# Patient Record
Sex: Female | Born: 1950 | Race: White | Hispanic: No | Marital: Married | Smoking: Never smoker
Health system: Southern US, Community
[De-identification: ages and names within clinical notes are randomized; demographics above are authoritative.]

## PROBLEM LIST (undated history)

## (undated) DIAGNOSIS — Z860100 Personal history of colon polyps, unspecified: Secondary | ICD-10-CM

## (undated) DIAGNOSIS — Z960 Presence of urogenital implants: Secondary | ICD-10-CM

## (undated) DIAGNOSIS — E782 Mixed hyperlipidemia: Secondary | ICD-10-CM

## (undated) DIAGNOSIS — N3941 Urge incontinence: Secondary | ICD-10-CM

## (undated) DIAGNOSIS — R112 Nausea with vomiting, unspecified: Secondary | ICD-10-CM

## (undated) DIAGNOSIS — D509 Iron deficiency anemia, unspecified: Secondary | ICD-10-CM

## (undated) DIAGNOSIS — S62109A Fracture of unspecified carpal bone, unspecified wrist, initial encounter for closed fracture: Secondary | ICD-10-CM

## (undated) DIAGNOSIS — I10 Essential (primary) hypertension: Secondary | ICD-10-CM

## (undated) DIAGNOSIS — R51 Headache: Secondary | ICD-10-CM

## (undated) DIAGNOSIS — K449 Diaphragmatic hernia without obstruction or gangrene: Secondary | ICD-10-CM

## (undated) DIAGNOSIS — I639 Cerebral infarction, unspecified: Secondary | ICD-10-CM

## (undated) DIAGNOSIS — H269 Unspecified cataract: Secondary | ICD-10-CM

## (undated) DIAGNOSIS — G8929 Other chronic pain: Secondary | ICD-10-CM

## (undated) DIAGNOSIS — K5790 Diverticulosis of intestine, part unspecified, without perforation or abscess without bleeding: Secondary | ICD-10-CM

## (undated) DIAGNOSIS — M1990 Unspecified osteoarthritis, unspecified site: Secondary | ICD-10-CM

## (undated) DIAGNOSIS — N393 Stress incontinence (female) (male): Secondary | ICD-10-CM

## (undated) DIAGNOSIS — K529 Noninfective gastroenteritis and colitis, unspecified: Secondary | ICD-10-CM

## (undated) DIAGNOSIS — N814 Uterovaginal prolapse, unspecified: Secondary | ICD-10-CM

## (undated) DIAGNOSIS — M8580 Other specified disorders of bone density and structure, unspecified site: Secondary | ICD-10-CM

## (undated) DIAGNOSIS — G4730 Sleep apnea, unspecified: Secondary | ICD-10-CM

## (undated) DIAGNOSIS — J302 Other seasonal allergic rhinitis: Secondary | ICD-10-CM

## (undated) DIAGNOSIS — N812 Incomplete uterovaginal prolapse: Secondary | ICD-10-CM

## (undated) DIAGNOSIS — G709 Myoneural disorder, unspecified: Secondary | ICD-10-CM

## (undated) DIAGNOSIS — Z9889 Other specified postprocedural states: Secondary | ICD-10-CM

## (undated) DIAGNOSIS — E7800 Pure hypercholesterolemia, unspecified: Secondary | ICD-10-CM

## (undated) DIAGNOSIS — M6740 Ganglion, unspecified site: Secondary | ICD-10-CM

## (undated) DIAGNOSIS — N3281 Overactive bladder: Secondary | ICD-10-CM

## (undated) DIAGNOSIS — K219 Gastro-esophageal reflux disease without esophagitis: Secondary | ICD-10-CM

## (undated) DIAGNOSIS — M549 Dorsalgia, unspecified: Secondary | ICD-10-CM

## (undated) DIAGNOSIS — K52839 Microscopic colitis, unspecified: Secondary | ICD-10-CM

## (undated) DIAGNOSIS — Z8719 Personal history of other diseases of the digestive system: Secondary | ICD-10-CM

## (undated) DIAGNOSIS — M12819 Other specific arthropathies, not elsewhere classified, unspecified shoulder: Secondary | ICD-10-CM

## (undated) HISTORY — DX: Other specified disorders of bone density and structure, unspecified site: M85.80

## (undated) HISTORY — PX: LUMBAR SPINE SURGERY: SHX701

## (undated) HISTORY — PX: TUBAL LIGATION: SHX77

## (undated) HISTORY — DX: Personal history of colon polyps, unspecified: Z86.0100

## (undated) HISTORY — DX: Cerebral infarction, unspecified: I63.9

## (undated) HISTORY — DX: Essential (primary) hypertension: I10

## (undated) HISTORY — PX: BACK SURGERY: SHX140

## (undated) HISTORY — PX: CATARACT EXTRACTION: SUR2

## (undated) HISTORY — PX: GUM SURGERY: SHX658

## (undated) HISTORY — PX: FINGER SURGERY: SHX640

## (undated) HISTORY — DX: Diverticulosis of intestine, part unspecified, without perforation or abscess without bleeding: K57.90

## (undated) HISTORY — PX: COLONOSCOPY: SHX174

## (undated) HISTORY — DX: Microscopic colitis, unspecified: K52.839

## (undated) HISTORY — DX: Pure hypercholesterolemia, unspecified: E78.00

## (undated) HISTORY — DX: Unspecified osteoarthritis, unspecified site: M19.90

## (undated) HISTORY — PX: OTHER SURGICAL HISTORY: SHX169

## (undated) HISTORY — DX: Personal history of colonic polyps: Z86.010

## (undated) HISTORY — PX: GYNECOLOGIC CRYOSURGERY: SHX857

## (undated) HISTORY — DX: Gastro-esophageal reflux disease without esophagitis: K21.9

## (undated) HISTORY — PX: JOINT REPLACEMENT: SHX530

## (undated) HISTORY — DX: Fracture of unspecified carpal bone, unspecified wrist, initial encounter for closed fracture: S62.109A

## (undated) HISTORY — DX: Unspecified cataract: H26.9

---

## 1970-04-11 HISTORY — PX: NASAL SEPTUM SURGERY: SHX37

## 1996-04-11 DIAGNOSIS — Z8673 Personal history of transient ischemic attack (TIA), and cerebral infarction without residual deficits: Secondary | ICD-10-CM

## 1996-04-11 DIAGNOSIS — I639 Cerebral infarction, unspecified: Secondary | ICD-10-CM

## 1996-04-11 HISTORY — DX: Personal history of transient ischemic attack (TIA), and cerebral infarction without residual deficits: Z86.73

## 1996-04-11 HISTORY — DX: Cerebral infarction, unspecified: I63.9

## 1997-07-14 ENCOUNTER — Other Ambulatory Visit: Admission: RE | Admit: 1997-07-14 | Discharge: 1997-07-14 | Payer: Self-pay | Admitting: Obstetrics and Gynecology

## 1998-03-19 ENCOUNTER — Ambulatory Visit (HOSPITAL_COMMUNITY): Admission: RE | Admit: 1998-03-19 | Discharge: 1998-03-19 | Payer: Self-pay

## 1998-03-27 ENCOUNTER — Encounter: Payer: Self-pay | Admitting: Neurosurgery

## 1998-03-27 ENCOUNTER — Inpatient Hospital Stay (HOSPITAL_COMMUNITY): Admission: RE | Admit: 1998-03-27 | Discharge: 1998-03-28 | Payer: Self-pay | Admitting: Neurosurgery

## 1998-04-11 HISTORY — PX: OOPHORECTOMY: SHX86

## 1998-12-18 ENCOUNTER — Encounter (INDEPENDENT_AMBULATORY_CARE_PROVIDER_SITE_OTHER): Payer: Self-pay | Admitting: Specialist

## 1998-12-18 ENCOUNTER — Observation Stay (HOSPITAL_COMMUNITY): Admission: RE | Admit: 1998-12-18 | Discharge: 1998-12-19 | Payer: Self-pay | Admitting: Obstetrics and Gynecology

## 1999-04-12 HISTORY — PX: OOPHORECTOMY: SHX86

## 1999-04-22 ENCOUNTER — Encounter: Admission: RE | Admit: 1999-04-22 | Discharge: 1999-04-22 | Payer: Self-pay | Admitting: Neurosurgery

## 1999-04-22 ENCOUNTER — Encounter: Payer: Self-pay | Admitting: Neurosurgery

## 1999-07-31 ENCOUNTER — Ambulatory Visit (HOSPITAL_COMMUNITY): Admission: RE | Admit: 1999-07-31 | Discharge: 1999-07-31 | Payer: Self-pay | Admitting: Neurology

## 1999-07-31 ENCOUNTER — Encounter: Payer: Self-pay | Admitting: Neurology

## 2001-06-29 ENCOUNTER — Encounter: Admission: RE | Admit: 2001-06-29 | Discharge: 2001-09-27 | Payer: Self-pay | Admitting: Neurosurgery

## 2002-01-24 ENCOUNTER — Ambulatory Visit (HOSPITAL_COMMUNITY): Admission: RE | Admit: 2002-01-24 | Discharge: 2002-01-24 | Payer: Self-pay | Admitting: Pulmonary Disease

## 2002-01-24 ENCOUNTER — Encounter: Payer: Self-pay | Admitting: Pulmonary Disease

## 2003-02-11 ENCOUNTER — Ambulatory Visit (HOSPITAL_COMMUNITY): Admission: RE | Admit: 2003-02-11 | Discharge: 2003-02-11 | Payer: Self-pay | Admitting: Gastroenterology

## 2004-02-16 ENCOUNTER — Ambulatory Visit: Payer: Self-pay | Admitting: Pulmonary Disease

## 2004-02-25 ENCOUNTER — Ambulatory Visit: Payer: Self-pay | Admitting: Pulmonary Disease

## 2004-06-09 DIAGNOSIS — K219 Gastro-esophageal reflux disease without esophagitis: Secondary | ICD-10-CM

## 2004-06-09 HISTORY — DX: Gastro-esophageal reflux disease without esophagitis: K21.9

## 2004-06-17 ENCOUNTER — Ambulatory Visit: Payer: Self-pay | Admitting: Pulmonary Disease

## 2004-06-22 ENCOUNTER — Ambulatory Visit (HOSPITAL_COMMUNITY): Admission: RE | Admit: 2004-06-22 | Discharge: 2004-06-22 | Payer: Self-pay | Admitting: Pulmonary Disease

## 2004-06-24 ENCOUNTER — Ambulatory Visit: Payer: Self-pay | Admitting: Gastroenterology

## 2004-06-28 ENCOUNTER — Ambulatory Visit: Payer: Self-pay | Admitting: Gastroenterology

## 2004-08-09 ENCOUNTER — Ambulatory Visit: Payer: Self-pay | Admitting: Gastroenterology

## 2004-12-21 ENCOUNTER — Ambulatory Visit: Payer: Self-pay | Admitting: Pulmonary Disease

## 2005-06-21 ENCOUNTER — Ambulatory Visit: Payer: Self-pay | Admitting: Pulmonary Disease

## 2005-06-29 ENCOUNTER — Ambulatory Visit: Payer: Self-pay | Admitting: Pulmonary Disease

## 2005-12-05 ENCOUNTER — Ambulatory Visit: Payer: Self-pay | Admitting: Pulmonary Disease

## 2005-12-14 ENCOUNTER — Ambulatory Visit: Payer: Self-pay | Admitting: Pulmonary Disease

## 2006-02-23 ENCOUNTER — Ambulatory Visit: Payer: Self-pay | Admitting: Specialist

## 2006-04-11 HISTORY — PX: KNEE ARTHROSCOPY: SUR90

## 2006-04-27 ENCOUNTER — Ambulatory Visit: Payer: Self-pay | Admitting: Pulmonary Disease

## 2006-06-05 ENCOUNTER — Ambulatory Visit: Payer: Self-pay | Admitting: Pulmonary Disease

## 2006-07-07 ENCOUNTER — Ambulatory Visit: Payer: Self-pay | Admitting: Specialist

## 2006-07-12 ENCOUNTER — Ambulatory Visit: Payer: Self-pay | Admitting: Specialist

## 2006-07-12 ENCOUNTER — Other Ambulatory Visit: Payer: Self-pay

## 2006-07-19 ENCOUNTER — Ambulatory Visit: Payer: Self-pay | Admitting: Specialist

## 2006-12-04 ENCOUNTER — Ambulatory Visit: Payer: Self-pay | Admitting: Pulmonary Disease

## 2007-01-05 ENCOUNTER — Ambulatory Visit: Payer: Self-pay | Admitting: Pulmonary Disease

## 2007-01-05 LAB — CONVERTED CEMR LAB
AST: 48 units/L — ABNORMAL HIGH (ref 0–37)
BUN: 16 mg/dL (ref 6–23)
Bilirubin, Direct: 0.1 mg/dL (ref 0.0–0.3)
Calcium: 9.8 mg/dL (ref 8.4–10.5)
Glucose, Bld: 114 mg/dL — ABNORMAL HIGH (ref 70–99)
HDL: 30.9 mg/dL — ABNORMAL LOW (ref >39.0)
Hgb A1c MFr Bld: 6.2 % — ABNORMAL HIGH (ref 4.6–6.0)
Sodium: 143 meq/L (ref 135–145)
Total Bilirubin: 1 mg/dL (ref 0.3–1.2)
Total CHOL/HDL Ratio: 4.8
Total Protein: 6.9 g/dL (ref 6.0–8.3)

## 2007-01-08 DIAGNOSIS — I872 Venous insufficiency (chronic) (peripheral): Secondary | ICD-10-CM | POA: Insufficient documentation

## 2007-01-08 DIAGNOSIS — F411 Generalized anxiety disorder: Secondary | ICD-10-CM | POA: Insufficient documentation

## 2007-01-08 DIAGNOSIS — M722 Plantar fascial fibromatosis: Secondary | ICD-10-CM

## 2007-01-08 DIAGNOSIS — K219 Gastro-esophageal reflux disease without esophagitis: Secondary | ICD-10-CM

## 2007-01-08 DIAGNOSIS — I10 Essential (primary) hypertension: Secondary | ICD-10-CM

## 2007-01-08 DIAGNOSIS — M1990 Unspecified osteoarthritis, unspecified site: Secondary | ICD-10-CM | POA: Insufficient documentation

## 2007-02-16 ENCOUNTER — Ambulatory Visit: Payer: Self-pay | Admitting: Pulmonary Disease

## 2007-02-17 DIAGNOSIS — E785 Hyperlipidemia, unspecified: Secondary | ICD-10-CM | POA: Insufficient documentation

## 2007-02-17 DIAGNOSIS — D126 Benign neoplasm of colon, unspecified: Secondary | ICD-10-CM | POA: Insufficient documentation

## 2007-03-16 ENCOUNTER — Ambulatory Visit: Payer: Self-pay | Admitting: Pulmonary Disease

## 2007-06-04 ENCOUNTER — Ambulatory Visit: Payer: Self-pay | Admitting: Pulmonary Disease

## 2007-06-06 ENCOUNTER — Ambulatory Visit: Payer: Self-pay | Admitting: Pulmonary Disease

## 2007-06-15 ENCOUNTER — Telehealth (INDEPENDENT_AMBULATORY_CARE_PROVIDER_SITE_OTHER): Payer: Self-pay | Admitting: *Deleted

## 2007-06-20 ENCOUNTER — Ambulatory Visit: Payer: Self-pay | Admitting: Pulmonary Disease

## 2007-06-26 ENCOUNTER — Telehealth (INDEPENDENT_AMBULATORY_CARE_PROVIDER_SITE_OTHER): Payer: Self-pay | Admitting: *Deleted

## 2007-08-29 ENCOUNTER — Telehealth: Payer: Self-pay | Admitting: Gastroenterology

## 2007-09-12 ENCOUNTER — Encounter: Payer: Self-pay | Admitting: Pulmonary Disease

## 2007-09-21 ENCOUNTER — Ambulatory Visit: Payer: Self-pay | Admitting: Gastroenterology

## 2007-09-21 ENCOUNTER — Encounter: Payer: Self-pay | Admitting: Pulmonary Disease

## 2007-09-25 ENCOUNTER — Ambulatory Visit: Payer: Self-pay | Admitting: Pulmonary Disease

## 2007-09-28 ENCOUNTER — Encounter: Payer: Self-pay | Admitting: Gastroenterology

## 2007-09-28 ENCOUNTER — Ambulatory Visit: Payer: Self-pay | Admitting: Gastroenterology

## 2007-09-30 DIAGNOSIS — R930 Abnormal findings on diagnostic imaging of skull and head, not elsewhere classified: Secondary | ICD-10-CM | POA: Insufficient documentation

## 2007-10-02 ENCOUNTER — Encounter: Payer: Self-pay | Admitting: Gastroenterology

## 2008-03-21 ENCOUNTER — Telehealth (INDEPENDENT_AMBULATORY_CARE_PROVIDER_SITE_OTHER): Payer: Self-pay | Admitting: *Deleted

## 2008-03-26 ENCOUNTER — Ambulatory Visit: Payer: Self-pay | Admitting: Pulmonary Disease

## 2008-03-26 ENCOUNTER — Encounter: Payer: Self-pay | Admitting: Adult Health

## 2008-04-14 ENCOUNTER — Telehealth (INDEPENDENT_AMBULATORY_CARE_PROVIDER_SITE_OTHER): Payer: Self-pay | Admitting: *Deleted

## 2008-09-24 ENCOUNTER — Ambulatory Visit: Payer: Self-pay | Admitting: Obstetrics and Gynecology

## 2008-12-09 ENCOUNTER — Ambulatory Visit: Payer: Self-pay | Admitting: Pulmonary Disease

## 2009-03-09 ENCOUNTER — Telehealth: Payer: Self-pay | Admitting: Pulmonary Disease

## 2009-04-20 ENCOUNTER — Telehealth: Payer: Self-pay | Admitting: Pulmonary Disease

## 2009-04-20 ENCOUNTER — Ambulatory Visit: Payer: Self-pay | Admitting: Pulmonary Disease

## 2009-06-09 ENCOUNTER — Ambulatory Visit: Payer: Self-pay | Admitting: Pulmonary Disease

## 2009-06-30 ENCOUNTER — Telehealth (INDEPENDENT_AMBULATORY_CARE_PROVIDER_SITE_OTHER): Payer: Self-pay | Admitting: *Deleted

## 2009-09-25 ENCOUNTER — Ambulatory Visit: Payer: Self-pay | Admitting: Obstetrics and Gynecology

## 2009-10-13 ENCOUNTER — Ambulatory Visit: Payer: Self-pay | Admitting: Obstetrics and Gynecology

## 2009-11-11 ENCOUNTER — Ambulatory Visit: Payer: Self-pay | Admitting: Obstetrics and Gynecology

## 2009-11-16 ENCOUNTER — Ambulatory Visit: Payer: Self-pay | Admitting: Pulmonary Disease

## 2009-11-16 ENCOUNTER — Telehealth (INDEPENDENT_AMBULATORY_CARE_PROVIDER_SITE_OTHER): Payer: Self-pay | Admitting: *Deleted

## 2009-11-17 DIAGNOSIS — R51 Headache: Secondary | ICD-10-CM

## 2009-11-17 DIAGNOSIS — R519 Headache, unspecified: Secondary | ICD-10-CM | POA: Insufficient documentation

## 2009-12-07 ENCOUNTER — Ambulatory Visit: Payer: Self-pay | Admitting: Pulmonary Disease

## 2009-12-23 ENCOUNTER — Telehealth: Payer: Self-pay | Admitting: Pulmonary Disease

## 2009-12-23 DIAGNOSIS — M25519 Pain in unspecified shoulder: Secondary | ICD-10-CM

## 2010-05-11 NOTE — Assessment & Plan Note (Signed)
Summary: Acute NP office visit - recent fall   CC:  fell on Thursday at home and hit back of head and left shoulder.  having progressing dull HA and nausea.  History of Present Illness: 60  y/o WF   -06/20/07 for a routine appt, she was c/o cough, congestion, and yellow sputum... eval revealed a bronchitic infection and she was treated w/ Avelox & Medrol... Improved, but she is exposed to second hand smoke from her husb... CXR revealed a 56m RUL nodule and f/u film in 3 months was rec...   6/09--seen for follow up. CXR without change.   March 26, 2008 --returns for follow up and fasitng labs. Recent cold symptoms. started on zpack. finished yesterday. Feeling better, still has some drainage. yellow mucus.     ~  June 09, 2009:  routine 646mo/u doing well- had recent sinus infection Rx w/ Avelox, Astepro per ENT... hx sm nodule w/ no progression serially & today's CXR similar (can't see it)... BP controlled on meds;  Chol has been OK on diet + Simva20;  hasn't been effective w/ diet/ ex "I'm working on it";  mult somatic complaints improved by regular massage therapy..  November 16, 2009 --Pt presents for work in visit for headache. Pt fell on 4 days ago  home, hit back of head and left shoulder on edge of counter and then landed on flooor. Complains of  having progressing dull HA and nausea. Was standing on step stool, went to step down missed step fell over into counter then onto floor. She did not lose consciousness. No visual/speech changes, or ext weakness.  Complains of soreness along posterior scalp. Skin intact. She has gotten couple of massages and went to chiropractor today. Denies chest pain, dyspnea, orthopnea, hemoptysis, fever, n/v/d, edema, headache,recent travel.       Medications Prior to Update: 1)  Aspir-Low 81 Mg Tbec (Aspirin) ...Marland Kitchen 1 Tablet By Mouth Two Times A Day 2)  Atenolol-Chlorthalidone 50-25 Mg Tabs (Atenolol-Chlorthalidone) .... Take 1 Tab By Mouth Once  Daily...Marland KitchenMarland Kitchen)  Zestril 2.5 Mg  Tabs (Lisinopril) .... Take 1 Tab By Mouth Once Daily...Marland KitchenMarland Kitchen)  Klor-Con M20 20 Meq Tbcr (Potassium Chloride Crys Cr) ...Marland Kitchen 1 Tab By Mouth Two Times A Day 5)  Simvastatin 20 Mg  Tabs (Simvastatin) .... Take 1 Tab By Mouth At Bedtime...Marland KitchenMarland Kitchen)  Omeprazole 40 Mg Cpdr (Omeprazole) .... Take 1 Tablet By Mouth Once A Day 7)  Etodolac 400 Mg Tabs (Etodolac) ...Marland Kitchen 1 By Mouth Two Times A Day 8)  Calcium-Vitamin D 500-125 Mg-Unit Tabs (Calcium-Vitamin D) .... Take 1 Tablet By Mouth Once A Day 9)  Vitamin D 1000 Unit Tabs (Cholecalciferol) .... Take 1 Tablet By Mouth Once A Day 10)  Astepro 0.15 % Soln (Azelastine Hcl) .... As Needed  Current Medications (verified): 1)  Aspir-Low 81 Mg Tbec (Aspirin) ...Marland Kitchen 1 Tablet By Mouth Two Times A Day 2)  Atenolol-Chlorthalidone 50-25 Mg Tabs (Atenolol-Chlorthalidone) .... Take 1 Tab By Mouth Once Daily...Marland KitchenMarland Kitchen)  Zestril 2.5 Mg  Tabs (Lisinopril) .... Take 1 Tab By Mouth Once Daily...Marland KitchenMarland Kitchen)  Klor-Con M20 20 Meq Tbcr (Potassium Chloride Crys Cr) ...Marland Kitchen 1 Tab By Mouth Two Times A Day 5)  Simvastatin 20 Mg  Tabs (Simvastatin) .... Take 1 Tab By Mouth At Bedtime...Marland KitchenMarland Kitchen)  Omeprazole 40 Mg Cpdr (Omeprazole) .... Take 1 Tablet By Mouth Once A Day 7)  Etodolac 400 Mg Tabs (Etodolac) ...Marland Kitchen 1 By Mouth Two Times A Day  8)  Calcium-Vitamin D 500-125 Mg-Unit Tabs (Calcium-Vitamin D) .... Take 1 Tablet By Mouth Once A Day 9)  Vitamin D 1000 Unit Tabs (Cholecalciferol) .... Take 1 Tablet By Mouth Once A Day 10)  Astepro 0.15 % Soln (Azelastine Hcl) .... As Needed 11)  Multivitamins   Tabs (Multiple Vitamin) .... Take 1 Tablet By Mouth Once A Day 12)  Magnesium 250 Mg Tabs (Magnesium) .... Take 1 Tablet By Mouth Two Times A Day 13)  Fish Oil 1000 Mg Caps (Omega-3 Fatty Acids) .... Take 1 Capsule By Mouth Two Times A Day 14)  Osteo Bi-Flex Regular Strength 250-200 Mg Tabs (Glucosamine-Chondroitin) .... Take 1 Tablet By Mouth Once A Day 15)  Coenzyme Q10 100 Mg Caps  (Coenzyme Q10) .... Take 1 Capsule By Mouth Two Times A Day  Allergies (verified): No Known Drug Allergies  Past History:  Past Medical History: Last updated: 06/09/2009  ABNORMAL CHEST XRAY (ICD-793.1) HYPERTENSION (ICD-401.9) VENOUS INSUFFICIENCY (ICD-459.81) HYPERLIPIDEMIA (ICD-272.4) DIABETES MELLITUS, BORDERLINE (ICD-790.29) OBESITY (ICD-278.00) GASTROESOPHAGEAL REFLUX DISEASE (ICD-530.81) COLONIC POLYPS (ICD-211.3) DEGENERATIVE JOINT DISEASE (ICD-715.90) FASCIITIS, PLANTAR (ICD-728.71) ANXIETY (ICD-300.00)  Past Surgical History: Last updated: 06/09/2009 S/P oopherectomy S/P lumbar laminectomy x 2  S/P left knee arthroscopy  Family History: Last updated: 2008/12/17 Father died age 33 w/ heart dis, arrhythmia, DM, smoker. Mother died age 71 w/ pneumonia, dementia. 1 Sister in her 19's- hx unknown several aunts & cousins w/ DM  Social History: Last updated: 12-17-2008 married- husb= Allison, 29 yrs 4 children--2 are step-children never smoked drinks--1-2 a month retired from [employer]  Risk Factors: Smoking Status: never (01/08/2007)  Review of Systems      See HPI  Vital Signs:  Patient profile:   60 year old female Height:      63 inches Weight:      208 pounds BMI:     36.98 O2 Sat:      95 % on Room air Temp:     97.9 degrees F oral Pulse rate:   72 / minute BP sitting:   124 / 76  (left arm) Cuff size:   regular  Vitals Entered By: Parke Poisson CNA/MA (November 16, 2009 4:50 PM)  O2 Flow:  Room air CC: fell on Thursday at home, hit back of head and left shoulder.  having progressing dull HA and nausea Is Patient Diabetic? No Comments Medications reviewed with patient Daytime contact number verified with patient. Parke Poisson CNA/MA  November 16, 2009 4:50 PM    Physical Exam  Additional Exam:  WD, 60  y/o WF in NAD... GENERAL:  Alert & oriented; pleasant & cooperative... HEENT:  [place]/AT, EOM-wnl, PERRLA, EACs-clear, TMs-wnl, NOSE-clear,  THROAT-clear & wnl. NECK:  Supple w/ fairROM; no JVD; normal carotid impulses w/o bruits; no thyromegaly or nodules palpated; no lymphadenopathy. CHEST:   Clear without wheezing, rales, rhonchi heard... HEART:  Regular Rhythm; without murmurs/ rubs/ or gallops detected... ABDOMEN:  Obese, soft & nontender; normal bowel sounds; no organomegaly or masses detected. EXT: without deformities, mild arthritic changes; no varicose veins/ +venous insuffic & chr changes/tr  edema. NEURO:  CN's intact; no focal neuro deficits...MAEWx4, equal strength bilaterally, nml gait, alternating finger movements nml. nml grips.  DERM:  No lesions noted; no rash etc...    Impression & Recommendations:  Problem # 1:  HEADACHE (ICD-784.0)  s/p fall . There was no LOC. Her neuro exam is unremarkable.  Advised her to watch for severe headache, vomitting , neuro changes.  REC:  Warm heat to neck  Tylenol as needed pain.  If headache is not improving , call back for sooner follow up  follow up Dr. Lenna Gilford in 2 weeks as scheduled. and as needed  Please contact office for sooner follow up if symptoms do not improve or worsen  Her updated medication list for this problem includes:    Aspir-low 81 Mg Tbec (Aspirin) .Marland Kitchen... 1 tablet by mouth two times a day    Atenolol-chlorthalidone 50-25 Mg Tabs (Atenolol-chlorthalidone) .Marland Kitchen... Take 1 tab by mouth once daily...    Etodolac 400 Mg Tabs (Etodolac) .Marland Kitchen... 1 by mouth two times a day  Orders: Est. Patient Level III (25427)  Medications Added to Medication List This Visit: 1)  Multivitamins Tabs (Multiple vitamin) .... Take 1 tablet by mouth once a day 2)  Magnesium 250 Mg Tabs (Magnesium) .... Take 1 tablet by mouth two times a day 3)  Fish Oil 1000 Mg Caps (Omega-3 fatty acids) .... Take 1 capsule by mouth two times a day 4)  Osteo Bi-flex Regular Strength 250-200 Mg Tabs (Glucosamine-chondroitin) .... Take 1 tablet by mouth once a day 5)  Coenzyme Q10 100 Mg Caps  (Coenzyme q10) .... Take 1 capsule by mouth two times a day  Complete Medication List: 1)  Aspir-low 81 Mg Tbec (Aspirin) .Marland Kitchen.. 1 tablet by mouth two times a day 2)  Atenolol-chlorthalidone 50-25 Mg Tabs (Atenolol-chlorthalidone) .... Take 1 tab by mouth once daily.Marland KitchenMarland Kitchen 3)  Zestril 2.5 Mg Tabs (Lisinopril) .... Take 1 tab by mouth once daily.Marland KitchenMarland Kitchen 4)  Klor-con M20 20 Meq Tbcr (Potassium chloride crys cr) .Marland Kitchen.. 1 tab by mouth two times a day 5)  Simvastatin 20 Mg Tabs (Simvastatin) .... Take 1 tab by mouth at bedtime.Marland KitchenMarland Kitchen 6)  Omeprazole 40 Mg Cpdr (Omeprazole) .... Take 1 tablet by mouth once a day 7)  Etodolac 400 Mg Tabs (Etodolac) .Marland Kitchen.. 1 by mouth two times a day 8)  Calcium-vitamin D 500-125 Mg-unit Tabs (Calcium-vitamin d) .... Take 1 tablet by mouth once a day 9)  Vitamin D 1000 Unit Tabs (Cholecalciferol) .... Take 1 tablet by mouth once a day 10)  Astepro 0.15 % Soln (Azelastine hcl) .... As needed 11)  Multivitamins Tabs (Multiple vitamin) .... Take 1 tablet by mouth once a day 12)  Magnesium 250 Mg Tabs (Magnesium) .... Take 1 tablet by mouth two times a day 13)  Fish Oil 1000 Mg Caps (Omega-3 fatty acids) .... Take 1 capsule by mouth two times a day 14)  Osteo Bi-flex Regular Strength 250-200 Mg Tabs (Glucosamine-chondroitin) .... Take 1 tablet by mouth once a day 15)  Coenzyme Q10 100 Mg Caps (Coenzyme q10) .... Take 1 capsule by mouth two times a day  Patient Instructions: 1)  Warm heat to neck  2)  Tylenol as needed pain.  3)  If headache is not improving , call back for sooner follow up  4)  follow up Dr. Lenna Gilford in 2 weeks as scheduled. and as needed  5)  Please contact office for sooner follow up if symptoms do not improve or worsen

## 2010-05-11 NOTE — Progress Notes (Signed)
Summary: lab results  Phone Note Call from Patient Call back at [phone]   Caller: Patient Call For: nadel Summary of Call: Wants lab results. Initial call taken by: Netta Neat,  June 30, 2009 10:29 AM  Follow-up for Phone Call        forwarded to dr nadel Follow-up by: Leanna Sato. Elsworth Soho MD,  June 30, 2009 11:03 AM  Additional Follow-up for Phone Call Additional follow up Details #1::        Please advise if lab results are available? [place] Bing CMA  June 30, 2009 2:39 PM'   called and spoke with pt about her lab results per SN--cxr was clear, chol is good on simvastatin so she will cont this,  BS is normal at 91 and a1c is 5.8 on diet alone.  all labs from [facility] are within normal limits.  pt voiced her understanding. Elita Boone [facility]  June 30, 2009 4:46 PM

## 2010-05-11 NOTE — Progress Notes (Signed)
Summary: Requesting pain med and muscle relaxer  Phone Note Call from Patient Call back at [phone]   Caller: Patient Reason for Call: Talk to Nurse Summary of Call: Neck & back pain/shoulder and her chirop.  told her to call SN/  can't lay in bed at night, can't rest .  In pain constantly.  Can she get muscle relaxer or pain meds? [facility] Initial call taken by: Zigmund Gottron,  December 23, 2009 11:07 AM  Follow-up for Phone Call        Called, spoke with pt.  She states she fell a few weeks ago.  Since  Saturday having "exruicating pain" in shoulders and neck.  Pain is sharp.  Is seeing chiropracter, Dr. Estill Batten, and message therapist.  Dr. Bobby Rumpf told pt she should call SN to see if he will give her pain meds and muscle relaxers.   [facility] Dr. Lenna Gilford, pls advise.  Thanks! Follow-up by: Raymondo Band RN,  December 23, 2009 11:17 AM  Additional Follow-up for Phone Call Additional follow up Details #1::        per SN---she will need ortho eval ASAP and his order has been put in already---and we can call her in some vicodin 5/500  #50  1 by mouth three times a day as needed for  pain and robaxin 569m   take one tablet by mouth three times a day as needed for muscle spasms  with no refills to either meds.  thanks [place] December 23, 2009 11:40 AM   New Problems: SHOULDER PAIN, BILATERAL (ICD-719.41)   Additional Follow-up for Phone Call Additional follow up Details #2::    pt advised of referral and rx. rx sent. JRandolph BingCMA  December 23, 2009 11:47 AM   New Problems: SHOULDER PAIN, BILATERAL (ICD-719.41) New/Updated Medications: VICODIN 5-500 MG TABS (HYDROCODONE-ACETAMINOPHEN) take one tablet three times daily as needed for pain ROBAXIN 500 MG TABS (METHOCARBAMOL) take one tablet by mouth three times a day as needed spasms Prescriptions: ROBAXIN 500 MG TABS (METHOCARBAMOL) take one tablet by mouth three times a day as needed spasms  #50  x 0   Entered by:   JRandolph BingCMA   Authorized by:   SNoralee SpaceMD   Signed by:   JRandolph BingCMA on 12/23/2009   Method used:   Telephoned to ...       [phone]       [address]      [place]  [address]      Ph: [phone] [phone]      Fax: [phone]  RxID:   [id number] 5-500 MG TABS (HYDROCODONE-ACETAMINOPHEN) take one tablet three times daily as needed for pain  #50 x 0   Entered by:   JRandolph BingCMA   Authorized by:   SNoralee SpaceMD   Signed by:   JRandolph BingCMA on 12/23/2009   Method used:   Telephoned to ...       [facility][phone] (retail)       [address]      [place]  [address]      Ph: [phone] [phone]      Fax: [phone]  RxID:   [id number]

## 2010-05-11 NOTE — Progress Notes (Signed)
Summary: results-ATC-NA  Phone Note Call from Patient   Caller: Patient Call For: Aribella Vavra Summary of Call: pt wants results asap re: strep test. call cell [phone] Initial call taken by: Cooper Render,  April 20, 2009 12:43 PM  Follow-up for Phone Call        ATC pt at number given; unable to leave message on voicemail. try again later. Clayborne Dana CMA  April 20, 2009 12:48 PM     per SN----negative strep test---ok for pt to have mmw  #4oz   1 tsp   swish and swallow four times daily as needed .  thanks [place]  April 20, 2009 3:00 PM   Additional Follow-up for Phone Call Additional follow up Details #1::        rx sent. pt aware.[place] Bing CMA  April 20, 2009 4:54 PM     New/Updated Medications: * MAGIC MOUTHWASH 1 tsp Swish and swallow four times a day Prescriptions: MAGIC MOUTHWASH 1 tsp Swish and swallow four times a day  #4 oz x 0   Entered by:   Graham Bing CMA   Authorized by:   Noralee Space MD   Signed by:   [place] Bing CMA on 04/20/2009   Method used:   Telephoned to ...       [facility] [phone]* (retail)       [address]       [place],   [address]       Ph: [phone] or [phone]       Fax: [phone]   RxID:   [id number]

## 2010-05-11 NOTE — Progress Notes (Signed)
Summary: strep test  Phone Note Call from Patient Call back at [facility] Phone [phone] Call back at [phone] (cell)   Caller: Patient Call For: nadel Reason for Call: Talk to Nurse Summary of Call: sore throat since Wed.  Can she come by and have strep test done? Initial call taken by: Zigmund Gottron,  April 20, 2009 8:58 AM  Follow-up for Phone Call        PT had same thing going on back in November and was given an RX; however pt would like to come in today to get strep test done, please advise if this is ok to order on pt. Clayborne Dana CMA  April 20, 2009 9:01 AM     ok for pt to come in for strep test.  thanks [place]  April 20, 2009 9:12 AM   Additional Follow-up for Phone Call Additional follow up Details #1::        Orders in computer. Pt aware of orders in. Will call with results asap.Clayborne Dana CMA  April 20, 2009 9:16 AM

## 2010-05-11 NOTE — Assessment & Plan Note (Signed)
Summary: rov w/ cxr ///kp   CC:  6 month ROV & review of mult medical problems....  History of Present Illness: 60 y/o WF here for a follow up visit... she has multiple medical problems as noted below...   ~  seen 3/09 c/o cough, congestion, and yellow sputum... eval revealed a bronchitic infection and she was treated w/ Avelox & Medrol... she is a never smoker, but exposed to second hand smoke from her husb... CXR revealed a 2m RUL nodule and serial films have shown no signif change...   ~  June 09, 2009:  routine 672mo/u doing well- had recent sinus infection Rx w/ Avelox, Astepro per ENT... hx sm nodule w/ no progression serially & today's CXR similar (can't see it)... BP controlled on meds;  Chol has been OK on diet + Simva20;  hasn't been effective w/ diet/ ex "I'm working on it";  mult somatic complaints improved by regular massage therapy...    Current Problem List:  ABNORMAL CHEST XRAY (ICD-793.1) - CXR 3/09 w/ 34m20mUL nodule seen... serial films showed no growth & it was difficult to see at all on 12/09 CXR... nonsmoker, exposed to husb second hand smoke.  ~  f/u CXR 12/09 showed nodule ?obscured by ribs, NAD...  HYPERTENSION (ICD-401.9) - controlled on TENORETIC 50/25 daily, ZESTRIL 2.5mg80m K20Bid... BP's at home are all in the 120/80 range and BP= 114/70 today... she is taking meds regularly and tol well... denies HA, visual changes, CP, palipit, dizziness, syncope, dyspnea, fatigue, edema, etc...   VENOUS INSUFFICIENCY (ICD-459.81) - she tries to follow a sodium restricted diet... she has VI and chr ven insuffic changes/ chr edema... we discussed 2 gm Na+ restrictions and TED hose... her weight is up 4# from last visit...  HYPERLIPIDEMIA (ICD-272.4) - on SIMVASTATIN 20mg68m diet efforts...  ~  labs 9/08 on Simva20 showed TChol 148, TG 118, HDL 31, LDL 94  ~  [facility] Sim[facility] showed TChol 234, TG 151, HDL 34, LDL 170... rec> restart ZOCOR.  ~  [facility] TChol 157, TG 106, HDL 40, LDL 96  ~  [facility] T[facility]ol =   DIABETES MELLITUS, BORDERLINE (ICD-790.29) - +FamHx DM and borderline labs in past...  ~  labs 9/08 (wt= 206#) showed BS= 114, A1c= 6.2 on diet alone...  ~  [facility]  ~  [facility] BS=   OBESITY (ICD-278.00) - weight stable at about 200#... needs better diet and exercise program and we discussed this today...  GASTROESOPHAGEAL REFLUX DISEASE (ICD-530.81) - on ACIPHEX 20mg/2m she is symptomatic if she stops this med... last EGD was 3/06 showing HH and gastritis...   COLONIC POLYPS (ICD-211.3)  ~  colonoscopy 10/04 by DrStark showed several 2mm po22ms (bx=minute frag of colonic mucosa only)...  ~  colonoscopy 6/09 showed mult 3-4mm pol21m= hyperplastic on bx, +melanosis coli... f/u 66yrs.  D85yrERATIVE JOINT DISEASE (ICD-715.90) - takes ETODOLAC 400mg/d as26mded...  ~  she sees a chiropractor monthly, and massage therapist every 2-3 weeks (helps better than anything).  FASCIITIS, PLANTAR (ICD-728.71) - she had "sonic wave" therapy from podiatry which helped somewhat...  ANXIETY (ICD-300.00)   Allergies (verified): No Known Drug Allergies  Comments:  Nurse/Medical Assistant: The patient's medications and allergies were reviewed with the patient and were updated in the Medication and Allergy Lists.  Past History:  Past Medical History:  ABNORMAL CHEST XRAY (NLG-921.1) HYPERTENSION (ICD-401.9) VENOUS INSUFFICIENCY (ICD-459.81) HYPERLIPIDEMIA (ICD-272.4) DIABETES MELLITUS, BORDERLINE (ICD-790.29) OBESITY (ICD-278.00) GASTROESOPHAGEAL REFLUX DISEASE (ICD-530.81) COLONIC POLYPS (ICD-211.3) DEGENERATIVE JOINT DISEASE (ICD-715.90) FASCIITIS, PLANTAR (ICD-728.71) ANXIETY (ICD-300.00)  Past Surgical History: S/P oopherectomy S/P lumbar laminectomy x 2  S/P left knee arthroscopy  Family History: Reviewed history from  12/09/2008 and no changes required. Father died age 59 w/ heart dis, arrhythmia, DM, smoker. Mother died age 60 w/ pneumonia, dementia. 1 Sister in her 32's- hx unknown several aunts & cousins w/ DM  Social History: Reviewed history from 12/09/2008 and no changes required. married- husb= Orlinda, 29 yrs 4 children--2 are step-children never smoked drinks--1-2 a month retired from [employer]      See HPI  The patient denies anorexia, fever, weight loss, weight gain, vision loss, decreased hearing, hoarseness, chest pain, syncope, dyspnea on exertion, peripheral edema, prolonged cough, headaches, hemoptysis, abdominal pain, melena, hematochezia, severe indigestion/heartburn, hematuria, incontinence, muscle weakness, suspicious skin lesions, transient blindness, difficulty walking, depression, unusual weight change, abnormal bleeding, enlarged lymph nodes, and angioedema.    Vital Signs:  Patient profile:   60 year old female Height:      63 inches Weight:      204.13 pounds O2 Sat:      98 % on Room air Temp:     96.9 degrees F oral Pulse rate:   72 / minute BP sitting:   114 / 70  (right arm) Cuff size:   regular  Vitals Entered By: Elita Boone CMA (June 09, 2009 11:22 AM)  O2 Sat at Rest %:  98 O2 Flow:  Room air CC: 6 month ROV & review of mult medical problems... Is Patient Diabetic? Yes Pain Assessment Patient in pain? no      Comments meds updated today   Physical Exam  Additional Exam:  WD, Overweight, 60 y/o WF in NAD... GENERAL:  Alert & oriented; pleasant & cooperative... HEENT:  [place]/AT, EOM-wnl, PERRLA, EACs-clear, TMs-wnl, NOSE-clear, THROAT-clear & wnl. NECK:  Supple w/ fairROM; no JVD; normal carotid impulses w/o bruits; no thyromegaly or nodules palpated; no lymphadenopathy. CHEST:   Clear without wheezing, rales, rhonchi heard... HEART:  Regular Rhythm; without murmurs/ rubs/ or gallops detected... ABDOMEN:  Obese, soft & nontender; normal  bowel sounds; no organomegaly or masses detected. EXT: without deformities, mild arthritic changes; no varicose veins/ +venous insuffic & chr changes/ 1+ edema. NEURO:  CN's intact; no focal neuro deficits... DERM:  No lesions noted; no rash etc...    CXR  Procedure date:  06/09/2009  Findings:      CHEST - 2 VIEW Comparison: [place] chest x-ray 03/26/2008, 06/20/2007 and 09/25/2007.   Findings: No right upper lobe subpleural nodule is seen.  Lungs appear clear.  Heart size configuration appear normal. Mediastinum, hila, pleura and degenerative changes dorsal spine stable and otherwise unremarkable.   IMPRESSION: Stable.  No active disease.   Read By:  Sydnee Levans,  M.D.   Impression & Recommendations:  Problem # 1:  ABNORMAL CHEST XRAY (JHE-174.1) No nodule seen on plain CXR... she is asymptomatic.  Problem # 2:  HYPERTENSION (ICD-401.9) Controlled on meds... continue same. Her updated medication list for this problem includes:    Atenolol-chlorthalidone 50-25 Mg Tabs (Atenolol-chlorthalidone) .Marland Kitchen... Take 1 tab by mouth once daily...    Zestril 2.5 Mg Tabs (Lisinopril) .Marland Kitchen... Take 1 tab by mouth once daily...  Orders: T-2 View CXR ([id number])  Problem # 3:  VENOUS  INSUFFICIENCY (ICD-459.81) Reminded regarding no salt, elevate legs, support hose Prn.  Problem # 4:  HYPERLIPIDEMIA (JFT-953.4) Follow up FLP at [place] - pending. (She has [employer]). Her updated medication list for this problem includes:    Simvastatin 20 Mg Tabs (Simvastatin) .Marland Kitchen... Take 1 tab by mouth at bedtime...  Problem # 5:  DIABETES MELLITUS, BORDERLINE (ICD-790.29) Labs pending as noted-  we will review when avail & notify pt of results...  Problem # 6:  OBESITY (ICD-278.00) Diet + exercise are the keys to success...  Problem # 7:  GASTROESOPHAGEAL REFLUX DISEASE (ICD-530.81) GI is stable... Her updated medication list for this problem includes:    Omeprazole 40  Mg Cpdr (Omeprazole) .Marland Kitchen... Take 1 tablet by mouth once a day  Problem # 8:  OTHER MEDICAL PROBLEMS AS NOTED>>>  Complete Medication List: 1)  Aspir-low 81 Mg Tbec (Aspirin) .Marland Kitchen.. 1 tablet by mouth two times a day 2)  Atenolol-chlorthalidone 50-25 Mg Tabs (Atenolol-chlorthalidone) .... Take 1 tab by mouth once daily.Marland KitchenMarland Kitchen 3)  Zestril 2.5 Mg Tabs (Lisinopril) .... Take 1 tab by mouth once daily.Marland KitchenMarland Kitchen 4)  Klor-con M20 20 Meq Tbcr (Potassium chloride crys cr) .Marland Kitchen.. 1 tab by mouth two times a day 5)  Simvastatin 20 Mg Tabs (Simvastatin) .... Take 1 tab by mouth at bedtime.Marland KitchenMarland Kitchen 6)  Omeprazole 40 Mg Cpdr (Omeprazole) .... Take 1 tablet by mouth once a day 7)  Etodolac 400 Mg Tabs (Etodolac) .Marland Kitchen.. 1 by mouth two times a day 8)  Calcium-vitamin D 500-125 Mg-unit Tabs (Calcium-vitamin d) .... Take 1 tablet by mouth once a day 9)  Vitamin D 1000 Unit Tabs (Cholecalciferol) .... Take 1 tablet by mouth once a day 10)  Astepro 0.15 % Soln (Azelastine hcl) .... As needed  Patient Instructions: 1)  Today we updated your med list- see below.... 2)  Continue your current meds the same... 3)  Today we did your follow up CXR & FASTING blood work (sent to [facility](... please call the "phone tree" in a few days for your lab results.Marland KitchenMarland Kitchen 4)  Let's get on track w/ our diet + exercise program... the goal is to lose 15-20 lbs!!! 5)  Call for any questions.Marland KitchenMarland Kitchen 6)  Please schedule a follow-up appointment in 6 months.

## 2010-05-11 NOTE — Progress Notes (Signed)
Summary: recent fall  Phone Note Call from Patient Call back at [facility] Phone [phone]   Caller: Patient Call For: nadel Summary of Call: pt fell last thursday. hit her shoulder/ back/ head. has had headaches "off and on since". did not go to ed as her family had recommended. has had nausea (no vomiting). no blurred vision, however, pt says head doesn't hurt as bad as it did. concerned also about her eyes. says that monday a wk ago she saw flashes of light and had a headache as well. pt wants to know if she should see nadel. she is seeing her chiropractor today (who is across the street). call home # or cell [phone] Initial call taken by: Cooper Render, CNA,  November 16, 2009 11:11 AM  Follow-up for Phone Call        Called, spoke with pt.  Pt states 1 wk ago she had flashes of light but was seen by eye dr for this.    Pt also states she fell on Thursday, hit head and shoulder.  Still having dull headaches and nausea at times.  OV scheduled with TP for  today at 4:30-- pt aware.  Raymondo Band RN  November 16, 2009 11:27 AM

## 2010-05-11 NOTE — Assessment & Plan Note (Signed)
Summary: 35mreck/klw   CC:  6 month ROV & review of mult medical problems....  History of Present Illness: 60y/o WF here for a follow up visit... she has multiple medical problems as noted below...   ~  June 09, 2009:  routine 60mo/u doing well- had recent sinus infection Rx w/ Avelox, Astepro per ENT... hx sm nodule w/ no progression serially & today's CXR similar (can't see it)... BP controlled on meds;  Chol has been OK on diet + Simva20;  hasn't been effective w/ diet/ ex "I'm working on it";  mult somatic complaints improved by regular massage therapy...   ~  December 07, 2009:  she saw TP 3 weeks ago after fall w/ minor trauma & HA- no LOC, no neuro signs, she chose to see Chiropractor & continues w/ massage therapy & improved... she takes over a dozen vits & supplements; plus she had her own RAST tests done via the internet from "[facility] [place] (in the "AVOID" category= eggs (whites & yolks), & pineapples, but also had signif reactions to fish, garlic, soybean, rye, & corn (tests scanned into the EMR)... BP remains controlled;  Lipids looked OK;  and she is working on her weight...    Current Problem List:  ABNORMAL CHEST XRAY (ICD-793.1) - CXR 3/09 w/ 9m63mUL nodule seen... serial films showed no growth & it was difficult to see at all on 12/09 CXR... nonsmoker, exposed to husb second hand smoke.  ~  f/u CXR 12/09 showed nodule ?obscured by ribs, NAD... Marland Kitchen~  CXR 3/11 showed clear lungs, no nodule seen...  HYPERTENSION (ICD-401.9) - controlled on TENORETIC 50/25 daily, ZESTRIL 2.5mg22m K20Bid... BP's at home are all in the 120/80 range and BP= 110/70 today... she is taking meds regularly and tol well... denies HA, visual changes, CP, palipit, dizziness, syncope, dyspnea, fatigue, edema, etc...   VENOUS INSUFFICIENCY (ICD-459.81) - she tries to follow a sodium restricted diet... she has VI and chr ven insuffic changes/ chr  edema... we discussed 2 gm Na+ restrictions and TED hose... her weight is up 4# from last visit...  HYPERLIPIDEMIA (ICD-272.4) - on SIMVASTATIN 20mg50m diet efforts...  ~  labs 9/08 on Simva20 showed TChol 148, TG 118, HDL 31, LDL 94  ~  [facility] Sim[facility] showed TChol 234, TG 151, HDL 34, LDL 170... rec> restart ZOCOR.  ~  [facility] TChol 157, TG 106, HDL 40, LDL 96  ~  [facility] on Simva20 showed TChol 151, TG 116, HDL 38, LDL 90  DIABETES MELLITUS, BORDERLINE (ICD-790.29) - +FamHx DM and borderline labs in past...  ~  labs 9/08 (wt= 206#) showed BS= 114, A1c= 6.2 on diet alone...  ~  [facility]  ~  [facility] 91, A1c= 5.8  OBESITY (ICD-278.00) - weight stable at about 200#... needs better diet and exercise program and we discussed this today...  ~  weight 3/11 = 204#  ~  weight 8/11 = 202#  GASTROESOPHAGEAL REFLUX DISEASE (ICD-530.81) - on OMEPRAZOLE 40mg/78m she is symptomatic if she stops this med... last EGD was 3/06 showing HH and gastritis...   COLONIC POLYPS (ICD-211.3)  ~  colonoscopy 10/04 by DrStark showed several 2mm po29ms (bx=minute frag of colonic mucosa only)...  ~  colonoscopy 6/09 showed mult 3-4mm pol74m= hyperplastic on bx, +melanosis coli...Marland KitchenMarland Kitchen  f/u 60yr.  DEGENERATIVE JOINT DISEASE (ICD-715.90) - takes ETODOLAC 4069md as needed...  ~  she sees a chiropractor monthly, and massage therapist every 2-3 weeks (helps better than anything).  FASCIITIS, PLANTAR (ICD-728.71) - she had "sonic wave" therapy from podiatry which helped somewhat...  ANXIETY (ICD-300.00)   Preventive Screening-Counseling & Management  Alcohol-Tobacco     Smoking Status: never  Allergies (verified): No Known Drug Allergies  Comments:  Nurse/Medical Assistant: The patient's medications and allergies were reviewed with the patient and were updated in the Medication and Allergy Lists.  Past  History:  Past Medical History: ABNORMAL CHEST XRAY (ICD-793.1) HYPERTENSION (ICD-401.9) VENOUS INSUFFICIENCY (ICD-459.81) HYPERLIPIDEMIA (ICD-272.4) DIABETES MELLITUS, BORDERLINE (ICD-790.29) OBESITY (ICD-278.00) GASTROESOPHAGEAL REFLUX DISEASE (ICD-530.81) COLONIC POLYPS (ICD-211.3) DEGENERATIVE JOINT DISEASE (ICD-715.90) FASCIITIS, PLANTAR (ICD-728.71) ANXIETY (ICD-300.00)  Past Surgical History: S/P oopherectomy S/P lumbar laminectomy x 2  S/P left knee arthroscopy  Family History: Reviewed history from 12/09/2008 and no changes required. Father died age 60/ heart dis, arrhythmia, DM, smoker. Mother died age 60/ pneumonia, dementia. 1 Sister in her 6031'shx unknown several aunts & cousins w/ DM  Social History: Reviewed history from 12/09/2008 and no changes required. married- [place]= [place] yrs 4 children--2 are step-children never smoked drinks--1-2 a month retired from [employer]    See HPI       The patient complains of headaches.  The patient denies anorexia, fever, weight loss, weight gain, vision loss, decreased hearing, hoarseness, chest pain, syncope, dyspnea on exertion, peripheral edema, prolonged cough, hemoptysis, abdominal pain, melena, hematochezia, severe indigestion/heartburn, hematuria, incontinence, muscle weakness, suspicious skin lesions, transient blindness, difficulty walking, depression, unusual weight change, abnormal bleeding, enlarged lymph nodes, and angioedema.    Vital Signs:  Patient profile:   89ear old female Height:      63 inches Weight:      202.13 pounds BMI:     35.94 O2 Sat:      97 % on Room air Temp:     98.7 degrees F oral Pulse rate:   63 / minute BP sitting:   110 / 70  (right arm) Cuff size:   large  Vitals Entered By: LeElita BooneMA (December 07, 2009 11:22 AM)  O2 Sat at Rest %:  97 O2 Flow:  Room air CC: 6 month ROV & review of mult medical problems... Is Patient Diabetic? No Pain  Assessment Patient in pain? no      Comments meds updated today with pt   Physical Exam  Additional Exam:  WD, Overweight, [age]/o WF in NAD... GENERAL:  Alert & oriented; pleasant & cooperative... HEENT:  [place]/AT, EOM-wnl, PERRLA, EACs-clear, TMs-wnl, NOSE-clear, THROAT-clear & wnl. NECK:  Supple w/ fairROM; no JVD; normal carotid impulses w/o bruits; no thyromegaly or nodules palpated; no lymphadenopathy. CHEST:   Clear without wheezing, rales, rhonchi heard... HEART:  Regular Rhythm; without murmurs/ rubs/ or gallops detected... ABDOMEN:  Obese, soft & nontender; normal bowel sounds; no organomegaly or masses detected. EXT: without deformities, mild arthritic changes; no varicose veins/ +venous insuffic & chr changes/ 1+ edema. NEURO:  CN's intact; no focal neuro deficits... DERM:  No lesions noted; no rash etc...    Impression & Recommendations:  Problem # 1:  HYPERTENSION (ICD-401.9) BP controlled>  same meds. Her updated medication list for this problem includes:    Atenolol-chlorthalidone 50-25 Mg Tabs (Atenolol-chlorthalidone) ...Marland Kitchen. Take 1 tab by mouth once daily...    Zestril 2.5 Mg Tabs (  Lisinopril) .Marland Kitchen... Take 1 tab by mouth once daily...  Problem # 2:  HYPERLIPIDEMIA (GGE-366.4) Stable on these 2 meds>  continue same. Her updated medication list for this problem includes:    Simvastatin 20 Mg Tabs (Simvastatin) .Marland Kitchen... Take 1 tab by mouth at bedtime...    Niacin 500 Mg Tabs (Niacin) .Marland Kitchen... Take one tablet by mouth two times a day  Problem # 3:  DIABETES MELLITUS, BORDERLINE (ICD-790.29) Diet controlled> but she needs to lose weight...  Problem # 4:  OBESITY (ICD-278.00) We discussed diet + exercise program needed to lose weight...  Problem # 5:  COLONIC POLYPS (ICD-211.3) Stable & up to date on screening...  Problem # 6:  DEGENERATIVE JOINT DISEASE (ICD-715.90) Etodolac, massage therapy, etc> all seem to help some... Her updated medication list for this problem  includes:    Aspir-low 81 Mg Tbec (Aspirin) .Marland Kitchen... 1 tablet by mouth two times a day    Etodolac 400 Mg Tabs (Etodolac) .Marland Kitchen... 1 by mouth two times a day  Problem # 7:  OTHER MEDICAL PROBLEMS AS NOTED>>>  Complete Medication List: 1)  Aspir-low 81 Mg Tbec (Aspirin) .Marland Kitchen.. 1 tablet by mouth two times a day 2)  Atenolol-chlorthalidone 50-25 Mg Tabs (Atenolol-chlorthalidone) .... Take 1 tab by mouth once daily.Marland KitchenMarland Kitchen 3)  Zestril 2.5 Mg Tabs (Lisinopril) .... Take 1 tab by mouth once daily.Marland KitchenMarland Kitchen 4)  Klor-con M20 20 Meq Tbcr (Potassium chloride crys cr) .Marland Kitchen.. 1 tab by mouth two times a day 5)  Simvastatin 20 Mg Tabs (Simvastatin) .... Take 1 tab by mouth at bedtime.Marland KitchenMarland Kitchen 6)  Niacin 500 Mg Tabs (Niacin) .... Take one tablet by mouth two times a day 7)  Fish Oil 1000 Mg Caps (Omega-3 fatty acids) .... Take 1 capsule by mouth two times a day 8)  Coenzyme Q10 100 Mg Caps (Coenzyme q10) .... Take 1 capsule by mouth two times a day 9)  Omeprazole 40 Mg Cpdr (Omeprazole) .... Take 1 tablet by mouth once a day 10)  Etodolac 400 Mg Tabs (Etodolac) .Marland Kitchen.. 1 by mouth two times a day 11)  Osteo Bi-flex Regular Strength 250-200 Mg Tabs (Glucosamine-chondroitin) .... Take 1 tablet by mouth once a day 12)  Calcium-vitamin D 500-125 Mg-unit Tabs (Calcium-vitamin d) .... Take 1 tablet by mouth once a day 13)  Multivitamins Tabs (Multiple vitamin) .... Take 1 tablet by mouth once a day 14)  Vitamin D 1000 Unit Tabs (Cholecalciferol) .... Take 1 tablet by mouth once a day 15)  Magnesium 250 Mg Tabs (Magnesium) .... Take 1 tablet by mouth two times a day 16)  Folic Acid 294 Mcg Tabs (Folic acid) .... Take 1 tablet by mouth once a day  Patient Instructions: 1)  Today we updated your med list- see below.... 2)  Continue your current meds the same... 3)  Please be careful- no falling allowed!!! 4)  Call for any problems.Marland KitchenMarland Kitchen 5)  Please schedule a follow-up appointment in 6 months, with FASTING blood work at that  time.   Immunization History:  Influenza Immunization History:    Influenza:  historical (01/21/2009)

## 2010-06-09 ENCOUNTER — Encounter (INDEPENDENT_AMBULATORY_CARE_PROVIDER_SITE_OTHER): Payer: Self-pay | Admitting: *Deleted

## 2010-06-09 ENCOUNTER — Ambulatory Visit (INDEPENDENT_AMBULATORY_CARE_PROVIDER_SITE_OTHER)
Admission: RE | Admit: 2010-06-09 | Discharge: 2010-06-09 | Disposition: A | Discharge status: Home / Self Care | Payer: No Typology Code available for payment source | Source: Ambulatory Visit | Attending: Pulmonary Disease | Admitting: Pulmonary Disease

## 2010-06-09 ENCOUNTER — Other Ambulatory Visit: Payer: No Typology Code available for payment source

## 2010-06-09 ENCOUNTER — Encounter: Payer: Self-pay | Admitting: Pulmonary Disease

## 2010-06-09 ENCOUNTER — Ambulatory Visit (INDEPENDENT_AMBULATORY_CARE_PROVIDER_SITE_OTHER): Payer: No Typology Code available for payment source | Admitting: Pulmonary Disease

## 2010-06-09 ENCOUNTER — Other Ambulatory Visit: Payer: Self-pay | Admitting: Pulmonary Disease

## 2010-06-09 DIAGNOSIS — E785 Hyperlipidemia, unspecified: Secondary | ICD-10-CM

## 2010-06-09 DIAGNOSIS — Z23 Encounter for immunization: Secondary | ICD-10-CM

## 2010-06-09 DIAGNOSIS — R9389 Abnormal findings on diagnostic imaging of other specified body structures: Secondary | ICD-10-CM

## 2010-06-09 DIAGNOSIS — K219 Gastro-esophageal reflux disease without esophagitis: Secondary | ICD-10-CM

## 2010-06-09 DIAGNOSIS — I10 Essential (primary) hypertension: Secondary | ICD-10-CM

## 2010-06-09 DIAGNOSIS — E669 Obesity, unspecified: Secondary | ICD-10-CM

## 2010-06-09 DIAGNOSIS — R911 Solitary pulmonary nodule: Secondary | ICD-10-CM

## 2010-06-09 DIAGNOSIS — D126 Benign neoplasm of colon, unspecified: Secondary | ICD-10-CM

## 2010-06-09 DIAGNOSIS — R918 Other nonspecific abnormal finding of lung field: Secondary | ICD-10-CM

## 2010-06-09 DIAGNOSIS — R748 Abnormal levels of other serum enzymes: Secondary | ICD-10-CM

## 2010-06-09 DIAGNOSIS — D649 Anemia, unspecified: Secondary | ICD-10-CM

## 2010-06-09 DIAGNOSIS — E039 Hypothyroidism, unspecified: Secondary | ICD-10-CM

## 2010-06-09 DIAGNOSIS — D510 Vitamin B12 deficiency anemia due to intrinsic factor deficiency: Secondary | ICD-10-CM

## 2010-06-09 DIAGNOSIS — I872 Venous insufficiency (chronic) (peripheral): Secondary | ICD-10-CM

## 2010-06-09 DIAGNOSIS — R7309 Other abnormal glucose: Secondary | ICD-10-CM

## 2010-06-22 NOTE — Assessment & Plan Note (Signed)
Summary: 6 month rov/apc   CC:  6 month ROV & review of mult medical problems....  History of Present Illness: 60 y/o WF here for a follow up visit... she has multiple medical problems as noted below...   ~  June 09, 2009:  routine 52mof/u doing well- had recent sinus infection Rx w/ Avelox, Astepro per ENT... hx sm nodule w/ no progression serially & today's CXR similar (can't see it)... BP controlled on meds;  Chol has been OK on diet + Simva20;  hasn't been effective w/ diet/ ex "I'm working on it";  mult somatic complaints improved by regular massage therapy...   ~  December 07, 2009:  she saw TP 3 weeks ago after fall w/ minor trauma & HA- no LOC, no neuro signs, she chose to see Chiropractor & continues w/ massage therapy & improved... she takes over a dozen vits & supplements; plus she had her own RAST tests done via the internet [facility] [place] (in the "AVOID" category= eggs (whites & yolks), & pineapples, but also had signif reactions to fish, garlic, soybean, rye, & corn (tests scanned into the EMR)... BP remains controlled;  Lipids looked OK;  and she is working on her weight...   ~  June 09, 2010:  6 mo ROV- doing better on her elimination diet, elim the foods she was sensitive to on the RAST testing & she notes that when she eats something she isn't supposed to-then she gets aching joints in addition to "a burning oozing diarrhea"... weight is down 12# to 190# & she is encouraged to keep up the good work... BP controlled on meds;  she is stable on the Simva20 but labs have to go to [facility] & are "pending";  with her wt reduction we anticipate the BP, Chol & BS will all be better & easier to manage...    Current Problem List:  ABNORMAL CHEST XRAY (ICD-793.1) - CXR 3/09 w/ 7344mRUL nodule seen... serial films showed no growth & it was difficult to see at all on 12/09 CXR... nonsmoker, exposed to husb second hand smoke.  ~   f/u CXR 12/09 showed nodule ?obscured by ribs, NAD...Marland Kitchen ~  CXR 3/11 showed clear lungs, no nodule seen...  ~  CXR 2/12 showed WNL, NAD...  HYPERTENSION (ICD-401.9) - controlled on TENORETIC 50/25 daily, ZESTRIL 2.44m11m, K20Bid... BP's at home are all in the 120/80 range and BP= 124/84 today... she is taking meds regularly and tol well... denies HA, visual changes, CP, palipit, dizziness, syncope, dyspnea, fatigue, edema, etc...   VENOUS INSUFFICIENCY (ICD-459.81) - she tries to follow a sodium restricted diet... she has VI and chr ven insuffic changes/ chr edema... we discussed 2 gm Na+ restrictions and TED hose... her weight is up 4# from last visit...  HYPERLIPIDEMIA (ICD-272.4) - on SIMVASTATIN 31m6m+ NIACIN 500mg66m & CoQ10 +diet efforts...  ~  labs 9/08 on Simva20 showed TChol 148, TG 118, HDL 31, LDL 94  ~  [facility] showed TChol 234, TG 151, HDL 34, LDL 170... rec> restart ZOCOR.  ~  [facility] TChol 157, TG 106, HDL 40, LDL 96  ~  labs 3/11 at [facility] on Simva20 showed TChol 151, TG 116, HDL 38, LDL 90  DIABETES MELLITUS, BORDERLINE (ICD-790.29) - +FamHx DM and borderline labs in past...  ~  labs 9/08 (wt= 206#) showed BS= 114,  A1c= 6.2 on diet alone...  ~  labs 12/09 at [place] showed BS= 107  ~  labs 3/11 at [facility] showed BS= 91, A1c= 5.8  OBESITY (ICD-278.00) - weight stable at about 200#... needs better diet and exercise program and we discussed this today...  ~  weight 3/11 = 204#  ~  weight 8/11 = 202#  ~  weight 2/12 = 190#  GASTROESOPHAGEAL REFLUX DISEASE (ICD-530.81) - on OMEPRAZOLE Prn (she stopped when diet made her better)... last EGD was 3/06 showing HH and gastritis...   COLONIC POLYPS (ICD-211.3)  ~  colonoscopy 10/04 by DrStark showed several 75m polyps (bx=minute frag of colonic mucosa only)...  ~  colonoscopy 6/09 showed mult 3-454mpolyps= hyperplastic on bx, +melanosis coli... f/u 5y40yr DEGENERATIVE JOINT DISEASE  (ICD-715.90) - prev on Etodolac but stopped since it upset her stomach...  ~  she sees a chiropractor monthly, and massage therapist every 2-3 weeks (helps better than anything).  FASCIITIS, PLANTAR (ICD-728.71) - she had "sonic wave" therapy from podiatry which helped somewhat...  ANXIETY (ICD-300.00)  Health Maintenance:  ~  GI:  followed by DrStark  ~  GYN:  followed by DrGottsegen, & he follows her BMDs too; [facility]  ~  Immunizations:  she used to get the yearly Flu vaccine but she stopped when she found out she was allergic to eggs on the internet rast testing... OK TDAP today.   Preventive Screening-Counseling & Management  Alcohol-Tobacco     Smoking Status: never     Passive Smoke Exposure: yes  Comments: pt stated that her husband is a chain smoker  Allergies (verified): 1)  * Omeprazole 2)  * Etodolac  Comments:  Nurse/Medical Assistant: The patient's medications and allergies were reviewed with the patient and were updated in the Medication and Allergy Lists.  Past History:  Past Medical History: ABNORMAL CHEST XRAY (ICD-793.1) HYPERTENSION (ICD-401.9) VENOUS INSUFFICIENCY (ICD-459.81) HYPERLIPIDEMIA (ICD-272.4) DIABETES MELLITUS, BORDERLINE (ICD-790.29) OBESITY (ICD-278.00) GASTROESOPHAGEAL REFLUX DISEASE (ICD-530.81) COLONIC POLYPS (ICD-211.3) DEGENERATIVE JOINT DISEASE (ICD-715.90) FASCIITIS, PLANTAR (ICD-728.71) ANXIETY (ICD-300.00)  Past Surgical History: S/P oopherectomy S/P lumbar laminectomy x 2  S/P left knee arthroscopy  Family History: Reviewed history from 12/07/2009 and no changes required. Father died age 83 52 heart dis, arrhythmia, DM, smoker. Mother died age 60 44 pneumonia, dementia. 1 Sister in her 60'20'sx unknown several aunts & cousins w/ DM  Social History: Reviewed history from 12/07/2009 and no changes required. married- husb= BobColon9 yrs 4 children--2 are step-children never smoked drinks--1-2 a  month retired from [employer]:  yes  Review of Systems      See HPI  The patient denies anorexia, fever, weight loss, weight gain, vision loss, decreased hearing, hoarseness, chest pain, syncope, dyspnea on exertion, peripheral edema, prolonged cough, headaches, hemoptysis, abdominal pain, melena, hematochezia, severe indigestion/heartburn, hematuria, incontinence, muscle weakness, suspicious skin lesions, transient blindness, difficulty walking, depression, unusual weight change, abnormal bleeding, enlarged lymph nodes, and angioedema.    Vital Signs:  Patient profile:   [age]ar old female Height:      63 inches Weight:      189.50 pounds BMI:     33.69 O2 Sat:      97 % on Room air Temp:     97.0 degrees F oral Pulse rate:   82 / minute BP sitting:   124 / 84  (right arm) Cuff size:   regular  Vitals Entered By: LeiElita BooneA (June 09, 2010 10:15 AM)  O2 Sat at Rest %:  97 O2 Flow:  Room air CC: 6 month ROV & review of mult medical problems... Is Patient Diabetic? No Pain Assessment Patient in pain? no      Comments meds updated today with pt   Physical Exam  Additional Exam:  WD, Overweight, 60 y/o WF in NAD... GENERAL:  Alert & oriented; pleasant & cooperative... HEENT:  [place]/AT, EOM-wnl, PERRLA, EACs-clear, TMs-wnl, NOSE-clear, THROAT-clear & wnl. NECK:  Supple w/ fairROM; no JVD; normal carotid impulses w/o bruits; no thyromegaly or nodules palpated; no lymphadenopathy. CHEST:   Clear without wheezing, rales, rhonchi heard... HEART:  Regular Rhythm; without murmurs/ rubs/ or gallops detected... ABDOMEN:  Obese, soft & nontender; normal bowel sounds; no organomegaly or masses detected. EXT: without deformities, mild arthritic changes; no varicose veins/ +venous insuffic & chr changes/ 1+ edema. NEURO:  CN's intact; no focal neuro deficits... DERM:  No lesions noted; no rash etc...    Impression & Recommendations:  Problem # 1:  ABNORMAL  CHEST XRAY (ZHY-865.1) Follow up CXR is essent clear> no lesion seen... Orders: T-2 View CXR ([id number])  Problem # 2:  HYPERTENSION (ICD-401.9) BP controlled>  same meds. Her updated medication list for this problem includes:    Atenolol-chlorthalidone 50-25 Mg Tabs (Atenolol-chlorthalidone) .Marland Kitchen... Take 1 tab by mouth once daily...    Zestril 2.5 Mg Tabs (Lisinopril) .Marland Kitchen... Take 1 tab by mouth once daily... Orders: 12 Lead EKG (12 Lead EKG) T-2 View CXR ([id number])  Problem # 3:  HYPERLIPIDEMIA (ICD-272.4) Awaiting f/u labs at [facility] > continue current meds. Her updated medication list for this problem includes:    Simvastatin 20 Mg Tabs (Simvastatin) .Marland Kitchen... Take 1 tab by mouth at bedtime...    Niacin 500 Mg Tabs (Niacin) .Marland Kitchen... Take one tablet by mouth two times a day  Problem # 4:  DIABETES MELLITUS, BORDERLINE (ICD-790.29) This has been diet controlled> continue doiet efforts...  Problem # 5:  OBESITY (ICD-278.00) Great job w/ weight reduction...  Problem # 6:  GASTROESOPHAGEAL REFLUX DISEASE (ICD-530.81) GI is improved on her diet Rx... The following medications were removed from the medication list:    Omeprazole 40 Mg Cpdr (Omeprazole) .Marland Kitchen... Take 1 tablet by mouth once a day  Problem # 7:  OTHER MEDICAL PROBLEMS AS NOTED>>>  Complete Medication List: 1)  Aspir-low 81 Mg Tbec (Aspirin) .Marland Kitchen.. 1 tablet by mouth two times a day 2)  Atenolol-chlorthalidone 50-25 Mg Tabs (Atenolol-chlorthalidone) .... Take 1 tab by mouth once daily.Marland KitchenMarland Kitchen 3)  Zestril 2.5 Mg Tabs (Lisinopril) .... Take 1 tab by mouth once daily.Marland KitchenMarland Kitchen 4)  Klor-con M20 20 Meq Tbcr (Potassium chloride crys cr) .Marland Kitchen.. 1 tab by mouth two times a day 5)  Simvastatin 20 Mg Tabs (Simvastatin) .... Take 1 tab by mouth at bedtime.Marland KitchenMarland Kitchen 6)  Niacin 500 Mg Tabs (Niacin) .... Take one tablet by mouth two times a day 7)  Flax Seed Oil 1000 Mg Caps (Flaxseed (linseed)) .... Take 1 tablet by mouth once a day 8)  Coenzyme Q10 100 Mg Caps (Coenzyme  q10) .... Take 1 capsule by mouth two times a day 9)  Glucosamine-chondroitin 500-400 Mg Caps (Glucosamine-chondroitin) .... Take 1 tablet by mouth once a day 10)  Calcium-vitamin D 500-125 Mg-unit Tabs (Calcium-vitamin d) .... Take 1 tablet by mouth once a day 11)  Multivitamins Tabs (Multiple vitamin) .... Take 1 tablet by mouth once a day 12)  Vitamin D 1000 Unit Tabs (Cholecalciferol) .... Take 1 tablet by  mouth once a day 13)  Magnesium 250 Mg Tabs (Magnesium) .... Take 1 tablet by mouth two times a day 14)  Folic Acid 478 Mcg Tabs (Folic acid) .... Take 1 tablet by mouth once a day 15)  Vicodin 5-500 Mg Tabs (Hydrocodone-acetaminophen) .... Take one tablet three times daily as needed for pain 16)  Robaxin 500 Mg Tabs (Methocarbamol) .... Take one tablet by mouth three times a day as needed spasms  Other Orders: Tdap => 37yr IM ((29562 Admin 1st Vaccine ([id number]  Patient Instructions: 1)  Today we updated your med list- see below.... 2)  We refilled your meds per request... 3)  Today we did your follow up CXR, EKG, & fasting blood work... please call the "phone tree" in a few days for your lab results..Marland KitchenMarland Kitchen 4)  Keep up the good work w/ diet, exercise, & weight reduction..Marland KitchenMarland Kitchen5)  We gave you the combinatioon Tetanus vaccine called the TDAP todday (it is good for 131yr... 6)  Call for any problems...Marland KitchenMarland Kitchen)  Please schedule a follow-up appointment in 1 year, sooner as needed... Prescriptions: ROBAXIN 500 MG TABS (METHOCARBAMOL) take one tablet by mouth three times a day as needed spasms  #50 x 6   Entered and Authorized by:   ScNoralee SpaceD   Signed by:   ScNoralee SpaceD on 06/09/2010   Method used:   Print then Give to Patient   RxID:   [id number] 5-500 MG TABS (HYDROCODONE-ACETAMINOPHEN) take one tablet three times daily as needed for pain  #50 x 6   Entered and Authorized by:   ScNoralee SpaceD   Signed by:   ScNoralee SpaceD on 06/09/2010   Method used:   Print then  Give to Patient   RxID:   [id number] 20 MG  TABS (SIMVASTATIN) take 1 tab by mouth at bedtime...  #90 x 4   Entered and Authorized by:   ScNoralee SpaceD   Signed by:   ScNoralee SpaceD on 06/09/2010   Method used:   Print then Give to Patient   RxID:   [id number]-CON M20 20 MEQ TBCR (POTASSIUM CHLORIDE CRYS CR) 1 TAB by mouth two times a day  #180 x 4   Entered and Authorized by:   ScNoralee SpaceD   Signed by:   ScNoralee SpaceD on 06/09/2010   Method used:   Print then Give to Patient   RxID:   [id number] 2.5 MG  TABS (LISINOPRIL) take 1 tab by mouth once daily...  #90 x 4   Entered and Authorized by:   ScNoralee SpaceD   Signed by:   ScNoralee SpaceD on 06/09/2010   Method used:   Print then Give to Patient   RxID:   [id number]-CHLORTHALIDONE 50-25 MG TABS (ATENOLOL-CHLORTHALIDONE) take 1 tab by mouth once daily...  #90 x 4   Entered and Authorized by:   ScNoralee SpaceD   Signed by:   ScNoralee SpaceD on 06/09/2010   Method used:   Print then Give to Patient   RxID:   [id number]  Immunization History:  Influenza Immunization History:    Influenza:  pt allergic to eggs (06/09/2010)  Immunizations Administered:  Tetanus Vaccine:    Vaccine Type: Tdap    Site: left deltoid    Mfr: GlaxoSmithKline    Dose: 0.5 ml    Route: IM  Given by: Elita Boone CMA    Exp. Date: 01/29/2012    Lot #: [id number]    VIS given: 02/27/08 version given June 09, 2010.

## 2010-07-01 ENCOUNTER — Telehealth: Payer: Self-pay | Admitting: Pulmonary Disease

## 2010-07-01 NOTE — Telephone Encounter (Signed)
Pt uses outside lab to have lab orders done; I have discussed this with Leigh and she is going to research this in the morning.

## 2010-07-01 NOTE — Telephone Encounter (Signed)
Called, spoke with pt.  States she had labs drawn here on 06/09/10 at [place] but they were sent out to [place] lab.  Would like to know if Dr. Lenna Gilford ever received the results.  Pls advise.  Thanks! **Pt ok with call back tomorrow.

## 2010-07-05 NOTE — Telephone Encounter (Signed)
Called and spoke with [facility] and am waiting on them to fax over the lab results

## 2010-07-06 NOTE — Telephone Encounter (Signed)
Called and spoke with pt about her lab results from [facility]---she is aware per SN to cont the same meds ---all labs looked good on current meds.  Pt voiced her understanding of recs and results and requested that a copy be mailed to her home.  This has been done as well

## 2010-07-09 ENCOUNTER — Encounter: Payer: Self-pay | Admitting: Pulmonary Disease

## 2010-07-13 ENCOUNTER — Encounter: Payer: Self-pay | Admitting: Pulmonary Disease

## 2010-08-27 NOTE — Assessment & Plan Note (Signed)
[name]                             PULMONARY OFFICE NOTE   NAME:STEELMANSakiyah, [name]                     MRN:          [id number]  DATE:04/27/2006                            DOB:          May 03, 1950    HISTORY OF PRESENT ILLNESS:  The patient is a 60 year old white female,  patient of Dr. Lenna Gilford, who has a known history of hypertension and  hyperlipidemia, presents to the office with a 3-day history of sore  throat, fever, chills, nausea and vomiting.  Patient reports initially  she got some nasal congestion and sore throat that slowly improved,  however, this morning when she woke up, had worsened.  She had some  nausea and vomiting over the last 2 days.  That seems to be decreased,  and has been able to keep food down over the last 3 hours.  Patient  denies any purulent sputum, chest pain, difficulty swallowing, shortness  of breath, recent travel or antibiotic use.   PAST MEDICAL HISTORY:  Reviewed.   CURRENT MEDICATIONS:  Reviewed.   PHYSICAL EXAMINATION:  Patient is a pleasant female, in no acute  distress.  She is afebrile with stable vital signs.  Her 02 saturation is 97% on  room air.  HEENT:  Conjunctivae not injected.  Nasal mucosa slightly pale.  TMs are  normal.  Posterior pharynx with some mild redness.  NECK:  Supple otherwise adenopathy.  Negative nuchal rigidity.  LUNGS SOUNDS:  Clear to auscultation bilaterally.  CARDIAC:  Regular rate and rhythm.  ABDOMEN:  Soft without hepatosplenomegaly.  No guarding or rebound  noted.  Bowel sounds are positive throughout all 4 quadrants.  No masses  or bruits appreciated.  Negative CVA tenderness.  EXTREMITIES:  Warm without any calf cyanosis, clubbing or edema.  SKIN:  Warm without any rash.   DATA:  Strep test is negative.   IMPRESSION AND PLAN:  Acute pharyngitis with associated gastroenteritis.  Suspect it is all viral in nature.  The patient is to increase fluid  intake as  tolerated.  May use Phenergan 25 mg one every 4 to 6 hours as  needed.  Patient is to advance to a clear liquid diet as tolerated.  May  use Magic Mouthwash as needed for sore throat.  May use Tylenol as  needed for fever.  Patient will return here as scheduled with Dr. Lenna Gilford,  or sooner if needed.  Patient is advised if  symptoms do not improve or worsen, she is to contact us immediately or  go to the closest emergency department.      Rexene Edison, NP  Electronically Signed      Deborra Medina. Lenna Gilford, MD  Electronically Signed   TP/MedQ  DD: 04/27/2006  DT: 04/27/2006  Job #: [id number]

## 2010-09-27 ENCOUNTER — Encounter (INDEPENDENT_AMBULATORY_CARE_PROVIDER_SITE_OTHER): Payer: No Typology Code available for payment source | Admitting: Obstetrics and Gynecology

## 2010-09-27 DIAGNOSIS — Z01419 Encounter for gynecological examination (general) (routine) without abnormal findings: Secondary | ICD-10-CM

## 2010-10-04 DIAGNOSIS — Z1211 Encounter for screening for malignant neoplasm of colon: Secondary | ICD-10-CM

## 2010-11-04 ENCOUNTER — Encounter: Payer: Self-pay | Admitting: Gynecology

## 2010-11-05 ENCOUNTER — Ambulatory Visit (INDEPENDENT_AMBULATORY_CARE_PROVIDER_SITE_OTHER): Payer: No Typology Code available for payment source | Admitting: Gynecology

## 2010-11-05 ENCOUNTER — Encounter: Payer: Self-pay | Admitting: Gynecology

## 2010-11-05 DIAGNOSIS — N952 Postmenopausal atrophic vaginitis: Secondary | ICD-10-CM

## 2010-11-05 DIAGNOSIS — N950 Postmenopausal bleeding: Secondary | ICD-10-CM

## 2010-11-05 NOTE — Progress Notes (Signed)
[name] [birth year] [id number]   The patient presents complaining of postmenopausal bleeding for approximately 2 weeks. Patient notes spontaneous onset comes and goes has some lower pelvic discomfort said it feels like she was ovulating when she was having her periods. No nausea vomiting diarrhea constipation urinary symptoms such as frequency dysuria. She is status post RSO in the past for an ovarian cyst. She has not been on HRT.    Exam  Pelvic: External genitalia:  Normal with atrophic changes  BUS/Urethra/Skene's glands:  Normal with atrophic changes  Bladder:  Normal with atrophic changes  Vagina:  Normal with atrophic changes  Cervix:  Normal with a mucoid bloody discharge at cervical os no active bleeding  Uterus:  anteverted, normal in size, shape and contour.  Midline and mobile nontender   Adnexa/parametria:  Without masses or tenderness   Anus and perineum: Normal   Digital rectal exam:  Normal    Assessment/Plan :  60 year old postmenopausal female with two-week history of a relatively painless vaginal bleeding. Patient notes a little throbbing in the pelvis evanescent of when she ovulated but no other symptoms. Exam today overall is normal with atrophic genital changes she does have some bloody mucoid discharge at the cervical os. I discussed the differential to include uterine cancer or polyps atrophic endometrium and my recommendation to proceed with sonohysterogram for better definition of endometrial cavity and allow for endometrial sampling. Patient agrees with the plan and she will schedule her sonohysterogram she knows importance of followup    Anastasio Auerbach MD, 3:09 PM 11/05/2010

## 2010-11-16 ENCOUNTER — Other Ambulatory Visit: Payer: No Typology Code available for payment source

## 2010-11-16 ENCOUNTER — Encounter: Payer: Self-pay | Admitting: Gynecology

## 2010-11-16 ENCOUNTER — Ambulatory Visit (INDEPENDENT_AMBULATORY_CARE_PROVIDER_SITE_OTHER): Payer: No Typology Code available for payment source | Admitting: Gynecology

## 2010-11-16 ENCOUNTER — Other Ambulatory Visit: Payer: Self-pay

## 2010-11-16 DIAGNOSIS — N83339 Acquired atrophy of ovary and fallopian tube, unspecified side: Secondary | ICD-10-CM

## 2010-11-16 DIAGNOSIS — D251 Intramural leiomyoma of uterus: Secondary | ICD-10-CM

## 2010-11-16 DIAGNOSIS — D259 Leiomyoma of uterus, unspecified: Secondary | ICD-10-CM

## 2010-11-16 DIAGNOSIS — N950 Postmenopausal bleeding: Secondary | ICD-10-CM

## 2010-11-16 DIAGNOSIS — N840 Polyp of corpus uteri: Secondary | ICD-10-CM

## 2010-11-16 NOTE — Progress Notes (Signed)
The patient presents with sonohysterogram due to postmenopausal bleeding. 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  Sonohysterogram performed sterile technique single tooth tenaculum anterior lip stabilization with mild cervical dilatation required.. Catheter was introduced there was good distention and a clear endometrial polyp was visualized. Endometrial sample was taken patient tolerated well.  I discussed the situation with the patient and my recommendation that she proceed with hysteroscopy D&C removal of the polyp regardless of the biopsy results unless overt carcinoma then will refer to GYN oncology. I discussed what was involved with the procedure to include instrumentation use of the resectoscope D&C portion. I discussed the risks to include infection prolonged antibiotics, bleeding necessitating transfusion and the risks of transfusion, uterine perforation leading to damage to internal organs either immediately recognized or delay recognized, requiring major reparative surgeries and future reparative surgeries including bowel resection bladder repair ureteral damage repair ostomy formation was all discussed understood and accepted. The risks of distended media absorption leading to metabolic complications such as coma and seizures was also discussed with her again understood and accepted. Patient wants to go ahead and schedule the surgery and we'll move towards doing this at her convenience. I reviewed her past medical surgical history , allergies, medications I performed a complete physical today.  Exam HEENT normal Lungs clear Cardiac regular rate without rubs murmurs or gallops Abdomen soft nontender without masses guarding rebound Pelvic external BUS vagina atrophic changes, cervix normal, bimanual uterus normal size midline mobile nontender adnexa without masses or  tenderness  Patient knows that we'll be calling her with the biopsy results in several days and to call her to schedule surgery.

## 2010-11-17 ENCOUNTER — Telehealth: Payer: Self-pay

## 2010-11-17 NOTE — Telephone Encounter (Signed)
Patient called to inquire regarding when her surgery is scheduled.  Pt. adivised Aug 28 at 7:30am at [facility] and this works fine for her. She was instructed to be NPO after midnight the night before surgery and to have someone to drive her home from surgery and to be with her for 24 hours after surgery while she recovers at home.  Dr. Phineas Real had indicated that she would not need a preop consult but I did tell her to come by for labs the week before surgery and instructed her regarding days and times.  I mailed her a pamphlet which reiterates all these instructions and also I mailed her ins/financial letter.

## 2010-11-18 ENCOUNTER — Other Ambulatory Visit: Payer: Self-pay | Admitting: Gynecology

## 2010-11-19 ENCOUNTER — Other Ambulatory Visit: Payer: Self-pay | Admitting: Gynecology

## 2010-11-19 DIAGNOSIS — Z01818 Encounter for other preprocedural examination: Secondary | ICD-10-CM

## 2010-11-29 ENCOUNTER — Other Ambulatory Visit: Payer: Self-pay | Admitting: Gynecology

## 2010-11-29 ENCOUNTER — Other Ambulatory Visit: Payer: Self-pay

## 2010-12-01 ENCOUNTER — Other Ambulatory Visit (INDEPENDENT_AMBULATORY_CARE_PROVIDER_SITE_OTHER): Payer: No Typology Code available for payment source | Admitting: *Deleted

## 2010-12-01 DIAGNOSIS — Z01818 Encounter for other preprocedural examination: Secondary | ICD-10-CM

## 2010-12-01 LAB — COMPREHENSIVE METABOLIC PANEL
ALT: 34 U/L (ref 0–35)
AST: 40 U/L — ABNORMAL HIGH (ref 0–37)
Creat: 0.96 mg/dL (ref 0.50–1.10)
Total Bilirubin: 0.3 mg/dL (ref 0.3–1.2)
Total Protein: 7.4 g/dL (ref 6.0–8.3)

## 2010-12-03 ENCOUNTER — Other Ambulatory Visit: Payer: No Typology Code available for payment source

## 2010-12-05 ENCOUNTER — Other Ambulatory Visit: Payer: Self-pay | Admitting: Gynecology

## 2010-12-06 ENCOUNTER — Telehealth: Payer: Self-pay

## 2010-12-06 DIAGNOSIS — N950 Postmenopausal bleeding: Secondary | ICD-10-CM

## 2010-12-06 MED ORDER — MISOPROSTOL 200 MCG PO TABS: ORAL_TABLET | ORAL | Status: AC

## 2010-12-06 NOTE — Telephone Encounter (Signed)
I had a staff message this morning from Dr. Phineas Real asking me to inform patient regarding need for Cytotec tab vaginally tonight before surgery tomorrow.  Pt was instructed. I called [facility] to make sure that it was in stock. They had it and I gave them prescription over the phone. KA

## 2010-12-07 ENCOUNTER — Encounter: Payer: Self-pay | Admitting: Gynecology

## 2010-12-07 ENCOUNTER — Other Ambulatory Visit: Payer: Self-pay | Admitting: Gynecology

## 2010-12-07 ENCOUNTER — Ambulatory Visit (HOSPITAL_BASED_OUTPATIENT_CLINIC_OR_DEPARTMENT_OTHER)
Admission: RE | Admit: 2010-12-07 | Discharge: 2010-12-07 | Disposition: A | Discharge status: Home / Self Care | Payer: No Typology Code available for payment source | Source: Ambulatory Visit | Attending: Gynecology | Admitting: Gynecology

## 2010-12-07 DIAGNOSIS — Z01810 Encounter for preprocedural cardiovascular examination: Secondary | ICD-10-CM | POA: Insufficient documentation

## 2010-12-07 DIAGNOSIS — I10 Essential (primary) hypertension: Secondary | ICD-10-CM | POA: Insufficient documentation

## 2010-12-07 DIAGNOSIS — Z79899 Other long term (current) drug therapy: Secondary | ICD-10-CM | POA: Insufficient documentation

## 2010-12-07 DIAGNOSIS — N840 Polyp of corpus uteri: Secondary | ICD-10-CM

## 2010-12-07 DIAGNOSIS — Z9079 Acquired absence of other genital organ(s): Secondary | ICD-10-CM | POA: Insufficient documentation

## 2010-12-07 DIAGNOSIS — N950 Postmenopausal bleeding: Secondary | ICD-10-CM

## 2010-12-07 DIAGNOSIS — Z8673 Personal history of transient ischemic attack (TIA), and cerebral infarction without residual deficits: Secondary | ICD-10-CM | POA: Insufficient documentation

## 2010-12-07 DIAGNOSIS — Z7982 Long term (current) use of aspirin: Secondary | ICD-10-CM | POA: Insufficient documentation

## 2010-12-07 HISTORY — PX: HYSTEROSCOPY W/D&C: SHX1775

## 2010-12-07 HISTORY — PX: HYSTEROSCOPY WITH D & C: SHX1775

## 2010-12-13 NOTE — H&P (Signed)
  NAMEMONETTA, [name]              ACCOUNT NO.:  [id number]  MEDICAL RECORD NO.:  [id number]  LOCATION:                                 FACILITY:  PHYSICIAN:  Gaylia Kassel P. Geneive Sandstrom, M.D.DATE OF BIRTH:  January 18, 1951  DATE OF ADMISSION: DATE OF DISCHARGE:                             HISTORY & PHYSICAL   DATE OF ADMISSION:  December 07, 2010, 7:30 a.m.  CHIEF COMPLAINT:  Postmenopausal bleeding, endometrial polyp.  HISTORY OF PRESENT ILLNESS:  60 year old postmenopausal female presents with new-onset vaginal bleeding latter part of July.  The patient underwent sonohysterogram, which showed several small myomas as well as a posterior left wall endometrial defect consistent with an endometrial polyp measuring 19 x 7 x 4 mm.  An endometrial biopsy at that time was benign.  The patient is admitted for hysteroscopy, D and C, and removal of endometrial polyp.  PAST MEDICAL HISTORY:  Hypertension, arthritis, hypercholesterolemia, and osteopenia.  PAST SURGICAL HISTORY:  Includes right salpingo-oophorectomy, tubal sterilization, arthroscopic knee surgery, oral surgery, back surgery x2, deviated septum repair.  CURRENT MEDICATIONS:  Include low-dose aspirin, Tenoretic, lisinopril, simvastatin, potassium chloride, multivitamin, calcium with vitamin D.  ALLERGIES:  ETODOLAC and OMEPRAZOLE.  REVIEW OF SYSTEMS:  Noncontributory.  FAMILY HISTORY:  Noncontributory.  SOCIAL HISTORY:  Noncontributory.  ADMISSION PHYSICAL EXAM:  VITAL SIGNS:  Afebrile. Vital signs are stable. HEENT:  Normal. LUNGS:  Clear. HEART:  Regular rate.  No rubs, murmurs or gallops. ABDOMEN:  Benign. PELVIC:  External BUS, vagina with atrophic changes.  Cervix grossly normal.  Uterus normal size, midline, mobile, and nontender.  Adnexa without masses or tenderness.  ASSESSMENT AND PLAN:  60 year old female with new-onset postmenopausal bleeding.  Sonohysterogram is suggestive of endometrial polyp.   Blind endometrial biopsy was benign.  Admitted for hysteroscopy, D and C, and removal of her endometrial polyp.  I reviewed with the patient the proposed surgery, the expected intraoperative and postoperative courses, instrumentation, use of the resectoscope, and D and C portion of the procedure.  I discussed the risks to include infection, prolonged antibiotics, bleeding necessitating transfusion and the risks of transfusion, including transfusion reaction, hepatitis, HIV, mad-cow disease, and other unknown entities.  The risk of uterine perforation leading to damage to internal organs, either immediately recognized or delay recognized, requiring major reparative surgeries, and future reparative surgeries including bowel resection, bladder repair, ureteral damage repair, ostomy formation was all discussed, understood and accepted.  The risk of distention media absorption leading to metabolic complications, such as coma and  seizures was also discussed, understood and accepted.  The patient's questions were answered to her satisfaction.  She is ready to proceed with surgery.     Yitzhak Awan P. Phineas Real, M.D.     TPF/MEDQ  D:  12/05/2010  T:  12/05/2010  Job:  [id number]  Electronically Signed by Donalynn Furlong M.D. on 12/13/2010 19:62:22 AM

## 2010-12-13 NOTE — Op Note (Signed)
  NAMELAWREN, SEXSON              ACCOUNT NO.:  [id number]  MEDICAL RECORD NO.:  [id number]  LOCATION:                                 FACILITY:  PHYSICIAN:  Kenshin Splawn P. Tobie Hellen, M.D.   DATE OF BIRTH:  DATE OF PROCEDURE:  12/07/2010 DATE OF DISCHARGE:                              OPERATIVE REPORT   PREOPERATIVE DIAGNOSES:  Postmenopausal bleeding, endometrial polyp.  POSTOPERATIVE DIAGNOSES:  Postmenopausal bleeding, endometrial polyp.  PROCEDURE:  Hysteroscopy, endometrial polyp resection, and dilation and curettage.  SURGEON:  Evalise Abruzzese P. Dechelle Attaway, MD  ANESTHETIC:  General.  COMPLICATIONS:  None.  ESTIMATED BLOOD LOSS:  Minimal.  DISTENTION MEDIA DISCREPANCY:  Minimal.  SPECIMEN: 1. Endometrial polyp. 2. Endometrial curetting.  FINDINGS:  EUA:  External BUS, vagina normal with atrophic changes. Cervix normal.  Bimanual:  Uterus normal size, midline, mobile.  Adnexa without masses.  Hysteroscopic:  With endometrial polyp, long stalk, from the mid fundal region removed in its entirety.  Hysteroscopy otherwise was normal noting fundus, anterior and posterior uterine surfaces, lower uterine segment, endocervical canal, right and left tubal ostia, all visualized and normal.  PROCEDURE IN DETAIL:  The patient was taken to the operating room, underwent general anesthesia, and placed in low dorsal lithotomy position.  She received a perineal and vaginal preparation with Betadine solution.  Bladder emptied with in-and-out Foley catheterization.  EUA performed.  The patient draped in the usual fashion.  Cervix visualized with a weighted speculum, anterior lip grasped with single-tooth tenaculum and a paracervical block using 1% lidocaine was placed, a total of 10 mL.  Cervix was gently gradually dilated to admit the operative hysteroscope and hysteroscopy was performed with findings noted above.  Using the right-angle resectoscopic loop, endometrial polyp was excised at  its base at the level of the surrounding endometrium.  This was sent to pathology.  A sharp curettage was then performed and again this was sent to pathology.  Re-hysteroscopy showed an empty cavity, good distention, and no evidence of perforation.  The instruments were removed.  Hemostasis visualized at the tenaculum site and the cervical os.  The patient received intraoperative Toradol.  She was placed in the supine position, awakened without difficulty, and taken to the recovery room in good condition having tolerated the procedure well.     Tywan Siever P. Phineas Real, M.D.     TPF/MEDQ  D:  12/07/2010  T:  12/07/2010  Job:  [id number]  Electronically Signed by Donalynn Furlong M.D. on 12/13/2010 09:27:41 AM

## 2010-12-14 ENCOUNTER — Encounter: Payer: Self-pay | Admitting: Gynecology

## 2010-12-27 ENCOUNTER — Ambulatory Visit (INDEPENDENT_AMBULATORY_CARE_PROVIDER_SITE_OTHER): Payer: No Typology Code available for payment source | Admitting: Gynecology

## 2010-12-27 ENCOUNTER — Encounter: Payer: Self-pay | Admitting: Gynecology

## 2010-12-27 DIAGNOSIS — N840 Polyp of corpus uteri: Secondary | ICD-10-CM

## 2010-12-27 DIAGNOSIS — N950 Postmenopausal bleeding: Secondary | ICD-10-CM

## 2010-12-27 NOTE — Progress Notes (Signed)
Patient presents post op status post hysteroscopic resection endometrial polyp D&C. She's done well no pain no bleeding. I reviewed pathology findings with her which showed a benign endometrial polyp and atrophic endometrium.  Exam Abdomen: Soft nontender without masses guarding rebound organomegaly Pelvic: Atrophic changes external BUS vagina normal, cervix normal, uterus normal size midline mobile nontender, adnexa without masses or tenderness Assessment and plan: Postmenopausal bleeding status post hysteroscopic resection benign endometrial polyp. Patient will keep menstrual calendar as long as the bleeding will follow she is due for her annual in June 2013 she knows to follow up for this at that time. If she has any further bleeding she'll represent for further evaluation.

## 2010-12-29 ENCOUNTER — Encounter: Payer: Self-pay | Admitting: Gynecology

## 2011-05-26 ENCOUNTER — Telehealth: Payer: Self-pay | Admitting: Pulmonary Disease

## 2011-05-26 MED ORDER — HYDROCODONE-ACETAMINOPHEN 5-500 MG PO TABS: ORAL_TABLET | ORAL | Status: DC

## 2011-05-26 NOTE — Telephone Encounter (Signed)
Fax received from [facility] for 90 day supply on pt's Vicodin 5/500. Last written for #50 w/ 5 refills on 06/09/10. Med is not currently on pt's med list.  Last OV on 06/09/10. Pending OV on 06/13/11.If okay for refills, RX can be faxed to [phone]. Pls advise.

## 2011-05-26 NOTE — Telephone Encounter (Signed)
Per SN---ok to call in for the vicodin 5/500   90   1 po tid prn pain with 5 refills.  thanks

## 2011-05-26 NOTE — Telephone Encounter (Signed)
Pt's member ID #: [id number]. I have called rx into [facility] and spoke with will and gave him verbal for vicodin. Per leigh pt can not have a 3 month supply on 1 month supply at a time.

## 2011-06-13 ENCOUNTER — Encounter: Payer: Self-pay | Admitting: Pulmonary Disease

## 2011-06-13 ENCOUNTER — Telehealth: Payer: Self-pay | Admitting: Pulmonary Disease

## 2011-06-13 ENCOUNTER — Ambulatory Visit (INDEPENDENT_AMBULATORY_CARE_PROVIDER_SITE_OTHER): Payer: No Typology Code available for payment source | Admitting: Pulmonary Disease

## 2011-06-13 ENCOUNTER — Other Ambulatory Visit (INDEPENDENT_AMBULATORY_CARE_PROVIDER_SITE_OTHER): Payer: No Typology Code available for payment source

## 2011-06-13 ENCOUNTER — Ambulatory Visit (INDEPENDENT_AMBULATORY_CARE_PROVIDER_SITE_OTHER)
Admission: RE | Admit: 2011-06-13 | Discharge: 2011-06-13 | Disposition: A | Discharge status: Home / Self Care | Payer: No Typology Code available for payment source | Source: Ambulatory Visit | Attending: Pulmonary Disease | Admitting: Pulmonary Disease

## 2011-06-13 VITALS — BP 110/74 | HR 70 | Temp 97.0°F | Ht 63.0 in | Wt 193.8 lb

## 2011-06-13 DIAGNOSIS — M1990 Unspecified osteoarthritis, unspecified site: Secondary | ICD-10-CM

## 2011-06-13 DIAGNOSIS — I872 Venous insufficiency (chronic) (peripheral): Secondary | ICD-10-CM

## 2011-06-13 DIAGNOSIS — E785 Hyperlipidemia, unspecified: Secondary | ICD-10-CM

## 2011-06-13 DIAGNOSIS — D126 Benign neoplasm of colon, unspecified: Secondary | ICD-10-CM

## 2011-06-13 DIAGNOSIS — K219 Gastro-esophageal reflux disease without esophagitis: Secondary | ICD-10-CM

## 2011-06-13 DIAGNOSIS — I10 Essential (primary) hypertension: Secondary | ICD-10-CM

## 2011-06-13 DIAGNOSIS — E663 Overweight: Secondary | ICD-10-CM

## 2011-06-13 DIAGNOSIS — R7301 Impaired fasting glucose: Secondary | ICD-10-CM

## 2011-06-13 MED ORDER — POTASSIUM CHLORIDE CRYS ER 20 MEQ PO TBCR: 20.0000 meq | EXTENDED_RELEASE_TABLET | Freq: Two times a day (BID) | ORAL | Status: DC

## 2011-06-13 MED ORDER — SIMVASTATIN 20 MG PO TABS: 20.0000 mg | ORAL_TABLET | Freq: Every day | ORAL | Status: DC

## 2011-06-13 MED ORDER — LISINOPRIL 2.5 MG PO TABS: 2.5000 mg | ORAL_TABLET | Freq: Every day | ORAL | Status: DC

## 2011-06-13 MED ORDER — METHOCARBAMOL 500 MG PO TABS: 500.0000 mg | ORAL_TABLET | Freq: Three times a day (TID) | ORAL | Status: DC | PRN

## 2011-06-13 MED ORDER — HYDROCODONE-ACETAMINOPHEN 5-500 MG PO TABS: ORAL_TABLET | ORAL | Status: AC

## 2011-06-13 MED ORDER — ATENOLOL-CHLORTHALIDONE 50-25 MG PO TABS: 1.0000 | ORAL_TABLET | Freq: Every day | ORAL | Status: DC

## 2011-06-13 NOTE — Telephone Encounter (Signed)
Will send to [facility] as FYI to keep a lookout for labs as she had labs sent to [place].

## 2011-06-13 NOTE — Patient Instructions (Signed)
Today we updated your med list in our EPIC system...    Continue your current medications the same...    We refilled your meds per request...  Today we did your follow up CXR & fasting blood work that we sent to [facility]...    Please call the PHONE TREE in a few days for your results...    Dial [phone] & when prompted enter your patient number followed by the # symbol...    Your patient number is:  [id number]  Call for any problems...  Let's plan another physical in 1 years time, but call sooner if needed for problems.Marland KitchenMarland Kitchen

## 2011-06-13 NOTE — Progress Notes (Signed)
Subjective:     Patient ID: [name], female   DOB: 09-25-1950, 61 y.o.   MRN: [id number]  HPI 61 y/o WF here for a follow up visit... she has multiple medical problems as noted below...  ~  June 09, 2009:  routine 61 mof/u doing well- had recent sinus infection Rx w/ Avelox, Astepro per ENT... hx sm nodule w/ no progression serially & today's CXR similar (can't see it)... BP controlled on meds;  Chol has been OK on diet + Simva20;  hasn't been effective w/ diet/ ex "I'm working on it";  mult somatic complaints improved by regular massage therapy...  ~  December 07, 2009:  she saw TP 3 weeks ago after fall w/ minor trauma & HA- no LOC, no neuro signs, she chose to see Chiropractor & continues w/ massage therapy & improved... she takes over a dozen vits & supplements; plus she had her own RAST tests done via the internet from "[employer] [place] (in the "AVOID" category= eggs (whites & yolks), & pineapples, but also had signif reactions to fish, garlic, soybean, rye, & corn (tests scanned into the EMR)... BP remains controlled;  Lipids looked OK;  and she is working on her weight...  ~  June 09, 2010:  6 mo ROV- doing better on her elimination diet, elim the foods she was sensitive to on the RAST testing & she notes that when she eats something she isn't supposed to-then she gets aching joints in addition to "a burning oozing diarrhea"... weight is down 12# to 190# & she is encouraged to keep up the good work... BP controlled on meds;  she is stable on the Simva20 but labs have to go to [facility] & are "pending";  with her wt reduction we anticipate the BP, Chol & BS will all be better & easier to manage...  ~  June 13, 2011:  Yearly ROV & CPX> [employer] insurance [facility]io[facility]    AbnCXR> Hx sm RUL nodule, likely granuloma w/o growth serially; CXR 3/13 w/o nodule seen; she remains asymptomatic w/o cough, sputum,  hemoptysis, SOB, etc...    HBP> on ASA81, Tenoretic50/12.5, Lisin2.5, K20Bid; BP= 110/74 & similar at home; she denies CP, palpit, dizzy, SOB, edema; labs show norm electrolytes & renal> K=5.2    Ven Insuffic> on low sodium diet, elevation & support hose as needed...    Hyperlipidemia> on Simva20, Niacin500Bid, CoQ10, & diet; FLP showed TChol 146, TG 95, HDL 41, LDL 86    Hx Impaired Fasting Glucose> Hx borderline FBS in the past, diet Rx; labs 3/13 showed BS=543 and A1c= 5.9; not on meds just diet Rx & trying to lose wt.    Overweight> weight = 194# today; she has fluctuated 190-204#...    GI> GERD, Polyps> she uses OTC Prilosec as needed & her last colonoscopy 6/09 showed several hyperpl polyps; f/u planned 558yr    DJD, Hx plantar fasciitis> on Vicodin prn + calcium, MVI, VitD...    Anxiety> she does not want anxiolytic therapy... CXR 3/13 showed normal heart size, clear lungs, NAD... LABS 3/13 done at [facility]:  [place] goals;  Chems- wnl;  CBC- wnl;  A1c=5.9;  VitD=41;  UA=clear   PROBLEM LIST:    ABNORMAL CHEST XRAY (ICD-793.1) - CXR 3/09 w/ 20m32mUL nodule seen... serial films showed no growth & it was difficult to see at all on 12/09 CXR... nonsmoker, exposed to  husb second hand smoke. ~  f/u CXR 12/09 showed nodule ?obscured by ribs, NAD.Marland Kitchen. ~  CXR 3/11 showed clear lungs, no nodule seen... ~  CXR 2/12 showed WNL, NAD.Marland Kitchen. ~  CXR 3/13 showed normal heart size, clear lungs, NAD...  HYPERTENSION (ICD-401.9) - controlled on TENORETIC 50/25 daily, ZESTRIL 2.34m/d, K20Bid... BP's at home are all in the 120/80 range and BP= 110/74 today> taking meds regularly and tol well; denies HA, visual changes, CP, palipit, dizziness, syncope, dyspnea, fatigue, edema, etc...   VENOUS INSUFFICIENCY (ICD-459.81) - she tries to follow a sodium restricted diet... she has VI and chr ven insuffic changes/ chr edema... we discussed 2 gm Na+ restrictions and TED hose...  HYPERLIPIDEMIA (ICD-272.4) - on SIMVASTATIN  259md + NIACIN 50024mid & CoQ10 +diet efforts... ~  labs 9/08 on Simva20 showed TChol 148, TG 118, HDL 31, LDL 94 ~  [facility] showed TChol 234, TG 151, HDL 34, LDL 170... rec> restart ZOCOR. ~  [facility] showed TChol 157, TG 106, HDL 40, LDL 96 ~  [facility] TChol 151, TG 116, HDL 38, LDL 90 ~  Labs 3/13 at [facility] on Simva20+Niacin500Bid showed TChol 146, TG 95, HDL 41, LDL 86   Hx IMPAIRED FASTING GLUCOSE >> +FamHx DM and borderline labs in past... ~  labs 9/08 (wt= 206#) showed BS= 114, A1c= 6.2 on diet alone... ~  [facility] [place] ~  labs 3/11 at [facility] showed BS= 91, A1c= 5.8 ~  Labs 3/13 showed BS= 54, A1c= 5.9  OBESITY (ICD-278.00) - weight stable at about 200#... needs better diet and exercise program and we discussed this today... ~  weight 3/11 = 204# ~  weight 8/11 = 202# ~  weight 2/12 = 190# ~  Weight n3/13 = 194#  GASTROESOPHAGEAL REFLUX DISEASE (ICD-530.81) - on OMEPRAZOLE Prn (she stopped when diet made her better)... last EGD was 3/06 showing HH and gastritis...   COLONIC POLYPS (ICD-211.3) ~  colonoscopy 10/04 by DrStark showed several 2mm50mlyps (bx=minute frag of colonic mucosa only)... ~  colonoscopy 6/09 showed mult 3-4mm 68myps= hyperplastic on bx, +melanosis coli... f/u 32yrs.73yrGENERATIVE JOINT DISEASE (ICD-715.90) - prev on Etodolac but stopped since it upset her stomach... ~  she sees a chiropractor monthly, and massage therapist every 2-3 weeks (helps better than anything).  FASCIITIS, PLANTAR (ICD-728.71) - she had "sonic wave" therapy from podiatry which helped somewhat...  ANXIETY (ICD-300.00)  Health Maintenance: ~  GI:  followed by DrStark ~  GYN:  followed by DrGottsegen, & he follows her BMDs too; Mammograms at [facility] Immunizations:  she used to get the yearly Flu vaccine but she stopped when she found out she was allergic to eggs on the internet rast  testing... OK TDAP today.   Past Surgical History  Procedure Date  . Oophorectomy 2000    RSO   . Tubal ligation   . Knee surgery 2008    arthroscopic  . Mouth surgery   . Nose surgery     deviated septum  . Back surgery   . Gynecologic cryosurgery     CIN  . Hysteroscopy w/d&c 11/2010    endometrial polyp  . Hysteroscopy 8.28.12    Outpatient Encounter Prescriptions as of 06/13/2011  Medication Sig Dispense Refill  . aspirin 81 MG tablet Take 81 mg by mouth daily.        .Marland Kitchen  atenolol-chlorthalidone (TENORETIC) 50-25 MG per tablet Take 1 tablet by mouth daily.        . Calcium Carbonate-Vitamin D (CALCIUM + D PO) Take by mouth daily.        . cholecalciferol (VITAMIN D) 1000 UNITS tablet Take 1,000 Units by mouth daily.        Marland Kitchen HYDROcodone-acetaminophen (VICODIN) 5-500 MG per tablet Take 1 tablet 3 times a day as needed for pain  90 tablet  5  . lisinopril (PRINIVIL,ZESTRIL) 2.5 MG tablet Take 2.5 mg by mouth daily.      . Multiple Vitamin (MULTIVITAMIN) tablet Take 1 tablet by mouth daily.        Marland Kitchen POTASSIUM CHLORIDE CR PO Take by mouth daily.        Marland Kitchen SIMVASTATIN PO Take by mouth daily.        Marland Kitchen DISCONTD: LISINOPRIL PO Take by mouth daily.         Allergies  Allergen Reactions  . Etodolac     REACTION: intolerant  causes stomach cramps  . Omeprazole     REACTION: intolerant causes stomach cramps  . Other Diarrhea    eggs    Current Medications, Allergies, Past Medical History, Past Surgical History, Family History, and Social History were reviewed in [employer] record.     Review of Systems        See HPI - all other systems neg except as noted...  The patient denies anorexia, fever, weight loss, weight gain, vision loss, decreased hearing, hoarseness, chest pain, syncope, dyspnea on exertion, peripheral edema, prolonged cough, headaches, hemoptysis, abdominal pain, melena, hematochezia, severe indigestion/heartburn, hematuria, incontinence,  muscle weakness, suspicious skin lesions, transient blindness, difficulty walking, depression, unusual weight change, abnormal bleeding, enlarged lymph nodes, and angioedema.     Objective:   Physical Exam     WD, Overweight, 61 y/o WF in NAD... GENERAL:  Alert & oriented; pleasant & cooperative... HEENT:  [place]/AT, EOM-wnl, PERRLA, EACs-clear, TMs-wnl, NOSE-clear, THROAT-clear & wnl. NECK:  Supple w/ fairROM; no JVD; normal carotid impulses w/o bruits; no thyromegaly or nodules palpated; no lymphadenopathy. CHEST:   Clear without wheezing, rales, rhonchi heard... HEART:  Regular Rhythm; without murmurs/ rubs/ or gallops detected... ABDOMEN:  Obese, soft & nontender; normal bowel sounds; no organomegaly or masses detected. EXT: without deformities, mild arthritic changes; no varicose veins/ +venous insuffic & chr changes/ 1+ edema. NEURO:  CN's intact; no focal neuro deficits... DERM:  No lesions noted; no rash etc..  RADIOLOGY DATA:  Reviewed in the EPIC EMR & discussed w/ the patient...  LABORATORY DATA:  Reviewed in the EPIC EMR & discussed w/ the patient...   Assessment:     AbnCXR> Hx sm RUL nodule, likely granuloma w/o growth serially; CXR 3/13 w/o nodule seen & we will continue to follow...     HBP> on Tenoretic50/12.5, Lisin2.5, K20Bid; BP= 110/74 & similar at home; well controlled, continue the same...     Ven Insuffic> on low sodium diet, elevation & support hose as needed...     Hyperlipidemia> on Simva20, Niacin500Bid, CoQ10, & diet; FLP looks good, at the goals, continue the same...     Hx Impaired Fasting Glucose> Hx borderline FBS in the past, diet Rx; labs 3/13 look good...     Overweight> weight = 194# today; she has fluctuated 190-204#...     GI> GERD, Polyps> she uses OTC Prilosec as needed & her last colonoscopy 6/09 showed several hyperpl polyps; f/u planned 48yr.  DJD, Hx plantar fasciitis> on Vicodin prn + calcium, MVI, VitD...     Anxiety> she does not  want anxiolytic therapy...     Plan:     Patient's Medications  New Prescriptions   POTASSIUM CHLORIDE SA (K-DUR,KLOR-CON) 20 MEQ TABLET    Take 1 tablet (20 mEq total) by mouth 2 (two) times daily.   SIMVASTATIN (ZOCOR) 20 MG TABLET    Take 1 tablet (20 mg total) by mouth daily.  Previous Medications   ASPIRIN 81 MG TABLET    Take 81 mg by mouth daily.     CALCIUM CARBONATE-VITAMIN D (CALCIUM + D PO)    Take by mouth daily.     CHOLECALCIFEROL (VITAMIN D) 1000 UNITS TABLET    Take 1,000 Units by mouth daily.     MULTIPLE VITAMIN (MULTIVITAMIN) TABLET    Take 1 tablet by mouth daily.    Modified Medications   Modified Medication Previous Medication   ATENOLOL-CHLORTHALIDONE (TENORETIC) 50-25 MG PER TABLET atenolol-chlorthalidone (TENORETIC) 50-25 MG per tablet      Take 1 tablet by mouth daily.    Take 1 tablet by mouth daily.     HYDROCODONE-ACETAMINOPHEN (VICODIN) 5-500 MG PER TABLET HYDROcodone-acetaminophen (VICODIN) 5-500 MG per tablet      Take 1 tablet 3 times a day as needed for pain    Take 1 tablet 3 times a day as needed for pain   LISINOPRIL (PRINIVIL,ZESTRIL) 2.5 MG TABLET lisinopril (PRINIVIL,ZESTRIL) 2.5 MG tablet      Take 1 tablet (2.5 mg total) by mouth daily.    Take 2.5 mg by mouth daily.   METHOCARBAMOL (ROBAXIN) 500 MG TABLET methocarbamol (ROBAXIN) 500 MG tablet      Take 1 tablet (500 mg total) by mouth 3 (three) times daily as needed.    Take 1 tablet by mouth Three times daily as needed.  Discontinued Medications   LISINOPRIL PO    Take by mouth daily.    POTASSIUM CHLORIDE CR PO    Take by mouth daily.     SIMVASTATIN PO    Take by mouth daily.

## 2011-06-22 NOTE — Telephone Encounter (Signed)
Leigh, have you received the labs yet on this pt? Please advise

## 2011-06-28 NOTE — Telephone Encounter (Signed)
Labs received from [place] for Dr Jeannine Kitten review before we can mail copy to pt.

## 2011-06-28 NOTE — Telephone Encounter (Signed)
Copy of labs have been placed in the mail to the pt today

## 2011-07-04 ENCOUNTER — Telehealth: Payer: Self-pay | Admitting: Pulmonary Disease

## 2011-07-04 NOTE — Telephone Encounter (Signed)
We had to call [facility] to get her labs sent to [place].  Once they have faxed these over and SN has reviewed her labs we will call her with the results.  thanks

## 2011-07-04 NOTE — Telephone Encounter (Signed)
lmomtcb x1 

## 2011-07-04 NOTE — Telephone Encounter (Signed)
Called and spoke with pt.  Pt is requesting her lab results from 06/13/11.  Sn, please advise.  Thanks!

## 2011-07-05 NOTE — Telephone Encounter (Signed)
Called and spoke with pt and she is aware of lab results per SN.  Per SN--labs look good--cont the same. Sugars are normal--chems are ok, vit d is normal, cbcd and ua are normal.

## 2011-07-05 NOTE — Telephone Encounter (Signed)
Lab results received from [facility] and placed on SN cart for review.

## 2011-07-05 NOTE — Telephone Encounter (Signed)
Advised pt we are awaiting SN to review the labs & that we will call her once the labs have been reviewed.  Pt stated she rec'd a letter in the mail & has attempted to reach the phone tree.  Pt will await returned phone call & stated nothing further needed at this time.  [name]

## 2011-07-05 NOTE — Telephone Encounter (Signed)
lmomtcb for pt 

## 2011-07-05 NOTE — Telephone Encounter (Signed)
No lab results received as of yet per Leigh.  She advised to call [place] lab b/c she was told the results would come to them.  Call [facility] lab, spoke with Lurline Idol who stated that do not receive these results but provided the number and account number to [facility]: [phone] acct# [id number].  Called [facility], spoke with [facility] who reported that the results had been faxed to a number that was unfamiliar to me.  Gave him the triage fax number and was informed the results will be sent.  Will forward to Leigh to await results.

## 2011-07-08 ENCOUNTER — Encounter: Payer: Self-pay | Admitting: Pulmonary Disease

## 2011-09-02 ENCOUNTER — Encounter: Payer: Self-pay | Admitting: Pulmonary Disease

## 2011-09-28 ENCOUNTER — Ambulatory Visit (INDEPENDENT_AMBULATORY_CARE_PROVIDER_SITE_OTHER): Payer: No Typology Code available for payment source | Admitting: Obstetrics and Gynecology

## 2011-09-28 ENCOUNTER — Encounter: Payer: Self-pay | Admitting: Obstetrics and Gynecology

## 2011-09-28 VITALS — BP 130/90 | Ht 62.25 in | Wt 194.0 lb

## 2011-09-28 DIAGNOSIS — Z01419 Encounter for gynecological examination (general) (routine) without abnormal findings: Secondary | ICD-10-CM

## 2011-09-28 NOTE — Progress Notes (Signed)
Patient came to see me in a day for her annual GYN exam. She is due for a mammogram and will call and make an appointment. She is due for followup bone density due to osteopenia without an elevated FRAX risk and will make an appointment for that as well. She has had no fractures. She takes calcium and vitamin D. She is having no vaginal bleeding. She is having no pelvic pain. She does well without HRT. Her blood pressure was borderline elevated today but has been fine otherwise and is monitored since she is hypertensive. 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 She has had normal Pap smears since then. Her last Pap was June 20 12.  Physical examination: Earnest Bailey present. HEENT within normal limits. Neck: Thyroid not large. No masses. Supraclavicular nodes: not enlarged. Breasts: Examined in both sitting and lying  position. No skin changes and no masses. Abdomen: Soft no guarding rebound or masses or hernia. Pelvic: External: Within normal limits. BUS: Within normal limits. Vaginal:within normal limits. Good estrogen effect. No evidence of cystocele rectocele or enterocele. Cervix: clean. Uterus: Normal size and shape. Adnexa: No masses. Rectovaginal exam: Confirmatory and negative. Extremities: Within normal limits.  Assessment: #1. Osteopenia #2. CIN  Plan: Mammogram and  bone density. No Pap done today.

## 2011-11-17 ENCOUNTER — Other Ambulatory Visit: Payer: Self-pay | Admitting: Obstetrics and Gynecology

## 2011-11-17 DIAGNOSIS — Z13820 Encounter for screening for osteoporosis: Secondary | ICD-10-CM

## 2011-11-22 ENCOUNTER — Ambulatory Visit (INDEPENDENT_AMBULATORY_CARE_PROVIDER_SITE_OTHER): Payer: No Typology Code available for payment source

## 2011-11-22 ENCOUNTER — Other Ambulatory Visit: Payer: Self-pay | Admitting: Pulmonary Disease

## 2011-11-22 DIAGNOSIS — M8580 Other specified disorders of bone density and structure, unspecified site: Secondary | ICD-10-CM

## 2011-11-22 DIAGNOSIS — Z13820 Encounter for screening for osteoporosis: Secondary | ICD-10-CM

## 2011-11-22 DIAGNOSIS — M949 Disorder of cartilage, unspecified: Secondary | ICD-10-CM

## 2011-11-22 DIAGNOSIS — M899 Disorder of bone, unspecified: Secondary | ICD-10-CM

## 2011-12-21 ENCOUNTER — Ambulatory Visit: Payer: Self-pay | Admitting: Specialist

## 2012-05-28 ENCOUNTER — Other Ambulatory Visit: Payer: Self-pay | Admitting: Pulmonary Disease

## 2012-06-12 ENCOUNTER — Ambulatory Visit: Payer: No Typology Code available for payment source | Admitting: Pulmonary Disease

## 2012-06-25 ENCOUNTER — Telehealth: Payer: Self-pay | Admitting: Pulmonary Disease

## 2012-06-25 NOTE — Telephone Encounter (Signed)
Spoke with patient c/o sore throat, runny nose, watery eyes, chest congestion, prod cough with dark green mucus, and low grade fever x 1 week. Patient states she has been taking honey and Advil only.  Requesting recs from Dr. Lenna Gilford, please advise, thank you!  Last OV:06/13/11 Next OV:07/16/12  Allergies  Allergen Reactions  . Etodolac     REACTION: intolerant  causes stomach cramps  . Omeprazole     REACTION: intolerant causes stomach cramps  . Other Diarrhea    Eggs, pineapple

## 2012-06-26 ENCOUNTER — Encounter: Payer: Self-pay | Admitting: *Deleted

## 2012-06-26 ENCOUNTER — Ambulatory Visit: Payer: No Typology Code available for payment source | Admitting: Adult Health

## 2012-06-26 MED ORDER — LEVOFLOXACIN 500 MG PO TABS: 500.0000 mg | ORAL_TABLET | Freq: Every day | ORAL | Status: DC

## 2012-06-26 NOTE — Telephone Encounter (Signed)
Per SN----call in levaquin 500  #7  1 daily and mucinex otc fluids etc.  thanks

## 2012-06-26 NOTE — Telephone Encounter (Signed)
Pt aware of SN recs. I have sent RX to the pharmacy. Nothing further was needed

## 2012-07-16 ENCOUNTER — Encounter: Payer: Self-pay | Admitting: Pulmonary Disease

## 2012-07-16 ENCOUNTER — Ambulatory Visit (INDEPENDENT_AMBULATORY_CARE_PROVIDER_SITE_OTHER)
Admission: RE | Admit: 2012-07-16 | Discharge: 2012-07-16 | Disposition: A | Discharge status: Home / Self Care | Payer: No Typology Code available for payment source | Source: Ambulatory Visit | Attending: Pulmonary Disease | Admitting: Pulmonary Disease

## 2012-07-16 ENCOUNTER — Ambulatory Visit (INDEPENDENT_AMBULATORY_CARE_PROVIDER_SITE_OTHER): Payer: No Typology Code available for payment source | Admitting: Pulmonary Disease

## 2012-07-16 VITALS — BP 118/78 | HR 78 | Temp 97.9°F | Ht 63.0 in | Wt 194.0 lb

## 2012-07-16 DIAGNOSIS — E663 Overweight: Secondary | ICD-10-CM

## 2012-07-16 DIAGNOSIS — D126 Benign neoplasm of colon, unspecified: Secondary | ICD-10-CM

## 2012-07-16 DIAGNOSIS — R7301 Impaired fasting glucose: Secondary | ICD-10-CM

## 2012-07-16 DIAGNOSIS — K219 Gastro-esophageal reflux disease without esophagitis: Secondary | ICD-10-CM

## 2012-07-16 DIAGNOSIS — E785 Hyperlipidemia, unspecified: Secondary | ICD-10-CM

## 2012-07-16 DIAGNOSIS — I10 Essential (primary) hypertension: Secondary | ICD-10-CM

## 2012-07-16 DIAGNOSIS — I872 Venous insufficiency (chronic) (peripheral): Secondary | ICD-10-CM

## 2012-07-16 DIAGNOSIS — M1990 Unspecified osteoarthritis, unspecified site: Secondary | ICD-10-CM

## 2012-07-16 DIAGNOSIS — F411 Generalized anxiety disorder: Secondary | ICD-10-CM

## 2012-07-16 MED ORDER — LISINOPRIL 2.5 MG PO TABS: 2.5000 mg | ORAL_TABLET | Freq: Every day | ORAL | Status: DC

## 2012-07-16 MED ORDER — SIMVASTATIN 20 MG PO TABS: 20.0000 mg | ORAL_TABLET | Freq: Every day | ORAL | Status: DC

## 2012-07-16 MED ORDER — ATENOLOL-CHLORTHALIDONE 50-25 MG PO TABS: 1.0000 | ORAL_TABLET | Freq: Every day | ORAL | Status: DC

## 2012-07-16 MED ORDER — POTASSIUM CHLORIDE CRYS ER 20 MEQ PO TBCR: 20.0000 meq | EXTENDED_RELEASE_TABLET | Freq: Two times a day (BID) | ORAL | Status: DC

## 2012-07-16 NOTE — Progress Notes (Addendum)
Subjective:     Patient ID: [name], female   DOB: 01-22-1951, 62 y.o.   MRN: [id number]  HPI 62 y/o WF here for a follow up visit... she has multiple medical problems as noted below...  ~  June 09, 2010:  6 mo ROV- doing better on her elimination diet, elim the foods she was sensitive to on the RAST testing & she notes that when she eats something she isn't supposed to-then she gets aching joints in addition to "a burning oozing diarrhea"... weight is down 12# to 190# & she is encouraged to keep up the good work... BP controlled on meds;  she is stable on the Simva20 but labs have to go to [facility] & are "pending";  with her wt reduction we anticipate the BP, Chol & BS will all be better & easier to manage...  ~  June 13, 2011:  Yearly ROV & CPX> [employer] insurance requiores that labs go to [facility]...    AbnCXR> Hx sm RUL nodule, likely granuloma w/o growth serially; CXR 3/13 w/o nodule seen; she remains asymptomatic w/o cough, sputum, hemoptysis, SOB, etc...    HBP> on ASA81, Tenoretic50/12.5, Lisin2.5, K20Bid; BP= 110/74 & similar at home; she denies CP, palpit, dizzy, SOB, edema; labs show norm electrolytes & renal> K=5.2    Ven Insuffic> on low sodium diet, elevation & support hose as needed...    Hyperlipidemia> on Simva20, Niacin500Bid, CoQ10, & diet; FLP showed TChol 146, TG 95, HDL 41, LDL 86    Hx Impaired Fasting Glucose> Hx borderline FBS in the past, diet Rx; labs 3/13 showed BS=543 and A1c= 5.9; not on meds just diet Rx & trying to lose wt.    Overweight> weight = 194# today; she has fluctuated 190-204#...    GI> GERD, Polyps> she uses OTC Prilosec as needed & her last colonoscopy 6/09 showed several hyperpl polyps; f/u planned 51yr.    DJD, Hx plantar fasciitis> on Vicodin prn + calcium, MVI, VitD...    Anxiety> she does not want anxiolytic therapy... CXR 3/13 showed normal heart size, clear lungs, NAD... LABS 3/13 done at [facility]:  [place];  Chems- wnl;  CBC- wnl;   A1c=5.9;  VitD=41;  UA=clear   ~  July 16, 2012:  Yearly RMinneolahas had a good yr overall "Just getting older" & notes dental problems & arthritis;  She had a recent URI & we called in Levaquin- now resolved;  She hasn't really been on a diet other than to watch what she eats "due to my allergies";  She is due for a f/u colonoscopy this yr...  We reviewed the following medical problems during today's office visit >>      AbnCXR> Hx sm RUL nodule, likely granuloma w/o growth serially; CXR 4/14 w/o nodule seen; she remains asymptomatic w/o cough, sputum, hemoptysis, SOB, etc...    HBP> on ASA81, Tenoretic50/25, Lisin2.5, K20Bid; BP= 118/78 & similar at home; she denies CP, palpit, dizzy, SOB, edema...     Ven Insuffic> on low sodium diet (she has lots of room to improve), elevation & support hose as directed...    Hyperlipidemia> on Simva20, OTC Niacin500Bid, CoQ10, & diet; FLP shows TChol 160, TG 141, HDL39, LDL 93    Hx Impaired Fasting Glucose> Hx borderline FBS in the past, diet Rx; labs 4/14 showed BS=100 and A1c=not done ; we discussed diet, exercise, wt reduction...    Overweight> weight = 194# today (no change in 164yr she has  fluctuated 190-204# in recent yrs...    GI> GERD, Polyps> she uses OTC Prilosec as needed (depends on her diet) & her last colonoscopy 6/09 showed several hyperplastic polyps w/ f/u planned 71yr.    DJD, Hx plantar fasciitis> on OTC analgesics as needed; she still sees her chiro & massage therapist regularly    Osteopenia> she had BMD done 8/13 by DrGottsegen w/ results in Epic=> TScore -1.9 in right FemNeck; he rec Calcium, MVI, VitD, wt bearing exercise & repeat 244yr    Anxiety> she does not want anxiolytic therapy...  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 CXR 4/14 showed heart wnl in size, lungs clear, nodule not seen, DJD in spine, NAD... LABS 4/14:   [facility]     PROBLEM LIST:    ABNORMAL CHEST XRAY (ICD-793.1) - CXR 3/09 w/ 67m58mUL nodule seen... serial films showed no growth & it was difficult to see at all on 12/09 CXR... nonsmoker, exposed to husb second hand smoke. ~  f/u CXR 12/09 showed nodule ?obscured by ribs, NAD... Marland Kitchen  CXR 3/11 showed clear lungs, no nodule seen... ~  CXR 2/12 showed WNL, NAD... Marland Kitchen  CXR 3/13 showed normal heart size, clear lungs, NAD... Marland Kitchen  CXR 4/14 showed heart wnl in size, lungs clear, nodule not seen, DJD in spine, NAD...  HYPERTENSION (ICD-401.9) - controlled on TENORETIC 50/25 daily, ZESTRIL 2.5mg24m K20Bid...  ~  3/13:  BP's at home are all in the 120/80 range and BP= 110/74 today> taking meds regularly and tol well; denies HA, visual changes, CP, palipit, dizziness, syncope, dyspnea, fatigue, edema, etc...  ~  4/14:  on ASA81, Tenoretic50/25, Lisin2.5, K20Bid; BP= 118/78 & similar at home; she denies CP, palpit, dizzy, SOB, edema...  VENOUS INSUFFICIENCY (ICD-459.81) - she tries to follow a sodium restricted diet... she has VI and chr ven insuffic changes/ chr edema... we discussed 2 gm Na+ restrictions and TED hose...  HYPERLIPIDEMIA (ICD-272.4) - on SIMVASTATIN 20mg73m NIACIN 500mg 33m& CoQ10 +diet efforts... ~  labs 9/08 on Simva20 showed TChol 148, TG 118, HDL 31, LDL 94 ~  [facility] off Simva showed TChol 234, TG 151, HDL 34, LDL 170... rec> restart ZOCOR. ~  [facility] TCh[facility] 157, TG 106, HDL 40, LDL 96 ~  [facility] TChol 151, TG 116, HDL 38, LDL 90 ~  Labs 3/13 at [facility] on Simva20+Niacin500Bid showed TChol 146, TG 95, HDL 41, LDL 86  ~  [facility]/14 [facility] on simva20+Niacin500Bid showed TChol 160, TG 141, HDL39, LDL 93  Hx IMPAIRED FASTING GLUCOSE >> +FamHx DM and borderline labs in past... ~  labs 9/08 (wt= 206#) showed BS= 114, A1c= 6.2 on diet alone... ~  l[facility]/09 [facility] BS= 107 ~  labs 3/11 at [facility] showed  BS= 91, A1c= 5.8 ~  Labs 3/13 showed BS= 54, A1c= 5.9 ~  Labs 4/14 showed BS=100 & A1c wasn't done  OBESITY (ICD-278.00) - weight stable at about 200#... needs better diet and exercise program and we discussed this today... ~  weight 3/11 = 204# ~  weight 8/11 = 202# ~  weight 2/12 = 190# ~  Weight 3/13 = 194# ~  Weight 4/14 = 194#  GASTROESOPHAGEAL REFLUX DISEASE (ICD-530.81) - on OMEPRAZOLE Prn (she stopped when diet  made her better)... last EGD was 3/06 showing HH and gastritis...   COLONIC POLYPS (ICD-211.3) ~  colonoscopy 10/04 by DrStark showed several 63m polyps (bx=minute frag of colonic mucosa only)... ~  colonoscopy 6/09 showed mult 3-459mpolyps= hyperplastic on bx, +melanosis coli... f/u 5y27yr DEGENERATIVE JOINT DISEASE (ICD-715.90) - prev on Etodolac but stopped since it upset her stomach... ~  she sees a chiropractor monthly, and massage therapist every 2-3 weeks (helps better than anything). ~  she had BMD done 8/13 by DrGottsegen w/ results in Epic=> [facility]Score -1.9 [facility] rec Calcium, MVI, VitD, wt bearing exercise & repeat 53yr39yr  [facility] showed Vit D level = 42 & rec to continue OTC Vit D supplement...  FASCIITIS, PLANTAR (ICD-728.71) - she had "sonic wave" therapy from podiatry which helped somewhat...  ANXIETY (ICD-300.00)  Health Maintenance: ~  GI:  followed by DrStark w/ f/u colon planned 6/14... ~  GYN:  followed by DrGottsegen & seen 6/13, & he follows her BMDs too; [facility] >25 yrs ago & no prob since... ~  Immunizations:  she used to get the yearly Flu vaccine but she stopped when she found out she was allergic to eggs on the internet rast testing... TDAP given 2012.   Past Surgical History  Procedure Laterality Date  . Oophorectomy  2000    RSO   . Tubal ligation    . Knee surgery  2008    arthroscopic  . Mouth surgery    . Nose surgery      deviated septum  . Back surgery    . Gynecologic  cryosurgery      [facility]  . Hysteroscopy w/d&c  11/2010    endometrial polyp  . Hysteroscopy  8.28.12    Outpatient Encounter Prescriptions as of 07/16/2012  Medication Sig Dispense Refill  . aspirin 81 MG tablet Take 81 mg by mouth daily.        . atMarland Kitchennolol-chlorthalidone (TENORETIC) 50-25 MG per tablet Take 1 tablet by mouth daily.  90 tablet  3  . Calcium Carbonate-Vitamin D (CALCIUM + D PO) Take by mouth daily.        . cholecalciferol (VITAMIN D) 1000 UNITS tablet Take 1,000 Units by mouth daily.        . KLMarland KitchenR-CON M20 20 MEQ tablet TAKE 1 TABLET TWICE A DAY  180 tablet  0  . lisinopril (PRINIVIL,ZESTRIL) 2.5 MG tablet Take 1 tablet (2.5 mg total) by mouth daily.  90 tablet  3  . methocarbamol (ROBAXIN) 500 MG tablet Take 500 mg by mouth as needed.      . Multiple Vitamin (MULTIVITAMIN) tablet Take 1 tablet by mouth daily.        . simvastatin (ZOCOR) 20 MG tablet TAKE 1 TABLET DAILY  90 tablet  0  . [DISCONTINUED] levofloxacin (LEVAQUIN) 500 MG tablet Take 1 tablet (500 mg total) by mouth daily.  7 tablet  0   No facility-administered encounter medications on file as of 07/16/2012.    Allergies  Allergen Reactions  . Etodolac     REACTION: intolerant  causes stomach cramps  . Omeprazole     REACTION: intolerant causes stomach cramps  . Other Diarrhea    Eggs, pineapple    Current Medications, Allergies, Past Medical History, Past Surgical History, Family History, and Social History were reviewed in [employer].     Review of Systems  See HPI - all other systems neg except as noted...  The patient denies anorexia, fever, weight loss, weight gain, vision loss, decreased hearing, hoarseness, chest pain, syncope, dyspnea on exertion, peripheral edema, prolonged cough, headaches, hemoptysis, abdominal pain, melena, hematochezia, severe indigestion/heartburn, hematuria, incontinence, muscle weakness, suspicious skin lesions, transient blindness,  difficulty walking, depression, unusual weight change, abnormal bleeding, enlarged lymph nodes, and angioedema.     Objective:   Physical Exam     WD, Overweight, 62 y/o WF in NAD... GENERAL:  Alert & oriented; pleasant & cooperative... HEENT:  [place]/AT, EOM-wnl, PERRLA, EACs-clear, TMs-wnl, NOSE-clear, THROAT-clear & wnl. NECK:  Supple w/ fairROM; no JVD; normal carotid impulses w/o bruits; no thyromegaly or nodules palpated; no lymphadenopathy. CHEST:   Clear without wheezing, rales, rhonchi heard... HEART:  Regular Rhythm; without murmurs/ rubs/ or gallops detected... ABDOMEN:  Obese, soft & nontender; normal bowel sounds; no organomegaly or masses detected. EXT: without deformities, mild arthritic changes; no varicose veins/ +venous insuffic & chr changes/ 1+ edema. NEURO:  CN's intact; no focal neuro deficits... DERM:  No lesions noted; no rash etc..  RADIOLOGY DATA:  Reviewed in the EPIC EMR & discussed w/ the patient...  LABORATORY DATA:  Reviewed in the EPIC EMR & discussed w/ the patient...   Assessment:      AbnCXR> Hx sm RUL nodule, likely granuloma w/o growth serially; CXR 4/14 w/o nodule seen & we will continue to follow...     HBP> on Tenoretic50/12.5, Lisin2.5, K20Bid; BP= 118/78 & similar at home; well controlled, continue the same...     Ven Insuffic> on low sodium diet, elevation & support hose as needed...     Hyperlipidemia> on Simva20, off Niacin, CoQ10, & diet; FLP is ok- continue same...     Hx Impaired Fasting Glucose> Hx borderline FBS in the past, diet Rx; labs 4/14 showed BS=100...     Overweight> weight = 194# today; she has fluctuated 190-204#...     GI> GERD, Polyps> she uses OTC Prilosec as needed & her last colonoscopy 6/09 showed several hyperpl polyps; f/u planned 75yr.     DJD, Hx plantar fasciitis> on Vicodin prn + calcium, MVI, VitD (level =42)...     Anxiety> she does not want anxiolytic therapy...     Plan:     Patient's Medications   New Prescriptions   No medications on file  Previous Medications   ASPIRIN 81 MG TABLET    Take 81 mg by mouth daily.     CALCIUM CARBONATE-VITAMIN D (CALCIUM + D PO)    Take by mouth daily.     CHOLECALCIFEROL (VITAMIN D) 1000 UNITS TABLET    Take 1,000 Units by mouth daily.     METHOCARBAMOL (ROBAXIN) 500 MG TABLET    Take 500 mg by mouth as needed.   MULTIPLE VITAMIN (MULTIVITAMIN) TABLET    Take 1 tablet by mouth daily.    Modified Medications   Modified Medication Previous Medication   ATENOLOL-CHLORTHALIDONE (TENORETIC) 50-25 MG PER TABLET atenolol-chlorthalidone (TENORETIC) 50-25 MG per tablet      Take 1 tablet by mouth daily.    Take 1 tablet by mouth daily.   LISINOPRIL (PRINIVIL,ZESTRIL) 2.5 MG TABLET lisinopril (PRINIVIL,ZESTRIL) 2.5 MG tablet      Take 1 tablet (2.5 mg total) by mouth daily.    Take 1 tablet (2.5 mg total) by mouth daily.   POTASSIUM CHLORIDE SA (KLOR-CON M20) 20 MEQ TABLET KLOR-CON M20 20 MEQ tablet      Take 1  tablet (20 mEq total) by mouth 2 (two) times daily.    TAKE 1 TABLET TWICE A DAY   SIMVASTATIN (ZOCOR) 20 MG TABLET simvastatin (ZOCOR) 20 MG tablet      Take 1 tablet (20 mg total) by mouth at bedtime.    TAKE 1 TABLET DAILY  Discontinued Medications   LEVOFLOXACIN (LEVAQUIN) 500 MG TABLET    Take 1 tablet (500 mg total) by mouth daily.

## 2012-07-16 NOTE — Patient Instructions (Addendum)
Today we updated your med list in our EPIC system...    Continue your current medications the same...    We refilled your meds per request...  Today we did your follow up CXR... Please returnn to our lab one morning this week for your FASTING blood work...    We will contact you w/ the results when available...   Let's get on track w/ our diet & exercise program...    The goal is to lose 10-15 lbs...  Call for any questions...  Let's plan a follow up visit in 59yr sooner if needed for problems..Marland KitchenMarland Kitchen

## 2012-07-20 ENCOUNTER — Other Ambulatory Visit: Payer: No Typology Code available for payment source

## 2012-07-20 DIAGNOSIS — I10 Essential (primary) hypertension: Secondary | ICD-10-CM

## 2012-07-20 DIAGNOSIS — M1990 Unspecified osteoarthritis, unspecified site: Secondary | ICD-10-CM

## 2012-07-20 DIAGNOSIS — D126 Benign neoplasm of colon, unspecified: Secondary | ICD-10-CM

## 2012-07-20 DIAGNOSIS — E785 Hyperlipidemia, unspecified: Secondary | ICD-10-CM

## 2012-07-20 DIAGNOSIS — F411 Generalized anxiety disorder: Secondary | ICD-10-CM

## 2012-07-24 ENCOUNTER — Encounter: Payer: Self-pay | Admitting: *Deleted

## 2012-07-24 NOTE — Progress Notes (Signed)
Quick Note:  Pt notified via [place]. Called spoke with Lurline Idol in the lab - pt returned for fasting labs on 4.11.14, and requested they be sent to [facility] for resulting. ______

## 2012-08-06 ENCOUNTER — Ambulatory Visit (INDEPENDENT_AMBULATORY_CARE_PROVIDER_SITE_OTHER): Payer: No Typology Code available for payment source | Admitting: Adult Health

## 2012-08-06 ENCOUNTER — Encounter: Payer: Self-pay | Admitting: Adult Health

## 2012-08-06 ENCOUNTER — Other Ambulatory Visit (INDEPENDENT_AMBULATORY_CARE_PROVIDER_SITE_OTHER): Payer: No Typology Code available for payment source

## 2012-08-06 VITALS — BP 126/72 | HR 71 | Temp 97.1°F | Ht 63.0 in | Wt 190.2 lb

## 2012-08-06 DIAGNOSIS — Z5189 Encounter for other specified aftercare: Secondary | ICD-10-CM

## 2012-08-06 DIAGNOSIS — S80869D Insect bite (nonvenomous), unspecified lower leg, subsequent encounter: Secondary | ICD-10-CM

## 2012-08-06 DIAGNOSIS — W57XXXA Bitten or stung by nonvenomous insect and other nonvenomous arthropods, initial encounter: Secondary | ICD-10-CM | POA: Insufficient documentation

## 2012-08-06 DIAGNOSIS — L98491 Non-pressure chronic ulcer of skin of other sites limited to breakdown of skin: Secondary | ICD-10-CM

## 2012-08-06 DIAGNOSIS — L98499 Non-pressure chronic ulcer of skin of other sites with unspecified severity: Secondary | ICD-10-CM | POA: Insufficient documentation

## 2012-08-06 LAB — BASIC METABOLIC PANEL
CO2: 28 mEq/L (ref 19–32)
Chloride: 102 mEq/L (ref 96–112)
Glucose, Bld: 90 mg/dL (ref 70–99)
Potassium: 4 mEq/L (ref 3.5–5.1)
Sodium: 138 mEq/L (ref 135–145)

## 2012-08-06 LAB — CBC WITH DIFFERENTIAL/PLATELET
Eosinophils Relative: 3.4 % (ref 0.0–5.0)
MCV: 89.8 fl (ref 78.0–100.0)
Monocytes Absolute: 1.2 10*3/uL — ABNORMAL HIGH (ref 0.1–1.0)
Monocytes Relative: 12 % (ref 3.0–12.0)
Neutrophils Relative %: 47.1 % (ref 43.0–77.0)
Platelets: 219 10*3/uL (ref 150.0–400.0)
WBC: 9.7 10*3/uL (ref 4.5–10.5)

## 2012-08-06 LAB — HEPATIC FUNCTION PANEL
AST: 43 U/L — ABNORMAL HIGH (ref 0–37)
Total Bilirubin: 0.6 mg/dL (ref 0.3–1.2)

## 2012-08-06 LAB — SEDIMENTATION RATE: Sed Rate: 24 mm/hr — ABNORMAL HIGH (ref 0–22)

## 2012-08-06 MED ORDER — DOXYCYCLINE HYCLATE 100 MG PO TABS: 100.0000 mg | ORAL_TABLET | Freq: Two times a day (BID) | ORAL | Status: DC

## 2012-08-06 NOTE — Progress Notes (Signed)
Subjective:     Patient ID: [name], female   DOB: April 04, 1951, 62 y.o.   MRN: [id number]  HPI 62 y/o WF   ~  June 09, 2010:  6 mo ROV- doing better on her elimination diet, elim the foods she was sensitive to on the RAST testing & she notes that when she eats something she isn't supposed to-then she gets aching joints in addition to "a burning oozing diarrhea"... weight is down 12# to 190# & she is encouraged to keep up the good work... BP controlled on meds;  she is stable on the Simva20 but labs have to go to [facility] & are "pending";  with her wt reduction we anticipate the BP, Chol & BS will all be better & easier to manage...  ~  June 13, 2011:  Yearly ROV & CPX> [employer] insurance requiores that labs go to [facility]...    AbnCXR> Hx sm RUL nodule, likely granuloma w/o growth serially; CXR 3/13 w/o nodule seen; she remains asymptomatic w/o cough, sputum, hemoptysis, SOB, etc...    HBP> on ASA81, Tenoretic50/12.5, Lisin2.5, K20Bid; BP= 110/74 & similar at home; she denies CP, palpit, dizzy, SOB, edema; labs show norm electrolytes & renal> K=5.2    Ven Insuffic> on low sodium diet, elevation & support hose as needed...    Hyperlipidemia> on Simva20, Niacin500Bid, CoQ10, & diet; FLP showed TChol 146, TG 95, HDL 41, LDL 86    Hx Impaired Fasting Glucose> Hx borderline FBS in the past, diet Rx; labs 3/13 showed BS=543 and A1c= 5.9; not on meds just diet Rx & trying to lose wt.    Overweight> weight = 194# today; she has fluctuated 190-204#...    GI> GERD, Polyps> she uses OTC Prilosec as needed & her last colonoscopy 6/09 showed several hyperpl polyps; f/u planned 41yr.    DJD, Hx plantar fasciitis> on Vicodin prn + calcium, MVI, VitD...    Anxiety> she does not want anxiolytic therapy... CXR 3/13 showed normal heart size, clear lungs, NAD... LABS 3/13 done at [facility]:  F[place] at goals;  Chems- wnl;  CBC- wnl;  A1c=5.9;  VitD=41;  UA=clear   ~  July 16, 2012:  Yearly RLoganhas had a  good yr overall "Just getting older" & notes dental problems & arthritis;  She had a recent URI & we called in Levaquin- now resolved;  She hasn't really been on a diet other than to watch what she eats "due to my allergies";  She is due for a f/u colonoscopy this yr...  We reviewed the following medical problems during today's office visit >>      AbnCXR> Hx sm RUL nodule, likely granuloma w/o growth serially; CXR 4/14 w/o nodule seen; she remains asymptomatic w/o cough, sputum, hemoptysis, SOB, etc...    HBP> on ASA81, Tenoretic50/25, Lisin2.5, K20Bid; BP= 118/78 & similar at home; she denies CP, palpit, dizzy, SOB, edema...     Ven Insuffic> on low sodium diet (she has lots of room to improve), elevation & support hose as directed...    Hyperlipidemia> on Simva20, OTC Niacin500Bid, CoQ10, & diet; FLP shows TChol 160, TG 141, HDL39, LDL 93    Hx Impaired Fasting Glucose> Hx borderline FBS in the past, diet Rx; labs 4/14 showed BS=100 and A1c=not done ; we discussed diet, exercise, wt reduction...    Overweight> weight = 194# today (no change in 124yr she has fluctuated 190-204# in recent yrs...    GI> GERD, Polyps> she uses  OTC Prilosec as needed (depends on her diet) & her last colonoscopy 6/09 showed several hyperplastic polyps w/ f/u planned 5yr.    DJD, Hx plantar fasciitis> on OTC analgesics as needed; she still sees her chiro & massage therapist regularly    Osteopenia> she had BMD done 8/13 by DrGottsegen w/ results in Epic=> TScore -1.9 in right FemNeck; he rec Calcium, MVI, VitD, wt bearing exercise & repeat 255yr    Anxiety> she does not want anxiolytic therapy...  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 CXR 4/14 showed heart wnl in size, lungs clear, nodule not seen, DJD in spine, NAD... LABS 4/14:  [facility]   08/06/2012 Acute OV  Complains of tick bite x2 weeks,  finished doxycycline with no relief. Noticed she had a tick along her right lower buttock /upper thigh ~07/18/12 . Tick was attached. Not engorged. ?unknown time present. Walks dogs frequently outside.  Around a week later noticed she had an irritated spot along the area, took a picture of the area . Red circular area , pruritic at times. Placed neosporin and cortisporin on area.  Around 4/17 developed nausea,vomitting, body aches.chills and feverish w/  Some diarrhea. Felt bad all over. She called on call MD on 4/19, explained above symptoms. Called in Doxycycline x 7 d. Feeling some better with resolution of n/v/d and fever. Still feels some weakness/low energy.  Noticed along left inner buttock she has an irriatated place on skin. Has been using antiinfecitve cleanser -not getting better over last several days.  No rash, joint swelling, extremity weakness, or muscle twitching. No fevers or bloody stools.   No known sick contacts. No recent travel .   PROBLEM LIST:    ABNORMAL CHEST XRAY (ICD-793.1) - CXR 3/09 w/ 37m34mUL nodule seen... serial films showed no growth & it was difficult to see at all on 12/09 CXR... nonsmoker, exposed to husb second hand smoke. ~  f/u CXR 12/09 showed nodule ?obscured by ribs, NAD... Marland Kitchen  CXR 3/11 showed clear lungs, no nodule seen... ~  CXR 2/12 showed WNL, NAD... Marland Kitchen  CXR 3/13 showed normal heart size, clear lungs, NAD... Marland Kitchen  CXR 4/14 showed heart wnl in size, lungs clear, nodule not seen, DJD in spine, NAD...  HYPERTENSION (ICD-401.9) - controlled on TENORETIC 50/25 daily, ZESTRIL 2.5mg43m K20Bid...  ~  3/13:  BP's at home are all in the 120/80 range and BP= 110/74 today> taking meds regularly and tol well; denies HA, visual changes, CP, palipit, dizziness, syncope, dyspnea, fatigue, edema, etc...  ~  4/14:  on ASA81, Tenoretic50/25, Lisin2.5, K20Bid; BP= 118/78 & similar at home; she denies CP, palpit, dizzy, SOB, edema...  VENOUS INSUFFICIENCY (ICD-459.81) - she  tries to follow a sodium restricted diet... she has VI and chr ven insuffic changes/ chr edema... we discussed 62 gm Na+ restrictions and TED hose...  HYPERLIPIDEMIA (ICD-272.4) - on SIMVASTATIN 20mg65m NIACIN 500mg 30m& CoQ10 +diet efforts... ~  labs 9/08 on Simva20 showed TChol 148, TG 118, HDL 31, LDL 94 ~  labs at [facility] off Simva showed TChol 234, TG 151, HDL 34, LDL 170... rec> restart ZOCOR. ~  [facility] TChol 157, TG 106, HDL 40, LDL 96 ~  labs 3/11 at [facility] on Simva20 showed TChol 151, TG 116, HDL 38, LDL 90 ~  Labs 3/13  at [facility] on Simva20+Niacin500Bid showed TChol 146, TG 95, HDL 41, LDL 86  ~  Labs 4/14 [facility] on simva20+Niacin500Bid showed TChol 160, TG 141, HDL39, LDL 93  Hx IMPAIRED FASTING GLUCOSE >> +FamHx DM and borderline labs in past... ~  labs 9/08 (wt= 206#) showed BS= 114, A1c= 6.2 on diet alone... ~  labs 12/09 at [facility] showed BS= 107 ~  labs 3/11 at [facility] showed BS= 91, A1c= 5.8 ~  Labs 3/13 showed BS= 54, A1c= 5.9 ~  Labs 4/14 showed BS=100 & A1c wasn't done  OBESITY (ICD-278.00) - weight stable at about 200#... needs better diet and exercise program and we discussed this today... ~  weight 3/11 = 204# ~  weight 8/11 = 202# ~  weight 2/12 = 190# ~  Weight 3/13 = 194# ~  Weight 4/14 = 194#  GASTROESOPHAGEAL REFLUX DISEASE (ICD-530.81) - on OMEPRAZOLE Prn (she stopped when diet made her better)... last EGD was 3/06 showing HH and gastritis...   COLONIC POLYPS (ICD-211.3) ~  colonoscopy 10/04 by DrStark showed several 39m polyps (bx=minute frag of colonic mucosa only)... ~  colonoscopy 6/09 showed mult 3-465mpolyps= hyperplastic on bx, +melanosis coli... f/u 5y79yr DEGENERATIVE JOINT DISEASE (ICD-715.90) - prev on Etodolac but stopped since it upset her stomach... ~  she sees a chiropractor monthly, and massage therapist every 2-3 weeks (helps better than anything). ~  she had BMD done 8/13 by DrGottsegen w/ results in  Epic=> TScore -1.9 [facility] rec Calcium, MVI, VitD, wt bearing exercise & repeat 83yr20yr  [facility] showed Vit D level = 42 & rec to continue OTC Vit D supplement...  FASCIITIS, PLANTAR (ICD-728.71) - she had "sonic wave" therapy from podiatry which helped somewhat...  ANXIETY (ICD-300.00)  Health Maintenance: ~  GI:  followed by DrStark w/ f/u colon planned 6/14... ~  GYN:  followed by DrGottsegen & seen 6/13, & he follows her BMDs too; [facility] w/ cryosurg >25 yrs ago & no prob since... ~  Immunizations:  she used to get the yearly Flu vaccine but she stopped when she found out she was allergic to eggs on the internet rast testing... TDAP given 2012.   Past Surgical History  Procedure Laterality Date  . Oophorectomy  2000    RSO   . Tubal ligation    . Knee surgery  2008    arthroscopic  . Mouth surgery    . Nose surgery      deviated septum  . Back surgery    . Gynecologic cryosurgery      CIN  . Hysteroscopy w/d&c  11/2010    endometrial polyp  . Hysteroscopy  8.28.12    Outpatient Encounter Prescriptions as of 08/06/2012  Medication Sig Dispense Refill  . aspirin 81 MG tablet Take 81 mg by mouth daily.        . atMarland Kitchennolol-chlorthalidone (TENORETIC) 50-25 MG per tablet Take 1 tablet by mouth daily.  90 tablet  3  . Calcium Carbonate-Vitamin D (CALCIUM + D PO) Take by mouth daily.        . cholecalciferol (VITAMIN D) 1000 UNITS tablet Take 1,000 Units by mouth daily.        . liMarland Kitcheninopril (PRINIVIL,ZESTRIL) 2.5 MG tablet Take 1 tablet (2.5 mg total) by mouth daily.  90 tablet  3  . methocarbamol (ROBAXIN) 500 MG tablet Take 500 mg by mouth as needed.      . Multiple  Vitamin (MULTIVITAMIN) tablet Take 1 tablet by mouth daily.        . potassium chloride SA (KLOR-CON M20) 20 MEQ tablet Take 1 tablet (20 mEq total) by mouth 2 (two) times daily.  180 tablet  3  . simvastatin (ZOCOR) 20 MG tablet Take 1 tablet (20 mg total) by mouth at  bedtime.  90 tablet  3  . doxycycline (VIBRA-TABS) 100 MG tablet Take 1 tablet (100 mg total) by mouth 2 (two) times daily.  28 tablet  0   No facility-administered encounter medications on file as of 08/06/2012.    Allergies  Allergen Reactions  . Etodolac     REACTION: intolerant  causes stomach cramps  . Omeprazole     REACTION: intolerant causes stomach cramps  . Other Diarrhea    Eggs, pineapple    Current Medications, Allergies, Past Medical History, Past Surgical History, Family History, and Social History were reviewed in [employer] record.     Review of Systems Constitutional:   No  weight loss, night sweats,  + fatigue, or  lassitude.  HEENT:   No headaches,  Difficulty swallowing,  Tooth/dental problems, or  Sore throat,                No sneezing, itching, ear ache, nasal congestion, post nasal drip,   CV:  No chest pain,  Orthopnea, PND, swelling in lower extremities, anasarca, dizziness, palpitations, syncope.   GI  No heartburn, indigestion, abdominal pain, nausea, vomiting, diarrhea, change in bowel habits, loss of appetite, bloody stools.   Resp: No shortness of breath with exertion or at rest.  No excess mucus, no productive cough,  No non-productive cough,  No coughing up of blood.  No change in color of mucus.  No wheezing.  No chest wall deformity  Skin: + rash -see HPI   GU: no dysuria, change in color of urine, no urgency or frequency.  No flank pain, no hematuria   MS:  No joint pain or swelling.  No decreased range of motion.  No back pain.  Psych:  No change in mood or affect. No depression or anxiety.  No memory loss.              Objective:   Physical Exam     WD, Overweight, 62 y/o WF in NAD... GENERAL:  Alert & oriented; pleasant & cooperative... HEENT:  [place]/AT, EOM-wnl, PERRLA, EACs-clear, TMs-wnl, NOSE-clear, THROAT-clear & wnl. NECK:  Supple w/ fairROM; no JVD; normal carotid impulses w/o bruits; no thyromegaly  or nodules palpated; no lymphadenopathy. CHEST:   Clear without wheezing, rales, rhonchi heard... HEART:  Regular Rhythm; without murmurs/ rubs/ or gallops detected... ABDOMEN:  Obese, soft & nontender; normal bowel sounds; no organomegaly or masses detected., no groin adenopathy  EXT: without deformities, mild arthritic changes; no varicose veins/ +venous insuffic & chr changes/ 1+ edema. NEURO:  CN's intact; no focal neuro deficits... DERM:  Along the bottom of right buttock patch of redness with raised central papule (appears smaller than the picture previously taken by pt 1 week ago) . No central clearing   Along left inner buttock small 0.50 cm skin ulcer/excoriation  with pink base, no necrotic tissues noted.    Pictures viewed on pt IPAD showed oval red lesion surrounding site of previous tick bite ? Erythema migranis  (no central clearing)   RADIOLOGY DATA:  Reviewed in the EPIC EMR & discussed w/ the patient...  LABORATORY DATA:  Reviewed in the  EPIC EMR & discussed w/ the patient...   Assessment:

## 2012-08-06 NOTE — Assessment & Plan Note (Signed)
Due to place of this lesion will set pt up for wound care referral to see if this will aid in healing.   follow up in 2 weeks and As needed

## 2012-08-06 NOTE — Assessment & Plan Note (Signed)
Tick Bite on 07/18/12 >rash and viral symtpoms began ~4/17> ? Lymes exposure .  Will send labs including cbc, LFT , esr and lyme titer.  Will place pt on full course of antibitoic x 14 d w/ Doxy.  Have her return in 2 weeks.

## 2012-08-06 NOTE — Patient Instructions (Addendum)
Doxycycline 1100m Twice daily  For 14 days -take with food, use caution in sunlight as you are more likely to burn on this antibiotic.  Eat yogurt and take probiotic (Align ) while on antibiotic  Cool compresses to previous tick bite site.  I will call with lab results.  Call if area worsens or symtoms do not improve or worsen.  We are referring you to home health wound care referral for skin ulcer on left buttock .  Follow up 2 weeks and As needed

## 2012-08-09 LAB — B. BURGDORFI ANTIBODIES BY WB: B burgdorferi IgM Abs (IB): NEGATIVE

## 2012-08-10 NOTE — Progress Notes (Signed)
Quick Note:  Called spoke with patient, advised of lab results / recs as stated by TP. Pt verbalized her understanding and denied any questions. ______

## 2012-08-21 ENCOUNTER — Ambulatory Visit (INDEPENDENT_AMBULATORY_CARE_PROVIDER_SITE_OTHER): Payer: No Typology Code available for payment source | Admitting: Adult Health

## 2012-08-21 ENCOUNTER — Encounter: Payer: Self-pay | Admitting: Adult Health

## 2012-08-21 VITALS — BP 102/64 | HR 79 | Temp 97.6°F | Ht 63.0 in | Wt 193.2 lb

## 2012-08-21 DIAGNOSIS — L98499 Non-pressure chronic ulcer of skin of other sites with unspecified severity: Secondary | ICD-10-CM

## 2012-08-21 DIAGNOSIS — L98491 Non-pressure chronic ulcer of skin of other sites limited to breakdown of skin: Secondary | ICD-10-CM

## 2012-08-21 DIAGNOSIS — T148 Other injury of unspecified body region: Secondary | ICD-10-CM

## 2012-08-21 DIAGNOSIS — W57XXXA Bitten or stung by nonvenomous insect and other nonvenomous arthropods, initial encounter: Secondary | ICD-10-CM

## 2012-08-21 NOTE — Patient Instructions (Addendum)
Continue w/ wound dressing until completely healed.  Please contact office for sooner follow up if symptoms do not improve or worsen or seek emergency care

## 2012-08-23 NOTE — Progress Notes (Signed)
Subjective:     Patient ID: [name], female   DOB: [birth year], 62 y.o.   MRN: [id number]  HPI 62 y/o WF   ~  June 09, 2010:  6 mo ROV- doing better on her elimination diet, elim the foods she was sensitive to on the RAST testing & she notes that when she eats something she isn't supposed to-then she gets aching joints in addition to "a burning oozing diarrhea"... weight is down 12# to 190# & she is encouraged to keep up the good work... BP controlled on meds;  she is stable on the Simva20 but labs have to go to [facility] & are "pending";  with her wt reduction we anticipate the BP, Chol & BS will all be better & easier to manage...  ~  June 13, 2011:  Yearly ROV & CPX> [employer] insurance requiores that labs go to [facility]...    AbnCXR> Hx sm RUL nodule, likely granuloma w/o growth serially; CXR 3/13 w/o nodule seen; she remains asymptomatic w/o cough, sputum, hemoptysis, SOB, etc...    HBP> on ASA81, Tenoretic50/12.5, Lisin2.5, K20Bid; BP= 110/74 & similar at home; she denies CP, palpit, dizzy, SOB, edema; labs show norm electrolytes & renal> K=5.2    Ven Insuffic> on low sodium diet, elevation & support hose as needed...    Hyperlipidemia> on Simva20, Niacin500Bid, CoQ10, & diet; FLP showed TChol 146, TG 95, HDL 41, LDL 86    Hx Impaired Fasting Glucose> Hx borderline FBS in the past, diet Rx; labs 3/13 showed BS=543 and A1c= 5.9; not on meds just diet Rx & trying to lose wt.    Overweight> weight = 194# today; she has fluctuated 190-204#...    GI> GERD, Polyps> she uses OTC Prilosec as needed & her last colonoscopy 6/09 showed several hyperpl polyps; f/u planned 22yr.    DJD, Hx plantar fasciitis> on Vicodin prn + calcium, MVI, VitD...    Anxiety> she does not want anxiolytic therapy... CXR 3/13 showed normal heart size, clear lungs, NAD... LABS 3/13 done at [facility]:  FElliott at goals;  Chems- wnl;  CBC- wnl;  A1c=5.9;  VitD=41;  UA=clear   ~  July 16, 2012:  Yearly RShorterhas had a  good yr overall "Just getting older" & notes dental problems & arthritis;  She had a recent URI & we called in Levaquin- now resolved;  She hasn't really been on a diet other than to watch what she eats "due to my allergies";  She is due for a f/u colonoscopy this yr...  We reviewed the following medical problems during today's office visit >>      AbnCXR> Hx sm RUL nodule, likely granuloma w/o growth serially; CXR 4/14 w/o nodule seen; she remains asymptomatic w/o cough, sputum, hemoptysis, SOB, etc...    HBP> on ASA81, Tenoretic50/25, Lisin2.5, K20Bid; BP= 118/78 & similar at home; she denies CP, palpit, dizzy, SOB, edema...     Ven Insuffic> on low sodium diet (she has lots of room to improve), elevation & support hose as directed...    Hyperlipidemia> on Simva20, OTC Niacin500Bid, CoQ10, & diet; FLP shows TChol 160, TG 141, HDL39, LDL 93    Hx Impaired Fasting Glucose> Hx borderline FBS in the past, diet Rx; labs 4/14 showed BS=100 and A1c=not done ; we discussed diet, exercise, wt reduction...    Overweight> weight = 194# today (no change in 183yr she has fluctuated 190-204# in recent yrs...    GI> GERD, Polyps> she uses  OTC Prilosec as needed (depends on her diet) & her last colonoscopy 6/09 showed several hyperplastic polyps w/ f/u planned 69yr.    DJD, Hx plantar fasciitis> on OTC analgesics as needed; she still sees her chiro & massage therapist regularly    Osteopenia> she had BMD done 8/13 by DrGottsegen w/ results in Epic=> TScore -1.9 in right FemNeck; he rec Calcium, MVI, VitD, wt bearing exercise & repeat 238yr    Anxiety> she does not want anxiolytic therapy...  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 CXR 4/14 showed heart wnl in size, lungs clear, nodule not seen, DJD in spine, NAD... LABS 4/14:  [facility]   08/06/12  Acute OV  Complains of tick bite x2 weeks,  finished doxycycline with no relief. Noticed she had a tick along her right lower buttock /upper thigh ~07/18/12 . Tick was attached. Not engorged. ?unknown time present. Walks dogs frequently outside.  Around a week later noticed she had an irritated spot along the area, took a picture of the area . Red circular area , pruritic at times. Placed neosporin and cortisporin on area.  Around 4/17 developed nausea,vomitting, body aches.chills and feverish w/  Some diarrhea. Felt bad all over. She called on call MD on 4/19, explained above symptoms. Called in Doxycycline x 7 d. Feeling some better with resolution of n/v/d and fever. Still feels some weakness/low energy.  Noticed along left inner buttock she has an irriatated place on skin. Has been using antiinfecitve cleanser -not getting better over last several days.  No rash, joint swelling, extremity weakness, or muscle twitching. No fevers or bloody stools.  No known sick contacts. No recent travel .  >Doxycycline x 14 d  HH consult for wound   08/21/12 Follow up  Pt returns for 2 week follow up  2 week follow up - reports tick bite and ulcer are healing.  finished the doxycycline yesterday. Pt tolerated abx well.  Site of tick bite is almost completely gone. No rash , joint pain, fever .  HH came out x 2 , began applying colloidal dressing , area is almost healed.  No fever, drainage or pain.  PROBLEM LIST:    ABNORMAL CHEST XRAY (ICD-793.1) - CXR 3/09 w/ 50m5mUL nodule seen... serial films showed no growth & it was difficult to see at all on 12/09 CXR... nonsmoker, exposed to husb second hand smoke. ~  f/u CXR 12/09 showed nodule ?obscured by ribs, NAD... Marland Kitchen  CXR 3/11 showed clear lungs, no nodule seen... ~  CXR 2/12 showed WNL, NAD... Marland Kitchen  CXR 3/13 showed normal heart size, clear lungs, NAD... Marland Kitchen  CXR 4/14 showed heart wnl in size, lungs clear, nodule not seen, DJD in spine, NAD...  HYPERTENSION (ICD-401.9) - controlled on TENORETIC 50/25 daily,  ZESTRIL 2.5mg52m K20Bid...  ~  3/13:  BP's at home are all in the 120/80 range and BP= 110/74 today> taking meds regularly and tol well; denies HA, visual changes, CP, palipit, dizziness, syncope, dyspnea, fatigue, edema, etc...  ~  4/14:  on ASA81, Tenoretic50/25, Lisin2.5, K20Bid; BP= 118/78 & similar at home; she denies CP, palpit, dizzy, SOB, edema...  VENOUS INSUFFICIENCY (ICD-459.81) - she tries to follow a sodium restricted diet... she has VI and chr ven insuffic changes/ chr edema... we discussed 2 gm Na+ restrictions and TED hose...  HYPERLIPIDEMIA (ICD-272.4) - on SIMVASTATIN  50m/d + NIACIN 5024mBid & CoQ10 +diet efforts... ~  labs 9/08 on Simva20 showed TChol 148, TG 118, HDL 31, LDL 94 ~  [facility] showed TChol 234, TG 151, HDL 34, LDL 170... rec> restart ZOCOR. ~  [facility] TChol 157, TG 106, HDL 40, LDL 96 ~  [facility] TChol 151, TG 116, HDL 38, LDL 90 ~  Labs 3/13 at [facility] on Simva20+Niacin500Bid showed TChol 146, TG 95, HDL 41, LDL 86  ~  [facility] 4/14 [facility] on simva20+Niacin500Bid showed TChol 160, TG 141, HDL39, LDL 93  Hx IMPAIRED FASTING GLUCOSE >> +FamHx DM and borderline labs in past... ~  labs 9/08 (wt= 206#) showed BS= 114, A1c= 6.2 on diet alone... ~  [facility]  labs 3/11 at [facility] showed BS= 91, A1c= 5.8 ~  Labs 3/13 showed BS= 54, A1c= 5.9 ~  Labs 4/14 showed BS=100 & A1c wasn't done  OBESITY (ICD-278.00) - weight stable at about 200#... needs better diet and exercise program and we discussed this today... ~  weight 3/11 = 204# ~  weight 8/11 = 202# ~  weight 2/12 = 190# ~  Weight 3/13 = 194# ~  Weight 4/14 = 194#  GASTROESOPHAGEAL REFLUX DISEASE (ICD-530.81) - on OMEPRAZOLE Prn (she stopped when diet made her better)... last EGD was 3/06 showing HH and gastritis...   COLONIC POLYPS (ICD-211.3) ~  colonoscopy 10/04 by DrStark showed several 75m3molyps  (bx=minute frag of colonic mucosa only)... ~  colonoscopy 6/09 showed mult 3-4mm15mlyps= hyperplastic on bx, +melanosis coli... f/u 57yrs51yrEGENERATIVE JOINT DISEASE (ICD-715.90) - prev on Etodolac but stopped since it upset her stomach... ~  she sees a chiropractor monthly, and massage therapist every 2-3 weeks (helps better than anything). ~  she had BMD done 8/13 by DrGottsegen w/ results in Epic=> TScore -1.9 [facility] right [facility] Increc Calcium, MVI, VitD, wt bearing exercise & [facility] 67yrs[facility] & rec to continue OTC Vit D supplement...  FASCIITIS, PLANTAR (ICD-728.71) - she had "sonic wave" therapy from podiatry which helped somewhat...  ANXIETY (ICD-300.00)  Health Maintenance: ~  GI:  followed by DrStark w/ f/u colon planned 6/14... ~  GYN:  followed by DrGottsegen & seen 6/13, & he follows her BMDs too; [facility] & no prob since... ~  Immunizations:  she used to get the yearly Flu vaccine but she stopped when she found out she was allergic to eggs on the internet rast testing... TDAP given 2012.   Past Surgical History  Procedure Laterality Date  . Oophorectomy  2000    RSO   . Tubal ligation    . Knee surgery  2008    arthroscopic  . Mouth surgery    . Nose surgery      deviated septum  . Back surgery    . Gynecologic cryosurgery      [facility]IN  . Hysteroscopy w/d&c  11/2010    endometrial polyp  . Hysteroscopy  8.28.12    Outpatient Encounter Prescriptions as of 08/21/2012  Medication Sig Dispense Refill  . aspirin 81 MG tablet Take 81 mg by mouth daily.        . atenMarland Kitchenlol-chlorthalidone (TENORETIC) 50-25 MG per tablet Take 1 tablet by mouth daily.  90 tablet  3  . Calcium Carbonate-Vitamin D (CALCIUM + D PO) Take by mouth daily.        . cholecalciferol (VITAMIN D) 1000 UNITS tablet Take 1,000 Units by mouth daily.        Marland Kitchen lisinopril (PRINIVIL,ZESTRIL) 2.5 MG tablet Take 1 tablet (2.5 mg  total) by mouth daily.  90 tablet  3  . methocarbamol (ROBAXIN) 500 MG tablet Take 500 mg by mouth as needed.      . Multiple Vitamin (MULTIVITAMIN) tablet Take 1 tablet by mouth daily.        . ondansetron (ZOFRAN) 4 MG tablet Take 1 tablet by mouth every 4 (four) hours as needed.      . potassium chloride SA (KLOR-CON M20) 20 MEQ tablet Take 1 tablet (20 mEq total) by mouth 2 (two) times daily.  180 tablet  3  . simvastatin (ZOCOR) 20 MG tablet Take 1 tablet (20 mg total) by mouth at bedtime.  90 tablet  3  . doxycycline (VIBRA-TABS) 100 MG tablet Take 1 tablet (100 mg total) by mouth 2 (two) times daily.  28 tablet  0   No facility-administered encounter medications on file as of 08/21/2012.    Allergies  Allergen Reactions  . Etodolac     REACTION: intolerant  causes stomach cramps  . Omeprazole     REACTION: intolerant causes stomach cramps  . Other Diarrhea    Eggs, pineapple    Current Medications, Allergies, Past Medical History, Past Surgical History, Family History, and Social History were reviewed in [employer] record.     Review of Systems Constitutional:   No  weight loss, night sweats,  fatigue, or  lassitude.  HEENT:   No headaches,  Difficulty swallowing,  Tooth/dental problems, or  Sore throat,                No sneezing, itching, ear ache, nasal congestion, post nasal drip,   CV:  No chest pain,  Orthopnea, PND, swelling in lower extremities, anasarca, dizziness, palpitations, syncope.   GI  No heartburn, indigestion, abdominal pain, nausea, vomiting, diarrhea, change in bowel habits, loss of appetite, bloody stools.   Resp: No shortness of breath with exertion or at rest.  No excess mucus, no productive cough,  No non-productive cough,  No coughing up of blood.  No change in color of mucus.  No wheezing.  No chest wall deformity  Skin: no rash -see HPI   GU: no dysuria, change in color of urine, no urgency or frequency.  No flank pain,  no hematuria   MS:  No joint pain or swelling.  No decreased range of motion.  No back pain.  Psych:  No change in mood or affect. No depression or anxiety.  No memory loss.              Objective:   Physical Exam     WD, Overweight, 62 y/o WF in NAD... GENERAL:  Alert & oriented; pleasant & cooperative... HEENT:  [place]/AT, EOM-wnl, PERRLA, EACs-clear, TMs-wnl, NOSE-clear, THROAT-clear & wnl. NECK:  Supple w/ fairROM; no JVD; normal carotid impulses w/o bruits; no thyromegaly or nodules palpated; no lymphadenopathy. CHEST:   Clear without wheezing, rales, rhonchi heard... HEART:  Regular Rhythm; without murmurs/ rubs/ or gallops detected... ABDOMEN:  Obese, soft & nontender; normal bowel sounds; no organomegaly or masses detected., no groin adenopathy  EXT: without deformities, mild arthritic changes; no varicose veins/ +venous insuffic & chr changes/ 1+ edema. NEURO:  no focal neuro deficits... DERM:  Along the bottom of right buttock patch of redness with raised central papule (appears smaller than the picture previously taken by pt 1 week ago) . No central clearing   Along left inner buttock small skin ulcer/excoriation improved w/ decreased size and no break in skin noted. no necrotic tissues noted.   Previous tick bite site decreased with no surrounding redness or central clearing.   Assessment:

## 2012-08-23 NOTE — Assessment & Plan Note (Signed)
Small inner left buttock ulcer -improved colloidal dressing Appreciate HH wound care input  Area is nearly healed completely Cont w/ dressing until completely healed Advised or perineal care  follow up as planned and As needed

## 2012-08-23 NOTE — Assessment & Plan Note (Signed)
Tick bite w/ localized reaction- improving  Lyme titer return w/ neg results.  Pt completed empiric course of Doxycycline - No apparent complication evident  follow up as planned and As needed

## 2012-09-06 ENCOUNTER — Encounter: Payer: Self-pay | Admitting: Gastroenterology

## 2012-09-10 ENCOUNTER — Other Ambulatory Visit: Payer: Self-pay | Admitting: Gynecology

## 2012-09-10 ENCOUNTER — Encounter: Payer: Self-pay | Admitting: Gynecology

## 2012-09-10 DIAGNOSIS — D649 Anemia, unspecified: Secondary | ICD-10-CM

## 2012-09-18 ENCOUNTER — Telehealth: Payer: Self-pay | Admitting: Pulmonary Disease

## 2012-09-18 NOTE — Telephone Encounter (Signed)
Pt calling again in ref to previous msg.[name] ° ° °

## 2012-09-18 NOTE — Telephone Encounter (Signed)
Called and spoke with pt and she is aware that copy of her labs will be mailed to her.  Pt voiced her understanding of this and nothing further is needed.

## 2012-09-21 ENCOUNTER — Encounter: Payer: Self-pay | Admitting: Pulmonary Disease

## 2012-10-02 ENCOUNTER — Ambulatory Visit (INDEPENDENT_AMBULATORY_CARE_PROVIDER_SITE_OTHER): Payer: No Typology Code available for payment source | Admitting: Gynecology

## 2012-10-02 ENCOUNTER — Encounter: Payer: Self-pay | Admitting: Gynecology

## 2012-10-02 VITALS — BP 124/78 | Ht 62.0 in | Wt 178.0 lb

## 2012-10-02 DIAGNOSIS — N952 Postmenopausal atrophic vaginitis: Secondary | ICD-10-CM

## 2012-10-02 DIAGNOSIS — M899 Disorder of bone, unspecified: Secondary | ICD-10-CM

## 2012-10-02 DIAGNOSIS — N8111 Cystocele, midline: Secondary | ICD-10-CM

## 2012-10-02 DIAGNOSIS — M8580 Other specified disorders of bone density and structure, unspecified site: Secondary | ICD-10-CM

## 2012-10-02 DIAGNOSIS — Z01419 Encounter for gynecological examination (general) (routine) without abnormal findings: Secondary | ICD-10-CM

## 2012-10-02 NOTE — Progress Notes (Addendum)
[name] 27-Nov-1950 [id number]        62 y.o.  G2P2002 for annual exam.  Doing well without complaints.  Past medical history,surgical history, medications, allergies, family history and social history were all reviewed and documented in the EPIC chart.  ROS:  Performed and pertinent positives and negatives are included in the history, assessment and plan .  Exam: Kim assistant Filed Vitals:   10/02/12 1135  BP: 124/78  Height: 5' 2"  (1.575 m)  Weight: 178 lb (80.74 kg)   General appearance  Normal Skin grossly normal Head/Neck normal with no cervical or supraclavicular adenopathy thyroid normal Lungs  clear Cardiac RR, without RMG Abdominal  soft, nontender, without masses, organomegaly or hernia Breasts  examined lying and sitting without masses, retractions, discharge or axillary adenopathy. Pelvic  Ext/BUS/vagina  normal with atrophic changes  Cervix  normal with atrophic changes  Uterus  anteverted, normal size, shape and contour, midline and mobile nontender   Adnexa  Without masses or tenderness    Anus and perineum  normal   Rectovaginal  normal sphincter tone without palpated masses or tenderness.    Assessment/Plan:  61 y.o. [id number] female for annual exam.   1. Postmenopausal/atrophic changes. Patient asymptomatic without significant hot flashes night sweats vaginal dryness or dyspareunia. Will continue to monitor. Patient knows to report any bleeding. 2. Cystocele, first degree. Patient asymptomatic without symptoms of pressure or incontinence. Will continue to monitor. 3. Osteopenia. DEXA 11/2011. T score -1.9 FRAX 15%/1.8%. Plan repeat next year at two-year interval. Increase calcium and vitamin D reviewed. 4. Pap smear 2012. No Pap smear done today. History of cryosurgery 25 -30 years ago. Normal Pap smears since then. Plan repeat Pap smear next year at 3 year interval. 5. Mammography 08/2012. Continue with annual mammography. SBE monthly  reviewed. 6. Colonoscopy due now she knows to schedule this. 7. Health maintenance. No blood work done as it is all done through her primary physician's office. Followup in one year, sooner as needed.    Anastasio Auerbach MD, 11:59 AM 10/02/2012

## 2012-10-02 NOTE — Patient Instructions (Signed)
Followup in one year for annual exam, sooner if any issues 

## 2012-10-03 LAB — URINALYSIS W MICROSCOPIC + REFLEX CULTURE
Casts: NONE SEEN
Glucose, UA: NEGATIVE mg/dL
Leukocytes, UA: NEGATIVE
Nitrite: NEGATIVE
pH: 7 (ref 5.0–8.0)

## 2012-10-16 ENCOUNTER — Encounter: Payer: Self-pay | Admitting: Gastroenterology

## 2012-11-16 ENCOUNTER — Encounter: Payer: Self-pay | Admitting: Gastroenterology

## 2012-11-16 ENCOUNTER — Telehealth: Payer: Self-pay | Admitting: *Deleted

## 2012-11-16 ENCOUNTER — Ambulatory Visit (AMBULATORY_SURGERY_CENTER): Payer: No Typology Code available for payment source

## 2012-11-16 VITALS — Ht 63.0 in | Wt 185.6 lb

## 2012-11-16 DIAGNOSIS — Z86010 Personal history of colonic polyps: Secondary | ICD-10-CM

## 2012-11-16 MED ORDER — MOVIPREP 100 G PO SOLR: 1.0000 | Freq: Once | ORAL | Status: DC

## 2012-11-16 NOTE — Progress Notes (Signed)
Egg causes nausea, vomiting, abd cramping, diarrhea, joint pain.  Gets nausea from anesthesia.

## 2012-11-16 NOTE — Telephone Encounter (Signed)
Pt had pre-visit today. Noted egg allergy, reaction is nausea, vomiting, abd cramping, and joint pain. Pt is scheduled for colonoscopy with Dr. Fuller Plan on 11/30/12 at 10:30am. Is pt ok for propofol?-adm

## 2012-11-26 NOTE — Telephone Encounter (Signed)
Pt is aware that Osvaldo Angst, CRNA and Dr. Fuller Plan agree that pt should have Versed and Fentanyl instead of Propofol.

## 2012-11-30 ENCOUNTER — Encounter: Payer: Self-pay | Admitting: Gastroenterology

## 2012-11-30 ENCOUNTER — Ambulatory Visit (AMBULATORY_SURGERY_CENTER): Payer: No Typology Code available for payment source | Admitting: Gastroenterology

## 2012-11-30 VITALS — BP 122/75 | HR 78 | Temp 98.3°F | Resp 14 | Ht 63.0 in | Wt 185.0 lb

## 2012-11-30 DIAGNOSIS — Z86010 Personal history of colonic polyps: Secondary | ICD-10-CM

## 2012-11-30 MED ORDER — SODIUM CHLORIDE 0.9 % IV SOLN: 500.0000 mL | INTRAVENOUS | Status: DC

## 2012-11-30 NOTE — Progress Notes (Signed)
Patient did not experience any of the following events: a burn prior to discharge; a fall within the facility; wrong site/side/patient/procedure/implant event; or a hospital transfer or hospital admission upon discharge from the facility. (G8907) Patient did not have preoperative order for IV antibiotic SSI prophylaxis. (G8918)  

## 2012-11-30 NOTE — Progress Notes (Signed)
Procedure ends, to recovery, report given and VSS. 

## 2012-11-30 NOTE — Patient Instructions (Signed)
YOU HAD AN ENDOSCOPIC PROCEDURE TODAY AT THE [facility]: Refer to the procedure report that was given to you for any specific questions about what was found during the examination.  If the procedure report does not answer your questions, please call your gastroenterologist to clarify.  If you requested that your care partner not be given the details of your procedure findings, then the procedure report has been included in a sealed envelope for you to review at your convenience later.  YOU SHOULD EXPECT: Some feelings of bloating in the abdomen. Passage of more gas than usual.  Walking can help get rid of the air that was put into your GI tract during the procedure and reduce the bloating. If you had a lower endoscopy (such as a colonoscopy or flexible sigmoidoscopy) you may notice spotting of blood in your stool or on the toilet paper. If you underwent a bowel prep for your procedure, then you may not have a normal bowel movement for a few days.  DIET: Your first meal following the procedure should be a light meal and then it is ok to progress to your normal diet.  A half-sandwich or bowl of soup is an example of a good first meal.  Heavy or fried foods are harder to digest and may make you feel nauseous or bloated.  Likewise meals heavy in dairy and vegetables can cause extra gas to form and this can also increase the bloating.  Drink plenty of fluids but you should avoid alcoholic beverages for 24 hours.  ACTIVITY: Your care partner should take you home directly after the procedure.  You should plan to take it easy, moving slowly for the rest of the day.  You can resume normal activity the day after the procedure however you should NOT DRIVE or use heavy machinery for 24 hours (because of the sedation medicines used during the test).    SYMPTOMS TO REPORT IMMEDIATELY: A gastroenterologist can be reached at any hour.  During normal business hours, 8:30 AM to 5:00 PM Monday through Friday,  call [phone].  After hours and on weekends, please call the GI answering service at [phone] who will take a message and have the physician on call contact you.   Following lower endoscopy (colonoscopy or flexible sigmoidoscopy):  Excessive amounts of blood in the stool  Significant tenderness or worsening of abdominal pains  Swelling of the abdomen that is new, acute  Fever of 100F or higher    FOLLOW UP: If any biopsies were taken you will be contacted by phone or by letter within the next 1-3 weeks.  Call your gastroenterologist if you have not heard about the biopsies in 3 weeks.  Our staff will call the home number listed on your records the next business day following your procedure to check on you and address any questions or concerns that you may have at that time regarding the information given to you following your procedure. This is a courtesy call and so if there is no answer at the home number and we have not heard from you through the emergency physician on call, we will assume that you have returned to your regular daily activities without incident.  SIGNATURES/CONFIDENTIALITY: You and/or your care partner have signed paperwork which will be entered into your electronic medical record.  These signatures attest to the fact that that the information above on your After Visit Summary has been reviewed and is understood.  Full responsibility of the confidentiality   of this discharge information lies with you and/or your care-partner.     

## 2012-11-30 NOTE — Op Note (Addendum)
[place]  [address] [place], [id number]   COLONOSCOPY PROCEDURE REPORT  PATIENT: [name], [name]  MR#: [id number] BIRTHDATE: 06-22-1950 , 81  yrs. old GENDER: Female ENDOSCOPIST: Ladene Artist, MD, [facility] PROCEDURE DATE:  11/30/2012 PROCEDURE:   Colonoscopy, surveillance First Screening Colonoscopy - Avg.  risk and is 50 yrs.  old or older - No.  Prior Negative Screening - Now for repeat screening. N/A  History of Adenoma - Now for follow-up colonoscopy & has been > or = to 3 yrs.  Yes hx of adenoma.  Has been 3 or more years since last colonoscopy.  Polyps Removed Today? No.  Recommend repeat exam, <10 yrs? Yes.  High risk (family or personal hx). ASA CLASS:   Class II INDICATIONS:Patient's personal history of adenomatous colon polyps.  MEDICATIONS: Fentanyl 100 mcg IV and Versed 7 mg IV per CRNA DESCRIPTION OF PROCEDURE:   After the risks benefits and alternatives of the procedure were thoroughly explained, informed consent was obtained.  A digital rectal exam revealed no abnormalities of the rectum.   The LB [id number]  endoscope was introduced through the anus and advanced to the cecum, which was identified by both the appendix and ileocecal valve. No adverse events experienced.   The quality of the prep was excellent, using MoviPrep  The instrument was then slowly withdrawn as the colon was fully examined.  COLON FINDINGS: Mild diverticulosis was noted in the transverse colon and sigmoid colon.   The colon was otherwise normal.  There was no diverticulosis, inflammation, polyps or cancers unless previously stated.  Retroflexed views revealed no abnormalities. The time to cecum=3 minutes 38 seconds.  Withdrawal time=8 minutes 36 seconds.  The scope was withdrawn and the procedure completed.  COMPLICATIONS: There were no complications.  ENDOSCOPIC IMPRESSION: 1.   Mild diverticulosis was noted in the transverse colon and sigmoid colon 2.    The colon was otherwise normal  RECOMMENDATIONS: 1.  High fiber diet with liberal fluid intake. 2.  Repeat Colonoscopy in 5 years.  eSigned:  Ladene Artist, MD, [facility] 11/30/2012 10:27 AM Revised: 11/30/2012 10:27 AM

## 2012-12-03 ENCOUNTER — Telehealth: Payer: Self-pay

## 2012-12-03 NOTE — Telephone Encounter (Signed)
  Follow up Call-  Call back number 11/30/2012  Post procedure Call Back phone  # [phone]  Permission to leave phone message Yes     Patient questions:  Do you have a fever, pain , or abdominal swelling? no Pain Score  0 *  Have you tolerated food without any problems? yes  Have you been able to return to your normal activities? yes  Do you have any questions about your discharge instructions: Diet   no Medications  no Follow up visit  no  Do you have questions or concerns about your Care? no  Actions: * If pain score is 4 or above: No action needed, pain <4.

## 2013-02-02 ENCOUNTER — Other Ambulatory Visit: Payer: Self-pay | Admitting: Pulmonary Disease

## 2013-02-14 ENCOUNTER — Other Ambulatory Visit: Payer: Self-pay

## 2013-06-19 ENCOUNTER — Other Ambulatory Visit: Payer: Self-pay | Admitting: Pulmonary Disease

## 2013-06-19 DIAGNOSIS — E785 Hyperlipidemia, unspecified: Secondary | ICD-10-CM

## 2013-06-19 DIAGNOSIS — I10 Essential (primary) hypertension: Secondary | ICD-10-CM

## 2013-06-24 ENCOUNTER — Ambulatory Visit (INDEPENDENT_AMBULATORY_CARE_PROVIDER_SITE_OTHER): Payer: No Typology Code available for payment source | Admitting: Adult Health

## 2013-06-24 ENCOUNTER — Encounter: Payer: Self-pay | Admitting: Adult Health

## 2013-06-24 VITALS — BP 124/72 | HR 78 | Temp 98.7°F | Ht 63.0 in | Wt 188.6 lb

## 2013-06-24 DIAGNOSIS — R0789 Other chest pain: Secondary | ICD-10-CM | POA: Insufficient documentation

## 2013-06-24 DIAGNOSIS — R079 Chest pain, unspecified: Secondary | ICD-10-CM

## 2013-06-24 DIAGNOSIS — R0781 Pleurodynia: Secondary | ICD-10-CM | POA: Insufficient documentation

## 2013-06-24 MED ORDER — HYDROCODONE-ACETAMINOPHEN 5-325 MG PO TABS: 1.0000 | ORAL_TABLET | Freq: Four times a day (QID) | ORAL | Status: DC | PRN

## 2013-06-24 NOTE — Patient Instructions (Addendum)
Alternate ice and heat to ribs As needed   May continue with massage/ultrasound as needed.  Can take Naproxen As needed  Pain .  May use Vicodin 1 every 6hrs as needed for severe pain.  Please contact office for sooner follow up if symptoms do not improve or worsen or seek emergency care   Dr. Lenna Gilford is retiring and we will need to set you up with a new Primary Care provider.  Please let us know if you would like [place] to assist with this referral.

## 2013-06-24 NOTE — Assessment & Plan Note (Signed)
Rib pain worse on Left s/p fall  Suspect contusion  Exam unrevealing.   Plan  Alternate ice and heat to ribs As needed   May continue with massage/ultrasound as needed.  Can take Naproxen As needed  Pain .  May use Vicodin 1 every 6hrs as needed for severe pain.  Please contact office for sooner follow up if symptoms do not improve or worsen or seek emergency care

## 2013-06-24 NOTE — Progress Notes (Signed)
Subjective:     Patient ID: [name], female   DOB: [birth year], 63 y.o.   MRN: [id number]  HPI 63 y/o WF    06/24/2013 Acute OV Complains of rib pain worse on left. Is better but not resolved.  [place] recent fall x3 weeks ago from the back of a pick-up truck onto concrete.  Was trying to get off truck from sitting position , fell onto chest/ribs very hard.  Had minimal bruising. Minimal pain w/ inspiration. Tender to touch.  Has been going to massage therapist w/ massage and ultrasound.  No chest pian , orthopnea, edema , hemoptysis, fever, abd pain or n/v.     PROBLEM LIST:    ABNORMAL CHEST XRAY (ICD-793.1) - CXR 3/09 w/ 7m RUL nodule seen... serial films showed no growth & it was difficult to see at all on 12/09 CXR... nonsmoker, exposed to husb second hand smoke. ~  f/u CXR 12/09 showed nodule ?obscured by ribs, NAD..Marland Kitchen ~  CXR 3/11 showed clear lungs, no nodule seen... ~  CXR 2/12 showed WNL, NAD..Marland Kitchen ~  CXR 3/13 showed normal heart size, clear lungs, NAD..Marland Kitchen ~  CXR 4/14 showed heart wnl in size, lungs clear, nodule not seen, DJD in spine, NAD...  HYPERTENSION (ICD-401.9) - controlled on TENORETIC 50/25 daily, ZESTRIL 2.531md, K20Bid...  ~  3/13:  BP's at home are all in the 120/80 range and BP= 110/74 today> taking meds regularly and tol well; denies HA, visual changes, CP, palipit, dizziness, syncope, dyspnea, fatigue, edema, etc...  ~  4/14:  on ASA81, Tenoretic50/25, Lisin2.5, K20Bid; BP= 118/78 & similar at home; she denies CP, palpit, dizzy, SOB, edema...  VENOUS INSUFFICIENCY (ICD-459.81) - she tries to follow a sodium restricted diet... she has VI and chr ven insuffic changes/ chr edema... we discussed 2 gm Na+ restrictions and TED hose...  HYPERLIPIDEMIA (ICD-272.4) - on SIMVASTATIN 2053m + NIACIN 500m90md & CoQ10 +diet efforts... ~  labs 9/08 on Simva20 showed TChol 148, TG 118, HDL 31, LDL 94 ~  [facility] showed TChol 234, TG 151, HDL 34, LDL  170... rec> restart ZOCOR. ~  [facility] T[facility], TG 106, HDL 40, LDL 96 ~  [facility] T[facility], TG 116, HDL 38, LDL 90 ~  Labs 3/13 at [facility] on Simva20+Niacin500Bid showed TChol 146, TG 95, HDL 41, LDL 86  ~  [facility] simva20+Niacin500Bid showed TChol 160, TG 141, HDL39, LDL 93  Hx IMPAIRED FASTING GLUCOSE >> +FamHx DM and borderline labs in past... ~  labs 9/08 (wt= 206#) showed BS= 114, A1c= 6.2 on diet alone... ~  [facility] ~  labs 3/11 at [facility] showed BS= 91, A1c= 5.8 ~  Labs 3/13 showed BS= 54, A1c= 5.9 ~  Labs 4/14 showed BS=100 & A1c wasn't done  OBESITY (ICD-278.00) - weight stable at about 200#... needs better diet and exercise program and we discussed this today... ~  weight 3/11 = 204# ~  weight 8/11 = 202# ~  weight 2/12 = 190# ~  Weight 3/13 = 194# ~  Weight 4/14 = 194#  GASTROESOPHAGEAL REFLUX DISEASE (ICD-530.81) - on OMEPRAZOLE Prn (she stopped when diet made her better)... last EGD was 3/06 showing HH and gastritis...   COLONIC POLYPS (ICD-211.3) ~  colonoscopy 10/04 by DrStark showed several 2mm 24myps (bx=minute frag of colonic mucosa only)... ~  colonoscopy  6/09 showed mult 3-4m polyps= hyperplastic on bx, +melanosis coli... f/u 523yr  DEGENERATIVE JOINT DISEASE (ICD-715.90) - prev on Etodolac but stopped since it upset her stomach... ~  she sees a chiropractor monthly, and massage therapist every 2-3 weeks (helps better than anything). ~  she had BMD done 8/13 by DrGottsegen w/ results in Epic=> TScore -1.9 [facility] right [facility], MVI, VitD, wt bearing exercise & repeat 2y71yr~  [facility] Vit D level = 42 & rec to continue OTC Vit D supplement...  FASCIITIS, PLANTAR (ICD-728.71) - she had "sonic wave" therapy from podiatry which helped somewhat...  ANXIETY (ICD-300.00)  Health Maintenance: ~  GI:  followed by DrStark w/ f/u colon planned  6/14... ~  GYN:  followed by DrGottsegen & seen 6/13, & he follows her BMDs too; [facility] >[facility] yrs [facility] & no prob since... ~  Immunizations:  she used to get the yearly Flu vaccine but she stopped when she found out she was allergic to eggs on the internet rast testing... TDAP given 2012.   Past Surgical History  Procedure Laterality Date  . Oophorectomy  2000    RSO   . Tubal ligation    . Knee surgery  2008    arthroscopic  . Mouth surgery    . Nose surgery      deviated septum  . Back surgery    . Gynecologic cryosurgery      CIN  . Hysteroscopy w/d&c  11/2010    endometrial polyp  . Hysteroscopy  8.28.12  . Colposcopy      Outpatient Encounter Prescriptions as of 06/24/2013  Medication Sig  . aspirin 81 MG tablet Take 81 mg by mouth daily.    . aMarland Kitchenenolol-chlorthalidone (TENORETIC) 50-25 MG per tablet Take 1 tablet by mouth daily.  . Calcium Carbonate-Vitamin D (CALCIUM + D PO) Take by mouth daily.    . cholecalciferol (VITAMIN D) 1000 UNITS tablet Take 1,000 Units by mouth daily.    . lMarland Kitchensinopril (PRINIVIL,ZESTRIL) 2.5 MG tablet Take 1 tablet (2.5 mg total) by mouth daily.  . methocarbamol (ROBAXIN) 500 MG tablet Take 500 mg by mouth as needed.  . Multiple Vitamin (MULTIVITAMIN) tablet Take 1 tablet by mouth daily.    . naproxen sodium (ANAPROX) 550 MG tablet   . potassium chloride SA (KLOR-CON M20) 20 MEQ tablet Take 1 tablet (20 mEq total) by mouth 2 (two) times daily.  . simvastatin (ZOCOR) 20 MG tablet TAKE 1 TABLET DAILY  . [DISCONTINUED] KLOR-CON M20 20 MEQ tablet TAKE 1 TABLET TWICE A DAY  . [DISCONTINUED] simvastatin (ZOCOR) 20 MG tablet Take 1 tablet (20 mg total) by mouth at bedtime.    Allergies  Allergen Reactions  . Etodolac     REACTION: intolerant  causes stomach cramps  . Omeprazole     REACTION: intolerant causes stomach cramps  . Other Diarrhea and Other (See Comments)    Eggs, pineapple, fish, joint pain     Current  Medications, Allergies, Past Medical History, Past Surgical History, Family History, and Social History were reviewed in [employer].     Review of Systems Constitutional:   No  weight loss, night sweats,  fatigue, or  lassitude.  HEENT:   No headaches,  Difficulty swallowing,  Tooth/dental problems, or  Sore throat,                No sneezing, itching, ear ache,  nasal congestion, post nasal drip,   CV:  No chest pain,  Orthopnea, PND, swelling in lower extremities, anasarca, dizziness, palpitations, syncope.   GI  No heartburn, indigestion, abdominal pain, nausea, vomiting, diarrhea, change in bowel habits, loss of appetite, bloody stools.   Resp: No shortness of breath with exertion or at rest.  No excess mucus, no productive cough,  No non-productive cough,  No coughing up of blood.  No change in color of mucus.  No wheezing.  No chest wall deformity  Skin: no rash -see HPI   GU: no dysuria, change in color of urine, no urgency or frequency.  No flank pain, no hematuria   MS:  No joint pain or swelling.  No decreased range of motion.  No back pain.  Psych:  No change in mood or affect. No depression or anxiety.  No memory loss.              Objective:   Physical Exam     WD, Overweight, 63 y/o WF in NAD... GENERAL:  Alert & oriented; pleasant & cooperative... HEENT:  [place]/AT, EACs-clear, TMs-wnl, NOSE-clear, THROAT-clear & wnl. NECK:  Supple w/ fairROM; no JVD; normal carotid impulses w/o bruits; no thyromegaly or nodules palpated; no lymphadenopathy. CHEST:   Clear without wheezing, rales, rhonchi heard... Mild tenderness along left lateral ribs, no deformity noted.  HEART:  Regular Rhythm; without murmurs/ rubs/ or gallops detected... ABDOMEN:  Obese, soft & nontender; normal bowel sounds; no organomegaly or masses detected., no groin adenopathy  EXT: without deformities, mild arthritic changes; no varicose veins/ +venous insuffic & chr  changes/  tr+ edema. NEURO:  no focal neuro deficits... DERM: clear, no rash     Assessment:

## 2013-07-17 ENCOUNTER — Ambulatory Visit: Payer: No Typology Code available for payment source | Admitting: Pulmonary Disease

## 2013-07-30 ENCOUNTER — Other Ambulatory Visit: Payer: Self-pay | Admitting: Orthopedic Surgery

## 2013-08-02 ENCOUNTER — Encounter: Payer: Self-pay | Admitting: Adult Health

## 2013-08-02 ENCOUNTER — Ambulatory Visit (INDEPENDENT_AMBULATORY_CARE_PROVIDER_SITE_OTHER): Payer: No Typology Code available for payment source | Admitting: Adult Health

## 2013-08-02 VITALS — BP 100/58 | HR 76 | Temp 98.2°F | Resp 14 | Ht 63.0 in | Wt 189.0 lb

## 2013-08-02 DIAGNOSIS — K219 Gastro-esophageal reflux disease without esophagitis: Secondary | ICD-10-CM

## 2013-08-02 NOTE — Progress Notes (Signed)
Patient ID: [name], female   DOB: 03/27/1951, 63 y.o.   MRN: [id number]   Subjective:    Patient ID: [name], female    DOB: [birth year], 63 y.o.   MRN: [id number]  HPI Pt is a pleasant 63 y/o female who presents to clinic to establish care. She is up to date on her PAP and is followed by GYN. Also up to date on her Mammogram. She will need to schedule a physical exam in the near future. We will check labs at that time and discuss/review all screenings. Feeling well overall at the present time. She has some problems with GERD but reports it is 2/2 dietary indiscretions. If she watches what she eats she does not have any problems.    Past Medical History  Diagnosis Date  . Hypertension   . Arthritis   . Elevated cholesterol   . Osteopenia 11/2011    T score -1.9 FRAX 15%/1.8%  . Cervical dysplasia   . History of chicken pox   . GERD (gastroesophageal reflux disease)   . History of colon polyps   . Stroke 1998    x 2  . Urine incontinence   . History of urinary tract infection      Past Surgical History  Procedure Laterality Date  . Oophorectomy  2000    RSO   . Tubal ligation    . Knee surgery  2008    arthroscopic  . Mouth surgery    . Nose surgery      deviated septum  . Back surgery    . Gynecologic cryosurgery      CIN  . Hysteroscopy w/d&c  11/2010    endometrial polyp  . Hysteroscopy  8.28.12  . Colposcopy       Family History  Problem Relation Age of Onset  . Hypertension Mother   . Heart disease Mother   . Diabetes Father   . Hypertension Father   . Heart disease Father   . Stroke Father   . Diabetes Maternal Aunt   . Diabetes Paternal Grandmother   . Hypertension Paternal Grandfather   . Stroke Paternal Grandfather   . Colon cancer Neg Hx      History   Social History  . Marital Status: Married    Spouse Name: N/A    Number of Children: N/A  . Years of Education: 12   Occupational History  . [employer]      Disability/Retired   Social History Main Topics  . Smoking status: Never Smoker   . Smokeless tobacco: Never Used  . Alcohol Use: Yes     Comment: Rare  . Drug Use: No  . Sexual Activity: Yes    Partners: Male    Birth Control/ Protection: Surgical, Post-menopausal   Other Topics Concern  . Not on file   Social History Narrative   [name] grew up in [place], [place]. She lives with her husband [name]). She has 1 daughter [name]), 1 son [name]) and 2 stepchildren ([place]). They have 3 dogs ([name], [name], [name]) and 3 cats ([name], [name], [name]). She enjoys watching movies and outdoors activities.      Exercise - none   Caffeine - varies depending on the day. May have between 1-4 cups daily          Current Outpatient Prescriptions on File Prior to Visit  Medication Sig Dispense Refill  . aspirin 81 MG tablet Take 81 mg by  mouth daily.        Marland Kitchen atenolol-chlorthalidone (TENORETIC) 50-25 MG per tablet Take 1 tablet by mouth daily.  90 tablet  3  . Calcium Carbonate-Vitamin D (CALCIUM + D PO) Take by mouth daily.        . cholecalciferol (VITAMIN D) 1000 UNITS tablet Take 1,000 Units by mouth daily.        Marland Kitchen lisinopril (PRINIVIL,ZESTRIL) 2.5 MG tablet Take 1 tablet (2.5 mg total) by mouth daily.  90 tablet  3  . Multiple Vitamin (MULTIVITAMIN) tablet Take 1 tablet by mouth daily.        . potassium chloride SA (KLOR-CON M20) 20 MEQ tablet Take 1 tablet (20 mEq total) by mouth 2 (two) times daily.  180 tablet  3  . simvastatin (ZOCOR) 20 MG tablet TAKE 1 TABLET DAILY  90 tablet  1   No current facility-administered medications on file prior to visit.     Review of Systems  Constitutional: Negative.   HENT: Negative.   Eyes: Negative.   Respiratory: Negative.   Cardiovascular: Negative.   Gastrointestinal: Negative.   Endocrine: Negative.   Genitourinary: Negative.   Musculoskeletal: Negative.   Skin: Negative.   Allergic/Immunologic: Negative.   Neurological:  Negative.   Hematological: Negative.   Psychiatric/Behavioral: Negative.        Objective:  BP 100/58  Pulse 76  Temp(Src) 98.2 F (36.8 C) (Oral)  Resp 14  Ht 5' 3"  (1.6 m)  Wt 189 lb (85.73 kg)  BMI 33.49 kg/m2  SpO2 95%  LMP 02/09/2005   Physical Exam  Constitutional: She is oriented to person, place, and time. No distress.  HENT:  Head: Normocephalic and atraumatic.  Eyes: Conjunctivae and EOM are normal.  Neck: Normal range of motion. Neck supple.  Cardiovascular: Normal rate, regular rhythm, normal heart sounds and intact distal pulses.  Exam reveals no gallop and no friction rub.   No murmur heard. Pulmonary/Chest: Effort normal and breath sounds normal. No respiratory distress. She has no wheezes. She has no rales.  Musculoskeletal: Normal range of motion.  Neurological: She is alert and oriented to person, place, and time. She has normal reflexes. Coordination normal.  Skin: Skin is warm and dry.  Psychiatric: She has a normal mood and affect. Her behavior is normal. Judgment and thought content normal.      Assessment & Plan:   1. GERD Doing well unless dietary indiscretions. She does not currently take any medication on a regular basis for GERD. She is only taking prn.

## 2013-08-02 NOTE — Progress Notes (Signed)
Pre visit review using our clinic review tool, if applicable. No additional management support is needed unless otherwise documented below in the visit note. 

## 2013-08-02 NOTE — Patient Instructions (Signed)
   Thank you for choosing [place] [facility] for your health care needs.  Please schedule your physical exam at your earliest convenience. You will need to be fasting so that we can draw labs.  Please let your pharmacy know to send me notification of refills when you need them.  Call with any questions or concerns.

## 2013-08-09 DIAGNOSIS — M6740 Ganglion, unspecified site: Secondary | ICD-10-CM

## 2013-08-09 HISTORY — DX: Ganglion, unspecified site: M67.40

## 2013-08-10 ENCOUNTER — Other Ambulatory Visit: Payer: Self-pay | Admitting: Pulmonary Disease

## 2013-08-12 ENCOUNTER — Encounter (HOSPITAL_BASED_OUTPATIENT_CLINIC_OR_DEPARTMENT_OTHER)
Admission: RE | Admit: 2013-08-12 | Discharge: 2013-08-12 | Disposition: A | Discharge status: Home / Self Care | Payer: No Typology Code available for payment source | Source: Ambulatory Visit | Attending: Orthopedic Surgery | Admitting: Orthopedic Surgery

## 2013-08-12 ENCOUNTER — Other Ambulatory Visit: Payer: Self-pay

## 2013-08-12 ENCOUNTER — Encounter (HOSPITAL_BASED_OUTPATIENT_CLINIC_OR_DEPARTMENT_OTHER): Payer: Self-pay | Admitting: *Deleted

## 2013-08-12 LAB — BASIC METABOLIC PANEL
BUN: 14 mg/dL (ref 6–23)
CHLORIDE: 98 meq/L (ref 96–112)
CO2: 24 meq/L (ref 19–32)
Calcium: 9.4 mg/dL (ref 8.4–10.5)
Creatinine, Ser: 0.89 mg/dL (ref 0.50–1.10)
GFR calc Af Amer: 78 mL/min — ABNORMAL LOW (ref >90)
GFR calc non Af Amer: 68 mL/min — ABNORMAL LOW (ref >90)
Glucose, Bld: 107 mg/dL — ABNORMAL HIGH (ref 70–99)
POTASSIUM: 3.5 meq/L — AB (ref 3.7–5.3)
SODIUM: 139 meq/L (ref 137–147)

## 2013-08-12 NOTE — Pre-Procedure Instructions (Signed)
To come for BMET and EKG 

## 2013-08-14 ENCOUNTER — Encounter: Payer: No Typology Code available for payment source | Admitting: Adult Health

## 2013-08-14 NOTE — Progress Notes (Signed)
Dr Al Corpus aware of Potassium of 3.5. No new orders.

## 2013-08-15 ENCOUNTER — Ambulatory Visit (HOSPITAL_BASED_OUTPATIENT_CLINIC_OR_DEPARTMENT_OTHER)
Admission: RE | Admit: 2013-08-15 | Discharge: 2013-08-15 | Disposition: A | Discharge status: Home / Self Care | Payer: No Typology Code available for payment source | Source: Ambulatory Visit | Attending: Orthopedic Surgery | Admitting: Orthopedic Surgery

## 2013-08-15 ENCOUNTER — Encounter (HOSPITAL_BASED_OUTPATIENT_CLINIC_OR_DEPARTMENT_OTHER): Payer: No Typology Code available for payment source | Admitting: Anesthesiology

## 2013-08-15 ENCOUNTER — Ambulatory Visit (HOSPITAL_BASED_OUTPATIENT_CLINIC_OR_DEPARTMENT_OTHER): Payer: No Typology Code available for payment source | Admitting: Anesthesiology

## 2013-08-15 ENCOUNTER — Encounter (HOSPITAL_BASED_OUTPATIENT_CLINIC_OR_DEPARTMENT_OTHER)
Admission: RE | Disposition: A | Discharge status: Home / Self Care | Payer: Self-pay | Source: Ambulatory Visit | Attending: Orthopedic Surgery

## 2013-08-15 ENCOUNTER — Encounter (HOSPITAL_BASED_OUTPATIENT_CLINIC_OR_DEPARTMENT_OTHER): Payer: Self-pay | Admitting: Anesthesiology

## 2013-08-15 DIAGNOSIS — M6740 Ganglion, unspecified site: Secondary | ICD-10-CM | POA: Insufficient documentation

## 2013-08-15 DIAGNOSIS — I69998 Other sequelae following unspecified cerebrovascular disease: Secondary | ICD-10-CM | POA: Insufficient documentation

## 2013-08-15 DIAGNOSIS — R29898 Other symptoms and signs involving the musculoskeletal system: Secondary | ICD-10-CM | POA: Insufficient documentation

## 2013-08-15 DIAGNOSIS — M19049 Primary osteoarthritis, unspecified hand: Secondary | ICD-10-CM | POA: Insufficient documentation

## 2013-08-15 DIAGNOSIS — R32 Unspecified urinary incontinence: Secondary | ICD-10-CM | POA: Insufficient documentation

## 2013-08-15 DIAGNOSIS — I739 Peripheral vascular disease, unspecified: Secondary | ICD-10-CM | POA: Insufficient documentation

## 2013-08-15 DIAGNOSIS — I10 Essential (primary) hypertension: Secondary | ICD-10-CM | POA: Insufficient documentation

## 2013-08-15 DIAGNOSIS — Z7982 Long term (current) use of aspirin: Secondary | ICD-10-CM | POA: Insufficient documentation

## 2013-08-15 DIAGNOSIS — E7800 Pure hypercholesterolemia, unspecified: Secondary | ICD-10-CM | POA: Insufficient documentation

## 2013-08-15 DIAGNOSIS — K219 Gastro-esophageal reflux disease without esophagitis: Secondary | ICD-10-CM | POA: Insufficient documentation

## 2013-08-15 DIAGNOSIS — Z01812 Encounter for preprocedural laboratory examination: Secondary | ICD-10-CM | POA: Insufficient documentation

## 2013-08-15 DIAGNOSIS — N814 Uterovaginal prolapse, unspecified: Secondary | ICD-10-CM | POA: Insufficient documentation

## 2013-08-15 DIAGNOSIS — Z01810 Encounter for preprocedural cardiovascular examination: Secondary | ICD-10-CM | POA: Insufficient documentation

## 2013-08-15 HISTORY — DX: Other seasonal allergic rhinitis: J30.2

## 2013-08-15 HISTORY — DX: Uterovaginal prolapse, unspecified: N81.4

## 2013-08-15 HISTORY — DX: Ganglion, unspecified site: M67.40

## 2013-08-15 HISTORY — PX: MASS EXCISION: SHX2000

## 2013-08-15 HISTORY — DX: Other specified postprocedural states: Z98.890

## 2013-08-15 HISTORY — DX: Nausea with vomiting, unspecified: R11.2

## 2013-08-15 LAB — POCT HEMOGLOBIN-HEMACUE: HEMOGLOBIN: 14.7 g/dL (ref 12.0–15.0)

## 2013-08-15 SURGERY — EXCISION MASS
Anesthesia: Monitor Anesthesia Care | Site: Finger | Laterality: Right

## 2013-08-15 MED ORDER — MIDAZOLAM HCL 2 MG/ML PO SYRP: 12.0000 mg | ORAL_SOLUTION | Freq: Once | ORAL | Status: DC | PRN

## 2013-08-15 MED ORDER — DEXAMETHASONE SODIUM PHOSPHATE 10 MG/ML IJ SOLN
INTRAMUSCULAR | Status: DC | PRN
  Administered 2013-08-15: 10 mg via INTRAVENOUS

## 2013-08-15 MED ORDER — FENTANYL CITRATE 0.05 MG/ML IJ SOLN
INTRAMUSCULAR | Status: AC
  Filled 2013-08-15: qty 6

## 2013-08-15 MED ORDER — ONDANSETRON HCL 4 MG/2ML IJ SOLN
INTRAMUSCULAR | Status: DC | PRN
  Administered 2013-08-15: 4 mg via INTRAVENOUS

## 2013-08-15 MED ORDER — MIDAZOLAM HCL 5 MG/5ML IJ SOLN
INTRAMUSCULAR | Status: DC | PRN
  Administered 2013-08-15 (×2): 1 mg via INTRAVENOUS
  Administered 2013-08-15: 2 mg via INTRAVENOUS

## 2013-08-15 MED ORDER — LACTATED RINGERS IV SOLN
INTRAVENOUS | Status: DC
  Administered 2013-08-15: 08:00:00 via INTRAVENOUS

## 2013-08-15 MED ORDER — BUPIVACAINE HCL (PF) 0.25 % IJ SOLN
INTRAMUSCULAR | Status: AC
  Filled 2013-08-15: qty 30

## 2013-08-15 MED ORDER — OXYCODONE HCL 5 MG/5ML PO SOLN: 5.0000 mg | Freq: Once | ORAL | Status: DC | PRN

## 2013-08-15 MED ORDER — MIDAZOLAM HCL 2 MG/2ML IJ SOLN
INTRAMUSCULAR | Status: AC
  Filled 2013-08-15: qty 6

## 2013-08-15 MED ORDER — OXYCODONE HCL 5 MG PO TABS: 5.0000 mg | ORAL_TABLET | Freq: Once | ORAL | Status: DC | PRN

## 2013-08-15 MED ORDER — FENTANYL CITRATE 0.05 MG/ML IJ SOLN
INTRAMUSCULAR | Status: DC | PRN
  Administered 2013-08-15 (×2): 50 ug via INTRAVENOUS

## 2013-08-15 MED ORDER — OXYCODONE-ACETAMINOPHEN 5-325 MG PO TABS: ORAL_TABLET | ORAL | Status: DC

## 2013-08-15 MED ORDER — FENTANYL CITRATE 0.05 MG/ML IJ SOLN: 50.0000 ug | INTRAMUSCULAR | Status: DC | PRN

## 2013-08-15 MED ORDER — FENTANYL CITRATE 0.05 MG/ML IJ SOLN: 25.0000 ug | INTRAMUSCULAR | Status: DC | PRN

## 2013-08-15 MED ORDER — BUPIVACAINE HCL (PF) 0.25 % IJ SOLN
INTRAMUSCULAR | Status: DC | PRN
  Administered 2013-08-15: 10 mL

## 2013-08-15 MED ORDER — CEFAZOLIN SODIUM-DEXTROSE 2-3 GM-% IV SOLR
INTRAVENOUS | Status: AC
  Filled 2013-08-15: qty 50

## 2013-08-15 MED ORDER — MIDAZOLAM HCL 2 MG/2ML IJ SOLN
1.0000 mg | INTRAMUSCULAR | Status: DC | PRN
Start: 2013-08-15 — End: 2013-08-15

## 2013-08-15 MED ORDER — FENTANYL CITRATE 0.05 MG/ML IJ SOLN
INTRAMUSCULAR | Status: AC
  Filled 2013-08-15: qty 4

## 2013-08-15 MED ORDER — METOCLOPRAMIDE HCL 5 MG/ML IJ SOLN: 10.0000 mg | Freq: Once | INTRAMUSCULAR | Status: DC | PRN

## 2013-08-15 MED ORDER — CHLORHEXIDINE GLUCONATE 4 % EX LIQD: 60.0000 mL | Freq: Once | CUTANEOUS | Status: DC

## 2013-08-15 MED ORDER — CEFAZOLIN SODIUM-DEXTROSE 2-3 GM-% IV SOLR
2.0000 g | INTRAVENOUS | Status: AC
  Administered 2013-08-15: 2 g via INTRAVENOUS

## 2013-08-15 MED ORDER — MIDAZOLAM HCL 2 MG/2ML IJ SOLN
INTRAMUSCULAR | Status: AC
Start: 2013-08-15 — End: 2013-08-15
  Filled 2013-08-15: qty 2

## 2013-08-15 SURGICAL SUPPLY — 54 items
APL SKNCLS STERI-STRIP NONHPOA (GAUZE/BANDAGES/DRESSINGS)
BANDAGE COBAN STERILE 2 (GAUZE/BANDAGES/DRESSINGS) IMPLANT
BANDAGE ELASTIC 3 VELCRO ST LF (GAUZE/BANDAGES/DRESSINGS) IMPLANT
BENZOIN TINCTURE PRP APPL 2/3 (GAUZE/BANDAGES/DRESSINGS) IMPLANT
BLADE 15 SAFETY STRL DISP (BLADE) ×2 IMPLANT
BLADE MINI RND TIP GREEN BEAV (BLADE) IMPLANT
BLADE SURG 15 STRL LF DISP TIS (BLADE) IMPLANT
BLADE SURG 15 STRL SS (BLADE) ×6
BNDG CMPR 9X4 STRL LF SNTH (GAUZE/BANDAGES/DRESSINGS)
BNDG CMPR MD 5X2 ELC HKLP STRL (GAUZE/BANDAGES/DRESSINGS)
BNDG COHESIVE 1X5 TAN STRL LF (GAUZE/BANDAGES/DRESSINGS) ×2 IMPLANT
BNDG CONFORM 2 STRL LF (GAUZE/BANDAGES/DRESSINGS) IMPLANT
BNDG ELASTIC 2 VLCR STRL LF (GAUZE/BANDAGES/DRESSINGS) IMPLANT
BNDG ESMARK 4X9 LF (GAUZE/BANDAGES/DRESSINGS) IMPLANT
BNDG GAUZE 1X2.1 STRL (MISCELLANEOUS) IMPLANT
BNDG GAUZE ELAST 4 BULKY (GAUZE/BANDAGES/DRESSINGS) IMPLANT
BNDG PLASTER X FAST 3X3 WHT LF (CAST SUPPLIES) IMPLANT
BNDG PLSTR 9X3 FST ST WHT (CAST SUPPLIES)
CHLORAPREP W/TINT 26ML (MISCELLANEOUS) ×3 IMPLANT
CORDS BIPOLAR (ELECTRODE) ×3 IMPLANT
COVER MAYO STAND STRL (DRAPES) ×3 IMPLANT
COVER TABLE BACK 60X90 (DRAPES) ×3 IMPLANT
CUFF TOURNIQUET SINGLE 18IN (TOURNIQUET CUFF) ×3 IMPLANT
DRAPE EXTREMITY T 121X128X90 (DRAPE) ×3 IMPLANT
DRAPE SURG 17X23 STRL (DRAPES) ×3 IMPLANT
GAUZE SPONGE 4X4 12PLY STRL (GAUZE/BANDAGES/DRESSINGS) ×3 IMPLANT
GAUZE XEROFORM 1X8 LF (GAUZE/BANDAGES/DRESSINGS) ×3 IMPLANT
GLOVE BIO SURGEON STRL SZ7.5 (GLOVE) ×3 IMPLANT
GLOVE BIOGEL PI IND STRL 8 (GLOVE) ×1 IMPLANT
GLOVE BIOGEL PI INDICATOR 8 (GLOVE) ×2
GOWN STRL REUS W/ TWL LRG LVL3 (GOWN DISPOSABLE) ×1 IMPLANT
GOWN STRL REUS W/TWL LRG LVL3 (GOWN DISPOSABLE) ×3
GOWN STRL REUS W/TWL XL LVL3 (GOWN DISPOSABLE) ×3 IMPLANT
NDL HYPO 25X1 1.5 SAFETY (NEEDLE) ×1 IMPLANT
NEEDLE HYPO 25X1 1.5 SAFETY (NEEDLE) ×3 IMPLANT
NS IRRIG 1000ML POUR BTL (IV SOLUTION) ×3 IMPLANT
PACK BASIN DAY SURGERY FS (CUSTOM PROCEDURE TRAY) ×3 IMPLANT
PAD CAST 3X4 CTTN HI CHSV (CAST SUPPLIES) IMPLANT
PAD CAST 4YDX4 CTTN HI CHSV (CAST SUPPLIES) IMPLANT
PADDING CAST ABS 4INX4YD NS (CAST SUPPLIES)
PADDING CAST ABS COTTON 4X4 ST (CAST SUPPLIES) ×1 IMPLANT
PADDING CAST COTTON 3X4 STRL (CAST SUPPLIES)
PADDING CAST COTTON 4X4 STRL (CAST SUPPLIES)
SPLINT FINGER 5/8X3.25 (SOFTGOODS) IMPLANT
SPLINT FINGER FOAM 3 9119 05 (SOFTGOODS) ×3
STOCKINETTE 4X48 STRL (DRAPES) ×3 IMPLANT
SUT ETHILON 3 0 PS 1 (SUTURE) IMPLANT
SUT ETHILON 4 0 PS 2 18 (SUTURE) ×3 IMPLANT
SUT ETHILON 5 0 P 3 18 (SUTURE)
SUT NYLON ETHILON 5-0 P-3 1X18 (SUTURE) IMPLANT
SYR BULB 3OZ (MISCELLANEOUS) ×3 IMPLANT
SYR CONTROL 10ML LL (SYRINGE) ×3 IMPLANT
TOWEL OR 17X24 6PK STRL BLUE (TOWEL DISPOSABLE) ×6 IMPLANT
UNDERPAD 30X30 INCONTINENT (UNDERPADS AND DIAPERS) ×3 IMPLANT

## 2013-08-15 NOTE — Anesthesia Procedure Notes (Signed)
Anesthesia Regional Block:  Bier block (IV Regional)  Pre-Anesthetic Checklist: ,, timeout performed, Correct Patient, Correct Site, Correct Laterality, Correct Procedure, Correct Position, site marked, Risks and benefits discussed, Surgical consent,  Pre-op evaluation,  At surgeon's request  Laterality: Right  Prep: chloraprep       Needles:       Needle Gauge: 20    Additional Needles: Bier block (IV Regional) Narrative:  Start time: 08/15/2013 8:51 AM End time: 08/15/2013 8:52 AM Injection made incrementally with aspirations every 35 mL.  Performed by: Personally

## 2013-08-15 NOTE — Transfer of Care (Signed)
Immediate Anesthesia Transfer of Care Note  Patient: [name]  Procedure(s) Performed: Procedure(s): RIGHT INDEX EXCISION MASS  (Right)  Patient Location: PACU  Anesthesia Type:Regional  Level of Consciousness: awake, alert , oriented and patient cooperative  Airway & Oxygen Therapy: Patient Spontanous Breathing and Patient connected to face mask oxygen  Post-op Assessment: Report given to PACU RN and Post -op Vital signs reviewed and stable  Post vital signs: Reviewed and stable  Complications: No apparent anesthesia complications

## 2013-08-15 NOTE — H&P (Signed)
[name] is an 63 y.o. female.   Chief Complaint: right index finger mucoid cyst HPI: 63 yo rhd female with mass on right index finger x 1.5 years.  No specific injury noted.  It has broken open and drained in the past.  Most recently drained ~ 3 weeks ago.  No fevers, chills, night sweats.  Past Medical History  Diagnosis Date  . Elevated cholesterol   . PONV (postoperative nausea and vomiting)   . GERD (gastroesophageal reflux disease)     no current med.  . Hypertension     under control with meds., has been on med. x 10-15 yr.  . Stroke 1998    x 2 - mild left-sided weakness  . Mucoid cyst of joint 08/2013    right index finger  . Arthritis     back, fingers  . Seasonal allergies   . Uterine prolapse   . Urinary incontinence     Past Surgical History  Procedure Laterality Date  . Oophorectomy Right 2000  . Tubal ligation    . Back surgery      x 2  . Gynecologic cryosurgery    . Colposcopy    . Nasal septum surgery    . Hysteroscopy w/d&c  12/07/2010    with resection of endometrial polyp  . Knee arthroscopy Left 2008  . Gum surgery    . Colonoscopy  11/30/2012    Family History  Problem Relation Age of Onset  . Hypertension Mother   . Heart disease Mother   . Diabetes Father   . Hypertension Father   . Heart disease Father   . Stroke Father   . Diabetes Maternal Aunt   . Diabetes Paternal Grandmother   . Hypertension Paternal Grandfather   . Stroke Paternal Grandfather    Social History:  reports that she has never smoked. She has never used smokeless tobacco. She reports that she drinks alcohol. She reports that she does not use illicit drugs.  Allergies:  Allergies  Allergen Reactions  . Eggs Or Egg-Derived Products Other (See Comments)    GI UPSET, JOINT PAIN, DIFF. SWALLOWING  . Etodolac Other (See Comments)    ABD. CRAMPING  . Fish-Derived Products Other (See Comments)    GI UPSET, JOINT PAIN, DIFF. SWALLOWING   . Omeprazole Other (See  Comments)    ABD. CRAMPING  . Pineapple Other (See Comments)    GI UPSET, JOINT PAIN, DIFF. SWALLOWING    Medications Prior to Admission  Medication Sig Dispense Refill  . Ascorbic Acid (VITAMIN C) 100 MG tablet Take 100 mg by mouth daily.      Marland Kitchen aspirin 81 MG tablet Take 81 mg by mouth 2 (two) times daily.       Marland Kitchen atenolol-chlorthalidone (TENORETIC) 50-25 MG per tablet TAKE 1 TABLET DAILY  90 tablet  0  . b complex vitamins tablet Take 1 tablet by mouth daily.      . Calcium Carbonate-Vitamin D (CALCIUM + D PO) Take by mouth daily.        . cholecalciferol (VITAMIN D) 1000 UNITS tablet Take 1,000 Units by mouth daily.        . Chromium 1 MG CAPS Take by mouth.      . co-enzyme Q-10 30 MG capsule Take 30 mg by mouth 3 (three) times daily.      . Flaxseed, Linseed, (FLAXSEED OIL) 1000 MG CAPS Take by mouth daily.      . folic acid (FOLVITE) 063  MCG tablet Take 400 mcg by mouth daily.      Marland Kitchen lisinopril (PRINIVIL,ZESTRIL) 2.5 MG tablet Take 1 tablet (2.5 mg total) by mouth daily.  90 tablet  3  . Magnesium 100 MG CAPS Take by mouth daily.      Marland Kitchen MELATONIN ER PO Take by mouth.      . Misc Natural Products (OSTEO BI-FLEX JOINT SHIELD PO) Take by mouth.      . Multiple Vitamin (MULTIVITAMIN) tablet Take 1 tablet by mouth daily.        Marland Kitchen NIACIN CR PO Take by mouth.      . potassium chloride SA (KLOR-CON M20) 20 MEQ tablet Take 1 tablet (20 mEq total) by mouth 2 (two) times daily.  180 tablet  3  . pyridOXINE (VITAMIN B-6) 100 MG tablet Take 100 mg by mouth daily.      . simvastatin (ZOCOR) 20 MG tablet TAKE 1 TABLET DAILY  90 tablet  1  . Zinc Sulfate (ZINC 15 PO) Take by mouth.        No results found for this or any previous visit (from the past 48 hour(s)).  No results found.   A comprehensive review of systems was negative except for: Eyes: positive for contacts/glasses  Blood pressure 111/80, pulse 69, temperature 98.1 F (36.7 C), temperature source Oral, resp. rate 20, height 5'  3" (1.6 m), weight 83.915 kg (185 lb), last menstrual period 02/09/2005, SpO2 97.00%.  General appearance: alert, cooperative and appears stated age Head: Normocephalic, without obvious abnormality, atraumatic Neck: supple, symmetrical, trachea midline Resp: clear to auscultation bilaterally Cardio: regular rate and rhythm GI: non tender Extremities: intact sensation and capillary refill all digits.  +epl/fpl/io.  mass dorsum right index finger distal to dip joint.  no erythema or drainage. Pulses: 2+ and symmetric Skin: Skin color, texture, turgor normal. No rashes or lesions Neurologic: Grossly normal Incision/Wound: none  Assessment/Plan Right index finger mucoid cyst.  Non operative and operative treatment options were discussed with the patient and patient wishes to proceed with operative treatment. Risks, benefits, and alternatives of surgery were discussed and the patient agrees with the plan of care.   Tennis Must 08/15/2013, 8:29 AM

## 2013-08-15 NOTE — Anesthesia Preprocedure Evaluation (Signed)
Anesthesia Evaluation  Patient identified by MRN, date of birth, ID band Patient awake    Reviewed: Allergy & Precautions, H&P , NPO status , Patient's Chart, lab work & pertinent test results, reviewed documented beta blocker date and time   History of Anesthesia Complications (+) PONV and history of anesthetic complications  Airway Mallampati: II TM Distance: >3 FB Neck ROM: full    Dental   Pulmonary neg pulmonary ROS,  breath sounds clear to auscultation        Cardiovascular hypertension, On Medications and On Home Beta Blockers + Peripheral Vascular Disease Rhythm:regular     Neuro/Psych  Headaches, CVA negative psych ROS   GI/Hepatic Neg liver ROS, GERD-  Medicated and Controlled,  Endo/Other  negative endocrine ROS  Renal/GU negative Renal ROS  negative genitourinary   Musculoskeletal   Abdominal   Peds  Hematology negative hematology ROS (+)   Anesthesia Other Findings See surgeon's H&P   Reproductive/Obstetrics negative OB ROS                           Anesthesia Physical Anesthesia Plan  ASA: III  Anesthesia Plan: MAC and Bier Block   Post-op Pain Management:    Induction: Intravenous  Airway Management Planned: Simple Face Mask  Additional Equipment:   Intra-op Plan:   Post-operative Plan:   Informed Consent: I have reviewed the patients History and Physical, chart, labs and discussed the procedure including the risks, benefits and alternatives for the proposed anesthesia with the patient or authorized representative who has indicated his/her understanding and acceptance.   Dental Advisory Given  Plan Discussed with: CRNA and Surgeon  Anesthesia Plan Comments:         Anesthesia Quick Evaluation

## 2013-08-15 NOTE — Discharge Instructions (Addendum)

## 2013-08-15 NOTE — Anesthesia Postprocedure Evaluation (Signed)
Anesthesia Post Note  Patient: [name]  Procedure(s) Performed: Procedure(s) (LRB): RIGHT INDEX EXCISION MASS  (Right)  Anesthesia type: General  Patient location: PACU  Post pain: Pain level controlled  Post assessment: Patient's Cardiovascular Status Stable  Last Vitals:  Filed Vitals:   08/15/13 0945  BP: 97/68  Pulse: 64  Temp:   Resp: 16    Post vital signs: Reviewed and stable  Level of consciousness: alert  Complications: No apparent anesthesia complications

## 2013-08-15 NOTE — Brief Op Note (Signed)
08/15/2013  9:22 AM  PATIENT:  [name]  63 y.o. female  PRE-OPERATIVE DIAGNOSIS:  RIGHT INDEX MUCOID/DISTAL INTERPHANLANGEAL ARTHRITIS  POST-OPERATIVE DIAGNOSIS:  RIGHT INDEX MUCOID/DISTAL INTERPHANLANGEAL ARTHRITIS  PROCEDURE:  Procedure(s): RIGHT INDEX EXCISION MASS  (Right)  SURGEON:  Surgeon(s) and Role:    * Tennis Must, MD - Primary  PHYSICIAN ASSISTANT:   ASSISTANTS: none   ANESTHESIA:   Bier block with sedation  EBL:  Total I/O In: 400 [I.V.:400] Out: -   BLOOD ADMINISTERED:none  DRAINS: none   LOCAL MEDICATIONS USED:  MARCAINE     SPECIMEN:  Source of Specimen:  right index finger  DISPOSITION OF SPECIMEN:  PATHOLOGY  COUNTS:  YES  TOURNIQUET:   Total Tourniquet Time Documented: Forearm (Right) - 28 minutes Total: Forearm (Right) - 28 minutes   DICTATION: .Other Dictation: Dictation Number [id number]  PLAN OF CARE: Discharge to home after PACU  PATIENT DISPOSITION:  PACU - hemodynamically stable.

## 2013-08-15 NOTE — Op Note (Signed)
[id number] 

## 2013-08-16 ENCOUNTER — Encounter (HOSPITAL_BASED_OUTPATIENT_CLINIC_OR_DEPARTMENT_OTHER): Payer: Self-pay | Admitting: Orthopedic Surgery

## 2013-08-16 NOTE — Op Note (Signed)
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  PROCEDURE:  Excision of mucoid cyst of right index finger, debridement of distal interphalangeal joint.  SURGEON:  Leanora Cover, MD  ASSISTANT:  None.  ANESTHESIA:  Bier block with sedation.  IV FLUIDS:  Per Anesthesia flow sheet.  ESTIMATED BLOOD LOSS:  Minimal.  COMPLICATIONS:  None.  SPECIMENS:  Right index finger mucoid cyst to Pathology.  TOURNIQUET TIME:  28 minutes.  DISPOSITION:  Stable to PACU.  INDICATIONS:  [name] is a 63 year old right-hand dominant female who was noted to have a cyst on the left index finger for 1-1/2 years. This was broken open periodically and drained.  She wished to have it excised.  Risks, benefits, and alternatives of surgery were discussed including the risk of blood loss; infection; damage to nerves, vessels, tendons, ligaments, bone; failure of surgery; need for additional surgery; complications with wound healing; continued pain; and recurrence of the cyst.  She voiced understanding of these risks and elected to proceed.  OPERATIVE COURSE:  After being identified preoperatively by myself, the patient and I agreed upon the procedure and site of the procedure. Surgical site was marked.  The risks, benefits, and alternatives of surgery were reviewed and she wished to proceed.  Surgical consent had been signed.  She was given IV Ancef as preoperative antibiotic prophylaxis.  She was transferred to the operating room and placed  on the operating table in supine position with the right upper extremity on an arm board.  Bier block anesthesia was induced by the anesthesiologist.  Right upper extremity was prepped and draped in normal sterile orthopedic fashion.  Surgical pause was performed between surgeons, Anesthesia, and operating room staff, and all were in agreement as to the patient, procedure, and site of the procedure. Tourniquet at the proximal aspect of the forearm had been inflated for the Bier block.  The hockey stick shaped incision was made at the DIP joint of the right index finger.  This was carried into subcutaneous tissues by spreading technique.  Bipolar electrocautery was used to obtain hemostasis.  The cyst was identified and excised.  The synovectomy rongeurs were used to remove it and was sent to Pathology for examination.  The DIP joint was entered underneath the extensor tendon.  It was debrided of synovium and small osteophytes.  The wound and joint were copiously irrigated with sterile saline.  The wound was then closed with 4-0 nylon in a horizontal mattress fashion.  It was dressed with sterile Xeroform, 4x4, and wrapped with a Coban dressing lightly.  An Alumafoam splint was placed and wrapped with Coban dressing lightly.  A digital block was  performed with 10 mL of 0.25% plain Marcaine to aid in postoperative analgesia.  The tourniquet was deflated at 28 minutes.  Fingertips were pink with brisk capillary refill after deflation of tourniquet.  Operative drapes were broken down and the patient was awoken from anesthesia safely.  She was transferred back to the stretcher and taken to the PACU in stable condition.  I will see her back in the office in 1 week for postoperative followup.  I will give her Percocet 5/325, 1-2 p.o. q.6 hours p.r.n. pain, dispensed #30.     Leanora Cover, MD     KK/MEDQ  D:  08/15/2013  T:  08/16/2013  Job:  [id number]

## 2013-09-03 ENCOUNTER — Ambulatory Visit (INDEPENDENT_AMBULATORY_CARE_PROVIDER_SITE_OTHER): Payer: No Typology Code available for payment source | Admitting: Adult Health

## 2013-09-03 ENCOUNTER — Encounter: Payer: Self-pay | Admitting: Adult Health

## 2013-09-03 VITALS — BP 108/68 | HR 77 | Temp 97.5°F | Resp 16 | Ht 63.5 in | Wt 189.8 lb

## 2013-09-03 DIAGNOSIS — K9049 Malabsorption due to intolerance, not elsewhere classified: Secondary | ICD-10-CM

## 2013-09-03 DIAGNOSIS — Z0000 Encounter for general adult medical examination without abnormal findings: Secondary | ICD-10-CM

## 2013-09-03 DIAGNOSIS — K9089 Other intestinal malabsorption: Secondary | ICD-10-CM

## 2013-09-03 NOTE — Progress Notes (Signed)
Patient ID: [name], female   DOB: 10-27-1950, 64 y.o.   MRN: [id number]   Subjective:    Patient ID: [name], female    DOB: [birth year], 63 y.o.   MRN: [id number]  HPI Patient is a pleasant 63 year old female who presents to clinic for her annual physical. She will be following up with her GYN for her mammography, pelvic and Pap. Overall, patient is doing well. Discussed shingles vaccine; however, patient is not interested at this time. She reports wanting further allergy testing for possible food sensitivities. She has noticed intolerances with certain foods. She has had allergy testing in the past and would like additional workup.    Past Medical History  Diagnosis Date  . Elevated cholesterol   . PONV (postoperative nausea and vomiting)   . GERD (gastroesophageal reflux disease)     no current med.  . Hypertension     under control with meds., has been on med. x 10-15 yr.  . Stroke 1998    x 2 - mild left-sided weakness  . Mucoid cyst of joint 08/2013    right index finger  . Arthritis     back, fingers  . Seasonal allergies   . Uterine prolapse   . Urinary incontinence      Past Surgical History  Procedure Laterality Date  . Oophorectomy Right 2000  . Tubal ligation    . Back surgery      x 2  . Gynecologic cryosurgery    . Colposcopy    . Nasal septum surgery    . Hysteroscopy w/d&c  12/07/2010    with resection of endometrial polyp  . Knee arthroscopy Left 2008  . Gum surgery    . Colonoscopy  11/30/2012  . Mass excision Right 08/15/2013    Procedure: RIGHT INDEX EXCISION MASS ;  Surgeon: Tennis Must, MD;  Location: [facility];  Service: Orthopedics;  Laterality: Right;     Family History  Problem Relation Age of Onset  . Hypertension Mother   . Heart disease Mother   . Diabetes Father   . Hypertension Father   . Heart disease Father   . Stroke Father   . Diabetes Maternal Aunt   . Diabetes Paternal Grandmother   .  Hypertension Paternal Grandfather   . Stroke Paternal Grandfather      History   Social History  . Marital Status: Married    Spouse Name: N/A    Number of Children: N/A  . Years of Education: 12   Occupational History  . [employer]     Disability/Retired   Social History Main Topics  . Smoking status: Never Smoker   . Smokeless tobacco: Never Used  . Alcohol Use: Yes     Comment: rarely  . Drug Use: No  . Sexual Activity: Yes    Partners: Male    Birth Control/ Protection: Surgical, Post-menopausal   Other Topics Concern  . Not on file   Social History Narrative   Aileana grew up in [place], [place]. She lives with her husband [name]). She has 1 daughter [name]), 1 son [name]) and 2 stepchildren ([place]). They have 3 dogs ([name], [place], [name]) and 3 cats ([place], [place], [place]). She enjoys watching movies and outdoors activities.      Exercise - none   Caffeine - varies depending on the day. May have between 1-4 cups daily  Current Outpatient Prescriptions on File Prior to Visit  Medication Sig Dispense Refill  . Ascorbic Acid (VITAMIN C) 100 MG tablet Take 100 mg by mouth daily.      Marland Kitchen aspirin 81 MG tablet Take 81 mg by mouth 2 (two) times daily.       Marland Kitchen atenolol-chlorthalidone (TENORETIC) 50-25 MG per tablet TAKE 1 TABLET DAILY  90 tablet  0  . b complex vitamins tablet Take 1 tablet by mouth daily.      . Calcium Carbonate-Vitamin D (CALCIUM + D PO) Take by mouth daily.        . cholecalciferol (VITAMIN D) 1000 UNITS tablet Take 1,000 Units by mouth daily.        . Chromium 1 MG CAPS Take by mouth.      . co-enzyme Q-10 30 MG capsule Take 30 mg by mouth 3 (three) times daily.      . Flaxseed, Linseed, (FLAXSEED OIL) 1000 MG CAPS Take by mouth daily.      . folic acid (FOLVITE) 956 MCG tablet Take 400 mcg by mouth daily.      Marland Kitchen lisinopril (PRINIVIL,ZESTRIL) 2.5 MG tablet Take 1 tablet (2.5 mg total) by mouth daily.  90 tablet   3  . Magnesium 100 MG CAPS Take by mouth daily.      Marland Kitchen MELATONIN ER PO Take by mouth.      . Misc Natural Products (OSTEO BI-FLEX JOINT SHIELD PO) Take by mouth.      . Multiple Vitamin (MULTIVITAMIN) tablet Take 1 tablet by mouth daily.        Marland Kitchen NIACIN CR PO Take by mouth.      . potassium chloride SA (KLOR-CON M20) 20 MEQ tablet Take 1 tablet (20 mEq total) by mouth 2 (two) times daily.  180 tablet  3  . pyridOXINE (VITAMIN B-6) 100 MG tablet Take 100 mg by mouth daily.      . simvastatin (ZOCOR) 20 MG tablet TAKE 1 TABLET DAILY  90 tablet  1  . Zinc Sulfate (ZINC 15 PO) Take by mouth.       No current facility-administered medications on file prior to visit.     Review of Systems  Constitutional: Negative.   HENT: Negative.   Eyes: Negative.   Respiratory: Negative.   Cardiovascular: Negative.   Gastrointestinal: Negative.   Endocrine: Negative.   Genitourinary: Negative.   Musculoskeletal: Negative.   Skin: Negative.   Allergic/Immunologic: Negative.   Neurological: Negative.   Hematological: Negative.   Psychiatric/Behavioral: Negative.        Objective:  LMP 02/09/2005   Physical Exam  Constitutional: She is oriented to person, place, and time. She appears well-developed and well-nourished. No distress.  HENT:  Head: Normocephalic and atraumatic.  Right Ear: External ear normal.  Left Ear: External ear normal.  Nose: Nose normal.  Mouth/Throat: Oropharynx is clear and moist.  Eyes: Conjunctivae and EOM are normal. Pupils are equal, round, and reactive to light.  Neck: Normal range of motion. Neck supple. No tracheal deviation present. No thyromegaly present.  Cardiovascular: Normal rate, regular rhythm, normal heart sounds and intact distal pulses.  Exam reveals no gallop and no friction rub.   No murmur heard. Pulmonary/Chest: Effort normal and breath sounds normal. No respiratory distress. She has no wheezes. She has no rales.  Abdominal: Soft. Bowel sounds  are normal. She exhibits no distension and no mass. There is no tenderness. There is no rebound and no guarding.  Genitourinary:  Deferred. Pt is followed by GYN.  Musculoskeletal: Normal range of motion. She exhibits no edema and no tenderness.  Lymphadenopathy:    She has no cervical adenopathy.  Neurological: She is alert and oriented to person, place, and time. She has normal reflexes. No cranial nerve deficit. Coordination normal.  Skin: Skin is warm and dry.  Psychiatric: She has a normal mood and affect. Her behavior is normal. Judgment and thought content normal.       Assessment & Plan:   1. Routine general medical examination at a health care facility Normal physical exam. Check labs including A1c for elevated blood glucose in the past. Has also had recent elevated liver enzyme and low potassium so I will check this today as well. Screenings addressed. She is not interested in shingles vaccine at this time. - Lipid panel - Hemoglobin A1c; Future - Comp Met (CMET) - Hemoglobin A1c  2. 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 - Ambulatory referral to Allergy

## 2013-09-03 NOTE — Progress Notes (Signed)
Pre visit review using our clinic review tool, if applicable. No additional management support is needed unless otherwise documented below in the visit note. 

## 2013-09-03 NOTE — Patient Instructions (Addendum)
  You had your annual physical exam today.  Please have your labs drawn prior to leaving the office.  I will contact you with the results and any further instructions as needed.  Please feel free to call with any questions or concerns.  I am referring you to [facility] for further evaluation. If you have not heard back from our office by Monday please call to follow up on your appointment.

## 2013-09-04 ENCOUNTER — Encounter: Payer: Self-pay | Admitting: Adult Health

## 2013-09-04 LAB — COMPREHENSIVE METABOLIC PANEL
ALBUMIN: 4.1 g/dL (ref 3.5–5.2)
ALT: 21 U/L (ref 0–35)
AST: 32 U/L (ref 0–37)
Alkaline Phosphatase: 53 U/L (ref 39–117)
BUN: 14 mg/dL (ref 6–23)
CO2: 27 mEq/L (ref 19–32)
Calcium: 9.4 mg/dL (ref 8.4–10.5)
Chloride: 103 mEq/L (ref 96–112)
Creat: 0.91 mg/dL (ref 0.50–1.10)
GLUCOSE: 102 mg/dL — AB (ref 70–99)
POTASSIUM: 4.2 meq/L (ref 3.5–5.3)
Sodium: 141 mEq/L (ref 135–145)
Total Bilirubin: 0.4 mg/dL (ref 0.2–1.2)
Total Protein: 6.6 g/dL (ref 6.0–8.3)

## 2013-09-04 LAB — HEMOGLOBIN A1C
Hgb A1c MFr Bld: 5.9 % — ABNORMAL HIGH (ref <5.7)
MEAN PLASMA GLUCOSE: 123 mg/dL — AB (ref <117)

## 2013-09-04 LAB — LIPID PANEL
CHOLESTEROL: 152 mg/dL (ref 0–200)
HDL: 43 mg/dL (ref >39)
LDL Cholesterol: 91 mg/dL (ref 0–99)
Total CHOL/HDL Ratio: 3.5 Ratio
Triglycerides: 90 mg/dL (ref <150)
VLDL: 18 mg/dL (ref 0–40)

## 2013-10-01 LAB — HM MAMMOGRAPHY: HM Mammogram: NEGATIVE

## 2013-10-02 ENCOUNTER — Encounter: Payer: Self-pay | Admitting: *Deleted

## 2013-10-03 ENCOUNTER — Encounter: Payer: Self-pay | Admitting: Gynecology

## 2013-10-24 ENCOUNTER — Ambulatory Visit (HOSPITAL_COMMUNITY)
Admission: RE | Admit: 2013-10-24 | Discharge: 2013-10-24 | Disposition: A | Discharge status: Home / Self Care | Payer: 59 | Source: Ambulatory Visit | Attending: Chiropractic Medicine | Admitting: Chiropractic Medicine

## 2013-10-24 ENCOUNTER — Other Ambulatory Visit (HOSPITAL_COMMUNITY): Payer: Self-pay | Admitting: Chiropractic Medicine

## 2013-10-24 DIAGNOSIS — S4980XA Other specified injuries of shoulder and upper arm, unspecified arm, initial encounter: Secondary | ICD-10-CM | POA: Insufficient documentation

## 2013-10-24 DIAGNOSIS — X58XXXA Exposure to other specified factors, initial encounter: Secondary | ICD-10-CM | POA: Insufficient documentation

## 2013-10-24 DIAGNOSIS — M898X9 Other specified disorders of bone, unspecified site: Secondary | ICD-10-CM | POA: Insufficient documentation

## 2013-10-24 DIAGNOSIS — M25519 Pain in unspecified shoulder: Secondary | ICD-10-CM | POA: Insufficient documentation

## 2013-10-24 DIAGNOSIS — M25511 Pain in right shoulder: Secondary | ICD-10-CM

## 2013-10-24 DIAGNOSIS — S46909A Unspecified injury of unspecified muscle, fascia and tendon at shoulder and upper arm level, unspecified arm, initial encounter: Secondary | ICD-10-CM | POA: Insufficient documentation

## 2013-11-03 ENCOUNTER — Other Ambulatory Visit: Payer: Self-pay | Admitting: Pulmonary Disease

## 2013-11-06 ENCOUNTER — Encounter: Payer: No Typology Code available for payment source | Admitting: Gynecology

## 2013-11-11 ENCOUNTER — Encounter: Payer: Self-pay | Admitting: Gynecology

## 2013-11-11 ENCOUNTER — Ambulatory Visit (INDEPENDENT_AMBULATORY_CARE_PROVIDER_SITE_OTHER): Payer: 59 | Admitting: Gynecology

## 2013-11-11 ENCOUNTER — Other Ambulatory Visit (HOSPITAL_COMMUNITY)
Admission: RE | Admit: 2013-11-11 | Discharge: 2013-11-11 | Disposition: A | Discharge status: Home / Self Care | Payer: No Typology Code available for payment source | Source: Ambulatory Visit | Attending: Gynecology | Admitting: Gynecology

## 2013-11-11 ENCOUNTER — Other Ambulatory Visit (HOSPITAL_COMMUNITY)
Admission: RE | Admit: 2013-11-11 | Discharge: 2013-11-11 | Disposition: A | Discharge status: Home / Self Care | Payer: 59 | Source: Ambulatory Visit | Attending: Gynecology | Admitting: Gynecology

## 2013-11-11 VITALS — BP 120/74 | Ht 62.0 in | Wt 191.0 lb

## 2013-11-11 DIAGNOSIS — Z01419 Encounter for gynecological examination (general) (routine) without abnormal findings: Secondary | ICD-10-CM | POA: Insufficient documentation

## 2013-11-11 DIAGNOSIS — M899 Disorder of bone, unspecified: Secondary | ICD-10-CM

## 2013-11-11 DIAGNOSIS — N952 Postmenopausal atrophic vaginitis: Secondary | ICD-10-CM

## 2013-11-11 DIAGNOSIS — M8580 Other specified disorders of bone density and structure, unspecified site: Secondary | ICD-10-CM

## 2013-11-11 DIAGNOSIS — Z124 Encounter for screening for malignant neoplasm of cervix: Secondary | ICD-10-CM

## 2013-11-11 DIAGNOSIS — N3281 Overactive bladder: Secondary | ICD-10-CM

## 2013-11-11 DIAGNOSIS — Z1151 Encounter for screening for human papillomavirus (HPV): Secondary | ICD-10-CM | POA: Insufficient documentation

## 2013-11-11 DIAGNOSIS — N8111 Cystocele, midline: Secondary | ICD-10-CM

## 2013-11-11 DIAGNOSIS — N318 Other neuromuscular dysfunction of bladder: Secondary | ICD-10-CM

## 2013-11-11 DIAGNOSIS — M949 Disorder of cartilage, unspecified: Secondary | ICD-10-CM

## 2013-11-11 NOTE — Progress Notes (Signed)
[name] 20-Feb-1951 [id number]        63 y.o.  G2P2002 for annual exam.  Several issues noted below.  Past medical history,surgical history, problem list, medications, allergies, family history and social history were all reviewed and documented as reviewed in the EPIC chart.  ROS:  12 system ROS performed with pertinent positives and negatives included in the history, assessment and plan.   Additional significant findings :  None   Exam: Kim Counsellor Vitals:   11/11/13 1446  BP: 120/74  Height: 5' 2"  (1.575 m)  Weight: 191 lb (86.637 kg)   General appearance:  Normal affect, orientation and appearance. Skin: Grossly normal HEENT: Without gross lesions.  No cervical or supraclavicular adenopathy. Thyroid normal.  Lungs:  Clear without wheezing, rales or rhonchi Cardiac: RR, without RMG Abdominal:  Soft, nontender, without masses, guarding, rebound, organomegaly or hernia Breasts:  Examined lying and sitting without masses, retractions, discharge or axillary adenopathy. Pelvic:  Ext/BUS/vagina normal with generalized atrophic changes.  Cervix with atrophic changes. Pap/HPV done.  Uterus anteverted, normal size, shape and contour, midline and mobile nontender   Adnexa  Without masses or tenderness    Anus and perineum  Normal   Rectovaginal  Normal sphincter tone without palpated masses or tenderness.    Assessment/Plan:  63 y.o. G71P2002 female for annual exam.   1. Postmenopausal/atrophic genital changes. Patient without significant symptoms of hot flushes, night sweats, vaginal dryness or dyspareunia. No vaginal bleeding. Continue to monitor. Report any vaginal bleeding. 2. Urge incontinence type symptoms. Chronic in nature. Mostly when has a full bladder. Check urinalysis. Reviewed options to include behavior modification versus medication trial. Patient not interested in medication. Prefers just to monitor at present. 3. Cystocele. Patient has first-degree  cystocele, unchanged from prior exam. Patient is asymptomatic without pressure or other symptoms. Will continue to monitor. 4. Osteopenia. DEXA 11/2011 T score -1.9. FRAX 15%/1.8%. Repeat DEXA now a two-year interval. Increase calcium vitamin D reviewed. 5. Pap smear 2012. Pap/HPV today. History of cryosurgery a number of years ago. Plan repeat Pap smear at 3-5 year interval assuming this Pap smear is normal according to current screening guidelines. 6. Mammography 09/2013. Continue with annual mammography. SBE monthly reviewed. 7. Colonoscopy 2014. Repeat at their recommended interval. 8. Health maintenance. No routine blood work drawn and she has this done through her primary physician's office. Followup in one year, sooner as needed.  Note: This document was prepared with digital dictation and possible smart phrase technology. Any transcriptional errors that result from this process are unintentional.   Anastasio Auerbach MD, 4:04 PM 11/11/2013

## 2013-11-11 NOTE — Patient Instructions (Signed)
Followup for bone density as scheduled. Followup in one year for annual exam.  You may obtain a copy of any labs that were done today by logging onto MyChart as outlined in the instructions provided with your AVS (after visit summary). The office will not call with normal lab results but certainly if there are any significant abnormalities then we will contact you.   Health Maintenance, Female A healthy lifestyle and preventative care can promote health and wellness.  Maintain regular health, dental, and eye exams.  Eat a healthy diet. Foods like vegetables, fruits, whole grains, low-fat dairy products, and lean protein foods contain the nutrients you need without too many calories. Decrease your intake of foods high in solid fats, added sugars, and salt. Get information about a proper diet from your caregiver, if necessary.  Regular physical exercise is one of the most important things you can do for your health. Most adults should get at least 150 minutes of moderate-intensity exercise (any activity that increases your heart rate and causes you to sweat) each week. In addition, most adults need muscle-strengthening exercises on 2 or more days a week.   Maintain a healthy weight. The body mass index (BMI) is a screening tool to identify possible weight problems. It provides an estimate of body fat based on height and weight. Your caregiver can help determine your BMI, and can help you achieve or maintain a healthy weight. For adults 20 years and older:  A BMI below 18.5 is considered underweight.  A BMI of 18.5 to 24.9 is normal.  A BMI of 25 to 29.9 is considered overweight.  A BMI of 30 and above is considered obese.  Maintain normal blood lipids and cholesterol by exercising and minimizing your intake of saturated fat. Eat a balanced diet with plenty of fruits and vegetables. Blood tests for lipids and cholesterol should begin at age 20 and be repeated every 5 years. If your lipid or  cholesterol levels are high, you are over 50, or you are a high risk for heart disease, you may need your cholesterol levels checked more frequently.Ongoing high lipid and cholesterol levels should be treated with medicines if diet and exercise are not effective.  If you smoke, find out from your caregiver how to quit. If you do not use tobacco, do not start.  Lung cancer screening is recommended for adults aged 55 80 years who are at high risk for developing lung cancer because of a history of smoking. Yearly low-dose computed tomography (CT) is recommended for people who have at least a 30-pack-year history of smoking and are a current smoker or have quit within the past 15 years. A pack year of smoking is smoking an average of 1 pack of cigarettes a day for 1 year (for example: 1 pack a day for 30 years or 2 packs a day for 15 years). Yearly screening should continue until the smoker has stopped smoking for at least 15 years. Yearly screening should also be stopped for people who develop a health problem that would prevent them from having lung cancer treatment.  If you are pregnant, do not drink alcohol. If you are breastfeeding, be very cautious about drinking alcohol. If you are not pregnant and choose to drink alcohol, do not exceed 1 drink per day. One drink is considered to be 12 ounces (355 mL) of beer, 5 ounces (148 mL) of wine, or 1.5 ounces (44 mL) of liquor.  Avoid use of street drugs. Do not share   needles with anyone. Ask for help if you need support or instructions about stopping the use of drugs.  High blood pressure causes heart disease and increases the risk of stroke. Blood pressure should be checked at least every 1 to 2 years. Ongoing high blood pressure should be treated with medicines, if weight loss and exercise are not effective.  If you are 55 to 63 years old, ask your caregiver if you should take aspirin to prevent strokes.  Diabetes screening involves taking a blood sample  to check your fasting blood sugar level. This should be done once every 3 years, after age 45, if you are within normal weight and without risk factors for diabetes. Testing should be considered at a younger age or be carried out more frequently if you are overweight and have at least 1 risk factor for diabetes.  Breast cancer screening is essential preventative care for women. You should practice "breast self-awareness." This means understanding the normal appearance and feel of your breasts and may include breast self-examination. Any changes detected, no matter how small, should be reported to a caregiver. Women in their 20s and 30s should have a clinical breast exam (CBE) by a caregiver as part of a regular health exam every 1 to 3 years. After age 40, women should have a CBE every year. Starting at age 40, women should consider having a mammogram (breast X-ray) every year. Women who have a family history of breast cancer should talk to their caregiver about genetic screening. Women at a high risk of breast cancer should talk to their caregiver about having an MRI and a mammogram every year.  Breast cancer gene (BRCA)-related cancer risk assessment is recommended for women who have family members with BRCA-related cancers. BRCA-related cancers include breast, ovarian, tubal, and peritoneal cancers. Having family members with these cancers may be associated with an increased risk for harmful changes (mutations) in the breast cancer genes BRCA1 and BRCA2. Results of the assessment will determine the need for genetic counseling and BRCA1 and BRCA2 testing.  The Pap test is a screening test for cervical cancer. Women should have a Pap test starting at age 21. Between ages 21 and 29, Pap tests should be repeated every 2 years. Beginning at age 30, you should have a Pap test every 3 years as long as the past 3 Pap tests have been normal. If you had a hysterectomy for a problem that was not cancer or a condition  that could lead to cancer, then you no longer need Pap tests. If you are between ages 65 and 70, and you have had normal Pap tests going back 10 years, you no longer need Pap tests. If you have had past treatment for cervical cancer or a condition that could lead to cancer, you need Pap tests and screening for cancer for at least 20 years after your treatment. If Pap tests have been discontinued, risk factors (such as a new sexual partner) need to be reassessed to determine if screening should be resumed. Some women have medical problems that increase the chance of getting cervical cancer. In these cases, your caregiver may recommend more frequent screening and Pap tests.  The human papillomavirus (HPV) test is an additional test that may be used for cervical cancer screening. The HPV test looks for the virus that can cause the cell changes on the cervix. The cells collected during the Pap test can be tested for HPV. The HPV test could be used to screen women   women aged 30 years and older, and should be used in women of any age who have unclear Pap test results. After the age of 30, women should have HPV testing at the same frequency as a Pap test.  Colorectal cancer can be detected and often prevented. Most routine colorectal cancer screening begins at the age of 50 and continues through age 75. However, your caregiver may recommend screening at an earlier age if you have risk factors for colon cancer. On a yearly basis, your caregiver may provide home test kits to check for hidden blood in the stool. Use of a small camera at the end of a tube, to directly examine the colon (sigmoidoscopy or colonoscopy), can detect the earliest forms of colorectal cancer. Talk to your caregiver about this at age 50, when routine screening begins. Direct examination of the colon should be repeated every 5 to 10 years through age 75, unless early forms of pre-cancerous polyps or small growths are found.  Hepatitis C blood testing is  recommended for all people born from 1945 through 1965 and any individual with known risks for hepatitis C.  Practice safe sex. Use condoms and avoid high-risk sexual practices to reduce the spread of sexually transmitted infections (STIs). Sexually active women aged 25 and younger should be checked for Chlamydia, which is a common sexually transmitted infection. Older women with new or multiple partners should also be tested for Chlamydia. Testing for other STIs is recommended if you are sexually active and at increased risk.  Osteoporosis is a disease in which the bones lose minerals and strength with aging. This can result in serious bone fractures. The risk of osteoporosis can be identified using a bone density scan. Women ages 65 and over and women at risk for fractures or osteoporosis should discuss screening with their caregivers. Ask your caregiver whether you should be taking a calcium supplement or vitamin D to reduce the rate of osteoporosis.  Menopause can be associated with physical symptoms and risks. Hormone replacement therapy is available to decrease symptoms and risks. You should talk to your caregiver about whether hormone replacement therapy is right for you.  Use sunscreen. Apply sunscreen liberally and repeatedly throughout the day. You should seek shade when your shadow is shorter than you. Protect yourself by wearing long sleeves, pants, a wide-brimmed hat, and sunglasses year round, whenever you are outdoors.  Notify your caregiver of new moles or changes in moles, especially if there is a change in shape or color. Also notify your caregiver if a mole is larger than the size of a pencil eraser.  Stay current with your immunizations. Document Released: 10/11/2010 Document Revised: 07/23/2012 Document Reviewed: 10/11/2010 [employer] Patient Information 2014 [employer].   

## 2013-11-11 NOTE — Addendum Note (Signed)
Addended by: Nelva Nay on: 11/11/2013 04:11 PM   Modules accepted: Orders

## 2013-11-12 LAB — URINALYSIS W MICROSCOPIC + REFLEX CULTURE
BACTERIA UA: NONE SEEN
Bilirubin Urine: NEGATIVE
Casts: NONE SEEN
Crystals: NONE SEEN
Glucose, UA: NEGATIVE mg/dL
HGB URINE DIPSTICK: NEGATIVE
LEUKOCYTES UA: NEGATIVE
NITRITE: NEGATIVE
PH: 8 (ref 5.0–8.0)
Protein, ur: NEGATIVE mg/dL
SPECIFIC GRAVITY, URINE: 1.005 (ref 1.005–1.030)
UROBILINOGEN UA: 0.2 mg/dL (ref 0.0–1.0)

## 2013-11-13 LAB — CYTOLOGY - PAP

## 2013-11-20 NOTE — H&P (Signed)
Joni Fears, MD   Biagio Borg, PA-C [address] [place],   [address]                             [phone]   ORTHOPAEDIC HISTORY & PHYSICAL  PHALA SCHRAEDER MRN:  [id number] DOB/SEX:  05/25/1950/female  CHIEF COMPLAINT:  Painful right shoulder  HISTORY:Ms. Buchner is 63 years old and visits the office for evaluation of right shoulder pain.  She fell off a truck on 06/04/2013 onto a concrete floor and she has been having trouble with her shoulder ever since that time.  Pain is mostly along the lateral and subacromial region.  It is worse with overhead activity and even with sleeping.  She has tried massage therapy and chiropractic treatment.  She has tried Advil and despite all of that she is still having compromise of her activities and pain.  The pain does refer from the shoulder to the elbow and she has had a fair amount of "weakness." MRI was ordered.  The MRI scan notes that she does have a complete tear of both with retraction to the level of the glenohumeral joint.  The teres minor was intact as was the subscapularis.  She had mild atrophy of the infraspinatus muscle and no atrophy of the subscapularis, teres minor or supraspinatus.  She had mild tendinosis of the interarticular portion of the biceps tendon.  She had moderate AC joint discomfort and type II acromion.  There was no significant joint effusion of the glenohumeral joint and no dislocation.  She had some mild partial cartilage loss of the inferior humeral head.  She had some mild reactive marrow changes in the superolateral humeral head probably consistent with her cuff tear.  Scheduled for surgery  PAST MEDICAL HISTORY: Patient Active Problem List   Diagnosis Date Noted  . Rib pain 06/24/2013  . Tick bite 08/06/2012  . Skin ulcer 08/06/2012  . Impaired fasting glucose 06/13/2011  . Overweight 06/13/2011  . SHOULDER PAIN, BILATERAL 12/23/2009  . HEADACHE 11/17/2009  . Nonspecific (abnormal)  findings on radiological and other examination of body structure 09/30/2007  . ABNORMAL CHEST XRAY 09/30/2007  . COLONIC POLYPS 02/17/2007  . HYPERLIPIDEMIA 02/17/2007  . ANXIETY 01/08/2007  . HYPERTENSION 01/08/2007  . VENOUS INSUFFICIENCY 01/08/2007  . GASTROESOPHAGEAL REFLUX DISEASE 01/08/2007  . DEGENERATIVE JOINT DISEASE 01/08/2007  . [place], [place] 01/08/2007   Past Medical History  Diagnosis Date  . Elevated cholesterol   . PONV (postoperative nausea and vomiting)   . GERD (gastroesophageal reflux disease)     no current med.  . Hypertension     under control with meds., has been on med. x 10-15 yr.  . Stroke 1998    x 2 - mild left-sided weakness  . Mucoid cyst of joint 08/2013    right index finger  . Arthritis     back, fingers  . Seasonal allergies   . Urinary incontinence   . Osteopenia 11/2011    T score -1.9 FRAX 15%/1.8%   Past Surgical History  Procedure Laterality Date  . Oophorectomy Right 2000  . Tubal ligation    . Back surgery      x 2  . Gynecologic cryosurgery    . Nasal septum surgery    . Hysteroscopy w/d&c  12/07/2010    with resection of endometrial polyp  . Knee arthroscopy Left 2008  . Gum surgery    .  Colonoscopy  11/30/2012  . Mass excision Right 08/15/2013    Procedure: RIGHT INDEX EXCISION MASS ;  Surgeon: Tennis Must, MD;  Location: [facility];  Service: Orthopedics;  Laterality: Right;     MEDICATIONS:   No prescriptions prior to admission    ALLERGIES:   Allergies  Allergen Reactions  . Eggs Or Egg-Derived Products Other (See Comments)    GI UPSET, JOINT PAIN, DIFF. SWALLOWING  . Etodolac Other (See Comments)    ABD. CRAMPING  . Fish-Derived Products Other (See Comments)    GI UPSET, JOINT PAIN, DIFF. SWALLOWING   . Omeprazole Other (See Comments)    ABD. CRAMPING  . Pineapple Other (See Comments)    GI UPSET, JOINT PAIN, DIFF. SWALLOWING    REVIEW OF SYSTEMS:  A comprehensive review of systems was  negative except for: Cardiovascular: positive for hypertension Neurological: positive for stroke   FAMILY HISTORY:   Family History  Problem Relation Age of Onset  . Hypertension Mother   . Heart disease Mother   . Diabetes Father   . Hypertension Father   . Heart disease Father   . Stroke Father   . Diabetes Maternal Aunt   . Diabetes Paternal Grandmother   . Hypertension Paternal Grandfather   . Stroke Paternal Grandfather     SOCIAL HISTORY:   History  Substance Use Topics  . Smoking status: Never Smoker   . Smokeless tobacco: Never Used  . Alcohol Use: Yes     Comment: rarely      EXAMINATION: Vital signs in last 24 hours:    Head is normocephalic.   Eyes:  Pupils equal, round and reactive to light and accommodation.  Extraocular intact. ENT: Ears, nose, and throat were benign.   Neck: supple, no bruits were noted.   Chest: good expansion.   Lungs: essentially clear.   Cardiac: regular rhythm and rate, normal S1, S2.  No murmurs appreciated. Pulses :  2+ bilateral and symmetric in upper extremities. Abdomen is scaphoid, soft, nontender, no masses palpable, normal bowel sounds                  present. CNS:  He is oriented x3 and cranial nerves II-XII grossly intact. Breast, rectal, and genital exams: not performed and not indicated for an orthopedic evaluation. Musculoskeletal: She had a positive empty can test and very minimal impingement symptoms.  She was definitely weak is external rotation and not so much in internal rotation.  She does have some tenderness over the anterior aspect of her shoulder at the insertion of the infraspinatus and supraspinatus and does have some referred pain in the lateral subacromial region with abduction.     Imaging Review The MRI scan notes that she does have a complete tear of both with retraction to the level of the glenohumeral joint.  The teres minor was intact as was the subscapularis.  She had mild atrophy of the  infraspinatus muscle and no atrophy of the subscapularis, teres minor or supraspinatus.  She had mild tendinosis of the interarticular portion of the biceps tendon.  She had moderate AC joint discomfort and type II acromion.  There was no significant joint effusion of the glenohumeral joint and no dislocation.  She had some mild partial cartilage loss of the inferior humeral head.  She had some mild reactive marrow changes in the superolateral humeral head probably consistent with her cuff tear.    ASSESSMENT: right Rotator cuff tear involving both infraspinatus  and supraspinatus tendons  Past Medical History  Diagnosis Date  . Elevated cholesterol   . PONV (postoperative nausea and vomiting)   . GERD (gastroesophageal reflux disease)     no current med.  . Hypertension     under control with meds., has been on med. x 10-15 yr.  . Stroke 1998    x 2 - mild left-sided weakness  . Mucoid cyst of joint 08/2013    right index finger  . Arthritis     back, fingers  . Seasonal allergies   . Urinary incontinence   . Osteopenia 11/2011    T score -1.9 FRAX 15%/1.8%    PLAN: Plan for right shoulder is  that the best approach is an arthroscopic evaluation, perform an SAD, DCR and debride the joint of any synovitis or any loose material with a mini open rotator cuff repair and probably use a DermaSpan biological patch.   The procedure,  risks, and benefits of surgery were presented and reviewed. The risks including but not limited to infection, blood clots, vascular and nerve injury, stiffness,  among others were discussed. The patient acknowledged the explanation, agreed to proceed.   Mike Craze [place], [place] [phone]  11/20/2013 1:19 PM

## 2013-11-22 ENCOUNTER — Encounter (HOSPITAL_COMMUNITY): Payer: Self-pay | Admitting: Pharmacy Technician

## 2013-11-25 ENCOUNTER — Encounter (HOSPITAL_COMMUNITY)
Admission: RE | Admit: 2013-11-25 | Discharge: 2013-11-25 | Disposition: A | Discharge status: Home / Self Care | Payer: No Typology Code available for payment source | Source: Ambulatory Visit | Attending: Orthopaedic Surgery | Admitting: Orthopaedic Surgery

## 2013-11-25 ENCOUNTER — Encounter (HOSPITAL_COMMUNITY): Payer: Self-pay

## 2013-11-25 ENCOUNTER — Ambulatory Visit (HOSPITAL_COMMUNITY)
Admission: RE | Admit: 2013-11-25 | Discharge: 2013-11-25 | Disposition: A | Discharge status: Home / Self Care | Payer: No Typology Code available for payment source | Source: Ambulatory Visit | Attending: Anesthesiology | Admitting: Anesthesiology

## 2013-11-25 DIAGNOSIS — Z01818 Encounter for other preprocedural examination: Secondary | ICD-10-CM | POA: Insufficient documentation

## 2013-11-25 HISTORY — DX: Other chronic pain: G89.29

## 2013-11-25 HISTORY — DX: Dorsalgia, unspecified: M54.9

## 2013-11-25 HISTORY — DX: Headache: R51

## 2013-11-25 LAB — BASIC METABOLIC PANEL
ANION GAP: 15 (ref 5–15)
BUN: 16 mg/dL (ref 6–23)
CO2: 25 meq/L (ref 19–32)
CREATININE: 1.02 mg/dL (ref 0.50–1.10)
Calcium: 10 mg/dL (ref 8.4–10.5)
Chloride: 103 mEq/L (ref 96–112)
GFR calc Af Amer: 66 mL/min — ABNORMAL LOW (ref >90)
GFR calc non Af Amer: 57 mL/min — ABNORMAL LOW (ref >90)
GLUCOSE: 81 mg/dL (ref 70–99)
Potassium: 3.8 mEq/L (ref 3.7–5.3)
SODIUM: 143 meq/L (ref 137–147)

## 2013-11-25 LAB — CBC
HEMATOCRIT: 45.5 % (ref 36.0–46.0)
Hemoglobin: 15.2 g/dL — ABNORMAL HIGH (ref 12.0–15.0)
MCH: 30.2 pg (ref 26.0–34.0)
MCHC: 33.4 g/dL (ref 30.0–36.0)
MCV: 90.3 fL (ref 78.0–100.0)
PLATELETS: 227 10*3/uL (ref 150–400)
RBC: 5.04 MIL/uL (ref 3.87–5.11)
RDW: 13.5 % (ref 11.5–15.5)
WBC: 9.9 10*3/uL (ref 4.0–10.5)

## 2013-11-25 MED ORDER — SODIUM CHLORIDE 0.9 % IV SOLN: INTRAVENOUS | Status: DC

## 2013-11-25 MED ORDER — CHLORHEXIDINE GLUCONATE 4 % EX LIQD: 60.0000 mL | Freq: Once | CUTANEOUS | Status: DC

## 2013-11-25 MED ORDER — CEFAZOLIN SODIUM-DEXTROSE 2-3 GM-% IV SOLR
2.0000 g | INTRAVENOUS | Status: AC
  Administered 2013-11-26: 2 g via INTRAVENOUS
  Filled 2013-11-25: qty 50

## 2013-11-25 NOTE — Progress Notes (Addendum)
Pt doesn't have a cardiologist  Denies ever having an echo/stress test/heart cath  EKG in epic from 08-12-13  Denies CXR in past yr  [facility] in [place] NP Raquel ray

## 2013-11-25 NOTE — Pre-Procedure Instructions (Signed)
[name]  11/25/2013   Your procedure is scheduled on:  Tues, Aug 18 @ 10:15 AM  Report to [facility]  at 8:15 AM.  Call this number if you have problems the morning of surgery: [phone]   Remember:   Do not eat food or drink liquids after midnight.   Take these medicines the morning of surgery with A SIP OF WATER: Atenolol-Chlorthalidone(Tenoretic)               Stop taking your Aspirin,Fish Oil,Vitamins,any Herbals. No Goody's,BC's,Aleve,and Ibuprofen   Do not wear jewelry, make-up or nail polish.  Do not wear lotions, powders, or perfumes. You may wear deodorant.  Do not shave 48 hours prior to surgery.   Do not bring valuables to the hospital.  [facility] is not responsible                  for any belongings or valuables.               Contacts, dentures or bridgework may not be worn into surgery.  Leave suitcase in the car. After surgery it may be brought to your room.  For patients admitted to the hospital, discharge time is determined by your                treatment team.               Patients discharged the day of surgery will not be allowed to drive  home.    Special Instructions:  [facility] - Preparing for Surgery  Before surgery, you can play an important role.  Because skin is not sterile, your skin needs to be as free of germs as possible.  You can reduce the number of germs on you skin by washing with CHG (chlorahexidine gluconate) soap before surgery.  CHG is an antiseptic cleaner which kills germs and bonds with the skin to continue killing germs even after washing.  Please DO NOT use if you have an allergy to CHG or antibacterial soaps.  If your skin becomes reddened/irritated stop using the CHG and inform your nurse when you arrive at Short Stay.  Do not shave (including legs and underarms) for at least 48 hours prior to the first CHG shower.  You may shave your face.  Please follow these instructions carefully:   1.  Shower with CHG  Soap the night before surgery and the                                morning of Surgery.  2.  If you choose to wash your hair, wash your hair first as usual with your       normal shampoo.  3.  After you shampoo, rinse your hair and body thoroughly to remove the                      Shampoo.  4.  Use CHG as you would any other liquid soap.  You can apply chg directly       to the skin and wash gently with scrungie or a clean washcloth.  5.  Apply the CHG Soap to your body ONLY FROM THE NECK DOWN.        Do not use on open wounds or open sores.  Avoid contact with your eyes,       ears, mouth and genitals (  private parts).  Wash genitals (private parts)       with your normal soap.  6.  Wash thoroughly, paying special attention to the area where your surgery        will be performed.  7.  Thoroughly rinse your body with warm water from the neck down.  8.  DO NOT shower/wash with your normal soap after using and rinsing off       the CHG Soap.  9.  Pat yourself dry with a clean towel.            10.  Wear clean pajamas.            11.  Place clean sheets on your bed the night of your first shower and do not        sleep with pets.  Day of Surgery  Do not apply any lotions/deoderants the morning of surgery.  Please wear clean clothes to the hospital/surgery center.     Please read over the following fact sheets that you were given: Pain Booklet, Coughing and Deep Breathing and Surgical Site Infection Prevention

## 2013-11-26 ENCOUNTER — Encounter (HOSPITAL_COMMUNITY): Payer: No Typology Code available for payment source | Admitting: Anesthesiology

## 2013-11-26 ENCOUNTER — Encounter (HOSPITAL_COMMUNITY)
Admission: RE | Disposition: A | Discharge status: Home / Self Care | Payer: Self-pay | Source: Ambulatory Visit | Attending: Orthopaedic Surgery

## 2013-11-26 ENCOUNTER — Ambulatory Visit (HOSPITAL_COMMUNITY): Payer: No Typology Code available for payment source | Admitting: Anesthesiology

## 2013-11-26 ENCOUNTER — Ambulatory Visit (HOSPITAL_COMMUNITY)
Admission: RE | Admit: 2013-11-26 | Discharge: 2013-11-26 | Disposition: A | Discharge status: Home / Self Care | Payer: No Typology Code available for payment source | Source: Ambulatory Visit | Attending: Orthopaedic Surgery | Admitting: Orthopaedic Surgery

## 2013-11-26 ENCOUNTER — Encounter (HOSPITAL_COMMUNITY): Payer: Self-pay | Admitting: Anesthesiology

## 2013-11-26 DIAGNOSIS — I872 Venous insufficiency (chronic) (peripheral): Secondary | ICD-10-CM | POA: Insufficient documentation

## 2013-11-26 DIAGNOSIS — M66329 Spontaneous rupture of flexor tendons, unspecified upper arm: Secondary | ICD-10-CM | POA: Diagnosis not present

## 2013-11-26 DIAGNOSIS — M6580 Other synovitis and tenosynovitis, unspecified site: Secondary | ICD-10-CM | POA: Insufficient documentation

## 2013-11-26 DIAGNOSIS — Z6834 Body mass index (BMI) 34.0-34.9, adult: Secondary | ICD-10-CM | POA: Insufficient documentation

## 2013-11-26 DIAGNOSIS — Z91013 Allergy to seafood: Secondary | ICD-10-CM | POA: Insufficient documentation

## 2013-11-26 DIAGNOSIS — Z91018 Allergy to other foods: Secondary | ICD-10-CM | POA: Diagnosis not present

## 2013-11-26 DIAGNOSIS — F411 Generalized anxiety disorder: Secondary | ICD-10-CM | POA: Diagnosis not present

## 2013-11-26 DIAGNOSIS — M722 Plantar fascial fibromatosis: Secondary | ICD-10-CM | POA: Diagnosis not present

## 2013-11-26 DIAGNOSIS — I10 Essential (primary) hypertension: Secondary | ICD-10-CM | POA: Diagnosis not present

## 2013-11-26 DIAGNOSIS — M25819 Other specified joint disorders, unspecified shoulder: Secondary | ICD-10-CM | POA: Insufficient documentation

## 2013-11-26 DIAGNOSIS — Z91012 Allergy to eggs: Secondary | ICD-10-CM | POA: Diagnosis not present

## 2013-11-26 DIAGNOSIS — S43429A Sprain of unspecified rotator cuff capsule, initial encounter: Secondary | ICD-10-CM | POA: Insufficient documentation

## 2013-11-26 DIAGNOSIS — Z888 Allergy status to other drugs, medicaments and biological substances status: Secondary | ICD-10-CM | POA: Diagnosis not present

## 2013-11-26 DIAGNOSIS — M25519 Pain in unspecified shoulder: Secondary | ICD-10-CM

## 2013-11-26 DIAGNOSIS — M7580 Other shoulder lesions, unspecified shoulder: Secondary | ICD-10-CM

## 2013-11-26 DIAGNOSIS — K219 Gastro-esophageal reflux disease without esophagitis: Secondary | ICD-10-CM | POA: Insufficient documentation

## 2013-11-26 DIAGNOSIS — M19019 Primary osteoarthritis, unspecified shoulder: Secondary | ICD-10-CM | POA: Diagnosis not present

## 2013-11-26 DIAGNOSIS — E663 Overweight: Secondary | ICD-10-CM | POA: Insufficient documentation

## 2013-11-26 DIAGNOSIS — E785 Hyperlipidemia, unspecified: Secondary | ICD-10-CM | POA: Diagnosis not present

## 2013-11-26 HISTORY — PX: SHOULDER ARTHROSCOPY WITH OPEN ROTATOR CUFF REPAIR AND DISTAL CLAVICLE ACROMINECTOMY: SHX5683

## 2013-11-26 SURGERY — SHOULDER ARTHROSCOPY WITH OPEN ROTATOR CUFF REPAIR AND DISTAL CLAVICLE ACROMINECTOMY
Anesthesia: General | Site: Shoulder | Laterality: Right

## 2013-11-26 MED ORDER — LIDOCAINE HCL (CARDIAC) 20 MG/ML IV SOLN
INTRAVENOUS | Status: DC | PRN
  Administered 2013-11-26: 100 mg via INTRAVENOUS

## 2013-11-26 MED ORDER — NEOSTIGMINE METHYLSULFATE 10 MG/10ML IV SOLN
INTRAVENOUS | Status: DC | PRN
  Administered 2013-11-26: 4 mg via INTRAVENOUS

## 2013-11-26 MED ORDER — DEXAMETHASONE SODIUM PHOSPHATE 4 MG/ML IJ SOLN
INTRAMUSCULAR | Status: AC
  Filled 2013-11-26: qty 1

## 2013-11-26 MED ORDER — SODIUM CHLORIDE 0.9 % IJ SOLN
INTRAMUSCULAR | Status: AC
  Filled 2013-11-26: qty 10

## 2013-11-26 MED ORDER — OXYCODONE-ACETAMINOPHEN 5-325 MG PO TABS
1.0000 | ORAL_TABLET | ORAL | Status: DC | PRN
  Administered 2013-11-26: 2 via ORAL

## 2013-11-26 MED ORDER — LACTATED RINGERS IV SOLN
INTRAVENOUS | Status: DC | PRN
  Administered 2013-11-26 (×2): via INTRAVENOUS

## 2013-11-26 MED ORDER — METHOCARBAMOL 500 MG PO TABS: 500.0000 mg | ORAL_TABLET | Freq: Four times a day (QID) | ORAL | Status: DC | PRN

## 2013-11-26 MED ORDER — FENTANYL CITRATE 0.05 MG/ML IJ SOLN
INTRAMUSCULAR | Status: AC
  Filled 2013-11-26: qty 5

## 2013-11-26 MED ORDER — OXYCODONE-ACETAMINOPHEN 5-325 MG PO TABS: 1.0000 | ORAL_TABLET | ORAL | Status: DC | PRN

## 2013-11-26 MED ORDER — GLYCOPYRROLATE 0.2 MG/ML IJ SOLN
INTRAMUSCULAR | Status: DC | PRN
  Administered 2013-11-26: 0.6 mg via INTRAVENOUS

## 2013-11-26 MED ORDER — BUPIVACAINE-EPINEPHRINE (PF) 0.25% -1:200000 IJ SOLN
INTRAMUSCULAR | Status: AC
  Filled 2013-11-26: qty 30

## 2013-11-26 MED ORDER — FENTANYL CITRATE 0.05 MG/ML IJ SOLN
100.0000 ug | Freq: Once | INTRAMUSCULAR | Status: AC
  Administered 2013-11-26: 100 ug via INTRAVENOUS

## 2013-11-26 MED ORDER — OXYCODONE-ACETAMINOPHEN 5-325 MG PO TABS
ORAL_TABLET | ORAL | Status: AC
Start: 2013-11-26 — End: 2013-11-26
  Administered 2013-11-26: 2 via ORAL
  Filled 2013-11-26: qty 2

## 2013-11-26 MED ORDER — EPHEDRINE SULFATE 50 MG/ML IJ SOLN
INTRAMUSCULAR | Status: AC
  Filled 2013-11-26: qty 1

## 2013-11-26 MED ORDER — ONDANSETRON HCL 4 MG/2ML IJ SOLN
INTRAMUSCULAR | Status: AC
  Filled 2013-11-26: qty 2

## 2013-11-26 MED ORDER — ROCURONIUM BROMIDE 100 MG/10ML IV SOLN
INTRAVENOUS | Status: DC | PRN
  Administered 2013-11-26: 50 mg via INTRAVENOUS

## 2013-11-26 MED ORDER — ARTIFICIAL TEARS OP OINT
TOPICAL_OINTMENT | OPHTHALMIC | Status: AC
  Filled 2013-11-26: qty 3.5

## 2013-11-26 MED ORDER — FENTANYL CITRATE 0.05 MG/ML IJ SOLN
INTRAMUSCULAR | Status: DC | PRN
  Administered 2013-11-26: 100 ug via INTRAVENOUS

## 2013-11-26 MED ORDER — NEOSTIGMINE METHYLSULFATE 10 MG/10ML IV SOLN
INTRAVENOUS | Status: AC
  Filled 2013-11-26: qty 1

## 2013-11-26 MED ORDER — METHOCARBAMOL 500 MG PO TABS
500.0000 mg | ORAL_TABLET | Freq: Four times a day (QID) | ORAL | Status: DC | PRN
  Administered 2013-11-26: 500 mg via ORAL
  Filled 2013-11-26: qty 1

## 2013-11-26 MED ORDER — EPHEDRINE SULFATE 50 MG/ML IJ SOLN
INTRAMUSCULAR | Status: DC | PRN
  Administered 2013-11-26 (×4): 5 mg via INTRAVENOUS
  Administered 2013-11-26: 10 mg via INTRAVENOUS
  Administered 2013-11-26 (×4): 5 mg via INTRAVENOUS

## 2013-11-26 MED ORDER — MIDAZOLAM HCL 2 MG/2ML IJ SOLN
INTRAMUSCULAR | Status: AC
  Administered 2013-11-26: 2 mg
  Filled 2013-11-26: qty 2

## 2013-11-26 MED ORDER — HYDROMORPHONE HCL PF 1 MG/ML IJ SOLN: 0.2500 mg | INTRAMUSCULAR | Status: DC | PRN

## 2013-11-26 MED ORDER — GLYCOPYRROLATE 0.2 MG/ML IJ SOLN
INTRAMUSCULAR | Status: AC
  Filled 2013-11-26: qty 2

## 2013-11-26 MED ORDER — METHOCARBAMOL 500 MG PO TABS
ORAL_TABLET | ORAL | Status: AC
  Administered 2013-11-26: 500 mg via ORAL
  Filled 2013-11-26: qty 1

## 2013-11-26 MED ORDER — MIDAZOLAM HCL 2 MG/2ML IJ SOLN: 2.0000 mg | Freq: Once | INTRAMUSCULAR | Status: DC

## 2013-11-26 MED ORDER — ONDANSETRON HCL 4 MG/2ML IJ SOLN
INTRAMUSCULAR | Status: DC | PRN
  Administered 2013-11-26: 4 mg via INTRAVENOUS

## 2013-11-26 MED ORDER — DEXTROSE 5 % IV SOLN
500.0000 mg | Freq: Four times a day (QID) | INTRAVENOUS | Status: DC | PRN
  Filled 2013-11-26: qty 5

## 2013-11-26 MED ORDER — ONDANSETRON HCL 4 MG/2ML IJ SOLN: 4.0000 mg | Freq: Once | INTRAMUSCULAR | Status: DC | PRN

## 2013-11-26 MED ORDER — SODIUM CHLORIDE 0.9 % IR SOLN
Status: DC | PRN
  Administered 2013-11-26: 6000 mL
  Administered 2013-11-26 (×2): 3000 mL
  Administered 2013-11-26: 1000 mL

## 2013-11-26 MED ORDER — DEXAMETHASONE SODIUM PHOSPHATE 10 MG/ML IJ SOLN
INTRAMUSCULAR | Status: AC
  Filled 2013-11-26: qty 1

## 2013-11-26 MED ORDER — FENTANYL CITRATE 0.05 MG/ML IJ SOLN
INTRAMUSCULAR | Status: AC
  Administered 2013-11-26: 100 ug via INTRAVENOUS
  Filled 2013-11-26: qty 2

## 2013-11-26 MED ORDER — OXYCODONE HCL 5 MG/5ML PO SOLN: 5.0000 mg | Freq: Once | ORAL | Status: DC | PRN

## 2013-11-26 MED ORDER — DEXAMETHASONE SODIUM PHOSPHATE 10 MG/ML IJ SOLN
INTRAMUSCULAR | Status: DC | PRN
  Administered 2013-11-26: 10 mg via INTRAVENOUS

## 2013-11-26 MED ORDER — PROPOFOL 10 MG/ML IV BOLUS
INTRAVENOUS | Status: AC
  Filled 2013-11-26: qty 20

## 2013-11-26 MED ORDER — MEPERIDINE HCL 25 MG/ML IJ SOLN: 6.2500 mg | INTRAMUSCULAR | Status: DC | PRN

## 2013-11-26 MED ORDER — LACTATED RINGERS IV SOLN
INTRAVENOUS | Status: DC
  Administered 2013-11-26: 09:00:00 via INTRAVENOUS

## 2013-11-26 MED ORDER — PHENYLEPHRINE HCL 10 MG/ML IJ SOLN
INTRAMUSCULAR | Status: AC
  Filled 2013-11-26: qty 1

## 2013-11-26 MED ORDER — ROCURONIUM BROMIDE 50 MG/5ML IV SOLN
INTRAVENOUS | Status: AC
  Filled 2013-11-26: qty 1

## 2013-11-26 MED ORDER — PROPOFOL 10 MG/ML IV BOLUS
INTRAVENOUS | Status: DC | PRN
  Administered 2013-11-26: 110 mg via INTRAVENOUS

## 2013-11-26 MED ORDER — OXYCODONE HCL 5 MG PO TABS: 5.0000 mg | ORAL_TABLET | Freq: Once | ORAL | Status: DC | PRN

## 2013-11-26 SURGICAL SUPPLY — 57 items
ANCHOR PEEK ALL THREAD (Anchor) ×4 IMPLANT
APL SKNCLS STERI-STRIP NONHPOA (GAUZE/BANDAGES/DRESSINGS) ×1
BENZOIN TINCTURE PRP APPL 2/3 (GAUZE/BANDAGES/DRESSINGS) ×3 IMPLANT
BLADE GREAT WHITE 4.2 (BLADE) ×2 IMPLANT
BLADE GREAT WHITE 4.2MM (BLADE) ×1
BLADE SURG 11 STRL SS (BLADE) ×3 IMPLANT
BUR OVAL 6.0 (BURR) ×3 IMPLANT
CANNULA ACUFLEX KIT 5X76 (CANNULA) ×3 IMPLANT
CLOSURE STERI-STRIP 1/4X4 (GAUZE/BANDAGES/DRESSINGS) ×2 IMPLANT
CLOSURE WOUND 1/2 X4 (GAUZE/BANDAGES/DRESSINGS) ×1
COVER SURGICAL LIGHT HANDLE (MISCELLANEOUS) ×3 IMPLANT
DRAPE SHOULDER BEACH CHAIR (DRAPES) ×3 IMPLANT
DRSG PAD ABDOMINAL 8X10 ST (GAUZE/BANDAGES/DRESSINGS) ×3 IMPLANT
DURAPREP 26ML APPLICATOR (WOUND CARE) ×3 IMPLANT
ELECT NDL TIP 2.8 STRL (NEEDLE) IMPLANT
ELECT NEEDLE TIP 2.8 STRL (NEEDLE) ×3 IMPLANT
ELECT REM PT RETURN 9FT ADLT (ELECTROSURGICAL) ×3
ELECTRODE REM PT RTRN 9FT ADLT (ELECTROSURGICAL) ×1 IMPLANT
GAUZE SPONGE 4X4 12PLY STRL (GAUZE/BANDAGES/DRESSINGS) ×3 IMPLANT
GLOVE BIOGEL PI IND STRL 6.5 (GLOVE) IMPLANT
GLOVE BIOGEL PI IND STRL 8 (GLOVE) ×1 IMPLANT
GLOVE BIOGEL PI IND STRL 8.5 (GLOVE) IMPLANT
GLOVE BIOGEL PI INDICATOR 6.5 (GLOVE) ×2
GLOVE BIOGEL PI INDICATOR 8 (GLOVE) ×2
GLOVE BIOGEL PI INDICATOR 8.5 (GLOVE)
GLOVE ECLIPSE 8.0 STRL XLNG CF (GLOVE) ×3 IMPLANT
GLOVE ORTHOPEDIC STR SZ6.5 (GLOVE) ×2 IMPLANT
GLOVE SURG ORTHO 8.5 STRL (GLOVE) ×6 IMPLANT
GOWN STRL REUS W/ TWL LRG LVL3 (GOWN DISPOSABLE) ×2 IMPLANT
GOWN STRL REUS W/TWL 2XL LVL3 (GOWN DISPOSABLE) ×3 IMPLANT
GOWN STRL REUS W/TWL LRG LVL3 (GOWN DISPOSABLE) ×6
KIT ROOM TURNOVER OR (KITS) ×3 IMPLANT
MANIFOLD NEPTUNE II (INSTRUMENTS) ×3 IMPLANT
NDL SPNL 18GX3.5 QUINCKE PK (NEEDLE) ×1 IMPLANT
NEEDLE 22X1 1/2 (OR ONLY) (NEEDLE) IMPLANT
NEEDLE SPNL 18GX3.5 QUINCKE PK (NEEDLE) ×3 IMPLANT
NS IRRIG 1000ML POUR BTL (IV SOLUTION) ×3 IMPLANT
PACK SHOULDER (CUSTOM PROCEDURE TRAY) ×3 IMPLANT
PAD ARMBOARD 7.5X6 YLW CONV (MISCELLANEOUS) ×6 IMPLANT
SET ARTHROSCOPY TUBING (MISCELLANEOUS) ×3
SET ARTHROSCOPY TUBING LN (MISCELLANEOUS) ×1 IMPLANT
SPONGE GAUZE 4X4 12PLY STER LF (GAUZE/BANDAGES/DRESSINGS) ×2 IMPLANT
SPONGE LAP 4X18 X RAY DECT (DISPOSABLE) ×2 IMPLANT
STRIP CLOSURE SKIN 1/2X4 (GAUZE/BANDAGES/DRESSINGS) ×2 IMPLANT
SUT ETHILON 4 0 PS 2 18 (SUTURE) IMPLANT
SUT MNCRL AB 3-0 PS2 18 (SUTURE) ×3 IMPLANT
SUT VIC AB 0 CT1 27 (SUTURE) ×3
SUT VIC AB 0 CT1 27XBRD ANBCTR (SUTURE) ×1 IMPLANT
SUT VICRYL 0 UR6 27IN ABS (SUTURE) ×3 IMPLANT
SYR CONTROL 10ML LL (SYRINGE) IMPLANT
TAPE CLOTH SURG 6X10 WHT LF (GAUZE/BANDAGES/DRESSINGS) ×3 IMPLANT
TOWEL OR 17X24 6PK STRL BLUE (TOWEL DISPOSABLE) ×3 IMPLANT
TOWEL OR 17X26 10 PK STRL BLUE (TOWEL DISPOSABLE) ×3 IMPLANT
TUBE CONNECTING 12'X1/4 (SUCTIONS) ×2
TUBE CONNECTING 12X1/4 (SUCTIONS) ×4 IMPLANT
WAND HAND CNTRL MULTIVAC 90 (MISCELLANEOUS) ×3 IMPLANT
WATER STERILE IRR 1000ML POUR (IV SOLUTION) ×3 IMPLANT

## 2013-11-26 NOTE — Discharge Instructions (Signed)
Wear sling at all times.  Ice packs to shoulder daily or as needed for pain and swelling. Keep incision dry, clean and covered.  What to eat:  For your first meals, you should eat lightly; only small meals initially.  If you do not have nausea, you may eat larger meals.  Avoid spicy, greasy and heavy food.    General Anesthesia, Adult, Care After  Refer to this sheet in the next few weeks. These instructions provide you with information on caring for yourself after your procedure. Your health care provider may also give you more specific instructions. Your treatment has been planned according to current medical practices, but problems sometimes occur. Call your health care provider if you have any problems or questions after your procedure.  WHAT TO EXPECT AFTER THE PROCEDURE  After the procedure, it is typical to experience:  Sleepiness.  Nausea and vomiting. HOME CARE INSTRUCTIONS  For the first 24 hours after general anesthesia:  Have a responsible person with you.  Do not drive a car. If you are alone, do not take public transportation.  Do not drink alcohol.  Do not take medicine that has not been prescribed by your health care provider.  Do not sign important papers or make important decisions.  You may resume a normal diet and activities as directed by your health care provider.  Change bandages (dressings) as directed.  If you have questions or problems that seem related to general anesthesia, call the hospital and ask for the anesthetist or anesthesiologist on call. SEEK MEDICAL CARE IF:  You have nausea and vomiting that continue the day after anesthesia.  You develop a rash. SEEK IMMEDIATE MEDICAL CARE IF:  You have difficulty breathing.  You have chest pain.  You have any allergic problems. Document Released: 07/04/2000 Document Revised: 11/28/2012 Document Reviewed: 10/11/2012  [facility] Patient Information 2014 [place], [place].

## 2013-11-26 NOTE — Transfer of Care (Signed)
Immediate Anesthesia Transfer of Care Note  Patient: [name]  Procedure(s) Performed: Procedure(s): RIGHT SHOULDER ARTHROSCOPY WITH MINI OPEN ROTATOR CUFF REPAIR AND DISTAL CLAVICLE RESECTION, SUBACROMIAL DECOMPRESSION, POSSIBLE DERMASPAN PATCH. (Right)  Patient Location: PACU  Anesthesia Type:GA combined with regional for post-op pain  Level of Consciousness: awake, alert  and oriented  Airway & Oxygen Therapy: Patient Spontanous Breathing and Patient connected to face mask oxygen  Post-op Assessment: Report given to PACU RN  Post vital signs: Reviewed and stable  Complications: No apparent anesthesia complications

## 2013-11-26 NOTE — Anesthesia Procedure Notes (Addendum)
Anesthesia Regional Block:  Interscalene brachial plexus block  Pre-Anesthetic Checklist: ,, timeout performed, Correct Patient, Correct Site, Correct Laterality, Correct Procedure, Correct Position, site marked, Risks and benefits discussed,  Surgical consent,  Pre-op evaluation,  At surgeon's request and post-op pain management  Laterality: Right  Prep: chloraprep       Needles:  Injection technique: Single-shot  Needle Type: Echogenic Stimulator Needle     Needle Length: 9cm 9 cm Needle Gauge: 21 and 21 G    Additional Needles:  Procedures: ultrasound guided (picture in chart) and nerve stimulator Interscalene brachial plexus block  Nerve Stimulator or Paresthesia:  Response: 0.4 mA,   Additional Responses:   Narrative:  Start time: 11/26/2013 8:50 AM End time: 11/26/2013 9:00 AM Injection made incrementally with aspirations every 5 mL.  Performed by: Personally  Anesthesiologist: Lillia Abed MD  Additional Notes: Monitors applied. Patient sedated. Sterile prep and drape,hand hygiene and sterile gloves were used. Relevant anatomy identified.Needle position confirmed.Local anesthetic injected incrementally after negative aspiration. Local anesthetic spread visualized around nerve(s). Vascular puncture avoided. No complications. Image printed for medical record.The patient tolerated the procedure well.

## 2013-11-26 NOTE — Anesthesia Postprocedure Evaluation (Signed)
Anesthesia Post Note  Patient: [name]  Procedure(s) Performed: Procedure(s) (LRB): RIGHT SHOULDER ARTHROSCOPY WITH MINI OPEN ROTATOR CUFF REPAIR AND DISTAL CLAVICLE RESECTION, SUBACROMIAL DECOMPRESSION, POSSIBLE DERMASPAN PATCH. (Right)  Anesthesia type: general  Patient location: PACU  Post pain: Pain level controlled  Post assessment: Patient's Cardiovascular Status Stable  Last Vitals:  Filed Vitals:   11/26/13 1233  BP: 129/73  Pulse:   Temp: 36.4 C  Resp:     Post vital signs: Reviewed and stable  Level of consciousness: sedated  Complications: No apparent anesthesia complications

## 2013-11-26 NOTE — Anesthesia Preprocedure Evaluation (Signed)
Anesthesia Evaluation  Patient identified by MRN, date of birth, ID band Patient awake    History of Anesthesia Complications (+) PONV  Airway Mallampati: I TM Distance: >3 FB Neck ROM: Full    Dental   Pulmonary          Cardiovascular hypertension, Pt. on medications     Neuro/Psych    GI/Hepatic GERD-  Controlled,  Endo/Other    Renal/GU      Musculoskeletal   Abdominal   Peds  Hematology   Anesthesia Other Findings   Reproductive/Obstetrics                           Anesthesia Physical Anesthesia Plan  ASA: II  Anesthesia Plan: General   Post-op Pain Management:    Induction: Intravenous  Airway Management Planned: Oral ETT  Additional Equipment:   Intra-op Plan:   Post-operative Plan: Extubation in OR  Informed Consent: I have reviewed the patients History and Physical, chart, labs and discussed the procedure including the risks, benefits and alternatives for the proposed anesthesia with the patient or authorized representative who has indicated his/her understanding and acceptance.     Plan Discussed with: CRNA and Surgeon  Anesthesia Plan Comments:         Anesthesia Quick Evaluation

## 2013-11-26 NOTE — H&P (Signed)
  The recent History & Physical has been reviewed. I have personally examined the patient today. There is no interval change to the documented History & Physical. The patient would like to proceed with the procedure.  Joni Fears W 11/26/2013,  10:05 AM

## 2013-11-26 NOTE — Op Note (Signed)
PATIENT ID:      ODALIZ MCQUEARY  MRN:     [id number] DOB/AGE:    1950/07/24 / 64 y.o.       OPERATIVE REPORT    DATE OF PROCEDURE:  11/26/2013       PREOPERATIVE DIAGNOSIS:   RIGHT SHOULDER ROTATOR CUFF TEAR WITH IMPINGEMENT, DJD A-C JOINT                                                       Estimated body mass index is 34.02 kg/(m^2) as calculated from the following:   Height as of this encounter: 5' 3"  (1.6 m).   Weight as of this encounter: 87.091 kg (192 lb).     POSTOPERATIVE DIAGNOSIS:   RIGHT SHOULDER ROTATOR CUFF TEAR-SAME                                                                     Estimated body mass index is 34.02 kg/(m^2) as calculated from the following:   Height as of this encounter: 5' 3"  (1.6 m).   Weight as of this encounter: 87.091 kg (192 lb).     PROCEDURE:  Procedure(s): RIGHT SHOULDER ARTHROSCOPY WITH MINI OPEN ROTATOR CUFF REPAIR AND DISTAL CLAVICLE RESECTION, SUBACROMIAL DECOMPRESSION,     SURGEON:  Joni Fears, MD    ASSISTANT:   April, scub nurse          ANESTHESIA: regional and general     DRAINS: none :      TOURNIQUET TIME: * No tourniquets in log *    COMPLICATIONS:  None   CONDITION:  stable  PROCEDURE IN [id number]   Durward Fortes, PETER W 11/26/2013, 12:10 PM

## 2013-11-26 NOTE — Op Note (Signed)
[name], [name]              ACCOUNT NO.:  [id number]  MEDICAL RECORD NO.:  [id number]  LOCATION:  [facility]                         FACILITY:  [facility]  PHYSICIAN:  Vonna Kotyk. Seleena Reimers, M.D.DATE OF BIRTH:  1951-03-06  DATE OF PROCEDURE:  11/26/2013 DATE OF DISCHARGE:  11/26/2013                              OPERATIVE REPORT   PREOPERATIVE DIAGNOSES: 1. Massive rotator cuff tear right shoulder with impingement. 2. Degenerative joint disease, acromioclavicular joint.  POSTOPERATIVE DIAGNOSES: 1. Massive rotator cuff tear right shoulder with impingement. 2. Degenerative joint disease, acromioclavicular joint. 3. Tear of biceps tendon.  PROCEDURE: 1. Diagnostic arthroscopy, right shoulder with debridement of     glenohumeral joint including synovitis and frayed biceps tendon     stump. 2. Arthroscopic subacromial decompression. 3. Arthroscopic distal clavicle resection. 4. Mini open rotator cuff tear repair.  SURGEON:  Vonna Kotyk. Durward Fortes, M.D.  ASSISTANT:  Scrub nurse.  ANESTHESIA:  General supplemental interscalene nerve block.  COMPLICATIONS:  None.  DESCRIPTION OF PROCEDURE:  [name] was met with the family in the holding area, identified the right shoulder.  Appropriate operative site and marked it accordingly.  She did receive preoperative interscalene nerve block for anesthesia.  The patient was then transported to room #7, placed under general anesthesia without difficulty and then placed in a semi-sitting position with the shoulder frame.  Time-out was called.  Examination of right shoulder was then performed without evidence of adhesive capsulitis or instability. Right shoulder was then prepped with DuraPrep in the base of the neck circumferentially below the elbow.  Sterile draping was performed.  Marking pen was used to outline the [facility] joint, the coracoid. And the acromion.  At a point, fingerbreadth posterior medial to the posterior angle acromion, a  small stab wound was made.  Arthroscope was then easily placed into the shoulder joint.  Diagnostic arthroscopy revealed diffuse synovitis.  There was an obvious full-thickness tear of the rotator cuff, so I can easily visualize the subacromial space.  I did not visualize the biceps tendon.  The second portal was established anteriorly with the Rigg cannula.  I performed a synovectomy and debrided from frayed labrum as well as the stump of the biceps tendon.  The arthroscope was then placed in the subacromial space posteriorly. The cannula, subacromial space, anterior and third portal was established in the lateral subacromial space.  An arthroscopic subacromial decompression was performed.  There was abundant amount of inflammatory bursal tissue.  There was obvious anterior and lateral overhang of the acromion.  I performed an anterior and lateral acromioplasty with a 6 mm bur with a nice flat resection.  I could visualize the rotator cuff tear through the subacromial space.  The distal clavicle was arthritic.  There was synovial hypertrophy an inflammatory change about the joint.  I performed a distal clavicle resection with the same 6 mm bur with a nice flat resection.  There was a moderate amount of bleeding probably based on the patient taking aspirin.  Mini open rotator cuff tear was then performed.  About an inch and half incision was made along the anterior aspect of the shoulder via sharp dissection of the subcutaneous tissue.  Small bleeders  with Bovie coagulated.  Self-retaining retractor was inserted.  The raphe within the deltoid fascia was identified and incised.  The subacromial space was entered.  The retractor was placed more deeply.  The rotator cuff tear involved the dome of the supraspinatus with some fibers of the infraspinatus was identified.  I was able to see the edges.  I sharply debrided them and I was able to advanced into its anatomic position of the  humeral head.  Bleeding bone was established and I used 2 5.0, Biomet peek anchors with attached FiberWire to repair the cuff, had a nice placement of the of the cuff without retraction and there was no evidence of impingement.  The wound was then irrigated with saline solution.  The deltoid fascia closed with running 0 Vicryl, subcu with Monocryl, and skin closed with Steri-Strips over benzoin.  Sterile bulky dressing was applied followed by a sling.  PLAN:  Outpatient, the patient did well without any problems.  I will plan to see her in the office within the week.  She will have oxycodone and Robaxin for pain.     Vonna Kotyk. Durward Fortes, M.D.     PWW/MEDQ  D:  11/26/2013  T:  11/26/2013  Job:  [id number]

## 2013-11-27 ENCOUNTER — Encounter (HOSPITAL_COMMUNITY): Payer: Self-pay | Admitting: Orthopaedic Surgery

## 2013-12-27 ENCOUNTER — Other Ambulatory Visit: Payer: Self-pay | Admitting: *Deleted

## 2013-12-27 ENCOUNTER — Other Ambulatory Visit: Payer: Self-pay | Admitting: Pulmonary Disease

## 2013-12-27 MED ORDER — POTASSIUM CHLORIDE CRYS ER 20 MEQ PO TBCR: 20.0000 meq | EXTENDED_RELEASE_TABLET | Freq: Two times a day (BID) | ORAL | Status: DC

## 2013-12-27 MED ORDER — SIMVASTATIN 20 MG PO TABS: 20.0000 mg | ORAL_TABLET | Freq: Every day | ORAL | Status: DC

## 2013-12-27 MED ORDER — LISINOPRIL 2.5 MG PO TABS: 2.5000 mg | ORAL_TABLET | Freq: Every day | ORAL | Status: DC

## 2013-12-27 MED ORDER — ATENOLOL-CHLORTHALIDONE 50-25 MG PO TABS: ORAL_TABLET | ORAL | Status: DC

## 2014-01-09 DIAGNOSIS — M8580 Other specified disorders of bone density and structure, unspecified site: Secondary | ICD-10-CM

## 2014-01-09 HISTORY — DX: Other specified disorders of bone density and structure, unspecified site: M85.80

## 2014-01-28 ENCOUNTER — Encounter: Payer: Self-pay | Admitting: Gynecology

## 2014-01-28 ENCOUNTER — Ambulatory Visit (INDEPENDENT_AMBULATORY_CARE_PROVIDER_SITE_OTHER): Payer: 59

## 2014-01-28 DIAGNOSIS — M8580 Other specified disorders of bone density and structure, unspecified site: Secondary | ICD-10-CM

## 2014-02-10 ENCOUNTER — Encounter: Payer: Self-pay | Admitting: Gynecology

## 2014-06-03 ENCOUNTER — Encounter: Payer: Self-pay | Admitting: Internal Medicine

## 2014-06-03 ENCOUNTER — Ambulatory Visit (INDEPENDENT_AMBULATORY_CARE_PROVIDER_SITE_OTHER): Payer: No Typology Code available for payment source | Admitting: Internal Medicine

## 2014-06-03 VITALS — BP 126/88 | HR 81 | Temp 97.5°F | Ht 62.0 in | Wt 188.0 lb

## 2014-06-03 DIAGNOSIS — E663 Overweight: Secondary | ICD-10-CM

## 2014-06-03 DIAGNOSIS — I10 Essential (primary) hypertension: Secondary | ICD-10-CM

## 2014-06-03 DIAGNOSIS — Z0000 Encounter for general adult medical examination without abnormal findings: Secondary | ICD-10-CM

## 2014-06-03 DIAGNOSIS — R7301 Impaired fasting glucose: Secondary | ICD-10-CM

## 2014-06-03 DIAGNOSIS — E785 Hyperlipidemia, unspecified: Secondary | ICD-10-CM

## 2014-06-03 NOTE — Patient Instructions (Signed)
We are not going to check your blood work today. Your blood pressure is doing well.   Keep working on exercising about 3 times per week for 30 minutes each time to keep yourself in good health.   Think about getting the shingles shot before you turn 65 as medicare will not pay for it at all if you want to get it.   Exercise to Stay Healthy Exercise helps you become and stay healthy. EXERCISE IDEAS AND TIPS Choose exercises that:  You enjoy.  Fit into your day. You do not need to exercise really hard to be healthy. You can do exercises at a slow or medium level and stay healthy. You can:  Stretch before and after working out.  Try yoga, Pilates, or tai chi.  Lift weights.  Walk fast, swim, jog, run, climb stairs, bicycle, dance, or rollerskate.  Take aerobic classes. Exercises that burn about 150 calories:  Running 1  miles in 15 minutes.  Playing volleyball for 45 to 60 minutes.  Washing and waxing a car for 45 to 60 minutes.  Playing touch football for 45 minutes.  Walking 1  miles in 35 minutes.  Pushing a stroller 1  miles in 30 minutes.  Playing basketball for 30 minutes.  Raking leaves for 30 minutes.  Bicycling 5 miles in 30 minutes.  Walking 2 miles in 30 minutes.  Dancing for 30 minutes.  Shoveling snow for 15 minutes.  Swimming laps for 20 minutes.  Walking up stairs for 15 minutes.  Bicycling 4 miles in 15 minutes.  Gardening for 30 to 45 minutes.  Jumping rope for 15 minutes.  Washing windows or floors for 45 to 60 minutes. Document Released: 04/30/2010 Document Revised: 06/20/2011 Document Reviewed: 04/30/2010 [facility] Patient Information 2015 [place], [place]. This information is not intended to replace advice given to you by your health care provider. Make sure you discuss any questions you have with your health care provider.

## 2014-06-03 NOTE — Progress Notes (Signed)
Pre visit review using our clinic review tool, if applicable. No additional management support is needed unless otherwise documented below in the visit note. 

## 2014-06-05 ENCOUNTER — Encounter: Payer: Self-pay | Admitting: Internal Medicine

## 2014-06-05 DIAGNOSIS — Z0000 Encounter for general adult medical examination without abnormal findings: Secondary | ICD-10-CM | POA: Insufficient documentation

## 2014-06-05 NOTE — Assessment & Plan Note (Signed)
BP well controlled on amlodipine, chlorthalidone, lisinopril. Room to increase if needed in the future. Check BMP.

## 2014-06-05 NOTE — Assessment & Plan Note (Signed)
Last lipid panel at goal and continue simvastatin. No side effects.

## 2014-06-05 NOTE — Assessment & Plan Note (Signed)
Talked to her about shingles shot today and she will consider it. Declines flu shot. Other immunizations up to date. Colonoscopy and mammogram up to date.

## 2014-06-05 NOTE — Progress Notes (Signed)
   Subjective:    Patient ID: [name], female    DOB: 10/24/1950, 64 y.o.   MRN: [id number]  HPI The patient is a 64 YO female who is coming in for wellness. She has no complaints today. She is doing well with her medicines. She has been working on exercising about 3-4 times per week doing exercises at home.   PMH, [facility], social history, allergies, medications reviewed and updated.   Review of Systems  Constitutional: Negative for fever, activity change, appetite change, fatigue and unexpected weight change.  HENT: Negative.   Eyes: Negative.   Respiratory: Negative for cough, chest tightness, shortness of breath and wheezing.   Cardiovascular: Negative for chest pain, palpitations and leg swelling.  Gastrointestinal: Negative for nausea, abdominal pain, diarrhea, constipation and abdominal distention.  Musculoskeletal: Positive for arthralgias. Negative for myalgias and gait problem.  Skin: Negative.   Neurological: Negative.   Psychiatric/Behavioral: Negative.       Objective:   Physical Exam  Constitutional: She is oriented to person, place, and time. She appears well-developed and well-nourished.  HENT:  Head: Normocephalic and atraumatic.  Eyes: EOM are normal.  Neck: Normal range of motion.  Cardiovascular: Normal rate and regular rhythm.   Pulmonary/Chest: Effort normal and breath sounds normal. No respiratory distress. She has no wheezes. She has no rales.  Abdominal: Soft. Bowel sounds are normal. She exhibits no distension. There is no tenderness.  Musculoskeletal: She exhibits no edema.  Neurological: She is alert and oriented to person, place, and time. Coordination normal.  Skin: Skin is warm and dry.  Psychiatric: She has a normal mood and affect.   Filed Vitals:   06/03/14 1421  BP: 126/88  Pulse: 81  Temp: 97.5 F (36.4 C)  TempSrc: Oral  Height: 5' 2"  (1.575 m)  Weight: 188 lb (85.276 kg)  SpO2: 97%      Assessment & Plan:

## 2014-06-05 NOTE — Assessment & Plan Note (Signed)
Her diet is not great but she does try to exercise most days. Talked to her about losing a little bit of weight and she will try.

## 2014-06-05 NOTE — Assessment & Plan Note (Signed)
Check HgA1c today.

## 2014-09-10 ENCOUNTER — Encounter (HOSPITAL_COMMUNITY): Payer: Self-pay | Admitting: *Deleted

## 2014-09-10 ENCOUNTER — Inpatient Hospital Stay (HOSPITAL_COMMUNITY)
Admission: EM | Admit: 2014-09-10 | Discharge: 2014-09-13 | DRG: 392 | Disposition: A | Discharge status: Home / Self Care | Payer: No Typology Code available for payment source | Attending: Internal Medicine | Admitting: Internal Medicine

## 2014-09-10 ENCOUNTER — Other Ambulatory Visit (HOSPITAL_COMMUNITY): Payer: Self-pay

## 2014-09-10 ENCOUNTER — Telehealth: Payer: Self-pay | Admitting: Internal Medicine

## 2014-09-10 DIAGNOSIS — A09 Infectious gastroenteritis and colitis, unspecified: Secondary | ICD-10-CM | POA: Diagnosis not present

## 2014-09-10 DIAGNOSIS — Z79899 Other long term (current) drug therapy: Secondary | ICD-10-CM

## 2014-09-10 DIAGNOSIS — K921 Melena: Secondary | ICD-10-CM | POA: Diagnosis present

## 2014-09-10 DIAGNOSIS — K529 Noninfective gastroenteritis and colitis, unspecified: Secondary | ICD-10-CM | POA: Diagnosis present

## 2014-09-10 DIAGNOSIS — R935 Abnormal findings on diagnostic imaging of other abdominal regions, including retroperitoneum: Secondary | ICD-10-CM | POA: Diagnosis present

## 2014-09-10 DIAGNOSIS — G8929 Other chronic pain: Secondary | ICD-10-CM | POA: Diagnosis present

## 2014-09-10 DIAGNOSIS — Z7722 Contact with and (suspected) exposure to environmental tobacco smoke (acute) (chronic): Secondary | ICD-10-CM | POA: Diagnosis present

## 2014-09-10 DIAGNOSIS — Z86010 Personal history of colonic polyps: Secondary | ICD-10-CM

## 2014-09-10 DIAGNOSIS — M549 Dorsalgia, unspecified: Secondary | ICD-10-CM | POA: Diagnosis present

## 2014-09-10 DIAGNOSIS — Z7982 Long term (current) use of aspirin: Secondary | ICD-10-CM | POA: Diagnosis not present

## 2014-09-10 DIAGNOSIS — I10 Essential (primary) hypertension: Secondary | ICD-10-CM | POA: Diagnosis present

## 2014-09-10 DIAGNOSIS — K50111 Crohn's disease of large intestine with rectal bleeding: Secondary | ICD-10-CM | POA: Diagnosis present

## 2014-09-10 DIAGNOSIS — K5010 Crohn's disease of large intestine without complications: Secondary | ICD-10-CM | POA: Diagnosis present

## 2014-09-10 DIAGNOSIS — E876 Hypokalemia: Secondary | ICD-10-CM | POA: Diagnosis present

## 2014-09-10 DIAGNOSIS — K210 Gastro-esophageal reflux disease with esophagitis: Secondary | ICD-10-CM

## 2014-09-10 DIAGNOSIS — K922 Gastrointestinal hemorrhage, unspecified: Secondary | ICD-10-CM | POA: Diagnosis not present

## 2014-09-10 DIAGNOSIS — E785 Hyperlipidemia, unspecified: Secondary | ICD-10-CM | POA: Diagnosis present

## 2014-09-10 DIAGNOSIS — K625 Hemorrhage of anus and rectum: Secondary | ICD-10-CM

## 2014-09-10 DIAGNOSIS — Z8673 Personal history of transient ischemic attack (TIA), and cerebral infarction without residual deficits: Secondary | ICD-10-CM | POA: Diagnosis not present

## 2014-09-10 DIAGNOSIS — K219 Gastro-esophageal reflux disease without esophagitis: Secondary | ICD-10-CM | POA: Diagnosis present

## 2014-09-10 DIAGNOSIS — R197 Diarrhea, unspecified: Secondary | ICD-10-CM | POA: Diagnosis present

## 2014-09-10 DIAGNOSIS — K2961 Other gastritis with bleeding: Secondary | ICD-10-CM

## 2014-09-10 DIAGNOSIS — M1990 Unspecified osteoarthritis, unspecified site: Secondary | ICD-10-CM | POA: Diagnosis present

## 2014-09-10 DIAGNOSIS — R112 Nausea with vomiting, unspecified: Secondary | ICD-10-CM | POA: Diagnosis present

## 2014-09-10 HISTORY — DX: Urge incontinence: N39.41

## 2014-09-10 LAB — CBC
HCT: 40.8 % (ref 36.0–46.0)
Hemoglobin: 13.6 g/dL (ref 12.0–15.0)
MCH: 29.6 pg (ref 26.0–34.0)
MCHC: 33.3 g/dL (ref 30.0–36.0)
MCV: 88.9 fL (ref 78.0–100.0)
PLATELETS: 206 10*3/uL (ref 150–400)
RBC: 4.59 MIL/uL (ref 3.87–5.11)
RDW: 13.8 % (ref 11.5–15.5)
WBC: 14.2 10*3/uL — AB (ref 4.0–10.5)

## 2014-09-10 LAB — COMPREHENSIVE METABOLIC PANEL
ALK PHOS: 52 U/L (ref 38–126)
ALT: 36 U/L (ref 14–54)
ANION GAP: 12 (ref 5–15)
AST: 47 U/L — AB (ref 15–41)
Albumin: 4 g/dL (ref 3.5–5.0)
BILIRUBIN TOTAL: 0.6 mg/dL (ref 0.3–1.2)
BUN: 14 mg/dL (ref 6–20)
CALCIUM: 9.6 mg/dL (ref 8.9–10.3)
CHLORIDE: 103 mmol/L (ref 101–111)
CO2: 23 mmol/L (ref 22–32)
CREATININE: 1.11 mg/dL — AB (ref 0.44–1.00)
GFR calc non Af Amer: 51 mL/min — ABNORMAL LOW (ref >60)
GFR, EST AFRICAN AMERICAN: 59 mL/min — AB (ref >60)
GLUCOSE: 109 mg/dL — AB (ref 65–99)
POTASSIUM: 3.8 mmol/L (ref 3.5–5.1)
Sodium: 138 mmol/L (ref 135–145)
Total Protein: 7.2 g/dL (ref 6.5–8.1)

## 2014-09-10 LAB — TYPE AND SCREEN
ABO/RH(D): A NEG
ANTIBODY SCREEN: NEGATIVE

## 2014-09-10 LAB — PROTIME-INR
INR: 1.1 (ref 0.00–1.49)
Prothrombin Time: 14.4 seconds (ref 11.6–15.2)

## 2014-09-10 LAB — CBC WITH DIFFERENTIAL/PLATELET
BASOS ABS: 0 10*3/uL (ref 0.0–0.1)
BASOS PCT: 0 % (ref 0–1)
Eosinophils Absolute: 0.4 10*3/uL (ref 0.0–0.7)
Eosinophils Relative: 3 % (ref 0–5)
HCT: 44.6 % (ref 36.0–46.0)
HEMOGLOBIN: 15.1 g/dL — AB (ref 12.0–15.0)
LYMPHS PCT: 17 % (ref 12–46)
Lymphs Abs: 2.4 10*3/uL (ref 0.7–4.0)
MCH: 30.2 pg (ref 26.0–34.0)
MCHC: 33.9 g/dL (ref 30.0–36.0)
MCV: 89.2 fL (ref 78.0–100.0)
MONOS PCT: 5 % (ref 3–12)
Monocytes Absolute: 0.7 10*3/uL (ref 0.1–1.0)
Neutro Abs: 10.2 10*3/uL — ABNORMAL HIGH (ref 1.7–7.7)
Neutrophils Relative %: 75 % (ref 43–77)
PLATELETS: 229 10*3/uL (ref 150–400)
RBC: 5 MIL/uL (ref 3.87–5.11)
RDW: 13.9 % (ref 11.5–15.5)
WBC: 13.6 10*3/uL — ABNORMAL HIGH (ref 4.0–10.5)

## 2014-09-10 LAB — LIPASE, BLOOD: LIPASE: 27 U/L (ref 22–51)

## 2014-09-10 LAB — APTT: aPTT: 28 seconds (ref 24–37)

## 2014-09-10 LAB — POC OCCULT BLOOD, ED: FECAL OCCULT BLD: POSITIVE — AB

## 2014-09-10 LAB — ABO/RH: ABO/RH(D): A NEG

## 2014-09-10 MED ORDER — OXYCODONE HCL 5 MG PO TABS
5.0000 mg | ORAL_TABLET | ORAL | Status: DC | PRN
  Administered 2014-09-11 – 2014-09-13 (×3): 5 mg via ORAL
  Filled 2014-09-10 (×3): qty 1

## 2014-09-10 MED ORDER — SODIUM CHLORIDE 0.9 % IV BOLUS (SEPSIS)
1000.0000 mL | Freq: Once | INTRAVENOUS | Status: AC
  Administered 2014-09-10: 1000 mL via INTRAVENOUS

## 2014-09-10 MED ORDER — METHOCARBAMOL 500 MG PO TABS
500.0000 mg | ORAL_TABLET | Freq: Four times a day (QID) | ORAL | Status: DC | PRN
  Filled 2014-09-10: qty 1

## 2014-09-10 MED ORDER — ADULT MULTIVITAMIN W/MINERALS CH
1.0000 | ORAL_TABLET | Freq: Every day | ORAL | Status: DC
  Administered 2014-09-11 – 2014-09-13 (×3): 1 via ORAL
  Filled 2014-09-10 (×3): qty 1

## 2014-09-10 MED ORDER — ATENOLOL 50 MG PO TABS
50.0000 mg | ORAL_TABLET | Freq: Every day | ORAL | Status: DC
  Administered 2014-09-11 – 2014-09-12 (×2): 50 mg via ORAL
  Filled 2014-09-10 (×3): qty 1

## 2014-09-10 MED ORDER — MAGNESIUM OXIDE 400 (241.3 MG) MG PO TABS
400.0000 mg | ORAL_TABLET | Freq: Every day | ORAL | Status: DC
  Administered 2014-09-10: 400 mg via ORAL
  Filled 2014-09-10 (×2): qty 1

## 2014-09-10 MED ORDER — FOLIC ACID 1 MG PO TABS
1.0000 mg | ORAL_TABLET | Freq: Every day | ORAL | Status: DC
  Administered 2014-09-11 – 2014-09-13 (×3): 1 mg via ORAL
  Filled 2014-09-10 (×3): qty 1

## 2014-09-10 MED ORDER — CALCIUM CARBONATE-VITAMIN D 500-200 MG-UNIT PO TABS
1.0000 | ORAL_TABLET | Freq: Every day | ORAL | Status: DC
  Filled 2014-09-10: qty 1

## 2014-09-10 MED ORDER — SUCRALFATE 1 G PO TABS
1.0000 g | ORAL_TABLET | Freq: Three times a day (TID) | ORAL | Status: DC
  Filled 2014-09-10 (×2): qty 1

## 2014-09-10 MED ORDER — FAMOTIDINE IN NACL 20-0.9 MG/50ML-% IV SOLN
20.0000 mg | Freq: Two times a day (BID) | INTRAVENOUS | Status: DC
  Administered 2014-09-10 – 2014-09-11 (×2): 20 mg via INTRAVENOUS
  Filled 2014-09-10 (×3): qty 50

## 2014-09-10 MED ORDER — SUCRALFATE 1 G PO TABS
1.0000 g | ORAL_TABLET | Freq: Three times a day (TID) | ORAL | Status: DC
  Administered 2014-09-10 – 2014-09-11 (×2): 1 g via ORAL
  Filled 2014-09-10 (×6): qty 1

## 2014-09-10 MED ORDER — ACETAMINOPHEN 325 MG PO TABS: 650.0000 mg | ORAL_TABLET | Freq: Four times a day (QID) | ORAL | Status: DC | PRN

## 2014-09-10 MED ORDER — ACETAMINOPHEN 650 MG RE SUPP: 650.0000 mg | Freq: Four times a day (QID) | RECTAL | Status: DC | PRN

## 2014-09-10 MED ORDER — COENZYME Q10 30 MG PO CAPS: 30.0000 mg | ORAL_CAPSULE | Freq: Two times a day (BID) | ORAL | Status: DC

## 2014-09-10 MED ORDER — SALINE SPRAY 0.65 % NA SOLN: 1.0000 | NASAL | Status: DC | PRN

## 2014-09-10 MED ORDER — CHROMIUM 1 MG PO CAPS: 1.0000 mg | ORAL_CAPSULE | Freq: Every day | ORAL | Status: DC

## 2014-09-10 MED ORDER — MELATONIN 3 MG PO TABS
1.0000 | ORAL_TABLET | Freq: Every day | ORAL | Status: DC
  Administered 2014-09-10 – 2014-09-12 (×3): 3 mg via ORAL
  Filled 2014-09-10 (×5): qty 1

## 2014-09-10 MED ORDER — VITAMIN B-6 100 MG PO TABS
100.0000 mg | ORAL_TABLET | Freq: Every day | ORAL | Status: DC
  Administered 2014-09-11 – 2014-09-13 (×3): 100 mg via ORAL
  Filled 2014-09-10 (×3): qty 1

## 2014-09-10 MED ORDER — ONDANSETRON HCL 4 MG/2ML IJ SOLN: 4.0000 mg | Freq: Three times a day (TID) | INTRAMUSCULAR | Status: DC | PRN

## 2014-09-10 MED ORDER — HYDRALAZINE HCL 20 MG/ML IJ SOLN: 5.0000 mg | INTRAMUSCULAR | Status: DC | PRN

## 2014-09-10 MED ORDER — CALCIUM CARBONATE-VITAMIN D 500-200 MG-UNIT PO TABS
1.0000 | ORAL_TABLET | Freq: Every day | ORAL | Status: DC
  Administered 2014-09-11 – 2014-09-13 (×3): 1 via ORAL
  Filled 2014-09-10 (×4): qty 1

## 2014-09-10 MED ORDER — SIMVASTATIN 20 MG PO TABS
20.0000 mg | ORAL_TABLET | Freq: Every day | ORAL | Status: DC
  Administered 2014-09-11 – 2014-09-12 (×2): 20 mg via ORAL
  Filled 2014-09-10 (×3): qty 1

## 2014-09-10 MED ORDER — B COMPLEX-C PO TABS
1.0000 | ORAL_TABLET | Freq: Every day | ORAL | Status: DC
  Administered 2014-09-11 – 2014-09-13 (×3): 1 via ORAL
  Filled 2014-09-10 (×3): qty 1

## 2014-09-10 MED ORDER — SODIUM CHLORIDE 0.9 % IV SOLN
INTRAVENOUS | Status: DC
  Administered 2014-09-10 – 2014-09-13 (×3): via INTRAVENOUS

## 2014-09-10 MED ORDER — SODIUM CHLORIDE 0.9 % IJ SOLN
3.0000 mL | Freq: Two times a day (BID) | INTRAMUSCULAR | Status: DC
  Administered 2014-09-10 – 2014-09-13 (×4): 3 mL via INTRAVENOUS

## 2014-09-10 MED ORDER — VITAMIN D3 25 MCG (1000 UNIT) PO TABS
1000.0000 [IU] | ORAL_TABLET | Freq: Every day | ORAL | Status: DC
  Administered 2014-09-11 – 2014-09-13 (×3): 1000 [IU] via ORAL
  Filled 2014-09-10 (×3): qty 1

## 2014-09-10 NOTE — ED Provider Notes (Signed)
CSN: [id number]     Arrival date & time 09/10/14  1757 History   First MD Initiated Contact with Patient 09/10/14 1934     Chief Complaint  Patient presents with  . Diarrhea  . Emesis     (Consider location/radiation/quality/duration/timing/severity/associated sxs/prior Treatment) HPI Comments: The patient is a 64 year old female, she has a history of hypercholesterolemia, hypertension, stroke and a history of polyps of the colon found on colonoscopy. She takes 2 baby aspirin's every day because of her history of strokes, over the last couple of weeks she has been using lots of ibuprofen because of minor aches and pains and an upper respiratory illness, 4 days ago she developed nausea and vomiting, brown stomach contents, had very little to eat for 2 days and then today started to have multiple episodes of bloody bowel movements. The blood was dark red and some bright red in color, associated with abdominal cramping, never has had this problem before. She denies any vomiting of blood, denies any significant back pain chest pain or shortness breath. She does feel lightheaded and slightly weak.  Patient is a 64 y.o. female presenting with diarrhea and vomiting. The history is provided by the patient.  Diarrhea Associated symptoms: vomiting   Emesis Associated symptoms: diarrhea     Past Medical History  Diagnosis Date  . Elevated cholesterol     takes Niacin daily and Simvastatin  . PONV (postoperative nausea and vomiting)   . GERD (gastroesophageal reflux disease)     no current med.  . Mucoid cyst of joint 08/2013    right index finger  . Arthritis     back, fingers  . Seasonal allergies   . Urinary incontinence   . Osteopenia 01/2014    T score -1.1 FRAX 14%/0.5%. Stable from prior DEXA  . Hypertension     takes Tenoretic and Lisinopril daily  . Headache(784.0)     occasionally  . Stroke 1998    x 2 - mild left-sided weakness  . Joint pain   . Joint swelling   . Chronic back  pain     arthritis   . History of colon polyps   . Urinary urgency   . Uterine prolapse    Past Surgical History  Procedure Laterality Date  . Oophorectomy Right 2000  . Tubal ligation    . Back surgery      x 2  . Gynecologic cryosurgery    . Nasal septum surgery    . Hysteroscopy w/d&c  12/07/2010    with resection of endometrial polyp  . Knee arthroscopy Left 2008  . Gum surgery    . Colonoscopy  11/30/2012  . Mass excision Right 08/15/2013    Procedure: RIGHT INDEX EXCISION MASS ;  Surgeon: Tennis Must, MD;  Location: [facility];  Service: Orthopedics;  Laterality: Right;  . Lip biopsy      done at MD office Fri 11/22/13  . Shoulder arthroscopy with open rotator cuff repair and distal clavicle acrominectomy Right 11/26/2013    Procedure: RIGHT SHOULDER ARTHROSCOPY WITH MINI OPEN ROTATOR CUFF REPAIR AND DISTAL CLAVICLE RESECTION, SUBACROMIAL DECOMPRESSION, POSSIBLE [facility] PATCH.;  Surgeon: [name], MD;  Location: [facility];  Service: Orthopedics;  Laterality: Right;   Family History  Problem Relation Age of Onset  . Hypertension Mother   . Heart disease Mother   . Diabetes Father   . Hypertension Father   . Heart disease Father   . Stroke Father   .  Diabetes Maternal Aunt   . Diabetes Paternal Grandmother   . Hypertension Paternal Grandfather   . Stroke Paternal Grandfather    History  Substance Use Topics  . Smoking status: Passive Smoke Exposure - Never Smoker  . Smokeless tobacco: Never Used  . Alcohol Use: Yes     Comment: rarely   OB History    Gravida Para Term Preterm AB TAB SAB Ectopic Multiple Living   2 2 2       2      Review of Systems  Gastrointestinal: Positive for vomiting and diarrhea.  All other systems reviewed and are negative.     Allergies  Eggs or egg-derived products; Etodolac; Fish-derived products; Omeprazole; and Pineapple  Home Medications   Prior to Admission medications   Medication Sig Start Date End  Date Taking? Authorizing Provider  aspirin 81 MG tablet Take 81 mg by mouth 2 (two) times daily.    Yes Historical Provider, MD  atenolol-chlorthalidone (TENORETIC) 50-25 MG per tablet TAKE 1 TABLET DAILY 12/27/13  Yes Raquel Dagoberto Ligas, NP  b complex vitamins tablet Take 1 tablet by mouth daily.   Yes Historical Provider, MD  Calcium Carbonate-Vitamin D (CALCIUM + D PO) Take 1 tablet by mouth daily.    Yes Historical Provider, MD  cholecalciferol (VITAMIN D) 1000 UNITS tablet Take 1,000 Units by mouth daily.     Yes Historical Provider, MD  Chromium 1 MG CAPS Take 1 mg by mouth daily.    Yes Historical Provider, MD  co-enzyme Q-10 30 MG capsule Take 30 mg by mouth 2 (two) times daily.    Yes Historical Provider, MD  folic acid (FOLVITE) 086 MCG tablet Take 400 mcg by mouth daily.   Yes Historical Provider, MD  ibuprofen (ADVIL,MOTRIN) 200 MG tablet Take 200 mg by mouth every 6 (six) hours as needed for mild pain or moderate pain.   Yes Historical Provider, MD  lisinopril (PRINIVIL,ZESTRIL) 2.5 MG tablet Take 1 tablet (2.5 mg total) by mouth daily. Patient taking differently: Take 2.5 mg by mouth at bedtime.  12/27/13  Yes Raquel Dagoberto Ligas, NP  Magnesium 100 MG CAPS Take 100 mg by mouth at bedtime.    Yes Historical Provider, MD  MELATONIN ER PO Take 1 tablet by mouth at bedtime.    Yes Historical Provider, MD  methocarbamol (ROBAXIN) 500 MG tablet Take 1 tablet (500 mg total) by mouth every 6 (six) hours as needed for muscle spasms (spasm). 11/26/13  Yes Phillips Hay, PA-C  Misc Natural Products (OSTEO BI-FLEX JOINT SHIELD PO) Take 1 tablet by mouth daily.    Yes Historical Provider, MD  Multiple Vitamin (MULTIVITAMIN) tablet Take 1 tablet by mouth daily.     Yes Historical Provider, MD  potassium chloride SA (KLOR-CON M20) 20 MEQ tablet Take 1 tablet (20 mEq total) by mouth 2 (two) times daily. 12/27/13  Yes Raquel Dagoberto Ligas, NP  pyridOXINE (VITAMIN B-6) 100 MG tablet Take 100 mg by mouth daily.   Yes Historical  Provider, MD  simvastatin (ZOCOR) 20 MG tablet Take 1 tablet (20 mg total) by mouth daily. Patient taking differently: Take 20 mg by mouth daily at 6 PM.  12/27/13  Yes Raquel Dagoberto Ligas, NP  sodium chloride (OCEAN) 0.65 % SOLN nasal spray Place 1 spray into both nostrils as needed for congestion.   Yes Historical Provider, MD  Zinc Sulfate (ZINC 15 PO) Take 1 tablet by mouth daily.    Yes Historical Provider, MD  oxyCODONE-acetaminophen (  ROXICET) 5-325 MG per tablet Take 1-2 tablets by mouth every 4 (four) hours as needed. Patient not taking: Reported on 09/10/2014 11/26/13   Phillips Hay, PA-C   BP 117/67 mmHg  Pulse 61  Temp(Src) 98.5 F (36.9 C) (Oral)  Resp 18  Wt 195 lb (88.451 kg)  SpO2 98%  LMP 02/09/2005 Physical Exam  Constitutional: She appears well-developed and well-nourished. No distress.  HENT:  Head: Normocephalic and atraumatic.  Mouth/Throat: Oropharynx is clear and moist. No oropharyngeal exudate.  Eyes: Conjunctivae and EOM are normal. Pupils are equal, round, and reactive to light. Right eye exhibits no discharge. Left eye exhibits no discharge. No scleral icterus.  Neck: Normal range of motion. Neck supple. No JVD present. No thyromegaly present.  Cardiovascular: Normal rate, regular rhythm, normal heart sounds and intact distal pulses.  Exam reveals no gallop and no friction rub.   No murmur heard. Pulmonary/Chest: Effort normal and breath sounds normal. No respiratory distress. She has no wheezes. She has no rales.  Abdominal: Soft. Bowel sounds are normal. She exhibits no distension and no mass. There is no tenderness.  Genitourinary:  Chaperone present for exam, no fissures hemorrhoids or masses, no tenderness, dark red blood in the rectal vault.  Musculoskeletal: Normal range of motion. She exhibits no edema or tenderness.  Lymphadenopathy:    She has no cervical adenopathy.  Neurological: She is alert. Coordination normal.  Skin: Skin is warm and dry. No rash  noted. No erythema.  Psychiatric: She has a normal mood and affect. Her behavior is normal.  Nursing note and vitals reviewed.   ED Course  Procedures (including critical care time) Labs Review Labs Reviewed  CBC WITH DIFFERENTIAL/PLATELET - Abnormal; Notable for the following:    WBC 13.6 (*)    Hemoglobin 15.1 (*)    Neutro Abs 10.2 (*)    All other components within normal limits  COMPREHENSIVE METABOLIC PANEL - Abnormal; Notable for the following:    Glucose, Bld 109 (*)    Creatinine, Ser 1.11 (*)    AST 47 (*)    GFR calc non Af Amer 51 (*)    GFR calc Af Amer 59 (*)    All other components within normal limits  POC OCCULT BLOOD, ED - Abnormal; Notable for the following:    Fecal Occult Bld POSITIVE (*)    All other components within normal limits  LIPASE, BLOOD  PROTIME-INR  APTT  TYPE AND SCREEN    Imaging Review No results found.   EKG Interpretation None      MDM   Final diagnoses:  Rectal bleeding    The patient has gastrointestinal bleeding, her white blood cell count is 13.6, hemoglobin is 15, creatinine is 1.1, she will need further evaluation by admission to the hospital to evaluate for diverticular bleed, transient hemoglobin, otherwise has a stable abdomen without any peritoneal signs.  Labs reviewed, no anemia - ongoing bleeding - gross positive blood in stool,   Will admit to Dr. Blaine Hamper with hospitalist overnight.  Noemi Chapel, MD 09/10/14 2116

## 2014-09-10 NOTE — Telephone Encounter (Signed)
[facility]  Patient Name: ADAJA WANDER  DOB: 07-17-1950    Initial Comment Caller states she has been sick for a few days. Throwing up. This morning, started having stomach pains and diarrhea. It is blood and mucous.    Nurse Assessment  Nurse: Harlow Mares, RN, Suanne Marker Date/Time (Eastern Time): 09/10/2014 3:44:43 PM  Confirm and document reason for call. If symptomatic, describe symptoms. ---Caller states she has been sick for a few days. Throwing up. Sunday reports vomiting then no vomiting for a couple of days. This morning, started having stomach pains (bellow her belly button) and diarrhea. It is blood and mucous x4-5 times. Reports bright red bleeding. Reports that she had fever on Sunday, but nothing very high. Has had some chills.  Has the patient traveled out of the country within the last 30 days? ---No  Does the patient require triage? ---Yes  Related visit to physician within the last 2 weeks? ---No  Does the PT have any chronic conditions? (i.e. diabetes, asthma, etc.) ---Yes  List chronic conditions. ---hypertension; hx of strokes in 1998.     Guidelines    Guideline Title Affirmed Question Affirmed Notes  Rectal Bleeding [1] Large amount of blood AND (2) adult stable    Final Disposition User   Go to ED Now Harlow Mares, RN, Suanne Marker

## 2014-09-10 NOTE — H&P (Signed)
[facility] History and Physical  [name] WTU:[id number] DOB: [birth year] DOA: 09/10/2014  Referring physician: ED physician PCP: Olga Millers, MD  Specialists:   Chief Complaint: Nausea, vomiting, bloody stool  HPI: [name] is a 64 y.o. female with PMH of history of polyps of the colon, hypertension, hyperlipidemia, GERD, overweight, history of stroke, history of chronic back pain.  Patient reports that she started having nausea and vomiting with brown colored stomach contents 3 days ago. She denies any vomiting of blood. She had very little to eat for 2 days and then today started to have multiple episodes of bloody bowel movements. She reports the bloody stool was dark red and some bright red in color. It is associated with abdominal cramping. She has never had this problem before. Of note, she takes 2 baby aspirin's every day because of her history of strokes, over the last couple of weeks she has been using lots of ibuprofen because of knee pain and back pain. She has hills, but not fever. Feeling muffled ears, without ear pain or discharge.  Currently patient denies running nose, headaches, cough, chest pain, SOB, dysuria, urgency, frequency, hematuria, skin rashes or leg swelling. No unilateral weakness, numbness or tingling sensations. No vision change or hearing loss.  In ED, patient was found to have WBC 13.6, hemoglobin 15.1, normal temperature, no tachycardia, INR 1.1, PTT 28. Patient is admitted to inpatient for further evaluation and treatment.  Where does patient live?   At home   Can patient participate in ADLs?  Yes    Review of Systems:   General: no fevers, chills, no changes in body weight, has poor appetite, has fatigue HEENT: no blurry vision, or sore throat. Has muffled ears Pulm: no dyspnea, coughing, wheezing CV: no chest pain, palpitations Abd: has nausea, vomiting, abdominal pain, diarrhea, no constipation GU: no dysuria, burning on  urination, increased urinary frequency, hematuria  Ext: no leg edema Neuro: no unilateral weakness, numbness, or tingling, no vision change or hearing loss Skin: no rash MSK: No muscle spasm, no deformity, no limitation of range of movement in spin Heme: No easy bruising.  Travel history: No recent long distant travel.  Allergy:  Allergies  Allergen Reactions  . Eggs Or Egg-Derived Products Other (See Comments)    GI UPSET, JOINT PAIN, DIFF. SWALLOWING  . Etodolac Other (See Comments)    ABD. CRAMPING  . Fish-Derived Products Other (See Comments)    GI UPSET, JOINT PAIN, DIFF. SWALLOWING   . Omeprazole Other (See Comments)    ABD. CRAMPING  . Pineapple Other (See Comments)    GI UPSET, JOINT PAIN, DIFF. SWALLOWING    Past Medical History  Diagnosis Date  . Elevated cholesterol     takes Niacin daily and Simvastatin  . PONV (postoperative nausea and vomiting)   . GERD (gastroesophageal reflux disease)     no current med.  . Mucoid cyst of joint 08/2013    right index finger  . Arthritis     back, fingers  . Seasonal allergies   . Urinary incontinence   . Osteopenia 01/2014    T score -1.1 FRAX 14%/0.5%. Stable from prior DEXA  . Hypertension     takes Tenoretic and Lisinopril daily  . Headache(784.0)     occasionally  . Stroke 1998    x 2 - mild left-sided weakness  . Joint pain   . Joint swelling   . Chronic back pain     arthritis   .  History of colon polyps   . Urinary urgency   . Uterine prolapse     Past Surgical History  Procedure Laterality Date  . Oophorectomy Right 2000  . Tubal ligation    . Back surgery      x 2  . Gynecologic cryosurgery    . Nasal septum surgery    . Hysteroscopy w/d&c  12/07/2010    with resection of endometrial polyp  . Knee arthroscopy Left 2008  . Gum surgery    . Colonoscopy  11/30/2012  . Mass excision Right 08/15/2013    Procedure: RIGHT INDEX EXCISION MASS ;  Surgeon: Tennis Must, MD;  Location: [place];  Service: Orthopedics;  Laterality: Right;  . Lip biopsy      done at MD office Fri 11/22/13  . Shoulder arthroscopy with open rotator cuff repair and distal clavicle acrominectomy Right 11/26/2013    Procedure: RIGHT SHOULDER ARTHROSCOPY WITH MINI OPEN ROTATOR CUFF REPAIR AND DISTAL CLAVICLE RESECTION, SUBACROMIAL DECOMPRESSION, POSSIBLE [facility] PATCH.;  Surgeon: [name], MD;  Location: [place];  Service: Orthopedics;  Laterality: Right;    Social History:  reports that she has been passively smoking.  She has never used smokeless tobacco. She reports that she drinks alcohol. She reports that she does not use illicit drugs.  Family History:  Family History  Problem Relation Age of Onset  . Hypertension Mother   . Heart disease Mother   . Diabetes Father   . Hypertension Father   . Heart disease Father   . Stroke Father   . Diabetes Maternal Aunt   . Diabetes Paternal Grandmother   . Hypertension Paternal Grandfather   . Stroke Paternal Grandfather      Prior to Admission medications   Medication Sig Start Date End Date Taking? Authorizing Provider  aspirin 81 MG tablet Take 81 mg by mouth 2 (two) times daily.    Yes Historical Provider, MD  atenolol-chlorthalidone (TENORETIC) 50-25 MG per tablet TAKE 1 TABLET DAILY 12/27/13  Yes Raquel Dagoberto Ligas, NP  b complex vitamins tablet Take 1 tablet by mouth daily.   Yes Historical Provider, MD  Calcium Carbonate-Vitamin D (CALCIUM + D PO) Take 1 tablet by mouth daily.    Yes Historical Provider, MD  cholecalciferol (VITAMIN D) 1000 UNITS tablet Take 1,000 Units by mouth daily.     Yes Historical Provider, MD  Chromium 1 MG CAPS Take 1 mg by mouth daily.    Yes Historical Provider, MD  co-enzyme Q-10 30 MG capsule Take 30 mg by mouth 2 (two) times daily.    Yes Historical Provider, MD  folic acid (FOLVITE) 921 MCG tablet Take 400 mcg by mouth daily.   Yes Historical Provider, MD  ibuprofen (ADVIL,MOTRIN) 200 MG tablet Take 200 mg  by mouth every 6 (six) hours as needed for mild pain or moderate pain.   Yes Historical Provider, MD  lisinopril (PRINIVIL,ZESTRIL) 2.5 MG tablet Take 1 tablet (2.5 mg total) by mouth daily. Patient taking differently: Take 2.5 mg by mouth at bedtime.  12/27/13  Yes Raquel Dagoberto Ligas, NP  Magnesium 100 MG CAPS Take 100 mg by mouth at bedtime.    Yes Historical Provider, MD  MELATONIN ER PO Take 1 tablet by mouth at bedtime.    Yes Historical Provider, MD  methocarbamol (ROBAXIN) 500 MG tablet Take 1 tablet (500 mg total) by mouth every 6 (six) hours as needed for muscle spasms (spasm). 11/26/13  Yes Freda Munro  [place], PA-C  Misc Natural Products (OSTEO BI-FLEX JOINT SHIELD PO) Take 1 tablet by mouth daily.    Yes Historical Provider, MD  Multiple Vitamin (MULTIVITAMIN) tablet Take 1 tablet by mouth daily.     Yes Historical Provider, MD  potassium chloride SA (KLOR-CON M20) 20 MEQ tablet Take 1 tablet (20 mEq total) by mouth 2 (two) times daily. 12/27/13  Yes Raquel Dagoberto Ligas, NP  pyridOXINE (VITAMIN B-6) 100 MG tablet Take 100 mg by mouth daily.   Yes Historical Provider, MD  simvastatin (ZOCOR) 20 MG tablet Take 1 tablet (20 mg total) by mouth daily. Patient taking differently: Take 20 mg by mouth daily at 6 PM.  12/27/13  Yes Raquel Dagoberto Ligas, NP  sodium chloride (OCEAN) 0.65 % SOLN nasal spray Place 1 spray into both nostrils as needed for congestion.   Yes Historical Provider, MD  Zinc Sulfate (ZINC 15 PO) Take 1 tablet by mouth daily.    Yes Historical Provider, MD  oxyCODONE-acetaminophen (ROXICET) 5-325 MG per tablet Take 1-2 tablets by mouth every 4 (four) hours as needed. Patient not taking: Reported on 09/10/2014 11/26/13   Phillips Hay, PA-C    Physical Exam: Filed Vitals:   09/10/14 2054 09/10/14 2100 09/10/14 2130 09/10/14 2216  BP: 117/67 111/72 117/68 127/96  Pulse: 61 63 64 71  Temp:    98.9 F (37.2 C)  TempSrc:    Oral  Resp: 18  16 18   Height:    5' 3"  (1.6 m)  Weight:    84.823 kg (187 lb)   SpO2: 98% 97% 98% 99%   General: Not in acute distress HEENT:       Eyes: PERRL, EOMI, no scleral icterus.       ENT: No discharge from the ears and nose, no pharynx injection, no tonsillar enlargement. Tympanic membrane normal-looking       Neck: No JVD, no bruit, no mass felt. Heme: No neck lymph node enlargement. Cardiac: S1/S2, RRR, No murmurs, No gallops or rubs. Pulm: Good air movement bilaterally. No rales, wheezing, rhonchi or rubs. Abd: Soft, nondistended, mild tenderness diffusly, no rebound pain, no organomegaly, BS present. Ext: No pitting leg edema bilaterally. 2+DP/PT pulse bilaterally. Musculoskeletal: No joint deformities, No joint redness or warmth, no limitation of ROM in spin. Skin: No rashes.  Neuro: Alert, oriented X3, cranial nerves II-XII grossly intact, muscle strength 5/5 in all extremities, sensation to light touch intact.  Psych: Patient is not psychotic, no suicidal or hemocidal ideation.  Labs on Admission:  Basic Metabolic Panel:  Recent Labs Lab 09/10/14 1815  NA 138  K 3.8  CL 103  CO2 23  GLUCOSE 109*  BUN 14  CREATININE 1.11*  CALCIUM 9.6   Liver Function Tests:  Recent Labs Lab 09/10/14 1815  AST 47*  ALT 36  ALKPHOS 52  BILITOT 0.6  PROT 7.2  ALBUMIN 4.0    Recent Labs Lab 09/10/14 1815  LIPASE 27   No results for input(s): AMMONIA in the last 168 hours. CBC:  Recent Labs Lab 09/10/14 1815 09/10/14 2308  WBC 13.6* 14.2*  NEUTROABS 10.2*  --   HGB 15.1* 13.6  HCT 44.6 40.8  MCV 89.2 88.9  PLT 229 206   Cardiac Enzymes: No results for input(s): CKTOTAL, CKMB, CKMBINDEX, TROPONINI in the last 168 hours.  BNP (last 3 results) No results for input(s): BNP in the last 8760 hours.  ProBNP (last 3 results) No results for input(s): PROBNP in the last 8760  hours.  CBG: No results for input(s): GLUCAP in the last 168 hours.  Radiological Exams on Admission: No results found.  EKG: Not done in ED, will get one.    Assessment/Plan Principal Problem:   GIB (gastrointestinal bleeding) Active Problems:   Hyperlipidemia   Essential hypertension   GASTROESOPHAGEAL REFLUX DISEASE   Osteoarthritis   GI bleed  GIB (gastrointestinal bleeding): Patient's diarrhea seems to be due to GI bleeding. This is possibly due to recent ibuprofen use and aspirin use. Hemoglobin is stable on admission. Given her leukocytosis, need to rule out other infectious etiology, such as C. difficile colitis.  - will admit to tele bed - NPO - NS at 100 mL/hr - IV pepcide and oral sucralfate (patient is allergic to PPI).  - Hold ASA and d/c ibupfrofen - Zofran IV for nausea - Avoid NSAIDs and SQ heparin - Maintain IV access (2 large bore IVs if possible). - Monitor closely and follow q6h cbc, transfuse as necessary. - LaB: INR, PTT - may get GI consult if Hgb drops significantly -check c diff pcr  Hyperlipidemia: LDL was 91 on 09/04/14. -Continue Zocor  HTN: -Hold lisinopril and switched combined atenolol-chlorthalidone pill to atenolol 50 mg daily, since patient's creatinine is slightly up from baseline and patient needs IVF -IV hydralazine when necessary  GASTROESOPHAGEAL REFLUX DISEASE: - Pepcide and sucralfate  Back pain: -Tylenol, Robaxin and when necessary oxycodone  Hx of stroke: no new issues: -hold ASA due to GIB -bp control and lipid management as above  DVT ppx: SCD  Code Status: Full code Family Communication: Yes, patient's   daughter    at bed side Disposition Plan: Admit to inpatient   Date of Service 09/11/2014    Ivor Costa [facility] Pager [phone]  If 7PM-7AM, please contact night-coverage [email or web address] Password [email or web address] 09/11/2014, 2:04 AM

## 2014-09-10 NOTE — ED Notes (Signed)
Pt in c/o n/v that started Saturday night, today patient began to have diarrhea and noted bright and dark red blood in her stool, c/o abdominal cramping, no distress noted

## 2014-09-10 NOTE — Progress Notes (Signed)
PHARMACIST - PHYSICIAN ORDER COMMUNICATION  CONCERNING: P&T Medication Policy on Herbal Medications  DESCRIPTION:  This patient's order for:  Chromium and CoEnyzme Q-10  has been noted.  This product(s) is classified as an "herbal" or natural product. Due to a lack of definitive safety studies or FDA approval, nonstandard manufacturing practices, plus the potential risk of unknown drug-drug interactions while on inpatient medications, the Pharmacy and Therapeutics Committee does not permit the use of "herbal" or natural products of this type within [facility].   ACTION TAKEN: The pharmacy department is unable to verify this order at this time and your patient has been informed of this safety policy. Please reevaluate patient's clinical condition at discharge and address if the herbal or natural product(s) should be resumed at that time.

## 2014-09-11 ENCOUNTER — Encounter (HOSPITAL_COMMUNITY): Payer: Self-pay | Admitting: Physician Assistant

## 2014-09-11 ENCOUNTER — Inpatient Hospital Stay (HOSPITAL_COMMUNITY): Payer: No Typology Code available for payment source

## 2014-09-11 DIAGNOSIS — A09 Infectious gastroenteritis and colitis, unspecified: Secondary | ICD-10-CM

## 2014-09-11 DIAGNOSIS — R197 Diarrhea, unspecified: Secondary | ICD-10-CM | POA: Diagnosis present

## 2014-09-11 DIAGNOSIS — K219 Gastro-esophageal reflux disease without esophagitis: Secondary | ICD-10-CM

## 2014-09-11 DIAGNOSIS — K922 Gastrointestinal hemorrhage, unspecified: Secondary | ICD-10-CM

## 2014-09-11 DIAGNOSIS — I10 Essential (primary) hypertension: Secondary | ICD-10-CM

## 2014-09-11 DIAGNOSIS — R935 Abnormal findings on diagnostic imaging of other abdominal regions, including retroperitoneum: Secondary | ICD-10-CM

## 2014-09-11 LAB — CLOSTRIDIUM DIFFICILE BY PCR: CDIFFPCR: NEGATIVE

## 2014-09-11 LAB — COMPREHENSIVE METABOLIC PANEL
ALT: 28 U/L (ref 14–54)
AST: 36 U/L (ref 15–41)
Albumin: 3.2 g/dL — ABNORMAL LOW (ref 3.5–5.0)
Alkaline Phosphatase: 42 U/L (ref 38–126)
Anion gap: 10 (ref 5–15)
BILIRUBIN TOTAL: 0.7 mg/dL (ref 0.3–1.2)
BUN: 10 mg/dL (ref 6–20)
CHLORIDE: 105 mmol/L (ref 101–111)
CO2: 24 mmol/L (ref 22–32)
Calcium: 8.6 mg/dL — ABNORMAL LOW (ref 8.9–10.3)
Creatinine, Ser: 0.97 mg/dL (ref 0.44–1.00)
GFR calc non Af Amer: >60 mL/min (ref >60)
GLUCOSE: 94 mg/dL (ref 65–99)
Potassium: 3.4 mmol/L — ABNORMAL LOW (ref 3.5–5.1)
SODIUM: 139 mmol/L (ref 135–145)
Total Protein: 6.3 g/dL — ABNORMAL LOW (ref 6.5–8.1)

## 2014-09-11 LAB — CBC
HCT: 42.4 % (ref 36.0–46.0)
HEMATOCRIT: 40.7 % (ref 36.0–46.0)
HEMOGLOBIN: 14 g/dL (ref 12.0–15.0)
Hemoglobin: 13.7 g/dL (ref 12.0–15.0)
MCH: 29.9 pg (ref 26.0–34.0)
MCH: 29.6 pg (ref 26.0–34.0)
MCHC: 33.7 g/dL (ref 30.0–36.0)
MCHC: 33 g/dL (ref 30.0–36.0)
MCV: 88.9 fL (ref 78.0–100.0)
MCV: 89.6 fL (ref 78.0–100.0)
PLATELETS: 184 10*3/uL (ref 150–400)
Platelets: 185 10*3/uL (ref 150–400)
RBC: 4.58 MIL/uL (ref 3.87–5.11)
RBC: 4.73 MIL/uL (ref 3.87–5.11)
RDW: 13.9 % (ref 11.5–15.5)
RDW: 13.8 % (ref 11.5–15.5)
WBC: 10.6 10*3/uL — AB (ref 4.0–10.5)
WBC: 9.6 10*3/uL (ref 4.0–10.5)

## 2014-09-11 LAB — GLUCOSE, CAPILLARY: Glucose-Capillary: 79 mg/dL (ref 65–99)

## 2014-09-11 MED ORDER — CIPROFLOXACIN IN D5W 400 MG/200ML IV SOLN
400.0000 mg | Freq: Two times a day (BID) | INTRAVENOUS | Status: DC
  Administered 2014-09-11 – 2014-09-13 (×4): 400 mg via INTRAVENOUS
  Filled 2014-09-11 (×5): qty 200

## 2014-09-11 MED ORDER — ONDANSETRON HCL 4 MG/2ML IJ SOLN: 4.0000 mg | Freq: Three times a day (TID) | INTRAMUSCULAR | Status: DC | PRN

## 2014-09-11 MED ORDER — POTASSIUM CHLORIDE CRYS ER 20 MEQ PO TBCR
40.0000 meq | EXTENDED_RELEASE_TABLET | Freq: Once | ORAL | Status: AC
  Administered 2014-09-11: 40 meq via ORAL
  Filled 2014-09-11: qty 2

## 2014-09-11 MED ORDER — IOHEXOL 300 MG/ML  SOLN
100.0000 mL | Freq: Once | INTRAMUSCULAR | Status: AC | PRN
  Administered 2014-09-11: 100 mL via INTRAVENOUS

## 2014-09-11 MED ORDER — IOHEXOL 300 MG/ML  SOLN
25.0000 mL | INTRAMUSCULAR | Status: AC
  Administered 2014-09-11 (×2): 25 mL via ORAL

## 2014-09-11 MED ORDER — METRONIDAZOLE 250 MG PO TABS
250.0000 mg | ORAL_TABLET | Freq: Three times a day (TID) | ORAL | Status: DC
  Administered 2014-09-11 – 2014-09-13 (×5): 250 mg via ORAL
  Filled 2014-09-11 (×9): qty 1

## 2014-09-11 MED ORDER — PANTOPRAZOLE SODIUM 40 MG IV SOLR
40.0000 mg | Freq: Two times a day (BID) | INTRAVENOUS | Status: DC
  Administered 2014-09-11: 40 mg via INTRAVENOUS
  Filled 2014-09-11 (×2): qty 40

## 2014-09-11 MED ORDER — FAMOTIDINE 40 MG PO TABS
40.0000 mg | ORAL_TABLET | Freq: Every day | ORAL | Status: DC
  Administered 2014-09-11 – 2014-09-13 (×3): 40 mg via ORAL
  Filled 2014-09-11 (×3): qty 1

## 2014-09-11 NOTE — Progress Notes (Signed)
OT Cancellation Note  Patient Details Name: [name] MRN: [id number] DOB: 1950/08/03   Cancelled Treatment:    Reason Eval/Treat Not Completed: OT screened, no needs identified, will sign off  Taurus Willis OTR/L 09/11/2014, 1:55 PM

## 2014-09-11 NOTE — Care Management Note (Signed)
Case Management Note  Patient Details  Name: [name] MRN: [id number] Date of Birth: 09-21-1950  Subjective/Objective:     Admitted with GIB               Action/Plan: CM following for DCP  Expected Discharge Date:     09/13/2013             Expected Discharge Plan:   Home  Status of Service:   In progress   Sherrilyn Rist [phone] 09/11/2014, 1:48 PM

## 2014-09-11 NOTE — Telephone Encounter (Signed)
It sounds like she may have gastroenteritis, however given the bleeding that she is experiencing I would recommend she be seen at an Urgent Care or ED.

## 2014-09-11 NOTE — Progress Notes (Signed)
PT Cancellation Note  Patient Details Name: [name] MRN: [id number] DOB: [birth year]   Cancelled Treatment:    Reason Eval/Treat Not Completed: PT screened, no needs identified, will sign off (pt moving independently in room, reports no falls, needs or change in function with RN confirmed. No needs and pt denies therapy. Will sign off)   Melford Aase 09/11/2014, 11:04 AM Elwyn Reach, [place]

## 2014-09-11 NOTE — Progress Notes (Signed)
[facility] PROGRESS NOTE  [name] [id number]:[id number] DOB: March 22, 1951 DOA: 09/10/2014 PCP: Olga Millers, MD  Assessment/Plan: #1 GI bleed Questionable etiology. May be lower GI bleed in patient with a history of mild diverticulosis in the transfer of the sigmoid colon. Colonoscopy of August 2014 versus upper GI bleed as patient is on NSAIDs and aspirin versus ischemic colitis as patient with some abdominal cramping and some borderline blood pressure on admission. C. difficile PCR was negative. Hemoglobin currently at 14 from 15.1 on admission. Patient with bloody bowel movement this morning. Will place patient on clear liquids. Change Pepcid to PPI IV every 12 hours. Hold ASA. Serial CBC. ?? CT of the abdomen and pelvis to rule out ischemic colitis, will defer to GI. Consult with GI for further evaluation and management.  #2 Hyperlipidemia LDL 91. Zocor.  #3 HTN Lisinopril on hold. Will hold atenolol secondary to borderline blood pressure. Follow.  #4 gastroesophageal reflux disease Change Pepcid to PPI. Continue sucralfate.  #5 back pain Continue Tylenol, Robaxin, oxycodone as needed.  #6 history of stroke Will hold aspirin secondary to problem #1. Continue risk factor modification.  #7 leukocytosis Likely reactive leukocytosis. WBC is trending down. Follow.  #8 hypokalemia Replete.  #9 prophylaxis PPI for GI prophylaxis. SCDs for DVT prophylaxis.  Code Status: Full Family Communication: Updated patient. No family at bedside. Disposition Plan: Remain on telemetry.   Consultants:  GI: Pending  Procedures:  None  Antibiotics:  None  HPI/Subjective: Patient with bloody BM this morning per nursing. Patient denies any nausea, or emesis. Patient with some abdominal cramping prior to admission. No SOB. No CP.  Objective: Filed Vitals:   09/11/14 0632  BP: 108/59  Pulse: 70  Temp: 98.7 F (37.1 C)  Resp: 17    Intake/Output Summary (Last 24  hours) at 09/11/14 1158 Last data filed at 09/11/14 0927  Gross per 24 hour  Intake 726.67 ml  Output    825 ml  Net -98.33 ml   Filed Weights   09/10/14 2216 09/11/14 0410 09/11/14 0632  Weight: 84.823 kg (187 lb) 71.26 kg (157 lb 1.6 oz) 84.324 kg (185 lb 14.4 oz)    Exam:   General:  NAD  Cardiovascular: RRR  Respiratory: CTAB  Abdomen: Soft/NT/ND/+BS  Musculoskeletal: No c/c/e  Data Reviewed: Basic Metabolic Panel:  Recent Labs Lab 09/10/14 1815 09/11/14 0422  NA 138 139  K 3.8 3.4*  CL 103 105  CO2 23 24  GLUCOSE 109* 94  BUN 14 10  CREATININE 1.11* 0.97  CALCIUM 9.6 8.6*   Liver Function Tests:  Recent Labs Lab 09/10/14 1815 09/11/14 0422  AST 47* 36  ALT 36 28  ALKPHOS 52 42  BILITOT 0.6 0.7  PROT 7.2 6.3*  ALBUMIN 4.0 3.2*    Recent Labs Lab 09/10/14 1815  LIPASE 27   No results for input(s): AMMONIA in the last 168 hours. CBC:  Recent Labs Lab 09/10/14 1815 09/10/14 2308 09/11/14 0422 09/11/14 1014  WBC 13.6* 14.2* 10.6* 9.6  NEUTROABS 10.2*  --   --   --   HGB 15.1* 13.6 13.7 14.0  HCT 44.6 40.8 40.7 42.4  MCV 89.2 88.9 88.9 89.6  PLT 229 206 184 185   Cardiac Enzymes: No results for input(s): CKTOTAL, CKMB, CKMBINDEX, TROPONINI in the last 168 hours. BNP (last 3 results) No results for input(s): BNP in the last 8760 hours.  ProBNP (last 3 results) No results for input(s): PROBNP in the last 8760  hours.  CBG:  Recent Labs Lab 09/11/14 0631  GLUCAP 79    Recent Results (from the past 240 hour(s))  Clostridium Difficile by PCR     Status: None   Collection Time: 09/11/14  1:12 AM  Result Value Ref Range Status   C difficile by pcr NEGATIVE NEGATIVE Final     Studies: No results found.  Scheduled Meds: . atenolol  50 mg Oral Daily  . B-complex with vitamin C  1 tablet Oral Daily  . calcium-vitamin D  1 tablet Oral Q breakfast  . cholecalciferol  1,000 Units Oral Daily  . folic acid  1 mg Oral Daily  .  magnesium oxide  400 mg Oral QHS  . Melatonin  1 tablet Oral QHS  . multivitamin with minerals  1 tablet Oral Daily  . pantoprazole (PROTONIX) IV  40 mg Intravenous Q12H  . potassium chloride  40 mEq Oral Once  . pyridOXINE  100 mg Oral Daily  . simvastatin  20 mg Oral q1800  . sodium chloride  3 mL Intravenous Q12H  . sucralfate  1 g Oral TID WC & HS   Continuous Infusions: . sodium chloride 100 mL/hr at 09/10/14 2232    Principal Problem:   GIB (gastrointestinal bleeding) Active Problems:   Hyperlipidemia   Essential hypertension   GASTROESOPHAGEAL REFLUX DISEASE   Osteoarthritis   GI bleed    Time spent: [place] MD [facility] Pager [phone]. If 7PM-7AM, please contact night-coverage at [email or web address], password [facility] 09/11/2014, 11:58 AM  LOS: 1 day

## 2014-09-11 NOTE — Consult Note (Signed)
[facility] Consult: 12:33 PM 09/11/2014  LOS: 1 day    Referring Provider: Dr Grandville Silos, hospitalist  Primary Care Physician:  Olga Millers, MD Primary Gastroenterologist:  Dr. Fuller Plan    Reason for Consultation: hematochezia   HPI: [name] is a 64 y.o. female.  Hx CVA on daily 162 mg ASA. Arthritis, DJD, chronic pain but not on narcotics, spinal surgery x 2. Inactive GERD. Colon polyps.  11/2012  Colonoscopy for polyp surveillance No polyps. Mild transverse/sigmoid tics.  09/2007  Colonoscopy for polyp surveillance Melanosis coli. 4 polyps removed recto/sigmoid.  Pathology: hyperplastic 06/2004  EGD for epigastric pain and GER sxs.  Non-specific gastritis (path: mild, chronic inflammation, no H pylori), HH.  01/2003 Colonoscopy 2 cecal polyps removed.  Pathology: adenomatous  Admitted yesterday. Story started Sat 5/28 when she developed malaise, anorexia.  Persisted to 5/29 and developed non-bloody, bilious emesis that afternoon. Still felt unwell and had HA 5/30, 5/31. Appetite better 6/1. Left sided pain and diarrhea began 6/2.  After 3 to 4 episodes, began passing blood PR.  Initially this was maybe 100 to 200 cc volume but has slowed overnight with just teaspoon to tablespoon volume this AM.  No longer nauseated and is hungry.  Left abd pain better and was intermittent/crampy in nature.  Hgb 15.1 in ED, nadir 13.6 within 6 hours, 14 now.  Baseline ~ 14.5.  Normal MCV. coags normal.  WBCs max of 14.2, 9.6 currently. LFTs normal.    Since the weekend, had been taking 800 to 1200 mg Ibuprofen per day for knee pain but her malaise preceded the start of this medication.  No sick contacts.  No fever/chills/sweats.  No CP or SOB.  No new meds or dose changes.  No recent activity that could be a/w  dehydration.  + second hand smoke exposure, drinks a beer 1 to 2 x per year. Reflux sxs not active unless she eats specific foods she knows activate her sxs.  Does not require use of PPI, h2 blocker, antacids.   Past Medical History  Diagnosis Date  . Elevated cholesterol     takes Niacin daily and Simvastatin  . PONV (postoperative nausea and vomiting)   . GERD (gastroesophageal reflux disease) 06/2004    non-specific gastritis on EGD 06/2004  . Mucoid cyst of joint 08/2013    right index finger  . Arthritis     back, fingers with joint pain and swelling.  chronic back pain  . Seasonal allergies   . Urge incontinence   . Osteopenia 01/2014    T score -1.1 FRAX 14%/0.5%. Stable from prior DEXA  . Hypertension     takes Tenoretic and Lisinopril daily  . Headache(784.0)     occasionally  . Stroke 1998    x 2 - mild left-sided weakness  . Chronic back pain     arthritis   . History of colon polyps 2004, 2009    2004:adenomatous. 2009 hyperplastic.   Marland Kitchen Uterine prolapse     Past Surgical History  Procedure Laterality Date  .  Oophorectomy Right 2000  . Tubal ligation    . Back surgery      x 2  . Gynecologic cryosurgery    . Nasal septum surgery    . Hysteroscopy w/d&c  12/07/2010    with resection of endometrial polyp  . Knee arthroscopy Left 2008  . Gum surgery    . Colonoscopy  2004, 2009, 2014  . Mass excision Right 08/15/2013    Procedure: RIGHT INDEX EXCISION MASS ;  Surgeon: Tennis Must, MD;  Location: [place];  Service: Orthopedics;  Laterality: Right;  . Lip biopsy      done at MD office Fri 11/22/13  . Shoulder arthroscopy with open rotator cuff repair and distal clavicle acrominectomy Right 11/26/2013    Procedure: RIGHT SHOULDER ARTHROSCOPY WITH MINI OPEN ROTATOR CUFF REPAIR AND DISTAL CLAVICLE RESECTION, SUBACROMIAL DECOMPRESSION, POSSIBLE [facility] PATCH.;  Surgeon: [name], MD;  Location: [place];  Service: Orthopedics;  Laterality: Right;     Prior to Admission medications   Medication Sig Start Date End Date Taking? Authorizing Provider  aspirin 81 MG tablet Take 81 mg by mouth 2 (two) times daily.    Yes Historical Provider, MD  atenolol-chlorthalidone (TENORETIC) 50-25 MG per tablet TAKE 1 TABLET DAILY 12/27/13  Yes Raquel Dagoberto Ligas, NP  b complex vitamins tablet Take 1 tablet by mouth daily.   Yes Historical Provider, MD  Calcium Carbonate-Vitamin D (CALCIUM + D PO) Take 1 tablet by mouth daily.    Yes Historical Provider, MD  cholecalciferol (VITAMIN D) 1000 UNITS tablet Take 1,000 Units by mouth daily.     Yes Historical Provider, MD  Chromium 1 MG CAPS Take 1 mg by mouth daily.    Yes Historical Provider, MD  co-enzyme Q-10 30 MG capsule Take 30 mg by mouth 2 (two) times daily.    Yes Historical Provider, MD  folic acid (FOLVITE) 283 MCG tablet Take 400 mcg by mouth daily.   Yes Historical Provider, MD  lisinopril (PRINIVIL,ZESTRIL) 2.5 MG tablet Take 1 tablet (2.5 mg total) by mouth daily. Patient taking differently: Take 2.5 mg by mouth at bedtime.  12/27/13  Yes Raquel Dagoberto Ligas, NP  Magnesium 100 MG CAPS Take 100 mg by mouth at bedtime.    Yes Historical Provider, MD  MELATONIN ER PO Take 1 tablet by mouth at bedtime.    Yes Historical Provider, MD  methocarbamol (ROBAXIN) 500 MG tablet Take 1 tablet (500 mg total) by mouth every 6 (six) hours as needed for muscle spasms (spasm). 11/26/13  Yes Phillips Hay, PA-C  Misc Natural Products (OSTEO BI-FLEX JOINT SHIELD PO) Take 1 tablet by mouth daily.    Yes Historical Provider, MD  Multiple Vitamin (MULTIVITAMIN) tablet Take 1 tablet by mouth daily.     Yes Historical Provider, MD  potassium chloride SA (KLOR-CON M20) 20 MEQ tablet Take 1 tablet (20 mEq total) by mouth 2 (two) times daily. 12/27/13  Yes Raquel Dagoberto Ligas, NP  pyridOXINE (VITAMIN B-6) 100 MG tablet Take 100 mg by mouth daily.   Yes Historical Provider, MD  simvastatin (ZOCOR) 20 MG tablet Take 1 tablet (20 mg total) by  mouth daily. Patient taking differently: Take 20 mg by mouth daily at 6 PM.  12/27/13  Yes Raquel Dagoberto Ligas, NP  sodium chloride (OCEAN) 0.65 % SOLN nasal spray Place 1 spray into both nostrils as needed for congestion.   Yes Historical Provider, MD  Zinc Sulfate (ZINC 15 PO) Take 1  tablet by mouth daily.    Yes Historical Provider, MD           Scheduled Meds: . atenolol  50 mg Oral Daily  . B-complex with vitamin C  1 tablet Oral Daily  . calcium-vitamin D  1 tablet Oral Q breakfast  . cholecalciferol  1,000 Units Oral Daily  . folic acid  1 mg Oral Daily  . magnesium oxide  400 mg Oral QHS  . Melatonin  1 tablet Oral QHS  . multivitamin with minerals  1 tablet Oral Daily  . pantoprazole (PROTONIX) IV  40 mg Intravenous Q12H  . potassium chloride  40 mEq Oral Once  . pyridOXINE  100 mg Oral Daily  . simvastatin  20 mg Oral q1800  . sodium chloride  3 mL Intravenous Q12H   Infusions: . sodium chloride 100 mL/hr at 09/10/14 2232   PRN Meds: acetaminophen **OR** acetaminophen, hydrALAZINE, methocarbamol, ondansetron, oxyCODONE, sodium chloride   Allergies as of 09/10/2014 - Review Complete 09/10/2014  Allergen Reaction Noted  . Eggs or egg-derived products Other (See Comments) 08/12/2013  . Etodolac Other (See Comments)   . Fish-derived products Other (See Comments) 08/12/2013  . Omeprazole Other (See Comments)   . Pineapple Other (See Comments) 08/12/2013    Family History  Problem Relation Age of Onset  . Hypertension Mother   . Heart disease Mother   . Diabetes Father   . Hypertension Father   . Heart disease Father   . Stroke Father   . Diabetes Maternal Aunt   . Diabetes Paternal Grandmother   . Hypertension Paternal Grandfather   . Stroke Paternal Grandfather     History   Social History  . Marital Status: Married    Spouse Name: N/A  . Number of Children: N/A  . Years of Education: 12   Occupational History  . [employer]      Disability/Retired   Social History Main Topics  . Smoking status: Passive Smoke Exposure - Never Smoker  . Smokeless tobacco: Never Used  . Alcohol Use: Yes     Comment: rarely  . Drug Use: No  . Sexual Activity:    Partners: Male    Birth Control/ Protection: Surgical, Post-menopausal     Comment: BTL   Other Topics Concern  . Not on file   Social History Narrative   Rowynn grew up in [place], [place]. She lives with her husband [name]). She has 1 daughter [name]), 1 son [name]) and 2 stepchildren ([place]). They have 3 dogs ([name], [place], [name]) and 3 cats ([place], [place], [place]). She enjoys watching movies and outdoors activities.      Exercise - none   Caffeine - varies depending on the day. May have between 1-4 cups daily          REVIEW OF SYSTEMS: Constitutional:  Stable weight. Generally no problems with weakness or low energy  ENT:  No nose bleeds, or nasal congestion but her ears feel congested.  Pulm:  No cough or SOB CV:  No palpitations, no LE edema.  GU:  No hematuria, no frequency GI:  Per HPI.  No dysphagia.  No previous issues with diarrhea or BPR Heme:  No issues with excessive bleeding or bruising   Transfusions:  None ever Neuro:  No syncope.  No seizures.  If tired has some weakness in left UE, o/w no residaul CVA sxs.  Derm:  No itching, no rash or sores.  Endocrine:  No sweats or chills.  No polyuria or dysuria Immunization:  Not querried.   Travel:  None beyond local counties in last few months.    PHYSICAL EXAM: Vital signs in last 24 hours: Filed Vitals:   09/11/14 0632  BP: 108/59  Pulse: 70  Temp: 98.7 F (37.1 C)  Resp: 17   Wt Readings from Last 3 Encounters:  09/11/14 185 lb 14.4 oz (84.324 kg)  06/03/14 188 lb (85.276 kg)  11/26/13 192 lb (87.091 kg)   General: pleasant, comfortable, looks well.   Head:  No facial asymmetry or swelling. Eyes:  No icterus or pallor Ears:  Not HOH  Nose:  No congestion or discharge.    Mouth:  Clear, moist, good teeth.  Pink mm Neck:  No mass, no TMG, no JVS, no mass Lungs: CTA bil.   Heart:  RRR.  No mrg Abdomen:  Soft, NT, ND.  No mass, no HSM.  Marland Kitchen   Rectal: deferred.     Musc/Skeltl: no joint redness, swelling or deformity Extremities:  No CCE.  Feet warm.  Pedal pulses 3+  Neurologic:  Oriented x 3.  No limb weakness.  No tremor.  No gross deficits.  Skin:  No rash, sores, or telangectasia Tattoos:  none Nodes:  No cervical or inguinal adenopathy.    Psych:  Pleasant, cooperative, relaxed, affect normal  Intake/Output from previous day: 06/01 0701 - 06/02 0700 In: 726.7 [I.V.:676.7; IV Piggyback:50] Out: 825 [Urine:825] Intake/Output this shift: Total I/O In: 0  Out: 400 [Urine:400]  LAB RESULTS:  Recent Labs  09/10/14 2308 09/11/14 0422 09/11/14 1014  WBC 14.2* 10.6* 9.6  HGB 13.6 13.7 14.0  HCT 40.8 40.7 42.4  PLT 206 184 185   BMET Lab Results  Component Value Date   NA 139 09/11/2014   NA 138 09/10/2014   NA 143 11/25/2013   K 3.4* 09/11/2014   K 3.8 09/10/2014   K 3.8 11/25/2013   CL 105 09/11/2014   CL 103 09/10/2014   CL 103 11/25/2013   CO2 24 09/11/2014   CO2 23 09/10/2014   CO2 25 11/25/2013   GLUCOSE 94 09/11/2014   GLUCOSE 109* 09/10/2014   GLUCOSE 81 11/25/2013   BUN 10 09/11/2014   BUN 14 09/10/2014   BUN 16 11/25/2013   CREATININE 0.97 09/11/2014   CREATININE 1.11* 09/10/2014   CREATININE 1.02 11/25/2013   CALCIUM 8.6* 09/11/2014   CALCIUM 9.6 09/10/2014   CALCIUM 10.0 11/25/2013   LFT  Recent Labs  09/10/14 1815 09/11/14 0422  PROT 7.2 6.3*  ALBUMIN 4.0 3.2*  AST 47* 36  ALT 36 28  ALKPHOS 52 42  BILITOT 0.6 0.7   PT/INR Lab Results  Component Value Date   INR 1.10 09/10/2014   Lipase     Component Value Date/Time   LIPASE 27 09/10/2014 1815    RADIOLOGY STUDIES: No results found.  ENDOSCOPIC STUDIES: See HPI.   IMPRESSION:   *  Bloody stool, hematochezia with some intermittent left  sided abd pain.  ? Colitis (ischemic vs infectious), ? Atypical presentation of diverticulitis. Mild left sided diverticulosis per prior colonoscopy.   *  2004 hx adenomatous colon polyps, 2009 hx HP colon polyps.  No polyps 11/2012, due rescope in 11/2017  *  Mild GERD in 2006.  No regular GI meds.  Now on Protonix IV and carafate.     PLAN:     *  Cbc in AM.  Stop Carafate. Stop PPI and  replace with Famotidine while inpt. CT abdomen/pelvis ordered.   Continue clears, just started within the hour.    Azucena Freed  09/11/2014, 12:33 PM Pager: [phone]. Attending MD note:   I have taken a history, examined the patient, and reviewed the chart. I have reviewed CT scan from this afternoon which is consistent with acute  Sigmoid and descending colon colitis. Her presentation is  Suggestive of infectious colitis. Stool is watery and malodorous. Would start Cipro and Flagyl while stool cultures are pending.Agreee with enteric precautions.  [facility] Pager # [phone]

## 2014-09-11 NOTE — Telephone Encounter (Signed)
i think patient needs office visit---please advise, i will call her call back, thanks

## 2014-09-12 DIAGNOSIS — K50111 Crohn's disease of large intestine with rectal bleeding: Secondary | ICD-10-CM | POA: Diagnosis present

## 2014-09-12 DIAGNOSIS — K5010 Crohn's disease of large intestine without complications: Secondary | ICD-10-CM | POA: Diagnosis present

## 2014-09-12 LAB — BASIC METABOLIC PANEL
ANION GAP: 9 (ref 5–15)
BUN: 6 mg/dL (ref 6–20)
CO2: 24 mmol/L (ref 22–32)
Calcium: 8.9 mg/dL (ref 8.9–10.3)
Chloride: 107 mmol/L (ref 101–111)
Creatinine, Ser: 0.94 mg/dL (ref 0.44–1.00)
GFR calc Af Amer: >60 mL/min (ref >60)
GFR calc non Af Amer: >60 mL/min (ref >60)
GLUCOSE: 86 mg/dL (ref 65–99)
POTASSIUM: 3.8 mmol/L (ref 3.5–5.1)
Sodium: 140 mmol/L (ref 135–145)

## 2014-09-12 LAB — FECAL LACTOFERRIN, QUANT: Fecal Lactoferrin: POSITIVE

## 2014-09-12 LAB — CBC
HEMATOCRIT: 40.7 % (ref 36.0–46.0)
Hemoglobin: 13.5 g/dL (ref 12.0–15.0)
MCH: 29.7 pg (ref 26.0–34.0)
MCHC: 33.2 g/dL (ref 30.0–36.0)
MCV: 89.6 fL (ref 78.0–100.0)
Platelets: 176 10*3/uL (ref 150–400)
RBC: 4.54 MIL/uL (ref 3.87–5.11)
RDW: 14 % (ref 11.5–15.5)
WBC: 8.4 10*3/uL (ref 4.0–10.5)

## 2014-09-12 LAB — GLUCOSE, CAPILLARY: Glucose-Capillary: 99 mg/dL (ref 65–99)

## 2014-09-12 MED ORDER — HYDROXYZINE HCL 25 MG PO TABS
25.0000 mg | ORAL_TABLET | Freq: Four times a day (QID) | ORAL | Status: DC | PRN
  Filled 2014-09-12: qty 1

## 2014-09-12 NOTE — Telephone Encounter (Signed)
Left message advising patient of greg calone's note and to call us back is she has any questions

## 2014-09-12 NOTE — Progress Notes (Signed)
[facility] PROGRESS NOTE  KENITA BINES MAY:[id number] DOB: 1950/11/08 DOA: 09/10/2014 PCP: Olga Millers, MD  Assessment/Plan: #1 segmental colitis would rectal bleed /GI bleed Questionable etiology. May be secondary to ischemic colitis versus infectious colitis. C. difficile PCR was negative. Hemoglobin currently at 13.5 from 14 from 15.1 on admission. Patient with bloody bowel movement this morning which is improving per patient. CT abdomen and pelvis which was done shows an acute mild segmental colitis either infectious or inflammatory along the distal transverse and descending colon with mildly irregular wall thickening and associated soft tissue inflammation. No evidence of perforation or abscess formation. Continue current diet as per GI. Continue PPI. Continue to hold aspirin. GI following and appreciate input and recommendations.  #2 Hyperlipidemia LDL 91. Zocor.  #3 HTN Continue to hold antihypertensives medications secondary to borderline blood pressure. Follow.  #4 gastroesophageal reflux disease Change Pepcid to PPI. Continue sucralfate.  #5 back pain Continue Tylenol, Robaxin, oxycodone as needed.  #6 history of stroke Continue to hold aspirin secondary to problem #1. Continue risk factor modification.  #7 leukocytosis Likely reactive leukocytosis. WBC is trending down. C. difficile PCR negative. Patient with no respiratory symptoms. Continue empiric IV ciprofloxacin oral Flagyl. Follow.   #8 hypokalemia Likely secondary to GI losses. Replete.  #9 prophylaxis PPI for GI prophylaxis. SCDs for DVT prophylaxis.  Code Status: Full Family Communication: Updated patient. No family at bedside. Disposition Plan: Remain inpatient.   Consultants:  GI: Dr. Olevia Perches 09/11/2014  Procedures:  CT abdomen and pelvis 09/11/2014  Antibiotics:  IV ciprofloxacin 09/11/2014  Oral Flagyl 09/11/2014  HPI/Subjective: Patient tolerating clear liquids. Patient  states still with some bloody bowel movements but improving from admission. Patient states abdominal pain improving. Patient states whenever she eats or drinks anything she has loose stools.  Objective: Filed Vitals:   09/12/14 1541  BP: 108/73  Pulse: 73  Temp: 97.9 F (36.6 C)  Resp: 18    Intake/Output Summary (Last 24 hours) at 09/12/14 1835 Last data filed at 09/12/14 1809  Gross per 24 hour  Intake 4807.08 ml  Output   3200 ml  Net 1607.08 ml   Filed Weights   09/11/14 0410 09/11/14 0632 09/12/14 0655  Weight: 71.26 kg (157 lb 1.6 oz) 84.324 kg (185 lb 14.4 oz) 84.188 kg (185 lb 9.6 oz)    Exam:   General:  NAD  Cardiovascular: RRR  Respiratory: CTAB  Abdomen: Soft/NT/ND/+BS  Musculoskeletal: No c/c/e  Data Reviewed: Basic Metabolic Panel:  Recent Labs Lab 09/10/14 1815 09/11/14 0422 09/12/14 0540  NA 138 139 140  K 3.8 3.4* 3.8  CL 103 105 107  CO2 23 24 24   GLUCOSE 109* 94 86  BUN 14 10 6   CREATININE 1.11* 0.97 0.94  CALCIUM 9.6 8.6* 8.9   Liver Function Tests:  Recent Labs Lab 09/10/14 1815 09/11/14 0422  AST 47* 36  ALT 36 28  ALKPHOS 52 42  BILITOT 0.6 0.7  PROT 7.2 6.3*  ALBUMIN 4.0 3.2*    Recent Labs Lab 09/10/14 1815  LIPASE 27   No results for input(s): AMMONIA in the last 168 hours. CBC:  Recent Labs Lab 09/10/14 1815 09/10/14 2308 09/11/14 0422 09/11/14 1014 09/12/14 0540  WBC 13.6* 14.2* 10.6* 9.6 8.4  NEUTROABS 10.2*  --   --   --   --   HGB 15.1* 13.6 13.7 14.0 13.5  HCT 44.6 40.8 40.7 42.4 40.7  MCV 89.2 88.9 88.9 89.6 89.6  PLT 229 206  184 185 176   Cardiac Enzymes: No results for input(s): CKTOTAL, CKMB, CKMBINDEX, TROPONINI in the last 168 hours. BNP (last 3 results) No results for input(s): BNP in the last 8760 hours.  ProBNP (last 3 results) No results for input(s): PROBNP in the last 8760 hours.  CBG:  Recent Labs Lab 09/11/14 0631 09/12/14 0718  GLUCAP 79 99    Recent Results (from  the past 240 hour(s))  Clostridium Difficile by PCR     Status: None   Collection Time: 09/11/14  1:12 AM  Result Value Ref Range Status   C difficile by pcr NEGATIVE NEGATIVE Final     Studies: Ct Abdomen Pelvis W Contrast  09/11/2014   CLINICAL DATA:  Acute onset of lower abdominal pain and bloody diarrhea. Nausea and vomiting. Initial encounter.  EXAM: CT ABDOMEN AND PELVIS WITH CONTRAST  TECHNIQUE: Multidetector CT imaging of the abdomen and pelvis was performed using the standard protocol following bolus administration of intravenous contrast.  CONTRAST:  129m OMNIPAQUE IOHEXOL 300 MG/ML  SOLN  COMPARISON:  Abdominal ultrasound performed 06/23/2006  FINDINGS: Minimal right basilar scarring is noted.  The liver and spleen are unremarkable in appearance. The gallbladder is within normal limits. The pancreas and adrenal glands are unremarkable.  The kidneys are unremarkable in appearance. There is no evidence of hydronephrosis. No renal or ureteral stones are seen. No perinephric stranding is appreciated.  No free fluid is identified. The small bowel is unremarkable in appearance. The stomach is within normal limits. No acute vascular abnormalities are seen. Scattered calcification is noted along the abdominal aorta.  The appendix is normal in caliber and contains trace contrast, without evidence of appendicitis. Contrast progresses to the level of the rectum.  There is mildly irregular wall thickening noted along the distal transverse and descending colon, with minimal associated soft tissue stranding. This likely reflects mild acute segmental colitis, either infectious or inflammatory in nature. There is no evidence of ischemia. The remainder of the colon is unremarkable in appearance. There is no evidence of diverticulosis.  The bladder is mildly distended and grossly unremarkable. The uterus is within normal limits. The ovaries are relatively symmetric, though not well characterized. No suspicious  adnexal masses are seen. No inguinal lymphadenopathy is seen.  No acute osseous abnormalities are identified. Multilevel vacuum phenomenon is noted along the lower lumbar spine, with mild underlying facet disease and associated endplate sclerotic change.  IMPRESSION: 1. Mild acute segmental colitis, either infectious or inflammatory in nature, along the distal transverse and descending colon, with mildly irregular wall thickening and associated soft tissue inflammation. No evidence of perforation or abscess formation. 2. Scattered calcification along the abdominal aorta. 3. Mild diffuse degenerative change along the lower lumbar spine.   Electronically Signed   By: JGarald BaldingM.D.   On: 09/11/2014 19:21    Scheduled Meds: . atenolol  50 mg Oral Daily  . B-complex with vitamin C  1 tablet Oral Daily  . calcium-vitamin D  1 tablet Oral Q breakfast  . cholecalciferol  1,000 Units Oral Daily  . ciprofloxacin  400 mg Intravenous Q12H  . famotidine  40 mg Oral Daily  . folic acid  1 mg Oral Daily  . Melatonin  1 tablet Oral QHS  . metroNIDAZOLE  250 mg Oral TID PC  . multivitamin with minerals  1 tablet Oral Daily  . pyridOXINE  100 mg Oral Daily  . simvastatin  20 mg Oral q1800  . sodium chloride  3  mL Intravenous Q12H   Continuous Infusions: . sodium chloride 125 mL/hr at 09/12/14 0807    Principal Problem:   Segmental colitis with rectal bleeding Active Problems:   Hyperlipidemia   Essential hypertension   GASTROESOPHAGEAL REFLUX DISEASE   Osteoarthritis   GIB (gastrointestinal bleeding)   GI bleed   Bloody diarrhea   Abnormal abdominal CT scan    Time spent: Kirkwood MD [facility] Pager [phone]. If 7PM-7AM, please contact night-coverage at [email or web address], password [facility] 09/12/2014, 6:35 PM  LOS: 2 days

## 2014-09-12 NOTE — Progress Notes (Signed)
Daily Rounding Note  09/12/2014, 8:26 AM  LOS: 2 days   SUBJECTIVE:       Still some blood in the stool which continues to be loose.  Abdominal pain, mostly arouond time of BMs, better but not resolved. Tolerating clears but po intake does trigger BMs.   A couple of small spots on her right forearm have appeared as of this AM and are itching.  OBJECTIVE:         Vital signs in last 24 hours:    Temp:  [98.4 F (36.9 C)-98.6 F (37 C)] 98.6 F (37 C) (06/03 0655) Pulse Rate:  [68-77] 68 (06/03 0655) Resp:  [18] 18 (06/03 0655) BP: (89-103)/(48-67) 94/48 mmHg (06/03 0655) SpO2:  [95 %-97 %] 95 % (06/03 0655) Weight:  [185 lb 9.6 oz (84.188 kg)] 185 lb 9.6 oz (84.188 kg) (06/03 0655) Last BM Date: 09/11/14 Filed Weights   09/11/14 0410 09/11/14 6333 09/12/14 0655  Weight: 157 lb 1.6 oz (71.26 kg) 185 lb 14.4 oz (84.324 kg) 185 lb 9.6 oz (84.188 kg)   General: looks well.  comfortable   Heart: RRR Chest: clear bil.  No cough or dyspnea Abdomen: soft, minor left sided tenderness Derm:  Tiny, isolated, red, raised lesions x 2 on right forearm but does not look like drug reaction.   Extremities: no CCE Neuro/Psych:  Pleasant, relaxed.   Intake/Output from previous day: 06/02 0701 - 06/03 0700 In: 4330 [P.O.:1660; I.V.:2470; IV Piggyback:200] Out: 5456 [Urine:4550; Stool:2]  Intake/Output this shift:    Lab Results:  Recent Labs  09/11/14 0422 09/11/14 1014 09/12/14 0540  WBC 10.6* 9.6 8.4  HGB 13.7 14.0 13.5  HCT 40.7 42.4 40.7  PLT 184 185 176   BMET  Recent Labs  09/10/14 1815 09/11/14 0422 09/12/14 0540  NA 138 139 140  K 3.8 3.4* 3.8  CL 103 105 107  CO2 23 24 24   GLUCOSE 109* 94 86  BUN 14 10 6   CREATININE 1.11* 0.97 0.94  CALCIUM 9.6 8.6* 8.9   LFT  Recent Labs  09/10/14 1815 09/11/14 0422  PROT 7.2 6.3*  ALBUMIN 4.0 3.2*  AST 47* 36  ALT 36 28  ALKPHOS 52 42  BILITOT 0.6 0.7    PT/INR  Recent Labs  09/10/14 1958  LABPROT 14.4  INR 1.10   Hepatitis Panel No results for input(s): HEPBSAG, HCVAB, HEPAIGM, HEPBIGM in the last 72 hours.  Studies/Results: Ct Abdomen Pelvis W Contrast  09/11/2014   CLINICAL DATA:  Acute onset of lower abdominal pain and bloody diarrhea. Nausea and vomiting. Initial encounter.  EXAM: CT ABDOMEN AND PELVIS WITH CONTRAST  TECHNIQUE: Multidetector CT imaging of the abdomen and pelvis was performed using the standard protocol following bolus administration of intravenous contrast.  CONTRAST:  143m OMNIPAQUE IOHEXOL 300 MG/ML  SOLN  COMPARISON:  Abdominal ultrasound performed 06/23/2006  FINDINGS: Minimal right basilar scarring is noted.  The liver and spleen are unremarkable in appearance. The gallbladder is within normal limits. The pancreas and adrenal glands are unremarkable.  The kidneys are unremarkable in appearance. There is no evidence of hydronephrosis. No renal or ureteral stones are seen. No perinephric stranding is appreciated.  No free fluid is identified. The small bowel is unremarkable in appearance. The stomach is within normal limits. No acute vascular abnormalities are seen. Scattered calcification is noted along the abdominal aorta.  The appendix is normal in caliber and contains trace contrast, without  evidence of appendicitis. Contrast progresses to the level of the rectum.  There is mildly irregular wall thickening noted along the distal transverse and descending colon, with minimal associated soft tissue stranding. This likely reflects mild acute segmental colitis, either infectious or inflammatory in nature. There is no evidence of ischemia. The remainder of the colon is unremarkable in appearance. There is no evidence of diverticulosis.  The bladder is mildly distended and grossly unremarkable. The uterus is within normal limits. The ovaries are relatively symmetric, though not well characterized. No suspicious adnexal masses  are seen. No inguinal lymphadenopathy is seen.  No acute osseous abnormalities are identified. Multilevel vacuum phenomenon is noted along the lower lumbar spine, with mild underlying facet disease and associated endplate sclerotic change.  IMPRESSION: 1. Mild acute segmental colitis, either infectious or inflammatory in nature, along the distal transverse and descending colon, with mildly irregular wall thickening and associated soft tissue inflammation. No evidence of perforation or abscess formation. 2. Scattered calcification along the abdominal aorta. 3. Mild diffuse degenerative change along the lower lumbar spine.   Electronically Signed   By: [name] M.D.   On: 09/11/2014 19:21   Scheduled Meds: . atenolol  50 mg Oral Daily  . B-complex with vitamin C  1 tablet Oral Daily  . calcium-vitamin D  1 tablet Oral Q breakfast  . cholecalciferol  1,000 Units Oral Daily  . ciprofloxacin  400 mg Intravenous Q12H  . famotidine  40 mg Oral Daily  . folic acid  1 mg Oral Daily  . Melatonin  1 tablet Oral QHS  . metroNIDAZOLE  250 mg Oral TID PC  . multivitamin with minerals  1 tablet Oral Daily  . pyridOXINE  100 mg Oral Daily  . simvastatin  20 mg Oral q1800  . sodium chloride  3 mL Intravenous Q12H   Continuous Infusions: . sodium chloride 125 mL/hr at 09/12/14 0807   PRN Meds:.acetaminophen **OR** acetaminophen, hydrALAZINE, methocarbamol, ondansetron, oxyCODONE, sodium chloride   ASSESMENT:   * Bloody stool, hematochezia with some intermittent left sided abd pain. Left sided segmental colitis. Cdiff negative, fecal lactoferrin +.  ? Infectious vs ischemic etiology.  Po cipro/flagyl started 6/2.  Clinically better.   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 No polyps 11/2012, due rescope in 11/2017  * Mild GERD in 2006. No regular GI meds. Now on Protonix IV and carafate.     PLAN   *  7 days abx but watch for more widespread itching/rash and d/c abx if  drug reaction suspected.   *  Advance diet to soft.    *  ? Home later today if doing well and tolerates soft diet.       Azucena Freed  09/12/2014, 8:26 AM Pager: [phone] Attending MD note:   I have taken a history, examined the patient, and reviewed the chart. I agree with the Advanced Practitioner's impression and recommendations.  Acute colitis, she is feeling better today, diarrhea has eased oiff, tolerated soft food. Stool lactoferin positive. Continue Flagy and Cipro, awaiting stoo cultures.  [facility] Pager # [phone]

## 2014-09-13 LAB — BASIC METABOLIC PANEL
ANION GAP: 8 (ref 5–15)
BUN: 9 mg/dL (ref 6–20)
CO2: 26 mmol/L (ref 22–32)
Calcium: 8.9 mg/dL (ref 8.9–10.3)
Chloride: 108 mmol/L (ref 101–111)
Creatinine, Ser: 1.12 mg/dL — ABNORMAL HIGH (ref 0.44–1.00)
GFR calc Af Amer: 59 mL/min — ABNORMAL LOW (ref >60)
GFR, EST NON AFRICAN AMERICAN: 51 mL/min — AB (ref >60)
Glucose, Bld: 88 mg/dL (ref 65–99)
Potassium: 3.9 mmol/L (ref 3.5–5.1)
Sodium: 142 mmol/L (ref 135–145)

## 2014-09-13 LAB — CBC
HCT: 39.4 % (ref 36.0–46.0)
Hemoglobin: 13.2 g/dL (ref 12.0–15.0)
MCH: 29.9 pg (ref 26.0–34.0)
MCHC: 33.5 g/dL (ref 30.0–36.0)
MCV: 89.1 fL (ref 78.0–100.0)
Platelets: 178 10*3/uL (ref 150–400)
RBC: 4.42 MIL/uL (ref 3.87–5.11)
RDW: 13.8 % (ref 11.5–15.5)
WBC: 8.7 10*3/uL (ref 4.0–10.5)

## 2014-09-13 LAB — MAGNESIUM: Magnesium: 1.5 mg/dL — ABNORMAL LOW (ref 1.7–2.4)

## 2014-09-13 LAB — GLUCOSE, CAPILLARY: Glucose-Capillary: 90 mg/dL (ref 65–99)

## 2014-09-13 MED ORDER — MAGNESIUM SULFATE 50 % IJ SOLN
3.0000 g | Freq: Once | INTRAVENOUS | Status: AC
  Administered 2014-09-13: 3 g via INTRAVENOUS
  Filled 2014-09-13: qty 6

## 2014-09-13 MED ORDER — OXYCODONE-ACETAMINOPHEN 5-325 MG PO TABS: 1.0000 | ORAL_TABLET | ORAL | Status: DC | PRN

## 2014-09-13 MED ORDER — CIPROFLOXACIN HCL 250 MG PO TABS: 250.0000 mg | ORAL_TABLET | Freq: Two times a day (BID) | ORAL | Status: AC

## 2014-09-13 MED ORDER — ASPIRIN 81 MG PO TABS: 81.0000 mg | ORAL_TABLET | Freq: Two times a day (BID) | ORAL | Status: DC

## 2014-09-13 MED ORDER — METRONIDAZOLE 250 MG PO TABS: 250.0000 mg | ORAL_TABLET | Freq: Three times a day (TID) | ORAL | Status: AC

## 2014-09-13 MED ORDER — FAMOTIDINE 40 MG PO TABS: 40.0000 mg | ORAL_TABLET | Freq: Every day | ORAL | Status: DC

## 2014-09-13 MED ORDER — LISINOPRIL 2.5 MG PO TABS: 2.5000 mg | ORAL_TABLET | Freq: Every day | ORAL | Status: DC

## 2014-09-13 MED ORDER — CIPROFLOXACIN HCL 250 MG PO TABS
250.0000 mg | ORAL_TABLET | Freq: Two times a day (BID) | ORAL | Status: DC
  Filled 2014-09-13 (×3): qty 1

## 2014-09-13 NOTE — Discharge Summary (Signed)
Physician Discharge Summary  [name] XLK:[id number] DOB: [birth year] DOA: 09/10/2014  PCP: Olga Millers, MD  Admit date: 09/10/2014 Discharge date: 09/13/2014  Time spent: 65 minutes  Recommendations for Outpatient Follow-up:  1. Follow-up with Olga Millers, MD A1 to 2 weeks. On follow-up patient's blood pressure regimen needs to be reassessed as her diuretics have been discontinued. Patient need a CBC done to follow-up on H&H. Patient need a basic metabolic profile done to follow-up on electrolytes and renal function.  Discharge Diagnoses:  Principal Problem:   Segmental colitis with rectal bleeding Active Problems:   Hyperlipidemia   Essential hypertension   GASTROESOPHAGEAL REFLUX DISEASE   Osteoarthritis   GIB (gastrointestinal bleeding)   GI bleed   Bloody diarrhea   Abnormal abdominal CT scan   Discharge Condition: Stable and improved  Diet recommendation: Gluten-free diet  Filed Weights   09/11/14 3664 09/12/14 0655 09/13/14 0444  Weight: 84.324 kg (185 lb 14.4 oz) 84.188 kg (185 lb 9.6 oz) 85.14 kg (187 lb 11.2 oz)    History of present illness:  Per Dr Leontine Locket is a 64 y.o. female with PMH of history of polyps of the colon, hypertension, hyperlipidemia, GERD, overweight, history of stroke, history of chronic back pain.  Patient reported that she started having nausea and vomiting with brown colored stomach contents 3 days prior to admission. She denied any vomiting of blood. She had very little to eat for 2 days prior to admission, and then on day of admission she started to have multiple episodes of bloody bowel movements. She reported the bloody stool was dark red and some bright red in color. It was associated with abdominal cramping. She had never had this problem before. Of note, she takes 2 baby aspirin's every day because of her history of strokes, over the last couple of weeks she had been using lots of ibuprofen because of knee pain  and back pain. She had chills, but not fever. Feeling muffled ears, without ear pain or discharge.  On admission, patient denied running nose, headaches, cough, chest pain, SOB, dysuria, urgency, frequency, hematuria, skin rashes or leg swelling. No unilateral weakness, numbness or tingling sensations. No vision change or hearing loss.  In ED, patient was found to have WBC 13.6, hemoglobin 15.1, normal temperature, no tachycardia, INR 1.1, PTT 28. Patient was admitted to inpatient for further evaluation and treatment.  Hospital Course:  #1 segmental colitis would rectal bleed /GI bleed Patient had presented with rectal bleeding/GI bleed. Patient was initially made nothing by mouth and GI consultation obtained. CT of the abdomen and pelvis was obtained which was consistent with an acute mild segmental colitis involving the distal transverse and descending colon. There is concern for possible ischemic colitis versus infectious colitis. Patient was initially placed empirically on IV ciprofloxacin and oral Flagyl followed by GI throughout the hospitalization. C. difficile PCR was negative. Hemoglobin currently stabilized at 13.5 from 14 from 15.1 on admission. Patient's bloody bowel movements improved during the hospitalization such that by day of discharge they had resolved. Patient's antihypertensives medications were held throughout the hospitalization. CT abdomen and pelvis which was done showed an acute mild segmental colitis either infectious or inflammatory along the distal transverse and descending colon with mildly irregular wall thickening and associated soft tissue inflammation. No evidence of perforation or abscess formation. Patient's aspirin was held. Patient be discharged home in stable and improved condition on a few more days of oral antibiotics.   #2  Hyperlipidemia LDL 91. Patient was maintained on a statin.   #3 HTN On admission due to patient's borderline blood pressure and bleeding and  due to concerns for ischemic colitis patient's antihypertensives medications were held. Patient's blood pressure remained stable with hydration. Hydration was discontinued and patient blood pressure remained stable. Patient's diuretics have been discontinued from her discharge regimen and patient will resume her antihypertensives medications 2-3 days post discharge.   #4 gastroesophageal reflux disease Patient was initially placed on Pepcid and subsequently transitioned to a PPI which she tolerated. Patient was also maintained on sucralfate.   #5 back pain Patient was placed on Tylenol, Robaxin, oxycodone as needed.  #6 history of stroke Patient's aspirin was held secondary to GI bleed. Patient is to resume aspirin in 1-2 weeks. Outpatient follow-up.  #7 leukocytosis Likely reactive leukocytosis. WBC trended down during the hospitalization. C. difficile PCR negative. Patient with no respiratory symptoms. Patient had been placed empirically on IV ciprofloxacin and oral Flagyl on admission on the discharge in a few more days.   #8 hypokalemia Likely secondary to GI losses. Repleted.   Procedures:  CT abdomen and pelvis 09/11/2014  Consultations:  GI: Dr. Olevia Perches 09/11/2014  Discharge Exam: Filed Vitals:   09/13/14 0930  BP: 100/62  Pulse:   Temp:   Resp:     General: NAD Cardiovascular: RRR Respiratory: CTAB  Discharge Instructions   Discharge Instructions    Diet general    Complete by:  As directed   Glutein free diet     Discharge instructions    Complete by:  As directed   No NSAID use. Follow up with Olga Millers, MD in 1-2 weeks. May resume aspirin in 1-2 weeks.     Increase activity slowly    Complete by:  As directed           Current Discharge Medication List    START taking these medications   Details  ciprofloxacin (CIPRO) 250 MG tablet Take 1 tablet (250 mg total) by mouth 2 (two) times daily. Take for 5 days then stop. Qty: 10 tablet,  Refills: 0    metroNIDAZOLE (FLAGYL) 250 MG tablet Take 1 tablet (250 mg total) by mouth 3 (three) times daily after meals. Take for 5 days then stop. Qty: 15 tablet, Refills: 0      CONTINUE these medications which have CHANGED   Details  aspirin 81 MG tablet Take 1 tablet (81 mg total) by mouth 2 (two) times daily. Resume in 1 week.    lisinopril (PRINIVIL,ZESTRIL) 2.5 MG tablet Take 1 tablet (2.5 mg total) by mouth at bedtime. Resume in 2-3 days Qty: 90 tablet, Refills: 3    oxyCODONE-acetaminophen (ROXICET) 5-325 MG per tablet Take 1-2 tablets by mouth every 4 (four) hours as needed. Qty: 30 tablet, Refills: 0      CONTINUE these medications which have NOT CHANGED   Details  b complex vitamins tablet Take 1 tablet by mouth daily.    Calcium Carbonate-Vitamin D (CALCIUM + D PO) Take 1 tablet by mouth daily.     cholecalciferol (VITAMIN D) 1000 UNITS tablet Take 1,000 Units by mouth daily.      Chromium 1 MG CAPS Take 1 mg by mouth daily.     co-enzyme Q-10 30 MG capsule Take 30 mg by mouth 2 (two) times daily.     folic acid (FOLVITE) 841 MCG tablet Take 400 mcg by mouth daily.    Magnesium 100 MG CAPS Take 100 mg  by mouth at bedtime.     MELATONIN ER PO Take 1 tablet by mouth at bedtime.     methocarbamol (ROBAXIN) 500 MG tablet Take 1 tablet (500 mg total) by mouth every 6 (six) hours as needed for muscle spasms (spasm). Qty: 30 tablet, Refills: 0    Misc Natural Products (OSTEO BI-FLEX JOINT SHIELD PO) Take 1 tablet by mouth daily.     Multiple Vitamin (MULTIVITAMIN) tablet Take 1 tablet by mouth daily.      potassium chloride SA (KLOR-CON M20) 20 MEQ tablet Take 1 tablet (20 mEq total) by mouth 2 (two) times daily. Qty: 180 tablet, Refills: 3    pyridOXINE (VITAMIN B-6) 100 MG tablet Take 100 mg by mouth daily.    simvastatin (ZOCOR) 20 MG tablet Take 1 tablet (20 mg total) by mouth daily. Qty: 90 tablet, Refills: 3    sodium chloride (OCEAN) 0.65 % SOLN nasal  spray Place 1 spray into both nostrils as needed for congestion.    Zinc Sulfate (ZINC 15 PO) Take 1 tablet by mouth daily.       STOP taking these medications     atenolol-chlorthalidone (TENORETIC) 50-25 MG per tablet        Allergies  Allergen Reactions  . Eggs Or Egg-Derived Products Other (See Comments)    GI UPSET, JOINT PAIN, DIFF. SWALLOWING  . Etodolac Other (See Comments)    ABD. CRAMPING  . Fish-Derived Products Other (See Comments)    GI UPSET, JOINT PAIN, DIFF. SWALLOWING   . Omeprazole Other (See Comments)    ABD. CRAMPING  . Pineapple Other (See Comments)    GI UPSET, JOINT PAIN, DIFF. SWALLOWING   Follow-up Information    Follow up with Olga Millers, MD. Call in 1 week.   Specialty:  Internal Medicine   Why:  f/u in 1-2 weeks   Contact information:   [place] [address] [phone]        The results of significant diagnostics from this hospitalization (including imaging, microbiology, ancillary and laboratory) are listed below for reference.    Significant Diagnostic Studies: Ct Abdomen Pelvis W Contrast  09/11/2014   CLINICAL DATA:  Acute onset of lower abdominal pain and bloody diarrhea. Nausea and vomiting. Initial encounter.  EXAM: CT ABDOMEN AND PELVIS WITH CONTRAST  TECHNIQUE: Multidetector CT imaging of the abdomen and pelvis was performed using the standard protocol following bolus administration of intravenous contrast.  CONTRAST:  148m OMNIPAQUE IOHEXOL 300 MG/ML  SOLN  COMPARISON:  Abdominal ultrasound performed 06/23/2006  FINDINGS: Minimal right basilar scarring is noted.  The liver and spleen are unremarkable in appearance. The gallbladder is within normal limits. The pancreas and adrenal glands are unremarkable.  The kidneys are unremarkable in appearance. There is no evidence of hydronephrosis. No renal or ureteral stones are seen. No perinephric stranding is appreciated.  No free fluid is identified. The small  bowel is unremarkable in appearance. The stomach is within normal limits. No acute vascular abnormalities are seen. Scattered calcification is noted along the abdominal aorta.  The appendix is normal in caliber and contains trace contrast, without evidence of appendicitis. Contrast progresses to the level of the rectum.  There is mildly irregular wall thickening noted along the distal transverse and descending colon, with minimal associated soft tissue stranding. This likely reflects mild acute segmental colitis, either infectious or inflammatory in nature. There is no evidence of ischemia. The remainder of the colon is unremarkable in appearance. There  is no evidence of diverticulosis.  The bladder is mildly distended and grossly unremarkable. The uterus is within normal limits. The ovaries are relatively symmetric, though not well characterized. No suspicious adnexal masses are seen. No inguinal lymphadenopathy is seen.  No acute osseous abnormalities are identified. Multilevel vacuum phenomenon is noted along the lower lumbar spine, with mild underlying facet disease and associated endplate sclerotic change.  IMPRESSION: 1. Mild acute segmental colitis, either infectious or inflammatory in nature, along the distal transverse and descending colon, with mildly irregular wall thickening and associated soft tissue inflammation. No evidence of perforation or abscess formation. 2. Scattered calcification along the abdominal aorta. 3. Mild diffuse degenerative change along the lower lumbar spine.   Electronically Signed   By: [name] M.D.   On: 09/11/2014 19:21    Microbiology: Recent Results (from the past 240 hour(s))  Clostridium Difficile by PCR     Status: None   Collection Time: 09/11/14  1:12 AM  Result Value Ref Range Status   C difficile by pcr NEGATIVE NEGATIVE Final     Labs: Basic Metabolic Panel:  Recent Labs Lab 09/10/14 1815 09/11/14 0422 09/12/14 0540 09/13/14 0406  NA 138 139  140 142  K 3.8 3.4* 3.8 3.9  CL 103 105 107 108  CO2 23 24 24 26   GLUCOSE 109* 94 86 88  BUN 14 10 6 9   CREATININE 1.11* 0.97 0.94 1.12*  CALCIUM 9.6 8.6* 8.9 8.9  MG  --   --   --  1.5*   Liver Function Tests:  Recent Labs Lab 09/10/14 1815 09/11/14 0422  AST 47* 36  ALT 36 28  ALKPHOS 52 42  BILITOT 0.6 0.7  PROT 7.2 6.3*  ALBUMIN 4.0 3.2*    Recent Labs Lab 09/10/14 1815  LIPASE 27   No results for input(s): AMMONIA in the last 168 hours. CBC:  Recent Labs Lab 09/10/14 1815 09/10/14 2308 09/11/14 0422 09/11/14 1014 09/12/14 0540 09/13/14 0406  WBC 13.6* 14.2* 10.6* 9.6 8.4 8.7  NEUTROABS 10.2*  --   --   --   --   --   HGB 15.1* 13.6 13.7 14.0 13.5 13.2  HCT 44.6 40.8 40.7 42.4 40.7 39.4  MCV 89.2 88.9 88.9 89.6 89.6 89.1  PLT 229 206 184 185 176 178   Cardiac Enzymes: No results for input(s): CKTOTAL, CKMB, CKMBINDEX, TROPONINI in the last 168 hours. BNP: BNP (last 3 results) No results for input(s): BNP in the last 8760 hours.  ProBNP (last 3 results) No results for input(s): PROBNP in the last 8760 hours.  CBG:  Recent Labs Lab 09/11/14 0631 09/12/14 0718 09/13/14 0738  GLUCAP 79 99 90       Signed:  Asli Tokarski M.D. [facility] 09/13/2014, 12:23 PM

## 2014-09-13 NOTE — Progress Notes (Signed)
Daily Rounding Note  09/13/2014, 8:07 AM  LOS: 3 days   SUBJECTIVE:       Last stool was last night, still loose, no blood.  Abdominal pain resolved.  Tolerating soft diet.  pain in left leg persists  OBJECTIVE:         Vital signs in last 24 hours:    Temp:  [97 F (36.1 C)-97.9 F (36.6 C)] 97.8 F (36.6 C) (06/04 0444) Pulse Rate:  [62-73] 62 (06/04 0444) Resp:  [18] 18 (06/04 0444) BP: (103-117)/(73-76) 117/74 mmHg (06/03 2041) SpO2:  [97 %-99 %] 97 % (06/04 0444) Weight:  [187 lb 11.2 oz (85.14 kg)] 187 lb 11.2 oz (85.14 kg) (06/04 0444) Last BM Date: 09/12/14 Filed Weights   09/11/14 0881 09/12/14 0655 09/13/14 0444  Weight: 185 lb 14.4 oz (84.324 kg) 185 lb 9.6 oz (84.188 kg) 187 lb 11.2 oz (85.14 kg)   General: looks well   Heart: RRR Chest: clear bil Abdomen: soft, NT, ND.  Active BS  Extremities: no CCE Neuro/Psych:  Pleasant, anxious, talkative, no gross deficits.   Intake/Output from previous day: 06/03 0701 - 06/04 0700 In: 4710.1 [P.O.:1238; I.V.:3072.1; IV Piggyback:400] Out: 2600 [Urine:2600]  Intake/Output this shift:    Lab Results:  Recent Labs  09/11/14 1014 09/12/14 0540 09/13/14 0406  WBC 9.6 8.4 8.7  HGB 14.0 13.5 13.2  HCT 42.4 40.7 39.4  PLT 185 176 178   BMET  Recent Labs  09/11/14 0422 09/12/14 0540 09/13/14 0406  NA 139 140 142  K 3.4* 3.8 3.9  CL 105 107 108  CO2 24 24 26   GLUCOSE 94 86 88  BUN 10 6 9   CREATININE 0.97 0.94 1.12*  CALCIUM 8.6* 8.9 8.9   LFT  Recent Labs  09/10/14 1815 09/11/14 0422  PROT 7.2 6.3*  ALBUMIN 4.0 3.2*  AST 47* 36  ALT 36 28  ALKPHOS 52 42  BILITOT 0.6 0.7   PT/INR  Recent Labs  09/10/14 1958  LABPROT 14.4  INR 1.10   Hepatitis Panel No results for input(s): HEPBSAG, HCVAB, HEPAIGM, HEPBIGM in the last 72 hours.  Studies/Results: Ct Abdomen Pelvis W Contrast  09/11/2014   CLINICAL DATA:  Acute onset of lower  abdominal pain and bloody diarrhea. Nausea and vomiting. Initial encounter.  EXAM: CT ABDOMEN AND PELVIS WITH CONTRAST  TECHNIQUE: Multidetector CT imaging of the abdomen and pelvis was performed using the standard protocol following bolus administration of intravenous contrast.  CONTRAST:  192m OMNIPAQUE IOHEXOL 300 MG/ML  SOLN  COMPARISON:  Abdominal ultrasound performed 06/23/2006  FINDINGS: Minimal right basilar scarring is noted.  The liver and spleen are unremarkable in appearance. The gallbladder is within normal limits. The pancreas and adrenal glands are unremarkable.  The kidneys are unremarkable in appearance. There is no evidence of hydronephrosis. No renal or ureteral stones are seen. No perinephric stranding is appreciated.  No free fluid is identified. The small bowel is unremarkable in appearance. The stomach is within normal limits. No acute vascular abnormalities are seen. Scattered calcification is noted along the abdominal aorta.  The appendix is normal in caliber and contains trace contrast, without evidence of appendicitis. Contrast progresses to the level of the rectum.  There is mildly irregular wall thickening noted along the distal transverse and descending colon, with minimal associated soft tissue stranding. This likely reflects mild acute segmental colitis, either infectious or inflammatory in nature. There is no evidence of ischemia.  The remainder of the colon is unremarkable in appearance. There is no evidence of diverticulosis.  The bladder is mildly distended and grossly unremarkable. The uterus is within normal limits. The ovaries are relatively symmetric, though not well characterized. No suspicious adnexal masses are seen. No inguinal lymphadenopathy is seen.  No acute osseous abnormalities are identified. Multilevel vacuum phenomenon is noted along the lower lumbar spine, with mild underlying facet disease and associated endplate sclerotic change.  IMPRESSION: 1. Mild acute  segmental colitis, either infectious or inflammatory in nature, along the distal transverse and descending colon, with mildly irregular wall thickening and associated soft tissue inflammation. No evidence of perforation or abscess formation. 2. Scattered calcification along the abdominal aorta. 3. Mild diffuse degenerative change along the lower lumbar spine.   Electronically Signed   By: [name] M.D.   On: 09/11/2014 19:21    ASSESMENT:   * Bloody stool, hematochezia, intermittent left sided abd pain. Left sided segmental colitis. Cdiff negative, fecal lactoferrin +, stoool clx in process. ? Infectious vs ischemic etiology. IV cipro/PO flagyl started 6/2.Hgb stable, leukocytosis resolved.   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 No polyps 11/2012, due rescope in 11/2017  * Mild GERD in 2006. No regular GI meds. Now on Protonix IV and carafate.   *  Hypomagnesemia:  Getting IV mag.     PLAN   *  PO Cipro/Flagyl to complete total of 7 days.  *  Ok to discharge.  If Dr Olevia Perches wants GI follow up will arrange an appointment and call pt.  Dr Fuller Plan is her GI  *  Gluten free diet per pt request (not a celiac pt however)    Azucena Freed  09/13/2014, 8:07 AM Pager: [phone] Attending MD note:   I have taken a history,  and reviewed the chart. I agree with the Advanced Practitioner's impression and recommendations. Stool for Pathogens so far negative.  [facility] Pager # [phone]

## 2014-09-15 LAB — STOOL CULTURE

## 2014-09-16 ENCOUNTER — Telehealth: Payer: Self-pay | Admitting: *Deleted

## 2014-09-16 NOTE — Progress Notes (Signed)
[name] of [facility] Called requesting concurrent clinical from 09/12/2014 ASAP.       [place]!!!

## 2014-09-16 NOTE — Telephone Encounter (Signed)
[name] Patient Name:  Males Gender: Female DOB: 10/28/1944 Age: 64 Y 10 M 12 D Return Phone Number: [phone] (Primary) Address: City/State/Zip: [place] [address] Client [name] - Client Client Site [facility] - Day Physician Gwendolyn Grant Contact Type Call Call Type Triage / Clinical Relationship To Patient Self Appointment Disposition EMR Appointment Not Necessary Info pasted into Epic Yes Return Phone Number [phone] (Primary) Chief Complaint Shoulder Injury Initial Comment Caller States has pain in her right shoulder blade on the back. needs to know how to treat it. current BP 97/66 PreDisposition InappropriateToAsk Nurse Assessment Nurse: Martyn Ehrich, RN, Felicia Date/Time (Eastern Time): 09/10/2014 3:16:39 PM Confirm and document reason for call. If symptomatic, describe symptoms. ---L shoulder started hurting after she got up this am. No injury. BP is 97/66 noted just in last hour. She takes BP med and usually it is high on BP med Has the patient traveled out of the country within the last 30 days? ---No Does the patient require triage? ---Yes Related visit to physician within the last 2 weeks? ---No Does the PT have any chronic conditions? (i.e. diabetes, asthma, etc.) ---Yes List chronic conditions. ---HTN Guidelines Guideline Title Affirmed Question Affirmed Notes Nurse Date/Time Eilene Ghazi Time) Chest Pain [1] Chest pain lasts > 5 minutes AND [2] age > 64 Martyn Ehrich, RN, Felicia 12/11/1192 1:74:08 PM Disp. Time Eilene Ghazi Time) Disposition Final User 09/10/2014 3:30:56 PM 911 Outcome Documentation Gaddy, RN, Solmon Ice Reason: Pt declined to call 911. Husband is going to take her to ER 09/10/2014 3:31:35 PM Call Completed Berta Minor 04/14/4816 5:63:14 PM Call EMS 911 Now Yes Gaddy, RN, Solmon Ice PLEASE NOTE: All timestamps  contained within this report are represented as [place] Standard Time. CONFIDENTIALTY NOTICE: This fax transmission is intended only for the addressee. It contains information that is legally privileged, confidential or otherwise protected from use or disclosure. If you are not the intended recipient, you are strictly prohibited from reviewing, disclosing, copying using or disseminating any of this information or taking any action in reliance on or regarding this information. If you have received this fax in error, please notify us immediately by telephone so that we can arrange for its return to [place]. Phone: [phone], Toll-Free: [phone], Fax: [phone] Page: 2 of 2 Call Id: [id number] Caller Understands: Yes Disagree/Comply: Comply Care Advice Given Per Guideline CALL EMS 911 NOW: Immediate medical attention is needed. You need to hang up and call 911 (or an ambulance). ([facility] Discretion: I'll call you back in a few minutes to be sure you were able to reach them.) After Care Instructions Given Call Event Type User Date / Time Description Comments User: Daphene Calamity, RN Date/Time Eilene Ghazi Time): 09/10/2014 3:22:53 PM Cell phone no. [phone]

## 2014-09-25 ENCOUNTER — Encounter: Payer: Self-pay | Admitting: Internal Medicine

## 2014-09-25 ENCOUNTER — Ambulatory Visit (INDEPENDENT_AMBULATORY_CARE_PROVIDER_SITE_OTHER): Payer: No Typology Code available for payment source | Admitting: Internal Medicine

## 2014-09-25 ENCOUNTER — Other Ambulatory Visit: Payer: No Typology Code available for payment source

## 2014-09-25 VITALS — BP 116/76 | HR 72 | Temp 97.8°F | Resp 12 | Ht 62.0 in | Wt 183.1 lb

## 2014-09-25 DIAGNOSIS — A09 Infectious gastroenteritis and colitis, unspecified: Secondary | ICD-10-CM | POA: Diagnosis not present

## 2014-09-25 DIAGNOSIS — K50111 Crohn's disease of large intestine with rectal bleeding: Secondary | ICD-10-CM

## 2014-09-25 DIAGNOSIS — R197 Diarrhea, unspecified: Secondary | ICD-10-CM

## 2014-09-25 DIAGNOSIS — H6091 Unspecified otitis externa, right ear: Secondary | ICD-10-CM | POA: Diagnosis not present

## 2014-09-25 MED ORDER — NEOMYCIN-POLYMYXIN-HC 1 % OT SOLN: 3.0000 [drp] | Freq: Three times a day (TID) | OTIC | Status: DC

## 2014-09-25 NOTE — Patient Instructions (Addendum)
Try imodium for the stools to give your body time to reset. It has lost all the good bacteria with the recent antibiotics. Keep taking the probiotics. Take 1 imodium daily for the next 3 days to see if this helps. If you get constipated stop using the imodium.   We have sent in corticosporin drops for the ear. Use 3 drops 3 times a day for the next week to help with the pain.

## 2014-09-25 NOTE — Progress Notes (Signed)
Pre visit review using our clinic review tool, if applicable. No additional management support is needed unless otherwise documented below in the visit note. 

## 2014-09-26 LAB — CBC
HEMATOCRIT: 46.2 % — AB (ref 36.0–46.0)
Hemoglobin: 15.4 g/dL — ABNORMAL HIGH (ref 12.0–15.0)
MCH: 29.7 pg (ref 26.0–34.0)
MCHC: 33.3 g/dL (ref 30.0–36.0)
MCV: 89.2 fL (ref 78.0–100.0)
MPV: 10.6 fL (ref 8.6–12.4)
Platelets: 281 10*3/uL (ref 150–400)
RBC: 5.18 MIL/uL — ABNORMAL HIGH (ref 3.87–5.11)
RDW: 13.7 % (ref 11.5–15.5)
WBC: 9.1 10*3/uL (ref 4.0–10.5)

## 2014-09-26 LAB — BASIC METABOLIC PANEL
BUN: 12 mg/dL (ref 6–23)
CO2: 27 mEq/L (ref 19–32)
Calcium: 9.6 mg/dL (ref 8.4–10.5)
Chloride: 100 mEq/L (ref 96–112)
Creat: 0.94 mg/dL (ref 0.50–1.10)
GLUCOSE: 85 mg/dL (ref 70–99)
POTASSIUM: 4.2 meq/L (ref 3.5–5.3)
SODIUM: 141 meq/L (ref 135–145)

## 2014-09-27 DIAGNOSIS — H6091 Unspecified otitis externa, right ear: Secondary | ICD-10-CM | POA: Insufficient documentation

## 2014-09-27 NOTE — Assessment & Plan Note (Signed)
Corticosporin drops for her right ear.

## 2014-09-27 NOTE — Assessment & Plan Note (Signed)
Healing with antibiotics. Bleeding has stopped. Rechecking CBC and CMP. Still with some loose stools which are improving gradually. Can keep using probiotics and use imodium with caution. Stop if constipation. Talked to her about the reflex with eating that may be stronger for some months as her body readjusts.

## 2014-09-27 NOTE — Progress Notes (Signed)
   Subjective:    Patient ID: [name], female    DOB: 01-Jun-1950, 64 y.o.   MRN: [id number]  HPI The patient is a 64 YO female coming in for hospital follow up (in for blood in stools and segmental colitis, treated with antibiotics). She has finished the antibiotics and although she is not having any more blood in her stools she is still having loose stools. Overall less water than in the hospital. Still reflex to go after eating. Appetite returning. She is taking probiotics but not sure it is helping. No falls since being home and feels steady on her feet. She is also having a cough which is bothering her. Also ear pain in right ear. No discharge. No decreased hearing but moments when it feels fully clogged.   Review of Systems  HENT: Positive for congestion, ear pain and rhinorrhea. Negative for ear discharge, postnasal drip, sinus pressure, sneezing, sore throat and trouble swallowing.   Eyes: Negative.   Respiratory: Positive for cough. Negative for chest tightness, shortness of breath and wheezing.   Cardiovascular: Negative for chest pain, palpitations and leg swelling.  Gastrointestinal: Positive for diarrhea. Negative for abdominal pain, constipation, blood in stool and abdominal distention.  Musculoskeletal: Negative.   Neurological: Negative.   Psychiatric/Behavioral: Negative.       Objective:   Physical Exam  Constitutional: She is oriented to person, place, and time. She appears well-developed and well-nourished.  HENT:  Head: Normocephalic and atraumatic.  Right otitis externa, oropharynx with mild clear drainage.   Eyes: EOM are normal.  Cardiovascular: Normal rate and regular rhythm.   Pulmonary/Chest: Effort normal and breath sounds normal. No respiratory distress. She has no wheezes. She has no rales.  Abdominal: Soft. Bowel sounds are normal. She exhibits no distension. There is no tenderness. There is no rebound.  Lymphadenopathy:    She has no cervical  adenopathy.  Neurological: She is alert and oriented to person, place, and time. Coordination normal.  Skin: Skin is warm and dry.  Psychiatric: She has a normal mood and affect.   Filed Vitals:   09/25/14 1053  BP: 116/76  Pulse: 72  Temp: 97.8 F (36.6 C)  TempSrc: Oral  Resp: 12  Height: 5' 2"  (1.575 m)  Weight: 183 lb 1.9 oz (83.063 kg)  SpO2: 98%      Assessment & Plan:

## 2014-11-19 ENCOUNTER — Encounter: Payer: Self-pay | Admitting: Gynecology

## 2014-11-19 ENCOUNTER — Ambulatory Visit (INDEPENDENT_AMBULATORY_CARE_PROVIDER_SITE_OTHER): Payer: 59 | Admitting: Gynecology

## 2014-11-19 VITALS — BP 124/70 | Ht 63.0 in | Wt 177.0 lb

## 2014-11-19 DIAGNOSIS — N8111 Cystocele, midline: Secondary | ICD-10-CM | POA: Diagnosis not present

## 2014-11-19 DIAGNOSIS — N952 Postmenopausal atrophic vaginitis: Secondary | ICD-10-CM | POA: Diagnosis not present

## 2014-11-19 DIAGNOSIS — Z01419 Encounter for gynecological examination (general) (routine) without abnormal findings: Secondary | ICD-10-CM | POA: Diagnosis not present

## 2014-11-19 DIAGNOSIS — M8580 Other specified disorders of bone density and structure, unspecified site: Secondary | ICD-10-CM | POA: Diagnosis not present

## 2014-11-19 NOTE — Progress Notes (Signed)
[name] 1950-12-24 [id number]        64 y.o.  G2P2002 for annual exam.  Several issues that are below.  Past medical history,surgical history, problem list, medications, allergies, family history and social history were all reviewed and documented as reviewed in the EPIC chart.  ROS:  Performed with pertinent positives and negatives included in the history, assessment and plan.   Additional significant findings :  none   Exam: Kim Counsellor Vitals:   11/19/14 1158  BP: 124/70  Height: 5' 3"  (1.6 m)  Weight: 177 lb (80.287 kg)   General appearance:  Normal affect, orientation and appearance. Skin: Grossly normal HEENT: Without gross lesions.  No cervical or supraclavicular adenopathy. Thyroid normal.  Lungs:  Clear without wheezing, rales or rhonchi Cardiac: RR, without RMG Abdominal:  Soft, nontender, without masses, guarding, rebound, organomegaly or hernia Breasts:  Examined lying and sitting without masses, retractions, discharge or axillary adenopathy. Pelvic:  Ext/BUS/vagina with first to second degree cystocele. Mild uterine prolapse.  No significant rectocele.  Cervix with atrophic changes  Uterus anteverted, normal size, shape and contour, midline and mobile nontender   Adnexa  Without masses or tenderness    Anus and perineum  Normal   Rectovaginal  Normal sphincter tone without palpated masses or tenderness.    Assessment/Plan:  64 y.o. G27P2002 female for annual exam.   1. Postmenopausal/atrophic genital changes. Without significant hot flashes, night sweats, vaginal dryness or any bleeding. Continue to monitor report any issues or bleeding. 2. Urgency type symptoms. Has been going on for years. Is not interested in any medications or intervention. Check baseline urinalysis now. 3. Cystocele. First to second degree cystocele. Stable over serial exams. Patient without significant symptoms of pressure or discomfort. Options for management to include  pessary/surgery reviewed and patient is not interested. 4. Osteopenia.  01/2014 T score -1.1 FRAX 14%/0.5%. Stable from prior DEXA. Increased calcium vitamin D reviewed. Plan repeat DEXA in several years. 5. Pap smear/HPV negative 2015. History of cryosurgery a number of years ago with normal Pap smears since then. 6. Mammography 09/2013. Patient knows she is overdue. Plans to schedule and agrees to do so. SBE monthly reviewed. 7. Colonoscopy 2014. Repeat at their recommended interval. 8. Health maintenance. No routine blood work done as this is done at her primary physician's office. Follow up in one year, sooner as needed.   Anastasio Auerbach MD, 12:34 PM 11/19/2014

## 2014-11-19 NOTE — Patient Instructions (Addendum)
Call to Schedule your mammogram  Facilities in [place]: 1)  [facility], [address] Phone: [phone] 2)  [facility]. [address] [place] Phone: [phone] 3)  Dr. Isaiah Blakes at [facility] [address] Phone: [phone]     Mammogram A mammogram is an X-ray test to find changes in a woman's breast. You should get a mammogram if:  You are 64 years of age or older  You have risk factors.   Your doctor recommends that you have one.  BEFORE THE TEST  Do not schedule the test the week before your period, especially if your breasts are sore during this time.  On the day of your mammogram:  Wash your breasts and armpits well. After washing, do not put on any deodorant or talcum powder on until after your test.   Eat and drink as you usually do.   Take your medicines as usual.   If you are diabetic and take insulin, make sure you:   Eat before coming for your test.   Take your insulin as usual.   If you cannot keep your appointment, call before the appointment to cancel. Schedule another appointment.  TEST  You will need to undress from the waist up. You will put on a hospital gown.   Your breast will be put on the mammogram machine, and it will press firmly on your breast with a piece of plastic called a compression paddle. This will make your breast flatter so that the machine can X-ray all parts of your breast.   Both breasts will be X-rayed. Each breast will be X-rayed from above and from the side. An X-ray might need to be taken again if the picture is not good enough.   The mammogram will last about 15 to 30 minutes.  AFTER THE TEST Finding out the results of your test Ask when your test results will be ready. Make sure you get your test results.  Document Released: 06/24/2008 Document Revised: 03/17/2011 Document Reviewed: 06/24/2008 [facility] Patient  Information 2012 [place].  You may obtain a copy of any labs that were done today by logging onto MyChart as outlined in the instructions provided with your AVS (after visit summary). The office will not call with normal lab results but certainly if there are any significant abnormalities then we will contact you.   Health Maintenance, Female A healthy lifestyle and preventative care can promote health and wellness.  Maintain regular health, dental, and eye exams.  Eat a healthy diet. Foods like vegetables, fruits, whole grains, low-fat dairy products, and lean protein foods contain the nutrients you need without too many calories. Decrease your intake of foods high in solid fats, added sugars, and salt. Get information about a proper diet from your caregiver, if necessary.  Regular physical exercise is one of the most important things you can do for your health. Most adults should get at least 150 minutes of moderate-intensity exercise (any activity that increases your heart rate and causes you to sweat) each week. In addition, most adults need muscle-strengthening exercises on 2 or more days a week.   Maintain a healthy weight. The body mass index (BMI) is a screening tool to identify possible weight problems. It provides an estimate of body fat based on height and weight. Your caregiver can help determine your BMI, and can help you achieve or maintain a healthy weight. For adults  20 years and older:  A BMI below 18.5 is considered underweight.  A BMI of 18.5 to 24.9 is normal.  A BMI of 25 to 29.9 is considered overweight.  A BMI of 30 and above is considered obese.  Maintain normal blood lipids and cholesterol by exercising and minimizing your intake of saturated fat. Eat a balanced diet with plenty of fruits and vegetables. Blood tests for lipids and cholesterol should begin at age 20 and be repeated every 5 years. If your lipid or cholesterol levels are high, you are over 50, or  you are a high risk for heart disease, you may need your cholesterol levels checked more frequently.Ongoing high lipid and cholesterol levels should be treated with medicines if diet and exercise are not effective.  If you smoke, find out from your caregiver how to quit. If you do not use tobacco, do not start.  Lung cancer screening is recommended for adults aged 55 80 years who are at high risk for developing lung cancer because of a history of smoking. Yearly low-dose computed tomography (CT) is recommended for people who have at least a 30-pack-year history of smoking and are a current smoker or have quit within the past 15 years. A pack year of smoking is smoking an average of 1 pack of cigarettes a day for 1 year (for example: 1 pack a day for 30 years or 2 packs a day for 15 years). Yearly screening should continue until the smoker has stopped smoking for at least 15 years. Yearly screening should also be stopped for people who develop a health problem that would prevent them from having lung cancer treatment.  If you are pregnant, do not drink alcohol. If you are breastfeeding, be very cautious about drinking alcohol. If you are not pregnant and choose to drink alcohol, do not exceed 1 drink per day. One drink is considered to be 12 ounces (355 mL) of beer, 5 ounces (148 mL) of wine, or 1.5 ounces (44 mL) of liquor.  Avoid use of street drugs. Do not share needles with anyone. Ask for help if you need support or instructions about stopping the use of drugs.  High blood pressure causes heart disease and increases the risk of stroke. Blood pressure should be checked at least every 1 to 2 years. Ongoing high blood pressure should be treated with medicines, if weight loss and exercise are not effective.  If you are 55 to 64 years old, ask your caregiver if you should take aspirin to prevent strokes.  Diabetes screening involves taking a blood sample to check your fasting blood sugar level. This  should be done once every 3 years, after age 45, if you are within normal weight and without risk factors for diabetes. Testing should be considered at a younger age or be carried out more frequently if you are overweight and have at least 1 risk factor for diabetes.  Breast cancer screening is essential preventative care for women. You should practice "breast self-awareness." This means understanding the normal appearance and feel of your breasts and may include breast self-examination. Any changes detected, no matter how small, should be reported to a caregiver. Women in their 20s and 30s should have a clinical breast exam (CBE) by a caregiver as part of a regular health exam every 1 to 3 years. After age 40, women should have a CBE every year. Starting at age 40, women should consider having a mammogram (breast X-ray) every year. Women who have a   family history of breast cancer should talk to their caregiver about genetic screening. Women at a high risk of breast cancer should talk to their caregiver about having an MRI and a mammogram every year.  Breast cancer gene (BRCA)-related cancer risk assessment is recommended for women who have family members with BRCA-related cancers. BRCA-related cancers include breast, ovarian, tubal, and peritoneal cancers. Having family members with these cancers may be associated with an increased risk for harmful changes (mutations) in the breast cancer genes BRCA1 and BRCA2. Results of the assessment will determine the need for genetic counseling and BRCA1 and BRCA2 testing.  The Pap test is a screening test for cervical cancer. Women should have a Pap test starting at age 33. Between ages 75 and 14, Pap tests should be repeated every 2 years. Beginning at age 4, you should have a Pap test every 3 years as long as the past 3 Pap tests have been normal. If you had a hysterectomy for a problem that was not cancer or a condition that could lead to cancer, then you no longer  need Pap tests. If you are between ages 72 and 12, and you have had normal Pap tests going back 10 years, you no longer need Pap tests. If you have had past treatment for cervical cancer or a condition that could lead to cancer, you need Pap tests and screening for cancer for at least 20 years after your treatment. If Pap tests have been discontinued, risk factors (such as a new sexual partner) need to be reassessed to determine if screening should be resumed. Some women have medical problems that increase the chance of getting cervical cancer. In these cases, your caregiver may recommend more frequent screening and Pap tests.  The human papillomavirus (HPV) test is an additional test that may be used for cervical cancer screening. The HPV test looks for the virus that can cause the cell changes on the cervix. The cells collected during the Pap test can be tested for HPV. The HPV test could be used to screen women aged 84 years and older, and should be used in women of any age who have unclear Pap test results. After the age of 73, women should have HPV testing at the same frequency as a Pap test.  Colorectal cancer can be detected and often prevented. Most routine colorectal cancer screening begins at the age of 56 and continues through age 76. However, your caregiver may recommend screening at an earlier age if you have risk factors for colon cancer. On a yearly basis, your caregiver may provide home test kits to check for hidden blood in the stool. Use of a small camera at the end of a tube, to directly examine the colon (sigmoidoscopy or colonoscopy), can detect the earliest forms of colorectal cancer. Talk to your caregiver about this at age 20, when routine screening begins. Direct examination of the colon should be repeated every 5 to 10 years through age 83, unless early forms of pre-cancerous polyps or small growths are found.  Hepatitis C blood testing is recommended for all people born from [birth year]  through 1965 and any individual with known risks for hepatitis C.  Practice safe sex. Use condoms and avoid high-risk sexual practices to reduce the spread of sexually transmitted infections (STIs). Sexually active women aged 28 and younger should be checked for Chlamydia, which is a common sexually transmitted infection. Older women with new or multiple partners should also be tested for Chlamydia. Testing for  other STIs is recommended if you are sexually active and at increased risk.  Osteoporosis is a disease in which the bones lose minerals and strength with aging. This can result in serious bone fractures. The risk of osteoporosis can be identified using a bone density scan. Women ages 35 and over and women at risk for fractures or osteoporosis should discuss screening with their caregivers. Ask your caregiver whether you should be taking a calcium supplement or vitamin D to reduce the rate of osteoporosis.  Menopause can be associated with physical symptoms and risks. Hormone replacement therapy is available to decrease symptoms and risks. You should talk to your caregiver about whether hormone replacement therapy is right for you.  Use sunscreen. Apply sunscreen liberally and repeatedly throughout the day. You should seek shade when your shadow is shorter than you. Protect yourself by wearing long sleeves, pants, a wide-brimmed hat, and sunglasses year round, whenever you are outdoors.  Notify your caregiver of new moles or changes in moles, especially if there is a change in shape or color. Also notify your caregiver if a mole is larger than the size of a pencil eraser.  Stay current with your immunizations. Document Released: 10/11/2010 Document Revised: 07/23/2012 Document Reviewed: 10/11/2010 [facility] Patient Information 2014 [place].

## 2014-11-20 LAB — URINALYSIS W MICROSCOPIC + REFLEX CULTURE
Bilirubin Urine: NEGATIVE
Casts: NONE SEEN [LPF]
GLUCOSE, UA: NEGATIVE
HGB URINE DIPSTICK: NEGATIVE
LEUKOCYTES UA: NEGATIVE
NITRITE: NEGATIVE
PROTEIN: NEGATIVE
Specific Gravity, Urine: 1.022 (ref 1.001–1.035)
Yeast: NONE SEEN [HPF]
pH: 7.5 (ref 5.0–8.0)

## 2014-11-21 ENCOUNTER — Other Ambulatory Visit: Payer: Self-pay | Admitting: Gynecology

## 2014-11-21 MED ORDER — SULFAMETHOXAZOLE-TRIMETHOPRIM 800-160 MG PO TABS: 1.0000 | ORAL_TABLET | Freq: Two times a day (BID) | ORAL | Status: DC

## 2014-11-22 LAB — URINE CULTURE: Colony Count: >=100000

## 2014-12-02 ENCOUNTER — Ambulatory Visit: Payer: No Typology Code available for payment source | Admitting: Internal Medicine

## 2014-12-05 ENCOUNTER — Other Ambulatory Visit: Payer: No Typology Code available for payment source

## 2014-12-05 ENCOUNTER — Encounter: Payer: Self-pay | Admitting: Internal Medicine

## 2014-12-05 ENCOUNTER — Ambulatory Visit (INDEPENDENT_AMBULATORY_CARE_PROVIDER_SITE_OTHER): Payer: No Typology Code available for payment source | Admitting: Internal Medicine

## 2014-12-05 VITALS — BP 130/80 | HR 72 | Temp 98.0°F | Resp 14 | Ht 62.0 in | Wt 177.0 lb

## 2014-12-05 DIAGNOSIS — K529 Noninfective gastroenteritis and colitis, unspecified: Secondary | ICD-10-CM

## 2014-12-05 DIAGNOSIS — Z0000 Encounter for general adult medical examination without abnormal findings: Secondary | ICD-10-CM

## 2014-12-05 LAB — LIPID PANEL
CHOLESTEROL: 180 mg/dL (ref 125–200)
HDL: 43 mg/dL — AB (ref >=46)
LDL Cholesterol: 108 mg/dL (ref <130)
Total CHOL/HDL Ratio: 4.2 Ratio (ref <=5.0)
Triglycerides: 145 mg/dL (ref <150)
VLDL: 29 mg/dL (ref <30)

## 2014-12-05 LAB — COMPREHENSIVE METABOLIC PANEL
ALBUMIN: 4.5 g/dL (ref 3.6–5.1)
ALK PHOS: 48 U/L (ref 33–130)
ALT: 29 U/L (ref 6–29)
AST: 36 U/L — ABNORMAL HIGH (ref 10–35)
BUN: 12 mg/dL (ref 7–25)
CALCIUM: 9.7 mg/dL (ref 8.6–10.4)
CO2: 28 mmol/L (ref 20–31)
Chloride: 102 mmol/L (ref 98–110)
Creat: 0.95 mg/dL (ref 0.50–0.99)
Glucose, Bld: 81 mg/dL (ref 65–99)
Potassium: 4.6 mmol/L (ref 3.5–5.3)
Sodium: 143 mmol/L (ref 135–146)
TOTAL PROTEIN: 7.3 g/dL (ref 6.1–8.1)
Total Bilirubin: 0.7 mg/dL (ref 0.2–1.2)

## 2014-12-05 MED ORDER — DIPHENOXYLATE-ATROPINE 2.5-0.025 MG PO TABS: 1.0000 | ORAL_TABLET | Freq: Four times a day (QID) | ORAL | Status: DC | PRN

## 2014-12-05 NOTE — Progress Notes (Signed)
Pre visit review using our clinic review tool, if applicable. No additional management support is needed unless otherwise documented below in the visit note. 

## 2014-12-05 NOTE — Patient Instructions (Signed)
We have given you a prescription for lomotil which is a diarrhea medicine that you can take up to 4 times a day for diarrhea. You can take it daily and then if you are having loose stools you can take additional doses. If you are getting constipated cut back or stop this medicine.   Come back in about 6 months if you are doing well and sooner if you are still having problems.

## 2014-12-07 DIAGNOSIS — K529 Noninfective gastroenteritis and colitis, unspecified: Secondary | ICD-10-CM | POA: Insufficient documentation

## 2014-12-07 NOTE — Progress Notes (Signed)
   Subjective:    Patient ID: [name], female    DOB: 05/17/1950, 64 y.o.   MRN: [id number]  HPI The patient is a 64 YO female coming in for follow up of diarrhea. She is still having loose bowel movements after eating. Sometimes several in a row. She did try the imodium that we talked about last time and it helped some but she did not want to take something everyday. She has not taken it in about several weeks. No blood. Does not float in the toilet. Not foul smelling. Appetite is good but weight is down about 6 pounds since last visit.   Review of Systems  Constitutional: Positive for unexpected weight change. Negative for chills, activity change, appetite change and fatigue.  Respiratory: Positive for cough. Negative for chest tightness, shortness of breath and wheezing.   Cardiovascular: Negative for chest pain, palpitations and leg swelling.  Gastrointestinal: Positive for diarrhea. Negative for abdominal pain, constipation, blood in stool and abdominal distention.  Musculoskeletal: Negative.   Neurological: Negative.   Psychiatric/Behavioral: Negative.       Objective:   Physical Exam  Constitutional: She is oriented to person, place, and time. She appears well-developed and well-nourished.  HENT:  Head: Normocephalic and atraumatic.  Eyes: EOM are normal.  Cardiovascular: Normal rate and regular rhythm.   Pulmonary/Chest: Effort normal and breath sounds normal. No respiratory distress. She has no wheezes. She has no rales.  Abdominal: Soft. Bowel sounds are normal. She exhibits no distension. There is no tenderness. There is no rebound.  Lymphadenopathy:    She has no cervical adenopathy.  Neurological: She is alert and oriented to person, place, and time. Coordination normal.  Skin: Skin is warm and dry.  Psychiatric: She has a normal mood and affect.   Filed Vitals:   12/05/14 1341  BP: 130/80  Pulse: 72  Temp: 98 F (36.7 C)  TempSrc: Oral  Resp: 14  Height:  5' 2"  (1.575 m)  Weight: 177 lb (80.287 kg)  SpO2: 98%      Assessment & Plan:

## 2014-12-07 NOTE — Assessment & Plan Note (Signed)
Started after an episode of colitis. Originally was bloody but now no blood in months. Is able to eat fine, denies pain. Diarrhea after every meal. She tried imodium and not that helpful so she discontinued although unclear if she was able to increase based on results. Will try rx for lomotil. She does have appointment with GI in September which she will keep. Concerning that she is down about 6 pounds since last visit. Last colonoscopy 2014 due for repeat in 2019.

## 2014-12-10 ENCOUNTER — Encounter: Payer: Self-pay | Admitting: Gynecology

## 2014-12-17 ENCOUNTER — Ambulatory Visit (INDEPENDENT_AMBULATORY_CARE_PROVIDER_SITE_OTHER): Payer: No Typology Code available for payment source | Admitting: Gastroenterology

## 2014-12-17 ENCOUNTER — Encounter: Payer: Self-pay | Admitting: Gastroenterology

## 2014-12-17 ENCOUNTER — Other Ambulatory Visit: Payer: No Typology Code available for payment source

## 2014-12-17 VITALS — BP 108/60 | HR 76 | Ht 62.0 in | Wt 177.0 lb

## 2014-12-17 DIAGNOSIS — R197 Diarrhea, unspecified: Secondary | ICD-10-CM | POA: Diagnosis not present

## 2014-12-17 MED ORDER — HYOSCYAMINE SULFATE 0.125 MG SL SUBL: SUBLINGUAL_TABLET | SUBLINGUAL | Status: DC

## 2014-12-17 NOTE — Patient Instructions (Signed)
Your physician has requested that you go to the basement for the following lab work before leaving today: GI pathogen panel.    We have sent the following medications to your pharmacy for you to pick up at your convenience:Levsin.  Your follow up with Dr. Fuller Plan is on 02/06/15 at 9:00am.   Thank you for choosing me and [facility].  Pricilla Riffle. Dagoberto Ligas., MD., Marval Regal

## 2014-12-17 NOTE — Progress Notes (Signed)
    History of Present Illness: This is a 64 year old female referred by Dr. Vertell Novak for the evaluation of diarrhea. She previously underwent EGD in March 2006 for reflux symptoms with findings of a small hiatal hernia and mild non erosive gastritis. She underwent colonoscopy in August 2014 that showed mild sigmoid colon and transverse colon diverticulosis. She was hospitalized in June 2016 for segmental colitis presumed infectious versus ischemic. She states she was treated with a short course of antibiotics for urinary tract infection by her gynecologist and following that she developed urgent loose stools following meals for the past 6 weeks. She has had an occasional episode of mild incontinence. She states that she has multiple food intolerances and although she has not been diagnosed with celiac disease she follows a gluten free diet. She also follows a lactose-free diet. She has other intolerances as well.  Review of Systems: Pertinent positive and negative review of systems were noted in the above HPI section. All other review of systems were otherwise negative.  Current Medications, Allergies, Past Medical History, Past Surgical History, Family History and Social History were reviewed in [employer] record.  Physical Exam: General: Well developed, well nourished, no acute distress Head: Normocephalic and atraumatic Eyes:  sclerae anicteric, EOMI Ears: Normal auditory acuity Mouth: No deformity or lesions Neck: Supple, no masses or thyromegaly Lungs: Clear throughout to auscultation Heart: Regular rate and rhythm; no murmurs, rubs or bruits Abdomen: Soft, non tender and non distended. No masses, hepatosplenomegaly or hernias noted. Normal Bowel sounds Musculoskeletal: Symmetrical with no gross deformities  Skin: No lesions on visible extremities Pulses:  Normal pulses noted Extremities: No clubbing, cyanosis, edema or deformities noted Neurological: Alert  oriented x 4, grossly nonfocal Cervical Nodes:  No significant cervical adenopathy Inguinal Nodes: No significant inguinal adenopathy Psychological:  Alert and cooperative. Normal mood and affect  Assessment and Recommendations:  1. Diarrhea for 6 weeks. R/O infectious cause. Obtain a GI pathogen panel. Bland diet. Levsin 1-2 ac. Continue Lomotil twice a day as needed. REV in 6 weeks.   cc: Dr. Vertell Novak

## 2014-12-18 ENCOUNTER — Other Ambulatory Visit: Payer: No Typology Code available for payment source

## 2014-12-18 DIAGNOSIS — R197 Diarrhea, unspecified: Secondary | ICD-10-CM

## 2014-12-22 ENCOUNTER — Other Ambulatory Visit: Payer: Self-pay

## 2014-12-22 MED ORDER — LISINOPRIL 2.5 MG PO TABS: 2.5000 mg | ORAL_TABLET | Freq: Every day | ORAL | Status: DC

## 2014-12-22 MED ORDER — ATENOLOL-CHLORTHALIDONE 50-25 MG PO TABS: 1.0000 | ORAL_TABLET | Freq: Every day | ORAL | Status: DC

## 2014-12-23 ENCOUNTER — Telehealth: Payer: Self-pay | Admitting: Internal Medicine

## 2014-12-23 LAB — GASTROINTESTINAL PATHOGEN PANEL PCR
C. DIFFICILE TOX A/B, PCR: NEGATIVE
Campylobacter, PCR: NEGATIVE
Cryptosporidium, PCR: NEGATIVE
E COLI (STEC) STX1/STX2, PCR: NEGATIVE
E COLI 0157, PCR: NEGATIVE
E coli (ETEC) LT/ST PCR: NEGATIVE
Giardia lamblia, PCR: NEGATIVE
Norovirus, PCR: NEGATIVE
Rotavirus A, PCR: NEGATIVE
Salmonella, PCR: NEGATIVE
Shigella, PCR: NEGATIVE

## 2014-12-23 MED ORDER — ATENOLOL-CHLORTHALIDONE 50-25 MG PO TABS: 1.0000 | ORAL_TABLET | Freq: Every day | ORAL | Status: DC

## 2014-12-23 MED ORDER — LISINOPRIL 2.5 MG PO TABS: 2.5000 mg | ORAL_TABLET | Freq: Every day | ORAL | Status: DC

## 2014-12-23 NOTE — Telephone Encounter (Signed)
Called pt to verify which medication she is needing the Atenolol & Lisinopril sent to [facility]...[name]

## 2014-12-23 NOTE — Telephone Encounter (Signed)
Pt called in and wanted to see if the last 2 meds that were called in could be called into [facility] not [facility] .  Can these be resent?

## 2015-02-06 ENCOUNTER — Ambulatory Visit: Payer: No Typology Code available for payment source | Admitting: Gastroenterology

## 2015-02-17 ENCOUNTER — Ambulatory Visit: Payer: No Typology Code available for payment source | Admitting: Gastroenterology

## 2015-04-01 ENCOUNTER — Encounter: Payer: Self-pay | Admitting: Internal Medicine

## 2015-04-01 ENCOUNTER — Ambulatory Visit (INDEPENDENT_AMBULATORY_CARE_PROVIDER_SITE_OTHER): Payer: No Typology Code available for payment source | Admitting: Internal Medicine

## 2015-04-01 VITALS — BP 128/82 | HR 99 | Temp 98.7°F | Resp 20 | Ht 64.0 in | Wt 179.0 lb

## 2015-04-01 DIAGNOSIS — J209 Acute bronchitis, unspecified: Secondary | ICD-10-CM | POA: Diagnosis not present

## 2015-04-01 MED ORDER — HYDROCODONE-HOMATROPINE 5-1.5 MG/5ML PO SYRP: 5.0000 mL | ORAL_SOLUTION | Freq: Four times a day (QID) | ORAL | Status: DC | PRN

## 2015-04-01 MED ORDER — AMOXICILLIN 500 MG PO CAPS: 500.0000 mg | ORAL_CAPSULE | Freq: Three times a day (TID) | ORAL | Status: DC

## 2015-04-01 NOTE — Progress Notes (Signed)
Pre visit review using our clinic review tool, if applicable. No additional management support is needed unless otherwise documented below in the visit note. 

## 2015-04-01 NOTE — Patient Instructions (Signed)

## 2015-04-01 NOTE — Progress Notes (Signed)
   Subjective:    Patient ID: [name], female    DOB: [birth year], 64 y.o.   MRN: [id number]  HPI Her symptoms began 5 days ago as sore throat followed by head and chest congestion. Her grandchildren &  husband have been ill with respiratory tract infection illnesses. The cough now is productive of yellow-green sputum. She also has some yellow nasal discharge. The volume was greater from the chest than the head. She's had associated fever, chills, and sweats. The right eye was matted on one occasion. When she blows her nose she has pressure in the right ear. She's had an intermittent dull frontal headache.  She's been using a generic cough syrup and Tylenol.  Review of Systems She denies frank frontal sinus or maxillary sinus pain. She has no associated dental pain. Other than matting of eye there are no extrinsic symptoms of itchy, watery eyes or sneezing. The cough is not associated with shortness of breath or wheezing.    Objective:   Physical Exam  The right tympanic membrane is dull. The nares are dry with debris present in the right nare. An S4 is noted.  General appearance:Adequately nourished; no acute distress or increased work of breathing is present.    Lymphatic: No  lymphadenopathy about the head, neck, or axilla .  Eyes: No conjunctival inflammation or lid edema is present. There is no scleral icterus.  Ears:  External ear exam shows no significant lesions or deformities.  Otoscopic examination reveals clear canals, tympanic membranes are intact bilaterally without bulging, retraction, inflammation or discharge.  Nose:  External nasal examination shows no deformity or inflammation.  Oral exam: Dental hygiene is good; lips and gums are healthy appearing.There is no oropharyngeal erythema or exudate .  Neck:  No deformities, thyromegaly, masses, or tenderness noted.   Supple with full range of motion without pain.   Heart:  Normal rate and regular rhythm. S1 and S2  normal without gallop, murmur, click, or rub .   Lungs:Chest clear to auscultation; no wheezes, rhonchi,rales ,or rubs present.  Extremities:  No cyanosis, edema, or clubbing  noted    Skin: Warm & dry w/o tenting or jaundice. No significant lesions or rash.     Assessment & Plan:  #1 acute bronchitis w/o bronchospasm #2 URI, acute Plan: See orders and recommendations

## 2015-04-17 ENCOUNTER — Telehealth: Payer: Self-pay | Admitting: Internal Medicine

## 2015-04-17 MED ORDER — POTASSIUM CHLORIDE CRYS ER 20 MEQ PO TBCR: 20.0000 meq | EXTENDED_RELEASE_TABLET | Freq: Two times a day (BID) | ORAL | Status: DC

## 2015-04-17 NOTE — Telephone Encounter (Signed)
Pt called in and needs [place] to send in script for potassium chloride SA (KLOR-CON M20) 20 MEQ tablet [[id number]]  To [facility]

## 2015-04-17 NOTE — Telephone Encounter (Signed)
Sent in

## 2015-04-23 ENCOUNTER — Encounter: Payer: Self-pay | Admitting: Internal Medicine

## 2015-04-28 NOTE — Telephone Encounter (Signed)
Can you please call pt and talk to her about this email and her meds  Best number [phone]

## 2015-04-29 ENCOUNTER — Telehealth: Payer: Self-pay | Admitting: *Deleted

## 2015-04-29 MED ORDER — POTASSIUM CHLORIDE CRYS ER 20 MEQ PO TBCR: 20.0000 meq | EXTENDED_RELEASE_TABLET | Freq: Two times a day (BID) | ORAL | Status: DC

## 2015-04-29 MED ORDER — METHOCARBAMOL 500 MG PO TABS: 500.0000 mg | ORAL_TABLET | Freq: Four times a day (QID) | ORAL | Status: DC | PRN

## 2015-04-29 MED ORDER — ATENOLOL-CHLORTHALIDONE 50-25 MG PO TABS: 1.0000 | ORAL_TABLET | Freq: Every day | ORAL | Status: DC

## 2015-04-29 MED ORDER — SIMVASTATIN 20 MG PO TABS: 20.0000 mg | ORAL_TABLET | Freq: Every day | ORAL | Status: DC

## 2015-04-29 MED ORDER — LISINOPRIL 2.5 MG PO TABS: 2.5000 mg | ORAL_TABLET | Freq: Every day | ORAL | Status: DC

## 2015-04-29 NOTE — Telephone Encounter (Signed)
Received call pt states she sent email last week trying to get her medications straight and sent to correct pharmacy. Per chart pt sent email 1/12 attached below. Inform pt will correct and send to [facility]...[name]   Read Composed From To Subject    Y 04/23/2015 12:46 PM Lovena Neighbours, MD Visit Follow-Up Question      Encounter Messages  Expand AllCollapse All     Visit Follow-Up Question     From   Durene Fruits    To   Hoyt Koch, MD    Sent   04/23/2015 12:46 PM       All of my prescriptions are messed up. Only acute care medicine [facility] [place]   ALL everyday prescriptions should go to [facility] order.   They need new prescriptions of 90 days each  For Lisinopril,  Klor-Con M 20,  and Simvastin  Atenolol-Chlorthdalidone I think is ok but should also be on the mail order at [facility]. Double check.  I also have a muscle relaxer I use occasionally - Methocarbam 500 mg.   I am getting very frustrated with this.  Please make a note in your computer system for future prescriptions.  Thank you.  Milus Height

## 2015-04-29 NOTE — Telephone Encounter (Signed)
Pt called triage have been taking care pls see phone note from today...[name]

## 2015-06-08 ENCOUNTER — Encounter: Payer: Self-pay | Admitting: Internal Medicine

## 2015-06-08 ENCOUNTER — Ambulatory Visit (INDEPENDENT_AMBULATORY_CARE_PROVIDER_SITE_OTHER): Payer: No Typology Code available for payment source | Admitting: Internal Medicine

## 2015-06-08 VITALS — BP 130/70 | HR 73 | Temp 97.7°F | Resp 16 | Ht 62.0 in | Wt 175.0 lb

## 2015-06-08 DIAGNOSIS — K529 Noninfective gastroenteritis and colitis, unspecified: Secondary | ICD-10-CM | POA: Diagnosis not present

## 2015-06-08 DIAGNOSIS — I10 Essential (primary) hypertension: Secondary | ICD-10-CM

## 2015-06-08 MED ORDER — POTASSIUM CHLORIDE CRYS ER 20 MEQ PO TBCR: 20.0000 meq | EXTENDED_RELEASE_TABLET | Freq: Two times a day (BID) | ORAL | Status: DC

## 2015-06-08 NOTE — Patient Instructions (Signed)
We do not need any blood work today.   Keep the same medicines and come back in about 6 months for a physical.   Keep working on exercise 4-5 times per week to keep the body healthy.

## 2015-06-08 NOTE — Progress Notes (Signed)
Pre visit review using our clinic review tool, if applicable. No additional management support is needed unless otherwise documented below in the visit note. 

## 2015-06-11 NOTE — Assessment & Plan Note (Signed)
Stable on atenolol/chlorthalidone and lisinopril and recent CMP without indication for change or complications.

## 2015-06-11 NOTE — Progress Notes (Signed)
   Subjective:    Patient ID: [name], female    DOB: [birth year], 65 y.o.   MRN: [id number]  HPI The patient is a 65 YO female coming in for follow up of her blood pressure. Still taking the same medicines. Not exercising much right now. No chest pains and no headaches. Still intermittent abdominal pains (seeing GI too) which are stable. Not complicated and no side effects.   Review of Systems  Constitutional: Negative for chills, activity change, appetite change, fatigue and unexpected weight change.  Respiratory: Positive for cough. Negative for chest tightness, shortness of breath and wheezing.   Cardiovascular: Negative for chest pain, palpitations and leg swelling.  Gastrointestinal: Positive for diarrhea. Negative for abdominal pain, constipation, blood in stool and abdominal distention.  Musculoskeletal: Negative.   Neurological: Negative.   Psychiatric/Behavioral: Negative.       Objective:   Physical Exam  Constitutional: She is oriented to person, place, and time. She appears well-developed and well-nourished.  HENT:  Head: Normocephalic and atraumatic.  Eyes: EOM are normal.  Cardiovascular: Normal rate and regular rhythm.   Pulmonary/Chest: Effort normal and breath sounds normal. No respiratory distress. She has no wheezes. She has no rales.  Abdominal: Soft. Bowel sounds are normal. She exhibits no distension. There is no tenderness. There is no rebound.  Lymphadenopathy:    She has no cervical adenopathy.  Neurological: She is alert and oriented to person, place, and time. Coordination normal.  Skin: Skin is warm and dry.  Psychiatric: She has a normal mood and affect.   Filed Vitals:   06/08/15 1343  BP: 130/70  Pulse: 73  Temp: 97.7 F (36.5 C)  TempSrc: Oral  Resp: 16  Height: 5' 2"  (1.575 m)  Weight: 175 lb (79.379 kg)  SpO2: 97%      Assessment & Plan:

## 2015-06-11 NOTE — Assessment & Plan Note (Signed)
Overall stable to mildly improved. Takes levsin as needed for the cramps associated. Is able to know which foods cause some of her symptoms. Also following with GI.

## 2015-06-29 ENCOUNTER — Telehealth: Payer: Self-pay | Admitting: *Deleted

## 2015-06-29 NOTE — Telephone Encounter (Signed)
Left msg on triage stating saw md couple weeks ago, but since then have came down with head/chest cold. Been running low grade fever, coughing up greenish phlegm, having headache, chills at times. Been taking mucinex but since mucus is green wanting to see if md can rx antibiotic. Pls advise...[name]

## 2015-06-29 NOTE — Telephone Encounter (Signed)
Generally these are viral the first 7-10 days. If not improving after that we can consider but color alone of sputum does not increase chance for bacteria.

## 2015-06-30 NOTE — Telephone Encounter (Signed)
Called pt no answer LMOM w/MD response...[name]

## 2015-07-03 ENCOUNTER — Telehealth: Payer: Self-pay | Admitting: *Deleted

## 2015-07-03 MED ORDER — BENZONATATE 100 MG PO CAPS: 100.0000 mg | ORAL_CAPSULE | Freq: Two times a day (BID) | ORAL | Status: DC | PRN

## 2015-07-03 NOTE — Telephone Encounter (Signed)
Left msg on triage stating she is not ay better cough is getting worse. Having chills, coughing up dark greenish phlegm, cough so much that my chest hurts. Requesting md to rx something for sxs...[name]

## 2015-07-03 NOTE — Telephone Encounter (Signed)
Notified pt w/MD response. No appt available today, but inform pt can get her in for Sat clinic tomorrow. Will call her back after 12 once sat clinic appt is open...[name]

## 2015-07-03 NOTE — Telephone Encounter (Signed)
Called pt made appt for Sat clinic @ 9:15...[name]

## 2015-07-03 NOTE — Addendum Note (Signed)
Addended by: Earnstine Regal on: 07/03/2015 10:42 AM   Modules accepted: Orders

## 2015-07-03 NOTE — Telephone Encounter (Signed)
Recommend acute visit. She is still within time frame for virus and can have fevers and chills with virus. Sent in [place] to help with the cough.

## 2015-07-03 NOTE — Telephone Encounter (Signed)
MD miss and sent Tessalon Pearles to [facility]. Called [facility] spoke w/rep cancel rx that was sent, and resent to local [facility].../lmb

## 2015-07-04 ENCOUNTER — Encounter: Payer: Self-pay | Admitting: Internal Medicine

## 2015-07-04 ENCOUNTER — Ambulatory Visit (INDEPENDENT_AMBULATORY_CARE_PROVIDER_SITE_OTHER): Payer: No Typology Code available for payment source | Admitting: Internal Medicine

## 2015-07-04 VITALS — BP 108/78 | HR 102 | Temp 99.1°F | Resp 18 | Wt 171.0 lb

## 2015-07-04 DIAGNOSIS — R062 Wheezing: Secondary | ICD-10-CM | POA: Diagnosis not present

## 2015-07-04 DIAGNOSIS — J209 Acute bronchitis, unspecified: Secondary | ICD-10-CM | POA: Diagnosis not present

## 2015-07-04 MED ORDER — AZITHROMYCIN 250 MG PO TABS: ORAL_TABLET | ORAL | Status: DC

## 2015-07-04 MED ORDER — PREDNISONE 10 MG PO TABS: ORAL_TABLET | ORAL | Status: DC

## 2015-07-04 MED ORDER — HYDROCODONE-HOMATROPINE 5-1.5 MG/5ML PO SYRP: 5.0000 mL | ORAL_SOLUTION | Freq: Four times a day (QID) | ORAL | Status: DC | PRN

## 2015-07-04 NOTE — Patient Instructions (Signed)
Your prescription(s) have been submitted to your pharmacy or been printed and provided for you. Please take as directed and contact our office if you believe you are having problem(s) with the medication(s) or have any questions.  If your symptoms worsen or fail to improve, please contact our office for further instruction, or in case of emergency go directly to the emergency room at the closest medical facility.   General Recommendations:    Please drink plenty of fluids.  Get plenty of rest   Sleep in humidified air  Use saline nasal sprays  Netti pot  OTC Medications:  Decongestants - helps relieve congestion   Flonase (generic fluticasone) or Nasacort (generic triamcinolone) - please make sure to use the "cross-over" technique at a 45 degree angle towards the opposite eye as opposed to straight up the nasal passageway.   Sudafed (generic pseudoephedrine - Note this is the one that is available behind the pharmacy counter); Products with phenylephrine (-PE) may also be used but is often not as effective as pseudoephedrine.   If you have HIGH BLOOD PRESSURE - Coricidin HBP; AVOID any product that is -D as this contains pseudoephedrine which may increase your blood pressure.  Afrin (oxymetazoline) every 6-8 hours for up to 3 days.  Allergies - helps relieve runny nose, itchy eyes and sneezing   Claritin (generic loratidine), Allegra (fexofenidine), or Zyrtec (generic cyrterizine) for runny nose. These medications should not cause drowsiness.  Note - Benadryl (generic diphenhydramine) may be used however may cause drowsiness  Cough -   Delsym or Robitussin (generic dextromethorphan)  Expectorants - helps loosen mucus to ease removal   Mucinex (generic guaifenesin) as directed on the package.  Headaches / General Aches   Tylenol (generic acetaminophen) - DO NOT EXCEED 3 grams (3,000 mg) in a 24 hour time period  Advil/Motrin (generic  ibuprofen)  Sore Throat -   Salt water gargle   Chloraseptic (generic benzocaine) spray or lozenges / Sucrets (generic dyclonine)

## 2015-07-04 NOTE — Progress Notes (Signed)
Pre visit review using our clinic review tool, if applicable. No additional management support is needed unless otherwise documented below in the visit note. 

## 2015-07-04 NOTE — Progress Notes (Signed)
Subjective:    Patient ID: [name], female    DOB: [birth year], 65 y.o.   MRN: [id number]  HPI She is here for an acute visit for cold symptoms.   Her symptoms started two weeks ago.  Her symptoms started with cold chills, headaches and a cough.  She did better and then the cold chills started again two days ago.  She has had a productive cough, wheeze, chest pressure, dull headache, body aches, and mild nausea.    She has tried mucinex and has taken it for two weeks.  She has also taken cough syrup, tylenol, zyrtec, nasal spray.  There has been no improvement.  Medications and allergies reviewed with patient and updated if appropriate.  Patient Active Problem List   Diagnosis Date Noted  . Chronic diarrhea 12/07/2014  . Right otitis externa 09/27/2014  . Segmental colitis with rectal bleeding ([name]) 09/12/2014  . Routine general medical examination at a health care facility 06/05/2014  . Impaired fasting glucose 06/13/2011  . Overweight 06/13/2011  . ABNORMAL CHEST XRAY 09/30/2007  . Hyperlipidemia 02/17/2007  . ANXIETY 01/08/2007  . Essential hypertension 01/08/2007  . GASTROESOPHAGEAL REFLUX DISEASE 01/08/2007  . Osteoarthritis 01/08/2007    Current Outpatient Prescriptions on File Prior to Visit  Medication Sig Dispense Refill  . aspirin 81 MG tablet Take 1 tablet (81 mg total) by mouth 2 (two) times daily. Resume in 1 week.    [name] atenolol-chlorthalidone (TENORETIC) 50-25 MG tablet Take 1 tablet by mouth daily. 90 tablet 1  . b complex vitamins tablet Take 1 tablet by mouth daily.    . benzonatate (TESSALON) 100 MG capsule Take 1 capsule (100 mg total) by mouth 2 (two) times daily as needed for cough. 60 capsule 0  . Calcium Carbonate-Vitamin D (CALCIUM + D PO) Take 1 tablet by mouth daily.     . cholecalciferol (VITAMIN D) 1000 UNITS tablet Take 1,000 Units by mouth daily.      . Chromium 1 MG CAPS Take 1 mg by mouth daily.     [name] co-enzyme Q-10 30 MG capsule Take  30 mg by mouth 2 (two) times daily.     . diphenoxylate-atropine (LOMOTIL) 2.5-0.025 MG per tablet Take 1 tablet by mouth 4 (four) times daily as needed for diarrhea or loose stools. 90 tablet 0  . folic acid (FOLVITE) 010 MCG tablet Take 400 mcg by mouth daily.    . hyoscyamine (LEVSIN SL) 0.125 MG SL tablet Take one tablet by mouth before meals three times a day 90 tablet 11  . lisinopril (PRINIVIL,ZESTRIL) 2.5 MG tablet Take 1 tablet (2.5 mg total) by mouth daily. 90 tablet 1  . Magnesium 100 MG CAPS Take 100 mg by mouth at bedtime.     [name] MELATONIN ER PO Take 1 tablet by mouth at bedtime.     . methocarbamol (ROBAXIN) 500 MG tablet Take 1 tablet (500 mg total) by mouth every 6 (six) hours as needed for muscle spasms (spasm). 90 tablet 0  . Multiple Vitamin (MULTIVITAMIN) tablet Take 1 tablet by mouth daily.      . potassium chloride SA (KLOR-CON M20) 20 MEQ tablet Take 1 tablet (20 mEq total) by mouth 2 (two) times daily. 180 tablet 3  . pyridOXINE (VITAMIN B-6) 100 MG tablet Take 100 mg by mouth daily.    . simvastatin (ZOCOR) 20 MG tablet Take 1 tablet (20 mg total) by mouth daily. 90 tablet 1  . Zinc Sulfate (ZINC  15 PO) Take 1 tablet by mouth daily.      No current facility-administered medications on file prior to visit.    Past Medical History  Diagnosis Date  . Elevated cholesterol     takes Niacin daily and Simvastatin  . PONV (postoperative nausea and vomiting)   . GERD (gastroesophageal reflux disease) 06/2004    non-specific gastritis on EGD 06/2004  . Mucoid cyst of joint 08/2013    right index finger  . Arthritis     back, fingers with joint pain and swelling.  chronic back pain  . Seasonal allergies   . Urge incontinence   . Osteopenia 01/2014    T score -1.1 FRAX 14%/0.5%. Stable from prior DEXA  . Hypertension     takes Tenoretic and Lisinopril daily  . Headache(784.0)     occasionally  . Stroke ([place]) 1998    x 2 - mild left-sided weakness  . Chronic back pain       arthritis   . History of colon polyps 2004, 2009    2004:adenomatous. 2009 hyperplastic.   [name] Uterine prolapse   . Diverticulosis     Past Surgical History  Procedure Laterality Date  . Oophorectomy Right 2000  . Tubal ligation    . Back surgery      x 2  . Gynecologic cryosurgery    . Nasal septum surgery    . Hysteroscopy w/d&c  12/07/2010    with resection of endometrial polyp  . Knee arthroscopy Left 2008  . Gum surgery    . Colonoscopy  2004, 2009, 2014  . Mass excision Right 08/15/2013    Procedure: RIGHT INDEX EXCISION MASS ;  Surgeon: Tennis Must, MD;  Location: [place] [facility];  Service: Orthopedics;  Laterality: Right;  . Lip biopsy      done at MD office Fri 11/22/13  . Shoulder arthroscopy with open rotator cuff repair and distal clavicle acrominectomy Right 11/26/2013    Procedure: RIGHT SHOULDER ARTHROSCOPY WITH MINI OPEN ROTATOR CUFF REPAIR AND DISTAL CLAVICLE RESECTION, SUBACROMIAL DECOMPRESSION, POSSIBLE [facility] PATCH.;  Surgeon: [name], MD;  Location: [facility];  Service: Orthopedics;  Laterality: Right;  . Finger surgery      Social History   Social History  . Marital Status: Married    Spouse Name: N/A  . Number of Children: 2  . Years of Education: 12   Occupational History  . [employer]     Disability/Retired   Social History Main Topics  . Smoking status: Passive Smoke Exposure - Never Smoker  . Smokeless tobacco: Never Used  . Alcohol Use: 0.0 oz/week    0 Standard drinks or equivalent per week     Comment: rarely  . Drug Use: No  . Sexual Activity:    Partners: Male    Birth Control/ Protection: Surgical, Post-menopausal     Comment: BTL-1st intercourse 64 yo-More than 5 partners   Other Topics Concern  . Not on file   Social History Narrative   [name] grew up in [place], [place]. She lives with her husband [name]). She has 1 daughter [name]), 1 son [name]) and 2 stepchildren ([name]).  They have 3 dogs ([name], [name], [name]) and 3 cats ([name], [name], [name]). She enjoys watching movies and outdoors activities.      Exercise - none   Caffeine - varies depending on the day. May have between 1-4 cups daily  Family History  Problem Relation Age of Onset  . Hypertension Mother   . Heart disease Mother   . Diabetes Father   . Hypertension Father   . Heart disease Father   . Stroke Father   . Diabetes Maternal Aunt   . Diabetes Paternal Grandmother   . Hypertension Paternal Grandfather   . Stroke Paternal Grandfather     Review of Systems  Constitutional: Positive for fever (subjective), chills and appetite change.  HENT: Negative for congestion, ear pain, sinus pressure and sore throat.   Respiratory: Positive for cough, shortness of breath and wheezing.   Gastrointestinal: Positive for nausea. Negative for abdominal pain and diarrhea.  Musculoskeletal: Positive for myalgias.  Neurological: Positive for headaches. Negative for dizziness and light-headedness.       Objective:   Filed Vitals:   07/04/15 0920  BP: 108/78  Pulse: 102  Temp: 99.1 F (37.3 C)  Resp: 18   Filed Weights   07/04/15 0920  Weight: 171 lb (77.565 kg)   Body mass index is 31.27 kg/(m^2).   Physical Exam GENERAL APPEARANCE: Appears stated age, well appearing, NAD EYES: conjunctiva clear, no icterus HEENT: bilateral tympanic membranes and ear canals normal, oropharynx with no erythema, no thyromegaly, trachea midline, no cervical or supraclavicular lymphadenopathy LUNGS: unlabored breathing, good air entry bilaterally, no crackles, diffuse moderate expiratory wheeze HEART: Normal S1,S2 without murmurs EXTREMITIES: trace edema      Assessment & Plan:   Acute bronchitis with moderate expiratory wheeze Zpak Prednisone taper ( discussed possible side effects), advised to take with food Hydromet cough syrup Over the counter cold medications Rest, fluids Call if no  improvement

## 2015-10-11 ENCOUNTER — Other Ambulatory Visit: Payer: Self-pay | Admitting: Internal Medicine

## 2015-11-17 ENCOUNTER — Other Ambulatory Visit: Payer: Self-pay | Admitting: Internal Medicine

## 2015-11-20 ENCOUNTER — Ambulatory Visit (INDEPENDENT_AMBULATORY_CARE_PROVIDER_SITE_OTHER): Payer: 59 | Admitting: Gynecology

## 2015-11-20 ENCOUNTER — Encounter: Payer: Self-pay | Admitting: Gynecology

## 2015-11-20 VITALS — BP 120/70 | Ht 63.0 in | Wt 173.0 lb

## 2015-11-20 DIAGNOSIS — Z01419 Encounter for gynecological examination (general) (routine) without abnormal findings: Secondary | ICD-10-CM

## 2015-11-20 DIAGNOSIS — M8580 Other specified disorders of bone density and structure, unspecified site: Secondary | ICD-10-CM | POA: Diagnosis not present

## 2015-11-20 DIAGNOSIS — N8111 Cystocele, midline: Secondary | ICD-10-CM | POA: Diagnosis not present

## 2015-11-20 DIAGNOSIS — N952 Postmenopausal atrophic vaginitis: Secondary | ICD-10-CM

## 2015-11-20 NOTE — Progress Notes (Signed)
    [name] [birth year] [id number]        65 y.o.  G2P2002  for annual exam.  Doing well.  Past medical history,surgical history, problem list, medications, allergies, family history and social history were all reviewed and documented as reviewed in the EPIC chart.  ROS:  Performed with pertinent positives and negatives included in the history, assessment and plan.   Additional significant findings :  None   Exam: Caryn Bee assistant Vitals:   11/20/15 1407  BP: 120/70  Weight: 173 lb (78.5 kg)  Height: 5' 3"  (1.6 m)   Body mass index is 30.65 kg/m.  General appearance:  Normal affect, orientation and appearance. Skin: Grossly normal HEENT: Without gross lesions.  No cervical or supraclavicular adenopathy. Thyroid normal.  Lungs:  Clear without wheezing, rales or rhonchi Cardiac: RR, without RMG Abdominal:  Soft, nontender, without masses, guarding, rebound, organomegaly or hernia Breasts:  Examined lying and sitting without masses, retractions, discharge or axillary adenopathy. Pelvic:  Ext/BUS/Vagina with atrophic changes.  First to second-degree cystocele noted. Mild uterine prolapse. No significant rectocele  Cervix with atrophic changes  Uterus anteverted, normal size, shape and contour, midline and mobile nontender   Adnexa without masses or tenderness    Anus and perineum normal   Rectovaginal normal sphincter tone without palpated masses or tenderness.    Assessment/Plan:  65 y.o. [id number] female for annual exam .   1. Postmenopausal/atrophic genital changes. No significant hot flushes, night sweats, vaginal dryness or any vaginal bleeding. Continue to monitor and report any issues or bleeding. 2. Cystocele. First to second-degree cystocele. Asymptomatic to the patient. Continue to monitor report any issues. 3. Osteopenia. DEXA 2015 T score -1.1 FRAX 14%/0.5%. Stable from prior DEXA. We'll plan repeat DEXA and another year or 2. 4. Pap smear/HPV 2015  negative. No Pap smear done today. History of cryosurgery a number of years ago was normal Paps since then. 5. Colonoscopy 2014. Repeat at their recommended interval. 6. Mammography scheduled in 2 weeks. SBE monthly reviewed. 7. Health maintenance. No routine lab work done as patient reports this done elsewhere. Follow up 1 year, sooner as needed.   Anastasio Auerbach MD, 2:31 PM 11/20/2015

## 2015-11-20 NOTE — Patient Instructions (Signed)

## 2015-11-21 LAB — URINALYSIS W MICROSCOPIC + REFLEX CULTURE
BACTERIA UA: NONE SEEN [HPF]
BILIRUBIN URINE: NEGATIVE
CRYSTALS: NONE SEEN [HPF]
Casts: NONE SEEN [LPF]
Glucose, UA: NEGATIVE
HGB URINE DIPSTICK: NEGATIVE
KETONES UR: NEGATIVE
LEUKOCYTES UA: NEGATIVE
NITRITE: NEGATIVE
PROTEIN: NEGATIVE
RBC / HPF: NONE SEEN RBC/HPF (ref <=2)
Specific Gravity, Urine: 1.02 (ref 1.001–1.035)
Squamous Epithelial / LPF: NONE SEEN [HPF] (ref <=5)
WBC, UA: NONE SEEN WBC/HPF (ref <=5)
Yeast: NONE SEEN [HPF]
pH: 7.5 (ref 5.0–8.0)

## 2015-12-08 ENCOUNTER — Encounter: Payer: Self-pay | Admitting: Internal Medicine

## 2015-12-08 ENCOUNTER — Ambulatory Visit (INDEPENDENT_AMBULATORY_CARE_PROVIDER_SITE_OTHER): Payer: No Typology Code available for payment source | Admitting: Internal Medicine

## 2015-12-08 ENCOUNTER — Other Ambulatory Visit: Payer: No Typology Code available for payment source

## 2015-12-08 VITALS — BP 118/82 | HR 69 | Temp 97.6°F | Resp 14 | Ht 63.0 in | Wt 172.1 lb

## 2015-12-08 DIAGNOSIS — Z0000 Encounter for general adult medical examination without abnormal findings: Secondary | ICD-10-CM

## 2015-12-08 DIAGNOSIS — E785 Hyperlipidemia, unspecified: Secondary | ICD-10-CM | POA: Diagnosis not present

## 2015-12-08 DIAGNOSIS — Z23 Encounter for immunization: Secondary | ICD-10-CM

## 2015-12-08 DIAGNOSIS — I10 Essential (primary) hypertension: Secondary | ICD-10-CM

## 2015-12-08 LAB — LIPID PANEL
CHOL/HDL RATIO: 4.2 ratio (ref <=5.0)
CHOLESTEROL: 192 mg/dL (ref 125–200)
HDL: 46 mg/dL (ref >=46)
LDL CALC: 128 mg/dL (ref <130)
TRIGLYCERIDES: 91 mg/dL (ref <150)
VLDL: 18 mg/dL (ref <30)

## 2015-12-08 LAB — COMPREHENSIVE METABOLIC PANEL
ALT: 29 U/L (ref 6–29)
AST: 32 U/L (ref 10–35)
Albumin: 4.1 g/dL (ref 3.6–5.1)
Alkaline Phosphatase: 57 U/L (ref 33–130)
BUN: 20 mg/dL (ref 7–25)
CALCIUM: 9.5 mg/dL (ref 8.6–10.4)
CO2: 30 mmol/L (ref 20–31)
Chloride: 103 mmol/L (ref 98–110)
Creat: 0.93 mg/dL (ref 0.50–0.99)
GLUCOSE: 97 mg/dL (ref 65–99)
POTASSIUM: 4 mmol/L (ref 3.5–5.3)
Sodium: 140 mmol/L (ref 135–146)
Total Bilirubin: 0.4 mg/dL (ref 0.2–1.2)
Total Protein: 7 g/dL (ref 6.1–8.1)

## 2015-12-08 NOTE — Progress Notes (Signed)
Pre visit review using our clinic review tool, if applicable. No additional management support is needed unless otherwise documented below in the visit note. 

## 2015-12-08 NOTE — Assessment & Plan Note (Signed)
Given prevnar 13 at visit, declines flu shot and shingles. Checking labs, counseled on the dangers of distracted driving. Counseled on sun safety and mole surveillance. Colonoscopy up to date. Mammogram this week. Given screening recommendations.

## 2015-12-08 NOTE — Assessment & Plan Note (Signed)
BP at goal with atenolol, chlorthalidone and lisinopril. Checking CMP and adjust as needed.

## 2015-12-08 NOTE — Progress Notes (Signed)
   Subjective:    Patient ID: [name], female    DOB: 11-29-1950, 65 y.o.   MRN: [id number]  HPI The patient is a 65 YO female coming in for wellness. No new concerns. [facility] labs  PMH, [facility], social history reviewed and updated.   Review of Systems  Constitutional: Negative for activity change, appetite change, chills, fatigue and unexpected weight change.  HENT: Negative.   Eyes: Negative.   Respiratory: Negative for cough, chest tightness, shortness of breath and wheezing.   Cardiovascular: Negative for chest pain, palpitations and leg swelling.  Gastrointestinal: Positive for diarrhea. Negative for abdominal distention, abdominal pain, blood in stool and constipation.  Musculoskeletal: Positive for arthralgias. Negative for back pain, gait problem and myalgias.  Skin: Negative.   Neurological: Negative.   Psychiatric/Behavioral: Negative.       Objective:   Physical Exam  Constitutional: She is oriented to person, place, and time. She appears well-developed and well-nourished.  HENT:  Head: Normocephalic and atraumatic.  Eyes: EOM are normal.  Cardiovascular: Normal rate and regular rhythm.   Pulmonary/Chest: Effort normal and breath sounds normal. No respiratory distress. She has no wheezes. She has no rales.  Abdominal: Soft. Bowel sounds are normal. She exhibits no distension. There is no tenderness. There is no rebound.  Lymphadenopathy:    She has no cervical adenopathy.  Neurological: She is alert and oriented to person, place, and time. Coordination normal.  Skin: Skin is warm and dry.  Psychiatric: She has a normal mood and affect.   Vitals:   12/08/15 1044  BP: 118/82  Pulse: 69  Resp: 14  Temp: 97.6 F (36.4 C)  TempSrc: Oral  SpO2: 98%  Weight: 172 lb 1.9 oz (78.1 kg)  Height: 5' 3"  (1.6 m)      Assessment & Plan:  Prevnar 13 given at visit

## 2015-12-08 NOTE — Patient Instructions (Signed)
We are checking the labs and will call you back.   We have given you the pneumonia shot today and you will get one in about 1 year then you will be done.  Health Maintenance, Female Adopting a healthy lifestyle and getting preventive care can go a long way to promote health and wellness. Talk with your health care provider about what schedule of regular examinations is right for you. This is a good chance for you to check in with your provider about disease prevention and staying healthy. In between checkups, there are plenty of things you can do on your own. Experts have done a lot of research about which lifestyle changes and preventive measures are most likely to keep you healthy. Ask your health care provider for more information. WEIGHT AND DIET  Eat a healthy diet  Be sure to include plenty of vegetables, fruits, low-fat dairy products, and lean protein.  Do not eat a lot of foods high in solid fats, added sugars, or salt.  Get regular exercise. This is one of the most important things you can do for your health.  Most adults should exercise for at least 150 minutes each week. The exercise should increase your heart rate and make you sweat (moderate-intensity exercise).  Most adults should also do strengthening exercises at least twice a week. This is in addition to the moderate-intensity exercise.  Maintain a healthy weight  Body mass index (BMI) is a measurement that can be used to identify possible weight problems. It estimates body fat based on height and weight. Your health care provider can help determine your BMI and help you achieve or maintain a healthy weight.  For females 65 years of age and older:   A BMI below 18.5 is considered underweight.  A BMI of 18.5 to 24.9 is normal.  A BMI of 25 to 29.9 is considered overweight.  A BMI of 30 and above is considered obese.  Watch levels of cholesterol and blood lipids  You should start having your blood tested for  lipids and cholesterol at 65 years of age, then have this test every 5 years.  You may need to have your cholesterol levels checked more often if:  Your lipid or cholesterol levels are high.  You are older than 65 years of age.  You are at high risk for heart disease.  CANCER SCREENING   Lung Cancer  Lung cancer screening is recommended for adults 65-5 years old who are at high risk for lung cancer because of a history of smoking.  A yearly low-dose CT scan of the lungs is recommended for people who:  Currently smoke.  Have quit within the past 15 years.  Have at least a 30-pack-year history of smoking. A pack year is smoking an average of one pack of cigarettes a day for 1 year.  Yearly screening should continue until it has been 15 years since you quit.  Yearly screening should stop if you develop a health problem that would prevent you from having lung cancer treatment.  Breast Cancer  Practice breast self-awareness. This means understanding how your breasts normally appear and feel.  It also means doing regular breast self-exams. Let your health care provider know about any changes, no matter how small.  If you are in your 20s or 30s, you should have a clinical breast exam (CBE) by a health care provider every 1-3 years as part of a regular health exam.  If you are 40 or older, have  a CBE every year. Also consider having a breast X-ray (mammogram) every year.  If you have a family history of breast cancer, talk to your health care provider about genetic screening.  If you are at high risk for breast cancer, talk to your health care provider about having an MRI and a mammogram every year.  Breast cancer gene (BRCA) assessment is recommended for women who have family members with BRCA-related cancers. BRCA-related cancers include:  Breast.  Ovarian.  Tubal.  Peritoneal cancers.  Results of the assessment will determine the need for genetic counseling and BRCA1  and BRCA2 testing. Cervical Cancer Your health care provider may recommend that you be screened regularly for cancer of the pelvic organs (ovaries, uterus, and vagina). This screening involves a pelvic examination, including checking for microscopic changes to the surface of your cervix (Pap test). You may be encouraged to have this screening done every 3 years, beginning at age 13.  For women ages 66-65, health care providers may recommend pelvic exams and Pap testing every 3 years, or they may recommend the Pap and pelvic exam, combined with testing for human papilloma virus (HPV), every 5 years. Some types of HPV increase your risk of cervical cancer. Testing for HPV may also be done on women of any age with unclear Pap test results.  Other health care providers may not recommend any screening for nonpregnant women who are considered low risk for pelvic cancer and who do not have symptoms. Ask your health care provider if a screening pelvic exam is right for you.  If you have had past treatment for cervical cancer or a condition that could lead to cancer, you need Pap tests and screening for cancer for at least 20 years after your treatment. If Pap tests have been discontinued, your risk factors (such as having a new sexual partner) need to be reassessed to determine if screening should resume. Some women have medical problems that increase the chance of getting cervical cancer. In these cases, your health care provider may recommend more frequent screening and Pap tests. Colorectal Cancer  This type of cancer can be detected and often prevented.  Routine colorectal cancer screening usually begins at 65 years of age and continues through 65 years of age.  Your health care provider may recommend screening at an earlier age if you have risk factors for colon cancer.  Your health care provider may also recommend using home test kits to check for hidden blood in the stool.  A small camera at the  end of a tube can be used to examine your colon directly (sigmoidoscopy or colonoscopy). This is done to check for the earliest forms of colorectal cancer.  Routine screening usually begins at age 48.  Direct examination of the colon should be repeated every 5-10 years through 65 years of age. However, you may need to be screened more often if early forms of precancerous polyps or small growths are found. Skin Cancer  Check your skin from head to toe regularly.  Tell your health care provider about any new moles or changes in moles, especially if there is a change in a mole's shape or color.  Also tell your health care provider if you have a mole that is larger than the size of a pencil eraser.  Always use sunscreen. Apply sunscreen liberally and repeatedly throughout the day.  Protect yourself by wearing long sleeves, pants, a wide-brimmed hat, and sunglasses whenever you are outside. HEART DISEASE, DIABETES, AND HIGH  BLOOD PRESSURE   High blood pressure causes heart disease and increases the risk of stroke. High blood pressure is more likely to develop in:  People who have blood pressure in the high end of the normal range (130-139/85-89 mm Hg).  People who are overweight or obese.  People who are African American.  If you are 18-39 years of age, have your blood pressure checked every 3-5 years. If you are 40 years of age or older, have your blood pressure checked every year. You should have your blood pressure measured twice--once when you are at a hospital or clinic, and once when you are not at a hospital or clinic. Record the average of the two measurements. To check your blood pressure when you are not at a hospital or clinic, you can use:  An automated blood pressure machine at a pharmacy.  A home blood pressure monitor.  If you are between 55 years and 79 years old, ask your health care provider if you should take aspirin to prevent strokes.  Have regular diabetes  screenings. This involves taking a blood sample to check your fasting blood sugar level.  If you are at a normal weight and have a low risk for diabetes, have this test once every three years after 65 years of age.  If you are overweight and have a high risk for diabetes, consider being tested at a younger age or more often. PREVENTING INFECTION  Hepatitis B  If you have a higher risk for hepatitis B, you should be screened for this virus. You are considered at high risk for hepatitis B if:  You were born in a country where hepatitis B is common. Ask your health care provider which countries are considered high risk.  Your parents were born in a high-risk country, and you have not been immunized against hepatitis B (hepatitis B vaccine).  You have HIV or AIDS.  You use needles to inject street drugs.  You live with someone who has hepatitis B.  You have had sex with someone who has hepatitis B.  You get hemodialysis treatment.  You take certain medicines for conditions, including cancer, organ transplantation, and autoimmune conditions. Hepatitis C  Blood testing is recommended for:  Everyone born from 1945 through 1965.  Anyone with known risk factors for hepatitis C. Sexually transmitted infections (STIs)  You should be screened for sexually transmitted infections (STIs) including gonorrhea and chlamydia if:  You are sexually active and are younger than 65 years of age.  You are older than 65 years of age and your health care provider tells you that you are at risk for this type of infection.  Your sexual activity has changed since you were last screened and you are at an increased risk for chlamydia or gonorrhea. Ask your health care provider if you are at risk.  If you do not have HIV, but are at risk, it may be recommended that you take a prescription medicine daily to prevent HIV infection. This is called pre-exposure prophylaxis (PrEP). You are considered at risk  if:  You are sexually active and do not regularly use condoms or know the HIV status of your partner(s).  You take drugs by injection.  You are sexually active with a partner who has HIV. Talk with your health care provider about whether you are at high risk of being infected with HIV. If you choose to begin PrEP, you should first be tested for HIV. You should then be tested every   3 months for as long as you are taking PrEP.  PREGNANCY   If you are premenopausal and you may become pregnant, ask your health care provider about preconception counseling.  If you may become pregnant, take 400 to 800 micrograms (mcg) of folic acid every day.  If you want to prevent pregnancy, talk to your health care provider about birth control (contraception). OSTEOPOROSIS AND MENOPAUSE   Osteoporosis is a disease in which the bones lose minerals and strength with aging. This can result in serious bone fractures. Your risk for osteoporosis can be identified using a bone density scan.  If you are 55 years of age or older, or if you are at risk for osteoporosis and fractures, ask your health care provider if you should be screened.  Ask your health care provider whether you should take a calcium or vitamin D supplement to lower your risk for osteoporosis.  Menopause may have certain physical symptoms and risks.  Hormone replacement therapy may reduce some of these symptoms and risks. Talk to your health care provider about whether hormone replacement therapy is right for you.  HOME CARE INSTRUCTIONS   Schedule regular health, dental, and eye exams.  Stay current with your immunizations.   Do not use any tobacco products including cigarettes, chewing tobacco, or electronic cigarettes.  If you are pregnant, do not drink alcohol.  If you are breastfeeding, limit how much and how often you drink alcohol.  Limit alcohol intake to no more than 1 drink per day for nonpregnant women. One drink equals 12  ounces of beer, 5 ounces of wine, or 1 ounces of hard liquor.  Do not use street drugs.  Do not share needles.  Ask your health care provider for help if you need support or information about quitting drugs.  Tell your health care provider if you often feel depressed.  Tell your health care provider if you have ever been abused or do not feel safe at home.   This information is not intended to replace advice given to you by your health care provider. Make sure you discuss any questions you have with your health care provider.   Document Released: 10/11/2010 Document Revised: 04/18/2014 Document Reviewed: 02/27/2013 Elsevier Interactive Patient Education [employer].

## 2015-12-08 NOTE — Assessment & Plan Note (Signed)
Checking lipid panel and adjust as needed.

## 2015-12-28 ENCOUNTER — Telehealth: Payer: Self-pay | Admitting: *Deleted

## 2015-12-28 ENCOUNTER — Telehealth: Payer: Self-pay | Admitting: Gastroenterology

## 2015-12-28 NOTE — Telephone Encounter (Signed)
Notified pt w/MD response.../lmb 

## 2015-12-28 NOTE — Telephone Encounter (Signed)
Called pt w/MD response pt states she been taking the Lomotil, but med is not working for her...[name]

## 2015-12-28 NOTE — Telephone Encounter (Signed)
Would recommend visit and she can try adding imodium but there are not other diarrhea medicines that are prescription. She can increase the amount of lomotil and use imodium 2 pills every 4 hours as needed.

## 2015-12-28 NOTE — Telephone Encounter (Signed)
Left msg on triage stating she has had diarrhea x's 5 days. The otc meds is not working requesting MD to call in something for sxs...[name]

## 2015-12-28 NOTE — Telephone Encounter (Signed)
Patient states her Lomotil is not working for her diarrhea that she has had for 5 days and wants to know if there is something else we can send in it's place. Informed patient that we have not seen her in a year and she would need to be reevaluated in the office by Dr. Fuller Plan before we send in another medications for diarrhea. Informed patient the best thing to do is call her PCP since she was just seen in the office to see if they can send in another medication. Told patient I can schedule her a follow up with Dr. Fuller Plan or an APP if she likes. Patient states she will call her PCP and try to get them to send in something else since they orginially prescribed Lomotil. Patient states she will call back and schedule a follow up with Dr. Fuller Plan if she needs to.

## 2015-12-28 NOTE — Telephone Encounter (Signed)
She was given rx for lomotil several weeks ago, this is the prescription medication for diarrhea. She can use that.

## 2015-12-29 ENCOUNTER — Other Ambulatory Visit: Payer: Self-pay | Admitting: Internal Medicine

## 2015-12-29 NOTE — Telephone Encounter (Signed)
Faxed to pharmacy

## 2016-02-12 ENCOUNTER — Other Ambulatory Visit: Payer: Self-pay | Admitting: Internal Medicine

## 2016-04-13 ENCOUNTER — Other Ambulatory Visit: Payer: Self-pay | Admitting: Internal Medicine

## 2016-04-20 ENCOUNTER — Other Ambulatory Visit: Payer: Self-pay | Admitting: Internal Medicine

## 2016-05-15 ENCOUNTER — Other Ambulatory Visit: Payer: Self-pay | Admitting: Internal Medicine

## 2016-05-16 ENCOUNTER — Other Ambulatory Visit: Payer: Self-pay | Admitting: Internal Medicine

## 2016-06-21 ENCOUNTER — Ambulatory Visit (INDEPENDENT_AMBULATORY_CARE_PROVIDER_SITE_OTHER): Payer: Self-pay

## 2016-06-21 ENCOUNTER — Encounter (INDEPENDENT_AMBULATORY_CARE_PROVIDER_SITE_OTHER): Payer: Self-pay | Admitting: Orthopaedic Surgery

## 2016-06-21 ENCOUNTER — Ambulatory Visit (INDEPENDENT_AMBULATORY_CARE_PROVIDER_SITE_OTHER): Payer: 59 | Admitting: Orthopaedic Surgery

## 2016-06-21 VITALS — BP 99/71 | HR 77 | Ht 64.0 in | Wt 165.0 lb

## 2016-06-21 DIAGNOSIS — G8929 Other chronic pain: Secondary | ICD-10-CM | POA: Diagnosis not present

## 2016-06-21 DIAGNOSIS — M25562 Pain in left knee: Secondary | ICD-10-CM | POA: Diagnosis not present

## 2016-06-21 DIAGNOSIS — M1711 Unilateral primary osteoarthritis, right knee: Secondary | ICD-10-CM

## 2016-06-21 DIAGNOSIS — M1712 Unilateral primary osteoarthritis, left knee: Secondary | ICD-10-CM

## 2016-06-21 DIAGNOSIS — M25561 Pain in right knee: Secondary | ICD-10-CM | POA: Diagnosis not present

## 2016-06-21 MED ORDER — METHYLPREDNISOLONE ACETATE 40 MG/ML IJ SUSP
80.0000 mg | INTRAMUSCULAR | Status: AC | PRN
  Administered 2016-06-21: 80 mg

## 2016-06-21 MED ORDER — LIDOCAINE HCL 1 % IJ SOLN
5.0000 mL | INTRAMUSCULAR | Status: AC | PRN
  Administered 2016-06-21: 5 mL

## 2016-06-21 MED ORDER — BUPIVACAINE HCL 0.5 % IJ SOLN
3.0000 mL | INTRAMUSCULAR | Status: AC | PRN
  Administered 2016-06-21: 3 mL via INTRA_ARTICULAR

## 2016-06-21 NOTE — Progress Notes (Signed)
Office Visit Note   Patient: [name]           Date of Birth: September 28, 1950           MRN: [id number] Visit Date: 06/21/2016              Requested by: Stacy Koch, MD [place], [place] [id number] PCP: Stacy Koch, MD   Assessment & Plan: Visit Diagnoses:  1. Unilateral primary osteoarthritis, right knee   2. Chronic pain of left knee   3. Chronic pain of right knee   4. Unilateral primary osteoarthritis, left knee     Plan:  #1: Injection of the right knee with corticosteroid accomplished atraumatically. #2: Precertification for Euflexxa for the right knee #3: Once precertified should and she returns we can also inject the left knee with corticosteroid.   Follow-Up Instructions: Return if symptoms worsen or fail to improve, for once approved for visco.   Orders:  Orders Placed This Encounter  Procedures  . XR Knee Complete 4 Views Left  . XR Knee Complete 4 Views Right   No orders of the defined types were placed in this encounter.     Procedures: Large Joint Inj Date/Time: 06/21/2016 2:45 PM Performed by: Biagio Borg D Authorized by: Biagio Borg D   Consent Given by:  Patient Timeout: prior to procedure the correct patient, procedure, and site was verified   Indications:  Pain and joint swelling Location:  Knee Site:  R knee Prep: patient was prepped and draped in usual sterile fashion   Needle Size:  25 G Needle Length:  1.5 inches Approach:  Anteromedial Ultrasound Guidance: No   Fluoroscopic Guidance: No   Arthrogram: No   Medications:  5 mL lidocaine 1 %; 80 mg methylPREDNISolone acetate 40 MG/ML; 3 mL bupivacaine 0.5 % Aspiration Attempted: No   Patient tolerance:  Patient tolerated the procedure well with no immediate complications     Clinical Data: No additional findings.   Subjective: Chief Complaint  Patient presents with  . Right Knee - Pain    [name] presents today with chronic  right knee pain. The pain is very bad and relates it hurts to walk up and down stairs, getting up from sitting position and walking irritates the right knee. Ibuprofen and tylenol for pain. Pt has tolerated cortisone injections in the past very well with good results.    Review of Systems  Constitutional: Negative.   HENT: Negative.   Respiratory: Negative.   Cardiovascular: Negative.   Gastrointestinal: Negative.   Genitourinary: Negative.   Skin: Negative.   Neurological: Negative.   Hematological: Negative.   Psychiatric/Behavioral: Negative.      Objective: Vital Signs: BP 99/71   Pulse 77   Ht 5' 4"  (1.626 m)   Wt 165 lb (74.8 kg)   LMP 02/09/2005   BMI 28.32 kg/m   Physical Exam  Constitutional: She is oriented to person, place, and time. She appears well-developed and well-nourished.  HENT:  Head: Normocephalic and atraumatic.  Eyes: EOM are normal. Pupils are equal, round, and reactive to light.  Pulmonary/Chest: Effort normal.  Neurological: She is alert and oriented to person, place, and time.  Skin: Skin is warm and dry.  Psychiatric: She has a normal mood and affect. Her behavior is normal. Judgment and thought content normal.    Right Knee Exam   Range of Motion  Right knee extension: 3.  Right knee flexion: 95.  Other  Erythema: absent Scars: absent Sensation: normal Pulse: present Swelling: mild  Comments:  Crepitus patellofemoral area with range of motion. Does have severe laxity with valgus stressing opening medially with a good endpoint.   Left Knee Exam   Range of Motion  Left knee extension: 0.  Left knee flexion: 103.   Other  Erythema: absent Scars: absent Sensation: normal Pulse: present Swelling: mild  Comments:  Crepitus patellofemoral area with range of motion. Does have severe laxity with valgus stressing opening medially with a good endpoint.      Specialty Comments:  No specialty comments  available.  Imaging: Xr Knee Complete 4 Views Left  Result Date: 06/21/2016 4 view x-ray of the left knee reveals on bone medial compartment OA with. He is spurring both the medial tibial plateau and femoral condyle. Sclerosing of the medial femoral condyle And Medial Tibial Plateau Is Noted. Also Has Patellofemoral Lateral Joint Space Narrowing and Degenerative Changes Noted.  Xr Knee Complete 4 Views Right  Result Date: 06/21/2016 Ray of the right knee reveals essentially bone-on-bone medial compartment the OA. There is periarticular spurring noted more on the tibial plateau medially than the the condyle. She also has significant patellofemoral OA noted the joint space narrowing bilaterally. Little bit of calcification at the insertion of the quadriceps tendon at the superior pole patella.    PMFS History: Patient Active Problem List   Diagnosis Date Noted  . Chronic diarrhea 12/07/2014  . Segmental colitis with rectal bleeding ([facility]) 09/12/2014  . Routine general medical examination at a health care facility 06/05/2014  . Overweight 06/13/2011  . ABNORMAL CHEST XRAY 09/30/2007  . Hyperlipidemia 02/17/2007  . Essential hypertension 01/08/2007  . Osteoarthritis 01/08/2007   Past Medical History:  Diagnosis Date  . Arthritis    back, fingers with joint pain and swelling.  chronic back pain  . Chronic back pain    arthritis   . Diverticulosis   . Elevated cholesterol    takes Niacin daily and Simvastatin  . GERD (gastroesophageal reflux disease) 06/2004   non-specific gastritis on EGD 06/2004  . Headache(784.0)    occasionally  . History of colon polyps 2004, 2009   2004:adenomatous. 2009 hyperplastic.   Marland Kitchen Hypertension    takes Tenoretic and Lisinopril daily  . Mucoid cyst of joint 08/2013   right index finger  . Osteopenia 01/2014   T score -1.1 FRAX 14%/0.5%. Stable from prior DEXA  . PONV (postoperative nausea and vomiting)   . Seasonal allergies   . Stroke ([facility]) 1998    x 2 - mild left-sided weakness  . Urge incontinence   . Uterine prolapse     Family History  Problem Relation Age of Onset  . Hypertension Mother   . Heart disease Mother   . Diabetes Father   . Hypertension Father   . Heart disease Father   . Stroke Father   . Diabetes Maternal Aunt   . Diabetes Paternal Grandmother   . Hypertension Paternal Grandfather   . Stroke Paternal Grandfather     Past Surgical History:  Procedure Laterality Date  . BACK SURGERY     x 2  . COLONOSCOPY  2004, 2009, 2014  . FINGER SURGERY    . GUM SURGERY    . GYNECOLOGIC CRYOSURGERY    . HYSTEROSCOPY W/D&C  12/07/2010   with resection of endometrial polyp  . KNEE ARTHROSCOPY Left 2008  . lip biopsy     done at MD office Fri 11/22/13  .  MASS EXCISION Right 08/15/2013   Procedure: RIGHT INDEX EXCISION MASS ;  Surgeon: Tennis Must, MD;  Location: [facility];  Service: Orthopedics;  Laterality: Right;  . NASAL SEPTUM SURGERY    . OOPHORECTOMY Right 2000  . SHOULDER ARTHROSCOPY WITH OPEN ROTATOR CUFF REPAIR AND DISTAL CLAVICLE ACROMINECTOMY Right 11/26/2013   Procedure: RIGHT SHOULDER ARTHROSCOPY WITH MINI OPEN ROTATOR CUFF REPAIR AND DISTAL CLAVICLE RESECTION, SUBACROMIAL DECOMPRESSION, POSSIBLE [facility] PATCH.;  Surgeon: [name], MD;  Location: [facility];  Service: Orthopedics;  Laterality: Right;  . TUBAL LIGATION     Social History   Occupational History  . [employer]     Disability/Retired   Social History Main Topics  . Smoking status: Passive Smoke Exposure - Never Smoker  . Smokeless tobacco: Never Used  . Alcohol use 0.0 oz/week     Comment: rarely  . Drug use: No  . Sexual activity: Yes    Partners: Male    Birth control/ protection: Surgical, Post-menopausal     Comment: BTL-1st intercourse 66 yo-More than 5 partners

## 2016-06-22 ENCOUNTER — Telehealth (INDEPENDENT_AMBULATORY_CARE_PROVIDER_SITE_OTHER): Payer: Self-pay | Admitting: Orthopaedic Surgery

## 2016-06-22 NOTE — Telephone Encounter (Signed)
Patient left a message inquiring about the gel that was used during her last visit.  She stated it worked perfectly and was wanting to know what the name of the gel is, will her insurance cover the gel, and if Medicare part D will cover it as well.  She does not have that yet, but will in July.  Cb#[id number].  Thank you.

## 2016-07-11 ENCOUNTER — Other Ambulatory Visit: Payer: Self-pay | Admitting: *Deleted

## 2016-07-11 MED ORDER — POTASSIUM CHLORIDE CRYS ER 20 MEQ PO TBCR: 20.0000 meq | EXTENDED_RELEASE_TABLET | Freq: Two times a day (BID) | ORAL | 1 refills | Status: DC

## 2016-07-11 MED ORDER — ATENOLOL-CHLORTHALIDONE 50-25 MG PO TABS: 1.0000 | ORAL_TABLET | Freq: Every day | ORAL | 1 refills | Status: DC

## 2016-07-11 NOTE — Telephone Encounter (Signed)
Pt left msg on triage requesting refill on her Atenolol & Klor con sent to [facility]. Verified chart pt is due for yearly appt in August sent rx to mail service...[name]

## 2016-07-19 ENCOUNTER — Other Ambulatory Visit (INDEPENDENT_AMBULATORY_CARE_PROVIDER_SITE_OTHER): Payer: Self-pay

## 2016-07-21 ENCOUNTER — Telehealth (INDEPENDENT_AMBULATORY_CARE_PROVIDER_SITE_OTHER): Payer: Self-pay | Admitting: Orthopaedic Surgery

## 2016-07-21 NOTE — Telephone Encounter (Signed)
Patient would like to know if her injections have been approved? Patient states she was told they were approved but she has an egg sensitivity so the pharmacy told her she should get another kind of injection. So she is wanting to know if the "other kind" has been approved. She did not know the name of any of the injections.

## 2016-07-26 ENCOUNTER — Telehealth: Payer: Self-pay | Admitting: *Deleted

## 2016-07-26 NOTE — Telephone Encounter (Signed)
Pt left msg on triage requesting refill on her Lom[facility]il to be [facility]...[name]

## 2016-07-27 ENCOUNTER — Telehealth: Payer: Self-pay | Admitting: Orthopaedic Surgery

## 2016-07-27 ENCOUNTER — Other Ambulatory Visit: Payer: Self-pay | Admitting: Internal Medicine

## 2016-07-27 MED ORDER — DIPHENOXYLATE-ATROPINE 2.5-0.025 MG PO TABS: ORAL_TABLET | ORAL | 0 refills | Status: DC

## 2016-07-27 NOTE — Telephone Encounter (Signed)
Patient called stating that the prescription that was called in (Gel 1) for the patient was not recommend by her pharmacist because of her egg sensitivity.  She wants to know if there is anything else that can be called in that does not have egg sensitivity.  CB#[phone].  Thank you.

## 2016-07-27 NOTE — Telephone Encounter (Signed)
MD printed and signed script will fax once phone system comes back up...[name]

## 2016-07-27 NOTE — Telephone Encounter (Signed)
Printed and signed.  

## 2016-07-27 NOTE — Telephone Encounter (Signed)
See message.

## 2016-07-28 ENCOUNTER — Telehealth (INDEPENDENT_AMBULATORY_CARE_PROVIDER_SITE_OTHER): Payer: Self-pay

## 2016-07-28 NOTE — Telephone Encounter (Signed)
Spoke with [name] regarding her Gel-One visco supplementation. She states she spoke with a pharmacsit and she was told not to use as she has an egg sensitivity.  Dr. Koleen Nimrod is researching the side effects and I will keep [name] informed.

## 2016-07-28 NOTE — Telephone Encounter (Signed)
Phone are still not working, called refill in on my cellphone spoke w/pharmacist gave md approval for lomotil. Called pt inform [facility].../lmb

## 2016-08-10 ENCOUNTER — Telehealth: Payer: Self-pay | Admitting: Pharmacist

## 2016-08-10 NOTE — Telephone Encounter (Signed)
I was asked to review visco options for this patient who has an egg allergy.  I reviewed options and discussed with Dr. Durward Fortes.  He would prefer to use Euflexxa if possible.  Per review of Euflexxa package insert there is no mention of egg/avian/chicken.  I also submitted inquiry to the drug company and received confirmation from [facility] that [place] does not contain egg (Reference # [id number]).    Elisabeth Most, Pharm.D., BCPS, CPP Clinical Pharmacist Pager: [phone] Phone: [phone] 08/10/2016 3:41 PM

## 2016-08-12 NOTE — Telephone Encounter (Signed)
Please apply for Euflexxa

## 2016-08-17 NOTE — Telephone Encounter (Signed)
In folder, patient is allergic to eggs. Patient can only use Euflexxa, needs appeal.

## 2016-08-18 NOTE — Telephone Encounter (Signed)
IC [facility] and they needed a new Rx Euflexxa, the previous one was cancelled.  I s/w [name] and gave a new Rx Euflexxa, he will get get this entered and processed.  I asked him to fax communication to me at [phone].  Patient is allergic to egg, we Chelise Hanger still need to do another PA if needed.  Will followup.

## 2016-08-22 NOTE — Telephone Encounter (Signed)
PA required, will call to initiate PA.

## 2016-08-24 ENCOUNTER — Encounter (INDEPENDENT_AMBULATORY_CARE_PROVIDER_SITE_OTHER): Payer: Self-pay | Admitting: Radiology

## 2016-08-24 NOTE — Telephone Encounter (Signed)
Euflexxa denied, I have submitted for appeal.   Gelsyn 3 would be ok, per notes, no issue with egg allergy, however Dr Durward Fortes wants to try for Euflexxa first.  Will follow-up.

## 2016-08-26 NOTE — Telephone Encounter (Signed)
I have submitted for auth of Euflexxa, and it has been denied.  Insurance says she has to try and fail Gel-One, Lorenzo, [place], and Visco-3 prior to approval of Euflexxa.  Pt has an egg allergy and can only use Gelsyn-3 out of all these drugs.  Ok to proceed with Gelsyn-3?  I think that is our only option per her insurance.

## 2016-08-26 NOTE — Telephone Encounter (Signed)
Ok-do we have that product?

## 2016-08-29 NOTE — Telephone Encounter (Signed)
thanks

## 2016-08-29 NOTE — Telephone Encounter (Signed)
No we do not, we would have to send through the specialty pharmacy.  I will call insurance and check benefits, and then send through specialty pharmacy. Thank you.

## 2016-08-29 NOTE — Telephone Encounter (Signed)
See notes, we have to use Gelsyn-3.

## 2016-09-01 ENCOUNTER — Other Ambulatory Visit (INDEPENDENT_AMBULATORY_CARE_PROVIDER_SITE_OTHER): Payer: 59

## 2016-09-01 ENCOUNTER — Ambulatory Visit (INDEPENDENT_AMBULATORY_CARE_PROVIDER_SITE_OTHER): Payer: 59 | Admitting: Internal Medicine

## 2016-09-01 ENCOUNTER — Encounter: Payer: Self-pay | Admitting: Internal Medicine

## 2016-09-01 VITALS — BP 122/76 | HR 90 | Temp 97.6°F | Resp 12 | Ht 64.0 in | Wt 167.0 lb

## 2016-09-01 DIAGNOSIS — R110 Nausea: Secondary | ICD-10-CM

## 2016-09-01 LAB — TSH: TSH: 2.33 u[IU]/mL (ref 0.35–4.50)

## 2016-09-01 LAB — LIPASE: Lipase: 40 U/L (ref 11.0–59.0)

## 2016-09-01 LAB — CBC
HCT: 44.3 % (ref 36.0–46.0)
HEMOGLOBIN: 14.9 g/dL (ref 12.0–15.0)
MCHC: 33.7 g/dL (ref 30.0–36.0)
MCV: 87.6 fl (ref 78.0–100.0)
PLATELETS: 155 10*3/uL (ref 150.0–400.0)
RBC: 5.05 Mil/uL (ref 3.87–5.11)
RDW: 13 % (ref 11.5–15.5)
WBC: 8.5 10*3/uL (ref 4.0–10.5)

## 2016-09-01 LAB — COMPREHENSIVE METABOLIC PANEL
ALT: 44 U/L — ABNORMAL HIGH (ref 0–35)
AST: 58 U/L — AB (ref 0–37)
Albumin: 3.9 g/dL (ref 3.5–5.2)
Alkaline Phosphatase: 58 U/L (ref 39–117)
BUN: 10 mg/dL (ref 6–23)
CALCIUM: 9.6 mg/dL (ref 8.4–10.5)
CHLORIDE: 103 meq/L (ref 96–112)
CO2: 29 meq/L (ref 19–32)
CREATININE: 1 mg/dL (ref 0.40–1.20)
GFR: 58.93 mL/min — ABNORMAL LOW (ref >60.00)
Glucose, Bld: 102 mg/dL — ABNORMAL HIGH (ref 70–99)
Potassium: 3.7 mEq/L (ref 3.5–5.1)
Sodium: 138 mEq/L (ref 135–145)
Total Bilirubin: 0.7 mg/dL (ref 0.2–1.2)
Total Protein: 7.3 g/dL (ref 6.0–8.3)

## 2016-09-01 LAB — HEMOGLOBIN A1C: Hgb A1c MFr Bld: 6 % (ref 4.6–6.5)

## 2016-09-01 LAB — VITAMIN D 25 HYDROXY (VIT D DEFICIENCY, FRACTURES): VITD: 43.77 ng/mL (ref 30.00–100.00)

## 2016-09-01 LAB — VITAMIN B12: Vitamin B-12: 874 pg/mL (ref 211–911)

## 2016-09-01 MED ORDER — ONDANSETRON HCL 4 MG PO TABS: 4.0000 mg | ORAL_TABLET | Freq: Three times a day (TID) | ORAL | 0 refills | Status: DC | PRN

## 2016-09-01 NOTE — Progress Notes (Signed)
   Subjective:    Patient ID: [name], female    DOB: May 04, 1950, 66 y.o.   MRN: [id number]  HPI The patient is a 66 YO female coming in for not feeling well. She is not sure exactly what is going on. She is having some more diarrhea in the last several weeks. She does have colitis and flares of diarrhea from time to time. More stress in her life over that time and poor diet choices. She is having some nausea without vomiting. Not eating much in the last 1-2 days but drinking fluids. She is not having diarrhea the last day. She denies any blood in the diarrhea.   Review of Systems  Constitutional: Positive for activity change, appetite change and fatigue. Negative for chills, fever and unexpected weight change.  HENT: Negative.   Eyes: Negative.   Respiratory: Negative.   Cardiovascular: Negative for chest pain, palpitations and leg swelling.  Gastrointestinal: Positive for diarrhea and nausea. Negative for abdominal distention, abdominal pain, anal bleeding, blood in stool, constipation, rectal pain and vomiting.  Musculoskeletal: Negative.   Neurological: Positive for headaches.      Objective:   Physical Exam  Constitutional: She is oriented to person, place, and time. She appears well-developed and well-nourished.  HENT:  Head: Normocephalic and atraumatic.  Eyes: EOM are normal.  Neck: Normal range of motion.  Cardiovascular: Normal rate and regular rhythm.   Pulmonary/Chest: Effort normal. No respiratory distress. She has no wheezes. She has no rales. She exhibits no tenderness.  Abdominal: Soft. Bowel sounds are normal. She exhibits no distension. There is no tenderness. There is no rebound and no guarding.  Neurological: She is alert and oriented to person, place, and time.  Skin: Skin is warm and dry.   Vitals:   09/01/16 0843  BP: 122/76  Pulse: 90  Resp: 12  Temp: 97.6 F (36.4 C)  TempSrc: Oral  SpO2: 99%  Weight: 167 lb (75.8 kg)  Height: 5' 4"  (1.626 m)        Assessment & Plan:

## 2016-09-01 NOTE — Patient Instructions (Signed)
We will check the labs today and call you back with the results.   We have sent in a medicine for nausea to see if this can help break the cycle.

## 2016-09-01 NOTE — Assessment & Plan Note (Signed)
Checking labs today for alternate cause of malaise and nausea including CMP, lipase, CBC, thyroid, B12, vitamin D levels. Rx for zofran and encouraged to get back on track with diet as well as gradually increase back her calorie intake to normal to help with energy levels.

## 2016-09-07 ENCOUNTER — Telehealth (INDEPENDENT_AMBULATORY_CARE_PROVIDER_SITE_OTHER): Payer: Self-pay | Admitting: Orthopaedic Surgery

## 2016-09-07 ENCOUNTER — Other Ambulatory Visit (INDEPENDENT_AMBULATORY_CARE_PROVIDER_SITE_OTHER): Payer: Self-pay | Admitting: Radiology

## 2016-09-07 MED ORDER — SODIUM HYALURONATE (VISCOSUP) 16.8 MG/2ML IX SOSY: 1.0000 | PREFILLED_SYRINGE | INTRA_ARTICULAR | 0 refills | Status: DC

## 2016-09-07 MED ORDER — SODIUM HYALURONATE (VISCOSUP) 16.8 MG/2ML IX SOSY: 1.0000 | PREFILLED_SYRINGE | INTRA_ARTICULAR | 0 refills | Status: AC

## 2016-09-07 NOTE — Telephone Encounter (Signed)
[facility]  Thank you.

## 2016-09-07 NOTE — Telephone Encounter (Signed)
Rx sent to [facility].  Will followup

## 2016-09-13 NOTE — Telephone Encounter (Signed)
Can you please call patient and sched Euflexxa bilateral knees, x 5, buy and bill?  I will send all paperwork in for scanning, thanks. (Euflexxa is the injection Dr Durward Fortes wanted to use, but insurance originally denied.  Euflexxa is safe for her to use, given her egg allergy.)  Thanks.  We have now received an approval for Euflexxa from [facility].  Auth # [id number], dated 08/30/2016.

## 2016-09-28 NOTE — Telephone Encounter (Signed)
Patient needs to pay her deductible before the medication will be delivered to our office.  She stated that she will try and take care of this in the next few days or so.

## 2016-10-02 ENCOUNTER — Other Ambulatory Visit: Payer: Self-pay | Admitting: Internal Medicine

## 2016-10-17 ENCOUNTER — Other Ambulatory Visit (INDEPENDENT_AMBULATORY_CARE_PROVIDER_SITE_OTHER): Payer: Medicare Other

## 2016-10-17 ENCOUNTER — Ambulatory Visit (INDEPENDENT_AMBULATORY_CARE_PROVIDER_SITE_OTHER): Payer: Medicare Other | Admitting: Gastroenterology

## 2016-10-17 ENCOUNTER — Encounter: Payer: Self-pay | Admitting: Gastroenterology

## 2016-10-17 VITALS — BP 118/60 | HR 72 | Ht 62.0 in | Wt 167.4 lb

## 2016-10-17 DIAGNOSIS — R159 Full incontinence of feces: Secondary | ICD-10-CM

## 2016-10-17 DIAGNOSIS — R197 Diarrhea, unspecified: Secondary | ICD-10-CM

## 2016-10-17 LAB — IGA: IGA: 353 mg/dL (ref 68–378)

## 2016-10-17 NOTE — Patient Instructions (Signed)
Your physician has requested that you go to the basement for lab work before leaving today.  Stop taking your magnesium to see if this helps improve your symptoms. If you see no improvement in your symptoms then start the fod-map diet given at your visit.  Thank you for choosing me and [facility].  Pricilla Riffle. Dagoberto Ligas., MD., Marval Regal

## 2016-10-17 NOTE — Progress Notes (Signed)
    History of Present Illness: This is a 66 year old female with chronic diarrhea and incontinence. Diarrhea evaluation in 2016: GI pathogen panel was negative. CMP in 08/2016 showed AST=58, ALT=44 gluc=104, CBC normal, lipase normal, TSH normal. She relates problems with intermittent constipation but more frequent problems with diarrhea and incontinence. She notes multiple foods that cause a worsening of her diarrhea but even avoiding these foods she has persistent symptoms. She states that gluten, lactose, eggs and pineapple exacerbates diarrhea. Denies weight loss, abdominal pain, change in stool caliber, melena, hematochezia, nausea, vomiting, dysphagia, reflux symptoms, chest pain.  Current Medications, Allergies, Past Medical History, Past Surgical History, Family History and Social History were reviewed in [employer] record.  Physical Exam: General: Well developed, well nourished, no acute distress Head: Normocephalic and atraumatic Eyes:  sclerae anicteric, EOMI Ears: Normal auditory acuity Mouth: No deformity or lesions Lungs: Clear throughout to auscultation Heart: Regular rate and rhythm; no murmurs, rubs or bruits Abdomen: Soft, non tender and non distended. No masses, hepatosplenomegaly or hernias noted. Normal Bowel sounds Rectal: not done Musculoskeletal: Symmetrical with no gross deformities  Pulses:  Normal pulses noted Extremities: No clubbing, cyanosis, edema or deformities noted Neurological: Alert oriented x 4, grossly nonfocal Psychological:  Alert and cooperative. Normal mood and affect  Assessment and Recommendations:  1. Diarrhea, frequent fecal incontinence, occasional constipation. Hold Mg and if diarrhea persist begin a  low FODMAP diet. Send stool for GI pathogen panel and fecal elastase. Send tTG and IgA. If above unremarkable in symptoms persist will treat for possible SIBO and proceed with colonoscopy for further evaluation. Continue  hyoscyamine 0.125 1-2 every 4 hours when necessary. Continue Lomotil twice a day when necessary. REV in 6 weeks.

## 2016-10-18 ENCOUNTER — Other Ambulatory Visit: Payer: Medicare Other

## 2016-10-18 DIAGNOSIS — R197 Diarrhea, unspecified: Secondary | ICD-10-CM

## 2016-10-18 LAB — TISSUE TRANSGLUTAMINASE, IGA: TISSUE TRANSGLUTAMINASE AB, IGA: 1 U/mL (ref <4)

## 2016-10-19 ENCOUNTER — Ambulatory Visit (INDEPENDENT_AMBULATORY_CARE_PROVIDER_SITE_OTHER): Payer: Medicare Other | Admitting: Orthopedic Surgery

## 2016-10-19 ENCOUNTER — Encounter (INDEPENDENT_AMBULATORY_CARE_PROVIDER_SITE_OTHER): Payer: Self-pay | Admitting: Orthopedic Surgery

## 2016-10-19 VITALS — BP 99/71 | HR 78 | Ht 62.0 in | Wt 167.0 lb

## 2016-10-19 DIAGNOSIS — M1712 Unilateral primary osteoarthritis, left knee: Secondary | ICD-10-CM

## 2016-10-19 DIAGNOSIS — M1711 Unilateral primary osteoarthritis, right knee: Secondary | ICD-10-CM

## 2016-10-19 MED ORDER — LIDOCAINE HCL 2 % IJ SOLN
1.5000 mL | INTRAMUSCULAR | Status: AC | PRN
  Administered 2016-10-19: 1.5 mL

## 2016-10-19 MED ORDER — METHYLPREDNISOLONE ACETATE 40 MG/ML IJ SUSP
80.0000 mg | INTRAMUSCULAR | Status: AC | PRN
  Administered 2016-10-19: 80 mg

## 2016-10-19 MED ORDER — LIDOCAINE HCL 2 % IJ SOLN
2.0000 mL | INTRAMUSCULAR | Status: AC | PRN
  Administered 2016-10-19: 2 mL

## 2016-10-19 MED ORDER — BUPIVACAINE HCL 0.5 % IJ SOLN
3.0000 mL | INTRAMUSCULAR | Status: AC | PRN
  Administered 2016-10-19: 3 mL via INTRA_ARTICULAR

## 2016-10-19 MED ORDER — SODIUM HYALURONATE (VISCOSUP) 20 MG/2ML IX SOSY
20.0000 mg | PREFILLED_SYRINGE | INTRA_ARTICULAR | Status: AC | PRN
  Administered 2016-10-19: 20 mg via INTRA_ARTICULAR

## 2016-10-19 NOTE — Progress Notes (Signed)
Office Visit Note   Patient: [name]           Date of Birth: 03-Apr-1951           MRN: [id number] Visit Date: 10/19/2016              Requested by: Hoyt Koch, MD [place], [place] [id number] PCP: Hoyt Koch, MD   Assessment & Plan: Visit Diagnoses:  1. Unilateral primary osteoarthritis, right knee   2. Unilateral primary osteoarthritis, left knee     Plan:  #1: Euflexa injection to the right knee was performed without difficulty #2: Corticosteroid injection to the left knee was performed without difficulty #3: Return in 1 week for her second Euflexa injection to the right knee and her first Euflex injection to the left knee.  Follow-Up Instructions: No Follow-up on file.   Orders:  Orders Placed This Encounter  Procedures  . Large Joint Injection/Arthrocentesis  . Large Joint Injection/Arthrocentesis   No orders of the defined types were placed in this encounter.     Procedures: Large Joint Inj Date/Time: 10/19/2016 11:27 AM Performed by: Biagio Borg D Authorized by: Biagio Borg D   Consent Given by:  Patient Timeout: prior to procedure the correct patient, procedure, and site was verified   Indications:  Pain and joint swelling Location:  Knee Site:  R knee Prep: patient was prepped and draped in usual sterile fashion   Needle Size:  25 G Needle Length:  1.5 inches Approach:  Anteromedial Ultrasound Guidance: No   Fluoroscopic Guidance: No   Arthrogram: No   Medications:  20 mg Sodium Hyaluronate 20 MG/2ML; 1.5 mL lidocaine 2 % Aspiration Attempted: No   Patient tolerance:  Patient tolerated the procedure well with no immediate complications  Pharmacist was checked with the company and is acceptable to use Euflexa in someone that has reaction to egg.  Large Joint Inj Date/Time: 10/19/2016 11:30 AM Performed by: Biagio Borg D Authorized by: Biagio Borg D   Consent Given by:  Patient Timeout:  prior to procedure the correct patient, procedure, and site was verified   Indications:  Pain and joint swelling Location:  Knee Site:  L knee Prep: patient was prepped and draped in usual sterile fashion   Needle Size:  25 G Needle Length:  1.5 inches Approach:  Anteromedial Ultrasound Guidance: No   Fluoroscopic Guidance: No   Arthrogram: No   Medications:  80 mg methylPREDNISolone acetate 40 MG/ML; 3 mL bupivacaine 0.5 %; 2 mL lidocaine 2 % Aspiration Attempted: No   Patient tolerance:  Patient tolerated the procedure well with no immediate complications     Clinical Data: No additional findings.   Subjective: Chief Complaint  Patient presents with  . Injections    RIGHT KNEE INJ    [name] is a very pleasant 66 year old white female who is seen today for bilateral knee pain. She was last seen on 06/21/2016 at that time a corticosteroid injection to the right knee. The injection did have at least 1-2 weeks of benefit. She was then set up for Euflex injections. She does have some type of allergy to eggs and therefore revision of the pharmacist had called and Euflexa was an appropriate material to use that would not cause any reactivity.  Because she was having pain also in the left knee she has asked whether it will be possible to do the Euflex injections in both knees.  The pain is very bad at  times and relates it hurts to walk up and down stairs as well as getting up from sitting position and walking irritates the right knee. Ibuprofen and tylenol for pain. Pt has tolerated cortisone injections in the past very well with good results.    Review of Systems  Constitutional: Negative.   HENT: Negative.   Respiratory: Negative.   Cardiovascular: Negative.   Gastrointestinal: Negative.   Genitourinary: Negative.   Skin: Negative.   Neurological: Negative.   Hematological: Negative.   Psychiatric/Behavioral: Negative.      Objective: Vital Signs: BP 99/71   Pulse 78    Ht 5' 2"  (1.575 m)   Wt 167 lb (75.8 kg)   LMP 02/09/2005   BMI 30.54 kg/m   Physical Exam  Constitutional: She is oriented to person, place, and time. She appears well-developed and well-nourished.  HENT:  Head: Normocephalic and atraumatic.  Eyes: EOM are normal. Pupils are equal, round, and reactive to light.  Pulmonary/Chest: Effort normal.  Neurological: She is alert and oriented to person, place, and time.  Skin: Skin is warm and dry.  Psychiatric: She has a normal mood and affect. Her behavior is normal. Judgment and thought content normal.    Ortho Exam  Right Knee Exam   Range of Motion  Right knee extension: 3.  Right knee flexion: 95.   Other  Erythema: absent Scars: absent Sensation: normal Pulse: present Swelling: mild  Comments:  Crepitus patellofemoral area with range of motion. Does have severe laxity with valgus stressing opening medially with a good endpoint.   Left Knee Exam   Range of Motion  Left knee extension: 0.  Left knee flexion: 103.   Other  Erythema: absent Scars: absent Sensation: normal Pulse: present Swelling: mild  Comments:  Crepitus patellofemoral area with range of motion. Does have severe laxity with valgus stressing opening medially with a good endpoint.     Specialty Comments:  No specialty comments available.  Imaging: No results found.   PMFS History: Patient Active Problem List   Diagnosis Date Noted  . Nausea without vomiting 09/01/2016  . Chronic diarrhea 12/07/2014  . Segmental colitis with rectal bleeding ([place]) 09/12/2014  . Routine general medical examination at a health care facility 06/05/2014  . Overweight 06/13/2011  . ABNORMAL CHEST XRAY 09/30/2007  . Hyperlipidemia 02/17/2007  . Essential hypertension 01/08/2007  . Osteoarthritis 01/08/2007   Past Medical History:  Diagnosis Date  . Arthritis    back, fingers with joint pain and swelling.  chronic back pain  . Chronic back  pain    arthritis   . Diverticulosis   . Elevated cholesterol    takes Niacin daily and Simvastatin  . GERD (gastroesophageal reflux disease) 06/2004   non-specific gastritis on EGD 06/2004  . Headache(784.0)    occasionally  . History of colon polyps 2004, 2009   2004:adenomatous. 2009 hyperplastic.   Marland Kitchen Hypertension    takes Tenoretic and Lisinopril daily  . Mucoid cyst of joint 08/2013   right index finger  . Osteopenia 01/2014   T score -1.1 FRAX 14%/0.5%. Stable from prior DEXA  . PONV (postoperative nausea and vomiting)   . Seasonal allergies   . Stroke ([place]) 1998   x 2 - mild left-sided weakness  . Urge incontinence   . Uterine prolapse     Family History  Problem Relation Age of Onset  . Hypertension Mother   . Heart disease Mother   . Diabetes Father   . Hypertension Father   .  Heart disease Father   . Stroke Father   . Diabetes Maternal Aunt   . Diabetes Paternal Grandmother   . Hypertension Paternal Grandfather   . Stroke Paternal Grandfather     Past Surgical History:  Procedure Laterality Date  . BACK SURGERY     x 2  . COLONOSCOPY  2004, 2009, 2014  . FINGER SURGERY    . GUM SURGERY    . GYNECOLOGIC CRYOSURGERY    . HYSTEROSCOPY W/D&C  12/07/2010   with resection of endometrial polyp  . KNEE ARTHROSCOPY Left 2008  . lip biopsy     done at MD office Fri 11/22/13  . MASS EXCISION Right 08/15/2013   Procedure: RIGHT INDEX EXCISION MASS ;  Surgeon: Tennis Must, MD;  Location: [facility];  Service: Orthopedics;  Laterality: Right;  . NASAL SEPTUM SURGERY    . OOPHORECTOMY Right 2000  . SHOULDER ARTHROSCOPY WITH OPEN ROTATOR CUFF REPAIR AND DISTAL CLAVICLE ACROMINECTOMY Right 11/26/2013   Procedure: RIGHT SHOULDER ARTHROSCOPY WITH MINI OPEN ROTATOR CUFF REPAIR AND DISTAL CLAVICLE RESECTION, SUBACROMIAL DECOMPRESSION, POSSIBLE [facility] PATCH.;  Surgeon: [name], MD;  Location: [facility];  Service: Orthopedics;  Laterality: Right;  . TUBAL  LIGATION     Social History   Occupational History  . [employer]     Disability/Retired   Social History Main Topics  . Smoking status: Passive Smoke Exposure - Never Smoker  . Smokeless tobacco: Never Used  . Alcohol use 0.0 oz/week     Comment: rarely  . Drug use: No  . Sexual activity: Yes    Partners: Male    Birth control/ protection: Surgical, Post-menopausal     Comment: BTL-1st intercourse 66 yo-More than 5 partners

## 2016-10-20 LAB — GASTROINTESTINAL PATHOGEN PANEL PCR
C. difficile Tox A/B, PCR: NOT DETECTED
CAMPYLOBACTER, PCR: NOT DETECTED
CRYPTOSPORIDIUM, PCR: NOT DETECTED
E COLI (ETEC) LT/ST, PCR: NOT DETECTED
E coli (STEC) stx1/stx2, PCR: NOT DETECTED
E coli 0157, PCR: NOT DETECTED
GIARDIA LAMBLIA, PCR: NOT DETECTED
NOROVIRUS, PCR: NOT DETECTED
ROTAVIRUS, PCR: NOT DETECTED
SHIGELLA, PCR: NOT DETECTED
Salmonella, PCR: NOT DETECTED

## 2016-10-25 LAB — PANCREATIC ELASTASE, FECAL: Pancreatic Elastase-1, Stool: >500 mcg/g

## 2016-10-26 ENCOUNTER — Ambulatory Visit (INDEPENDENT_AMBULATORY_CARE_PROVIDER_SITE_OTHER): Payer: Medicare Other | Admitting: Orthopedic Surgery

## 2016-10-26 ENCOUNTER — Encounter (INDEPENDENT_AMBULATORY_CARE_PROVIDER_SITE_OTHER): Payer: Self-pay | Admitting: Orthopedic Surgery

## 2016-10-26 DIAGNOSIS — M1712 Unilateral primary osteoarthritis, left knee: Secondary | ICD-10-CM

## 2016-10-26 DIAGNOSIS — M1711 Unilateral primary osteoarthritis, right knee: Secondary | ICD-10-CM | POA: Diagnosis not present

## 2016-10-26 MED ORDER — SODIUM HYALURONATE (VISCOSUP) 20 MG/2ML IX SOSY
20.0000 mg | PREFILLED_SYRINGE | INTRA_ARTICULAR | Status: AC | PRN
  Administered 2016-10-26: 20 mg via INTRA_ARTICULAR

## 2016-10-26 MED ORDER — LIDOCAINE HCL 2 % IJ SOLN
2.0000 mL | INTRAMUSCULAR | Status: AC | PRN
  Administered 2016-10-26: 2 mL

## 2016-10-26 NOTE — Progress Notes (Signed)
Office Visit Note   Patient: [name]           Date of Birth: [birth year]           MRN: [id number] Visit Date: 10/26/2016              Requested by: Hoyt Koch, MD [place], [place] [id number] PCP: Hoyt Koch, MD   Assessment & Plan: Visit Diagnoses:  1. Unilateral primary osteoarthritis, left knee   2. Unilateral primary osteoarthritis, right knee     Plan:  #1: Euflexa injection #2 to the right knee was given without difficulty #2: Euflexa injection #1 to the left knee was given without difficulty  Follow-Up Instructions: Return in about 1 week (around 11/02/2016).   Orders:  No orders of the defined types were placed in this encounter.  No orders of the defined types were placed in this encounter.     Procedures: Large Joint Inj Date/Time: 10/26/2016 12:27 PM Performed by: Biagio Borg D Authorized by: Biagio Borg D   Consent Given by:  Patient Timeout: prior to procedure the correct patient, procedure, and site was verified   Indications:  Pain and joint swelling Location:  Knee Site:  L knee Prep: patient was prepped and draped in usual sterile fashion   Needle Size:  25 G Needle Length:  1.5 inches Approach:  Anteromedial Ultrasound Guidance: No   Fluoroscopic Guidance: No   Arthrogram: No   Medications:  20 mg Sodium Hyaluronate 20 MG/2ML; 2 mL lidocaine 2 % Aspiration Attempted: No   Patient tolerance:  Patient tolerated the procedure well with no immediate complications Large Joint Inj Date/Time: 10/26/2016 12:28 PM Performed by: Biagio Borg D Authorized by: Biagio Borg D   Consent Given by:  Patient Timeout: prior to procedure the correct patient, procedure, and site was verified   Indications:  Pain and joint swelling Location:  Knee Site:  R knee Prep: patient was prepped and draped in usual sterile fashion   Needle Size:  25 G Needle Length:  1.5 inches Approach:   Anteromedial Ultrasound Guidance: No   Fluoroscopic Guidance: No   Arthrogram: No   Medications:  20 mg Sodium Hyaluronate 20 MG/2ML; 2 mL lidocaine 2 % Aspiration Attempted: No   Patient tolerance:  Patient tolerated the procedure well with no immediate complications     Clinical Data: No additional findings.   Subjective: No chief complaint on file.   [name] is a very pleasant 66 year old white female who was seen on 10/19/2016 for bilateral knee pain. She she at that time had a corticosteroid injection to the left knee and a Euflexa injection #1 to the right knee. She was then set up for Euflex injections. She does have some type of allergy to eggs and therefore  the pharmacist had called and Euflexa was an appropriate material to use that would not cause any reactivity.  Because she was having pain also in the left knee she has asked whether it will be possible to do the Euflex injections in both knees at that time.  The pain is very bad at times and relates it hurts to walk up and down stairs as well as getting up from sitting position and walking irritates the right knee. Ibuprofen and tylenol for pain. Pt has tolerated cortisone injections in the past very well with good results.     Review of Systems  Constitutional: Negative.   HENT: Negative.   Respiratory: Negative.  Cardiovascular: Negative.   Gastrointestinal: Negative.   Genitourinary: Negative.   Skin: Negative.   Neurological: Negative.   Hematological: Negative.   Psychiatric/Behavioral: Negative.      Objective: Vital Signs: LMP 02/09/2005   Physical Exam  Constitutional: She is oriented to person, place, and time. She appears well-developed and well-nourished.  HENT:  Head: Normocephalic and atraumatic.  Eyes: Pupils are equal, round, and reactive to light. EOM are normal.  Pulmonary/Chest: Effort normal.  Neurological: She is alert and oriented to person, place, and time.  Skin: Skin is warm  and dry.  Psychiatric: She has a normal mood and affect. Her behavior is normal. Judgment and thought content normal.    Ortho Exam  Right Knee Exam   Range of Motion  Right knee extension: 3.  Right knee flexion: 95.   Other  Erythema: absent Scars: absent Sensation: normal Pulse: present Swelling: mild  Comments:  Crepitus patellofemoral area with range of motion. Does have severe laxity with valgus stressing opening medially with a good endpoint.   Left Knee Exam   Range of Motion  Left knee extension: 0.  Left knee flexion: 103.   Other  Erythema: absent Scars: absent Sensation: normal Pulse: present Swelling: mild  Comments:  Crepitus patellofemoral area with range of motion. Does have severe laxity with valgus stressing opening medially with a good endpoint.   Specialty Comments:  No specialty comments available.  Imaging: No results found.   PMFS History: Current Outpatient Prescriptions  Medication Sig Dispense Refill  . aspirin 81 MG tablet Take 1 tablet (81 mg total) by mouth 2 (two) times daily. Resume in 1 week.    Marland Kitchen atenolol-chlorthalidone (TENORETIC) 50-25 MG tablet Take 1 tablet by mouth daily. 90 tablet 1  . b complex vitamins tablet Take 1 tablet by mouth daily.    . Calcium Carbonate-Vitamin D (CALCIUM + D PO) Take 1 tablet by mouth daily.     . cholecalciferol (VITAMIN D) 1000 UNITS tablet Take 1,000 Units by mouth daily.      . Chromium 1 MG CAPS Take 1 mg by mouth daily.     Marland Kitchen co-enzyme Q-10 30 MG capsule Take 30 mg by mouth 2 (two) times daily.     . diphenoxylate-atropine (LOMOTIL) 2.5-0.025 MG tablet TAKE ONE TABLET BY MOUTH 3 TIMES DAILY AS NEEDED FOR  DIARRHEA  OR  LOOSE  STOOLS 90 tablet 0  . folic acid (FOLVITE) 073 MCG tablet Take 400 mcg by mouth daily.    . hyoscyamine (LEVSIN SL) 0.125 MG SL tablet Take one tablet by mouth before meals three times a day 90 tablet 11  . lisinopril (PRINIVIL,ZESTRIL) 2.5 MG tablet TAKE 1  TABLET DAILY 90 tablet 1  . Magnesium 100 MG CAPS Take 100 mg by mouth at bedtime.     Marland Kitchen MELATONIN ER PO Take 1 tablet by mouth at bedtime.     . methocarbamol (ROBAXIN) 500 MG tablet TAKE 1 TABLET EVERY 6 HOURSAS NEEDED FOR MUSCLE SPASMS 90 tablet 0  . Multiple Vitamin (MULTIVITAMIN) tablet Take 1 tablet by mouth daily.      . ondansetron (ZOFRAN) 4 MG tablet Take 1 tablet (4 mg total) by mouth every 8 (eight) hours as needed for nausea or vomiting. 20 tablet 0  . potassium chloride SA (KLOR-CON M20) 20 MEQ tablet Take 1 tablet (20 mEq total) by mouth 2 (two) times daily. 180 tablet 1  . pyridOXINE (VITAMIN B-6) 100 MG tablet Take 100 mg by  mouth daily.    . simvastatin (ZOCOR) 20 MG tablet Take 1 tablet (20 mg total) by mouth daily. 90 tablet 1  . simvastatin (ZOCOR) 20 MG tablet TAKE 1 TABLET DAILY 90 tablet 1  . Zinc Sulfate (ZINC 15 PO) Take 1 tablet by mouth daily.      No current facility-administered medications for this visit.      Patient Active Problem List   Diagnosis Date Noted  . Nausea without vomiting 09/01/2016  . Chronic diarrhea 12/07/2014  . Segmental colitis with rectal bleeding ([place]) 09/12/2014  . Routine general medical examination at a health care facility 06/05/2014  . Overweight 06/13/2011  . ABNORMAL CHEST XRAY 09/30/2007  . Hyperlipidemia 02/17/2007  . Essential hypertension 01/08/2007  . Osteoarthritis 01/08/2007   Past Medical History:  Diagnosis Date  . Arthritis    back, fingers with joint pain and swelling.  chronic back pain  . Chronic back pain    arthritis   . Diverticulosis   . Elevated cholesterol    takes Niacin daily and Simvastatin  . GERD (gastroesophageal reflux disease) 06/2004   non-specific gastritis on EGD 06/2004  . Headache(784.0)    occasionally  . History of colon polyps 2004, 2009   2004:adenomatous. 2009 hyperplastic.   Marland Kitchen Hypertension    takes Tenoretic and Lisinopril daily  . Mucoid cyst of joint 08/2013   right index  finger  . Osteopenia 01/2014   T score -1.1 FRAX 14%/0.5%. Stable from prior DEXA  . PONV (postoperative nausea and vomiting)   . Seasonal allergies   . Stroke ([place]) 1998   x 2 - mild left-sided weakness  . Urge incontinence   . Uterine prolapse     Family History  Problem Relation Age of Onset  . Hypertension Mother   . Heart disease Mother   . Diabetes Father   . Hypertension Father   . Heart disease Father   . Stroke Father   . Diabetes Maternal Aunt   . Diabetes Paternal Grandmother   . Hypertension Paternal Grandfather   . Stroke Paternal Grandfather     Past Surgical History:  Procedure Laterality Date  . BACK SURGERY     x 2  . COLONOSCOPY  2004, 2009, 2014  . FINGER SURGERY    . GUM SURGERY    . GYNECOLOGIC CRYOSURGERY    . HYSTEROSCOPY W/D&C  12/07/2010   with resection of endometrial polyp  . KNEE ARTHROSCOPY Left 2008  . lip biopsy     done at MD office Fri 11/22/13  . MASS EXCISION Right 08/15/2013   Procedure: RIGHT INDEX EXCISION MASS ;  Surgeon: Tennis Must, MD;  Location: [facility];  Service: Orthopedics;  Laterality: Right;  . NASAL SEPTUM SURGERY    . OOPHORECTOMY Right 2000  . SHOULDER ARTHROSCOPY WITH OPEN ROTATOR CUFF REPAIR AND DISTAL CLAVICLE ACROMINECTOMY Right 11/26/2013   Procedure: RIGHT SHOULDER ARTHROSCOPY WITH MINI OPEN ROTATOR CUFF REPAIR AND DISTAL CLAVICLE RESECTION, SUBACROMIAL DECOMPRESSION, POSSIBLE [facility] PATCH.;  Surgeon: [name], MD;  Location: [facility];  Service: Orthopedics;  Laterality: Right;  . TUBAL LIGATION     Social History   Occupational History  . [employer]     Disability/Retired   Social History Main Topics  . Smoking status: Passive Smoke Exposure - Never Smoker  . Smokeless tobacco: Never Used  . Alcohol use 0.0 oz/week     Comment: rarely  . Drug use: No  .  Sexual activity: Yes    Partners: Male    Birth control/ protection: Surgical, Post-menopausal     Comment:  BTL-1st intercourse 66 yo-More than 5 partners

## 2016-11-02 ENCOUNTER — Ambulatory Visit (INDEPENDENT_AMBULATORY_CARE_PROVIDER_SITE_OTHER): Payer: 59 | Admitting: Orthopedic Surgery

## 2016-11-02 ENCOUNTER — Encounter (INDEPENDENT_AMBULATORY_CARE_PROVIDER_SITE_OTHER): Payer: Self-pay | Admitting: Orthopedic Surgery

## 2016-11-02 ENCOUNTER — Ambulatory Visit (INDEPENDENT_AMBULATORY_CARE_PROVIDER_SITE_OTHER): Payer: Medicare Other | Admitting: Orthopedic Surgery

## 2016-11-02 DIAGNOSIS — M1711 Unilateral primary osteoarthritis, right knee: Secondary | ICD-10-CM | POA: Insufficient documentation

## 2016-11-02 DIAGNOSIS — M1712 Unilateral primary osteoarthritis, left knee: Secondary | ICD-10-CM | POA: Diagnosis not present

## 2016-11-02 MED ORDER — SODIUM HYALURONATE (VISCOSUP) 20 MG/2ML IX SOSY
20.0000 mg | PREFILLED_SYRINGE | INTRA_ARTICULAR | Status: AC | PRN
Start: 2016-11-02 — End: 2016-11-02
  Administered 2016-11-02: 20 mg via INTRA_ARTICULAR

## 2016-11-02 MED ORDER — LIDOCAINE HCL 2 % IJ SOLN
2.0000 mL | INTRAMUSCULAR | Status: AC | PRN
  Administered 2016-11-02: 2 mL

## 2016-11-02 MED ORDER — SODIUM HYALURONATE (VISCOSUP) 20 MG/2ML IX SOSY
20.0000 mg | PREFILLED_SYRINGE | INTRA_ARTICULAR | Status: AC | PRN
  Administered 2016-11-02: 20 mg via INTRA_ARTICULAR

## 2016-11-02 NOTE — Progress Notes (Signed)
Office Visit Note   Patient: [name]           Date of Birth: 1950/06/17           MRN: [id number] Visit Date: 11/02/2016              Requested by: Hoyt Koch, MD [place], [place] [id number] PCP: Hoyt Koch, MD   Assessment & Plan: Visit Diagnoses:  1. Unilateral primary osteoarthritis, left knee   2. Unilateral primary osteoarthritis, right knee     Plan:  #1: Euflex injection #3 to the right knee was given. #2: Euflex injection #2 to the left knee was given #3: When she returns next week I would like to get an x-ray of her right shoulder and possible subacromial injection.  Follow-Up Instructions: Return in about 1 week (around 11/09/2016).   Orders:  No orders of the defined types were placed in this encounter.  No orders of the defined types were placed in this encounter.     Procedures: Large Joint Inj Date/Time: 11/02/2016 4:51 PM Performed by: Biagio Borg D Authorized by: Biagio Borg D   Consent Given by:  Patient Timeout: prior to procedure the correct patient, procedure, and site was verified   Indications:  Pain and joint swelling Location:  Knee Site:  R knee Prep: patient was prepped and draped in usual sterile fashion   Needle Size:  25 G Needle Length:  1.5 inches Approach:  Anteromedial Ultrasound Guidance: No   Fluoroscopic Guidance: No   Arthrogram: No   Medications:  20 mg Sodium Hyaluronate 20 MG/2ML; 2 mL lidocaine 2 % Aspiration Attempted: No   Patient tolerance:  Patient tolerated the procedure well with no immediate complications Large Joint Inj Date/Time: 11/02/2016 4:52 PM Performed by: Biagio Borg D Authorized by: Biagio Borg D   Consent Given by:  Patient Timeout: prior to procedure the correct patient, procedure, and site was verified   Indications:  Pain and joint swelling Location:  Knee Site:  L knee Prep: patient was prepped and draped in usual sterile fashion     Needle Size:  25 G Needle Length:  1.5 inches Approach:  Anteromedial Ultrasound Guidance: No   Fluoroscopic Guidance: No   Arthrogram: No   Medications:  20 mg Sodium Hyaluronate 20 MG/2ML; 2 mL lidocaine 2 % Aspiration Attempted: No   Patient tolerance:  Patient tolerated the procedure well with no immediate complications     Clinical Data: No additional findings.   Subjective: Chief Complaint  Patient presents with  . Injections    BILATERAL KNEE EUFLEXXA INJ. #3 - RIGHT  #2 - LEFT    HPI  [name] is seen today for bilateral Euflex injections. Still having some problems with the knees but there is slight improvement. Denies any reactivity.  She is also questioned some problem with her right shoulder which she had surgery previously. Apparently she's having difficulty with abductor in the arm to a certain point and then has to lift it and then continue up from there.  Review of Systems  All other systems reviewed and are negative.    Objective: Vital Signs: LMP 02/09/2005   Physical Exam  Ortho Exam  Exam today of both knees do not reveal any reactivity. Trace effusions at best. No warmth or erythema.  Right shoulder today reveals passively 170 of abduction and forward flexion. With the arm at 90 of abduction and get around 80 internal rotation to about  45. She still has some weakness in abduction With the arm at 90 of abduction.  Specialty Comments:  No specialty comments available.  Imaging: No results found.   PMFS History: Patient Active Problem List   Diagnosis Date Noted  . Unilateral primary osteoarthritis, left knee 11/02/2016  . Unilateral primary osteoarthritis, right knee 11/02/2016  . Nausea without vomiting 09/01/2016  . Chronic diarrhea 12/07/2014  . Segmental colitis with rectal bleeding ([place]) 09/12/2014  . Routine general medical examination at a health care facility 06/05/2014  . Overweight 06/13/2011  . ABNORMAL CHEST XRAY  09/30/2007  . Hyperlipidemia 02/17/2007  . Essential hypertension 01/08/2007  . Osteoarthritis 01/08/2007   Past Medical History:  Diagnosis Date  . Arthritis    back, fingers with joint pain and swelling.  chronic back pain  . Chronic back pain    arthritis   . Diverticulosis   . Elevated cholesterol    takes Niacin daily and Simvastatin  . GERD (gastroesophageal reflux disease) 06/2004   non-specific gastritis on EGD 06/2004  . Headache(784.0)    occasionally  . History of colon polyps 2004, 2009   2004:adenomatous. 2009 hyperplastic.   Marland Kitchen Hypertension    takes Tenoretic and Lisinopril daily  . Mucoid cyst of joint 08/2013   right index finger  . Osteopenia 01/2014   T score -1.1 FRAX 14%/0.5%. Stable from prior DEXA  . PONV (postoperative nausea and vomiting)   . Seasonal allergies   . Stroke ([place]) 1998   x 2 - mild left-sided weakness  . Urge incontinence   . Uterine prolapse     Family History  Problem Relation Age of Onset  . Hypertension Mother   . Heart disease Mother   . Diabetes Father   . Hypertension Father   . Heart disease Father   . Stroke Father   . Diabetes Maternal Aunt   . Diabetes Paternal Grandmother   . Hypertension Paternal Grandfather   . Stroke Paternal Grandfather     Past Surgical History:  Procedure Laterality Date  . BACK SURGERY     x 2  . COLONOSCOPY  2004, 2009, 2014  . FINGER SURGERY    . GUM SURGERY    . GYNECOLOGIC CRYOSURGERY    . HYSTEROSCOPY W/D&C  12/07/2010   with resection of endometrial polyp  . KNEE ARTHROSCOPY Left 2008  . lip biopsy     done at MD office Fri 11/22/13  . MASS EXCISION Right 08/15/2013   Procedure: RIGHT INDEX EXCISION MASS ;  Surgeon: Tennis Must, MD;  Location: [facility];  Service: Orthopedics;  Laterality: Right;  . NASAL SEPTUM SURGERY    . OOPHORECTOMY Right 2000  . SHOULDER ARTHROSCOPY WITH OPEN ROTATOR CUFF REPAIR AND DISTAL CLAVICLE ACROMINECTOMY Right 11/26/2013   Procedure:  RIGHT SHOULDER ARTHROSCOPY WITH MINI OPEN ROTATOR CUFF REPAIR AND DISTAL CLAVICLE RESECTION, SUBACROMIAL DECOMPRESSION, POSSIBLE [facility] PATCH.;  Surgeon: [name], MD;  Location: [facility] [place];  Service: Orthopedics;  Laterality: Right;  . TUBAL LIGATION     Social History   Occupational History  . [employer]     Disability/Retired   Social History Main Topics  . Smoking status: Passive Smoke Exposure - Never Smoker  . Smokeless tobacco: Never Used  . Alcohol use 0.0 oz/week     Comment: rarely  . Drug use: No  . Sexual activity: Yes    Partners: Male    Birth control/ protection: Surgical, Post-menopausal  Comment: BTL-1st intercourse 66 yo-More than 5 partners

## 2016-11-04 ENCOUNTER — Other Ambulatory Visit: Payer: Self-pay | Admitting: Internal Medicine

## 2016-11-09 ENCOUNTER — Encounter (INDEPENDENT_AMBULATORY_CARE_PROVIDER_SITE_OTHER): Payer: Self-pay | Admitting: Orthopedic Surgery

## 2016-11-09 ENCOUNTER — Ambulatory Visit (INDEPENDENT_AMBULATORY_CARE_PROVIDER_SITE_OTHER): Payer: Medicare Other

## 2016-11-09 ENCOUNTER — Ambulatory Visit (INDEPENDENT_AMBULATORY_CARE_PROVIDER_SITE_OTHER): Payer: Self-pay

## 2016-11-09 ENCOUNTER — Ambulatory Visit (INDEPENDENT_AMBULATORY_CARE_PROVIDER_SITE_OTHER): Payer: 59 | Admitting: Orthopedic Surgery

## 2016-11-09 ENCOUNTER — Ambulatory Visit (INDEPENDENT_AMBULATORY_CARE_PROVIDER_SITE_OTHER): Payer: Medicare Other | Admitting: Orthopedic Surgery

## 2016-11-09 DIAGNOSIS — M25511 Pain in right shoulder: Secondary | ICD-10-CM | POA: Diagnosis not present

## 2016-11-09 DIAGNOSIS — R918 Other nonspecific abnormal finding of lung field: Secondary | ICD-10-CM | POA: Diagnosis not present

## 2016-11-09 DIAGNOSIS — G8929 Other chronic pain: Secondary | ICD-10-CM

## 2016-11-09 DIAGNOSIS — M1712 Unilateral primary osteoarthritis, left knee: Secondary | ICD-10-CM

## 2016-11-09 MED ORDER — SODIUM HYALURONATE (VISCOSUP) 20 MG/2ML IX SOSY
20.0000 mg | PREFILLED_SYRINGE | INTRA_ARTICULAR | Status: AC | PRN
  Administered 2016-11-09: 20 mg via INTRA_ARTICULAR

## 2016-11-09 MED ORDER — LIDOCAINE HCL 1 % IJ SOLN
3.0000 mL | INTRAMUSCULAR | Status: AC | PRN
  Administered 2016-11-09: 3 mL

## 2016-11-09 NOTE — Progress Notes (Signed)
Office Visit Note   Patient: [name]           Date of Birth: 08-30-1950           MRN: [id number] Visit Date: 11/09/2016              Requested by: Hoyt Koch, MD [place], [place] [id number] PCP: Hoyt Koch, MD   Assessment & Plan: Visit Diagnoses:  1. Chronic right shoulder pain   2. Abnormality of lung on chest x-ray   3. Unilateral primary osteoarthritis, left knee     Plan: #1: MRI of right shoulder evaluation of rotator cuff #2: Chest x-ray for evaluation of possible lesion fifth rib on right    Follow-Up Instructions: Return after MRI is complete   Orders:  Orders Placed This Encounter  Procedures  . Large Joint Injection/Arthrocentesis  . XR Shoulder Right  . MR SHOULDER RIGHT WO CONTRAST  . DG Chest 2 View   No orders of the defined types were placed in this encounter.     Procedures: Large Joint Inj Date/Time: 11/09/2016 12:23 PM Performed by: Biagio Borg D Authorized by: Biagio Borg D   Consent Given by:  Patient Timeout: prior to procedure the correct patient, procedure, and site was verified   Indications:  Pain and joint swelling Location:  Knee Site:  L knee Prep: patient was prepped and draped in usual sterile fashion   Needle Size:  25 G Needle Length:  1.5 inches Approach:  Anteromedial Ultrasound Guidance: No   Fluoroscopic Guidance: No   Arthrogram: No   Medications:  20 mg Sodium Hyaluronate 20 MG/2ML; 3 mL lidocaine 1 % Aspiration Attempted: No   Patient tolerance:  Patient tolerated the procedure well with no immediate complications      Clinical Data: No additional findings.   Subjective: Chief Complaint  Patient presents with  . Right Shoulder - Pain  . Left Knee - Pain    HPI  [name] is seen today for evaluation of her right shoulder. She came in today for her third Euflexa injection of the left knee. She states that actually is doing very well. She is happy with  the results so far.  However at her last visit she brought up about having problems with the right shoulder. She did have a repair of a massive rotator cuff tear on the right shoulder. Apparently according to the operative note it was brought back to its anatomic position. She also had a biceps tendon tear proximally. Nothing was performed for that. Did also have a subacromial decompression with distal clavicle excision at the time of repair. She is seen today for evaluation.  Review of Systems  All other systems reviewed and are negative.    Objective: Vital Signs: LMP 02/09/2005   Physical Exam  Constitutional: She is oriented to person, place, and time. She appears well-developed and well-nourished.  HENT:  Head: Normocephalic and atraumatic.  Eyes: Pupils are equal, round, and reactive to light. EOM are normal.  Pulmonary/Chest: Effort normal.  Neurological: She is alert and oriented to person, place, and time.  Skin: Skin is warm and dry.  Psychiatric: She has a normal mood and affect. Her behavior is normal. Judgment and thought content normal.    Ortho Exam  Exam today reveals passive range of motion 250 of abduction and has 70 of forward flexion. External rotation with the arm at 90 of abduction is 90. Rotation about 45. Does have  some pain with empty can testing. She is weak in abduction as well as resistance against external rotation. Very strong with internal rotation.  Left knee reveals no signs of inflammation. No warmth or erythema.  Specialty Comments:  No specialty comments available.  Imaging: Xr Shoulder Right  Result Date: 11/09/2016 4 view x-ray of the right shoulder reveals elevation of the humeral head. With marked joint space narrowing in the superior glenohumeral joint. She does have some paracervical cystic Changes in the humeral head and neck. Type I acromion. Good distal clavicle excision noted radiographically. On the right side at fifth rib appears  that she may have had a previous fracture from her fall with callus formation but I am concerned that this may be a possibility of other pathology. We'll send for chest x-ray at [facility] imaging to confirm    PMFS History: Patient Active Problem List   Diagnosis Date Noted  . Unilateral primary osteoarthritis, left knee 11/02/2016  . Unilateral primary osteoarthritis, right knee 11/02/2016  . Nausea without vomiting 09/01/2016  . Chronic diarrhea 12/07/2014  . Segmental colitis with rectal bleeding ([place]) 09/12/2014  . Routine general medical examination at a health care facility 06/05/2014  . Overweight 06/13/2011  . ABNORMAL CHEST XRAY 09/30/2007  . Hyperlipidemia 02/17/2007  . Essential hypertension 01/08/2007  . Osteoarthritis 01/08/2007   Past Medical History:  Diagnosis Date  . Arthritis    back, fingers with joint pain and swelling.  chronic back pain  . Chronic back pain    arthritis   . Diverticulosis   . Elevated cholesterol    takes Niacin daily and Simvastatin  . GERD (gastroesophageal reflux disease) 06/2004   non-specific gastritis on EGD 06/2004  . Headache(784.0)    occasionally  . History of colon polyps 2004, 2009   2004:adenomatous. 2009 hyperplastic.   Marland Kitchen Hypertension    takes Tenoretic and Lisinopril daily  . Mucoid cyst of joint 08/2013   right index finger  . Osteopenia 01/2014   T score -1.1 FRAX 14%/0.5%. Stable from prior DEXA  . PONV (postoperative nausea and vomiting)   . Seasonal allergies   . Stroke ([place]) 1998   x 2 - mild left-sided weakness  . Urge incontinence   . Uterine prolapse     Family History  Problem Relation Age of Onset  . Hypertension Mother   . Heart disease Mother   . Diabetes Father   . Hypertension Father   . Heart disease Father   . Stroke Father   . Diabetes Maternal Aunt   . Diabetes Paternal Grandmother   . Hypertension Paternal Grandfather   . Stroke Paternal Grandfather     Past Surgical History:    Procedure Laterality Date  . BACK SURGERY     x 2  . COLONOSCOPY  2004, 2009, 2014  . FINGER SURGERY    . GUM SURGERY    . GYNECOLOGIC CRYOSURGERY    . HYSTEROSCOPY W/D&C  12/07/2010   with resection of endometrial polyp  . KNEE ARTHROSCOPY Left 2008  . lip biopsy     done at MD office Fri 11/22/13  . MASS EXCISION Right 08/15/2013   Procedure: RIGHT INDEX EXCISION MASS ;  Surgeon: Tennis Must, MD;  Location: [facility];  Service: Orthopedics;  Laterality: Right;  . NASAL SEPTUM SURGERY    . OOPHORECTOMY Right 2000  . SHOULDER ARTHROSCOPY WITH OPEN ROTATOR CUFF REPAIR AND DISTAL CLAVICLE ACROMINECTOMY Right 11/26/2013   Procedure: RIGHT SHOULDER ARTHROSCOPY  WITH MINI OPEN ROTATOR CUFF REPAIR AND DISTAL CLAVICLE RESECTION, SUBACROMIAL DECOMPRESSION, POSSIBLE [facility] PATCH.;  Surgeon: [name], MD;  Location: [place];  Service: Orthopedics;  Laterality: Right;  . TUBAL LIGATION     Social History   Occupational History  . [employer]     Disability/Retired   Social History Main Topics  . Smoking status: Passive Smoke Exposure - Never Smoker  . Smokeless tobacco: Never Used  . Alcohol use 0.0 oz/week     Comment: rarely  . Drug use: No  . Sexual activity: Yes    Partners: Male    Birth control/ protection: Surgical, Post-menopausal     Comment: BTL-1st intercourse 66 yo-More than 5 partners

## 2016-11-16 ENCOUNTER — Ambulatory Visit (HOSPITAL_COMMUNITY)
Admission: RE | Admit: 2016-11-16 | Discharge: 2016-11-16 | Disposition: A | Discharge status: Home / Self Care | Payer: Medicare Other | Source: Ambulatory Visit | Attending: Orthopedic Surgery | Admitting: Orthopedic Surgery

## 2016-11-16 DIAGNOSIS — R918 Other nonspecific abnormal finding of lung field: Secondary | ICD-10-CM | POA: Diagnosis not present

## 2016-11-16 DIAGNOSIS — I700 Atherosclerosis of aorta: Secondary | ICD-10-CM | POA: Insufficient documentation

## 2016-11-22 ENCOUNTER — Encounter: Payer: Self-pay | Admitting: Gynecology

## 2016-11-22 ENCOUNTER — Encounter: Payer: 59 | Admitting: Gynecology

## 2016-11-22 ENCOUNTER — Ambulatory Visit (INDEPENDENT_AMBULATORY_CARE_PROVIDER_SITE_OTHER): Payer: Medicare Other | Admitting: Gynecology

## 2016-11-22 VITALS — BP 116/74 | Ht 62.0 in | Wt 171.0 lb

## 2016-11-22 DIAGNOSIS — N952 Postmenopausal atrophic vaginitis: Secondary | ICD-10-CM | POA: Diagnosis not present

## 2016-11-22 DIAGNOSIS — Z9189 Other specified personal risk factors, not elsewhere classified: Secondary | ICD-10-CM | POA: Diagnosis not present

## 2016-11-22 DIAGNOSIS — Z01411 Encounter for gynecological examination (general) (routine) with abnormal findings: Secondary | ICD-10-CM

## 2016-11-22 DIAGNOSIS — N8111 Cystocele, midline: Secondary | ICD-10-CM

## 2016-11-22 DIAGNOSIS — M8580 Other specified disorders of bone density and structure, unspecified site: Secondary | ICD-10-CM | POA: Diagnosis not present

## 2016-11-22 DIAGNOSIS — N3281 Overactive bladder: Secondary | ICD-10-CM | POA: Diagnosis not present

## 2016-11-22 NOTE — Patient Instructions (Signed)
Call to Schedule your mammogram  Facilities in [place]: 1)  [facility]. [facility], [address] Phone: [phone] 2)  Dr. Bertrand at [facility]  [address] Phone: [phone]     Mammogram A mammogram is an X-ray test to find changes in a woman's breast. You should get a mammogram if:  You are 66 years of age or older  You have risk factors.   Your doctor recommends that you have one.  BEFORE THE TEST  Do not schedule the test the week before your period, especially if your breasts are sore during this time.  On the day of your mammogram:  Wash your breasts and armpits well. After washing, do not put on any deodorant or talcum powder on until after your test.   Eat and drink as you usually do.   Take your medicines as usual.   If you are diabetic and take insulin, make sure you:   Eat before coming for your test.   Take your insulin as usual.   If you cannot keep your appointment, call before the appointment to cancel. Schedule another appointment.  TEST  You will need to undress from the waist up. You will put on a hospital gown.   Your breast will be put on the mammogram machine, and it will press firmly on your breast with a piece of plastic called a compression paddle. This will make your breast flatter so that the machine can X-ray all parts of your breast.   Both breasts will be X-rayed. Each breast will be X-rayed from above and from the side. An X-ray might need to be taken again if the picture is not good enough.   The mammogram will last about 15 to 30 minutes.  AFTER THE TEST Finding out the results of your test Ask when your test results will be ready. Make sure you get your test results.  Document Released: 06/24/2008 Document Revised: 03/17/2011 Document Reviewed: 06/24/2008 ExitCare Patient Information 2012 [employer].   

## 2016-11-22 NOTE — Progress Notes (Signed)
    [name] March 05, 1951 [id number]        66 y.o.  [id number] for breast and pelvic exam. Patient does note having issues with urinary urgency. Knows that whenever she feels like she has to go to the bathroom she is unable to make it sometimes and has leakage issues. Is not having issues as far as frequency, dysuria, low back pain, fever or chills. No stress urinary incontinence symptoms such as loss of urine with coughing, sneezing, laughing or exercising. Has been going on for some time. Does have a history of cystocele.  Past medical history,surgical history, problem list, medications, allergies, family history and social history were all reviewed and documented as reviewed in the EPIC chart.  ROS:  Performed with pertinent positives and negatives included in the history, assessment and plan.   Additional significant findings :  None   Exam: Caryn Bee assistant Vitals:   11/22/16 1457  BP: 116/74  Weight: 171 lb (77.6 kg)  Height: 5' 2"  (1.575 m)   Body mass index is 31.28 kg/m.  General appearance:  Normal affect, orientation and appearance. Skin: Grossly normal HEENT: Without gross lesions.  No cervical or supraclavicular adenopathy. Thyroid normal.  Lungs:  Clear without wheezing, rales or rhonchi Cardiac: RR, without RMG Abdominal:  Soft, nontender, without masses, guarding, rebound, organomegaly or hernia Breasts:  Examined lying and sitting without masses, retractions, discharge or axillary adenopathy. Pelvic:  Ext, BUS, Vagina: With atrophic changes. First to second-degree cystocele. Mild uterine prolapse. No significant rectocele  Cervix: With atrophic changes  Uterus: Anteverted, normal size, shape and contour, midline and mobile nontender   Adnexa: Without masses or tenderness    Anus and perineum: Normal   Rectovaginal: Normal sphincter tone without palpated masses or tenderness.    Assessment/Plan:  67 y.o. [id number] female for breast and pelvic  exam.  1. Urinary urgency/cystocele.  Having classic overactive bladder symptoms without stress component. Present for long time. Does not seem to be getting significantly worse. Also with first to second degree cystocele which appears stable on serial exams. Options for OAB management to include behavior modifications both from a diet and bladder training standpoint as well as medications. I reviewed medications with her to include anticholinergic type such as Vesicare/Enablex as well as Myrbitriq.  Side effects and risks of the medications were reviewed. At this point patient is not interested in starting medication. She will try some of the behavior modification techniques and will follow up if she decides to pursue trial of medication. Check urinalysis today. 2. Pap smear/HPV 2015. No Pap smear done today. History of cryosurgery a number of years ago with normal Pap smears since. Plan repeat Pap smear at 5 year interval per current screening guidelines. 3. Colonoscopy 2014 with planned repeat next year and patients going to make arrangements for this. 4. Mammography 2016. Patient knows she is overdue and agrees to call and schedule. Breast exam is normal today. 5. Osteopenia. DEXA 2015 T score -1.1 FRAX 14%/0.5%. Stable from her prior DEXA. Options to repeat now versus waiting another year or so discussed and patient prefers to wait. 6. Health maintenance. No routine lab work done as patient does this elsewhere. Follow up in one year, sooner as needed.  Additional time in excess of her breast and pelvic exam was spent in direct face to face counseling and coordination of care in regards to her overactive bladder management.    Anastasio Auerbach MD, 3:21 PM 11/22/2016   \

## 2016-11-23 ENCOUNTER — Ambulatory Visit
Admission: RE | Admit: 2016-11-23 | Discharge: 2016-11-23 | Disposition: A | Discharge status: Home / Self Care | Payer: Medicare Other | Source: Ambulatory Visit | Attending: Orthopedic Surgery | Admitting: Orthopedic Surgery

## 2016-11-23 DIAGNOSIS — M19011 Primary osteoarthritis, right shoulder: Secondary | ICD-10-CM | POA: Diagnosis not present

## 2016-11-23 LAB — URINALYSIS W MICROSCOPIC + REFLEX CULTURE
Bilirubin Urine: NEGATIVE
Casts: NONE SEEN [LPF]
Glucose, UA: NEGATIVE
HGB URINE DIPSTICK: NEGATIVE
Ketones, ur: NEGATIVE
LEUKOCYTES UA: NEGATIVE
Nitrite: NEGATIVE
PH: 6.5 (ref 5.0–8.0)
Protein, ur: NEGATIVE
RBC / HPF: NONE SEEN RBC/HPF (ref <=2)
Specific Gravity, Urine: 1.018 (ref 1.001–1.035)
WBC, UA: NONE SEEN WBC/HPF (ref <=5)
Yeast: NONE SEEN [HPF]

## 2016-11-24 ENCOUNTER — Ambulatory Visit (INDEPENDENT_AMBULATORY_CARE_PROVIDER_SITE_OTHER): Payer: Medicare Other | Admitting: Orthopaedic Surgery

## 2016-11-24 DIAGNOSIS — M25511 Pain in right shoulder: Secondary | ICD-10-CM

## 2016-11-24 DIAGNOSIS — G8929 Other chronic pain: Secondary | ICD-10-CM | POA: Diagnosis not present

## 2016-11-24 LAB — URINE CULTURE

## 2016-11-24 NOTE — Progress Notes (Signed)
Office Visit Note   Patient: [name]           Date of Birth: 03-27-1951           MRN: [id number] Visit Date: 11/24/2016              Requested by: Hoyt Koch, MD [place],  [id number] PCP: Hoyt Koch, MD   Assessment & Plan: Visit Diagnoses:  1. Chronic right shoulder pain   MRI scan was performed of the right shoulder demonstrating complete tears of the infra and supraspinatus tendons with retraction. There is significant atrophy. There is also advanced degenerative change of the glenohumeral joint with a high riding humeral head abutting the acromion. This is all consistent with rotator cuff arthropathy Plan: long discussion regarding diagnosis and findings of the MRI scan. We discussed exercises, NSAIDs, Tylenol, ice or heat, cortisone injections. Definitive treatment would be a reverse shoulder arthroplasty. For the moment she'll try the exercises and over-the-counter medicines plan to see her back in apparent basis. She has a good understanding of the pathology and what she can expect over time.  Follow-Up Instructions: Return if symptoms worsen or fail to improve.   Orders:  No orders of the defined types were placed in this encounter.  No orders of the defined types were placed in this encounter.     Procedures: No procedures performed   Clinical Data: No additional findings.   Subjective: No chief complaint on file. [name] is 3 years status post rotator cuff tear repair of her right shoulder. She's gradually experience increasing pain to the point of compromise. She had recent x-rays that demonstrated significant superior migration of the humeral head consistent with chronic rotator cuff tear. I'll obtain an MRI scan which HCC here for the results. She is able to raise her arm overhead with some help. She does have some pain that seems to be alleviated with over-the-counter medicines. She denies shortness of  breath chest pain numbness or tingling in the left right upper extremity. She is not experiencing any abdominal problems.   HPI  Review of Systems   Objective: Vital Signs: LMP 02/09/2005   Physical Exam  Ortho Exam able to place her right arm over her head passively and once it's over her head she can maintain apposition. No grinding or grating. Negative impingement. Significant weakness with external rotation not so much with internal rotation. Skin intact no swelling about the right shoulder. No grinding or grating or crepitation. She is able to abduct about 80 but does have weakness. Pain along the acromioclavicular joint or the anterior aspect of the shoulder. Awake alert and oriented 3   Specialty Comments:  No specialty comments available.  Imaging: No results found.   PMFS History: Patient Active Problem List   Diagnosis Date Noted  . Unilateral primary osteoarthritis, left knee 11/02/2016  . Unilateral primary osteoarthritis, right knee 11/02/2016  . Nausea without vomiting 09/01/2016  . Chronic diarrhea 12/07/2014  . Segmental colitis with rectal bleeding ([place]) 09/12/2014  . Routine general medical examination at a health care facility 06/05/2014  . Overweight 06/13/2011  . ABNORMAL CHEST XRAY 09/30/2007  . Hyperlipidemia 02/17/2007  . Essential hypertension 01/08/2007  . Osteoarthritis 01/08/2007   Past Medical History:  Diagnosis Date  . Arthritis    back, fingers with joint pain and swelling.  chronic back pain  . Chronic back pain    arthritis   . Diverticulosis   .  Elevated cholesterol    takes Niacin daily and Simvastatin  . GERD (gastroesophageal reflux disease) 06/2004   non-specific gastritis on EGD 06/2004  . Headache(784.0)    occasionally  . History of colon polyps 2004, 2009   2004:adenomatous. 2009 hyperplastic.   Marland Kitchen Hypertension    takes Tenoretic and Lisinopril daily  . Mucoid cyst of joint 08/2013   right index finger  . Osteopenia  01/2014   T score -1.1 FRAX 14%/0.5%. Stable from prior DEXA  . PONV (postoperative nausea and vomiting)   . Seasonal allergies   . Stroke ([place]) 1998   x 2 - mild left-sided weakness  . Urge incontinence   . Uterine prolapse     Family History  Problem Relation Age of Onset  . Hypertension Mother   . Heart disease Mother   . Diabetes Father   . Hypertension Father   . Heart disease Father   . Stroke Father   . Diabetes Maternal Aunt   . Diabetes Paternal Grandmother   . Hypertension Paternal Grandfather   . Stroke Paternal Grandfather     Past Surgical History:  Procedure Laterality Date  . BACK SURGERY     x 2  . COLONOSCOPY  2004, 2009, 2014  . FINGER SURGERY    . GUM SURGERY    . GYNECOLOGIC CRYOSURGERY    . HYSTEROSCOPY W/D&C  12/07/2010   with resection of endometrial polyp  . KNEE ARTHROSCOPY Left 2008  . lip biopsy     done at MD office Fri 11/22/13  . MASS EXCISION Right 08/15/2013   Procedure: RIGHT INDEX EXCISION MASS ;  Surgeon: Tennis Must, MD;  Location: [facility];  Service: Orthopedics;  Laterality: Right;  . NASAL SEPTUM SURGERY    . OOPHORECTOMY Right 2000  . SHOULDER ARTHROSCOPY WITH OPEN ROTATOR CUFF REPAIR AND DISTAL CLAVICLE ACROMINECTOMY Right 11/26/2013   Procedure: RIGHT SHOULDER ARTHROSCOPY WITH MINI OPEN ROTATOR CUFF REPAIR AND DISTAL CLAVICLE RESECTION, SUBACROMIAL DECOMPRESSION, POSSIBLE [facility] PATCH.;  Surgeon: [name], MD;  Location: [facility];  Service: Orthopedics;  Laterality: Right;  . TUBAL LIGATION     Social History   Occupational History  . [employer]     Disability/Retired   Social History Main Topics  . Smoking status: Passive Smoke Exposure - Never Smoker  . Smokeless tobacco: Never Used  . Alcohol use 0.0 oz/week     Comment: rarely  . Drug use: No  . Sexual activity: Yes    Partners: Male    Birth control/ protection: Surgical, Post-menopausal     Comment: BTL-1st intercourse  66 yo-More than 5 partners     [name], MD   Note - This record has been created using [employer].  Chart creation errors have been sought, but may not always  have been located. Such creation errors do not reflect on  the standard of medical care.

## 2016-11-28 ENCOUNTER — Ambulatory Visit (INDEPENDENT_AMBULATORY_CARE_PROVIDER_SITE_OTHER): Payer: Medicare Other | Admitting: Gastroenterology

## 2016-11-28 ENCOUNTER — Encounter: Payer: Self-pay | Admitting: Gastroenterology

## 2016-11-28 VITALS — BP 100/64 | HR 72 | Ht 62.0 in | Wt 174.2 lb

## 2016-11-28 DIAGNOSIS — R197 Diarrhea, unspecified: Secondary | ICD-10-CM

## 2016-11-28 DIAGNOSIS — K219 Gastro-esophageal reflux disease without esophagitis: Secondary | ICD-10-CM | POA: Diagnosis not present

## 2016-11-28 NOTE — Progress Notes (Signed)
    History of Present Illness: This is a 66 year old female returning for follow up of chronic diarrhea. GI pathogen panel, fecal elastase, tTG all negative in July. Low FODMAP diet has eliminated her diarrhea. She notes mild constipation and occasional flares of reflux mainly at night. She does not want to resume a PPI if possible..   Current Medications, Allergies, Past Medical History, Past Surgical History, Family History and Social History were reviewed in [employer] record.  Physical Exam: General: Well developed, well nourished, no acute distress Head: Normocephalic and atraumatic Eyes:  sclerae anicteric, EOMI Ears: Normal auditory acuity Mouth: No deformity or lesions Lungs: Clear throughout to auscultation Heart: Regular rate and rhythm; no murmurs, rubs or bruits Abdomen: Soft, non tender and non distended. No masses, hepatosplenomegaly or hernias noted. Normal Bowel sounds Musculoskeletal: Symmetrical with no gross deformities  Pulses:  Normal pulses noted Extremities: No clubbing, cyanosis, edema or deformities noted Neurological: Alert oriented x 4, grossly nonfocal Psychological:  Alert and cooperative. Normal mood and affect  Assessment and Recommendations:   1. Diarrhea, resolved on low FODMAP diet. Reintroduce one food at a time from [place]. Change hyoscyamine and use when necessary for abdominal pain and cramping. MiraLAX once or twice daily as needed.  2. GERD with intermittent breakthrough symptoms. She does not want to resume a PPI. Begin ranitidine 150 mg twice daily as needed. She is to contact her office if symptoms are not adequately controlled.  3. Mildly elevated transaminases. Repeat LFTs in 2 months.

## 2016-11-28 NOTE — Patient Instructions (Signed)
Discontinue Levsin and use as needed for abdominal pain.   Take over the counter Pepcid AC or Zantac 150 mg twice daily for reflux symptoms.   Call our office back if you see no improvement in your symptoms.   Normal BMI (Body Mass Index- based on height and weight) is between 23 and 30. Your BMI today is Body mass index is 31.87 kg/m. Marland Kitchen Please consider follow up  regarding your BMI with your Primary Care Provider.   Thank you for choosing me and [facility].  Pricilla Riffle. Dagoberto Ligas., MD., Marval Regal

## 2016-12-09 ENCOUNTER — Encounter: Payer: No Typology Code available for payment source | Admitting: Internal Medicine

## 2016-12-19 DIAGNOSIS — Z1231 Encounter for screening mammogram for malignant neoplasm of breast: Secondary | ICD-10-CM | POA: Diagnosis not present

## 2016-12-19 LAB — HM MAMMOGRAPHY

## 2016-12-21 ENCOUNTER — Encounter: Payer: Self-pay | Admitting: Internal Medicine

## 2016-12-21 NOTE — Progress Notes (Signed)
Abstracted and sent to scan  

## 2017-01-02 ENCOUNTER — Encounter: Payer: Self-pay | Admitting: Internal Medicine

## 2017-01-02 ENCOUNTER — Other Ambulatory Visit (INDEPENDENT_AMBULATORY_CARE_PROVIDER_SITE_OTHER): Payer: Medicare Other

## 2017-01-02 ENCOUNTER — Ambulatory Visit (INDEPENDENT_AMBULATORY_CARE_PROVIDER_SITE_OTHER): Payer: Medicare Other | Admitting: Internal Medicine

## 2017-01-02 VITALS — BP 110/70 | HR 66 | Temp 97.5°F | Ht 62.0 in | Wt 174.0 lb

## 2017-01-02 DIAGNOSIS — Z23 Encounter for immunization: Secondary | ICD-10-CM | POA: Diagnosis not present

## 2017-01-02 DIAGNOSIS — R7301 Impaired fasting glucose: Secondary | ICD-10-CM | POA: Diagnosis not present

## 2017-01-02 DIAGNOSIS — K529 Noninfective gastroenteritis and colitis, unspecified: Secondary | ICD-10-CM | POA: Diagnosis not present

## 2017-01-02 DIAGNOSIS — E785 Hyperlipidemia, unspecified: Secondary | ICD-10-CM | POA: Diagnosis not present

## 2017-01-02 DIAGNOSIS — Z0000 Encounter for general adult medical examination without abnormal findings: Secondary | ICD-10-CM | POA: Diagnosis not present

## 2017-01-02 DIAGNOSIS — I10 Essential (primary) hypertension: Secondary | ICD-10-CM | POA: Diagnosis not present

## 2017-01-02 LAB — COMPREHENSIVE METABOLIC PANEL
ALBUMIN: 4.2 g/dL (ref 3.5–5.2)
ALT: 24 U/L (ref 0–35)
AST: 32 U/L (ref 0–37)
Alkaline Phosphatase: 46 U/L (ref 39–117)
BILIRUBIN TOTAL: 0.8 mg/dL (ref 0.2–1.2)
BUN: 14 mg/dL (ref 6–23)
CALCIUM: 9.8 mg/dL (ref 8.4–10.5)
CHLORIDE: 105 meq/L (ref 96–112)
CO2: 31 meq/L (ref 19–32)
Creatinine, Ser: 0.92 mg/dL (ref 0.40–1.20)
GFR: 64.81 mL/min (ref >60.00)
Glucose, Bld: 95 mg/dL (ref 70–99)
Potassium: 4.1 mEq/L (ref 3.5–5.1)
Sodium: 144 mEq/L (ref 135–145)
Total Protein: 7.1 g/dL (ref 6.0–8.3)

## 2017-01-02 LAB — CBC
HCT: 43.4 % (ref 36.0–46.0)
HEMOGLOBIN: 14.5 g/dL (ref 12.0–15.0)
MCHC: 33.3 g/dL (ref 30.0–36.0)
MCV: 91.4 fl (ref 78.0–100.0)
PLATELETS: 211 10*3/uL (ref 150.0–400.0)
RBC: 4.75 Mil/uL (ref 3.87–5.11)
RDW: 13.8 % (ref 11.5–15.5)
WBC: 8.1 10*3/uL (ref 4.0–10.5)

## 2017-01-02 LAB — LIPID PANEL
CHOL/HDL RATIO: 4
CHOLESTEROL: 165 mg/dL (ref 0–200)
HDL: 41.1 mg/dL (ref >39.00)
LDL CALC: 95 mg/dL (ref 0–99)
NonHDL: 123.69
TRIGLYCERIDES: 141 mg/dL (ref 0.0–149.0)
VLDL: 28.2 mg/dL (ref 0.0–40.0)

## 2017-01-02 LAB — HEMOGLOBIN A1C: Hgb A1c MFr Bld: 5.7 % (ref 4.6–6.5)

## 2017-01-02 NOTE — Progress Notes (Signed)
   Subjective:    Patient ID: [name], female    DOB: April 01, 1951, 66 y.o.   MRN: [id number]  HPI Here for medicare wellness, no new complaints. Please see A/P for status and treatment of chronic medical problems.   HPI #2: Here for follow up of her hypertension (taking atenolol, chlorthalidone and lisinopril, BP at goal, denies headaches or chest pains or SOB), and her cholesterol (taking simvastatin, no muscle aches, no chest pains), and her chronic diarrhea (uses lomotil sometimes and hyoscamine, denies side effects with them, taking them as needed, GI colonoscopy up to date and no cause was found for this).   Diet: heart healthy Physical activity: sedentary Depression/mood screen: negative Hearing: intact to whispered voice Visual acuity: grossly normal, performs annual eye exam  ADLs: capable Fall risk: none Home safety: good Cognitive evaluation: intact to orientation, naming, recall and repetition EOL planning: adv directives discussed  I have personally reviewed and have noted 1. The patient's medical and social history - reviewed today no changes 2. Their use of alcohol, tobacco or illicit drugs 3. Their current medications and supplements 4. The patient's functional ability including ADL's, fall risks, home safety risks and hearing or visual impairment. 5. Diet and physical activities 6. Evidence for depression or mood disorders 7. Care team reviewed and updated (available in snapshot)  Review of Systems  Constitutional: Negative.   HENT: Negative.   Eyes: Negative.   Respiratory: Negative for cough, chest tightness and shortness of breath.   Cardiovascular: Negative for chest pain, palpitations and leg swelling.  Gastrointestinal: Positive for diarrhea. Negative for abdominal distention, abdominal pain, constipation, nausea and vomiting.  Musculoskeletal: Negative.   Skin: Negative.   Neurological: Negative.   Psychiatric/Behavioral: Negative.         Objective:   Physical Exam  Constitutional: She is oriented to person, place, and time. She appears well-developed and well-nourished.  HENT:  Head: Normocephalic and atraumatic.  Eyes: EOM are normal.  Neck: Normal range of motion.  Cardiovascular: Normal rate and regular rhythm.   Pulmonary/Chest: Effort normal and breath sounds normal. No respiratory distress. She has no wheezes. She has no rales.  Abdominal: Soft. Bowel sounds are normal. She exhibits no distension. There is no tenderness. There is no rebound.  Musculoskeletal: She exhibits no edema.  Neurological: She is alert and oriented to person, place, and time. Coordination normal.  Skin: Skin is warm and dry.  Psychiatric: She has a normal mood and affect.   Vitals:   01/02/17 1302  BP: 110/70  Pulse: 66  Temp: (!) 97.5 F (36.4 C)  TempSrc: Oral  SpO2: 98%  Weight: 174 lb (78.9 kg)  Height: 5' 2"  (1.575 m)      Assessment & Plan:  Pneumonia 23 given at visit.

## 2017-01-02 NOTE — Patient Instructions (Addendum)
We will check the labs today and call you back with the results.   We have given you the pneumonia shot today which is the last one.   Health Maintenance, Female Adopting a healthy lifestyle and getting preventive care can go a long way to promote health and wellness. Talk with your health care provider about what schedule of regular examinations is right for you. This is a good chance for you to check in with your provider about disease prevention and staying healthy. In between checkups, there are plenty of things you can do on your own. Experts have done a lot of research about which lifestyle changes and preventive measures are most likely to keep you healthy. Ask your health care provider for more information. Weight and diet Eat a healthy diet  Be sure to include plenty of vegetables, fruits, low-fat dairy products, and lean protein.  Do not eat a lot of foods high in solid fats, added sugars, or salt.  Get regular exercise. This is one of the most important things you can do for your health. ? Most adults should exercise for at least 150 minutes each week. The exercise should increase your heart rate and make you sweat (moderate-intensity exercise). ? Most adults should also do strengthening exercises at least twice a week. This is in addition to the moderate-intensity exercise.  Maintain a healthy weight  Body mass index (BMI) is a measurement that can be used to identify possible weight problems. It estimates body fat based on height and weight. Your health care provider can help determine your BMI and help you achieve or maintain a healthy weight.  For females 26 years of age and older: ? A BMI below 18.5 is considered underweight. ? A BMI of 18.5 to 24.9 is normal. ? A BMI of 25 to 29.9 is considered overweight. ? A BMI of 30 and above is considered obese.  Watch levels of cholesterol and blood lipids  You should start having your blood tested for lipids and cholesterol at 66  years of age, then have this test every 5 years.  You may need to have your cholesterol levels checked more often if: ? Your lipid or cholesterol levels are high. ? You are older than 66 years of age. ? You are at high risk for heart disease.  Cancer screening Lung Cancer  Lung cancer screening is recommended for adults 66-66 years old who are at high risk for lung cancer because of a history of smoking.  A yearly low-dose CT scan of the lungs is recommended for people who: ? Currently smoke. ? Have quit within the past 15 years. ? Have at least a 30-pack-year history of smoking. A pack year is smoking an average of one pack of cigarettes a day for 1 year.  Yearly screening should continue until it has been 15 years since you quit.  Yearly screening should stop if you develop a health problem that would prevent you from having lung cancer treatment.  Breast Cancer  Practice breast self-awareness. This means understanding how your breasts normally appear and feel.  It also means doing regular breast self-exams. Let your health care provider know about any changes, no matter how small.  If you are in your 20s or 30s, you should have a clinical breast exam (CBE) by a health care provider every 1-3 years as part of a regular health exam.  If you are 24 or older, have a CBE every year. Also consider having a breast  X-ray (mammogram) every year.  If you have a family history of breast cancer, talk to your health care provider about genetic screening.  If you are at high risk for breast cancer, talk to your health care provider about having an MRI and a mammogram every year.  Breast cancer gene (BRCA) assessment is recommended for women who have family members with BRCA-related cancers. BRCA-related cancers include: ? Breast. ? Ovarian. ? Tubal. ? Peritoneal cancers.  Results of the assessment will determine the need for genetic counseling and BRCA1 and BRCA2 testing.  Cervical  Cancer Your health care provider may recommend that you be screened regularly for cancer of the pelvic organs (ovaries, uterus, and vagina). This screening involves a pelvic examination, including checking for microscopic changes to the surface of your cervix (Pap test). You may be encouraged to have this screening done every 3 years, beginning at age 66.  For women ages 66-66, health care providers may recommend pelvic exams and Pap testing every 3 years, or they may recommend the Pap and pelvic exam, combined with testing for human papilloma virus (HPV), every 5 years. Some types of HPV increase your risk of cervical cancer. Testing for HPV may also be done on women of any age with unclear Pap test results.  Other health care providers may not recommend any screening for nonpregnant women who are considered low risk for pelvic cancer and who do not have symptoms. Ask your health care provider if a screening pelvic exam is right for you.  If you have had past treatment for cervical cancer or a condition that could lead to cancer, you need Pap tests and screening for cancer for at least 20 years after your treatment. If Pap tests have been discontinued, your risk factors (such as having a new sexual partner) need to be reassessed to determine if screening should resume. Some women have medical problems that increase the chance of getting cervical cancer. In these cases, your health care provider may recommend more frequent screening and Pap tests.  Colorectal Cancer  This type of cancer can be detected and often prevented.  Routine colorectal cancer screening usually begins at 66 years of age and continues through 66 years of age.  Your health care provider may recommend screening at an earlier age if you have risk factors for colon cancer.  Your health care provider may also recommend using home test kits to check for hidden blood in the stool.  A small camera at the end of a tube can be used to  examine your colon directly (sigmoidoscopy or colonoscopy). This is done to check for the earliest forms of colorectal cancer.  Routine screening usually begins at age 66.  Direct examination of the colon should be repeated every 5-10 years through 66 years of age. However, you may need to be screened more often if early forms of precancerous polyps or small growths are found.  Skin Cancer  Check your skin from head to toe regularly.  Tell your health care provider about any new moles or changes in moles, especially if there is a change in a mole's shape or color.  Also tell your health care provider if you have a mole that is larger than the size of a pencil eraser.  Always use sunscreen. Apply sunscreen liberally and repeatedly throughout the day.  Protect yourself by wearing long sleeves, pants, a wide-brimmed hat, and sunglasses whenever you are outside.  Heart disease, diabetes, and high blood pressure  High blood  pressure causes heart disease and increases the risk of stroke. High blood pressure is more likely to develop in: ? People who have blood pressure in the high end of the normal range (130-139/85-89 mm Hg). ? People who are overweight or obese. ? People who are African American.  If you are 73-13 years of age, have your blood pressure checked every 3-5 years. If you are 25 years of age or older, have your blood pressure checked every year. You should have your blood pressure measured twice-once when you are at a hospital or clinic, and once when you are not at a hospital or clinic. Record the average of the two measurements. To check your blood pressure when you are not at a hospital or clinic, you can use: ? An automated blood pressure machine at a pharmacy. ? A home blood pressure monitor.  If you are between 32 years and 64 years old, ask your health care provider if you should take aspirin to prevent strokes.  Have regular diabetes screenings. This involves taking a  blood sample to check your fasting blood sugar level. ? If you are at a normal weight and have a low risk for diabetes, have this test once every three years after 66 years of age. ? If you are overweight and have a high risk for diabetes, consider being tested at a younger age or more often. Preventing infection Hepatitis B  If you have a higher risk for hepatitis B, you should be screened for this virus. You are considered at high risk for hepatitis B if: ? You were born in a country where hepatitis B is common. Ask your health care provider which countries are considered high risk. ? Your parents were born in a high-risk country, and you have not been immunized against hepatitis B (hepatitis B vaccine). ? You have HIV or AIDS. ? You use needles to inject street drugs. ? You live with someone who has hepatitis B. ? You have had sex with someone who has hepatitis B. ? You get hemodialysis treatment. ? You take certain medicines for conditions, including cancer, organ transplantation, and autoimmune conditions.  Hepatitis C  Blood testing is recommended for: ? Everyone born from [birth year] through 1965. ? Anyone with known risk factors for hepatitis C.  Sexually transmitted infections (STIs)  You should be screened for sexually transmitted infections (STIs) including gonorrhea and chlamydia if: ? You are sexually active and are younger than 66 years of age. ? You are older than 66 years of age and your health care provider tells you that you are at risk for this type of infection. ? Your sexual activity has changed since you were last screened and you are at an increased risk for chlamydia or gonorrhea. Ask your health care provider if you are at risk.  If you do not have HIV, but are at risk, it may be recommended that you take a prescription medicine daily to prevent HIV infection. This is called pre-exposure prophylaxis (PrEP). You are considered at risk if: ? You are sexually active and  do not regularly use condoms or know the HIV status of your partner(s). ? You take drugs by injection. ? You are sexually active with a partner who has HIV.  Talk with your health care provider about whether you are at high risk of being infected with HIV. If you choose to begin PrEP, you should first be tested for HIV. You should then be tested every 3 months for as  long as you are taking PrEP. Pregnancy  If you are premenopausal and you may become pregnant, ask your health care provider about preconception counseling.  If you may become pregnant, take 400 to 800 micrograms (mcg) of folic acid every day.  If you want to prevent pregnancy, talk to your health care provider about birth control (contraception). Osteoporosis and menopause  Osteoporosis is a disease in which the bones lose minerals and strength with aging. This can result in serious bone fractures. Your risk for osteoporosis can be identified using a bone density scan.  If you are 59 years of age or older, or if you are at risk for osteoporosis and fractures, ask your health care provider if you should be screened.  Ask your health care provider whether you should take a calcium or vitamin D supplement to lower your risk for osteoporosis.  Menopause may have certain physical symptoms and risks.  Hormone replacement therapy may reduce some of these symptoms and risks. Talk to your health care provider about whether hormone replacement therapy is right for you. Follow these instructions at home:  Schedule regular health, dental, and eye exams.  Stay current with your immunizations.  Do not use any tobacco products including cigarettes, chewing tobacco, or electronic cigarettes.  If you are pregnant, do not drink alcohol.  If you are breastfeeding, limit how much and how often you drink alcohol.  Limit alcohol intake to no more than 1 drink per day for nonpregnant women. One drink equals 12 ounces of beer, 5 ounces of  wine, or 1 ounces of hard liquor.  Do not use street drugs.  Do not share needles.  Ask your health care provider for help if you need support or information about quitting drugs.  Tell your health care provider if you often feel depressed.  Tell your health care provider if you have ever been abused or do not feel safe at home. This information is not intended to replace advice given to you by your health care provider. Make sure you discuss any questions you have with your health care provider. Document Released: 10/11/2010 Document Revised: 09/03/2015 Document Reviewed: 12/30/2014 Elsevier Interactive Patient Education  [employer].

## 2017-01-04 ENCOUNTER — Other Ambulatory Visit: Payer: Self-pay | Admitting: Internal Medicine

## 2017-01-04 NOTE — Assessment & Plan Note (Signed)
Taking atenolol, chlorthalidone, and lisinopril. Checking CMP and adjust as needed. BP at goal.

## 2017-01-04 NOTE — Assessment & Plan Note (Signed)
Given pneumonia 23 today to complete pneumonia series. Declines flu shot. Mammogram up to date. Colonoscopy due in 2019. Tetanus up to date. Counseled about shingrix. Counseled about sun safety and mole surveillance. Given 10 year screening recommendations.

## 2017-01-04 NOTE — Assessment & Plan Note (Signed)
Using lomotil and hyoscamine as needed. She is working with fodmap diet which is helping some as well.

## 2017-01-04 NOTE — Assessment & Plan Note (Signed)
Checking lipid panel and adjust simvastatin as needed for goal LDL <130.

## 2017-01-30 ENCOUNTER — Telehealth: Payer: Self-pay | Admitting: Family

## 2017-01-30 MED ORDER — LISINOPRIL 2.5 MG PO TABS: 2.5000 mg | ORAL_TABLET | Freq: Every day | ORAL | 1 refills | Status: DC

## 2017-01-30 NOTE — Telephone Encounter (Signed)
Reviewed chart pt is up-to-date sent refills to mail service...[name]

## 2017-01-30 NOTE — Telephone Encounter (Signed)
Please advise can [facility] prescribe?

## 2017-01-30 NOTE — Addendum Note (Signed)
Addended by: Earnstine Regal on: 01/30/2017 03:41 PM   Modules accepted: Orders

## 2017-02-07 ENCOUNTER — Ambulatory Visit (INDEPENDENT_AMBULATORY_CARE_PROVIDER_SITE_OTHER): Payer: Medicare Other | Admitting: Orthopaedic Surgery

## 2017-02-07 ENCOUNTER — Encounter (INDEPENDENT_AMBULATORY_CARE_PROVIDER_SITE_OTHER): Payer: Self-pay | Admitting: Orthopaedic Surgery

## 2017-02-07 VITALS — BP 120/60 | HR 72 | Resp 12 | Ht 62.0 in | Wt 165.0 lb

## 2017-02-07 DIAGNOSIS — G8929 Other chronic pain: Secondary | ICD-10-CM | POA: Diagnosis not present

## 2017-02-07 DIAGNOSIS — M25561 Pain in right knee: Secondary | ICD-10-CM | POA: Diagnosis not present

## 2017-02-07 NOTE — Progress Notes (Signed)
Office Visit Note   Patient: [name]           Date of Birth: [birth year]           MRN: [id number] Visit Date: 02/07/2017              Requested by: Hoyt Koch, MD [place], [place] [id number] PCP: Hoyt Koch, MD   Assessment & Plan: Visit Diagnoses:  1. Chronic pain of right knee     Plan: End-stage osteoarthritis right knee. [name] would like to proceed with a knee replacement. She's failed conservative treatment including cortisone, Visco supplementation, medicines time. She really feels compromised in her ability to ambulate and perform activities of daily living because of her right knee pain. We'll give her a clearance form for Dr. Sharlet Salina and set up surgery sometime in January  Follow-Up Instructions: Return will schedule right TKR.   Orders:  No orders of the defined types were placed in this encounter.  No orders of the defined types were placed in this encounter.     Procedures: No procedures performed   Clinical Data: No additional findings.   Subjective: Chief Complaint  Patient presents with  . Right Knee - Pain, Edema    [name] is a 66 y o that is here to discuss RIght knee surgery. Hx of L arthroscopy.  Long history of osteoarthritis of both knees. Presently having more trouble on the right. Prior films in March demonstrated bone-on-bone in the medial compartment consistent with end-stage osteoarthritis. To date she's tried over-the-counter medicines, exercises, cortisone and Visco supplementation.  HPI  Review of Systems  Constitutional: Negative for chills, fatigue and fever.  Eyes: Negative for itching.  Respiratory: Negative for chest tightness and shortness of breath.   Cardiovascular: Negative for chest pain, palpitations and leg swelling.  Gastrointestinal: Positive for diarrhea. Negative for blood in stool and constipation.  Endocrine: Negative for polyuria.  Genitourinary: Negative  for dysuria.  Musculoskeletal: Positive for back pain and joint swelling. Negative for neck stiffness.  Allergic/Immunologic: Negative for immunocompromised state.  Neurological: Negative for dizziness and numbness.  Hematological: Does not bruise/bleed easily.  Psychiatric/Behavioral: The patient is not nervous/anxious.      Objective: Vital Signs: BP 120/60   Pulse 72   Resp 12   Ht 5' 2"  (1.575 m)   Wt 165 lb (74.8 kg)   LMP 02/09/2005   BMI 30.18 kg/m   Physical Exam  Ortho Exam awake alert and oriented 3. Comfortable sitting. Does have somewhat of a limp more on the right than the left. No knee effusion. Full extension both knees about 98 of flexion. No instability. No popliteal pain or fullness. No calf pain. Neurovascular exam intact. Pain and positive patellar crepitation rate straight leg raise negative. Painless range of motion both hips  Specialty Comments:  No specialty comments available.  Imaging: No results found.   PMFS History: Patient Active Problem List   Diagnosis Date Noted  . Nausea without vomiting 09/01/2016  . Chronic diarrhea 12/07/2014  . Segmental colitis with rectal bleeding ([place]) 09/12/2014  . Routine general medical examination at a health care facility 06/05/2014  . Overweight 06/13/2011  . ABNORMAL CHEST XRAY 09/30/2007  . Hyperlipidemia 02/17/2007  . Essential hypertension 01/08/2007  . Osteoarthritis 01/08/2007   Past Medical History:  Diagnosis Date  . Arthritis    back, fingers with joint pain and swelling.  chronic back pain  . Chronic back pain  arthritis   . Diverticulosis   . Elevated cholesterol    takes Niacin daily and Simvastatin  . GERD (gastroesophageal reflux disease) 06/2004   non-specific gastritis on EGD 06/2004  . Headache(784.0)    occasionally  . History of colon polyps 2004, 2009   2004:adenomatous. 2009 hyperplastic.   Marland Kitchen Hypertension    takes Tenoretic and Lisinopril daily  . Mucoid cyst of joint  08/2013   right index finger  . Osteopenia 01/2014   T score -1.1 FRAX 14%/0.5%. Stable from prior DEXA  . PONV (postoperative nausea and vomiting)   . Seasonal allergies   . Stroke ([place]) 1998   x 2 - mild left-sided weakness  . Urge incontinence   . Uterine prolapse     Family History  Problem Relation Age of Onset  . Hypertension Mother   . Heart disease Mother   . Diabetes Father   . Hypertension Father   . Heart disease Father   . Stroke Father   . Diabetes Maternal Aunt   . Diabetes Paternal Grandmother   . Hypertension Paternal Grandfather   . Stroke Paternal Grandfather     Past Surgical History:  Procedure Laterality Date  . BACK SURGERY     x 2  . COLONOSCOPY  2004, 2009, 2014  . FINGER SURGERY    . GUM SURGERY    . GYNECOLOGIC CRYOSURGERY    . HYSTEROSCOPY W/D&C  12/07/2010   with resection of endometrial polyp  . KNEE ARTHROSCOPY Left 2008  . lip biopsy     done at MD office Fri 11/22/13  . MASS EXCISION Right 08/15/2013   Procedure: RIGHT INDEX EXCISION MASS ;  Surgeon: Tennis Must, MD;  Location: [facility];  Service: Orthopedics;  Laterality: Right;  . NASAL SEPTUM SURGERY    . OOPHORECTOMY Right 2000  . SHOULDER ARTHROSCOPY WITH OPEN ROTATOR CUFF REPAIR AND DISTAL CLAVICLE ACROMINECTOMY Right 11/26/2013   Procedure: RIGHT SHOULDER ARTHROSCOPY WITH MINI OPEN ROTATOR CUFF REPAIR AND DISTAL CLAVICLE RESECTION, SUBACROMIAL DECOMPRESSION, POSSIBLE [facility] PATCH.;  Surgeon: [name], MD;  Location: [facility];  Service: Orthopedics;  Laterality: Right;  . TUBAL LIGATION     Social History   Occupational History  . [employer]     Disability/Retired   Social History Main Topics  . Smoking status: Passive Smoke Exposure - Never Smoker  . Smokeless tobacco: Never Used  . Alcohol use 0.0 oz/week     Comment: rarely  . Drug use: No  . Sexual activity: Yes    Partners: Male    Birth control/ protection: Surgical,  Post-menopausal     Comment: BTL-1st intercourse 66 yo-More than 5 partners

## 2017-03-07 NOTE — H&P (Addendum)
Joni Fears, MD   Biagio Borg, PA-C [address], [place], [place]  [address]                             [phone]   ORTHOPAEDIC HISTORY & PHYSICAL  [name] MRN:  [id number] DOB/SEX:  28-May-1950/female  CHIEF COMPLAINT:  Painful right Knee  HISTORY: Patient is a 66 y.o. female presented with a history of pain in the right knee for 10 years. Onset of symptoms was gradual starting 4 years ago with gradually worsening course since that time. Prior procedures on the knee are none. Patient has been treated conservatively with over-the-counter NSAIDs, injections, and activity modification. Patient currently rates pain in the knee at 7 out of 10 with activity. There is pain at night. present.  They have been previously treated with: NSAIDS: NSAID with mild improvement  Knee injection with corticosteroid  was performed Knee injection with visco supplementation was performed Medications: NSAID with mild improvement  PAST MEDICAL HISTORY: Patient Active Problem List   Diagnosis Date Noted  . Nausea without vomiting 09/01/2016  . Chronic diarrhea 12/07/2014  . Segmental colitis with rectal bleeding ([place]) 09/12/2014  . Routine general medical examination at a health care facility 06/05/2014  . Overweight 06/13/2011  . ABNORMAL CHEST XRAY 09/30/2007  . Hyperlipidemia 02/17/2007  . Essential hypertension 01/08/2007  . Osteoarthritis 01/08/2007   Past Medical History:  Diagnosis Date  . Arthritis    back, fingers with joint pain and swelling.  chronic back pain  . Chronic back pain    arthritis   . Diverticulosis   . Elevated cholesterol    takes Niacin daily and Simvastatin  . GERD (gastroesophageal reflux disease) 06/2004   non-specific gastritis on EGD 06/2004  . Headache(784.0)    occasionally  . History of colon polyps 2004, 2009   2004:adenomatous. 2009 hyperplastic.   Marland Kitchen Hypertension    takes Tenoretic and Lisinopril daily  . Mucoid cyst of joint  08/2013   right index finger  . Osteopenia 01/2014   T score -1.1 FRAX 14%/0.5%. Stable from prior DEXA  . PONV (postoperative nausea and vomiting)   . Seasonal allergies   . Stroke ([place]) 1998   x 2 - mild left-sided weakness  . Urge incontinence   . Uterine prolapse    Past Surgical History:  Procedure Laterality Date  . BACK SURGERY     x 2  . COLONOSCOPY  2004, 2009, 2014  . FINGER SURGERY    . GUM SURGERY    . GYNECOLOGIC CRYOSURGERY    . HYSTEROSCOPY W/D&C  12/07/2010   with resection of endometrial polyp  . KNEE ARTHROSCOPY Left 2008  . lip biopsy     done at MD office Fri 11/22/13  . MASS EXCISION Right 08/15/2013   Procedure: RIGHT INDEX EXCISION MASS ;  Surgeon: Tennis Must, MD;  Location: [facility];  Service: Orthopedics;  Laterality: Right;  . NASAL SEPTUM SURGERY    . OOPHORECTOMY Right 2000  . SHOULDER ARTHROSCOPY WITH OPEN ROTATOR CUFF REPAIR AND DISTAL CLAVICLE ACROMINECTOMY Right 11/26/2013   Procedure: RIGHT SHOULDER ARTHROSCOPY WITH MINI OPEN ROTATOR CUFF REPAIR AND DISTAL CLAVICLE RESECTION, SUBACROMIAL DECOMPRESSION, POSSIBLE [facility] PATCH.;  Surgeon: [name], MD;  Location: [facility];  Service: Orthopedics;  Laterality: Right;  . TUBAL LIGATION       MEDICATIONS PRIOR TO ADMISSION: No current facility-administered medications for this  encounter.   Current Outpatient Medications:  .  acetaminophen (TYLENOL) 500 MG tablet, Take 1,000 mg by mouth every 8 (eight) hours as needed for mild pain., Disp: , Rfl:  .  Ascorbic Acid (VITAMIN C) 1000 MG tablet, Take 1,000 mg by mouth daily., Disp: , Rfl:  .  aspirin 81 MG tablet, Take 1 tablet (81 mg total) by mouth 2 (two) times daily. Resume in 1 week., Disp: , Rfl:  .  atenolol-chlorthalidone (TENORETIC) 50-25 MG tablet, Take 1 tablet by mouth daily., Disp: 90 tablet, Rfl: 1 .  b complex vitamins tablet, Take 1 tablet by mouth daily., Disp: , Rfl:  .  Calcium Carbonate-Vitamin D (CALCIUM + D  PO), Take 1 tablet by mouth daily. , Disp: , Rfl:  .  Cholecalciferol (D 5000) 5000 units capsule, Take 5,000 Units by mouth daily., Disp: , Rfl:  .  Chromium 1 MG CAPS, Take 1 mg by mouth daily. , Disp: , Rfl:  .  Coenzyme Q10 (COQ10) 100 MG CAPS, Take 100 mg by mouth every evening., Disp: , Rfl:  .  diphenoxylate-atropine (LOMOTIL) 2.5-0.025 MG tablet, TAKE ONE TABLET BY MOUTH 3 TIMES DAILY AS NEEDED FOR  DIARRHEA  OR  LOOSE  STOOLS (Patient taking differently: Take 1 tablet by mouth 3 (three) times daily as needed for diarrhea or loose stools. ), Disp: 90 tablet, Rfl: 0 .  folic acid (FOLVITE) 569 MCG tablet, Take 800 mcg by mouth every evening., Disp: , Rfl:  .  ketotifen (ZADITOR) 0.025 % ophthalmic solution, Place 2 drops into both eyes as needed (allergies)., Disp: , Rfl:  .  KLOR-CON M20 20 MEQ tablet, TAKE 1 TABLET TWICE A DAY, Disp: 180 tablet, Rfl: 1 .  lisinopril (PRINIVIL,ZESTRIL) 2.5 MG tablet, Take 1 tablet (2.5 mg total) by mouth daily., Disp: 90 tablet, Rfl: 1 .  Magnesium 250 MG TABS, Take 250 mg by mouth 2 (two) times daily., Disp: , Rfl:  .  Melatonin 3 MG TABS, Take 3 mg by mouth at bedtime., Disp: , Rfl:  .  methocarbamol (ROBAXIN) 500 MG tablet, TAKE 1 TABLET EVERY 6 HOURSAS NEEDED FOR MUSCLE SPASMS, Disp: 90 tablet, Rfl: 0 .  Multiple Vitamin (MULTIVITAMIN) tablet, Take 1 tablet by mouth daily.  , Disp: , Rfl:  .  pyridOXINE (VITAMIN B-6) 100 MG tablet, Take 100 mg by mouth every evening. , Disp: , Rfl:  .  simvastatin (ZOCOR) 20 MG tablet, Take 1 tablet (20 mg total) by mouth daily. (Patient taking differently: Take 20 mg by mouth daily at 6 PM. ), Disp: 90 tablet, Rfl: 1 .  vitamin E 400 UNIT capsule, Take 400 Units by mouth daily., Disp: , Rfl:  .  hyoscyamine (LEVSIN SL) 0.125 MG SL tablet, Take one tablet by mouth before meals three times a day (Patient not taking: Reported on 03/07/2017), Disp: 90 tablet, Rfl: 11 .  Zinc Sulfate (ZINC 15 PO), Take 1 tablet by mouth  daily. Liquid Blend 10 drops equal 15 mg, Disp: , Rfl:    ALLERGIES:   Allergies  Allergen Reactions  . Eggs Or Egg-Derived Products Other (See Comments)    GI UPSET, JOINT PAIN, DIFF. SWALLOWING  . Etodolac Other (See Comments)    ABD. CRAMPING  . Fish-Derived Products Other (See Comments)    GI UPSET, JOINT PAIN, DIFF. SWALLOWING   . Omeprazole Other (See Comments)    ABD. CRAMPING  . Pineapple Other (See Comments)    GI UPSET, JOINT PAIN, DIFF. SWALLOWING  REVIEW OF SYSTEMS:  .Review of Systems  Constitutional: Positive for activity change.  HENT: Negative for hearing loss.   Eyes: Negative for pain.  Respiratory: Negative for shortness of breath.   Cardiovascular: Negative for leg swelling.  Gastrointestinal: Positive for constipation and diarrhea.  Endocrine: Negative for cold intolerance.  Genitourinary: Negative for difficulty urinating.  Musculoskeletal: Positive for back pain, joint swelling, neck pain and neck stiffness.  Skin: Negative for rash.  Allergic/Immunologic: Positive for food allergies.  Neurological: Positive for numbness.  Hematological: Does not bruise/bleed easily.  Psychiatric/Behavioral: Positive for sleep disturbance.   FAMILY HISTORY:   Family History  Problem Relation Age of Onset  . Hypertension Mother   . Heart disease Mother   . Diabetes Father   . Hypertension Father   . Heart disease Father   . Stroke Father   . Diabetes Maternal Aunt   . Diabetes Paternal Grandmother   . Hypertension Paternal Grandfather   . Stroke Paternal Grandfather     SOCIAL HISTORY:   Social History   Occupational History  . Occupation: Animal nutritionist    Comment: Disability/Retired  Tobacco Use  . Smoking status: Passive Smoke Exposure - Never Smoker  . Smokeless tobacco: Never Used  Substance and Sexual Activity  . Alcohol use: Yes    Alcohol/week: 0.0 oz    Comment: rarely  . Drug use: No  . Sexual activity: Yes    Partners:  Male    Birth control/protection: Surgical, Post-menopausal    Comment: BTL-1st intercourse 66 yo-More than 5 partners     EXAMINATION:  Vital signs in last 24 hours: LMP 02/09/2005   Physical Exam  Constitutional: She is oriented to person, place, and time. She appears well-developed and well-nourished.  HENT:  Head: Normocephalic and atraumatic.  Eyes: Conjunctivae and EOM are normal. Pupils are equal, round, and reactive to light.  Neck: Neck supple.  Cardiovascular: Normal rate, regular rhythm, normal heart sounds and intact distal pulses.  Pulmonary/Chest: Effort normal and breath sounds normal. She has no wheezes.  Abdominal: Bowel sounds are normal. There is no tenderness.  Neurological: She is alert and oriented to person, place, and time.  Skin: Skin is warm and dry.  Psychiatric: She has a normal mood and affect. Her behavior is normal. Judgment and thought content normal.   Ortho Exam  Range of motion today reveals near full extension to about 95. Trace effusion. Tenderness along the medial joint line with what appears to be some spurring noted. Does have crepitus with range of motion. Calf is supple and nontender.   Imaging Review Plain radiographs demonstrate moderate degenerative joint disease of the right knee. The overall alignment is significant varus. The bone quality appears to be good for age and reported activity level. Bone-on-bone medial compartment with periarticular spurring and sclerosing of both the distal femoral condyle and proximal medial tibial plateau.  ASSESSMENT: End stage arthritis, right knee  Past Medical History:  Diagnosis Date  . Arthritis    back, fingers with joint pain and swelling.  chronic back pain  . Chronic back pain    arthritis   . Diverticulosis   . Elevated cholesterol    takes Niacin daily and Simvastatin  . GERD (gastroesophageal reflux disease) 06/2004   non-specific gastritis on EGD 06/2004  . Headache(784.0)     occasionally  . History of colon polyps 2004, 2009   2004:adenomatous. 2009 hyperplastic.   Marland Kitchen Hypertension    takes Tenoretic and Lisinopril  daily  . Mucoid cyst of joint 08/2013   right index finger  . Osteopenia 01/2014   T score -1.1 FRAX 14%/0.5%. Stable from prior DEXA  . PONV (postoperative nausea and vomiting)   . Seasonal allergies   . Stroke ([place]) 1998   x 2 - mild left-sided weakness  . Urge incontinence   . Uterine prolapse     PLAN: Plan for right total knee replacement.  The patient history, physical examination and imaging studies are consistent with moderate degenerative joint disease of the right knee. The patient has failed conservative treatment.  The clearance notes were reviewed.  After discussion with the patient it was felt that Total Knee Replacement was indicated. The procedure,  risks, and benefits of total knee arthroplasty were presented and reviewed. The risks including but not limited to aseptic loosening, infection, blood clots, vascular and nerve injury, stiffness, patella tracking problems and fracture complications among others were discussed. The patient acknowledged the explanation, agreed to proceed with total knee replacement.  Mike Craze [place], [place] [phone]  03/07/2017 4:20 PM

## 2017-03-08 ENCOUNTER — Encounter (INDEPENDENT_AMBULATORY_CARE_PROVIDER_SITE_OTHER): Payer: Self-pay | Admitting: Orthopedic Surgery

## 2017-03-08 ENCOUNTER — Ambulatory Visit (INDEPENDENT_AMBULATORY_CARE_PROVIDER_SITE_OTHER): Payer: Medicare Other | Admitting: Orthopedic Surgery

## 2017-03-08 VITALS — BP 111/71 | HR 65 | Temp 97.9°F | Resp 16 | Ht 60.3 in | Wt 177.0 lb

## 2017-03-08 DIAGNOSIS — M1711 Unilateral primary osteoarthritis, right knee: Secondary | ICD-10-CM | POA: Diagnosis not present

## 2017-03-08 NOTE — Progress Notes (Signed)
CHIEF COMPLAINT:  Painful right Knee  HISTORY: Patient is a 66 y.o. female presented with a history of pain in the right knee for 10 years. Onset of symptoms was gradual starting 4 years ago with gradually worsening course since that time. Prior procedures on the knee are none. Patient has been treated conservatively with over-the-counter NSAIDs, injections, and activity modification. Patient currently rates pain in the knee at 7 out of 10 with activity. There is pain at night. present.  They have been previously treated with: NSAIDS: NSAID with mild improvement  Knee injection with corticosteroid  was performed Knee injection with visco supplementation was performed Medications: NSAID with mild improvement  PAST MEDICAL HISTORY: Patient Active Problem List   Diagnosis Date Noted  . Nausea without vomiting 09/01/2016  . Chronic diarrhea 12/07/2014  . Segmental colitis with rectal bleeding ([place]) 09/12/2014  . Routine general medical examination at a health care facility 06/05/2014  . Overweight 06/13/2011  . ABNORMAL CHEST XRAY 09/30/2007  . Hyperlipidemia 02/17/2007  . Essential hypertension 01/08/2007  . Osteoarthritis 01/08/2007   Past Medical History:  Diagnosis Date  . Arthritis    back, fingers with joint pain and swelling.  chronic back pain  . Chronic back pain    arthritis   . Diverticulosis   . Elevated cholesterol    takes Niacin daily and Simvastatin  . GERD (gastroesophageal reflux disease) 06/2004   non-specific gastritis on EGD 06/2004  . Headache(784.0)    occasionally  . History of colon polyps 2004, 2009   2004:adenomatous. 2009 hyperplastic.   Marland Kitchen Hypertension    takes Tenoretic and Lisinopril daily  . Mucoid cyst of joint 08/2013   right index finger  . Osteopenia 01/2014   T score -1.1 FRAX 14%/0.5%. Stable from prior DEXA  . PONV (postoperative nausea and vomiting)   . Seasonal allergies   . Stroke ([place]) 1998   x 2 - mild left-sided weakness  . Urge  incontinence   . Uterine prolapse    Past Surgical History:  Procedure Laterality Date  . BACK SURGERY     x 2  . COLONOSCOPY  2004, 2009, 2014  . FINGER SURGERY    . GUM SURGERY    . GYNECOLOGIC CRYOSURGERY    . HYSTEROSCOPY W/D&C  12/07/2010   with resection of endometrial polyp  . KNEE ARTHROSCOPY Left 2008  . lip biopsy     done at MD office Fri 11/22/13  . MASS EXCISION Right 08/15/2013   Procedure: RIGHT INDEX EXCISION MASS ;  Surgeon: Tennis Must, MD;  Location: [facility];  Service: Orthopedics;  Laterality: Right;  . NASAL SEPTUM SURGERY    . OOPHORECTOMY Right 2000  . SHOULDER ARTHROSCOPY WITH OPEN ROTATOR CUFF REPAIR AND DISTAL CLAVICLE ACROMINECTOMY Right 11/26/2013   Procedure: RIGHT SHOULDER ARTHROSCOPY WITH MINI OPEN ROTATOR CUFF REPAIR AND DISTAL CLAVICLE RESECTION, SUBACROMIAL DECOMPRESSION, POSSIBLE [facility] PATCH.;  Surgeon: [name], MD;  Location: [facility];  Service: Orthopedics;  Laterality: Right;  . TUBAL LIGATION       MEDICATIONS PRIOR TO ADMISSION: No current facility-administered medications for this encounter.   Current Outpatient Medications:  .  acetaminophen (TYLENOL) 500 MG tablet, Take 1,000 mg by mouth every 8 (eight) hours as needed for mild pain., Disp: , Rfl:  .  Ascorbic Acid (VITAMIN C) 1000 MG tablet, Take 1,000 mg by mouth daily., Disp: , Rfl:  .  aspirin 81 MG tablet, Take 1 tablet (81 mg total) by  mouth 2 (two) times daily. Resume in 1 week., Disp: , Rfl:  .  atenolol-chlorthalidone (TENORETIC) 50-25 MG tablet, Take 1 tablet by mouth daily., Disp: 90 tablet, Rfl: 1 .  b complex vitamins tablet, Take 1 tablet by mouth daily., Disp: , Rfl:  .  Calcium Carbonate-Vitamin D (CALCIUM + D PO), Take 1 tablet by mouth daily. , Disp: , Rfl:  .  Cholecalciferol (D 5000) 5000 units capsule, Take 5,000 Units by mouth daily., Disp: , Rfl:  .  Chromium 1 MG CAPS, Take 1 mg by mouth daily. , Disp: , Rfl:  .  Coenzyme Q10 (COQ10) 100  MG CAPS, Take 100 mg by mouth every evening., Disp: , Rfl:  .  diphenoxylate-atropine (LOMOTIL) 2.5-0.025 MG tablet, TAKE ONE TABLET BY MOUTH 3 TIMES DAILY AS NEEDED FOR  DIARRHEA  OR  LOOSE  STOOLS (Patient taking differently: Take 1 tablet by mouth 3 (three) times daily as needed for diarrhea or loose stools. ), Disp: 90 tablet, Rfl: 0 .  folic acid (FOLVITE) 387 MCG tablet, Take 800 mcg by mouth every evening., Disp: , Rfl:  .  ketotifen (ZADITOR) 0.025 % ophthalmic solution, Place 2 drops into both eyes as needed (allergies)., Disp: , Rfl:  .  KLOR-CON M20 20 MEQ tablet, TAKE 1 TABLET TWICE A DAY, Disp: 180 tablet, Rfl: 1 .  lisinopril (PRINIVIL,ZESTRIL) 2.5 MG tablet, Take 1 tablet (2.5 mg total) by mouth daily., Disp: 90 tablet, Rfl: 1 .  Magnesium 250 MG TABS, Take 250 mg by mouth 2 (two) times daily., Disp: , Rfl:  .  Melatonin 3 MG TABS, Take 3 mg by mouth at bedtime., Disp: , Rfl:  .  methocarbamol (ROBAXIN) 500 MG tablet, TAKE 1 TABLET EVERY 6 HOURSAS NEEDED FOR MUSCLE SPASMS, Disp: 90 tablet, Rfl: 0 .  Multiple Vitamin (MULTIVITAMIN) tablet, Take 1 tablet by mouth daily.  , Disp: , Rfl:  .  pyridOXINE (VITAMIN B-6) 100 MG tablet, Take 100 mg by mouth every evening. , Disp: , Rfl:  .  simvastatin (ZOCOR) 20 MG tablet, Take 1 tablet (20 mg total) by mouth daily. (Patient taking differently: Take 20 mg by mouth daily at 6 PM. ), Disp: 90 tablet, Rfl: 1 .  vitamin E 400 UNIT capsule, Take 400 Units by mouth daily., Disp: , Rfl:  .  hyoscyamine (LEVSIN SL) 0.125 MG SL tablet, Take one tablet by mouth before meals three times a day (Patient not taking: Reported on 03/07/2017), Disp: 90 tablet, Rfl: 11 .  Zinc Sulfate (ZINC 15 PO), Take 1 tablet by mouth daily. Liquid Blend 10 drops equal 15 mg, Disp: , Rfl:    ALLERGIES:   Allergies  Allergen Reactions  . Eggs Or Egg-Derived Products Other (See Comments)    GI UPSET, JOINT PAIN, DIFF. SWALLOWING  . Etodolac Other (See Comments)    ABD.  CRAMPING  . Fish-Derived Products Other (See Comments)    GI UPSET, JOINT PAIN, DIFF. SWALLOWING   . Omeprazole Other (See Comments)    ABD. CRAMPING  . Pineapple Other (See Comments)    GI UPSET, JOINT PAIN, DIFF. SWALLOWING    REVIEW OF SYSTEMS:  .Review of Systems  Constitutional: Positive for activity change.  HENT: Negative for hearing loss.   Eyes: Negative for pain.  Respiratory: Negative for shortness of breath.   Cardiovascular: Negative for leg swelling.  Gastrointestinal: Positive for constipation and diarrhea.  Endocrine: Negative for cold intolerance.  Genitourinary: Negative for difficulty urinating.  Musculoskeletal: Positive  for back pain, joint swelling, neck pain and neck stiffness.  Skin: Negative for rash.  Allergic/Immunologic: Positive for food allergies.  Neurological: Positive for numbness.  Hematological: Does not bruise/bleed easily.  Psychiatric/Behavioral: Positive for sleep disturbance.   FAMILY HISTORY:   Family History  Problem Relation Age of Onset  . Hypertension Mother   . Heart disease Mother   . Diabetes Father   . Hypertension Father   . Heart disease Father   . Stroke Father   . Diabetes Maternal Aunt   . Diabetes Paternal Grandmother   . Hypertension Paternal Grandfather   . Stroke Paternal Grandfather     SOCIAL HISTORY:   Social History   Occupational History  . Occupation: Animal nutritionist    Comment: Disability/Retired  Tobacco Use  . Smoking status: Passive Smoke Exposure - Never Smoker  . Smokeless tobacco: Never Used  Substance and Sexual Activity  . Alcohol use: Yes    Alcohol/week: 0.0 oz    Comment: rarely  . Drug use: No  . Sexual activity: Yes    Partners: Male    Birth control/protection: Surgical, Post-menopausal    Comment: BTL-1st intercourse 66 yo-More than 5 partners     EXAMINATION:  Vital signs in last 24 hours: LMP 02/09/2005   Physical Exam  Constitutional: She is oriented to  person, place, and time. She appears well-developed and well-nourished.  HENT:  Head: Normocephalic and atraumatic.  Eyes: Conjunctivae and EOM are normal. Pupils are equal, round, and reactive to light.  Neck: Neck supple.  Cardiovascular: Normal rate, regular rhythm, normal heart sounds and intact distal pulses.  Pulmonary/Chest: Effort normal and breath sounds normal. She has no wheezes.  Abdominal: Bowel sounds are normal. There is no tenderness.  Neurological: She is alert and oriented to person, place, and time.  Skin: Skin is warm and dry.  Psychiatric: She has a normal mood and affect. Her behavior is normal. Judgment and thought content normal.   Ortho Exam  Range of motion today reveals near full extension to about 95. Trace effusion. Tenderness along the medial joint line with what appears to be some spurring noted. Does have crepitus with range of motion. Calf is supple and nontender.   Imaging Review Plain radiographs demonstrate moderate degenerative joint disease of the right knee. The overall alignment is significant varus. The bone quality appears to be good for age and reported activity level. Bone-on-bone medial compartment with periarticular spurring and sclerosing of both the distal femoral condyle and proximal medial tibial plateau.  ASSESSMENT: End stage arthritis, right knee  Past Medical History:  Diagnosis Date  . Arthritis    back, fingers with joint pain and swelling.  chronic back pain  . Chronic back pain    arthritis   . Diverticulosis   . Elevated cholesterol    takes Niacin daily and Simvastatin  . GERD (gastroesophageal reflux disease) 06/2004   non-specific gastritis on EGD 06/2004  . Headache(784.0)    occasionally  . History of colon polyps 2004, 2009   2004:adenomatous. 2009 hyperplastic.   Marland Kitchen Hypertension    takes Tenoretic and Lisinopril daily  . Mucoid cyst of joint 08/2013   right index finger  . Osteopenia 01/2014   T score -1.1 FRAX  14%/0.5%. Stable from prior DEXA  . PONV (postoperative nausea and vomiting)   . Seasonal allergies   . Stroke ([place]) 1998   x 2 - mild left-sided weakness  . Urge incontinence   .  Uterine prolapse     PLAN: Plan for right total knee replacement.  The patient history, physical examination and imaging studies are consistent with moderate degenerative joint disease of the right knee. The patient has failed conservative treatment.  The clearance notes were reviewed.  After discussion with the patient it was felt that Total Knee Replacement was indicated. The procedure,  risks, and benefits of total knee arthroplasty were presented and reviewed. The risks including but not limited to aseptic loosening, infection, blood clots, vascular and nerve injury, stiffness, patella tracking problems and fracture complications among others were discussed. The patient acknowledged the explanation, agreed to proceed with total knee replacement.  Face-to-face time spent with patient was greater than 40 minutes.  Greater than 50% of the time was spent in counseling and coordination of care.  Mike Craze [name], [place] [phone]  03/07/2017 4:20 PM

## 2017-03-08 NOTE — Progress Notes (Deleted)
Office Visit Note   Patient: [name]           Date of Birth: 11-Aug-1950           MRN: [id number] Visit Date: 03/08/2017              Requested by: Hoyt Koch, MD [place], [place] [id number] PCP: Hoyt Koch, MD   Assessment & Plan: Visit Diagnoses: No diagnosis found.  Plan: ***  Follow-Up Instructions: No Follow-up on file.   Orders:  No orders of the defined types were placed in this encounter.  No orders of the defined types were placed in this encounter.     Procedures: No procedures performed   Clinical Data: No additional findings.   Subjective: Chief Complaint  Patient presents with  . Right Knee - Pre-op Exam  . Knee Pain    Right total knee 03/21/17    HPI  Review of Systems  Constitutional: Positive for activity change.  HENT: Negative for hearing loss.   Eyes: Negative for pain.  Respiratory: Negative for shortness of breath.   Cardiovascular: Negative for leg swelling.  Gastrointestinal: Positive for constipation and diarrhea.  Endocrine: Negative for cold intolerance.  Genitourinary: Negative for difficulty urinating.  Musculoskeletal: Positive for back pain, joint swelling, neck pain and neck stiffness.  Skin: Negative for rash.  Allergic/Immunologic: Positive for food allergies.  Neurological: Positive for numbness.  Hematological: Does not bruise/bleed easily.  Psychiatric/Behavioral: Positive for sleep disturbance.     Objective: Vital Signs: BP 111/71 (BP Location: Right Arm, Patient Position: Sitting, Cuff Size: Normal)   Pulse 65   Temp 97.9 F (36.6 C)   Resp 16   Ht 5' 0.3" (1.532 m)   Wt 177 lb (80.3 kg)   LMP 02/09/2005   BMI 34.22 kg/m   Physical Exam  Ortho Exam  Specialty Comments:  No specialty comments available.  Imaging: No results found.   PMFS History: Patient Active Problem List   Diagnosis Date Noted  . Nausea without vomiting 09/01/2016  .  Chronic diarrhea 12/07/2014  . Segmental colitis with rectal bleeding ([place]) 09/12/2014  . Routine general medical examination at a health care facility 06/05/2014  . Overweight 06/13/2011  . ABNORMAL CHEST XRAY 09/30/2007  . Hyperlipidemia 02/17/2007  . Essential hypertension 01/08/2007  . Osteoarthritis 01/08/2007   Past Medical History:  Diagnosis Date  . Arthritis    back, fingers with joint pain and swelling.  chronic back pain  . Chronic back pain    arthritis   . Diverticulosis   . Elevated cholesterol    takes Niacin daily and Simvastatin  . GERD (gastroesophageal reflux disease) 06/2004   non-specific gastritis on EGD 06/2004  . Headache(784.0)    occasionally  . History of colon polyps 2004, 2009   2004:adenomatous. 2009 hyperplastic.   Marland Kitchen Hypertension    takes Tenoretic and Lisinopril daily  . Mucoid cyst of joint 08/2013   right index finger  . Osteopenia 01/2014   T score -1.1 FRAX 14%/0.5%. Stable from prior DEXA  . PONV (postoperative nausea and vomiting)   . Seasonal allergies   . Stroke ([place]) 1998   x 2 - mild left-sided weakness  . Urge incontinence   . Uterine prolapse     Family History  Problem Relation Age of Onset  . Hypertension Mother   . Heart disease Mother   . Diabetes Father   . Hypertension Father   . Heart  disease Father   . Stroke Father   . Diabetes Maternal Aunt   . Diabetes Paternal Grandmother   . Hypertension Paternal Grandfather   . Stroke Paternal Grandfather     Past Surgical History:  Procedure Laterality Date  . BACK SURGERY     x 2  . COLONOSCOPY  2004, 2009, 2014  . FINGER SURGERY    . GUM SURGERY    . GYNECOLOGIC CRYOSURGERY    . HYSTEROSCOPY W/D&C  12/07/2010   with resection of endometrial polyp  . KNEE ARTHROSCOPY Left 2008  . lip biopsy     done at MD office Fri 11/22/13  . MASS EXCISION Right 08/15/2013   Procedure: RIGHT INDEX EXCISION MASS ;  Surgeon: Tennis Must, MD;  Location: [place];   Service: Orthopedics;  Laterality: Right;  . NASAL SEPTUM SURGERY    . OOPHORECTOMY Right 2000  . SHOULDER ARTHROSCOPY WITH OPEN ROTATOR CUFF REPAIR AND DISTAL CLAVICLE ACROMINECTOMY Right 11/26/2013   Procedure: RIGHT SHOULDER ARTHROSCOPY WITH MINI OPEN ROTATOR CUFF REPAIR AND DISTAL CLAVICLE RESECTION, SUBACROMIAL DECOMPRESSION, POSSIBLE [facility] PATCH.;  Surgeon: [name], MD;  Location: [place];  Service: Orthopedics;  Laterality: Right;  . TUBAL LIGATION     Social History   Occupational History  . Occupation: Animal nutritionist    Comment: Disability/Retired  Tobacco Use  . Smoking status: Passive Smoke Exposure - Never Smoker  . Smokeless tobacco: Never Used  Substance and Sexual Activity  . Alcohol use: No    Alcohol/week: 0.0 oz    Frequency: Never  . Drug use: No  . Sexual activity: Yes    Partners: Male    Birth control/protection: Surgical, Post-menopausal    Comment: BTL-1st intercourse 66 yo-More than 5 partners

## 2017-03-09 ENCOUNTER — Encounter (INDEPENDENT_AMBULATORY_CARE_PROVIDER_SITE_OTHER): Payer: Self-pay | Admitting: Orthopedic Surgery

## 2017-03-13 NOTE — Pre-Procedure Instructions (Signed)
ANALEYA LUALLEN  03/13/2017      [id number]: [phone]: [phone]   Your procedure is scheduled on Tuesday 03/21/2017.  [facility]  Call this number if you have problems the morning of surgery:  [phone]   Remember:  Do not eat food or drink liquids after midnight.   Continue all medications as directed by your physician except follow these medication instructions before surgery below   Take these medicines the morning of surgery with A SIP OF WATER: Acetaminophen (Tylenol) - if needed Ketotifen (Zaditor) eye drops - if needed Methocarbamol (Robaxin) - if needed  7 days prior to surgery STOP taking any Aspirin(unless otherwise instructed by your surgeon), Aleve, Naproxen, Ibuprofen, Motrin, Advil, Goody's, BC's, all herbal medications, fish oil, and all vitamins - Do NOT stop your potassium supplement.    Do not wear jewelry, make-up or nail polish.  Do not wear lotions, powders, or perfumes, or deodorant.  Do not shave 48 hours prior to surgery.    Do not bring valuables to the hospital.  [facility] belongings or [facility]ables.  Contacts, eyeglasses, dentures or bridgework may not be worn into surgery.  Leave your suitcase in the car.  After surgery it may be brought to your room.  For patients admitted to the hospital, discharge time will be determined by your treatment team.  Patients discharged the day of surgery will not be allowed to drive home.   Name and phone number of your driver:   Special instructions:   [place] [place] Preparing For [facility]  Before surgery, you can play an important role. Because skin is not sterile, your skin needs to be as free of germs as possible. You can reduce the number of germs on your skin by washing with CHG (chlorahexidine gluconate)  Soap before surgery.  CHG is an antiseptic cleaner which kills germs and bonds with the skin to continue killing germs even after washing.  Please do not use if you have an allergy to CHG or antibacterial soaps. If your skin becomes reddened/irritated stop using the CHG.  Do not shave (including legs and underarms) for at least 48 hours prior to first CHG shower. It is OK to shave your face.  Please follow these instructions carefully.   1. Shower the NIGHT BEFORE SURGERY and the MORNING OF SURGERY with CHG.   2. If you chose to wash your hair, wash your hair first as usual with your normal shampoo.  3. After you shampoo, rinse your hair and body thoroughly to remove the shampoo.  4. Use CHG as you would any other liquid soap. You can apply CHG directly to the skin and wash gently with a scrungie or a clean washcloth.   5. Apply the CHG Soap to your body ONLY FROM THE NECK DOWN.  Do not use on open wounds or open sores. Avoid contact with your eyes, ears, mouth and genitals (private parts). Wash Face and genitals (private parts)  with your normal soap.  6. Wash thoroughly, paying special attention to the area where your surgery will be performed.  7. Thoroughly rinse your body with warm water from the neck down.  8. DO NOT shower/wash with your normal soap after using and rinsing off the CHG Soap.  9. Pat yourself dry with a  CLEAN TOWEL.  10. Wear CLEAN PAJAMAS to bed the night before surgery, wear comfortable clothes the morning of surgery  11. Place CLEAN SHEETS on your bed the night of your first shower and DO NOT SLEEP WITH PETS.    Day of Surgery: Shower as stated above. Do not apply any deodorants/lotions.  Please wear clean clothes to the hospital/surgery center.      Please read over the following fact sheets that you were given. Pain Booklet, Coughing and Deep Breathing, Total Joint Packet and Surgical Site Infection Prevention

## 2017-03-14 ENCOUNTER — Encounter (HOSPITAL_COMMUNITY)
Admission: RE | Admit: 2017-03-14 | Discharge: 2017-03-14 | Disposition: A | Discharge status: Home / Self Care | Payer: Medicare Other | Source: Ambulatory Visit | Attending: Orthopaedic Surgery | Admitting: Orthopaedic Surgery

## 2017-03-14 ENCOUNTER — Other Ambulatory Visit: Payer: Self-pay

## 2017-03-14 ENCOUNTER — Encounter (HOSPITAL_COMMUNITY)
Admission: RE | Admit: 2017-03-14 | Discharge: 2017-03-14 | Disposition: A | Discharge status: Home / Self Care | Payer: Medicare Other | Source: Ambulatory Visit | Attending: Orthopedic Surgery | Admitting: Orthopedic Surgery

## 2017-03-14 ENCOUNTER — Encounter (HOSPITAL_COMMUNITY): Payer: Self-pay

## 2017-03-14 DIAGNOSIS — I700 Atherosclerosis of aorta: Secondary | ICD-10-CM | POA: Diagnosis not present

## 2017-03-14 DIAGNOSIS — Z01818 Encounter for other preprocedural examination: Secondary | ICD-10-CM | POA: Diagnosis not present

## 2017-03-14 HISTORY — DX: Personal history of other diseases of the digestive system: Z87.19

## 2017-03-14 LAB — CBC WITH DIFFERENTIAL/PLATELET
BASOS PCT: 1 %
Basophils Absolute: 0 10*3/uL (ref 0.0–0.1)
EOS PCT: 3 %
Eosinophils Absolute: 0.2 10*3/uL (ref 0.0–0.7)
HEMATOCRIT: 44 % (ref 36.0–46.0)
Hemoglobin: 14.7 g/dL (ref 12.0–15.0)
LYMPHS PCT: 36 %
Lymphs Abs: 2.9 10*3/uL (ref 0.7–4.0)
MCH: 30.5 pg (ref 26.0–34.0)
MCHC: 33.4 g/dL (ref 30.0–36.0)
MCV: 91.3 fL (ref 78.0–100.0)
MONO ABS: 0.7 10*3/uL (ref 0.1–1.0)
MONOS PCT: 8 %
NEUTROS ABS: 4.3 10*3/uL (ref 1.7–7.7)
Neutrophils Relative %: 52 %
PLATELETS: 213 10*3/uL (ref 150–400)
RBC: 4.82 MIL/uL (ref 3.87–5.11)
RDW: 12.7 % (ref 11.5–15.5)
WBC: 8.2 10*3/uL (ref 4.0–10.5)

## 2017-03-14 LAB — PROTIME-INR
INR: 1.08
PROTHROMBIN TIME: 13.9 s (ref 11.4–15.2)

## 2017-03-14 LAB — COMPREHENSIVE METABOLIC PANEL
ALT: 25 U/L (ref 14–54)
ANION GAP: 10 (ref 5–15)
AST: 40 U/L (ref 15–41)
Albumin: 3.9 g/dL (ref 3.5–5.0)
Alkaline Phosphatase: 50 U/L (ref 38–126)
BUN: 11 mg/dL (ref 6–20)
CHLORIDE: 105 mmol/L (ref 101–111)
CO2: 24 mmol/L (ref 22–32)
CREATININE: 0.89 mg/dL (ref 0.44–1.00)
Calcium: 9.7 mg/dL (ref 8.9–10.3)
Glucose, Bld: 94 mg/dL (ref 65–99)
POTASSIUM: 4.1 mmol/L (ref 3.5–5.1)
SODIUM: 139 mmol/L (ref 135–145)
Total Bilirubin: 0.7 mg/dL (ref 0.3–1.2)
Total Protein: 7.4 g/dL (ref 6.5–8.1)

## 2017-03-14 LAB — TYPE AND SCREEN
ABO/RH(D): A NEG
Antibody Screen: NEGATIVE

## 2017-03-14 LAB — URINALYSIS, ROUTINE W REFLEX MICROSCOPIC
Bilirubin Urine: NEGATIVE
GLUCOSE, UA: NEGATIVE mg/dL
HGB URINE DIPSTICK: NEGATIVE
KETONES UR: NEGATIVE mg/dL
LEUKOCYTES UA: NEGATIVE
Nitrite: NEGATIVE
PH: 8 (ref 5.0–8.0)
Protein, ur: NEGATIVE mg/dL
Specific Gravity, Urine: 1.013 (ref 1.005–1.030)

## 2017-03-14 LAB — SURGICAL PCR SCREEN
MRSA, PCR: NEGATIVE
Staphylococcus aureus: NEGATIVE

## 2017-03-14 LAB — APTT: aPTT: 30 seconds (ref 24–36)

## 2017-03-14 NOTE — Progress Notes (Signed)
PCP - Dr. Sharlet Salina Cardiologist - patient denies  Chest x-ray - 03/14/2017 EKG - 03/14/2017 Stress Test - patient denies ECHO - patient denies Cardiac Cath - patient denies  Sleep Study - patient denies  Aspirin Instructions: Last dose 03/12/2017  Anesthesia review: n/a  Patient denies shortness of breath, fever, cough and chest pain at PAT appointment   Patient verbalized understanding of instructions that were given to them at the PAT appointment. Patient was also instructed that they will need to review over the PAT instructions again at home before surgery.

## 2017-03-15 LAB — URINE CULTURE

## 2017-03-20 ENCOUNTER — Encounter (HOSPITAL_COMMUNITY): Payer: Self-pay | Admitting: Anesthesiology

## 2017-03-20 MED ORDER — TRANEXAMIC ACID 1000 MG/10ML IV SOLN
2000.0000 mg | INTRAVENOUS | Status: AC
  Administered 2017-03-21: 2000 mg via TOPICAL
  Filled 2017-03-20: qty 20

## 2017-03-20 MED ORDER — CEFAZOLIN SODIUM-DEXTROSE 2-4 GM/100ML-% IV SOLN
2.0000 g | INTRAVENOUS | Status: AC
  Administered 2017-03-21: 2 g via INTRAVENOUS
  Filled 2017-03-20: qty 100

## 2017-03-20 MED ORDER — SODIUM CHLORIDE 0.9 % IV SOLN: INTRAVENOUS | Status: DC

## 2017-03-20 MED ORDER — ACETAMINOPHEN 10 MG/ML IV SOLN
1000.0000 mg | INTRAVENOUS | Status: AC
  Administered 2017-03-21: 1000 mg via INTRAVENOUS
  Filled 2017-03-20: qty 100

## 2017-03-20 NOTE — Anesthesia Preprocedure Evaluation (Addendum)
Anesthesia Evaluation  Patient identified by MRN, date of birth, ID band Patient awake    Reviewed: Allergy & Precautions, NPO status , Patient's Chart, lab work & pertinent test results  History of Anesthesia Complications (+) PONV and history of anesthetic complications  Airway Mallampati: I       Dental no notable dental hx. (+) Teeth Intact   Pulmonary    Pulmonary exam normal breath sounds clear to auscultation       Cardiovascular hypertension, Pt. on home beta blockers and Pt. on medications Normal cardiovascular exam Rhythm:Regular Rate:Normal     Neuro/Psych negative psych ROS   GI/Hepatic   Endo/Other    Renal/GU   negative genitourinary   Musculoskeletal   Abdominal (+) + obese,   Peds  Hematology negative hematology ROS (+)   Anesthesia Other Findings   Reproductive/Obstetrics                            Anesthesia Physical Anesthesia Plan  ASA: II  Anesthesia Plan: Spinal   Post-op Pain Management:  Regional for Post-op pain   Induction:   PONV Risk Score and Plan: Scopolamine patch - Pre-op, Ondansetron, Dexamethasone and Midazolam  Airway Management Planned: Natural Airway, Nasal Cannula and Simple Face Mask  Additional Equipment:   Intra-op Plan:   Post-operative Plan:   Informed Consent: I have reviewed the patients History and Physical, chart, labs and discussed the procedure including the risks, benefits and alternatives for the proposed anesthesia with the patient or authorized representative who has indicated his/her understanding and acceptance.     Plan Discussed with: CRNA and Surgeon  Anesthesia Plan Comments:        Anesthesia Quick Evaluation

## 2017-03-21 ENCOUNTER — Inpatient Hospital Stay (HOSPITAL_COMMUNITY)
Admission: RE | Admit: 2017-03-21 | Discharge: 2017-03-23 | DRG: 470 | Disposition: A | Discharge status: Home Health | Payer: Medicare Other | Source: Ambulatory Visit | Attending: Orthopaedic Surgery | Admitting: Orthopaedic Surgery

## 2017-03-21 ENCOUNTER — Encounter (HOSPITAL_COMMUNITY)
Admission: RE | Disposition: A | Discharge status: Home Health | Payer: Self-pay | Source: Ambulatory Visit | Attending: Orthopaedic Surgery

## 2017-03-21 ENCOUNTER — Inpatient Hospital Stay (HOSPITAL_COMMUNITY): Payer: Medicare Other | Admitting: Anesthesiology

## 2017-03-21 ENCOUNTER — Other Ambulatory Visit: Payer: Self-pay

## 2017-03-21 ENCOUNTER — Encounter (HOSPITAL_COMMUNITY): Payer: Self-pay | Admitting: *Deleted

## 2017-03-21 DIAGNOSIS — Z888 Allergy status to other drugs, medicaments and biological substances status: Secondary | ICD-10-CM | POA: Diagnosis not present

## 2017-03-21 DIAGNOSIS — M1711 Unilateral primary osteoarthritis, right knee: Secondary | ICD-10-CM | POA: Diagnosis not present

## 2017-03-21 DIAGNOSIS — Z9851 Tubal ligation status: Secondary | ICD-10-CM

## 2017-03-21 DIAGNOSIS — M25561 Pain in right knee: Secondary | ICD-10-CM | POA: Diagnosis not present

## 2017-03-21 DIAGNOSIS — E785 Hyperlipidemia, unspecified: Secondary | ICD-10-CM | POA: Diagnosis present

## 2017-03-21 DIAGNOSIS — M4690 Unspecified inflammatory spondylopathy, site unspecified: Secondary | ICD-10-CM | POA: Diagnosis present

## 2017-03-21 DIAGNOSIS — Z90721 Acquired absence of ovaries, unilateral: Secondary | ICD-10-CM | POA: Diagnosis not present

## 2017-03-21 DIAGNOSIS — M549 Dorsalgia, unspecified: Secondary | ICD-10-CM | POA: Diagnosis present

## 2017-03-21 DIAGNOSIS — Z6832 Body mass index (BMI) 32.0-32.9, adult: Secondary | ICD-10-CM

## 2017-03-21 DIAGNOSIS — K5900 Constipation, unspecified: Secondary | ICD-10-CM | POA: Diagnosis present

## 2017-03-21 DIAGNOSIS — Z79899 Other long term (current) drug therapy: Secondary | ICD-10-CM

## 2017-03-21 DIAGNOSIS — Z86010 Personal history of colonic polyps: Secondary | ICD-10-CM

## 2017-03-21 DIAGNOSIS — Z7722 Contact with and (suspected) exposure to environmental tobacco smoke (acute) (chronic): Secondary | ICD-10-CM | POA: Diagnosis present

## 2017-03-21 DIAGNOSIS — Z91018 Allergy to other foods: Secondary | ICD-10-CM

## 2017-03-21 DIAGNOSIS — Z91012 Allergy to eggs: Secondary | ICD-10-CM | POA: Diagnosis not present

## 2017-03-21 DIAGNOSIS — G8918 Other acute postprocedural pain: Secondary | ICD-10-CM | POA: Diagnosis not present

## 2017-03-21 DIAGNOSIS — E669 Obesity, unspecified: Secondary | ICD-10-CM | POA: Diagnosis present

## 2017-03-21 DIAGNOSIS — Z823 Family history of stroke: Secondary | ICD-10-CM

## 2017-03-21 DIAGNOSIS — Z7982 Long term (current) use of aspirin: Secondary | ICD-10-CM | POA: Diagnosis not present

## 2017-03-21 DIAGNOSIS — M7121 Synovial cyst of popliteal space [Baker], right knee: Secondary | ICD-10-CM | POA: Diagnosis present

## 2017-03-21 DIAGNOSIS — M8580 Other specified disorders of bone density and structure, unspecified site: Secondary | ICD-10-CM | POA: Diagnosis present

## 2017-03-21 DIAGNOSIS — I69354 Hemiplegia and hemiparesis following cerebral infarction affecting left non-dominant side: Secondary | ICD-10-CM | POA: Diagnosis not present

## 2017-03-21 DIAGNOSIS — G8929 Other chronic pain: Secondary | ICD-10-CM | POA: Diagnosis present

## 2017-03-21 DIAGNOSIS — K219 Gastro-esophageal reflux disease without esophagitis: Secondary | ICD-10-CM | POA: Diagnosis present

## 2017-03-21 DIAGNOSIS — K529 Noninfective gastroenteritis and colitis, unspecified: Secondary | ICD-10-CM | POA: Diagnosis present

## 2017-03-21 DIAGNOSIS — Z96651 Presence of right artificial knee joint: Secondary | ICD-10-CM

## 2017-03-21 DIAGNOSIS — I10 Essential (primary) hypertension: Secondary | ICD-10-CM | POA: Diagnosis present

## 2017-03-21 DIAGNOSIS — M25761 Osteophyte, right knee: Secondary | ICD-10-CM | POA: Diagnosis present

## 2017-03-21 HISTORY — PX: TOTAL KNEE ARTHROPLASTY: SHX125

## 2017-03-21 SURGERY — ARTHROPLASTY, KNEE, TOTAL
Anesthesia: Spinal | Laterality: Right

## 2017-03-21 MED ORDER — DIPHENHYDRAMINE HCL 50 MG/ML IJ SOLN
INTRAMUSCULAR | Status: DC | PRN
  Administered 2017-03-21: 12.5 mg via INTRAVENOUS

## 2017-03-21 MED ORDER — CHLORHEXIDINE GLUCONATE 4 % EX LIQD: 60.0000 mL | Freq: Once | CUTANEOUS | Status: DC

## 2017-03-21 MED ORDER — B COMPLEX-C PO TABS
1.0000 | ORAL_TABLET | Freq: Every day | ORAL | Status: DC
  Administered 2017-03-21 – 2017-03-23 (×3): 1 via ORAL
  Filled 2017-03-21 (×3): qty 1

## 2017-03-21 MED ORDER — ONDANSETRON HCL 4 MG/2ML IJ SOLN: 4.0000 mg | Freq: Four times a day (QID) | INTRAMUSCULAR | Status: DC | PRN

## 2017-03-21 MED ORDER — PROPOFOL 500 MG/50ML IV EMUL
INTRAVENOUS | Status: DC | PRN
  Administered 2017-03-21: 50 ug/kg/min via INTRAVENOUS

## 2017-03-21 MED ORDER — METOCLOPRAMIDE HCL 5 MG PO TABS: 5.0000 mg | ORAL_TABLET | Freq: Three times a day (TID) | ORAL | Status: DC | PRN

## 2017-03-21 MED ORDER — BUPIVACAINE-EPINEPHRINE (PF) 0.5% -1:200000 IJ SOLN
INTRAMUSCULAR | Status: DC | PRN
  Administered 2017-03-21 (×6): 5 mL via PERINEURAL

## 2017-03-21 MED ORDER — MAGNESIUM 250 MG PO TABS: 250.0000 mg | ORAL_TABLET | Freq: Two times a day (BID) | ORAL | Status: DC

## 2017-03-21 MED ORDER — BUPIVACAINE IN DEXTROSE 0.75-8.25 % IT SOLN
INTRATHECAL | Status: DC | PRN
  Administered 2017-03-21: 1.8 mL via INTRATHECAL

## 2017-03-21 MED ORDER — METHOCARBAMOL 500 MG PO TABS
500.0000 mg | ORAL_TABLET | Freq: Four times a day (QID) | ORAL | Status: DC | PRN
  Administered 2017-03-21 – 2017-03-23 (×5): 500 mg via ORAL
  Filled 2017-03-21 (×5): qty 1

## 2017-03-21 MED ORDER — METOCLOPRAMIDE HCL 5 MG/ML IJ SOLN: 5.0000 mg | Freq: Three times a day (TID) | INTRAMUSCULAR | Status: DC | PRN

## 2017-03-21 MED ORDER — ONDANSETRON HCL 4 MG PO TABS: 4.0000 mg | ORAL_TABLET | Freq: Four times a day (QID) | ORAL | Status: DC | PRN

## 2017-03-21 MED ORDER — ONE-DAILY MULTI VITAMINS PO TABS: 1.0000 | ORAL_TABLET | Freq: Every day | ORAL | Status: DC

## 2017-03-21 MED ORDER — FOLIC ACID 1 MG PO TABS
1.0000 mg | ORAL_TABLET | Freq: Every day | ORAL | Status: DC
  Administered 2017-03-21 – 2017-03-23 (×3): 1 mg via ORAL
  Filled 2017-03-21 (×3): qty 1

## 2017-03-21 MED ORDER — LIDOCAINE HCL (CARDIAC) 20 MG/ML IV SOLN
INTRAVENOUS | Status: DC | PRN
  Administered 2017-03-21: 30 mg via INTRATRACHEAL

## 2017-03-21 MED ORDER — OXYCODONE HCL 5 MG PO TABS: 5.0000 mg | ORAL_TABLET | ORAL | Status: DC | PRN

## 2017-03-21 MED ORDER — HYDROMORPHONE HCL 1 MG/ML IJ SOLN: 0.2500 mg | INTRAMUSCULAR | Status: DC | PRN

## 2017-03-21 MED ORDER — OXYCODONE HCL 5 MG PO TABS
10.0000 mg | ORAL_TABLET | ORAL | Status: DC | PRN
  Administered 2017-03-21 – 2017-03-23 (×8): 10 mg via ORAL
  Filled 2017-03-21 (×8): qty 2

## 2017-03-21 MED ORDER — MIDAZOLAM HCL 5 MG/5ML IJ SOLN
INTRAMUSCULAR | Status: DC | PRN
  Administered 2017-03-21: 2 mg via INTRAVENOUS

## 2017-03-21 MED ORDER — ADULT MULTIVITAMIN W/MINERALS CH
1.0000 | ORAL_TABLET | Freq: Every day | ORAL | Status: DC
  Administered 2017-03-21 – 2017-03-23 (×3): 1 via ORAL
  Filled 2017-03-21 (×3): qty 1

## 2017-03-21 MED ORDER — METHOCARBAMOL 1000 MG/10ML IJ SOLN: 500.0000 mg | Freq: Four times a day (QID) | INTRAVENOUS | Status: DC | PRN

## 2017-03-21 MED ORDER — BUPIVACAINE-EPINEPHRINE (PF) 0.25% -1:200000 IJ SOLN
INTRAMUSCULAR | Status: AC
  Filled 2017-03-21: qty 30

## 2017-03-21 MED ORDER — CALCIUM CITRATE-VITAMIN D 315-200 MG-UNIT PO TABS: 1.0000 | ORAL_TABLET | Freq: Every day | ORAL | Status: DC

## 2017-03-21 MED ORDER — ALUM & MAG HYDROXIDE-SIMETH 200-200-20 MG/5ML PO SUSP: 30.0000 mL | ORAL | Status: DC | PRN

## 2017-03-21 MED ORDER — FENTANYL CITRATE (PF) 250 MCG/5ML IJ SOLN
INTRAMUSCULAR | Status: AC
  Filled 2017-03-21: qty 5

## 2017-03-21 MED ORDER — B COMPLEX PO TABS: 1.0000 | ORAL_TABLET | Freq: Every day | ORAL | Status: DC

## 2017-03-21 MED ORDER — PHENYLEPHRINE HCL 10 MG/ML IJ SOLN
INTRAMUSCULAR | Status: DC | PRN
  Administered 2017-03-21: 80 ug via INTRAVENOUS
  Administered 2017-03-21: 40 ug via INTRAVENOUS

## 2017-03-21 MED ORDER — CHLORTHALIDONE 25 MG PO TABS
25.0000 mg | ORAL_TABLET | Freq: Every day | ORAL | Status: DC
  Administered 2017-03-21 – 2017-03-23 (×3): 25 mg via ORAL
  Filled 2017-03-21 (×5): qty 1

## 2017-03-21 MED ORDER — PHENYLEPHRINE HCL 10 MG/ML IJ SOLN
INTRAVENOUS | Status: DC | PRN
  Administered 2017-03-21: 25 ug/min via INTRAVENOUS

## 2017-03-21 MED ORDER — CHOLECALCIFEROL 125 MCG (5000 UT) PO CAPS: 5000.0000 [IU] | ORAL_CAPSULE | Freq: Every day | ORAL | Status: DC

## 2017-03-21 MED ORDER — POLYETHYLENE GLYCOL 3350 17 G PO PACK: 17.0000 g | PACK | Freq: Every day | ORAL | Status: DC | PRN

## 2017-03-21 MED ORDER — 0.9 % SODIUM CHLORIDE (POUR BTL) OPTIME
TOPICAL | Status: DC | PRN
  Administered 2017-03-21: 1000 mL

## 2017-03-21 MED ORDER — RIVAROXABAN 10 MG PO TABS
10.0000 mg | ORAL_TABLET | Freq: Every day | ORAL | Status: DC
  Administered 2017-03-22 – 2017-03-23 (×2): 10 mg via ORAL
  Filled 2017-03-21 (×2): qty 1

## 2017-03-21 MED ORDER — MAGNESIUM CITRATE PO SOLN: 1.0000 | Freq: Once | ORAL | Status: DC | PRN

## 2017-03-21 MED ORDER — SODIUM CHLORIDE 0.9 % IR SOLN
Status: DC | PRN
  Administered 2017-03-21: 3000 mL

## 2017-03-21 MED ORDER — KETOTIFEN FUMARATE 0.025 % OP SOLN: 2.0000 [drp] | OPHTHALMIC | Status: DC | PRN

## 2017-03-21 MED ORDER — SIMVASTATIN 20 MG PO TABS
20.0000 mg | ORAL_TABLET | Freq: Every day | ORAL | Status: DC
  Administered 2017-03-21 – 2017-03-22 (×2): 20 mg via ORAL
  Filled 2017-03-21 (×2): qty 1

## 2017-03-21 MED ORDER — PROMETHAZINE HCL 25 MG/ML IJ SOLN: 6.2500 mg | INTRAMUSCULAR | Status: DC | PRN

## 2017-03-21 MED ORDER — LISINOPRIL 2.5 MG PO TABS
2.5000 mg | ORAL_TABLET | Freq: Every day | ORAL | Status: DC
  Administered 2017-03-21 – 2017-03-23 (×2): 2.5 mg via ORAL
  Filled 2017-03-21 (×3): qty 1

## 2017-03-21 MED ORDER — VITAMIN B-6 100 MG PO TABS
100.0000 mg | ORAL_TABLET | Freq: Every evening | ORAL | Status: DC
  Administered 2017-03-21 – 2017-03-22 (×2): 100 mg via ORAL
  Filled 2017-03-21 (×3): qty 1

## 2017-03-21 MED ORDER — POTASSIUM CHLORIDE CRYS ER 20 MEQ PO TBCR
20.0000 meq | EXTENDED_RELEASE_TABLET | Freq: Two times a day (BID) | ORAL | Status: DC
  Administered 2017-03-21 – 2017-03-23 (×5): 20 meq via ORAL
  Filled 2017-03-21 (×5): qty 1

## 2017-03-21 MED ORDER — CEFAZOLIN SODIUM-DEXTROSE 2-4 GM/100ML-% IV SOLN
2.0000 g | Freq: Four times a day (QID) | INTRAVENOUS | Status: AC
  Administered 2017-03-21 (×2): 2 g via INTRAVENOUS
  Filled 2017-03-21 (×3): qty 100

## 2017-03-21 MED ORDER — SODIUM CHLORIDE 0.9 % IV SOLN
INTRAVENOUS | Status: DC
  Administered 2017-03-21: 13:00:00 via INTRAVENOUS

## 2017-03-21 MED ORDER — LACTATED RINGERS IV SOLN
INTRAVENOUS | Status: DC | PRN
  Administered 2017-03-21 (×2): via INTRAVENOUS

## 2017-03-21 MED ORDER — EPHEDRINE SULFATE 50 MG/ML IJ SOLN
INTRAMUSCULAR | Status: DC | PRN
  Administered 2017-03-21: 5 mg via INTRAVENOUS

## 2017-03-21 MED ORDER — DEXAMETHASONE SODIUM PHOSPHATE 10 MG/ML IJ SOLN
INTRAMUSCULAR | Status: DC | PRN
  Administered 2017-03-21: 5 mg via INTRAVENOUS

## 2017-03-21 MED ORDER — CALCIUM CARBONATE-VITAMIN D 500-200 MG-UNIT PO TABS
1.0000 | ORAL_TABLET | Freq: Every day | ORAL | Status: DC
  Administered 2017-03-22 – 2017-03-23 (×2): 1 via ORAL
  Filled 2017-03-21 (×2): qty 1

## 2017-03-21 MED ORDER — MENTHOL 3 MG MT LOZG: 1.0000 | LOZENGE | OROMUCOSAL | Status: DC | PRN

## 2017-03-21 MED ORDER — VITAMIN C 500 MG PO TABS
1000.0000 mg | ORAL_TABLET | Freq: Every day | ORAL | Status: DC
  Administered 2017-03-21 – 2017-03-23 (×3): 1000 mg via ORAL
  Filled 2017-03-21 (×5): qty 2

## 2017-03-21 MED ORDER — ACETAMINOPHEN 10 MG/ML IV SOLN
1000.0000 mg | Freq: Four times a day (QID) | INTRAVENOUS | Status: AC
  Administered 2017-03-21 – 2017-03-22 (×4): 1000 mg via INTRAVENOUS
  Filled 2017-03-21 (×4): qty 100

## 2017-03-21 MED ORDER — DOCUSATE SODIUM 100 MG PO CAPS
100.0000 mg | ORAL_CAPSULE | Freq: Two times a day (BID) | ORAL | Status: DC
  Administered 2017-03-21 – 2017-03-23 (×4): 100 mg via ORAL
  Filled 2017-03-21 (×5): qty 1

## 2017-03-21 MED ORDER — BISACODYL 10 MG RE SUPP: 10.0000 mg | Freq: Every day | RECTAL | Status: DC | PRN

## 2017-03-21 MED ORDER — PHENOL 1.4 % MT LIQD: 1.0000 | OROMUCOSAL | Status: DC | PRN

## 2017-03-21 MED ORDER — MIDAZOLAM HCL 2 MG/2ML IJ SOLN
INTRAMUSCULAR | Status: AC
  Filled 2017-03-21: qty 2

## 2017-03-21 MED ORDER — PROPOFOL 10 MG/ML IV BOLUS
INTRAVENOUS | Status: AC
  Filled 2017-03-21: qty 20

## 2017-03-21 MED ORDER — BUPIVACAINE-EPINEPHRINE 0.25% -1:200000 IJ SOLN
INTRAMUSCULAR | Status: DC | PRN
  Administered 2017-03-21: 30 mL

## 2017-03-21 MED ORDER — HYDROMORPHONE HCL 1 MG/ML IJ SOLN
0.5000 mg | INTRAMUSCULAR | Status: DC | PRN
  Administered 2017-03-21 – 2017-03-23 (×5): 0.5 mg via INTRAVENOUS
  Filled 2017-03-21 (×5): qty 1

## 2017-03-21 MED ORDER — MAGNESIUM GLUCONATE 500 MG PO TABS
250.0000 mg | ORAL_TABLET | Freq: Two times a day (BID) | ORAL | Status: DC
  Administered 2017-03-21 – 2017-03-23 (×5): 250 mg via ORAL
  Filled 2017-03-21 (×5): qty 1

## 2017-03-21 MED ORDER — DIPHENOXYLATE-ATROPINE 2.5-0.025 MG PO TABS: 1.0000 | ORAL_TABLET | Freq: Three times a day (TID) | ORAL | Status: DC | PRN

## 2017-03-21 MED ORDER — DIPHENHYDRAMINE HCL 12.5 MG/5ML PO ELIX: 12.5000 mg | ORAL_SOLUTION | ORAL | Status: DC | PRN

## 2017-03-21 MED ORDER — MEPERIDINE HCL 25 MG/ML IJ SOLN: 6.2500 mg | INTRAMUSCULAR | Status: DC | PRN

## 2017-03-21 MED ORDER — ONDANSETRON HCL 4 MG/2ML IJ SOLN
INTRAMUSCULAR | Status: DC | PRN
  Administered 2017-03-21: 4 mg via INTRAVENOUS

## 2017-03-21 MED ORDER — FENTANYL CITRATE (PF) 100 MCG/2ML IJ SOLN
INTRAMUSCULAR | Status: DC | PRN
  Administered 2017-03-21: 75 ug via INTRAVENOUS

## 2017-03-21 MED ORDER — VITAMIN D 1000 UNITS PO TABS
5000.0000 [IU] | ORAL_TABLET | Freq: Every day | ORAL | Status: DC
  Administered 2017-03-21 – 2017-03-23 (×3): 5000 [IU] via ORAL
  Filled 2017-03-21 (×4): qty 5

## 2017-03-21 MED ORDER — FOLIC ACID 800 MCG PO TABS: 800.0000 ug | ORAL_TABLET | Freq: Every evening | ORAL | Status: DC

## 2017-03-21 MED ORDER — ATENOLOL-CHLORTHALIDONE 50-25 MG PO TABS: 1.0000 | ORAL_TABLET | Freq: Every day | ORAL | Status: DC

## 2017-03-21 MED ORDER — MELATONIN 3 MG PO TABS
3.0000 mg | ORAL_TABLET | Freq: Every day | ORAL | Status: DC
  Administered 2017-03-21 – 2017-03-22 (×2): 3 mg via ORAL
  Filled 2017-03-21 (×2): qty 1

## 2017-03-21 MED ORDER — ATENOLOL 50 MG PO TABS
50.0000 mg | ORAL_TABLET | Freq: Every day | ORAL | Status: DC
  Administered 2017-03-21 – 2017-03-23 (×3): 50 mg via ORAL
  Filled 2017-03-21 (×3): qty 1

## 2017-03-21 SURGICAL SUPPLY — 60 items
BAG DECANTER FOR FLEXI CONT (MISCELLANEOUS) ×3 IMPLANT
BANDAGE ESMARK 6X9 LF (GAUZE/BANDAGES/DRESSINGS) ×1 IMPLANT
BLADE SAGITTAL 25.0X1.19X90 (BLADE) ×2 IMPLANT
BLADE SAGITTAL 25.0X1.19X90MM (BLADE) ×1
BNDG CMPR 9X6 STRL LF SNTH (GAUZE/BANDAGES/DRESSINGS) ×1
BNDG ESMARK 6X9 LF (GAUZE/BANDAGES/DRESSINGS) ×3
BOWL SMART MIX CTS (DISPOSABLE) ×3 IMPLANT
CAP KNEE TOTAL 3 SIGMA ×3 IMPLANT
CEMENT HV SMART SET (Cement) ×6 IMPLANT
COVER SURGICAL LIGHT HANDLE (MISCELLANEOUS) ×3 IMPLANT
CUFF TOURNIQUET SINGLE 34IN LL (TOURNIQUET CUFF) ×3 IMPLANT
CUFF TOURNIQUET SINGLE 44IN (TOURNIQUET CUFF) IMPLANT
DECANTER SPIKE VIAL GLASS SM (MISCELLANEOUS) ×3 IMPLANT
DRAPE EXTREMITY T 121X128X90 (DRAPE) ×3 IMPLANT
DRAPE HALF SHEET 40X57 (DRAPES) ×6 IMPLANT
DRSG ADAPTIC 3X8 NADH LF (GAUZE/BANDAGES/DRESSINGS) ×3 IMPLANT
DRSG PAD ABDOMINAL 8X10 ST (GAUZE/BANDAGES/DRESSINGS) ×4 IMPLANT
DURAPREP 26ML APPLICATOR (WOUND CARE) ×6 IMPLANT
ELECT CAUTERY BLADE 6.4 (BLADE) ×3 IMPLANT
ELECT REM PT RETURN 9FT ADLT (ELECTROSURGICAL) ×3
ELECTRODE REM PT RTRN 9FT ADLT (ELECTROSURGICAL) ×1 IMPLANT
EVACUATOR 1/8 PVC DRAIN (DRAIN) IMPLANT
FACESHIELD WRAPAROUND (MASK) ×6 IMPLANT
FACESHIELD WRAPAROUND OR TEAM (MASK) ×2 IMPLANT
GAUZE SPONGE 4X4 12PLY STRL (GAUZE/BANDAGES/DRESSINGS) ×3 IMPLANT
GLOVE BIOGEL PI IND STRL 8 (GLOVE) ×1 IMPLANT
GLOVE BIOGEL PI IND STRL 8.5 (GLOVE) ×1 IMPLANT
GLOVE BIOGEL PI INDICATOR 8 (GLOVE) ×2
GLOVE BIOGEL PI INDICATOR 8.5 (GLOVE) ×2
GLOVE ECLIPSE 8.0 STRL XLNG CF (GLOVE) ×6 IMPLANT
GLOVE ECLIPSE 8.5 STRL (GLOVE) ×6 IMPLANT
GOWN STRL REUS W/ TWL LRG LVL3 (GOWN DISPOSABLE) ×2 IMPLANT
GOWN STRL REUS W/TWL 2XL LVL3 (GOWN DISPOSABLE) ×3 IMPLANT
GOWN STRL REUS W/TWL LRG LVL3 (GOWN DISPOSABLE) ×6
HANDPIECE INTERPULSE COAX TIP (DISPOSABLE) ×3
KIT BASIN OR (CUSTOM PROCEDURE TRAY) ×3 IMPLANT
KIT ROOM TURNOVER OR (KITS) ×3 IMPLANT
MANIFOLD NEPTUNE II (INSTRUMENTS) ×3 IMPLANT
NEEDLE 22X1 1/2 (OR ONLY) (NEEDLE) ×3 IMPLANT
NS IRRIG 1000ML POUR BTL (IV SOLUTION) ×3 IMPLANT
PACK TOTAL JOINT (CUSTOM PROCEDURE TRAY) ×3 IMPLANT
PAD ARMBOARD 7.5X6 YLW CONV (MISCELLANEOUS) ×6 IMPLANT
PAD CAST 4YDX4 CTTN HI CHSV (CAST SUPPLIES) ×1 IMPLANT
PADDING CAST COTTON 4X4 STRL (CAST SUPPLIES) ×3
PADDING CAST COTTON 6X4 STRL (CAST SUPPLIES) ×3 IMPLANT
SET HNDPC FAN SPRY TIP SCT (DISPOSABLE) ×1 IMPLANT
STAPLER VISISTAT 35W (STAPLE) ×3 IMPLANT
SUCTION FRAZIER HANDLE 10FR (MISCELLANEOUS) ×2
SUCTION TUBE FRAZIER 10FR DISP (MISCELLANEOUS) ×1 IMPLANT
SURGIFLO W/THROMBIN 8M KIT (HEMOSTASIS) IMPLANT
SUT BONE WAX W31G (SUTURE) ×3 IMPLANT
SUT ETHIBOND NAB CT1 #1 30IN (SUTURE) ×6 IMPLANT
SUT MNCRL AB 3-0 PS2 18 (SUTURE) ×3 IMPLANT
SUT VIC AB 0 CT1 27 (SUTURE) ×3
SUT VIC AB 0 CT1 27XBRD ANBCTR (SUTURE) ×1 IMPLANT
SYR CONTROL 10ML LL (SYRINGE) ×2 IMPLANT
TOWEL OR 17X24 6PK STRL BLUE (TOWEL DISPOSABLE) ×3 IMPLANT
TOWEL OR 17X26 10 PK STRL BLUE (TOWEL DISPOSABLE) ×3 IMPLANT
TRAY FOLEY CATH SILVER 16FR (SET/KITS/TRAYS/PACK) ×2 IMPLANT
WRAP KNEE MAXI GEL POST OP (GAUZE/BANDAGES/DRESSINGS) ×3 IMPLANT

## 2017-03-21 NOTE — Evaluation (Signed)
Physical Therapy Evaluation Patient Details Name: [name] MRN: [id number] DOB: 23-Jan-1951 Today's Date: 03/21/2017   History of Present Illness  Pt is a 66 y/o female s/p elective R TKA. PMH includes HTN, CVA, and osteopenia.   Clinical Impression  Pt is s/p surgery above with deficits below. PTA, pt was independent with functional mobility. Upon eval, pt presenting with post op pain and weakness,  decreased sensation in RLE, decreased balance, and R knee buckling. Pt requiring min to mod A for gait and distance limited to chair secondary to decreased balance. Pt with R knee buckling which caused instability. Required cues for safe use of RW as well. Anticipate pt will progress well once sensation is better in RLE. Reports husband will be able to assist as needed upon d/c and will need DME below. Follow up recommendations per MD arrangements. Will continue to follow acutely to maximize functional mobility independence and safety.     Follow Up Recommendations DC plan and follow up therapy as arranged by surgeon;Supervision for mobility/OOB    Equipment Recommendations  Rolling walker with 5" wheels;3in1 (PT)(youth RW )    Recommendations for Other Services       Precautions / Restrictions Precautions Precautions: Knee Precaution Booklet Issued: Yes (comment) Precaution Comments: Reviewed supine ther ex with pt  Restrictions Weight Bearing Restrictions: Yes RLE Weight Bearing: Partial weight bearing RLE Partial Weight Bearing Percentage or Pounds: 50      Mobility  Bed Mobility Overal bed mobility: Needs Assistance Bed Mobility: Supine to Sit     Supine to sit: Supervision     General bed mobility comments: Supervision for safety.   Transfers Overall transfer level: Needs assistance Equipment used: Rolling walker (2 wheeled) Transfers: Sit to/from Stand Sit to Stand: Mod assist         General transfer comment: Mod A for lift assist and steadying. Some R  knee buckling that required mod A for steadying. Verbal cues for upright posture upon standing.   Ambulation/Gait Ambulation/Gait assistance: Mod assist;Min assist Ambulation Distance (Feet): 3 Feet Assistive device: Rolling walker (2 wheeled) Gait Pattern/deviations: Step-to pattern;Decreased step length - right;Decreased step length - left;Decreased weight shift to right;Antalgic Gait velocity: Decreased Gait velocity interpretation: Below normal speed for age/gender General Gait Details: Slow, antalgic gait. Very unsteady as pt having R knee buckling, so distance limited to chair. Min to mod A for steadying. Pt with anterior trunk lean and required cues for upright posture throughout. Verbal cues for sequencing using RW and to press through UEs to maintain 50% WB on RLE.   Stairs            Wheelchair Mobility    Modified Rankin (Stroke Patients Only)       Balance Overall balance assessment: Needs assistance Sitting-balance support: No upper extremity supported;Feet supported Sitting balance-[name] Scale: Good     Standing balance support: Bilateral upper extremity supported;During functional activity Standing balance-[name] Scale: Poor Standing balance comment: Reliant on UE support                              Pertinent Vitals/Pain Pain Assessment: 0-10 Pain Score: 7  Pain Location: R knee  Pain Descriptors / Indicators: Aching;Operative site guarding Pain Intervention(s): Limited activity within patient's tolerance;Monitored during session;Repositioned    Home Living Family/patient expects to be discharged to:: Private residence Living Arrangements: Spouse/significant other Available Help at Discharge: Family;Available 24 hours/day Type of Home: [place]  Access: Stairs to enter Entrance Stairs-Rails: None Entrance Stairs-Number of Steps: 3 Home Layout: One level Home Equipment: None      Prior Function Level of Independence: Independent                Hand Dominance   Dominant Hand: Right    Extremity/Trunk Assessment   Upper Extremity Assessment Upper Extremity Assessment: Defer to OT evaluation    Lower Extremity Assessment Lower Extremity Assessment: RLE deficits/detail RLE Deficits / Details: Decreased sensation from the knee to feet. Deficits consistent with post op pain and weakness. Able to perform ther ex below.     Cervical / Trunk Assessment Cervical / Trunk Assessment: Normal  Communication   Communication: No difficulties  Cognition Arousal/Alertness: Awake/alert Behavior During Therapy: WFL for tasks assessed/performed Overall Cognitive Status: Within Functional Limits for tasks assessed                                        General Comments      Exercises Total Joint Exercises Ankle Circles/Pumps: AROM;Both;20 reps Quad Sets: AROM;Right;10 reps Towel Squeeze: AROM;Both;10 reps Short Arc Quad: AROM;Right;10 reps Heel Slides: AROM;Right;10 reps Hip ABduction/ADduction: AROM;Right;10 reps   Assessment/Plan    PT Assessment Patient needs continued PT services  PT Problem List Decreased strength;Decreased range of motion;Decreased balance;Decreased activity tolerance;Decreased mobility;Decreased coordination;Decreased knowledge of use of DME;Decreased knowledge of precautions;Pain;Impaired sensation       PT Treatment Interventions DME instruction;Gait training;Stair training;Functional mobility training;Therapeutic activities;Therapeutic exercise;Balance training;Neuromuscular re-education;Patient/family education    PT Goals (Current goals can be found in the Care Plan section)  Acute Rehab PT Goals Patient Stated Goal: to get better  PT Goal Formulation: With patient Time For Goal Achievement: 03/28/17 Potential to Achieve Goals: Good    Frequency 7X/week   Barriers to discharge        Co-evaluation               AM-PAC PT "6 Clicks" Daily Activity   Outcome Measure Difficulty turning over in bed (including adjusting bedclothes, sheets and blankets)?: A Little Difficulty moving from lying on back to sitting on the side of the bed? : Unable Difficulty sitting down on and standing up from a chair with arms (e.g., wheelchair, bedside commode, etc,.)?: Unable Help needed moving to and from a bed to chair (including a wheelchair)?: A Lot Help needed walking in hospital room?: A Lot Help needed climbing 3-5 steps with a railing? : A Lot 6 Click Score: 11    End of Session Equipment Utilized During Treatment: Gait belt Activity Tolerance: Patient tolerated treatment well Patient left: in chair;with call bell/phone within reach Nurse Communication: Mobility status PT Visit Diagnosis: Unsteadiness on feet (R26.81);Other abnormalities of gait and mobility (R26.89);Pain Pain - Right/Left: Right Pain - part of body: Knee    Time: 1359-1426 PT Time Calculation (min) (ACUTE ONLY): 27 min   Charges:   PT Evaluation $PT Eval Low Complexity: 1 Low PT Treatments $Therapeutic Exercise: 8-22 mins   PT G Codes:        Leighton Ruff, PT, DPT  Acute Rehabilitation Services  Pager: [phone]   Rudean Hitt 03/21/2017, 2:32 PM

## 2017-03-21 NOTE — Progress Notes (Signed)
Orthopedic Tech Progress Note Patient Details:  [name] 07-19-1950 [id number]  Ortho Devices Ortho Device/Splint Interventions: Application   Post Interventions Patient Tolerated: Well   [place] Pink 03/21/2017, 10:14 AM

## 2017-03-21 NOTE — H&P (Signed)
The recent History & Physical has been reviewed. I have personally examined the patient today. There is no interval change to the documented History & Physical. The patient would like to proceed with the procedure.  [name] 03/21/2017,  7:05 AM

## 2017-03-21 NOTE — Discharge Instructions (Signed)
Information on my medicine - XARELTO (Rivaroxaban)  This medication education was reviewed with me or my healthcare representative as part of my discharge preparation.  Why was Xarelto prescribed for you? Xarelto was prescribed for you to reduce the risk of blood clots forming after orthopedic surgery. The medical term for these abnormal blood clots is venous thromboembolism (VTE).  What do you need to know about xarelto ? Take your Xarelto ONCE DAILY at the same time every day. You may take it either with or without food.  If you have difficulty swallowing the tablet whole, you may crush it and mix in applesauce just prior to taking your dose.  Take Xarelto exactly as prescribed by your doctor and DO NOT stop taking Xarelto without talking to the doctor who prescribed the medication.  Stopping without other VTE prevention medication to take the place of Xarelto may increase your risk of developing a clot.  After discharge, you should have regular check-up appointments with your healthcare provider that is prescribing your Xarelto.    What do you do if you miss a dose? If you miss a dose, take it as soon as you remember on the same day then continue your regularly scheduled once daily regimen the next day. Do not take two doses of Xarelto on the same day.   Important Safety Information A possible side effect of Xarelto is bleeding. You should call your healthcare provider right away if you experience any of the following: ? Bleeding from an injury or your nose that does not stop. ? Unusual colored urine (red or dark brown) or unusual colored stools (red or black). ? Unusual bruising for unknown reasons. ? A serious fall or if you hit your head (even if there is no bleeding).  Some medicines may interact with Xarelto and might increase your risk of bleeding while on Xarelto. To help avoid this, consult your healthcare provider or pharmacist prior to using any new prescription  or non-prescription medications, including herbals, vitamins, non-steroidal anti-inflammatory drugs (NSAIDs) and supplements.  This website has more information on Xarelto: [email or web address]-benson.[email or web address]

## 2017-03-21 NOTE — Anesthesia Procedure Notes (Addendum)
Spinal  Start time: 03/21/2017 7:21 AM End time: 03/21/2017 7:24 AM Staffing Anesthesiologist: Lyn Hollingshead, MD Performed: anesthesiologist  Spinal Block Patient position: sitting Prep: site prepped and draped and DuraPrep Patient monitoring: continuous pulse ox and blood pressure Approach: midline Location: L3-4 Injection technique: single-shot Needle Needle type: Pencan  Needle gauge: 24 G Needle length: 10 cm Needle insertion depth: 5 cm Assessment Sensory level: T8

## 2017-03-21 NOTE — Anesthesia Postprocedure Evaluation (Signed)
Anesthesia Post Note  Patient: [name]  Procedure(s) Performed: RIGHT TOTAL KNEE ARTHROPLASTY (Right )     Patient location during evaluation: PACU Anesthesia Type: Spinal Level of consciousness: awake Pain management: pain level controlled Vital Signs Assessment: post-procedure vital signs reviewed and stable Respiratory status: spontaneous breathing Cardiovascular status: stable Postop Assessment: spinal receding, no headache, no backache, no apparent nausea or vomiting and patient able to bend at knees Anesthetic complications: no    Last Vitals:  Vitals:   03/21/17 1006 03/21/17 1021  BP: 101/66 105/68  Pulse: (!) 50 (!) 48  Resp: 18 11  Temp:    SpO2: 100% 100%    Last Pain: There were no vitals filed for this visit. Pain Goal: Patients Stated Pain Goal: 4 (03/21/17 0559)               [place]

## 2017-03-21 NOTE — Transfer of Care (Signed)
Immediate Anesthesia Transfer of Care Note  Patient: [name]  Procedure(s) Performed: RIGHT TOTAL KNEE ARTHROPLASTY (Right )  Patient Location: PACU  Anesthesia Type:Regional and Spinal  Level of Consciousness: awake, alert  and oriented  Airway & Oxygen Therapy: Patient Spontanous Breathing and Patient connected to nasal cannula oxygen  Post-op Assessment: Report given to RN and Post -op Vital signs reviewed and stable  Post vital signs: Reviewed and stable  Last Vitals:  Vitals:   03/21/17 0547 03/21/17 0934  BP: 128/65   Pulse: 66   Resp: 18   Temp: 36.7 C (!) (P) 36.3 C  SpO2: 95%     Last Pain: There were no vitals filed for this visit.    Patients Stated Pain Goal: 4 (37/09/64 3838)  Complications: No apparent anesthesia complications

## 2017-03-21 NOTE — Anesthesia Procedure Notes (Addendum)
Anesthesia Regional Block: Adductor canal block   Pre-Anesthetic Checklist: ,, timeout performed, Correct Patient, Correct Site, Correct Laterality, Correct Procedure, Correct Position, site marked, Risks and benefits discussed,  Surgical consent,  Pre-op evaluation,  At surgeon's request and post-op pain management  Laterality: Right and Lower  Prep: chloraprep       Needles:  Injection technique: Single-shot  Needle Type: Echogenic Stimulator Needle     Needle Length: 9cm  Needle Gauge: 21   Needle insertion depth: 4 cm   Additional Needles:   Procedures:,,,, ultrasound used (permanent image in chart),,,,  Narrative:  Start time: 03/21/2017 7:00 AM End time: 03/21/2017 7:08 AM Injection made incrementally with aspirations every 5 mL.  Performed by: Personally  Anesthesiologist: Lyn Hollingshead, MD

## 2017-03-21 NOTE — Op Note (Signed)
PATIENT ID:      [name]  MRN:     [id number] DOB/AGE:    66/28/1952 / 66 y.o.       OPERATIVE REPORT    DATE OF PROCEDURE:  03/21/2017       PREOPERATIVE DIAGNOSIS: End Stage  Osteoarthritis Right Knee                                                       Estimated body mass index is 32.01 kg/m as calculated from the following:   Height as of 03/14/17: 5' 2"  (1.575 m).   Weight as of this encounter: 175 lb (79.4 kg).     POSTOPERATIVE DIAGNOSIS:   Osteoarthritis Right Knee                                                                     Estimated body mass index is 32.01 kg/m as calculated from the following:   Height as of 03/14/17: 5' 2"  (1.575 m).   Weight as of this encounter: 175 lb (79.4 kg).     PROCEDURE:  Procedure(s): RIGHT TOTAL KNEE ARTHROPLASTY     SURGEON:  Joni Fears, MD    ASSISTANT:   Biagio Borg, PA-C   (Present and scrubbed throughout the case, critical for assistance with exposure, retraction, instrumentation, and closure.)          ANESTHESIA: regional, spinal and IV sedation     DRAINS: none :      TOURNIQUET TIME:  Total Tourniquet Time Documented: Thigh (Right) - 72 minutes Total: Thigh (Right) - 72 minutes     COMPLICATIONS:  None   CONDITION:  stable  PROCEDURE IN DETAIL:      [name] 03/21/2017, 9:10 AM

## 2017-03-22 ENCOUNTER — Encounter (HOSPITAL_COMMUNITY): Payer: Self-pay | Admitting: Orthopaedic Surgery

## 2017-03-22 LAB — BASIC METABOLIC PANEL
Anion gap: 9 (ref 5–15)
BUN: 14 mg/dL (ref 6–20)
CHLORIDE: 105 mmol/L (ref 101–111)
CO2: 25 mmol/L (ref 22–32)
CREATININE: 0.94 mg/dL (ref 0.44–1.00)
Calcium: 9.2 mg/dL (ref 8.9–10.3)
GFR calc non Af Amer: >60 mL/min (ref >60)
Glucose, Bld: 146 mg/dL — ABNORMAL HIGH (ref 65–99)
POTASSIUM: 4 mmol/L (ref 3.5–5.1)
SODIUM: 139 mmol/L (ref 135–145)

## 2017-03-22 LAB — CBC
HCT: 38.1 % (ref 36.0–46.0)
HEMOGLOBIN: 12.3 g/dL (ref 12.0–15.0)
MCH: 29.7 pg (ref 26.0–34.0)
MCHC: 32.3 g/dL (ref 30.0–36.0)
MCV: 92 fL (ref 78.0–100.0)
Platelets: 178 10*3/uL (ref 150–400)
RBC: 4.14 MIL/uL (ref 3.87–5.11)
RDW: 12.9 % (ref 11.5–15.5)
WBC: 15.6 10*3/uL — AB (ref 4.0–10.5)

## 2017-03-22 NOTE — Evaluation (Signed)
Occupational Therapy Evaluation Patient Details Name: [name] MRN: [id number] DOB: [birth year] Today's Date: 03/22/2017    History of Present Illness Pt is a 66 y/o female s/p elective R TKA. PMH includes HTN, CVA, and osteopenia.    Clinical Impression   This 66 y/o F presents with the above. At baseline Pt is independent with ADLs and functional mobility. Pt completed functional mobility, toileting, simulated shower transfer at overall MinGuard assist with Min verbal cues for maintaining PWB in RLE; currently requires MinA for LB ADLs secondary to RLE functional limitations. Pt will return home with 24hr available assist from spouse/family for ADLs PRN. Education provided and questions answered throughout. Feel Pt will safely progress home with family assist. No further acute OT needs identified at this time. Will sign off.     Follow Up Recommendations  DC plan and follow up therapy as arranged by surgeon;Supervision/Assistance - 24 hour    Equipment Recommendations  3 in 1 bedside commode           Precautions / Restrictions Precautions Precautions: Knee Precaution Comments: Reviewed positioning with pt  Restrictions Weight Bearing Restrictions: Yes RLE Weight Bearing: Partial weight bearing RLE Partial Weight Bearing Percentage or Pounds: 50      Mobility Bed Mobility Overal bed mobility: Needs Assistance Bed Mobility: Supine to Sit;Sit to Supine     Supine to sit: Supervision Sit to supine: Supervision   General bed mobility comments: Supervision for safety.   Transfers Overall transfer level: Needs assistance Equipment used: Rolling walker (2 wheeled) Transfers: Sit to/from Stand Sit to Stand: Min guard         General transfer comment: cues on technique and to maintain 50% PWB     Balance Overall balance assessment: Needs assistance Sitting-balance support: Bilateral upper extremity supported;Feet unsupported Sitting balance-[name] Scale: Good     Standing balance support: Bilateral upper extremity supported;During functional activity Standing balance-[name] Scale: Fair Standing balance comment: dependent on UEs for balance and to maintain PWB status; able to briefly maintain static standing without UE support                             ADL either performed or assessed with clinical judgement   ADL Overall ADL's : Needs assistance/impaired Eating/Feeding: Set up;Sitting   Grooming: Wash/dry hands;Min guard;Standing   Upper Body Bathing: Min guard;Sitting   Lower Body Bathing: Minimal assistance;Sit to/from stand   Upper Body Dressing : Sitting;Set up   Lower Body Dressing: Minimal assistance;Sit to/from stand Lower Body Dressing Details (indicate cue type and reason): educated on compensatory techniques/safety during task completion  Toilet Transfer: Min guard;Ambulation;Comfort height toilet;Grab bars;RW   Toileting- Water quality scientist and Hygiene: Min guard;Sit to/from stand   Tub/ Shower Transfer: Walk-in shower;Min guard;Ambulation;Rolling walker;3 in 1;Cueing for sequencing Tub/Shower Transfer Details (indicate cue type and reason): Pt demonstrates good carryover of transfer technique with min verbal cues for sequencing; able to maintain PWB during transfer with min verbal cues  Functional mobility during ADLs: Min guard;Rolling walker General ADL Comments: Pt completed room level functional mobility, toileting, standing grooming ADLs and simulated shower transfer with overall MinGuard-MinA, min verbal cues for maintaining PWB with Pt demonstrating good carryover/understanding                          Pertinent Vitals/Pain Pain Assessment: Faces Pain Score: 4  Faces Pain Scale: Hurts little more Pain Location: R  knee  Pain Descriptors / Indicators: Aching;Operative site guarding Pain Intervention(s): Limited activity within patient's tolerance;Monitored during session;Ice applied     Hand  Dominance Right   Extremity/Trunk Assessment Upper Extremity Assessment Upper Extremity Assessment: RUE deficits/detail RUE Deficits / Details: Pt with baseline deficits due to previous shoulder surgery/frozen shoulder, does not appear to impact ADL task completion or functional mobility  RUE: Unable to fully assess due to pain   Lower Extremity Assessment Lower Extremity Assessment: Defer to PT evaluation   Cervical / Trunk Assessment Cervical / Trunk Assessment: Normal   Communication Communication Communication: No difficulties   Cognition Arousal/Alertness: Awake/alert Behavior During Therapy: WFL for tasks assessed/performed Overall Cognitive Status: Within Functional Limits for tasks assessed                                                     Home Living Family/patient expects to be discharged to:: Private residence Living Arrangements: Spouse/significant other Available Help at Discharge: Family;Available 24 hours/day Type of Home: House Home Access: Stairs to enter [employer] of Steps: 3 Entrance Stairs-Rails: None Home Layout: One level     Bathroom Shower/Tub: Occupational psychologist: Handicapped height     Home Equipment: None          Prior Functioning/Environment Level of Independence: Independent                 OT Problem List: Decreased strength;Decreased range of motion;Decreased activity tolerance;Decreased knowledge of use of DME or AE;Decreased knowledge of precautions            OT Goals(Current goals can be found in the care plan section) Acute Rehab OT Goals Patient Stated Goal: to get better  OT Goal Formulation: All assessment and education complete, DC therapy                                 AM-PAC PT "6 Clicks" Daily Activity     Outcome Measure Help from another person eating meals?: None Help from another person taking care of personal grooming?: A Little Help from  another person toileting, which includes using toliet, bedpan, or urinal?: A Little Help from another person bathing (including washing, rinsing, drying)?: A Little Help from another person to put on and taking off regular upper body clothing?: None Help from another person to put on and taking off regular lower body clothing?: A Little 6 Click Score: 20   End of Session Equipment Utilized During Treatment: Gait belt;Rolling walker CPM Right Knee CPM Right Knee: On Right Knee Flexion (Degrees): 90 Nurse Communication: Mobility status  Activity Tolerance: Patient tolerated treatment well Patient left: in bed;with call bell/phone within reach;in CPM;with SCD's reapplied  OT Visit Diagnosis: Other abnormalities of gait and mobility (R26.89);Pain Pain - Right/Left: Right Pain - part of body: Knee                Time: 3220-2542 OT Time Calculation (min): 33 min Charges:  OT General Charges $OT Visit: 1 Visit OT Evaluation $OT Eval Low Complexity: 1 Low OT Treatments $Self Care/Home Management : 8-22 mins G-Codes:     Lou Cal, OT Pager [phone] 03/22/2017   Raymondo Band 03/22/2017, 4:36 PM

## 2017-03-22 NOTE — Progress Notes (Signed)
PATIENT ID: [name]        MRN:  [id number]          DOB/AGE: July 20, 1950 / 66 y.o.    Stacy Fears, MD   Stacy Borg, PA-C [address] [place], [place]  [address]                             [phone]   PROGRESS NOTE  Subjective:  negative for Chest Pain  negative for Shortness of Breath  negative for Nausea/Vomiting   negative for Calf Pain    Tolerating Diet: yes         Patient reports pain as moderate.     Resting comfortably  Objective: Vital signs in last 24 hours:    Patient Vitals for the past 24 hrs:  BP Temp Temp src Pulse Resp SpO2  03/22/17 0546 (!) 91/53 97.6 F (36.4 C) Oral 63 18 94 %  03/21/17 2027 (!) 109/57 (!) 97.5 F (36.4 C) Oral 65 16 93 %  03/21/17 1145 111/66 97.7 F (36.5 C) Oral 62 18 99 %  03/21/17 1115 - (!) 97 F (36.1 C) - (!) 53 19 99 %  03/21/17 1106 105/67 - - (!) 53 15 99 %  03/21/17 1100 - - - (!) 55 12 98 %  03/21/17 1051 110/66 - - (!) 51 12 99 %  03/21/17 1036 105/65 - - (!) 50 12 100 %  03/21/17 1030 - - - (!) 58 14 100 %  03/21/17 1021 105/68 - - (!) 48 11 100 %  03/21/17 1006 101/66 - - (!) 50 18 100 %  03/21/17 1000 - - - (!) 56 13 100 %  03/21/17 0951 104/64 - - 72 17 100 %  03/21/17 0936 103/67 - - - - -  03/21/17 0934 - (!) 97.4 F (36.3 C) - 72 10 100 %      Intake/Output from previous day:   12/11 0701 - 12/12 0700 In: 3691.3 [P.O.:600; I.V.:2691.3] Out: 4700 [Urine:4400]   Intake/Output this shift:   No intake/output data recorded.   Intake/Output      12/11 0701 - 12/12 0700 12/12 0701 - 12/13 0700   P.O. 600    I.V. (mL/kg) 2691.3 (33.9)    IV Piggyback 400    Total Intake(mL/kg) 3691.3 (46.5)    Urine (mL/kg/hr) 4400 (2.3)    Blood 300    Total Output 4700    Net -1008.8            LABORATORY DATA: Recent Labs    03/22/17 0531  WBC 15.6*  HGB 12.3  HCT 38.1  PLT 178   Recent Labs    03/22/17 0531  NA 139  K 4.0  CL 105  CO2 25  BUN 14  CREATININE 0.94  GLUCOSE  146*  CALCIUM 9.2   Lab Results  Component Value Date   INR 1.08 03/14/2017   INR 1.10 09/10/2014    Recent Radiographic Studies :  Dg Chest 2 View  Result Date: 03/14/2017 CLINICAL DATA:  Preoperative examination. Patient for knee replacement. EXAM: CHEST  2 VIEW COMPARISON:  PA and lateral chest 11/16/2016 and 11/25/2013. FINDINGS: The lungs are clear. Heart size is normal. No pneumothorax or pleural effusion. Aortic atherosclerosis noted. No acute bony abnormality. IMPRESSION: No acute disease. Electronically Signed   By: Inge Rise M.D.   On: 03/14/2017 15:37     Examination:  General appearance: alert, cooperative and no distress  Wound Exam: clean, dry, intact   Drainage:  None: wound tissue dry  Motor Exam: EHL, FHL, Anterior Tibial and Posterior Tibial Intact  Sensory Exam: Superficial Peroneal, Deep Peroneal and Tibial normal  Vascular Exam: Normal  Assessment:    1 Day Post-Op  Procedure(s) (LRB): RIGHT TOTAL KNEE ARTHROPLASTY (Right)  ADDITIONAL DIAGNOSIS:  Active Problems:   Primary osteoarthritis of right knee   S/P TKR (total knee replacement) using cement, right    Plan: Physical Therapy as ordered Partial Weight Bearing @ 50% (PWB)  DVT Prophylaxis:  Xarelto, Foot Pumps and TED hose  DISCHARGE PLAN: Home  DISCHARGE NEEDS: HHPT, CPM, Walker and 3-in-1 comode seat D/c foley, OOB with PT, saline lock IV  -doing well POD #1    [name]  03/22/2017 7:48 AM  Patient ID: [name], female   DOB: [birth year], 66 y.o.   MRN: [id number]

## 2017-03-22 NOTE — Progress Notes (Signed)
Physical Therapy Treatment Patient Details Name: [name] MRN: [id number] DOB: 10-Jan-1951 Today's Date: 03/22/2017    History of Present Illness Pt is a 66 y/o female s/p elective R TKA. PMH includes HTN, CVA, and osteopenia.     PT Comments    Patient received in bed, pleasant and willing to work with skilled PT services; able to complete bed mobility with supervision but does require min guard during transfers and ambulation for safety, also due to occasional mild posterior LOB with walker. Able to perform bathroom activities with S from PT. AROM R knee 3 to 61 degrees.  Spent extensive time educating on/discussing stair navigation with walker and PWB status, also practiced stair technique this morning with strong emphasis on good guarding/stabilization technique of walker to prevent falls/slips when navigating stairs- will plan to follow up with this during afternoon session as well.  Patient left up in chair with all questions/concerns addressed, all needs met this morning.    Follow Up Recommendations  DC plan and follow up therapy as arranged by surgeon;Supervision for mobility/OOB     Equipment Recommendations  Rolling walker with 5" wheels;3in1 (PT)    Recommendations for Other Services       Precautions / Restrictions Precautions Precautions: Knee Precaution Booklet Issued: Yes (comment) Restrictions Weight Bearing Restrictions: Yes RLE Weight Bearing: Partial weight bearing RLE Partial Weight Bearing Percentage or Pounds: 50    Mobility  Bed Mobility Overal bed mobility: Needs Assistance Bed Mobility: Supine to Sit     Supine to sit: Supervision        Transfers Overall transfer level: Needs assistance Equipment used: Rolling walker (2 wheeled) Transfers: Sit to/from Stand Sit to Stand: Min guard         General transfer comment: Min guard due to unsteadiness, occasional posterior LOB (mild)  Ambulation/Gait Ambulation/Gait assistance: Min  guard Ambulation Distance (Feet): 30 Feet(in room ) Assistive device: Rolling walker (2 wheeled) Gait Pattern/deviations: Step-to pattern;Decreased step length - right;Decreased step length - left;Decreased weight shift to right;Antalgic     General Gait Details: gait pattern improved, mod cues to maintain PWB R LE   Stairs            Wheelchair Mobility    Modified Rankin (Stroke Patients Only)       Balance Overall balance assessment: Needs assistance Sitting-balance support: No upper extremity supported;Feet supported Sitting balance-[name] Scale: Good     Standing balance support: Bilateral upper extremity supported;During functional activity Standing balance-[name] Scale: Fair Standing balance comment: occasional posterior LOB (mild)                            Cognition Arousal/Alertness: Awake/alert Behavior During Therapy: WFL for tasks assessed/performed Overall Cognitive Status: Within Functional Limits for tasks assessed                                        Exercises Total Joint Exercises Goniometric ROM: R knee AROM 3 degrees extension to 61 degrees flexion     General Comments        Pertinent Vitals/Pain Pain Assessment: 0-10 Pain Score: 2  Pain Location: R knee  Pain Descriptors / Indicators: Aching;Operative site guarding Pain Intervention(s): Limited activity within patient's tolerance;Monitored during session    Home Living  Prior Function            PT Goals (current goals can now be found in the care plan section) Acute Rehab PT Goals Patient Stated Goal: to get better  PT Goal Formulation: With patient Time For Goal Achievement: 03/28/17 Potential to Achieve Goals: Good Progress towards PT goals: Progressing toward goals    Frequency    7X/week      PT Plan Current plan remains appropriate    Co-evaluation              AM-PAC PT "6 Clicks" Daily Activity   Outcome Measure  Difficulty turning over in bed (including adjusting bedclothes, sheets and blankets)?: Unable Difficulty moving from lying on back to sitting on the side of the bed? : Unable Difficulty sitting down on and standing up from a chair with arms (e.g., wheelchair, bedside commode, etc,.)?: Unable Help needed moving to and from a bed to chair (including a wheelchair)?: A Little Help needed walking in hospital room?: A Little Help needed climbing 3-5 steps with a railing? : A Lot 6 Click Score: 11    End of Session   Activity Tolerance: Patient tolerated treatment well Patient left: in chair;with call bell/phone within reach   PT Visit Diagnosis: Unsteadiness on feet (R26.81);Other abnormalities of gait and mobility (R26.89);Pain Pain - Right/Left: Right Pain - part of body: Knee     Time: 8768-1157 PT Time Calculation (min) (ACUTE ONLY): 33 min  Charges:  $Gait Training: 8-22 mins $Self Care/Home Management: 8-22                    G Codes:       Deniece Ree PT, DPT, CBIS  Supplemental Physical Therapist [place]

## 2017-03-22 NOTE — Op Note (Signed)
NAME:  [name], [name]                   ACCOUNT NO.:  MEDICAL RECORD NO.:  [id number]  LOCATION:                                 FACILITY:  PHYSICIAN:  Vonna Kotyk. Durward Fortes, M.D.    DATE OF BIRTH:  DATE OF PROCEDURE:  03/21/2017 DATE OF DISCHARGE:                              OPERATIVE REPORT   PREOPERATIVE DIAGNOSIS:  End-stage osteoarthritis, right knee.  POSTOPERATIVE DIAGNOSIS:  End-stage osteoarthritis, right knee.  PROCEDURE:  Right total knee replacement.  SURGEON:  Vonna Kotyk. Durward Fortes, M.D.  ASSISTANT:  Aaron Edelman D. Petrarca, P.A.-C.  ANESTHESIA:  Spinal with IV sedation and adductor canal block.  COMPLICATIONS:  None.  COMPONENTS:  DePuy LCS standard femoral component, a #2.5 rotating keeled tibial tray with a 10-mm polyethylene bridging bearing, metal back 3-peg rotating patella.  Components were secured with polymethyl methacrylate.  DESCRIPTION OF PROCEDURE:  Ms. Kirker was met in the holding area, identified the right knee as the appropriate operative site and marked it accordingly.  Anesthesia performed an adductor canal block.  The patient was then transported to room #7.  Spinal anesthesia was performed by Anesthesia.  The patient was then placed comfortably on the operating table in supine position.  Nursing staff inserted a Foley catheter.  Urine was clear.  Right lower extremity was then placed in a thigh tourniquet.  The right lower extremity was then prepped with chlorhexidine scrub and DuraPrep x2 from the tourniquet to the tips of the toes.  Sterile draping was performed.  Time-out was called.  The extremity was then elevated and Esmarch exsanguinated with a proximal tourniquet at 350 mmHg.  A midline longitudinal incision was made centered about the patella extending from the superior pouch to the tibial tubercle via sharp dissection, incision carried down to subcutaneous tissue.  First layer of capsule was incised in the midline.  A medial  parapatellar incision was made with the Bovie.  Joint was entered.  There was minimal clear yellow joint effusion.  Patella was everted 180 degrees laterally, the knee flexed to 90 degrees.  There were moderate-sized osteophytes along the medial and lateral femoral condyle and smaller ones above the medial tibial plateau.  These were excised.  I measured a standard femoral component.  First, bony cut was then made transversely in the proximal tibia with a 7-degree angle of declination.  After each bony cut on the tibia and the femur, used the external tibial guide to be sure we had appropriate alignment.  Subsequent cuts were then made on the femur using the external jig standard in size.  Flexion and extension gaps were perfectly symmetrical at 10 mm.  Lamina spreaders were placed along the medial and lateral compartment.  I excised medial and lateral menisci as well as ACL and PCL.  There were no loose bodies.  There was a Horticulturist, commercial cyst noted posteromedially, this was debrided. Osteophytes in the posterior femoral condyle were removed with 3/4-inch curved osteotome.  Final cuts were then made on the femur using a 4-degree distal femoral valgus cut.  Retractors were then placed around the tibia, was advanced anteriorly. I measured a #2.5 tibial tray.  This was  pinned in place.  A 10-mm polyethylene bridging bearing was then applied, followed by the trial standard femoral component.  This was reduced and through a full range of motion remained perfectly stable without any instability with varus and valgus stress.  Patella was prepared by removing approximately 9 mm of bone leaving 12 mm of patellar thickness.  Patella jig was applied, 3 holes made, trial patella inserted and reduced, and through a full range of motion, made perfectly stable.  Trial components were then removed.  The joint was copiously irrigated with saline solution.  The final components were then  impacted with polymethyl methacrylate.  I did use a trial polyethylene component and after maturation of the methacrylate, removed that and removed any extraneous methacrylate from the posterior aspect of the knee.  The final 10 mm bridging bearing was inserted and reduced and again through a full range of motion remained stable.  Tourniquet was deflated at 72 minutes.  We did inject the deep capsule with 0.25% Marcaine with epinephrine.  We also applied topical tranexamic acid for about 5 minutes under pressure.  Hemovac was not necessary.  The deep capsule was then closed with a running 0 Ethibond.  Deep capsule was closed with a 0 and 2-0 Vicryl and 3-0 Monocryl.  Skin closed with skin clips.  Sterile bulky dressing was applied, followed by the patient's support stocking.  The patient returned to the postanesthesia recovery room in satisfactory condition.     Vonna Kotyk. Durward Fortes, M.D.     PWW/MEDQ  D:  03/21/2017  T:  03/21/2017  Job:  [id number]

## 2017-03-22 NOTE — Progress Notes (Signed)
Physical Therapy Treatment Patient Details Name: [name] MRN: [id number] DOB: [birth year] Today's Date: 03/22/2017    History of Present Illness Pt is a 66 y/o female s/p elective R TKA. PMH includes HTN, CVA, and osteopenia.     PT Comments    Patient received upright in chair, pleasant and willing to work with skilled PT services this afternoon. Ambulated to bathroom and able to complete toileting/self-care with S from PT. Gait trained approximately 61f in hallway this afternoon with cues for upright posture, 50% PWB status, and heel-toe pattern; gait mechanics and consistency are improving overall, patient limited by fatigue today. Continued working on total knee exercises with assist from PT as appropriate. Patient reports she is feeling well and has no significant complaints regarding return home, gait, or stair navigation that PT can address today. Patient left in bed with all questions/concerns addressed, all needs met this afternoon.    Follow Up Recommendations  DC plan and follow up therapy as arranged by surgeon;Supervision for mobility/OOB     Equipment Recommendations  Rolling walker with 5" wheels;3in1 (PT)    Recommendations for Other Services       Precautions / Restrictions Precautions Precautions: Knee Restrictions Weight Bearing Restrictions: Yes RLE Weight Bearing: Partial weight bearing RLE Partial Weight Bearing Percentage or Pounds: 50    Mobility  Bed Mobility Overal bed mobility: Needs Assistance Bed Mobility: Sit to Supine       Sit to supine: Min guard      Transfers Overall transfer level: Needs assistance Equipment used: Rolling walker (2 wheeled) Transfers: Sit to/from Stand Sit to Stand: Min guard         General transfer comment: cues on technique and to maintain 50% PWB   Ambulation/Gait Ambulation/Gait assistance: Supervision Ambulation Distance (Feet): 90 Feet Assistive device: Rolling walker (2 wheeled) Gait  Pattern/deviations: Step-to pattern;Decreased step length - right;Decreased step length - left;Decreased weight shift to right;Antalgic;Trunk flexed     General Gait Details: cues for upright posture, 50% PWB   Stairs            Wheelchair Mobility    Modified Rankin (Stroke Patients Only)       Balance Overall balance assessment: Needs assistance Sitting-balance support: Bilateral upper extremity supported;Feet unsupported Sitting balance-[name] Scale: Good     Standing balance support: Bilateral upper extremity supported;During functional activity Standing balance-[name] Scale: Good Standing balance comment: dependent on UEs for balance and to maintain PWB status                             Cognition Arousal/Alertness: Awake/alert Behavior During Therapy: WFL for tasks assessed/performed Overall Cognitive Status: Within Functional Limits for tasks assessed                                        Exercises Total Joint Exercises Quad Sets: Right;10 reps;Supine Short Arc Quad: Right;10 reps;Supine Heel Slides: Right;10 reps;Supine;AAROM Hip ABduction/ADduction: Right;10 reps;Supine Straight Leg Raises: Right;10 reps;Supine Long Arc Quad: Right;10 reps;Seated Knee Flexion: Right;10 reps;Seated    General Comments        Pertinent Vitals/Pain Pain Assessment: 0-10 Pain Score: 4  Pain Location: R knee  Pain Descriptors / Indicators: Aching;Operative site guarding Pain Intervention(s): Limited activity within patient's tolerance;Monitored during session    Home Living  Prior Function            PT Goals (current goals can now be found in the care plan section) Acute Rehab PT Goals Patient Stated Goal: to get better  PT Goal Formulation: With patient Time For Goal Achievement: 03/28/17 Potential to Achieve Goals: Good Progress towards PT goals: Progressing toward goals    Frequency     7X/week      PT Plan Current plan remains appropriate    Co-evaluation              AM-PAC PT "6 Clicks" Daily Activity  Outcome Measure  Difficulty turning over in bed (including adjusting bedclothes, sheets and blankets)?: Unable Difficulty moving from lying on back to sitting on the side of the bed? : Unable Difficulty sitting down on and standing up from a chair with arms (e.g., wheelchair, bedside commode, etc,.)?: Unable Help needed moving to and from a bed to chair (including a wheelchair)?: A Little Help needed walking in hospital room?: A Little Help needed climbing 3-5 steps with a railing? : A Lot 6 Click Score: 11    End of Session   Activity Tolerance: Patient tolerated treatment well Patient left: in bed;with SCD's reapplied;with call bell/phone within reach   PT Visit Diagnosis: Unsteadiness on feet (R26.81);Other abnormalities of gait and mobility (R26.89);Pain Pain - Right/Left: Right Pain - part of body: Knee     Time: 4431-5400 PT Time Calculation (min) (ACUTE ONLY): 32 min  Charges:  $Gait Training: 8-22 mins $Therapeutic Exercise: 8-22 mins                    G Codes:       Deniece Ree PT, DPT, CBIS  Supplemental Physical Therapist [place]

## 2017-03-23 LAB — BASIC METABOLIC PANEL
ANION GAP: 8 (ref 5–15)
BUN: 15 mg/dL (ref 6–20)
CHLORIDE: 101 mmol/L (ref 101–111)
CO2: 29 mmol/L (ref 22–32)
Calcium: 9 mg/dL (ref 8.9–10.3)
Creatinine, Ser: 0.98 mg/dL (ref 0.44–1.00)
GFR calc Af Amer: >60 mL/min (ref >60)
GFR, EST NON AFRICAN AMERICAN: 59 mL/min — AB (ref >60)
Glucose, Bld: 89 mg/dL (ref 65–99)
POTASSIUM: 3.9 mmol/L (ref 3.5–5.1)
Sodium: 138 mmol/L (ref 135–145)

## 2017-03-23 LAB — CBC
HEMATOCRIT: 36.8 % (ref 36.0–46.0)
HEMOGLOBIN: 11.9 g/dL — AB (ref 12.0–15.0)
MCH: 29.9 pg (ref 26.0–34.0)
MCHC: 32.3 g/dL (ref 30.0–36.0)
MCV: 92.5 fL (ref 78.0–100.0)
PLATELETS: 168 10*3/uL (ref 150–400)
RBC: 3.98 MIL/uL (ref 3.87–5.11)
RDW: 12.8 % (ref 11.5–15.5)
WBC: 15.8 10*3/uL — AB (ref 4.0–10.5)

## 2017-03-23 MED ORDER — OXYCODONE HCL 5 MG PO TABS: 5.0000 mg | ORAL_TABLET | ORAL | 0 refills | Status: DC | PRN

## 2017-03-23 MED ORDER — RIVAROXABAN 10 MG PO TABS: 10.0000 mg | ORAL_TABLET | Freq: Every day | ORAL | 0 refills | Status: DC

## 2017-03-23 NOTE — Progress Notes (Signed)
Pt given discharge instructions, gone over with her, answered any questions she had to satisfaction. Pt in no distress at time of discharge. Walker and Bedside commode sent with husband to car. Has all belongings with her.

## 2017-03-23 NOTE — Plan of Care (Signed)
  Activity: Risk for activity intolerance will decrease 03/23/2017 1146 - Adequate for Discharge by Governor Rooks, RN   Pain Managment: General experience of comfort will improve 03/23/2017 1146 - Adequate for Discharge by Governor Rooks, RN   Safety: Ability to remain free from injury will improve 03/23/2017 1146 - Adequate for Discharge by Governor Rooks, RN   Activity: Ability to avoid complications of mobility impairment will improve 03/23/2017 1146 - Adequate for Discharge by Governor Rooks, RN   Activity: Range of joint motion will improve 03/23/2017 1146 - Adequate for Discharge by Governor Rooks, RN   Pain Management: Pain level will decrease with appropriate interventions 03/23/2017 1146 - Adequate for Discharge by Governor Rooks, RN

## 2017-03-23 NOTE — Discharge Summary (Signed)
Stacy Fears, MD   Biagio Borg, PA-C [address], [place], [place]  [address]                             [phone]  PATIENT ID: [name]        MRN:  [id number]          DOB/AGE: 66/18/1952 / 66 y.o.    DISCHARGE SUMMARY  ADMISSION DATE:    03/21/2017 DISCHARGE DATE:   03/23/2017   ADMISSION DIAGNOSIS: Osteoarthritis Right Knee    DISCHARGE DIAGNOSIS:  Osteoarthritis Right Knee    ADDITIONAL DIAGNOSIS: Active Problems:   Primary osteoarthritis of right knee   S/P TKR (total knee replacement) using cement, right  Past Medical History:  Diagnosis Date  . Arthritis    back, fingers with joint pain and swelling.  chronic back pain  . Chronic back pain    arthritis   . Diverticulosis   . Elevated cholesterol    takes Niacin daily and Simvastatin  . GERD (gastroesophageal reflux disease) 06/2004   non-specific gastritis on EGD 06/2004  . Headache(784.0)    occasionally  . History of colon polyps 2004, 2009   2004:adenomatous. 2009 hyperplastic.   Marland Kitchen History of hiatal hernia   . Hypertension    takes Tenoretic and Lisinopril daily  . Mucoid cyst of joint 08/2013   right index finger  . Osteopenia 01/2014   T score -1.1 FRAX 14%/0.5%. Stable from prior DEXA  . PONV (postoperative nausea and vomiting)   . Seasonal allergies   . Stroke ([place]) 1998   x 2 - mild left-sided weakness  . Urge incontinence   . Uterine prolapse     PROCEDURE: Procedure(s): RIGHT TOTAL KNEE ARTHROPLASTY  on 03/21/2017  CONSULTS: none    HISTORY:Patient is a 66 y.o. female presented with a history of pain in the right knee for 10 years. Onset of symptoms was gradual starting 4 years ago with gradually worsening course since that time. Prior procedures on the knee are none. Patient has been treated conservatively with over-the-counter NSAIDs, injections, and activity modification. Patient currently rates pain in the knee at 7 out of 10 with activity. There is pain at night.  present.  They have been previously treated with: NSAIDS: NSAID with mild improvement  Knee injection with corticosteroid  was performed Knee injection with visco supplementation was performed Medications: NSAID with mild improvement    HOSPITAL COURSE:  [name] is a 66 y.o. admitted on 03/21/2017 and found to have a diagnosis of Osteoarthritis Right Knee.  After appropriate laboratory studies were obtained  they were taken to the operating room on 03/21/2017 and underwent  Procedure(s): RIGHT TOTAL KNEE ARTHROPLASTY  .   They were given perioperative antibiotics:  Anti-infectives (From admission, onward)   Start     Dose/Rate Route Frequency Ordered Stop   03/21/17 1330  ceFAZolin (ANCEF) IVPB 2g/100 mL premix     2 g 200 mL/hr over 30 Minutes Intravenous Every 6 hours 03/21/17 1124 03/22/17 0212   03/21/17 0700  ceFAZolin (ANCEF) IVPB 2g/100 mL premix     2 g 200 mL/hr over 30 Minutes Intravenous To ShortStay Surgical 03/20/17 1252 03/21/17 0723    .  Tolerated the procedure well.  Placed with a foley intraoperatively.   Toradol was given post op.  POD #1, allowed out of bed to a chair.  PT for ambulation and exercise program.  Foley D/C'd in morning.  IV saline locked.  O2 discontionued.  POD #2, continued PT and ambulation.   .  The remainder of the hospital course was dedicated to ambulation and strengthening.   The patient was discharged on 2 Days Post-Op in  Stable condition.  Blood products given:none  DIAGNOSTIC STUDIES: Recent vital signs:  Patient Vitals for the past 24 hrs:  BP Temp Temp src Pulse Resp SpO2  03/23/17 0433 128/78 (!) 97.5 F (36.4 C) Oral 71 17 100 %  03/22/17 2109 121/72 (!) 97.5 F (36.4 C) Oral 61 18 98 %  03/22/17 1300 98/62 (!) 97.5 F (36.4 C) Oral 65 18 97 %       Recent laboratory studies: Recent Labs    03/22/17 0531 03/23/17 0536  WBC 15.6* 15.8*  HGB 12.3 11.9*  HCT 38.1 36.8  PLT 178 168   Recent Labs     03/22/17 0531 03/23/17 0536  NA 139 138  K 4.0 3.9  CL 105 101  CO2 25 29  BUN 14 15  CREATININE 0.94 0.98  GLUCOSE 146* 89  CALCIUM 9.2 9.0   Lab Results  Component Value Date   INR 1.08 03/14/2017   INR 1.10 09/10/2014     Recent Radiographic Studies :  Dg Chest 2 View  Result Date: 03/14/2017 CLINICAL DATA:  Preoperative examination. Patient for knee replacement. EXAM: CHEST  2 VIEW COMPARISON:  PA and lateral chest 11/16/2016 and 11/25/2013. FINDINGS: The lungs are clear. Heart size is normal. No pneumothorax or pleural effusion. Aortic atherosclerosis noted. No acute bony abnormality. IMPRESSION: No acute disease. Electronically Signed   By: Inge Rise M.D.   On: 03/14/2017 15:37    DISCHARGE INSTRUCTIONS: Discharge Instructions    CPM   Complete by:  As directed    Continuous passive motion machine (CPM):      Use the CPM from 0 to 60 for 6-8 hours per day.      You may increase by 5-10 degrees per day.  You may break it up into 2 or 3 sessions per day.      Use CPM for 3-4  weeks or until you are told to stop.   CPM   Complete by:  As directed    Continuous passive motion machine (CPM):      Use the CPM from 0 to 60 for 6-8 hours per day.      You may increase by 5-10 degrees per day.  You may break it up into 2 or 3 sessions per day.      Use CPM for 3-4  weeks or until you are told to stop.   Call MD / Call 911   Complete by:  As directed    If you experience chest pain or shortness of breath, CALL 911 and be transported to the hospital emergency room.  If you develope a fever above 101 F, pus (white drainage) or increased drainage or redness at the wound, or calf pain, call your surgeon's office.   Call MD / Call 911   Complete by:  As directed    If you experience chest pain or shortness of breath, CALL 911 and be transported to the hospital emergency room.  If you develope a fever above 101 F, pus (white drainage) or increased drainage or redness at the  wound, or calf pain, call your surgeon's office.   Change dressing   Complete by:  As directed    DO NOT  CHANGE YOUR DRESSING   Change dressing   Complete by:  As directed    DO NOT CHANGE YOUR DRESSING   Constipation Prevention   Complete by:  As directed    Drink plenty of fluids.  Prune juice may be helpful.  You may use a stool softener, such as Colace (over the counter) 100 mg twice a day.  Use MiraLax (over the counter) for constipation as needed.   Constipation Prevention   Complete by:  As directed    Drink plenty of fluids.  Prune juice may be helpful.  You may use a stool softener, such as Colace (over the counter) 100 mg twice a day.  Use MiraLax (over the counter) for constipation as needed.   Diet general   Complete by:  As directed    Diet general   Complete by:  As directed    Discharge instructions   Complete by:  As directed    INSTRUCTIONS AFTER JOINT REPLACEMENT   Remove items at home which could result in a fall. This includes throw rugs or furniture in walking pathways ICE to the affected joint every three hours while awake for 30 minutes at a time, for at least the first 3-5 days, and then as needed for pain and swelling.  Continue to use ice for pain and swelling. You may notice swelling that will progress down to the foot and ankle.  This is normal after surgery.  Elevate your leg when you are not up walking on it.   Continue to use the breathing machine you got in the hospital (incentive spirometer) which will help keep your temperature down.  It is common for your temperature to cycle up and down following surgery, especially at night when you are not up moving around and exerting yourself.  The breathing machine keeps your lungs expanded and your temperature down.   DIET:  As you were doing prior to hospitalization, we recommend a well-balanced diet.  DRESSING / WOUND CARE / SHOWERING  Keep the surgical dressing until follow up.  The dressing is water proof,  so you can shower without any extra covering.  IF THE DRESSING FALLS OFF or the wound gets wet inside, change the dressing with sterile gauze.  Please use good hand washing techniques before changing the dressing.  Do not use any lotions or creams on the incision until instructed by your surgeon.    ACTIVITY  Increase activity slowly as tolerated, but follow the weight bearing instructions below.   No driving for 6 weeks or until further direction given by your physician.  You cannot drive while taking narcotics.  No lifting or carrying greater than 10 lbs. until further directed by your surgeon. Avoid periods of inactivity such as sitting longer than an hour when not asleep. This helps prevent blood clots.  You may return to work once you are authorized by your doctor.     WEIGHT BEARING   Partial weight bearing with assist device as directed.  50%   EXERCISES  Results after joint replacement surgery are often greatly improved when you follow the exercise, range of motion and muscle strengthening exercises prescribed by your doctor. Safety measures are also important to protect the joint from further injury. Any time any of these exercises cause you to have increased pain or swelling, decrease what you are doing until you are comfortable again and then slowly increase them. If you have problems or questions, call your caregiver or physical therapist for advice.  Rehabilitation is important following a joint replacement. After just a few days of immobilization, the muscles of the leg can become weakened and shrink (atrophy).  These exercises are designed to build up the tone and strength of the thigh and leg muscles and to improve motion. Often times heat used for twenty to thirty minutes before working out will loosen up your tissues and help with improving the range of motion but do not use heat for the first two weeks following surgery (sometimes heat can increase post-operative swelling).     These exercises can be done on a training (exercise) mat, on the floor, on a table or on a bed. Use whatever works the best and is most comfortable for you.    Use music or television while you are exercising so that the exercises are a pleasant break in your day. This will make your life better with the exercises acting as a break in your routine that you can look forward to.   Perform all exercises about fifteen times, three times per day or as directed.  You should exercise both the operative leg and the other leg as well.   Exercises include:  Quad Sets - Tighten up the muscle on the front of the thigh (Quad) and hold for 5-10 seconds.   Straight Leg Raises - With your knee straight (if you were given a brace, keep it on), lift the leg to 60 degrees, hold for 3 seconds, and slowly lower the leg.  Perform this exercise against resistance later as your leg gets stronger.  Leg Slides: Lying on your back, slowly slide your foot toward your buttocks, bending your knee up off the floor (only go as far as is comfortable). Then slowly slide your foot back down until your leg is flat on the floor again.  Angel Wings: Lying on your back spread your legs to the side as far apart as you can without causing discomfort.  Hamstring Strength:  Lying on your back, push your heel against the floor with your leg straight by tightening up the muscles of your buttocks.  Repeat, but this time bend your knee to a comfortable angle, and push your heel against the floor.  You may put a pillow under the heel to make it more comfortable if necessary.   A rehabilitation program following joint replacement surgery can speed recovery and prevent re-injury in the future due to weakened muscles. Contact your doctor or a physical therapist for more information on knee rehabilitation.    CONSTIPATION  Constipation is defined medically as fewer than three stools per week and severe constipation as less than one stool per week.   Even if you have a regular bowel pattern at home, your normal regimen is likely to be disrupted due to multiple reasons following surgery.  Combination of anesthesia, postoperative narcotics, change in appetite and fluid intake all can affect your bowels.   YOU MUST use at least one of the following options; they are listed in order of increasing strength to get the job done.  They are all available over the counter, and you may need to use some, POSSIBLY even all of these options:    Drink plenty of fluids (prune juice may be helpful) and high fiber foods Colace 100 mg by mouth twice a day  Senokot for constipation as directed and as needed Dulcolax (bisacodyl), take with full glass of water  Miralax (polyethylene glycol) once or twice a day as needed.  If you  have tried all these things and are unable to have a bowel movement in the first 3-4 days after surgery call either your surgeon or your primary doctor.    If you experience loose stools or diarrhea, hold the medications until you stool forms back up.  If your symptoms do not get better within 1 week or if they get worse, check with your doctor.  If you experience "the worst abdominal pain ever" or develop nausea or vomiting, please contact the office immediately for further recommendations for treatment.   ITCHING:  If you experience itching with your medications, try taking only a single pain pill, or even half a pain pill at a time.  You can also use Benadryl over the counter for itching or also to help with sleep.   TED HOSE STOCKINGS:  Use stockings on both legs until for at least 2 weeks or as directed by physician office. They may be removed at night for sleeping.  MEDICATIONS:  See your medication summary on the "After Visit Summary" that nursing will review with you.  You may have some home medications which will be placed on hold until you complete the course of blood thinner medication.  It is important for you to complete the  blood thinner medication as prescribed.  PRECAUTIONS:  If you experience chest pain or shortness of breath - call 911 immediately for transfer to the hospital emergency department.   If you develop a fever greater that 101 F, purulent drainage from wound, increased redness or drainage from wound, foul odor from the wound/dressing, or calf pain - CONTACT YOUR SURGEON.                                                   FOLLOW-UP APPOINTMENTS:  If you do not already have a post-op appointment, please call the office for an appointment to be seen by your surgeon.  Guidelines for how soon to be seen are listed in your "After Visit Summary", but are typically between 1-4 weeks after surgery.  OTHER INSTRUCTIONS:   Knee Replacement:  Do not place pillow under knee, focus on keeping the knee straight while resting. CPM instructions: 0-90 degrees, 2 hours in the morning, 2 hours in the afternoon, and 2 hours in the evening. Place foam block, curve side up under heel at all times except when in CPM or when walking.  DO NOT modify, tear, cut, or change the foam block in any way.  MAKE SURE YOU:  Understand these instructions.  Get help right away if you are not doing well or get worse.    Thank you for letting us be a part of your medical care team.  It is a privilege we respect greatly.  We hope these instructions will help you stay on track for a fast and full recovery!   Discharge instructions   Complete by:  As directed    INSTRUCTIONS AFTER JOINT REPLACEMENT   Remove items at home which could result in a fall. This includes throw rugs or furniture in walking pathways ICE to the affected joint every three hours while awake for 30 minutes at a time, for at least the first 3-5 days, and then as needed for pain and swelling.  Continue to use ice for pain and swelling. You may notice swelling that will progress down to  the foot and ankle.  This is normal after surgery.  Elevate your leg when you are not  up walking on it.   Continue to use the breathing machine you got in the hospital (incentive spirometer) which will help keep your temperature down.  It is common for your temperature to cycle up and down following surgery, especially at night when you are not up moving around and exerting yourself.  The breathing machine keeps your lungs expanded and your temperature down.   DIET:  As you were doing prior to hospitalization, we recommend a well-balanced diet.  DRESSING / WOUND CARE / SHOWERING  Keep the surgical dressing until follow up.  The dressing is water proof, so you can shower without any extra covering.  IF THE DRESSING FALLS OFF or the wound gets wet inside, change the dressing with sterile gauze.  Please use good hand washing techniques before changing the dressing.  Do not use any lotions or creams on the incision until instructed by your surgeon.    ACTIVITY  Increase activity slowly as tolerated, but follow the weight bearing instructions below.   No driving for 6 weeks or until further direction given by your physician.  You cannot drive while taking narcotics.  No lifting or carrying greater than 10 lbs. until further directed by your surgeon. Avoid periods of inactivity such as sitting longer than an hour when not asleep. This helps prevent blood clots.  You may return to work once you are authorized by your doctor.     WEIGHT BEARING   Partial weight bearing with assist device as directed.  50%   EXERCISES  Results after joint replacement surgery are often greatly improved when you follow the exercise, range of motion and muscle strengthening exercises prescribed by your doctor. Safety measures are also important to protect the joint from further injury. Any time any of these exercises cause you to have increased pain or swelling, decrease what you are doing until you are comfortable again and then slowly increase them. If you have problems or questions, call your  caregiver or physical therapist for advice.   Rehabilitation is important following a joint replacement. After just a few days of immobilization, the muscles of the leg can become weakened and shrink (atrophy).  These exercises are designed to build up the tone and strength of the thigh and leg muscles and to improve motion. Often times heat used for twenty to thirty minutes before working out will loosen up your tissues and help with improving the range of motion but do not use heat for the first two weeks following surgery (sometimes heat can increase post-operative swelling).   These exercises can be done on a training (exercise) mat, on the floor, on a table or on a bed. Use whatever works the best and is most comfortable for you.    Use music or television while you are exercising so that the exercises are a pleasant break in your day. This will make your life better with the exercises acting as a break in your routine that you can look forward to.   Perform all exercises about fifteen times, three times per day or as directed.  You should exercise both the operative leg and the other leg as well.   Exercises include:  Quad Sets - Tighten up the muscle on the front of the thigh (Quad) and hold for 5-10 seconds.   Straight Leg Raises - With your knee straight (if you were given a  brace, keep it on), lift the leg to 60 degrees, hold for 3 seconds, and slowly lower the leg.  Perform this exercise against resistance later as your leg gets stronger.  Leg Slides: Lying on your back, slowly slide your foot toward your buttocks, bending your knee up off the floor (only go as far as is comfortable). Then slowly slide your foot back down until your leg is flat on the floor again.  Angel Wings: Lying on your back spread your legs to the side as far apart as you can without causing discomfort.  Hamstring Strength:  Lying on your back, push your heel against the floor with your leg straight by tightening up the  muscles of your buttocks.  Repeat, but this time bend your knee to a comfortable angle, and push your heel against the floor.  You may put a pillow under the heel to make it more comfortable if necessary.   A rehabilitation program following joint replacement surgery can speed recovery and prevent re-injury in the future due to weakened muscles. Contact your doctor or a physical therapist for more information on knee rehabilitation.    CONSTIPATION  Constipation is defined medically as fewer than three stools per week and severe constipation as less than one stool per week.  Even if you have a regular bowel pattern at home, your normal regimen is likely to be disrupted due to multiple reasons following surgery.  Combination of anesthesia, postoperative narcotics, change in appetite and fluid intake all can affect your bowels.   YOU MUST use at least one of the following options; they are listed in order of increasing strength to get the job done.  They are all available over the counter, and you may need to use some, POSSIBLY even all of these options:    Drink plenty of fluids (prune juice may be helpful) and high fiber foods Colace 100 mg by mouth twice a day  Senokot for constipation as directed and as needed Dulcolax (bisacodyl), take with full glass of water  Miralax (polyethylene glycol) once or twice a day as needed.  If you have tried all these things and are unable to have a bowel movement in the first 3-4 days after surgery call either your surgeon or your primary doctor.    If you experience loose stools or diarrhea, hold the medications until you stool forms back up.  If your symptoms do not get better within 1 week or if they get worse, check with your doctor.  If you experience "the worst abdominal pain ever" or develop nausea or vomiting, please contact the office immediately for further recommendations for treatment.   ITCHING:  If you experience itching with your medications,  try taking only a single pain pill, or even half a pain pill at a time.  You can also use Benadryl over the counter for itching or also to help with sleep.   TED HOSE STOCKINGS:  Use stockings on both legs until for at least 2 weeks or as directed by physician office. They may be removed at night for sleeping.  MEDICATIONS:  See your medication summary on the "After Visit Summary" that nursing will review with you.  You may have some home medications which will be placed on hold until you complete the course of blood thinner medication.  It is important for you to complete the blood thinner medication as prescribed.  PRECAUTIONS:  If you experience chest pain or shortness of breath - call 911 immediately  for transfer to the hospital emergency department.   If you develop a fever greater that 101 F, purulent drainage from wound, increased redness or drainage from wound, foul odor from the wound/dressing, or calf pain - CONTACT YOUR SURGEON.                                                   FOLLOW-UP APPOINTMENTS:  If you do not already have a post-op appointment, please call the office for an appointment to be seen by your surgeon.  Guidelines for how soon to be seen are listed in your "After Visit Summary", but are typically between 1-4 weeks after surgery.  OTHER INSTRUCTIONS:   Knee Replacement:  Do not place pillow under knee, focus on keeping the knee straight while resting. CPM instructions: 0-90 degrees, 2 hours in the morning, 2 hours in the afternoon, and 2 hours in the evening. Place foam block, curve side up under heel at all times except when in CPM or when walking.  DO NOT modify, tear, cut, or change the foam block in any way.  MAKE SURE YOU:  Understand these instructions.  Get help right away if you are not doing well or get worse.    Thank you for letting us be a part of your medical care team.  It is a privilege we respect greatly.  We hope these instructions will help you stay  on track for a fast and full recovery!   Do not put a pillow under the knee. Place it under the heel.   Complete by:  As directed    Do not put a pillow under the knee. Place it under the heel.   Complete by:  As directed    Driving restrictions   Complete by:  As directed    No driving for 6 weeks   Driving restrictions   Complete by:  As directed    No driving for 6 weeks   Increase activity slowly as tolerated   Complete by:  As directed    Increase activity slowly as tolerated   Complete by:  As directed    Lifting restrictions   Complete by:  As directed    No lifting for 6 weeks   Lifting restrictions   Complete by:  As directed    No lifting for 6 weeks   Partial weight bearing   Complete by:  As directed    % Body Weight:  50%   Laterality:  right   Extremity:  Lower   Partial weight bearing   Complete by:  As directed    % Body Weight:  50%   Laterality:  right   Extremity:  Lower   Patient may shower   Complete by:  As directed    You may shower over the brown dressing   Patient may shower   Complete by:  As directed    You may shower over the brown dressing   TED hose   Complete by:  As directed    Use stockings (TED hose) for 2-3 weeks on right leg.  You may remove them at night for sleeping.   TED hose   Complete by:  As directed    Use stockings (TED hose) for 2-3 weeks on right leg.  You may remove them at night for sleeping.  DISCHARGE MEDICATIONS:   Allergies as of 03/23/2017      Reactions   Eggs Or Egg-derived Products Other (See Comments)   GI UPSET, JOINT PAIN, DIFF. SWALLOWING   Etodolac Other (See Comments)   ABD. CRAMPING   Fish-derived Products Other (See Comments)   GI UPSET, JOINT PAIN, DIFF. SWALLOWING    Omeprazole Other (See Comments)   ABD. CRAMPING   Pineapple Other (See Comments)   GI UPSET, JOINT PAIN, DIFF. SWALLOWING   Gluten Meal Diarrhea   GI upset      Medication List    STOP taking these medications     aspirin 81 MG tablet     TAKE these medications   acetaminophen 500 MG tablet Commonly known as:  TYLENOL Take 1,000 mg by mouth every 8 (eight) hours as needed for mild pain.   atenolol-chlorthalidone 50-25 MG tablet Commonly known as:  TENORETIC Take 1 tablet by mouth daily.   b complex vitamins tablet Take 1 tablet by mouth daily.   CALCIUM + D PO Take 1 tablet by mouth daily.   Chromium 1 MG Caps Take 1 mg by mouth daily.   CoQ10 100 MG Caps Take 100 mg by mouth every evening.   D 5000 5000 units capsule Generic drug:  Cholecalciferol Take 5,000 Units by mouth daily.   diphenoxylate-atropine 2.5-0.025 MG tablet Commonly known as:  LOMOTIL TAKE ONE TABLET BY MOUTH 3 TIMES DAILY AS NEEDED FOR  DIARRHEA  OR  LOOSE  STOOLS What changed:    how much to take  how to take this  when to take this  reasons to take this  additional instructions   folic acid 654 MCG tablet Commonly known as:  FOLVITE Take 800 mcg by mouth every evening.   hyoscyamine 0.125 MG SL tablet Commonly known as:  LEVSIN SL Take one tablet by mouth before meals three times a day   ketotifen 0.025 % ophthalmic solution Commonly known as:  ZADITOR Place 2 drops into both eyes as needed (allergies).   KLOR-CON M20 20 MEQ tablet Generic drug:  potassium chloride SA TAKE 1 TABLET TWICE A DAY   lisinopril 2.5 MG tablet Commonly known as:  PRINIVIL,ZESTRIL Take 1 tablet (2.5 mg total) by mouth daily.   Magnesium 250 MG Tabs Take 250 mg by mouth 2 (two) times daily.   Melatonin 3 MG Tabs Take 3 mg by mouth at bedtime.   methocarbamol 500 MG tablet Commonly known as:  ROBAXIN TAKE 1 TABLET EVERY 6 HOURSAS NEEDED FOR MUSCLE SPASMS   multivitamin tablet Take 1 tablet by mouth daily.   oxyCODONE 5 MG immediate release tablet Commonly known as:  Oxy IR/ROXICODONE Take 1-2 tablets (5-10 mg total) by mouth every 4 (four) hours as needed for moderate pain ((score 4 to 6)).    pyridOXINE 100 MG tablet Commonly known as:  VITAMIN B-6 Take 100 mg by mouth every evening.   rivaroxaban 10 MG Tabs tablet Commonly known as:  XARELTO Take 1 tablet (10 mg total) by mouth daily with breakfast.   simvastatin 20 MG tablet Commonly known as:  ZOCOR Take 1 tablet (20 mg total) by mouth daily. What changed:  when to take this   vitamin C 1000 MG tablet Take 1,000 mg by mouth daily.   vitamin E 400 UNIT capsule Take 400 Units by mouth daily.   ZINC 15 PO Take 1 tablet by mouth daily. Liquid Blend 10 drops equal 15 mg  Durable Medical Equipment  (From admission, onward)        Start     Ordered   03/21/17 1124  DME Walker rolling  Once    Question:  Patient needs a walker to treat with the following condition  Answer:  S/P total knee replacement using cement, left   03/21/17 1124   03/21/17 1124  DME 3 n 1  Once     03/21/17 1124   03/21/17 1124  DME Bedside commode  Once    Question:  Patient needs a bedside commode to treat with the following condition  Answer:  Total knee replacement status, right   03/21/17 1124       Discharge Care Instructions  (From admission, onward)        Start     Ordered   03/23/17 0000  Partial weight bearing    Question Answer Comment  % Body Weight 50%   Laterality right   Extremity Lower      03/23/17 0744   03/23/17 0000  Change dressing    Comments:  DO NOT CHANGE YOUR DRESSING   03/23/17 0744   03/23/17 0000  Partial weight bearing    Question Answer Comment  % Body Weight 50%   Laterality right   Extremity Lower      03/23/17 0810   03/23/17 0000  Change dressing    Comments:  DO NOT CHANGE YOUR DRESSING   03/23/17 0810      FOLLOW UP VISIT:   Follow-up Information    [name], MD Follow up on 04/07/2017.   Specialty:  Orthopedic Surgery Contact information: [address] [place] [address] [phone]           DISPOSITION:   Home  CONDITION:   Stable   Mike Craze. [place], [place] [phone]  03/23/2017 8:11 AM

## 2017-03-23 NOTE — Progress Notes (Signed)
Physical Therapy Treatment Patient Details Name: [name] MRN: [id number] DOB: 1950/04/15 Today's Date: 03/23/2017    History of Present Illness Pt is a 66 y/o female s/p elective R TKA. PMH includes HTN, CVA, and osteopenia.     PT Comments    Patient is making good progress with PT.  From a mobility standpoint anticipate patient will be ready for DC home when medically ready.    Follow Up Recommendations  DC plan and follow up therapy as arranged by surgeon;Supervision for mobility/OOB     Equipment Recommendations  3in1 (PT);Other (comment)(youth sized RW)    Recommendations for Other Services       Precautions / Restrictions Precautions Precautions: Knee Precaution Booklet Issued: Yes (comment) Precaution Comments: Reviewed positioning with pt  Restrictions Weight Bearing Restrictions: Yes RLE Weight Bearing: Partial weight bearing RLE Partial Weight Bearing Percentage or Pounds: 50    Mobility  Bed Mobility Overal bed mobility: Modified Independent Bed Mobility: Supine to Sit           General bed mobility comments: increased time and effort  Transfers Overall transfer level: Needs assistance Equipment used: Rolling walker (2 wheeled) Transfers: Sit to/from Stand Sit to Stand: Min guard         General transfer comment: cues for safe hand placement  Ambulation/Gait Ambulation/Gait assistance: Supervision Ambulation Distance (Feet): 150 Feet Assistive device: Rolling walker (2 wheeled) Gait Pattern/deviations: Decreased step length - right;Decreased step length - left;Decreased weight shift to right;Antalgic;Trunk flexed;Step-through pattern Gait velocity: Decreased   General Gait Details: cues for upright posture, 50% PWB, and R heel strike   Stairs     Stair Management: No rails;With walker;Step to pattern;Backwards(backward ascent, forward descent ) Number of Stairs: 2 General stair comments: cues for sequencing and technique;  husband assisted with stabilizing RW  Wheelchair Mobility    Modified Rankin (Stroke Patients Only)       Balance Overall balance assessment: Needs assistance Sitting-balance support: Bilateral upper extremity supported;Feet unsupported Sitting balance-[name] Scale: Good     Standing balance support: Bilateral upper extremity supported;During functional activity Standing balance-[name] Scale: Fair                              Cognition Arousal/Alertness: Awake/alert Behavior During Therapy: WFL for tasks assessed/performed Overall Cognitive Status: Within Functional Limits for tasks assessed                                        Exercises      General Comments        Pertinent Vitals/Pain Pain Assessment: Faces Faces Pain Scale: Hurts whole lot Pain Location: R knee and thigh Pain Descriptors / Indicators: Grimacing;Guarding;Sore Pain Intervention(s): Limited activity within patient's tolerance;Monitored during session;Premedicated before session;Repositioned;Ice applied    Home Living                      Prior Function            PT Goals (current goals can now be found in the care plan section) Acute Rehab PT Goals Patient Stated Goal: to get better  PT Goal Formulation: With patient Time For Goal Achievement: 03/28/17 Potential to Achieve Goals: Good Progress towards PT goals: Progressing toward goals    Frequency    7X/week      PT Plan Current  plan remains appropriate    Co-evaluation              AM-PAC PT "6 Clicks" Daily Activity  Outcome Measure  Difficulty turning over in bed (including adjusting bedclothes, sheets and blankets)?: None Difficulty moving from lying on back to sitting on the side of the bed? : A Little Difficulty sitting down on and standing up from a chair with arms (e.g., wheelchair, bedside commode, etc,.)?: Unable Help needed moving to and from a bed to chair (including a  wheelchair)?: A Little Help needed walking in hospital room?: A Little Help needed climbing 3-5 steps with a railing? : A Little 6 Click Score: 17    End of Session Equipment Utilized During Treatment: Gait belt Activity Tolerance: Patient tolerated treatment well Patient left: with call bell/phone within reach;in chair;with family/visitor present Nurse Communication: Mobility status PT Visit Diagnosis: Unsteadiness on feet (R26.81);Other abnormalities of gait and mobility (R26.89);Pain Pain - Right/Left: Right Pain - part of body: Knee     Time: 6754-4920 PT Time Calculation (min) (ACUTE ONLY): 39 min  Charges:  $Gait Training: 23-37 mins $Therapeutic Activity: 8-22 mins                    G Codes:       Earney Navy, PTA Pager: [phone]     Darliss Cheney 03/23/2017, 1:15 PM

## 2017-03-24 ENCOUNTER — Telehealth: Payer: Self-pay | Admitting: *Deleted

## 2017-03-24 NOTE — Telephone Encounter (Signed)
Pt was on TCM list admitted 03/19/17 for Osteoarthritis Right Knee . Pt had TKR (total knee replacement) using cement, on 03/21/17. Pt d/c 03/23/17, and will f/u w specialist Dr. Durward Fortes on 04/07/17...[name]

## 2017-03-25 DIAGNOSIS — Z471 Aftercare following joint replacement surgery: Secondary | ICD-10-CM | POA: Diagnosis not present

## 2017-03-25 DIAGNOSIS — M8580 Other specified disorders of bone density and structure, unspecified site: Secondary | ICD-10-CM | POA: Diagnosis not present

## 2017-03-25 DIAGNOSIS — M47819 Spondylosis without myelopathy or radiculopathy, site unspecified: Secondary | ICD-10-CM | POA: Diagnosis not present

## 2017-03-25 DIAGNOSIS — I10 Essential (primary) hypertension: Secondary | ICD-10-CM | POA: Diagnosis not present

## 2017-03-25 DIAGNOSIS — Z96651 Presence of right artificial knee joint: Secondary | ICD-10-CM | POA: Diagnosis not present

## 2017-03-25 DIAGNOSIS — G8929 Other chronic pain: Secondary | ICD-10-CM | POA: Diagnosis not present

## 2017-03-27 ENCOUNTER — Telehealth (INDEPENDENT_AMBULATORY_CARE_PROVIDER_SITE_OTHER): Payer: Self-pay | Admitting: Orthopaedic Surgery

## 2017-03-27 DIAGNOSIS — Z471 Aftercare following joint replacement surgery: Secondary | ICD-10-CM | POA: Diagnosis not present

## 2017-03-27 DIAGNOSIS — G8929 Other chronic pain: Secondary | ICD-10-CM | POA: Diagnosis not present

## 2017-03-27 DIAGNOSIS — M47819 Spondylosis without myelopathy or radiculopathy, site unspecified: Secondary | ICD-10-CM | POA: Diagnosis not present

## 2017-03-27 DIAGNOSIS — Z96651 Presence of right artificial knee joint: Secondary | ICD-10-CM | POA: Diagnosis not present

## 2017-03-27 DIAGNOSIS — M8580 Other specified disorders of bone density and structure, unspecified site: Secondary | ICD-10-CM | POA: Diagnosis not present

## 2017-03-27 DIAGNOSIS — I10 Essential (primary) hypertension: Secondary | ICD-10-CM | POA: Diagnosis not present

## 2017-03-27 NOTE — Telephone Encounter (Signed)
Calling for a verbal 1wk/1, 2wk/2 for PT. Please call with verbal order.

## 2017-03-27 NOTE — Telephone Encounter (Signed)
called

## 2017-03-30 DIAGNOSIS — M8580 Other specified disorders of bone density and structure, unspecified site: Secondary | ICD-10-CM | POA: Diagnosis not present

## 2017-03-30 DIAGNOSIS — G8929 Other chronic pain: Secondary | ICD-10-CM | POA: Diagnosis not present

## 2017-03-30 DIAGNOSIS — Z96651 Presence of right artificial knee joint: Secondary | ICD-10-CM | POA: Diagnosis not present

## 2017-03-30 DIAGNOSIS — M47819 Spondylosis without myelopathy or radiculopathy, site unspecified: Secondary | ICD-10-CM | POA: Diagnosis not present

## 2017-03-30 DIAGNOSIS — I10 Essential (primary) hypertension: Secondary | ICD-10-CM | POA: Diagnosis not present

## 2017-03-30 DIAGNOSIS — Z471 Aftercare following joint replacement surgery: Secondary | ICD-10-CM | POA: Diagnosis not present

## 2017-03-31 ENCOUNTER — Other Ambulatory Visit: Payer: Self-pay

## 2017-03-31 MED ORDER — SIMVASTATIN 20 MG PO TABS: 20.0000 mg | ORAL_TABLET | Freq: Every day | ORAL | 1 refills | Status: DC

## 2017-04-05 DIAGNOSIS — M47819 Spondylosis without myelopathy or radiculopathy, site unspecified: Secondary | ICD-10-CM | POA: Diagnosis not present

## 2017-04-05 DIAGNOSIS — Z96651 Presence of right artificial knee joint: Secondary | ICD-10-CM | POA: Diagnosis not present

## 2017-04-05 DIAGNOSIS — M8580 Other specified disorders of bone density and structure, unspecified site: Secondary | ICD-10-CM | POA: Diagnosis not present

## 2017-04-05 DIAGNOSIS — I10 Essential (primary) hypertension: Secondary | ICD-10-CM | POA: Diagnosis not present

## 2017-04-05 DIAGNOSIS — G8929 Other chronic pain: Secondary | ICD-10-CM | POA: Diagnosis not present

## 2017-04-05 DIAGNOSIS — Z471 Aftercare following joint replacement surgery: Secondary | ICD-10-CM | POA: Diagnosis not present

## 2017-04-07 ENCOUNTER — Ambulatory Visit (INDEPENDENT_AMBULATORY_CARE_PROVIDER_SITE_OTHER): Payer: Medicare Other

## 2017-04-07 ENCOUNTER — Ambulatory Visit (INDEPENDENT_AMBULATORY_CARE_PROVIDER_SITE_OTHER): Payer: Medicare Other | Admitting: Orthopaedic Surgery

## 2017-04-07 ENCOUNTER — Encounter (INDEPENDENT_AMBULATORY_CARE_PROVIDER_SITE_OTHER): Payer: Self-pay | Admitting: Orthopaedic Surgery

## 2017-04-07 VITALS — BP 110/74 | HR 95 | Resp 16 | Ht 63.0 in | Wt 175.0 lb

## 2017-04-07 DIAGNOSIS — M8580 Other specified disorders of bone density and structure, unspecified site: Secondary | ICD-10-CM | POA: Diagnosis not present

## 2017-04-07 DIAGNOSIS — Z96651 Presence of right artificial knee joint: Secondary | ICD-10-CM

## 2017-04-07 DIAGNOSIS — G8929 Other chronic pain: Secondary | ICD-10-CM | POA: Diagnosis not present

## 2017-04-07 DIAGNOSIS — M47819 Spondylosis without myelopathy or radiculopathy, site unspecified: Secondary | ICD-10-CM | POA: Diagnosis not present

## 2017-04-07 DIAGNOSIS — I10 Essential (primary) hypertension: Secondary | ICD-10-CM | POA: Diagnosis not present

## 2017-04-07 DIAGNOSIS — Z471 Aftercare following joint replacement surgery: Secondary | ICD-10-CM | POA: Diagnosis not present

## 2017-04-07 MED ORDER — METHOCARBAMOL 500 MG PO TABS: ORAL_TABLET | ORAL | 0 refills | Status: DC

## 2017-04-07 MED ORDER — OXYCODONE HCL 5 MG PO TABS: 5.0000 mg | ORAL_TABLET | Freq: Three times a day (TID) | ORAL | 0 refills | Status: DC | PRN

## 2017-04-07 NOTE — Progress Notes (Signed)
Office Visit Note   Patient: [name]           Date of Birth: [birth year]           MRN: [id number] Visit Date: 04/07/2017              Requested by: Hoyt Koch, MD [place], [place] [id number] PCP: Hoyt Koch, MD   Assessment & Plan: Visit Diagnoses:  1. History of total right knee replacement     Plan: Office 2 weeks..Stop xarelto. Work on Metall[employer]. Outpatient physical therapy in [place]. Weightbearing as tolerated with walker or cane  Follow-Up Instructions: Return in about 2 weeks (around 04/21/2017).   Orders:  Orders Placed This Encounter  Procedures  . XR KNEE 3 VIEW RIGHT   No orders of the defined types were placed in this encounter.     Procedures: No procedures performed   Clinical Data: No additional findings.   Subjective: Chief Complaint  Patient presents with  . Right Knee - Routine Post Op    [name] is a 66 y o S/P 2 1/2 weeks R TKA. She relates she has soreness in the right knee and still uses the CPM machine. Ambulates with a walker    HPI  Review of Systems  Constitutional: Positive for fatigue. Negative for chills and fever.  Eyes: Negative for itching.  Respiratory: Negative for chest tightness and shortness of breath.   Cardiovascular: Negative for chest pain, palpitations and leg swelling.  Gastrointestinal: Negative for blood in stool, constipation and diarrhea.  Endocrine: Negative for polyuria.  Genitourinary: Negative for dysuria.  Musculoskeletal: Negative for back pain, joint swelling, neck pain and neck stiffness.  Allergic/Immunologic: Negative for immunocompromised state.  Neurological: Positive for weakness and numbness. Negative for dizziness.  Hematological: Does not bruise/bleed easily.  Psychiatric/Behavioral: The patient is not nervous/anxious.      Objective: Vital Signs: BP 110/74   Pulse 95   Resp 16   Ht 5' 3"  (1.6 m)   Wt 175 lb (79.4 kg)    LMP 02/09/2005   BMI 31.00 kg/m   Physical Exam  Ortho Exam right knee incision clean and dry. Staples removed and Steri-Strips applied. No calf pain. No distal edema. Neurovascular exam intact. Full extension and about 90 flexion. No instability  Specialty Comments:  No specialty comments available.  Imaging: No results found.   PMFS History: Patient Active Problem List   Diagnosis Date Noted  . S/P TKR (total knee replacement) using cement, right 03/21/2017  . Primary osteoarthritis of right knee 11/02/2016  . Nausea without vomiting 09/01/2016  . Chronic diarrhea 12/07/2014  . Segmental colitis with rectal bleeding ([place]) 09/12/2014  . Routine general medical examination at a health care facility 06/05/2014  . Overweight 06/13/2011  . ABNORMAL CHEST XRAY 09/30/2007  . Hyperlipidemia 02/17/2007  . Essential hypertension 01/08/2007  . Osteoarthritis 01/08/2007   Past Medical History:  Diagnosis Date  . Arthritis    back, fingers with joint pain and swelling.  chronic back pain  . Chronic back pain    arthritis   . Diverticulosis   . Elevated cholesterol    takes Niacin daily and Simvastatin  . GERD (gastroesophageal reflux disease) 06/2004   non-specific gastritis on EGD 06/2004  . Headache(784.0)    occasionally  . History of colon polyps 2004, 2009   2004:adenomatous. 2009 hyperplastic.   Marland Kitchen History of hiatal hernia   . Hypertension    takes  Tenoretic and Lisinopril daily  . Mucoid cyst of joint 08/2013   right index finger  . Osteopenia 01/2014   T score -1.1 FRAX 14%/0.5%. Stable from prior DEXA  . PONV (postoperative nausea and vomiting)   . Seasonal allergies   . Stroke ([facility] [place]) 1998   x 2 - mild left-sided weakness  . Urge incontinence   . Uterine prolapse     Family History  Problem Relation Age of Onset  . Hypertension Mother   . Heart disease Mother   . Diabetes Father   . Hypertension Father   . Heart disease Father   . Stroke Father   .  Diabetes Maternal Aunt   . Diabetes Paternal Grandmother   . Hypertension Paternal Grandfather   . Stroke Paternal Grandfather     Past Surgical History:  Procedure Laterality Date  . BACK SURGERY     x 2  . COLONOSCOPY  2004, 2009, 2014  . FINGER SURGERY    . GUM SURGERY    . GYNECOLOGIC CRYOSURGERY    . HYSTEROSCOPY W/D&C  12/07/2010   with resection of endometrial polyp  . KNEE ARTHROSCOPY Left 2008  . lip biopsy     done at MD office Fri 11/22/13  . MASS EXCISION Right 08/15/2013   Procedure: RIGHT INDEX EXCISION MASS ;  Surgeon: Tennis Must, MD;  Location: [facility];  Service: Orthopedics;  Laterality: Right;  . NASAL SEPTUM SURGERY    . OOPHORECTOMY Right 2000  . SHOULDER ARTHROSCOPY WITH OPEN ROTATOR CUFF REPAIR AND DISTAL CLAVICLE ACROMINECTOMY Right 11/26/2013   Procedure: RIGHT SHOULDER ARTHROSCOPY WITH MINI OPEN ROTATOR CUFF REPAIR AND DISTAL CLAVICLE RESECTION, SUBACROMIAL DECOMPRESSION, POSSIBLE [facility] PATCH.;  Surgeon: [name], MD;  Location: [facility];  Service: Orthopedics;  Laterality: Right;  . TOTAL KNEE ARTHROPLASTY Right 03/21/2017  . TOTAL KNEE ARTHROPLASTY Right 03/21/2017   Procedure: RIGHT TOTAL KNEE ARTHROPLASTY;  Surgeon: [name], MD;  Location: [facility];  Service: Orthopedics;  Laterality: Right;  . TUBAL LIGATION     Social History   Occupational History  . Occupation: Animal nutritionist    Comment: Disability/Retired  Tobacco Use  . Smoking status: Passive Smoke Exposure - Never Smoker  . Smokeless tobacco: Never Used  Substance and Sexual Activity  . Alcohol use: No    Alcohol/week: 0.0 oz    Frequency: Never  . Drug use: No  . Sexual activity: Yes    Partners: Male    Birth control/protection: Surgical, Post-menopausal    Comment: BTL-1st intercourse 66 yo-More than 5 partners

## 2017-04-12 ENCOUNTER — Ambulatory Visit: Payer: Medicare Other | Attending: Orthopaedic Surgery

## 2017-04-12 DIAGNOSIS — M25561 Pain in right knee: Secondary | ICD-10-CM | POA: Insufficient documentation

## 2017-04-12 DIAGNOSIS — M25661 Stiffness of right knee, not elsewhere classified: Secondary | ICD-10-CM | POA: Insufficient documentation

## 2017-04-12 DIAGNOSIS — M6281 Muscle weakness (generalized): Secondary | ICD-10-CM | POA: Insufficient documentation

## 2017-04-12 DIAGNOSIS — G8929 Other chronic pain: Secondary | ICD-10-CM | POA: Insufficient documentation

## 2017-04-12 DIAGNOSIS — R262 Difficulty in walking, not elsewhere classified: Secondary | ICD-10-CM | POA: Insufficient documentation

## 2017-04-12 NOTE — Patient Instructions (Addendum)
  Seated knee flexion/extension AAROM  Sitting on a chair or on your bed   Gently roll the ball towards you with your foot to feel a comfortable stretch bending at your knee. Hold for 5 seconds.    Gently roll the ball out to feel an extension stretch at your knee. Hold for 5 seconds.    Keep your thigh from wobbling.     Repeat 10 times.   Perform 3 sets daily .    Standing knee extension  Standing and holding onto your walker for support   Tighten the muscles in front of your thigh to straighten your knee.    Hold for 5 seconds.    Repeat 10 times.    Perform 3 sets daily.

## 2017-04-12 NOTE — Therapy (Signed)
[place] [facility] [address], [place], [address] Phone: [phone]   Fax:  [phone]  Physical Therapy Evaluation  Patient Details  Name: [name] MRN: [id number] Date of Birth: 1951/03/24 Referring Provider: Joni Fears, MD   Encounter Date: 04/12/2017  PT End of Session - 04/12/17 0853    Visit Number  1    Number of Visits  13    Date for PT Re-Evaluation  05/25/17    PT Start Time  0902    PT Stop Time  1004    PT Time Calculation (min)  62 min    Equipment Utilized During Treatment  -- pt rw    Activity Tolerance  Patient tolerated treatment well    Behavior During Therapy  [facility] for tasks assessed/performed       Past Medical History:  Diagnosis Date  . Arthritis    back, fingers with joint pain and swelling.  chronic back pain  . Chronic back pain    arthritis   . Diverticulosis   . Elevated cholesterol    takes Niacin daily and Simvastatin  . GERD (gastroesophageal reflux disease) 06/2004   non-specific gastritis on EGD 06/2004  . Headache(784.0)    occasionally  . History of colon polyps 2004, 2009   2004:adenomatous. 2009 hyperplastic.   Marland Kitchen History of hiatal hernia   . Hypertension    takes Tenoretic and Lisinopril daily  . Mucoid cyst of joint 08/2013   right index finger  . Osteopenia 01/2014   T score -1.1 FRAX 14%/0.5%. Stable from prior DEXA  . PONV (postoperative nausea and vomiting)   . Seasonal allergies   . Stroke ([place]) 1998   x 2 - mild left-sided weakness  . Urge incontinence   . Uterine prolapse     Past Surgical History:  Procedure Laterality Date  . BACK SURGERY     x 2  . COLONOSCOPY  2004, 2009, 2014  . FINGER SURGERY    . GUM SURGERY    . GYNECOLOGIC CRYOSURGERY    . HYSTEROSCOPY W/D&C  12/07/2010   with resection of endometrial polyp  . KNEE ARTHROSCOPY Left 2008  . lip biopsy     done at MD office Fri 11/22/13  . MASS EXCISION Right 08/15/2013   Procedure: RIGHT INDEX EXCISION MASS ;  Surgeon: Tennis Must, MD;  Location: [place];  Service: Orthopedics;  Laterality: Right;  . NASAL SEPTUM SURGERY    . OOPHORECTOMY Right 2000  . SHOULDER ARTHROSCOPY WITH OPEN ROTATOR CUFF REPAIR AND DISTAL CLAVICLE ACROMINECTOMY Right 11/26/2013   Procedure: RIGHT SHOULDER ARTHROSCOPY WITH MINI OPEN ROTATOR CUFF REPAIR AND DISTAL CLAVICLE RESECTION, SUBACROMIAL DECOMPRESSION, POSSIBLE [facility] PATCH.;  Surgeon: [name], MD;  Location: [place];  Service: Orthopedics;  Laterality: Right;  . TOTAL KNEE ARTHROPLASTY Right 03/21/2017  . TOTAL KNEE ARTHROPLASTY Right 03/21/2017   Procedure: RIGHT TOTAL KNEE ARTHROPLASTY;  Surgeon: [name], MD;  Location: [place];  Service: Orthopedics;  Laterality: Right;  . TUBAL LIGATION      There were no vitals filed for this visit.   Subjective Assessment - 04/12/17 0907    Subjective  R knee pain: 5/10 R knee pain currently, 9/10 R knee pain at worst for the past 7 days.     Pertinent History  S/P R TKA on 03/21/2017. Pt had osteoarthritis and it was "bone on bone." Had 5 home health PT visits, and  a little bit of PT at the hospital. R knee hurts currently and feels tight.  PLOF: pt was using a SPC at times when her knee was acting up real bad but did not have to use her SPC all the time.      Patient Stated Goals  I just want to be able to get back to normal. (Has 5 dogs she has to take care of, do chores around the house, be active).     Currently in Pain?  Yes    Pain Score  5     Pain Location  Knee    Pain Orientation  Right    Pain Descriptors / Indicators  Tightness;Aching;Sharp    Pain Type  Surgical pain;Chronic pain    Pain Frequency  Constant    Aggravating Factors   Bending, walking, begining to move    Pain Relieving Factors  more movement         [facility] PT Assessment - 04/12/17 0843      Assessment   Medical Diagnosis  R total knee replacement     Referring  Provider  Joni Fears, MD    Onset Date/Surgical Date  03/21/17    Prior Therapy  Pt participated in acute care PT      Precautions   Precaution Comments  knee      Restrictions   Other Position/Activity Restrictions  WBAT per MD note      Balance Screen   Has the patient fallen in the past 6 months  Yes    How many times?  -- no number provided in medical screening    Has the patient had a decrease in activity level because of a fear of falling?   No pt states fear of falling    Is the patient reluctant to leave their home because of a fear of falling?   No pt states fear of falling      Home Environment   Additional Comments  Pt lives in a 1 story home with her husband. Has a basement, 20 steps with R rail. 3 steps to enter home, no rails.       Prior Function   Vocation  On disability 1998 stroke    Vocation Requirements  PLOF: independent ambulation with occasional use of SPC when her knee bothers her a lot      Observation/Other Assessments   Observations  Surgical incision healing satisfactorily. Steri strips present.     Lower Extremity Functional Scale   15/80      Posture/Postural Control   Posture Comments  bilaterally protracted shoulders and neck, decreased lumbar lordosis, slight L weight shift       AROM   Right Knee Extension  -25 -10 degrees in supine quad set position    Right Knee Flexion  77 80 AAROM    Left Knee Extension  -- WFL    Left Knee Flexion  -- [facility]      Strength   Overall Strength Comments  R knee strength not yet tested to allow for more healing    Left Knee Flexion  4+/5    Left Knee Extension  5/5      Palpation   Palpation comment  swelling R knee, soft tissue stiffness around scar tissue/surgical incision area. TTP medial R knee. Slight decreased sensation to light touch R lateral knee area.       Transfers   Comments  Mod I with sit <> stand but with difficulty  due to pain and decreased R knee flexion ROM. Mod A with supine <> sit  secondary to R knee pain.       Ambulation/Gait   Gait Comments  Antalgic, decreased stance R LE, decreased R LE swing phase, decreased bilateral hip extension, forward flexed. Uses rw.              Objective measurements completed on examination: See above findings.   Surgical incision healing satisfactorily. Steri strips present.  Manual therapy  Seated STM peri scar tissue/incision area to decrease stiffness  Seated R knee flexion AROM improved to 80 degrees  Supine STM to hamstrings, R leg propped into extension with pillow.   Ther-ex seated R knee flexion/extension AAROM with ball 10x2 with 5 seconds each  supine quad set 10x2 with 5 second holds  -17 degrees seated R knee extension AROM afterwards.    Standing TKE with bilateral UE assist on rw 10x2 with 5 second holds   Reviewed HEP. Pt demonstrated and verbalized understanding. Handout provided.     Improved exercise technique, movement at target joints, use of target muscles after min to mod verbal, visual, tactile cues.    Patient is a 67 year old female who came to physical therapy S/P R TKA on 03/21/2017. She also presents with R knee pain, altered gait pattern and posture, decreased soft tissue mobility, swelling, weakness, TTP, and difficulty performing functional tasks such as walking and performing chores. Pt will benefit from skilled PT services to address the aforementioned deficits.    PT Education - 04/12/17 1049    Education provided  Yes    Education Details  ther-ex, HEP, plan of care    Person(s) Educated  Patient    Methods  Explanation;Demonstration;Tactile cues;Verbal cues;Handout    Comprehension  Returned demonstration;Verbalized understanding          PT Long Term Goals - 04/12/17 1017      PT LONG TERM GOAL #1   Title  Patient will be able to ambulate at least 500 ft without use of AD and no LOB to promote mobility.     Baseline  Pt currently ambulates with rw (04/12/2017)    Time   6    Period  Weeks    Status  New    Target Date  05/25/17      PT LONG TERM GOAL #2   Title  Pt will improve seated R knee extension AROM to -8 degrees or less to promote ability to ambulate, perform standing tasks.     Baseline  seated R knee extension AROM -25 degrees (04/12/2017)    Time  6    Period  Weeks    Status  New    Target Date  05/25/17      PT LONG TERM GOAL #3   Title  Pt will improve seated R knee flexion AROM to at least 115 degrees to promote ability to ambulate, negiotiate steps, perform transfers with less difficulty.     Baseline  seated R knee flexion AROM 77 degrees (04/12/2017)    Time  6    Period  Weeks    Status  New    Target Date  05/25/17      PT LONG TERM GOAL #4   Title  Pt will improve R knee flexion strength to at least 4+/5 and R knee extension strength to at least 5/5 to promote ability to ambulate, perform chores.     Baseline  MMT not yet  performed to allow for more healing. (04/12/2017)    Time  6    Period  Weeks    Status  New    Target Date  05/25/17      PT LONG TERM GOAL #5   Title  Patient will improve her LEFS score by at least 9 points as a demonstration of improved function.    Baseline  15/80 (04/12/2017)    Time  6    Period  Weeks    Status  New    Target Date  05/25/17             Plan - 04/12/17 1011    Clinical Impression Statement  Patient is a 67 year old female who came to physical therapy S/P R TKA on 03/21/2017. She also presents with R knee pain, altered gait pattern and posture, decreased soft tissue mobility; swelling, weakness, TTP, and difficulty performing functional tasks such as walking and performing chores. Pt will benefit from skilled PT services to address the aforementioned deficits.    History and Personal Factors relevant to plan of care:  S/P R TKA on 03/21/2017, pain, swelling, difficulty walking, performing transfers    Clinical Presentation  Stable    Clinical Presentation due to:  symtpoms are  better per medical intake form    Clinical Decision Making  Low    Rehab Potential  Fair    Clinical Impairments Affecting Rehab Potential  (-) healing time, pain; (+) husband support    PT Frequency  2x / week    PT Duration  6 weeks    PT Treatment/Interventions  Neuromuscular re-education;Therapeutic exercise;Therapeutic activities;Manual techniques;Aquatic Therapy;Electrical Stimulation;Iontophoresis 89m/ml Dexamethasone;Gait training;Stair training;Balance training;Functional mobility training;Patient/family education;Scar mobilization;Passive range of motion;Dry needling    PT Next Visit Plan  ROM, gait, function, manual techniques, modalities PRN    Consulted and Agree with Plan of Care  Patient       Patient will benefit from skilled therapeutic intervention in order to improve the following deficits and impairments:  Pain, Difficulty walking, Decreased strength, Decreased scar mobility, Decreased range of motion  Visit Diagnosis: Chronic pain of right knee - Plan: PT plan of care cert/re-cert  Muscle weakness (generalized) - Plan: PT plan of care cert/re-cert  Difficulty in walking, not elsewhere classified - Plan: PT plan of care cert/re-cert     Problem List Patient Active Problem List   Diagnosis Date Noted  . S/P TKR (total knee replacement) using cement, right 03/21/2017  . Primary osteoarthritis of right knee 11/02/2016  . Nausea without vomiting 09/01/2016  . Chronic diarrhea 12/07/2014  . [place] 09/12/[place]  . Routine general medical examination at a health care facility 06/05/2014  . Overweight 06/13/2011  . ABNORMAL CHEST XRAY 09/30/2007  . Hyperlipidemia 02/17/2007  . Essential hypertension 01/08/2007  . Osteoarthritis 01/08/2007    MJoneen BoersPT, DPT   04/12/2017, 10:59 AM  [facility] [address] [place] [address]Phone: [phone]   Fax:  [phone] Name: NJULIEANNE HADSALLMRN: [id number] of Birth: 3Jul 17, 1952

## 2017-04-17 ENCOUNTER — Encounter: Payer: Self-pay | Admitting: Physical Therapy

## 2017-04-17 ENCOUNTER — Ambulatory Visit: Payer: Medicare Other | Admitting: Physical Therapy

## 2017-04-17 DIAGNOSIS — G8929 Other chronic pain: Secondary | ICD-10-CM | POA: Diagnosis not present

## 2017-04-17 DIAGNOSIS — M6281 Muscle weakness (generalized): Secondary | ICD-10-CM

## 2017-04-17 DIAGNOSIS — M25661 Stiffness of right knee, not elsewhere classified: Secondary | ICD-10-CM | POA: Diagnosis not present

## 2017-04-17 DIAGNOSIS — M25561 Pain in right knee: Principal | ICD-10-CM

## 2017-04-17 DIAGNOSIS — R262 Difficulty in walking, not elsewhere classified: Secondary | ICD-10-CM | POA: Diagnosis not present

## 2017-04-17 NOTE — Therapy (Signed)
[place] [facility] [address], [place], [address] Phone: [phone]   Fax:  [phone]  Physical Therapy Treatment  Patient Details  Name: [name] MRN: [id number] Date of Birth: 09/07/1950 Referring Provider: Joni Fears, MD   Encounter Date: 04/17/2017  PT End of Session - 04/17/17 1315    Visit Number  2    Number of Visits  13    Date for PT Re-Evaluation  05/25/17    PT Start Time  1307    PT Stop Time  1351    PT Time Calculation (min)  44 min    Equipment Utilized During Treatment  -- pt rw    Activity Tolerance  Patient tolerated treatment well    Behavior During Therapy  [facility] for tasks assessed/performed       Past Medical History:  Diagnosis Date  . Arthritis    back, fingers with joint pain and swelling.  chronic back pain  . Chronic back pain    arthritis   . Diverticulosis   . Elevated cholesterol    takes Niacin daily and Simvastatin  . GERD (gastroesophageal reflux disease) 06/2004   non-specific gastritis on EGD 06/2004  . Headache(784.0)    occasionally  . History of colon polyps 2004, 2009   2004:adenomatous. 2009 hyperplastic.   Marland Kitchen History of hiatal hernia   . Hypertension    takes Tenoretic and Lisinopril daily  . Mucoid cyst of joint 08/2013   right index finger  . Osteopenia 01/2014   T score -1.1 FRAX 14%/0.5%. Stable from prior DEXA  . PONV (postoperative nausea and vomiting)   . Seasonal allergies   . Stroke ([place]) 1998   x 2 - mild left-sided weakness  . Urge incontinence   . Uterine prolapse     Past Surgical History:  Procedure Laterality Date  . BACK SURGERY     x 2  . COLONOSCOPY  2004, 2009, 2014  . FINGER SURGERY    . GUM SURGERY    . GYNECOLOGIC CRYOSURGERY    . HYSTEROSCOPY W/D&C  12/07/2010   with resection of endometrial polyp  . KNEE ARTHROSCOPY Left 2008  . lip biopsy     done at MD office Fri 11/22/13  . MASS EXCISION Right 08/15/2013   Procedure: RIGHT INDEX EXCISION MASS ;  Surgeon: Tennis Must, MD;  Location: [place];  Service: Orthopedics;  Laterality: Right;  . NASAL SEPTUM SURGERY    . OOPHORECTOMY Right 2000  . SHOULDER ARTHROSCOPY WITH OPEN ROTATOR CUFF REPAIR AND DISTAL CLAVICLE ACROMINECTOMY Right 11/26/2013   Procedure: RIGHT SHOULDER ARTHROSCOPY WITH MINI OPEN ROTATOR CUFF REPAIR AND DISTAL CLAVICLE RESECTION, SUBACROMIAL DECOMPRESSION, POSSIBLE [facility] PATCH.;  Surgeon: [name], MD;  Location: [place];  Service: Orthopedics;  Laterality: Right;  . TOTAL KNEE ARTHROPLASTY Right 03/21/2017  . TOTAL KNEE ARTHROPLASTY Right 03/21/2017   Procedure: RIGHT TOTAL KNEE ARTHROPLASTY;  Surgeon: [name], MD;  Location: [place];  Service: Orthopedics;  Laterality: Right;  . TUBAL LIGATION      There were no vitals filed for this visit.  Subjective Assessment - 04/17/17 1305    Subjective  Patient reports stiffness right knee and feels improvement with exercises. She reports feeling like a tight band is around her knee.     Pertinent History  S/P R TKA on 03/21/2017. Pt had osteoarthritis and it was "bone on bone." Had 5 home health PT visits,  and a little bit of PT at the hospital. R knee hurts currently and feels tight.  PLOF: pt was using a SPC at times when her knee was acting up real bad but did not have to use her SPC all the time.      Patient Stated Goals  I just want to be able to get back to normal. (Has 5 dogs she has to take care of, do chores around the house, be active).     Currently in Pain?  Yes    Pain Score  5     Pain Location  Knee    Pain Orientation  Right    Pain Descriptors / Indicators  Aching;Tightness    Pain Type  Surgical pain;Acute pain 03/21/2017    Pain Onset  1 to 4 weeks ago    Pain Frequency  Constant       Objective: Ambulating into clinic using rolling walker and carrying SPC in hand; forward leaning trunk, short step length bilateral AAROM  right knee flexion 10-95 degrees seated at edge of treatment table Palpation: increased warmth with tenderness along medial aspect right knee  Treatment:  Manual therapy: 13 min.: goal improve soft tissue elasticity, improve ROM right knee; improve gait pattern STM performed to right LE quadriceps, hamstrings with superficial techniques, scar mobilization right knee, patella mobilization glides all directions  Therapeutic exercise: patient performed with demonstration, instruction, verbal and tactile cues of therapist:  Seated at edge of treatment table:  MAI for hamstrings 30/60/90 degrees flexion x 3 sets SAQ with patient seated and support of therapist x 10 reps Standing hip abduction and extension 3 reps each x 3 sets alternating each LE with standing close supervision and using UE for support Standing sway forward and back and side to side x 2 min. While on balance pad with UE support for safety Diagonal wedding march x 3 sets; 3-4 steps with VC and UE support for balance/safety  Ice pack applied to right knee with LE supported on pillow x 8 min. At end of session: no adverse reactions noted  patient response to treatment:  Improved ROM to 100 degrees folloiwng STM with improved soft tissue elasticity; pain on medial aspect right knee, decreased warmth following ice pack and improved hip/knee flexion with swing phase at end of session/ improved quad control with SAQ and improved motor control with all exercise with repetition and VC    PT Education - 04/17/17 1318    Education provided  Yes    Education Details  exercise insruction for correct technique    Person(s) Educated  Patient    Methods  Explanation;Demonstration;Verbal cues;Handout    Comprehension  Verbalized understanding;Returned demonstration;Verbal cues required          PT Long Term Goals - 04/12/17 1017      PT LONG TERM GOAL #1   Title  Patient will be able to ambulate at least 500 ft without use of AD and no  LOB to promote mobility.     Baseline  Pt currently ambulates with rw (04/12/2017)    Time  6    Period  Weeks    Status  New    Target Date  05/25/17      PT LONG TERM GOAL #2   Title  Pt will improve seated R knee extension AROM to -8 degrees or less to promote ability to ambulate, perform standing tasks.     Baseline  seated R knee extension AROM -25 degrees (  04/12/2017)    Time  6    Period  Weeks    Status  New    Target Date  05/25/17      PT LONG TERM GOAL #3   Title  Pt will improve seated R knee flexion AROM to at least 115 degrees to promote ability to ambulate, negiotiate steps, perform transfers with less difficulty.     Baseline  seated R knee flexion AROM 77 degrees (04/12/2017)    Time  6    Period  Weeks    Status  New    Target Date  05/25/17      PT LONG TERM GOAL #4   Title  Pt will improve R knee flexion strength to at least 4+/5 and R knee extension strength to at least 5/5 to promote ability to ambulate, perform chores.     Baseline  MMT not yet performed to allow for more healing. (04/12/2017)    Time  6    Period  Weeks    Status  New    Target Date  05/25/17      PT LONG TERM GOAL #5   Title  Patient will improve her LEFS score by at least 9 points as a demonstration of improved function.    Baseline  15/80 (04/12/2017)    Time  6    Period  Weeks    Status  New    Target Date  05/25/17            Plan - 04/17/17 1434    Clinical Impression Statement  Patient demonstrates good progress towards goals with Improved ROM to 100 degrees knee flexion following STM. Improved gait pattern with  improved hip/knee flexion with swing phase at end of session. She should continue to progress with ROM/strength with additional physical therapy intervention.    Rehab Potential  Fair    Clinical Impairments Affecting Rehab Potential  (-) healing time, pain; (+) husband support    PT Frequency  2x / week    PT Duration  6 weeks    PT Treatment/Interventions   Neuromuscular re-education;Therapeutic exercise;Therapeutic activities;Manual techniques;Aquatic Therapy;Electrical Stimulation;Iontophoresis 27m/ml Dexamethasone;Gait training;Stair training;Balance training;Functional mobility training;Patient/family education;Scar mobilization;Passive range of motion;Dry needling    PT Next Visit Plan  ROM, gait, function, manual techniques, modalities PRN    PT Home Exercise Plan  ROM, standing hip abduction/extension; quad sets seated, supine; elevation/ use of ice for swelling/pain    Consulted and Agree with Plan of Care  --       Patient will benefit from skilled therapeutic intervention in order to improve the following deficits and impairments:  Pain, Difficulty walking, Decreased strength, Decreased scar mobility, Decreased range of motion  Visit Diagnosis: Chronic pain of right knee  Muscle weakness (generalized)  Difficulty in walking, not elsewhere classified  Stiffness of right knee, not elsewhere classified     Problem List Patient Active Problem List   Diagnosis Date Noted  . S/P TKR (total knee replacement) using cement, right 03/21/2017  . Primary osteoarthritis of right knee 11/02/2016  . Nausea without vomiting 09/01/2016  . Chronic diarrhea 12/07/2014  . [place] 09/12/2014  . Routine general medical examination at a health care facility 06/05/2014  . Overweight 06/13/2011  . ABNORMAL CHEST XRAY 09/30/2007  . Hyperlipidemia 02/17/2007  . Essential hypertension 01/08/2007  . Osteoarthritis 01/08/2007    BJomarie LongsPT 04/18/2017, 2:34 PM  [facility] [address] [place], [address] Phone: [phone]   Fax:  [phone]  Name: [name] MRN: [id number] Date of Birth: 02-15-1951

## 2017-04-19 ENCOUNTER — Encounter: Payer: Self-pay | Admitting: Physical Therapy

## 2017-04-19 ENCOUNTER — Ambulatory Visit: Payer: Medicare Other | Admitting: Physical Therapy

## 2017-04-19 DIAGNOSIS — M25661 Stiffness of right knee, not elsewhere classified: Secondary | ICD-10-CM | POA: Diagnosis not present

## 2017-04-19 DIAGNOSIS — M6281 Muscle weakness (generalized): Secondary | ICD-10-CM

## 2017-04-19 DIAGNOSIS — G8929 Other chronic pain: Secondary | ICD-10-CM

## 2017-04-19 DIAGNOSIS — M25561 Pain in right knee: Principal | ICD-10-CM

## 2017-04-19 DIAGNOSIS — R262 Difficulty in walking, not elsewhere classified: Secondary | ICD-10-CM

## 2017-04-19 NOTE — Therapy (Signed)
[facility] [address], [place], [address] Phone: [phone]   Fax:  [phone]  Physical Therapy Treatment  Patient Details  Name: [name] MRN: [id number] Date of Birth: [birth year] Referring Provider: Joni Fears, MD   Encounter Date: 04/19/2017  PT End of Session - 04/19/17 1113    Visit Number  3    Number of Visits  13    Date for PT Re-Evaluation  05/25/17    PT Start Time  1107    PT Stop Time  1210    PT Time Calculation (min)  63 min    Equipment Utilized During Treatment  -- pt rw    Activity Tolerance  Patient tolerated treatment well    Behavior During Therapy  [facility] for tasks assessed/performed       Past Medical History:  Diagnosis Date  . Arthritis    back, fingers with joint pain and swelling.  chronic back pain  . Chronic back pain    arthritis   . Diverticulosis   . Elevated cholesterol    takes Niacin daily and Simvastatin  . GERD (gastroesophageal reflux disease) 06/2004   non-specific gastritis on EGD 06/2004  . Headache(784.0)    occasionally  . History of colon polyps 2004, 2009   2004:adenomatous. 2009 hyperplastic.   Marland Kitchen History of hiatal hernia   . Hypertension    takes Tenoretic and Lisinopril daily  . Mucoid cyst of joint 08/2013   right index finger  . Osteopenia 01/2014   T score -1.1 FRAX 14%/0.5%. Stable from prior DEXA  . PONV (postoperative nausea and vomiting)   . Seasonal allergies   . Stroke ([place]) 1998   x 2 - mild left-sided weakness  . Urge incontinence   . Uterine prolapse     Past Surgical History:  Procedure Laterality Date  . BACK SURGERY     x 2  . COLONOSCOPY  2004, 2009, 2014  . FINGER SURGERY    . GUM SURGERY    . GYNECOLOGIC CRYOSURGERY    . HYSTEROSCOPY W/D&C  12/07/2010   with resection of endometrial polyp  . KNEE ARTHROSCOPY Left 2008  . lip biopsy     done at MD office Fri 11/22/13  . MASS EXCISION Right 08/15/2013   Procedure: RIGHT INDEX EXCISION MASS ;  Surgeon: Tennis Must, MD;  Location: [facility];  Service: Orthopedics;  Laterality: Right;  . NASAL SEPTUM SURGERY    . OOPHORECTOMY Right 2000  . SHOULDER ARTHROSCOPY WITH OPEN ROTATOR CUFF REPAIR AND DISTAL CLAVICLE ACROMINECTOMY Right 11/26/2013   Procedure: RIGHT SHOULDER ARTHROSCOPY WITH MINI OPEN ROTATOR CUFF REPAIR AND DISTAL CLAVICLE RESECTION, SUBACROMIAL DECOMPRESSION, POSSIBLE [facility] PATCH.;  Surgeon: [name], MD;  Location: [facility];  Service: Orthopedics;  Laterality: Right;  . TOTAL KNEE ARTHROPLASTY Right 03/21/2017  . TOTAL KNEE ARTHROPLASTY Right 03/21/2017   Procedure: RIGHT TOTAL KNEE ARTHROPLASTY;  Surgeon: [name], MD;  Location: [facility];  Service: Orthopedics;  Laterality: Right;  . TUBAL LIGATION      There were no vitals filed for this visit.  Subjective Assessment - 04/19/17 1109    Subjective  Patient reports she is walking at home with cane and feels the tight band around her knee at end of the day.  She continues with pain medial aspect of her knee that limits ability to walk.    Pertinent History  S/P R TKA on 03/21/2017.  Pt had osteoarthritis and it was "bone on bone." Had 5 home health PT visits, and a little bit of PT at the hospital. R knee hurts currently and feels tight.  PLOF: pt was using a SPC at times when her knee was acting up real bad but did not have to use her SPC all the time.      Limitations  Walking;Standing;House hold activities;Sitting    Patient Stated Goals  I just want to be able to get back to normal. (Has 5 dogs she has to take care of, do chores around the house, be active).     Currently in Pain?  Yes    Pain Score  7     Pain Orientation  Right    Pain Descriptors / Indicators  Aching;Tightness    Pain Type  Surgical pain;Acute pain 03/21/2017    Pain Onset  1 to 4 weeks ago    Pain Frequency  Constant       Objective: Ambulating into clinic using rolling  walker:;  forward leaning trunk, short step length bilateral AAROM right knee flexion 0 - 90 degrees and up to 95  degrees at end of session seated at edge of treatment table Palpation: increased warmth with tenderness along medial aspect right knee  Treatment:  Manual therapy: 10 min.: goal improve soft tissue elasticity, improve ROM right knee; improve gait pattern STM performed to right LE quadriceps, hamstrings with superficial techniques, scar mobilization right knee, patella mobilization glides all directions  Therapeutic exercise: patient performed with demonstration, instruction, verbal and tactile cues of therapist:  Seated at edge of treatment table:  MAI for hamstrings 30/60/90 degrees flexion x 3 sets SAQ with estim. With 2# weight on ankle 2-3 reps each cycle Diagonal wedding march x 2 sets; 3-4 steps with VC and UE support for balance/safety Walk forward and backwards x 3 sets along counter for safety/support  Modalities: Electrical stimulation: 20 min. Russian stim. 10/10 cycle applied (2) electrodes to quadriceps/VMO LE with patient reclined with LE supported on pillow; pt. Performing quad sets/knee extension with each cycle; goal muscle re education High volt estim.clincial program for muscle spasms  (2) electrodes applied to medial and lateral aspect of right knee, intensity to tolerance with patient in reclined position with LE supported on pillow goal: pain, spasms Ice pack applied to right knee with LE supported on pillow during estim.: no adverse reactions noted  Taping performed to right knee medial aspect for pain/swelling; performed following modalities with patient supine with knee in flexion; instructed patient to monitor pain/swelling and take tape off following 2-3 days of wear   Patient response to treatment:  Improved pain level following estim/ice to 5/10 and continued with pain on weight bearing at end of session. ROM improved with repetition and exercise and  STM to right knee. Improved posture and cadence with walking with rolling walker with good heel/toe pattern, decreased hip extension right LE. Required instruction and demonstration with close supervision to perform walking exercises.       PT Education - 04/19/17 1112    Education provided  Yes    Education Details  exercise instruction for technique    Person(s) Educated  Patient    Methods  Demonstration;Verbal cues;Explanation    Comprehension  Verbalized understanding;Returned demonstration;Verbal cues required          PT Long Term Goals - 04/12/17 1017      PT LONG TERM GOAL #1   Title  Patient will be  able to ambulate at least 500 ft without use of AD and no LOB to promote mobility.     Baseline  Pt currently ambulates with rw (04/12/2017)    Time  6    Period  Weeks    Status  New    Target Date  05/25/17      PT LONG TERM GOAL #2   Title  Pt will improve seated R knee extension AROM to -8 degrees or less to promote ability to ambulate, perform standing tasks.     Baseline  seated R knee extension AROM -25 degrees (04/12/2017)    Time  6    Period  Weeks    Status  New    Target Date  05/25/17      PT LONG TERM GOAL #3   Title  Pt will improve seated R knee flexion AROM to at least 115 degrees to promote ability to ambulate, negiotiate steps, perform transfers with less difficulty.     Baseline  seated R knee flexion AROM 77 degrees (04/12/2017)    Time  6    Period  Weeks    Status  New    Target Date  05/25/17      PT LONG TERM GOAL #4   Title  Pt will improve R knee flexion strength to at least 4+/5 and R knee extension strength to at least 5/5 to promote ability to ambulate, perform chores.     Baseline  MMT not yet performed to allow for more healing. (04/12/2017)    Time  6    Period  Weeks    Status  New    Target Date  05/25/17      PT LONG TERM GOAL #5   Title  Patient will improve her LEFS score by at least 9 points as a demonstration of improved  function.    Baseline  15/80 (04/12/2017)    Time  6    Period  Weeks    Status  New    Target Date  05/25/17            Plan - 04/19/17 1114    Clinical Impression Statement  Patient demonstrates steady progress towards goals with improving ROM and decreasing warmth in right knee s/p 4 weeks TKA. She continues with pain and weakness as limiting factors to maximal function and will benefit from continued physical therapy intervention to achieve goals.    Rehab Potential  Fair    Clinical Impairments Affecting Rehab Potential  (-) healing time, pain; (+) husband support    PT Frequency  2x / week    PT Duration  6 weeks    PT Treatment/Interventions  Neuromuscular re-education;Therapeutic exercise;Therapeutic activities;Manual techniques;Aquatic Therapy;Electrical Stimulation;Iontophoresis 16m/ml Dexamethasone;Gait training;Stair training;Balance training;Functional mobility training;Patient/family education;Scar mobilization;Passive range of motion;Dry needling    PT Next Visit Plan  ROM, gait, function, manual techniques, modalities PRN    PT Home Exercise Plan  ROM, standing hip abduction/extension; quad sets seated, supine; elevation/ use of ice for swelling/pain       Patient will benefit from skilled therapeutic intervention in order to improve the following deficits and impairments:  Pain, Difficulty walking, Decreased strength, Decreased scar mobility, Decreased range of motion  Visit Diagnosis: Chronic pain of right knee  Muscle weakness (generalized)  Difficulty in walking, not elsewhere classified  Stiffness of right knee, not elsewhere classified     Problem List Patient Active Problem List   Diagnosis Date Noted  . 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 Primary osteoarthritis of right knee 11/02/2016  . Nausea without vomiting 09/01/2016  . Chronic diarrhea 12/07/2014  . Segmental colitis with rectal bleeding ([place]) 09/12/2014  .  Routine general medical examination at a health care facility 06/05/2014  . Overweight 06/13/2011  . ABNORMAL CHEST XRAY 09/30/2007  . Hyperlipidemia 02/17/2007  . Essential hypertension 01/08/2007  . Osteoarthritis 01/08/2007    Jomarie Longs PT 04/19/2017, 1:05 PM  [facility] [address], [place], [address] Phone: [phone]   Fax:  [phone]  Name: [name] MRN: [id number] Date of Birth: 1950/07/17

## 2017-04-21 ENCOUNTER — Ambulatory Visit (INDEPENDENT_AMBULATORY_CARE_PROVIDER_SITE_OTHER): Payer: Medicare Other | Admitting: Orthopaedic Surgery

## 2017-04-21 ENCOUNTER — Encounter (INDEPENDENT_AMBULATORY_CARE_PROVIDER_SITE_OTHER): Payer: Self-pay | Admitting: Orthopaedic Surgery

## 2017-04-21 ENCOUNTER — Other Ambulatory Visit: Payer: Self-pay

## 2017-04-21 VITALS — BP 113/84 | HR 89 | Ht 62.0 in

## 2017-04-21 DIAGNOSIS — Z96651 Presence of right artificial knee joint: Secondary | ICD-10-CM

## 2017-04-21 MED ORDER — DICLOFENAC SODIUM 1 % TD GEL: 4.0000 g | TRANSDERMAL | 3 refills | Status: DC | PRN

## 2017-04-21 NOTE — Progress Notes (Signed)
Office Visit Note   Patient: [name]           Date of Birth: 29-May-1950           MRN: [id number] Visit Date: 04/21/2017              Requested by: Hoyt Koch, MD [place], [place] [id number] PCP: Hoyt Koch, MD   Assessment & Plan: Visit Diagnoses:  1. History of total right knee replacement     Plan: Uses a rolling walker. Minimal discomfort no fever or chills. Does have some limited motion with full extension and about 94 of flexion. Needs to work with physical therapy and home exercises to increase motion. We'll see back in a month Biggest issue is going to be muscle weakness and strengthening over time Follow-Up Instructions: Return in about 1 month (around 05/22/2017).   Orders:  No orders of the defined types were placed in this encounter.  No orders of the defined types were placed in this encounter.     Procedures: No procedures performed   Clinical Data: No additional findings.   Subjective: Chief Complaint  Patient presents with  . Follow-up    R KNEE SURGERY FU STILL SORE BUT DOING THEARPY  No related fever or chills. He does go to physical therapy for range of motion exercises.  HPI  Review of Systems  Constitutional: Negative.   HENT: Negative.   Eyes: Negative.   Respiratory: Negative.   Cardiovascular: Negative.   Gastrointestinal: Positive for diarrhea.  Genitourinary: Negative.   Musculoskeletal: Positive for back pain.  Skin: Negative.   Neurological: Negative.   Psychiatric/Behavioral: Positive for sleep disturbance.     Objective: Vital Signs: BP 113/84 (BP Location: Left Arm, Patient Position: Sitting, Cuff Size: Normal)   Pulse 89   Ht 5' 2"  (1.575 m)   LMP 02/09/2005   BMI 32.01 kg/m   Physical Exam  Ortho Exam awake alert and oriented 3. Comfortable sitting. Full extension right knee. Flexed about 94 5. No instability. Incision healing without problem. No calf pain. Is  obviously weak with an extensor lag. Neurovascularly intact distally.  Specialty Comments:  No specialty comments available.  Imaging: No results found.   PMFS History: Patient Active Problem List   Diagnosis Date Noted  . S/P TKR (total knee replacement) using cement, right 03/21/2017  . Primary osteoarthritis of right knee 11/02/2016  . Nausea without vomiting 09/01/2016  . Chronic diarrhea 12/07/2014  . Segmental colitis with rectal bleeding ([place]) 09/12/2014  . Routine general medical examination at a health care facility 06/05/2014  . Overweight 06/13/2011  . ABNORMAL CHEST XRAY 09/30/2007  . Hyperlipidemia 02/17/2007  . Essential hypertension 01/08/2007  . Osteoarthritis 01/08/2007   Past Medical History:  Diagnosis Date  . Arthritis    back, fingers with joint pain and swelling.  chronic back pain  . Chronic back pain    arthritis   . Diverticulosis   . Elevated cholesterol    takes Niacin daily and Simvastatin  . GERD (gastroesophageal reflux disease) 06/2004   non-specific gastritis on EGD 06/2004  . Headache(784.0)    occasionally  . History of colon polyps 2004, 2009   2004:adenomatous. 2009 hyperplastic.   Marland Kitchen History of hiatal hernia   . Hypertension    takes Tenoretic and Lisinopril daily  . Mucoid cyst of joint 08/2013   right index finger  . Osteopenia 01/2014   T score -1.1 FRAX 14%/0.5%. Stable from  prior DEXA  . PONV (postoperative nausea and vomiting)   . Seasonal allergies   . Stroke ([place]) 1998   x 2 - mild left-sided weakness  . Urge incontinence   . Uterine prolapse     Family History  Problem Relation Age of Onset  . Hypertension Mother   . Heart disease Mother   . Diabetes Father   . Hypertension Father   . Heart disease Father   . Stroke Father   . Diabetes Maternal Aunt   . Diabetes Paternal Grandmother   . Hypertension Paternal Grandfather   . Stroke Paternal Grandfather     Past Surgical History:  Procedure Laterality Date  .  BACK SURGERY     x 2  . COLONOSCOPY  2004, 2009, 2014  . FINGER SURGERY    . GUM SURGERY    . GYNECOLOGIC CRYOSURGERY    . HYSTEROSCOPY W/D&C  12/07/2010   with resection of endometrial polyp  . KNEE ARTHROSCOPY Left 2008  . lip biopsy     done at MD office Fri 11/22/13  . MASS EXCISION Right 08/15/2013   Procedure: RIGHT INDEX EXCISION MASS ;  Surgeon: Tennis Must, MD;  Location: [facility];  Service: Orthopedics;  Laterality: Right;  . NASAL SEPTUM SURGERY    . OOPHORECTOMY Right 2000  . SHOULDER ARTHROSCOPY WITH OPEN ROTATOR CUFF REPAIR AND DISTAL CLAVICLE ACROMINECTOMY Right 11/26/2013   Procedure: RIGHT SHOULDER ARTHROSCOPY WITH MINI OPEN ROTATOR CUFF REPAIR AND DISTAL CLAVICLE RESECTION, SUBACROMIAL DECOMPRESSION, POSSIBLE [facility] PATCH.;  Surgeon: [name], MD;  Location: [facility];  Service: Orthopedics;  Laterality: Right;  . TOTAL KNEE ARTHROPLASTY Right 03/21/2017  . TOTAL KNEE ARTHROPLASTY Right 03/21/2017   Procedure: RIGHT TOTAL KNEE ARTHROPLASTY;  Surgeon: [name], MD;  Location: [facility];  Service: Orthopedics;  Laterality: Right;  . TUBAL LIGATION     Social History   Occupational History  . Occupation: Animal nutritionist    Comment: Disability/Retired  Tobacco Use  . Smoking status: Passive Smoke Exposure - Never Smoker  . Smokeless tobacco: Never Used  Substance and Sexual Activity  . Alcohol use: No    Alcohol/week: 0.0 oz    Frequency: Never  . Drug use: No  . Sexual activity: Yes    Partners: Male    Birth control/protection: Surgical, Post-menopausal    Comment: BTL-1st intercourse 67 yo-More than 5 partners

## 2017-04-24 ENCOUNTER — Ambulatory Visit: Payer: Medicare Other | Admitting: Physical Therapy

## 2017-04-24 ENCOUNTER — Encounter: Payer: Self-pay | Admitting: Physical Therapy

## 2017-04-24 DIAGNOSIS — M25561 Pain in right knee: Secondary | ICD-10-CM | POA: Diagnosis not present

## 2017-04-24 DIAGNOSIS — M6281 Muscle weakness (generalized): Secondary | ICD-10-CM

## 2017-04-24 DIAGNOSIS — R262 Difficulty in walking, not elsewhere classified: Secondary | ICD-10-CM | POA: Diagnosis not present

## 2017-04-24 DIAGNOSIS — G8929 Other chronic pain: Secondary | ICD-10-CM | POA: Diagnosis not present

## 2017-04-24 DIAGNOSIS — M25661 Stiffness of right knee, not elsewhere classified: Secondary | ICD-10-CM

## 2017-04-24 NOTE — Therapy (Signed)
[facility] [address] [place], [address] Phone: [phone]   Fax:  [phone]  Physical Therapy Treatment  Patient Details  Name: [name] MRN: [id number] Date of Birth: [birth year] Referring Provider: Joni Fears, MD   Encounter Date: 04/24/2017  PT End of Session - 04/24/17 1304    Visit Number  4    Number of Visits  13    Date for PT Re-Evaluation  05/25/17    PT Start Time  7654    PT Stop Time  1348    PT Time Calculation (min)  50 min    Equipment Utilized During Treatment  -- pt rw    Activity Tolerance  Patient tolerated treatment well    Behavior During Therapy  [facility] for tasks assessed/performed       Past Medical History:  Diagnosis Date  . Arthritis    back, fingers with joint pain and swelling.  chronic back pain  . Chronic back pain    arthritis   . Diverticulosis   . Elevated cholesterol    takes Niacin daily and Simvastatin  . GERD (gastroesophageal reflux disease) 06/2004   non-specific gastritis on EGD 06/2004  . Headache(784.0)    occasionally  . History of colon polyps 2004, 2009   2004:adenomatous. 2009 hyperplastic.   Marland Kitchen History of hiatal hernia   . Hypertension    takes Tenoretic and Lisinopril daily  . Mucoid cyst of joint 08/2013   right index finger  . Osteopenia 01/2014   T score -1.1 FRAX 14%/0.5%. Stable from prior DEXA  . PONV (postoperative nausea and vomiting)   . Seasonal allergies   . Stroke ([place]) 1998   x 2 - mild left-sided weakness  . Urge incontinence   . Uterine prolapse     Past Surgical History:  Procedure Laterality Date  . BACK SURGERY     x 2  . COLONOSCOPY  2004, 2009, 2014  . FINGER SURGERY    . GUM SURGERY    . GYNECOLOGIC CRYOSURGERY    . HYSTEROSCOPY W/D&C  12/07/2010   with resection of endometrial polyp  . KNEE ARTHROSCOPY Left 2008  . lip biopsy     done at MD office Fri 11/22/13  . MASS EXCISION Right 08/15/2013    Procedure: RIGHT INDEX EXCISION MASS ;  Surgeon: Tennis Must, MD;  Location: [place];  Service: Orthopedics;  Laterality: Right;  . NASAL SEPTUM SURGERY    . OOPHORECTOMY Right 2000  . SHOULDER ARTHROSCOPY WITH OPEN ROTATOR CUFF REPAIR AND DISTAL CLAVICLE ACROMINECTOMY Right 11/26/2013   Procedure: RIGHT SHOULDER ARTHROSCOPY WITH MINI OPEN ROTATOR CUFF REPAIR AND DISTAL CLAVICLE RESECTION, SUBACROMIAL DECOMPRESSION, POSSIBLE [facility] PATCH.;  Surgeon: [name], MD;  Location: [place];  Service: Orthopedics;  Laterality: Right;  . TOTAL KNEE ARTHROPLASTY Right 03/21/2017  . TOTAL KNEE ARTHROPLASTY Right 03/21/2017   Procedure: RIGHT TOTAL KNEE ARTHROPLASTY;  Surgeon: [name], MD;  Location: [place];  Service: Orthopedics;  Laterality: Right;  . TUBAL LIGATION      There were no vitals filed for this visit.  Subjective Assessment - 04/24/17 1302    Subjective  Patient reports she is feeling tighter today right knee and is feeling discouraged with progress. She is eager to walk without AD. She was seen by MD and was told she is doing well.  She reports that the taping performed on knee seems to have helped  with pain   Pertinent History  S/P R TKA on 03/21/2017. Pt had osteoarthritis and it was "bone on bone." Had 5 home health PT visits, and a little bit of PT at the hospital. R knee hurts currently and feels tight.  PLOF: pt was using a SPC at times when her knee was acting up real bad but did not have to use her SPC all the time.      Limitations  Walking;Standing;House hold activities;Sitting    Patient Stated Goals  I just want to be able to get back to normal. (Has 5 dogs she has to take care of, do chores around the house, be active).     Currently in Pain?  Yes    Pain Score  7     Pain Location  Knee    Pain Orientation  Right    Pain Descriptors / Indicators  Aching;Tightness    Pain Type  Surgical pain;Acute pain 03/21/2018    Pain Onset  More than a  month ago    Pain Frequency  Constant        Objective: Ambulating into clinic using rolling walker:;  forward leaning trunk, short step length bilateral AAROM right knee flexion 0 - 90 degrees and up to 95  degrees at end of session seated at edge of treatment table Palpation: increased warmth with tenderness along medial aspect right knee with swelling noted around knee and decreased soft tissue elasticity along lateral quadriceps, superior aspect of patella and distal scar mobility  Treatment:  Manual therapy: 15 min.: goal improve soft tissue elasticity, improve ROM right knee; improve gait pattern STM performed to right LE quadriceps, hamstrings with superficial techniques, scar mobilization right knee, patella mobilization glides all directions with patient supine and sitting  Therapeutic exercise: patient performed with demonstration, instruction, verbal and tactile cues of therapist:  Seated at edge of treatment table:  MAI for hamstrings 30/60/90 degrees flexion x 3 sets AAROM with mild resistance given for knee flexion/extension 2 x 10 reps Quad setting in supine with tactilel, VC  Modalities: Electrical stimulation: 20 min. Russian stim. 10/10 cycle applied (2) electrodes to quadriceps/VMO LE with patient reclined with LE supported on pillow; pt. Performing quad sets/knee extension with each cycle; goal muscle re education High volt estim.clincial program for muscle spasms  (2) electrodes applied to medial and lateral aspect of right knee, intensity to tolerance with patient in reclined position with LE supported on pillow goal: pain, spasms Ice pack applied to right knee with LE supported on wedge for elevation during estim.: no adverse reactions noted  Taping performed to right knee medial aspect for pain/swelling; performed following modalities with patient supine with knee in flexion; instructed patient to monitor pain/swelling and take tape off following 2-3 days of  wear   Patient response to treatment:Improved pain level to 4-5/10 following modalities. Improved AROM right knee flexion with at least 50% decreased pain, discomfort following STM. She verbalized understanding of healing and control of pain and swelling following extensive discussion.     PT Education - 04/24/17 1303    Education provided  Yes    Education Details  discussed progress and exercise technique    Person(s) Educated  Patient    Methods  Explanation;Demonstration;Verbal cues    Comprehension  Verbalized understanding;Returned demonstration;Verbal cues required          PT Long Term Goals - 04/12/17 1017      PT LONG TERM GOAL #1   Title  Patient will be able to ambulate at least 500 ft without use of AD and no LOB to promote mobility.     Baseline  Pt currently ambulates with rw (04/12/2017)    Time  6    Period  Weeks    Status  New    Target Date  05/25/17      PT LONG TERM GOAL #2   Title  Pt will improve seated R knee extension AROM to -8 degrees or less to promote ability to ambulate, perform standing tasks.     Baseline  seated R knee extension AROM -25 degrees (04/12/2017)    Time  6    Period  Weeks    Status  New    Target Date  05/25/17      PT LONG TERM GOAL #3   Title  Pt will improve seated R knee flexion AROM to at least 115 degrees to promote ability to ambulate, negiotiate steps, perform transfers with less difficulty.     Baseline  seated R knee flexion AROM 77 degrees (04/12/2017)    Time  6    Period  Weeks    Status  New    Target Date  05/25/17      PT LONG TERM GOAL #4   Title  Pt will improve R knee flexion strength to at least 4+/5 and R knee extension strength to at least 5/5 to promote ability to ambulate, perform chores.     Baseline  MMT not yet performed to allow for more healing. (04/12/2017)    Time  6    Period  Weeks    Status  New    Target Date  05/25/17      PT LONG TERM GOAL #5   Title  Patient will improve her LEFS  score by at least 9 points as a demonstration of improved function.    Baseline  15/80 (04/12/2017)    Time  6    Period  Weeks    Status  New    Target Date  05/25/17            Plan - 04/24/17 1305    Clinical Impression Statement  Patient demonstrates steady progress towards goals with improving ROM and strength with less pain medial aspect of right knee. She does continue with primary limiting factor of pain medial aspect of right knee with bending and decreased quadriceps and hamstring strength. she is responding well to current treatment and will require continued physical therapy intervention to achieve goals.      Rehab Potential  Fair    Clinical Impairments Affecting Rehab Potential  (-) healing time, pain; (+) husband support    PT Frequency  2x / week    PT Duration  6 weeks    PT Treatment/Interventions  Neuromuscular re-education;Therapeutic exercise;Therapeutic activities;Manual techniques;Aquatic Therapy;Electrical Stimulation;Iontophoresis 4m/ml Dexamethasone;Gait training;Stair training;Balance training;Functional mobility training;Patient/family education;Scar mobilization;Passive range of motion;Dry needling    PT Next Visit Plan  ROM, gait, function, manual techniques, modalities PRN    PT Home Exercise Plan  ROM, standing hip abduction/extension; quad sets seated, supine; elevation/ use of ice for swelling/pain       Patient will benefit from skilled therapeutic intervention in order to improve the following deficits and impairments:  Pain, Difficulty walking, Decreased strength, Decreased scar mobility, Decreased range of motion  Visit Diagnosis: Chronic pain of right knee  Muscle weakness (generalized)  Difficulty in walking, not elsewhere classified  Stiffness of right knee, not  elsewhere classified     Problem List Patient Active Problem List   Diagnosis Date Noted  . S/P TKR (total knee replacement) using cement, right 03/21/2017  . Primary  osteoarthritis of right knee 11/02/2016  . Nausea without vomiting 09/01/2016  . Chronic diarrhea 12/07/2014  . Segmental colitis with rectal bleeding ([place]) 09/12/2014  . Routine general medical examination at a health care facility 06/05/2014  . Overweight 06/13/2011  . ABNORMAL CHEST XRAY 09/30/2007  . Hyperlipidemia 02/17/2007  . Essential hypertension 01/08/2007  . Osteoarthritis 01/08/2007    Jomarie Longs PT 04/24/2017, 3:28 PM  [place] [facility] [address] [place], [address] Phone: [phone]   Fax:  [phone]  Name: TRESEA HEINE MRN: [id number] Date of Birth: [birth year]

## 2017-04-26 ENCOUNTER — Ambulatory Visit: Payer: Medicare Other | Admitting: Physical Therapy

## 2017-04-26 ENCOUNTER — Other Ambulatory Visit: Payer: Self-pay | Admitting: Internal Medicine

## 2017-04-26 ENCOUNTER — Encounter: Payer: Self-pay | Admitting: Physical Therapy

## 2017-04-26 DIAGNOSIS — M6281 Muscle weakness (generalized): Secondary | ICD-10-CM

## 2017-04-26 DIAGNOSIS — M25661 Stiffness of right knee, not elsewhere classified: Secondary | ICD-10-CM

## 2017-04-26 DIAGNOSIS — M25561 Pain in right knee: Principal | ICD-10-CM

## 2017-04-26 DIAGNOSIS — R262 Difficulty in walking, not elsewhere classified: Secondary | ICD-10-CM | POA: Diagnosis not present

## 2017-04-26 DIAGNOSIS — G8929 Other chronic pain: Secondary | ICD-10-CM | POA: Diagnosis not present

## 2017-04-26 NOTE — Therapy (Signed)
[place] [facility] [address] [place], [address] Phone: [phone]   Fax:  [phone]  Physical Therapy Treatment  Patient Details  Name: [name] MRN: [id number] Date of Birth: 08-15-1950 Referring Provider: Joni Fears, MD   Encounter Date: 04/26/2017  PT End of Session - 04/26/17 1307    Visit Number  5    Number of Visits  13    Date for PT Re-Evaluation  05/25/17    PT Start Time  1300    PT Stop Time  1350    PT Time Calculation (min)  50 min    Equipment Utilized During Treatment  -- pt rw    Activity Tolerance  Patient tolerated treatment well    Behavior During Therapy  [facility] for tasks assessed/performed       Past Medical History:  Diagnosis Date  . Arthritis    back, fingers with joint pain and swelling.  chronic back pain  . Chronic back pain    arthritis   . Diverticulosis   . Elevated cholesterol    takes Niacin daily and Simvastatin  . GERD (gastroesophageal reflux disease) 06/2004   non-specific gastritis on EGD 06/2004  . Headache(784.0)    occasionally  . History of colon polyps 2004, 2009   2004:adenomatous. 2009 hyperplastic.   Marland Kitchen History of hiatal hernia   . Hypertension    takes Tenoretic and Lisinopril daily  . Mucoid cyst of joint 08/2013   right index finger  . Osteopenia 01/2014   T score -1.1 FRAX 14%/0.5%. Stable from prior DEXA  . PONV (postoperative nausea and vomiting)   . Seasonal allergies   . Stroke ([place]) 1998   x 2 - mild left-sided weakness  . Urge incontinence   . Uterine prolapse     Past Surgical History:  Procedure Laterality Date  . BACK SURGERY     x 2  . COLONOSCOPY  2004, 2009, 2014  . FINGER SURGERY    . GUM SURGERY    . GYNECOLOGIC CRYOSURGERY    . HYSTEROSCOPY W/D&C  12/07/2010   with resection of endometrial polyp  . KNEE ARTHROSCOPY Left 2008  . lip biopsy     done at MD office Fri 11/22/13  . MASS EXCISION Right 08/15/2013    Procedure: RIGHT INDEX EXCISION MASS ;  Surgeon: Tennis Must, MD;  Location: [place];  Service: Orthopedics;  Laterality: Right;  . NASAL SEPTUM SURGERY    . OOPHORECTOMY Right 2000  . SHOULDER ARTHROSCOPY WITH OPEN ROTATOR CUFF REPAIR AND DISTAL CLAVICLE ACROMINECTOMY Right 11/26/2013   Procedure: RIGHT SHOULDER ARTHROSCOPY WITH MINI OPEN ROTATOR CUFF REPAIR AND DISTAL CLAVICLE RESECTION, SUBACROMIAL DECOMPRESSION, POSSIBLE [facility] PATCH.;  Surgeon: [name], MD;  Location: [place];  Service: Orthopedics;  Laterality: Right;  . TOTAL KNEE ARTHROPLASTY Right 03/21/2017  . TOTAL KNEE ARTHROPLASTY Right 03/21/2017   Procedure: RIGHT TOTAL KNEE ARTHROPLASTY;  Surgeon: [name], MD;  Location: [place];  Service: Orthopedics;  Laterality: Right;  . TUBAL LIGATION      There were no vitals filed for this visit.  Subjective Assessment - 04/26/17 1305    Subjective  Patient reports she is still feeling tighter today as well.    Pertinent History  S/P R TKA on 03/21/2017. Pt had osteoarthritis and it was "bone on bone." Had 5 home health PT visits, and a little bit of PT at the hospital. R knee  hurts currently and feels tight.  PLOF: pt was using a SPC at times when her knee was acting up real bad but did not have to use her SPC all the time.      Limitations  Walking;Standing;House hold activities;Sitting    Patient Stated Goals  I just want to be able to get back to normal. (Has 5 dogs she has to take care of, do chores around the house, be active).     Currently in Pain?  Yes    Pain Score  8     Pain Location  Knee    Pain Orientation  Right    Pain Descriptors / Indicators  Aching;Tightness    Pain Type  Acute pain;Surgical pain 03/21/2017    Pain Onset  More than a month ago    Pain Frequency  Constant          Objective: AAROM right knee flexion0 - 85 degrees and up to 95degrees at end of sessionseated at edge of treatment table Palpation:  increased warmth with tenderness along medial aspect right knee with swelling noted around knee and decreased soft tissue elasticity along lateral quadriceps, superior aspect of patella and distal scar mobility  Treatment:  Manual therapy: 74mn.: goal improve soft tissue elasticity, improve ROM right knee; improve gait pattern STM performed to right LE quadriceps, hamstrings with superficial techniques, scar mobilization right knee, patella mobilization glides all directions with patient supine and sitting  Therapeutic exercise: patient performed with demonstration, instruction, verbal and tactile cues of therapist:  Supine: AAROM for right knee and hip flexion/extension 3 sets 10 reps each with HS sets at end range x 5 reps, 3 second holds Quad setting with estim. X 20 min.  Modalities: Electrical stimulation:20 min.Russian stim. 10/10 cycle applied (2) electrodes to quadriceps/VMO LE with patient reclined with LE supported on pillow; pt. Performing quad sets/knee extension with each cycle; goal muscle re education High volt estim.clincial program for muscle spasms (2) electrodes applied to medial and lateral aspect of right knee,intensity to tolerance with patient in reclined position with LE supported on pillow goal: pain, spasms Ice pack applied to right knee with LE supported on pillowduring estim.: no adverse reactions noted  Patient response to treatment:Patient able to ambulate with rolling walker with less knee pain 25% at end of session. She demonstrated improved flexibility in right knee by 5 degrees with less pain following STM, exercise and decreased pain following estim./ice.      PT Education - 04/26/17 1314    Education provided  Yes    Education Details  exercise instruction /HEP rolling ball under LE in supine   Person(s) Educated  Patient    Methods  Explanation;Demonstration;Verbal cues    Comprehension  Verbalized understanding;Returned  demonstration;Verbal cues required          PT Long Term Goals - 04/12/17 1017      PT LONG TERM GOAL #1   Title  Patient will be able to ambulate at least 500 ft without use of AD and no LOB to promote mobility.     Baseline  Pt currently ambulates with rw (04/12/2017)    Time  6    Period  Weeks    Status  New    Target Date  05/25/17      PT LONG TERM GOAL #2   Title  Pt will improve seated R knee extension AROM to -8 degrees or less to promote ability to ambulate, perform standing tasks.  Baseline  seated R knee extension AROM -25 degrees (04/12/2017)    Time  6    Period  Weeks    Status  New    Target Date  05/25/17      PT LONG TERM GOAL #3   Title  Pt will improve seated R knee flexion AROM to at least 115 degrees to promote ability to ambulate, negiotiate steps, perform transfers with less difficulty.     Baseline  seated R knee flexion AROM 77 degrees (04/12/2017)    Time  6    Period  Weeks    Status  New    Target Date  05/25/17      PT LONG TERM GOAL #4   Title  Pt will improve R knee flexion strength to at least 4+/5 and R knee extension strength to at least 5/5 to promote ability to ambulate, perform chores.     Baseline  MMT not yet performed to allow for more healing. (04/12/2017)    Time  6    Period  Weeks    Status  New    Target Date  05/25/17      PT LONG TERM GOAL #5   Title  Patient will improve her LEFS score by at least 9 points as a demonstration of improved function.    Baseline  15/80 (04/12/2017)    Time  6    Period  Weeks    Status  New    Target Date  05/25/17            Plan - 04/26/17 1336    Clinical Impression Statement  Patient demonstrates improvement with soft tissue elasticity which allowed patient improvement with knee flexion. She continues with pain, stiffness and weakness limiting function and will benefit from continued physical therapy intervention.     Rehab Potential  Fair    Clinical Impairments Affecting Rehab  Potential  (-) healing time, pain; (+) husband support    PT Frequency  2x / week    PT Duration  6 weeks    PT Treatment/Interventions  Neuromuscular re-education;Therapeutic exercise;Therapeutic activities;Manual techniques;Aquatic Therapy;Electrical Stimulation;Iontophoresis 67m/ml Dexamethasone;Gait training;Stair training;Balance training;Functional mobility training;Patient/family education;Scar mobilization;Passive range of motion;Dry needling    PT Next Visit Plan  ROM, gait, function, manual techniques, modalities PRN    PT Home Exercise Plan  ROM, standing hip abduction/extension; quad sets seated, supine; elevation/ use of ice for swelling/pain       Patient will benefit from skilled therapeutic intervention in order to improve the following deficits and impairments:  Pain, Difficulty walking, Decreased strength, Decreased scar mobility, Decreased range of motion  Visit Diagnosis: Chronic pain of right knee  Muscle weakness (generalized)  Difficulty in walking, not elsewhere classified  Stiffness of right knee, not elsewhere classified     Problem List Patient Active Problem List   Diagnosis Date Noted  . S/P TKR (total knee replacement) using cement, right 03/21/2017  . Primary osteoarthritis of right knee 11/02/2016  . Nausea without vomiting 09/01/2016  . Chronic diarrhea 12/07/2014  . [place] 09/12/2014  . Routine general medical examination at a health care facility 06/05/2014  . Overweight 06/13/2011  . ABNORMAL CHEST XRAY 09/30/2007  . Hyperlipidemia 02/17/2007  . Essential hypertension 01/08/2007  . Osteoarthritis 01/08/2007    BJomarie LongsPT 04/27/2017, 7:07 PM  [facility] [address] [place] [address]Phone: [phone]  Fax:  [phone]  Name: [name] MRN: [id number] Date of Birth: 1950/12/05

## 2017-05-01 ENCOUNTER — Ambulatory Visit: Payer: Medicare Other | Admitting: Physical Therapy

## 2017-05-01 ENCOUNTER — Encounter: Payer: Self-pay | Admitting: Physical Therapy

## 2017-05-01 DIAGNOSIS — M25561 Pain in right knee: Principal | ICD-10-CM

## 2017-05-01 DIAGNOSIS — M6281 Muscle weakness (generalized): Secondary | ICD-10-CM

## 2017-05-01 DIAGNOSIS — M25661 Stiffness of right knee, not elsewhere classified: Secondary | ICD-10-CM

## 2017-05-01 DIAGNOSIS — R262 Difficulty in walking, not elsewhere classified: Secondary | ICD-10-CM

## 2017-05-01 DIAGNOSIS — G8929 Other chronic pain: Secondary | ICD-10-CM | POA: Diagnosis not present

## 2017-05-01 NOTE — Therapy (Signed)
[facility] [address], [place], [address] Phone: [phone]   Fax:  [phone]  Physical Therapy Treatment  Patient Details  Name: [name] MRN: [id number] Date of Birth: July 24, 1950 Referring Provider: Joni Fears, MD   Encounter Date: 05/01/2017  PT End of Session - 05/01/17 1307    Visit Number  6    Number of Visits  13    Date for PT Re-Evaluation  05/25/17    PT Start Time  1300    PT Stop Time  8527    PT Time Calculation (min)  52 min    Equipment Utilized During Treatment  -- pt rw    Activity Tolerance  Patient tolerated treatment well    Behavior During Therapy  [facility] for tasks assessed/performed       Past Medical History:  Diagnosis Date  . Arthritis    back, fingers with joint pain and swelling.  chronic back pain  . Chronic back pain    arthritis   . Diverticulosis   . Elevated cholesterol    takes Niacin daily and Simvastatin  . GERD (gastroesophageal reflux disease) 06/2004   non-specific gastritis on EGD 06/2004  . Headache(784.0)    occasionally  . History of colon polyps 2004, 2009   2004:adenomatous. 2009 hyperplastic.   Marland Kitchen History of hiatal hernia   . Hypertension    takes Tenoretic and Lisinopril daily  . Mucoid cyst of joint 08/2013   right index finger  . Osteopenia 01/2014   T score -1.1 FRAX 14%/0.5%. Stable from prior DEXA  . PONV (postoperative nausea and vomiting)   . Seasonal allergies   . Stroke ([place]) 1998   x 2 - mild left-sided weakness  . Urge incontinence   . Uterine prolapse     Past Surgical History:  Procedure Laterality Date  . BACK SURGERY     x 2  . COLONOSCOPY  2004, 2009, 2014  . FINGER SURGERY    . GUM SURGERY    . GYNECOLOGIC CRYOSURGERY    . HYSTEROSCOPY W/D&C  12/07/2010   with resection of endometrial polyp  . KNEE ARTHROSCOPY Left 2008  . lip biopsy     done at MD office Fri 11/22/13  . MASS EXCISION Right 08/15/2013    Procedure: RIGHT INDEX EXCISION MASS ;  Surgeon: Tennis Must, MD;  Location: [place];  Service: Orthopedics;  Laterality: Right;  . NASAL SEPTUM SURGERY    . OOPHORECTOMY Right 2000  . SHOULDER ARTHROSCOPY WITH OPEN ROTATOR CUFF REPAIR AND DISTAL CLAVICLE ACROMINECTOMY Right 11/26/2013   Procedure: RIGHT SHOULDER ARTHROSCOPY WITH MINI OPEN ROTATOR CUFF REPAIR AND DISTAL CLAVICLE RESECTION, SUBACROMIAL DECOMPRESSION, POSSIBLE [facility] PATCH.;  Surgeon: [name], MD;  Location: [place];  Service: Orthopedics;  Laterality: Right;  . TOTAL KNEE ARTHROPLASTY Right 03/21/2017  . TOTAL KNEE ARTHROPLASTY Right 03/21/2017   Procedure: RIGHT TOTAL KNEE ARTHROPLASTY;  Surgeon: [name], MD;  Location: [place];  Service: Orthopedics;  Laterality: Right;  . TUBAL LIGATION      There were no vitals filed for this visit.  Subjective Assessment - 05/01/17 1303    Subjective  Patient reports she is still having pain in medial aspect of right knee and the soreness is "80%" improved overall. Nighttime is better.     Pertinent History  S/P R TKA on 03/21/2017. Pt had osteoarthritis and it was "bone on bone." Had 5 home  health PT visits, and a little bit of PT at the hospital. R knee hurts currently and feels tight.  PLOF: pt was using a SPC at times when her knee was acting up real bad but did not have to use her SPC all the time.      Limitations  Walking;Standing;House hold activities;Sitting    Patient Stated Goals  I just want to be able to get back to normal. (Has 5 dogs she has to take care of, do chores around the house, be active).     Currently in Pain?  Yes    Pain Score  5     Pain Location  Knee    Pain Orientation  Right    Pain Descriptors / Indicators  Aching;Tightness    Pain Onset  More than a month ago    Pain Frequency  Constant       Objective: AAROM right knee flexion0 - 92 degrees and up to 98degrees at end of sessionseated at edge of treatment  table Palpation: mild warmth noted right knee medial/lateral and inferior aspect; moderate spasms along central and lateral quadriceps, decreased patellar mobility medial/lateral and inferior glides  Treatment:  Manual therapy: 103mn.: goal improve soft tissue elasticity, improve ROM right knee; improve gait pattern STM performed to right LE quadriceps, hamstrings with superficial techniques, scar mobilization right knee, patella mobilization glides all directionswith patient supine with pillow supporting LE  Therapeutic exercise: patient performed with demonstration, instruction, verbal and tactile cues of therapist:   Supine: AAROM for right knee and hip flexion/extension 3 sets 10 reps each with HS sets at end range x 5 reps, 3 second holds Hip abduction with isometric hold end range x 10 reps 3-5 second holds with mild graded manual resistance given by therapist Hook lying ER/abduction (clam) with isometric hold x 10 reps with moderate manual resistance given by therapist Quad setting with estim. X 20 min.  Modalities: Electrical stimulation:15 min.Russian stim. 10/10 cycle applied (2) electrodes to quadriceps/VMO LE with patient reclined with LE supported on pillow; pt. Performing quad sets/knee extension with each cycle; goal muscle re education Pre modulating estim clincial program for acute pain  (2) electrodes applied to medial and lateral aspect of right knee,intensity to tolerance with patient in reclined position with LE supported on pillow goal: pain Ice pack applied to right knee with LE supported onpillowduring estim.: no adverse reactions noted  Patient response to treatment:Patient demonstrated improved soft tissue elasticity and decreased spasms by up to 50% following STM and improved motor control and strength noted with isometric HS and hip exercises with repetition and minimal cuing. She had increased pain with all flexion exercises and required cuing to keep  pain level below 5/10. Improved quadriceps motor control with repetition and estim. Patient able to walk with improved weight bearing at end of session.      PT Education - 05/01/17 1307    Education provided  Yes    Education Details  exercise instruction for technique    Person(s) Educated  Patient    Methods  Explanation;Demonstration;Verbal cues    Comprehension  Verbalized understanding;Returned demonstration;Verbal cues required          PT Long Term Goals - 04/12/17 1017      PT LONG TERM GOAL #1   Title  Patient will be able to ambulate at least 500 ft without use of AD and no LOB to promote mobility.     Baseline  Pt currently ambulates with rw (  04/12/2017)    Time  6    Period  Weeks    Status  New    Target Date  05/25/17      PT LONG TERM GOAL #2   Title  Pt will improve seated R knee extension AROM to -8 degrees or less to promote ability to ambulate, perform standing tasks.     Baseline  seated R knee extension AROM -25 degrees (04/12/2017)    Time  6    Period  Weeks    Status  New    Target Date  05/25/17      PT LONG TERM GOAL #3   Title  Pt will improve seated R knee flexion AROM to at least 115 degrees to promote ability to ambulate, negiotiate steps, perform transfers with less difficulty.     Baseline  seated R knee flexion AROM 77 degrees (04/12/2017)    Time  6    Period  Weeks    Status  New    Target Date  05/25/17      PT LONG TERM GOAL #4   Title  Pt will improve R knee flexion strength to at least 4+/5 and R knee extension strength to at least 5/5 to promote ability to ambulate, perform chores.     Baseline  MMT not yet performed to allow for more healing. (04/12/2017)    Time  6    Period  Weeks    Status  New    Target Date  05/25/17      PT LONG TERM GOAL #5   Title  Patient will improve her LEFS score by at least 9 points as a demonstration of improved function.    Baseline  15/80 (04/12/2017)    Time  6    Period  Weeks    Status  New     Target Date  05/25/17            Plan - 05/01/17 1308    Clinical Impression Statement Patient is progressing steadily towards goals and is doing better since walking with walker and decreasing weight bearing through right LE. She improved ability to weight bear through right LE at end of session with less difficulty and pain. Improved ROM to 98 degrees flexion from previous session.    Rehab Potential  Fair    Clinical Impairments Affecting Rehab Potential  (-) healing time, pain; (+) husband support    PT Frequency  2x / week    PT Duration  6 weeks    PT Treatment/Interventions  Neuromuscular re-education;Therapeutic exercise;Therapeutic activities;Manual techniques;Aquatic Therapy;Electrical Stimulation;Iontophoresis 15m/ml Dexamethasone;Gait training;Stair training;Balance training;Functional mobility training;Patient/family education;Scar mobilization;Passive range of motion;Dry needling    PT Next Visit Plan  ROM, gait, function, manual techniques, modalities PRN    PT Home Exercise Plan  ROM, standing hip abduction/extension; quad sets seated, supine; elevation/ use of ice for swelling/pain       Patient will benefit from skilled therapeutic intervention in order to improve the following deficits and impairments:  Pain, Difficulty walking, Decreased strength, Decreased scar mobility, Decreased range of motion  Visit Diagnosis: Chronic pain of right knee  Muscle weakness (generalized)  Stiffness of right knee, not elsewhere classified  Difficulty in walking, not elsewhere classified     Problem List Patient Active Problem List   Diagnosis Date Noted  . S/P TKR (total knee replacement) using cement, right 03/21/2017  . Primary osteoarthritis of right knee 11/02/2016  . Nausea without vomiting 09/01/2016  .  Chronic diarrhea 12/07/2014  . Segmental colitis with rectal bleeding ([place]) 09/12/2014  . Routine general medical examination at a health care facility 06/05/2014   . Overweight 06/13/2011  . ABNORMAL CHEST XRAY 09/30/2007  . Hyperlipidemia 02/17/2007  . Essential hypertension 01/08/2007  . Osteoarthritis 01/08/2007    Jomarie Longs PT 05/01/2017, 1:59 PM  [place] [facility] [address] [place], [address] Phone: [phone]   Fax:  [phone]  Name: [name] MRN: [id number] Date of Birth: 1951-01-05

## 2017-05-03 ENCOUNTER — Ambulatory Visit: Payer: Medicare Other | Admitting: Physical Therapy

## 2017-05-03 ENCOUNTER — Encounter: Payer: Self-pay | Admitting: Physical Therapy

## 2017-05-03 DIAGNOSIS — M25561 Pain in right knee: Principal | ICD-10-CM

## 2017-05-03 DIAGNOSIS — R262 Difficulty in walking, not elsewhere classified: Secondary | ICD-10-CM

## 2017-05-03 DIAGNOSIS — G8929 Other chronic pain: Secondary | ICD-10-CM | POA: Diagnosis not present

## 2017-05-03 DIAGNOSIS — M25661 Stiffness of right knee, not elsewhere classified: Secondary | ICD-10-CM

## 2017-05-03 DIAGNOSIS — M6281 Muscle weakness (generalized): Secondary | ICD-10-CM

## 2017-05-03 NOTE — Therapy (Signed)
[place] [facility] [address] [place], [address] Phone: [phone]   Fax:  [phone]  Physical Therapy Treatment  Patient Details  Name: [name] MRN: [id number] Date of Birth: Jan 22, 1951 Referring Provider: Joni Fears, MD   Encounter Date: 05/03/2017  PT End of Session - 05/03/17 1127    Visit Number  5    Number of Visits  13    Date for PT Re-Evaluation  05/25/17    PT Start Time  1122    PT Stop Time  1212    PT Time Calculation (min)  50 min    Equipment Utilized During Treatment  -- pt rw    Activity Tolerance  Patient tolerated treatment well;Patient limited by pain    Behavior During Therapy  [facility] for tasks assessed/performed       Past Medical History:  Diagnosis Date  . Arthritis    back, fingers with joint pain and swelling.  chronic back pain  . Chronic back pain    arthritis   . Diverticulosis   . Elevated cholesterol    takes Niacin daily and Simvastatin  . GERD (gastroesophageal reflux disease) 06/2004   non-specific gastritis on EGD 06/2004  . Headache(784.0)    occasionally  . History of colon polyps 2004, 2009   2004:adenomatous. 2009 hyperplastic.   Marland Kitchen History of hiatal hernia   . Hypertension    takes Tenoretic and Lisinopril daily  . Mucoid cyst of joint 08/2013   right index finger  . Osteopenia 01/2014   T score -1.1 FRAX 14%/0.5%. Stable from prior DEXA  . PONV (postoperative nausea and vomiting)   . Seasonal allergies   . Stroke ([place]) 1998   x 2 - mild left-sided weakness  . Urge incontinence   . Uterine prolapse     Past Surgical History:  Procedure Laterality Date  . BACK SURGERY     x 2  . COLONOSCOPY  2004, 2009, 2014  . FINGER SURGERY    . GUM SURGERY    . GYNECOLOGIC CRYOSURGERY    . HYSTEROSCOPY W/D&C  12/07/2010   with resection of endometrial polyp  . KNEE ARTHROSCOPY Left 2008  . lip biopsy     done at MD office Fri 11/22/13  . MASS  EXCISION Right 08/15/2013   Procedure: RIGHT INDEX EXCISION MASS ;  Surgeon: Tennis Must, MD;  Location: [place];  Service: Orthopedics;  Laterality: Right;  . NASAL SEPTUM SURGERY    . OOPHORECTOMY Right 2000  . SHOULDER ARTHROSCOPY WITH OPEN ROTATOR CUFF REPAIR AND DISTAL CLAVICLE ACROMINECTOMY Right 11/26/2013   Procedure: RIGHT SHOULDER ARTHROSCOPY WITH MINI OPEN ROTATOR CUFF REPAIR AND DISTAL CLAVICLE RESECTION, SUBACROMIAL DECOMPRESSION, POSSIBLE [facility] PATCH.;  Surgeon: [name], MD;  Location: [place];  Service: Orthopedics;  Laterality: Right;  . TOTAL KNEE ARTHROPLASTY Right 03/21/2017  . TOTAL KNEE ARTHROPLASTY Right 03/21/2017   Procedure: RIGHT TOTAL KNEE ARTHROPLASTY;  Surgeon: [name], MD;  Location: [place];  Service: Orthopedics;  Laterality: Right;  . TUBAL LIGATION      There were no vitals filed for this visit.  Subjective Assessment - 05/03/17 1126    Subjective  Patient report still having pain and swelling medial aspect right knee.     Pertinent History  S/P R TKA on 03/21/2017. Pt had osteoarthritis and it was "bone on bone." Had 5 home health PT visits, and a little bit of PT  at the hospital. R knee hurts currently and feels tight.  PLOF: pt was using a SPC at times when her knee was acting up real bad but did not have to use her SPC all the time.      Limitations  Walking;Standing;House hold activities;Sitting    Patient Stated Goals  I just want to be able to get back to normal. (Has 5 dogs she has to take care of, do chores around the house, be active).     Currently in Pain?  Yes    Pain Score  4     Pain Location  Knee    Pain Orientation  Right    Pain Descriptors / Indicators  Aching;Tightness    Pain Onset  More than a month ago    Pain Frequency  Constant         Objective: AAROM right knee flexion0 -102degrees seated at edge of treatment table Palpation: mild warmth noted right knee medial/lateral and inferior  aspect; moderate spasms along central and lateral quadriceps, decreased patellar mobility medial/lateral and inferior glides  Treatment:  Manual therapy: 20mn.: goal improve soft tissue elasticity, improve ROM right knee; improve gait pattern STM performed to right LE quadriceps, hamstrings with superficial techniques, scar mobilization right knee, patella mobilization glides all directionswith patient supine with pillow supporting LE  Therapeutic exercise: patient performed with demonstration, instruction, verbal and tactile cues of therapist:  Supine: AAROM for right knee and hip flexion/extension 3 sets 10 reps each with HS sets at end range x 5 reps, 3 second holds Quad setting with estim. X 15 min.  Modalities: Electrical stimulation:15 min.Russian stim. 10/10 cycle applied (2) electrodes to quadriceps/VMO LE with patient reclined with LE supported on pillow; pt. Performing quad sets/knee extension with each cycle; goal muscle re education Pre modulating estim clincial program for acute pain  (2) electrodes applied to medial and lateral aspect of right knee,intensity to tolerance with patient in reclined position with LE supported on pillow goal: pain Ice pack applied to right knee with LE supported onpillowduring estim.: no adverse reactions noted  Patient response to treatment:Patient demonstrated improved flexibility in soft tissue, decreased pain with knee flexion exorcises today with only mild pain/discomfort. Improved soft tissue elasticity by up to 50% following STM.      PT Education - 05/02/17 1206    Education provided  Yes    Education Details  exercise instruction     Person(s) Educated  Patient    Methods  Explanation;Verbal cues    Comprehension  Verbalized understanding;Verbal cues required          PT Long Term Goals - 04/12/17 1017      PT LONG TERM GOAL #1   Title  Patient will be able to ambulate at least 500 ft without use of AD and no  LOB to promote mobility.     Baseline  Pt currently ambulates with rw (04/12/2017)    Time  6    Period  Weeks    Status  New    Target Date  05/25/17      PT LONG TERM GOAL #2   Title  Pt will improve seated R knee extension AROM to -8 degrees or less to promote ability to ambulate, perform standing tasks.     Baseline  seated R knee extension AROM -25 degrees (04/12/2017)    Time  6    Period  Weeks    Status  New    Target Date  05/25/17      PT LONG TERM GOAL #3   Title  Pt will improve seated R knee flexion AROM to at least 115 degrees to promote ability to ambulate, negiotiate steps, perform transfers with less difficulty.     Baseline  seated R knee flexion AROM 77 degrees (04/12/2017)    Time  6    Period  Weeks    Status  New    Target Date  05/25/17      PT LONG TERM GOAL #4   Title  Pt will improve R knee flexion strength to at least 4+/5 and R knee extension strength to at least 5/5 to promote ability to ambulate, perform chores.     Baseline  MMT not yet performed to allow for more healing. (04/12/2017)    Time  6    Period  Weeks    Status  New    Target Date  05/25/17      PT LONG TERM GOAL #5   Title  Patient will improve her LEFS score by at least 9 points as a demonstration of improved function.    Baseline  15/80 (04/12/2017)    Time  6    Period  Weeks    Status  New    Target Date  05/25/17            Plan - 05/03/17 1128    Clinical Impression Statement  Progressing steadily towards goals, continues with tightness, swelling, pain medial aspect of knee. She is still using walker at home with good results. She should continue to progress ROM, strength for improved functional use right LE with additional physical therapy intervention.    Rehab Potential  Fair    Clinical Impairments Affecting Rehab Potential  (-) healing time, pain; (+) husband support    PT Frequency  2x / week    PT Duration  6 weeks    PT Treatment/Interventions  Neuromuscular  re-education;Therapeutic exercise;Therapeutic activities;Manual techniques;Aquatic Therapy;Electrical Stimulation;Iontophoresis 46m/ml Dexamethasone;Gait training;Stair training;Balance training;Functional mobility training;Patient/family education;Scar mobilization;Passive range of motion;Dry needling    PT Next Visit Plan  ROM, gait, function, manual techniques, modalities PRN    PT Home Exercise Plan  ROM, standing hip abduction/extension; quad sets seated, supine; elevation/ use of ice for swelling/pain       Patient will benefit from skilled therapeutic intervention in order to improve the following deficits and impairments:  Pain, Difficulty walking, Decreased strength, Decreased scar mobility, Decreased range of motion  Visit Diagnosis: Chronic pain of right knee  Muscle weakness (generalized)  Stiffness of right knee, not elsewhere classified  Difficulty in walking, not elsewhere classified     Problem List Patient Active Problem List   Diagnosis Date Noted  . S/P TKR (total knee replacement) using cement, right 03/21/2017  . Primary osteoarthritis of right knee 11/02/2016  . Nausea without vomiting 09/01/2016  . Chronic diarrhea 12/07/2014  . Seg[place] 09/12/2014  . Routine general medical examination at a health care facility 06/05/2014  . Overweight 06/13/2011  . ABNORMAL CHEST XRAY 09/30/2007  . Hyperlipidemia 02/17/2007  . Essential hypertension 01/08/2007  . Osteoarthritis 01/08/2007    BJomarie LongsPT 05/03/2017, 12:28 PM  [facility] [address] [place] [address]Phone: [phone]  Fax:  [phone] Name: Stacy PEREYRAMRN: [id number] of Birth: 325-Jun-1952

## 2017-05-08 ENCOUNTER — Ambulatory Visit: Payer: Medicare Other | Admitting: Physical Therapy

## 2017-05-08 ENCOUNTER — Encounter: Payer: Self-pay | Admitting: Physical Therapy

## 2017-05-08 DIAGNOSIS — R262 Difficulty in walking, not elsewhere classified: Secondary | ICD-10-CM

## 2017-05-08 DIAGNOSIS — G8929 Other chronic pain: Secondary | ICD-10-CM

## 2017-05-08 DIAGNOSIS — M6281 Muscle weakness (generalized): Secondary | ICD-10-CM

## 2017-05-08 DIAGNOSIS — M25561 Pain in right knee: Secondary | ICD-10-CM | POA: Diagnosis not present

## 2017-05-08 DIAGNOSIS — M25661 Stiffness of right knee, not elsewhere classified: Secondary | ICD-10-CM | POA: Diagnosis not present

## 2017-05-08 NOTE — Therapy (Signed)
[facility] [address] [place], [address] Phone: [phone]   Fax:  [phone]  Physical Therapy Treatment  Patient Details  Name: [name] MRN: [id number] Date of Birth: Aug 12, 1950 Referring Provider: Joni Fears, MD   Encounter Date: 05/08/2017  PT End of Session - 05/08/17 1124    Visit Number  8    Number of Visits  13    Date for PT Re-Evaluation  05/25/17    PT Start Time  1119    PT Stop Time  1205    PT Time Calculation (min)  46 min    Equipment Utilized During Treatment  -- pt rw    Activity Tolerance  Patient tolerated treatment well;Patient limited by pain    Behavior During Therapy  [facility] for tasks assessed/performed       Past Medical History:  Diagnosis Date  . Arthritis    back, fingers with joint pain and swelling.  chronic back pain  . Chronic back pain    arthritis   . Diverticulosis   . Elevated cholesterol    takes Niacin daily and Simvastatin  . GERD (gastroesophageal reflux disease) 06/2004   non-specific gastritis on EGD 06/2004  . Headache(784.0)    occasionally  . History of colon polyps 2004, 2009   2004:adenomatous. 2009 hyperplastic.   Marland Kitchen History of hiatal hernia   . Hypertension    takes Tenoretic and Lisinopril daily  . Mucoid cyst of joint 08/2013   right index finger  . Osteopenia 01/2014   T score -1.1 FRAX 14%/0.5%. Stable from prior DEXA  . PONV (postoperative nausea and vomiting)   . Seasonal allergies   . Stroke ([place]) 1998   x 2 - mild left-sided weakness  . Urge incontinence   . Uterine prolapse     Past Surgical History:  Procedure Laterality Date  . BACK SURGERY     x 2  . COLONOSCOPY  2004, 2009, 2014  . FINGER SURGERY    . GUM SURGERY    . GYNECOLOGIC CRYOSURGERY    . HYSTEROSCOPY W/D&C  12/07/2010   with resection of endometrial polyp  . KNEE ARTHROSCOPY Left 2008  . lip biopsy     done at MD office Fri 11/22/13  . MASS  EXCISION Right 08/15/2013   Procedure: RIGHT INDEX EXCISION MASS ;  Surgeon: Tennis Must, MD;  Location: [place];  Service: Orthopedics;  Laterality: Right;  . NASAL SEPTUM SURGERY    . OOPHORECTOMY Right 2000  . SHOULDER ARTHROSCOPY WITH OPEN ROTATOR CUFF REPAIR AND DISTAL CLAVICLE ACROMINECTOMY Right 11/26/2013   Procedure: RIGHT SHOULDER ARTHROSCOPY WITH MINI OPEN ROTATOR CUFF REPAIR AND DISTAL CLAVICLE RESECTION, SUBACROMIAL DECOMPRESSION, POSSIBLE [facility] PATCH.;  Surgeon: [name], MD;  Location: [place];  Service: Orthopedics;  Laterality: Right;  . TOTAL KNEE ARTHROPLASTY Right 03/21/2017  . TOTAL KNEE ARTHROPLASTY Right 03/21/2017   Procedure: RIGHT TOTAL KNEE ARTHROPLASTY;  Surgeon: [name], MD;  Location: [place];  Service: Orthopedics;  Laterality: Right;  . TUBAL LIGATION      There were no vitals filed for this visit.  Subjective Assessment - 05/08/17 1122    Subjective  Patient reports being up on feet Saturday and noticing increased pain that evening and feels stiff today.     Pertinent History  S/P R TKA on 03/21/2017. Pt had osteoarthritis and it was "bone on bone." Had 5 home health PT visits,  and a little bit of PT at the hospital. R knee hurts currently and feels tight.  PLOF: pt was using a SPC at times when her knee was acting up real bad but did not have to use her SPC all the time.      Limitations  Walking;Standing;House hold activities;Sitting    Patient Stated Goals  I just want to be able to get back to normal. (Has 5 dogs she has to take care of, do chores around the house, be active).     Currently in Pain?  Yes    Pain Score  3     Pain Location  Knee    Pain Orientation  Right    Pain Descriptors / Indicators  Tightness    Pain Type  Surgical pain;Acute pain 03/21/17    Pain Onset  More than a month ago    Pain Frequency  Constant       Objective: AAROM right knee flexion0 -90degrees seated at edge of treatment  table Palpation:mild warmth noted right knee medial/lateral and inferior aspect; moderate spasms along central and lateral quadriceps, decreased patellar mobility medial/lateral and inferior glides  Treatment:  Manual therapy: 78mn.: goal improve soft tissue elasticity, improve ROM right knee; improve gait pattern STM performed to right LE quadriceps, hamstrings with superficial techniques, scar mobilization right knee, with patient seated at edge of treatment table with LE's supported and knee flexed   Therapeutic exercise: patient performed with demonstration, instruction, verbal and tactile cues of therapist:  Supine: AAROM for right knee and hip flexion/extension 3 sets 5 reps each with HS sets at end range x 5 reps, 3 second holds Hip extension with knee extension x 10 reps with manual resistance of therapist 5 second holds  NuStep: 5 min. Level #3 intensity for weight shifting /ROM with guidance for correct placement/ alignment of LE's  Modalities: Electrical stimulation:170m.Russian stim. 10/10 cycle applied (2) electrodes to quadriceps/VMO LE with patient reclined with LE supported on pillow; pt. Performing quad sets/knee extension with each cycle; goal muscle re education Pre modulating estimclincial program foracute pain(2) electrodes applied to medial and lateral aspect of right knee,intensity to tolerance with patient in reclined position with LE supported on pillow goal: pain Ice pack applied to right knee with LE supported onpillowduring estim.: no adverse reactions noted  Patient response to treatment:Patient with improved soft tissue elasticity by 30% following STM, decreased hyper sensitivity along medial aspect of knee, no change in ROM. Patient required moderate cuing and assistance to perform exercises with good technique and alignment, improved with repetition. She continued with reported stiffness at end of session.      PT Education - 05/08/17  1200    Education provided  Yes    Education Details  exercise instruction for technique    Person(s) Educated  Patient    Methods  Explanation;Demonstration;Verbal cues    Comprehension  Verbalized understanding;Returned demonstration;Verbal cues required          PT Long Term Goals - 04/12/17 1017      PT LONG TERM GOAL #1   Title  Patient will be able to ambulate at least 500 ft without use of AD and no LOB to promote mobility.     Baseline  Pt currently ambulates with rw (04/12/2017)    Time  6    Period  Weeks    Status  New    Target Date  05/25/17      PT LONG TERM GOAL #2  Title  Pt will improve seated R knee extension AROM to -8 degrees or less to promote ability to ambulate, perform standing tasks.     Baseline  seated R knee extension AROM -25 degrees (04/12/2017)    Time  6    Period  Weeks    Status  New    Target Date  05/25/17      PT LONG TERM GOAL #3   Title  Pt will improve seated R knee flexion AROM to at least 115 degrees to promote ability to ambulate, negiotiate steps, perform transfers with less difficulty.     Baseline  seated R knee flexion AROM 77 degrees (04/12/2017)    Time  6    Period  Weeks    Status  New    Target Date  05/25/17      PT LONG TERM GOAL #4   Title  Pt will improve R knee flexion strength to at least 4+/5 and R knee extension strength to at least 5/5 to promote ability to ambulate, perform chores.     Baseline  MMT not yet performed to allow for more healing. (04/12/2017)    Time  6    Period  Weeks    Status  New    Target Date  05/25/17      PT LONG TERM GOAL #5   Title  Patient will improve her LEFS score by at least 9 points as a demonstration of improved function.    Baseline  15/80 (04/12/2017)    Time  6    Period  Weeks    Status  New    Target Date  05/25/17            Plan - 05/08/17 1205    Clinical Impression Statement  Patient is progressing steadily towards goals and continues with swelling, pain medial  aspect right knee with increased activity. She should continue to improve ROM, strength with additional physical therapy intervention     Rehab Potential  Fair    Clinical Impairments Affecting Rehab Potential  (-) healing time, pain; (+) husband support    PT Frequency  2x / week    PT Duration  6 weeks    PT Treatment/Interventions  Neuromuscular re-education;Therapeutic exercise;Therapeutic activities;Manual techniques;Aquatic Therapy;Electrical Stimulation;Iontophoresis 33m/ml Dexamethasone;Gait training;Stair training;Balance training;Functional mobility training;Patient/family education;Scar mobilization;Passive range of motion;Dry needling    PT Next Visit Plan  ROM, gait, function, manual techniques, modalities PRN    PT Home Exercise Plan  ROM, standing hip abduction/extension; quad sets seated, supine; elevation/ use of ice for swelling/pain       Patient will benefit from skilled therapeutic intervention in order to improve the following deficits and impairments:  Pain, Difficulty walking, Decreased strength, Decreased scar mobility, Decreased range of motion  Visit Diagnosis: Chronic pain of right knee  Muscle weakness (generalized)  Stiffness of right knee, not elsewhere classified  Difficulty in walking, not elsewhere classified     Problem List Patient Active Problem List   Diagnosis Date Noted  . S/P TKR (total knee replacement) using cement, right 03/21/2017  . Primary osteoarthritis of right knee 11/02/2016  . Nausea without vomiting 09/01/2016  . Chronic diarrhea 12/07/2014  . Segmental [place] 09/12/2014  . Routine general medical examination at a health care facility 06/05/2014  . Overweight 06/13/2011  . ABNORMAL CHEST XRAY 09/30/2007  . Hyperlipidemia 02/17/2007  . Essential hypertension 01/08/2007  . Osteoarthritis 01/08/2007    BJomarie LongsPT 05/08/2017,  5:20 PM  [place] [facility] [address] [place], [address] Phone: [phone]   Fax:  [phone]  Name: ANESIA BLACKWELL MRN: [id number] Date of Birth: 1950/05/02

## 2017-05-10 ENCOUNTER — Ambulatory Visit: Payer: Medicare Other | Admitting: Physical Therapy

## 2017-05-10 ENCOUNTER — Encounter: Payer: Self-pay | Admitting: Physical Therapy

## 2017-05-10 DIAGNOSIS — M25661 Stiffness of right knee, not elsewhere classified: Secondary | ICD-10-CM | POA: Diagnosis not present

## 2017-05-10 DIAGNOSIS — M25561 Pain in right knee: Principal | ICD-10-CM

## 2017-05-10 DIAGNOSIS — R262 Difficulty in walking, not elsewhere classified: Secondary | ICD-10-CM | POA: Diagnosis not present

## 2017-05-10 DIAGNOSIS — M6281 Muscle weakness (generalized): Secondary | ICD-10-CM | POA: Diagnosis not present

## 2017-05-10 DIAGNOSIS — G8929 Other chronic pain: Secondary | ICD-10-CM | POA: Diagnosis not present

## 2017-05-10 NOTE — Therapy (Signed)
[place] [facility] [address], [place], [address] Phone: [phone]   Fax:  [phone]  Physical Therapy Treatment  Patient Details  Name: [name] MRN: [id number] Date of Birth: [birth year] Referring Provider: Joni Fears, MD   Encounter Date: 05/10/2017  PT End of Session - 05/10/17 1211    Visit Number  9    Number of Visits  13    Date for PT Re-Evaluation  05/25/17    PT Start Time  1122    PT Stop Time  1203    PT Time Calculation (min)  41 min    Equipment Utilized During Treatment  -- pt rw    Activity Tolerance  Patient tolerated treatment well;Patient limited by pain    Behavior During Therapy  [facility] for tasks assessed/performed       Past Medical History:  Diagnosis Date  . Arthritis    back, fingers with joint pain and swelling.  chronic back pain  . Chronic back pain    arthritis   . Diverticulosis   . Elevated cholesterol    takes Niacin daily and Simvastatin  . GERD (gastroesophageal reflux disease) 06/2004   non-specific gastritis on EGD 06/2004  . Headache(784.0)    occasionally  . History of colon polyps 2004, 2009   2004:adenomatous. 2009 hyperplastic.   Marland Kitchen History of hiatal hernia   . Hypertension    takes Tenoretic and Lisinopril daily  . Mucoid cyst of joint 08/2013   right index finger  . Osteopenia 01/2014   T score -1.1 FRAX 14%/0.5%. Stable from prior DEXA  . PONV (postoperative nausea and vomiting)   . Seasonal allergies   . Stroke ([place]) 1998   x 2 - mild left-sided weakness  . Urge incontinence   . Uterine prolapse     Past Surgical History:  Procedure Laterality Date  . BACK SURGERY     x 2  . COLONOSCOPY  2004, 2009, 2014  . FINGER SURGERY    . GUM SURGERY    . GYNECOLOGIC CRYOSURGERY    . HYSTEROSCOPY W/D&C  12/07/2010   with resection of endometrial polyp  . KNEE ARTHROSCOPY Left 2008  . lip biopsy     done at MD office Fri 11/22/13  . MASS  EXCISION Right 08/15/2013   Procedure: RIGHT INDEX EXCISION MASS ;  Surgeon: Tennis Must, MD;  Location: [place];  Service: Orthopedics;  Laterality: Right;  . NASAL SEPTUM SURGERY    . OOPHORECTOMY Right 2000  . SHOULDER ARTHROSCOPY WITH OPEN ROTATOR CUFF REPAIR AND DISTAL CLAVICLE ACROMINECTOMY Right 11/26/2013   Procedure: RIGHT SHOULDER ARTHROSCOPY WITH MINI OPEN ROTATOR CUFF REPAIR AND DISTAL CLAVICLE RESECTION, SUBACROMIAL DECOMPRESSION, POSSIBLE [facility] PATCH.;  Surgeon: [name], MD;  Location: [place];  Service: Orthopedics;  Laterality: Right;  . TOTAL KNEE ARTHROPLASTY Right 03/21/2017  . TOTAL KNEE ARTHROPLASTY Right 03/21/2017   Procedure: RIGHT TOTAL KNEE ARTHROPLASTY;  Surgeon: [name], MD;  Location: [place];  Service: Orthopedics;  Laterality: Right;  . TUBAL LIGATION      There were no vitals filed for this visit.  Subjective Assessment - 05/10/17 1125    Subjective  Patient reports she rode in car yesterday for 3-4 hours to [facility] and back and has increased soreness and increased warmth in her knee today and feels more stiffness with bending    Pertinent History  S/P R TKA on 03/21/2017. Pt had  osteoarthritis and it was "bone on bone." Had 5 home health PT visits, and a little bit of PT at the hospital. R knee hurts currently and feels tight.  PLOF: pt was using a SPC at times when her knee was acting up real bad but did not have to use her SPC all the time.      Limitations  Walking;Standing;House hold activities;Sitting    Patient Stated Goals  I just want to be able to get back to normal. (Has 5 dogs she has to take care of, do chores around the house, be active).     Currently in Pain?  Yes    Pain Score  3     Pain Location  Knee    Pain Orientation  Right    Pain Descriptors / Indicators  Tightness;Aching    Pain Type  Surgical pain 03/21/17    Pain Onset  More than a month ago    Pain Frequency  Constant         Objective: AAROM  right knee flexion0 - 85 degrees pre treatment and up to 95 following treatment  seated at edge of treatment table Palpation:mild increased warmth and swelling noted right knee medial aspect with swelling noted increased from previous session.   Treatment:  Manual therapy: 5mn.: goal improve soft tissue elasticity, improve ROM right knee; improve gait pattern STM performed to right LE quadriceps, hamstrings with superficial techniques, scar mobilization right knee, with patient seated at edge of treatment table with LE's supported and knee flexed   Therapeutic exercise: patient performed with demonstration, instruction, verbal and tactile cues of therapist:  Supine: AAROM for right knee and hip flexion/extension 3 sets 5 reps each with HS sets at end range x 5 reps, 3 second holds Hip extension with knee extension x 10 reps with manual resistance of therapist 5 second holds  Modalities: Electrical stimulation:127m.Russian stim. 10/10 cycle applied (2) electrodes to quadriceps/VMO LE with patient reclined with LE supported on pillow; pt. Performing quad sets/knee extension with each cycle; goal muscle re education Pre modulating estimclincial program foracute pain(2) electrodes applied to medial and lateral aspect of right knee,intensity to tolerance with patient in reclined position with LE supported on pillow goal: pain Ice pack applied to right knee with LE supported onpillowduring estim.: no adverse reactions noted  Patient response to treatment: Improved soft tissue elasticity and ROM following STM and exercise and electrical stimulation. She demonstrated improved weight shift/bearing onto right LE at end of session, continued with pain on weight bearing, Pain level remained about the same.        PT Education - 05/10/17 1211    Education provided  Yes    Education Details  exercise instruction, use of ice and elevation to control swelling, pain    Person(s)  Educated  Patient    Methods  Explanation;Verbal cues;Demonstration    Comprehension  Verbalized understanding;Returned demonstration;Verbal cues required          PT Long Term Goals - 04/12/17 1017      PT LONG TERM GOAL #1   Title  Patient will be able to ambulate at least 500 ft without use of AD and no LOB to promote mobility.     Baseline  Pt currently ambulates with rw (04/12/2017)    Time  6    Period  Weeks    Status  New    Target Date  05/25/17      PT LONG TERM GOAL #2  Title  Pt will improve seated R knee extension AROM to -8 degrees or less to promote ability to ambulate, perform standing tasks.     Baseline  seated R knee extension AROM -25 degrees (04/12/2017)    Time  6    Period  Weeks    Status  New    Target Date  05/25/17      PT LONG TERM GOAL #3   Title  Pt will improve seated R knee flexion AROM to at least 115 degrees to promote ability to ambulate, negiotiate steps, perform transfers with less difficulty.     Baseline  seated R knee flexion AROM 77 degrees (04/12/2017)    Time  6    Period  Weeks    Status  New    Target Date  05/25/17      PT LONG TERM GOAL #4   Title  Pt will improve R knee flexion strength to at least 4+/5 and R knee extension strength to at least 5/5 to promote ability to ambulate, perform chores.     Baseline  MMT not yet performed to allow for more healing. (04/12/2017)    Time  6    Period  Weeks    Status  New    Target Date  05/25/17      PT LONG TERM GOAL #5   Title  Patient will improve her LEFS score by at least 9 points as a demonstration of improved function.    Baseline  15/80 (04/12/2017)    Time  6    Period  Weeks    Status  New    Target Date  05/25/17            Plan - 05/10/17 1212    Clinical Impression Statement  Patient continues with limitations of swelling and pain right knee and is using walker for good gait pattern. She continues with pain on weight bearing right LE. She will require continued  physical therapy intervention to address pain and decreased strength in order to achieve maximal function and be able to ambulate without AD.    Rehab Potential  Fair    Clinical Impairments Affecting Rehab Potential  (-) healing time, pain; (+) husband support    PT Frequency  2x / week    PT Duration  6 weeks    PT Treatment/Interventions  Neuromuscular re-education;Therapeutic exercise;Therapeutic activities;Manual techniques;Aquatic Therapy;Electrical Stimulation;Iontophoresis 42m/ml Dexamethasone;Gait training;Stair training;Balance training;Functional mobility training;Patient/family education;Scar mobilization;Passive range of motion;Dry needling    PT Next Visit Plan  ROM, gait, function, manual techniques, modalities PRN    PT Home Exercise Plan  ROM, standing hip abduction/extension; quad sets seated, supine; elevation/ use of ice for swelling/pain       Patient will benefit from skilled therapeutic intervention in order to improve the following deficits and impairments:  Pain, Difficulty walking, Decreased strength, Decreased scar mobility, Decreased range of motion  Visit Diagnosis: Chronic pain of right knee  Muscle weakness (generalized)  Stiffness of right knee, not elsewhere classified  Difficulty in walking, not elsewhere classified     Problem List Patient Active Problem List   Diagnosis Date Noted  . S/P TKR (total knee replacement) using cement, right 03/21/2017  . Primary osteoarthritis of right knee 11/02/2016  . Nausea without vomiting 09/01/2016  . Chronic diarrhea 12/07/2014  . Seg[place] with [place] 09/12/[place]  . Routine general medical examination at a health care facility 06/05/2014  . Overweight 06/13/2011  . ABNORMAL CHEST  XRAY 09/30/2007  . Hyperlipidemia 02/17/2007  . Essential hypertension 01/08/2007  . Osteoarthritis 01/08/2007    Jomarie Longs PT 05/11/2017, 7:24 AM  [place]  [facility] [address] [place], [address] Phone: [phone]   Fax:  [phone]  Name: [name] MRN: [id number] Date of Birth: 1951-01-24

## 2017-05-15 ENCOUNTER — Encounter: Payer: Self-pay | Admitting: Physical Therapy

## 2017-05-15 ENCOUNTER — Ambulatory Visit: Payer: Medicare Other | Attending: Orthopaedic Surgery | Admitting: Physical Therapy

## 2017-05-15 DIAGNOSIS — R262 Difficulty in walking, not elsewhere classified: Secondary | ICD-10-CM | POA: Insufficient documentation

## 2017-05-15 DIAGNOSIS — M25561 Pain in right knee: Secondary | ICD-10-CM | POA: Diagnosis not present

## 2017-05-15 DIAGNOSIS — M25661 Stiffness of right knee, not elsewhere classified: Secondary | ICD-10-CM | POA: Diagnosis not present

## 2017-05-15 DIAGNOSIS — M6281 Muscle weakness (generalized): Secondary | ICD-10-CM | POA: Diagnosis not present

## 2017-05-15 NOTE — Therapy (Signed)
[facility] [address], [place], [address] Phone: [phone]   Fax:  [phone]  Physical Therapy Treatment  Patient Details  Name: [name] MRN: [id number] Date of Birth: Dec 28, 1950 Referring Provider: Joni Fears, MD   Encounter Date: 05/15/2017  PT End of Session - 05/15/17 0901    Visit Number  10    Number of Visits  13    Date for PT Re-Evaluation  05/25/17    PT Start Time  0858    PT Stop Time  0945    PT Time Calculation (min)  47 min    Equipment Utilized During Treatment  -- pt rw    Activity Tolerance  Patient tolerated treatment well;Patient limited by pain    Behavior During Therapy  [facility] for tasks assessed/performed       Past Medical History:  Diagnosis Date  . Arthritis    back, fingers with joint pain and swelling.  chronic back pain  . Chronic back pain    arthritis   . Diverticulosis   . Elevated cholesterol    takes Niacin daily and Simvastatin  . GERD (gastroesophageal reflux disease) 06/2004   non-specific gastritis on EGD 06/2004  . Headache(784.0)    occasionally  . History of colon polyps 2004, 2009   2004:adenomatous. 2009 hyperplastic.   Marland Kitchen History of hiatal hernia   . Hypertension    takes Tenoretic and Lisinopril daily  . Mucoid cyst of joint 08/2013   right index finger  . Osteopenia 01/2014   T score -1.1 FRAX 14%/0.5%. Stable from prior DEXA  . PONV (postoperative nausea and vomiting)   . Seasonal allergies   . Stroke ([place]) 1998   x 2 - mild left-sided weakness  . Urge incontinence   . Uterine prolapse     Past Surgical History:  Procedure Laterality Date  . BACK SURGERY     x 2  . COLONOSCOPY  2004, 2009, 2014  . FINGER SURGERY    . GUM SURGERY    . GYNECOLOGIC CRYOSURGERY    . HYSTEROSCOPY W/D&C  12/07/2010   with resection of endometrial polyp  . KNEE ARTHROSCOPY Left 2008  . lip biopsy     done at MD office Fri 11/22/13  . MASS  EXCISION Right 08/15/2013   Procedure: RIGHT INDEX EXCISION MASS ;  Surgeon: Tennis Must, MD;  Location: [place];  Service: Orthopedics;  Laterality: Right;  . NASAL SEPTUM SURGERY    . OOPHORECTOMY Right 2000  . SHOULDER ARTHROSCOPY WITH OPEN ROTATOR CUFF REPAIR AND DISTAL CLAVICLE ACROMINECTOMY Right 11/26/2013   Procedure: RIGHT SHOULDER ARTHROSCOPY WITH MINI OPEN ROTATOR CUFF REPAIR AND DISTAL CLAVICLE RESECTION, SUBACROMIAL DECOMPRESSION, POSSIBLE [facility] PATCH.;  Surgeon: [name], MD;  Location: [place];  Service: Orthopedics;  Laterality: Right;  . TOTAL KNEE ARTHROPLASTY Right 03/21/2017  . TOTAL KNEE ARTHROPLASTY Right 03/21/2017   Procedure: RIGHT TOTAL KNEE ARTHROPLASTY;  Surgeon: [name], MD;  Location: [place];  Service: Orthopedics;  Laterality: Right;  . TUBAL LIGATION      There were no vitals filed for this visit.  Subjective Assessment - 05/15/17 0859    Subjective  Patient reports she is feeling more stffness and some sharp stabbing pains in her knee today. Overall less sharp pains medial aspect of knee.     Pertinent History  S/P R TKA on 03/21/2017. Pt had osteoarthritis and it was "bone on  bone." Had 5 home health PT visits, and a little bit of PT at the hospital. R knee hurts currently and feels tight.  PLOF: pt was using a SPC at times when her knee was acting up real bad but did not have to use her SPC all the time.      Limitations  Walking;Standing;House hold activities;Sitting    Patient Stated Goals  I just want to be able to get back to normal. (Has 5 dogs she has to take care of, do chores around the house, be active).     Currently in Pain?  Yes    Pain Score  2     Pain Location  Knee    Pain Orientation  Right    Pain Descriptors / Indicators  Tightness    Pain Type  Surgical pain 03/21/17    Pain Onset  More than a month ago    Pain Frequency  Constant       Objective: AAROM right knee flexion0 -  95 degrees pre  treatment and up to 105 degrees post treatment  seated at edge of treatment table Palpation:mild increased warmth and swelling noted right knee medial aspect with swelling; decreased from previous session.   Treatment:  Manual therapy: 65mn.: goal improve soft tissue elasticity, improve ROM right knee; improve gait pattern STM performed to right LE quadriceps, hamstrings with superficial techniques, scar mobilization right knee,with patient seated at edge of treatment table with LE's supported and knee flexed  Therapeutic exercise: patient performed with demonstration, instruction, verbal and tactile cues of therapist: goal: independent with home program, improve LEFS, ambulation without AD Sitting at edge of table:  Ball under foot, 4# weight on ankle roll ball back and forth x 25 reps Red resistive band knee flexion x 15 reps through full ROM; instructed for home use End range isometric flexion hold 5 second x 5 reps   Modalities: Electrical stimulation:137m.Russian stim. 10/10 cycle applied (2) electrodes to quadriceps/VMO LE with patient reclined with LE supported on pillow; pt. Performing quad sets/knee extension with each cycle; goal muscle re education High volt estim (2) electrodes applied to medial and lateral aspect of right knee,intensity to tolerance with patient in reclined position with LE supported on pillow goal: pain Ice pack applied to right knee with LE supported onpillowduring estim.: no adverse reactions noted  Patient response to treatment: improved soft tissue elasticity and scar tissue elasticity 50% following STM. Improved ROM to 105 degrees following strengthening with resistive band/isometric end range flexion exercises.        PT Education - 05/15/17 0930    Education provided  Yes    Education Details  re assessed home program for swelling, pain and exercises    Person(s) Educated  Patient    Methods  Explanation;Demonstration;Verbal cues     Comprehension  Verbalized understanding;Returned demonstration;Verbal cues required          PT Long Term Goals - 04/12/17 1017      PT LONG TERM GOAL #1   Title  Patient will be able to ambulate at least 500 ft without use of AD and no LOB to promote mobility.     Baseline  Pt currently ambulates with rw (04/12/2017)    Time  6    Period  Weeks    Status  New    Target Date  05/25/17      PT LONG TERM GOAL #2   Title  Pt will improve seated R knee extension  AROM to -8 degrees or less to promote ability to ambulate, perform standing tasks.     Baseline  seated R knee extension AROM -25 degrees (04/12/2017)    Time  6    Period  Weeks    Status  New    Target Date  05/25/17      PT LONG TERM GOAL #3   Title  Pt will improve seated R knee flexion AROM to at least 115 degrees to promote ability to ambulate, negiotiate steps, perform transfers with less difficulty.     Baseline  seated R knee flexion AROM 77 degrees (04/12/2017)    Time  6    Period  Weeks    Status  New    Target Date  05/25/17      PT LONG TERM GOAL #4   Title  Pt will improve R knee flexion strength to at least 4+/5 and R knee extension strength to at least 5/5 to promote ability to ambulate, perform chores.     Baseline  MMT not yet performed to allow for more healing. (04/12/2017)    Time  6    Period  Weeks    Status  New    Target Date  05/25/17      PT LONG TERM GOAL #5   Title  Patient will improve her LEFS score by at least 9 points as a demonstration of improved function.    Baseline  15/80 (04/12/2017)    Time  6    Period  Weeks    Status  New    Target Date  05/25/17            Plan - 05/15/17 0901    Clinical Impression Statement  Patient continues with pain and swelling that limits progression with ambulaiton without AD.  She demonstrated improved ROM and soft tissue elasticity with treatment and should continue to improve with additional physical therapy intervention.    Rehab  Potential  Fair    Clinical Impairments Affecting Rehab Potential  (-) healing time, pain; (+) husband support    PT Frequency  2x / week    PT Duration  6 weeks    PT Treatment/Interventions  Neuromuscular re-education;Therapeutic exercise;Therapeutic activities;Manual techniques;Aquatic Therapy;Electrical Stimulation;Iontophoresis 25m/ml Dexamethasone;Gait training;Stair training;Balance training;Functional mobility training;Patient/family education;Scar mobilization;Passive range of motion;Dry needling    PT Next Visit Plan  ROM, gait, function, manual techniques, modalities PRN    PT Home Exercise Plan  ROM, standing hip abduction/extension; quad sets seated, supine; elevation/ use of ice for swelling/pain       Patient will benefit from skilled therapeutic intervention in order to improve the following deficits and impairments:  Pain, Difficulty walking, Decreased strength, Decreased scar mobility, Decreased range of motion  Visit Diagnosis: Muscle weakness (generalized)  Acute pain of right knee  Stiffness of right knee, not elsewhere classified  Difficulty in walking, not elsewhere classified     Problem List Patient Active Problem List   Diagnosis Date Noted  . S/P TKR (total knee replacement) using cement, right 03/21/2017  . Primary osteoarthritis of right knee 11/02/2016  . Nausea without vomiting 09/01/2016  . Chronic diarrhea 12/07/2014  . Segmental colitis with rectal bleeding ([place] 09/12/2014  . Routine general medical examination at a health care facility 06/05/2014  . Overweight 06/13/2011  . ABNORMAL CHEST XRAY 09/30/2007  . Hyperlipidemia 02/17/2007  . Essential hypertension 01/08/2007  . Osteoarthritis 01/08/2007    BJomarie LongsPT 05/15/2017, 9:27 PM  [facility]  [place] [facility] [address], [place], [address] Phone: [phone]   Fax:  [phone]  Name: [name] MRN:  [id number] Date of Birth: 30-Jan-1951

## 2017-05-17 ENCOUNTER — Encounter: Payer: Self-pay | Admitting: Physical Therapy

## 2017-05-17 ENCOUNTER — Ambulatory Visit: Payer: Medicare Other | Admitting: Physical Therapy

## 2017-05-17 DIAGNOSIS — M25561 Pain in right knee: Secondary | ICD-10-CM | POA: Diagnosis not present

## 2017-05-17 DIAGNOSIS — R262 Difficulty in walking, not elsewhere classified: Secondary | ICD-10-CM | POA: Diagnosis not present

## 2017-05-17 DIAGNOSIS — M6281 Muscle weakness (generalized): Secondary | ICD-10-CM | POA: Diagnosis not present

## 2017-05-17 DIAGNOSIS — M25661 Stiffness of right knee, not elsewhere classified: Secondary | ICD-10-CM

## 2017-05-17 NOTE — Therapy (Signed)
[place] [facility] [address] [place], [address] Phone: [phone]   Fax:  [phone]  Physical Therapy Treatment  Patient Details  Name: [name] MRN: [id number] Date of Birth: [birth year] Referring Provider: Joni Fears, MD   Encounter Date: 05/17/2017  PT End of Session - 05/17/17 1244    Visit Number  11    Number of Visits  13    Date for PT Re-Evaluation  05/25/17    PT Start Time  1150    PT Stop Time  1240    PT Time Calculation (min)  50 min    Equipment Utilized During Treatment  -- pt rw    Activity Tolerance  Patient tolerated treatment well;Patient limited by pain    Behavior During Therapy  [facility] for tasks assessed/performed       Past Medical History:  Diagnosis Date  . Arthritis    back, fingers with joint pain and swelling.  chronic back pain  . Chronic back pain    arthritis   . Diverticulosis   . Elevated cholesterol    takes Niacin daily and Simvastatin  . GERD (gastroesophageal reflux disease) 06/2004   non-specific gastritis on EGD 06/2004  . Headache(784.0)    occasionally  . History of colon polyps 2004, 2009   2004:adenomatous. 2009 hyperplastic.   Marland Kitchen History of hiatal hernia   . Hypertension    takes Tenoretic and Lisinopril daily  . Mucoid cyst of joint 08/2013   right index finger  . Osteopenia 01/2014   T score -1.1 FRAX 14%/0.5%. Stable from prior DEXA  . PONV (postoperative nausea and vomiting)   . Seasonal allergies   . Stroke ([place]) 1998   x 2 - mild left-sided weakness  . Urge incontinence   . Uterine prolapse     Past Surgical History:  Procedure Laterality Date  . BACK SURGERY     x 2  . COLONOSCOPY  2004, 2009, 2014  . FINGER SURGERY    . GUM SURGERY    . GYNECOLOGIC CRYOSURGERY    . HYSTEROSCOPY W/D&C  12/07/2010   with resection of endometrial polyp  . KNEE ARTHROSCOPY Left 2008  . lip biopsy     done at MD office Fri 11/22/13  . MASS  EXCISION Right 08/15/2013   Procedure: RIGHT INDEX EXCISION MASS ;  Surgeon: Tennis Must, MD;  Location: [place];  Service: Orthopedics;  Laterality: Right;  . NASAL SEPTUM SURGERY    . OOPHORECTOMY Right 2000  . SHOULDER ARTHROSCOPY WITH OPEN ROTATOR CUFF REPAIR AND DISTAL CLAVICLE ACROMINECTOMY Right 11/26/2013   Procedure: RIGHT SHOULDER ARTHROSCOPY WITH MINI OPEN ROTATOR CUFF REPAIR AND DISTAL CLAVICLE RESECTION, SUBACROMIAL DECOMPRESSION, POSSIBLE [facility] PATCH.;  Surgeon: [name], MD;  Location: [place];  Service: Orthopedics;  Laterality: Right;  . TOTAL KNEE ARTHROPLASTY Right 03/21/2017  . TOTAL KNEE ARTHROPLASTY Right 03/21/2017   Procedure: RIGHT TOTAL KNEE ARTHROPLASTY;  Surgeon: [name], MD;  Location: [place];  Service: Orthopedics;  Laterality: Right;  . TUBAL LIGATION      There were no vitals filed for this visit.  Subjective Assessment - 05/17/17 1155    Subjective  Patient reports she is bending knee with less difficulty with walking and still has increased pain with walking, putting weight on right LE   Pertinent History  S/P R TKA on 03/21/2017. Pt had osteoarthritis and it was "bone on bone." Had 5  home health PT visits, and a little bit of PT at the hospital. R knee hurts currently and feels tight.  PLOF: pt was using a SPC at times when her knee was acting up real bad but did not have to use her SPC all the time.      Limitations  Walking;Standing;House hold activities;Sitting    Patient Stated Goals  I just want to be able to get back to normal. (Has 5 dogs she has to take care of, do chores around the house, be active).     Currently in Pain?  Yes    Pain Score  2     Pain Location  Knee    Pain Orientation  Right    Pain Descriptors / Indicators  Aching;Tightness;Sore    Pain Type  Surgical pain 03/21/2017    Pain Onset  More than a month ago    Pain Frequency  Constant       Objective: AAROM right knee flexion0-100  degrees pre treatment and up to 105 degrees post treatment seated at edge of treatment table Palpation:mild increased warmthand swellingnoted right knee medial aspect with swelling; decreased from previous session.   Treatment:  Manual therapy: 45mn.: goal improve soft tissue elasticity, improve ROM right knee; improve gait pattern STM performed to right LE quadriceps, hamstrings with superficial techniques, scar mobilization right knee,with patient seated at edge of treatment table with LE's supported and knee flexed  Therapeutic exercise: patient performed with demonstration, instruction, verbal and tactile cues of therapist: goal: independent with home program, improve LEFS, ambulation without AD balance system exercises: for weight shifting forward and back x 2 min. And side to side x 2 min.: improved technique with visual feed back  limits if stability 2 skill levels 59% /95% improved low and 85%/63% shift to top is primary difficulty  side stepping along airex balance beam x 2 min.     Modalities: Electrical stimulation:131m.Russian stim. 10/10 cycle applied (2) electrodes to quadriceps/VMO LE with patient reclined with LE supported on pillow; pt. Performing quad sets/knee extension with each cycle; goal muscle re education High volt estim (2) electrodes applied to medial and lateral aspect of right knee,intensity to tolerance with patient in reclined position with LE supported on pillow goal: pain Ice pack applied to right knee with LE supported onpillowduring estim.: no adverse reactions noted  Patient response to treatment: improved technique with balance exercise on balance system with visual cues and following demonstration. Improved soft tissue elasticity with decreased swelling medial aspect of right knee following STM. Improved knee flexion 5 degrees following session. Continued with increased pain on weight bearing at end of session.        PT  Education - 05/17/17 1157    Education provided  Yes    Education Details  balance system exercise instruction; instructed to walk at home with cane 10 steps 3x/day or more if able, ice, elevation and exercise to continue as instructed    Person(s) Educated  Patient    Methods  Explanation;Demonstration;Verbal cues    Comprehension  Verbalized understanding;Returned demonstration;Verbal cues required          PT Long Term Goals - 04/12/17 1017      PT LONG TERM GOAL #1   Title  Patient will be able to ambulate at least 500 ft without use of AD and no LOB to promote mobility.     Baseline  Pt currently ambulates with rw (04/12/2017)    Time  6  Period  Weeks    Status  New    Target Date  05/25/17      PT LONG TERM GOAL #2   Title  Pt will improve seated R knee extension AROM to -8 degrees or less to promote ability to ambulate, perform standing tasks.     Baseline  seated R knee extension AROM -25 degrees (04/12/2017)    Time  6    Period  Weeks    Status  New    Target Date  05/25/17      PT LONG TERM GOAL #3   Title  Pt will improve seated R knee flexion AROM to at least 115 degrees to promote ability to ambulate, negiotiate steps, perform transfers with less difficulty.     Baseline  seated R knee flexion AROM 77 degrees (04/12/2017)    Time  6    Period  Weeks    Status  New    Target Date  05/25/17      PT LONG TERM GOAL #4   Title  Pt will improve R knee flexion strength to at least 4+/5 and R knee extension strength to at least 5/5 to promote ability to ambulate, perform chores.     Baseline  MMT not yet performed to allow for more healing. (04/12/2017)    Time  6    Period  Weeks    Status  New    Target Date  05/25/17      PT LONG TERM GOAL #5   Title  Patient will improve her LEFS score by at least 9 points as a demonstration of improved function.    Baseline  15/80 (04/12/2017)    Time  6    Period  Weeks    Status  New    Target Date  05/25/17             Plan - 05/17/17 1619    Clinical Impression Statement  Patient demonstrates steady improvement with ROM, strength right LE s/p TKA. She continues with primary limiting factor of pain on weight bearing along medial aspect of right knee. She will benefit from continued physical therapy intervention to further improve weight shift, strength and ROM in order to achieve maximal function.     Rehab Potential  Fair    Clinical Impairments Affecting Rehab Potential  (-) healing time, pain; (+) husband support    PT Frequency  2x / week    PT Duration  6 weeks    PT Treatment/Interventions  Neuromuscular re-education;Therapeutic exercise;Therapeutic activities;Manual techniques;Aquatic Therapy;Electrical Stimulation;Iontophoresis 29m/ml Dexamethasone;Gait training;Stair training;Balance training;Functional mobility training;Patient/family education;Scar mobilization;Passive range of motion;Dry needling    PT Next Visit Plan  ROM, gait, function, manual techniques, modalities PRN    PT Home Exercise Plan  ROM, standing hip abduction/extension; quad sets seated, supine; elevation/ use of ice for swelling/pain       Patient will benefit from skilled therapeutic intervention in order to improve the following deficits and impairments:  Pain, Difficulty walking, Decreased strength, Decreased scar mobility, Decreased range of motion  Visit Diagnosis: Acute pain of right knee  Muscle weakness (generalized)  Stiffness of right knee, not elsewhere classified  Difficulty in walking, not elsewhere classified     Problem List Patient Active Problem List   Diagnosis Date Noted  . S/P TKR (total knee replacement) using cement, right 03/21/2017  . Primary osteoarthritis of right knee 11/02/2016  . Nausea without vomiting 09/01/2016  . Chronic diarrhea 12/07/2014  . Segmental  colitis with rectal bleeding ([place]) 09/12/2014  . Routine general medical examination at a health care facility  06/05/2014  . Overweight 06/13/2011  . ABNORMAL CHEST XRAY 09/30/2007  . Hyperlipidemia 02/17/2007  . Essential hypertension 01/08/2007  . Osteoarthritis 01/08/2007    Jomarie Longs PT 05/17/2017, 4:21 PM  [place] [facility] [address] [place], [address] Phone: [phone]   Fax:  [phone]  Name: BIDDIE SEBEK MRN: [id number] Date of Birth: [birth year]

## 2017-05-22 ENCOUNTER — Encounter: Payer: Self-pay | Admitting: Physical Therapy

## 2017-05-22 ENCOUNTER — Ambulatory Visit: Payer: Medicare Other | Admitting: Physical Therapy

## 2017-05-22 ENCOUNTER — Ambulatory Visit (INDEPENDENT_AMBULATORY_CARE_PROVIDER_SITE_OTHER): Payer: Medicare Other | Admitting: Orthopaedic Surgery

## 2017-05-22 ENCOUNTER — Encounter (INDEPENDENT_AMBULATORY_CARE_PROVIDER_SITE_OTHER): Payer: Self-pay | Admitting: Orthopaedic Surgery

## 2017-05-22 VITALS — BP 105/73 | HR 84 | Resp 14 | Ht 67.0 in | Wt 165.0 lb

## 2017-05-22 DIAGNOSIS — M6281 Muscle weakness (generalized): Secondary | ICD-10-CM

## 2017-05-22 DIAGNOSIS — M25561 Pain in right knee: Secondary | ICD-10-CM

## 2017-05-22 DIAGNOSIS — R262 Difficulty in walking, not elsewhere classified: Secondary | ICD-10-CM

## 2017-05-22 DIAGNOSIS — M25661 Stiffness of right knee, not elsewhere classified: Secondary | ICD-10-CM | POA: Diagnosis not present

## 2017-05-22 DIAGNOSIS — Z96651 Presence of right artificial knee joint: Secondary | ICD-10-CM

## 2017-05-22 NOTE — Progress Notes (Signed)
Office Visit Note   Patient: [name]           Date of Birth: June 09, 1950           MRN: [id number] Visit Date: 05/22/2017              Requested by: Stacy Koch, MD [place], [place] [id number] PCP: Stacy Koch, MD   Assessment & Plan: Visit Diagnoses:  1. History of total right knee replacement     Plan: 2 months status post primary right total knee replacement and progressing nicely with therapy. Now using a cane. No fever or chills shortness of breath or chest pain. Has achieved just over 100 of flexion per the therapist. We'll urge her to continue with the exercises and return to the office in 2 months. We'll need antibiotics for any invasive procedures. Still has considerable quad weakness which limits her function at this point  Follow-Up Instructions: Return in about 2 months (around 07/20/2017).   Orders:  No orders of the defined types were placed in this encounter.  No orders of the defined types were placed in this encounter.     Procedures: No procedures performed   Clinical Data: No additional findings.   Subjective: Chief Complaint  Patient presents with  . Right Knee - Routine Post Op    [name] is a 67 y o S/P 8 weeks Right total knee replacement. She alternates between a cane and walker. She is continuing PT but wants an extention.  Presently using a single-point cane. Definitely can tell a difference frena preoperative pain in her present discomfort.  HPI  Review of Systems  Constitutional: Negative for chills, fatigue and fever.  HENT: Negative for hearing loss and tinnitus.   Eyes: Negative for itching.  Respiratory: Negative for chest tightness and shortness of breath.   Cardiovascular: Negative for chest pain, palpitations and leg swelling.  Gastrointestinal: Negative for blood in stool, constipation and diarrhea.  Endocrine: Negative for polyuria.  Genitourinary: Negative for dysuria.    Musculoskeletal: Negative for back pain, joint swelling, neck pain and neck stiffness.  Allergic/Immunologic: Negative for immunocompromised state.  Neurological: Positive for headaches. Negative for dizziness and numbness.  Hematological: Does not bruise/bleed easily.  Psychiatric/Behavioral: Positive for sleep disturbance. The patient is not nervous/anxious.      Objective: Vital Signs: BP 105/73   Pulse 84   Resp 14   Ht 5' 7"  (1.702 m)   Wt 165 lb (74.8 kg)   LMP 02/09/2005   BMI 25.84 kg/m   Physical Exam  Ortho Exam awake alert and oriented 3. Comfortable sitting. Full extension right knee. Incision is healed nicely. Flexed about 97 or 8 by my exam today. No instability. No calf pain. Neurovascular exam intact no obvious effusion right knee .no localized areas of tenderness  Specialty Comments:  No specialty comments available.  Imaging: No results found.   PMFS History: Patient Active Problem List   Diagnosis Date Noted  . S/P TKR (total knee replacement) using cement, right 03/21/2017  . Primary osteoarthritis of right knee 11/02/2016  . Nausea without vomiting 09/01/2016  . Chronic diarrhea 12/07/2014  . Segmental colitis with rectal bleeding ([place]) 09/12/2014  . Routine general medical examination at a health care facility 06/05/2014  . Overweight 06/13/2011  . ABNORMAL CHEST XRAY 09/30/2007  . Hyperlipidemia 02/17/2007  . Essential hypertension 01/08/2007  . Osteoarthritis 01/08/2007   Past Medical History:  Diagnosis Date  . Arthritis  back, fingers with joint pain and swelling.  chronic back pain  . Chronic back pain    arthritis   . Diverticulosis   . Elevated cholesterol    takes Niacin daily and Simvastatin  . GERD (gastroesophageal reflux disease) 06/2004   non-specific gastritis on EGD 06/2004  . Headache(784.0)    occasionally  . History of colon polyps 2004, 2009   2004:adenomatous. 2009 hyperplastic.   Marland Kitchen History of hiatal hernia   .  Hypertension    takes Tenoretic and Lisinopril daily  . Mucoid cyst of joint 08/2013   right index finger  . Osteopenia 01/2014   T score -1.1 FRAX 14%/0.5%. Stable from prior DEXA  . PONV (postoperative nausea and vomiting)   . Seasonal allergies   . Stroke ([place]) 1998   x 2 - mild left-sided weakness  . Urge incontinence   . Uterine prolapse     Family History  Problem Relation Age of Onset  . Hypertension Mother   . Heart disease Mother   . Diabetes Father   . Hypertension Father   . Heart disease Father   . Stroke Father   . Diabetes Maternal Aunt   . Diabetes Paternal Grandmother   . Hypertension Paternal Grandfather   . Stroke Paternal Grandfather     Past Surgical History:  Procedure Laterality Date  . BACK SURGERY     x 2  . COLONOSCOPY  2004, 2009, 2014  . FINGER SURGERY    . GUM SURGERY    . GYNECOLOGIC CRYOSURGERY    . HYSTEROSCOPY W/D&C  12/07/2010   with resection of endometrial polyp  . KNEE ARTHROSCOPY Left 2008  . lip biopsy     done at MD office Fri 11/22/13  . MASS EXCISION Right 08/15/2013   Procedure: RIGHT INDEX EXCISION MASS ;  Surgeon: Tennis Must, MD;  Location: [place];  Service: Orthopedics;  Laterality: Right;  . NASAL SEPTUM SURGERY    . OOPHORECTOMY Right 2000  . SHOULDER ARTHROSCOPY WITH OPEN ROTATOR CUFF REPAIR AND DISTAL CLAVICLE ACROMINECTOMY Right 11/26/2013   Procedure: RIGHT SHOULDER ARTHROSCOPY WITH MINI OPEN ROTATOR CUFF REPAIR AND DISTAL CLAVICLE RESECTION, SUBACROMIAL DECOMPRESSION, POSSIBLE [facility] PATCH.;  Surgeon: [name], MD;  Location: [place];  Service: Orthopedics;  Laterality: Right;  . TOTAL KNEE ARTHROPLASTY Right 03/21/2017  . TOTAL KNEE ARTHROPLASTY Right 03/21/2017   Procedure: RIGHT TOTAL KNEE ARTHROPLASTY;  Surgeon: [name], MD;  Location: [place];  Service: Orthopedics;  Laterality: Right;  . TUBAL LIGATION     Social History   Occupational History  . Occupation: Mining engineer    Comment: Disability/Retired  Tobacco Use  . Smoking status: Passive Smoke Exposure - Never Smoker  . Smokeless tobacco: Never Used  Substance and Sexual Activity  . Alcohol use: No    Alcohol/week: 0.0 oz    Frequency: Never  . Drug use: No  . Sexual activity: Yes    Partners: Male    Birth control/protection: Surgical, Post-menopausal    Comment: BTL-1st intercourse 67 yo-More than 5 partners

## 2017-05-22 NOTE — Therapy (Signed)
[facility] [address] [place], [address] Phone: [phone]   Fax:  [phone]  Physical Therapy Treatment  Patient Details  Name: [name] MRN: [id number] Date of Birth: 1950/12/13 Referring Provider: Joni Fears, MD   Encounter Date: 05/22/2017  PT End of Session - 05/22/17 1304    Visit Number  12    Number of Visits  13    Date for PT Re-Evaluation  05/25/17    PT Start Time  1152    PT Stop Time  1242    PT Time Calculation (min)  50 min    Equipment Utilized During Treatment  -- pt single point cane   Activity Tolerance  Patient tolerated treatment well;Patient limited by pain    Behavior During Therapy  [facility] for tasks assessed/performed       Past Medical History:  Diagnosis Date  . Arthritis    back, fingers with joint pain and swelling.  chronic back pain  . Chronic back pain    arthritis   . Diverticulosis   . Elevated cholesterol    takes Niacin daily and Simvastatin  . GERD (gastroesophageal reflux disease) 06/2004   non-specific gastritis on EGD 06/2004  . Headache(784.0)    occasionally  . History of colon polyps 2004, 2009   2004:adenomatous. 2009 hyperplastic.   Marland Kitchen History of hiatal hernia   . Hypertension    takes Tenoretic and Lisinopril daily  . Mucoid cyst of joint 08/2013   right index finger  . Osteopenia 01/2014   T score -1.1 FRAX 14%/0.5%. Stable from prior DEXA  . PONV (postoperative nausea and vomiting)   . Seasonal allergies   . Stroke ([place]) 1998   x 2 - mild left-sided weakness  . Urge incontinence   . Uterine prolapse     Past Surgical History:  Procedure Laterality Date  . BACK SURGERY     x 2  . COLONOSCOPY  2004, 2009, 2014  . FINGER SURGERY    . GUM SURGERY    . GYNECOLOGIC CRYOSURGERY    . HYSTEROSCOPY W/D&C  12/07/2010   with resection of endometrial polyp  . KNEE ARTHROSCOPY Left 2008  . lip biopsy     done at MD office Fri  11/22/13  . MASS EXCISION Right 08/15/2013   Procedure: RIGHT INDEX EXCISION MASS ;  Surgeon: Tennis Must, MD;  Location: [place];  Service: Orthopedics;  Laterality: Right;  . NASAL SEPTUM SURGERY    . OOPHORECTOMY Right 2000  . SHOULDER ARTHROSCOPY WITH OPEN ROTATOR CUFF REPAIR AND DISTAL CLAVICLE ACROMINECTOMY Right 11/26/2013   Procedure: RIGHT SHOULDER ARTHROSCOPY WITH MINI OPEN ROTATOR CUFF REPAIR AND DISTAL CLAVICLE RESECTION, SUBACROMIAL DECOMPRESSION, POSSIBLE [facility] PATCH.;  Surgeon: [name], MD;  Location: [place];  Service: Orthopedics;  Laterality: Right;  . TOTAL KNEE ARTHROPLASTY Right 03/21/2017  . TOTAL KNEE ARTHROPLASTY Right 03/21/2017   Procedure: RIGHT TOTAL KNEE ARTHROPLASTY;  Surgeon: [name], MD;  Location: [place];  Service: Orthopedics;  Laterality: Right;  . TUBAL LIGATION      There were no vitals filed for this visit.  Subjective Assessment - 05/22/17 1153    Subjective  Patient reports she is using cane walker as needed with walking. she is trying to wean off of walker completely. She is using ice and elevation to control pain.     Pertinent History  S/P R TKA on 03/21/2017. Pt had  osteoarthritis and it was "bone on bone." Had 5 home health PT visits, and a little bit of PT at the hospital. R knee hurts currently and feels tight.  PLOF: pt was using a SPC at times when her knee was acting up real bad but did not have to use her SPC all the time.      Limitations  Walking;Standing;House hold activities;Sitting    Patient Stated Goals  I just want to be able to get back to normal. (Has 5 dogs she has to take care of, do chores around the house, be active).     Currently in Pain?  Yes    Pain Score  2     Pain Location  Knee    Pain Orientation  Right    Pain Descriptors / Indicators  Aching;Throbbing    Pain Type  Surgical pain 03/21/2017    Pain Onset  More than a month ago    Pain Frequency  Constant          Objective: Gait: antalgic; instructed to shorten step length with patient reporting decreased right knee pain AAROM right knee flexion0-100degrees pre treatment andup to 105 degrees posttreatment seated at edge of treatment table Palpation:mild warmthand swellingnoted right knee medial aspect with mild to no swelling; decreasedfrom previous session.   Treatment:  Manual therapy: 64mn.: goal improve soft tissue elasticity, improve ROM right knee; improve gait pattern STM performed to right LE quadriceps, hamstrings with superficial techniques, scar mobilization right knee,with patient seated at edge of treatment table with LE's supported and knee flexed  Therapeutic exercise: patient performed with demonstration, instruction, verbal and tactile cues of therapist:goal: independent with home program, improve LEFS, ambulation without AD balance system exercises: for weight shifting forward and back x 2 min. And side to side x 2 min.: improved technique with visual feed back  limits if stability 2 skill levels 66% /86% improved low and 85%/87% with platform unlocked today at #10  Stair climbing instruction and demonstration followed by patient returning demonstration: 2 step up/down using SPC and contact assist of therapist only on descending    Modalities: Electrical stimulation:117m.Russian stim. 10/10 cycle applied (2) electrodes to quadriceps/VMO LE with patient reclined with LE supported on pillow; pt. Performing quad sets/knee extension with each cycle; goal muscle re education High volt estim(2) electrodes applied to medial and lateral aspect of right knee,intensity to tolerance with patient in reclined position with LE supported on pillow goal: pain Ice pack applied to right knee with LE supported onpillowduring estim.: no adverse reactions noted  Patient response to treatment: Improved balance/control with weight shifting exercise on balance system with  repetition and visual feedback. Improved stair climbing with cane with repetition and VC. Knee flexion improved to 105 degrees active motion at end of session.            PT Education - 05/22/17 1158    Education provided  Yes    Education Details  balance system reviewed. stair climbing using cane instruction; adjusted cane to correct height with education to patient    Person(s) Educated  Patient    Methods  Explanation;Demonstration;Verbal cues    Comprehension  Verbalized understanding;Returned demonstration;Verbal cues required          PT Long Term Goals - 04/12/17 1017      PT LONG TERM GOAL #1   Title  Patient will be able to ambulate at least 500 ft without use of AD and no LOB to promote mobility.  Baseline  Pt currently ambulates with rw (04/12/2017)    Time  6    Period  Weeks    Status  New    Target Date  05/25/17      PT LONG TERM GOAL #2   Title  Pt will improve seated R knee extension AROM to -8 degrees or less to promote ability to ambulate, perform standing tasks.     Baseline  seated R knee extension AROM -25 degrees (04/12/2017)    Time  6    Period  Weeks    Status  New    Target Date  05/25/17      PT LONG TERM GOAL #3   Title  Pt will improve seated R knee flexion AROM to at least 115 degrees to promote ability to ambulate, negiotiate steps, perform transfers with less difficulty.     Baseline  seated R knee flexion AROM 77 degrees (04/12/2017)    Time  6    Period  Weeks    Status  New    Target Date  05/25/17      PT LONG TERM GOAL #4   Title  Pt will improve R knee flexion strength to at least 4+/5 and R knee extension strength to at least 5/5 to promote ability to ambulate, perform chores.     Baseline  MMT not yet performed to allow for more healing. (04/12/2017)    Time  6    Period  Weeks    Status  New    Target Date  05/25/17      PT LONG TERM GOAL #5   Title  Patient will improve her LEFS score by at least 9 points as a  demonstration of improved function.    Baseline  15/80 (04/12/2017)    Time  6    Period  Weeks    Status  New    Target Date  05/25/17            Plan - 05/22/17 1453    Clinical Impression Statement  Pateint demonstrates steady progress with ROM, strength right LE s/p TKA x ~ 8 weeks. She continues with pain and stiffness as chief concerns and pain with weight bearing, although this seems to be improving. She will benefit from continued physical therapy intervention to further improve weight shift, strength and ROM in order to achieve maximal function.     Rehab Potential  Fair    Clinical Impairments Affecting Rehab Potential  (-) healing time, pain; (+) husband support    PT Frequency  2x / week    PT Duration  6 weeks    PT Treatment/Interventions  Neuromuscular re-education;Therapeutic exercise;Therapeutic activities;Manual techniques;Aquatic Therapy;Electrical Stimulation;Iontophoresis 1m/ml Dexamethasone;Gait training;Stair training;Balance training;Functional mobility training;Patient/family education;Scar mobilization;Passive range of motion;Dry needling    PT Next Visit Plan  ROM, gait, function, manual techniques, modalities PRN    PT Home Exercise Plan  ROM, standing hip abduction/extension; quad sets seated, supine; elevation/ use of ice for swelling/pain       Patient will benefit from skilled therapeutic intervention in order to improve the following deficits and impairments:  Pain, Difficulty walking, Decreased strength, Decreased scar mobility, Decreased range of motion  Visit Diagnosis: Acute pain of right knee  Muscle weakness (generalized)  Stiffness of right knee, not elsewhere classified  Difficulty in walking, not elsewhere classified     Problem List Patient Active Problem List   Diagnosis Date Noted  . S/P TKR (total knee replacement) using cement,  right 03/21/2017  . Primary osteoarthritis of right knee 11/02/2016  . Nausea without vomiting  09/01/2016  . Chronic diarrhea 12/07/2014  . Segmental colitis with rectal bleeding ([place]) 09/12/2014  . Routine general medical examination at a health care facility 06/05/2014  . Overweight 06/13/2011  . ABNORMAL CHEST XRAY 09/30/2007  . Hyperlipidemia 02/17/2007  . Essential hypertension 01/08/2007  . Osteoarthritis 01/08/2007    Jomarie Longs PT 05/22/2017, 2:55 PM  [facility] [address], [place], [address] Phone: [phone]   Fax:  [phone]  Name: [name] MRN: [id number] Date of Birth: [birth year]

## 2017-05-24 ENCOUNTER — Encounter: Payer: Self-pay | Admitting: Physical Therapy

## 2017-05-24 ENCOUNTER — Ambulatory Visit: Payer: Medicare Other | Admitting: Physical Therapy

## 2017-05-24 DIAGNOSIS — M6281 Muscle weakness (generalized): Secondary | ICD-10-CM

## 2017-05-24 DIAGNOSIS — M25561 Pain in right knee: Secondary | ICD-10-CM

## 2017-05-24 DIAGNOSIS — R262 Difficulty in walking, not elsewhere classified: Secondary | ICD-10-CM | POA: Diagnosis not present

## 2017-05-24 DIAGNOSIS — M25661 Stiffness of right knee, not elsewhere classified: Secondary | ICD-10-CM | POA: Diagnosis not present

## 2017-05-24 NOTE — Therapy (Signed)
[facility] [address] [place], [address] Phone: [phone]   Fax:  [phone]  Physical Therapy Treatment  Patient Details  Name: [name] MRN: [id number] Date of Birth: Sep 23, 1950 Referring Provider: Joni Fears, MD   Encounter Date: 05/24/2017  PT End of Session - 05/24/17 1154    Visit Number  13    Number of Visits  13    Date for PT Re-Evaluation  05/25/17    PT Start Time  1146    PT Stop Time  1245    PT Time Calculation (min)  59 min    Equipment Utilized During Treatment  -- pt SPC   Activity Tolerance  Patient tolerated treatment well;Patient limited by pain    Behavior During Therapy  [facility] for tasks assessed/performed       Past Medical History:  Diagnosis Date  . Arthritis    back, fingers with joint pain and swelling.  chronic back pain  . Chronic back pain    arthritis   . Diverticulosis   . Elevated cholesterol    takes Niacin daily and Simvastatin  . GERD (gastroesophageal reflux disease) 06/2004   non-specific gastritis on EGD 06/2004  . Headache(784.0)    occasionally  . History of colon polyps 2004, 2009   2004:adenomatous. 2009 hyperplastic.   Marland Kitchen History of hiatal hernia   . Hypertension    takes Tenoretic and Lisinopril daily  . Mucoid cyst of joint 08/2013   right index finger  . Osteopenia 01/2014   T score -1.1 FRAX 14%/0.5%. Stable from prior DEXA  . PONV (postoperative nausea and vomiting)   . Seasonal allergies   . Stroke ([place]) 1998   x 2 - mild left-sided weakness  . Urge incontinence   . Uterine prolapse     Past Surgical History:  Procedure Laterality Date  . BACK SURGERY     x 2  . COLONOSCOPY  2004, 2009, 2014  . FINGER SURGERY    . GUM SURGERY    . GYNECOLOGIC CRYOSURGERY    . HYSTEROSCOPY W/D&C  12/07/2010   with resection of endometrial polyp  . KNEE ARTHROSCOPY Left 2008  . lip biopsy     done at MD office Fri 11/22/13  . MASS  EXCISION Right 08/15/2013   Procedure: RIGHT INDEX EXCISION MASS ;  Surgeon: Tennis Must, MD;  Location: [place];  Service: Orthopedics;  Laterality: Right;  . NASAL SEPTUM SURGERY    . OOPHORECTOMY Right 2000  . SHOULDER ARTHROSCOPY WITH OPEN ROTATOR CUFF REPAIR AND DISTAL CLAVICLE ACROMINECTOMY Right 11/26/2013   Procedure: RIGHT SHOULDER ARTHROSCOPY WITH MINI OPEN ROTATOR CUFF REPAIR AND DISTAL CLAVICLE RESECTION, SUBACROMIAL DECOMPRESSION, POSSIBLE [facility] PATCH.;  Surgeon: [name], MD;  Location: [place];  Service: Orthopedics;  Laterality: Right;  . TOTAL KNEE ARTHROPLASTY Right 03/21/2017  . TOTAL KNEE ARTHROPLASTY Right 03/21/2017   Procedure: RIGHT TOTAL KNEE ARTHROPLASTY;  Surgeon: [name], MD;  Location: [place];  Service: Orthopedics;  Laterality: Right;  . TUBAL LIGATION      There were no vitals filed for this visit.  Subjective Assessment - 05/24/17 1149    Subjective  Patient reports she drove for the first time to therapy today. She continues to wean off using walker and continues with stiffness right knee.  She is also having pain in left knee that limits ability to walk without diffficulty   Pertinent History  S/P R TKA on 03/21/2017. Pt had osteoarthritis and it was "bone on bone." Had 5 home health PT visits, and a little bit of PT at the hospital. R knee hurts currently and feels tight.  PLOF: pt was using a SPC at times when her knee was acting up real bad but did not have to use her SPC all the time.      Limitations  Walking;Standing;House hold activities;Sitting    Patient Stated Goals  I just want to be able to get back to normal. (Has 5 dogs she has to take care of, do chores around the house, be active).     Currently in Pain?  Yes    Pain Score  1     Pain Location  Knee    Pain Orientation  Right    Pain Descriptors / Indicators  Tightness    Pain Type  Acute pain;Surgical pain 03/28/17    Pain Onset  More than a month ago     Pain Frequency  Constant          Objective: Gait: antalgic AAROM right knee flexion0- 95degrees pre treatment andup to 100 degrees posttreatment seated at edge of treatment table Palpation:mild warmthand swellingnoted right knee medial aspect with mild to no swelling; increasedfrom previous session.   Treatment:  Manual therapy:31mn.: goal improve soft tissue elasticity, improve ROM right knee; improve gait pattern STM performed to right LE quadriceps, hamstrings with superficial techniques, scar mobilization right knee,with patient seated at edge of treatment table with LE's supported and knee flexed  Therapeutic exercise: patient performed with demonstration, instruction, verbal and tactile cues of therapist:goal: independent with home program, improve LEFS, ambulation without AD  Sitting: Knee flexion isometric  knee flexion/extension active and active assisted with therapist to facilitate knee flexion with less difficulty  NuStep x 5 min with assistance to get on with good hip alignment and position; level #3   Modalities: Electrical stimulation:118m.Russian stim. 10/10 cycle applied (2) electrodes to quadriceps/VMO LE with patient reclined with LE supported on pillow; pt. Performing quad sets/knee extension with each cycle; goal muscle re education High volt estim(2) electrodes applied to medial and lateral aspect of right knee,intensity to tolerance with patient in reclined position with LE supported on pillow goal: pain Ice pack applied to right knee with LE supported onpillowduring estim.: no adverse reactions noted moist heat applied to left knee (in conjunction with estim to right knee): goal: pain; no adverse reaction noted  Patient response to treatment:Exercise limited by pain today. Improved ROM 5 degrees following STM, treatment. Continued with left knee pain and right knee stiffness at end of session.      PT Education - 05/24/17  1152    Education provided  Yes    Education Details  exercise instruction for balance, strength for technique    Person(s) Educated  Patient    Methods  Explanation;Demonstration;Verbal cues    Comprehension  Verbalized understanding;Returned demonstration;Verbal cues required          PT Long Term Goals - 04/12/17 1017      PT LONG TERM GOAL #1   Title  Patient will be able to ambulate at least 500 ft without use of AD and no LOB to promote mobility.     Baseline  Pt currently ambulates with rw (04/12/2017)    Time  6    Period  Weeks    Status  New    Target Date  05/25/17  PT LONG TERM GOAL #2   Title  Pt will improve seated R knee extension AROM to -8 degrees or less to promote ability to ambulate, perform standing tasks.     Baseline  seated R knee extension AROM -25 degrees (04/12/2017)    Time  6    Period  Weeks    Status  New    Target Date  05/25/17      PT LONG TERM GOAL #3   Title  Pt will improve seated R knee flexion AROM to at least 115 degrees to promote ability to ambulate, negiotiate steps, perform transfers with less difficulty.     Baseline  seated R knee flexion AROM 77 degrees (04/12/2017)    Time  6    Period  Weeks    Status  New    Target Date  05/25/17      PT LONG TERM GOAL #4   Title  Pt will improve R knee flexion strength to at least 4+/5 and R knee extension strength to at least 5/5 to promote ability to ambulate, perform chores.     Baseline  MMT not yet performed to allow for more healing. (04/12/2017)    Time  6    Period  Weeks    Status  New    Target Date  05/25/17      PT LONG TERM GOAL #5   Title  Patient will improve her LEFS score by at least 9 points as a demonstration of improved function.    Baseline  15/80 (04/12/2017)    Time  6    Period  Weeks    Status  New    Target Date  05/25/17            Plan - 05/24/17 1247    Clinical Impression Statement  Patient is progressing steadily with goals and is improving  function with daily activities, driving. She continues with stiffness, limited ROM and strength right LE and pain in left knee and will benefit from additional physical therapy intervention to achieve goals.     Rehab Potential  Fair    Clinical Impairments Affecting Rehab Potential  (-) healing time, pain; (+) husband support    PT Frequency  2x / week    PT Duration  6 weeks    PT Treatment/Interventions  Neuromuscular re-education;Therapeutic exercise;Therapeutic activities;Manual techniques;Aquatic Therapy;Electrical Stimulation;Iontophoresis 40m/ml Dexamethasone;Gait training;Stair training;Balance training;Functional mobility training;Patient/family education;Scar mobilization;Passive range of motion;Dry needling    PT Next Visit Plan  ROM, gait, function, manual techniques, modalities PRN    PT Home Exercise Plan  ROM, standing hip abduction/extension; quad sets seated, supine; elevation/ use of ice for swelling/pain       Patient will benefit from skilled therapeutic intervention in order to improve the following deficits and impairments:  Pain, Difficulty walking, Decreased strength, Decreased scar mobility, Decreased range of motion  Visit Diagnosis: Acute pain of right knee  Muscle weakness (generalized)  Stiffness of right knee, not elsewhere classified  Difficulty in walking, not elsewhere classified     Problem List Patient Active Problem List   Diagnosis Date Noted  . S/P TKR (total knee replacement) using cement, right 03/21/2017  . Primary osteoarthritis of right knee 11/02/2016  . Nausea without vomiting 09/01/2016  . Chronic diarrhea 12/07/2014  . Segmental colitis with rectal bleeding ([place] 09/12/2014  . Routine general medical examination at a health care facility 06/05/2014  . Overweight 06/13/2011  . ABNORMAL CHEST XRAY 09/30/2007  . Hyperlipidemia  02/17/2007  . Essential hypertension 01/08/2007  . Osteoarthritis 01/08/2007    Jomarie Longs  PT 05/25/2017, 12:51 PM  [facility] [address] [place], [address] Phone: [phone]   Fax:  [phone]  Name: [name] MRN: [id number] Date of Birth: [birth year]

## 2017-05-29 ENCOUNTER — Ambulatory Visit: Payer: Medicare Other | Admitting: Physical Therapy

## 2017-05-29 ENCOUNTER — Encounter: Payer: Self-pay | Admitting: Physical Therapy

## 2017-05-29 DIAGNOSIS — M25561 Pain in right knee: Secondary | ICD-10-CM

## 2017-05-29 DIAGNOSIS — M6281 Muscle weakness (generalized): Secondary | ICD-10-CM | POA: Diagnosis not present

## 2017-05-29 DIAGNOSIS — R262 Difficulty in walking, not elsewhere classified: Secondary | ICD-10-CM

## 2017-05-29 DIAGNOSIS — M25661 Stiffness of right knee, not elsewhere classified: Secondary | ICD-10-CM | POA: Diagnosis not present

## 2017-05-29 NOTE — Therapy (Signed)
[facility] [address], [place], [address] Phone: [phone]   Fax:  [phone]  Physical Therapy Treatment  Patient Details  Name: [name] MRN: [id number] Date of Birth: [birth year] Referring Provider: Joni Fears, MD   Encounter Date: 05/29/2017  PT End of Session - 05/29/17 1154    Visit Number  14    Number of Visits  24    Date for PT Re-Evaluation  07/10/17    PT Start Time  1148    PT Stop Time  1234    PT Time Calculation (min)  46 min    Equipment Utilized During Treatment  -- pt SPC    Activity Tolerance  Patient tolerated treatment well;Patient limited by pain    Behavior During Therapy  [facility] for tasks assessed/performed       Past Medical History:  Diagnosis Date  . Arthritis    back, fingers with joint pain and swelling.  chronic back pain  . Chronic back pain    arthritis   . Diverticulosis   . Elevated cholesterol    takes Niacin daily and Simvastatin  . GERD (gastroesophageal reflux disease) 06/2004   non-specific gastritis on EGD 06/2004  . Headache(784.0)    occasionally  . History of colon polyps 2004, 2009   2004:adenomatous. 2009 hyperplastic.   Marland Kitchen History of hiatal hernia   . Hypertension    takes Tenoretic and Lisinopril daily  . Mucoid cyst of joint 08/2013   right index finger  . Osteopenia 01/2014   T score -1.1 FRAX 14%/0.5%. Stable from prior DEXA  . PONV (postoperative nausea and vomiting)   . Seasonal allergies   . Stroke ([place]) 1998   x 2 - mild left-sided weakness  . Urge incontinence   . Uterine prolapse     Past Surgical History:  Procedure Laterality Date  . BACK SURGERY     x 2  . COLONOSCOPY  2004, 2009, 2014  . FINGER SURGERY    . GUM SURGERY    . GYNECOLOGIC CRYOSURGERY    . HYSTEROSCOPY W/D&C  12/07/2010   with resection of endometrial polyp  . KNEE ARTHROSCOPY Left 2008  . lip biopsy     done at MD office Fri 11/22/13  . MASS  EXCISION Right 08/15/2013   Procedure: RIGHT INDEX EXCISION MASS ;  Surgeon: Tennis Must, MD;  Location: [place];  Service: Orthopedics;  Laterality: Right;  . NASAL SEPTUM SURGERY    . OOPHORECTOMY Right 2000  . SHOULDER ARTHROSCOPY WITH OPEN ROTATOR CUFF REPAIR AND DISTAL CLAVICLE ACROMINECTOMY Right 11/26/2013   Procedure: RIGHT SHOULDER ARTHROSCOPY WITH MINI OPEN ROTATOR CUFF REPAIR AND DISTAL CLAVICLE RESECTION, SUBACROMIAL DECOMPRESSION, POSSIBLE [facility] PATCH.;  Surgeon: [name], MD;  Location: [place];  Service: Orthopedics;  Laterality: Right;  . TOTAL KNEE ARTHROPLASTY Right 03/21/2017  . TOTAL KNEE ARTHROPLASTY Right 03/21/2017   Procedure: RIGHT TOTAL KNEE ARTHROPLASTY;  Surgeon: [name], MD;  Location: [place];  Service: Orthopedics;  Laterality: Right;  . TUBAL LIGATION      There were no vitals filed for this visit.  Subjective Assessment - 05/29/17 1152    Subjective  Patient reports she is feeling better and feels sharp stabbing pain inferior to knee.  She is still transitioning off of rolling walker and has to use walker at home if up quite a bit   Pertinent History  S/P R TKA on  03/21/2017. Pt had osteoarthritis and it was "bone on bone." Had 5 home health PT visits, and a little bit of PT at the hospital. R knee hurts currently and feels tight.  PLOF: pt was using a SPC at times when her knee was acting up real bad but did not have to use her SPC all the time.      Limitations  Walking;Standing;House hold activities;Sitting    Patient Stated Goals  I just want to be able to get back to normal. (Has 5 dogs she has to take care of, do chores around the house, be active).     Currently in Pain?  Yes    Pain Score  2     Pain Location  Knee    Pain Orientation  Right    Pain Descriptors / Indicators  Tightness;Aching;Sharp    Pain Type  Surgical pain;Acute pain 03/28/2017    Pain Onset  More than a month ago    Pain Frequency  Intermittent        Objective: Gait: antalgic AAROM right knee flexion0- 92degrees pre treatment andup to 100 degrees posttreatment seated at edge of treatment table Palpation:mild warmthand swellingnoted right knee medial aspect withmild to noswelling; increasedfrom previous session.   Treatment:  Manual therapy:46mn.: goal improve soft tissue elasticity, improve ROM right knee; improve gait pattern STM performed to right LE quadriceps, hamstrings with superficial techniques, scar mobilization right knee,with patient seated at edge of treatment table with LE's supported and knee flexed  Therapeutic exercise: patient performed with demonstration, instruction, verbal and tactile cues of therapist:goal: independent with home program, improve LEFS, ambulation without AD  Sitting: Knee flexion isometric end range x 10 reps  Knee flexion with green resistive band x 15 reps with assistance of therapist and VC  knee flexion/extension active and active assisted with therapist to facilitate knee flexion with less difficulty Balance system for weight shifting 1-2 min side to side and forward and back Limits of stability low medium and high skill levels with platform unlocked #9: 94%, 88%, 90% Balance beam side stepping x 2 min. Leg press 25# x 15 bilateral LE's, 35# x 10 reps with assit of therapist; 25# x 25reps  NuStep x 8 min with assistance to get on with good hip alignment and position; level #3; pain monitored throughout, no worse    Modalities: Ice massage performed to right knee pre exercise x 1-2 min. With instruction given for home to perform 1-2x/day written instructions given with precaution for frost bite  Patient response to treatment:patient with improved knee flexion with less pain following STM, ice massage today. Patient with improved weight shifting as demonstrated with increased accuracy on balance system. Patient required moderate cuing and guidance to perform  exercises with good alignment and proper intensity.      PT Education - 05/29/17 1153    Education provided  Yes    Education Details  gait pattern, re assessed home exercises    Person(s) Educated  Patient    Methods  Explanation;Demonstration;Verbal cues    Comprehension  Verbal cues required;Returned demonstration;Verbalized understanding          PT Long Term Goals - 05/29/17 1648      PT LONG TERM GOAL #1   Title  Patient will be able to ambulate at least 500 ft without use of AD and no LOB to promote mobility.     Baseline  Pt currently ambulates with rw (04/12/2017); patient ambulating with SPC with pain right  and left LE knees and is using rolling walker as needed (05/29/17)    Status  Revised    Target Date  06/26/17      PT LONG TERM GOAL #2   Title  Pt will improve seated R knee extension AROM to -8 degrees or less to promote ability to ambulate, perform standing tasks.     Baseline  seated R knee extension AROM -25 degrees (04/12/2017)    Time  --    Period  --    Status  Achieved      PT LONG TERM GOAL #3   Title  Pt will improve seated R knee flexion AROM to at least 115 degrees to promote ability to ambulate, negiotiate steps, perform transfers with less difficulty.     Baseline  seated R knee flexion AROM 77 degrees (04/12/2017); AAROM right knee flexion 0-  up to 100 degrees with pain limiting further motion (05/29/17)    Time  --    Period  --    Status  Revised    Target Date  07/10/17      PT LONG TERM GOAL #4   Title  Pt will improve R knee flexion strength to at least 4+/5 and R knee extension strength to at least 5/5 to promote ability to ambulate, perform chores.     Baseline  right knee flexion 4-/5, knee extension improving 4-/5: (05/29/17)    Time  --    Period  --    Status  Revised    Target Date  07/10/17      PT LONG TERM GOAL #5   Title  Patient will improve her LEFS score to 50 or more points as a demonstration of improved function with daily  tasks.    Baseline  15/80 (04/12/2017); LEFS 05/29/17 30/80    Time  --    Period  --    Status  Revised    Target Date  07/10/17            Plan - 05/29/17 1226    Clinical Impression Statement  Pateint demonstrates steady progress with ROM right knee up to 100 degrees flexion and improving  strength right LE s/p TKA. She continues with pain and stiffness as chief concerns and pain with weight bearing. She is currently using a SPC and transitionin off of rolling walker as able. Patient demonstrated improvement with ROM by 5 degrees following ice massage and exercise, improved weight shifting with increased accuracy >85% on balance system with repetition and visual cues. She will benefit from continued physical therapy intervention to further improve weight shift, strength and ROM in order to achieve maximal function    Rehab Potential  Fair    Clinical Impairments Affecting Rehab Potential  (-) healing time, pain; (+) husband support, acute condition, motivated    PT Frequency  2x / week    PT Duration  6 weeks    PT Treatment/Interventions  Neuromuscular re-education;Therapeutic exercise;Therapeutic activities;Manual techniques;Aquatic Therapy;Electrical Stimulation;Iontophoresis 71m/ml Dexamethasone;Gait training;Stair training;Balance training;Functional mobility training;Patient/family education;Scar mobilization;Passive range of motion;Dry needling    PT Next Visit Plan  ROM, gait, function, manual techniques, modalities PRN    PT Home Exercise Plan  ROM, standing hip abduction/extension; quad sets seated, knee flexion with resistive band; elevation/ use of ice for swelling/pain    Consulted and Agree with Plan of Care  Patient       Patient will benefit from skilled therapeutic intervention in order to improve the following deficits  and impairments:  Pain, Difficulty walking, Decreased strength, Decreased scar mobility, Decreased range of motion  Visit Diagnosis: Acute pain of right  knee - Plan: PT plan of care cert/re-cert  Muscle weakness (generalized) - Plan: PT plan of care cert/re-cert  Stiffness of right knee, not elsewhere classified - Plan: PT plan of care cert/re-cert  Difficulty in walking, not elsewhere classified - Plan: PT plan of care cert/re-cert     Problem List Patient Active Problem List   Diagnosis Date Noted  . S/P TKR (total knee replacement) using cement, right 03/21/2017  . Primary osteoarthritis of right knee 11/02/2016  . Nausea without vomiting 09/01/2016  . Chronic diarrhea 12/07/2014  . Segmental colitis with rectal bleeding ([place]) 09/12/2014  . Routine general medical examination at a health care facility 06/05/2014  . Overweight 06/13/2011  . ABNORMAL CHEST XRAY 09/30/2007  . Hyperlipidemia 02/17/2007  . Essential hypertension 01/08/2007  . Osteoarthritis 01/08/2007    Jomarie Longs PT 05/29/2017, 5:27 PM  [facility] [address], [place], [address] Phone: [phone]   Fax:  [phone]  Name: [name] MRN: [id number] Date of Birth: Jan 22, 1951

## 2017-05-31 ENCOUNTER — Ambulatory Visit: Payer: Medicare Other | Admitting: Physical Therapy

## 2017-05-31 ENCOUNTER — Encounter: Payer: Self-pay | Admitting: Physical Therapy

## 2017-05-31 DIAGNOSIS — M25661 Stiffness of right knee, not elsewhere classified: Secondary | ICD-10-CM | POA: Diagnosis not present

## 2017-05-31 DIAGNOSIS — R262 Difficulty in walking, not elsewhere classified: Secondary | ICD-10-CM | POA: Diagnosis not present

## 2017-05-31 DIAGNOSIS — M6281 Muscle weakness (generalized): Secondary | ICD-10-CM | POA: Diagnosis not present

## 2017-05-31 DIAGNOSIS — M25561 Pain in right knee: Secondary | ICD-10-CM | POA: Diagnosis not present

## 2017-05-31 NOTE — Therapy (Signed)
[place] [facility] [address] [place], [address] Phone: [phone]   Fax:  [phone]  Physical Therapy Treatment  Patient Details  Name: [name] MRN: [id number] Date of Birth: March 15, 1951 Referring Provider: Joni Fears, MD   Encounter Date: 05/31/2017  PT End of Session - 05/31/17 1149    Visit Number  15    Number of Visits  24    Date for PT Re-Evaluation  07/10/17    PT Start Time  0174    PT Stop Time  1236    PT Time Calculation (min)  51 min    Equipment Utilized During Treatment  -- pt SPC    Activity Tolerance  Patient tolerated treatment well;Patient limited by pain    Behavior During Therapy  [facility] for tasks assessed/performed       Past Medical History:  Diagnosis Date  . Arthritis    back, fingers with joint pain and swelling.  chronic back pain  . Chronic back pain    arthritis   . Diverticulosis   . Elevated cholesterol    takes Niacin daily and Simvastatin  . GERD (gastroesophageal reflux disease) 06/2004   non-specific gastritis on EGD 06/2004  . Headache(784.0)    occasionally  . History of colon polyps 2004, 2009   2004:adenomatous. 2009 hyperplastic.   Marland Kitchen History of hiatal hernia   . Hypertension    takes Tenoretic and Lisinopril daily  . Mucoid cyst of joint 08/2013   right index finger  . Osteopenia 01/2014   T score -1.1 FRAX 14%/0.5%. Stable from prior DEXA  . PONV (postoperative nausea and vomiting)   . Seasonal allergies   . Stroke ([place]) 1998   x 2 - mild left-sided weakness  . Urge incontinence   . Uterine prolapse     Past Surgical History:  Procedure Laterality Date  . BACK SURGERY     x 2  . COLONOSCOPY  2004, 2009, 2014  . FINGER SURGERY    . GUM SURGERY    . GYNECOLOGIC CRYOSURGERY    . HYSTEROSCOPY W/D&C  12/07/2010   with resection of endometrial polyp  . KNEE ARTHROSCOPY Left 2008  . lip biopsy     done at MD office Fri 11/22/13  . MASS  EXCISION Right 08/15/2013   Procedure: RIGHT INDEX EXCISION MASS ;  Surgeon: Tennis Must, MD;  Location: [place];  Service: Orthopedics;  Laterality: Right;  . NASAL SEPTUM SURGERY    . OOPHORECTOMY Right 2000  . SHOULDER ARTHROSCOPY WITH OPEN ROTATOR CUFF REPAIR AND DISTAL CLAVICLE ACROMINECTOMY Right 11/26/2013   Procedure: RIGHT SHOULDER ARTHROSCOPY WITH MINI OPEN ROTATOR CUFF REPAIR AND DISTAL CLAVICLE RESECTION, SUBACROMIAL DECOMPRESSION, POSSIBLE [facility] PATCH.;  Surgeon: [name], MD;  Location: [place];  Service: Orthopedics;  Laterality: Right;  . TOTAL KNEE ARTHROPLASTY Right 03/21/2017  . TOTAL KNEE ARTHROPLASTY Right 03/21/2017   Procedure: RIGHT TOTAL KNEE ARTHROPLASTY;  Surgeon: [name], MD;  Location: [place];  Service: Orthopedics;  Laterality: Right;  . TUBAL LIGATION      There were no vitals filed for this visit.  Subjective Assessment - 05/31/17 1144    Subjective  Patient reports she is still having primarily tightness in right knee.     Pertinent History  S/P R TKA on 03/21/2017. Pt had osteoarthritis and it was "bone on bone." Had 5 home health PT visits, and a little bit of PT  at the hospital. R knee hurts currently and feels tight.  PLOF: pt was using a SPC at times when her knee was acting up real bad but did not have to use her SPC all the time.      Limitations  Walking;Standing;House hold activities;Sitting    Patient Stated Goals  I just want to be able to get back to normal. (Has 5 dogs she has to take care of, do chores around the house, be active).     Currently in Pain?  Other (Comment) tightness is primary problem ("10")         Objective: Gait: antalgic AAROM right knee flexion0-100degrees pre treatment andup to 102degrees posttreatment seated at edge of treatment table Palpation:mild warmthand swellingnoted right knee medial aspect withmild to noswelling;decresasedfrom previous session.   Treatment:   Manual therapy:63mn.: goal improve soft tissue elasticity, improve ROM right knee; improve gait pattern STM performed to right knee scar mobilization and patella mobilizationwith patient seated at edge of treatment table with LE's supported and knee flexed  Therapeutic exercise: patient performed with demonstration, instruction, verbal and tactile cues of therapist:goal: independent with home program, improve LEFS, ambulation without AD  Sitting: Knee flexion isometric end range x 10 reps Seated hip adduction with ball and glute sets x 10 reps 3 second holds Hip abduction (clam) green resistive band x 15 reps  Knee flexion with green resistive band x 15 reps with assistance of therapist and VC Ankle DF bilateral with green resistive band x 15, single right x 10  Balance system for weight shifting 2 min side to side and forward and back Limits of stability low medium and high skill levels with platform unlocked #8: 80% up to 94% with repetition with all skill levels Leg press 25# x 15 bilateral LE's, 35# x 10 reps with assit of therapist; 20# x 25reps  NuStep x 8 min with assistance to get on with good hip alignment and position; level #4; pain level monitored throughout ( < 3/10)   Modalities: Ice massage performed to right knee pre exercise x 1-2 min. No adverse reactions noted  Patient response to treatment: Patient required guidance, minimal VC and assistance for exercises for correct technique and alignment. Improved knee flexion with less pain in knee following ice massage. Improved accuracy on balance system as compared to previous session.     PT Education - 05/31/17 1149    Education provided  Yes    Education Details  exercise technique    Person(s) Educated  Patient    Methods  Explanation;Demonstration;Verbal cues    Comprehension  Verbalized understanding;Returned demonstration;Verbal cues required          PT Long Term Goals - 05/29/17 1648      PT  LONG TERM GOAL #1   Title  Patient will be able to ambulate at least 500 ft without use of AD and no LOB to promote mobility.     Baseline  Pt currently ambulates with rw (04/12/2017); patient ambulating with SPC with pain right and left LE knees and is using rolling walker as needed (05/29/17)    Status  Revised    Target Date  06/26/17      PT LONG TERM GOAL #2   Title  Pt will improve seated R knee extension AROM to -8 degrees or less to promote ability to ambulate, perform standing tasks.     Baseline  seated R knee extension AROM -25 degrees (04/12/2017)    Time  --  Period  --    Status  Achieved      PT LONG TERM GOAL #3   Title  Pt will improve seated R knee flexion AROM to at least 115 degrees to promote ability to ambulate, negiotiate steps, perform transfers with less difficulty.     Baseline  seated R knee flexion AROM 77 degrees (04/12/2017); AAROM right knee flexion 0-  up to 100 degrees with pain limiting further motion (05/29/17)    Time  --    Period  --    Status  Revised    Target Date  07/10/17      PT LONG TERM GOAL #4   Title  Pt will improve R knee flexion strength to at least 4+/5 and R knee extension strength to at least 5/5 to promote ability to ambulate, perform chores.     Baseline  right knee flexion 4-/5, knee extension improving 4-/5: (05/29/17)    Time  --    Period  --    Status  Revised    Target Date  07/10/17      PT LONG TERM GOAL #5   Title  Patient will improve her LEFS score to 50 or more points as a demonstration of improved function with daily tasks.    Baseline  15/80 (04/12/2017); LEFS 05/29/17 30/80    Time  --    Period  --    Status  Revised    Target Date  07/10/17            Plan - 05/31/17 1319    Clinical Impression Statement  Patient continues to progress with goals for ROM and strength for improving function with daily tasks. She has primary concern of tightness in right knee that is limiting her with function. She will benefit  from continued physical therapy intervention to address limitations and achieve goals.     Rehab Potential  Fair    Clinical Impairments Affecting Rehab Potential  (-) healing time, pain; (+) husband support, acute condition, motivated    PT Frequency  2x / week    PT Duration  6 weeks    PT Treatment/Interventions  Neuromuscular re-education;Therapeutic exercise;Therapeutic activities;Manual techniques;Aquatic Therapy;Electrical Stimulation;Iontophoresis 50m/ml Dexamethasone;Gait training;Stair training;Balance training;Functional mobility training;Patient/family education;Scar mobilization;Passive range of motion;Dry needling    PT Next Visit Plan  ROM, gait, function, manual techniques, modalities PRN    PT Home Exercise Plan  ROM, standing hip abduction/extension; quad sets seated, knee flexion with resistive band; elevation/ use of ice for swelling/pain       Patient will benefit from skilled therapeutic intervention in order to improve the following deficits and impairments:  Pain, Difficulty walking, Decreased strength, Decreased scar mobility, Decreased range of motion  Visit Diagnosis: Acute pain of right knee  Muscle weakness (generalized)  Stiffness of right knee, not elsewhere classified  Difficulty in walking, not elsewhere classified     Problem List Patient Active Problem List   Diagnosis Date Noted  . S/P TKR (total knee replacement) using cement, right 03/21/2017  . Primary osteoarthritis of right knee 11/02/2016  . Nausea without vomiting 09/01/2016  . Chronic diarrhea 12/07/2014  . Segmental colitis with rectal bleeding ([place] 09/12/2014  . Routine general medical examination at a health care facility 06/05/2014  . Overweight 06/13/2011  . ABNORMAL CHEST XRAY 09/30/2007  . Hyperlipidemia 02/17/2007  . Essential hypertension 01/08/2007  . Osteoarthritis 01/08/2007    BJomarie LongsPT 06/01/2017, 1:22 PM  [facility] [address] [place], [address] Phone: [phone]   Fax:  [phone]  Name: [name] MRN: [id number] Date of Birth: 11-25-1950

## 2017-06-05 ENCOUNTER — Ambulatory Visit: Payer: Medicare Other | Admitting: Physical Therapy

## 2017-06-05 ENCOUNTER — Encounter: Payer: Self-pay | Admitting: Physical Therapy

## 2017-06-05 DIAGNOSIS — M25661 Stiffness of right knee, not elsewhere classified: Secondary | ICD-10-CM | POA: Diagnosis not present

## 2017-06-05 DIAGNOSIS — M6281 Muscle weakness (generalized): Secondary | ICD-10-CM | POA: Diagnosis not present

## 2017-06-05 DIAGNOSIS — R262 Difficulty in walking, not elsewhere classified: Secondary | ICD-10-CM

## 2017-06-05 DIAGNOSIS — M25561 Pain in right knee: Secondary | ICD-10-CM

## 2017-06-05 NOTE — Therapy (Signed)
[place] [facility] [address] [place], [address] Phone: [phone]   Fax:  [phone]  Physical Therapy Treatment  Patient Details  Name: [name] MRN: [id number] Date of Birth: 1951-01-27 Referring Provider: Joni Fears, MD   Encounter Date: 06/05/2017  PT End of Session - 06/05/17 1156    Visit Number  16    Number of Visits  24    Date for PT Re-Evaluation  07/10/17    PT Start Time  1148    PT Stop Time  1237    PT Time Calculation (min)  49 min    Equipment Utilized During Treatment  -- pt SPC    Activity Tolerance  Patient tolerated treatment well;Patient limited by pain    Behavior During Therapy  [facility] for tasks assessed/performed       Past Medical History:  Diagnosis Date  . Arthritis    back, fingers with joint pain and swelling.  chronic back pain  . Chronic back pain    arthritis   . Diverticulosis   . Elevated cholesterol    takes Niacin daily and Simvastatin  . GERD (gastroesophageal reflux disease) 06/2004   non-specific gastritis on EGD 06/2004  . Headache(784.0)    occasionally  . History of colon polyps 2004, 2009   2004:adenomatous. 2009 hyperplastic.   Marland Kitchen History of hiatal hernia   . Hypertension    takes Tenoretic and Lisinopril daily  . Mucoid cyst of joint 08/2013   right index finger  . Osteopenia 01/2014   T score -1.1 FRAX 14%/0.5%. Stable from prior DEXA  . PONV (postoperative nausea and vomiting)   . Seasonal allergies   . Stroke ([place]) 1998   x 2 - mild left-sided weakness  . Urge incontinence   . Uterine prolapse     Past Surgical History:  Procedure Laterality Date  . BACK SURGERY     x 2  . COLONOSCOPY  2004, 2009, 2014  . FINGER SURGERY    . GUM SURGERY    . GYNECOLOGIC CRYOSURGERY    . HYSTEROSCOPY W/D&C  12/07/2010   with resection of endometrial polyp  . KNEE ARTHROSCOPY Left 2008  . lip biopsy     done at MD office Fri 11/22/13  . MASS  EXCISION Right 08/15/2013   Procedure: RIGHT INDEX EXCISION MASS ;  Surgeon: Tennis Must, MD;  Location: [place];  Service: Orthopedics;  Laterality: Right;  . NASAL SEPTUM SURGERY    . OOPHORECTOMY Right 2000  . SHOULDER ARTHROSCOPY WITH OPEN ROTATOR CUFF REPAIR AND DISTAL CLAVICLE ACROMINECTOMY Right 11/26/2013   Procedure: RIGHT SHOULDER ARTHROSCOPY WITH MINI OPEN ROTATOR CUFF REPAIR AND DISTAL CLAVICLE RESECTION, SUBACROMIAL DECOMPRESSION, POSSIBLE [facility] PATCH.;  Surgeon: [name], MD;  Location: [place];  Service: Orthopedics;  Laterality: Right;  . TOTAL KNEE ARTHROPLASTY Right 03/21/2017  . TOTAL KNEE ARTHROPLASTY Right 03/21/2017   Procedure: RIGHT TOTAL KNEE ARTHROPLASTY;  Surgeon: [name], MD;  Location: [place];  Service: Orthopedics;  Laterality: Right;  . TUBAL LIGATION      There were no vitals filed for this visit.  Subjective Assessment - 06/05/17 1153    Subjective  Patient reports she is having some better days than others. She had a stumble on Friday and caught herself with her right leg, did not fall. She put ice on it and rested.     Pertinent History  S/P R TKA on  03/21/2017. Pt had osteoarthritis and it was "bone on bone." Had 5 home health PT visits, and a little bit of PT at the hospital. R knee hurts currently and feels tight.  PLOF: pt was using a SPC at times when her knee was acting up real bad but did not have to use her SPC all the time.      Limitations  Walking;Standing;House hold activities;Sitting    Patient Stated Goals  I just want to be able to get back to normal. (Has 5 dogs she has to take care of, do chores around the house, be active).     Currently in Pain?  Other (Comment)         Objective: Gait: antalgic AAROM right knee flexion0-95degrees pre treatment andup to 100degrees posttreatment seated at edge of treatment table Palpation:moderate swellingnoted right knee medial aspect   Treatment:   Manual therapy:33mn.: goal improve soft tissue elasticity, improve ROM right knee; improve gait pattern STM performed to right knee scar mobilization and patella mobilizationwith patient seated at edge of treatment table with LE's supported and knee flexed  Therapeutic exercise: patient performed with demonstration, instruction, verbal and tactile cues of therapist:goal: independent with home program, improve LEFS, ambulation without AD  Sitting: Knee flexion isometricend range x 10 reps  Knee flexion with green resistive band x 15 reps with assistance of therapist and VC Leg press 25# x 15 bilateral LE's, 35# x 15 reps with assit of therapist; 20# x 25reps  NuStep x142m with assistance to get on with good hip alignment and position; level #3; pain level monitored throughout ( 1/10)   Patient response to treatment: Patient required guidance, minimal cuing and assistance for exercises. Limited exercises due to increased swelling and patient with pain in both knees with weight bearing activities. Improved ability to walk following treatment.       PT Education - 06/05/17 1155    Education provided  Yes    Education Details  exercise technique; HEP: move right knee flexion/ extension and ankle pumps every hour. Elevate right LE when sitting; up and moving 1 hour at a time, band for knee flexion x 15 reps 3x/day   Person(s) Educated  Patient    Methods  Explanation;Demonstration;Verbal cues    Comprehension  Verbalized understanding;Verbal cues required;Returned demonstration          PT Long Term Goals - 05/29/17 1648      PT LONG TERM GOAL #1   Title  Patient will be able to ambulate at least 500 ft without use of AD and no LOB to promote mobility.     Baseline  Pt currently ambulates with rw (04/12/2017); patient ambulating with SPC with pain right and left LE knees and is using rolling walker as needed (05/29/17)    Status  Revised    Target Date  06/26/17       PT LONG TERM GOAL #2   Title  Pt will improve seated R knee extension AROM to -8 degrees or less to promote ability to ambulate, perform standing tasks.     Baseline  seated R knee extension AROM -25 degrees (04/12/2017)    Time  --    Period  --    Status  Achieved      PT LONG TERM GOAL #3   Title  Pt will improve seated R knee flexion AROM to at least 115 degrees to promote ability to ambulate, negiotiate steps, perform transfers with less difficulty.  Baseline  seated R knee flexion AROM 77 degrees (04/12/2017); AAROM right knee flexion 0-  up to 100 degrees with pain limiting further motion (05/29/17)    Time  --    Period  --    Status  Revised    Target Date  07/10/17      PT LONG TERM GOAL #4   Title  Pt will improve R knee flexion strength to at least 4+/5 and R knee extension strength to at least 5/5 to promote ability to ambulate, perform chores.     Baseline  right knee flexion 4-/5, knee extension improving 4-/5: (05/29/17)    Time  --    Period  --    Status  Revised    Target Date  07/10/17      PT LONG TERM GOAL #5   Title  Patient will improve her LEFS score to 50 or more points as a demonstration of improved function with daily tasks.    Baseline  15/80 (04/12/2017); LEFS 05/29/17 30/80    Time  --    Period  --    Status  Revised    Target Date  07/10/17            Plan - 06/05/17 1219    Clinical Impression Statement  Patient continues with swelling and pain as primary limiting factors to full function. she is going to use Hurrycane and SPC as needed to assist with improving gait pattern and pain control. She is slowly progressing with goals.    Rehab Potential  Fair    Clinical Impairments Affecting Rehab Potential  (-) healing time, pain; (+) husband support, acute condition, motivated    PT Frequency  2x / week    PT Duration  6 weeks    PT Treatment/Interventions  Neuromuscular re-education;Therapeutic exercise;Therapeutic activities;Manual  techniques;Aquatic Therapy;Electrical Stimulation;Iontophoresis 35m/ml Dexamethasone;Gait training;Stair training;Balance training;Functional mobility training;Patient/family education;Scar mobilization;Passive range of motion;Dry needling    PT Next Visit Plan  ROM, gait, function, manual techniques, modalities PRN    PT Home Exercise Plan  ROM, standing hip abduction/extension; quad sets seated, knee flexion with resistive band; elevation/ use of ice for swelling/pain       Patient will benefit from skilled therapeutic intervention in order to improve the following deficits and impairments:  Pain, Difficulty walking, Decreased strength, Decreased scar mobility, Decreased range of motion  Visit Diagnosis: Acute pain of right knee  Muscle weakness (generalized)  Stiffness of right knee, not elsewhere classified  Difficulty in walking, not elsewhere classified     Problem List Patient Active Problem List   Diagnosis Date Noted  . S/P TKR (total knee replacement) using cement, right 03/21/2017  . Primary osteoarthritis of right knee 11/02/2016  . Nausea without vomiting 09/01/2016  . Chronic diarrhea 12/07/2014  . Segmental colitis with rectal bleeding ([place] 09/12/2014  . Routine general medical examination at a health care facility 06/05/2014  . Overweight 06/13/2011  . ABNORMAL CHEST XRAY 09/30/2007  . Hyperlipidemia 02/17/2007  . Essential hypertension 01/08/2007  . Osteoarthritis 01/08/2007    BJomarie LongsPT 06/05/2017, 12:56 PM  [facility] [address] [place] [address]Phone: [phone]  Fax:  [phone] Name: Stacy CORTIMRN: [id number] of Birth: 3Mar 10, 1952

## 2017-06-06 ENCOUNTER — Telehealth (INDEPENDENT_AMBULATORY_CARE_PROVIDER_SITE_OTHER): Payer: Self-pay | Admitting: Orthopaedic Surgery

## 2017-06-06 NOTE — Telephone Encounter (Signed)
Please advise 

## 2017-06-06 NOTE — Telephone Encounter (Signed)
Patient calling to let you know she tripped last Friday, putting foot down, and leg took a hard jolt. Leg has been, and still is swollen. Physical Therapy recommended she inform you. Please call patient to advise.

## 2017-06-06 NOTE — Telephone Encounter (Signed)
Need to see if a concern-difficult to diagnose over the phone

## 2017-06-07 ENCOUNTER — Ambulatory Visit: Payer: Medicare Other | Admitting: Physical Therapy

## 2017-06-07 ENCOUNTER — Encounter: Payer: Self-pay | Admitting: Physical Therapy

## 2017-06-07 DIAGNOSIS — M25661 Stiffness of right knee, not elsewhere classified: Secondary | ICD-10-CM | POA: Diagnosis not present

## 2017-06-07 DIAGNOSIS — R262 Difficulty in walking, not elsewhere classified: Secondary | ICD-10-CM

## 2017-06-07 DIAGNOSIS — M25561 Pain in right knee: Secondary | ICD-10-CM | POA: Diagnosis not present

## 2017-06-07 DIAGNOSIS — M6281 Muscle weakness (generalized): Secondary | ICD-10-CM

## 2017-06-07 NOTE — Therapy (Signed)
[facility] [address], [place], [address] Phone: [phone]   Fax:  [phone]  Physical Therapy Treatment  Patient Details  Name: [name] MRN: [id number] Date of Birth: 1950/12/04 Referring Provider: Joni Fears, MD   Encounter Date: 06/07/2017  PT End of Session - 06/07/17 1216    Visit Number  17    Number of Visits  24    Date for PT Re-Evaluation  07/10/17    PT Start Time  6834    PT Stop Time  1232    PT Time Calculation (min)  48 min    Equipment Utilized During Treatment  -- pt hurrycane    Activity Tolerance  Patient tolerated treatment well;Patient limited by pain    Behavior During Therapy  [facility] for tasks assessed/performed       Past Medical History:  Diagnosis Date  . Arthritis    back, fingers with joint pain and swelling.  chronic back pain  . Chronic back pain    arthritis   . Diverticulosis   . Elevated cholesterol    takes Niacin daily and Simvastatin  . GERD (gastroesophageal reflux disease) 06/2004   non-specific gastritis on EGD 06/2004  . Headache(784.0)    occasionally  . History of colon polyps 2004, 2009   2004:adenomatous. 2009 hyperplastic.   Marland Kitchen History of hiatal hernia   . Hypertension    takes Tenoretic and Lisinopril daily  . Mucoid cyst of joint 08/2013   right index finger  . Osteopenia 01/2014   T score -1.1 FRAX 14%/0.5%. Stable from prior DEXA  . PONV (postoperative nausea and vomiting)   . Seasonal allergies   . Stroke ([place]) 1998   x 2 - mild left-sided weakness  . Urge incontinence   . Uterine prolapse     Past Surgical History:  Procedure Laterality Date  . BACK SURGERY     x 2  . COLONOSCOPY  2004, 2009, 2014  . FINGER SURGERY    . GUM SURGERY    . GYNECOLOGIC CRYOSURGERY    . HYSTEROSCOPY W/D&C  12/07/2010   with resection of endometrial polyp  . KNEE ARTHROSCOPY Left 2008  . lip biopsy     done at MD office Fri 11/22/13  .  MASS EXCISION Right 08/15/2013   Procedure: RIGHT INDEX EXCISION MASS ;  Surgeon: Tennis Must, MD;  Location: [place];  Service: Orthopedics;  Laterality: Right;  . NASAL SEPTUM SURGERY    . OOPHORECTOMY Right 2000  . SHOULDER ARTHROSCOPY WITH OPEN ROTATOR CUFF REPAIR AND DISTAL CLAVICLE ACROMINECTOMY Right 11/26/2013   Procedure: RIGHT SHOULDER ARTHROSCOPY WITH MINI OPEN ROTATOR CUFF REPAIR AND DISTAL CLAVICLE RESECTION, SUBACROMIAL DECOMPRESSION, POSSIBLE [facility] PATCH.;  Surgeon: [name], MD;  Location: [place];  Service: Orthopedics;  Laterality: Right;  . TOTAL KNEE ARTHROPLASTY Right 03/21/2017  . TOTAL KNEE ARTHROPLASTY Right 03/21/2017   Procedure: RIGHT TOTAL KNEE ARTHROPLASTY;  Surgeon: [name], MD;  Location: [place];  Service: Orthopedics;  Laterality: Right;  . TUBAL LIGATION      There were no vitals filed for this visit.  Subjective Assessment - 06/07/17 1157    Subjective  Patient reports sore and stiff in right knee     Pertinent History  S/P R TKA on 03/21/2017. Pt had osteoarthritis and it was "bone on bone." Had 5 home health PT visits, and a little bit of PT at the hospital.  R knee hurts currently and feels tight.  PLOF: pt was using a SPC at times when her knee was acting up real bad but did not have to use her SPC all the time.      Limitations  Walking;Standing;House hold activities;Sitting    Patient Stated Goals  I just want to be able to get back to normal. (Has 5 dogs she has to take care of, do chores around the house, be active).     Currently in Pain?  Yes    Pain Score  2     Pain Location  Knee    Pain Orientation  Right    Pain Descriptors / Indicators  Tightness;Aching    Pain Type  Surgical pain;Acute pain 03/21/17    Pain Onset  More than a month ago    Pain Frequency  Intermittent       Objective: Gait: antalgic Palpation:moderate swellingnoted right knee medial aspect; decreased soft tissue elasticity medial  quadriceps AAROM: right knee flexion pre treatment 0-95; post treatment up to 105 degrees  Treatment:  Manual therapy:66mn.: goal improve soft tissue elasticity, improve ROM right knee; improve gait pattern STM performed to rightkneescar mobilization and quadriceps with patient seated at edge of treatment table with LE's supported and knee flexed  Therapeutic exercise: patient performed with demonstration, instruction, verbal and tactile cues of therapist:goal: independent with home program, improve LEFS, ambulation without AD  Sitting: Knee flexion isometricend range x 10 reps Leg press 25# x 25 bilateral LE's, 35# x 15 reps with assist of therapist  NuStep x188m with assistance to get on with good hip alignment and position; level #4  Modalities: Electrical stimulation: 15 min: Russian stim. 10/10 cycle applied (2) electrodes to quadriceps/VMO LE with patient reclined with LE supported on pillow; pt. Performing quad sets/knee extension (SAQ) with each cycle; goal muscle re education  Patient response to treatment:improved soft tissue elasticity and able to perform exercise with less difficulty with minimal cuing and assistance of therapist. improved quadriceps control with ambulation and SAQ following estim.    PT Education - 06/07/17 1215    Education provided  Yes    Education Details  exercise instruction for leg press, SAQ with estim.    Person(s) Educated  Patient    Methods  Explanation;Demonstration;Verbal cues    Comprehension  Verbalized understanding;Returned demonstration;Verbal cues required          PT Long Term Goals - 05/29/17 1648      PT LONG TERM GOAL #1   Title  Patient will be able to ambulate at least 500 ft without use of AD and no LOB to promote mobility.     Baseline  Pt currently ambulates with rw (04/12/2017); patient ambulating with SPC with pain right and left LE knees and is using rolling walker as needed (05/29/17)    Status   Revised    Target Date  06/26/17      PT LONG TERM GOAL #2   Title  Pt will improve seated R knee extension AROM to -8 degrees or less to promote ability to ambulate, perform standing tasks.     Baseline  seated R knee extension AROM -25 degrees (04/12/2017)    Time  --    Period  --    Status  Achieved      PT LONG TERM GOAL #3   Title  Pt will improve seated R knee flexion AROM to at least 115 degrees to promote ability to ambulate, negiotiate  steps, perform transfers with less difficulty.     Baseline  seated R knee flexion AROM 77 degrees (04/12/2017); AAROM right knee flexion 0-  up to 100 degrees with pain limiting further motion (05/29/17)    Time  --    Period  --    Status  Revised    Target Date  07/10/17      PT LONG TERM GOAL #4   Title  Pt will improve R knee flexion strength to at least 4+/5 and R knee extension strength to at least 5/5 to promote ability to ambulate, perform chores.     Baseline  right knee flexion 4-/5, knee extension improving 4-/5: (05/29/17)    Time  --    Period  --    Status  Revised    Target Date  07/10/17      PT LONG TERM GOAL #5   Title  Patient will improve her LEFS score to 50 or more points as a demonstration of improved function with daily tasks.    Baseline  15/80 (04/12/2017); LEFS 05/29/17 30/80    Time  --    Period  --    Status  Revised    Target Date  07/10/17            Plan - 06/07/17 1216    Clinical Impression Statement  Patient demonstrates slow, steady progress with goals with improvement noted in ROM up to 105 degrees flexion with less difficulty and pain. She will benefit from conitnued physical therapy intervention to address limitations and achieve goals.     Rehab Potential  Fair    Clinical Impairments Affecting Rehab Potential  (-) healing time, pain; (+) husband support, acute condition, motivated    PT Frequency  2x / week    PT Duration  6 weeks    PT Treatment/Interventions  Neuromuscular  re-education;Therapeutic exercise;Therapeutic activities;Manual techniques;Aquatic Therapy;Electrical Stimulation;Iontophoresis 51m/ml Dexamethasone;Gait training;Stair training;Balance training;Functional mobility training;Patient/family education;Scar mobilization;Passive range of motion;Dry needling    PT Next Visit Plan  ROM, gait, function, manual techniques, modalities PRN    PT Home Exercise Plan  ROM, standing hip abduction/extension; quad sets seated, knee flexion with resistive band; elevation/ use of ice for swelling/pain       Patient will benefit from skilled therapeutic intervention in order to improve the following deficits and impairments:  Pain, Difficulty walking, Decreased strength, Decreased scar mobility, Decreased range of motion  Visit Diagnosis: Acute pain of right knee  Muscle weakness (generalized)  Stiffness of right knee, not elsewhere classified  Difficulty in walking, not elsewhere classified     Problem List Patient Active Problem List   Diagnosis Date Noted  . S/P TKR (total knee replacement) using cement, right 03/21/2017  . Primary osteoarthritis of right knee 11/02/2016  . Nausea without vomiting 09/01/2016  . Chronic diarrhea 12/07/2014  . Segmental colitis with rectal bleeding ([place] 09/12/2014  . Routine general medical examination at a health care facility 06/05/2014  . Overweight 06/13/2011  . ABNORMAL CHEST XRAY 09/30/2007  . Hyperlipidemia 02/17/2007  . Essential hypertension 01/08/2007  . Osteoarthritis 01/08/2007    BJomarie LongsPT 06/07/2017, 12:34 PM  [facility] [address] [place] [address]Phone: [phone]  Fax:  [phone] Name: Stacy KOSSMANMRN: [id number] of Birth: [birth year]

## 2017-06-07 NOTE — Telephone Encounter (Signed)
Called pt and she said she thought everything felt fine. She did not fall to ground. She relates she is discouraged about her recovery time.

## 2017-06-12 ENCOUNTER — Encounter: Payer: Self-pay | Admitting: Physical Therapy

## 2017-06-12 ENCOUNTER — Ambulatory Visit: Payer: Medicare Other | Attending: Orthopaedic Surgery | Admitting: Physical Therapy

## 2017-06-12 DIAGNOSIS — M25661 Stiffness of right knee, not elsewhere classified: Secondary | ICD-10-CM | POA: Diagnosis not present

## 2017-06-12 DIAGNOSIS — M6281 Muscle weakness (generalized): Secondary | ICD-10-CM | POA: Diagnosis not present

## 2017-06-12 DIAGNOSIS — R262 Difficulty in walking, not elsewhere classified: Secondary | ICD-10-CM | POA: Insufficient documentation

## 2017-06-12 DIAGNOSIS — M25561 Pain in right knee: Secondary | ICD-10-CM | POA: Insufficient documentation

## 2017-06-12 NOTE — Therapy (Signed)
[facility] [address] [place], [address] Phone: [phone]   Fax:  [phone]  Physical Therapy Treatment  Patient Details  Name: [name] MRN: [id number] Date of Birth: 1950/11/15 Referring Provider: Joni Fears, MD   Encounter Date: 06/12/2017  PT End of Session - 06/12/17 1144    Visit Number  18    Number of Visits  24    Date for PT Re-Evaluation  07/10/17    PT Start Time  1142    PT Stop Time  1230    PT Time Calculation (min)  48 min    Equipment Utilized During Treatment  -- pt hurrycane   Activity Tolerance  Patient tolerated treatment well;Patient limited by pain    Behavior During Therapy  [facility] for tasks assessed/performed       Past Medical History:  Diagnosis Date  . Arthritis    back, fingers with joint pain and swelling.  chronic back pain  . Chronic back pain    arthritis   . Diverticulosis   . Elevated cholesterol    takes Niacin daily and Simvastatin  . GERD (gastroesophageal reflux disease) 06/2004   non-specific gastritis on EGD 06/2004  . Headache(784.0)    occasionally  . History of colon polyps 2004, 2009   2004:adenomatous. 2009 hyperplastic.   Marland Kitchen History of hiatal hernia   . Hypertension    takes Tenoretic and Lisinopril daily  . Mucoid cyst of joint 08/2013   right index finger  . Osteopenia 01/2014   T score -1.1 FRAX 14%/0.5%. Stable from prior DEXA  . PONV (postoperative nausea and vomiting)   . Seasonal allergies   . Stroke ([place]) 1998   x 2 - mild left-sided weakness  . Urge incontinence   . Uterine prolapse     Past Surgical History:  Procedure Laterality Date  . BACK SURGERY     x 2  . COLONOSCOPY  2004, 2009, 2014  . FINGER SURGERY    . GUM SURGERY    . GYNECOLOGIC CRYOSURGERY    . HYSTEROSCOPY W/D&C  12/07/2010   with resection of endometrial polyp  . KNEE ARTHROSCOPY Left 2008  . lip biopsy     done at MD office Fri 11/22/13  .  MASS EXCISION Right 08/15/2013   Procedure: RIGHT INDEX EXCISION MASS ;  Surgeon: Tennis Must, MD;  Location: [employer];  Service: Orthopedics;  Laterality: Right;  . NASAL SEPTUM SURGERY    . OOPHORECTOMY Right 2000  . SHOULDER ARTHROSCOPY WITH OPEN ROTATOR CUFF REPAIR AND DISTAL CLAVICLE ACROMINECTOMY Right 11/26/2013   Procedure: RIGHT SHOULDER ARTHROSCOPY WITH MINI OPEN ROTATOR CUFF REPAIR AND DISTAL CLAVICLE RESECTION, SUBACROMIAL DECOMPRESSION, POSSIBLE [facility] PATCH.;  Surgeon: [name], MD;  Location: [place];  Service: Orthopedics;  Laterality: Right;  . TOTAL KNEE ARTHROPLASTY Right 03/21/2017  . TOTAL KNEE ARTHROPLASTY Right 03/21/2017   Procedure: RIGHT TOTAL KNEE ARTHROPLASTY;  Surgeon: [name], MD;  Location: [place];  Service: Orthopedics;  Laterality: Right;  . TUBAL LIGATION      There were no vitals filed for this visit.  Subjective Assessment - 06/12/17 1143    Subjective  Patient reports she is running late due to not realizing time of appointment. She continues with pain and stiffness as primary concerns right knee    Pertinent History  S/P R TKA on 03/21/2017. Pt had osteoarthritis and it was "bone on bone." Had  5 home health PT visits, and a little bit of PT at the hospital. R knee hurts currently and feels tight.  PLOF: pt was using a SPC at times when her knee was acting up real bad but did not have to use her SPC all the time.      Limitations  Walking;Standing;House hold activities;Sitting    Patient Stated Goals  I just want to be able to get back to normal. (Has 5 dogs she has to take care of, do chores around the house, be active).     Currently in Pain?  Yes    Pain Score  3     Pain Location  Knee    Pain Orientation  Right    Pain Descriptors / Indicators  Aching;Tightness    Pain Type  Surgical pain;Acute pain 03/21/17    Pain Onset  More than a month ago    Pain Frequency  Intermittent         Objective: Gait: slow  cadence, independent with hurrycane Palpation:moderateswellingnoted right knee medial aspect; decreased soft tissue elasticity quadriceps just proximal to incision and patella AAROM: right knee flexion pre treatment 0-95; post treatment up to 100 degrees  Treatment:  Manual therapy:35mn.: goal improve soft tissue elasticity, improve ROM right knee; improve gait pattern STM performed to rightkneescar mobilization and quadriceps with patient seated at edge of treatment table with LE's supported and knee flexed  Therapeutic exercise: patient performed with demonstration, instruction, verbal and tactile cues of therapist:goal: independent with home program, improve LEFS, ambulation without AD  Sitting: Knee flexion isometricend range x 10 reps; instructed in using green resistive band for home exercise  Leg press 25# x 25 bilateral LE's, 35# x 15reps with assist of therapist  NuStep x145m with assistance to get on with good hip alignment and position; level #4  Modalities: Electrical stimulation: 15 min: Russian stim. 10/10 cycle applied (2) electrodes to quadriceps/VMO LE with patient reclined with LE supported on pillow; pt. Performing quad sets/knee extension (SAQ) with each cycle; goal muscle re education  Patient response to treatment:improved soft tissue elasticity with improved knee flexion to 100 degrees following STM and patella mobilization. Improved quadriceps control following estim.     PT Education - 06/12/17 1200    Education provided  Yes    Education Details  exercise instruction for technique and added leg press with green resistive band for home   Person(s) Educated  Patient    Methods  Explanation;Verbal cues    Comprehension  Verbalized understanding;Verbal cues required          PT Long Term Goals - 05/29/17 1648      PT LONG TERM GOAL #1   Title  Patient will be able to ambulate at least 500 ft without use of AD and no LOB to promote  mobility.     Baseline  Pt currently ambulates with rw (04/12/2017); patient ambulating with SPC with pain right and left LE knees and is using rolling walker as needed (05/29/17)    Status  Revised    Target Date  06/26/17      PT LONG TERM GOAL #2   Title  Pt will improve seated R knee extension AROM to -8 degrees or less to promote ability to ambulate, perform standing tasks.     Baseline  seated R knee extension AROM -25 degrees (04/12/2017)    Time  --    Period  --    Status  Achieved  PT LONG TERM GOAL #3   Title  Pt will improve seated R knee flexion AROM to at least 115 degrees to promote ability to ambulate, negiotiate steps, perform transfers with less difficulty.     Baseline  seated R knee flexion AROM 77 degrees (04/12/2017); AAROM right knee flexion 0-  up to 100 degrees with pain limiting further motion (05/29/17)    Time  --    Period  --    Status  Revised    Target Date  07/10/17      PT LONG TERM GOAL #4   Title  Pt will improve R knee flexion strength to at least 4+/5 and R knee extension strength to at least 5/5 to promote ability to ambulate, perform chores.     Baseline  right knee flexion 4-/5, knee extension improving 4-/5: (05/29/17)    Time  --    Period  --    Status  Revised    Target Date  07/10/17      PT LONG TERM GOAL #5   Title  Patient will improve her LEFS score to 50 or more points as a demonstration of improved function with daily tasks.    Baseline  15/80 (04/12/2017); LEFS 05/29/17 30/80    Time  --    Period  --    Status  Revised    Target Date  07/10/17            Plan - 06/12/17 1945    Clinical Impression Statement  Patient continues to progress slowly towards goals due to pain and stiffness in right knee s/p TKA. She has 100 degrees flexion today with pain and swelling limiting further motion. she will benefit from further physical therapy intervention to achieve goals.     Rehab Potential  Fair    Clinical Impairments Affecting  Rehab Potential  (-) healing time, pain; (+) husband support, acute condition, motivated    PT Frequency  2x / week    PT Duration  6 weeks    PT Treatment/Interventions  Neuromuscular re-education;Therapeutic exercise;Therapeutic activities;Manual techniques;Aquatic Therapy;Electrical Stimulation;Iontophoresis 71m/ml Dexamethasone;Gait training;Stair training;Balance training;Functional mobility training;Patient/family education;Scar mobilization;Passive range of motion;Dry needling    PT Next Visit Plan  ROM, gait, function, manual techniques, modalities PRN    PT Home Exercise Plan  ROM, standing hip abduction/extension; quad sets seated, knee flexion with resistive band; elevation/ use of ice for swelling/pain       Patient will benefit from skilled therapeutic intervention in order to improve the following deficits and impairments:  Pain, Difficulty walking, Decreased strength, Decreased scar mobility, Decreased range of motion  Visit Diagnosis: Acute pain of right knee  Muscle weakness (generalized)  Stiffness of right knee, not elsewhere classified  Difficulty in walking, not elsewhere classified     Problem List Patient Active Problem List   Diagnosis Date Noted  . S/P TKR (total knee replacement) using cement, right 03/21/2017  . Primary osteoarthritis of right knee 11/02/2016  . Nausea without vomiting 09/01/2016  . Chronic diarrhea 12/07/2014  . Segmental colitis with rectal bleeding ([place] 09/12/2014  . Routine general medical examination at a health care facility 06/05/2014  . Overweight 06/13/2011  . ABNORMAL CHEST XRAY 09/30/2007  . Hyperlipidemia 02/17/2007  . Essential hypertension 01/08/2007  . Osteoarthritis 01/08/2007    BJomarie LongsPT 06/12/2017, 7:51 PM  [facility] [address] [place] [address]Phone: [phone]  Fax:  [phone]  Name: [name] MRN:  [id number] Date of Birth: 22-Sep-1950

## 2017-06-14 ENCOUNTER — Encounter: Payer: Self-pay | Admitting: Physical Therapy

## 2017-06-14 ENCOUNTER — Ambulatory Visit: Payer: Medicare Other | Admitting: Physical Therapy

## 2017-06-14 DIAGNOSIS — M6281 Muscle weakness (generalized): Secondary | ICD-10-CM | POA: Diagnosis not present

## 2017-06-14 DIAGNOSIS — M25661 Stiffness of right knee, not elsewhere classified: Secondary | ICD-10-CM | POA: Diagnosis not present

## 2017-06-14 DIAGNOSIS — M25561 Pain in right knee: Secondary | ICD-10-CM

## 2017-06-14 DIAGNOSIS — R262 Difficulty in walking, not elsewhere classified: Secondary | ICD-10-CM

## 2017-06-14 NOTE — Therapy (Signed)
[facility] [address] [place], [address] Phone: [phone]   Fax:  [phone]  Physical Therapy Treatment  Patient Details  Name: [name] MRN: [id number] Date of Birth: 09-27-1950 Referring Provider: Joni Fears, MD   Encounter Date: 06/14/2017  PT End of Session - 06/14/17 1110    Visit Number  19    Number of Visits  24    Date for PT Re-Evaluation  07/10/17    PT Start Time  1105    PT Stop Time  1150    PT Time Calculation (min)  45 min    Equipment Utilized During Treatment  Other (comment) pt hurry cane    Activity Tolerance  Patient tolerated treatment well    Behavior During Therapy  [facility] for tasks assessed/performed       Past Medical History:  Diagnosis Date  . Arthritis    back, fingers with joint pain and swelling.  chronic back pain  . Chronic back pain    arthritis   . Diverticulosis   . Elevated cholesterol    takes Niacin daily and Simvastatin  . GERD (gastroesophageal reflux disease) 06/2004   non-specific gastritis on EGD 06/2004  . Headache(784.0)    occasionally  . History of colon polyps 2004, 2009   2004:adenomatous. 2009 hyperplastic.   Marland Kitchen History of hiatal hernia   . Hypertension    takes Tenoretic and Lisinopril daily  . Mucoid cyst of joint 08/2013   right index finger  . Osteopenia 01/2014   T score -1.1 FRAX 14%/0.5%. Stable from prior DEXA  . PONV (postoperative nausea and vomiting)   . Seasonal allergies   . Stroke ([place]) 1998   x 2 - mild left-sided weakness  . Urge incontinence   . Uterine prolapse     Past Surgical History:  Procedure Laterality Date  . BACK SURGERY     x 2  . COLONOSCOPY  2004, 2009, 2014  . FINGER SURGERY    . GUM SURGERY    . GYNECOLOGIC CRYOSURGERY    . HYSTEROSCOPY W/D&C  12/07/2010   with resection of endometrial polyp  . KNEE ARTHROSCOPY Left 2008  . lip biopsy     done at MD office Fri 11/22/13  . MASS  EXCISION Right 08/15/2013   Procedure: RIGHT INDEX EXCISION MASS ;  Surgeon: Tennis Must, MD;  Location: [place];  Service: Orthopedics;  Laterality: Right;  . NASAL SEPTUM SURGERY    . OOPHORECTOMY Right 2000  . SHOULDER ARTHROSCOPY WITH OPEN ROTATOR CUFF REPAIR AND DISTAL CLAVICLE ACROMINECTOMY Right 11/26/2013   Procedure: RIGHT SHOULDER ARTHROSCOPY WITH MINI OPEN ROTATOR CUFF REPAIR AND DISTAL CLAVICLE RESECTION, SUBACROMIAL DECOMPRESSION, POSSIBLE [facility] PATCH.;  Surgeon: [name], MD;  Location: [facility];  Service: Orthopedics;  Laterality: Right;  . TOTAL KNEE ARTHROPLASTY Right 03/21/2017  . TOTAL KNEE ARTHROPLASTY Right 03/21/2017   Procedure: RIGHT TOTAL KNEE ARTHROPLASTY;  Surgeon: [name], MD;  Location: [place];  Service: Orthopedics;  Laterality: Right;  . TUBAL LIGATION      There were no vitals filed for this visit.  Subjective Assessment - 06/14/17 1109    Subjective  Patient reports she was hurting more yesterday and is better today. c/c is stiffness    Pertinent History  S/P R TKA on 03/21/2017. Pt had osteoarthritis and it was "bone on bone." Had 5 home health PT visits, and a little bit of  PT at the hospital. R knee hurts currently and feels tight.  PLOF: pt was using a SPC at times when her knee was acting up real bad but did not have to use her SPC all the time.      Limitations  Walking;Standing;House hold activities;Sitting    Patient Stated Goals  I just want to be able to get back to normal. (Has 5 dogs she has to take care of, do chores around the house, be active).     Currently in Pain?  Yes    Pain Score  3     Pain Location  Knee    Pain Orientation  Right    Pain Descriptors / Indicators  Aching;Tightness    Pain Type  Surgical pain;Acute pain 03/21/17    Pain Onset  More than a month ago    Pain Frequency  Intermittent       Objective: AAROM right knee flexion pre treatment 0-95 degrees Palpation: mild warmth and  tenderness anterior aspect right knee  Treatment:  Manual therapy:63mn.: goal improve soft tissue elasticity, improve ROM right knee; improve gait pattern STM performed to rightkneescar mobilization andquadricepswith patient seated at edge of treatment table with LE's supported and knee flexed  Therapeutic exercise: patient performed with demonstration, instruction, verbal and tactile cues of therapist:goal: independent with home program, improve LEFS, ambulation without AD  Sitting: Knee flexion isometricend range x 10 reps; instructed in using green resistive band for home exercise  Leg press 25# x15bilateral LE's, 35# x 15reps. 20# x 25 reps Standing side stepping on balance beam x 2 min. With controlled movement and short steps Step ups onto balance pad x 10 reps each LE Sway side to side and forward and back on balance pad x 2 minutes each Hip  Rotary machine 40# hip extension each LE x 15 reps with demonstration and moderate cuing for correct technique  NuStep x186m with assistance to get on with good hip alignment and position; level #4  Patient response to treatment:improved ROM and ability to perform exercises with moderate cuing and repeated demonstration and verbal cues of therapist. AAROM right knee improved 5 degrees and pain increased slightly with exercises and returned to 3/10 at end of session.     PT Long Term Goals - 05/29/17 1648      PT LONG TERM GOAL #1   Title  Patient will be able to ambulate at least 500 ft without use of AD and no LOB to promote mobility.     Baseline  Pt currently ambulates with rw (04/12/2017); patient ambulating with SPC with pain right and left LE knees and is using rolling walker as needed (05/29/17)    Status  Revised    Target Date  06/26/17      PT LONG TERM GOAL #2   Title  Pt will improve seated R knee extension AROM to -8 degrees or less to promote ability to ambulate, perform standing tasks.     Baseline  seated  R knee extension AROM -25 degrees (04/12/2017)    Time  --    Period  --    Status  Achieved      PT LONG TERM GOAL #3   Title  Pt will improve seated R knee flexion AROM to at least 115 degrees to promote ability to ambulate, negiotiate steps, perform transfers with less difficulty.     Baseline  seated R knee flexion AROM 77 degrees (04/12/2017); AAROM right knee flexion 0-  up to 100 degrees with pain limiting further motion (05/29/17)    Time  --    Period  --    Status  Revised    Target Date  07/10/17      PT LONG TERM GOAL #4   Title  Pt will improve R knee flexion strength to at least 4+/5 and R knee extension strength to at least 5/5 to promote ability to ambulate, perform chores.     Baseline  right knee flexion 4-/5, knee extension improving 4-/5: (05/29/17)    Time  --    Period  --    Status  Revised    Target Date  07/10/17      PT LONG TERM GOAL #5   Title  Patient will improve her LEFS score to 50 or more points as a demonstration of improved function with daily tasks.    Baseline  15/80 (04/12/2017); LEFS 05/29/17 30/80    Time  --    Period  --    Status  Revised    Target Date  07/10/17            Plan - 06/14/17 1141    Clinical Impression Statement  Patient continues to progress towards goals with improvement in ROM and strength right LE s/p TKA. She will benefit from continued physical therapy intervention to achieve goals.     Rehab Potential  Fair    Clinical Impairments Affecting Rehab Potential  (-) healing time, pain; (+) husband support, acute condition, motivated    PT Frequency  2x / week    PT Duration  6 weeks    PT Treatment/Interventions  Neuromuscular re-education;Therapeutic exercise;Therapeutic activities;Manual techniques;Aquatic Therapy;Electrical Stimulation;Iontophoresis 2m/ml Dexamethasone;Gait training;Stair training;Balance training;Functional mobility training;Patient/family education;Scar mobilization;Passive range of motion;Dry needling     PT Next Visit Plan  ROM, gait, function, manual techniques, modalities PRN    PT Home Exercise Plan  ROM, standing hip abduction/extension; quad sets seated, knee flexion with resistive band; elevation/ use of ice for swelling/pain       Patient will benefit from skilled therapeutic intervention in order to improve the following deficits and impairments:  Pain, Difficulty walking, Decreased strength, Decreased scar mobility, Decreased range of motion  Visit Diagnosis: Acute pain of right knee  Muscle weakness (generalized)  Stiffness of right knee, not elsewhere classified  Difficulty in walking, not elsewhere classified     Problem List Patient Active Problem List   Diagnosis Date Noted  . S/P TKR (total knee replacement) using cement, right 03/21/2017  . Primary osteoarthritis of right knee 11/02/2016  . Nausea without vomiting 09/01/2016  . Chronic diarrhea 12/07/2014  . Segmental [place] with [place] 09/12/2014  . Routine general medical examination at a health care facility 06/05/2014  . Overweight 06/13/2011  . ABNORMAL CHEST XRAY 09/30/2007  . Hyperlipidemia 02/17/2007  . Essential hypertension 01/08/2007  . Osteoarthritis 01/08/2007    BJomarie LongsPT 06/14/2017, 2:18 PM  [facility] [address] [place] [address]Phone: [phone]  Fax:  [phone] Name: Stacy PHOMRN: [id number] of Birth: 310-15-1952

## 2017-06-19 ENCOUNTER — Encounter: Payer: Self-pay | Admitting: Physical Therapy

## 2017-06-19 ENCOUNTER — Ambulatory Visit: Payer: Medicare Other | Admitting: Physical Therapy

## 2017-06-19 DIAGNOSIS — M6281 Muscle weakness (generalized): Secondary | ICD-10-CM | POA: Diagnosis not present

## 2017-06-19 DIAGNOSIS — R262 Difficulty in walking, not elsewhere classified: Secondary | ICD-10-CM | POA: Diagnosis not present

## 2017-06-19 DIAGNOSIS — M25561 Pain in right knee: Secondary | ICD-10-CM

## 2017-06-19 DIAGNOSIS — M25661 Stiffness of right knee, not elsewhere classified: Secondary | ICD-10-CM | POA: Diagnosis not present

## 2017-06-19 NOTE — Therapy (Signed)
[facility] [address], [place], [address] Phone: [phone]   Fax:  [phone]  Physical Therapy Treatment  Patient Details  Name: ROYAL BEIRNE MRN: [id number] Date of Birth: 08/17/1950 Referring Provider: Joni Fears, MD   Encounter Date: 06/19/2017  PT End of Session - 06/19/17 1151    Visit Number  20    Number of Visits  24    Date for PT Re-Evaluation  07/10/17    PT Start Time  1101    PT Stop Time  1147    PT Time Calculation (min)  46 min    Equipment Utilized During Treatment  Other (comment) pt hurry cane    Activity Tolerance  Patient tolerated treatment well    Behavior During Therapy  [facility] for tasks assessed/performed       Past Medical History:  Diagnosis Date  . Arthritis    back, fingers with joint pain and swelling.  chronic back pain  . Chronic back pain    arthritis   . Diverticulosis   . Elevated cholesterol    takes Niacin daily and Simvastatin  . GERD (gastroesophageal reflux disease) 06/2004   non-specific gastritis on EGD 06/2004  . Headache(784.0)    occasionally  . History of colon polyps 2004, 2009   2004:adenomatous. 2009 hyperplastic.   Marland Kitchen History of hiatal hernia   . Hypertension    takes Tenoretic and Lisinopril daily  . Mucoid cyst of joint 08/2013   right index finger  . Osteopenia 01/2014   T score -1.1 FRAX 14%/0.5%. Stable from prior DEXA  . PONV (postoperative nausea and vomiting)   . Seasonal allergies   . Stroke ([place]) 1998   x 2 - mild left-sided weakness  . Urge incontinence   . Uterine prolapse     Past Surgical History:  Procedure Laterality Date  . BACK SURGERY     x 2  . COLONOSCOPY  2004, 2009, 2014  . FINGER SURGERY    . GUM SURGERY    . GYNECOLOGIC CRYOSURGERY    . HYSTEROSCOPY W/D&C  12/07/2010   with resection of endometrial polyp  . KNEE ARTHROSCOPY Left 2008  . lip biopsy     done at MD office Fri 11/22/13  . MASS  EXCISION Right 08/15/2013   Procedure: RIGHT INDEX EXCISION MASS ;  Surgeon: Tennis Must, MD;  Location: [place];  Service: Orthopedics;  Laterality: Right;  . NASAL SEPTUM SURGERY    . OOPHORECTOMY Right 2000  . SHOULDER ARTHROSCOPY WITH OPEN ROTATOR CUFF REPAIR AND DISTAL CLAVICLE ACROMINECTOMY Right 11/26/2013   Procedure: RIGHT SHOULDER ARTHROSCOPY WITH MINI OPEN ROTATOR CUFF REPAIR AND DISTAL CLAVICLE RESECTION, SUBACROMIAL DECOMPRESSION, POSSIBLE [facility] PATCH.;  Surgeon: [name], MD;  Location: [place];  Service: Orthopedics;  Laterality: Right;  . TOTAL KNEE ARTHROPLASTY Right 03/21/2017  . TOTAL KNEE ARTHROPLASTY Right 03/21/2017   Procedure: RIGHT TOTAL KNEE ARTHROPLASTY;  Surgeon: [name], MD;  Location: [place];  Service: Orthopedics;  Laterality: Right;  . TUBAL LIGATION      There were no vitals filed for this visit.  Subjective Assessment - 06/19/17 1142    Subjective  Patient reports she was up and moving quite a bit over the weekend with a wedding. today she reports primary concern is tightness 5/10    Pertinent History  S/P R TKA on 03/21/2017. Pt had osteoarthritis and it was "bone on bone." Had  5 home health PT visits, and a little bit of PT at the hospital. R knee hurts currently and feels tight.  PLOF: pt was using a SPC at times when her knee was acting up real bad but did not have to use her SPC all the time.      Limitations  Walking;Standing;House hold activities;Sitting    Patient Stated Goals  I just want to be able to get back to normal. (Has 5 dogs she has to take care of, do chores around the house, be active).     Currently in Pain?  Yes    Pain Score  5     Pain Location  Knee    Pain Descriptors / Indicators  Tightness    Pain Type  Surgical pain;Acute pain 03/21/17    Pain Onset  More than a month ago    Pain Frequency  Intermittent       Objective: AAROM right knee flexion pre treatment 0-100 degrees Palpation:  mild warmth and tenderness anterior aspect right knee, decreased from previous session Strength: decreased right knee flexion end range 3+/5  Treatment:  Manual therapy:63mn.: goal improve soft tissue elasticity, improve ROM right knee; improve gait pattern STM performed to rightkneescar mobilization andquadricepswith patient seated at edge of treatment table with LE's supported and knee flexed  Therapeutic exercise: patient performed with demonstration, instruction, verbal and tactile cues of therapist:goal: independent with home program, improve LEFS, ambulation without AD  Sitting: Knee flexion isometricend range x 10 reps Leg press 35# x25bilateral LE's, 45# x 15reps.  Step ups onto balance pad x 10 reps each LE Step up and over on balance pad x 10 reps Instructed in use of Can Do balance stones for balance exercises, walking, step ups  NuStep x135m warm up at beginning of session with assistance to get on with good hip alignment and position; level #4  Modalities: Electrical stimulation: Russian stim. 10/10 cycle applied (2) electrodes to quadriceps/VMO right LE with patient reclined with right LE supported on pillow; pt. Performing quad sets/knee extension with 3# on ankle with each cycle; goal muscle re education  Patient response to treatment:improved soft tissue elasticity and patella mobility following manual therapy techniques. Improved AAROM up to 110 degrees with pain end range.       PT Education - 06/19/17 1333    Education provided  Yes    Education Details  exercise instruction to improve right knee strength into flexion    Person(s) Educated  Patient    Methods  Explanation;Demonstration;Verbal cues    Comprehension  Verbal cues required;Returned demonstration;Verbalized understanding          PT Long Term Goals - 05/29/17 1648      PT LONG TERM GOAL #1   Title  Patient will be able to ambulate at least 500 ft without use of AD and no  LOB to promote mobility.     Baseline  Pt currently ambulates with rw (04/12/2017); patient ambulating with SPC with pain right and left LE knees and is using rolling walker as needed (05/29/17)    Status  Revised    Target Date  06/26/17      PT LONG TERM GOAL #2   Title  Pt will improve seated R knee extension AROM to -8 degrees or less to promote ability to ambulate, perform standing tasks.     Baseline  seated R knee extension AROM -25 degrees (04/12/2017)    Time  --    Period  --  Status  Achieved      PT LONG TERM GOAL #3   Title  Pt will improve seated R knee flexion AROM to at least 115 degrees to promote ability to ambulate, negiotiate steps, perform transfers with less difficulty.     Baseline  seated R knee flexion AROM 77 degrees (04/12/2017); AAROM right knee flexion 0-  up to 100 degrees with pain limiting further motion (05/29/17)    Time  --    Period  --    Status  Revised    Target Date  07/10/17      PT LONG TERM GOAL #4   Title  Pt will improve R knee flexion strength to at least 4+/5 and R knee extension strength to at least 5/5 to promote ability to ambulate, perform chores.     Baseline  right knee flexion 4-/5, knee extension improving 4-/5: (05/29/17)    Time  --    Period  --    Status  Revised    Target Date  07/10/17      PT LONG TERM GOAL #5   Title  Patient will improve her LEFS score to 50 or more points as a demonstration of improved function with daily tasks.    Baseline  15/80 (04/12/2017); LEFS 05/29/17 30/80    Time  --    Period  --    Status  Revised    Target Date  07/10/17            Plan - 06/19/17 1333    Clinical Impression Statement  Patient demonstrates improvement with right knee AAROM to 105/110 degrees and continues with decreased strength in right LE that limits function with dailyt tasks and ambulation.     Rehab Potential  Fair    Clinical Impairments Affecting Rehab Potential  (-) healing time, pain; (+) husband support, acute  condition, motivated    PT Frequency  2x / week    PT Duration  6 weeks    PT Treatment/Interventions  Neuromuscular re-education;Therapeutic exercise;Therapeutic activities;Manual techniques;Aquatic Therapy;Electrical Stimulation;Iontophoresis 61m/ml Dexamethasone;Gait training;Stair training;Balance training;Functional mobility training;Patient/family education;Scar mobilization;Passive range of motion;Dry needling    PT Next Visit Plan  ROM, gait, function, manual techniques, modalities PRN    PT Home Exercise Plan  ROM, standing hip abduction/extension; quad sets seated, knee flexion with resistive band; elevation/ use of ice for swelling/pain       Patient will benefit from skilled therapeutic intervention in order to improve the following deficits and impairments:  Pain, Difficulty walking, Decreased strength, Decreased scar mobility, Decreased range of motion  Visit Diagnosis: Acute pain of right knee  Muscle weakness (generalized)  Stiffness of right knee, not elsewhere classified  Difficulty in walking, not elsewhere classified     Problem List Patient Active Problem List   Diagnosis Date Noted  . S/P TKR (total knee replacement) using cement, right 03/21/2017  . Primary osteoarthritis of right knee 11/02/2016  . Nausea without vomiting 09/01/2016  . Chronic diarrhea 12/07/2014  . Segmental colitis with rectal bleeding ([place] 09/12/2014  . Routine general medical examination at a health care facility 06/05/2014  . Overweight 06/13/2011  . ABNORMAL CHEST XRAY 09/30/2007  . Hyperlipidemia 02/17/2007  . Essential hypertension 01/08/2007  . Osteoarthritis 01/08/2007    BJomarie LongsPT 06/19/2017, 1:35 PM  [facility] [address] [place] [address]Phone: [phone]  Fax:  [phone] Name: NTIANE SZYDLOWSKIMRN: [id number]  of Birth: 06-27-1950

## 2017-06-22 ENCOUNTER — Ambulatory Visit: Payer: Medicare Other | Admitting: Physical Therapy

## 2017-06-22 DIAGNOSIS — M25661 Stiffness of right knee, not elsewhere classified: Secondary | ICD-10-CM

## 2017-06-22 DIAGNOSIS — R262 Difficulty in walking, not elsewhere classified: Secondary | ICD-10-CM | POA: Diagnosis not present

## 2017-06-22 DIAGNOSIS — M6281 Muscle weakness (generalized): Secondary | ICD-10-CM | POA: Diagnosis not present

## 2017-06-22 DIAGNOSIS — M25561 Pain in right knee: Secondary | ICD-10-CM

## 2017-06-22 NOTE — Therapy (Signed)
[place] [facility] [address] [place], [address] Phone: [phone]   Fax:  [phone]  Physical Therapy Treatment  Patient Details  Name: [name] MRN: [id number] Date of Birth: Feb 25, 1951 Referring Provider: Joni Fears, MD   Encounter Date: 06/22/2017  PT End of Session - 06/22/17 1127    Visit Number  21    Number of Visits  24    Date for PT Re-Evaluation  07/10/17    PT Start Time  1121    PT Stop Time  1215    PT Time Calculation (min)  54 min    Equipment Utilized During Treatment  Other (comment) pt hurry cane    Activity Tolerance  Patient tolerated treatment well    Behavior During Therapy  [facility] for tasks assessed/performed       Past Medical History:  Diagnosis Date  . Arthritis    back, fingers with joint pain and swelling.  chronic back pain  . Chronic back pain    arthritis   . Diverticulosis   . Elevated cholesterol    takes Niacin daily and Simvastatin  . GERD (gastroesophageal reflux disease) 06/2004   non-specific gastritis on EGD 06/2004  . Headache(784.0)    occasionally  . History of colon polyps 2004, 2009   2004:adenomatous. 2009 hyperplastic.   Marland Kitchen History of hiatal hernia   . Hypertension    takes Tenoretic and Lisinopril daily  . Mucoid cyst of joint 08/2013   right index finger  . Osteopenia 01/2014   T score -1.1 FRAX 14%/0.5%. Stable from prior DEXA  . PONV (postoperative nausea and vomiting)   . Seasonal allergies   . Stroke ([place]) 1998   x 2 - mild left-sided weakness  . Urge incontinence   . Uterine prolapse     Past Surgical History:  Procedure Laterality Date  . BACK SURGERY     x 2  . COLONOSCOPY  2004, 2009, 2014  . FINGER SURGERY    . GUM SURGERY    . GYNECOLOGIC CRYOSURGERY    . HYSTEROSCOPY W/D&C  12/07/2010   with resection of endometrial polyp  . KNEE ARTHROSCOPY Left 2008  . lip biopsy     done at MD office Fri 11/22/13  . MASS  EXCISION Right 08/15/2013   Procedure: RIGHT INDEX EXCISION MASS ;  Surgeon: Tennis Must, MD;  Location: [place];  Service: Orthopedics;  Laterality: Right;  . NASAL SEPTUM SURGERY    . OOPHORECTOMY Right 2000  . SHOULDER ARTHROSCOPY WITH OPEN ROTATOR CUFF REPAIR AND DISTAL CLAVICLE ACROMINECTOMY Right 11/26/2013   Procedure: RIGHT SHOULDER ARTHROSCOPY WITH MINI OPEN ROTATOR CUFF REPAIR AND DISTAL CLAVICLE RESECTION, SUBACROMIAL DECOMPRESSION, POSSIBLE [facility] PATCH.;  Surgeon: [name], MD;  Location: [place];  Service: Orthopedics;  Laterality: Right;  . TOTAL KNEE ARTHROPLASTY Right 03/21/2017  . TOTAL KNEE ARTHROPLASTY Right 03/21/2017   Procedure: RIGHT TOTAL KNEE ARTHROPLASTY;  Surgeon: [name], MD;  Location: [place];  Service: Orthopedics;  Laterality: Right;  . TUBAL LIGATION      There were no vitals filed for this visit.    Objective: AAROM right knee flexion 0-102/105 degrees Palpation: mild warmth and tenderness anterior aspect right knee, decreased from previous session  Treatment:  Manual therapy:34mn.: goal improve soft tissue elasticity, improve ROM right knee; improve gait pattern STM performed to rightkneescar mobilization andquadricepswith patient seated at edge of treatment table with LE's supported  and knee flexed  Therapeutic exercise: patient performed with demonstration, instruction, verbal and tactile cues of therapist:goal: independent with home program, improve LEFS, ambulation without AD  Supine:  Hip abduction isometric end range x 10  With manual resistance 3-5 seconds SLR right x 10 with assist  Side lying: Clamshells with mild resistance 2 x 10 Hip abduction with assistance x 10 with assistance for correct alignment   Sitting: Knee flexion isometricend range x 10 reps  Standing:  Step ups onto balance beam x 15 reps each LE Lateral Step up balance beam x 15 reps Running man each LE x 10  reps  Modalities: Electrical stimulation: 13 min Russian stim. 10/10 cycle applied (2) electrodes to quadriceps/VMO right LE with patient reclined with right LE supported on pillow; goal muscle re education Ice pack applied to right knee in conjunction with estim.: goal; pain; no adverse reactions noted  Patient response to treatment:improved soft tissue elasticity quadriceps up to 50%. Required moderate assistance to perform hip abduction and SLR for control and alignment.     PT Education - 06/22/17 1200    Education provided  Yes    Education Details  exercise instruction for technique, alignment    Person(s) Educated  Patient    Methods  Explanation;Verbal cues;Tactile cues    Comprehension  Verbalized understanding;Verbal cues required          PT Long Term Goals - 05/29/17 1648      PT LONG TERM GOAL #1   Title  Patient will be able to ambulate at least 500 ft without use of AD and no LOB to promote mobility.     Baseline  Pt currently ambulates with rw (04/12/2017); patient ambulating with SPC with pain right and left LE knees and is using rolling walker as needed (05/29/17)    Status  Revised    Target Date  06/26/17      PT LONG TERM GOAL #2   Title  Pt will improve seated R knee extension AROM to -8 degrees or less to promote ability to ambulate, perform standing tasks.     Baseline  seated R knee extension AROM -25 degrees (04/12/2017)    Time  --    Period  --    Status  Achieved      PT LONG TERM GOAL #3   Title  Pt will improve seated R knee flexion AROM to at least 115 degrees to promote ability to ambulate, negiotiate steps, perform transfers with less difficulty.     Baseline  seated R knee flexion AROM 77 degrees (04/12/2017); AAROM right knee flexion 0-  up to 100 degrees with pain limiting further motion (05/29/17)    Time  --    Period  --    Status  Revised    Target Date  07/10/17      PT LONG TERM GOAL #4   Title  Pt will improve R knee flexion  strength to at least 4+/5 and R knee extension strength to at least 5/5 to promote ability to ambulate, perform chores.     Baseline  right knee flexion 4-/5, knee extension improving 4-/5: (05/29/17)    Time  --    Period  --    Status  Revised    Target Date  07/10/17      PT LONG TERM GOAL #5   Title  Patient will improve her LEFS score to 50 or more points as a demonstration of improved function with daily  tasks.    Baseline  15/80 (04/12/2017); LEFS 05/29/17 30/80    Time  --    Period  --    Status  Revised    Target Date  07/10/17            Plan - 06/22/17 1225    Clinical Impression Statement  Patient is progressing well towards goals with improvement with ROM and improving pain and soft tissue elasticity. She continues with pain, limited ROM and strength and will benefit from continued physical therapy intervention to achieve goals.    Rehab Potential  Fair    Clinical Impairments Affecting Rehab Potential  (-) healing time, pain; (+) husband support, acute condition, motivated    PT Frequency  2x / week    PT Duration  6 weeks    PT Treatment/Interventions  Neuromuscular re-education;Therapeutic exercise;Therapeutic activities;Manual techniques;Aquatic Therapy;Electrical Stimulation;Iontophoresis 92m/ml Dexamethasone;Gait training;Stair training;Balance training;Functional mobility training;Patient/family education;Scar mobilization;Passive range of motion;Dry needling    PT Next Visit Plan  ROM, gait, function, manual techniques, modalities PRN    PT Home Exercise Plan  ROM, standing hip abduction/extension; quad sets seated, knee flexion with resistive band; elevation/ use of ice for swelling/pain       Patient will benefit from skilled therapeutic intervention in order to improve the following deficits and impairments:  Pain, Difficulty walking, Decreased strength, Decreased scar mobility, Decreased range of motion  Visit Diagnosis: Acute pain of right knee  Muscle  weakness (generalized)  Stiffness of right knee, not elsewhere classified  Difficulty in walking, not elsewhere classified     Problem List Patient Active Problem List   Diagnosis Date Noted  . S/P TKR (total knee replacement) using cement, right 03/21/2017  . Primary osteoarthritis of right knee 11/02/2016  . Nausea without vomiting 09/01/2016  . Chronic diarrhea 12/07/2014  . Segmental colitis with rectal bleeding ([place] 09/12/2014  . Routine general medical examination at a health care facility 06/05/2014  . Overweight 06/13/2011  . ABNORMAL CHEST XRAY 09/30/2007  . Hyperlipidemia 02/17/2007  . Essential hypertension 01/08/2007  . Osteoarthritis 01/08/2007    BJomarie LongsPT 06/22/2017, 12:51 PM  [facility] [address] [place] [address]Phone: [phone]  Fax:  [phone] Name: Stacy CRIBBMRN: [id number] of Birth: 311/07/1950

## 2017-06-26 ENCOUNTER — Ambulatory Visit: Payer: Medicare Other | Admitting: Physical Therapy

## 2017-06-26 ENCOUNTER — Encounter: Payer: Self-pay | Admitting: Physical Therapy

## 2017-06-26 DIAGNOSIS — M25561 Pain in right knee: Secondary | ICD-10-CM

## 2017-06-26 DIAGNOSIS — R262 Difficulty in walking, not elsewhere classified: Secondary | ICD-10-CM

## 2017-06-26 DIAGNOSIS — M25661 Stiffness of right knee, not elsewhere classified: Secondary | ICD-10-CM | POA: Diagnosis not present

## 2017-06-26 DIAGNOSIS — M6281 Muscle weakness (generalized): Secondary | ICD-10-CM | POA: Diagnosis not present

## 2017-06-26 NOTE — Therapy (Signed)
[place] [facility] [address] [place], [address] Phone: [phone]   Fax:  [phone]  Physical Therapy Treatment  Patient Details  Name: [name] MRN: [id number] Date of Birth: 03/28/1951 Referring Provider: Joni Fears, MD   Encounter Date: 06/26/2017  PT End of Session - 06/26/17 1024    Visit Number  22    Number of Visits  24    Date for PT Re-Evaluation  07/10/17    PT Start Time  0933    PT Stop Time  1027    PT Time Calculation (min)  54 min    Equipment Utilized During Treatment  Other (comment) pt hurry cane    Activity Tolerance  Patient tolerated treatment well    Behavior During Therapy  [facility] for tasks assessed/performed       Past Medical History:  Diagnosis Date  . Arthritis    back, fingers with joint pain and swelling.  chronic back pain  . Chronic back pain    arthritis   . Diverticulosis   . Elevated cholesterol    takes Niacin daily and Simvastatin  . GERD (gastroesophageal reflux disease) 06/2004   non-specific gastritis on EGD 06/2004  . Headache(784.0)    occasionally  . History of colon polyps 2004, 2009   2004:adenomatous. 2009 hyperplastic.   Marland Kitchen History of hiatal hernia   . Hypertension    takes Tenoretic and Lisinopril daily  . Mucoid cyst of joint 08/2013   right index finger  . Osteopenia 01/2014   T score -1.1 FRAX 14%/0.5%. Stable from prior DEXA  . PONV (postoperative nausea and vomiting)   . Seasonal allergies   . Stroke ([place]) 1998   x 2 - mild left-sided weakness  . Urge incontinence   . Uterine prolapse     Past Surgical History:  Procedure Laterality Date  . BACK SURGERY     x 2  . COLONOSCOPY  2004, 2009, 2014  . FINGER SURGERY    . GUM SURGERY    . GYNECOLOGIC CRYOSURGERY    . HYSTEROSCOPY W/D&C  12/07/2010   with resection of endometrial polyp  . KNEE ARTHROSCOPY Left 2008  . lip biopsy     done at MD office Fri 11/22/13  . MASS  EXCISION Right 08/15/2013   Procedure: RIGHT INDEX EXCISION MASS ;  Surgeon: Tennis Must, MD;  Location: [place];  Service: Orthopedics;  Laterality: Right;  . NASAL SEPTUM SURGERY    . OOPHORECTOMY Right 2000  . SHOULDER ARTHROSCOPY WITH OPEN ROTATOR CUFF REPAIR AND DISTAL CLAVICLE ACROMINECTOMY Right 11/26/2013   Procedure: RIGHT SHOULDER ARTHROSCOPY WITH MINI OPEN ROTATOR CUFF REPAIR AND DISTAL CLAVICLE RESECTION, SUBACROMIAL DECOMPRESSION, POSSIBLE [facility] PATCH.;  Surgeon: [name], MD;  Location: [place];  Service: Orthopedics;  Laterality: Right;  . TOTAL KNEE ARTHROPLASTY Right 03/21/2017  . TOTAL KNEE ARTHROPLASTY Right 03/21/2017   Procedure: RIGHT TOTAL KNEE ARTHROPLASTY;  Surgeon: [name], MD;  Location: [place];  Service: Orthopedics;  Laterality: Right;  . TUBAL LIGATION      There were no vitals filed for this visit.  Subjective Assessment - 06/26/17 1011    Subjective  Sore from exercising a lot at home    Pertinent History  S/P R TKA on 03/21/2017. Pt had osteoarthritis and it was "bone on bone." Had 5 home health PT visits, and a little bit of PT at the hospital. R knee hurts  currently and feels tight.  PLOF: pt was using a SPC at times when her knee was acting up real bad but did not have to use her SPC all the time.      Limitations  Walking;Standing;House hold activities;Sitting    Patient Stated Goals  I just want to be able to get back to normal. (Has 5 dogs she has to take care of, do chores around the house, be active).     Currently in Pain?  Yes    Pain Score  3     Pain Location  Knee    Pain Orientation  Right    Pain Descriptors / Indicators  Tightness;Aching    Pain Type  Surgical pain 03/21/2017    Pain Onset  More than a month ago    Pain Frequency  Intermittent         Objective: AAROM right knee flexion 0-107 degrees Palpation: mild warmth and tenderness anterior aspect right knee, decreased from previous session    Treatment:  Manual therapy: 8 min.: goal improve soft tissue elasticity, improve ROM right knee; improve gait pattern patella mobilization with patella mobilizer 3 sets focus on distraction and inferior glides   Therapeutic exercise: patient performed with demonstration, instruction, verbal and tactile cues of therapist: goal: independent with home program, improve LEFS, ambulation without AD    Sitting: Knee flexion isometric end range x 10 reps   Standing:  Step ups onto balance stones x 10 reps each LE Lateral Step up balance stones x 15 reps step up and over (2) balance stones x 10 reps  leg press: bilateral 35# 2 x 15-25 reps blateral 45# x 15 reps  NuStep level #4 indpendent at end of session x 10 min.( keep level #4 due to increased pain medial aspect right knee at level #5)   Modalities: Electrical stimulation: 15 min Russian stim. 10/10 cycle applied (2) electrodes to quadriceps/VMO right LE, intensity to conraction/tolerance(1m) and (2) electrodes applied to medial/lateral aspect right knee high volt estim, intensity to tolerance (230 volts) with patient reclined with right LE supported on pillow; goal muscle re education/pain/spasms Ice pack applied to right knee in conjunction with estim.: goal; pain; no adverse reactions noted   Patient response to treatment: improved patella mobility, improved flexibility in right knee up to 110 degrees flexion with assistance.       PT Education - 06/26/17 1011    Education provided  Yes    Education Details  exercise instruction for technique    Person(s) Educated  Patient    Methods  Explanation;Verbal cues;Demonstration    Comprehension  Verbalized understanding;Returned demonstration;Verbal cues required          PT Long Term Goals - 05/29/17 1648      PT LONG TERM GOAL #1   Title  Patient will be able to ambulate at least 500 ft without use of AD and no LOB to promote mobility.     Baseline  Pt currently ambulates  with rw (04/12/2017); patient ambulating with SPC with pain right and left LE knees and is using rolling walker as needed (05/29/17)    Status  Revised    Target Date  06/26/17      PT LONG TERM GOAL #2   Title  Pt will improve seated R knee extension AROM to -8 degrees or less to promote ability to ambulate, perform standing tasks.     Baseline  seated R knee extension AROM -25 degrees (04/12/2017)  Time  --    Period  --    Status  Achieved      PT LONG TERM GOAL #3   Title  Pt will improve seated R knee flexion AROM to at least 115 degrees to promote ability to ambulate, negiotiate steps, perform transfers with less difficulty.     Baseline  seated R knee flexion AROM 77 degrees (04/12/2017); AAROM right knee flexion 0-  up to 100 degrees with pain limiting further motion (05/29/17)    Time  --    Period  --    Status  Revised    Target Date  07/10/17      PT LONG TERM GOAL #4   Title  Pt will improve R knee flexion strength to at least 4+/5 and R knee extension strength to at least 5/5 to promote ability to ambulate, perform chores.     Baseline  right knee flexion 4-/5, knee extension improving 4-/5: (05/29/17)    Time  --    Period  --    Status  Revised    Target Date  07/10/17      PT LONG TERM GOAL #5   Title  Patient will improve her LEFS score to 50 or more points as a demonstration of improved function with daily tasks.    Baseline  15/80 (04/12/2017); LEFS 05/29/17 30/80    Time  --    Period  --    Status  Revised    Target Date  07/10/17            Plan - 06/26/17 1200    Clinical Impression Statement  Patient continues progressing steadily towards goals with improving ROM and strength. She continues with difficulty with daily tasks and ambulation and will beneifit from additional physical therapy interveniton to achieve goals.    Rehab Potential  Fair    Clinical Impairments Affecting Rehab Potential  (-) healing time, pain; (+) husband support, acute condition,  motivated    PT Frequency  2x / week    PT Duration  6 weeks    PT Treatment/Interventions  Neuromuscular re-education;Therapeutic exercise;Therapeutic activities;Manual techniques;Aquatic Therapy;Electrical Stimulation;Iontophoresis 27m/ml Dexamethasone;Gait training;Stair training;Balance training;Functional mobility training;Patient/family education;Scar mobilization;Passive range of motion;Dry needling    PT Next Visit Plan  ROM, gait, function, manual techniques, modalities PRN    PT Home Exercise Plan  ROM, standing hip abduction/extension; quad sets seated, knee flexion with resistive band; elevation/ use of ice for swelling/pain       Patient will benefit from skilled therapeutic intervention in order to improve the following deficits and impairments:  Pain, Difficulty walking, Decreased strength, Decreased scar mobility, Decreased range of motion  Visit Diagnosis: Acute pain of right knee  Muscle weakness (generalized)  Stiffness of right knee, not elsewhere classified  Difficulty in walking, not elsewhere classified     Problem List Patient Active Problem List   Diagnosis Date Noted  . S/P TKR (total knee replacement) using cement, right 03/21/2017  . Primary osteoarthritis of right knee 11/02/2016  . Nausea without vomiting 09/01/2016  . Chronic diarrhea 12/07/2014  . Segmental colitis with rectal bleeding [place] 09/12/2014  . Routine general medical examination at a health care facility 06/05/2014  . Overweight 06/13/2011  . ABNORMAL CHEST XRAY 09/30/2007  . Hyperlipidemia 02/17/2007  . Essential hypertension 01/08/2007  . Osteoarthritis 01/08/2007    BJomarie LongsPT 06/26/2017, 11:16 PM  [facility] [address] [place]  [place] Phone: [phone]   Fax:  [phone]  Name: [name] MRN: [id number] Date of Birth: [birth year]

## 2017-06-28 ENCOUNTER — Encounter: Payer: Self-pay | Admitting: Physical Therapy

## 2017-06-28 ENCOUNTER — Ambulatory Visit: Payer: Medicare Other | Admitting: Physical Therapy

## 2017-06-28 DIAGNOSIS — M25661 Stiffness of right knee, not elsewhere classified: Secondary | ICD-10-CM

## 2017-06-28 DIAGNOSIS — M6281 Muscle weakness (generalized): Secondary | ICD-10-CM

## 2017-06-28 DIAGNOSIS — R262 Difficulty in walking, not elsewhere classified: Secondary | ICD-10-CM | POA: Diagnosis not present

## 2017-06-28 DIAGNOSIS — M25561 Pain in right knee: Secondary | ICD-10-CM | POA: Diagnosis not present

## 2017-06-28 NOTE — Therapy (Signed)
[facility] [address], [place], [address] Phone: [phone]   Fax:  [phone]  Physical Therapy Treatment  Patient Details  Name: [name] MRN: [id number] Date of Birth: 01/23/1951 Referring Provider: Joni Fears, MD   Encounter Date: 06/28/2017  PT End of Session - 06/28/17 1112    Visit Number  23    Number of Visits  24    Date for PT Re-Evaluation  07/10/17    PT Start Time  1107    PT Stop Time  1146    PT Time Calculation (min)  39 min    Equipment Utilized During Treatment  Other (comment) pt hurry cane    Activity Tolerance  Patient tolerated treatment well    Behavior During Therapy  [facility] for tasks assessed/performed       Past Medical History:  Diagnosis Date  . Arthritis    back, fingers with joint pain and swelling.  chronic back pain  . Chronic back pain    arthritis   . Diverticulosis   . Elevated cholesterol    takes Niacin daily and Simvastatin  . GERD (gastroesophageal reflux disease) 06/2004   non-specific gastritis on EGD 06/2004  . Headache(784.0)    occasionally  . History of colon polyps 2004, 2009   2004:adenomatous. 2009 hyperplastic.   Marland Kitchen History of hiatal hernia   . Hypertension    takes Tenoretic and Lisinopril daily  . Mucoid cyst of joint 08/2013   right index finger  . Osteopenia 01/2014   T score -1.1 FRAX 14%/0.5%. Stable from prior DEXA  . PONV (postoperative nausea and vomiting)   . Seasonal allergies   . Stroke ([place]) 1998   x 2 - mild left-sided weakness  . Urge incontinence   . Uterine prolapse     Past Surgical History:  Procedure Laterality Date  . BACK SURGERY     x 2  . COLONOSCOPY  2004, 2009, 2014  . FINGER SURGERY    . GUM SURGERY    . GYNECOLOGIC CRYOSURGERY    . HYSTEROSCOPY W/D&C  12/07/2010   with resection of endometrial polyp  . KNEE ARTHROSCOPY Left 2008  . lip biopsy     done at MD office Fri 11/22/13  . MASS  EXCISION Right 08/15/2013   Procedure: RIGHT INDEX EXCISION MASS ;  Surgeon: Tennis Must, MD;  Location: [facility];  Service: Orthopedics;  Laterality: Right;  . NASAL SEPTUM SURGERY    . OOPHORECTOMY Right 2000  . SHOULDER ARTHROSCOPY WITH OPEN ROTATOR CUFF REPAIR AND DISTAL CLAVICLE ACROMINECTOMY Right 11/26/2013   Procedure: RIGHT SHOULDER ARTHROSCOPY WITH MINI OPEN ROTATOR CUFF REPAIR AND DISTAL CLAVICLE RESECTION, SUBACROMIAL DECOMPRESSION, POSSIBLE [facility] PATCH.;  Surgeon: [name], MD;  Location: [facility];  Service: Orthopedics;  Laterality: Right;  . TOTAL KNEE ARTHROPLASTY Right 03/21/2017  . TOTAL KNEE ARTHROPLASTY Right 03/21/2017   Procedure: RIGHT TOTAL KNEE ARTHROPLASTY;  Surgeon: [name], MD;  Location: [facility];  Service: Orthopedics;  Laterality: Right;  . TUBAL LIGATION      There were no vitals filed for this visit.  Subjective Assessment - 06/28/17 1109    Subjective  Patient reports she is still having pain and stiffness intermittently, less intense than before.  Pain is with movement.     Pertinent History  S/P R TKA on 03/21/2017. Pt had osteoarthritis and it was "bone on bone." Had 5 home health PT  visits, and a little bit of PT at the hospital. R knee hurts currently and feels tight.  PLOF: pt was using a SPC at times when her knee was acting up real bad but did not have to use her SPC all the time.      Limitations  Walking;Standing;House hold activities;Sitting    Patient Stated Goals  I just want to be able to get back to normal. (Has 5 dogs she has to take care of, do chores around the house, be active).     Currently in Pain?  Yes    Pain Score  2     Pain Location  Knee    Pain Orientation  Right    Pain Descriptors / Indicators  Tightness;Aching    Pain Type  Surgical pain 03/21/2017    Pain Onset  More than a month ago    Pain Frequency  Intermittent          Objective: AAROM right knee flexion 0-107 degrees Palpation:  mild warmth and tenderness anterior aspect right knee, decreased from previous session   Treatment:  Manual therapy: 15 min.: goal improve soft tissue elasticity, improve ROM right knee; improve gait pattern patella mobilization with patella mobilizer 3 sets focus on distraction and inferior glides STM quadriceps right LE just proximal to incision; lumbar spine right > left side for QL and paraspinals with patient prone lying   Therapeutic exercise: patient performed with demonstration, instruction, verbal and tactile cues of therapist: goal: independent with home program, improve LEFS, ambulation without AD    Sitting: Knee flexion isometric end range x 5 reps  prone:  hip extension 2 x 3 reps knee flexion x 3 reps   Standing:  side stepping along balance beam x 1 min. standing with one foot on BOSU ball 10 tosses   leg press: bilateral 35# 1 x 15-25 reps blateral 45# x 15 reps   NuStep level #4 indpendent at end of session     Patient response to treatment: required minimal cuing for correct technique with exercise, improved knee flexion to 110 degrees following STM and exercises.      PT Education - 06/28/17 1112    Education provided  Yes    Education Details  exercise instruction    Person(s) Educated  Patient    Methods  Explanation;Demonstration;Verbal cues    Comprehension  Verbalized understanding;Returned demonstration;Verbal cues required          PT Long Term Goals - 05/29/17 1648      PT LONG TERM GOAL #1   Title  Patient will be able to ambulate at least 500 ft without use of AD and no LOB to promote mobility.     Baseline  Pt currently ambulates with rw (04/12/2017); patient ambulating with SPC with pain right and left LE knees and is using rolling walker as needed (05/29/17)    Status  Revised    Target Date  06/26/17      PT LONG TERM GOAL #2   Title  Pt will improve seated R knee extension AROM to -8 degrees or less to promote ability to ambulate,  perform standing tasks.     Baseline  seated R knee extension AROM -25 degrees (04/12/2017)    Time  --    Period  --    Status  Achieved      PT LONG TERM GOAL #3   Title  Pt will improve seated R knee flexion AROM to at least  115 degrees to promote ability to ambulate, negiotiate steps, perform transfers with less difficulty.     Baseline  seated R knee flexion AROM 77 degrees (04/12/2017); AAROM right knee flexion 0-  up to 100 degrees with pain limiting further motion (05/29/17)    Time  --    Period  --    Status  Revised    Target Date  07/10/17      PT LONG TERM GOAL #4   Title  Pt will improve R knee flexion strength to at least 4+/5 and R knee extension strength to at least 5/5 to promote ability to ambulate, perform chores.     Baseline  right knee flexion 4-/5, knee extension improving 4-/5: (05/29/17)    Time  --    Period  --    Status  Revised    Target Date  07/10/17      PT LONG TERM GOAL #5   Title  Patient will improve her LEFS score to 50 or more points as a demonstration of improved function with daily tasks.    Baseline  15/80 (04/12/2017); LEFS 05/29/17 30/80    Time  --    Period  --    Status  Revised    Target Date  07/10/17            Plan - 06/28/17 1200    Clinical Impression Statement  patient continues steady progress towards goals. She continues with decreased ROM and strength and will benefit from continued physical therapy intervention to achieve goals.    Rehab Potential  Fair    Clinical Impairments Affecting Rehab Potential  (-) healing time, pain; (+) husband support, acute condition, motivated    PT Frequency  2x / week    PT Duration  6 weeks    PT Treatment/Interventions  Neuromuscular re-education;Therapeutic exercise;Therapeutic activities;Manual techniques;Aquatic Therapy;Electrical Stimulation;Iontophoresis 34m/ml Dexamethasone;Gait training;Stair training;Balance training;Functional mobility training;Patient/family education;Scar  mobilization;Passive range of motion;Dry needling    PT Next Visit Plan  ROM, gait, function, manual techniques, modalities PRN    PT Home Exercise Plan  ROM, standing hip abduction/extension; quad sets seated, knee flexion with resistive band; elevation/ use of ice for swelling/pain       Patient will benefit from skilled therapeutic intervention in order to improve the following deficits and impairments:  Pain, Difficulty walking, Decreased strength, Decreased scar mobility, Decreased range of motion  Visit Diagnosis: Acute pain of right knee  Muscle weakness (generalized)  Stiffness of right knee, not elsewhere classified  Difficulty in walking, not elsewhere classified     Problem List Patient Active Problem List   Diagnosis Date Noted  . S/P TKR (total knee replacement) using cement, right 03/21/2017  . Primary osteoarthritis of right knee 11/02/2016  . Nausea without vomiting 09/01/2016  . Chronic diarrhea 12/07/2014  . Segmental colitis with rectal bleeding ([place] 09/12/2014  . Routine general medical examination at a health care facility 06/05/2014  . Overweight 06/13/2011  . ABNORMAL CHEST XRAY 09/30/2007  . Hyperlipidemia 02/17/2007  . Essential hypertension 01/08/2007  . Osteoarthritis 01/08/2007    BJomarie LongsPT 06/29/2017, 6:02 PM  [facility] [address] [place] [address]Phone: [phone]  Fax:  [phone] Name: Stacy KAUTZMANMRN: [id number] of Birth: 3Aug 10, 1952

## 2017-07-03 ENCOUNTER — Encounter: Payer: Self-pay | Admitting: Physical Therapy

## 2017-07-03 ENCOUNTER — Other Ambulatory Visit: Payer: Self-pay | Admitting: Internal Medicine

## 2017-07-03 ENCOUNTER — Ambulatory Visit: Payer: Medicare Other | Admitting: Physical Therapy

## 2017-07-03 DIAGNOSIS — R262 Difficulty in walking, not elsewhere classified: Secondary | ICD-10-CM

## 2017-07-03 DIAGNOSIS — H524 Presbyopia: Secondary | ICD-10-CM | POA: Diagnosis not present

## 2017-07-03 DIAGNOSIS — M25661 Stiffness of right knee, not elsewhere classified: Secondary | ICD-10-CM | POA: Diagnosis not present

## 2017-07-03 DIAGNOSIS — M6281 Muscle weakness (generalized): Secondary | ICD-10-CM | POA: Diagnosis not present

## 2017-07-03 DIAGNOSIS — H25812 Combined forms of age-related cataract, left eye: Secondary | ICD-10-CM | POA: Diagnosis not present

## 2017-07-03 DIAGNOSIS — H25811 Combined forms of age-related cataract, right eye: Secondary | ICD-10-CM | POA: Diagnosis not present

## 2017-07-03 DIAGNOSIS — M25561 Pain in right knee: Secondary | ICD-10-CM | POA: Diagnosis not present

## 2017-07-03 DIAGNOSIS — H43813 Vitreous degeneration, bilateral: Secondary | ICD-10-CM | POA: Diagnosis not present

## 2017-07-03 NOTE — Therapy (Signed)
[facility] [address], [place], [address] Phone: [phone]   Fax:  [phone]  Physical Therapy Treatment  Patient Details  Name: [name] MRN: [id number] Date of Birth: [birth year] Referring Provider: Joni Fears, MD   Encounter Date: 07/03/2017  PT End of Session - 07/03/17 1202    Visit Number  24    Number of Visits  24    Date for PT Re-Evaluation  07/10/17    PT Start Time  1102    PT Stop Time  1145    PT Time Calculation (min)  43 min    Equipment Utilized During Treatment  Other (comment) pt hurry cane    Activity Tolerance  Patient tolerated treatment well    Behavior During Therapy  [facility] for tasks assessed/performed       Past Medical History:  Diagnosis Date  . Arthritis    back, fingers with joint pain and swelling.  chronic back pain  . Chronic back pain    arthritis   . Diverticulosis   . Elevated cholesterol    takes Niacin daily and Simvastatin  . GERD (gastroesophageal reflux disease) 06/2004   non-specific gastritis on EGD 06/2004  . Headache(784.0)    occasionally  . History of colon polyps 2004, 2009   2004:adenomatous. 2009 hyperplastic.   Marland Kitchen History of hiatal hernia   . Hypertension    takes Tenoretic and Lisinopril daily  . Mucoid cyst of joint 08/2013   right index finger  . Osteopenia 01/2014   T score -1.1 FRAX 14%/0.5%. Stable from prior DEXA  . PONV (postoperative nausea and vomiting)   . Seasonal allergies   . Stroke ([place]) 1998   x 2 - mild left-sided weakness  . Urge incontinence   . Uterine prolapse     Past Surgical History:  Procedure Laterality Date  . BACK SURGERY     x 2  . COLONOSCOPY  2004, 2009, 2014  . FINGER SURGERY    . GUM SURGERY    . GYNECOLOGIC CRYOSURGERY    . HYSTEROSCOPY W/D&C  12/07/2010   with resection of endometrial polyp  . KNEE ARTHROSCOPY Left 2008  . lip biopsy     done at MD office Fri 11/22/13  . MASS  EXCISION Right 08/15/2013   Procedure: RIGHT INDEX EXCISION MASS ;  Surgeon: Tennis Must, MD;  Location: [facility];  Service: Orthopedics;  Laterality: Right;  . NASAL SEPTUM SURGERY    . OOPHORECTOMY Right 2000  . SHOULDER ARTHROSCOPY WITH OPEN ROTATOR CUFF REPAIR AND DISTAL CLAVICLE ACROMINECTOMY Right 11/26/2013   Procedure: RIGHT SHOULDER ARTHROSCOPY WITH MINI OPEN ROTATOR CUFF REPAIR AND DISTAL CLAVICLE RESECTION, SUBACROMIAL DECOMPRESSION, POSSIBLE [facility] PATCH.;  Surgeon: [name], MD;  Location: [place] [facility];  Service: Orthopedics;  Laterality: Right;  . TOTAL KNEE ARTHROPLASTY Right 03/21/2017  . TOTAL KNEE ARTHROPLASTY Right 03/21/2017   Procedure: RIGHT TOTAL KNEE ARTHROPLASTY;  Surgeon: [name], MD;  Location: [facility];  Service: Orthopedics;  Laterality: Right;  . TUBAL LIGATION      There were no vitals filed for this visit.  Subjective Assessment - 07/03/17 1105    Subjective  Patient reports she is still frustrated with healing process and gets stiff in right knee with movement and increased activity    Pertinent History  S/P R TKA on 03/21/2017. Pt had osteoarthritis and it was "bone on bone." Had 5 home health PT  visits, and a little bit of PT at the hospital. R knee hurts currently and feels tight.  PLOF: pt was using a SPC at times when her knee was acting up real bad but did not have to use her SPC all the time.      Limitations  Walking;Standing;House hold activities;Sitting    Patient Stated Goals  I just want to be able to get back to normal. (Has 5 dogs she has to take care of, do chores around the house, be active).     Currently in Pain?  Yes    Pain Score  2     Pain Location  Knee    Pain Orientation  Right    Pain Descriptors / Indicators  Aching;Tightness    Pain Type  -- 03/21/2017    Pain Onset  More than a month ago    Pain Frequency  Intermittent        Objective: AAROM right knee flexion 0-100 degrees pre treatment with  stiffness/pain limiting further motion Palpation: mild warmth and tenderness anterior aspect right knee, decreased from previous session   Treatment:  Manual therapy: 15 min.: goal improve soft tissue elasticity, improve ROM right knee; improve gait pattern patella mobilization with patella mobilizer 3 sets focus on distraction and inferior glides STM quadriceps right LE just proximal to incision with patient supine lying with right LE supported   Therapeutic exercise: patient performed with demonstration, instruction, verbal and tactile cues of therapist: goal: independent with home program, improve LEFS, ambulation without AD    Supine: hip and knee flexion/extension 3 x 10 reps Knee flexion isometric end range x 5 reps 3 sets and hip extension x 5 reps 3 sets   standing hip rotary machine: hip extension  x 15 reps 70# with right LE and 55# left LE hip abduction x 10 40#    leg press: bilateral 45# 1 x 15-25 reps Single leg 25# x 15 -20 reps   NuStep level #4 indpendent x 5 min.   Patient response to treatment: required minimal cuing for correct technique and alignment with exercises. improved knee flexibility to 102/105 degrees following STM and exercises.     PT Education - 07/03/17 1226    Education provided  Yes    Education Details  exercise instruction    Person(s) Educated  Patient    Methods  Explanation;Demonstration;Verbal cues    Comprehension  Verbalized understanding;Verbal cues required;Returned demonstration          PT Long Term Goals - 05/29/17 1648      PT LONG TERM GOAL #1   Title  Patient will be able to ambulate at least 500 ft without use of AD and no LOB to promote mobility.     Baseline  Pt currently ambulates with rw (04/12/2017); patient ambulating with SPC with pain right and left LE knees and is using rolling walker as needed (05/29/17)    Status  Revised    Target Date  06/26/17      PT LONG TERM GOAL #2   Title  Pt will improve seated R  knee extension AROM to -8 degrees or less to promote ability to ambulate, perform standing tasks.     Baseline  seated R knee extension AROM -25 degrees (04/12/2017)    Time  --    Period  --    Status  Achieved      PT LONG TERM GOAL #3   Title  Pt will improve seated  R knee flexion AROM to at least 115 degrees to promote ability to ambulate, negiotiate steps, perform transfers with less difficulty.     Baseline  seated R knee flexion AROM 77 degrees (04/12/2017); AAROM right knee flexion 0-  up to 100 degrees with pain limiting further motion (05/29/17)    Time  --    Period  --    Status  Revised    Target Date  07/10/17      PT LONG TERM GOAL #4   Title  Pt will improve R knee flexion strength to at least 4+/5 and R knee extension strength to at least 5/5 to promote ability to ambulate, perform chores.     Baseline  right knee flexion 4-/5, knee extension improving 4-/5: (05/29/17)    Time  --    Period  --    Status  Revised    Target Date  07/10/17      PT LONG TERM GOAL #5   Title  Patient will improve her LEFS score to 50 or more points as a demonstration of improved function with daily tasks.    Baseline  15/80 (04/12/2017); LEFS 05/29/17 30/80    Time  --    Period  --    Status  Revised    Target Date  07/10/17            Plan - 07/03/17 1227    Clinical Impression Statement  Patient continues with pain and swelling as primary limitations to progression with ROM and function with daily tasks. She continues to progress steadily with goals and will benefit from additional physical therapy intervention to achieve goals.     Rehab Potential  Fair    Clinical Impairments Affecting Rehab Potential  (-) healing time, pain; (+) husband support, acute condition, motivated    PT Frequency  2x / week    PT Duration  6 weeks    PT Treatment/Interventions  Neuromuscular re-education;Therapeutic exercise;Therapeutic activities;Manual techniques;Aquatic Therapy;Electrical  Stimulation;Iontophoresis 46m/ml Dexamethasone;Gait training;Stair training;Balance training;Functional mobility training;Patient/family education;Scar mobilization;Passive range of motion;Dry needling    PT Next Visit Plan  ROM, gait, function, manual techniques, modalities PRN    PT Home Exercise Plan  ROM, standing hip abduction/extension; quad sets seated, knee flexion with resistive band; elevation/ use of ice for swelling/pain       Patient will benefit from skilled therapeutic intervention in order to improve the following deficits and impairments:  Pain, Difficulty walking, Decreased strength, Decreased scar mobility, Decreased range of motion  Visit Diagnosis: Acute pain of right knee  Muscle weakness (generalized)  Stiffness of right knee, not elsewhere classified  Difficulty in walking, not elsewhere classified     Problem List Patient Active Problem List   Diagnosis Date Noted  . S/P TKR (total knee replacement) using cement, right 03/21/2017  . Primary osteoarthritis of right knee 11/02/2016  . Nausea without vomiting 09/01/2016  . Chronic diarrhea 12/07/2014  . Segmental colitis with rectal bleeding ([place] 09/12/2014  . Routine general medical examination at a health care facility 06/05/2014  . Overweight 06/13/2011  . ABNORMAL CHEST XRAY 09/30/2007  . Hyperlipidemia 02/17/2007  . Essential hypertension 01/08/2007  . Osteoarthritis 01/08/2007    BJomarie LongsPT 07/04/2017, 12:32 PM  [facility] [address] [place] [address]Phone: [phone]  Fax:  [phone] Name: Stacy SILBERMANMRN: [id number] of Birth: 3February 06, 1952

## 2017-07-05 ENCOUNTER — Ambulatory Visit: Payer: Medicare Other | Admitting: Physical Therapy

## 2017-07-05 ENCOUNTER — Encounter: Payer: Self-pay | Admitting: Physical Therapy

## 2017-07-05 DIAGNOSIS — R262 Difficulty in walking, not elsewhere classified: Secondary | ICD-10-CM | POA: Diagnosis not present

## 2017-07-05 DIAGNOSIS — M6281 Muscle weakness (generalized): Secondary | ICD-10-CM | POA: Diagnosis not present

## 2017-07-05 DIAGNOSIS — M25661 Stiffness of right knee, not elsewhere classified: Secondary | ICD-10-CM

## 2017-07-05 DIAGNOSIS — M25561 Pain in right knee: Secondary | ICD-10-CM | POA: Diagnosis not present

## 2017-07-05 NOTE — Therapy (Signed)
[facility] [address] [place], [address] Phone: [phone]   Fax:  [phone]  Physical Therapy Treatment  Patient Details  Name: [name] MRN: [id number] Date of Birth: 11/28/1950 Referring Provider: Joni Fears, MD   Encounter Date: 07/05/2017  PT End of Session - 07/05/17 1126    Visit Number  25    Number of Visits  36    Date for PT Re-Evaluation  08/16/17    PT Start Time  1100    PT Stop Time  1150    PT Time Calculation (min)  50 min    Equipment Utilized During Treatment  Other (comment) pt hurry cane    Activity Tolerance  Patient tolerated treatment well    Behavior During Therapy  [facility] for tasks assessed/performed       Past Medical History:  Diagnosis Date  . Arthritis    back, fingers with joint pain and swelling.  chronic back pain  . Chronic back pain    arthritis   . Diverticulosis   . Elevated cholesterol    takes Niacin daily and Simvastatin  . GERD (gastroesophageal reflux disease) 06/2004   non-specific gastritis on EGD 06/2004  . Headache(784.0)    occasionally  . History of colon polyps 2004, 2009   2004:adenomatous. 2009 hyperplastic.   Marland Kitchen History of hiatal hernia   . Hypertension    takes Tenoretic and Lisinopril daily  . Mucoid cyst of joint 08/2013   right index finger  . Osteopenia 01/2014   T score -1.1 FRAX 14%/0.5%. Stable from prior DEXA  . PONV (postoperative nausea and vomiting)   . Seasonal allergies   . Stroke ([place]) 1998   x 2 - mild left-sided weakness  . Urge incontinence   . Uterine prolapse     Past Surgical History:  Procedure Laterality Date  . BACK SURGERY     x 2  . COLONOSCOPY  2004, 2009, 2014  . FINGER SURGERY    . GUM SURGERY    . GYNECOLOGIC CRYOSURGERY    . HYSTEROSCOPY W/D&C  12/07/2010   with resection of endometrial polyp  . KNEE ARTHROSCOPY Left 2008  . lip biopsy     done at MD office Fri 11/22/13  . MASS  EXCISION Right 08/15/2013   Procedure: RIGHT INDEX EXCISION MASS ;  Surgeon: Tennis Must, MD;  Location: [place];  Service: Orthopedics;  Laterality: Right;  . NASAL SEPTUM SURGERY    . OOPHORECTOMY Right 2000  . SHOULDER ARTHROSCOPY WITH OPEN ROTATOR CUFF REPAIR AND DISTAL CLAVICLE ACROMINECTOMY Right 11/26/2013   Procedure: RIGHT SHOULDER ARTHROSCOPY WITH MINI OPEN ROTATOR CUFF REPAIR AND DISTAL CLAVICLE RESECTION, SUBACROMIAL DECOMPRESSION, POSSIBLE [facility] PATCH.;  Surgeon: [name], MD;  Location: [place];  Service: Orthopedics;  Laterality: Right;  . TOTAL KNEE ARTHROPLASTY Right 03/21/2017  . TOTAL KNEE ARTHROPLASTY Right 03/21/2017   Procedure: RIGHT TOTAL KNEE ARTHROPLASTY;  Surgeon: [name], MD;  Location: [place];  Service: Orthopedics;  Laterality: Right;  . TUBAL LIGATION      There were no vitals filed for this visit.  Subjective Assessment - 07/05/17 1135    Subjective  Patient reports she continues with stiffness right knee and is increased with being up on her feet during the day.     Pertinent History  S/P R TKA on 03/21/2017. Pt had osteoarthritis and it was "bone on bone." Had 5 home health  PT visits, and a little bit of PT at the hospital. R knee hurts currently and feels tight.  PLOF: pt was using a SPC at times when her knee was acting up real bad but did not have to use her SPC all the time.      Limitations  Walking;Standing;House hold activities;Sitting    Patient Stated Goals  I just want to be able to get back to normal. (Has 5 dogs she has to take care of, do chores around the house, be active).     Currently in Pain?  Yes    Pain Score  3     Pain Location  Knee    Pain Orientation  Right    Pain Descriptors / Indicators  Aching;Tightness    Pain Type  Surgical pain;Acute pain 03/21/2017    Pain Onset  More than a month ago    Pain Frequency  Intermittent        Objective: AAROM right knee flexion 0-100 degrees pre  treatment with stiffness/pain limiting further motion; up to 108 degrees flexion post treatment  Palpation: mild warmth and tenderness anterior aspect right knee, decreased from previous session   Treatment:  Manual therapy: 15 min.: goal improve soft tissue elasticity, improve ROM right knee; improve gait pattern patella mobilization with patella mobilizer 3 sets focus on distraction and inferior glides STM quadriceps right LE just proximal to incision with patient supine lying with right LE supported   Therapeutic exercise: patient performed with demonstration, instruction, verbal and tactile cues of therapist: goal: independent with home program, improve LEFS, ambulation without AD    Supine: hip and knee flexion/extension 3 x 10 reps with isometric hold knee flexion x 5 reps each set SAQ 4# x 15, 2# x 15 (pain 4/10 with both weights)   standing hip rotary machine: hip extension 2  x 15 reps 85# with right LE and 1 x15 @ 85# and 70# left LE hip abduction x 10 40#   leg press: bilateral 35# 1 x 20 reps (increased pain at 45# so decreased to 35# 2/10) Single leg 25# x 20 reps   Patient response to treatment: Pateint requried minimal cuing for exercise technique. improved knee flexibility up to 108 degrees  Flexion following STM and exercises.    [facility] PT Assessment - 07/06/17 0001      Assessment   Medical Diagnosis  R total knee replacement     Onset Date/Surgical Date  03/21/17        PT Education - 07/05/17 1120    Education provided  Yes    Education Details  exercise instruction    Person(s) Educated  Patient    Methods  Explanation;Demonstration;Verbal cues    Comprehension  Verbalized understanding;Returned demonstration;Verbal cues required          PT Long Term Goals - 07/05/17 1200      PT LONG TERM GOAL #1   Title  Patient will be able to ambulate at least 500 ft without use of AD and no LOB to promote mobility.     Baseline  Pt currently ambulates with rw  (04/12/2017); patient ambulating with SPC with pain right and left LE knees and is using rolling walker as needed (05/29/17); 07/05/17 using cane for support    Status  Revised    Target Date  07/26/17      PT LONG TERM GOAL #2   Title  Pt will improve seated R knee extension AROM to -8 degrees  or less to promote ability to ambulate, perform standing tasks.     Baseline  seated R knee extension AROM -25 degrees (04/12/2017)    Status  Achieved      PT LONG TERM GOAL #3   Title  Pt will improve seated R knee flexion AROM to at least 115 degrees to promote ability to ambulate, negiotiate steps, perform transfers with less difficulty.     Baseline  seated R knee flexion AROM 77 degrees (04/12/2017); AAROM right knee flexion 0-  up to 100 degrees with pain limiting further motion (05/29/17)    Status  Revised    Target Date  08/16/17      PT LONG TERM GOAL #4   Title  Pt will improve R knee flexion strength to at least 4+/5 and R knee extension strength to at least 5/5 to promote ability to ambulate, perform chores.     Baseline  right knee flexion 4-/5, knee extension improving 4-/5: (05/29/17): knee extension 4/5 with pain, knee flexion 4/5 with pain    Status  Revised    Target Date  08/16/17      PT LONG TERM GOAL #5   Title  Patient will improve her LEFS score to 50 or more points as a demonstration of improved function with daily tasks.    Baseline  15/80 (04/12/2017); LEFS 05/29/17 30/80; LEFS 07/05/17 27/80    Status  Revised    Target Date  08/16/17            Plan - 07/05/17 1200    Clinical Impression Statement  Patient is progressing steadily, slowly with goals primarily due to swelling and pain in right knee. Her LEFS score has remained about the same at 27/80 indicating moderate to severe self perceived impairment with daily function. She is progressing with ROM and strength and should continue to improve with additional physical therapy intervention.     Rehab Potential  Fair     Clinical Impairments Affecting Rehab Potential  (-) healing time, pain; (+) husband support, acute condition, motivated    PT Frequency  2x / week    PT Duration  6 weeks    PT Treatment/Interventions  Neuromuscular re-education;Therapeutic exercise;Therapeutic activities;Manual techniques;Aquatic Therapy;Electrical Stimulation;Iontophoresis 31m/ml Dexamethasone;Gait training;Stair training;Balance training;Functional mobility training;Patient/family education;Scar mobilization;Passive range of motion;Dry needling    PT Next Visit Plan  ROM, gait, function, manual techniques, modalities PRN    PT Home Exercise Plan  ROM, standing hip abduction/extension; quad sets seated, knee flexion with resistive band; elevation/ use of ice for swelling/pain    Consulted and Agree with Plan of Care  Patient       Patient will benefit from skilled therapeutic intervention in order to improve the following deficits and impairments:  Pain, Difficulty walking, Decreased strength, Decreased scar mobility, Decreased range of motion  Visit Diagnosis: Acute pain of right knee - Plan: PT plan of care cert/re-cert  Muscle weakness (generalized) - Plan: PT plan of care cert/re-cert  Stiffness of right knee, not elsewhere classified - Plan: PT plan of care cert/re-cert  Difficulty in walking, not elsewhere classified - Plan: PT plan of care cert/re-cert     Problem List Patient Active Problem List   Diagnosis Date Noted  . S/P TKR (total knee replacement) using cement, right 03/21/2017  . Primary osteoarthritis of right knee 11/02/2016  . Nausea without vomiting 09/01/2016  . Chronic diarrhea 12/07/2014  . Segmental colitis with rectal bleeding ([place] 09/12/2014  . Routine general medical examination  at a health care facility 06/05/2014  . Overweight 06/13/2011  . ABNORMAL CHEST XRAY 09/30/2007  . Hyperlipidemia 02/17/2007  . Essential hypertension 01/08/2007  . Osteoarthritis 01/08/2007    Jomarie Longs PT 07/06/2017, 11:07 AM  [facility] [address], [place], [address] Phone: [phone]   Fax:  [phone]  Name: MIDGE MOMON MRN: [id number] Date of Birth: [birth year]

## 2017-07-10 ENCOUNTER — Ambulatory Visit: Payer: Medicare Other | Attending: Orthopaedic Surgery | Admitting: Physical Therapy

## 2017-07-10 ENCOUNTER — Encounter: Payer: Self-pay | Admitting: Physical Therapy

## 2017-07-10 DIAGNOSIS — M25661 Stiffness of right knee, not elsewhere classified: Secondary | ICD-10-CM | POA: Insufficient documentation

## 2017-07-10 DIAGNOSIS — M25561 Pain in right knee: Secondary | ICD-10-CM

## 2017-07-10 DIAGNOSIS — R262 Difficulty in walking, not elsewhere classified: Secondary | ICD-10-CM | POA: Diagnosis not present

## 2017-07-10 DIAGNOSIS — M6281 Muscle weakness (generalized): Secondary | ICD-10-CM | POA: Diagnosis not present

## 2017-07-10 NOTE — Therapy (Signed)
[facility] [address] [place], [address] Phone: [phone]   Fax:  [phone]  Physical Therapy Treatment  Patient Details  Name: [name] MRN: [id number] Date of Birth: Feb 21, 1951 Referring Provider: Joni Fears, MD   Encounter Date: 07/10/2017  PT End of Session - 07/10/17 1102    Visit Number  26    Number of Visits  36    Date for PT Re-Evaluation  08/16/17    PT Start Time  1100    PT Stop Time  1153    PT Time Calculation (min)  53 min    Equipment Utilized During Treatment  Other (comment) pt hurry cane    Activity Tolerance  Patient tolerated treatment well    Behavior During Therapy  [facility] for tasks assessed/performed       Past Medical History:  Diagnosis Date  . Arthritis    back, fingers with joint pain and swelling.  chronic back pain  . Chronic back pain    arthritis   . Diverticulosis   . Elevated cholesterol    takes Niacin daily and Simvastatin  . GERD (gastroesophageal reflux disease) 06/2004   non-specific gastritis on EGD 06/2004  . Headache(784.0)    occasionally  . History of colon polyps 2004, 2009   2004:adenomatous. 2009 hyperplastic.   Marland Kitchen History of hiatal hernia   . Hypertension    takes Tenoretic and Lisinopril daily  . Mucoid cyst of joint 08/2013   right index finger  . Osteopenia 01/2014   T score -1.1 FRAX 14%/0.5%. Stable from prior DEXA  . PONV (postoperative nausea and vomiting)   . Seasonal allergies   . Stroke ([place]) 1998   x 2 - mild left-sided weakness  . Urge incontinence   . Uterine prolapse     Past Surgical History:  Procedure Laterality Date  . BACK SURGERY     x 2  . COLONOSCOPY  2004, 2009, 2014  . FINGER SURGERY    . GUM SURGERY    . GYNECOLOGIC CRYOSURGERY    . HYSTEROSCOPY W/D&C  12/07/2010   with resection of endometrial polyp  . KNEE ARTHROSCOPY Left 2008  . lip biopsy     done at MD office Fri 11/22/13  . MASS  EXCISION Right 08/15/2013   Procedure: RIGHT INDEX EXCISION MASS ;  Surgeon: Tennis Must, MD;  Location: [place];  Service: Orthopedics;  Laterality: Right;  . NASAL SEPTUM SURGERY    . OOPHORECTOMY Right 2000  . SHOULDER ARTHROSCOPY WITH OPEN ROTATOR CUFF REPAIR AND DISTAL CLAVICLE ACROMINECTOMY Right 11/26/2013   Procedure: RIGHT SHOULDER ARTHROSCOPY WITH MINI OPEN ROTATOR CUFF REPAIR AND DISTAL CLAVICLE RESECTION, SUBACROMIAL DECOMPRESSION, POSSIBLE [facility] PATCH.;  Surgeon: [name], MD;  Location: [place];  Service: Orthopedics;  Laterality: Right;  . TOTAL KNEE ARTHROPLASTY Right 03/21/2017  . TOTAL KNEE ARTHROPLASTY Right 03/21/2017   Procedure: RIGHT TOTAL KNEE ARTHROPLASTY;  Surgeon: [name], MD;  Location: [place];  Service: Orthopedics;  Laterality: Right;  . TUBAL LIGATION      There were no vitals filed for this visit.  Subjective Assessment - 07/10/17 1101    Subjective  Patient reports she feels looser and then more stiff at other times    Pertinent History  S/P R TKA on 03/21/2017. Pt had osteoarthritis and it was "bone on bone." Had 5 home health PT visits, and a little bit of PT at  the hospital. R knee hurts currently and feels tight.  PLOF: pt was using a SPC at times when her knee was acting up real bad but did not have to use her SPC all the time.      Limitations  Walking;Standing;House hold activities;Sitting    Patient Stated Goals  I just want to be able to get back to normal. (Has 5 dogs she has to take care of, do chores around the house, be active).     Currently in Pain?  Yes    Pain Score  2     Pain Location  Knee    Pain Orientation  Right    Pain Descriptors / Indicators  Tightness    Pain Type  Surgical pain;Acute pain 03/21/2017    Pain Onset  More than a month ago    Pain Frequency  Intermittent         Objective: AAROM right knee flexion 0-100 degrees pre treatment with stiffness/pain limiting further motion; up  to 107 degrees flexion post treatment  Palpation: tenderness medial aspect right knee, spasms right quadriceps central aspect just proximal to incision scar   Treatment:  Manual therapy: 15 min.: goal improve soft tissue elasticity, improve ROM right knee; improve gait pattern patella mobilization with patella mobilizer 3 sets focus on distraction and inferior glides STM quadriceps right LE just proximal to incision with patient supine lying with right LE supported   Therapeutic exercise: patient performed with demonstration, instruction, verbal and tactile cues of therapist: goal: independent with home program, improve LEFS, ambulation without AD    Supine: hip and knee flexion/extension 3 x 10 reps with isometric hold knee flexion x 5 reps each set SAQ with manual resistance 90 - 40 degrees x 10 knee flexion with green resistive band 2 x 15 reps right LE    standing hip rotary machine: hip extension  x 15 reps 85# with right/left LE and x 15 @ 70# right and left  hip abduction x 10 40#   leg press: bilateral 45# 1 x 20 reps  Single leg 15#  2 x 15 reps each LE   NuStep x 10 min at end of sesion independent level #4   Patient response to treatment: patient with improved ROM up to 107 degrees flexion following STM/exercise supine.      PT Education - 07/10/17 1246    Education provided  Yes    Education Details  exercise instruction for technique    Person(s) Educated  Patient    Methods  Explanation;Demonstration;Verbal cues    Comprehension  Verbalized understanding;Returned demonstration;Verbal cues required          PT Long Term Goals - 07/05/17 1200      PT LONG TERM GOAL #1   Title  Patient will be able to ambulate at least 500 ft without use of AD and no LOB to promote mobility.     Baseline  Pt currently ambulates with rw (04/12/2017); patient ambulating with SPC with pain right and left LE knees and is using rolling walker as needed (05/29/17); 07/05/17 using cane  for support    Status  Revised    Target Date  07/26/17      PT LONG TERM GOAL #2   Title  Pt will improve seated R knee extension AROM to -8 degrees or less to promote ability to ambulate, perform standing tasks.     Baseline  seated R knee extension AROM -25 degrees (04/12/2017)  Status  Achieved      PT LONG TERM GOAL #3   Title  Pt will improve seated R knee flexion AROM to at least 115 degrees to promote ability to ambulate, negiotiate steps, perform transfers with less difficulty.     Baseline  seated R knee flexion AROM 77 degrees (04/12/2017); AAROM right knee flexion 0-  up to 100 degrees with pain limiting further motion (05/29/17)    Status  Revised    Target Date  08/16/17      PT LONG TERM GOAL #4   Title  Pt will improve R knee flexion strength to at least 4+/5 and R knee extension strength to at least 5/5 to promote ability to ambulate, perform chores.     Baseline  right knee flexion 4-/5, knee extension improving 4-/5: (05/29/17): knee extension 4/5 with pain, knee flexion 4/5 with pain    Status  Revised    Target Date  08/16/17      PT LONG TERM GOAL #5   Title  Patient will improve her LEFS score to 50 or more points as a demonstration of improved function with daily tasks.    Baseline  15/80 (04/12/2017); LEFS 05/29/17 30/80; LEFS 07/05/17 27/80    Status  Revised    Target Date  08/16/17            Plan - 07/10/17 1243    Clinical Impression Statement  Patient continues to progress steadily with goals. She requires moderat cuing and repetition to perform exercises with good technqiue. She will require continued physical therapy intervention to achieve full pain free motion for improved function with daily tasks.     Rehab Potential  Fair    Clinical Impairments Affecting Rehab Potential  (-) healing time, pain; (+) husband support, acute condition, motivated    PT Frequency  2x / week    PT Duration  6 weeks    PT Treatment/Interventions  Neuromuscular  re-education;Therapeutic exercise;Therapeutic activities;Manual techniques;Aquatic Therapy;Electrical Stimulation;Iontophoresis 43m/ml Dexamethasone;Gait training;Stair training;Balance training;Functional mobility training;Patient/family education;Scar mobilization;Passive range of motion;Dry needling    PT Next Visit Plan  ROM, gait, function, manual techniques, modalities PRN    PT Home Exercise Plan  ROM, standing hip abduction/extension; quad sets seated, knee flexion with resistive band; elevation/ use of ice for swelling/pain       Patient will benefit from skilled therapeutic intervention in order to improve the following deficits and impairments:  Pain, Difficulty walking, Decreased strength, Decreased scar mobility, Decreased range of motion  Visit Diagnosis: Acute pain of right knee  Muscle weakness (generalized)  Stiffness of right knee, not elsewhere classified  Difficulty in walking, not elsewhere classified     Problem List Patient Active Problem List   Diagnosis Date Noted  . S/P TKR (total knee replacement) using cement, right 03/21/2017  . Primary osteoarthritis of right knee 11/02/2016  . Nausea without vomiting 09/01/2016  . Chronic diarrhea 12/07/2014  . Segmental colitis with rectal bleeding ([place] 09/12/2014  . Routine general medical examination at a health care facility 06/05/2014  . Overweight 06/13/2011  . ABNORMAL CHEST XRAY 09/30/2007  . Hyperlipidemia 02/17/2007  . Essential hypertension 01/08/2007  . Osteoarthritis 01/08/2007    BJomarie LongsPT 07/10/2017, 12:46 PM  [facility] [address] [place] [address]Phone: [phone]  Fax:  [phone] Name: Stacy DEBLOISMRN: [id number] of Birth: [birth year]

## 2017-07-12 ENCOUNTER — Encounter: Payer: Self-pay | Admitting: Physical Therapy

## 2017-07-12 ENCOUNTER — Ambulatory Visit: Payer: Medicare Other | Admitting: Physical Therapy

## 2017-07-12 DIAGNOSIS — M6281 Muscle weakness (generalized): Secondary | ICD-10-CM

## 2017-07-12 DIAGNOSIS — M25661 Stiffness of right knee, not elsewhere classified: Secondary | ICD-10-CM | POA: Diagnosis not present

## 2017-07-12 DIAGNOSIS — R262 Difficulty in walking, not elsewhere classified: Secondary | ICD-10-CM

## 2017-07-12 DIAGNOSIS — M25561 Pain in right knee: Secondary | ICD-10-CM

## 2017-07-12 NOTE — Therapy (Signed)
[facility] [address], [place], [address] Phone: [phone]   Fax:  [phone]  Physical Therapy Treatment  Patient Details  Name: BENICIA BERGEVIN MRN: [id number] Date of Birth: Apr 13, 1950 Referring Provider: Joni Fears, MD   Encounter Date: 07/12/2017  PT End of Session - 07/12/17 1440    Visit Number  27    Number of Visits  36    Date for PT Re-Evaluation  08/16/17    PT Start Time  1601    PT Stop Time  1523    PT Time Calculation (min)  48 min    Equipment Utilized During Treatment  Other (comment) pt hurry cane    Activity Tolerance  Patient tolerated treatment well    Behavior During Therapy  [facility] for tasks assessed/performed       Past Medical History:  Diagnosis Date  . Arthritis    back, fingers with joint pain and swelling.  chronic back pain  . Chronic back pain    arthritis   . Diverticulosis   . Elevated cholesterol    takes Niacin daily and Simvastatin  . GERD (gastroesophageal reflux disease) 06/2004   non-specific gastritis on EGD 06/2004  . Headache(784.0)    occasionally  . History of colon polyps 2004, 2009   2004:adenomatous. 2009 hyperplastic.   Marland Kitchen History of hiatal hernia   . Hypertension    takes Tenoretic and Lisinopril daily  . Mucoid cyst of joint 08/2013   right index finger  . Osteopenia 01/2014   T score -1.1 FRAX 14%/0.5%. Stable from prior DEXA  . PONV (postoperative nausea and vomiting)   . Seasonal allergies   . Stroke ([place]) 1998   x 2 - mild left-sided weakness  . Urge incontinence   . Uterine prolapse     Past Surgical History:  Procedure Laterality Date  . BACK SURGERY     x 2  . COLONOSCOPY  2004, 2009, 2014  . FINGER SURGERY    . GUM SURGERY    . GYNECOLOGIC CRYOSURGERY    . HYSTEROSCOPY W/D&C  12/07/2010   with resection of endometrial polyp  . KNEE ARTHROSCOPY Left 2008  . lip biopsy     done at MD office Fri 11/22/13  . MASS  EXCISION Right 08/15/2013   Procedure: RIGHT INDEX EXCISION MASS ;  Surgeon: Tennis Must, MD;  Location: [place];  Service: Orthopedics;  Laterality: Right;  . NASAL SEPTUM SURGERY    . OOPHORECTOMY Right 2000  . SHOULDER ARTHROSCOPY WITH OPEN ROTATOR CUFF REPAIR AND DISTAL CLAVICLE ACROMINECTOMY Right 11/26/2013   Procedure: RIGHT SHOULDER ARTHROSCOPY WITH MINI OPEN ROTATOR CUFF REPAIR AND DISTAL CLAVICLE RESECTION, SUBACROMIAL DECOMPRESSION, POSSIBLE [employer] PATCH.;  Surgeon: [name], MD;  Location: [place];  Service: Orthopedics;  Laterality: Right;  . TOTAL KNEE ARTHROPLASTY Right 03/21/2017  . TOTAL KNEE ARTHROPLASTY Right 03/21/2017   Procedure: RIGHT TOTAL KNEE ARTHROPLASTY;  Surgeon: [name], MD;  Location: [place];  Service: Orthopedics;  Laterality: Right;  . TUBAL LIGATION      There were no vitals filed for this visit.  Subjective Assessment - 07/12/17 1439    Subjective  Patient reports she feels about the same and has intermittent stiffness.     Pertinent History  S/P R TKA on 03/21/2017. Pt had osteoarthritis and it was "bone on bone." Had 5 home health PT visits, and a little bit of PT at  the hospital. R knee hurts currently and feels tight.  PLOF: pt was using a SPC at times when her knee was acting up real bad but did not have to use her SPC all the time.      Limitations  Walking;Standing;House hold activities;Sitting    Patient Stated Goals  I just want to be able to get back to normal. (Has 5 dogs she has to take care of, do chores around the house, be active).     Currently in Pain?  Yes    Pain Score  4     Pain Location  Knee    Pain Orientation  Right    Pain Descriptors / Indicators  Tightness    Pain Onset  More than a month ago    Pain Frequency  Intermittent         Objective: AAROM right knee flexion 0-105 degrees pre treatment with stiffness/pain limiting further motion Palpation: tenderness medial aspect right knee,  spasms right quadriceps central aspect just proximal to incision scar. improved from previous session   Treatment:  Manual therapy: 15 min.: goal improve soft tissue elasticity, improve ROM right knee; improve gait pattern patella mobilization with patella mobilizer 3 sets focus on distraction and inferior glides with improved mobility STM quadriceps right LE just proximal to incision with patient supine lying with right LE supported   Therapeutic exercise: patient performed with demonstration, instruction, verbal and tactile cues of therapist: goal: independent with home program, improve LEFS, ambulation without AD    Supine: hip and knee flexion/extension 3 x 5 reps with isometric hold knee flexion x 5 reps each set   standing hip rotary machine: hip extension  x 15 reps 70# with right/left LE  hip abduction x 10 40#   leg press: bilateral 45# 1 x 20 reps  Single leg 15#  x 20 reps each LE    NuStep x 10 min at end of sesion independent level #4   Patient response to treatment: patient with improved ROM up to 110 degrees flexion following STM/exercise supine. Patient required moderate assistance and cuing to perform exercises with appropriate intensity and with good technique     PT Education - 07/12/17 1535    Education provided  Yes    Education Details  exercise instruction for technique    Person(s) Educated  Patient    Methods  Explanation;Demonstration;Verbal cues    Comprehension  Verbalized understanding;Returned demonstration;Verbal cues required          PT Long Term Goals - 07/05/17 1200      PT LONG TERM GOAL #1   Title  Patient will be able to ambulate at least 500 ft without use of AD and no LOB to promote mobility.     Baseline  Pt currently ambulates with rw (04/12/2017); patient ambulating with SPC with pain right and left LE knees and is using rolling walker as needed (05/29/17); 07/05/17 using cane for support    Status  Revised    Target Date  07/26/17       PT LONG TERM GOAL #2   Title  Pt will improve seated R knee extension AROM to -8 degrees or less to promote ability to ambulate, perform standing tasks.     Baseline  seated R knee extension AROM -25 degrees (04/12/2017)    Status  Achieved      PT LONG TERM GOAL #3   Title  Pt will improve seated R knee flexion AROM to at  least 115 degrees to promote ability to ambulate, negiotiate steps, perform transfers with less difficulty.     Baseline  seated R knee flexion AROM 77 degrees (04/12/2017); AAROM right knee flexion 0-  up to 100 degrees with pain limiting further motion (05/29/17)    Status  Revised    Target Date  08/16/17      PT LONG TERM GOAL #4   Title  Pt will improve R knee flexion strength to at least 4+/5 and R knee extension strength to at least 5/5 to promote ability to ambulate, perform chores.     Baseline  right knee flexion 4-/5, knee extension improving 4-/5: (05/29/17): knee extension 4/5 with pain, knee flexion 4/5 with pain    Status  Revised    Target Date  08/16/17      PT LONG TERM GOAL #5   Title  Patient will improve her LEFS score to 50 or more points as a demonstration of improved function with daily tasks.    Baseline  15/80 (04/12/2017); LEFS 05/29/17 30/80; LEFS 07/05/17 27/80    Status  Revised    Target Date  08/16/17            Plan - 07/12/17 1531    Clinical Impression Statement  Patient continues to progress with goals with improving ROM slowly due to pain.     Rehab Potential  Fair    Clinical Impairments Affecting Rehab Potential  (-) healing time, pain; (+) husband support, acute condition, motivated    PT Frequency  2x / week    PT Duration  6 weeks    PT Treatment/Interventions  Neuromuscular re-education;Therapeutic exercise;Therapeutic activities;Manual techniques;Aquatic Therapy;Electrical Stimulation;Iontophoresis 45m/ml Dexamethasone;Gait training;Stair training;Balance training;Functional mobility training;Patient/family education;Scar  mobilization;Passive range of motion;Dry needling    PT Next Visit Plan  ROM, gait, function, manual techniques, modalities PRN    PT Home Exercise Plan  ROM, standing hip abduction/extension; quad sets seated, knee flexion with resistive band; elevation/ use of ice for swelling/pain       Patient will benefit from skilled therapeutic intervention in order to improve the following deficits and impairments:  Pain, Difficulty walking, Decreased strength, Decreased scar mobility, Decreased range of motion  Visit Diagnosis: Acute pain of right knee  Muscle weakness (generalized)  Stiffness of right knee, not elsewhere classified  Difficulty in walking, not elsewhere classified     Problem List Patient Active Problem List   Diagnosis Date Noted  . S/P TKR (total knee replacement) using cement, right 03/21/2017  . Primary osteoarthritis of right knee 11/02/2016  . Nausea without vomiting 09/01/2016  . Chronic diarrhea 12/07/2014  . Segmental colitis with rectal bleeding ([place] 09/12/2014  . Routine general medical examination at a health care facility 06/05/2014  . Overweight 06/13/2011  . ABNORMAL CHEST XRAY 09/30/2007  . Hyperlipidemia 02/17/2007  . Essential hypertension 01/08/2007  . Osteoarthritis 01/08/2007    BJomarie LongsPT 07/12/2017, 3:46 PM  [facility] [address] [place] [address]Phone: [phone]  Fax:  [phone] Name: NBERTHA LOKKENMRN: [id number] of Birth: [birth year]

## 2017-07-17 ENCOUNTER — Encounter: Payer: Self-pay | Admitting: Physical Therapy

## 2017-07-17 ENCOUNTER — Ambulatory Visit: Payer: Medicare Other | Admitting: Physical Therapy

## 2017-07-17 DIAGNOSIS — M6281 Muscle weakness (generalized): Secondary | ICD-10-CM

## 2017-07-17 DIAGNOSIS — R262 Difficulty in walking, not elsewhere classified: Secondary | ICD-10-CM

## 2017-07-17 DIAGNOSIS — M25661 Stiffness of right knee, not elsewhere classified: Secondary | ICD-10-CM

## 2017-07-17 DIAGNOSIS — M25561 Pain in right knee: Secondary | ICD-10-CM | POA: Diagnosis not present

## 2017-07-17 NOTE — Therapy (Signed)
[facility] [address] [place], [address] Phone: [phone]   Fax:  [phone]  Physical Therapy Treatment  Patient Details  Name: [name] MRN: [id number] Date of Birth: 10/01/1950 Referring Provider: Joni Fears, MD   Encounter Date: 07/17/2017  PT End of Session - 07/17/17 1444    Visit Number  28    Number of Visits  36    Date for PT Re-Evaluation  08/16/17    PT Start Time  1021    PT Stop Time  1530    PT Time Calculation (min)  51 min    Equipment Utilized During Treatment  Other (comment) pt hurry cane    Activity Tolerance  Patient tolerated treatment well    Behavior During Therapy  [facility] for tasks assessed/performed       Past Medical History:  Diagnosis Date  . Arthritis    back, fingers with joint pain and swelling.  chronic back pain  . Chronic back pain    arthritis   . Diverticulosis   . Elevated cholesterol    takes Niacin daily and Simvastatin  . GERD (gastroesophageal reflux disease) 06/2004   non-specific gastritis on EGD 06/2004  . Headache(784.0)    occasionally  . History of colon polyps 2004, 2009   2004:adenomatous. 2009 hyperplastic.   Marland Kitchen History of hiatal hernia   . Hypertension    takes Tenoretic and Lisinopril daily  . Mucoid cyst of joint 08/2013   right index finger  . Osteopenia 01/2014   T score -1.1 FRAX 14%/0.5%. Stable from prior DEXA  . PONV (postoperative nausea and vomiting)   . Seasonal allergies   . Stroke ([place]) 1998   x 2 - mild left-sided weakness  . Urge incontinence   . Uterine prolapse     Past Surgical History:  Procedure Laterality Date  . BACK SURGERY     x 2  . COLONOSCOPY  2004, 2009, 2014  . FINGER SURGERY    . GUM SURGERY    . GYNECOLOGIC CRYOSURGERY    . HYSTEROSCOPY W/D&C  12/07/2010   with resection of endometrial polyp  . KNEE ARTHROSCOPY Left 2008  . lip biopsy     done at MD office Fri 11/22/13  . MASS  EXCISION Right 08/15/2013   Procedure: RIGHT INDEX EXCISION MASS ;  Surgeon: Tennis Must, MD;  Location: [place];  Service: Orthopedics;  Laterality: Right;  . NASAL SEPTUM SURGERY    . OOPHORECTOMY Right 2000  . SHOULDER ARTHROSCOPY WITH OPEN ROTATOR CUFF REPAIR AND DISTAL CLAVICLE ACROMINECTOMY Right 11/26/2013   Procedure: RIGHT SHOULDER ARTHROSCOPY WITH MINI OPEN ROTATOR CUFF REPAIR AND DISTAL CLAVICLE RESECTION, SUBACROMIAL DECOMPRESSION, POSSIBLE [facility] PATCH.;  Surgeon: [name], MD;  Location: [place];  Service: Orthopedics;  Laterality: Right;  . TOTAL KNEE ARTHROPLASTY Right 03/21/2017  . TOTAL KNEE ARTHROPLASTY Right 03/21/2017   Procedure: RIGHT TOTAL KNEE ARTHROPLASTY;  Surgeon: [name], MD;  Location: [place];  Service: Orthopedics;  Laterality: Right;  . TUBAL LIGATION      There were no vitals filed for this visit.  Subjective Assessment - 07/17/17 1441    Subjective  Patient reports increased pain medial and lateral aspect of right knee today.     Pertinent History  S/P R TKA on 03/21/2017. Pt had osteoarthritis and it was "bone on bone." Had 5 home health PT visits, and a little bit of PT  at the hospital. R knee hurts currently and feels tight.  PLOF: pt was using a SPC at times when her knee was acting up real bad but did not have to use her SPC all the time.      Limitations  Walking;Standing;House hold activities;Sitting    Patient Stated Goals  I just want to be able to get back to normal. (Has 5 dogs she has to take care of, do chores around the house, be active).     Currently in Pain?  Yes    Pain Score  4     Pain Location  Knee    Pain Orientation  Right;Medial;Lateral    Pain Descriptors / Indicators  Aching    Pain Type  Surgical pain 03/21/2017    Pain Onset  More than a month ago    Pain Frequency  Intermittent            Objective: AAROM right knee flexion 0-105 degrees pre treatment with stiffness/pain limiting  further motion Palpation: tenderness medial aspect right knee, spasms right quadriceps central aspect just proximal to incision scar. improved from previous session   Treatment:  Manual therapy: 15 min.: goal improve soft tissue elasticity, improve ROM right knee; improve gait pattern patella mobilization with patella mobilizer 3 sets focus on distraction and inferior glides with improved mobility STM quadriceps right LE just proximal to incision with patient supine lying with right LE supported   Therapeutic exercise: patient performed with demonstration, instruction, verbal and tactile cues of therapist: goal: independent with home program, improve LEFS, ambulation without AD    Supine: hip and knee flexion/extension 3 x 5 reps with isometric hold knee flexion x 5 reps each set hook lying marching with band around thighs 3 x 5 reps each LE hip abduction band around thighs 3 x 5 reps  side lying:  clam with band x 10, without band x 10 running man with band around thighs x 10, without band x 10 hip abduction with band aound thighs x 5, without band x 5 reps  sitting: knee flexion with green resistive band x 15 reps   Ice pack to right knee at end of session: pain control; no adverse reaction noted    Patient response to treatment: patient with improved ROM up to 110 degrees flexion following STM/exercise supine. Patient required moderate assistance and cuing to perform exercises with appropriate intensity and with good technique      PT Education - 07/17/17 1520    Education provided  Yes    Education Details  exercise insruction    Person(s) Educated  Patient    Methods  Explanation;Demonstration;Verbal cues; handout   Comprehension  Verbal cues required;Returned demonstration;Verbalized understanding          PT Long Term Goals - 07/05/17 1200      PT LONG TERM GOAL #1   Title  Patient will be able to ambulate at least 500 ft without use of AD and no LOB to promote  mobility.     Baseline  Pt currently ambulates with rw (04/12/2017); patient ambulating with SPC with pain right and left LE knees and is using rolling walker as needed (05/29/17); 07/05/17 using cane for support    Status  Revised    Target Date  07/26/17      PT LONG TERM GOAL #2   Title  Pt will improve seated R knee extension AROM to -8 degrees or less to promote ability to ambulate, perform standing  tasks.     Baseline  seated R knee extension AROM -25 degrees (04/12/2017)    Status  Achieved      PT LONG TERM GOAL #3   Title  Pt will improve seated R knee flexion AROM to at least 115 degrees to promote ability to ambulate, negiotiate steps, perform transfers with less difficulty.     Baseline  seated R knee flexion AROM 77 degrees (04/12/2017); AAROM right knee flexion 0-  up to 100 degrees with pain limiting further motion (05/29/17)    Status  Revised    Target Date  08/16/17      PT LONG TERM GOAL #4   Title  Pt will improve R knee flexion strength to at least 4+/5 and R knee extension strength to at least 5/5 to promote ability to ambulate, perform chores.     Baseline  right knee flexion 4-/5, knee extension improving 4-/5: (05/29/17): knee extension 4/5 with pain, knee flexion 4/5 with pain    Status  Revised    Target Date  08/16/17      PT LONG TERM GOAL #5   Title  Patient will improve her LEFS score to 50 or more points as a demonstration of improved function with daily tasks.    Baseline  15/80 (04/12/2017); LEFS 05/29/17 30/80; LEFS 07/05/17 27/80    Status  Revised    Target Date  08/16/17            Plan - 07/17/17 1536    Clinical Impression Statement  Patient is progressing steadily towards goals. She continues with decresaed strength, right knee pain and stiffness and will benefit from continued physical therapy intervention.     Rehab Potential  Fair    Clinical Impairments Affecting Rehab Potential  (-) healing time, pain; (+) husband support, acute condition,  motivated    PT Frequency  2x / week    PT Duration  6 weeks    PT Treatment/Interventions  Neuromuscular re-education;Therapeutic exercise;Therapeutic activities;Manual techniques;Aquatic Therapy;Electrical Stimulation;Iontophoresis 70m/ml Dexamethasone;Gait training;Stair training;Balance training;Functional mobility training;Patient/family education;Scar mobilization;Passive range of motion;Dry needling    PT Next Visit Plan  ROM, gait, function, manual techniques, modalities PRN    PT Home Exercise Plan  ROM, standing hip abduction/extension; quad sets seated, knee flexion with resistive band; elevation/ use of ice for swelling/pain       Patient will benefit from skilled therapeutic intervention in order to improve the following deficits and impairments:  Pain, Difficulty walking, Decreased strength, Decreased scar mobility, Decreased range of motion  Visit Diagnosis: Acute pain of right knee  Muscle weakness (generalized)  Stiffness of right knee, not elsewhere classified  Difficulty in walking, not elsewhere classified     Problem List Patient Active Problem List   Diagnosis Date Noted  . S/P TKR (total knee replacement) using cement, right 03/21/2017  . Primary osteoarthritis of right knee 11/02/2016  . Nausea without vomiting 09/01/2016  . Chronic diarrhea 12/07/2014  . Segmental colitis with rectal bleeding ([place] 09/12/2014  . Routine general medical examination at a health care facility 06/05/2014  . Overweight 06/13/2011  . ABNORMAL CHEST XRAY 09/30/2007  . Hyperlipidemia 02/17/2007  . Essential hypertension 01/08/2007  . Osteoarthritis 01/08/2007    BJomarie LongsPT 07/18/2017, 9:37 PM  [facility] [address] [place] [address]Phone: [phone]  Fax:  [phone] Name: NCATHELEEN LANGHORNEMRN: [id number] of Birth: 3May 20, 1952

## 2017-07-19 ENCOUNTER — Ambulatory Visit: Payer: Medicare Other | Admitting: Physical Therapy

## 2017-07-19 ENCOUNTER — Encounter: Payer: Self-pay | Admitting: Physical Therapy

## 2017-07-19 DIAGNOSIS — M25561 Pain in right knee: Secondary | ICD-10-CM

## 2017-07-19 DIAGNOSIS — M6281 Muscle weakness (generalized): Secondary | ICD-10-CM

## 2017-07-19 DIAGNOSIS — R262 Difficulty in walking, not elsewhere classified: Secondary | ICD-10-CM | POA: Diagnosis not present

## 2017-07-19 DIAGNOSIS — M25661 Stiffness of right knee, not elsewhere classified: Secondary | ICD-10-CM

## 2017-07-19 NOTE — Therapy (Signed)
[facility] [address] [place], [address] Phone: [phone]   Fax:  [phone]  Physical Therapy Treatment  Patient Details  Name: [name] MRN: [id number] Date of Birth: 1950/12/18 Referring Provider: Joni Fears, MD   Encounter Date: 07/19/2017  PT End of Session - 07/19/17 1553    Visit Number  29    Number of Visits  36    Date for PT Re-Evaluation  08/16/17    PT Start Time  1057    PT Stop Time  1143    PT Time Calculation (min)  46 min    Equipment Utilized During Treatment  Other (comment) pt hurry cane    Activity Tolerance  Patient tolerated treatment well    Behavior During Therapy  [facility] for tasks assessed/performed       Past Medical History:  Diagnosis Date  . Arthritis    back, fingers with joint pain and swelling.  chronic back pain  . Chronic back pain    arthritis   . Diverticulosis   . Elevated cholesterol    takes Niacin daily and Simvastatin  . GERD (gastroesophageal reflux disease) 06/2004   non-specific gastritis on EGD 06/2004  . Headache(784.0)    occasionally  . History of colon polyps 2004, 2009   2004:adenomatous. 2009 hyperplastic.   Marland Kitchen History of hiatal hernia   . Hypertension    takes Tenoretic and Lisinopril daily  . Mucoid cyst of joint 08/2013   right index finger  . Osteopenia 01/2014   T score -1.1 FRAX 14%/0.5%. Stable from prior DEXA  . PONV (postoperative nausea and vomiting)   . Seasonal allergies   . Stroke ([place]) 1998   x 2 - mild left-sided weakness  . Urge incontinence   . Uterine prolapse     Past Surgical History:  Procedure Laterality Date  . BACK SURGERY     x 2  . COLONOSCOPY  2004, 2009, 2014  . FINGER SURGERY    . GUM SURGERY    . GYNECOLOGIC CRYOSURGERY    . HYSTEROSCOPY W/D&C  12/07/2010   with resection of endometrial polyp  . KNEE ARTHROSCOPY Left 2008  . lip biopsy     done at MD office Fri 11/22/13  . MASS  EXCISION Right 08/15/2013   Procedure: RIGHT INDEX EXCISION MASS ;  Surgeon: Tennis Must, MD;  Location: [place];  Service: Orthopedics;  Laterality: Right;  . NASAL SEPTUM SURGERY    . OOPHORECTOMY Right 2000  . SHOULDER ARTHROSCOPY WITH OPEN ROTATOR CUFF REPAIR AND DISTAL CLAVICLE ACROMINECTOMY Right 11/26/2013   Procedure: RIGHT SHOULDER ARTHROSCOPY WITH MINI OPEN ROTATOR CUFF REPAIR AND DISTAL CLAVICLE RESECTION, SUBACROMIAL DECOMPRESSION, POSSIBLE [facility] PATCH.;  Surgeon: [name], MD;  Location: [place];  Service: Orthopedics;  Laterality: Right;  . TOTAL KNEE ARTHROPLASTY Right 03/21/2017  . TOTAL KNEE ARTHROPLASTY Right 03/21/2017   Procedure: RIGHT TOTAL KNEE ARTHROPLASTY;  Surgeon: [name], MD;  Location: [place];  Service: Orthopedics;  Laterality: Right;  . TUBAL LIGATION      There were no vitals filed for this visit.  Subjective Assessment - 07/19/17 1102    Subjective  Patient reports less pain in her knee with her not being up so much and exercising less intensily and doing the modified exercises lying down.     Pertinent History  S/P R TKA on 03/21/2017. Pt had osteoarthritis and it was "bone on  bone." Had 5 home health PT visits, and a little bit of PT at the hospital. R knee hurts currently and feels tight.  PLOF: pt was using a SPC at times when her knee was acting up real bad but did not have to use her SPC all the time.      Limitations  Walking;Standing;House hold activities;Sitting    Patient Stated Goals  I just want to be able to get back to normal. (Has 5 dogs she has to take care of, do chores around the house, be active).     Currently in Pain?  Yes    Pain Score  2     Pain Location  Knee    Pain Orientation  Right;Medial    Pain Descriptors / Indicators  Aching    Pain Type  Surgical pain 03/21/2017    Pain Onset  More than a month ago    Pain Frequency  Intermittent           Objective: AAROM right knee flexion  0-105 and up to 110 degrees  post treatment with stiffness/pain limiting further motion Palpation: tenderness medial aspect right knee, spasms right quadriceps central aspect just proximal to incision scar. improved from previous session   Treatment:  Manual therapy: 10 min.: goal improve soft tissue elasticity, improve ROM right knee; improve gait pattern patella mobilization with patella mobilizer 3 sets focus on distraction and inferior glides with improved mobility STM quadriceps right LE just proximal to incision with patient sitting with right LE supported   Therapeutic exercise: patient performed with demonstration, instruction, verbal and tactile cues of therapist: goal: independent with home program, improve LEFS, ambulation without AD    Supine: hook lying marching with band around thighs 2 x 10 reps each LE hip abduction band around thighs 3 x 5 reps each LE   side lying:  clam 2 x 10 with manual resistance given running man 3 x 5 reps with assistance for un weighting LE  hip abduction with assistance of therapist for controlled motion, good alignment of hip and knee 3 x 5 reps   sitting: knee flexion with green resistive band proximal calf x 5 reps with decreased strain on knee reported Isometric knee flexion with manual resistance end range to facilitate increased knee flexion to 110 degrees   Patient response to treatment: improved technique and able to perform exercises with assistance and minimal cuing. pain in right knee decreased with repetition with side lying exercises     PT Education - 07/19/17 1200    Education provided  Yes    Education Details  exercise instruction    Person(s) Educated  Patient    Methods  Explanation;Verbal cues    Comprehension  Verbalized understanding;Verbal cues required          PT Long Term Goals - 07/05/17 1200      PT LONG TERM GOAL #1   Title  Patient will be able to ambulate at least 500 ft without use of AD and no LOB  to promote mobility.     Baseline  Pt currently ambulates with rw (04/12/2017); patient ambulating with SPC with pain right and left LE knees and is using rolling walker as needed (05/29/17); 07/05/17 using cane for support    Status  Revised    Target Date  07/26/17      PT LONG TERM GOAL #2   Title  Pt will improve seated R knee extension AROM to -8 degrees or less  to promote ability to ambulate, perform standing tasks.     Baseline  seated R knee extension AROM -25 degrees (04/12/2017)    Status  Achieved      PT LONG TERM GOAL #3   Title  Pt will improve seated R knee flexion AROM to at least 115 degrees to promote ability to ambulate, negiotiate steps, perform transfers with less difficulty.     Baseline  seated R knee flexion AROM 77 degrees (04/12/2017); AAROM right knee flexion 0-  up to 100 degrees with pain limiting further motion (05/29/17)    Status  Revised    Target Date  08/16/17      PT LONG TERM GOAL #4   Title  Pt will improve R knee flexion strength to at least 4+/5 and R knee extension strength to at least 5/5 to promote ability to ambulate, perform chores.     Baseline  right knee flexion 4-/5, knee extension improving 4-/5: (05/29/17): knee extension 4/5 with pain, knee flexion 4/5 with pain    Status  Revised    Target Date  08/16/17      PT LONG TERM GOAL #5   Title  Patient will improve her LEFS score to 50 or more points as a demonstration of improved function with daily tasks.    Baseline  15/80 (04/12/2017); LEFS 05/29/17 30/80; LEFS 07/05/17 27/80    Status  Revised    Target Date  08/16/17            Plan - 07/19/17 1555    Clinical Impression Statement  Patient demonstrates good progress towards goals with improving ROM and strength right LE. She is responding well to modified exercises and should contiinue to improve with additional physical therapy intervention.     Rehab Potential  Fair    Clinical Impairments Affecting Rehab Potential  (-) healing time,  pain; (+) husband support, acute condition, motivated    PT Frequency  2x / week    PT Duration  6 weeks    PT Treatment/Interventions  Neuromuscular re-education;Therapeutic exercise;Therapeutic activities;Manual techniques;Aquatic Therapy;Electrical Stimulation;Iontophoresis 86m/ml Dexamethasone;Gait training;Stair training;Balance training;Functional mobility training;Patient/family education;Scar mobilization;Passive range of motion;Dry needling    PT Next Visit Plan  ROM, gait, function, manual techniques, modalities PRN    PT Home Exercise Plan  ROM, standing hip abduction/extension; quad sets seated, knee flexion with resistive band; elevation/ use of ice for swelling/pain       Patient will benefit from skilled therapeutic intervention in order to improve the following deficits and impairments:  Pain, Difficulty walking, Decreased strength, Decreased scar mobility, Decreased range of motion  Visit Diagnosis: Acute pain of right knee  Muscle weakness (generalized)  Stiffness of right knee, not elsewhere classified  Difficulty in walking, not elsewhere classified     Problem List Patient Active Problem List   Diagnosis Date Noted  . S/P TKR (total knee replacement) using cement, right 03/21/2017  . Primary osteoarthritis of right knee 11/02/2016  . Nausea without vomiting 09/01/2016  . Chronic diarrhea 12/07/2014  . Segmental colitis with rectal bleeding ([place] 09/12/2014  . Routine general medical examination at a health care facility 06/05/2014  . Overweight 06/13/2011  . ABNORMAL CHEST XRAY 09/30/2007  . Hyperlipidemia 02/17/2007  . Essential hypertension 01/08/2007  . Osteoarthritis 01/08/2007    BJomarie LongsPT 07/19/2017, 3:57 PM  [facility] [address] [place] [address]Phone: [phone]  Fax:  [phone] Name:  [name] MRN: [id number] Date of Birth: 1951-04-05

## 2017-07-21 ENCOUNTER — Encounter (INDEPENDENT_AMBULATORY_CARE_PROVIDER_SITE_OTHER): Payer: Self-pay | Admitting: Orthopaedic Surgery

## 2017-07-21 ENCOUNTER — Ambulatory Visit (INDEPENDENT_AMBULATORY_CARE_PROVIDER_SITE_OTHER): Payer: Medicare Other | Admitting: Orthopaedic Surgery

## 2017-07-21 VITALS — BP 118/87 | HR 99 | Resp 16 | Ht 62.0 in | Wt 180.0 lb

## 2017-07-21 DIAGNOSIS — Z96651 Presence of right artificial knee joint: Secondary | ICD-10-CM | POA: Diagnosis not present

## 2017-07-21 MED ORDER — CEPHALEXIN 500 MG PO CAPS: ORAL_CAPSULE | ORAL | 0 refills | Status: DC

## 2017-07-21 NOTE — Progress Notes (Signed)
Office Visit Note   Patient: [name]           Date of Birth: [birth year]           MRN: [id number] Visit Date: 07/21/2017              Requested by: Hoyt Koch, MD [place], [place] [address] PCP: Hoyt Koch, MD   Assessment & Plan: Visit Diagnoses:  1. History of total right knee replacement     Plan: 4 months status post primary right total knee replacement and definitely making progress.  Finishing a course of physical therapy.  Will work out at [employer] or gym.  Uses a cane.  Feeling much stronger.  Will this in 3 months.  Continuing strengthening exercises left total knee replacement later in the year.  Needs greater strength to both lower extremities Follow-Up Instructions: Return in about 3 months (around 10/20/2017).   Orders:  No orders of the defined types were placed in this encounter.  Meds ordered this encounter  Medications  . cephALEXin (KEFLEX) 500 MG capsule    Sig: TAKE 4 CAPS 1 HOUR PRIOR TO PROCEDURE    Dispense:  4 capsule    Refill:  0      Procedures: No procedures performed   Clinical Data: No additional findings.   Subjective: Chief Complaint  Patient presents with  . Right Knee - Routine Post Op  . Post-op Follow-up    03/21/18 R KNEE SURGERY F/U DOING PT, HAVING TROUBLE BENDING, STIFFNESS WITH SOME SWELLING  Has been receiving physical therapy with improvement in the strength and use of her right lower extremity.  Still uses a cane.  Not having much pain.  No numbness or tingling.  Definitely stronger getting up and down from a sitting position  HPI  Review of Systems  Constitutional: Negative for fatigue.  HENT: Negative for ear pain.   Eyes: Negative for pain.  Respiratory: Negative for cough and choking.   Cardiovascular: Negative for leg swelling.  Gastrointestinal: Negative for constipation and diarrhea.  Genitourinary: Negative for difficulty urinating.  Musculoskeletal: Negative for  back pain and neck pain.  Skin: Negative for rash.  Allergic/Immunologic: Positive for food allergies.  Neurological: Negative for weakness and numbness.  Hematological: Bruises/bleeds easily.  Psychiatric/Behavioral: Negative for sleep disturbance.     Objective: Vital Signs: BP 118/87 (BP Location: Left Arm, Patient Position: Sitting, Cuff Size: Normal)   Pulse 99   Resp 16   Ht 5' 2"  (1.575 m)   Wt 180 lb (81.6 kg)   LMP 02/09/2005   BMI 32.92 kg/m   Physical Exam  Ortho Exam awake alert and oriented x3.  Comfortable sitting.  Full quick extension of her right knee.  No instability.  No effusion right knee.  Definitely has better quad strength and tone.  No calf pain.  Neurovascular exam intact.  Therapist notes that she has had 110 degrees of flexion.  Straight leg raise negative.  No pain with range of motion of right  Specialty Comments:  No specialty comments available.  Imaging: No results found.   PMFS History: Patient Active Problem List   Diagnosis Date Noted  . S/P TKR (total knee replacement) using cement, right 03/21/2017  . Primary osteoarthritis of right knee 11/02/2016  . Nausea without vomiting 09/01/2016  . Chronic diarrhea 12/07/2014  . Segmental colitis with rectal bleeding ([place]) 09/12/2014  . Routine general medical examination at a health care facility  06/05/2014  . Overweight 06/13/2011  . ABNORMAL CHEST XRAY 09/30/2007  . Hyperlipidemia 02/17/2007  . Essential hypertension 01/08/2007  . Osteoarthritis 01/08/2007   Past Medical History:  Diagnosis Date  . Arthritis    back, fingers with joint pain and swelling.  chronic back pain  . Chronic back pain    arthritis   . Diverticulosis   . Elevated cholesterol    takes Niacin daily and Simvastatin  . GERD (gastroesophageal reflux disease) 06/2004   non-specific gastritis on EGD 06/2004  . Headache(784.0)    occasionally  . History of colon polyps 2004, 2009   2004:adenomatous. 2009  hyperplastic.   Marland Kitchen History of hiatal hernia   . Hypertension    takes Tenoretic and Lisinopril daily  . Mucoid cyst of joint 08/2013   right index finger  . Osteopenia 01/2014   T score -1.1 FRAX 14%/0.5%. Stable from prior DEXA  . PONV (postoperative nausea and vomiting)   . Seasonal allergies   . Stroke ([place]) 1998   x 2 - mild left-sided weakness  . Urge incontinence   . Uterine prolapse     Family History  Problem Relation Age of Onset  . Hypertension Mother   . Heart disease Mother   . Diabetes Father   . Hypertension Father   . Heart disease Father   . Stroke Father   . Diabetes Maternal Aunt   . Diabetes Paternal Grandmother   . Hypertension Paternal Grandfather   . Stroke Paternal Grandfather     Past Surgical History:  Procedure Laterality Date  . BACK SURGERY     x 2  . COLONOSCOPY  2004, 2009, 2014  . FINGER SURGERY    . GUM SURGERY    . GYNECOLOGIC CRYOSURGERY    . HYSTEROSCOPY W/D&C  12/07/2010   with resection of endometrial polyp  . KNEE ARTHROSCOPY Left 2008  . lip biopsy     done at MD office Fri 11/22/13  . MASS EXCISION Right 08/15/2013   Procedure: RIGHT INDEX EXCISION MASS ;  Surgeon: Tennis Must, MD;  Location: [place];  Service: Orthopedics;  Laterality: Right;  . NASAL SEPTUM SURGERY    . OOPHORECTOMY Right 2000  . SHOULDER ARTHROSCOPY WITH OPEN ROTATOR CUFF REPAIR AND DISTAL CLAVICLE ACROMINECTOMY Right 11/26/2013   Procedure: RIGHT SHOULDER ARTHROSCOPY WITH MINI OPEN ROTATOR CUFF REPAIR AND DISTAL CLAVICLE RESECTION, SUBACROMIAL DECOMPRESSION, POSSIBLE [facility] PATCH.;  Surgeon: [name], MD;  Location: [place];  Service: Orthopedics;  Laterality: Right;  . TOTAL KNEE ARTHROPLASTY Right 03/21/2017  . TOTAL KNEE ARTHROPLASTY Right 03/21/2017   Procedure: RIGHT TOTAL KNEE ARTHROPLASTY;  Surgeon: [name], MD;  Location: [place];  Service: Orthopedics;  Laterality: Right;  . TUBAL LIGATION     Social History    Occupational History  . Occupation: Animal nutritionist    Comment: Disability/Retired  Tobacco Use  . Smoking status: Passive Smoke Exposure - Never Smoker  . Smokeless tobacco: Never Used  Substance and Sexual Activity  . Alcohol use: No    Alcohol/week: 0.0 oz    Frequency: Never  . Drug use: No  . Sexual activity: Yes    Partners: Male    Birth control/protection: Surgical, Post-menopausal    Comment: BTL-1st intercourse 67 yo-More than 5 partners

## 2017-07-24 ENCOUNTER — Ambulatory Visit: Payer: Medicare Other | Admitting: Physical Therapy

## 2017-07-24 ENCOUNTER — Encounter: Payer: Self-pay | Admitting: Physical Therapy

## 2017-07-24 DIAGNOSIS — R262 Difficulty in walking, not elsewhere classified: Secondary | ICD-10-CM

## 2017-07-24 DIAGNOSIS — M25561 Pain in right knee: Secondary | ICD-10-CM | POA: Diagnosis not present

## 2017-07-24 DIAGNOSIS — M6281 Muscle weakness (generalized): Secondary | ICD-10-CM | POA: Diagnosis not present

## 2017-07-24 DIAGNOSIS — M25661 Stiffness of right knee, not elsewhere classified: Secondary | ICD-10-CM | POA: Diagnosis not present

## 2017-07-25 NOTE — Therapy (Signed)
[facility] [address] [place], [address] Phone: [phone]   Fax:  [phone]  Physical Therapy Treatment/ Progress Note  Patient Details  Name: [name] MRN: [id number] Date of Birth: Apr 16, 1950 Referring Provider: Joni Fears, MD   Encounter Date: 07/24/2017   Dates of reporting period 06/22/2017   to   07/24/2017   PT End of Session - 07/24/17 1424    Visit Number  30    Number of Visits  36    Date for PT Re-Evaluation  08/16/17    PT Start Time  1416    PT Stop Time  1506    PT Time Calculation (min)  50 min    Equipment Utilized During Treatment  Other (comment) pt hurry cane    Activity Tolerance  Patient tolerated treatment well    Behavior During Therapy  [facility] for tasks assessed/performed       Past Medical History:  Diagnosis Date  . Arthritis    back, fingers with joint pain and swelling.  chronic back pain  . Chronic back pain    arthritis   . Diverticulosis   . Elevated cholesterol    takes Niacin daily and Simvastatin  . GERD (gastroesophageal reflux disease) 06/2004   non-specific gastritis on EGD 06/2004  . Headache(784.0)    occasionally  . History of colon polyps 2004, 2009   2004:adenomatous. 2009 hyperplastic.   Marland Kitchen History of hiatal hernia   . Hypertension    takes Tenoretic and Lisinopril daily  . Mucoid cyst of joint 08/2013   right index finger  . Osteopenia 01/2014   T score -1.1 FRAX 14%/0.5%. Stable from prior DEXA  . PONV (postoperative nausea and vomiting)   . Seasonal allergies   . Stroke ([place]) 1998   x 2 - mild left-sided weakness  . Urge incontinence   . Uterine prolapse     Past Surgical History:  Procedure Laterality Date  . BACK SURGERY     x 2  . COLONOSCOPY  2004, 2009, 2014  . FINGER SURGERY    . GUM SURGERY    . GYNECOLOGIC CRYOSURGERY    . HYSTEROSCOPY W/D&C  12/07/2010   with resection of endometrial polyp  . KNEE ARTHROSCOPY  Left 2008  . lip biopsy     done at MD office Fri 11/22/13  . MASS EXCISION Right 08/15/2013   Procedure: RIGHT INDEX EXCISION MASS ;  Surgeon: Tennis Must, MD;  Location: [facility];  Service: Orthopedics;  Laterality: Right;  . NASAL SEPTUM SURGERY    . OOPHORECTOMY Right 2000  . SHOULDER ARTHROSCOPY WITH OPEN ROTATOR CUFF REPAIR AND DISTAL CLAVICLE ACROMINECTOMY Right 11/26/2013   Procedure: RIGHT SHOULDER ARTHROSCOPY WITH MINI OPEN ROTATOR CUFF REPAIR AND DISTAL CLAVICLE RESECTION, SUBACROMIAL DECOMPRESSION, POSSIBLE [facility] PATCH.;  Surgeon: [name], MD;  Location: [facility];  Service: Orthopedics;  Laterality: Right;  . TOTAL KNEE ARTHROPLASTY Right 03/21/2017  . TOTAL KNEE ARTHROPLASTY Right 03/21/2017   Procedure: RIGHT TOTAL KNEE ARTHROPLASTY;  Surgeon: [name], MD;  Location: [facility];  Service: Orthopedics;  Laterality: Right;  . TUBAL LIGATION      There were no vitals filed for this visit.  Subjective Assessment - 07/24/17 1422    Subjective  Patient was seen by MD and got a good report and is to continue with exercises and physical therapy     Pertinent History  S/P R TKA  on 03/21/2017. Pt had osteoarthritis and it was "bone on bone." Had 5 home health PT visits, and a little bit of PT at the hospital. R knee hurts currently and feels tight.  PLOF: pt was using a SPC at times when her knee was acting up real bad but did not have to use her SPC all the time.      Limitations  Walking;Standing;House hold activities;Sitting    Patient Stated Goals  I just want to be able to get back to normal. (Has 5 dogs she has to take care of, do chores around the house, be active).     Currently in Pain?  Yes    Pain Score  2     Pain Location  Knee    Pain Orientation  Right;Medial    Pain Descriptors / Indicators  Aching    Pain Type  Surgical pain 03/21/2017    Pain Onset  More than a month ago    Pain Frequency  Intermittent         Objective: AAROM  right knee flexion 0-105 and up to 110 degrees  post treatment Palpation: tenderness medial aspect right knee, mild spasms right quadriceps central aspect, increased thickness in soft tissue just inferior to patella LEFS 36/80  Gait: ambulating independently with cane for support due to left knee pain and decreased strength right LE Strength: right LE hip abduction 4-/5, hip extension 4-/5, knee extension with pain 4/5, knee flexion 4/5   Treatment:  Manual therapy: 10 min.: goal improve soft tissue elasticity, improve ROM right knee patella mobilization with patella mobilizer with patient supine x 3 sets distraction and inferior glides; improved mobility inferiorly following mobilization STM quadriceps right LE just proximal to incision and inferior to patella with patient sitting with right LE supported; improved knee AAROM to 108 degrees flexion   Therapeutic exercise: patient performed with demonstration, instruction, verbal and tactile cues of therapist: goal: independent with home program, improve LEFS, ambulation without AD    Supine: hook lying marching with band around thighs 2 x 10 reps each LE hip abduction band around thighs 3 x 5 reps each LE Isometric hip abduction end range with manual resistance given x 10 reps SAQ short arc 5# ankle weight 2 x 15 reps   Standing: Step up and over on balance stones x 10 reps   sitting: knee flexion with green resistive band proximal calf x 5 reps with decreased strain on knee reported Isometric knee flexion with manual resistance end range to facilitate knee flexion; improved up to 110 degrees   Patient response to treatment: improved soft tissue elasticity with STM and improved ROM up to 110 degrees flexion following treatment. patient required repeated demonstration and minimal cuing for improved technique with all exercises.      PT Education - 07/24/17 1423    Education provided  Yes    Education Details  exercise instruction; re  assessment, LEFS reviewed    Person(s) Educated  Patient    Methods  Explanation;Verbal cues    Comprehension  Verbalized understanding;Verbal cues required          PT Long Term Goals - 07/24/17 1525      PT LONG TERM GOAL #1   Title  Patient will be able to ambulate at least 500 ft without use of AD and no LOB to promote mobility.     Baseline  Pt currently ambulates with rw (04/12/2017); patient ambulating with SPC with pain right and left  LE knees and is using rolling walker as needed (05/29/17); 07/05/17 using c;ane for support; 07/24/2017 continues with cane for safety due to left knee pain    Status  Partially Met      PT LONG TERM GOAL #2   Title  Pt will improve seated R knee extension AROM to -8 degrees or less to promote ability to ambulate, perform standing tasks.     Baseline  seated R knee extension AROM -25 degrees (04/12/2017)    Status  Achieved      PT LONG TERM GOAL #3   Title  Pt will improve seated R knee flexion AROM to at least 115 degrees to promote ability to ambulate, negiotiate steps, perform transfers with less difficulty.     Baseline  seated R knee flexion AROM 77 degrees (04/12/2017); AAROM right knee flexion 0-  up to 100 degrees with pain limiting further motion (05/29/17); AROM up to 105 degrees/ AAROM 110     Status  On-going    Target Date  08/16/17      PT LONG TERM GOAL #4   Title  Pt will improve R knee flexion strength to at least 4+/5 and R knee extension strength to at least 5/5 to promote ability to ambulate, perform chores.     Baseline  right knee flexion 4-/5, knee extension improving 4-/5: (05/29/17): knee extension 4/5 with pain, knee flexion 4/5 with pain    Status  On-going    Target Date  08/16/17      PT LONG TERM GOAL #5   Title  Patient will improve her LEFS score to 50 or more points as a demonstration of improved function with daily tasks.    Baseline  15/80 (04/12/2017); LEFS 05/29/17 30/80; LEFS 07/05/17 27/80; 07/24/17 36/80    Status   On-going    Target Date  08/16/17            Plan - 07/24/17 1502    Clinical Impression Statement  Patient demonstrates good progress towards goals with improving ROM and strength right LE. Her LEFS score of  36/80 indicated moderate self perceived disability, she continues with pain, decreased ROM and strength right LE that limit full function without difficulty and she will benefit from continued physical therapy intervention to achieve goals.    Rehab Potential  Fair    Clinical Impairments Affecting Rehab Potential  (-) healing time, pain; (+) husband support, acute condition, motivated    PT Frequency  2x / week    PT Duration  6 weeks    PT Treatment/Interventions  Neuromuscular re-education;Therapeutic exercise;Therapeutic activities;Manual techniques;Aquatic Therapy;Electrical Stimulation;Iontophoresis 51m/ml Dexamethasone;Gait training;Stair training;Balance training;Functional mobility training;Patient/family education;Scar mobilization;Passive range of motion;Dry needling    PT Next Visit Plan  ROM, gait, function, manual techniques, modalities PRN    PT Home Exercise Plan  ROM, standing hip abduction/extension; quad sets seated, knee flexion with resistive band; elevation/ use of ice for swelling/pain       Patient will benefit from skilled therapeutic intervention in order to improve the following deficits and impairments:  Pain, Difficulty walking, Decreased strength, Decreased scar mobility, Decreased range of motion  Visit Diagnosis: Acute pain of right knee  Muscle weakness (generalized)  Stiffness of right knee, not elsewhere classified  Difficulty in walking, not elsewhere classified     Problem List Patient Active Problem List   Diagnosis Date Noted  . S/P TKR (total knee replacement) using cement, right 03/21/2017  . Primary osteoarthritis of right knee 11/02/2016  .  Nausea without vomiting 09/01/2016  . Chronic diarrhea 12/07/2014  . Segmental colitis  with rectal bleeding ([place]) 09/12/2014  . Routine general medical examination at a health care facility 06/05/2014  . Overweight 06/13/2011  . ABNORMAL CHEST XRAY 09/30/2007  . Hyperlipidemia 02/17/2007  . Essential hypertension 01/08/2007  . Osteoarthritis 01/08/2007    Jomarie Longs PT 07/25/2017, 7:28 AM  [facility] [address], [place], [address] Phone: [phone]   Fax:  [phone]  Name: [name] MRN: [id number] Date of Birth: 08/13/1950

## 2017-07-27 ENCOUNTER — Ambulatory Visit: Payer: Medicare Other | Admitting: Physical Therapy

## 2017-07-27 ENCOUNTER — Encounter: Payer: Self-pay | Admitting: Physical Therapy

## 2017-07-27 DIAGNOSIS — R262 Difficulty in walking, not elsewhere classified: Secondary | ICD-10-CM | POA: Diagnosis not present

## 2017-07-27 DIAGNOSIS — M25561 Pain in right knee: Secondary | ICD-10-CM

## 2017-07-27 DIAGNOSIS — M6281 Muscle weakness (generalized): Secondary | ICD-10-CM | POA: Diagnosis not present

## 2017-07-27 DIAGNOSIS — M25661 Stiffness of right knee, not elsewhere classified: Secondary | ICD-10-CM | POA: Diagnosis not present

## 2017-07-27 NOTE — Therapy (Signed)
[facility] [address] [place], [address] Phone: [phone]   Fax:  [phone]  Physical Therapy Treatment  Patient Details  Name: [name] MRN: [id number] Date of Birth: 1950-06-10 Referring Provider: Joni Fears, MD   Encounter Date: 07/27/2017  PT End of Session - 07/27/17 1434    Visit Number  31    Number of Visits  36    Date for PT Re-Evaluation  08/16/17    PT Start Time  1430    PT Stop Time  1512    PT Time Calculation (min)  42 min    Equipment Utilized During Treatment  Other (comment) pt hurry cane    Activity Tolerance  Patient tolerated treatment well    Behavior During Therapy  [facility] for tasks assessed/performed       Past Medical History:  Diagnosis Date  . Arthritis    back, fingers with joint pain and swelling.  chronic back pain  . Chronic back pain    arthritis   . Diverticulosis   . Elevated cholesterol    takes Niacin daily and Simvastatin  . GERD (gastroesophageal reflux disease) 06/2004   non-specific gastritis on EGD 06/2004  . Headache(784.0)    occasionally  . History of colon polyps 2004, 2009   2004:adenomatous. 2009 hyperplastic.   Marland Kitchen History of hiatal hernia   . Hypertension    takes Tenoretic and Lisinopril daily  . Mucoid cyst of joint 08/2013   right index finger  . Osteopenia 01/2014   T score -1.1 FRAX 14%/0.5%. Stable from prior DEXA  . PONV (postoperative nausea and vomiting)   . Seasonal allergies   . Stroke ([place]) 1998   x 2 - mild left-sided weakness  . Urge incontinence   . Uterine prolapse     Past Surgical History:  Procedure Laterality Date  . BACK SURGERY     x 2  . COLONOSCOPY  2004, 2009, 2014  . FINGER SURGERY    . GUM SURGERY    . GYNECOLOGIC CRYOSURGERY    . HYSTEROSCOPY W/D&C  12/07/2010   with resection of endometrial polyp  . KNEE ARTHROSCOPY Left 2008  . lip biopsy     done at MD office Fri 11/22/13  . MASS  EXCISION Right 08/15/2013   Procedure: RIGHT INDEX EXCISION MASS ;  Surgeon: Tennis Must, MD;  Location: [facility];  Service: Orthopedics;  Laterality: Right;  . NASAL SEPTUM SURGERY    . OOPHORECTOMY Right 2000  . SHOULDER ARTHROSCOPY WITH OPEN ROTATOR CUFF REPAIR AND DISTAL CLAVICLE ACROMINECTOMY Right 11/26/2013   Procedure: RIGHT SHOULDER ARTHROSCOPY WITH MINI OPEN ROTATOR CUFF REPAIR AND DISTAL CLAVICLE RESECTION, SUBACROMIAL DECOMPRESSION, POSSIBLE [facility] PATCH.;  Surgeon: [name], MD;  Location: [facility];  Service: Orthopedics;  Laterality: Right;  . TOTAL KNEE ARTHROPLASTY Right 03/21/2017  . TOTAL KNEE ARTHROPLASTY Right 03/21/2017   Procedure: RIGHT TOTAL KNEE ARTHROPLASTY;  Surgeon: [name], MD;  Location: [facility];  Service: Orthopedics;  Laterality: Right;  . TUBAL LIGATION      There were no vitals filed for this visit.  Subjective Assessment - 07/27/17 1436    Subjective  Patient reports her knee doesnt't feel as tight but it hurts a little more than usual.     Pertinent History  S/P R TKA on 03/21/2017. Pt had osteoarthritis and it was "bone on bone." Had 5 home health PT visits, and a  little bit of PT at the hospital. R knee hurts currently and feels tight.  PLOF: pt was using a SPC at times when her knee was acting up real bad but did not have to use her SPC all the time.      Limitations  Walking;Standing;House hold activities;Sitting    Patient Stated Goals  I just want to be able to get back to normal. (Has 5 dogs she has to take care of, do chores around the house, be active).     Currently in Pain?  Yes    Pain Score  2     Pain Location  Knee    Pain Orientation  Right;Medial    Pain Type  Surgical pain 03/21/2017    Pain Onset  More than a month ago    Pain Frequency  Intermittent       Objective: AAROM right knee flexion 0-105 and up to 110 degrees  post treatment  Palpation: tenderness medial aspect right knee, mild spasms  right quadriceps central aspect, increased thickness in soft tissue just inferior to patella   Treatment:  Manual therapy: 15 min.: goal improve soft tissue elasticity, improve ROM right knee patella mobilization with patella mobilizer with patient supine x 5 sets distraction and inferior glides; improved mobility inferiorly following mobilization STM quadriceps right LE just proximal to incision and inferior to patella with patient sitting with right LE supported   Therapeutic exercise: patient performed with demonstration, instruction, verbal and tactile cues of therapist: goal: independent with home program, improve LEFS, ambulation without AD    Supine: hook lying marching with band around thighs 2 x 15 reps each LE Isometric hip abduction end range with manual resistance given x 10 reps Hook lying clam with resistive band around thighs x 15 reps SAQ short arc 5# ankle weight 2 x 10reps    sitting: AAROM knee flexion right knee Isometric right knee flexion with manual resistance end range to facilitate knee flexion; improved up to 110 degrees   Patient response to treatment: improved AAROM to 110 degreees right knee following STM and resistive end range flexion of knee. improved technique with exercisses with minimal cuing.       PT Education - 07/27/17 1500    Education provided  Yes    Education Details  exercise instruction    Person(s) Educated  Patient    Methods  Explanation;Verbal cues;Demonstration    Comprehension  Verbalized understanding;Returned demonstration;Verbal cues required          PT Long Term Goals - 07/24/17 1525      PT LONG TERM GOAL #1   Title  Patient will be able to ambulate at least 500 ft without use of AD and no LOB to promote mobility.     Baseline  Pt currently ambulates with rw (04/12/2017); patient ambulating with SPC with pain right and left LE knees and is using rolling walker as needed (05/29/17); 07/05/17 using c;ane for support;  07/24/2017 continues with cane for safety due to left knee pain    Status  Partially Met      PT LONG TERM GOAL #2   Title  Pt will improve seated R knee extension AROM to -8 degrees or less to promote ability to ambulate, perform standing tasks.     Baseline  seated R knee extension AROM -25 degrees (04/12/2017)    Status  Achieved      PT LONG TERM GOAL #3   Title  Pt will improve  seated R knee flexion AROM to at least 115 degrees to promote ability to ambulate, negiotiate steps, perform transfers with less difficulty.     Baseline  seated R knee flexion AROM 77 degrees (04/12/2017); AAROM right knee flexion 0-  up to 100 degrees with pain limiting further motion (05/29/17); AROM up to 105 degrees/ AAROM 110     Status  On-going    Target Date  08/16/17      PT LONG TERM GOAL #4   Title  Pt will improve R knee flexion strength to at least 4+/5 and R knee extension strength to at least 5/5 to promote ability to ambulate, perform chores.     Baseline  right knee flexion 4-/5, knee extension improving 4-/5: (05/29/17): knee extension 4/5 with pain, knee flexion 4/5 with pain    Status  On-going    Target Date  08/16/17      PT LONG TERM GOAL #5   Title  Patient will improve her LEFS score to 50 or more points as a demonstration of improved function with daily tasks.    Baseline  15/80 (04/12/2017); LEFS 05/29/17 30/80; LEFS 07/05/17 27/80; 07/24/17 36/80    Status  On-going    Target Date  08/16/17            Plan - 07/27/17 1434    Clinical Impression Statement  Patient is progressing well with goals with improvement in ROM and strength. She continues with limitations with walking due to left knee pain, right knee pain and stiffness and decreased strength bilateral LE's. she should continue to improve with additional physical therapy intervention.     Rehab Potential  Fair    Clinical Impairments Affecting Rehab Potential  (-) healing time, pain; (+) husband support, acute condition,  motivated    PT Frequency  2x / week    PT Duration  6 weeks    PT Treatment/Interventions  Neuromuscular re-education;Therapeutic exercise;Therapeutic activities;Manual techniques;Aquatic Therapy;Electrical Stimulation;Iontophoresis 20m/ml Dexamethasone;Gait training;Stair training;Balance training;Functional mobility training;Patient/family education;Scar mobilization;Passive range of motion;Dry needling    PT Next Visit Plan  ROM, gait, function, manual techniques, modalities PRN    PT Home Exercise Plan  ROM, standing hip abduction/extension; quad sets seated, knee flexion with resistive band; elevation/ use of ice for swelling/pain       Patient will benefit from skilled therapeutic intervention in order to improve the following deficits and impairments:  Pain, Difficulty walking, Decreased strength, Decreased scar mobility, Decreased range of motion  Visit Diagnosis: Acute pain of right knee  Muscle weakness (generalized)  Stiffness of right knee, not elsewhere classified  Difficulty in walking, not elsewhere classified     Problem List Patient Active Problem List   Diagnosis Date Noted  . S/P TKR (total knee replacement) using cement, right 03/21/2017  . Primary osteoarthritis of right knee 11/02/2016  . Nausea without vomiting 09/01/2016  . Chronic diarrhea 12/07/2014  . [place] 09/12/2014  . Routine general medical examination at a health care facility 06/05/2014  . Overweight 06/13/2011  . ABNORMAL CHEST XRAY 09/30/2007  . Hyperlipidemia 02/17/2007  . Essential hypertension 01/08/2007  . Osteoarthritis 01/08/2007    BJomarie LongsPT 07/28/2017, 1:20 PM  [facility] [address] [place] [address]Phone: [phone]  Fax:  [phone] Name: Stacy COCKEMRN: [id number] of Birth: 330-Jun-1952

## 2017-07-29 ENCOUNTER — Other Ambulatory Visit: Payer: Self-pay | Admitting: Internal Medicine

## 2017-07-31 ENCOUNTER — Ambulatory Visit: Payer: Medicare Other | Admitting: Physical Therapy

## 2017-07-31 ENCOUNTER — Encounter: Payer: Self-pay | Admitting: Physical Therapy

## 2017-07-31 DIAGNOSIS — M25561 Pain in right knee: Secondary | ICD-10-CM

## 2017-07-31 DIAGNOSIS — M25661 Stiffness of right knee, not elsewhere classified: Secondary | ICD-10-CM

## 2017-07-31 DIAGNOSIS — R262 Difficulty in walking, not elsewhere classified: Secondary | ICD-10-CM | POA: Diagnosis not present

## 2017-07-31 DIAGNOSIS — M6281 Muscle weakness (generalized): Secondary | ICD-10-CM

## 2017-07-31 NOTE — Therapy (Signed)
[facility] [address], [place], [address] Phone: [phone]   Fax:  [phone]  Physical Therapy Treatment  Patient Details  Name: [name] MRN: [id number] Date of Birth: Apr 09, 1951 Referring Provider: Joni Fears, MD   Encounter Date: 07/31/2017  PT End of Session - 07/31/17 1436    Visit Number  32    Number of Visits  36    Date for PT Re-Evaluation  08/16/17    PT Start Time  3220    PT Stop Time  1513    PT Time Calculation (min)  41 min    Equipment Utilized During Treatment  Other (comment) pt hurry cane    Activity Tolerance  Patient tolerated treatment well    Behavior During Therapy  [facility] for tasks assessed/performed       Past Medical History:  Diagnosis Date  . Arthritis    back, fingers with joint pain and swelling.  chronic back pain  . Chronic back pain    arthritis   . Diverticulosis   . Elevated cholesterol    takes Niacin daily and Simvastatin  . GERD (gastroesophageal reflux disease) 06/2004   non-specific gastritis on EGD 06/2004  . Headache(784.0)    occasionally  . History of colon polyps 2004, 2009   2004:adenomatous. 2009 hyperplastic.   Marland Kitchen History of hiatal hernia   . Hypertension    takes Tenoretic and Lisinopril daily  . Mucoid cyst of joint 08/2013   right index finger  . Osteopenia 01/2014   T score -1.1 FRAX 14%/0.5%. Stable from prior DEXA  . PONV (postoperative nausea and vomiting)   . Seasonal allergies   . Stroke ([place]) 1998   x 2 - mild left-sided weakness  . Urge incontinence   . Uterine prolapse     Past Surgical History:  Procedure Laterality Date  . BACK SURGERY     x 2  . COLONOSCOPY  2004, 2009, 2014  . FINGER SURGERY    . GUM SURGERY    . GYNECOLOGIC CRYOSURGERY    . HYSTEROSCOPY W/D&C  12/07/2010   with resection of endometrial polyp  . KNEE ARTHROSCOPY Left 2008  . lip biopsy     done at MD office Fri 11/22/13  . MASS  EXCISION Right 08/15/2013   Procedure: RIGHT INDEX EXCISION MASS ;  Surgeon: Tennis Must, MD;  Location: [place];  Service: Orthopedics;  Laterality: Right;  . NASAL SEPTUM SURGERY    . OOPHORECTOMY Right 2000  . SHOULDER ARTHROSCOPY WITH OPEN ROTATOR CUFF REPAIR AND DISTAL CLAVICLE ACROMINECTOMY Right 11/26/2013   Procedure: RIGHT SHOULDER ARTHROSCOPY WITH MINI OPEN ROTATOR CUFF REPAIR AND DISTAL CLAVICLE RESECTION, SUBACROMIAL DECOMPRESSION, POSSIBLE [facility] PATCH.;  Surgeon: [name], MD;  Location: [place];  Service: Orthopedics;  Laterality: Right;  . TOTAL KNEE ARTHROPLASTY Right 03/21/2017  . TOTAL KNEE ARTHROPLASTY Right 03/21/2017   Procedure: RIGHT TOTAL KNEE ARTHROPLASTY;  Surgeon: [name], MD;  Location: [place];  Service: Orthopedics;  Laterality: Right;  . TUBAL LIGATION      There were no vitals filed for this visit.  Subjective Assessment - 07/31/17 1434    Subjective  Patient reports she is about the same with right knee with stiffness and difficulty walking. Her left knee is painful as well and limits her daily function.    Pertinent History  S/P R TKA on 03/21/2017. Pt had osteoarthritis and it was "bone  on bone." Had 5 home health PT visits, and a little bit of PT at the hospital. R knee hurts currently and feels tight.  PLOF: pt was using a SPC at times when her knee was acting up real bad but did not have to use her SPC all the time.      Limitations  Walking;Standing;House hold activities;Sitting    Patient Stated Goals  I just want to be able to get back to normal. (Has 5 dogs she has to take care of, do chores around the house, be active).     Currently in Pain?  Yes    Pain Score  4     Pain Location  Knee    Pain Orientation  Right;Medial    Pain Type  Surgical pain 03/21/2017    Pain Onset  More than a month ago    Pain Frequency  Intermittent         Objective: AAROM right knee flexion 0-105 and up to 110 degrees  post  treatment  Palpation: tenderness medial aspect right knee, mild spasms right quadriceps central aspect, increased thickness in soft tissue just inferior to patella   Treatment:  Manual therapy: 15 min.: goal improve soft tissue elasticity, improve ROM right knee patella mobilization with patella mobilizer with patient supine x 5 sets distraction and inferior glides STM quadriceps right LE just proximal to incision and inferior to patella with patient sitting with right LE supported with improved mobility 50% and improved knee flexion to 110   Therapeutic exercise: patient performed with demonstration, instruction, verbal and tactile cues of therapist: goal: independent with home program, improve LEFS, ambulation without AD    Supine: Isometric hip abduction end range with manual resistance given 2 x 15 reps Hook lying clam with manual resistance given 2 x 15 reps hook lying hip adduction with ball with glute sets, pelvic tilts x 10  Side lying left: right hip abduction with assistance of therapist 2 x 10 running man right LE with assist of therapist 2 x 10   Sitting: AAROM knee flexion right knee Isometric right knee flexion with manual resistance end range to facilitate knee flexion; improved up to 110 degrees  Standing: hip abduction and extension with green band around thighs with tapping balance stone x 10 each LE, each direction   Patient response to treatment: improved AAROM right knee flexion to 110 with minimal tightness/pulling anterior aspect of right knee following STM/exercises in sitting. required minimal cuing for correct technique with exercise.      PT Education - 07/31/17 1436    Education provided  Yes    Education Details  exercise instruction for technique    Person(s) Educated  Patient    Methods  Explanation;Demonstration;Verbal cues    Comprehension  Verbalized understanding;Returned demonstration;Verbal cues required          PT Long Term Goals -  07/24/17 1525      PT LONG TERM GOAL #1   Title  Patient will be able to ambulate at least 500 ft without use of AD and no LOB to promote mobility.     Baseline  Pt currently ambulates with rw (04/12/2017); patient ambulating with SPC with pain right and left LE knees and is using rolling walker as needed (05/29/17); 07/05/17 using c;ane for support; 07/24/2017 continues with cane for safety due to left knee pain    Status  Partially Met      PT LONG TERM GOAL #2   Title  Pt will improve seated R knee extension AROM to -8 degrees or less to promote ability to ambulate, perform standing tasks.     Baseline  seated R knee extension AROM -25 degrees (04/12/2017)    Status  Achieved      PT LONG TERM GOAL #3   Title  Pt will improve seated R knee flexion AROM to at least 115 degrees to promote ability to ambulate, negiotiate steps, perform transfers with less difficulty.     Baseline  seated R knee flexion AROM 77 degrees (04/12/2017); AAROM right knee flexion 0-  up to 100 degrees with pain limiting further motion (05/29/17); AROM up to 105 degrees/ AAROM 110     Status  On-going    Target Date  08/16/17      PT LONG TERM GOAL #4   Title  Pt will improve R knee flexion strength to at least 4+/5 and R knee extension strength to at least 5/5 to promote ability to ambulate, perform chores.     Baseline  right knee flexion 4-/5, knee extension improving 4-/5: (05/29/17): knee extension 4/5 with pain, knee flexion 4/5 with pain    Status  On-going    Target Date  08/16/17      PT LONG TERM GOAL #5   Title  Patient will improve her LEFS score to 50 or more points as a demonstration of improved function with daily tasks.    Baseline  15/80 (04/12/2017); LEFS 05/29/17 30/80; LEFS 07/05/17 27/80; 07/24/17 36/80    Status  On-going    Target Date  08/16/17            Plan - 07/31/17 1543    Clinical Impression Statement  Patient is progressing towards goals and improving strength and ROM right knee s/p TKA.  She continues with limting factors of pain and swelling right knee and pain in left knee that are limiting full pain free function with daily tasks. she should continue to improve with additional physical therapy intervention.     Rehab Potential  Fair    Clinical Impairments Affecting Rehab Potential  (-) healing time, pain; (+) husband support, acute condition, motivated    PT Frequency  2x / week    PT Duration  6 weeks    PT Treatment/Interventions  Neuromuscular re-education;Therapeutic exercise;Therapeutic activities;Manual techniques;Aquatic Therapy;Electrical Stimulation;Iontophoresis 15m/ml Dexamethasone;Gait training;Stair training;Balance training;Functional mobility training;Patient/family education;Scar mobilization;Passive range of motion;Dry needling    PT Next Visit Plan  ROM, gait, function, manual techniques, modalities PRN    PT Home Exercise Plan  ROM, standing hip abduction/extension; quad sets seated, knee flexion with resistive band; elevation/ use of ice for swelling/pain       Patient will benefit from skilled therapeutic intervention in order to improve the following deficits and impairments:  Pain, Difficulty walking, Decreased strength, Decreased scar mobility, Decreased range of motion  Visit Diagnosis: Acute pain of right knee  Muscle weakness (generalized)  Stiffness of right knee, not elsewhere classified  Difficulty in walking, not elsewhere classified     Problem List Patient Active Problem List   Diagnosis Date Noted  . S/P TKR (total knee replacement) using cement, right 03/21/2017  . Primary osteoarthritis of right knee 11/02/2016  . Nausea without vomiting 09/01/2016  . Chronic diarrhea 12/07/2014  . Segmental colitis with rectal bleeding ([place] 09/12/2014  . Routine general medical examination at a health care facility 06/05/2014  . Overweight 06/13/2011  . ABNORMAL CHEST XRAY 09/30/2007  . Hyperlipidemia 02/17/2007  .  Essential hypertension  01/08/2007  . Osteoarthritis 01/08/2007    Jomarie Longs PT 07/31/2017, 3:45 PM  [place] [address] [place], [address] Phone: [phone]   Fax:  [phone]  Name: [name] MRN: [id number] Date of Birth: [birth year]

## 2017-08-02 ENCOUNTER — Encounter: Payer: Self-pay | Admitting: Physical Therapy

## 2017-08-02 ENCOUNTER — Ambulatory Visit: Payer: Medicare Other | Admitting: Physical Therapy

## 2017-08-02 DIAGNOSIS — R262 Difficulty in walking, not elsewhere classified: Secondary | ICD-10-CM

## 2017-08-02 DIAGNOSIS — M25561 Pain in right knee: Secondary | ICD-10-CM

## 2017-08-02 DIAGNOSIS — M25661 Stiffness of right knee, not elsewhere classified: Secondary | ICD-10-CM | POA: Diagnosis not present

## 2017-08-02 DIAGNOSIS — M6281 Muscle weakness (generalized): Secondary | ICD-10-CM

## 2017-08-02 NOTE — Therapy (Signed)
[facility] [facility] [address] [place], [address] Phone: [phone]   Fax:  [phone]  Physical Therapy Treatment  Patient Details  Name: [name] MRN: [id number] Date of Birth: 13-Jun-1950 Referring Provider: Joni Fears, MD   Encounter Date: 08/02/2017  PT End of Session - 08/02/17 1439    Visit Number  33    Number of Visits  36    Date for PT Re-Evaluation  08/16/17    PT Start Time  3559    PT Stop Time  1513    PT Time Calculation (min)  38 min    Equipment Utilized During Treatment  Other (comment) pt hurry cane    Activity Tolerance  Patient tolerated treatment well    Behavior During Therapy  [facility] for tasks assessed/performed       Past Medical History:  Diagnosis Date  . Arthritis    back, fingers with joint pain and swelling.  chronic back pain  . Chronic back pain    arthritis   . Diverticulosis   . Elevated cholesterol    takes Niacin daily and Simvastatin  . GERD (gastroesophageal reflux disease) 06/2004   non-specific gastritis on EGD 06/2004  . Headache(784.0)    occasionally  . History of colon polyps 2004, 2009   2004:adenomatous. 2009 hyperplastic.   Marland Kitchen History of hiatal hernia   . Hypertension    takes Tenoretic and Lisinopril daily  . Mucoid cyst of joint 08/2013   right index finger  . Osteopenia 01/2014   T score -1.1 FRAX 14%/0.5%. Stable from prior DEXA  . PONV (postoperative nausea and vomiting)   . Seasonal allergies   . Stroke ([place]) 1998   x 2 - mild left-sided weakness  . Urge incontinence   . Uterine prolapse     Past Surgical History:  Procedure Laterality Date  . BACK SURGERY     x 2  . COLONOSCOPY  2004, 2009, 2014  . FINGER SURGERY    . GUM SURGERY    . GYNECOLOGIC CRYOSURGERY    . HYSTEROSCOPY W/D&C  12/07/2010   with resection of endometrial polyp  . KNEE ARTHROSCOPY Left 2008  . lip biopsy     done at MD office Fri 11/22/13  . MASS  EXCISION Right 08/15/2013   Procedure: RIGHT INDEX EXCISION MASS ;  Surgeon: Tennis Must, MD;  Location: [place];  Service: Orthopedics;  Laterality: Right;  . NASAL SEPTUM SURGERY    . OOPHORECTOMY Right 2000  . SHOULDER ARTHROSCOPY WITH OPEN ROTATOR CUFF REPAIR AND DISTAL CLAVICLE ACROMINECTOMY Right 11/26/2013   Procedure: RIGHT SHOULDER ARTHROSCOPY WITH MINI OPEN ROTATOR CUFF REPAIR AND DISTAL CLAVICLE RESECTION, SUBACROMIAL DECOMPRESSION, POSSIBLE [facility] PATCH.;  Surgeon: [name], MD;  Location: [place];  Service: Orthopedics;  Laterality: Right;  . TOTAL KNEE ARTHROPLASTY Right 03/21/2017  . TOTAL KNEE ARTHROPLASTY Right 03/21/2017   Procedure: RIGHT TOTAL KNEE ARTHROPLASTY;  Surgeon: [name], MD;  Location: [place];  Service: Orthopedics;  Laterality: Right;  . TUBAL LIGATION      There were no vitals filed for this visit.  Subjective Assessment - 08/02/17 1437    Subjective  Patient reports she feels she is doing better with less stiffness    Pertinent History  S/P R TKA on 03/21/2017. Pt had osteoarthritis and it was "bone on bone." Had 5 home health PT visits, and a little bit of PT at the  hospital. R knee hurts currently and feels tight.  PLOF: pt was using a SPC at times when her knee was acting up real bad but did not have to use her SPC all the time.      Limitations  Walking;Standing;House hold activities;Sitting    Patient Stated Goals  I just want to be able to get back to normal. (Has 5 dogs she has to take care of, do chores around the house, be active).     Currently in Pain?  Yes    Pain Score  3     Pain Location  Knee    Pain Orientation  Right;Medial    Pain Descriptors / Indicators  Aching;Tightness    Pain Type  -- 03/21/2017    Pain Onset  More than a month ago    Pain Frequency  Intermittent       Objective: AAROM right knee flexion 0-105 and up to 110 degrees  post treatment  Palpation: tenderness medial aspect right  knee, mild spasms right quadriceps central aspect, increased thickness in soft tissue just inferior to patella   Treatment:  Manual therapy: 13 min.: goal improve soft tissue elasticity, improve ROM right knee patella mobilization with patella mobilizer with patient supine x 5 sets distraction and inferior glides STM quadriceps right LE just proximal to incision and inferior to patella with patient sitting with right LE supported with improved mobility 50% and improved knee flexion to 110   Therapeutic exercise: patient performed with demonstration, instruction, verbal and tactile cues of therapist: goal: independent with home program, improve LEFS, ambulation without AD    Supine: Isometric hip abduction end range with manual resistance given 2 x 15 reps Hook lying clam with manual resistance given to right LE 2 x 15 reps   Sitting: AAROM knee flexion right knee Isometric right knee flexion with manual resistance end range to facilitate knee flexion; improved up to 110 degrees following multiple reps, sets   Standing: hip abduction and extension with green band around thighs with tapping balance stone x 10 each LE, each direction   Patient response to treatment: patient demonstrated improved technique with exercises with demonstration and minimal VC for correct alignment.  Patient with decreased spasms by 50% following STM. Improved motor control with repetition and cuing     PT Education - 08/02/17 1500    Education provided  Yes    Education Details  exercise instruction    Person(s) Educated  Patient    Methods  Explanation;Demonstration;Verbal cues    Comprehension  Verbalized understanding;Returned demonstration;Verbal cues required          PT Long Term Goals - 07/24/17 1525      PT LONG TERM GOAL #1   Title  Patient will be able to ambulate at least 500 ft without use of AD and no LOB to promote mobility.     Baseline  Pt currently ambulates with rw (04/12/2017); patient  ambulating with SPC with pain right and left LE knees and is using rolling walker as needed (05/29/17); 07/05/17 using c;ane for support; 07/24/2017 continues with cane for safety due to left knee pain    Status  Partially Met      PT LONG TERM GOAL #2   Title  Pt will improve seated R knee extension AROM to -8 degrees or less to promote ability to ambulate, perform standing tasks.     Baseline  seated R knee extension AROM -25 degrees (04/12/2017)    Status  Achieved      PT LONG TERM GOAL #3   Title  Pt will improve seated R knee flexion AROM to at least 115 degrees to promote ability to ambulate, negiotiate steps, perform transfers with less difficulty.     Baseline  seated R knee flexion AROM 77 degrees (04/12/2017); AAROM right knee flexion 0-  up to 100 degrees with pain limiting further motion (05/29/17); AROM up to 105 degrees/ AAROM 110     Status  On-going    Target Date  08/16/17      PT LONG TERM GOAL #4   Title  Pt will improve R knee flexion strength to at least 4+/5 and R knee extension strength to at least 5/5 to promote ability to ambulate, perform chores.     Baseline  right knee flexion 4-/5, knee extension improving 4-/5: (05/29/17): knee extension 4/5 with pain, knee flexion 4/5 with pain    Status  On-going    Target Date  08/16/17      PT LONG TERM GOAL #5   Title  Patient will improve her LEFS score to 50 or more points as a demonstration of improved function with daily tasks.    Baseline  15/80 (04/12/2017); LEFS 05/29/17 30/80; LEFS 07/05/17 27/80; 07/24/17 36/80    Status  On-going    Target Date  08/16/17            Plan - 08/02/17 1536    Clinical Impression Statement  Patient demonstrates steady progress with goals with improvement in ROM and strenght right LE s/p TKA. She continues with limiting factors of pain and decreased strrength and will benefit from additional physical therapy intervention to achieve goals.     Rehab Potential  Fair    Clinical Impairments  Affecting Rehab Potential  (-) healing time, pain; (+) husband support, acute condition, motivated    PT Frequency  2x / week    PT Duration  6 weeks    PT Treatment/Interventions  Neuromuscular re-education;Therapeutic exercise;Therapeutic activities;Manual techniques;Aquatic Therapy;Electrical Stimulation;Iontophoresis 67m/ml Dexamethasone;Gait training;Stair training;Balance training;Functional mobility training;Patient/family education;Scar mobilization;Passive range of motion;Dry needling    PT Next Visit Plan  ROM, gait, function, manual techniques, modalities PRN    PT Home Exercise Plan  ROM, standing hip abduction/extension; quad sets seated, knee flexion with resistive band; elevation/ use of ice for swelling/pain       Patient will benefit from skilled therapeutic intervention in order to improve the following deficits and impairments:  Pain, Difficulty walking, Decreased strength, Decreased scar mobility, Decreased range of motion  Visit Diagnosis: Acute pain of right knee  Muscle weakness (generalized)  Stiffness of right knee, not elsewhere classified  Difficulty in walking, not elsewhere classified     Problem List Patient Active Problem List   Diagnosis Date Noted  . S/P TKR (total knee replacement) using cement, right 03/21/2017  . Primary osteoarthritis of right knee 11/02/2016  . Nausea without vomiting 09/01/2016  . Chronic diarrhea 12/07/2014  . Segmental colitis with rectal bleeding ([place] 09/12/2014  . Routine general medical examination at a health care facility 06/05/2014  . Overweight 06/13/2011  . ABNORMAL CHEST XRAY 09/30/2007  . Hyperlipidemia 02/17/2007  . Essential hypertension 01/08/2007  . Osteoarthritis 01/08/2007    BJomarie LongsPT 08/03/2017, 9:22 PM  [facility] [address] [place] [address]Phone: [phone]  Fax:  [phone] Name: Stacy BRIONESMRN:  [id number] of Birth: 301-29-1952

## 2017-08-07 ENCOUNTER — Encounter: Payer: Self-pay | Admitting: Physical Therapy

## 2017-08-07 ENCOUNTER — Ambulatory Visit: Payer: Medicare Other | Admitting: Physical Therapy

## 2017-08-07 DIAGNOSIS — M25661 Stiffness of right knee, not elsewhere classified: Secondary | ICD-10-CM

## 2017-08-07 DIAGNOSIS — R262 Difficulty in walking, not elsewhere classified: Secondary | ICD-10-CM | POA: Diagnosis not present

## 2017-08-07 DIAGNOSIS — M6281 Muscle weakness (generalized): Secondary | ICD-10-CM | POA: Diagnosis not present

## 2017-08-07 DIAGNOSIS — M25561 Pain in right knee: Secondary | ICD-10-CM | POA: Diagnosis not present

## 2017-08-07 NOTE — Therapy (Signed)
[facility] [address], [place], [address] Phone: [phone]   Fax:  [phone]  Physical Therapy Treatment  Patient Details  Name: [name] MRN: [id number] Date of Birth: 01-17-1951 Referring Provider: Joni Fears, MD   Encounter Date: 08/07/2017  PT End of Session - 08/07/17 1437    Visit Number  34    Number of Visits  36    Date for PT Re-Evaluation  08/16/17    PT Start Time  1430    PT Stop Time  9675    PT Time Calculation (min)  44 min    Equipment Utilized During Treatment  Other (comment) pt hurry cane    Activity Tolerance  Patient tolerated treatment well    Behavior During Therapy  [facility] for tasks assessed/performed       Past Medical History:  Diagnosis Date  . Arthritis    back, fingers with joint pain and swelling.  chronic back pain  . Chronic back pain    arthritis   . Diverticulosis   . Elevated cholesterol    takes Niacin daily and Simvastatin  . GERD (gastroesophageal reflux disease) 06/2004   non-specific gastritis on EGD 06/2004  . Headache(784.0)    occasionally  . History of colon polyps 2004, 2009   2004:adenomatous. 2009 hyperplastic.   Marland Kitchen History of hiatal hernia   . Hypertension    takes Tenoretic and Lisinopril daily  . Mucoid cyst of joint 08/2013   right index finger  . Osteopenia 01/2014   T score -1.1 FRAX 14%/0.5%. Stable from prior DEXA  . PONV (postoperative nausea and vomiting)   . Seasonal allergies   . Stroke ([place]) 1998   x 2 - mild left-sided weakness  . Urge incontinence   . Uterine prolapse     Past Surgical History:  Procedure Laterality Date  . BACK SURGERY     x 2  . COLONOSCOPY  2004, 2009, 2014  . FINGER SURGERY    . GUM SURGERY    . GYNECOLOGIC CRYOSURGERY    . HYSTEROSCOPY W/D&C  12/07/2010   with resection of endometrial polyp  . KNEE ARTHROSCOPY Left 2008  . lip biopsy     done at MD office Fri 11/22/13  . MASS  EXCISION Right 08/15/2013   Procedure: RIGHT INDEX EXCISION MASS ;  Surgeon: Tennis Must, MD;  Location: [place];  Service: Orthopedics;  Laterality: Right;  . NASAL SEPTUM SURGERY    . OOPHORECTOMY Right 2000  . SHOULDER ARTHROSCOPY WITH OPEN ROTATOR CUFF REPAIR AND DISTAL CLAVICLE ACROMINECTOMY Right 11/26/2013   Procedure: RIGHT SHOULDER ARTHROSCOPY WITH MINI OPEN ROTATOR CUFF REPAIR AND DISTAL CLAVICLE RESECTION, SUBACROMIAL DECOMPRESSION, POSSIBLE [facility] PATCH.;  Surgeon: [name], MD;  Location: [place];  Service: Orthopedics;  Laterality: Right;  . TOTAL KNEE ARTHROPLASTY Right 03/21/2017  . TOTAL KNEE ARTHROPLASTY Right 03/21/2017   Procedure: RIGHT TOTAL KNEE ARTHROPLASTY;  Surgeon: [name], MD;  Location: [facility];  Service: Orthopedics;  Laterality: Right;  . TUBAL LIGATION      There were no vitals filed for this visit.  Subjective Assessment - 08/07/17 1433    Subjective  Patient reports she is progressing slowly.     Pertinent History  S/P R TKA on 03/21/2017. Pt had osteoarthritis and it was "bone on bone." Had 5 home health PT visits, and a little bit of PT at the hospital. R knee hurts  currently and feels tight.  PLOF: pt was using a SPC at times when her knee was acting up real bad but did not have to use her SPC all the time.      Limitations  Walking;Standing;House hold activities;Sitting    Patient Stated Goals  I just want to be able to get back to normal. (Has 5 dogs she has to take care of, do chores around the house, be active).     Currently in Pain?  Yes    Pain Score  3     Pain Location  Knee    Pain Orientation  Right;Medial    Pain Descriptors / Indicators  Aching;Tightness    Pain Type  Surgical pain 03/21/2017    Pain Onset  More than a month ago    Pain Frequency  Intermittent       Objective: AAROM right knee flexion 0-105 and up to 110 degrees  post treatment  Palpation: decreased soft tissue elasticity along  incision, decreased patella mobility inferiorly Strength: right hip abduction 3/5, knee flexion 4/5    Treatment:  Manual therapy: 18 min.: goal improve soft tissue elasticity, improve ROM right knee patella mobilization with patella mobilizer with patient supine x 5 sets distraction and inferior glides STM quadriceps right LE along incision and inferior to patella and gentle stretch x 3 reps with MFR techniques to quadriceps with patient sitting with right LE supported with improved soft tissue mobility > 50% and improved knee flexion to 110   Therapeutic exercise: patient performed with demonstration, instruction, verbal and tactile cues of therapist: goal: independent with home program, improve LEFS, ambulation without AD     Sitting: AAROM knee flexion right knee Isometric right knee flexion with manual resistance end range to facilitate knee flexion; improved up to 110 degrees following multiple reps, sets  side lying left:  right hip abduction with assist of therapist for correct alignment 3 x 5 reps  clamshells 3 x 5 reps with isometric hold end range x 10 seconds with moderat manual resistance    Standing: hip abduction and extension with green band around thighs with tapping balance stone 3 x 5 reps each LE, each direction step up and over (2) balance stones x 10 reps with UE support on counter; green resistive band around thighs  therapist demonstrated standing knee flexion with green resistive band around ankles and side lying hip abduction with one LE off edge of table to position hip and knee in correct alignment   Patient response to treatment: patient demonstrated improved technqiue and motor control with exercises with minimal VC and repeated demonstration and with repetition. improved soft tissue elatsticity and ROM following STM/patella mobilization     PT Education - 08/07/17 1437    Education provided  Yes    Education Details  exercise instruction    Methods   Explanation;Demonstration;Verbal cues    Comprehension  Verbalized understanding;Returned demonstration;Verbal cues required          PT Long Term Goals - 07/24/17 1525      PT LONG TERM GOAL #1   Title  Patient will be able to ambulate at least 500 ft without use of AD and no LOB to promote mobility.     Baseline  Pt currently ambulates with rw (04/12/2017); patient ambulating with SPC with pain right and left LE knees and is using rolling walker as needed (05/29/17); 07/05/17 using c;ane for support; 07/24/2017 continues with cane for safety due to left knee  pain    Status  Partially Met      PT LONG TERM GOAL #2   Title  Pt will improve seated R knee extension AROM to -8 degrees or less to promote ability to ambulate, perform standing tasks.     Baseline  seated R knee extension AROM -25 degrees (04/12/2017)    Status  Achieved      PT LONG TERM GOAL #3   Title  Pt will improve seated R knee flexion AROM to at least 115 degrees to promote ability to ambulate, negiotiate steps, perform transfers with less difficulty.     Baseline  seated R knee flexion AROM 77 degrees (04/12/2017); AAROM right knee flexion 0-  up to 100 degrees with pain limiting further motion (05/29/17); AROM up to 105 degrees/ AAROM 110     Status  On-going    Target Date  08/16/17      PT LONG TERM GOAL #4   Title  Pt will improve R knee flexion strength to at least 4+/5 and R knee extension strength to at least 5/5 to promote ability to ambulate, perform chores.     Baseline  right knee flexion 4-/5, knee extension improving 4-/5: (05/29/17): knee extension 4/5 with pain, knee flexion 4/5 with pain    Status  On-going    Target Date  08/16/17      PT LONG TERM GOAL #5   Title  Patient will improve her LEFS score to 50 or more points as a demonstration of improved function with daily tasks.    Baseline  15/80 (04/12/2017); LEFS 05/29/17 30/80; LEFS 07/05/17 27/80; 07/24/17 36/80    Status  On-going    Target Date  08/16/17             Plan - 08/07/17 1549    Clinical Impression Statement  Patient demonstrates steady progress with goals with improvement noted in ROM and strength right LE. She continues with decreased strength right hip abduction and extension and will benefti from additional physical therapy intervention to achieve goals.     Rehab Potential  Fair    Clinical Impairments Affecting Rehab Potential  (-) healing time, pain; (+) husband support, acute condition, motivated    PT Frequency  2x / week    PT Duration  6 weeks    PT Treatment/Interventions  Neuromuscular re-education;Therapeutic exercise;Therapeutic activities;Manual techniques;Aquatic Therapy;Electrical Stimulation;Iontophoresis 30m/ml Dexamethasone;Gait training;Stair training;Balance training;Functional mobility training;Patient/family education;Scar mobilization;Passive range of motion;Dry needling    PT Next Visit Plan  ROM, gait, function, manual techniques, modalities PRN    PT Home Exercise Plan  ROM, standing hip abduction/extension; quad sets seated, knee flexion with resistive band; elevation/ use of ice for swelling/pain       Patient will benefit from skilled therapeutic intervention in order to improve the following deficits and impairments:  Pain, Difficulty walking, Decreased strength, Decreased scar mobility, Decreased range of motion  Visit Diagnosis: Acute pain of right knee  Muscle weakness (generalized)  Stiffness of right knee, not elsewhere classified  Difficulty in walking, not elsewhere classified     Problem List Patient Active Problem List   Diagnosis Date Noted  . S/P TKR (total knee replacement) using cement, right 03/21/2017  . Primary osteoarthritis of right knee 11/02/2016  . Nausea without vomiting 09/01/2016  . Chronic diarrhea 12/07/2014  . Segmental [place] 09/12/2014  . Routine general medical examination at a health care facility 06/05/2014  . Overweight  06/13/2011  . ABNORMAL  CHEST XRAY 09/30/2007  . Hyperlipidemia 02/17/2007  . Essential hypertension 01/08/2007  . Osteoarthritis 01/08/2007    Jomarie Longs PT 08/07/2017, 3:51 PM  [facility] [address], [place], [address] Phone: [phone]   Fax:  [phone]  Name: EMRI SAMPLE MRN: [id number] Date of Birth: 24-Feb-1951

## 2017-08-09 ENCOUNTER — Encounter: Payer: Self-pay | Admitting: Physical Therapy

## 2017-08-09 ENCOUNTER — Ambulatory Visit: Payer: Medicare Other | Attending: Orthopaedic Surgery | Admitting: Physical Therapy

## 2017-08-09 DIAGNOSIS — M25561 Pain in right knee: Secondary | ICD-10-CM | POA: Insufficient documentation

## 2017-08-09 DIAGNOSIS — R262 Difficulty in walking, not elsewhere classified: Secondary | ICD-10-CM | POA: Diagnosis not present

## 2017-08-09 DIAGNOSIS — M6281 Muscle weakness (generalized): Secondary | ICD-10-CM | POA: Diagnosis not present

## 2017-08-09 DIAGNOSIS — M25661 Stiffness of right knee, not elsewhere classified: Secondary | ICD-10-CM | POA: Diagnosis not present

## 2017-08-09 NOTE — Therapy (Signed)
[facility] [address] [place], [address] Phone: [phone]   Fax:  [phone]  Physical Therapy Treatment  Patient Details  Name: [name] MRN: [id number] Date of Birth: [birth year] Referring Provider: Joni Fears, MD   Encounter Date: 08/09/2017  PT End of Session - 08/09/17 1228    Visit Number  35    Number of Visits  36    Date for PT Re-Evaluation  08/16/17    PT Start Time  7564    PT Stop Time  1225    PT Time Calculation (min)  43 min    Equipment Utilized During Treatment  Other (comment) pt hurry cane    Activity Tolerance  Patient tolerated treatment well    Behavior During Therapy  [facility] for tasks assessed/performed       Past Medical History:  Diagnosis Date  . Arthritis    back, fingers with joint pain and swelling.  chronic back pain  . Chronic back pain    arthritis   . Diverticulosis   . Elevated cholesterol    takes Niacin daily and Simvastatin  . GERD (gastroesophageal reflux disease) 06/2004   non-specific gastritis on EGD 06/2004  . Headache(784.0)    occasionally  . History of colon polyps 2004, 2009   2004:adenomatous. 2009 hyperplastic.   Marland Kitchen History of hiatal hernia   . Hypertension    takes Tenoretic and Lisinopril daily  . Mucoid cyst of joint 08/2013   right index finger  . Osteopenia 01/2014   T score -1.1 FRAX 14%/0.5%. Stable from prior DEXA  . PONV (postoperative nausea and vomiting)   . Seasonal allergies   . Stroke ([place]) 1998   x 2 - mild left-sided weakness  . Urge incontinence   . Uterine prolapse     Past Surgical History:  Procedure Laterality Date  . BACK SURGERY     x 2  . COLONOSCOPY  2004, 2009, 2014  . FINGER SURGERY    . GUM SURGERY    . GYNECOLOGIC CRYOSURGERY    . HYSTEROSCOPY W/D&C  12/07/2010   with resection of endometrial polyp  . KNEE ARTHROSCOPY Left 2008  . lip biopsy     done at MD office Fri 11/22/13  . MASS  EXCISION Right 08/15/2013   Procedure: RIGHT INDEX EXCISION MASS ;  Surgeon: Tennis Must, MD;  Location: [facility];  Service: Orthopedics;  Laterality: Right;  . NASAL SEPTUM SURGERY    . OOPHORECTOMY Right 2000  . SHOULDER ARTHROSCOPY WITH OPEN ROTATOR CUFF REPAIR AND DISTAL CLAVICLE ACROMINECTOMY Right 11/26/2013   Procedure: RIGHT SHOULDER ARTHROSCOPY WITH MINI OPEN ROTATOR CUFF REPAIR AND DISTAL CLAVICLE RESECTION, SUBACROMIAL DECOMPRESSION, POSSIBLE [facility] PATCH.;  Surgeon: [name], MD;  Location: [facility];  Service: Orthopedics;  Laterality: Right;  . TOTAL KNEE ARTHROPLASTY Right 03/21/2017  . TOTAL KNEE ARTHROPLASTY Right 03/21/2017   Procedure: RIGHT TOTAL KNEE ARTHROPLASTY;  Surgeon: [name], MD;  Location: [place];  Service: Orthopedics;  Laterality: Right;  . TUBAL LIGATION      There were no vitals filed for this visit.  Subjective Assessment - 08/09/17 1143    Subjective  Patient reports slow progress.     Pertinent History  S/P R TKA on 03/21/2017. Pt had osteoarthritis and it was "bone on bone." Had 5 home health PT visits, and a little bit of PT at the hospital. R knee hurts currently and  feels tight.  PLOF: pt was using a SPC at times when her knee was acting up real bad but did not have to use her SPC all the time.      Limitations  Walking;Standing;House hold activities;Sitting    Patient Stated Goals  I just want to be able to get back to normal. (Has 5 dogs she has to take care of, do chores around the house, be active).     Currently in Pain?  Yes    Pain Score  3     Pain Location  Knee    Pain Orientation  Right;Medial    Pain Descriptors / Indicators  Tightness    Pain Type  -- 03/21/2017    Pain Onset  More than a month ago    Pain Frequency  Intermittent       Objective:  AAROM right knee flexion 0-105 and up to 110 degrees post treatment  Palpation: decreased soft tissue elasticity along incision, decreased patella  mobility inferiorly   Treatment:  Manual therapy: 23 min.: goal improve soft tissue elasticity, improve ROM right knee  patella mobilization with patella mobilizer with patient supine x 3 sets distraction and inferior glides  STM quadriceps right LE along incision and inferior to patella and gentle stretch x 3 reps with MFR techniques to quadriceps with patient sitting with right LE supported with improved soft tissue mobility > 50% and improved knee flexion to 110   Therapeutic exercise: patient performed with demonstration, instruction, verbal and tactile cues of therapist: goal: independent with home program, improve LEFS, ambulation without AD   Sitting:  AAROM knee flexion right knee  Isometric right knee flexion with manual resistance end range to facilitate knee flexion Right knee resistive flexion with green resistive band 2 x 15 reps  side lying left:  right hip abduction with assist of therapist for correct alignment 2 x 10 reps with LE over edge of treatment table clamshells 2 x 15 reps with isometric hold end range x 10 seconds with moderat manual resistance   Standing:  hip abduction and extension with green band around thighs with tapping balance stone 3 x 5 reps each LE, each direction  step up and over (2) balance stones x 10 reps with UE support on counter; green resistive band around thighs   Patient response to treatment: Patient with improved soft tissue elasticity and improved ROM up to 112 degrees flexion following STM and isometric knee flexion. Patient demonstrated improved motor control with exercises with minimal assistance and VC.      PT Education - 08/09/17 1220    Education provided  Yes    Education Details  exercise instruction    Person(s) Educated  Patient    Methods  Explanation;Demonstration;Verbal cues    Comprehension  Verbalized understanding;Returned demonstration;Verbal cues required          PT Long Term Goals - 07/24/17 1525      PT  LONG TERM GOAL #1   Title  Patient will be able to ambulate at least 500 ft without use of AD and no LOB to promote mobility.     Baseline  Pt currently ambulates with rw (04/12/2017); patient ambulating with SPC with pain right and left LE knees and is using rolling walker as needed (05/29/17); 07/05/17 using c;ane for support; 07/24/2017 continues with cane for safety due to left knee pain    Status  Partially Met      PT LONG TERM GOAL #2  Title  Pt will improve seated R knee extension AROM to -8 degrees or less to promote ability to ambulate, perform standing tasks.     Baseline  seated R knee extension AROM -25 degrees (04/12/2017)    Status  Achieved      PT LONG TERM GOAL #3   Title  Pt will improve seated R knee flexion AROM to at least 115 degrees to promote ability to ambulate, negiotiate steps, perform transfers with less difficulty.     Baseline  seated R knee flexion AROM 77 degrees (04/12/2017); AAROM right knee flexion 0-  up to 100 degrees with pain limiting further motion (05/29/17); AROM up to 105 degrees/ AAROM 110     Status  On-going    Target Date  08/16/17      PT LONG TERM GOAL #4   Title  Pt will improve R knee flexion strength to at least 4+/5 and R knee extension strength to at least 5/5 to promote ability to ambulate, perform chores.     Baseline  right knee flexion 4-/5, knee extension improving 4-/5: (05/29/17): knee extension 4/5 with pain, knee flexion 4/5 with pain    Status  On-going    Target Date  08/16/17      PT LONG TERM GOAL #5   Title  Patient will improve her LEFS score to 50 or more points as a demonstration of improved function with daily tasks.    Baseline  15/80 (04/12/2017); LEFS 05/29/17 30/80; LEFS 07/05/17 27/80; 07/24/17 36/80    Status  On-going    Target Date  08/16/17            Plan - 08/09/17 1231    Clinical Impression Statement  Patient is progressing steadily with ROM and strength right LE hip and knee. She continues with stiffness and  limited function with daily tasks and will require continued physical therapy intervention to achieve goals.     Rehab Potential  Fair    Clinical Impairments Affecting Rehab Potential  (-) healing time, pain; (+) husband support, acute condition, motivated    PT Frequency  2x / week    PT Duration  6 weeks    PT Treatment/Interventions  Neuromuscular re-education;Therapeutic exercise;Therapeutic activities;Manual techniques;Aquatic Therapy;Electrical Stimulation;Iontophoresis 56m/ml Dexamethasone;Gait training;Stair training;Balance training;Functional mobility training;Patient/family education;Scar mobilization;Passive range of motion;Dry needling    PT Next Visit Plan  ROM, gait, function, manual techniques, modalities PRN    PT Home Exercise Plan  ROM, standing hip abduction/extension; quad sets seated, knee flexion with resistive band; elevation/ use of ice for swelling/pain       Patient will benefit from skilled therapeutic intervention in order to improve the following deficits and impairments:  Pain, Difficulty walking, Decreased strength, Decreased scar mobility, Decreased range of motion  Visit Diagnosis: Acute pain of right knee  Muscle weakness (generalized)  Stiffness of right knee, not elsewhere classified  Difficulty in walking, not elsewhere classified     Problem List Patient Active Problem List   Diagnosis Date Noted  . S/P TKR (total knee replacement) using cement, right 03/21/2017  . Primary osteoarthritis of right knee 11/02/2016  . Nausea without vomiting 09/01/2016  . Chronic diarrhea 12/07/2014  . Seg[place] 09/12/2014  . Routine general medical examination at a health care facility 06/05/2014  . Overweight 06/13/2011  . ABNORMAL CHEST XRAY 09/30/2007  . Hyperlipidemia 02/17/2007  . Essential hypertension 01/08/2007  . Osteoarthritis 01/08/2007    BJomarie LongsPT 08/10/2017, 3:59  PM  [facility] [address], [place], [address] Phone: [phone]   Fax:  [phone]  Name: [name] MRN: [id number] Date of Birth: [birth year]

## 2017-08-10 ENCOUNTER — Encounter: Payer: Self-pay | Admitting: Internal Medicine

## 2017-08-10 DIAGNOSIS — B079 Viral wart, unspecified: Secondary | ICD-10-CM | POA: Diagnosis not present

## 2017-08-10 DIAGNOSIS — L820 Inflamed seborrheic keratosis: Secondary | ICD-10-CM | POA: Diagnosis not present

## 2017-08-22 ENCOUNTER — Ambulatory Visit: Payer: Medicare Other | Admitting: Physical Therapy

## 2017-08-22 ENCOUNTER — Encounter: Payer: Self-pay | Admitting: Physical Therapy

## 2017-08-22 DIAGNOSIS — M25661 Stiffness of right knee, not elsewhere classified: Secondary | ICD-10-CM

## 2017-08-22 DIAGNOSIS — R262 Difficulty in walking, not elsewhere classified: Secondary | ICD-10-CM

## 2017-08-22 DIAGNOSIS — M25561 Pain in right knee: Secondary | ICD-10-CM

## 2017-08-22 DIAGNOSIS — M6281 Muscle weakness (generalized): Secondary | ICD-10-CM

## 2017-08-22 NOTE — Therapy (Signed)
[facility] [address], [place], [address] Phone: [phone]   Fax:  [phone]  Physical Therapy Treatment  Patient Details  Name: [name] MRN: [id number] Date of Birth: [birth year] Referring Provider: Joni Fears, MD   Encounter Date: 08/22/2017  PT End of Session - 08/22/17 0906    Visit Number  36    Number of Visits  48    Date for PT Re-Evaluation  10/03/17    PT Start Time  0901    PT Stop Time  0947    PT Time Calculation (min)  46 min    Equipment Utilized During Treatment  Other (comment) pt hurry cane    Activity Tolerance  Patient tolerated treatment well    Behavior During Therapy  [facility] for tasks assessed/performed       Past Medical History:  Diagnosis Date  . Arthritis    back, fingers with joint pain and swelling.  chronic back pain  . Chronic back pain    arthritis   . Diverticulosis   . Elevated cholesterol    takes Niacin daily and Simvastatin  . GERD (gastroesophageal reflux disease) 06/2004   non-specific gastritis on EGD 06/2004  . Headache(784.0)    occasionally  . History of colon polyps 2004, 2009   2004:adenomatous. 2009 hyperplastic.   Marland Kitchen History of hiatal hernia   . Hypertension    takes Tenoretic and Lisinopril daily  . Mucoid cyst of joint 08/2013   right index finger  . Osteopenia 01/2014   T score -1.1 FRAX 14%/0.5%. Stable from prior DEXA  . PONV (postoperative nausea and vomiting)   . Seasonal allergies   . Stroke ([place]) 1998   x 2 - mild left-sided weakness  . Urge incontinence   . Uterine prolapse     Past Surgical History:  Procedure Laterality Date  . BACK SURGERY     x 2  . COLONOSCOPY  2004, 2009, 2014  . FINGER SURGERY    . GUM SURGERY    . GYNECOLOGIC CRYOSURGERY    . HYSTEROSCOPY W/D&C  12/07/2010   with resection of endometrial polyp  . KNEE ARTHROSCOPY Left 2008  . lip biopsy     done at MD office Fri 11/22/13  . MASS  EXCISION Right 08/15/2013   Procedure: RIGHT INDEX EXCISION MASS ;  Surgeon: Tennis Must, MD;  Location: [place];  Service: Orthopedics;  Laterality: Right;  . NASAL SEPTUM SURGERY    . OOPHORECTOMY Right 2000  . SHOULDER ARTHROSCOPY WITH OPEN ROTATOR CUFF REPAIR AND DISTAL CLAVICLE ACROMINECTOMY Right 11/26/2013   Procedure: RIGHT SHOULDER ARTHROSCOPY WITH MINI OPEN ROTATOR CUFF REPAIR AND DISTAL CLAVICLE RESECTION, SUBACROMIAL DECOMPRESSION, POSSIBLE [facility] PATCH.;  Surgeon: [name], MD;  Location: [place];  Service: Orthopedics;  Laterality: Right;  . TOTAL KNEE ARTHROPLASTY Right 03/21/2017  . TOTAL KNEE ARTHROPLASTY Right 03/21/2017   Procedure: RIGHT TOTAL KNEE ARTHROPLASTY;  Surgeon: [name], MD;  Location: [place];  Service: Orthopedics;  Laterality: Right;  . TUBAL LIGATION      There were no vitals filed for this visit.  Subjective Assessment - 08/22/17 0903    Subjective  Patient reports she is still having stiffness in left knee and right LE is hurting worse than left LE at this time with rising from sitting.     Pertinent History  S/P R TKA on 03/21/2017. Pt had osteoarthritis and it was "bone  on bone." Had 5 home health PT visits, and a little bit of PT at the hospital. R knee hurts currently and feels tight.  PLOF: pt was using a SPC at times when her knee was acting up real bad but did not have to use her SPC all the time.      Limitations  Walking;Standing;House hold activities;Sitting    Patient Stated Goals  I just want to be able to get back to normal. (Has 5 dogs she has to take care of, do chores around the house, be active).     Currently in Pain?  Yes    Pain Score  3     Pain Location  Knee    Pain Orientation  Right    Pain Descriptors / Indicators  Tightness    Pain Type  Surgical pain 03/21/2017    Pain Onset  More than a month ago    Pain Frequency  Intermittent       Objective:  AAROM right knee flexion 0-110 and up to  112 degrees post treatment  Palpation: decreased soft tissue elasticity along incision, decreased patella mobility inferiorly  Strength: right knee extension 4+/5; knee flexion 4-/5: left LE flexion knee 4-/5; knee extension 4-/5 Gait: decreased trunk rotation with increased lateral trunk flexion bilaterally LEFS: 36/80 10MW 11.5 seconds (WNL for community ambulation)   Treatment:  Manual therapy:  23 min.: goal improve soft tissue elasticity, improve ROM right knee  patella mobilization with patella mobilizer with patient supine x 3 sets distraction and inferior glides  STM quadriceps right LE along incision and inferior to patella and gentle stretch x 3 reps with MFR techniques to quadriceps with patient sitting with right LE supported with improved soft tissue mobility > 50% and improved knee flexion to 112    Therapeutic exercise: patient performed with demonstration, instruction, verbal and tactile cues of therapist: goal: independent with home program, improve LEFS, ambulation without AD    Sitting:  AAROM knee flexion right knee  Isometric right knee flexion with manual resistance end range to facilitate knee flexion Right and left knee resistive flexion with green resistive band 2 x 15 reps   side lying left:  right hip abduction with assist of therapist for correct alignment 2 x 10 reps with LE over edge of treatment table; green resistive band around thighs clamshells 2 x 15 reps with isometric hold end range with green resistive band around thighs   Patient response to treatment: Patient with improved soft tissue elasticity and improved ROM up to 112 degrees flexion following STM and isometric knee flexion. Patient demonstrated improved motor control with exercises with minimal assistance and VC.   [facility] PT Assessment - 08/22/17 2249      Assessment   Medical Diagnosis  R total knee replacement     Onset Date/Surgical Date  03/21/17      Observation/Other Assessments   Lower  Extremity Functional Scale   36/80      AROM   Right Knee Extension  0    Right Knee Flexion  105          PT Education - 08/22/17 0905    Education provided  Yes    Education Details  exercise instruction     Person(s) Educated  Patient    Methods  Explanation;Demonstration;Verbal cues    Comprehension  Returned demonstration;Verbalized understanding;Verbal cues required          PT Long Term Goals - 08/22/17 1000  PT LONG TERM GOAL #1   Title  Patient will be able to ambulate at least 500 ft without use of AD and no LOB to promote mobility.     Baseline  Pt currently ambulates with rw (04/12/2017); patient ambulating with SPC with pain right and left LE knees and is using rolling walker as needed (05/29/17); 07/05/17 using c;ane for support; 07/24/2017 continues with cane for safety due to left knee pain    Status  Partially Met      PT LONG TERM GOAL #2   Title  Pt will improve seated R knee extension AROM to -8 degrees or less to promote ability to ambulate, perform standing tasks.     Baseline  seated R knee extension AROM -25 degrees (04/12/2017)    Status  Achieved      PT LONG TERM GOAL #3   Title  Pt will improve seated R knee flexion AROM to at least 115 degrees to promote ability to ambulate, negiotiate steps, perform transfers with less difficulty.     Baseline  seated R knee flexion AROM 77 degrees (04/12/2017); AAROM right knee flexion 0-  up to 100 degrees with pain limiting further motion (05/29/17); AROM up to 105 degrees/ AAROM 110     Status  On-going    Target Date  09/19/17      PT LONG TERM GOAL #4   Title  Pt will improve R knee flexion strength to at least 4+/5 and R knee extension strength to at least 5/5 to promote ability to ambulate, perform chores.     Baseline  right knee flexion 4-/5, knee extension improving 4-/5: (05/29/17): knee extension 4/5 with pain, knee flexion 4/5 with pain: 08/22/17: right knee flexion 4/5, extension 4/5     Status   On-going    Target Date  10/03/17      PT LONG TERM GOAL #5   Title  Patient will improve her LEFS score to 50 or more points as a demonstration of improved function with daily tasks.    Baseline  15/80 (04/12/2017); LEFS 05/29/17 30/80; LEFS 07/05/17 27/80; 07/24/17 36/80; 08/22/17 36/80    Status  On-going    Target Date  10/03/17            Plan - 08/22/17 0944    Clinical Impression Statement  Patient continues steady progress with ROM and strength s/p right TKA. Her AAROM right knee flexion is 110 and her strength is improving right knee extension, flexion and hip strength. Her LEFS score of 36/80 indicates moderate self perceived disability and she will beneift from continued pysical therapy intervention to address limitations in order to achieve goals.     Rehab Potential  Fair    Clinical Impairments Affecting Rehab Potential  (-) healing time, pain; (+) husband support, acute condition, motivated    PT Frequency  2x / week    PT Duration  6 weeks    PT Treatment/Interventions  Neuromuscular re-education;Therapeutic exercise;Therapeutic activities;Manual techniques;Aquatic Therapy;Electrical Stimulation;Iontophoresis 38m/ml Dexamethasone;Gait training;Stair training;Balance training;Functional mobility training;Patient/family education;Scar mobilization;Passive range of motion;Dry needling    PT Next Visit Plan  ROM, gait, function, manual techniques, modalities PRN    PT Home Exercise Plan  ROM, standing hip abduction/extension; quad sets seated, knee flexion with resistive band; elevation/ use of ice for swelling/pain    Consulted and Agree with Plan of Care  Patient       Patient will benefit from skilled therapeutic intervention in order to improve the  following deficits and impairments:  Pain, Difficulty walking, Decreased strength, Decreased scar mobility, Decreased range of motion  Visit Diagnosis: Acute pain of right knee - Plan: PT plan of care cert/re-cert  Muscle  weakness (generalized) - Plan: PT plan of care cert/re-cert  Stiffness of right knee, not elsewhere classified - Plan: PT plan of care cert/re-cert  Difficulty in walking, not elsewhere classified - Plan: PT plan of care cert/re-cert     Problem List Patient Active Problem List   Diagnosis Date Noted  . S/P TKR (total knee replacement) using cement, right 03/21/2017  . Primary osteoarthritis of right knee 11/02/2016  . Nausea without vomiting 09/01/2016  . Chronic diarrhea 12/07/2014  . Segmental colitis with rectal bleeding ([place]) 09/12/2014  . Routine general medical examination at a health care facility 06/05/2014  . Overweight 06/13/2011  . ABNORMAL CHEST XRAY 09/30/2007  . Hyperlipidemia 02/17/2007  . Essential hypertension 01/08/2007  . Osteoarthritis 01/08/2007    Jomarie Longs PT 08/23/2017, 8:56 AM  [place] [facility] [address], [place], [address] Phone: [phone]   Fax:  [phone]  Name: [name] MRN: [id number] Date of Birth: 11-26-1950

## 2017-08-23 ENCOUNTER — Encounter: Payer: Medicare Other | Admitting: Physical Therapy

## 2017-08-24 ENCOUNTER — Encounter: Payer: Self-pay | Admitting: Physical Therapy

## 2017-08-24 ENCOUNTER — Ambulatory Visit: Payer: Medicare Other | Admitting: Physical Therapy

## 2017-08-24 DIAGNOSIS — M25661 Stiffness of right knee, not elsewhere classified: Secondary | ICD-10-CM | POA: Diagnosis not present

## 2017-08-24 DIAGNOSIS — M6281 Muscle weakness (generalized): Secondary | ICD-10-CM

## 2017-08-24 DIAGNOSIS — M25561 Pain in right knee: Secondary | ICD-10-CM | POA: Diagnosis not present

## 2017-08-24 DIAGNOSIS — R262 Difficulty in walking, not elsewhere classified: Secondary | ICD-10-CM

## 2017-08-24 NOTE — Therapy (Signed)
[facility] [address], [place], [address] Phone: [phone]   Fax:  [phone]  Physical Therapy Treatment  Patient Details  Name: [name] MRN: [id number] Date of Birth: 1950-07-06 Referring Provider: Joni Fears, MD   Encounter Date: 08/24/2017  PT End of Session - 08/24/17 1854    Visit Number  37    Number of Visits  48    Date for PT Re-Evaluation  10/03/17    PT Start Time  1518    PT Stop Time  1603    PT Time Calculation (min)  45 min    Equipment Utilized During Treatment  Other (comment) pt hurry cane    Activity Tolerance  Patient tolerated treatment well    Behavior During Therapy  [facility] for tasks assessed/performed       Past Medical History:  Diagnosis Date  . Arthritis    back, fingers with joint pain and swelling.  chronic back pain  . Chronic back pain    arthritis   . Diverticulosis   . Elevated cholesterol    takes Niacin daily and Simvastatin  . GERD (gastroesophageal reflux disease) 06/2004   non-specific gastritis on EGD 06/2004  . Headache(784.0)    occasionally  . History of colon polyps 2004, 2009   2004:adenomatous. 2009 hyperplastic.   Marland Kitchen History of hiatal hernia   . Hypertension    takes Tenoretic and Lisinopril daily  . Mucoid cyst of joint 08/2013   right index finger  . Osteopenia 01/2014   T score -1.1 FRAX 14%/0.5%. Stable from prior DEXA  . PONV (postoperative nausea and vomiting)   . Seasonal allergies   . Stroke ([place]) 1998   x 2 - mild left-sided weakness  . Urge incontinence   . Uterine prolapse     Past Surgical History:  Procedure Laterality Date  . BACK SURGERY     x 2  . COLONOSCOPY  2004, 2009, 2014  . FINGER SURGERY    . GUM SURGERY    . GYNECOLOGIC CRYOSURGERY    . HYSTEROSCOPY W/D&C  12/07/2010   with resection of endometrial polyp  . KNEE ARTHROSCOPY Left 2008  . lip biopsy     done at MD office Fri 11/22/13  . MASS  EXCISION Right 08/15/2013   Procedure: RIGHT INDEX EXCISION MASS ;  Surgeon: Tennis Must, MD;  Location: [place];  Service: Orthopedics;  Laterality: Right;  . NASAL SEPTUM SURGERY    . OOPHORECTOMY Right 2000  . SHOULDER ARTHROSCOPY WITH OPEN ROTATOR CUFF REPAIR AND DISTAL CLAVICLE ACROMINECTOMY Right 11/26/2013   Procedure: RIGHT SHOULDER ARTHROSCOPY WITH MINI OPEN ROTATOR CUFF REPAIR AND DISTAL CLAVICLE RESECTION, SUBACROMIAL DECOMPRESSION, POSSIBLE [facility] PATCH.;  Surgeon: [name], MD;  Location: [place];  Service: Orthopedics;  Laterality: Right;  . TOTAL KNEE ARTHROPLASTY Right 03/21/2017  . TOTAL KNEE ARTHROPLASTY Right 03/21/2017   Procedure: RIGHT TOTAL KNEE ARTHROPLASTY;  Surgeon: [name], MD;  Location: [place];  Service: Orthopedics;  Laterality: Right;  . TUBAL LIGATION      There were no vitals filed for this visit.  Subjective Assessment - 08/24/17 1523    Subjective  Patient reports she is still having increased stiffness in right knee and left knee is worse that right LE with activity. She had a massage the other day and felt sore in right and left LE    Pertinent History  S/P R TKA  on 03/21/2017. Pt had osteoarthritis and it was "bone on bone." Had 5 home health PT visits, and a little bit of PT at the hospital. R knee hurts currently and feels tight.  PLOF: pt was using a SPC at times when her knee was acting up real bad but did not have to use her SPC all the time.      Limitations  Walking;Standing;House hold activities;Sitting    Patient Stated Goals  I just want to be able to get back to normal. (Has 5 dogs she has to take care of, do chores around the house, be active).     Currently in Pain?  Yes    Pain Score  3     Pain Location  Knee    Pain Orientation  Right    Pain Descriptors / Indicators  Tightness;Sore    Pain Type  Surgical pain 03/21/2017    Pain Onset  More than a month ago    Pain Frequency  Intermittent          Objective:  AAROM right knee flexion 0-105 and up to 110 degrees post treatment  Palpation: right LE: decreased soft tissue elasticity along incision, lateral quadriceps, decreased patella mobility inferiorly  Observation: mild swelling right knee as compared to left   Treatment:  Manual therapy:  20 min.: goal improve soft tissue elasticity, improve ROM right knee  patella mobilization with patella mobilizer with patient supine x 3 sets distraction and inferior glides  STM quadriceps right LE along incision and inferior to patella and gentle stretch x 3 reps with MFR techniques to quadriceps with patient sitting with right LE supported with improved soft tissue mobility > 50% and improved knee flexion to 110    Therapeutic exercise: patient performed with demonstration, instruction, verbal and tactile cues of therapist: goal: independent with home program, improve LEFS, ambulation without AD    Sitting:  AAROM knee flexion right knee  Isometric right knee flexion with manual resistance end range to facilitate knee flexion Right and left knee resistive flexion with green resistive band 2 x 15 reps: patient reported increased cramping in hamstring muscles bilaterally   side lying left: performed with right LE right hip abduction with assist of therapist for correct alignment  x 10 reps with LE over edge of treatment table; green resistive band around thighs clamshells  x 15 reps  with green resistive band around thighs  Modalities: moist heat: no charge: applied to bilateral hamstring muscles with patient prone lying x 15 min.: patient reported no feeling of cramping following treatment    Patient response to treatment: Patient not able to perform exercises: limited exercise due to cramping in right LE hamstring muscle with all exercises. Patient reported no cramping at end of session following moist heat treatment .        PT Education - 08/24/17 1530    Education provided  Yes     Education Details  exercise instruction; home insruction to rest, elevate LE     Person(s) Educated  Patient    Methods  Explanation    Comprehension  Verbalized understanding          PT Long Term Goals - 08/22/17 1000      PT LONG TERM GOAL #1   Title  Patient will be able to ambulate at least 500 ft without use of AD and no LOB to promote mobility.     Baseline  Pt currently ambulates with rw (04/12/2017); patient ambulating  with SPC with pain right and left LE knees and is using rolling walker as needed (05/29/17); 07/05/17 using c;ane for support; 07/24/2017 continues with cane for safety due to left knee pain    Status  Partially Met      PT LONG TERM GOAL #2   Title  Pt will improve seated R knee extension AROM to -8 degrees or less to promote ability to ambulate, perform standing tasks.     Baseline  seated R knee extension AROM -25 degrees (04/12/2017)    Status  Achieved      PT LONG TERM GOAL #3   Title  Pt will improve seated R knee flexion AROM to at least 115 degrees to promote ability to ambulate, negiotiate steps, perform transfers with less difficulty.     Baseline  seated R knee flexion AROM 77 degrees (04/12/2017); AAROM right knee flexion 0-  up to 100 degrees with pain limiting further motion (05/29/17); AROM up to 105 degrees/ AAROM 110     Status  On-going    Target Date  09/19/17      PT LONG TERM GOAL #4   Title  Pt will improve R knee flexion strength to at least 4+/5 and R knee extension strength to at least 5/5 to promote ability to ambulate, perform chores.     Baseline  right knee flexion 4-/5, knee extension improving 4-/5: (05/29/17): knee extension 4/5 with pain, knee flexion 4/5 with pain: 08/22/17: right knee flexion 4/5, extension 4/5     Status  On-going    Target Date  10/03/17      PT LONG TERM GOAL #5   Title  Patient will improve her LEFS score to 50 or more points as a demonstration of improved function with daily tasks.    Baseline  15/80 (04/12/2017);  LEFS 05/29/17 30/80; LEFS 07/05/17 27/80; 07/24/17 36/80; 08/22/17 36/80    Status  On-going    Target Date  10/03/17            Plan - 08/24/17 1600    Clinical Impression Statement  Patient was limited in ability to exercise today due to repeated cramping in hamstring muscles bilaterally. she improved pain/spasms with moist heat with no spasms at end of session. She will require additional physical therapy intervention to address strength, endurance and ROM in order to improve functional use right LE.   Rehab Potential  Fair    Clinical Impairments Affecting Rehab Potential  (-) healing time, pain; (+) husband support, acute condition, motivated    PT Frequency  2x / week    PT Duration  6 weeks    PT Treatment/Interventions  Neuromuscular re-education;Therapeutic exercise;Therapeutic activities;Manual techniques;Aquatic Therapy;Electrical Stimulation;Iontophoresis 87m/ml Dexamethasone;Gait training;Stair training;Balance training;Functional mobility training;Patient/family education;Scar mobilization;Passive range of motion;Dry needling    PT Next Visit Plan  ROM, gait, function, manual techniques, modalities PRN    PT Home Exercise Plan  ROM, standing hip abduction/extension; quad sets seated, knee flexion with resistive band; elevation/ use of ice for swelling/pain       Patient will benefit from skilled therapeutic intervention in order to improve the following deficits and impairments:  Pain, Difficulty walking, Decreased strength, Decreased scar mobility, Decreased range of motion  Visit Diagnosis: Acute pain of right knee  Muscle weakness (generalized)  Stiffness of right knee, not elsewhere classified  Difficulty in walking, not elsewhere classified     Problem List Patient Active Problem List   Diagnosis Date Noted  . S/P TKR (total  knee replacement) using cement, right 03/21/2017  . Primary osteoarthritis of right knee 11/02/2016  . Nausea without vomiting 09/01/2016   . Chronic diarrhea 12/07/2014  . Segmental colitis with rectal bleeding ([place]) 09/12/2014  . Routine general medical examination at a health care facility 06/05/2014  . Overweight 06/13/2011  . ABNORMAL CHEST XRAY 09/30/2007  . Hyperlipidemia 02/17/2007  . Essential hypertension 01/08/2007  . Osteoarthritis 01/08/2007    Jomarie Longs PT 08/25/2017, 7:14 PM  [facility] [address] [place], [address] Phone: [phone]   Fax:  [phone]  Name: LAKAYA TOLEN MRN: [id number] Date of Birth: [birth year]

## 2017-08-28 ENCOUNTER — Encounter: Payer: Self-pay | Admitting: Physical Therapy

## 2017-08-28 ENCOUNTER — Ambulatory Visit: Payer: Medicare Other | Admitting: Physical Therapy

## 2017-08-28 DIAGNOSIS — M25561 Pain in right knee: Secondary | ICD-10-CM | POA: Diagnosis not present

## 2017-08-28 DIAGNOSIS — M25661 Stiffness of right knee, not elsewhere classified: Secondary | ICD-10-CM

## 2017-08-28 DIAGNOSIS — R262 Difficulty in walking, not elsewhere classified: Secondary | ICD-10-CM | POA: Diagnosis not present

## 2017-08-28 DIAGNOSIS — M6281 Muscle weakness (generalized): Secondary | ICD-10-CM

## 2017-08-28 NOTE — Therapy (Signed)
[facility] [address], [place], [address] Phone: [phone]   Fax:  [phone]  Physical Therapy Treatment  Patient Details  Name: [name] MRN: [id number] Date of Birth: [birth year] Referring Provider: Joni Fears, MD   Encounter Date: 08/28/2017  PT End of Session - 08/28/17 1300    Visit Number  38    Number of Visits  48    Date for PT Re-Evaluation  10/03/17    PT Start Time  1147    PT Stop Time  1230    PT Time Calculation (min)  43 min    Equipment Utilized During Treatment  Other (comment) pt hurry cane    Activity Tolerance  Patient tolerated treatment well    Behavior During Therapy  [facility] for tasks assessed/performed       Past Medical History:  Diagnosis Date  . Arthritis    back, fingers with joint pain and swelling.  chronic back pain  . Chronic back pain    arthritis   . Diverticulosis   . Elevated cholesterol    takes Niacin daily and Simvastatin  . GERD (gastroesophageal reflux disease) 06/2004   non-specific gastritis on EGD 06/2004  . Headache(784.0)    occasionally  . History of colon polyps 2004, 2009   2004:adenomatous. 2009 hyperplastic.   Marland Kitchen History of hiatal hernia   . Hypertension    takes Tenoretic and Lisinopril daily  . Mucoid cyst of joint 08/2013   right index finger  . Osteopenia 01/2014   T score -1.1 FRAX 14%/0.5%. Stable from prior DEXA  . PONV (postoperative nausea and vomiting)   . Seasonal allergies   . Stroke ([place]) 1998   x 2 - mild left-sided weakness  . Urge incontinence   . Uterine prolapse     Past Surgical History:  Procedure Laterality Date  . BACK SURGERY     x 2  . COLONOSCOPY  2004, 2009, 2014  . FINGER SURGERY    . GUM SURGERY    . GYNECOLOGIC CRYOSURGERY    . HYSTEROSCOPY W/D&C  12/07/2010   with resection of endometrial polyp  . KNEE ARTHROSCOPY Left 2008  . lip biopsy     done at MD office Fri 11/22/13  . MASS  EXCISION Right 08/15/2013   Procedure: RIGHT INDEX EXCISION MASS ;  Surgeon: Tennis Must, MD;  Location: [place];  Service: Orthopedics;  Laterality: Right;  . NASAL SEPTUM SURGERY    . OOPHORECTOMY Right 2000  . SHOULDER ARTHROSCOPY WITH OPEN ROTATOR CUFF REPAIR AND DISTAL CLAVICLE ACROMINECTOMY Right 11/26/2013   Procedure: RIGHT SHOULDER ARTHROSCOPY WITH MINI OPEN ROTATOR CUFF REPAIR AND DISTAL CLAVICLE RESECTION, SUBACROMIAL DECOMPRESSION, POSSIBLE [facility] PATCH.;  Surgeon: [name], MD;  Location: [place];  Service: Orthopedics;  Laterality: Right;  . TOTAL KNEE ARTHROPLASTY Right 03/21/2017  . TOTAL KNEE ARTHROPLASTY Right 03/21/2017   Procedure: RIGHT TOTAL KNEE ARTHROPLASTY;  Surgeon: [name], MD;  Location: [place];  Service: Orthopedics;  Laterality: Right;  . TUBAL LIGATION      There were no vitals filed for this visit.  Subjective Assessment - 08/28/17 1257    Subjective  Patient reports she is resting more and this seems to be helping with pain in both knees.     Pertinent History  S/P R TKA on 03/21/2017. Pt had osteoarthritis and it was "bone on bone." Had 5 home health PT visits, and  a little bit of PT at the hospital. R knee hurts currently and feels tight.  PLOF: pt was using a SPC at times when her knee was acting up real bad but did not have to use her SPC all the time.      Limitations  Walking;Standing;House hold activities;Sitting    Patient Stated Goals  I just want to be able to get back to normal. (Has 5 dogs she has to take care of, do chores around the house, be active).     Currently in Pain?  Yes    Pain Score  3     Pain Location  Knee    Pain Orientation  Right    Pain Descriptors / Indicators  Tightness;Sore    Pain Type  Surgical pain 03/21/2017    Pain Onset  More than a month ago    Pain Frequency  Intermittent       Objective:  AAROM right knee flexion 0-108 and up to 110 degrees post treatment  Palpation: right  LE: decreased soft tissue elasticity along incision, lateral quadriceps, decreased patella mobility inferiorly  Gait: improved pattern with improved    Treatment:  Manual therapy:  23 min.: goal improve soft tissue elasticity, improve ROM right knee  patella mobilization with patella mobilizer with patient supine x 3 sets distraction and inferior glides  STM quadriceps right LE and along incision and inferior to patella and gentle stretch x 3 reps with MFR techniques to quadriceps with patient sitting with right LE supported    Therapeutic exercise: patient performed with demonstration, instruction, verbal and tactile cues of therapist: goal: independent with home program, improve LEFS, ambulation without AD    Sitting:  AAROM knee flexion right knee right knee extension with manual resistance x 10 reps   Isometric right knee flexion with manual resistance end range to facilitate knee flexion, 10 reps, 5 second holds   side lying left: performed with right LE right hip abduction with assist of therapist for correct alignment  2 x 10 reps green resistive band around thighs clamshells  2 x 15 reps with green resistive band around thighs  supine hook lying:  green band around thighs: hip flexion x 10 reps each LE: patient reported cramping in both hamstring muscles hamstring stretch performed to both LE's 3 reps with 30 second to one min. holds with assist of therapist; educated patient in performing at home in seated position  sit to stand off high treatment table with ball between knees x 5 reps (had to raise table to ~25" to allow no knee pain) instructed patient to perform with weights at home for muscle strengthening without knee pain   Patient response to treatment: Patient demonstrated improved technique with exercises with minimal cuing, assistance for correct alignment of hip/knee. Improved soft tissue elasticity right quadriceps, incision by 50% following STM.     PT Education -  08/28/17 1300    Education provided  Yes    Education Details  exercise instruction for sit to stand off high table and using weights for strengthening    Person(s) Educated  Patient    Methods  Explanation;Demonstration;Verbal cues    Comprehension  Verbalized understanding;Returned demonstration;Verbal cues required          PT Long Term Goals - 08/22/17 1000      PT LONG TERM GOAL #1   Title  Patient will be able to ambulate at least 500 ft without use of AD and no LOB to  promote mobility.     Baseline  Pt currently ambulates with rw (04/12/2017); patient ambulating with SPC with pain right and left LE knees and is using rolling walker as needed (05/29/17); 07/05/17 using c;ane for support; 07/24/2017 continues with cane for safety due to left knee pain    Status  Partially Met      PT LONG TERM GOAL #2   Title  Pt will improve seated R knee extension AROM to -8 degrees or less to promote ability to ambulate, perform standing tasks.     Baseline  seated R knee extension AROM -25 degrees (04/12/2017)    Status  Achieved      PT LONG TERM GOAL #3   Title  Pt will improve seated R knee flexion AROM to at least 115 degrees to promote ability to ambulate, negiotiate steps, perform transfers with less difficulty.     Baseline  seated R knee flexion AROM 77 degrees (04/12/2017); AAROM right knee flexion 0-  up to 100 degrees with pain limiting further motion (05/29/17); AROM up to 105 degrees/ AAROM 110     Status  On-going    Target Date  09/19/17      PT LONG TERM GOAL #4   Title  Pt will improve R knee flexion strength to at least 4+/5 and R knee extension strength to at least 5/5 to promote ability to ambulate, perform chores.     Baseline  right knee flexion 4-/5, knee extension improving 4-/5: (05/29/17): knee extension 4/5 with pain, knee flexion 4/5 with pain: 08/22/17: right knee flexion 4/5, extension 4/5     Status  On-going    Target Date  10/03/17      PT LONG TERM GOAL #5   Title   Patient will improve her LEFS score to 50 or more points as a demonstration of improved function with daily tasks.    Baseline  15/80 (04/12/2017); LEFS 05/29/17 30/80; LEFS 07/05/17 27/80; 07/24/17 36/80; 08/22/17 36/80    Status  On-going    Target Date  10/03/17            Plan - 08/28/17 1220    Clinical Impression Statement  Patient is progressing steadily with goals. She conitnues with limited ROM and decreased strength which contributes to decresaed functional use right LE and she will require additional physical therapy intervention to achieve goals.     Rehab Potential  Fair    Clinical Impairments Affecting Rehab Potential  (-) healing time, pain; (+) husband support, acute condition, motivated    PT Frequency  2x / week    PT Duration  6 weeks    PT Treatment/Interventions  Neuromuscular re-education;Therapeutic exercise;Therapeutic activities;Manual techniques;Aquatic Therapy;Electrical Stimulation;Iontophoresis 78m/ml Dexamethasone;Gait training;Stair training;Balance training;Functional mobility training;Patient/family education;Scar mobilization;Passive range of motion;Dry needling    PT Next Visit Plan  ROM, gait, function, manual techniques, modalities PRN    PT Home Exercise Plan  ROM, standing hip abduction/extension; quad sets seated, knee flexion with resistive band; elevation/ use of ice for swelling/pain       Patient will benefit from skilled therapeutic intervention in order to improve the following deficits and impairments:  Pain, Difficulty walking, Decreased strength, Decreased scar mobility, Decreased range of motion  Visit Diagnosis: Acute pain of right knee  Muscle weakness (generalized)  Stiffness of right knee, not elsewhere classified  Difficulty in walking, not elsewhere classified     Problem List Patient Active Problem List   Diagnosis Date Noted  . 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 Primary osteoarthritis of  right knee 11/02/2016  . Nausea without vomiting 09/01/2016  . Chronic diarrhea 12/07/2014  . Segmental colitis with rectal bleeding ([place]) 09/12/2014  . Routine general medical examination at a health care facility 06/05/2014  . Overweight 06/13/2011  . ABNORMAL CHEST XRAY 09/30/2007  . Hyperlipidemia 02/17/2007  . Essential hypertension 01/08/2007  . Osteoarthritis 01/08/2007    Jomarie Longs PT 08/28/2017, 11:26 PM  [facility] [address], [place], [address] Phone: [phone]   Fax:  [phone]  Name: [name] MRN: [id number] Date of Birth: [birth year]

## 2017-08-30 ENCOUNTER — Ambulatory Visit: Payer: Medicare Other | Admitting: Physical Therapy

## 2017-08-30 DIAGNOSIS — R262 Difficulty in walking, not elsewhere classified: Secondary | ICD-10-CM

## 2017-08-30 DIAGNOSIS — M25561 Pain in right knee: Secondary | ICD-10-CM | POA: Diagnosis not present

## 2017-08-30 DIAGNOSIS — M25661 Stiffness of right knee, not elsewhere classified: Secondary | ICD-10-CM

## 2017-08-30 DIAGNOSIS — M6281 Muscle weakness (generalized): Secondary | ICD-10-CM | POA: Diagnosis not present

## 2017-08-30 NOTE — Therapy (Signed)
[facility] [address] [place], [address] Phone: [phone]   Fax:  [phone]  Physical Therapy Treatment  Patient Details  Name: [name] MRN: [id number] Date of Birth: 15-Jan-1951 Referring Provider: Joni Fears, MD   Encounter Date: 08/30/2017  PT End of Session - 08/30/17 1233    Visit Number  39    Number of Visits  48    Date for PT Re-Evaluation  10/03/17    Authorization Type  9/10 progress note    PT Start Time  1150    PT Stop Time  1230    PT Time Calculation (min)  40 min    Equipment Utilized During Treatment  Other (comment) pt hurry cane    Activity Tolerance  Patient tolerated treatment well    Behavior During Therapy  [facility] for tasks assessed/performed       Past Medical History:  Diagnosis Date  . Arthritis    back, fingers with joint pain and swelling.  chronic back pain  . Chronic back pain    arthritis   . Diverticulosis   . Elevated cholesterol    takes Niacin daily and Simvastatin  . GERD (gastroesophageal reflux disease) 06/2004   non-specific gastritis on EGD 06/2004  . Headache(784.0)    occasionally  . History of colon polyps 2004, 2009   2004:adenomatous. 2009 hyperplastic.   Marland Kitchen History of hiatal hernia   . Hypertension    takes Tenoretic and Lisinopril daily  . Mucoid cyst of joint 08/2013   right index finger  . Osteopenia 01/2014   T score -1.1 FRAX 14%/0.5%. Stable from prior DEXA  . PONV (postoperative nausea and vomiting)   . Seasonal allergies   . Stroke ([place]) 1998   x 2 - mild left-sided weakness  . Urge incontinence   . Uterine prolapse     Past Surgical History:  Procedure Laterality Date  . BACK SURGERY     x 2  . COLONOSCOPY  2004, 2009, 2014  . FINGER SURGERY    . GUM SURGERY    . GYNECOLOGIC CRYOSURGERY    . HYSTEROSCOPY W/D&C  12/07/2010   with resection of endometrial polyp  . KNEE ARTHROSCOPY Left 2008  . lip biopsy      done at MD office Fri 11/22/13  . MASS EXCISION Right 08/15/2013   Procedure: RIGHT INDEX EXCISION MASS ;  Surgeon: Tennis Must, MD;  Location: [place];  Service: Orthopedics;  Laterality: Right;  . NASAL SEPTUM SURGERY    . OOPHORECTOMY Right 2000  . SHOULDER ARTHROSCOPY WITH OPEN ROTATOR CUFF REPAIR AND DISTAL CLAVICLE ACROMINECTOMY Right 11/26/2013   Procedure: RIGHT SHOULDER ARTHROSCOPY WITH MINI OPEN ROTATOR CUFF REPAIR AND DISTAL CLAVICLE RESECTION, SUBACROMIAL DECOMPRESSION, POSSIBLE [facility] PATCH.;  Surgeon: [name], MD;  Location: [place];  Service: Orthopedics;  Laterality: Right;  . TOTAL KNEE ARTHROPLASTY Right 03/21/2017  . TOTAL KNEE ARTHROPLASTY Right 03/21/2017   Procedure: RIGHT TOTAL KNEE ARTHROPLASTY;  Surgeon: [name], MD;  Location: [place];  Service: Orthopedics;  Laterality: Right;  . TUBAL LIGATION      There were no vitals filed for this visit.  Subjective Assessment - 08/30/17 1154    Subjective  Patient reoprts she is still progressing slowly with stiffness in right knee and swelling if up on it too much or with incresaed activity. Overall she is seeing imrpovement and feels therapy is helping.  Pertinent History  S/P R TKA on 03/21/2017. Pt had osteoarthritis and it was "bone on bone." Had 5 home health PT visits, and a little bit of PT at the hospital. R knee hurts currently and feels tight.  PLOF: pt was using a SPC at times when her knee was acting up real bad but did not have to use her SPC all the time.      Limitations  Walking;Standing;House hold activities;Sitting    Patient Stated Goals  I just want to be able to get back to normal. (Has 5 dogs she has to take care of, do chores around the house, be active).     Currently in Pain?  Yes    Pain Score  3     Pain Location  Knee    Pain Orientation  Right    Pain Descriptors / Indicators  Tightness;Sore    Pain Type  Surgical pain 03/21/2017    Pain Onset  More than a  month ago    Pain Frequency  Intermittent         Objective:  AAROM right knee flexion 0-108and up to 112degrees post treatment  Palpation:right LO:[id number] soft tissue elasticity along incision,lateral quadriceps,decreased patella mobility inferiorly Gait: improved hip/knee flexion, weight shift to right with stance   Treatment:  Manual therapy:62mn.: goal improve soft tissue elasticity, improve ROM right knee  patella mobilization with patella mobilizer with patient supine x3sets distraction and inferior glides  STM quadriceps right LE and along incision and inferior to patella and medial, superior aspect of patella and gentle stretch x 3 reps with MFR techniques to quadriceps with patient sitting with right LE supported   Therapeutic exercise: patient performed with demonstration, instruction, verbal and tactile cues of therapist: goal: independent with home program, improve LEFS, ambulation without AD   Sitting:  AAROM knee flexion right knee right knee extension with manual resistance x 10 reps   Isometric right knee flexion with manual resistance end range to facilitate knee flexion, 10 reps, 5 second holds  side lying left:performed with right LE right hip abduction with assist of therapist for correct alignment2 x 10reps green resistive band around thighs clamshells2 x 15 repswith green resistive band around thighs  Patient response to treatment:improved soft tissue elasticity 50% and patient able to perform knee flexion with less discomfort and pain in knee. No reported hamstring cramping today with exercises.     PT Education - 08/30/17 1226    Education provided  Yes    Education Details  re assessed home program for exercise, self management     Person(s) Educated  Patient    Methods  Explanation;Verbal cues    Comprehension  Verbalized understanding;Verbal cues required          PT Long Term Goals - 08/22/17 1000      PT LONG TERM  GOAL #1   Title  Patient will be able to ambulate at least 500 ft without use of AD and no LOB to promote mobility.     Baseline  Pt currently ambulates with rw (04/12/2017); patient ambulating with SPC with pain right and left LE knees and is using rolling walker as needed (05/29/17); 07/05/17 using c;ane for support; 07/24/2017 continues with cane for safety due to left knee pain    Status  Partially Met      PT LONG TERM GOAL #2   Title  Pt will improve seated R knee extension AROM to -8 degrees or less to promote  ability to ambulate, perform standing tasks.     Baseline  seated R knee extension AROM -25 degrees (04/12/2017)    Status  Achieved      PT LONG TERM GOAL #3   Title  Pt will improve seated R knee flexion AROM to at least 115 degrees to promote ability to ambulate, negiotiate steps, perform transfers with less difficulty.     Baseline  seated R knee flexion AROM 77 degrees (04/12/2017); AAROM right knee flexion 0-  up to 100 degrees with pain limiting further motion (05/29/17); AROM up to 105 degrees/ AAROM 110     Status  On-going    Target Date  09/19/17      PT LONG TERM GOAL #4   Title  Pt will improve R knee flexion strength to at least 4+/5 and R knee extension strength to at least 5/5 to promote ability to ambulate, perform chores.     Baseline  right knee flexion 4-/5, knee extension improving 4-/5: (05/29/17): knee extension 4/5 with pain, knee flexion 4/5 with pain: 08/22/17: right knee flexion 4/5, extension 4/5     Status  On-going    Target Date  10/03/17      PT LONG TERM GOAL #5   Title  Patient will improve her LEFS score to 50 or more points as a demonstration of improved function with daily tasks.    Baseline  15/80 (04/12/2017); LEFS 05/29/17 30/80; LEFS 07/05/17 27/80; 07/24/17 36/80; 08/22/17 36/80    Status  On-going    Target Date  10/03/17            Plan - 08/30/17 1235    Clinical Impression Statement  Patient is progressing steadily towards goals with  improvement in ROM and strength right LE with current physical therapy interventions. She should continue to improve ROM and function with additional treatment.     Rehab Potential  Fair    Clinical Impairments Affecting Rehab Potential  (-) healing time, pain; (+) husband support, acute condition, motivated    PT Frequency  2x / week    PT Duration  6 weeks    PT Treatment/Interventions  Neuromuscular re-education;Therapeutic exercise;Therapeutic activities;Manual techniques;Aquatic Therapy;Electrical Stimulation;Iontophoresis 56m/ml Dexamethasone;Gait training;Stair training;Balance training;Functional mobility training;Patient/family education;Scar mobilization;Passive range of motion;Dry needling    PT Next Visit Plan  ROM, gait, function, manual techniques, modalities PRN    PT Home Exercise Plan  ROM, standing hip abduction/extension; quad sets seated, knee flexion with resistive band; elevation/ use of ice for swelling/pain       Patient will benefit from skilled therapeutic intervention in order to improve the following deficits and impairments:  Pain, Difficulty walking, Decreased strength, Decreased scar mobility, Decreased range of motion  Visit Diagnosis: Acute pain of right knee  Muscle weakness (generalized)  Stiffness of right knee, not elsewhere classified  Difficulty in walking, not elsewhere classified     Problem List Patient Active Problem List   Diagnosis Date Noted  . S/P TKR (total knee replacement) using cement, right 03/21/2017  . Primary osteoarthritis of right knee 11/02/2016  . Nausea without vomiting 09/01/2016  . Chronic diarrhea 12/07/2014  . Segmental colitis with rectal bleeding ([place] 09/12/2014  . Routine general medical examination at a health care facility 06/05/2014  . Overweight 06/13/2011  . ABNORMAL CHEST XRAY 09/30/2007  . Hyperlipidemia 02/17/2007  . Essential hypertension 01/08/2007  . Osteoarthritis 01/08/2007    BJomarie Longs PT 08/31/2017, 11:46 AM  [facility] [address] [place], [address] Phone: [phone]   Fax:  [phone]  Name: [name] MRN: [id number] Date of Birth: 05-16-1950

## 2017-09-06 ENCOUNTER — Encounter: Payer: Self-pay | Admitting: Physical Therapy

## 2017-09-06 ENCOUNTER — Ambulatory Visit: Payer: Medicare Other | Admitting: Physical Therapy

## 2017-09-06 DIAGNOSIS — R262 Difficulty in walking, not elsewhere classified: Secondary | ICD-10-CM | POA: Diagnosis not present

## 2017-09-06 DIAGNOSIS — M25661 Stiffness of right knee, not elsewhere classified: Secondary | ICD-10-CM | POA: Diagnosis not present

## 2017-09-06 DIAGNOSIS — M25561 Pain in right knee: Secondary | ICD-10-CM | POA: Diagnosis not present

## 2017-09-06 DIAGNOSIS — M6281 Muscle weakness (generalized): Secondary | ICD-10-CM

## 2017-09-06 NOTE — Therapy (Signed)
[facility] [address], [place], [address] Phone: [phone]   Fax:  [phone]  Physical Therapy Treatment Physical Therapy Progress Note   Dates of reporting period 07/27/2017 to 09/06/2017   Patient Details  Name: [name] MRN: [id number] Date of Birth: [birth year] Referring Provider: Joni Fears, MD   Encounter Date: 09/06/2017  PT End of Session - 09/06/17 1155    Visit Number  40    Number of Visits  48    Date for PT Re-Evaluation  10/03/17    Authorization Type  10/10 progress note    PT Start Time  1153    PT Stop Time  1232    PT Time Calculation (min)  39 min    Equipment Utilized During Treatment  Other (comment) pt hurry cane    Activity Tolerance  Patient tolerated treatment well    Behavior During Therapy  [facility] for tasks assessed/performed       Past Medical History:  Diagnosis Date  . Arthritis    back, fingers with joint pain and swelling.  chronic back pain  . Chronic back pain    arthritis   . Diverticulosis   . Elevated cholesterol    takes Niacin daily and Simvastatin  . GERD (gastroesophageal reflux disease) 06/2004   non-specific gastritis on EGD 06/2004  . Headache(784.0)    occasionally  . History of colon polyps 2004, 2009   2004:adenomatous. 2009 hyperplastic.   Marland Kitchen History of hiatal hernia   . Hypertension    takes Tenoretic and Lisinopril daily  . Mucoid cyst of joint 08/2013   right index finger  . Osteopenia 01/2014   T score -1.1 FRAX 14%/0.5%. Stable from prior DEXA  . PONV (postoperative nausea and vomiting)   . Seasonal allergies   . Stroke ([place]) 1998   x 2 - mild left-sided weakness  . Urge incontinence   . Uterine prolapse     Past Surgical History:  Procedure Laterality Date  . BACK SURGERY     x 2  . COLONOSCOPY  2004, 2009, 2014  . FINGER SURGERY    . GUM SURGERY    . GYNECOLOGIC CRYOSURGERY    . HYSTEROSCOPY W/D&C  12/07/2010   with resection of endometrial polyp  . KNEE ARTHROSCOPY Left 2008  . lip biopsy     done at MD office Fri 11/22/13  . MASS EXCISION Right 08/15/2013   Procedure: RIGHT INDEX EXCISION MASS ;  Surgeon: Tennis Must, MD;  Location: [place];  Service: Orthopedics;  Laterality: Right;  . NASAL SEPTUM SURGERY    . OOPHORECTOMY Right 2000  . SHOULDER ARTHROSCOPY WITH OPEN ROTATOR CUFF REPAIR AND DISTAL CLAVICLE ACROMINECTOMY Right 11/26/2013   Procedure: RIGHT SHOULDER ARTHROSCOPY WITH MINI OPEN ROTATOR CUFF REPAIR AND DISTAL CLAVICLE RESECTION, SUBACROMIAL DECOMPRESSION, POSSIBLE [facility] PATCH.;  Surgeon: [name], MD;  Location: [place];  Service: Orthopedics;  Laterality: Right;  . TOTAL KNEE ARTHROPLASTY Right 03/21/2017  . TOTAL KNEE ARTHROPLASTY Right 03/21/2017   Procedure: RIGHT TOTAL KNEE ARTHROPLASTY;  Surgeon: [name], MD;  Location: [name];  Service: Orthopedics;  Laterality: Right;  . TUBAL LIGATION      There were no vitals filed for this visit.  Subjective Assessment - 09/06/17 1200    Subjective  Patient reports she has stiffness in her right knee and is having pain in left knee that is limiting function with walking and  daily tasks.     Pertinent History  S/P R TKA on 03/21/2017. Pt had osteoarthritis and it was "bone on bone." Had 5 home health PT visits, and a little bit of PT at the hospital. R knee hurts currently and feels tight.  PLOF: pt was using a SPC at times when her knee was acting up real bad but did not have to use her SPC all the time.      Limitations  Walking;Standing;House hold activities;Sitting    Patient Stated Goals  I just want to be able to get back to normal. (Has 5 dogs she has to take care of, do chores around the house, be active).     Currently in Pain?  Yes    Pain Score  4     Pain Location  Knee    Pain Orientation  Right    Pain Descriptors / Indicators  Tightness;Sore    Pain Type  Surgical pain 03/21/2017     Pain Onset  More than a month ago    Pain Frequency  Intermittent        Objective:  AAROM right knee flexion 0-108 and up to 112 degrees post treatment  Palpation: right LE: decreased soft tissue elasticity along incision, lateral quadriceps, decreased patella mobility inferiorly  Gait: improved hip/knee flexion, weight shift to right with stance, antalgic pattern Strength: right hip abduction 3+/5, ER 4-/5 Outcome measure: LEFS (08/22/17) 36/80    Treatment:  Manual therapy:  23 min.: goal improve soft tissue elasticity, improve ROM right knee  patella mobilization with patella mobilizer with patient supine x 3 sets distraction and inferior glides  STM quadriceps right LE and along incision and inferior to patella and medial, superior aspect of patella and gentle stretch x 3 reps with MFR techniques to quadriceps with patient sitting with right LE supported    Therapeutic exercise: patient performed with demonstration, instruction, verbal and tactile cues of therapist: goal: independent with home program, improve LEFS, ambulation without AD    Sitting:  AAROM knee flexion right knee with stretch end range right knee extension with manual resistance x 10 reps   Isometric right knee flexion with manual resistance end range to facilitate knee flexion, 10 reps, 5 second holds   side lying left: performed with right LE right hip abduction with assist of therapist for correct alignment  2 x 10 reps green resistive band around thighs Therapist assisted hip flexor stretch and abductor stretch x 3 reps each   Instructed in knee flexion stretch in standing/on chair or stool   Patient response to treatment: improved soft tissue elasticity 50% and patient able to perform knee flexion with less discomfort and pain in knee.         PT Education - 09/06/17 1211    Education provided  Yes    Education Details  exercise instruction for knee flexor stretch, progress with therapy   Person(s)  Educated  Patient    Methods  Explanation;Verbal cues;Demonstration    Comprehension  Verbalized understanding;Verbal cues required          PT Long Term Goals - 08/22/17 1000      PT LONG TERM GOAL #1   Title  Patient will be able to ambulate at least 500 ft without use of AD and no LOB to promote mobility.     Baseline  Pt currently ambulates with rw (04/12/2017); patient ambulating with SPC with pain right and left LE knees and is using rolling  walker as needed (05/29/17); 07/05/17 using c;ane for support; 07/24/2017 continues with cane for safety due to left knee pain    Status  Partially Met      PT LONG TERM GOAL #2   Title  Pt will improve seated R knee extension AROM to -8 degrees or less to promote ability to ambulate, perform standing tasks.     Baseline  seated R knee extension AROM -25 degrees (04/12/2017)    Status  Achieved      PT LONG TERM GOAL #3   Title  Pt will improve seated R knee flexion AROM to at least 115 degrees to promote ability to ambulate, negiotiate steps, perform transfers with less difficulty.     Baseline  seated R knee flexion AROM 77 degrees (04/12/2017); AAROM right knee flexion 0-  up to 100 degrees with pain limiting further motion (05/29/17); AROM up to 105 degrees/ AAROM 110     Status  On-going    Target Date  09/19/17      PT LONG TERM GOAL #4   Title  Pt will improve R knee flexion strength to at least 4+/5 and R knee extension strength to at least 5/5 to promote ability to ambulate, perform chores.     Baseline  right knee flexion 4-/5, knee extension improving 4-/5: (05/29/17): knee extension 4/5 with pain, knee flexion 4/5 with pain: 08/22/17: right knee flexion 4/5, extension 4/5     Status  On-going    Target Date  10/03/17      PT LONG TERM GOAL #5   Title  Patient will improve her LEFS score to 50 or more points as a demonstration of improved function with daily tasks.    Baseline  15/80 (04/12/2017); LEFS 05/29/17 30/80; LEFS 07/05/17 27/80;  07/24/17 36/80; 08/22/17 36/80    Status  On-going    Target Date  10/03/17            Plan - 09/06/17 1238    Clinical Impression Statement  Patient continues to demonstrate improvement with ROM and function right knee following TKA. She conitnues with decreased AROM and right hip strength and will require addiitonal physical therapy intervention to achieve maximal functional return.     Rehab Potential  Fair    Clinical Impairments Affecting Rehab Potential  (-) healing time, pain; (+) husband support, acute condition, motivated    PT Frequency  2x / week    PT Duration  6 weeks    PT Treatment/Interventions  Neuromuscular re-education;Therapeutic exercise;Therapeutic activities;Manual techniques;Aquatic Therapy;Electrical Stimulation;Iontophoresis 16m/ml Dexamethasone;Gait training;Stair training;Balance training;Functional mobility training;Patient/family education;Scar mobilization;Passive range of motion;Dry needling    PT Next Visit Plan  ROM, gait, function, manual techniques, modalities PRN    PT Home Exercise Plan  ROM, standing hip abduction/extension; quad sets seated, knee flexion with resistive band; elevation/ use of ice for swelling/pain       Patient will benefit from skilled therapeutic intervention in order to improve the following deficits and impairments:  Pain, Difficulty walking, Decreased strength, Decreased scar mobility, Decreased range of motion  Visit Diagnosis: Acute pain of right knee  Muscle weakness (generalized)  Stiffness of right knee, not elsewhere classified  Difficulty in walking, not elsewhere classified     Problem List Patient Active Problem List   Diagnosis Date Noted  . S/P TKR (total knee replacement) using cement, right 03/21/2017  . Primary osteoarthritis of right knee 11/02/2016  . Nausea without vomiting 09/01/2016  . Chronic diarrhea 12/07/2014  .  Segmental colitis with rectal bleeding ([place]) 09/12/2014  . Routine general  medical examination at a health care facility 06/05/2014  . Overweight 06/13/2011  . ABNORMAL CHEST XRAY 09/30/2007  . Hyperlipidemia 02/17/2007  . Essential hypertension 01/08/2007  . Osteoarthritis 01/08/2007    Jomarie Longs PT 09/06/2017, 10:34 PM  [facility] [address], [place], [address] Phone: [phone]   Fax:  [phone]  Name: [name] MRN: [id number] Date of Birth: 1950-09-27

## 2017-09-11 ENCOUNTER — Encounter: Payer: Self-pay | Admitting: Physical Therapy

## 2017-09-11 ENCOUNTER — Ambulatory Visit: Payer: Medicare Other | Attending: Orthopaedic Surgery | Admitting: Physical Therapy

## 2017-09-11 DIAGNOSIS — R262 Difficulty in walking, not elsewhere classified: Secondary | ICD-10-CM | POA: Diagnosis not present

## 2017-09-11 DIAGNOSIS — M6281 Muscle weakness (generalized): Secondary | ICD-10-CM | POA: Insufficient documentation

## 2017-09-11 DIAGNOSIS — M25561 Pain in right knee: Secondary | ICD-10-CM | POA: Diagnosis not present

## 2017-09-11 DIAGNOSIS — M25661 Stiffness of right knee, not elsewhere classified: Secondary | ICD-10-CM | POA: Insufficient documentation

## 2017-09-11 NOTE — Therapy (Signed)
[facility] [address], [place], [address] Phone: [phone]   Fax:  [phone]  Physical Therapy Treatment  Patient Details  Name: [name] MRN: [id number] Date of Birth: 06-Feb-1951 Referring Provider: Joni Fears, MD   Encounter Date: 09/11/2017  PT End of Session - 09/11/17 1541    Visit Number  41    Number of Visits  48    Date for PT Re-Evaluation  10/03/17    Authorization Type  1/10 progress note    PT Start Time  1538    PT Stop Time  1616    PT Time Calculation (min)  38 min    Equipment Utilized During Treatment  Other (comment) pt hurry cane    Activity Tolerance  Patient tolerated treatment well    Behavior During Therapy  [facility] for tasks assessed/performed       Past Medical History:  Diagnosis Date  . Arthritis    back, fingers with joint pain and swelling.  chronic back pain  . Chronic back pain    arthritis   . Diverticulosis   . Elevated cholesterol    takes Niacin daily and Simvastatin  . GERD (gastroesophageal reflux disease) 06/2004   non-specific gastritis on EGD 06/2004  . Headache(784.0)    occasionally  . History of colon polyps 2004, 2009   2004:adenomatous. 2009 hyperplastic.   Marland Kitchen History of hiatal hernia   . Hypertension    takes Tenoretic and Lisinopril daily  . Mucoid cyst of joint 08/2013   right index finger  . Osteopenia 01/2014   T score -1.1 FRAX 14%/0.5%. Stable from prior DEXA  . PONV (postoperative nausea and vomiting)   . Seasonal allergies   . Stroke ([place]) 1998   x 2 - mild left-sided weakness  . Urge incontinence   . Uterine prolapse     Past Surgical History:  Procedure Laterality Date  . BACK SURGERY     x 2  . COLONOSCOPY  2004, 2009, 2014  . FINGER SURGERY    . GUM SURGERY    . GYNECOLOGIC CRYOSURGERY    . HYSTEROSCOPY W/D&C  12/07/2010   with resection of endometrial polyp  . KNEE ARTHROSCOPY Left 2008  . lip biopsy     done at MD office Fri 11/22/13  . MASS EXCISION Right 08/15/2013   Procedure: RIGHT INDEX EXCISION MASS ;  Surgeon: Tennis Must, MD;  Location: [place];  Service: Orthopedics;  Laterality: Right;  . NASAL SEPTUM SURGERY    . OOPHORECTOMY Right 2000  . SHOULDER ARTHROSCOPY WITH OPEN ROTATOR CUFF REPAIR AND DISTAL CLAVICLE ACROMINECTOMY Right 11/26/2013   Procedure: RIGHT SHOULDER ARTHROSCOPY WITH MINI OPEN ROTATOR CUFF REPAIR AND DISTAL CLAVICLE RESECTION, SUBACROMIAL DECOMPRESSION, POSSIBLE [facility] PATCH.;  Surgeon: [name], MD;  Location: [place];  Service: Orthopedics;  Laterality: Right;  . TOTAL KNEE ARTHROPLASTY Right 03/21/2017  . TOTAL KNEE ARTHROPLASTY Right 03/21/2017   Procedure: RIGHT TOTAL KNEE ARTHROPLASTY;  Surgeon: [name], MD;  Location: [place];  Service: Orthopedics;  Laterality: Right;  . TUBAL LIGATION      There were no vitals filed for this visit.  Subjective Assessment - 09/11/17 1540    Subjective  Patient reports she continues with pain in left knee and siffness in right knee.     Pertinent History  S/P R TKA on 03/21/2017. Pt had osteoarthritis and it was "bone on bone." Had 5  home health PT visits, and a little bit of PT at the hospital. R knee hurts currently and feels tight.  PLOF: pt was using a SPC at times when her knee was acting up real bad but did not have to use her SPC all the time.      Limitations  Walking;Standing;House hold activities;Sitting    Patient Stated Goals  I just want to be able to get back to normal. (Has 5 dogs she has to take care of, do chores around the house, be active).     Currently in Pain?  Yes    Pain Score  4     Pain Location  Knee    Pain Orientation  Right    Pain Descriptors / Indicators  Aching;Tightness    Pain Type  Surgical pain 03/21/2017    Pain Onset  More than a month ago    Pain Frequency  Intermittent           Objective:  AAROM right knee flexion 0-108 and up to 112  degrees post treatment  Palpation: right LE: decreased soft tissue elasticity along incision, lateral quadriceps, decreased patella mobility inferiorly    Treatment:  Manual therapy:  23 min.: goal improve soft tissue elasticity, improve ROM right knee  patella mobilization with patella mobilizer with patient supine x 3 sets distraction and inferior glides  STM quadriceps right LE and along incision and inferior to patella and medial, superior aspect of patella and gentle stretch x 3 reps with MFR techniques to quadriceps with patient sitting with right LE supported    Therapeutic exercise: patient performed with demonstration, instruction, verbal and tactile cues of therapist: goal: independent with home program, improve LEFS, ambulation without AD    Sitting:  AAROM knee flexion right knee with stretch end range right knee extension with manual resistance x 10 reps   Isometric right knee flexion with manual resistance end range to facilitate knee flexion, 10 reps, 5 second holds   side lying left: performed with right LE Hip clamshells with graded manual resistance 2 x 15 reps right hip abduction with assist of therapist for correct alignment  2 x 10 reps  Therapist assisted hip flexor stretch and abductor stretch x 3 reps each    Patient response to treatment: improved soft tissue elasticity 50% following STM and improved flexibility into knee flexion with sell difficulty. Patient required assistance for correct alignment of hip/knee with side lying exercises      PT Education - 09/11/17 1541    Education provided  Yes    Education Details  exercise instruction    Person(s) Educated  Patient    Methods  Explanation;Demonstration;Verbal cues    Comprehension  Verbalized understanding;Returned demonstration;Verbal cues required          PT Long Term Goals - 08/22/17 1000      PT LONG TERM GOAL #1   Title  Patient will be able to ambulate at least 500 ft without use of AD and  no LOB to promote mobility.     Baseline  Pt currently ambulates with rw (04/12/2017); patient ambulating with SPC with pain right and left LE knees and is using rolling walker as needed (05/29/17); 07/05/17 using c;ane for support; 07/24/2017 continues with cane for safety due to left knee pain    Status  Partially Met      PT LONG TERM GOAL #2   Title  Pt will improve seated R knee extension AROM to -8  degrees or less to promote ability to ambulate, perform standing tasks.     Baseline  seated R knee extension AROM -25 degrees (04/12/2017)    Status  Achieved      PT LONG TERM GOAL #3   Title  Pt will improve seated R knee flexion AROM to at least 115 degrees to promote ability to ambulate, negiotiate steps, perform transfers with less difficulty.     Baseline  seated R knee flexion AROM 77 degrees (04/12/2017); AAROM right knee flexion 0-  up to 100 degrees with pain limiting further motion (05/29/17); AROM up to 105 degrees/ AAROM 110     Status  On-going    Target Date  09/19/17      PT LONG TERM GOAL #4   Title  Pt will improve R knee flexion strength to at least 4+/5 and R knee extension strength to at least 5/5 to promote ability to ambulate, perform chores.     Baseline  right knee flexion 4-/5, knee extension improving 4-/5: (05/29/17): knee extension 4/5 with pain, knee flexion 4/5 with pain: 08/22/17: right knee flexion 4/5, extension 4/5     Status  On-going    Target Date  10/03/17      PT LONG TERM GOAL #5   Title  Patient will improve her LEFS score to 50 or more points as a demonstration of improved function with daily tasks.    Baseline  15/80 (04/12/2017); LEFS 05/29/17 30/80; LEFS 07/05/17 27/80; 07/24/17 36/80; 08/22/17 36/80    Status  On-going    Target Date  10/03/17            Plan - 09/11/17 1623    Clinical Impression Statement  Patient is improving ROM and strength right LE s/p TKA. She continues with limited function due to decreased ROM and strengt hright LE an should  continue to progress with additional physical therapy intervention.     Rehab Potential  Fair    Clinical Impairments Affecting Rehab Potential  (-) healing time, pain; (+) husband support, acute condition, motivated    PT Frequency  2x / week    PT Duration  6 weeks    PT Treatment/Interventions  Neuromuscular re-education;Therapeutic exercise;Therapeutic activities;Manual techniques;Aquatic Therapy;Electrical Stimulation;Iontophoresis 52m/ml Dexamethasone;Gait training;Stair training;Balance training;Functional mobility training;Patient/family education;Scar mobilization;Passive range of motion;Dry needling    PT Next Visit Plan  ROM, gait, function, manual techniques, modalities PRN    PT Home Exercise Plan  ROM, standing hip abduction/extension; quad sets seated, knee flexion with resistive band; elevation/ use of ice for swelling/pain       Patient will benefit from skilled therapeutic intervention in order to improve the following deficits and impairments:  Pain, Difficulty walking, Decreased strength, Decreased scar mobility, Decreased range of motion  Visit Diagnosis: Acute pain of right knee  Muscle weakness (generalized)  Stiffness of right knee, not elsewhere classified  Difficulty in walking, not elsewhere classified     Problem List Patient Active Problem List   Diagnosis Date Noted  . S/P TKR (total knee replacement) using cement, right 03/21/2017  . Primary osteoarthritis of right knee 11/02/2016  . Nausea without vomiting 09/01/2016  . Chronic diarrhea 12/07/2014  . Segmental colitis with rectal bleeding ([place] 09/12/2014  . Routine general medical examination at a health care facility 06/05/2014  . Overweight 06/13/2011  . ABNORMAL CHEST XRAY 09/30/2007  . Hyperlipidemia 02/17/2007  . Essential hypertension 01/08/2007  . Osteoarthritis 01/08/2007    BJomarie LongsPT 09/11/2017, 10:35 PM  [facility] [address] [place], [address] Phone: [phone]   Fax:  [phone]  Name: [name] MRN: [id number] Date of Birth: May 24, 1950

## 2017-09-12 DIAGNOSIS — H2511 Age-related nuclear cataract, right eye: Secondary | ICD-10-CM | POA: Diagnosis not present

## 2017-09-12 DIAGNOSIS — H2512 Age-related nuclear cataract, left eye: Secondary | ICD-10-CM | POA: Diagnosis not present

## 2017-09-12 DIAGNOSIS — H02831 Dermatochalasis of right upper eyelid: Secondary | ICD-10-CM | POA: Diagnosis not present

## 2017-09-12 DIAGNOSIS — Z01818 Encounter for other preprocedural examination: Secondary | ICD-10-CM | POA: Diagnosis not present

## 2017-09-13 ENCOUNTER — Ambulatory Visit: Payer: Medicare Other | Admitting: Physical Therapy

## 2017-09-13 ENCOUNTER — Encounter: Payer: Self-pay | Admitting: Physical Therapy

## 2017-09-13 DIAGNOSIS — M25561 Pain in right knee: Secondary | ICD-10-CM | POA: Diagnosis not present

## 2017-09-13 DIAGNOSIS — M6281 Muscle weakness (generalized): Secondary | ICD-10-CM | POA: Diagnosis not present

## 2017-09-13 DIAGNOSIS — M25661 Stiffness of right knee, not elsewhere classified: Secondary | ICD-10-CM | POA: Diagnosis not present

## 2017-09-13 DIAGNOSIS — R262 Difficulty in walking, not elsewhere classified: Secondary | ICD-10-CM

## 2017-09-13 NOTE — Therapy (Signed)
[facility] [address] [place], [address] Phone: [phone]   Fax:  [phone]  Physical Therapy Treatment  Patient Details  Name: [name] MRN: [id number] Date of Birth: 1950-09-12 Referring Provider: Joni Fears, MD   Encounter Date: 09/13/2017  PT End of Session - 09/13/17 1443    Visit Number  42    Number of Visits  48    Date for PT Re-Evaluation  10/03/17    Authorization Type  2/10 progress note    PT Start Time  1350    PT Stop Time  1430    PT Time Calculation (min)  40 min    Equipment Utilized During Treatment  Other (comment) pt hurry cane    Activity Tolerance  Patient tolerated treatment well    Behavior During Therapy  [facility] for tasks assessed/performed       Past Medical History:  Diagnosis Date  . Arthritis    back, fingers with joint pain and swelling.  chronic back pain  . Chronic back pain    arthritis   . Diverticulosis   . Elevated cholesterol    takes Niacin daily and Simvastatin  . GERD (gastroesophageal reflux disease) 06/2004   non-specific gastritis on EGD 06/2004  . Headache(784.0)    occasionally  . History of colon polyps 2004, 2009   2004:adenomatous. 2009 hyperplastic.   Marland Kitchen History of hiatal hernia   . Hypertension    takes Tenoretic and Lisinopril daily  . Mucoid cyst of joint 08/2013   right index finger  . Osteopenia 01/2014   T score -1.1 FRAX 14%/0.5%. Stable from prior DEXA  . PONV (postoperative nausea and vomiting)   . Seasonal allergies   . Stroke ([place]) 1998   x 2 - mild left-sided weakness  . Urge incontinence   . Uterine prolapse     Past Surgical History:  Procedure Laterality Date  . BACK SURGERY     x 2  . COLONOSCOPY  2004, 2009, 2014  . FINGER SURGERY    . GUM SURGERY    . GYNECOLOGIC CRYOSURGERY    . HYSTEROSCOPY W/D&C  12/07/2010   with resection of endometrial polyp  . KNEE ARTHROSCOPY Left 2008  . lip biopsy     done at MD office Fri 11/22/13  . MASS EXCISION Right 08/15/2013   Procedure: RIGHT INDEX EXCISION MASS ;  Surgeon: Tennis Must, MD;  Location: [place];  Service: Orthopedics;  Laterality: Right;  . NASAL SEPTUM SURGERY    . OOPHORECTOMY Right 2000  . SHOULDER ARTHROSCOPY WITH OPEN ROTATOR CUFF REPAIR AND DISTAL CLAVICLE ACROMINECTOMY Right 11/26/2013   Procedure: RIGHT SHOULDER ARTHROSCOPY WITH MINI OPEN ROTATOR CUFF REPAIR AND DISTAL CLAVICLE RESECTION, SUBACROMIAL DECOMPRESSION, POSSIBLE [facility] PATCH.;  Surgeon: [name], MD;  Location: [place];  Service: Orthopedics;  Laterality: Right;  . TOTAL KNEE ARTHROPLASTY Right 03/21/2017  . TOTAL KNEE ARTHROPLASTY Right 03/21/2017   Procedure: RIGHT TOTAL KNEE ARTHROPLASTY;  Surgeon: [name], MD;  Location: [place];  Service: Orthopedics;  Laterality: Right;  . TUBAL LIGATION      There were no vitals filed for this visit.  Subjective Assessment - 09/13/17 1352    Subjective  Patient reports she is working on soft tissue at home and feels she is progressing slowly.     Pertinent History  S/P R TKA on 03/21/2017. Pt had osteoarthritis and it was "bone on bone."  Had 5 home health PT visits, and a little bit of PT at the hospital. R knee hurts currently and feels tight.  PLOF: pt was using a SPC at times when her knee was acting up real bad but did not have to use her SPC all the time.      Limitations  Walking;Standing;House hold activities;Sitting    Patient Stated Goals  I just want to be able to get back to normal. (Has 5 dogs she has to take care of, do chores around the house, be active).     Currently in Pain?  Yes    Pain Score  4     Pain Location  Knee    Pain Orientation  Right    Pain Descriptors / Indicators  Aching;Tightness    Pain Type  Surgical pain 03/21/2017    Pain Onset  More than a month ago    Pain Frequency  Intermittent        Objective:  AAROM right knee flexion 0-108 and up to 115  with stretch end range post treatment  Palpation: right LE: decreased soft tissue elasticity along incision, lateral quadriceps, decreased patella mobility inferiorly    Treatment:  Manual therapy:  26 min.: goal improve soft tissue elasticity, improve ROM right knee  patella mobilization with patella mobilizer with patient supine x 3 sets distraction and inferior glides  STM quadriceps right LE and along incision and inferior to patella and medial, superior aspect of patella and gentle stretch x 3 reps with MFR techniques to quadriceps with patient sitting with right LE supported    Therapeutic exercise: patient performed with demonstration, instruction, verbal and tactile cues of therapist: goal: independent with home program, improve LEFS, ambulation without AD    Sitting:  AAROM knee flexion right knee with stretch end range Isometric right knee flexion with manual resistance end range to facilitate knee flexion, 10 reps, 5 second holds   side lying left: performed with right LE Hip clamshells with graded manual resistance 2 x 15 reps right hip abduction with assist of therapist for correct alignment  2 x 10 reps  Therapist assisted hip flexor stretch and abductor stretch x 3 reps each    Patient response to treatment: Patient demonstrated improved soft tissue elasticity and ROM by 5 degrees following STM, exercise. Patient required assistance and moderate tactile and VC to perform exercises with good alignment/technique         PT Education - 09/13/17 1420    Education provided  Yes    Education Details  HEP re assessed for self STM, exercises for ROM, strength    Person(s) Educated  Patient    Methods  Explanation;Demonstration;Verbal cues    Comprehension  Verbalized understanding;Returned demonstration;Verbal cues required          PT Long Term Goals - 08/22/17 1000      PT LONG TERM GOAL #1   Title  Patient will be able to ambulate at least 500 ft without use of AD  and no LOB to promote mobility.     Baseline  Pt currently ambulates with rw (04/12/2017); patient ambulating with SPC with pain right and left LE knees and is using rolling walker as needed (05/29/17); 07/05/17 using c;ane for support; 07/24/2017 continues with cane for safety due to left knee pain    Status  Partially Met      PT LONG TERM GOAL #2   Title  Pt will improve seated R knee extension AROM  to -8 degrees or less to promote ability to ambulate, perform standing tasks.     Baseline  seated R knee extension AROM -25 degrees (04/12/2017)    Status  Achieved      PT LONG TERM GOAL #3   Title  Pt will improve seated R knee flexion AROM to at least 115 degrees to promote ability to ambulate, negiotiate steps, perform transfers with less difficulty.     Baseline  seated R knee flexion AROM 77 degrees (04/12/2017); AAROM right knee flexion 0-  up to 100 degrees with pain limiting further motion (05/29/17); AROM up to 105 degrees/ AAROM 110     Status  On-going    Target Date  09/19/17      PT LONG TERM GOAL #4   Title  Pt will improve R knee flexion strength to at least 4+/5 and R knee extension strength to at least 5/5 to promote ability to ambulate, perform chores.     Baseline  right knee flexion 4-/5, knee extension improving 4-/5: (05/29/17): knee extension 4/5 with pain, knee flexion 4/5 with pain: 08/22/17: right knee flexion 4/5, extension 4/5     Status  On-going    Target Date  10/03/17      PT LONG TERM GOAL #5   Title  Patient will improve her LEFS score to 50 or more points as a demonstration of improved function with daily tasks.    Baseline  15/80 (04/12/2017); LEFS 05/29/17 30/80; LEFS 07/05/17 27/80; 07/24/17 36/80; 08/22/17 36/80    Status  On-going    Target Date  10/03/17            Plan - 09/13/17 1438    Clinical Impression Statement  Patient continues to demonstrate improvement with ROM and strength right LE with current treatment. She continues with pain in left LE and  stiffness right knee that limit full function with ambulation outside the home and she will benefit from continued physical therapy intervention to further strengthen right LE and improve ROM righ knee to maximize function.     Rehab Potential  Fair    Clinical Impairments Affecting Rehab Potential  (-) healing time, pain; (+) husband support, acute condition, motivated    PT Frequency  2x / week    PT Duration  6 weeks    PT Treatment/Interventions  Neuromuscular re-education;Therapeutic exercise;Therapeutic activities;Manual techniques;Aquatic Therapy;Electrical Stimulation;Iontophoresis 22m/ml Dexamethasone;Gait training;Stair training;Balance training;Functional mobility training;Patient/family education;Scar mobilization;Passive range of motion;Dry needling    PT Next Visit Plan  ROM, gait, function, manual techniques, modalities PRN; re assess goals   PT Home Exercise Plan  ROM, standing hip abduction/extension; quad sets seated, knee flexion with resistive band; elevation/ use of ice for swelling/pain; supine and side lying exercises for hip strengthening       Patient will benefit from skilled therapeutic intervention in order to improve the following deficits and impairments:  Pain, Difficulty walking, Decreased strength, Decreased scar mobility, Decreased range of motion  Visit Diagnosis: Acute pain of right knee  Muscle weakness (generalized)  Stiffness of right knee, not elsewhere classified  Difficulty in walking, not elsewhere classified     Problem List Patient Active Problem List   Diagnosis Date Noted  . S/P TKR (total knee replacement) using cement, right 03/21/2017  . Primary osteoarthritis of right knee 11/02/2016  . Nausea without vomiting 09/01/2016  . Chronic diarrhea 12/07/2014  . Segmental [place] with [place] 09/12/2014  . Routine general medical examination at a health care  facility 06/05/2014  . Overweight 06/13/2011  . ABNORMAL CHEST XRAY  09/30/2007  . Hyperlipidemia 02/17/2007  . Essential hypertension 01/08/2007  . Osteoarthritis 01/08/2007    Jomarie Longs PT 09/14/2017, 10:31 AM  [facility] [address] [place], [address] Phone: [phone]   Fax:  [phone]  Name: [name] MRN: [id number] Date of Birth: Sep 09, 1950

## 2017-09-18 ENCOUNTER — Encounter: Payer: Self-pay | Admitting: Physical Therapy

## 2017-09-18 ENCOUNTER — Ambulatory Visit: Payer: Medicare Other | Admitting: Physical Therapy

## 2017-09-18 DIAGNOSIS — M25561 Pain in right knee: Secondary | ICD-10-CM

## 2017-09-18 DIAGNOSIS — M25661 Stiffness of right knee, not elsewhere classified: Secondary | ICD-10-CM | POA: Diagnosis not present

## 2017-09-18 DIAGNOSIS — R262 Difficulty in walking, not elsewhere classified: Secondary | ICD-10-CM | POA: Diagnosis not present

## 2017-09-18 DIAGNOSIS — M6281 Muscle weakness (generalized): Secondary | ICD-10-CM | POA: Diagnosis not present

## 2017-09-18 NOTE — Therapy (Signed)
[facility] [address] [place], [address] Phone: [phone]   Fax:  [phone]  Physical Therapy Treatment  Patient Details  Name: [name] MRN: [id number] Date of Birth: [birth year] Referring Provider: Joni Fears, MD   Encounter Date: 09/18/2017  PT End of Session - 09/18/17 1640    Visit Number  43    Number of Visits  48    Date for PT Re-Evaluation  10/03/17    Authorization Type  3/10 progress note    PT Start Time  1550    PT Stop Time  1630    PT Time Calculation (min)  40 min    Equipment Utilized During Treatment  Other (comment) pt hurry cane    Activity Tolerance  Patient tolerated treatment well    Behavior During Therapy  [facility] for tasks assessed/performed       Past Medical History:  Diagnosis Date  . Arthritis    back, fingers with joint pain and swelling.  chronic back pain  . Chronic back pain    arthritis   . Diverticulosis   . Elevated cholesterol    takes Niacin daily and Simvastatin  . GERD (gastroesophageal reflux disease) 06/2004   non-specific gastritis on EGD 06/2004  . Headache(784.0)    occasionally  . History of colon polyps 2004, 2009   2004:adenomatous. 2009 hyperplastic.   Marland Kitchen History of hiatal hernia   . Hypertension    takes Tenoretic and Lisinopril daily  . Mucoid cyst of joint 08/2013   right index finger  . Osteopenia 01/2014   T score -1.1 FRAX 14%/0.5%. Stable from prior DEXA  . PONV (postoperative nausea and vomiting)   . Seasonal allergies   . Stroke ([place]) 1998   x 2 - mild left-sided weakness  . Urge incontinence   . Uterine prolapse     Past Surgical History:  Procedure Laterality Date  . BACK SURGERY     x 2  . COLONOSCOPY  2004, 2009, 2014  . FINGER SURGERY    . GUM SURGERY    . GYNECOLOGIC CRYOSURGERY    . HYSTEROSCOPY W/D&C  12/07/2010   with resection of endometrial polyp  . KNEE ARTHROSCOPY Left 2008  . lip biopsy      done at MD office Fri 11/22/13  . MASS EXCISION Right 08/15/2013   Procedure: RIGHT INDEX EXCISION MASS ;  Surgeon: Tennis Must, MD;  Location: [place];  Service: Orthopedics;  Laterality: Right;  . NASAL SEPTUM SURGERY    . OOPHORECTOMY Right 2000  . SHOULDER ARTHROSCOPY WITH OPEN ROTATOR CUFF REPAIR AND DISTAL CLAVICLE ACROMINECTOMY Right 11/26/2013   Procedure: RIGHT SHOULDER ARTHROSCOPY WITH MINI OPEN ROTATOR CUFF REPAIR AND DISTAL CLAVICLE RESECTION, SUBACROMIAL DECOMPRESSION, POSSIBLE [facility] PATCH.;  Surgeon: [name], MD;  Location: [place];  Service: Orthopedics;  Laterality: Right;  . TOTAL KNEE ARTHROPLASTY Right 03/21/2017  . TOTAL KNEE ARTHROPLASTY Right 03/21/2017   Procedure: RIGHT TOTAL KNEE ARTHROPLASTY;  Surgeon: [name], MD;  Location: [place];  Service: Orthopedics;  Laterality: Right;  . TUBAL LIGATION      There were no vitals filed for this visit.  Subjective Assessment - 09/18/17 1639    Subjective  Patient reports continued chief concern of stiffness right knee. Overall she reports she is seeing slow, steady improvement with strength and ROM     Pertinent History  S/P R TKA on 03/21/2017. Pt had  osteoarthritis and it was "bone on bone." Had 5 home health PT visits, and a little bit of PT at the hospital. R knee hurts currently and feels tight.  PLOF: pt was using a SPC at times when her knee was acting up real bad but did not have to use her SPC all the time.      Limitations  Walking;Standing;House hold activities;Sitting    Patient Stated Goals  I just want to be able to get back to normal. (Has 5 dogs she has to take care of, do chores around the house, be active).     Currently in Pain?  Yes    Pain Score  3     Pain Location  Knee    Pain Orientation  Right    Pain Descriptors / Indicators  Tightness    Pain Type  Surgical pain 03/21/2017    Pain Onset  More than a month ago    Pain Frequency  Intermittent           Objective:  AAROM right knee flexion 0-108 and up to 115 with stretch end range post treatment  Palpation: right LE: decreased soft tissue elasticity along incision, lateral quadriceps, inferior to knee   Treatment:  Manual therapy:  25  min.: goal improve soft tissue elasticity, improve ROM right knee  patella mobilization with patella mobilizer with patient supine x 3 sets distraction and inferior glides  STM quadriceps right LE and along incision and inferior to patella and medial, superior aspect of patella and gentle stretch x 3 reps with MFR techniques to quadriceps with patient sitting with right LE supported    Therapeutic exercise: patient performed with demonstration, instruction, verbal and tactile cues of therapist: goal: independent with home program, improve LEFS, ambulation without AD    Sitting:  AAROM knee flexion right knee with stretch end range Isometric right knee flexion with manual resistance end range to facilitate knee flexion, 10 reps, 5 second holds   side lying left: performed with right LE Hip clamshells with graded manual resistance 2 x 15 reps; 1 x 25 without resistance with mild fatigue reported right hip abduction with assist of therapist for correct alignment  2 x 10 reps  Therapist assisted hip flexor stretch and abductor stretch x 3 reps each    Patient response to treatment: improved soft tissue elasticity, ROM right knee following STM and exercises. Patient required assistance and moderate tactile and VC to perform exercises with good alignment/technique       PT Education - 09/18/17 1700    Education provided  Yes    Education Details  re assessed HEP    Person(s) Educated  Patient    Methods  Explanation    Comprehension  Verbalized understanding          PT Long Term Goals - 08/22/17 1000      PT LONG TERM GOAL #1   Title  Patient will be able to ambulate at least 500 ft without use of AD and no LOB to promote mobility.     Baseline   Pt currently ambulates with rw (04/12/2017); patient ambulating with SPC with pain right and left LE knees and is using rolling walker as needed (05/29/17); 07/05/17 using c;ane for support; 07/24/2017 continues with cane for safety due to left knee pain    Status  Partially Met      PT LONG TERM GOAL #2   Title  Pt will improve seated R knee extension  AROM to -8 degrees or less to promote ability to ambulate, perform standing tasks.     Baseline  seated R knee extension AROM -25 degrees (04/12/2017)    Status  Achieved      PT LONG TERM GOAL #3   Title  Pt will improve seated R knee flexion AROM to at least 115 degrees to promote ability to ambulate, negiotiate steps, perform transfers with less difficulty.     Baseline  seated R knee flexion AROM 77 degrees (04/12/2017); AAROM right knee flexion 0-  up to 100 degrees with pain limiting further motion (05/29/17); AROM up to 105 degrees/ AAROM 110     Status  On-going    Target Date  09/19/17      PT LONG TERM GOAL #4   Title  Pt will improve R knee flexion strength to at least 4+/5 and R knee extension strength to at least 5/5 to promote ability to ambulate, perform chores.     Baseline  right knee flexion 4-/5, knee extension improving 4-/5: (05/29/17): knee extension 4/5 with pain, knee flexion 4/5 with pain: 08/22/17: right knee flexion 4/5, extension 4/5     Status  On-going    Target Date  10/03/17      PT LONG TERM GOAL #5   Title  Patient will improve her LEFS score to 50 or more points as a demonstration of improved function with daily tasks.    Baseline  15/80 (04/12/2017); LEFS 05/29/17 30/80; LEFS 07/05/17 27/80; 07/24/17 36/80; 08/22/17 36/80    Status  On-going    Target Date  10/03/17            Plan - 09/18/17 1700    Clinical Impression Statement  Patient continues to progress steadily with ROM and strength right LE s/p TKA. She continues with primary limitaiton of decreased ROM, strength and should continue to progress with  additional physical therapy intervention.     Rehab Potential  Fair    Clinical Impairments Affecting Rehab Potential  (-) healing time, pain; (+) husband support, acute condition, motivated    PT Frequency  2x / week    PT Duration  6 weeks    PT Treatment/Interventions  Neuromuscular re-education;Therapeutic exercise;Therapeutic activities;Manual techniques;Aquatic Therapy;Electrical Stimulation;Iontophoresis 68m/ml Dexamethasone;Gait training;Stair training;Balance training;Functional mobility training;Patient/family education;Scar mobilization;Passive range of motion;Dry needling    PT Next Visit Plan  ROM, gait, function, manual techniques, modalities PRN    PT Home Exercise Plan  ROM, standing hip abduction/extension; quad sets seated, knee flexion with resistive band; elevation/ use of ice for swelling/pain       Patient will benefit from skilled therapeutic intervention in order to improve the following deficits and impairments:  Pain, Difficulty walking, Decreased strength, Decreased scar mobility, Decreased range of motion  Visit Diagnosis: Acute pain of right knee  Muscle weakness (generalized)  Stiffness of right knee, not elsewhere classified  Difficulty in walking, not elsewhere classified     Problem List Patient Active Problem List   Diagnosis Date Noted  . S/P TKR (total knee replacement) using cement, right 03/21/2017  . Primary osteoarthritis of right knee 11/02/2016  . Nausea without vomiting 09/01/2016  . Chronic diarrhea 12/07/2014  . Segmental colitis with rectal bleeding ([place] 09/12/2014  . Routine general medical examination at a health care facility 06/05/2014  . Overweight 06/13/2011  . ABNORMAL CHEST XRAY 09/30/2007  . Hyperlipidemia 02/17/2007  . Essential hypertension 01/08/2007  . Osteoarthritis 01/08/2007    BJomarie LongsPT 09/18/2017, 10:23  PM  [facility] [address], [place], [address] Phone: [phone]   Fax:  [phone]  Name: FLARA STORTI MRN: [id number] Date of Birth: [birth year]

## 2017-09-20 ENCOUNTER — Encounter: Payer: Self-pay | Admitting: Physical Therapy

## 2017-09-20 ENCOUNTER — Ambulatory Visit: Payer: Medicare Other | Admitting: Physical Therapy

## 2017-09-20 DIAGNOSIS — R262 Difficulty in walking, not elsewhere classified: Secondary | ICD-10-CM

## 2017-09-20 DIAGNOSIS — M25661 Stiffness of right knee, not elsewhere classified: Secondary | ICD-10-CM | POA: Diagnosis not present

## 2017-09-20 DIAGNOSIS — M6281 Muscle weakness (generalized): Secondary | ICD-10-CM

## 2017-09-20 DIAGNOSIS — M25561 Pain in right knee: Secondary | ICD-10-CM | POA: Diagnosis not present

## 2017-09-20 NOTE — Therapy (Signed)
[place] [facility] [address], [place], [address] Phone: [phone]   Fax:  [phone]  Physical Therapy Treatment  Patient Details  Name: [name] MRN: [id number] Date of Birth: Feb 23, 1951 Referring Provider: Joni Fears, MD   Encounter Date: 09/20/2017  PT End of Session - 09/20/17 1345    Visit Number  44    Number of Visits  48    Date for PT Re-Evaluation  10/03/17    Authorization Type  3/10 progress note    PT Start Time  1342    PT Stop Time  1425    PT Time Calculation (min)  43 min    Equipment Utilized During Treatment  Other (comment) pt hurry cane    Activity Tolerance  Patient tolerated treatment well    Behavior During Therapy  [facility] for tasks assessed/performed       Past Medical History:  Diagnosis Date  . Arthritis    back, fingers with joint pain and swelling.  chronic back pain  . Chronic back pain    arthritis   . Diverticulosis   . Elevated cholesterol    takes Niacin daily and Simvastatin  . GERD (gastroesophageal reflux disease) 06/2004   non-specific gastritis on EGD 06/2004  . Headache(784.0)    occasionally  . History of colon polyps 2004, 2009   2004:adenomatous. 2009 hyperplastic.   Marland Kitchen History of hiatal hernia   . Hypertension    takes Tenoretic and Lisinopril daily  . Mucoid cyst of joint 08/2013   right index finger  . Osteopenia 01/2014   T score -1.1 FRAX 14%/0.5%. Stable from prior DEXA  . PONV (postoperative nausea and vomiting)   . Seasonal allergies   . Stroke ([place]) 1998   x 2 - mild left-sided weakness  . Urge incontinence   . Uterine prolapse     Past Surgical History:  Procedure Laterality Date  . BACK SURGERY     x 2  . COLONOSCOPY  2004, 2009, 2014  . FINGER SURGERY    . GUM SURGERY    . GYNECOLOGIC CRYOSURGERY    . HYSTEROSCOPY W/D&C  12/07/2010   with resection of endometrial polyp  . KNEE ARTHROSCOPY Left 2008  . lip biopsy      done at MD office Fri 11/22/13  . MASS EXCISION Right 08/15/2013   Procedure: RIGHT INDEX EXCISION MASS ;  Surgeon: Tennis Must, MD;  Location: [place];  Service: Orthopedics;  Laterality: Right;  . NASAL SEPTUM SURGERY    . OOPHORECTOMY Right 2000  . SHOULDER ARTHROSCOPY WITH OPEN ROTATOR CUFF REPAIR AND DISTAL CLAVICLE ACROMINECTOMY Right 11/26/2013   Procedure: RIGHT SHOULDER ARTHROSCOPY WITH MINI OPEN ROTATOR CUFF REPAIR AND DISTAL CLAVICLE RESECTION, SUBACROMIAL DECOMPRESSION, POSSIBLE [facility] PATCH.;  Surgeon: [name], MD;  Location: [place];  Service: Orthopedics;  Laterality: Right;  . TOTAL KNEE ARTHROPLASTY Right 03/21/2017  . TOTAL KNEE ARTHROPLASTY Right 03/21/2017   Procedure: RIGHT TOTAL KNEE ARTHROPLASTY;  Surgeon: [name], MD;  Location: [place];  Service: Orthopedics;  Laterality: Right;  . TUBAL LIGATION      There were no vitals filed for this visit.  Subjective Assessment - 09/20/17 1343    Subjective  Patient reports her knee is improving slowly, still has stiffness as primary concern    Pertinent History  S/P R TKA on 03/21/2017. Pt had osteoarthritis and it was "bone on bone." Had 5 home  health PT visits, and a little bit of PT at the hospital. R knee hurts currently and feels tight.  PLOF: pt was using a SPC at times when her knee was acting up real bad but did not have to use her SPC all the time.      Limitations  Walking;Standing;House hold activities;Sitting    Patient Stated Goals  I just want to be able to get back to normal. (Has 5 dogs she has to take care of, do chores around the house, be active).     Currently in Pain?  Yes    Pain Score  3     Pain Location  Knee    Pain Orientation  Right    Pain Descriptors / Indicators  Tightness    Pain Type  Surgical pain 03/21/2017    Pain Onset  More than a month ago    Pain Frequency  Intermittent       Objective:  AAROM right knee flexion 0-108 and up to 115 with stretch  end range post treatment  Palpation: right LE: decreased soft tissue elasticity along incision, lateral quadriceps, inferior aspect of knee   Treatment:  Manual therapy:  28  min.: goal improve soft tissue elasticity, improve ROM right knee  patella mobilization with patella mobilizer with patient supine x 5 reps for distraction and inferior glides  STM quadriceps right LE and along incision and inferior to patella and medial, superior aspect of patella and gentle stretch x 3 reps with MFR techniques to quadriceps with patient sitting with right LE supported    Therapeutic exercise: patient performed with demonstration, instruction, verbal and tactile cues of therapist: goal: independent with home program, improve LEFS, ambulation without AD    Sitting:  AAROM knee flexion right knee with stretch end range Isometric right knee flexion with manual resistance end range to facilitate knee flexion, 10 reps, 5 second holds   side lying left: performed with right LE Hip clamshells with graded manual resistance 2 x 15 reps right hip abduction with assist of therapist for correct alignment  2 x 10 reps  Therapist assisted hip flexor stretch and abductor stretch x 3 reps each    Patient response to treatment: improved soft tissue elasticity with improved ROM by 5 degrees following treatment. improved strength noted with resistance for ER and knee flexion       PT Education - 09/20/17 1345    Education provided  Yes    Education Details  re assessed HEP    Person(s) Educated  Patient    Methods  Explanation    Comprehension  Verbalized understanding          PT Long Term Goals - 08/22/17 1000      PT LONG TERM GOAL #1   Title  Patient will be able to ambulate at least 500 ft without use of AD and no LOB to promote mobility.     Baseline  Pt currently ambulates with rw (04/12/2017); patient ambulating with SPC with pain right and left LE knees and is using rolling walker as needed  (05/29/17); 07/05/17 using c;ane for support; 07/24/2017 continues with cane for safety due to left knee pain    Status  Partially Met      PT LONG TERM GOAL #2   Title  Pt will improve seated R knee extension AROM to -8 degrees or less to promote ability to ambulate, perform standing tasks.     Baseline  seated R knee  extension AROM -25 degrees (04/12/2017)    Status  Achieved      PT LONG TERM GOAL #3   Title  Pt will improve seated R knee flexion AROM to at least 115 degrees to promote ability to ambulate, negiotiate steps, perform transfers with less difficulty.     Baseline  seated R knee flexion AROM 77 degrees (04/12/2017); AAROM right knee flexion 0-  up to 100 degrees with pain limiting further motion (05/29/17); AROM up to 105 degrees/ AAROM 110     Status  On-going    Target Date  09/19/17      PT LONG TERM GOAL #4   Title  Pt will improve R knee flexion strength to at least 4+/5 and R knee extension strength to at least 5/5 to promote ability to ambulate, perform chores.     Baseline  right knee flexion 4-/5, knee extension improving 4-/5: (05/29/17): knee extension 4/5 with pain, knee flexion 4/5 with pain: 08/22/17: right knee flexion 4/5, extension 4/5     Status  On-going    Target Date  10/03/17      PT LONG TERM GOAL #5   Title  Patient will improve her LEFS score to 50 or more points as a demonstration of improved function with daily tasks.    Baseline  15/80 (04/12/2017); LEFS 05/29/17 30/80; LEFS 07/05/17 27/80; 07/24/17 36/80; 08/22/17 36/80    Status  On-going    Target Date  10/03/17            Plan - 09/20/17 1508    Clinical Impression Statement  Patient continues steady progress towards goals with ROM and strength in right LE following TKA. She continues to benefit from soft tissue mobilization and exercises to improve function with daily activities.     Rehab Potential  Fair    Clinical Impairments Affecting Rehab Potential  (-) healing time, pain; (+) husband support,  acute condition, motivated    PT Frequency  2x / week    PT Duration  6 weeks    PT Treatment/Interventions  Neuromuscular re-education;Therapeutic exercise;Therapeutic activities;Manual techniques;Aquatic Therapy;Electrical Stimulation;Iontophoresis 73m/ml Dexamethasone;Gait training;Stair training;Balance training;Functional mobility training;Patient/family education;Scar mobilization;Passive range of motion;Dry needling    PT Next Visit Plan  ROM, gait, function, manual techniques, modalities PRN    PT Home Exercise Plan  ROM, standing hip abduction/extension; quad sets seated, knee flexion with resistive band; elevation/ use of ice for swelling/pain       Patient will benefit from skilled therapeutic intervention in order to improve the following deficits and impairments:  Pain, Difficulty walking, Decreased strength, Decreased scar mobility, Decreased range of motion  Visit Diagnosis: Acute pain of right knee  Muscle weakness (generalized)  Stiffness of right knee, not elsewhere classified  Difficulty in walking, not elsewhere classified     Problem List Patient Active Problem List   Diagnosis Date Noted  . S/P TKR (total knee replacement) using cement, right 03/21/2017  . Primary osteoarthritis of right knee 11/02/2016  . Nausea without vomiting 09/01/2016  . Chronic diarrhea 12/07/2014  . Segmental colitis with rectal bleeding ([place] 09/12/2014  . Routine general medical examination at a health care facility 06/05/2014  . Overweight 06/13/2011  . ABNORMAL CHEST XRAY 09/30/2007  . Hyperlipidemia 02/17/2007  . Essential hypertension 01/08/2007  . Osteoarthritis 01/08/2007    BJomarie LongsPT 09/20/2017, 3:10 PM  [facility] [address] [place] [address]Phone: [phone]  Fax:  [phone]  Name: [name] MRN: [id number] Date of Birth: October 24, 1950

## 2017-09-21 DIAGNOSIS — H2511 Age-related nuclear cataract, right eye: Secondary | ICD-10-CM | POA: Diagnosis not present

## 2017-09-21 DIAGNOSIS — H25811 Combined forms of age-related cataract, right eye: Secondary | ICD-10-CM | POA: Diagnosis not present

## 2017-09-25 ENCOUNTER — Encounter: Payer: Self-pay | Admitting: Physical Therapy

## 2017-09-25 ENCOUNTER — Ambulatory Visit: Payer: Medicare Other | Admitting: Physical Therapy

## 2017-09-25 DIAGNOSIS — M6281 Muscle weakness (generalized): Secondary | ICD-10-CM | POA: Diagnosis not present

## 2017-09-25 DIAGNOSIS — M25661 Stiffness of right knee, not elsewhere classified: Secondary | ICD-10-CM

## 2017-09-25 DIAGNOSIS — R262 Difficulty in walking, not elsewhere classified: Secondary | ICD-10-CM

## 2017-09-25 DIAGNOSIS — M25561 Pain in right knee: Secondary | ICD-10-CM | POA: Diagnosis not present

## 2017-09-25 NOTE — Therapy (Signed)
[place] [address] [place], [address] Phone: [phone]   Fax:  [phone]  Physical Therapy Treatment  Patient Details  Name: [name] MRN: [id number] Date of Birth: 10-13-1950 Referring Provider: Joni Fears, MD   Encounter Date: 09/25/2017  PT End of Session - 09/25/17 1336    Visit Number  45    Number of Visits  48    Date for PT Re-Evaluation  10/03/17    Authorization Type  4/10 progress note    PT Start Time  1332    PT Stop Time  1414    PT Time Calculation (min)  42 min    Equipment Utilized During Treatment  Other (comment) pt hurry cane    Activity Tolerance  Patient tolerated treatment well    Behavior During Therapy  [facility] for tasks assessed/performed       Past Medical History:  Diagnosis Date  . Arthritis    back, fingers with joint pain and swelling.  chronic back pain  . Chronic back pain    arthritis   . Diverticulosis   . Elevated cholesterol    takes Niacin daily and Simvastatin  . GERD (gastroesophageal reflux disease) 06/2004   non-specific gastritis on EGD 06/2004  . Headache(784.0)    occasionally  . History of colon polyps 2004, 2009   2004:adenomatous. 2009 hyperplastic.   Marland Kitchen History of hiatal hernia   . Hypertension    takes Tenoretic and Lisinopril daily  . Mucoid cyst of joint 08/2013   right index finger  . Osteopenia 01/2014   T score -1.1 FRAX 14%/0.5%. Stable from prior DEXA  . PONV (postoperative nausea and vomiting)   . Seasonal allergies   . Stroke ([place]) 1998   x 2 - mild left-sided weakness  . Urge incontinence   . Uterine prolapse     Past Surgical History:  Procedure Laterality Date  . BACK SURGERY     x 2  . COLONOSCOPY  2004, 2009, 2014  . FINGER SURGERY    . GUM SURGERY    . GYNECOLOGIC CRYOSURGERY    . HYSTEROSCOPY W/D&C  12/07/2010   with resection of endometrial polyp  . KNEE ARTHROSCOPY Left 2008  . lip biopsy      done at MD office Fri 11/22/13  . MASS EXCISION Right 08/15/2013   Procedure: RIGHT INDEX EXCISION MASS ;  Surgeon: Tennis Must, MD;  Location: [place];  Service: Orthopedics;  Laterality: Right;  . NASAL SEPTUM SURGERY    . OOPHORECTOMY Right 2000  . SHOULDER ARTHROSCOPY WITH OPEN ROTATOR CUFF REPAIR AND DISTAL CLAVICLE ACROMINECTOMY Right 11/26/2013   Procedure: RIGHT SHOULDER ARTHROSCOPY WITH MINI OPEN ROTATOR CUFF REPAIR AND DISTAL CLAVICLE RESECTION, SUBACROMIAL DECOMPRESSION, POSSIBLE [facility] PATCH.;  Surgeon: [name], MD;  Location: [place];  Service: Orthopedics;  Laterality: Right;  . TOTAL KNEE ARTHROPLASTY Right 03/21/2017  . TOTAL KNEE ARTHROPLASTY Right 03/21/2017   Procedure: RIGHT TOTAL KNEE ARTHROPLASTY;  Surgeon: [name], MD;  Location: [place];  Service: Orthopedics;  Laterality: Right;  . TUBAL LIGATION      There were no vitals filed for this visit.  Subjective Assessment - 09/25/17 1333    Subjective  Patient reports she is doing about the same with stiffness; left knee is still bothering her.     Pertinent History  S/P R TKA on 03/21/2017. Pt had osteoarthritis and it was "bone on  bone." Had 5 home health PT visits, and a little bit of PT at the hospital. R knee hurts currently and feels tight.  PLOF: pt was using a SPC at times when her knee was acting up real bad but did not have to use her SPC all the time.      Limitations  Walking;Standing;House hold activities;Sitting    Patient Stated Goals  I just want to be able to get back to normal. (Has 5 dogs she has to take care of, do chores around the house, be active).     Currently in Pain?  Yes    Pain Score  3     Pain Orientation  Right    Pain Descriptors / Indicators  Tightness    Pain Type  Surgical pain 03/21/2017    Pain Onset  More than a month ago    Pain Frequency  Intermittent         Objective:  AAROM right knee flexion 0-108 and up to 115 with stretch end range  post treatment  Palpation: right LE: decreased soft tissue elasticity along incision, lateral quadriceps, inferior aspect of knee   Treatment:  Manual therapy:  28  min.: goal improve soft tissue elasticity, improve ROM right knee  patella mobilization with patella mobilizer with patient supine x 10 reps for distraction and inferior glides  STM quadriceps right LE and along incision and inferior to patella and medial, superior aspect of patella and gentle stretch x 5 reps with MFR techniques to quadriceps with patient sitting and supine with right LE supported    Therapeutic exercise: patient performed with demonstration, instruction, verbal and tactile cues of therapist: goal: independent with home program, improve LEFS, ambulation without AD    Sitting:  AAROM knee flexion right knee with stretch end range Isometric right knee flexion with manual resistance end range to facilitate knee flexion, 10 reps, 5 second holds   supine lying: performed with right LE Hip clamshells with graded manual resistance  x 25 reps right hip abduction with assist of therapist for correct alignment  2 x 10 reps  hip/knee flexion 2 sets with hamstring sets end range knee flexion 2 x 5 reps   Patient response to treatment: improved soft tissue mobility with improved ROM right knee flexion up to 115 with assistance and pain and stiffness limiting further motion. improved right hip motor control and strength with repetition.              PT Education - 09/25/17 1335    Education provided  Yes    Education Details  exercise instruction, reviewed HEP     Person(s) Educated  Patient    Methods  Explanation;Demonstration;Verbal cues    Comprehension  Verbalized understanding;Returned demonstration;Verbal cues required          PT Long Term Goals - 08/22/17 1000      PT LONG TERM GOAL #1   Title  Patient will be able to ambulate at least 500 ft without use of AD and no LOB to promote mobility.      Baseline  Pt currently ambulates with rw (04/12/2017); patient ambulating with SPC with pain right and left LE knees and is using rolling walker as needed (05/29/17); 07/05/17 using c;ane for support; 07/24/2017 continues with cane for safety due to left knee pain    Status  Partially Met      PT LONG TERM GOAL #2   Title  Pt will improve seated R knee  extension AROM to -8 degrees or less to promote ability to ambulate, perform standing tasks.     Baseline  seated R knee extension AROM -25 degrees (04/12/2017)    Status  Achieved      PT LONG TERM GOAL #3   Title  Pt will improve seated R knee flexion AROM to at least 115 degrees to promote ability to ambulate, negiotiate steps, perform transfers with less difficulty.     Baseline  seated R knee flexion AROM 77 degrees (04/12/2017); AAROM right knee flexion 0-  up to 100 degrees with pain limiting further motion (05/29/17); AROM up to 105 degrees/ AAROM 110     Status  On-going    Target Date  09/19/17      PT LONG TERM GOAL #4   Title  Pt will improve R knee flexion strength to at least 4+/5 and R knee extension strength to at least 5/5 to promote ability to ambulate, perform chores.     Baseline  right knee flexion 4-/5, knee extension improving 4-/5: (05/29/17): knee extension 4/5 with pain, knee flexion 4/5 with pain: 08/22/17: right knee flexion 4/5, extension 4/5     Status  On-going    Target Date  10/03/17      PT LONG TERM GOAL #5   Title  Patient will improve her LEFS score to 50 or more points as a demonstration of improved function with daily tasks.    Baseline  15/80 (04/12/2017); LEFS 05/29/17 30/80; LEFS 07/05/17 27/80; 07/24/17 36/80; 08/22/17 36/80    Status  On-going    Target Date  10/03/17            Plan - 09/25/17 1336    Clinical Impression Statement  Patient continues with steady progress with limitations of stiffness and strength right LE. She is responding well to current treatment with emphasis on manual techniques to  improve flexibility of right knee with decreased soreness/tightness. Left knee pain also contributes to difficulty with walking and performing daily activities as she is waiting for TKA for this knee. .    Rehab Potential  Fair    Clinical Impairments Affecting Rehab Potential  (-) healing time, pain; (+) husband support, acute condition, motivated    PT Frequency  2x / week    PT Duration  6 weeks    PT Treatment/Interventions  Neuromuscular re-education;Therapeutic exercise;Therapeutic activities;Manual techniques;Aquatic Therapy;Electrical Stimulation;Iontophoresis 70m/ml Dexamethasone;Gait training;Stair training;Balance training;Functional mobility training;Patient/family education;Scar mobilization;Passive range of motion;Dry needling    PT Next Visit Plan  ROM, gait, function, manual techniques, modalities PRN    PT Home Exercise Plan  ROM, standing hip abduction/extension; quad sets seated, knee flexion with resistive band; elevation/ use of ice for swelling/pain       Patient will benefit from skilled therapeutic intervention in order to improve the following deficits and impairments:  Pain, Difficulty walking, Decreased strength, Decreased scar mobility, Decreased range of motion  Visit Diagnosis: Muscle weakness (generalized)  Stiffness of right knee, not elsewhere classified  Difficulty in walking, not elsewhere classified     Problem List Patient Active Problem List   Diagnosis Date Noted  . S/P TKR (total knee replacement) using cement, right 03/21/2017  . Primary osteoarthritis of right knee 11/02/2016  . Nausea without vomiting 09/01/2016  . Chronic diarrhea 12/07/2014  . Segmental colitis with rectal bleeding ([place] 09/12/2014  . Routine general medical examination at a health care facility 06/05/2014  . Overweight 06/13/2011  . ABNORMAL CHEST XRAY 09/30/2007  .  Hyperlipidemia 02/17/2007  . Essential hypertension 01/08/2007  . Osteoarthritis 01/08/2007     Jomarie Longs PT 09/25/2017, 2:23 PM  [facility] [address], [place], [address] Phone: [phone]   Fax:  [phone]  Name: [name] MRN: [id number] Date of Birth: 1950/05/17

## 2017-09-27 ENCOUNTER — Encounter: Payer: Self-pay | Admitting: Physical Therapy

## 2017-09-27 ENCOUNTER — Ambulatory Visit: Payer: Medicare Other | Admitting: Physical Therapy

## 2017-09-27 DIAGNOSIS — R262 Difficulty in walking, not elsewhere classified: Secondary | ICD-10-CM | POA: Diagnosis not present

## 2017-09-27 DIAGNOSIS — M6281 Muscle weakness (generalized): Secondary | ICD-10-CM

## 2017-09-27 DIAGNOSIS — M25661 Stiffness of right knee, not elsewhere classified: Secondary | ICD-10-CM | POA: Diagnosis not present

## 2017-09-27 DIAGNOSIS — M25561 Pain in right knee: Secondary | ICD-10-CM | POA: Diagnosis not present

## 2017-09-27 NOTE — Therapy (Signed)
[facility] [address], [place], [address] Phone: [phone]   Fax:  [phone]  Physical Therapy Treatment  Patient Details  Name: [name] MRN: [id number] Date of Birth: 1950-10-25 Referring Provider: Joni Fears, MD   Encounter Date: 09/27/2017  PT End of Session - 09/27/17 1425    Visit Number  46    Number of Visits  48    Date for PT Re-Evaluation  10/03/17    Authorization Type  5/10 progress note    PT Start Time  1342    PT Stop Time  1425    PT Time Calculation (min)  43 min    Activity Tolerance  Patient tolerated treatment well    Behavior During Therapy  [facility] for tasks assessed/performed       Past Medical History:  Diagnosis Date  . Arthritis    back, fingers with joint pain and swelling.  chronic back pain  . Chronic back pain    arthritis   . Diverticulosis   . Elevated cholesterol    takes Niacin daily and Simvastatin  . GERD (gastroesophageal reflux disease) 06/2004   non-specific gastritis on EGD 06/2004  . Headache(784.0)    occasionally  . History of colon polyps 2004, 2009   2004:adenomatous. 2009 hyperplastic.   Marland Kitchen History of hiatal hernia   . Hypertension    takes Tenoretic and Lisinopril daily  . Mucoid cyst of joint 08/2013   right index finger  . Osteopenia 01/2014   T score -1.1 FRAX 14%/0.5%. Stable from prior DEXA  . PONV (postoperative nausea and vomiting)   . Seasonal allergies   . Stroke ([place]) 1998   x 2 - mild left-sided weakness  . Urge incontinence   . Uterine prolapse     Past Surgical History:  Procedure Laterality Date  . BACK SURGERY     x 2  . COLONOSCOPY  2004, 2009, 2014  . FINGER SURGERY    . GUM SURGERY    . GYNECOLOGIC CRYOSURGERY    . HYSTEROSCOPY W/D&C  12/07/2010   with resection of endometrial polyp  . KNEE ARTHROSCOPY Left 2008  . lip biopsy     done at MD office Fri 11/22/13  . MASS EXCISION Right 08/15/2013    Procedure: RIGHT INDEX EXCISION MASS ;  Surgeon: Tennis Must, MD;  Location: [place];  Service: Orthopedics;  Laterality: Right;  . NASAL SEPTUM SURGERY    . OOPHORECTOMY Right 2000  . SHOULDER ARTHROSCOPY WITH OPEN ROTATOR CUFF REPAIR AND DISTAL CLAVICLE ACROMINECTOMY Right 11/26/2013   Procedure: RIGHT SHOULDER ARTHROSCOPY WITH MINI OPEN ROTATOR CUFF REPAIR AND DISTAL CLAVICLE RESECTION, SUBACROMIAL DECOMPRESSION, POSSIBLE [facility] PATCH.;  Surgeon: [name], MD;  Location: [place];  Service: Orthopedics;  Laterality: Right;  . TOTAL KNEE ARTHROPLASTY Right 03/21/2017  . TOTAL KNEE ARTHROPLASTY Right 03/21/2017   Procedure: RIGHT TOTAL KNEE ARTHROPLASTY;  Surgeon: [name], MD;  Location: [facility];  Service: Orthopedics;  Laterality: Right;  . TUBAL LIGATION      There were no vitals filed for this visit.  Subjective Assessment - 09/27/17 1351    Subjective  Patient reports she is feeling improvement in her knee with using a new cream that may be helping.     Pertinent History  S/P R TKA on 03/21/2017. Pt had osteoarthritis and it was "bone on bone." Had 5 home health PT visits, and a little bit  of PT at the hospital. R knee hurts currently and feels tight.  PLOF: pt was using a SPC at times when her knee was acting up real bad but did not have to use her SPC all the time.      Limitations  Walking;Standing;House hold activities;Sitting    Patient Stated Goals  I just want to be able to get back to normal. (Has 5 dogs she has to take care of, do chores around the house, be active).     Currently in Pain?  Yes    Pain Score  3     Pain Location  Knee    Pain Orientation  Right    Pain Descriptors / Indicators  Tightness    Pain Type  Surgical pain 03/21/2017    Pain Onset  More than a month ago    Pain Frequency  Intermittent           Objective:  AAROM right knee flexion 0-108 and up to 115 with stretch end range post treatment  Palpation: right LE:  decreased soft tissue elasticity along incision, lateral quadriceps, medial aspect of knee and lateral hamstring    Treatment:  Manual therapy:  28  min.: goal improve soft tissue elasticity, improve ROM right knee  patella mobilization with patella mobilizer with patient supine x 10 reps for distraction and inferior glides  STM lateral and central quadriceps just superior to patella right LE and along incision and inferior to patella and medial, and gentle stretch x 5 reps with MFR techniques to quadriceps with patient sitting and supine with right LE supported    Therapeutic exercise: patient performed with demonstration, instruction, verbal and tactile cues of therapist: goal: independent with home program, improve LEFS, ambulation without AD    Sitting:  AAROM knee flexion right knee with stretch end range Isometric right knee flexion with manual resistance end range to facilitate knee flexion, 10 reps, 5 second holds   standing hamstring curls each LE x 5 reps   Patient response to treatment: improved flexibiltiy in right knee flexion following STM. Patient improved technqiue with exercises with minimal cuing and demonstration.        PT Education - 09/27/17 1426    Education provided  Yes    Education Details  home program for self STM lateral quadriceps and hamstring muscles    Person(s) Educated  Patient    Methods  Explanation;Demonstration;Verbal cues    Comprehension  Verbalized understanding;Returned demonstration;Verbal cues required          PT Long Term Goals - 08/22/17 1000      PT LONG TERM GOAL #1   Title  Patient will be able to ambulate at least 500 ft without use of AD and no LOB to promote mobility.     Baseline  Pt currently ambulates with rw (04/12/2017); patient ambulating with SPC with pain right and left LE knees and is using rolling walker as needed (05/29/17); 07/05/17 using c;ane for support; 07/24/2017 continues with cane for safety due to left knee  pain    Status  Partially Met      PT LONG TERM GOAL #2   Title  Pt will improve seated R knee extension AROM to -8 degrees or less to promote ability to ambulate, perform standing tasks.     Baseline  seated R knee extension AROM -25 degrees (04/12/2017)    Status  Achieved      PT LONG TERM GOAL #3   Title  Pt will improve seated R knee flexion AROM to at least 115 degrees to promote ability to ambulate, negiotiate steps, perform transfers with less difficulty.     Baseline  seated R knee flexion AROM 77 degrees (04/12/2017); AAROM right knee flexion 0-  up to 100 degrees with pain limiting further motion (05/29/17); AROM up to 105 degrees/ AAROM 110     Status  On-going    Target Date  09/19/17      PT LONG TERM GOAL #4   Title  Pt will improve R knee flexion strength to at least 4+/5 and R knee extension strength to at least 5/5 to promote ability to ambulate, perform chores.     Baseline  right knee flexion 4-/5, knee extension improving 4-/5: (05/29/17): knee extension 4/5 with pain, knee flexion 4/5 with pain: 08/22/17: right knee flexion 4/5, extension 4/5     Status  On-going    Target Date  10/03/17      PT LONG TERM GOAL #5   Title  Patient will improve her LEFS score to 50 or more points as a demonstration of improved function with daily tasks.    Baseline  15/80 (04/12/2017); LEFS 05/29/17 30/80; LEFS 07/05/17 27/80; 07/24/17 36/80; 08/22/17 36/80    Status  On-going    Target Date  10/03/17            Plan - 09/27/17 1433    Clinical Impression Statement  Patient demonstrated improved ROM with less stiffness and pain following treatment with STM. She continues with limitaiotns of ROM, soft tissue mobility and strength and is benefitting from physical therapy intervention.     Rehab Potential  Fair    Clinical Impairments Affecting Rehab Potential  (-) healing time, pain; (+) husband support, acute condition, motivated    PT Frequency  2x / week    PT Duration  6 weeks    PT  Treatment/Interventions  Neuromuscular re-education;Therapeutic exercise;Therapeutic activities;Manual techniques;Aquatic Therapy;Electrical Stimulation;Iontophoresis 21m/ml Dexamethasone;Gait training;Stair training;Balance training;Functional mobility training;Patient/family education;Scar mobilization;Passive range of motion;Dry needling    PT Next Visit Plan  ROM, gait, function, manual techniques, modalities PRN    PT Home Exercise Plan  ROM, standing hip abduction/extension; quad sets seated, knee flexion with resistive band; elevation/ use of ice for swelling/pain       Patient will benefit from skilled therapeutic intervention in order to improve the following deficits and impairments:  Pain, Difficulty walking, Decreased strength, Decreased scar mobility, Decreased range of motion  Visit Diagnosis: Stiffness of right knee, not elsewhere classified  Muscle weakness (generalized)  Difficulty in walking, not elsewhere classified     Problem List Patient Active Problem List   Diagnosis Date Noted  . S/P TKR (total knee replacement) using cement, right 03/21/2017  . Primary osteoarthritis of right knee 11/02/2016  . Nausea without vomiting 09/01/2016  . Chronic diarrhea 12/07/2014  . Segmental colitis with [place] bleeding [place] 09/12/2014  . Routine general medical examination at a health care facility 06/05/2014  . Overweight 06/13/2011  . ABNORMAL CHEST XRAY 09/30/2007  . Hyperlipidemia 02/17/2007  . Essential hypertension 01/08/2007  . Osteoarthritis 01/08/2007    BJomarie LongsPT 09/27/2017, 10:38 PM  [facility] [address] [place] [address]Phone: [phone]  Fax:  [phone] Name: NLYNNELL FIUMARAMRN: [id number] of Birth: 304/21/1952

## 2017-10-02 ENCOUNTER — Encounter: Payer: Self-pay | Admitting: Gastroenterology

## 2017-10-03 ENCOUNTER — Ambulatory Visit: Payer: Medicare Other | Admitting: Physical Therapy

## 2017-10-03 ENCOUNTER — Encounter: Payer: Self-pay | Admitting: Physical Therapy

## 2017-10-03 DIAGNOSIS — M25661 Stiffness of right knee, not elsewhere classified: Secondary | ICD-10-CM

## 2017-10-03 DIAGNOSIS — M25561 Pain in right knee: Secondary | ICD-10-CM | POA: Diagnosis not present

## 2017-10-03 DIAGNOSIS — M6281 Muscle weakness (generalized): Secondary | ICD-10-CM

## 2017-10-03 DIAGNOSIS — R262 Difficulty in walking, not elsewhere classified: Secondary | ICD-10-CM | POA: Diagnosis not present

## 2017-10-03 NOTE — Therapy (Signed)
[facility] [address], [place], [address] Phone: [phone]   Fax:  [phone]  Physical Therapy Treatment Discharge Summary  Patient Details  Name: [name] MRN: [id number] Date of Birth: December 10, 1950 Referring Provider: Joni Fears, MD   Encounter Date: 10/03/2017   Patient began physical therapy on 04/11/2017 and has attended 47 sessions through 10/03/2017. She has achieved/partially met all goals and is independent in home program for continued self management of pain/symptoms and exercises as instructed. Plan discharge from physical therapy at this time.    PT End of Session - 10/03/17 1309    Visit Number  47    Number of Visits  48    Date for PT Re-Evaluation  10/03/17    Authorization Type  6/10 progress note    PT Start Time  1302    PT Stop Time  1350    PT Time Calculation (min)  48 min    Activity Tolerance  Patient tolerated treatment well    Behavior During Therapy  WFL for tasks assessed/performed       Past Medical History:  Diagnosis Date  . Arthritis    back, fingers with joint pain and swelling.  chronic back pain  . Chronic back pain    arthritis   . Diverticulosis   . Elevated cholesterol    takes Niacin daily and Simvastatin  . GERD (gastroesophageal reflux disease) 06/2004   non-specific gastritis on EGD 06/2004  . Headache(784.0)    occasionally  . History of colon polyps 2004, 2009   2004:adenomatous. 2009 hyperplastic.   Marland Kitchen History of hiatal hernia   . Hypertension    takes Tenoretic and Lisinopril daily  . Mucoid cyst of joint 08/2013   right index finger  . Osteopenia 01/2014   T score -1.1 FRAX 14%/0.5%. Stable from prior DEXA  . PONV (postoperative nausea and vomiting)   . Seasonal allergies   . Stroke ([place]) 1998   x 2 - mild left-sided weakness  . Urge incontinence   . Uterine prolapse     Past Surgical History:  Procedure Laterality Date  .  BACK SURGERY     x 2  . COLONOSCOPY  2004, 2009, 2014  . FINGER SURGERY    . GUM SURGERY    . GYNECOLOGIC CRYOSURGERY    . HYSTEROSCOPY W/D&C  12/07/2010   with resection of endometrial polyp  . KNEE ARTHROSCOPY Left 2008  . lip biopsy     done at MD office Fri 11/22/13  . MASS EXCISION Right 08/15/2013   Procedure: RIGHT INDEX EXCISION MASS ;  Surgeon: Tennis Must, MD;  Location: [place];  Service: Orthopedics;  Laterality: Right;  . NASAL SEPTUM SURGERY    . OOPHORECTOMY Right 2000  . SHOULDER ARTHROSCOPY WITH OPEN ROTATOR CUFF REPAIR AND DISTAL CLAVICLE ACROMINECTOMY Right 11/26/2013   Procedure: RIGHT SHOULDER ARTHROSCOPY WITH MINI OPEN ROTATOR CUFF REPAIR AND DISTAL CLAVICLE RESECTION, SUBACROMIAL DECOMPRESSION, POSSIBLE [facility] PATCH.;  Surgeon: [name], MD;  Location: [place];  Service: Orthopedics;  Laterality: Right;  . TOTAL KNEE ARTHROPLASTY Right 03/21/2017  . TOTAL KNEE ARTHROPLASTY Right 03/21/2017   Procedure: RIGHT TOTAL KNEE ARTHROPLASTY;  Surgeon: [name], MD;  Location: [place];  Service: Orthopedics;  Laterality: Right;  . TUBAL LIGATION      There were no vitals filed for this visit.  Subjective Assessment - 10/03/17 1304    Subjective  Patient reports she is noticing improvement slowly in right knee and is exercising and working with it at home. She reports the creams she is using seem to be helping with pain and improving flexibility.     Pertinent History  S/P R TKA on 03/21/2017. Pt had osteoarthritis and it was "bone on bone." Had 5 home health PT visits, and a little bit of PT at the hospital. R knee hurts currently and feels tight.  PLOF: pt was using a SPC at times when her knee was acting up real bad but did not have to use her SPC all the time.      Limitations  Walking;Standing;House hold activities;Sitting    Patient Stated Goals  I just want to be able to get back to normal. (Has 5 dogs she has to take care of, do chores  around the house, be active).     Currently in Pain?  Yes    Pain Score  3     Pain Location  Knee    Pain Orientation  Right    Pain Descriptors / Indicators  Tightness    Pain Type  Surgical pain 03/21/2017    Pain Onset  More than a month ago    Pain Frequency  Intermittent       Objective:  AAROM right knee flexion 0-110and up to 115 with stretch end range post treatment  Palpation:right HY:[id number] soft tissue mobility inferior aspect right knee LEFS: 50/80 Strength: right knee extension 5/5, flexion 5/5 within available ROM; hip extension 3+/5, abduction 3+/5, ER 4+5  Treatment:  Manual therapy:28mn.: goal improve soft tissue elasticity, improve ROM right knee  patella mobilization with patella mobilizer with patient supine x10 reps for distraction and inferior glides  STM lateral and central quadriceps just superior to patella right LEandalong incision and inferior to patellaand medial, and gentle stretch x 5 reps with MFR techniques to quadriceps with patient sitting and supine with right LE supported   Therapeutic exercise: patient performed with demonstration, instruction, verbal and tactile cues of therapist: goal: independent with home program, improve LEFS, ambulation without AD   Sitting:  AAROM knee flexion right kneewith stretch end range Isometric right knee flexion with manual resistance end range to facilitate knee flexion, 10 reps, 5 second holds  Prone: Hip extension x 5 Knee flexion right LE x 5  Side lying left: performed with right LE: Clamshells with manual resistance x 15 reps  Patient response to treatment: improved flexibiltiy by 5 degrees right knee flexion following STM. Patient demonstrated improved technqiue with exercises with minimal cuing and demonstration.     PT Education - 10/03/17 1307    Education provided  Yes    Education Details  home program re assessed    Person(s) Educated  Patient    Methods   Explanation;Demonstration;Verbal cues    Comprehension  Verbalized understanding;Returned demonstration;Verbal cues required          PT Long Term Goals - 10/03/17 1407      PT LONG TERM GOAL #1   Title  Patient will be able to ambulate at least 500 ft without use of AD and no LOB to promote mobility.     Baseline  Pt currently ambulates with rw (04/12/2017); patient ambulating with SPC with pain right and left LE knees and is using rolling walker as needed (05/29/17); 07/05/17 using c;ane for support; 07/24/2017 continues with cane for safety due to left knee pain    Status  Partially Met  PT LONG TERM GOAL #2   Title  Pt will improve seated R knee extension AROM to -8 degrees or less to promote ability to ambulate, perform standing tasks.     Baseline  seated R knee extension AROM -25 degrees (04/12/2017)    Status  Achieved      PT LONG TERM GOAL #3   Title  Pt will improve seated R knee flexion AROM to at least 115 degrees to promote ability to ambulate, negiotiate steps, perform transfers with less difficulty.     Baseline  seated R knee flexion AROM 77 degrees (04/12/2017); right knee flexion 0-110/115 with AAROM    Status  Achieved      PT LONG TERM GOAL #4   Title  Pt will improve R knee flexion strength to at least 4+/5 and R knee extension strength to at least 5/5 to promote ability to ambulate, perform chores.     Baseline  right knee flexion 4-/5, knee extension improving 4-/5: (05/29/17): knee extension 4/5 with pain, knee flexion 4/5 with pain: 08/22/17: right knee flexion 4/5, extension 4/5     Status  Achieved      PT LONG TERM GOAL #5   Title  Patient will improve her LEFS score to 50 or more points as a demonstration of improved function with daily tasks.    Baseline  15/80 (04/12/2017); LEFS 05/29/17 30/80; LEFS 07/05/17 27/80; 07/24/17 36/80; 08/22/17 36/80; 10/03/2017 50/80    Status  Achieved            Plan - 10/03/17 1406    Clinical Impression Statement  Patient  has improved with ROM, strength and functional use right LE s/p TKA and is independent with home exercise program and ready for discharge to self management.     Rehab Potential  Fair    Clinical Impairments Affecting Rehab Potential  (-) healing time, pain; (+) husband support, acute condition, motivated    PT Frequency  2x / week    PT Duration  6 weeks    PT Treatment/Interventions  Neuromuscular re-education;Therapeutic exercise;Therapeutic activities;Manual techniques;Aquatic Therapy;Electrical Stimulation;Iontophoresis 32m/ml Dexamethasone;Gait training;Stair training;Balance training;Functional mobility training;Patient/family education;Scar mobilization;Passive range of motion;Dry needling    PT Next Visit Plan  discharge from physical therapy    PT Home Exercise Plan  ROM, standing hip abduction/extension; quad sets seated, knee flexion with resistive band; elevation/ use of ice for swelling/pain       Patient will benefit from skilled therapeutic intervention in order to improve the following deficits and impairments:  Pain, Difficulty walking, Decreased strength, Decreased scar mobility, Decreased range of motion  Visit Diagnosis: Stiffness of right knee, not elsewhere classified  Muscle weakness (generalized)     Problem List Patient Active Problem List   Diagnosis Date Noted  . S/P TKR (total knee replacement) using cement, right 03/21/2017  . Primary osteoarthritis of right knee 11/02/2016  . Nausea without vomiting 09/01/2016  . Chronic diarrhea 12/07/2014  . Segmental colitis with rectal bleeding ([place] 09/12/2014  . Routine general medical examination at a health care facility 06/05/2014  . Overweight 06/13/2011  . ABNORMAL CHEST XRAY 09/30/2007  . Hyperlipidemia 02/17/2007  . Essential hypertension 01/08/2007  . Osteoarthritis 01/08/2007    BJomarie LongsPT 10/03/2017, 2:10 PM  [facility] [address] [place] [address]Phone: [phone]  Fax:  [phone] Name: Stacy STAIGERMRN: [id number] of Birth: [birth year]

## 2017-10-09 HISTORY — PX: CATARACT EXTRACTION: SUR2

## 2017-10-09 HISTORY — PX: CATARACT EXTRACTION W/ INTRAOCULAR LENS IMPLANT: SHX1309

## 2017-10-19 DIAGNOSIS — H2512 Age-related nuclear cataract, left eye: Secondary | ICD-10-CM | POA: Diagnosis not present

## 2017-10-19 DIAGNOSIS — H25812 Combined forms of age-related cataract, left eye: Secondary | ICD-10-CM | POA: Diagnosis not present

## 2017-10-20 ENCOUNTER — Ambulatory Visit (INDEPENDENT_AMBULATORY_CARE_PROVIDER_SITE_OTHER): Payer: Medicare Other | Admitting: Orthopaedic Surgery

## 2017-10-23 ENCOUNTER — Encounter (INDEPENDENT_AMBULATORY_CARE_PROVIDER_SITE_OTHER): Payer: Self-pay | Admitting: Orthopaedic Surgery

## 2017-10-23 ENCOUNTER — Ambulatory Visit (INDEPENDENT_AMBULATORY_CARE_PROVIDER_SITE_OTHER): Payer: Medicare Other | Admitting: Orthopaedic Surgery

## 2017-10-23 VITALS — BP 117/84 | HR 109 | Ht 62.0 in | Wt 185.0 lb

## 2017-10-23 DIAGNOSIS — Z96651 Presence of right artificial knee joint: Secondary | ICD-10-CM

## 2017-10-23 DIAGNOSIS — G8929 Other chronic pain: Secondary | ICD-10-CM

## 2017-10-23 DIAGNOSIS — M25562 Pain in left knee: Secondary | ICD-10-CM | POA: Diagnosis not present

## 2017-10-23 MED ORDER — BUPIVACAINE HCL 0.5 % IJ SOLN
2.0000 mL | INTRAMUSCULAR | Status: AC | PRN
  Administered 2017-10-23: 2 mL via INTRA_ARTICULAR

## 2017-10-23 MED ORDER — METHYLPREDNISOLONE ACETATE 40 MG/ML IJ SUSP
80.0000 mg | INTRAMUSCULAR | Status: AC | PRN
  Administered 2017-10-23: 80 mg

## 2017-10-23 MED ORDER — LIDOCAINE HCL 1 % IJ SOLN
2.0000 mL | INTRAMUSCULAR | Status: AC | PRN
  Administered 2017-10-23: 2 mL

## 2017-10-23 NOTE — Progress Notes (Signed)
Office Visit Note   Patient: [name]           Date of Birth: July 21, 1950           MRN: [id number] Visit Date: 10/23/2017              Requested by: Hoyt Koch, MD [place], [place] [id number] PCP: Hoyt Koch, MD   Assessment & Plan: Visit Diagnoses:  1. History of total right knee replacement   2. Chronic pain of left knee     Plan: Right total knee replacement in December 2018 and doing relatively well.  Continues to work on strengthening exercises.  Has osteoarthritis left knee with some pain.  Will inject that with cortisone.  Office 3 months  Follow-Up Instructions: Return in about 3 months (around 01/23/2018).   Orders:  Orders Placed This Encounter  Procedures  . Large Joint Inj: L knee   No orders of the defined types were placed in this encounter.     Procedures: Large Joint Inj: L knee on 10/23/2017 1:26 PM Indications: pain and diagnostic evaluation Details: 25 G 1.5 in needle, anteromedial approach  Arthrogram: No  Medications: 2 mL lidocaine 1 %; 2 mL bupivacaine 0.5 %; 80 mg methylPREDNISolone acetate 40 MG/ML Procedure, treatment alternatives, risks and benefits explained, specific risks discussed. Consent was given by the patient. Patient was prepped and draped in the usual sterile fashion.       Clinical Data: No additional findings.   Subjective: Chief Complaint  Patient presents with  . Follow-up    R KNEE PAIN F/U SURGERY 03/21/17 STILL HAS STIFFNESS  7 months status post primary right total knee replacement.  Doing well without complications.  Still some weakness and occasional discomfort.  Having more pain in the left knee with prior diagnosis of osteoarthritis.  Will inject that knee today.  Uses a cane to help with ambulation outside the house.  Recently developed some symptoms of plantar fasciitis right foot.  HPI  Review of Systems  Constitutional: Negative for fatigue and fever.  HENT:  Negative for ear pain.   Eyes: Negative for pain.  Respiratory: Negative for cough and shortness of breath.   Cardiovascular: Positive for leg swelling.  Gastrointestinal: Positive for diarrhea. Negative for constipation.  Genitourinary: Negative for difficulty urinating.  Musculoskeletal: Positive for back pain. Negative for neck pain.  Skin: Negative for rash.  Allergic/Immunologic: Positive for food allergies.  Neurological: Positive for weakness. Negative for numbness.  Hematological: Does not bruise/bleed easily.  Psychiatric/Behavioral: Negative for sleep disturbance.     Objective: Vital Signs: BP 117/84 (BP Location: Left Arm, Patient Position: Sitting, Cuff Size: Normal)   Pulse (!) 109   Ht 5' 2"  (1.575 m)   Wt 185 lb (83.9 kg)   LMP 02/09/2005   BMI 33.84 kg/m   Physical Exam  Constitutional: She is oriented to person, place, and time. She appears well-developed and well-nourished.  HENT:  Mouth/Throat: Oropharynx is clear and moist.  Eyes: Pupils are equal, round, and reactive to light. EOM are normal.  Pulmonary/Chest: Effort normal.  Neurological: She is alert and oriented to person, place, and time.  Skin: Skin is warm and dry.  Psychiatric: She has a normal mood and affect. Her behavior is normal.    Ortho Exam awake alert and oriented x3.  Comfortable sitting.  Right knee with full extension and nearly 105 degrees of flexion.  Incision healed nicely.  Minimal hypertrophic changes.  No effusion.  No instability.  No calf pain or distal edema.  Neurovascular exam intact.  Left knee with some pain more lateral than medial possibly small effusion.  Does have large knees.  No instability.  No distal edema. Her films demonstrate bone-on-bone particularly in the medial compartment Specialty Comments:  No specialty comments available.  Imaging: No results found.   PMFS History: Patient Active Problem List   Diagnosis Date Noted  . S/P TKR (total knee  replacement) using cement, right 03/21/2017  . Primary osteoarthritis of right knee 11/02/2016  . Nausea without vomiting 09/01/2016  . Chronic diarrhea 12/07/2014  . Segmental colitis with rectal bleeding ([place]) 09/12/2014  . Routine general medical examination at a health care facility 06/05/2014  . Overweight 06/13/2011  . ABNORMAL CHEST XRAY 09/30/2007  . Hyperlipidemia 02/17/2007  . Essential hypertension 01/08/2007  . Osteoarthritis 01/08/2007   Past Medical History:  Diagnosis Date  . Arthritis    back, fingers with joint pain and swelling.  chronic back pain  . Chronic back pain    arthritis   . Diverticulosis   . Elevated cholesterol    takes Niacin daily and Simvastatin  . GERD (gastroesophageal reflux disease) 06/2004   non-specific gastritis on EGD 06/2004  . Headache(784.0)    occasionally  . History of colon polyps 2004, 2009   2004:adenomatous. 2009 hyperplastic.   Marland Kitchen History of hiatal hernia   . Hypertension    takes Tenoretic and Lisinopril daily  . Mucoid cyst of joint 08/2013   right index finger  . Osteopenia 01/2014   T score -1.1 FRAX 14%/0.5%. Stable from prior DEXA  . PONV (postoperative nausea and vomiting)   . Seasonal allergies   . Stroke ([place]) 1998   x 2 - mild left-sided weakness  . Urge incontinence   . Uterine prolapse     Family History  Problem Relation Age of Onset  . Hypertension Mother   . Heart disease Mother   . Diabetes Father   . Hypertension Father   . Heart disease Father   . Stroke Father   . Diabetes Maternal Aunt   . Diabetes Paternal Grandmother   . Hypertension Paternal Grandfather   . Stroke Paternal Grandfather     Past Surgical History:  Procedure Laterality Date  . BACK SURGERY     x 2  . CATARACT EXTRACTION    . CATARACT EXTRACTION Bilateral 10/2017  . COLONOSCOPY  2004, 2009, 2014  . FINGER SURGERY    . GUM SURGERY    . GYNECOLOGIC CRYOSURGERY    . HYSTEROSCOPY W/D&C  12/07/2010   with resection of  endometrial polyp  . KNEE ARTHROSCOPY Left 2008  . lip biopsy     done at MD office Fri 11/22/13  . MASS EXCISION Right 08/15/2013   Procedure: RIGHT INDEX EXCISION MASS ;  Surgeon: Tennis Must, MD;  Location: [facility];  Service: Orthopedics;  Laterality: Right;  . NASAL SEPTUM SURGERY    . OOPHORECTOMY Right 2000  . SHOULDER ARTHROSCOPY WITH OPEN ROTATOR CUFF REPAIR AND DISTAL CLAVICLE ACROMINECTOMY Right 11/26/2013   Procedure: RIGHT SHOULDER ARTHROSCOPY WITH MINI OPEN ROTATOR CUFF REPAIR AND DISTAL CLAVICLE RESECTION, SUBACROMIAL DECOMPRESSION, POSSIBLE [facility] PATCH.;  Surgeon: [name], MD;  Location: [facility];  Service: Orthopedics;  Laterality: Right;  . TOTAL KNEE ARTHROPLASTY Right 03/21/2017  . TOTAL KNEE ARTHROPLASTY Right 03/21/2017   Procedure: RIGHT TOTAL KNEE ARTHROPLASTY;  Surgeon: [name], MD;  Location:  [facility];  Service: Orthopedics;  Laterality: Right;  . TUBAL LIGATION     Social History   Occupational History  . Occupation: Animal nutritionist    Comment: Disability/Retired  Tobacco Use  . Smoking status: Passive Smoke Exposure - Never Smoker  . Smokeless tobacco: Never Used  Substance and Sexual Activity  . Alcohol use: No    Alcohol/week: 0.0 oz    Frequency: Never  . Drug use: No  . Sexual activity: Yes    Partners: Male    Birth control/protection: Surgical, Post-menopausal    Comment: BTL-1st intercourse 67 yo-More than 5 partners

## 2017-11-20 DIAGNOSIS — M722 Plantar fascial fibromatosis: Secondary | ICD-10-CM | POA: Diagnosis not present

## 2017-11-20 DIAGNOSIS — M21612 Bunion of left foot: Secondary | ICD-10-CM | POA: Diagnosis not present

## 2017-11-20 DIAGNOSIS — M71572 Other bursitis, not elsewhere classified, left ankle and foot: Secondary | ICD-10-CM | POA: Diagnosis not present

## 2017-11-20 DIAGNOSIS — M79672 Pain in left foot: Secondary | ICD-10-CM | POA: Diagnosis not present

## 2017-11-28 DIAGNOSIS — H02413 Mechanical ptosis of bilateral eyelids: Secondary | ICD-10-CM | POA: Diagnosis not present

## 2017-12-04 DIAGNOSIS — M71572 Other bursitis, not elsewhere classified, left ankle and foot: Secondary | ICD-10-CM | POA: Diagnosis not present

## 2017-12-04 DIAGNOSIS — M722 Plantar fascial fibromatosis: Secondary | ICD-10-CM | POA: Diagnosis not present

## 2017-12-14 HISTORY — PX: BELPHAROPTOSIS REPAIR: SHX369

## 2017-12-18 DIAGNOSIS — M722 Plantar fascial fibromatosis: Secondary | ICD-10-CM | POA: Diagnosis not present

## 2017-12-18 DIAGNOSIS — M71572 Other bursitis, not elsewhere classified, left ankle and foot: Secondary | ICD-10-CM | POA: Diagnosis not present

## 2017-12-20 ENCOUNTER — Other Ambulatory Visit: Payer: Self-pay

## 2017-12-20 MED ORDER — POTASSIUM CHLORIDE CRYS ER 20 MEQ PO TBCR: 20.0000 meq | EXTENDED_RELEASE_TABLET | Freq: Two times a day (BID) | ORAL | 1 refills | Status: DC

## 2017-12-21 ENCOUNTER — Telehealth: Payer: Self-pay | Admitting: Internal Medicine

## 2017-12-21 NOTE — Telephone Encounter (Signed)
Copied from [place] [phone]. Topic: Quick Communication - See Telephone Encounter >> Dec 21, 2017 10:01 AM Ahmed Prima L wrote: CRM for notification. See Telephone encounter for: 12/21/17.  Cheri Guppy, PA with [facility] called and has misplaced the document that stated she could hold her aspirin for her surgery on 9 /16. Can that be re faxed [phone], attention Cheri Guppy. She needs that by noon today. Thanks

## 2017-12-21 NOTE — Telephone Encounter (Signed)
Re-faxed for again

## 2017-12-21 NOTE — Telephone Encounter (Signed)
Re-faxed form

## 2017-12-21 NOTE — Telephone Encounter (Signed)
[name] with [facility] called and states the fax did not come through. She states the [phone] is the correct fax number and she is unsure what happened but requesting if this can be re-faxed. CB#: [phone]

## 2017-12-25 DIAGNOSIS — H02403 Unspecified ptosis of bilateral eyelids: Secondary | ICD-10-CM | POA: Diagnosis not present

## 2017-12-25 DIAGNOSIS — H02413 Mechanical ptosis of bilateral eyelids: Secondary | ICD-10-CM | POA: Diagnosis not present

## 2017-12-25 DIAGNOSIS — H02411 Mechanical ptosis of right eyelid: Secondary | ICD-10-CM | POA: Diagnosis not present

## 2017-12-25 DIAGNOSIS — H02412 Mechanical ptosis of left eyelid: Secondary | ICD-10-CM | POA: Diagnosis not present

## 2018-01-03 ENCOUNTER — Ambulatory Visit: Payer: Medicare Other | Admitting: Internal Medicine

## 2018-01-08 DIAGNOSIS — M71572 Other bursitis, not elsewhere classified, left ankle and foot: Secondary | ICD-10-CM | POA: Diagnosis not present

## 2018-01-08 DIAGNOSIS — M722 Plantar fascial fibromatosis: Secondary | ICD-10-CM | POA: Diagnosis not present

## 2018-01-09 ENCOUNTER — Encounter: Payer: Self-pay | Admitting: Internal Medicine

## 2018-01-09 ENCOUNTER — Other Ambulatory Visit (INDEPENDENT_AMBULATORY_CARE_PROVIDER_SITE_OTHER): Payer: Medicare Other

## 2018-01-09 ENCOUNTER — Ambulatory Visit (INDEPENDENT_AMBULATORY_CARE_PROVIDER_SITE_OTHER): Payer: Medicare Other | Admitting: Internal Medicine

## 2018-01-09 VITALS — BP 130/82 | HR 84 | Temp 98.2°F | Ht 62.0 in | Wt 190.0 lb

## 2018-01-09 DIAGNOSIS — K529 Noninfective gastroenteritis and colitis, unspecified: Secondary | ICD-10-CM

## 2018-01-09 DIAGNOSIS — Z0000 Encounter for general adult medical examination without abnormal findings: Secondary | ICD-10-CM | POA: Diagnosis not present

## 2018-01-09 DIAGNOSIS — E785 Hyperlipidemia, unspecified: Secondary | ICD-10-CM | POA: Diagnosis not present

## 2018-01-09 DIAGNOSIS — I10 Essential (primary) hypertension: Secondary | ICD-10-CM | POA: Diagnosis not present

## 2018-01-09 DIAGNOSIS — K50111 Crohn's disease of large intestine with rectal bleeding: Secondary | ICD-10-CM | POA: Diagnosis not present

## 2018-01-09 DIAGNOSIS — R7301 Impaired fasting glucose: Secondary | ICD-10-CM | POA: Diagnosis not present

## 2018-01-09 LAB — CBC
HCT: 41.7 % (ref 36.0–46.0)
HEMOGLOBIN: 14.2 g/dL (ref 12.0–15.0)
MCHC: 34.1 g/dL (ref 30.0–36.0)
MCV: 87.9 fl (ref 78.0–100.0)
Platelets: 223 10*3/uL (ref 150.0–400.0)
RBC: 4.74 Mil/uL (ref 3.87–5.11)
RDW: 13.8 % (ref 11.5–15.5)
WBC: 11.5 10*3/uL — ABNORMAL HIGH (ref 4.0–10.5)

## 2018-01-09 LAB — COMPREHENSIVE METABOLIC PANEL
ALK PHOS: 59 U/L (ref 39–117)
ALT: 19 U/L (ref 0–35)
AST: 24 U/L (ref 0–37)
Albumin: 4.1 g/dL (ref 3.5–5.2)
BUN: 17 mg/dL (ref 6–23)
CO2: 28 mEq/L (ref 19–32)
Calcium: 9.9 mg/dL (ref 8.4–10.5)
Chloride: 106 mEq/L (ref 96–112)
Creatinine, Ser: 0.99 mg/dL (ref 0.40–1.20)
GFR: 59.37 mL/min — ABNORMAL LOW (ref >60.00)
GLUCOSE: 100 mg/dL — AB (ref 70–99)
Potassium: 4 mEq/L (ref 3.5–5.1)
Sodium: 142 mEq/L (ref 135–145)
TOTAL PROTEIN: 7.3 g/dL (ref 6.0–8.3)
Total Bilirubin: 0.4 mg/dL (ref 0.2–1.2)

## 2018-01-09 LAB — LIPID PANEL
Cholesterol: 179 mg/dL (ref 0–200)
HDL: 40.4 mg/dL (ref >39.00)
LDL Cholesterol: 117 mg/dL — ABNORMAL HIGH (ref 0–99)
NONHDL: 138.7
Total CHOL/HDL Ratio: 4
Triglycerides: 107 mg/dL (ref 0.0–149.0)
VLDL: 21.4 mg/dL (ref 0.0–40.0)

## 2018-01-09 LAB — HEMOGLOBIN A1C: Hgb A1c MFr Bld: 5.7 % (ref 4.6–6.5)

## 2018-01-09 MED ORDER — LISINOPRIL 2.5 MG PO TABS: 2.5000 mg | ORAL_TABLET | Freq: Every day | ORAL | 3 refills | Status: DC

## 2018-01-09 MED ORDER — METHOCARBAMOL 500 MG PO TABS: ORAL_TABLET | ORAL | 2 refills | Status: DC

## 2018-01-09 MED ORDER — SIMVASTATIN 20 MG PO TABS: 20.0000 mg | ORAL_TABLET | Freq: Every day | ORAL | 3 refills | Status: DC

## 2018-01-09 MED ORDER — POTASSIUM CHLORIDE CRYS ER 20 MEQ PO TBCR: 20.0000 meq | EXTENDED_RELEASE_TABLET | Freq: Two times a day (BID) | ORAL | 3 refills | Status: DC

## 2018-01-09 MED ORDER — ATENOLOL-CHLORTHALIDONE 50-25 MG PO TABS: 1.0000 | ORAL_TABLET | Freq: Every day | ORAL | 3 refills | Status: DC

## 2018-01-09 NOTE — Patient Instructions (Signed)

## 2018-01-09 NOTE — Progress Notes (Signed)
   Subjective:    Patient ID: [name], female    DOB: 08/31/1950, 67 y.o.   MRN: [id number]  HPI Here for medicare wellness, no new complaints. Please see A/P for status and treatment of chronic medical problems.   HPI #2: here for follow up hypertension (BP at goal on lisinopril and atenolol/hclorthalidone, denies headaches or chest pains), diarrhea (sometimes uses lomotil and hyoscyamine when needed, has been stable since last time, denies blood in diarrhea) and hyperlipidemia (taking simvastatin, denies side effects, needs follow up cholesterol check, denies diet or exercise changes).   Diet: heart healthy Physical activity: sedentary Depression/mood screen: negative Hearing: intact to whispered voice Visual acuity: grossly normal, performs annual eye exam  ADLs: capable Fall risk: none Home safety: good Cognitive evaluation: intact to orientation, naming, recall and repetition EOL planning: adv directives discussed  I have personally reviewed and have noted 1. The patient's medical and social history - reviewed today no changes 2. Their use of alcohol, tobacco or illicit drugs 3. Their current medications and supplements 4. The patient's functional ability including ADL's, fall risks, home safety risks and hearing or visual impairment. 5. Diet and physical activities 6. Evidence for depression or mood disorders 7. Care team reviewed and updated (available in snapshot)  Review of Systems  Constitutional: Negative.   HENT: Negative.   Eyes: Negative.   Respiratory: Negative for cough, chest tightness and shortness of breath.   Cardiovascular: Negative for chest pain, palpitations and leg swelling.  Gastrointestinal: Negative for abdominal distention, abdominal pain, constipation, diarrhea, nausea and vomiting.  Musculoskeletal: Negative.   Skin: Negative.   Neurological: Negative.   Psychiatric/Behavioral: Negative.       Objective:   Physical Exam    Constitutional: She is oriented to person, place, and time. She appears well-developed and well-nourished.  HENT:  Head: Normocephalic and atraumatic.  Eyes: EOM are normal.  Neck: Normal range of motion.  Cardiovascular: Normal rate and regular rhythm.  Pulmonary/Chest: Effort normal and breath sounds normal. No respiratory distress. She has no wheezes. She has no rales.  Abdominal: Soft. Bowel sounds are normal. She exhibits no distension. There is no tenderness. There is no rebound.  Musculoskeletal: She exhibits no edema.  Neurological: She is alert and oriented to person, place, and time. Coordination normal.  Skin: Skin is warm and dry.  Psychiatric: She has a normal mood and affect.   Vitals:   01/09/18 1044  BP: 130/82  Pulse: 84  Temp: 98.2 F (36.8 C)  TempSrc: Oral  SpO2: 96%  Weight: 190 lb (86.2 kg)  Height: 5' 2"  (1.575 m)      Assessment & Plan:

## 2018-01-12 NOTE — Assessment & Plan Note (Signed)
Using lomotil or hyoscyamine when needed. Symptoms ebb and flow with her bowels and she is working on dietary changes to see if this helps as well. Sees GI.

## 2018-01-12 NOTE — Assessment & Plan Note (Signed)
Usee lomotil rarely and hyoscyamine rarely.

## 2018-01-12 NOTE — Assessment & Plan Note (Signed)
Flu shot declines. Pneumonia completed. Shingrix declines. Tetanus up to date. Colonoscopy needed. Mammogram up to date, pap smear aged out and dexa up to date. Counseled about sun safety and mole surveillance. Counseled about the dangers of distracted driving. Given 10 year screening recommendations.

## 2018-01-12 NOTE — Assessment & Plan Note (Signed)
Checking lipid panel and adjust simvastatin as needed.

## 2018-01-12 NOTE — Assessment & Plan Note (Signed)
BP at goal on atenolol/chlorthalidone and lisinopril. Denies side effects. Checking CMP and adjust if needed.

## 2018-01-22 ENCOUNTER — Encounter (INDEPENDENT_AMBULATORY_CARE_PROVIDER_SITE_OTHER): Payer: Self-pay | Admitting: Orthopaedic Surgery

## 2018-01-22 ENCOUNTER — Ambulatory Visit (INDEPENDENT_AMBULATORY_CARE_PROVIDER_SITE_OTHER): Payer: Medicare Other | Admitting: Orthopaedic Surgery

## 2018-01-22 VITALS — BP 129/74 | HR 118 | Resp 16 | Ht 62.0 in | Wt 190.0 lb

## 2018-01-22 DIAGNOSIS — Z96651 Presence of right artificial knee joint: Secondary | ICD-10-CM | POA: Diagnosis not present

## 2018-01-22 NOTE — Progress Notes (Signed)
Office Visit Note   Patient: [name]           Date of Birth: 18-Jun-1950           MRN: [id number] Visit Date: 01/22/2018              Requested by: Hoyt Koch, MD [place], [place] [id number] PCP: Hoyt Koch, MD   Assessment & Plan: Visit Diagnoses:  1. History of total right knee replacement     Plan: Proximately 10 months status post primary right total knee replacement and improving.  Still has some areas of decreased sensibility on the lateral aspect of her knee and a feeling of a "band" at the knee joint but otherwise improving.  She feels she that she is better than she was before surgery.  Will urged her to continue with her exercises and return to see me as needed.  Left knee feeling better after cortisone injection 3 months ago  Follow-Up Instructions: Return if symptoms worsen or fail to improve.   Orders:  No orders of the defined types were placed in this encounter.  No orders of the defined types were placed in this encounter.     Procedures: No procedures performed   Clinical Data: No additional findings.   Subjective: Chief Complaint  Patient presents with  . Right Knee - Follow-up  . Knee Pain    Right knee stiffness, numbness  10 months status post primary right total knee replacement and no related complications other than some decreased sensibility along the lateral aspect of her knee and a feeling of a "band" along the joint line.  This is no change from her last visit.  Has been working on the bike strengthening.  No feeling of instability  HPI  Review of Systems  Constitutional: Positive for fatigue.  HENT: Negative for trouble swallowing.   Eyes: Negative for pain.  Respiratory: Negative for shortness of breath.   Cardiovascular: Positive for leg swelling.  Gastrointestinal: Negative for constipation.  Endocrine: Negative for cold intolerance.  Genitourinary: Negative for difficulty urinating.    Musculoskeletal: Positive for joint swelling.  Skin: Negative for rash.  Allergic/Immunologic: Positive for food allergies.  Neurological: Positive for numbness.  Hematological: Does not bruise/bleed easily.  Psychiatric/Behavioral: Negative for sleep disturbance.     Objective: Vital Signs: BP 129/74 (BP Location: Right Arm, Patient Position: Sitting, Cuff Size: Normal)   Pulse (!) 118   Resp 16   Ht 5' 2"  (1.575 m)   Wt 190 lb (86.2 kg)   LMP 02/09/2005   BMI 34.75 kg/m   Physical Exam  Constitutional: She is oriented to person, place, and time. She appears well-developed and well-nourished.  HENT:  Mouth/Throat: Oropharynx is clear and moist.  Eyes: Pupils are equal, round, and reactive to light. EOM are normal.  Pulmonary/Chest: Effort normal.  Neurological: She is alert and oriented to person, place, and time.  Skin: Skin is warm and dry.  Psychiatric: She has a normal mood and affect. Her behavior is normal.    Ortho Exam awake alert and oriented x3.  Comfortable sitting.  Full quick extension of left knee and about 95 degrees of flexion.  No instability.  No calf pain.  Skin intact.  No effusion.  No significant hypertrophy about the joint.  Incision is healed very nicely.  No localized areas of tenderness.  Some decreased sensibility along the lateral aspect of her knee extending into the very proximal lateral  tibia.  Neurovascular exam intact distally.  Definitely better quad strength from her last evaluation  Specialty Comments:  No specialty comments available.  Imaging: No results found.   PMFS History: Patient Active Problem List   Diagnosis Date Noted  . History of total right knee replacement 03/21/2017  . Chronic diarrhea 12/07/2014  . Segmental colitis with rectal bleeding ([place]) 09/12/2014  . Routine general medical examination at a health care facility 06/05/2014  . Overweight 06/13/2011  . ABNORMAL CHEST XRAY 09/30/2007  . Hyperlipidemia 02/17/2007   . Essential hypertension 01/08/2007  . Osteoarthritis 01/08/2007   Past Medical History:  Diagnosis Date  . Arthritis    back, fingers with joint pain and swelling.  chronic back pain  . Chronic back pain    arthritis   . Diverticulosis   . Elevated cholesterol    takes Niacin daily and Simvastatin  . GERD (gastroesophageal reflux disease) 06/2004   non-specific gastritis on EGD 06/2004  . Headache(784.0)    occasionally  . History of colon polyps 2004, 2009   2004:adenomatous. 2009 hyperplastic.   Marland Kitchen History of hiatal hernia   . Hypertension    takes Tenoretic and Lisinopril daily  . Mucoid cyst of joint 08/2013   right index finger  . Osteopenia 01/2014   T score -1.1 FRAX 14%/0.5%. Stable from prior DEXA  . PONV (postoperative nausea and vomiting)   . Seasonal allergies   . Stroke ([place]) 1998   x 2 - mild left-sided weakness  . Urge incontinence   . Uterine prolapse     Family History  Problem Relation Age of Onset  . Hypertension Mother   . Heart disease Mother   . Diabetes Father   . Hypertension Father   . Heart disease Father   . Stroke Father   . Diabetes Maternal Aunt   . Diabetes Paternal Grandmother   . Hypertension Paternal Grandfather   . Stroke Paternal Grandfather     Past Surgical History:  Procedure Laterality Date  . BACK SURGERY     x 2  . BELPHAROPTOSIS REPAIR Bilateral 12/14/2017  . CATARACT EXTRACTION    . CATARACT EXTRACTION Bilateral 10/2017  . COLONOSCOPY  2004, 2009, 2014  . FINGER SURGERY    . GUM SURGERY    . GYNECOLOGIC CRYOSURGERY    . HYSTEROSCOPY W/D&C  12/07/2010   with resection of endometrial polyp  . KNEE ARTHROSCOPY Left 2008  . lip biopsy     done at MD office Fri 11/22/13  . MASS EXCISION Right 08/15/2013   Procedure: RIGHT INDEX EXCISION MASS ;  Surgeon: Tennis Must, MD;  Location: [facility];  Service: Orthopedics;  Laterality: Right;  . NASAL SEPTUM SURGERY    . OOPHORECTOMY Right 2000  . SHOULDER  ARTHROSCOPY WITH OPEN ROTATOR CUFF REPAIR AND DISTAL CLAVICLE ACROMINECTOMY Right 11/26/2013   Procedure: RIGHT SHOULDER ARTHROSCOPY WITH MINI OPEN ROTATOR CUFF REPAIR AND DISTAL CLAVICLE RESECTION, SUBACROMIAL DECOMPRESSION, POSSIBLE [facility] PATCH.;  Surgeon: [name], MD;  Location: [place];  Service: Orthopedics;  Laterality: Right;  . TOTAL KNEE ARTHROPLASTY Right 03/21/2017  . TOTAL KNEE ARTHROPLASTY Right 03/21/2017   Procedure: RIGHT TOTAL KNEE ARTHROPLASTY;  Surgeon: [name], MD;  Location: [facility];  Service: Orthopedics;  Laterality: Right;  . TUBAL LIGATION     Social History   Occupational History  . Occupation: Animal nutritionist    Comment: Disability/Retired  Tobacco Use  . Smoking status: Passive Smoke  Exposure - Never Smoker  . Smokeless tobacco: Never Used  Substance and Sexual Activity  . Alcohol use: No    Alcohol/week: 0.0 standard drinks    Frequency: Never  . Drug use: No  . Sexual activity: Yes    Partners: Male    Birth control/protection: Surgical, Post-menopausal    Comment: BTL-1st intercourse 67 yo-More than 5 partners

## 2018-01-29 ENCOUNTER — Other Ambulatory Visit: Payer: Self-pay | Admitting: Internal Medicine

## 2018-01-29 ENCOUNTER — Ambulatory Visit (INDEPENDENT_AMBULATORY_CARE_PROVIDER_SITE_OTHER): Payer: Medicare Other | Admitting: Orthopaedic Surgery

## 2018-01-29 NOTE — Telephone Encounter (Signed)
Last filled 07/27/16. Please advise.

## 2018-01-30 NOTE — Telephone Encounter (Signed)
MD approved and sent electronically to [email or web address]  

## 2018-02-05 DIAGNOSIS — M722 Plantar fascial fibromatosis: Secondary | ICD-10-CM | POA: Diagnosis not present

## 2018-02-15 ENCOUNTER — Ambulatory Visit: Payer: Medicare Other | Attending: Podiatry | Admitting: Physical Therapy

## 2018-02-15 ENCOUNTER — Encounter: Payer: Self-pay | Admitting: Physical Therapy

## 2018-02-15 DIAGNOSIS — M25661 Stiffness of right knee, not elsewhere classified: Secondary | ICD-10-CM | POA: Insufficient documentation

## 2018-02-15 DIAGNOSIS — M6281 Muscle weakness (generalized): Secondary | ICD-10-CM | POA: Insufficient documentation

## 2018-02-15 DIAGNOSIS — M25572 Pain in left ankle and joints of left foot: Secondary | ICD-10-CM | POA: Diagnosis not present

## 2018-02-15 DIAGNOSIS — R262 Difficulty in walking, not elsewhere classified: Secondary | ICD-10-CM | POA: Diagnosis not present

## 2018-02-15 NOTE — Therapy (Signed)
[facility] [address], [place], [address] Phone: [phone]   Fax:  [phone]  Physical Therapy Evaluation  Patient Details  Name: [name] MRN: [id number] Date of Birth: [birth year] Referring Provider (PT): Lenon Oms MD   Encounter Date: 02/15/2018  PT End of Session - 02/15/18 1646    Visit Number  1    Number of Visits  17    Date for PT Re-Evaluation  04/12/18    PT Start Time  0315    PT Stop Time  0415    PT Time Calculation (min)  60 min    Activity Tolerance  Patient tolerated treatment well    Behavior During Therapy  [facility] for tasks assessed/performed       Past Medical History:  Diagnosis Date  . Arthritis    back, fingers with joint pain and swelling.  chronic back pain  . Chronic back pain    arthritis   . Diverticulosis   . Elevated cholesterol    takes Niacin daily and Simvastatin  . GERD (gastroesophageal reflux disease) 06/2004   non-specific gastritis on EGD 06/2004  . Headache(784.0)    occasionally  . History of colon polyps 2004, 2009   2004:adenomatous. 2009 hyperplastic.   Marland Kitchen History of hiatal hernia   . Hypertension    takes Tenoretic and Lisinopril daily  . Mucoid cyst of joint 08/2013   right index finger  . Osteopenia 01/2014   T score -1.1 FRAX 14%/0.5%. Stable from prior DEXA  . PONV (postoperative nausea and vomiting)   . Seasonal allergies   . Stroke ([place]) 1998   x 2 - mild left-sided weakness  . Urge incontinence   . Uterine prolapse     Past Surgical History:  Procedure Laterality Date  . BACK SURGERY     x 2  . BELPHAROPTOSIS REPAIR Bilateral 12/14/2017  . CATARACT EXTRACTION    . CATARACT EXTRACTION Bilateral 10/2017  . COLONOSCOPY  2004, 2009, 2014  . FINGER SURGERY    . GUM SURGERY    . GYNECOLOGIC CRYOSURGERY    . HYSTEROSCOPY W/D&C  12/07/2010   with resection of endometrial polyp  . KNEE ARTHROSCOPY Left 2008  . lip biopsy      done at MD office Fri 11/22/13  . MASS EXCISION Right 08/15/2013   Procedure: RIGHT INDEX EXCISION MASS ;  Surgeon: Tennis Must, MD;  Location: [place];  Service: Orthopedics;  Laterality: Right;  . NASAL SEPTUM SURGERY    . OOPHORECTOMY Right 2000  . SHOULDER ARTHROSCOPY WITH OPEN ROTATOR CUFF REPAIR AND DISTAL CLAVICLE ACROMINECTOMY Right 11/26/2013   Procedure: RIGHT SHOULDER ARTHROSCOPY WITH MINI OPEN ROTATOR CUFF REPAIR AND DISTAL CLAVICLE RESECTION, SUBACROMIAL DECOMPRESSION, POSSIBLE [facility] PATCH.;  Surgeon: [name], MD;  Location: [place];  Service: Orthopedics;  Laterality: Right;  . TOTAL KNEE ARTHROPLASTY Right 03/21/2017  . TOTAL KNEE ARTHROPLASTY Right 03/21/2017   Procedure: RIGHT TOTAL KNEE ARTHROPLASTY;  Surgeon: [name], MD;  Location: [place];  Service: Orthopedics;  Laterality: Right;  . TUBAL LIGATION      There were no vitals filed for this visit.   Subjective Assessment - 02/15/18 1523    Subjective  L foot pain    Pertinent History  Patient is a 67 year old female presenting today with L dorsum foot pain. Patient reports this pain began 3 months ago. Patient with PMH of R TKA, where  she was seen here for PT, and reports she was "favoring the L side" and thinks this is where her pain began from. Patient reports over the past 2 months she has had 4 injections and is using costum orthotics, which she reports has helped with her pain 65%. Patient reports with prolonged walking she has a "little pain" if she is wearing her orthotics, and increased pain when she is not wearing her orthotics (which do not go in all of her shoes). Patient is retired, but reports she is unable to complete hobbies of hunting, kayaking, biking, and playing with dogs. Patient reports pain "any time she is on her feet". Patient reports worst pain in the past week : 4/10 ; best : 1/10. Pt denies N/V, B&B changes, unexplained weight fluctuation, saddle paresthesia, fever,  night sweats, or unrelenting night pain at this time.    Limitations  Standing;Walking;House hold activities    How long can you sit comfortably?  unlimited    How long can you stand comfortably?  w/0 orthotic 46mn    How long can you walk comfortably?  w/0 orthotic 176m    Diagnostic tests  None yet    Patient Stated Goals  Wants to go to hunting and kayaking, and walk her 5 dogs.     Currently in Pain?  Yes    Pain Score  2     Pain Location  Foot    Pain Orientation  Left;Other (Comment)   dorsum of foot between arch and heel   Pain Descriptors / Indicators  Sharp    Pain Type  Chronic pain    Pain Radiating Towards  None    Pain Onset  More than a month ago    Pain Frequency  Intermittent    Aggravating Factors   Standing, walking    Pain Relieving Factors  Orthotic, injections    Effect of Pain on Daily Activities  Unable to play with dogs, kayak or hunt         OBJECTIVE  MUSCULOSKELETAL: Tremor: Absent Bulk: Normal Tone: Normal, no clonus No trophic changes noted to foot/ankle. No ecchymosis, erythema, or edema noted. No gross ankle/foot deformity noted  Lumbar/Hip/Knee Screen AROM: WFL and painless with overpressure in all planes; EXCEPT R knee flexion with hard end feel  Posture B/l pronation with calcaneal valgus- slightly improved with glute activation  Gait Decreased push off bilat with eversion compensation. B/L [place]. Decreased hip ext throughout gait  Stairs: patient with step to pattern leading with LLE with ascent and RLE with descent  Palpation No pain to palpation along medial and lateral malleoli. Achilles tendon intact and painless to palpation. Pain to palpation at dorsum of calcaneous and along plantar fascia  Strength R/L 5/5 Hip flexion 5/5 Hip external rotation 5/5 Hip internal rotation 4-/4- Hip extension  4/4 Hip abduction 4+/5 Knee extension 5/5 Knee flexion 5/5 Ankle Plantarflexion 5/5 Ankle Dorsiflexion 4/5 Ankle  Inversion 5/5 Ankle Eversion  AROM R/L 96d/ 115d Knee Flexion -3d/0d Knee Ext 60d/60d Ankle Plantarflexion 12d/10d Ankle Dorsiflexion 40d/35d Ankle Inversion 28/25 Ankle Eversion *Indicates Pain  PROM R/L 65d/63d Ankle Plantarflexion 13d/11d Ankle Dorsiflexion 41d/37d Ankle Inversion 36/35 Ankle Eversion *Indicates Pain  Passive Accessory Motion Superior Tibiofibular Joint: WNL Inferior Tibiofibular Joint: WNL Talocrural Joint Distraction: WNL Talocrural Joint AP: soft end feel Talocrural Joint PA: WNL  Sensation Grossly intact to light touch bilateral LEs as determined by testing dermatomes L2-S2 Proprioception and hot/cold testing deferred on this date  SPECIAL TESTS  Achilles Integrity (-) Thompson Test  Fracture Screening (-) Metatarsal Axial Loading: (-) Tap/Percussion Test:    Pronation/Supination (+ )Navicular Drop on R (-) L  Other (+) Windlass Mechanism Test on L (-) on R  FUNCTIONAL TESTS 5x Sit to stand: 18sec Squat: patient able to only complete mini squat d/t R knee pain; inc foot pronation with slight knee valgus b/l with squat Lateral Step Down Patient unable to complete more than 1in lowering before hi pdrop 10MWT 0.60ms  Ther-Ex - Great toe stretch x 30sec hold - DF stretch seated and in standing 30sec hold - Supine bridge attempted with hamstring discomfort- attempted post x10 glute set, able to complete 5 with proper form. (HEP)  Objective measurements completed on examination: See above findings.                    PT Education - 02/15/18 1534    Education provided  Yes    Education Details  Patient was educated on diagnosis, anatomy and pathology involved, prognosis, role of PT, and was given an HEP, demonstrating exercise with proper form following verbal and tactile cues, and was given a paper hand out to continue exercise at home. Pt was educated on and agreed to plan of care.    Person(s) Educated  Patient     Methods  Explanation;Handout;Verbal cues;Tactile cues;Demonstration    Comprehension  Verbalized understanding;Returned demonstration;Verbal cues required;Tactile cues required       PT Short Term Goals - 02/15/18 1648      PT SHORT TERM GOAL #1   Title  Pt will be independent with HEP in order to improve strength and balance in order to decrease fall risk and improve function at home and work    Time  4    Period  Weeks    Status  New        PT Long Term Goals - 02/15/18 1648      PT LONG TERM GOAL #1   Title  Pt will increase 10MWT by at least 0.13 m/s in order to demonstrate clinically significant improvement in community ambulation    Baseline  02/15/18: 0.831m    Time  8    Period  Weeks    Status  New      PT LONG TERM GOAL #2   Title  Pt will decrease 5TSTS by at least 3 seconds in order to demonstrate clinically significant improvement in LE strength    Baseline  02/15/18: 8sec    Time  8    Period  Weeks    Status  New      PT LONG TERM GOAL #3   Title  Pt will decrease worst pain as reported on NPRS by at least 3 points in order to demonstrate clinically significant reduction in pain.    Baseline  02/15/18 4/10    Time  8    Period  Weeks    Status  New      PT LONG TERM GOAL #4   Title  Patient will increase FOTO score to 56 to demonstrate predicted increase in functional mobility to complete ADLs    Baseline  02/15/18 45    Time  8    Period  Weeks    Status  New             Plan - 02/16/18 0931    Clinical Impression Statement  Patient is a [age]ear old female presenting with foot  pain signs and symptoms consistent with plantar fasciitis. Patient with impairments in L foot pain, soft tissue restriction, postural deficits, that are limiting WB activities (standing/walking). D/t limitations patient is unable to fully participate in ADLs (standing to cook a meal, run errands) and in her outdoor hobbies of hunting/dog walking/kayaking. Would benefit from  skilled PT to address above deficits and promote optimal return to PLOF    Rehab Potential  Fair    Clinical Impairments Affecting Rehab Potential  (-) healing time, pain; (+) husband support, acute condition, motivated    PT Frequency  2x / week    PT Duration  6 weeks    PT Treatment/Interventions  Neuromuscular re-education;Therapeutic exercise;Therapeutic activities;Manual techniques;Aquatic Therapy;Electrical Stimulation;Iontophoresis 68m/ml Dexamethasone;Gait training;Stair training;Balance training;Functional mobility training;Patient/family education;Scar mobilization;Passive range of motion;Dry needling    PT Next Visit Plan  discharge from physical therapy    PT Home Exercise Plan  ROM, standing hip abduction/extension; quad sets seated, knee flexion with resistive band; elevation/ use of ice for swelling/pain    Consulted and Agree with Plan of Care  Patient       Patient will benefit from skilled therapeutic intervention in order to improve the following deficits and impairments:  Pain, Difficulty walking, Decreased strength, Decreased scar mobility, Decreased range of motion  Visit Diagnosis: Stiffness of right knee, not elsewhere classified     Problem List Patient Active Problem List   Diagnosis Date Noted  . History of total right knee replacement 03/21/2017  . Chronic diarrhea 12/07/2014  . Segmental colitis with rectal bleeding ([place] 09/12/2014  . Routine general medical examination at a health care facility 06/05/2014  . Overweight 06/13/2011  . ABNORMAL CHEST XRAY 09/30/2007  . Hyperlipidemia 02/17/2007  . Essential hypertension 01/08/2007  . Osteoarthritis 01/08/2007   CShelton SilvasPT, DPT CShelton Silvas11/11/2017, 9:45 AM  [facility] [address] [place] [address]Phone: [phone]  Fax:  [phone] Name: Stacy COBERNMRN: [id number] of Birth: 323-Nov-1952

## 2018-02-19 ENCOUNTER — Ambulatory Visit: Payer: Medicare Other | Admitting: Physical Therapy

## 2018-02-19 ENCOUNTER — Encounter: Payer: Self-pay | Admitting: Physical Therapy

## 2018-02-19 DIAGNOSIS — M25661 Stiffness of right knee, not elsewhere classified: Secondary | ICD-10-CM

## 2018-02-19 DIAGNOSIS — M6281 Muscle weakness (generalized): Secondary | ICD-10-CM | POA: Diagnosis not present

## 2018-02-19 DIAGNOSIS — R262 Difficulty in walking, not elsewhere classified: Secondary | ICD-10-CM | POA: Diagnosis not present

## 2018-02-19 DIAGNOSIS — M25572 Pain in left ankle and joints of left foot: Secondary | ICD-10-CM | POA: Diagnosis not present

## 2018-02-19 NOTE — Therapy (Signed)
[facility] [address] [place], [address] Phone: [phone]   Fax:  [phone]  Physical Therapy Treatment  Patient Details  Name: [name] MRN: [id number] Date of Birth: 07/09/1950 Referring Provider (PT): Lenon Oms MD   Encounter Date: 02/19/2018  PT End of Session - 02/19/18 1420    Visit Number  2    Number of Visits  17    Date for PT Re-Evaluation  04/12/18    PT Start Time  0145    PT Stop Time  0230    PT Time Calculation (min)  45 min    Activity Tolerance  Patient tolerated treatment well    Behavior During Therapy  [facility] for tasks assessed/performed       Past Medical History:  Diagnosis Date  . Arthritis    back, fingers with joint pain and swelling.  chronic back pain  . Chronic back pain    arthritis   . Diverticulosis   . Elevated cholesterol    takes Niacin daily and Simvastatin  . GERD (gastroesophageal reflux disease) 06/2004   non-specific gastritis on EGD 06/2004  . Headache(784.0)    occasionally  . History of colon polyps 2004, 2009   2004:adenomatous. 2009 hyperplastic.   Marland Kitchen History of hiatal hernia   . Hypertension    takes Tenoretic and Lisinopril daily  . Mucoid cyst of joint 08/2013   right index finger  . Osteopenia 01/2014   T score -1.1 FRAX 14%/0.5%. Stable from prior DEXA  . PONV (postoperative nausea and vomiting)   . Seasonal allergies   . Stroke ([place] 1998   x 2 - mild left-sided weakness  . Urge incontinence   . Uterine prolapse     Past Surgical History:  Procedure Laterality Date  . BACK SURGERY     x 2  . BELPHAROPTOSIS REPAIR Bilateral 12/14/2017  . CATARACT EXTRACTION    . CATARACT EXTRACTION Bilateral 10/2017  . COLONOSCOPY  2004, 2009, 2014  . FINGER SURGERY    . GUM SURGERY    . GYNECOLOGIC CRYOSURGERY    . HYSTEROSCOPY W/D&C  12/07/2010   with resection of endometrial polyp  . KNEE ARTHROSCOPY Left 2008  . lip biopsy      done at MD office Fri 11/22/13  . MASS EXCISION Right 08/15/2013   Procedure: RIGHT INDEX EXCISION MASS ;  Surgeon: Tennis Must, MD;  Location: [place];  Service: Orthopedics;  Laterality: Right;  . NASAL SEPTUM SURGERY    . OOPHORECTOMY Right 2000  . SHOULDER ARTHROSCOPY WITH OPEN ROTATOR CUFF REPAIR AND DISTAL CLAVICLE ACROMINECTOMY Right 11/26/2013   Procedure: RIGHT SHOULDER ARTHROSCOPY WITH MINI OPEN ROTATOR CUFF REPAIR AND DISTAL CLAVICLE RESECTION, SUBACROMIAL DECOMPRESSION, POSSIBLE [facility] PATCH.;  Surgeon: [name], MD;  Location: [place];  Service: Orthopedics;  Laterality: Right;  . TOTAL KNEE ARTHROPLASTY Right 03/21/2017  . TOTAL KNEE ARTHROPLASTY Right 03/21/2017   Procedure: RIGHT TOTAL KNEE ARTHROPLASTY;  Surgeon: [name], MD;  Location: [place];  Service: Orthopedics;  Laterality: Right;  . TUBAL LIGATION      There were no vitals filed for this visit.  Subjective Assessment - 02/19/18 1353    Subjective  Patient reports she had some increased pain over the weekend as she was out hunting and up on her feet with her grandchildern a lot. Patient reports 3/10 pain in the L foot today. Patient reports some compliance with  her HEP.     Pertinent History  Patient is a 67 year old female presenting today with L dorsum foot pain. Patient reports this pain began 3 months ago. Patient with PMH of R TKA, where she was seen here for PT, and reports she was "favoring the L side" and thinks this is where her pain began from. Patient reports over the past 2 months she has had 4 injections and is using costum orthotics, which she reports has helped with her pain 65%. Patient reports with prolonged walking she has a "little pain" if she is wearing her orthotics, and increased pain when she is not wearing her orthotics (which do not go in all of her shoes). Patient is retired, but reports she is unable to complete hobbies of hunting, kayaking, biking, and playing  with dogs. Patient reports pain "any time she is on her feet". Patient reports worst pain in the past week : 4/10 ; best : 1/10. Pt denies N/V, B&B changes, unexplained weight fluctuation, saddle paresthesia, fever, night sweats, or unrelenting night pain at this time.    Limitations  Standing;Walking;House hold activities    How long can you sit comfortably?  unlimited    How long can you stand comfortably?  w/0 orthotic 40mn    How long can you walk comfortably?  w/0 orthotic 120m    Diagnostic tests  None yet    Patient Stated Goals  Wants to go to hunting and kayaking, and walk her 5 dogs.     Pain Onset  More than a month ago           Manual - STM with trigger point release to L gastroc; utilizing manual and rolling pin  - STM with trigger point release to dorsum of foot - Manual DF stretch x2 30sec hold - Manual great toe stretch x2 30sec hold - AP mob with DF grade III-IV 30sec bouts; 8 bouts for inc DF rom   Ther-Ex - Bridge exercise 3x 8 during first set patient reports LLE hamstring "cramp", following manual hamstring stretch and glute set patient is able to finish set - Prone SL lifts 2x 10 b/l with TC initially to prevent hip rotation compensation with good carry over following.  - RTB resisted supination 3x 10 - GT stretch 30sec hold (HEP review, good carry over) - DF stretch 30sec hold (HEP review, good carry over)                 PT Education - 02/19/18 1414    Education provided  Yes    Education Details  DN education as possible intervention for soft tissue tension, exercise form    Person(s) Educated  Patient    Methods  Explanation;Tactile cues;Demonstration;Verbal cues    Comprehension  Verbalized understanding;Returned demonstration;Verbal cues required;Tactile cues required       PT Short Term Goals - 02/15/18 1648      PT SHORT TERM GOAL #1   Title  Pt will be independent with HEP in order to improve strength and balance in order to  decrease fall risk and improve function at home and work    Time  4    Period  Weeks    Status  New        PT Long Term Goals - 02/15/18 1648      PT LONG TERM GOAL #1   Title  Pt will increase 10MWT by at least 0.13 m/s in order to demonstrate clinically significant improvement  in community ambulation    Baseline  02/15/18: 0.27ms    Time  8    Period  Weeks    Status  New      PT LONG TERM GOAL #2   Title  Pt will decrease 5TSTS by at least 3 seconds in order to demonstrate clinically significant improvement in LE strength    Baseline  02/15/18: 8sec    Time  8    Period  Weeks    Status  New      PT LONG TERM GOAL #3   Title  Pt will decrease worst pain as reported on NPRS by at least 3 points in order to demonstrate clinically significant reduction in pain.    Baseline  02/15/18 4/10    Time  8    Period  Weeks    Status  New      PT LONG TERM GOAL #4   Title  Patient will increase FOTO score to 56 to demonstrate predicted increase in functional mobility to complete ADLs    Baseline  02/15/18 45    Time  8    Period  Weeks    Status  New            Plan - 02/19/18 1429    Clinical Impression Statement  Patient reports decreased pain following manual techniques with increased DF as well. Patient is able to complete therex with PT cuing for proper form with no inc pain, only muscle fatigue. PT reviewed and encouraged compliance of HEP, which patient verbalized understanding of.     Rehab Potential  Fair    Clinical Impairments Affecting Rehab Potential  (-) healing time, pain; (+) husband support, acute condition, motivated    PT Frequency  2x / week    PT Duration  6 weeks    PT Treatment/Interventions  Neuromuscular re-education;Therapeutic exercise;Therapeutic activities;Manual techniques;Aquatic Therapy;Electrical Stimulation;Iontophoresis 463mml Dexamethasone;Gait training;Stair training;Balance training;Functional mobility training;Patient/family education;Scar  mobilization;Passive range of motion;Dry needling    PT Next Visit Plan  Manual techniques for pain relief, strengthening for postural carry over    PT Home Exercise Plan  great toe stretch, DF stretc, bridge    Consulted and Agree with Plan of Care  Patient       Patient will benefit from skilled therapeutic intervention in order to improve the following deficits and impairments:  Pain, Difficulty walking, Decreased strength, Decreased scar mobility, Decreased range of motion  Visit Diagnosis: Stiffness of right knee, not elsewhere classified     Problem List Patient Active Problem List   Diagnosis Date Noted  . History of total right knee replacement 03/21/2017  . Chronic diarrhea 12/07/2014  . Segmental colitis with rectal bleeding ([place]/06/2014  . Routine general medical examination at a health care facility 06/05/2014  . Overweight 06/13/2011  . ABNORMAL CHEST XRAY 09/30/2007  . Hyperlipidemia 02/17/2007  . Essential hypertension 01/08/2007  . Osteoarthritis 01/08/2007   ChShelton SilvasT, DPT ChShelton Silvas1/02/2018, 2:34 PM  [facility] [address]hone: [phone] Fax:  [phone]: Stacy BRODBECKRN: [id number] of Birth: 3/01-07-1950

## 2018-02-21 ENCOUNTER — Ambulatory Visit: Payer: Medicare Other | Admitting: Physical Therapy

## 2018-02-21 ENCOUNTER — Encounter: Payer: Self-pay | Admitting: Physical Therapy

## 2018-02-21 DIAGNOSIS — M25661 Stiffness of right knee, not elsewhere classified: Secondary | ICD-10-CM | POA: Diagnosis not present

## 2018-02-21 DIAGNOSIS — M25572 Pain in left ankle and joints of left foot: Secondary | ICD-10-CM | POA: Diagnosis not present

## 2018-02-21 DIAGNOSIS — R262 Difficulty in walking, not elsewhere classified: Secondary | ICD-10-CM

## 2018-02-21 DIAGNOSIS — M6281 Muscle weakness (generalized): Secondary | ICD-10-CM | POA: Diagnosis not present

## 2018-02-21 NOTE — Patient Instructions (Signed)
Access Code: [email or web address]  URL: [email or web address]  Date: 02/21/2018  Prepared by: Myles Gip   Exercises  Towel Scrunches - 3 sets - 60 hold - 1x daily - 7x weekly  Ankle Inversion Eversion Towel Slide - 3 sets - 60 hold - 1x daily - 7x weekly  Seated Plantar Fascia Mobilization with Small Ball - 3 sets - 60 hold - 1x daily - 7x weekly  Seated Plantar Fascia Stretch - 3 sets - 20 hold - 1x daily - 7x weekly

## 2018-02-21 NOTE — Therapy (Addendum)
[facility] [address] [place], [address] Phone: [phone]   Fax:  [phone]  Physical Therapy Treatment  Patient Details  Name: [name] MRN: [id number] Date of Birth: June 26, 1950 Referring Provider (PT): Lenon Oms MD   Encounter Date: 02/21/2018  PT End of Session - 02/21/18 1258    Visit Number  3    Number of Visits  17    Date for PT Re-Evaluation  04/12/18    PT Start Time  1300    PT Stop Time  1345    PT Time Calculation (min)  45 min    Activity Tolerance  Patient tolerated treatment well    Behavior During Therapy  [facility] for tasks assessed/performed       Past Medical History:  Diagnosis Date  . Arthritis    back, fingers with joint pain and swelling.  chronic back pain  . Chronic back pain    arthritis   . Diverticulosis   . Elevated cholesterol    takes Niacin daily and Simvastatin  . GERD (gastroesophageal reflux disease) 06/2004   non-specific gastritis on EGD 06/2004  . Headache(784.0)    occasionally  . History of colon polyps 2004, 2009   2004:adenomatous. 2009 hyperplastic.   Marland Kitchen History of hiatal hernia   . Hypertension    takes Tenoretic and Lisinopril daily  . Mucoid cyst of joint 08/2013   right index finger  . Osteopenia 01/2014   T score -1.1 FRAX 14%/0.5%. Stable from prior DEXA  . PONV (postoperative nausea and vomiting)   . Seasonal allergies   . Stroke ([place]) 1998   x 2 - mild left-sided weakness  . Urge incontinence   . Uterine prolapse     Past Surgical History:  Procedure Laterality Date  . BACK SURGERY     x 2  . BELPHAROPTOSIS REPAIR Bilateral 12/14/2017  . CATARACT EXTRACTION    . CATARACT EXTRACTION Bilateral 10/2017  . COLONOSCOPY  2004, 2009, 2014  . FINGER SURGERY    . GUM SURGERY    . GYNECOLOGIC CRYOSURGERY    . HYSTEROSCOPY W/D&C  12/07/2010   with resection of endometrial polyp  . KNEE ARTHROSCOPY Left 2008  . lip biopsy     done at MD office Fri 11/22/13  . MASS EXCISION Right 08/15/2013   Procedure: RIGHT INDEX EXCISION MASS ;  Surgeon: Tennis Must, MD;  Location: [place];  Service: Orthopedics;  Laterality: Right;  . NASAL SEPTUM SURGERY    . OOPHORECTOMY Right 2000  . SHOULDER ARTHROSCOPY WITH OPEN ROTATOR CUFF REPAIR AND DISTAL CLAVICLE ACROMINECTOMY Right 11/26/2013   Procedure: RIGHT SHOULDER ARTHROSCOPY WITH MINI OPEN ROTATOR CUFF REPAIR AND DISTAL CLAVICLE RESECTION, SUBACROMIAL DECOMPRESSION, POSSIBLE [facility] PATCH.;  Surgeon: [name], MD;  Location: [place];  Service: Orthopedics;  Laterality: Right;  . TOTAL KNEE ARTHROPLASTY Right 03/21/2017  . TOTAL KNEE ARTHROPLASTY Right 03/21/2017   Procedure: RIGHT TOTAL KNEE ARTHROPLASTY;  Surgeon: [name], MD;  Location: [place];  Service: Orthopedics;  Laterality: Right;  . TUBAL LIGATION      There were no vitals filed for this visit.  Subjective Assessment - 02/21/18 1302    Subjective  Patient states that yesterday her L foot and R knee were bothering her more yesterday and gets no relief but does rest more. Otherwise she has no significant changes or concerns at this time. Patient does note some increased muscle  tightness in the L calf complex and is amenable to TDN.    Pertinent History  Patient is a 67 year old female presenting today with L dorsum foot pain. Patient reports this pain began 3 months ago. Patient with PMH of R TKA, where she was seen here for PT, and reports she was "favoring the L side" and thinks this is where her pain began from. Patient reports over the past 2 months she has had 4 injections and is using costum orthotics, which she reports has helped with her pain 65%. Patient reports with prolonged walking she has a "little pain" if she is wearing her orthotics, and increased pain when she is not wearing her orthotics (which do not go in all of her shoes). Patient is retired, but reports she is unable to  complete hobbies of hunting, kayaking, biking, and playing with dogs. Patient reports pain "any time she is on her feet". Patient reports worst pain in the past week : 4/10 ; best : 1/10. Pt denies N/V, B&B changes, unexplained weight fluctuation, saddle paresthesia, fever, night sweats, or unrelenting night pain at this time.    Limitations  Standing;Walking;House hold activities    How long can you sit comfortably?  unlimited    How long can you stand comfortably?  w/0 orthotic 9mn    How long can you walk comfortably?  w/0 orthotic 136m    Diagnostic tests  None yet    Patient Stated Goals  Wants to go to hunting and kayaking, and walk her 5 dogs.     Currently in Pain?  Yes    Pain Score  3     Pain Location  Foot    Pain Orientation  Left    Pain Descriptors / Indicators  Sore    Pain Type  Chronic pain    Pain Onset  More than a month ago       TREATMENT  Manual Therapy: - STM with trigger point release to L gastroc; utilizing manual and following: TDN to lateral gastroc (4) 0.60 needles placed with local twitch response noted. Utilized to decrease increased muscular spasms and trigger points with the patient positioned in prone. Patient was educated on risks and benefits of therapy and verbally consents to PT. Performed by ChShelton SilvasPT, DPT - STM with trigger point release to medial and transverse arch - Manual DF stretch x2 30sec hold - Manual great toe stretch x2 30sec hold - AP mob with DF grade III-IV 30sec bouts; 8 bouts for inc DF rom - Proximal and distal tibfib mobilizations AP, grade III-IV; 30 sec bouts, 4 bouts for inc DF rom  Therapeutic Exercise: Bridges 3x10 VCs for 3 sec glute squeeze at top Seated Towel Scrunches 3 x 1 min Seated Towel inversion/eversion 3 x 17m84mSeated plantar fascia mobilization with small ball 3 x 1 min Seated plantar fascia stretch 3 x 20 seconds   Patient educated on activity pacing and modification to modulate pain and reduce  muscle cramping, as well as proper mechanics during exercise. Patient provided with additional HEP program handout and demonstration and was able to articulate understanding and return demonstration. When asked how confident she was in her ability to perform the new exercises 1x/daily on a 0-10 scale, she rated her confidence as a 10/10.   New HEP: Access Code: [email or web address]: [email or web address]  Date: 02/21/2018  Prepared by: KatMyles GipExercises  Towel Scrunches - 3 sets - 60 hold - 1x  daily - 7x weekly  Ankle Inversion Eversion Towel Slide - 3 sets - 60 hold - 1x daily - 7x weekly  Seated Plantar Fascia Mobilization with Small Ball - 3 sets - 60 hold - 1x daily - 7x weekly  Seated Plantar Fascia Stretch - 3 sets - 20 hold - 1x daily - 7x weekly       PT Short Term Goals - 02/15/18 1648      PT SHORT TERM GOAL #1   Title  Pt will be independent with HEP in order to improve strength and balance in order to decrease fall risk and improve function at home and work    Time  4    Period  Weeks    Status  New        PT Long Term Goals - 02/15/18 1648      PT LONG TERM GOAL #1   Title  Pt will increase 10MWT by at least 0.13 m/s in order to demonstrate clinically significant improvement in community ambulation    Baseline  02/15/18: 0.1ms    Time  8    Period  Weeks    Status  New      PT LONG TERM GOAL #2   Title  Pt will decrease 5TSTS by at least 3 seconds in order to demonstrate clinically significant improvement in LE strength    Baseline  02/15/18: 8sec    Time  8    Period  Weeks    Status  New      PT LONG TERM GOAL #3   Title  Pt will decrease worst pain as reported on NPRS by at least 3 points in order to demonstrate clinically significant reduction in pain.    Baseline  02/15/18 4/10    Time  8    Period  Weeks    Status  New      PT LONG TERM GOAL #4   Title  Patient will increase FOTO score to 56 to demonstrate predicted increase in  functional mobility to complete ADLs    Baseline  02/15/18 45    Time  8    Period  Weeks    Status  New            Plan - 02/21/18 1410    Clinical Impression Statement  Patient presents to clinic with mild pain (3/10) in the L plantar fascia and L calf and is amenable to therapy. Patient demonstrates deficits in mobility, strength, and ROM as evidenced by decreased tolerance to weight-bearing activities. Patient will benefit from continued skilled therapeutic intervention to address deficits in pain, strength, mobility, and ROM in order to return to PLOF and improve overall QOL.    Rehab Potential  Fair    Clinical Impairments Affecting Rehab Potential  (-) healing time, pain; (+) husband support, acute condition, motivated    PT Frequency  2x / week    PT Duration  6 weeks    PT Treatment/Interventions  Neuromuscular re-education;Therapeutic exercise;Therapeutic activities;Manual techniques;Aquatic Therapy;Electrical Stimulation;Iontophoresis 473mml Dexamethasone;Gait training;Stair training;Balance training;Functional mobility training;Patient/family education;Scar mobilization;Passive range of motion;Dry needling    PT Next Visit Plan  Manual techniques for pain relief, strengthening for postural carry over    PT Home Exercise Plan  great toe stretch, DF stretc, bridge    Consulted and Agree with Plan of Care  Patient       Patient will benefit from skilled therapeutic intervention in order to improve the following deficits and impairments:  Pain, Difficulty walking, Decreased strength, Decreased scar mobility, Decreased range of motion  Visit Diagnosis: Pain in left ankle and joints of left foot  Muscle weakness (generalized)  Difficulty in walking, not elsewhere classified     Problem List Patient Active Problem List   Diagnosis Date Noted  . History of total right knee replacement 03/21/2017  . Chronic diarrhea 12/07/2014  . Segmental colitis with rectal bleeding  ([place]) 09/12/2014  . Routine general medical examination at a health care facility 06/05/2014  . Overweight 06/13/2011  . ABNORMAL CHEST XRAY 09/30/2007  . Hyperlipidemia 02/17/2007  . Essential hypertension 01/08/2007  . Osteoarthritis 01/08/2007   Myles Gip PT, DPT [phone] 02/21/2018, 2:11 PM  [facility] [address], [place], [address] Phone: [phone]   Fax:  [phone]  Name: PALMA BUSTER MRN: [id number] Date of Birth: November 10, 1950

## 2018-03-01 ENCOUNTER — Ambulatory Visit: Payer: Medicare Other | Admitting: Physical Therapy

## 2018-03-05 DIAGNOSIS — M722 Plantar fascial fibromatosis: Secondary | ICD-10-CM | POA: Diagnosis not present

## 2018-03-05 DIAGNOSIS — G5752 Tarsal tunnel syndrome, left lower limb: Secondary | ICD-10-CM | POA: Diagnosis not present

## 2018-03-07 ENCOUNTER — Ambulatory Visit: Payer: Medicare Other | Admitting: Physical Therapy

## 2018-03-07 ENCOUNTER — Encounter: Payer: Self-pay | Admitting: Physical Therapy

## 2018-03-07 DIAGNOSIS — M25661 Stiffness of right knee, not elsewhere classified: Secondary | ICD-10-CM | POA: Diagnosis not present

## 2018-03-07 DIAGNOSIS — M6281 Muscle weakness (generalized): Secondary | ICD-10-CM | POA: Diagnosis not present

## 2018-03-07 DIAGNOSIS — R262 Difficulty in walking, not elsewhere classified: Secondary | ICD-10-CM | POA: Diagnosis not present

## 2018-03-07 DIAGNOSIS — M25572 Pain in left ankle and joints of left foot: Secondary | ICD-10-CM | POA: Diagnosis not present

## 2018-03-07 NOTE — Therapy (Signed)
[facility] [address] [place], [address] Phone: [phone]   Fax:  [phone]  Physical Therapy Treatment  Patient Details  Name: [name] MRN: [id number] Date of Birth: [birth year] Referring Provider (PT): Lenon Oms MD   Encounter Date: 03/07/2018  PT End of Session - 03/07/18 0954    Visit Number  4    Number of Visits  17    Date for PT Re-Evaluation  04/12/18    PT Start Time  0945    PT Stop Time  1030    PT Time Calculation (min)  45 min    Activity Tolerance  Patient tolerated treatment well    Behavior During Therapy  [facility] for tasks assessed/performed       Past Medical History:  Diagnosis Date  . Arthritis    back, fingers with joint pain and swelling.  chronic back pain  . Chronic back pain    arthritis   . Diverticulosis   . Elevated cholesterol    takes Niacin daily and Simvastatin  . GERD (gastroesophageal reflux disease) 06/2004   non-specific gastritis on EGD 06/2004  . Headache(784.0)    occasionally  . History of colon polyps 2004, 2009   2004:adenomatous. 2009 hyperplastic.   Marland Kitchen History of hiatal hernia   . Hypertension    takes Tenoretic and Lisinopril daily  . Mucoid cyst of joint 08/2013   right index finger  . Osteopenia 01/2014   T score -1.1 FRAX 14%/0.5%. Stable from prior DEXA  . PONV (postoperative nausea and vomiting)   . Seasonal allergies   . Stroke ([place]) 1998   x 2 - mild left-sided weakness  . Urge incontinence   . Uterine prolapse     Past Surgical History:  Procedure Laterality Date  . BACK SURGERY     x 2  . BELPHAROPTOSIS REPAIR Bilateral 12/14/2017  . CATARACT EXTRACTION    . CATARACT EXTRACTION Bilateral 10/2017  . COLONOSCOPY  2004, 2009, 2014  . FINGER SURGERY    . GUM SURGERY    . GYNECOLOGIC CRYOSURGERY    . HYSTEROSCOPY W/D&C  12/07/2010   with resection of endometrial polyp  . KNEE ARTHROSCOPY Left 2008  . lip biopsy     done at MD office Fri 11/22/13  . MASS EXCISION Right 08/15/2013   Procedure: RIGHT INDEX EXCISION MASS ;  Surgeon: Tennis Must, MD;  Location: [place];  Service: Orthopedics;  Laterality: Right;  . NASAL SEPTUM SURGERY    . OOPHORECTOMY Right 2000  . SHOULDER ARTHROSCOPY WITH OPEN ROTATOR CUFF REPAIR AND DISTAL CLAVICLE ACROMINECTOMY Right 11/26/2013   Procedure: RIGHT SHOULDER ARTHROSCOPY WITH MINI OPEN ROTATOR CUFF REPAIR AND DISTAL CLAVICLE RESECTION, SUBACROMIAL DECOMPRESSION, POSSIBLE [facility] PATCH.;  Surgeon: [name], MD;  Location: [place];  Service: Orthopedics;  Laterality: Right;  . TOTAL KNEE ARTHROPLASTY Right 03/21/2017  . TOTAL KNEE ARTHROPLASTY Right 03/21/2017   Procedure: RIGHT TOTAL KNEE ARTHROPLASTY;  Surgeon: [name], MD;  Location: [place];  Service: Orthopedics;  Laterality: Right;  . TUBAL LIGATION      There were no vitals filed for this visit.  Subjective Assessment - 03/07/18 0950    Subjective  Patient reports good pain relief following last session. Patient reports she saw her PCP, and had anonther injection in her foot Monday, which she also feels like is helping.     Pertinent History  Patient is a 67  year old female presenting today with L dorsum foot pain. Patient reports this pain began 3 months ago. Patient with PMH of R TKA, where she was seen here for PT, and reports she was "favoring the L side" and thinks this is where her pain began from. Patient reports over the past 2 months she has had 4 injections and is using costum orthotics, which she reports has helped with her pain 65%. Patient reports with prolonged walking she has a "little pain" if she is wearing her orthotics, and increased pain when she is not wearing her orthotics (which do not go in all of her shoes). Patient is retired, but reports she is unable to complete hobbies of hunting, kayaking, biking, and playing with dogs. Patient reports pain "any time she is on  her feet". Patient reports worst pain in the past week : 4/10 ; best : 1/10. Pt denies N/V, B&B changes, unexplained weight fluctuation, saddle paresthesia, fever, night sweats, or unrelenting night pain at this time.    Limitations  Standing;Walking;House hold activities    How long can you sit comfortably?  unlimited    How long can you stand comfortably?  w/0 orthotic 8mn    How long can you walk comfortably?  w/0 orthotic 151m    Diagnostic tests  None yet    Patient Stated Goals  Wants to go to hunting and kayaking, and walk her 5 dogs.          Therapeutic Exercise: - Nustep L1 45m50m- Bridges x10; bridge with red tband 2x 10 with glute set prior and no report of "hamstring cramping" - SLS trials on ground x2 each LE; on airex pad x2 each LE (best LLE time 17sec; best RLE: 20sec - Attempted lateral step down from 4in step, unable to complete d/t knee pain -Squat with RLE on balance stone for inc LLE wt bearing 3x 8 - Side stepping with RTB2x 2x [id number] cuing for foot clearance and eccentric control - Standing heel raises 2x 10 with min cuing for eccentric control - Soleus and gastroc stretch on step x2 each 30sec hold with demo prior                      PT Education - 03/07/18 0953    Education provided  Yes    Education Details  Exercise form    Person(s) Educated  Patient    Methods  Explanation;Demonstration;Verbal cues    Comprehension  Verbalized understanding;Returned demonstration;Verbal cues required       PT Short Term Goals - 02/15/18 1648      PT SHORT TERM GOAL #1   Title  Pt will be independent with HEP in order to improve strength and balance in order to decrease fall risk and improve function at home and work    Time  4    Period  Weeks    Status  New        PT Long Term Goals - 02/15/18 1648      PT LONG TERM GOAL #1   Title  Pt will increase 10MWT by at least 0.13 m/s in order to demonstrate clinically significant  improvement in community ambulation    Baseline  02/15/18: 0.34m/36m Time  8    Period  Weeks    Status  New      PT LONG TERM GOAL #2   Title  Pt will decrease 5TSTS by at least 3 seconds  in order to demonstrate clinically significant improvement in LE strength    Baseline  02/15/18: 8sec    Time  8    Period  Weeks    Status  New      PT LONG TERM GOAL #3   Title  Pt will decrease worst pain as reported on NPRS by at least 3 points in order to demonstrate clinically significant reduction in pain.    Baseline  02/15/18 4/10    Time  8    Period  Weeks    Status  New      PT LONG TERM GOAL #4   Title  Patient will increase FOTO score to 56 to demonstrate predicted increase in functional mobility to complete ADLs    Baseline  02/15/18 45    Time  8    Period  Weeks    Status  New            Plan - 03/07/18 1030    Clinical Impression Statement  D/t decreased pain noted from patient today, PT continued therex progression. Patient is able to complete all therex with min cuing for proper form. PT will continue therex progression as able.     Rehab Potential  Fair    PT Frequency  2x / week    PT Duration  6 weeks    PT Treatment/Interventions  Neuromuscular re-education;Therapeutic exercise;Therapeutic activities;Manual techniques;Aquatic Therapy;Electrical Stimulation;Iontophoresis 34m/ml Dexamethasone;Gait training;Stair training;Balance training;Functional mobility training;Patient/family education;Scar mobilization;Passive range of motion;Dry needling    PT Next Visit Plan  Manual techniques for pain relief, strengthening for postural carry over    PT Home Exercise Plan  great toe stretch, DF stretc, bridge    Consulted and Agree with Plan of Care  Patient       Patient will benefit from skilled therapeutic intervention in order to improve the following deficits and impairments:  Pain, Difficulty walking, Decreased strength, Decreased scar mobility, Decreased range of  motion  Visit Diagnosis: Muscle weakness (generalized)     Problem List Patient Active Problem List   Diagnosis Date Noted  . History of total right knee replacement 03/21/2017  . Chronic diarrhea 12/07/2014  . Segmental colitis with rectal bleeding [place] 09/12/2014  . Routine general medical examination at a health care facility 06/05/2014  . Overweight 06/13/2011  . ABNORMAL CHEST XRAY 09/30/2007  . Hyperlipidemia 02/17/2007  . Essential hypertension 01/08/2007  . Osteoarthritis 01/08/2007   CShelton SilvasPT, DPT CShelton Silvas11/27/2019, 11:04 AM  [facility] [address] [place] [address]Phone: [phone]  Fax:  [phone] Name: NGRACELAND WACHTERMRN: [id number] of Birth: 3Oct 21, 1952

## 2018-03-13 ENCOUNTER — Encounter: Payer: Self-pay | Admitting: Physical Therapy

## 2018-03-13 ENCOUNTER — Ambulatory Visit: Payer: Medicare Other | Attending: Podiatry | Admitting: Physical Therapy

## 2018-03-13 DIAGNOSIS — M25572 Pain in left ankle and joints of left foot: Secondary | ICD-10-CM | POA: Diagnosis not present

## 2018-03-13 DIAGNOSIS — R262 Difficulty in walking, not elsewhere classified: Secondary | ICD-10-CM | POA: Insufficient documentation

## 2018-03-13 DIAGNOSIS — K130 Diseases of lips: Secondary | ICD-10-CM | POA: Diagnosis not present

## 2018-03-13 DIAGNOSIS — M6281 Muscle weakness (generalized): Secondary | ICD-10-CM | POA: Diagnosis not present

## 2018-03-13 NOTE — Therapy (Signed)
[facility] [address] [place], [address] Phone: [phone]   Fax:  [phone]  Physical Therapy Treatment  Patient Details  Name: [name] MRN: [id number] Date of Birth: Jan 13, 1951 Referring Provider (PT): Lenon Oms MD   Encounter Date: 03/13/2018  PT End of Session - 03/13/18 1321    Visit Number  5    Number of Visits  17    Date for PT Re-Evaluation  04/12/18    PT Start Time  0100    PT Stop Time  0145    PT Time Calculation (min)  45 min    Activity Tolerance  Patient tolerated treatment well;Patient limited by pain    Behavior During Therapy  [facility] for tasks assessed/performed       Past Medical History:  Diagnosis Date  . Arthritis    back, fingers with joint pain and swelling.  chronic back pain  . Chronic back pain    arthritis   . Diverticulosis   . Elevated cholesterol    takes Niacin daily and Simvastatin  . GERD (gastroesophageal reflux disease) 06/2004   non-specific gastritis on EGD 06/2004  . Headache(784.0)    occasionally  . History of colon polyps 2004, 2009   2004:adenomatous. 2009 hyperplastic.   Marland Kitchen History of hiatal hernia   . Hypertension    takes Tenoretic and Lisinopril daily  . Mucoid cyst of joint 08/2013   right index finger  . Osteopenia 01/2014   T score -1.1 FRAX 14%/0.5%. Stable from prior DEXA  . PONV (postoperative nausea and vomiting)   . Seasonal allergies   . Stroke ([place]) 1998   x 2 - mild left-sided weakness  . Urge incontinence   . Uterine prolapse     Past Surgical History:  Procedure Laterality Date  . BACK SURGERY     x 2  . BELPHAROPTOSIS REPAIR Bilateral 12/14/2017  . CATARACT EXTRACTION    . CATARACT EXTRACTION Bilateral 10/2017  . COLONOSCOPY  2004, 2009, 2014  . FINGER SURGERY    . GUM SURGERY    . GYNECOLOGIC CRYOSURGERY    . HYSTEROSCOPY W/D&C  12/07/2010   with resection of endometrial polyp  . KNEE ARTHROSCOPY Left 2008   . lip biopsy     done at MD office Fri 11/22/13  . MASS EXCISION Right 08/15/2013   Procedure: RIGHT INDEX EXCISION MASS ;  Surgeon: Tennis Must, MD;  Location: [place];  Service: Orthopedics;  Laterality: Right;  . NASAL SEPTUM SURGERY    . OOPHORECTOMY Right 2000  . SHOULDER ARTHROSCOPY WITH OPEN ROTATOR CUFF REPAIR AND DISTAL CLAVICLE ACROMINECTOMY Right 11/26/2013   Procedure: RIGHT SHOULDER ARTHROSCOPY WITH MINI OPEN ROTATOR CUFF REPAIR AND DISTAL CLAVICLE RESECTION, SUBACROMIAL DECOMPRESSION, POSSIBLE [facility] PATCH.;  Surgeon: [name], MD;  Location: [place];  Service: Orthopedics;  Laterality: Right;  . TOTAL KNEE ARTHROPLASTY Right 03/21/2017  . TOTAL KNEE ARTHROPLASTY Right 03/21/2017   Procedure: RIGHT TOTAL KNEE ARTHROPLASTY;  Surgeon: [name], MD;  Location: [place];  Service: Orthopedics;  Laterality: Right;  . TUBAL LIGATION      There were no vitals filed for this visit.  Subjective Assessment - 03/13/18 1309    Subjective  Patient reports she had some "on and off pain" over the weekend. Patient reports most of her pain came on after she "was up for a while cooking". Patient reports her arch feels "tight" today and  reports 1-2/10 pain here today.     Pertinent History  Patient is a 67 year old female presenting today with L dorsum foot pain. Patient reports this pain began 3 months ago. Patient with PMH of R TKA, where she was seen here for PT, and reports she was "favoring the L side" and thinks this is where her pain began from. Patient reports over the past 2 months she has had 4 injections and is using costum orthotics, which she reports has helped with her pain 65%. Patient reports with prolonged walking she has a "little pain" if she is wearing her orthotics, and increased pain when she is not wearing her orthotics (which do not go in all of her shoes). Patient is retired, but reports she is unable to complete hobbies of hunting, kayaking,  biking, and playing with dogs. Patient reports pain "any time she is on her feet". Patient reports worst pain in the past week : 4/10 ; best : 1/10. Pt denies N/V, B&B changes, unexplained weight fluctuation, saddle paresthesia, fever, night sweats, or unrelenting night pain at this time.    Limitations  Standing;Walking;House hold activities    How long can you sit comfortably?  unlimited    How long can you stand comfortably?  w/0 orthotic 63mn    How long can you walk comfortably?  w/0 orthotic 127m    Diagnostic tests  None yet    Patient Stated Goals  Wants to go to hunting and kayaking, and walk her 5 dogs.     Pain Onset  More than a month ago       Therapeutic Exercise: - Attempted bridge with bosu multiple times with glute set prior and bilat hip ER to prevent "hamstring cramp", without success; discontinued - MATRIX hip ext 3x 10 bilat 40# with min cuing for proper form/posture  - Peroneal self nerve glide in supine (added to HEP) with patient able to demonstrate accuracy   Manual - STM withtrigger point releaseto L gastroc; utilizing manual and following: TDN to lateral gastroc (3) 0.60 needles placed with local twitch response noted. Utilized to decrease increased muscular spasms and trigger points with the patient positioned in prone. Patient was educated on risks and benefits of therapy and verbally consents to PT.  - STM withtrigger point releaseto medial and transverse arch - Manual DF stretch x4 30sec hold (2 prior and 2 post bridge attempt) - Manual great toe stretch x2 30sec hold - AP mob with DF grade III-IV 30sec bouts; 8 bouts for inc DF rom - Following bridge attempt: bilat STM with trigger point release to bilat hamstrings and L gastroc, attempted hamstring stretch with patient having full ROM without issue- discontinued and replaced with manual peroneal glide and educated patient on purpose and anatomy                        PT Education -  03/13/18 1400    Education provided  Yes    Education Details  nerve glides; exercise form    Person(s) Educated  Patient    Methods  Explanation;Demonstration;Verbal cues    Comprehension  Verbalized understanding;Returned demonstration;Verbal cues required       PT Short Term Goals - 02/15/18 1648      PT SHORT TERM GOAL #1   Title  Pt will be independent with HEP in order to improve strength and balance in order to decrease fall risk and improve function at home and work  Time  4    Period  Weeks    Status  New        PT Long Term Goals - 02/15/18 1648      PT LONG TERM GOAL #1   Title  Pt will increase 10MWT by at least 0.13 m/s in order to demonstrate clinically significant improvement in community ambulation    Baseline  02/15/18: 0.100ms    Time  8    Period  Weeks    Status  New      PT LONG TERM GOAL #2   Title  Pt will decrease 5TSTS by at least 3 seconds in order to demonstrate clinically significant improvement in LE strength    Baseline  02/15/18: 8sec    Time  8    Period  Weeks    Status  New      PT LONG TERM GOAL #3   Title  Pt will decrease worst pain as reported on NPRS by at least 3 points in order to demonstrate clinically significant reduction in pain.    Baseline  02/15/18 4/10    Time  8    Period  Weeks    Status  New      PT LONG TERM GOAL #4   Title  Patient will increase FOTO score to 56 to demonstrate predicted increase in functional mobility to complete ADLs    Baseline  02/15/18 45    Time  8    Period  Weeks    Status  New            Plan - 03/13/18 1400    Clinical Impression Statement  Patient limited in LE strengthening porgression d/t increased muscle spasm/tension throughout session. PT educated patient on possible neural tension (patient reports some "electrical sensastion, and occasional LBP as well), and relief for this with self nerve glides > stretching. Patient verbalized understanding of all provided education.  Patient will continue to benefit from skilled PT to continue to attempt LE progression and for pain management.     Rehab Potential  Fair    Clinical Impairments Affecting Rehab Potential  (-) healing time, pain; (+) husband support, acute condition, motivated    PT Frequency  2x / week    PT Duration  6 weeks    PT Treatment/Interventions  Neuromuscular re-education;Therapeutic exercise;Therapeutic activities;Manual techniques;Aquatic Therapy;Electrical Stimulation;Iontophoresis 459mml Dexamethasone;Gait training;Stair training;Balance training;Functional mobility training;Patient/family education;Scar mobilization;Passive range of motion;Dry needling    PT Next Visit Plan  Manual techniques for pain relief, strengthening for postural carry over    PT Home Exercise Plan  great toe stretch, DF stretc, bridge    Consulted and Agree with Plan of Care  Patient       Patient will benefit from skilled therapeutic intervention in order to improve the following deficits and impairments:  Pain, Difficulty walking, Decreased strength, Decreased scar mobility, Decreased range of motion  Visit Diagnosis: Muscle weakness (generalized)     Problem List Patient Active Problem List   Diagnosis Date Noted  . History of total right knee replacement 03/21/2017  . Chronic diarrhea 12/07/2014  . Segmental colitis with rectal bleeding ([facility]06/06/2014  . Routine general medical examination at a health care facility 06/05/2014  . Overweight 06/13/2011  . ABNORMAL CHEST XRAY 09/30/2007  . Hyperlipidemia 02/17/2007  . Essential hypertension 01/08/2007  . Osteoarthritis 01/08/2007   ChShelton SilvasT, DPT ChShelton Silvas2/06/2017, 2:03 PM  [facility]  [address] [place], [address] Phone: [phone]   Fax:  [phone]  Name: [name] MRN: [id number] Date of Birth: 1951/01/07

## 2018-03-15 ENCOUNTER — Encounter: Payer: Self-pay | Admitting: Physical Therapy

## 2018-03-15 ENCOUNTER — Ambulatory Visit: Payer: Medicare Other | Admitting: Physical Therapy

## 2018-03-15 DIAGNOSIS — M6281 Muscle weakness (generalized): Secondary | ICD-10-CM | POA: Diagnosis not present

## 2018-03-15 DIAGNOSIS — R262 Difficulty in walking, not elsewhere classified: Secondary | ICD-10-CM | POA: Diagnosis not present

## 2018-03-15 DIAGNOSIS — M25572 Pain in left ankle and joints of left foot: Secondary | ICD-10-CM | POA: Diagnosis not present

## 2018-03-15 NOTE — Therapy (Signed)
[facility] [address], [place], [address] Phone: [phone]   Fax:  [phone]  Physical Therapy Treatment  Patient Details  Name: [name] MRN: [id number] Date of Birth: Jun 20, 1950 Referring Provider (PT): Lenon Oms MD   Encounter Date: 03/15/2018  PT End of Session - 03/15/18 1441    Visit Number  6    Number of Visits  17    Date for PT Re-Evaluation  04/12/18    PT Start Time  0145    PT Stop Time  0230    PT Time Calculation (min)  45 min    Activity Tolerance  Patient tolerated treatment well;Treatment limited secondary to medical complications (Comment)    Behavior During Therapy  [facility] for tasks assessed/performed       Past Medical History:  Diagnosis Date  . Arthritis    back, fingers with joint pain and swelling.  chronic back pain  . Chronic back pain    arthritis   . Diverticulosis   . Elevated cholesterol    takes Niacin daily and Simvastatin  . GERD (gastroesophageal reflux disease) 06/2004   non-specific gastritis on EGD 06/2004  . Headache(784.0)    occasionally  . History of colon polyps 2004, 2009   2004:adenomatous. 2009 hyperplastic.   Marland Kitchen History of hiatal hernia   . Hypertension    takes Tenoretic and Lisinopril daily  . Mucoid cyst of joint 08/2013   right index finger  . Osteopenia 01/2014   T score -1.1 FRAX 14%/0.5%. Stable from prior DEXA  . PONV (postoperative nausea and vomiting)   . Seasonal allergies   . Stroke ([place]) 1998   x 2 - mild left-sided weakness  . Urge incontinence   . Uterine prolapse     Past Surgical History:  Procedure Laterality Date  . BACK SURGERY     x 2  . BELPHAROPTOSIS REPAIR Bilateral 12/14/2017  . CATARACT EXTRACTION    . CATARACT EXTRACTION Bilateral 10/2017  . COLONOSCOPY  2004, 2009, 2014  . FINGER SURGERY    . GUM SURGERY    . GYNECOLOGIC CRYOSURGERY    . HYSTEROSCOPY W/D&C  12/07/2010   with resection of  endometrial polyp  . KNEE ARTHROSCOPY Left 2008  . lip biopsy     done at MD office Fri 11/22/13  . MASS EXCISION Right 08/15/2013   Procedure: RIGHT INDEX EXCISION MASS ;  Surgeon: Tennis Must, MD;  Location: [place];  Service: Orthopedics;  Laterality: Right;  . NASAL SEPTUM SURGERY    . OOPHORECTOMY Right 2000  . SHOULDER ARTHROSCOPY WITH OPEN ROTATOR CUFF REPAIR AND DISTAL CLAVICLE ACROMINECTOMY Right 11/26/2013   Procedure: RIGHT SHOULDER ARTHROSCOPY WITH MINI OPEN ROTATOR CUFF REPAIR AND DISTAL CLAVICLE RESECTION, SUBACROMIAL DECOMPRESSION, POSSIBLE [facility] PATCH.;  Surgeon: [name], MD;  Location: [place];  Service: Orthopedics;  Laterality: Right;  . TOTAL KNEE ARTHROPLASTY Right 03/21/2017  . TOTAL KNEE ARTHROPLASTY Right 03/21/2017   Procedure: RIGHT TOTAL KNEE ARTHROPLASTY;  Surgeon: [name], MD;  Location: [place];  Service: Orthopedics;  Laterality: Right;  . TUBAL LIGATION      There were no vitals filed for this visit.  Subjective Assessment - 03/15/18 1307    Subjective  Patient reports she had increased pain Wednesday after not wearing her "DF stretcher" Tuesday night. Patient reports she is having pain in her "arch" today. Patient reports she has been having 2/10 pain  today.     Pertinent History  Patient is a 67 year old female presenting today with L dorsum foot pain. Patient reports this pain began 3 months ago. Patient with PMH of R TKA, where she was seen here for PT, and reports she was "favoring the L side" and thinks this is where her pain began from. Patient reports over the past 2 months she has had 4 injections and is using costum orthotics, which she reports has helped with her pain 65%. Patient reports with prolonged walking she has a "little pain" if she is wearing her orthotics, and increased pain when she is not wearing her orthotics (which do not go in all of her shoes). Patient is retired, but reports she is unable to complete  hobbies of hunting, kayaking, biking, and playing with dogs. Patient reports pain "any time she is on her feet". Patient reports worst pain in the past week : 4/10 ; best : 1/10. Pt denies N/V, B&B changes, unexplained weight fluctuation, saddle paresthesia, fever, night sweats, or unrelenting night pain at this time.    Limitations  Standing;Walking;House hold activities    How long can you sit comfortably?  unlimited    How long can you stand comfortably?  w/0 orthotic 8100mn    How long can you walk comfortably?  w/0 orthotic 146m    Diagnostic tests  None yet    Patient Stated Goals  Wants to go to hunting and kayaking, and walk her 5 dogs.     Pain Onset  More than a month ago       Therapeutic Exercise: - Lateral step down from 100m73m2x 8/6 with patient unable to progress d/t L knee pain, good form with glute activation following demo and TC at L gSharon Hillr activation - Pron  hip ext with glute contraction prior 3x 10 each LE (alt) - Following, attempted to walk with patient into gym for other exercise and patient reports lightheadedness when sitting up. PT took patient's BP: 140/82 and gave patient water. After 4 mins patient reports she is no longer lightheaded and is able to stand up and walk without symptoms. Patient reports she has not taken her BP medication yet, and that she feels fine now to go home. PT offered medical treatment to patient which she denied needing, and advised patient to seek medical attention should symptoms continue; pt verbalized understanding   Manual - STM withtrigger point releaseto L gastroc; utilizing manual and following: TDN to lateral gastroc (2) 0.60 needles placed with local twitch response noted. Utilizedto decrease increased muscular spasms and trigger points with the patient positioned in prone. Patient was educated on risks and benefits of therapy and verbally consents to PT. - STM withtrigger point releasetomedial and transverse arch with  "ease up" putty - Manual DF stretch x2 30sec hold  - Manual great toe stretch x2 30sec hold - AP mob with DF grade III-IV 30sec bouts; 8 bouts for inc DF rom                         PT Education - 03/15/18 1337    Education provided  Yes    Education Details  Exercise form    Person(s) Educated  Patient    Methods  Explanation;Tactile cues;Verbal cues;Demonstration    Comprehension  Verbalized understanding;Tactile cues required;Verbal cues required;Returned demonstration       PT Short Term Goals - 02/15/18 1648  PT SHORT TERM GOAL #1   Title  Pt will be independent with HEP in order to improve strength and balance in order to decrease fall risk and improve function at home and work    Time  4    Period  Weeks    Status  New        PT Long Term Goals - 02/15/18 1648      PT LONG TERM GOAL #1   Title  Pt will increase 10MWT by at least 0.13 m/s in order to demonstrate clinically significant improvement in community ambulation    Baseline  02/15/18: 0.40ms    Time  8    Period  Weeks    Status  New      PT LONG TERM GOAL #2   Title  Pt will decrease 5TSTS by at least 3 seconds in order to demonstrate clinically significant improvement in LE strength    Baseline  02/15/18: 8sec    Time  8    Period  Weeks    Status  New      PT LONG TERM GOAL #3   Title  Pt will decrease worst pain as reported on NPRS by at least 3 points in order to demonstrate clinically significant reduction in pain.    Baseline  02/15/18 4/10    Time  8    Period  Weeks    Status  New      PT LONG TERM GOAL #4   Title  Patient will increase FOTO score to 56 to demonstrate predicted increase in functional mobility to complete ADLs    Baseline  02/15/18 45    Time  8    Period  Weeks    Status  New            Plan - 03/15/18 1444    Clinical Impression Statement  Patient continues to report good pain relief from manual techniques. PT progressed therex as able,  limited to secondary medical condition. Patient's sytolic BP was elevated with lightheadness that subsided after a few minutes. PT advised patient to seek medical attention should sx continue at home.     Rehab Potential  Fair    Clinical Impairments Affecting Rehab Potential  (-) healing time, pain; (+) husband support, acute condition, motivated    PT Frequency  2x / week    PT Duration  6 weeks    PT Treatment/Interventions  Neuromuscular re-education;Therapeutic exercise;Therapeutic activities;Manual techniques;Aquatic Therapy;Electrical Stimulation;Iontophoresis 465mml Dexamethasone;Gait training;Stair training;Balance training;Functional mobility training;Patient/family education;Scar mobilization;Passive range of motion;Dry needling    PT Next Visit Plan  Manual techniques for pain relief, strengthening for postural carry over    PT Home Exercise Plan  great toe stretch, DF stretc, bridge    Consulted and Agree with Plan of Care  Patient       Patient will benefit from skilled therapeutic intervention in order to improve the following deficits and impairments:  Pain, Difficulty walking, Decreased strength, Decreased scar mobility, Decreased range of motion  Visit Diagnosis: Muscle weakness (generalized)     Problem List Patient Active Problem List   Diagnosis Date Noted  . History of total right knee replacement 03/21/2017  . Chronic diarrhea 12/07/2014  . Segmental colitis with rectal bleeding ([facility]/06/2014  . Routine general medical examination at a health care facility 06/05/2014  . Overweight 06/13/2011  . ABNORMAL CHEST XRAY 09/30/2007  . Hyperlipidemia 02/17/2007  . Essential hypertension 01/08/2007  . Osteoarthritis 01/08/2007   Chelsea  [name] Heck PT, DPT Shelton Silvas 03/15/2018, 4:08 PM  [facility] [address] [place], [address] Phone: [phone]   Fax:  [phone]  Name: [name] MRN: [id number] Date of Birth: 1951/03/21

## 2018-03-19 ENCOUNTER — Ambulatory Visit: Payer: Medicare Other | Admitting: Physical Therapy

## 2018-03-19 ENCOUNTER — Encounter: Payer: Self-pay | Admitting: Physical Therapy

## 2018-03-19 DIAGNOSIS — M25572 Pain in left ankle and joints of left foot: Secondary | ICD-10-CM | POA: Diagnosis not present

## 2018-03-19 DIAGNOSIS — M6281 Muscle weakness (generalized): Secondary | ICD-10-CM

## 2018-03-19 DIAGNOSIS — R262 Difficulty in walking, not elsewhere classified: Secondary | ICD-10-CM | POA: Diagnosis not present

## 2018-03-19 NOTE — Therapy (Signed)
[facility] [address], [place], [address] Phone: [phone]   Fax:  [phone]  Physical Therapy Treatment  Patient Details  Name: [name] MRN: [id number] Date of Birth: 1951/03/04 Referring Provider (PT): Lenon Oms MD   Encounter Date: 03/19/2018  PT End of Session - 03/19/18 1058    Visit Number  7    Number of Visits  17    Date for PT Re-Evaluation  04/12/18    PT Start Time  1030    PT Stop Time  1115    PT Time Calculation (min)  45 min    Activity Tolerance  Patient tolerated treatment well;Treatment limited secondary to medical complications (Comment)    Behavior During Therapy  [facility] for tasks assessed/performed       Past Medical History:  Diagnosis Date  . Arthritis    back, fingers with joint pain and swelling.  chronic back pain  . Chronic back pain    arthritis   . Diverticulosis   . Elevated cholesterol    takes Niacin daily and Simvastatin  . GERD (gastroesophageal reflux disease) 06/2004   non-specific gastritis on EGD 06/2004  . Headache(784.0)    occasionally  . History of colon polyps 2004, 2009   2004:adenomatous. 2009 hyperplastic.   Marland Kitchen History of hiatal hernia   . Hypertension    takes Tenoretic and Lisinopril daily  . Mucoid cyst of joint 08/2013   right index finger  . Osteopenia 01/2014   T score -1.1 FRAX 14%/0.5%. Stable from prior DEXA  . PONV (postoperative nausea and vomiting)   . Seasonal allergies   . Stroke ([place]) 1998   x 2 - mild left-sided weakness  . Urge incontinence   . Uterine prolapse     Past Surgical History:  Procedure Laterality Date  . BACK SURGERY     x 2  . BELPHAROPTOSIS REPAIR Bilateral 12/14/2017  . CATARACT EXTRACTION    . CATARACT EXTRACTION Bilateral 10/2017  . COLONOSCOPY  2004, 2009, 2014  . FINGER SURGERY    . GUM SURGERY    . GYNECOLOGIC CRYOSURGERY    . HYSTEROSCOPY W/D&C  12/07/2010   with resection of  endometrial polyp  . KNEE ARTHROSCOPY Left 2008  . lip biopsy     done at MD office Fri 11/22/13  . MASS EXCISION Right 08/15/2013   Procedure: RIGHT INDEX EXCISION MASS ;  Surgeon: Tennis Must, MD;  Location: [place];  Service: Orthopedics;  Laterality: Right;  . NASAL SEPTUM SURGERY    . OOPHORECTOMY Right 2000  . SHOULDER ARTHROSCOPY WITH OPEN ROTATOR CUFF REPAIR AND DISTAL CLAVICLE ACROMINECTOMY Right 11/26/2013   Procedure: RIGHT SHOULDER ARTHROSCOPY WITH MINI OPEN ROTATOR CUFF REPAIR AND DISTAL CLAVICLE RESECTION, SUBACROMIAL DECOMPRESSION, POSSIBLE [facility] PATCH.;  Surgeon: [name], MD;  Location: [place];  Service: Orthopedics;  Laterality: Right;  . TOTAL KNEE ARTHROPLASTY Right 03/21/2017  . TOTAL KNEE ARTHROPLASTY Right 03/21/2017   Procedure: RIGHT TOTAL KNEE ARTHROPLASTY;  Surgeon: [name], MD;  Location: [place];  Service: Orthopedics;  Laterality: Right;  . TUBAL LIGATION      There were no vitals filed for this visit.  Subjective Assessment - 03/19/18 1036    Subjective  Patient reports she kept her grandchildren this weekend and her foot "did well" with "all the walking". Patient reports 1/10 pain this morning in her arch. Patient reports compliance with her HEP  Pertinent History  Patient is a 67 year old female presenting today with L dorsum foot pain. Patient reports this pain began 3 months ago. Patient with PMH of R TKA, where she was seen here for PT, and reports she was "favoring the L side" and thinks this is where her pain began from. Patient reports over the past 2 months she has had 4 injections and is using costum orthotics, which she reports has helped with her pain 65%. Patient reports with prolonged walking she has a "little pain" if she is wearing her orthotics, and increased pain when she is not wearing her orthotics (which do not go in all of her shoes). Patient is retired, but reports she is unable to complete hobbies of  hunting, kayaking, biking, and playing with dogs. Patient reports pain "any time she is on her feet". Patient reports worst pain in the past week : 4/10 ; best : 1/10. Pt denies N/V, B&B changes, unexplained weight fluctuation, saddle paresthesia, fever, night sweats, or unrelenting night pain at this time.    Limitations  Standing;Walking;House hold activities    How long can you sit comfortably?  unlimited    How long can you stand comfortably?  w/0 orthotic 67 mn    How long can you walk comfortably?  w/0 orthotic 140m    Diagnostic tests  None yet    Patient Stated Goals  Wants to go to hunting and kayaking, and walk her 5 dogs.     Pain Onset  More than a month ago         Therapeutic Exercise: -Educated patient on over peroneal activation/tension d/t toe out with gait.  - Standing heel raises 3x 10 with elevated mat support with cuing for eccentric control -Prone  hip ext with glute contraction prior 3x 10 each LE (alt) with TC to prevent hip ext and LB "arch" - STS from elevated mat table with demo and TC initially for proper form with good carry over following 2x 8 - Bridge 3x 8 at the end of therex with good noted glute contraction without "hamstring cramp"  Manual - STM withtrigger point releaseto L lateral gastroc/peroneals - STM withtrigger point releasetomedial and transverse arch with "ease up" putty - Manual DF stretch x2 30sec hold                         PT Education - 03/19/18 1057    Education provided  Yes    Education Details  Exercise form/exercise    Person(s) Educated  Patient    Methods  Explanation;Demonstration;Tactile cues;Verbal cues    Comprehension  Verbalized understanding;Returned demonstration;Tactile cues required;Verbal cues required       PT Short Term Goals - 02/15/18 1648      PT SHORT TERM GOAL #1   Title  Pt will be independent with HEP in order to improve strength and balance in order to decrease fall risk  and improve function at home and work    Time  4    Period  Weeks    Status  New        PT Long Term Goals - 02/15/18 1648      PT LONG TERM GOAL #1   Title  Pt will increase 10MWT by at least 0.13 m/s in order to demonstrate clinically significant improvement in community ambulation    Baseline  02/15/18: 0.8313m   Time  8    Period  Weeks  Status  New      PT LONG TERM GOAL #2   Title  Pt will decrease 5TSTS by at least 3 seconds in order to demonstrate clinically significant improvement in LE strength    Baseline  02/15/18: 8sec    Time  8    Period  Weeks    Status  New      PT LONG TERM GOAL #3   Title  Pt will decrease worst pain as reported on NPRS by at least 3 points in order to demonstrate clinically significant reduction in pain.    Baseline  02/15/18 4/10    Time  8    Period  Weeks    Status  New      PT LONG TERM GOAL #4   Title  Patient will increase FOTO score to 56 to demonstrate predicted increase in functional mobility to complete ADLs    Baseline  02/15/18 45    Time  8    Period  Weeks    Status  New            Plan - 03/19/18 1110    Clinical Impression Statement  Patient is continuing to report decreased pain between sessions. Patient with some noted trigger points/tension through manual techniques, that resolves 75%. D/t decreased pain and proper muscle activation, PT is able to progress therex to increase to functional movements. Patient continue to require some cuing throuh therex for proper form/muscle activation with good carry over following. Patient will continue to benefit from skilled PT to address strength deficits, pain, and gait mechanics.     Rehab Potential  Fair    Clinical Impairments Affecting Rehab Potential  (-) healing time, pain; (+) husband support, acute condition, motivated    PT Frequency  2x / week    PT Duration  6 weeks    PT Treatment/Interventions  Neuromuscular re-education;Therapeutic exercise;Therapeutic  activities;Manual techniques;Aquatic Therapy;Electrical Stimulation;Iontophoresis 33m/ml Dexamethasone;Gait training;Stair training;Balance training;Functional mobility training;Patient/family education;Scar mobilization;Passive range of motion;Dry needling    PT Next Visit Plan  Manual techniques for pain relief, strengthening for postural carry over    PT Home Exercise Plan  great toe stretch, DF stretc, bridge    Consulted and Agree with Plan of Care  Patient       Patient will benefit from skilled therapeutic intervention in order to improve the following deficits and impairments:  Pain, Difficulty walking, Decreased strength, Decreased scar mobility, Decreased range of motion  Visit Diagnosis: Muscle weakness (generalized)     Problem List Patient Active Problem List   Diagnosis Date Noted  . History of total right knee replacement 03/21/2017  . Chronic diarrhea 12/07/2014  . Segmental colitis with rectal bleeding [place] 09/12/2014  . Routine general medical examination at a health care facility 06/05/2014  . Overweight 06/13/2011  . ABNORMAL CHEST XRAY 09/30/2007  . Hyperlipidemia 02/17/2007  . Essential hypertension 01/08/2007  . Osteoarthritis 01/08/2007   CShelton SilvasPT, DPT CShelton Silvas12/12/2017, 11:16 AM  [facility] [address] [place] [address]Phone: [phone]  Fax:  [phone] Name: NDASJA BRASEMRN: [id number] of Birth: [birth year]

## 2018-03-20 ENCOUNTER — Ambulatory Visit: Payer: Medicare Other | Admitting: Physical Therapy

## 2018-03-21 ENCOUNTER — Encounter: Payer: Self-pay | Admitting: Physical Therapy

## 2018-03-21 ENCOUNTER — Ambulatory Visit: Payer: Medicare Other | Admitting: Physical Therapy

## 2018-03-21 DIAGNOSIS — M25572 Pain in left ankle and joints of left foot: Secondary | ICD-10-CM | POA: Diagnosis not present

## 2018-03-21 DIAGNOSIS — M6281 Muscle weakness (generalized): Secondary | ICD-10-CM | POA: Diagnosis not present

## 2018-03-21 DIAGNOSIS — R262 Difficulty in walking, not elsewhere classified: Secondary | ICD-10-CM | POA: Diagnosis not present

## 2018-03-21 NOTE — Therapy (Signed)
[facility] [address] [place], [address] Phone: [phone]   Fax:  [phone]  Physical Therapy Treatment  Patient Details  Name: [name] MRN: [id number] Date of Birth: [birth year] Referring Provider (PT): Lenon Oms MD   Encounter Date: 03/21/2018  PT End of Session - 03/21/18 1059    Visit Number  8    Number of Visits  17    Date for PT Re-Evaluation  04/12/18    PT Start Time  1030    PT Stop Time  1115    PT Time Calculation (min)  45 min    Activity Tolerance  Patient tolerated treatment well    Behavior During Therapy  [facility] for tasks assessed/performed       Past Medical History:  Diagnosis Date  . Arthritis    back, fingers with joint pain and swelling.  chronic back pain  . Chronic back pain    arthritis   . Diverticulosis   . Elevated cholesterol    takes Niacin daily and Simvastatin  . GERD (gastroesophageal reflux disease) 06/2004   non-specific gastritis on EGD 06/2004  . Headache(784.0)    occasionally  . History of colon polyps 2004, 2009   2004:adenomatous. 2009 hyperplastic.   Marland Kitchen History of hiatal hernia   . Hypertension    takes Tenoretic and Lisinopril daily  . Mucoid cyst of joint 08/2013   right index finger  . Osteopenia 01/2014   T score -1.1 FRAX 14%/0.5%. Stable from prior DEXA  . PONV (postoperative nausea and vomiting)   . Seasonal allergies   . Stroke ([place]) 1998   x 2 - mild left-sided weakness  . Urge incontinence   . Uterine prolapse     Past Surgical History:  Procedure Laterality Date  . BACK SURGERY     x 2  . BELPHAROPTOSIS REPAIR Bilateral 12/14/2017  . CATARACT EXTRACTION    . CATARACT EXTRACTION Bilateral 10/2017  . COLONOSCOPY  2004, 2009, 2014  . FINGER SURGERY    . GUM SURGERY    . GYNECOLOGIC CRYOSURGERY    . HYSTEROSCOPY W/D&C  12/07/2010   with resection of endometrial polyp  . KNEE ARTHROSCOPY Left 2008  . lip biopsy      done at MD office Fri 11/22/13  . MASS EXCISION Right 08/15/2013   Procedure: RIGHT INDEX EXCISION MASS ;  Surgeon: Tennis Must, MD;  Location: [place];  Service: Orthopedics;  Laterality: Right;  . NASAL SEPTUM SURGERY    . OOPHORECTOMY Right 2000  . SHOULDER ARTHROSCOPY WITH OPEN ROTATOR CUFF REPAIR AND DISTAL CLAVICLE ACROMINECTOMY Right 11/26/2013   Procedure: RIGHT SHOULDER ARTHROSCOPY WITH MINI OPEN ROTATOR CUFF REPAIR AND DISTAL CLAVICLE RESECTION, SUBACROMIAL DECOMPRESSION, POSSIBLE [facility] PATCH.;  Surgeon: [name], MD;  Location: [place];  Service: Orthopedics;  Laterality: Right;  . TOTAL KNEE ARTHROPLASTY Right 03/21/2017  . TOTAL KNEE ARTHROPLASTY Right 03/21/2017   Procedure: RIGHT TOTAL KNEE ARTHROPLASTY;  Surgeon: [name], MD;  Location: [place];  Service: Orthopedics;  Laterality: Right;  . TUBAL LIGATION      There were no vitals filed for this visit.  Subjective Assessment - 03/21/18 1037    Subjective  Patient reports she thinks she is having pain less often, which she is happy with. Patient reports 1/10 pain tihs morning. Patient reports compliance with HEP with no questions or concerns.     Pertinent History  Patient  is a 67 year old female presenting today with L dorsum foot pain. Patient reports this pain began 3 months ago. Patient with PMH of R TKA, where she was seen here for PT, and reports she was "favoring the L side" and thinks this is where her pain began from. Patient reports over the past 2 months she has had 4 injections and is using costum orthotics, which she reports has helped with her pain 65%. Patient reports with prolonged walking she has a "little pain" if she is wearing her orthotics, and increased pain when she is not wearing her orthotics (which do not go in all of her shoes). Patient is retired, but reports she is unable to complete hobbies of hunting, kayaking, biking, and playing with dogs. Patient reports pain "any  time she is on her feet". Patient reports worst pain in the past week : 4/10 ; best : 1/10. Pt denies N/V, B&B changes, unexplained weight fluctuation, saddle paresthesia, fever, night sweats, or unrelenting night pain at this time.    Limitations  Standing;Walking;House hold activities    How long can you sit comfortably?  unlimited    How long can you stand comfortably?  w/0 orthotic 17mn    How long can you walk comfortably?  w/0 orthotic 183m    Pain Onset  More than a month ago           Therapeutic Exercise: -Runners stretch with RLE march to replicate stretch with walking 3x 30sec  - SLS LLE w/ rebounder ball toss 3x 10 with noted ankle and hip strategy with occasional foot down to maintain balance -MATRIX hip abd 25# 3x 8 wit cuing for posture and eccentric control  -MATRIX hip abd 25# 3x 8 wit cuing for posture and eccentric control  - Attempted mini lunge with LLE on large dina disc with knee pain inhibiting exercise; discontinued  - Dina disc weight shifting laterally x12; ant/post x12  Manual - STM withtrigger point releaseto L lateral gastroc/peroneals - STM withtrigger point releasetomedial and transverse archwith "ease up" putty - Manual DF stretch x230sec hold                      PT Education - 03/21/18 1050    Education provided  Yes    Education Details  Exercise form/technique    Person(s) Educated  Patient    Methods  Explanation;Verbal cues;Demonstration    Comprehension  Verbalized understanding;Verbal cues required;Returned demonstration       PT Short Term Goals - 02/15/18 1648      PT SHORT TERM GOAL #1   Title  Pt will be independent with HEP in order to improve strength and balance in order to decrease fall risk and improve function at home and work    Time  4    Period  Weeks    Status  New        PT Long Term Goals - 02/15/18 1648      PT LONG TERM GOAL #1   Title  Pt will increase 10MWT by at least 0.13 m/s  in order to demonstrate clinically significant improvement in community ambulation    Baseline  02/15/18: 0.8354m   Time  8    Period  Weeks    Status  New      PT LONG TERM GOAL #2   Title  Pt will decrease 5TSTS by at least 3 seconds in order to demonstrate clinically significant improvement in  LE strength    Baseline  02/15/18: 8sec    Time  8    Period  Weeks    Status  New      PT LONG TERM GOAL #3   Title  Pt will decrease worst pain as reported on NPRS by at least 3 points in order to demonstrate clinically significant reduction in pain.    Baseline  02/15/18 4/10    Time  8    Period  Weeks    Status  New      PT LONG TERM GOAL #4   Title  Patient will increase FOTO score to 56 to demonstrate predicted increase in functional mobility to complete ADLs    Baseline  02/15/18 45    Time  8    Period  Weeks    Status  New            Plan - 03/21/18 1131    Clinical Impression Statement  Patient is continuing to have some trigger points that have improved slightly following massage therapy appointment. PT progressed therex to increase wt bearing demand. Patient is able to complete all therex with accuracy, following PT cuing, with no increased pain only muscle fatigue. PT spent time educating patient on inc wt bearing demand to strengthen/stretch in the position that she uses it in (for gait/standing/bending/stooping)    Rehab Potential  Fair    Clinical Impairments Affecting Rehab Potential  (-) healing time, pain; (+) husband support, acute condition, motivated    PT Frequency  2x / week    PT Duration  6 weeks    PT Treatment/Interventions  Neuromuscular re-education;Therapeutic exercise;Therapeutic activities;Manual techniques;Aquatic Therapy;Electrical Stimulation;Iontophoresis 69m/ml Dexamethasone;Gait training;Stair training;Balance training;Functional mobility training;Patient/family education;Scar mobilization;Passive range of motion;Dry needling    PT Next Visit Plan   Manual techniques for pain relief, strengthening for postural carry over    PT Home Exercise Plan  great toe stretch, DF stretc, bridge    Consulted and Agree with Plan of Care  Patient       Patient will benefit from skilled therapeutic intervention in order to improve the following deficits and impairments:  Pain, Difficulty walking, Decreased strength, Decreased scar mobility, Decreased range of motion  Visit Diagnosis: Muscle weakness (generalized)     Problem List Patient Active Problem List   Diagnosis Date Noted  . History of total right knee replacement 03/21/2017  . Chronic diarrhea 12/07/2014  . Segmental colitis with rectal bleeding ([place] 09/12/2014  . Routine general medical examination at a health care facility 06/05/2014  . Overweight 06/13/2011  . ABNORMAL CHEST XRAY 09/30/2007  . Hyperlipidemia 02/17/2007  . Essential hypertension 01/08/2007  . Osteoarthritis 01/08/2007   CShelton SilvasPT, DPT CShelton Silvas12/02/2018, 11:51 AM  [facility] [address] [place] [address]Phone: [phone]  Fax:  [phone] Name: Stacy SESTAKMRN: [id number] of Birth: [birth year]

## 2018-03-22 ENCOUNTER — Encounter: Payer: Medicare Other | Admitting: Physical Therapy

## 2018-03-27 ENCOUNTER — Encounter: Payer: Self-pay | Admitting: Physical Therapy

## 2018-03-27 ENCOUNTER — Ambulatory Visit: Payer: Medicare Other | Admitting: Physical Therapy

## 2018-03-27 DIAGNOSIS — M25572 Pain in left ankle and joints of left foot: Secondary | ICD-10-CM

## 2018-03-27 DIAGNOSIS — R262 Difficulty in walking, not elsewhere classified: Secondary | ICD-10-CM

## 2018-03-27 DIAGNOSIS — M6281 Muscle weakness (generalized): Secondary | ICD-10-CM

## 2018-03-27 NOTE — Therapy (Signed)
[place] [facility] [address] [place], [address] Phone: [phone]   Fax:  [phone]  Physical Therapy Treatment  Patient Details  Name: [name] MRN: [id number] Date of Birth: 12/07/1950 Referring Provider (PT): Lenon Oms MD   Encounter Date: 03/27/2018  PT End of Session - 03/27/18 1357    Visit Number  9    Number of Visits  17    Date for PT Re-Evaluation  04/12/18    PT Start Time  0130    PT Stop Time  0215    PT Time Calculation (min)  45 min    Activity Tolerance  Patient tolerated treatment well    Behavior During Therapy  [facility] for tasks assessed/performed       Past Medical History:  Diagnosis Date  . Arthritis    back, fingers with joint pain and swelling.  chronic back pain  . Chronic back pain    arthritis   . Diverticulosis   . Elevated cholesterol    takes Niacin daily and Simvastatin  . GERD (gastroesophageal reflux disease) 06/2004   non-specific gastritis on EGD 06/2004  . Headache(784.0)    occasionally  . History of colon polyps 2004, 2009   2004:adenomatous. 2009 hyperplastic.   Marland Kitchen History of hiatal hernia   . Hypertension    takes Tenoretic and Lisinopril daily  . Mucoid cyst of joint 08/2013   right index finger  . Osteopenia 01/2014   T score -1.1 FRAX 14%/0.5%. Stable from prior DEXA  . PONV (postoperative nausea and vomiting)   . Seasonal allergies   . Stroke ([place]) 1998   x 2 - mild left-sided weakness  . Urge incontinence   . Uterine prolapse     Past Surgical History:  Procedure Laterality Date  . BACK SURGERY     x 2  . BELPHAROPTOSIS REPAIR Bilateral 12/14/2017  . CATARACT EXTRACTION    . CATARACT EXTRACTION Bilateral 10/2017  . COLONOSCOPY  2004, 2009, 2014  . FINGER SURGERY    . GUM SURGERY    . GYNECOLOGIC CRYOSURGERY    . HYSTEROSCOPY W/D&C  12/07/2010   with resection of endometrial polyp  . KNEE ARTHROSCOPY Left 2008  . lip biopsy      done at MD office Fri 11/22/13  . MASS EXCISION Right 08/15/2013   Procedure: RIGHT INDEX EXCISION MASS ;  Surgeon: Tennis Must, MD;  Location: [place];  Service: Orthopedics;  Laterality: Right;  . NASAL SEPTUM SURGERY    . OOPHORECTOMY Right 2000  . SHOULDER ARTHROSCOPY WITH OPEN ROTATOR CUFF REPAIR AND DISTAL CLAVICLE ACROMINECTOMY Right 11/26/2013   Procedure: RIGHT SHOULDER ARTHROSCOPY WITH MINI OPEN ROTATOR CUFF REPAIR AND DISTAL CLAVICLE RESECTION, SUBACROMIAL DECOMPRESSION, POSSIBLE [facility] PATCH.;  Surgeon: [name], MD;  Location: [place];  Service: Orthopedics;  Laterality: Right;  . TOTAL KNEE ARTHROPLASTY Right 03/21/2017  . TOTAL KNEE ARTHROPLASTY Right 03/21/2017   Procedure: RIGHT TOTAL KNEE ARTHROPLASTY;  Surgeon: [name], MD;  Location: [place];  Service: Orthopedics;  Laterality: Right;  . TUBAL LIGATION      There were no vitals filed for this visit.  Subjective Assessment - 03/27/18 1335    Subjective  Patient reports she having minimal pain today 0.5/10, but reports some tension at the bottom of the foot and the calf, but reports she is doing better overall. Compliance with HEP with no questions or concerns.  Pertinent History  Patient is a 67 year old female presenting today with L dorsum foot pain. Patient reports this pain began 3 months ago. Patient with PMH of R TKA, where she was seen here for PT, and reports she was "favoring the L side" and thinks this is where her pain began from. Patient reports over the past 2 months she has had 4 injections and is using costum orthotics, which she reports has helped with her pain 65%. Patient reports with prolonged walking she has a "little pain" if she is wearing her orthotics, and increased pain when she is not wearing her orthotics (which do not go in all of her shoes). Patient is retired, but reports she is unable to complete hobbies of hunting, kayaking, biking, and playing with dogs.  Patient reports pain "any time she is on her feet". Patient reports worst pain in the past week : 4/10 ; best : 1/10. Pt denies N/V, B&B changes, unexplained weight fluctuation, saddle paresthesia, fever, night sweats, or unrelenting night pain at this time.    Limitations  Standing;Walking;House hold activities    How long can you sit comfortably?  unlimited    How long can you stand comfortably?  w/0 orthotic 69mn    How long can you walk comfortably?  w/0 orthotic 164m    Diagnostic tests  None yet    Patient Stated Goals  Wants to go to hunting and kayaking, and walk her 5 dogs.     Pain Onset  More than a month ago       Therapeutic Exercise: -Step up 4in step RLE leading and no push off with LLE (2 finger support at treadmill bar) - LLE on airex with RLE toe down (unable to complete SLS) w/ rebounder ball toss 3x 10 with noted ankle and hip strategy with occasional foot down to maintain balance - Biodex limits of postural stability for inc wt shifting as a precursor to SLS (no UE support) middle level: 25% 66m58m 42% 48sec; 42% 42sec - Sidestepping on airex pad with red tband down and back x2 for 3 reps with cuing for eccentric control with good carry over following   Manual - STM withtrigger point releaseto Llateralgastroc/peroneals - STM withtrigger point releasetomedial and transverse archwith "ease up" putty - Manual DF stretch x230sec hold                        PT Education - 03/27/18 1356    Education provided  Yes    Education Details  Exercise form    Person(s) Educated  Patient    Methods  Explanation;Demonstration;Verbal cues    Comprehension  Verbalized understanding;Returned demonstration;Verbal cues required       PT Short Term Goals - 02/15/18 1648      PT SHORT TERM GOAL #1   Title  Pt will be independent with HEP in order to improve strength and balance in order to decrease fall risk and improve function at home and work     Time  4    Period  Weeks    Status  New        PT Long Term Goals - 02/15/18 1648      PT LONG TERM GOAL #1   Title  Pt will increase 10MWT by at least 0.13 m/s in order to demonstrate clinically significant improvement in community ambulation    Baseline  02/15/18: 0.66m17m  Time  8  Period  Weeks    Status  New      PT LONG TERM GOAL #2   Title  Pt will decrease 5TSTS by at least 3 seconds in order to demonstrate clinically significant improvement in LE strength    Baseline  02/15/18: 8sec    Time  8    Period  Weeks    Status  New      PT LONG TERM GOAL #3   Title  Pt will decrease worst pain as reported on NPRS by at least 3 points in order to demonstrate clinically significant reduction in pain.    Baseline  02/15/18 4/10    Time  8    Period  Weeks    Status  New      PT LONG TERM GOAL #4   Title  Patient will increase FOTO score to 56 to demonstrate predicted increase in functional mobility to complete ADLs    Baseline  02/15/18 45    Time  8    Period  Weeks    Status  New            Plan - 03/27/18 1408    Clinical Impression Statement  PT continued therex progression for hip and ankle stability, which patient is able to tolerate well with good form following demo/cuing. Patient reported pain relief with manual techniques. Will continue follow up as scheduled.     Rehab Potential  Fair    Clinical Impairments Affecting Rehab Potential  (-) healing time, pain; (+) husband support, acute condition, motivated    PT Frequency  2x / week    PT Duration  6 weeks    PT Treatment/Interventions  Neuromuscular re-education;Therapeutic exercise;Therapeutic activities;Manual techniques;Aquatic Therapy;Electrical Stimulation;Iontophoresis 5m/ml Dexamethasone;Gait training;Stair training;Balance training;Functional mobility training;Patient/family education;Scar mobilization;Passive range of motion;Dry needling    PT Next Visit Plan  Manual techniques for pain relief,  strengthening for postural carry over    PT Home Exercise Plan  great toe stretch, DF stretc, bridge    Consulted and Agree with Plan of Care  Patient       Patient will benefit from skilled therapeutic intervention in order to improve the following deficits and impairments:  Pain, Difficulty walking, Decreased strength, Decreased scar mobility, Decreased range of motion  Visit Diagnosis: Muscle weakness (generalized)  Difficulty in walking, not elsewhere classified  Pain in left ankle and joints of left foot     Problem List Patient Active Problem List   Diagnosis Date Noted  . History of total right knee replacement 03/21/2017  . Chronic diarrhea 12/07/2014  . Segmental [place] 09/12/2014  . Routine general medical examination at a health care facility 06/05/2014  . Overweight 06/13/2011  . ABNORMAL CHEST XRAY 09/30/2007  . Hyperlipidemia 02/17/2007  . Essential hypertension 01/08/2007  . Osteoarthritis 01/08/2007   CShelton SilvasPT, DPT CShelton Silvas12/17/2019, 2:17 PM  [facility] [address] [place] [address]Phone: [phone]  Fax:  [phone] Name: NMARIBELLA KUNAMRN: [id number] of Birth: 312/08/1950

## 2018-03-28 DIAGNOSIS — M722 Plantar fascial fibromatosis: Secondary | ICD-10-CM | POA: Diagnosis not present

## 2018-03-29 ENCOUNTER — Encounter: Payer: Self-pay | Admitting: Physical Therapy

## 2018-03-29 ENCOUNTER — Ambulatory Visit: Payer: Medicare Other | Admitting: Physical Therapy

## 2018-03-29 DIAGNOSIS — R262 Difficulty in walking, not elsewhere classified: Secondary | ICD-10-CM | POA: Diagnosis not present

## 2018-03-29 DIAGNOSIS — M6281 Muscle weakness (generalized): Secondary | ICD-10-CM

## 2018-03-29 DIAGNOSIS — M25572 Pain in left ankle and joints of left foot: Secondary | ICD-10-CM

## 2018-03-29 NOTE — Therapy (Signed)
[place] [facility] [address], [place], [address] Phone: [phone]   Fax:  [phone]  Physical Therapy Treatment  Patient Details  Name: [name] MRN: [id number] Date of Birth: April 13, 1950 Referring Provider (PT): Lenon Oms MD   Encounter Date: 03/29/2018  PT End of Session - 03/29/18 1316    Visit Number  10    Number of Visits  17    Date for PT Re-Evaluation  04/12/18    PT Start Time  0100    PT Stop Time  0140    PT Time Calculation (min)  40 min    Activity Tolerance  Patient tolerated treatment well    Behavior During Therapy  [facility] for tasks assessed/performed       Past Medical History:  Diagnosis Date  . Arthritis    back, fingers with joint pain and swelling.  chronic back pain  . Chronic back pain    arthritis   . Diverticulosis   . Elevated cholesterol    takes Niacin daily and Simvastatin  . GERD (gastroesophageal reflux disease) 06/2004   non-specific gastritis on EGD 06/2004  . Headache(784.0)    occasionally  . History of colon polyps 2004, 2009   2004:adenomatous. 2009 hyperplastic.   Marland Kitchen History of hiatal hernia   . Hypertension    takes Tenoretic and Lisinopril daily  . Mucoid cyst of joint 08/2013   right index finger  . Osteopenia 01/2014   T score -1.1 FRAX 14%/0.5%. Stable from prior DEXA  . PONV (postoperative nausea and vomiting)   . Seasonal allergies   . Stroke ([place]) 1998   x 2 - mild left-sided weakness  . Urge incontinence   . Uterine prolapse     Past Surgical History:  Procedure Laterality Date  . BACK SURGERY     x 2  . BELPHAROPTOSIS REPAIR Bilateral 12/14/2017  . CATARACT EXTRACTION    . CATARACT EXTRACTION Bilateral 10/2017  . COLONOSCOPY  2004, 2009, 2014  . FINGER SURGERY    . GUM SURGERY    . GYNECOLOGIC CRYOSURGERY    . HYSTEROSCOPY W/D&C  12/07/2010   with resection of endometrial polyp  . KNEE ARTHROSCOPY Left 2008  . lip biopsy      done at MD office Fri 11/22/13  . MASS EXCISION Right 08/15/2013   Procedure: RIGHT INDEX EXCISION MASS ;  Surgeon: Tennis Must, MD;  Location: [place];  Service: Orthopedics;  Laterality: Right;  . NASAL SEPTUM SURGERY    . OOPHORECTOMY Right 2000  . SHOULDER ARTHROSCOPY WITH OPEN ROTATOR CUFF REPAIR AND DISTAL CLAVICLE ACROMINECTOMY Right 11/26/2013   Procedure: RIGHT SHOULDER ARTHROSCOPY WITH MINI OPEN ROTATOR CUFF REPAIR AND DISTAL CLAVICLE RESECTION, SUBACROMIAL DECOMPRESSION, POSSIBLE [facility] PATCH.;  Surgeon: [name], MD;  Location: [place];  Service: Orthopedics;  Laterality: Right;  . TOTAL KNEE ARTHROPLASTY Right 03/21/2017  . TOTAL KNEE ARTHROPLASTY Right 03/21/2017   Procedure: RIGHT TOTAL KNEE ARTHROPLASTY;  Surgeon: [name], MD;  Location: [place];  Service: Orthopedics;  Laterality: Right;  . TUBAL LIGATION      There were no vitals filed for this visit.  Subjective Assessment - 03/29/18 1304    Subjective  Patient reports she saw her PCP yesterday and he said to continue with PT, and to return back in 3-4 months if pain persists and then she would be a surgical candidate for tendon release. Patient reports 1/10 pain  today, and reports pain has been up to 2/10 over this week, after prolonged walking    Pertinent History  Patient is a 67 year old female presenting today with L dorsum foot pain. Patient reports this pain began 3 months ago. Patient with PMH of R TKA, where she was seen here for PT, and reports she was "favoring the L side" and thinks this is where her pain began from. Patient reports over the past 2 months she has had 4 injections and is using costum orthotics, which she reports has helped with her pain 65%. Patient reports with prolonged walking she has a "little pain" if she is wearing her orthotics, and increased pain when she is not wearing her orthotics (which do not go in all of her shoes). Patient is retired, but reports she is  unable to complete hobbies of hunting, kayaking, biking, and playing with dogs. Patient reports pain "any time she is on her feet". Patient reports worst pain in the past week : 4/10 ; best : 1/10. Pt denies N/V, B&B changes, unexplained weight fluctuation, saddle paresthesia, fever, night sweats, or unrelenting night pain at this time.    Limitations  Standing;Walking;House hold activities    How long can you sit comfortably?  unlimited    How long can you stand comfortably?  w/0 orthotic 61mn    How long can you walk comfortably?  w/0 orthotic 176m    Diagnostic tests  None yet    Patient Stated Goals  Wants to go to hunting and kayaking, and walk her 5 dogs.     Pain Onset  More than a month ago         Therapeutic Exercise: -SLS ball toss with therapist throwing ball outside pt BOS to promote balance with reaching 3x 10  - Biodex limits of postural stability for inc wt shifting as a precursor to SLS (no UE support) middle level: 80% 33sec; hard level: 79% 39sec, 87% in 32sec - Mini squat x10 with cuing for proper form with good carry over following; on airex pad 2x 10 for increased ankle stability demand - Attempted front lunge onto bosu ball, knee pain after 6 so discontinued  - Bosu ball wt shifting R and L 3x 10 and forward/back 3x 10 with demo and cuing for controlled wt shifting with good carry over following   Manual - STM withtrigger point releaseto Llateralgastroc and medial gastroc with increased time spent on medial  - STM withtrigger point releasetomedial and transverse archwith "ease up" putty - Manual DF stretch x230sec hold                       PT Education - 03/29/18 1342    Education provided  Yes    Education Details  Exercise form    Person(s) Educated  Patient    Methods  Explanation;Demonstration;Verbal cues    Comprehension  Verbalized understanding;Returned demonstration;Verbal cues required       PT Short Term Goals -  02/15/18 1648      PT SHORT TERM GOAL #1   Title  Pt will be independent with HEP in order to improve strength and balance in order to decrease fall risk and improve function at home and work    Time  4    Period  Weeks    Status  New        PT Long Term Goals - 02/15/18 1648      PT LONG TERM  GOAL #1   Title  Pt will increase 10MWT by at least 0.13 m/s in order to demonstrate clinically significant improvement in community ambulation    Baseline  02/15/18: 0.66ms    Time  8    Period  Weeks    Status  New      PT LONG TERM GOAL #2   Title  Pt will decrease 5TSTS by at least 3 seconds in order to demonstrate clinically significant improvement in LE strength    Baseline  02/15/18: 8sec    Time  8    Period  Weeks    Status  New      PT LONG TERM GOAL #3   Title  Pt will decrease worst pain as reported on NPRS by at least 3 points in order to demonstrate clinically significant reduction in pain.    Baseline  02/15/18 4/10    Time  8    Period  Weeks    Status  New      PT LONG TERM GOAL #4   Title  Patient will increase FOTO score to 56 to demonstrate predicted increase in functional mobility to complete ADLs    Baseline  02/15/18 45    Time  8    Period  Weeks    Status  New            Plan - 03/29/18 1336    Clinical Impression Statement  Patient with increased tension at medial gastroc this session, requiring increased manual techniques to subside. PT progressed therex for ankle stability, which patient is able to tolerate well with no increased pain, only noted muscle fatigue. Patient requires demo and cuing from PT for proper form with therex, which patient is able to complete accurately following. PT will continue progression as able.     Rehab Potential  Fair    Clinical Impairments Affecting Rehab Potential  (-) healing time, pain; (+) husband support, acute condition, motivated    PT Frequency  2x / week    PT Duration  6 weeks    PT Treatment/Interventions   Neuromuscular re-education;Therapeutic exercise;Therapeutic activities;Manual techniques;Aquatic Therapy;Electrical Stimulation;Iontophoresis 414mml Dexamethasone;Gait training;Stair training;Balance training;Functional mobility training;Patient/family education;Scar mobilization;Passive range of motion;Dry needling    PT Next Visit Plan  Manual techniques for pain relief, strengthening for postural carry over    PT Home Exercise Plan  great toe stretch, DF stretc, bridge    Consulted and Agree with Plan of Care  Patient       Patient will benefit from skilled therapeutic intervention in order to improve the following deficits and impairments:  Pain, Difficulty walking, Decreased strength, Decreased scar mobility, Decreased range of motion  Visit Diagnosis: Muscle weakness (generalized)  Difficulty in walking, not elsewhere classified  Pain in left ankle and joints of left foot     Problem List Patient Active Problem List   Diagnosis Date Noted  . History of total right knee replacement 03/21/2017  . Chronic diarrhea 12/07/2014  . Segmental colitis with rectal bleeding ([place]/06[place]2014  . Routine general medical examination at a health care facility 06/05/2014  . Overweight 06/13/2011  . ABNORMAL CHEST XRAY 09/30/2007  . Hyperlipidemia 02/17/2007  . Essential hypertension 01/08/2007  . Osteoarthritis 01/08/2007   ChShelton SilvasT, DPT ChShelton Silvas2/19/2019, 2:06 PM  [facility] [address]hone: [phone] Fax:  [phone]: Stacy DEJONGERN: [id number]  of Birth: [birth year]

## 2018-04-02 ENCOUNTER — Encounter: Payer: Self-pay | Admitting: Physical Therapy

## 2018-04-02 ENCOUNTER — Ambulatory Visit: Payer: Medicare Other | Admitting: Physical Therapy

## 2018-04-02 DIAGNOSIS — R262 Difficulty in walking, not elsewhere classified: Secondary | ICD-10-CM

## 2018-04-02 DIAGNOSIS — M25572 Pain in left ankle and joints of left foot: Secondary | ICD-10-CM | POA: Diagnosis not present

## 2018-04-02 DIAGNOSIS — M6281 Muscle weakness (generalized): Secondary | ICD-10-CM

## 2018-04-02 NOTE — Therapy (Addendum)
[facility] [address] [place], [address] Phone: [phone]   Fax:  [phone]  Physical Therapy Treatment  Patient Details  Name: [name] MRN: [id number] Date of Birth: Mar 30, 1951 Referring Provider (PT): Lenon Oms MD   Encounter Date: 04/02/2018  PT End of Session - 04/02/18 1358    Visit Number  11   Number of Visits  17    Date for PT Re-Evaluation  04/12/18    PT Start Time  0150    PT Stop Time  0230    PT Time Calculation (min)  40 min    Activity Tolerance  Patient tolerated treatment well    Behavior During Therapy  [facility] for tasks assessed/performed       Past Medical History:  Diagnosis Date  . Arthritis    back, fingers with joint pain and swelling.  chronic back pain  . Chronic back pain    arthritis   . Diverticulosis   . Elevated cholesterol    takes Niacin daily and Simvastatin  . GERD (gastroesophageal reflux disease) 06/2004   non-specific gastritis on EGD 06/2004  . Headache(784.0)    occasionally  . History of colon polyps 2004, 2009   2004:adenomatous. 2009 hyperplastic.   Marland Kitchen History of hiatal hernia   . Hypertension    takes Tenoretic and Lisinopril daily  . Mucoid cyst of joint 08/2013   right index finger  . Osteopenia 01/2014   T score -1.1 FRAX 14%/0.5%. Stable from prior DEXA  . PONV (postoperative nausea and vomiting)   . Seasonal allergies   . Stroke ([place]) 1998   x 2 - mild left-sided weakness  . Urge incontinence   . Uterine prolapse     Past Surgical History:  Procedure Laterality Date  . BACK SURGERY     x 2  . BELPHAROPTOSIS REPAIR Bilateral 12/14/2017  . CATARACT EXTRACTION    . CATARACT EXTRACTION Bilateral 10/2017  . COLONOSCOPY  2004, 2009, 2014  . FINGER SURGERY    . GUM SURGERY    . GYNECOLOGIC CRYOSURGERY    . HYSTEROSCOPY W/D&C  12/07/2010   with resection of endometrial polyp  . KNEE ARTHROSCOPY Left 2008  . lip biopsy      done at MD office Fri 11/22/13  . MASS EXCISION Right 08/15/2013   Procedure: RIGHT INDEX EXCISION MASS ;  Surgeon: Tennis Must, MD;  Location: [facility];  Service: Orthopedics;  Laterality: Right;  . NASAL SEPTUM SURGERY    . OOPHORECTOMY Right 2000  . SHOULDER ARTHROSCOPY WITH OPEN ROTATOR CUFF REPAIR AND DISTAL CLAVICLE ACROMINECTOMY Right 11/26/2013   Procedure: RIGHT SHOULDER ARTHROSCOPY WITH MINI OPEN ROTATOR CUFF REPAIR AND DISTAL CLAVICLE RESECTION, SUBACROMIAL DECOMPRESSION, POSSIBLE [facility] PATCH.;  Surgeon: [name], MD;  Location: [facility];  Service: Orthopedics;  Laterality: Right;  . TOTAL KNEE ARTHROPLASTY Right 03/21/2017  . TOTAL KNEE ARTHROPLASTY Right 03/21/2017   Procedure: RIGHT TOTAL KNEE ARTHROPLASTY;  Surgeon: [name], MD;  Location: [facility];  Service: Orthopedics;  Laterality: Right;  . TUBAL LIGATION      There were no vitals filed for this visit.  Subjective Assessment - 04/02/18 1352    Subjective  Patient reports her pain is 65% better overall. patient reports she is having pain less often, which she is happy about. Patient reports 1/10 pain when she does have pain and minimal pain this am.     Pertinent  History  Patient is a 67 year old female presenting today with L dorsum foot pain. Patient reports this pain began 3 months ago. Patient with PMH of R TKA, where she was seen here for PT, and reports she was "favoring the L side" and thinks this is where her pain began from. Patient reports over the past 2 months she has had 4 injections and is using costum orthotics, which she reports has helped with her pain 65%. Patient reports with prolonged walking she has a "little pain" if she is wearing her orthotics, and increased pain when she is not wearing her orthotics (which do not go in all of her shoes). Patient is retired, but reports she is unable to complete hobbies of hunting, kayaking, biking, and playing with dogs. Patient reports pain  "any time she is on her feet". Patient reports worst pain in the past week : 4/10 ; best : 1/10. Pt denies N/V, B&B changes, unexplained weight fluctuation, saddle paresthesia, fever, night sweats, or unrelenting night pain at this time.    Limitations  Standing;Walking;House hold activities    How long can you sit comfortably?  unlimited    How long can you stand comfortably?  w/0 orthotic 51mn    How long can you walk comfortably?  w/0 orthotic 151m    Diagnostic tests  None yet    Patient Stated Goals  Wants to go to hunting and kayaking, and walk her 5 dogs.     Pain Onset  More than a month ago              Therapeutic Exercise: -SLS ball toss on airex pad with therapist throwing ball outside pt BOS to promote balance with reaching 3x 10  - Biodex limits of postural stability for inc wt shifting as a precursor to SLS (no UE support) hard level: 82% 30sec, 69% in 32sec; 81% 29sec - Mini squat on bosu ball (hard side) 3x 10 with min cuing for full hip ext supervision - Slider semi circle standing on LLE x10 ; adding airex pad 2x 10 with CGA  - Standing on airex pad, drawingalphabet in "big letters" with UE supervision  Manual - STM withtrigger point releaseto Llateralgastroc and medial gastroc  - STM withtrigger point releasetomedial and transverse archwith "ease up" putty              PT Education - 04/02/18 1359    Education provided  Yes    Education Details  exercise form    Person(s) Educated  Patient    Methods  Explanation;Verbal cues    Comprehension  Verbalized understanding;Verbal cues required       PT Short Term Goals - 02/15/18 1648      PT SHORT TERM GOAL #1   Title  Pt will be independent with HEP in order to improve strength and balance in order to decrease fall risk and improve function at home and work    Time  4    Period  Weeks    Status  New        PT Long Term Goals - 02/15/18 1648      PT LONG TERM GOAL #1   Title   Pt will increase 10MWT by at least 0.13 m/s in order to demonstrate clinically significant improvement in community ambulation    Baseline  02/15/18: 0.8364m   Time  8    Period  Weeks    Status  New  PT LONG TERM GOAL #2   Title  Pt will decrease 5TSTS by at least 3 seconds in order to demonstrate clinically significant improvement in LE strength    Baseline  02/15/18: 8sec    Time  8    Period  Weeks    Status  New      PT LONG TERM GOAL #3   Title  Pt will decrease worst pain as reported on NPRS by at least 3 points in order to demonstrate clinically significant reduction in pain.    Baseline  02/15/18 4/10    Time  8    Period  Weeks    Status  New      PT LONG TERM GOAL #4   Title  Patient will increase FOTO score to 56 to demonstrate predicted increase in functional mobility to complete ADLs    Baseline  02/15/18 45    Time  8    Period  Weeks    Status  New            Plan - 04/02/18 1429    Clinical Impression Statement  PT continued manual techniques with decreased soft tissue tension following. PT continued to led patient through therex progression for balance and ankle stability, which patient is able to complete with accuracy following some cuing from PT with supervision to CGA for safety     Rehab Potential  Fair    Clinical Impairments Affecting Rehab Potential  (-) healing time, pain; (+) husband support, acute condition, motivated    PT Frequency  2x / week    PT Duration  6 weeks    PT Treatment/Interventions  Neuromuscular re-education;Therapeutic exercise;Therapeutic activities;Manual techniques;Aquatic Therapy;Electrical Stimulation;Iontophoresis 26m/ml Dexamethasone;Gait training;Stair training;Balance training;Functional mobility training;Patient/family education;Scar mobilization;Passive range of motion;Dry needling    PT Next Visit Plan  Manual techniques for pain relief, strengthening for postural carry over    PT Home Exercise Plan  great toe  stretch, DF stretc, bridge    Consulted and Agree with Plan of Care  Patient       Patient will benefit from skilled therapeutic intervention in order to improve the following deficits and impairments:  Pain, Difficulty walking, Decreased strength, Decreased scar mobility, Decreased range of motion  Visit Diagnosis: Muscle weakness (generalized)  Difficulty in walking, not elsewhere classified     Problem List Patient Active Problem List   Diagnosis Date Noted  . History of total right knee replacement 03/21/2017  . Chronic diarrhea 12/07/2014  . Segmental [place] 09/12/2014  . Routine general medical examination at a health care facility 06/05/2014  . Overweight 06/13/2011  . ABNORMAL CHEST XRAY 09/30/2007  . Hyperlipidemia 02/17/2007  . Essential hypertension 01/08/2007  . Osteoarthritis 01/08/2007   CShelton SilvasPT, DPT CShelton Silvas12/23/2019, 2:45 PM  [facility] [address] [place] [address]Phone: [phone]  Fax:  [phone] Name: Stacy MANTERNACHMRN: [id number] of Birth: 312-Mar-1952

## 2018-04-05 ENCOUNTER — Ambulatory Visit: Payer: Medicare Other | Admitting: Physical Therapy

## 2018-04-05 ENCOUNTER — Encounter: Payer: Self-pay | Admitting: Physical Therapy

## 2018-04-05 DIAGNOSIS — R262 Difficulty in walking, not elsewhere classified: Secondary | ICD-10-CM | POA: Diagnosis not present

## 2018-04-05 DIAGNOSIS — M25572 Pain in left ankle and joints of left foot: Secondary | ICD-10-CM | POA: Diagnosis not present

## 2018-04-05 DIAGNOSIS — M6281 Muscle weakness (generalized): Secondary | ICD-10-CM | POA: Diagnosis not present

## 2018-04-05 NOTE — Therapy (Addendum)
[facility] [address] [place], [address] Phone: [phone]   Fax:  [phone]  Physical Therapy Treatment  Patient Details  Name: [name] MRN: [id number] Date of Birth: [birth year] Referring Provider (PT): Lenon Oms MD   Encounter Date: 04/05/2018  PT End of Session - 04/05/18 1309    Visit Number  12   Number of Visits  17    Date for PT Re-Evaluation  04/12/18    PT Start Time  0100    PT Stop Time  0145    PT Time Calculation (min)  45 min    Activity Tolerance  Patient tolerated treatment well    Behavior During Therapy  [facility] for tasks assessed/performed       Past Medical History:  Diagnosis Date  . Arthritis    back, fingers with joint pain and swelling.  chronic back pain  . Chronic back pain    arthritis   . Diverticulosis   . Elevated cholesterol    takes Niacin daily and Simvastatin  . GERD (gastroesophageal reflux disease) 06/2004   non-specific gastritis on EGD 06/2004  . Headache(784.0)    occasionally  . History of colon polyps 2004, 2009   2004:adenomatous. 2009 hyperplastic.   Marland Kitchen History of hiatal hernia   . Hypertension    takes Tenoretic and Lisinopril daily  . Mucoid cyst of joint 08/2013   right index finger  . Osteopenia 01/2014   T score -1.1 FRAX 14%/0.5%. Stable from prior DEXA  . PONV (postoperative nausea and vomiting)   . Seasonal allergies   . Stroke ([place]) 1998   x 2 - mild left-sided weakness  . Urge incontinence   . Uterine prolapse     Past Surgical History:  Procedure Laterality Date  . BACK SURGERY     x 2  . BELPHAROPTOSIS REPAIR Bilateral 12/14/2017  . CATARACT EXTRACTION    . CATARACT EXTRACTION Bilateral 10/2017  . COLONOSCOPY  2004, 2009, 2014  . FINGER SURGERY    . GUM SURGERY    . GYNECOLOGIC CRYOSURGERY    . HYSTEROSCOPY W/D&C  12/07/2010   with resection of endometrial polyp  . KNEE ARTHROSCOPY Left 2008  . lip biopsy      done at MD office Fri 11/22/13  . MASS EXCISION Right 08/15/2013   Procedure: RIGHT INDEX EXCISION MASS ;  Surgeon: Tennis Must, MD;  Location: [facility];  Service: Orthopedics;  Laterality: Right;  . NASAL SEPTUM SURGERY    . OOPHORECTOMY Right 2000  . SHOULDER ARTHROSCOPY WITH OPEN ROTATOR CUFF REPAIR AND DISTAL CLAVICLE ACROMINECTOMY Right 11/26/2013   Procedure: RIGHT SHOULDER ARTHROSCOPY WITH MINI OPEN ROTATOR CUFF REPAIR AND DISTAL CLAVICLE RESECTION, SUBACROMIAL DECOMPRESSION, POSSIBLE [facility] PATCH.;  Surgeon: [name], MD;  Location: [facility];  Service: Orthopedics;  Laterality: Right;  . TOTAL KNEE ARTHROPLASTY Right 03/21/2017  . TOTAL KNEE ARTHROPLASTY Right 03/21/2017   Procedure: RIGHT TOTAL KNEE ARTHROPLASTY;  Surgeon: [name], MD;  Location: [facility];  Service: Orthopedics;  Laterality: Right;  . TUBAL LIGATION      There were no vitals filed for this visit.  Subjective Assessment - 04/05/18 1303    Subjective  Patient reports she was up cooking all day yesterday and had increased knee pain but reports no foot pain. Patient reports very minimal discomfort in the foot, reports this is not pain, only discomfort. Patient is happy with progress.  Pertinent History  Patient is a 67 year old female presenting today with L dorsum foot pain. Patient reports this pain began 3 months ago. Patient with PMH of R TKA, where she was seen here for PT, and reports she was "favoring the L side" and thinks this is where her pain began from. Patient reports over the past 2 months she has had 4 injections and is using costum orthotics, which she reports has helped with her pain 65%. Patient reports with prolonged walking she has a "little pain" if she is wearing her orthotics, and increased pain when she is not wearing her orthotics (which do not go in all of her shoes). Patient is retired, but reports she is unable to complete hobbies of hunting, kayaking, biking, and  playing with dogs. Patient reports pain "any time she is on her feet". Patient reports worst pain in the past week : 4/10 ; best : 1/10. Pt denies N/V, B&B changes, unexplained weight fluctuation, saddle paresthesia, fever, night sweats, or unrelenting night pain at this time.    Limitations  Standing;Walking;House hold activities    How long can you sit comfortably?  unlimited    How long can you stand comfortably?  w/0 orthotic 50mn    How long can you walk comfortably?  w/0 orthotic 166m    Diagnostic tests  None yet    Patient Stated Goals  Wants to go to hunting and kayaking, and walk her 5 dogs.     Pain Onset  More than a month ago       Therapeutic Exercise: -Sidestepping on [id number] pad with 6in hurdle in middle of walkway for increased ankle stability demand in SLS 3x 2/3/4 rounds (down and back =1); supervision  - Tandem walking [id number] trials with cuing for hip ext  - Tandem walking on airex pad forward and backward x3  - SLS ball toss on airex pad with therapist throwing ball outside pt BOS to promote balance with reaching 3x 12 with inc foot down to prevent LOB - SLS on airex pad with colored balance stones in semi circle and PT calling out colors for patient to tap opposite foot on at random 3x 6 taps each LE   - Biodex limits of postural stability for inc wt shifting as a precursor to SLS (no UE support)hard level: 82% 30sec, 69% in 32sec; 81% 29sec -Mini squat on bosu ball (hard side) 3x 8 with min cuing for full hip ext supervision   Manual - STM withtrigger point releaseto Llateralgastrocand medial gastroc  - STM withtrigger point releasetomedial and transverse archwith "ease up" putty                        PT Education - 04/05/18 1307    Education provided  Yes    Education Details  Exercise form, POC update    Person(s) Educated  Patient    Methods  Explanation;Demonstration;Tactile cues    Comprehension  Verbalized  understanding;Returned demonstration;Verbal cues required       PT Short Term Goals - 02/15/18 1648      PT SHORT TERM GOAL #1   Title  Pt will be independent with HEP in order to improve strength and balance in order to decrease fall risk and improve function at home and work    Time  4    Period  Weeks    Status  New        PT Long  Term Goals - 02/15/18 1648      PT LONG TERM GOAL #1   Title  Pt will increase 10MWT by at least 0.13 m/s in order to demonstrate clinically significant improvement in community ambulation    Baseline  02/15/18: 0.68ms    Time  8    Period  Weeks    Status  New      PT LONG TERM GOAL #2   Title  Pt will decrease 5TSTS by at least 3 seconds in order to demonstrate clinically significant improvement in LE strength    Baseline  02/15/18: 8sec    Time  8    Period  Weeks    Status  New      PT LONG TERM GOAL #3   Title  Pt will decrease worst pain as reported on NPRS by at least 3 points in order to demonstrate clinically significant reduction in pain.    Baseline  02/15/18 4/10    Time  8    Period  Weeks    Status  New      PT LONG TERM GOAL #4   Title  Patient will increase FOTO score to 56 to demonstrate predicted increase in functional mobility to complete ADLs    Baseline  02/15/18 45    Time  8    Period  Weeks    Status  New            Plan - 04/05/18 1319    Clinical Impression Statement  PT utilized less manual time d/t less tension noted today. PT continued balance progression for abnkle stability which patient is able to complete with no inc pain, only noted muscle fatigue. PT and patient discussed possible DC next visit after reviewing to ensure goals are met, should progress continue; pt verbalized understanding.     Clinical Presentation  Evolving    Clinical Decision Making  Moderate    Rehab Potential  Fair    Clinical Impairments Affecting Rehab Potential  (-) healing time, pain; (+) husband support, acute condition,  motivated    PT Frequency  2x / week    PT Treatment/Interventions  Neuromuscular re-education;Therapeutic exercise;Therapeutic activities;Manual techniques;Aquatic Therapy;Electrical Stimulation;Iontophoresis 445mml Dexamethasone;Gait training;Stair training;Balance training;Functional mobility training;Patient/family education;Scar mobilization;Passive range of motion;Dry needling    PT Next Visit Plan  Manual techniques for pain relief, strengthening for postural carry over    PT Home Exercise Plan  great toe stretch, DF stretc, bridge    Consulted and Agree with Plan of Care  Patient       Patient will benefit from skilled therapeutic intervention in order to improve the following deficits and impairments:  Pain, Difficulty walking, Decreased strength, Decreased scar mobility, Decreased range of motion  Visit Diagnosis: Muscle weakness (generalized)     Problem List Patient Active Problem List   Diagnosis Date Noted  . History of total right knee replacement 03/21/2017  . Chronic diarrhea 12/07/2014  . Seg[place]  . Routine general medical examination at a health care facility 06/05/2014  . Overweight 06/13/2011  . ABNORMAL CHEST XRAY 09/30/2007  . Hyperlipidemia 02/17/2007  . Essential hypertension 01/08/2007  . Osteoarthritis 01/08/2007   ChShelton SilvasT, DPT ChShelton Silvas2/26/2019, 2:25 PM  [place] [facility] [address]hone: [phone] Fax:  [phone]: Stacy NUSSBAUMERRN: [id number] of Birth: 06/1950-05-13

## 2018-04-10 ENCOUNTER — Encounter: Payer: Self-pay | Admitting: Physical Therapy

## 2018-04-10 ENCOUNTER — Ambulatory Visit: Payer: Medicare Other | Admitting: Physical Therapy

## 2018-04-10 DIAGNOSIS — M6281 Muscle weakness (generalized): Secondary | ICD-10-CM | POA: Diagnosis not present

## 2018-04-10 DIAGNOSIS — M25572 Pain in left ankle and joints of left foot: Secondary | ICD-10-CM | POA: Diagnosis not present

## 2018-04-10 DIAGNOSIS — R262 Difficulty in walking, not elsewhere classified: Secondary | ICD-10-CM | POA: Diagnosis not present

## 2018-04-10 NOTE — Therapy (Addendum)
[facility] [address] [place], [address] Phone: [phone]   Fax:  [phone]  Physical Therapy Treatment/DC Summary Dates of reporting period 02/15/2018 to 04/10/2018  Patient Details  Name: [name] MRN: [id number] Date of Birth: 06-Aug-1950 Referring Provider (PT): Lenon Oms MD   Encounter Date: 04/10/2018  PT End of Session - 04/10/18 1332    Visit Number  13   Number of Visits  17    Date for PT Re-Evaluation  04/12/18    PT Start Time  0100    PT Stop Time  0140    PT Time Calculation (min)  40 min    Activity Tolerance  Patient tolerated treatment well    Behavior During Therapy  [facility] for tasks assessed/performed       Past Medical History:  Diagnosis Date  . Arthritis    back, fingers with joint pain and swelling.  chronic back pain  . Chronic back pain    arthritis   . Diverticulosis   . Elevated cholesterol    takes Niacin daily and Simvastatin  . GERD (gastroesophageal reflux disease) 06/2004   non-specific gastritis on EGD 06/2004  . Headache(784.0)    occasionally  . History of colon polyps 2004, 2009   2004:adenomatous. 2009 hyperplastic.   Marland Kitchen History of hiatal hernia   . Hypertension    takes Tenoretic and Lisinopril daily  . Mucoid cyst of joint 08/2013   right index finger  . Osteopenia 01/2014   T score -1.1 FRAX 14%/0.5%. Stable from prior DEXA  . PONV (postoperative nausea and vomiting)   . Seasonal allergies   . Stroke ([place]) 1998   x 2 - mild left-sided weakness  . Urge incontinence   . Uterine prolapse     Past Surgical History:  Procedure Laterality Date  . BACK SURGERY     x 2  . BELPHAROPTOSIS REPAIR Bilateral 12/14/2017  . CATARACT EXTRACTION    . CATARACT EXTRACTION Bilateral 10/2017  . COLONOSCOPY  2004, 2009, 2014  . FINGER SURGERY    . GUM SURGERY    . GYNECOLOGIC CRYOSURGERY    . HYSTEROSCOPY W/D&C  12/07/2010   with resection of  endometrial polyp  . KNEE ARTHROSCOPY Left 2008  . lip biopsy     done at MD office Fri 11/22/13  . MASS EXCISION Right 08/15/2013   Procedure: RIGHT INDEX EXCISION MASS ;  Surgeon: Tennis Must, MD;  Location: [place];  Service: Orthopedics;  Laterality: Right;  . NASAL SEPTUM SURGERY    . OOPHORECTOMY Right 2000  . SHOULDER ARTHROSCOPY WITH OPEN ROTATOR CUFF REPAIR AND DISTAL CLAVICLE ACROMINECTOMY Right 11/26/2013   Procedure: RIGHT SHOULDER ARTHROSCOPY WITH MINI OPEN ROTATOR CUFF REPAIR AND DISTAL CLAVICLE RESECTION, SUBACROMIAL DECOMPRESSION, POSSIBLE [facility] PATCH.;  Surgeon: [name], MD;  Location: [place];  Service: Orthopedics;  Laterality: Right;  . TOTAL KNEE ARTHROPLASTY Right 03/21/2017  . TOTAL KNEE ARTHROPLASTY Right 03/21/2017   Procedure: RIGHT TOTAL KNEE ARTHROPLASTY;  Surgeon: [name], MD;  Location: [place];  Service: Orthopedics;  Laterality: Right;  . TUBAL LIGATION      There were no vitals filed for this visit.  Subjective Assessment - 04/10/18 1305    Subjective  Patient reports minimal pain in the arch of her foot, reports occasional, manageable pain. patient reports she feels like she is ready to DC to massage therapy 1x/month, but would like to  go over HEP.     Pertinent History  Patient is a 67 year old female presenting today with L dorsum foot pain. Patient reports this pain began 3 months ago. Patient with PMH of R TKA, where she was seen here for PT, and reports she was "favoring the L side" and thinks this is where her pain began from. Patient reports over the past 2 months she has had 4 injections and is using costum orthotics, which she reports has helped with her pain 65%. Patient reports with prolonged walking she has a "little pain" if she is wearing her orthotics, and increased pain when she is not wearing her orthotics (which do not go in all of her shoes). Patient is retired, but reports she is unable to complete hobbies  of hunting, kayaking, biking, and playing with dogs. Patient reports pain "any time she is on her feet". Patient reports worst pain in the past week : 4/10 ; best : 1/10. Pt denies N/V, B&B changes, unexplained weight fluctuation, saddle paresthesia, fever, night sweats, or unrelenting night pain at this time.    Limitations  Standing;Walking;House hold activities    How long can you sit comfortably?  unlimited    How long can you stand comfortably?  w/0 orthotic 42mn    How long can you walk comfortably?  w/0 orthotic 138m    Diagnostic tests  None yet    Patient Stated Goals  Wants to go to hunting and kayaking, and walk her 5 dogs.     Pain Onset  More than a month ago      Manual - STM withtrigger point releaseto Llateralgastrocand medial gastroc  - STM withtrigger point releasetomedial and transverse archwith "ease up" putty  Ther-Ex Review of the following wit 2 sets of all therex and only min cuing for correction. Education on stretching vs. Strengthening parameters with info on ACSM guidelines for each. Patient verbalized understanding  Exercises  Gastroc Stretch on Wall - 10 reps - 3 sets - 1x daily - 7x weekly  Seated Self Great Toe Stretch - 10 reps - 3 sets - 1x daily - 7x weekly  Single Leg Stance on Pillow - 10 reps - 3 sets - 1x daily - 7x weekly  Supine Bridge - 10 reps - 3 sets - 1x daily - 3x weekly  Mini Squat with Counter Support - 10 reps - 3 sets - 1x daily - 3x weekly  Side Stepping with Resistance at Feet - 10 reps - 3 sets - 1x daily - 3x weekly  Standing Hip Extension with Resistance at Ankles and Counter Support - 10 reps - 3 sets - 1x daily - 3x weekly  Step Up - 10 reps - 3 sets - 1x daily - 3x weekly  Attempted lateral step downs, unable to complete correctly without knee pain, discontinued.                          PT Education - 04/10/18 1331    Education provided  Yes    Education Details  Exericse form; HEP and d/c  recommendations    Person(s) Educated  Patient    Methods  Explanation;Demonstration;Verbal cues;Handout    Comprehension  Verbalized understanding;Returned demonstration;Verbal cues required       PT Short Term Goals - 04/10/18 1315      PT SHORT TERM GOAL #1   Title  Pt will be independent with HEP in order to improve  strength and balance in order to decrease fall risk and improve function at home and work    Time  4    Period  Weeks    Status  Achieved        PT Long Term Goals - 04/10/18 1308      PT LONG TERM GOAL #1   Title  Pt will increase 10MWT by at least 0.13 m/s in order to demonstrate clinically significant improvement in community ambulation    Baseline  04/10/18 1.8ms     Time  8    Period  Weeks    Status  Achieved      PT LONG TERM GOAL #2   Title  Pt will decrease 5TSTS by at least 3 seconds in order to demonstrate clinically significant improvement in LE strength    Baseline  04/10/18: 13.5sec 02/15/18: 18sec    Time  8    Period  Weeks    Status  Achieved      PT LONG TERM GOAL #3   Title  Pt will decrease worst pain as reported on NPRS by at least 3 points in order to demonstrate clinically significant reduction in pain.    Baseline  04/10/18 1/10    Time  8    Period  Weeks    Status  Achieved      PT LONG TERM GOAL #4   Title  Patient will increase FOTO score to 56 to demonstrate predicted increase in functional mobility to complete ADLs    Baseline  04/10/18 68    Time  8    Period  Weeks    Status  Achieved            Plan - 04/10/18 1359    Clinical Impression Statement  Patient has met all goals at this time to safely DC PT to robust HEP for maintainence. Patient is able to complete all HEP therex with proper form following min cuing from PT. Pt verbalizes understanding of all DC recommendations (see note). Pt given clinic contact info should any further questions/concerns arise.     Rehab Potential  Fair    Clinical Impairments  Affecting Rehab Potential  (-) healing time, pain; (+) husband support, acute condition, motivated    PT Frequency  2x / week    PT Duration  6 weeks    PT Treatment/Interventions  Neuromuscular re-education;Therapeutic exercise;Therapeutic activities;Manual techniques;Aquatic Therapy;Electrical Stimulation;Iontophoresis 449mml Dexamethasone;Gait training;Stair training;Balance training;Functional mobility training;Patient/family education;Scar mobilization;Passive range of motion;Dry needling    PT Next Visit Plan  Manual techniques for pain relief, strengthening for postural carry over    PT Home Exercise Plan  great toe stretch, DF stretc, bridge    Consulted and Agree with Plan of Care  Patient       Patient will benefit from skilled therapeutic intervention in order to improve the following deficits and impairments:  Pain, Difficulty walking, Decreased strength, Decreased scar mobility, Decreased range of motion  Visit Diagnosis: Muscle weakness (generalized)     Problem List Patient Active Problem List   Diagnosis Date Noted  . History of total right knee replacement 03/21/2017  . Chronic diarrhea 12/07/2014  . Segmental colitis with rectal bleeding ([place]/06/2014  . Routine general medical examination at a health care facility 06/05/2014  . Overweight 06/13/2011  . ABNORMAL CHEST XRAY 09/30/2007  . Hyperlipidemia 02/17/2007  . Essential hypertension 01/08/2007  . Osteoarthritis 01/08/2007   ChShelton SilvasT, DPT ChShelton Silvas2/31/2019, 2:15 PM  [facility] [address] [place], [address] Phone: [phone]   Fax:  [phone]  Name: [name] MRN: [id number] Date of Birth: 1951/03/12

## 2018-04-12 ENCOUNTER — Ambulatory Visit: Payer: Medicare Other | Admitting: Physical Therapy

## 2018-04-17 ENCOUNTER — Ambulatory Visit: Payer: Medicare Other | Admitting: Physical Therapy

## 2018-04-19 ENCOUNTER — Encounter: Payer: Medicare Other | Admitting: Physical Therapy

## 2018-04-23 ENCOUNTER — Other Ambulatory Visit: Payer: Self-pay | Admitting: Internal Medicine

## 2018-06-07 DIAGNOSIS — M722 Plantar fascial fibromatosis: Secondary | ICD-10-CM | POA: Diagnosis not present

## 2018-06-07 DIAGNOSIS — M71572 Other bursitis, not elsewhere classified, left ankle and foot: Secondary | ICD-10-CM | POA: Diagnosis not present

## 2018-06-20 DIAGNOSIS — M71572 Other bursitis, not elsewhere classified, left ankle and foot: Secondary | ICD-10-CM | POA: Diagnosis not present

## 2018-06-20 DIAGNOSIS — M722 Plantar fascial fibromatosis: Secondary | ICD-10-CM | POA: Diagnosis not present

## 2018-08-06 ENCOUNTER — Encounter: Payer: Self-pay | Admitting: Internal Medicine

## 2018-08-06 ENCOUNTER — Ambulatory Visit (INDEPENDENT_AMBULATORY_CARE_PROVIDER_SITE_OTHER): Payer: Medicare Other | Admitting: Internal Medicine

## 2018-08-06 DIAGNOSIS — R300 Dysuria: Secondary | ICD-10-CM

## 2018-08-06 MED ORDER — SULFAMETHOXAZOLE-TRIMETHOPRIM 800-160 MG PO TABS: 1.0000 | ORAL_TABLET | Freq: Two times a day (BID) | ORAL | 0 refills | Status: DC

## 2018-08-06 NOTE — Assessment & Plan Note (Signed)
Sounds to be acute cystitis and will treat with bactrim. If no improvement needs U/A and culture.

## 2018-08-06 NOTE — Progress Notes (Signed)
Virtual Visit via Video Note  I connected with [name] on 08/06/18 at  3:00 PM EDT by a video enabled telemedicine application and verified that I am speaking with the correct person using two identifiers.   I discussed the limitations of evaluation and management by telemedicine and the availability of in person appointments. The patient expressed understanding and agreed to proceed.  History of Present Illness: The patient is a 68 y.o. female with visit for potential UTI. Started 4-5 days ago. She has been using more water and cranberry juice. This helped minimally. Denies much improvement. Has chronic incontinence and this is stable. Pain with urination which is burning. Denies fevers or chills. Denies SOB or cough. Denies new back pain although has chronic back pain. Has mild nausea without vomiting. Denies change in medication recently or blood in urine. Overall it is stable since onset. Has tried cranberry juice which did not help.  Observations/Objective: Appearance: normal, breathing appears normal, casual grooming, abdomen does not appear distended, throat normal, mental status is A and O times 3  Assessment and Plan: See problem oriented charting  Follow Up Instructions: rx bactrim 5 day course  I discussed the assessment and treatment plan with the patient. The patient was provided an opportunity to ask questions and all were answered. The patient agreed with the plan and demonstrated an understanding of the instructions.   The patient was advised to call back or seek an in-person evaluation if the symptoms worsen or if the condition fails to improve as anticipated.  Hoyt Koch, MD

## 2018-08-23 ENCOUNTER — Encounter: Payer: Self-pay | Admitting: Orthopaedic Surgery

## 2018-08-23 ENCOUNTER — Ambulatory Visit (INDEPENDENT_AMBULATORY_CARE_PROVIDER_SITE_OTHER): Payer: Medicare Other

## 2018-08-23 ENCOUNTER — Ambulatory Visit (INDEPENDENT_AMBULATORY_CARE_PROVIDER_SITE_OTHER): Payer: Medicare Other | Admitting: Orthopaedic Surgery

## 2018-08-23 ENCOUNTER — Other Ambulatory Visit: Payer: Self-pay

## 2018-08-23 VITALS — BP 146/82 | HR 81 | Ht 62.0 in | Wt 195.0 lb

## 2018-08-23 DIAGNOSIS — G8929 Other chronic pain: Secondary | ICD-10-CM | POA: Insufficient documentation

## 2018-08-23 DIAGNOSIS — M25512 Pain in left shoulder: Secondary | ICD-10-CM

## 2018-08-23 MED ORDER — METHYLPREDNISOLONE ACETATE 40 MG/ML IJ SUSP
80.0000 mg | INTRAMUSCULAR | Status: AC | PRN
  Administered 2018-08-23: 80 mg via INTRA_ARTICULAR

## 2018-08-23 MED ORDER — LIDOCAINE HCL 2 % IJ SOLN
2.0000 mL | INTRAMUSCULAR | Status: AC | PRN
  Administered 2018-08-23: 2 mL

## 2018-08-23 MED ORDER — BUPIVACAINE HCL 0.5 % IJ SOLN
2.0000 mL | INTRAMUSCULAR | Status: AC | PRN
  Administered 2018-08-23: 15:00:00 2 mL via INTRA_ARTICULAR

## 2018-08-23 NOTE — Progress Notes (Signed)
Office Visit Note   Patient: [name]           Date of Birth: [birth year]           MRN: [id number] Visit Date: 08/23/2018              Requested by: Hoyt Koch, MD [place], [place] [id number] PCP: Hoyt Koch, MD   Assessment & Plan: Visit Diagnoses:  1. Chronic left shoulder pain     Plan: Chronic left shoulder pain without injury or trauma over 5 to 6 months.  X-rays demonstrate superior migration of the humeral head with changes consistent with rotator cuff arthropathy.  Will order MRI scan to confirm anterior detail any degenerative changes.  We will also inject the subacromial space with cortisone for relief of her pain.  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   Orders:  Orders Placed This Encounter  Procedures  . Large Joint Inj: L subacromial bursa  . XR Shoulder Left  . MR Shoulder Left w/o contrast   No orders of the defined types were placed in this encounter.     Procedures: Large Joint Inj: L subacromial bursa on 08/23/2018 3:04 PM Indications: pain and diagnostic evaluation Details: 25 G 1.5 in needle, anterolateral approach  Arthrogram: No  Medications: 2 mL lidocaine 2 %; 2 mL bupivacaine 0.5 %; 80 mg methylPREDNISolone acetate 40 MG/ML Consent was given by the patient. Immediately prior to procedure a time out was called to verify the correct patient, procedure, equipment, support staff and site/side marked as required. Patient was prepped and draped in the usual sterile fashion.       Clinical Data: No additional findings.   Subjective: Chief Complaint  Patient presents with  . Left Shoulder - Pain  Patient presents today with left shoulder pain. She said that it started about one year ago, but has worsened over time. She said that her pain is located on the anterior side and she cannot lift anything with that arm  due to weakness. She takes tylenol if needed.   HPI  Review of Systems  Constitutional: Negative for fatigue.  HENT: Negative for ear pain.   Eyes: Negative for pain.  Respiratory: Negative for shortness of breath.   Cardiovascular: Negative for leg swelling.  Gastrointestinal: Negative for constipation and diarrhea.  Endocrine: Negative for cold intolerance and heat intolerance.  Genitourinary: Negative for difficulty urinating.  Musculoskeletal: Negative for joint swelling.  Skin: Negative for rash.  Allergic/Immunologic: Positive for food allergies.  Neurological: Positive for weakness.  Hematological: Does not bruise/bleed easily.  Psychiatric/Behavioral: Positive for sleep disturbance.     Objective: Vital Signs: BP (!) 146/82   Pulse 81   Ht 5' 2"  (1.575 m)   Wt 195 lb (88.5 kg)   LMP 02/09/2005   BMI 35.67 kg/m   Physical Exam Constitutional:      Appearance: She is well-developed.  Eyes:     Pupils: Pupils are equal, round, and reactive to light.  Pulmonary:     Effort: Pulmonary effort is normal.  Skin:    General: Skin is warm and dry.  Neurological:     Mental Status: She is alert and oriented to person, place, and time.  Psychiatric:        Behavior: Behavior normal.     Ortho Exam awake alert and oriented x3.  Comfortable sitting.  Significant  weakness trying to raise her left arm over her head.  I could place her arm overhead she can maintain that position "barely".  She could not raise it overhead actively.  Positive impingement and empty can testing.  Considerable weakness with external rotation  Specialty Comments:  No specialty comments available.  Imaging: Xr Shoulder Left  Result Date: 08/23/2018 Films of the left shoulder were obtained in several projections.  The humeral head is high riding with minimal space between the humeral head and the acromion.  There is some lateral spurring of the acromion.  Moderate degenerative changes at the  chromic clavicular joint.  No evidence of a fracture or ectopic calcification.    PMFS History: Patient Active Problem List   Diagnosis Date Noted  . Chronic left shoulder pain 08/23/2018  . Dysuria 08/06/2018  . History of total right knee replacement 03/21/2017  . Chronic diarrhea 12/07/2014  . Segmental colitis with rectal bleeding ([place]) 09/12/2014  . Routine general medical examination at a health care facility 06/05/2014  . Overweight 06/13/2011  . ABNORMAL CHEST XRAY 09/30/2007  . Hyperlipidemia 02/17/2007  . Essential hypertension 01/08/2007  . Osteoarthritis 01/08/2007   Past Medical History:  Diagnosis Date  . Arthritis    back, fingers with joint pain and swelling.  chronic back pain  . Chronic back pain    arthritis   . Diverticulosis   . Elevated cholesterol    takes Niacin daily and Simvastatin  . GERD (gastroesophageal reflux disease) 06/2004   non-specific gastritis on EGD 06/2004  . Headache(784.0)    occasionally  . History of colon polyps 2004, 2009   2004:adenomatous. 2009 hyperplastic.   Marland Kitchen History of hiatal hernia   . Hypertension    takes Tenoretic and Lisinopril daily  . Mucoid cyst of joint 08/2013   right index finger  . Osteopenia 01/2014   T score -1.1 FRAX 14%/0.5%. Stable from prior DEXA  . PONV (postoperative nausea and vomiting)   . Seasonal allergies   . Stroke ([place]) 1998   x 2 - mild left-sided weakness  . Urge incontinence   . Uterine prolapse     Family History  Problem Relation Age of Onset  . Hypertension Mother   . Heart disease Mother   . Diabetes Father   . Hypertension Father   . Heart disease Father   . Stroke Father   . Diabetes Maternal Aunt   . Diabetes Paternal Grandmother   . Hypertension Paternal Grandfather   . Stroke Paternal Grandfather     Past Surgical History:  Procedure Laterality Date  . BACK SURGERY     x 2  . BELPHAROPTOSIS REPAIR Bilateral 12/14/2017  . CATARACT EXTRACTION    . CATARACT  EXTRACTION Bilateral 10/2017  . COLONOSCOPY  2004, 2009, 2014  . FINGER SURGERY    . GUM SURGERY    . GYNECOLOGIC CRYOSURGERY    . HYSTEROSCOPY W/D&C  12/07/2010   with resection of endometrial polyp  . KNEE ARTHROSCOPY Left 2008  . lip biopsy     done at MD office Fri 11/22/13  . MASS EXCISION Right 08/15/2013   Procedure: RIGHT INDEX EXCISION MASS ;  Surgeon: Tennis Must, MD;  Location: [place];  Service: Orthopedics;  Laterality: Right;  . NASAL SEPTUM SURGERY    . OOPHORECTOMY Right 2000  . SHOULDER ARTHROSCOPY WITH OPEN ROTATOR CUFF REPAIR AND DISTAL CLAVICLE ACROMINECTOMY Right 11/26/2013   Procedure: RIGHT SHOULDER ARTHROSCOPY WITH MINI OPEN ROTATOR CUFF  REPAIR AND DISTAL CLAVICLE RESECTION, SUBACROMIAL DECOMPRESSION, POSSIBLE [facility] PATCH.;  Surgeon: [name], MD;  Location: [facility];  Service: Orthopedics;  Laterality: Right;  . TOTAL KNEE ARTHROPLASTY Right 03/21/2017  . TOTAL KNEE ARTHROPLASTY Right 03/21/2017   Procedure: RIGHT TOTAL KNEE ARTHROPLASTY;  Surgeon: [name], MD;  Location: [place];  Service: Orthopedics;  Laterality: Right;  . TUBAL LIGATION     Social History   Occupational History  . Occupation: Animal nutritionist    Comment: Disability/Retired  Tobacco Use  . Smoking status: Passive Smoke Exposure - Never Smoker  . Smokeless tobacco: Never Used  Substance and Sexual Activity  . Alcohol use: No    Alcohol/week: 0.0 standard drinks    Frequency: Never  . Drug use: No  . Sexual activity: Yes    Partners: Male    Birth control/protection: Surgical, Post-menopausal    Comment: BTL-1st intercourse 68 yo-More than 5 partners

## 2018-08-28 ENCOUNTER — Telehealth: Payer: Self-pay | Admitting: Orthopaedic Surgery

## 2018-08-28 NOTE — Telephone Encounter (Signed)
Patient called checking on the status of her MRI.  Patient requested a return call.

## 2018-09-06 ENCOUNTER — Other Ambulatory Visit: Payer: Self-pay

## 2018-09-06 ENCOUNTER — Ambulatory Visit: Payer: Medicare Other | Admitting: Orthopaedic Surgery

## 2018-09-06 ENCOUNTER — Ambulatory Visit
Admission: RE | Admit: 2018-09-06 | Discharge: 2018-09-06 | Disposition: A | Discharge status: Home / Self Care | Payer: Medicare Other | Source: Ambulatory Visit | Attending: Orthopaedic Surgery | Admitting: Orthopaedic Surgery

## 2018-09-06 DIAGNOSIS — S46012A Strain of muscle(s) and tendon(s) of the rotator cuff of left shoulder, initial encounter: Secondary | ICD-10-CM | POA: Diagnosis not present

## 2018-09-06 DIAGNOSIS — S46112A Strain of muscle, fascia and tendon of long head of biceps, left arm, initial encounter: Secondary | ICD-10-CM | POA: Diagnosis not present

## 2018-09-06 DIAGNOSIS — M7552 Bursitis of left shoulder: Secondary | ICD-10-CM | POA: Diagnosis not present

## 2018-09-06 DIAGNOSIS — G8929 Other chronic pain: Secondary | ICD-10-CM

## 2018-09-06 DIAGNOSIS — M25512 Pain in left shoulder: Secondary | ICD-10-CM

## 2018-09-10 ENCOUNTER — Ambulatory Visit (INDEPENDENT_AMBULATORY_CARE_PROVIDER_SITE_OTHER): Payer: Medicare Other | Admitting: Orthopaedic Surgery

## 2018-09-10 ENCOUNTER — Other Ambulatory Visit: Payer: Self-pay

## 2018-09-10 ENCOUNTER — Encounter: Payer: Self-pay | Admitting: Orthopaedic Surgery

## 2018-09-10 VITALS — BP 117/77 | HR 76 | Ht 62.0 in | Wt 195.0 lb

## 2018-09-10 DIAGNOSIS — G8929 Other chronic pain: Secondary | ICD-10-CM

## 2018-09-10 DIAGNOSIS — M25512 Pain in left shoulder: Secondary | ICD-10-CM | POA: Diagnosis not present

## 2018-09-10 NOTE — Progress Notes (Signed)
Office Visit Note   Patient: [name]           Date of Birth: 01/11/1951           MRN: [id number] Visit Date: 09/10/2018              Requested by: Hoyt Koch, MD [place], [place] [id number] PCP: Hoyt Koch, MD   Assessment & Plan: Visit Diagnoses:  1. Chronic left shoulder pain     Plan: MRI scan reveals completely torn infra and supraspinatus tendons with retraction to the top of the humeral head i.e. approximately 4 to 5 cm. there is severe appearing subscapularis tendinopathy. no full-thickness tear or tendon retraction of the subscapularis.  Cartilage is thin throughout the glenohumeral joint. type II acromion.  A bulky degenerative change at the acromioclavicular joint is demonstrated. Long discussion with Mrs. Sloan regarding her shoulder and different treatment options.  I think based on the fact that she has had trouble for a long time it difficult even with the right shoulder that she be a good candidate for a reverse shoulder arthroplasty.  I would like Dr. Marlou Sa to evaluate.  Follow-Up Instructions: No follow-ups on file.   Orders:  No orders of the defined types were placed in this encounter.  No orders of the defined types were placed in this encounter.     Procedures: No procedures performed   Clinical Data: No additional findings.   Subjective: Chief Complaint  Patient presents with  . Left Shoulder - Pain  Patient presents today for a follow up on her left shoulder. She had an MRI on 09/06/18 and is here today for those results. She had a cortisone injection on 08/23/2018.  Patient states that her shoulder has improved some with the injection, rest, and tylenol.  HPI  Review of Systems   Objective: Vital Signs: BP 117/77   Pulse 76   Ht 5' 2"  (1.575 m)   Wt 195 lb (88.5 kg)   LMP 02/09/2005   BMI 35.67 kg/m   Physical Exam Constitutional:      Appearance: She is well-developed.  Eyes:   Pupils: Pupils are equal, round, and reactive to light.  Pulmonary:     Effort: Pulmonary effort is normal.  Skin:    General: Skin is warm and dry.  Neurological:     Mental Status: She is alert and oriented to person, place, and time.  Psychiatric:        Behavior: Behavior normal.     Ortho Exam x3.  Comfortable sitting.  Unable to lift her left arm actively above about 50 degrees.  He has a very circuitous arc of motion passively but painful enough that I did not go beyond 90 degrees.  I suspect I could take it all the way to 180 degrees.  Considerable weakness with external rotation.  Large arms but appears the biceps is in a lower position.  Good grip and release.  Neurologically intact  Specialty Comments:  No specialty comments available.  Imaging: No results found.   PMFS History: Patient Active Problem List   Diagnosis Date Noted  . Chronic left shoulder pain 08/23/2018  . Dysuria 08/06/2018  . History of total right knee replacement 03/21/2017  . Chronic diarrhea 12/07/2014  . Segmental colitis with rectal bleeding ([place]) 09/12/2014  . Routine general medical examination at a health care facility 06/05/2014  . Overweight 06/13/2011  . ABNORMAL CHEST XRAY 09/30/2007  . Hyperlipidemia  02/17/2007  . Essential hypertension 01/08/2007  . Osteoarthritis 01/08/2007   Past Medical History:  Diagnosis Date  . Arthritis    back, fingers with joint pain and swelling.  chronic back pain  . Chronic back pain    arthritis   . Diverticulosis   . Elevated cholesterol    takes Niacin daily and Simvastatin  . GERD (gastroesophageal reflux disease) 06/2004   non-specific gastritis on EGD 06/2004  . Headache(784.0)    occasionally  . History of colon polyps 2004, 2009   2004:adenomatous. 2009 hyperplastic.   Marland Kitchen History of hiatal hernia   . Hypertension    takes Tenoretic and Lisinopril daily  . Mucoid cyst of joint 08/2013   right index finger  . Osteopenia 01/2014   T  score -1.1 FRAX 14%/0.5%. Stable from prior DEXA  . PONV (postoperative nausea and vomiting)   . Seasonal allergies   . Stroke ([place]) 1998   x 2 - mild left-sided weakness  . Urge incontinence   . Uterine prolapse     Family History  Problem Relation Age of Onset  . Hypertension Mother   . Heart disease Mother   . Diabetes Father   . Hypertension Father   . Heart disease Father   . Stroke Father   . Diabetes Maternal Aunt   . Diabetes Paternal Grandmother   . Hypertension Paternal Grandfather   . Stroke Paternal Grandfather     Past Surgical History:  Procedure Laterality Date  . BACK SURGERY     x 2  . BELPHAROPTOSIS REPAIR Bilateral 12/14/2017  . CATARACT EXTRACTION    . CATARACT EXTRACTION Bilateral 10/2017  . COLONOSCOPY  2004, 2009, 2014  . FINGER SURGERY    . GUM SURGERY    . GYNECOLOGIC CRYOSURGERY    . HYSTEROSCOPY W/D&C  12/07/2010   with resection of endometrial polyp  . KNEE ARTHROSCOPY Left 2008  . lip biopsy     done at MD office Fri 11/22/13  . MASS EXCISION Right 08/15/2013   Procedure: RIGHT INDEX EXCISION MASS ;  Surgeon: Tennis Must, MD;  Location: [facility];  Service: Orthopedics;  Laterality: Right;  . NASAL SEPTUM SURGERY    . OOPHORECTOMY Right 2000  . SHOULDER ARTHROSCOPY WITH OPEN ROTATOR CUFF REPAIR AND DISTAL CLAVICLE ACROMINECTOMY Right 11/26/2013   Procedure: RIGHT SHOULDER ARTHROSCOPY WITH MINI OPEN ROTATOR CUFF REPAIR AND DISTAL CLAVICLE RESECTION, SUBACROMIAL DECOMPRESSION, POSSIBLE [facility] PATCH.;  Surgeon: [name], MD;  Location: [facility];  Service: Orthopedics;  Laterality: Right;  . TOTAL KNEE ARTHROPLASTY Right 03/21/2017  . TOTAL KNEE ARTHROPLASTY Right 03/21/2017   Procedure: RIGHT TOTAL KNEE ARTHROPLASTY;  Surgeon: [name], MD;  Location: [facility];  Service: Orthopedics;  Laterality: Right;  . TUBAL LIGATION     Social History   Occupational History  . Occupation: Animal nutritionist     Comment: Disability/Retired  Tobacco Use  . Smoking status: Passive Smoke Exposure - Never Smoker  . Smokeless tobacco: Never Used  Substance and Sexual Activity  . Alcohol use: No    Alcohol/week: 0.0 standard drinks    Frequency: Never  . Drug use: No  . Sexual activity: Yes    Partners: Male    Birth control/protection: Surgical, Post-menopausal    Comment: BTL-1st intercourse 68 yo-More than 5 partners

## 2018-09-11 ENCOUNTER — Ambulatory Visit: Payer: Medicare Other | Admitting: Orthopaedic Surgery

## 2018-09-13 ENCOUNTER — Ambulatory Visit: Payer: Medicare Other | Admitting: Orthopaedic Surgery

## 2018-09-14 ENCOUNTER — Other Ambulatory Visit: Payer: Self-pay

## 2018-09-14 ENCOUNTER — Encounter: Payer: Self-pay | Admitting: Orthopedic Surgery

## 2018-09-14 ENCOUNTER — Ambulatory Visit (INDEPENDENT_AMBULATORY_CARE_PROVIDER_SITE_OTHER): Payer: Medicare Other | Admitting: Orthopedic Surgery

## 2018-09-14 DIAGNOSIS — G8929 Other chronic pain: Secondary | ICD-10-CM

## 2018-09-14 DIAGNOSIS — M12812 Other specific arthropathies, not elsewhere classified, left shoulder: Secondary | ICD-10-CM

## 2018-09-14 DIAGNOSIS — M25512 Pain in left shoulder: Secondary | ICD-10-CM | POA: Diagnosis not present

## 2018-09-18 ENCOUNTER — Encounter: Payer: Self-pay | Admitting: Orthopedic Surgery

## 2018-09-18 NOTE — Progress Notes (Signed)
Office Visit Note   Patient: [name]           Date of Birth: November 01, 1950           MRN: [id number] Visit Date: 09/14/2018 Requested by: Hoyt Koch, MD [place], [place] [id number] PCP: Hoyt Koch, MD  Subjective: Chief Complaint  Patient presents with  . Left Shoulder - Pain    HPI: [name] is a patient with bilateral shoulder pain left worse than right.  Pain is been going on for years.  She reports decreased range of motion and decreased functional abilities with that left arm.  She had rotator cuff repair on the right-hand side in 2015 and she is done well with that.  She also did well with right total knee replacement 2 years ago.  She has had weakness in that left shoulder for a year worse over the last 6 weeks.  She did have an injection 3 weeks ago.  This was then to her subacromial space.              ROS: All systems reviewed are negative as they relate to the chief complaint within the history of present illness.  Patient denies  fevers or chills.   Assessment & Plan: Visit Diagnoses:  1. Chronic left shoulder pain   2. Rotator cuff arthropathy of left shoulder     Plan: Impression is left shoulder rotator cuff arthropathy with functional limitation of no forward flexion abduction close to 90 degrees.  Plan is thin cut CT with reverse shoulder replacement likely to follow.  We need to get her at least a month out from her injection before considering operative intervention.  Risk and benefits are discussed.  Include but not limited to infection nerve vessel damage incomplete pain relief and incomplete restoration of function.  Patient understands and wishes to proceed.  Plan thing get CT for preop planning and will see her back after that study.  Follow-Up Instructions: No follow-ups on file.   Orders:  Orders Placed This Encounter  Procedures  . CT SHOULDER LEFT WO CONTRAST   No orders of the defined types were placed in this  encounter.     Procedures: No procedures performed   Clinical Data: No additional findings.  Objective: Vital Signs: LMP 02/09/2005   Physical Exam:   Constitutional: Patient appears well-developed HEENT:  Head: Normocephalic Eyes:EOM are normal Neck: Normal range of motion Cardiovascular: Normal rate Pulmonary/chest: Effort normal Neurologic: Patient is alert Skin: Skin is warm Psychiatric: Patient has normal mood and affect   Ortho Exam: Ortho exam demonstrates forward flexion abduction both to about 45 to 50 degrees.  Rotator cuff weakness is present on that left-hand side to infraspinatus and supraspinatus testing.  Subscap strength feels fairly reasonable.  Passively I can easily get her above 90 degrees of forward flexion abduction.  Deltoid is functional and fires.  No other masses lymphadenopathy or skin changes noted in that shoulder girdle region  Specialty Comments:  No specialty comments available.  Imaging: No results found.   PMFS History: Patient Active Problem List   Diagnosis Date Noted  . Chronic left shoulder pain 08/23/2018  . Dysuria 08/06/2018  . History of total right knee replacement 03/21/2017  . Chronic diarrhea 12/07/2014  . Segmental colitis with rectal bleeding ([place]) 09/12/2014  . Routine general medical examination at a health care facility 06/05/2014  . Overweight 06/13/2011  . ABNORMAL CHEST XRAY 09/30/2007  . Hyperlipidemia 02/17/2007  .  Essential hypertension 01/08/2007  . Osteoarthritis 01/08/2007   Past Medical History:  Diagnosis Date  . Arthritis    back, fingers with joint pain and swelling.  chronic back pain  . Chronic back pain    arthritis   . Diverticulosis   . Elevated cholesterol    takes Niacin daily and Simvastatin  . GERD (gastroesophageal reflux disease) 06/2004   non-specific gastritis on EGD 06/2004  . Headache(784.0)    occasionally  . History of colon polyps 2004, 2009   2004:adenomatous. 2009  hyperplastic.   Marland Kitchen History of hiatal hernia   . Hypertension    takes Tenoretic and Lisinopril daily  . Mucoid cyst of joint 08/2013   right index finger  . Osteopenia 01/2014   T score -1.1 FRAX 14%/0.5%. Stable from prior DEXA  . PONV (postoperative nausea and vomiting)   . Seasonal allergies   . Stroke ([place]) 1998   x 2 - mild left-sided weakness  . Urge incontinence   . Uterine prolapse     Family History  Problem Relation Age of Onset  . Hypertension Mother   . Heart disease Mother   . Diabetes Father   . Hypertension Father   . Heart disease Father   . Stroke Father   . Diabetes Maternal Aunt   . Diabetes Paternal Grandmother   . Hypertension Paternal Grandfather   . Stroke Paternal Grandfather     Past Surgical History:  Procedure Laterality Date  . BACK SURGERY     x 2  . BELPHAROPTOSIS REPAIR Bilateral 12/14/2017  . CATARACT EXTRACTION    . CATARACT EXTRACTION Bilateral 10/2017  . COLONOSCOPY  2004, 2009, 2014  . FINGER SURGERY    . GUM SURGERY    . GYNECOLOGIC CRYOSURGERY    . HYSTEROSCOPY W/D&C  12/07/2010   with resection of endometrial polyp  . KNEE ARTHROSCOPY Left 2008  . lip biopsy     done at MD office Fri 11/22/13  . MASS EXCISION Right 08/15/2013   Procedure: RIGHT INDEX EXCISION MASS ;  Surgeon: Tennis Must, MD;  Location: [facility];  Service: Orthopedics;  Laterality: Right;  . NASAL SEPTUM SURGERY    . OOPHORECTOMY Right 2000  . SHOULDER ARTHROSCOPY WITH OPEN ROTATOR CUFF REPAIR AND DISTAL CLAVICLE ACROMINECTOMY Right 11/26/2013   Procedure: RIGHT SHOULDER ARTHROSCOPY WITH MINI OPEN ROTATOR CUFF REPAIR AND DISTAL CLAVICLE RESECTION, SUBACROMIAL DECOMPRESSION, POSSIBLE [facility] PATCH.;  Surgeon: [name], MD;  Location: [facility];  Service: Orthopedics;  Laterality: Right;  . TOTAL KNEE ARTHROPLASTY Right 03/21/2017  . TOTAL KNEE ARTHROPLASTY Right 03/21/2017   Procedure: RIGHT TOTAL KNEE ARTHROPLASTY;  Surgeon: [name], MD;  Location: [facility];  Service: Orthopedics;  Laterality: Right;  . TUBAL LIGATION     Social History   Occupational History  . Occupation: Animal nutritionist    Comment: Disability/Retired  Tobacco Use  . Smoking status: Passive Smoke Exposure - Never Smoker  . Smokeless tobacco: Never Used  Substance and Sexual Activity  . Alcohol use: No    Alcohol/week: 0.0 standard drinks    Frequency: Never  . Drug use: No  . Sexual activity: Yes    Partners: Male    Birth control/protection: Surgical, Post-menopausal    Comment: BTL-1st intercourse 68 yo-More than 5 partners

## 2018-09-24 ENCOUNTER — Other Ambulatory Visit: Payer: Self-pay

## 2018-09-24 ENCOUNTER — Ambulatory Visit
Admission: RE | Admit: 2018-09-24 | Discharge: 2018-09-24 | Disposition: A | Discharge status: Home / Self Care | Payer: Medicare Other | Source: Ambulatory Visit | Attending: Orthopedic Surgery | Admitting: Orthopedic Surgery

## 2018-09-24 ENCOUNTER — Telehealth: Payer: Self-pay | Admitting: *Deleted

## 2018-09-24 DIAGNOSIS — M25412 Effusion, left shoulder: Secondary | ICD-10-CM | POA: Diagnosis not present

## 2018-09-24 DIAGNOSIS — M19012 Primary osteoarthritis, left shoulder: Secondary | ICD-10-CM | POA: Diagnosis not present

## 2018-09-24 DIAGNOSIS — M12812 Other specific arthropathies, not elsewhere classified, left shoulder: Secondary | ICD-10-CM

## 2018-09-24 NOTE — Telephone Encounter (Signed)
Received call from [facility] Radiology stating the following: Indeterminate small left upper lobe pulmonary nodule with mean diameter 6 mm. If the patient is at high risk for bronchogenic carcinoma, follow-up chest CT at 6-12 months is recommended. If the patient is at low risk for bronchogenic carcinoma, follow-up chest CT at 12 months is recommended.

## 2018-09-24 NOTE — Telephone Encounter (Signed)
pls advise. Thanks.

## 2018-09-25 NOTE — Telephone Encounter (Signed)
See note from patients PCP

## 2018-09-25 NOTE — Telephone Encounter (Signed)
Advise patient that she needs CT chest 12 months. We can discuss further in the office or virtually if she desires.

## 2018-09-25 NOTE — Telephone Encounter (Signed)
Please forward to patients primary care doc also and can you send this back to me so we can discuss with her 6/17 thx

## 2018-09-25 NOTE — Telephone Encounter (Signed)
Hey Dr Sharlet Salina,  Dr Marlou Sa wanted me to make sure that you were aware of the results that were found on patients most recent scan of her shoulder that was ordered in preparation for patient to have surgery.  He will see patient in the office tomorrow to review shoulder scan.

## 2018-09-26 ENCOUNTER — Ambulatory Visit (INDEPENDENT_AMBULATORY_CARE_PROVIDER_SITE_OTHER): Payer: Medicare Other | Admitting: Orthopedic Surgery

## 2018-09-26 ENCOUNTER — Other Ambulatory Visit: Payer: Self-pay

## 2018-09-26 ENCOUNTER — Encounter: Payer: Self-pay | Admitting: Orthopedic Surgery

## 2018-09-26 DIAGNOSIS — R918 Other nonspecific abnormal finding of lung field: Secondary | ICD-10-CM

## 2018-09-26 DIAGNOSIS — M12812 Other specific arthropathies, not elsewhere classified, left shoulder: Secondary | ICD-10-CM

## 2018-09-26 DIAGNOSIS — R911 Solitary pulmonary nodule: Secondary | ICD-10-CM | POA: Diagnosis not present

## 2018-09-26 NOTE — Progress Notes (Signed)
Office Visit Note   Patient: [name]           Date of Birth: Jun 04, 1950           MRN: [id number] Visit Date: 09/26/2018 Requested by: Hoyt Koch, MD [place],  [place] [id number] PCP: Hoyt Koch, MD  Subjective: Chief Complaint  Patient presents with  . Follow-up    HPI: [name] is a 68 year old female with left shoulder pain.  She has some right shoulder issues also with the left shoulder is bothering her more.  Since have seen her she is had a CT scan as part of preop planning for reverse shoulder replacement.  She has irreparable supraspinatus and infraspinatus rotator cuff tears.  She describes continued pain and loss of function in that left arm which is putting an unoriented amount of work on the right arm which is now starting to give her trouble.              ROS: All systems reviewed are negative as they relate to the chief complaint within the history of present illness.  Patient denies  fevers or chills.   Assessment & Plan: Visit Diagnoses:  1. Lung nodule   2. Abnormal findings on diagnostic imaging of lung   3. Rotator cuff arthropathy of left shoulder     Plan: Impression is left shoulder rotator cuff arthropathy plan left reverse shoulder replacement.  Risk benefits are discussed with the patient including but limited to infection nerve vessel damage dislocation implant longevity as well as incomplete restoration of painless full function.  Patient understands risk benefits and wishes to proceed.  All questions answered.  Follow-Up Instructions: No follow-ups on file.   Orders:  Orders Placed This Encounter  Procedures  . CT CHEST NODULE FOLLOW UP LOW DOSE W/O   No orders of the defined types were placed in this encounter.     Procedures: No procedures performed   Clinical Data: No additional findings.  Objective: Vital Signs: LMP 02/09/2005   Physical Exam:   Constitutional: Patient appears  well-developed HEENT:  Head: Normocephalic Eyes:EOM are normal Neck: Normal range of motion Cardiovascular: Normal rate Pulmonary/chest: Effort normal Neurologic: Patient is alert Skin: Skin is warm Psychiatric: Patient has normal mood and affect    Ortho Exam: Ortho exam demonstrates full active and passive range of motion of the cervical spine.  Left shoulder has forward flexion and abduction both below 90 degrees.  Patient does have weakness to infraspinatus and supraspinatus testing but the subscap is intact.  Patient has palpable radial pulse and functional deltoid.  There is coarse grinding and crepitus with active and passive range of motion of that left shoulder.  Specialty Comments:  No specialty comments available.  Imaging: No results found.   PMFS History: Patient Active Problem List   Diagnosis Date Noted  . Chronic left shoulder pain 08/23/2018  . Dysuria 08/06/2018  . History of total right knee replacement 03/21/2017  . Chronic diarrhea 12/07/2014  . Segmental colitis with rectal bleeding ([place]) 09/12/2014  . Routine general medical examination at a health care facility 06/05/2014  . Overweight 06/13/2011  . ABNORMAL CHEST XRAY 09/30/2007  . Hyperlipidemia 02/17/2007  . Essential hypertension 01/08/2007  . Osteoarthritis 01/08/2007   Past Medical History:  Diagnosis Date  . Arthritis    back, fingers with joint pain and swelling.  chronic back pain  . Chronic back pain    arthritis   . Diverticulosis   .  Elevated cholesterol    takes Niacin daily and Simvastatin  . GERD (gastroesophageal reflux disease) 06/2004   non-specific gastritis on EGD 06/2004  . Headache(784.0)    occasionally  . History of colon polyps 2004, 2009   2004:adenomatous. 2009 hyperplastic.   Marland Kitchen History of hiatal hernia   . Hypertension    takes Tenoretic and Lisinopril daily  . Mucoid cyst of joint 08/2013   right index finger  . Osteopenia 01/2014   T score -1.1 FRAX 14%/0.5%.  Stable from prior DEXA  . PONV (postoperative nausea and vomiting)   . Seasonal allergies   . Stroke ([place]) 1998   x 2 - mild left-sided weakness  . Urge incontinence   . Uterine prolapse     Family History  Problem Relation Age of Onset  . Hypertension Mother   . Heart disease Mother   . Diabetes Father   . Hypertension Father   . Heart disease Father   . Stroke Father   . Diabetes Maternal Aunt   . Diabetes Paternal Grandmother   . Hypertension Paternal Grandfather   . Stroke Paternal Grandfather     Past Surgical History:  Procedure Laterality Date  . BACK SURGERY     x 2  . BELPHAROPTOSIS REPAIR Bilateral 12/14/2017  . CATARACT EXTRACTION    . CATARACT EXTRACTION Bilateral 10/2017  . COLONOSCOPY  2004, 2009, 2014  . FINGER SURGERY    . GUM SURGERY    . GYNECOLOGIC CRYOSURGERY    . HYSTEROSCOPY W/D&C  12/07/2010   with resection of endometrial polyp  . KNEE ARTHROSCOPY Left 2008  . lip biopsy     done at MD office Fri 11/22/13  . MASS EXCISION Right 08/15/2013   Procedure: RIGHT INDEX EXCISION MASS ;  Surgeon: Tennis Must, MD;  Location: [facility];  Service: Orthopedics;  Laterality: Right;  . NASAL SEPTUM SURGERY    . OOPHORECTOMY Right 2000  . SHOULDER ARTHROSCOPY WITH OPEN ROTATOR CUFF REPAIR AND DISTAL CLAVICLE ACROMINECTOMY Right 11/26/2013   Procedure: RIGHT SHOULDER ARTHROSCOPY WITH MINI OPEN ROTATOR CUFF REPAIR AND DISTAL CLAVICLE RESECTION, SUBACROMIAL DECOMPRESSION, POSSIBLE [facility] PATCH.;  Surgeon: [name], MD;  Location: [facility];  Service: Orthopedics;  Laterality: Right;  . TOTAL KNEE ARTHROPLASTY Right 03/21/2017  . TOTAL KNEE ARTHROPLASTY Right 03/21/2017   Procedure: RIGHT TOTAL KNEE ARTHROPLASTY;  Surgeon: [name], MD;  Location: [facility];  Service: Orthopedics;  Laterality: Right;  . TUBAL LIGATION     Social History   Occupational History  . Occupation: Animal nutritionist    Comment: Disability/Retired   Tobacco Use  . Smoking status: Passive Smoke Exposure - Never Smoker  . Smokeless tobacco: Never Used  Substance and Sexual Activity  . Alcohol use: No    Alcohol/week: 0.0 standard drinks    Frequency: Never  . Drug use: No  . Sexual activity: Yes    Partners: Male    Birth control/protection: Surgical, Post-menopausal    Comment: BTL-1st intercourse 68 yo-More than 5 partners

## 2018-10-10 ENCOUNTER — Other Ambulatory Visit: Payer: Self-pay | Admitting: Internal Medicine

## 2018-10-16 ENCOUNTER — Ambulatory Visit: Payer: Medicare Other

## 2018-11-28 DIAGNOSIS — H43813 Vitreous degeneration, bilateral: Secondary | ICD-10-CM | POA: Diagnosis not present

## 2018-11-28 DIAGNOSIS — Z961 Presence of intraocular lens: Secondary | ICD-10-CM | POA: Diagnosis not present

## 2018-11-28 DIAGNOSIS — H524 Presbyopia: Secondary | ICD-10-CM | POA: Diagnosis not present

## 2018-12-06 NOTE — Progress Notes (Signed)
[id number] [place]Phone: [phone]: [phone] [facility] [place] [address]Phone: [phone]: [place], [place] [address] [place] [address]Phone: [phone]: [phone]   Your procedure is scheduled on Tuesday, September 1st.  [facility].M., and check in at the Admitting office.  Call this number if you have problems the morning of surgery:  [phone] Call [phone] you have any questions prior to your surgery date Monday-Friday 8am-4pm   Remember:  Do not eat after midnight the night before your surgery  You may drink clear liquids until 4:30 the morning of your surgery.   Clear liquids allowed are: Water, Non-Citrus Juices (without pulp), Carbonated Beverages, Clear Tea, Black Coffee Only, and Gatorade   Please complete your PRE-SURGERY ENSURE that was provided to you by 4:30 AM  the morning of surgery.  Please, if able, drink it in one setting. DO NOT SIP.   Take these medicines the morning of surgery with A SIP OF WATER  simvastatin (ZOCOR)  If needed - acetaminophen (TYLENOL), hyoscyamine (LEVSIN SL), ketotifen (ZADITOR) /eye drops    7 days prior to surgery STOP taking any Aspirin (unless otherwise instructed by your surgeon), Aleve, Naproxen, Ibuprofen, Motrin, Advil, Goody's, BC's, all herbal medications, fish oil, and all vitamins.   The Morning of Surgery  Do not wear jewelry, make-up or nail polish.  Do not wear lotions, powders, or perfumes/colognes, or deodorant  Do not shave 48 hours prior to surgery.  Do not bring valuables to the hospital.  [facility]  [facility] or [facility]ables.  If you are a smoker, DO NOT Smoke 24 hours prior to surgery IF you wear a CPAP at night please bring your mask, tubing, and machine the morning of surgery   Remember that you must have someone to transport you home after your surgery, and remain with you for 24 hours if you are discharged the same day.  Contacts, glasses, hearing aids, dentures or bridgework may not be worn into surgery.   Leave your suitcase in the car.  After surgery it may be brought to your room.  For patients admitted to the hospital, discharge time will be determined by your treatment team.  Patients discharged the day of surgery will not be allowed to drive home.   Special instructions:   [facility]- Preparing For Surgery  Before surgery, you can play an important role. Because skin is not sterile, your skin needs to be as free of germs as possible. You can reduce the number of germs on your skin by washing with CHG (chlorahexidine gluconate) Soap before surgery.  CHG is an antiseptic cleaner which kills germs and bonds with the skin to continue killing germs even after washing.    Oral Hygiene is also important to reduce your risk of infection.  Remember - BRUSH YOUR TEETH THE MORNING OF SURGERY WITH YOUR REGULAR TOOTHPASTE  Please do not use if you have an allergy to CHG or antibacterial soaps. If your skin becomes reddened/irritated stop using the CHG.  Do not shave (including legs and underarms) for at least 48 hours prior to first  CHG shower. It is OK to shave your face.  Please follow these instructions carefully.   1. Shower the NIGHT BEFORE SURGERY and the MORNING OF SURGERY with CHG Soap.   2. If you chose to wash your hair, wash your hair first as usual with your normal shampoo.  3. After you shampoo, rinse your hair and body thoroughly to remove the shampoo.  4. Use CHG as you would any other liquid soap. You can apply CHG directly to the skin and wash gently with a  scrungie or a clean washcloth.   5. Apply the CHG Soap to your body ONLY FROM THE NECK DOWN.  Do not use on open wounds or open sores. Avoid contact with your eyes, ears, mouth and genitals (private parts). Wash Face and genitals (private parts)  with your normal soap.   6. Wash thoroughly, paying special attention to the area where your surgery will be performed.  7. Thoroughly rinse your body with warm water from the neck down.  8. DO NOT shower/wash with your normal soap after using and rinsing off the CHG Soap.  9. Pat yourself dry with a CLEAN TOWEL.  10. Wear CLEAN PAJAMAS to bed the night before surgery, wear comfortable clothes the morning of surgery  11. Place CLEAN SHEETS on your bed the night of your first shower and DO NOT SLEEP WITH PETS.  Day of Surgery: Do not apply any deodorants/lotions. Please shower the morning of surgery with the CHG soap  Please wear clean clothes to the hospital/surgery center.   Remember to brush your teeth WITH YOUR REGULAR TOOTHPASTE.  Please read over the following fact sheets that you were given.

## 2018-12-07 ENCOUNTER — Other Ambulatory Visit: Payer: Self-pay

## 2018-12-07 ENCOUNTER — Other Ambulatory Visit (HOSPITAL_COMMUNITY)
Admission: RE | Admit: 2018-12-07 | Discharge: 2018-12-07 | Disposition: A | Discharge status: Home / Self Care | Payer: Medicare Other | Source: Ambulatory Visit | Attending: Orthopedic Surgery | Admitting: Orthopedic Surgery

## 2018-12-07 ENCOUNTER — Encounter (HOSPITAL_COMMUNITY)
Admission: RE | Admit: 2018-12-07 | Discharge: 2018-12-07 | Disposition: A | Discharge status: Home / Self Care | Payer: Medicare Other | Source: Ambulatory Visit | Attending: Orthopedic Surgery | Admitting: Orthopedic Surgery

## 2018-12-07 ENCOUNTER — Encounter (HOSPITAL_COMMUNITY): Payer: Self-pay

## 2018-12-07 DIAGNOSIS — Z20828 Contact with and (suspected) exposure to other viral communicable diseases: Secondary | ICD-10-CM | POA: Insufficient documentation

## 2018-12-07 DIAGNOSIS — Z01818 Encounter for other preprocedural examination: Secondary | ICD-10-CM | POA: Insufficient documentation

## 2018-12-07 DIAGNOSIS — I10 Essential (primary) hypertension: Secondary | ICD-10-CM | POA: Insufficient documentation

## 2018-12-07 LAB — CBC
HCT: 43 % (ref 36.0–46.0)
Hemoglobin: 13.9 g/dL (ref 12.0–15.0)
MCH: 28.5 pg (ref 26.0–34.0)
MCHC: 32.3 g/dL (ref 30.0–36.0)
MCV: 88.3 fL (ref 80.0–100.0)
Platelets: 257 10*3/uL (ref 150–400)
RBC: 4.87 MIL/uL (ref 3.87–5.11)
RDW: 13.2 % (ref 11.5–15.5)
WBC: 9.6 10*3/uL (ref 4.0–10.5)
nRBC: 0 % (ref 0.0–0.2)

## 2018-12-07 LAB — BASIC METABOLIC PANEL
Anion gap: 10 (ref 5–15)
BUN: 14 mg/dL (ref 8–23)
CO2: 26 mmol/L (ref 22–32)
Calcium: 9.3 mg/dL (ref 8.9–10.3)
Chloride: 105 mmol/L (ref 98–111)
Creatinine, Ser: 1.05 mg/dL — ABNORMAL HIGH (ref 0.44–1.00)
GFR calc Af Amer: >60 mL/min (ref >60)
GFR calc non Af Amer: 55 mL/min — ABNORMAL LOW (ref >60)
Glucose, Bld: 109 mg/dL — ABNORMAL HIGH (ref 70–99)
Potassium: 3.8 mmol/L (ref 3.5–5.1)
Sodium: 141 mmol/L (ref 135–145)

## 2018-12-07 LAB — URINALYSIS, ROUTINE W REFLEX MICROSCOPIC
Bacteria, UA: NONE SEEN
Bilirubin Urine: NEGATIVE
Glucose, UA: NEGATIVE mg/dL
Hgb urine dipstick: NEGATIVE
Ketones, ur: NEGATIVE mg/dL
Nitrite: NEGATIVE
Protein, ur: NEGATIVE mg/dL
Specific Gravity, Urine: 1.02 (ref 1.005–1.030)
pH: 7 (ref 5.0–8.0)

## 2018-12-07 LAB — SURGICAL PCR SCREEN
MRSA, PCR: NEGATIVE
Staphylococcus aureus: NEGATIVE

## 2018-12-07 LAB — SARS CORONAVIRUS 2 (TAT 6-24 HRS): SARS Coronavirus 2: NEGATIVE

## 2018-12-07 NOTE — Progress Notes (Signed)
PCP - Dr. Pricilla Holm Cardiologist - denies  Chest x-ray - denies EKG - 12/07/2018 Stress Test - denies ECHO - denies Cardiac Cath - denies  Sleep Study - denies CPAP - N/A  Blood Thinner Instructions: N/A Aspirin Instructions: Follow your surgeon's instructions on when to stop Aspirin.  If no instructions were given by your surgeon then you will need to call the office to get those instructions.    Anesthesia review: No  Coronavirus Screening  Have you experienced the following symptoms:  Cough yes/no: No Fever (>100.46F)  yes/no: No Runny nose yes/no: No Sore throat yes/no: No Difficulty breathing/shortness of breath  yes/no: No  Have you or a family member traveled in the last 14 days and where? yes/no: No  If the patient indicates "YES" to the above questions, their PAT will be rescheduled to limit the exposure to others and, the surgeon will be notified. THE PATIENT WILL NEED TO BE ASYMPTOMATIC FOR 14 DAYS.   If the patient is not experiencing any of these symptoms, the PAT nurse will instruct them to NOT bring anyone with them to their appointment since they may have these symptoms or traveled as well.   Please remind your patients and families that hospital visitation restrictions are in effect and the importance of the restrictions.   Patient denies shortness of breath, fever, cough and chest pain at PAT appointment  Patient verbalized understanding of instructions that were given to them at the PAT appointment. Patient was also instructed that they will need to review over the PAT instructions again at home before surgery.

## 2018-12-08 LAB — URINE CULTURE

## 2018-12-11 ENCOUNTER — Inpatient Hospital Stay (HOSPITAL_COMMUNITY): Payer: Medicare Other | Admitting: Certified Registered Nurse Anesthetist

## 2018-12-11 ENCOUNTER — Other Ambulatory Visit: Payer: Self-pay

## 2018-12-11 ENCOUNTER — Encounter (HOSPITAL_COMMUNITY)
Admission: RE | Disposition: A | Discharge status: Home / Self Care | Payer: Self-pay | Source: Home / Self Care | Attending: Orthopedic Surgery

## 2018-12-11 ENCOUNTER — Inpatient Hospital Stay (HOSPITAL_COMMUNITY)
Admission: RE | Admit: 2018-12-11 | Discharge: 2018-12-12 | DRG: 483 | Disposition: A | Discharge status: Home / Self Care | Payer: Medicare Other | Attending: Orthopedic Surgery | Admitting: Orthopedic Surgery

## 2018-12-11 ENCOUNTER — Encounter (HOSPITAL_COMMUNITY): Payer: Self-pay

## 2018-12-11 ENCOUNTER — Inpatient Hospital Stay (HOSPITAL_COMMUNITY): Payer: Medicare Other | Admitting: Vascular Surgery

## 2018-12-11 ENCOUNTER — Inpatient Hospital Stay (HOSPITAL_COMMUNITY): Payer: Medicare Other

## 2018-12-11 DIAGNOSIS — Z888 Allergy status to other drugs, medicaments and biological substances status: Secondary | ICD-10-CM | POA: Diagnosis not present

## 2018-12-11 DIAGNOSIS — K219 Gastro-esophageal reflux disease without esophagitis: Secondary | ICD-10-CM | POA: Diagnosis present

## 2018-12-11 DIAGNOSIS — I10 Essential (primary) hypertension: Secondary | ICD-10-CM | POA: Diagnosis not present

## 2018-12-11 DIAGNOSIS — M12812 Other specific arthropathies, not elsewhere classified, left shoulder: Secondary | ICD-10-CM | POA: Diagnosis not present

## 2018-12-11 DIAGNOSIS — M19019 Primary osteoarthritis, unspecified shoulder: Secondary | ICD-10-CM | POA: Diagnosis present

## 2018-12-11 DIAGNOSIS — Z7982 Long term (current) use of aspirin: Secondary | ICD-10-CM

## 2018-12-11 DIAGNOSIS — Z91018 Allergy to other foods: Secondary | ICD-10-CM | POA: Diagnosis not present

## 2018-12-11 DIAGNOSIS — Z823 Family history of stroke: Secondary | ICD-10-CM | POA: Diagnosis not present

## 2018-12-11 DIAGNOSIS — K449 Diaphragmatic hernia without obstruction or gangrene: Secondary | ICD-10-CM | POA: Diagnosis present

## 2018-12-11 DIAGNOSIS — Z8249 Family history of ischemic heart disease and other diseases of the circulatory system: Secondary | ICD-10-CM

## 2018-12-11 DIAGNOSIS — M8580 Other specified disorders of bone density and structure, unspecified site: Secondary | ICD-10-CM | POA: Diagnosis present

## 2018-12-11 DIAGNOSIS — Z91012 Allergy to eggs: Secondary | ICD-10-CM | POA: Diagnosis not present

## 2018-12-11 DIAGNOSIS — I69354 Hemiplegia and hemiparesis following cerebral infarction affecting left non-dominant side: Secondary | ICD-10-CM | POA: Diagnosis not present

## 2018-12-11 DIAGNOSIS — E7800 Pure hypercholesterolemia, unspecified: Secondary | ICD-10-CM | POA: Diagnosis present

## 2018-12-11 DIAGNOSIS — M19012 Primary osteoarthritis, left shoulder: Secondary | ICD-10-CM | POA: Diagnosis present

## 2018-12-11 DIAGNOSIS — Z9889 Other specified postprocedural states: Secondary | ICD-10-CM

## 2018-12-11 DIAGNOSIS — Z833 Family history of diabetes mellitus: Secondary | ICD-10-CM

## 2018-12-11 DIAGNOSIS — Z91013 Allergy to seafood: Secondary | ICD-10-CM | POA: Diagnosis not present

## 2018-12-11 DIAGNOSIS — E785 Hyperlipidemia, unspecified: Secondary | ICD-10-CM | POA: Diagnosis not present

## 2018-12-11 DIAGNOSIS — G8918 Other acute postprocedural pain: Secondary | ICD-10-CM | POA: Diagnosis not present

## 2018-12-11 DIAGNOSIS — Z96651 Presence of right artificial knee joint: Secondary | ICD-10-CM | POA: Diagnosis not present

## 2018-12-11 DIAGNOSIS — K5790 Diverticulosis of intestine, part unspecified, without perforation or abscess without bleeding: Secondary | ICD-10-CM | POA: Diagnosis present

## 2018-12-11 HISTORY — PX: REVERSE SHOULDER ARTHROPLASTY: SHX5054

## 2018-12-11 HISTORY — DX: Other specific arthropathies, not elsewhere classified, unspecified shoulder: M12.819

## 2018-12-11 SURGERY — ARTHROPLASTY, SHOULDER, TOTAL, REVERSE
Anesthesia: General | Laterality: Left

## 2018-12-11 MED ORDER — ASPIRIN 81 MG PO CHEW
81.0000 mg | CHEWABLE_TABLET | Freq: Two times a day (BID) | ORAL | Status: DC
  Administered 2018-12-11 – 2018-12-12 (×2): 81 mg via ORAL
  Filled 2018-12-11 (×2): qty 1

## 2018-12-11 MED ORDER — LACTATED RINGERS IV SOLN: INTRAVENOUS | Status: DC

## 2018-12-11 MED ORDER — EPHEDRINE SULFATE 50 MG/ML IJ SOLN
INTRAMUSCULAR | Status: DC | PRN
  Administered 2018-12-11: 5 mg via INTRAVENOUS

## 2018-12-11 MED ORDER — DIPHENHYDRAMINE HCL 50 MG/ML IJ SOLN
INTRAMUSCULAR | Status: DC | PRN
  Administered 2018-12-11: 12.5 mg via INTRAVENOUS

## 2018-12-11 MED ORDER — VANCOMYCIN HCL 1000 MG IV SOLR
INTRAVENOUS | Status: AC
  Filled 2018-12-11: qty 1000

## 2018-12-11 MED ORDER — MENTHOL 3 MG MT LOZG: 1.0000 | LOZENGE | OROMUCOSAL | Status: DC | PRN

## 2018-12-11 MED ORDER — METHOCARBAMOL 500 MG PO TABS: 500.0000 mg | ORAL_TABLET | Freq: Four times a day (QID) | ORAL | Status: DC | PRN

## 2018-12-11 MED ORDER — CHLORHEXIDINE GLUCONATE 4 % EX LIQD: 60.0000 mL | Freq: Once | CUTANEOUS | Status: DC

## 2018-12-11 MED ORDER — ONDANSETRON HCL 4 MG/2ML IJ SOLN
INTRAMUSCULAR | Status: AC
  Filled 2018-12-11: qty 2

## 2018-12-11 MED ORDER — PHENYLEPHRINE 40 MCG/ML (10ML) SYRINGE FOR IV PUSH (FOR BLOOD PRESSURE SUPPORT)
PREFILLED_SYRINGE | INTRAVENOUS | Status: AC
  Filled 2018-12-11: qty 10

## 2018-12-11 MED ORDER — EPHEDRINE 5 MG/ML INJ
INTRAVENOUS | Status: AC
  Filled 2018-12-11: qty 10

## 2018-12-11 MED ORDER — NON FORMULARY
Status: DC | PRN
  Administered 2018-12-11: 09:00:00 450 mL via TOPICAL

## 2018-12-11 MED ORDER — 0.9 % SODIUM CHLORIDE (POUR BTL) OPTIME
TOPICAL | Status: DC | PRN
  Administered 2018-12-11 (×6): 1000 mL

## 2018-12-11 MED ORDER — ACETAMINOPHEN 160 MG/5ML PO SOLN: 325.0000 mg | Freq: Once | ORAL | Status: DC | PRN

## 2018-12-11 MED ORDER — PHENYLEPHRINE HCL (PRESSORS) 10 MG/ML IV SOLN
INTRAVENOUS | Status: DC | PRN
  Administered 2018-12-11 (×2): 80 ug via INTRAVENOUS
  Administered 2018-12-11 (×2): 120 ug via INTRAVENOUS

## 2018-12-11 MED ORDER — METHOCARBAMOL 1000 MG/10ML IJ SOLN
500.0000 mg | Freq: Four times a day (QID) | INTRAVENOUS | Status: DC | PRN
  Filled 2018-12-11: qty 5

## 2018-12-11 MED ORDER — FENTANYL CITRATE (PF) 250 MCG/5ML IJ SOLN
INTRAMUSCULAR | Status: DC | PRN
  Administered 2018-12-11: 25 ug via INTRAVENOUS
  Administered 2018-12-11: 75 ug via INTRAVENOUS

## 2018-12-11 MED ORDER — VANCOMYCIN HCL 1000 MG IV SOLR
INTRAVENOUS | Status: DC | PRN
  Administered 2018-12-11: 1000 mg via TOPICAL

## 2018-12-11 MED ORDER — CEFAZOLIN SODIUM-DEXTROSE 2-4 GM/100ML-% IV SOLN
2.0000 g | INTRAVENOUS | Status: AC
  Administered 2018-12-11: 08:00:00 2 g via INTRAVENOUS

## 2018-12-11 MED ORDER — CEFAZOLIN SODIUM-DEXTROSE 2-4 GM/100ML-% IV SOLN
2.0000 g | Freq: Four times a day (QID) | INTRAVENOUS | Status: AC
  Administered 2018-12-11 – 2018-12-12 (×3): 2 g via INTRAVENOUS
  Filled 2018-12-11 (×3): qty 100

## 2018-12-11 MED ORDER — ACETAMINOPHEN 10 MG/ML IV SOLN
INTRAVENOUS | Status: AC
  Filled 2018-12-11: qty 100

## 2018-12-11 MED ORDER — PROPOFOL 10 MG/ML IV BOLUS
INTRAVENOUS | Status: AC
  Filled 2018-12-11: qty 20

## 2018-12-11 MED ORDER — MEPERIDINE HCL 25 MG/ML IJ SOLN: 6.2500 mg | INTRAMUSCULAR | Status: DC | PRN

## 2018-12-11 MED ORDER — ROCURONIUM BROMIDE 10 MG/ML (PF) SYRINGE
PREFILLED_SYRINGE | INTRAVENOUS | Status: AC
  Filled 2018-12-11: qty 10

## 2018-12-11 MED ORDER — MIDAZOLAM HCL 2 MG/2ML IJ SOLN
INTRAMUSCULAR | Status: AC
  Filled 2018-12-11: qty 2

## 2018-12-11 MED ORDER — ONDANSETRON HCL 4 MG/2ML IJ SOLN: 4.0000 mg | Freq: Four times a day (QID) | INTRAMUSCULAR | Status: DC | PRN

## 2018-12-11 MED ORDER — ACETAMINOPHEN 10 MG/ML IV SOLN
1000.0000 mg | Freq: Once | INTRAVENOUS | Status: DC | PRN
  Administered 2018-12-11: 1000 mg via INTRAVENOUS

## 2018-12-11 MED ORDER — STERILE WATER FOR IRRIGATION IR SOLN
Status: DC | PRN
  Administered 2018-12-11: 1000 mL

## 2018-12-11 MED ORDER — HYDROMORPHONE HCL 1 MG/ML IJ SOLN: 0.5000 mg | INTRAMUSCULAR | Status: DC | PRN

## 2018-12-11 MED ORDER — ONDANSETRON HCL 4 MG PO TABS: 4.0000 mg | ORAL_TABLET | Freq: Four times a day (QID) | ORAL | Status: DC | PRN

## 2018-12-11 MED ORDER — ACETAMINOPHEN 325 MG PO TABS: 325.0000 mg | ORAL_TABLET | Freq: Once | ORAL | Status: DC | PRN

## 2018-12-11 MED ORDER — FENTANYL CITRATE (PF) 250 MCG/5ML IJ SOLN
INTRAMUSCULAR | Status: AC
  Filled 2018-12-11: qty 5

## 2018-12-11 MED ORDER — LIDOCAINE 2% (20 MG/ML) 5 ML SYRINGE
INTRAMUSCULAR | Status: AC
  Filled 2018-12-11: qty 5

## 2018-12-11 MED ORDER — PROPOFOL 10 MG/ML IV BOLUS
INTRAVENOUS | Status: DC | PRN
  Administered 2018-12-11: 120 mg via INTRAVENOUS

## 2018-12-11 MED ORDER — ROCURONIUM BROMIDE 50 MG/5ML IV SOSY
PREFILLED_SYRINGE | INTRAVENOUS | Status: DC | PRN
  Administered 2018-12-11: 40 mg via INTRAVENOUS

## 2018-12-11 MED ORDER — HYDROMORPHONE HCL 1 MG/ML IJ SOLN: 0.2500 mg | INTRAMUSCULAR | Status: DC | PRN

## 2018-12-11 MED ORDER — METOCLOPRAMIDE HCL 5 MG/ML IJ SOLN: 5.0000 mg | Freq: Three times a day (TID) | INTRAMUSCULAR | Status: DC | PRN

## 2018-12-11 MED ORDER — BUPIVACAINE HCL (PF) 0.5 % IJ SOLN
INTRAMUSCULAR | Status: DC | PRN
  Administered 2018-12-11: 15 mL

## 2018-12-11 MED ORDER — SODIUM CHLORIDE 0.9 % IV SOLN
INTRAVENOUS | Status: DC | PRN
  Administered 2018-12-11: 08:00:00 25 ug/min via INTRAVENOUS

## 2018-12-11 MED ORDER — TRANEXAMIC ACID-NACL 1000-0.7 MG/100ML-% IV SOLN
1000.0000 mg | INTRAVENOUS | Status: AC
  Administered 2018-12-11: 1000 mg via INTRAVENOUS

## 2018-12-11 MED ORDER — OXYCODONE HCL 5 MG PO TABS
5.0000 mg | ORAL_TABLET | ORAL | Status: DC | PRN
  Administered 2018-12-11 – 2018-12-12 (×3): 5 mg via ORAL
  Filled 2018-12-11 (×3): qty 1

## 2018-12-11 MED ORDER — LACTATED RINGERS IV SOLN
INTRAVENOUS | Status: DC | PRN
  Administered 2018-12-11 (×3): via INTRAVENOUS

## 2018-12-11 MED ORDER — PHENOL 1.4 % MT LIQD: 1.0000 | OROMUCOSAL | Status: DC | PRN

## 2018-12-11 MED ORDER — SUGAMMADEX SODIUM 200 MG/2ML IV SOLN
INTRAVENOUS | Status: DC | PRN
  Administered 2018-12-11: 171.2 mg via INTRAVENOUS

## 2018-12-11 MED ORDER — LACTATED RINGERS IV SOLN
INTRAVENOUS | Status: AC
  Administered 2018-12-11: 14:00:00 via INTRAVENOUS

## 2018-12-11 MED ORDER — VANCOMYCIN HCL IN DEXTROSE 1-5 GM/200ML-% IV SOLN
INTRAVENOUS | Status: AC
  Filled 2018-12-11: qty 200

## 2018-12-11 MED ORDER — DEXAMETHASONE SODIUM PHOSPHATE 10 MG/ML IJ SOLN
INTRAMUSCULAR | Status: DC | PRN
  Administered 2018-12-11: 10 mg via INTRAVENOUS

## 2018-12-11 MED ORDER — DIPHENHYDRAMINE HCL 50 MG/ML IJ SOLN
INTRAMUSCULAR | Status: AC
  Filled 2018-12-11: qty 1

## 2018-12-11 MED ORDER — DEXAMETHASONE SODIUM PHOSPHATE 10 MG/ML IJ SOLN
INTRAMUSCULAR | Status: AC
  Filled 2018-12-11: qty 1

## 2018-12-11 MED ORDER — LIDOCAINE 2% (20 MG/ML) 5 ML SYRINGE
INTRAMUSCULAR | Status: DC | PRN
  Administered 2018-12-11: 40 mg via INTRAVENOUS

## 2018-12-11 MED ORDER — PROMETHAZINE HCL 25 MG/ML IJ SOLN
INTRAMUSCULAR | Status: AC
  Administered 2018-12-11: 6.25 mg via INTRAVENOUS
  Filled 2018-12-11: qty 1

## 2018-12-11 MED ORDER — TRANEXAMIC ACID-NACL 1000-0.7 MG/100ML-% IV SOLN
INTRAVENOUS | Status: AC
  Filled 2018-12-11: qty 100

## 2018-12-11 MED ORDER — ACETAMINOPHEN 325 MG PO TABS
325.0000 mg | ORAL_TABLET | Freq: Four times a day (QID) | ORAL | Status: AC
  Administered 2018-12-11 – 2018-12-12 (×3): 325 mg via ORAL
  Filled 2018-12-11 (×3): qty 1

## 2018-12-11 MED ORDER — BUPIVACAINE LIPOSOME 1.3 % IJ SUSP
INTRAMUSCULAR | Status: DC | PRN
  Administered 2018-12-11: 10 mL

## 2018-12-11 MED ORDER — DOCUSATE SODIUM 100 MG PO CAPS
100.0000 mg | ORAL_CAPSULE | Freq: Two times a day (BID) | ORAL | Status: DC
  Filled 2018-12-11 (×2): qty 1

## 2018-12-11 MED ORDER — CEFAZOLIN SODIUM-DEXTROSE 2-4 GM/100ML-% IV SOLN
INTRAVENOUS | Status: AC
  Filled 2018-12-11: qty 100

## 2018-12-11 MED ORDER — ONDANSETRON HCL 4 MG/2ML IJ SOLN
INTRAMUSCULAR | Status: DC | PRN
  Administered 2018-12-11: 4 mg via INTRAVENOUS

## 2018-12-11 MED ORDER — METOCLOPRAMIDE HCL 5 MG PO TABS: 5.0000 mg | ORAL_TABLET | Freq: Three times a day (TID) | ORAL | Status: DC | PRN

## 2018-12-11 MED ORDER — MIDAZOLAM HCL 2 MG/2ML IJ SOLN
INTRAMUSCULAR | Status: DC | PRN
  Administered 2018-12-11: 2 mg via INTRAVENOUS

## 2018-12-11 MED ORDER — PROMETHAZINE HCL 25 MG/ML IJ SOLN
6.2500 mg | INTRAMUSCULAR | Status: AC | PRN
  Administered 2018-12-11 (×2): 6.25 mg via INTRAVENOUS

## 2018-12-11 SURGICAL SUPPLY — 83 items
ALCOHOL 70% 16 OZ (MISCELLANEOUS) ×3 IMPLANT
APL PRP STRL LF DISP 70% ISPRP (MISCELLANEOUS) ×1
AUG COMP REV MI TAPER ADAPTER (Joint) ×3 IMPLANT
AUGMENT COMP REV MI TAPR ADPTR (Joint) ×1 IMPLANT
BEARING HUMERAL SHLDER 36M STD (Shoulder) IMPLANT
BIT DRILL 2.7 W/STOP DISP (BIT) ×1 IMPLANT
BIT DRILL 2.7MM W/STOP DISP (BIT) ×1
BIT DRILL TWIST 2.7 (BIT) ×1 IMPLANT
BIT DRILL TWIST 2.7MM (BIT) ×1
BLADE SAW SGTL 13X75X1.27 (BLADE) ×3 IMPLANT
BRNG HUM STD 36 RVRS SHLDR (Shoulder) ×1 IMPLANT
BSPLAT GLND SM AUG TPR ADPR (Joint) ×1 IMPLANT
CHLORAPREP W/TINT 26 (MISCELLANEOUS) ×3 IMPLANT
CLOSURE WOUND 1/2 X4 (GAUZE/BANDAGES/DRESSINGS) ×2
COVER SURGICAL LIGHT HANDLE (MISCELLANEOUS) ×3 IMPLANT
COVER WAND RF STERILE (DRAPES) ×1 IMPLANT
DRAPE INCISE IOBAN 66X45 STRL (DRAPES) ×3 IMPLANT
DRAPE U-SHAPE 47X51 STRL (DRAPES) ×6 IMPLANT
DRSG AQUACEL AG ADV 3.5X10 (GAUZE/BANDAGES/DRESSINGS) ×3 IMPLANT
ELECT BLADE 4.0 EZ CLEAN MEGAD (MISCELLANEOUS) ×3
ELECT REM PT RETURN 9FT ADLT (ELECTROSURGICAL) ×3
ELECTRODE BLDE 4.0 EZ CLN MEGD (MISCELLANEOUS) ×1 IMPLANT
ELECTRODE REM PT RTRN 9FT ADLT (ELECTROSURGICAL) ×1 IMPLANT
GAUZE SPONGE 4X4 12PLY STRL LF (GAUZE/BANDAGES/DRESSINGS) ×3 IMPLANT
GLENOID SPHERE STD STRL 36MM (Orthopedic Implant) ×2 IMPLANT
GLOVE BIOGEL PI IND STRL 7.5 (GLOVE) ×1 IMPLANT
GLOVE BIOGEL PI IND STRL 8 (GLOVE) ×1 IMPLANT
GLOVE BIOGEL PI INDICATOR 7.5 (GLOVE) ×2
GLOVE BIOGEL PI INDICATOR 8 (GLOVE) ×2
GLOVE ECLIPSE 7.0 STRL STRAW (GLOVE) ×3 IMPLANT
GLOVE SURG ORTHO 8.0 STRL STRW (GLOVE) ×3 IMPLANT
GOWN STRL REUS W/ TWL LRG LVL3 (GOWN DISPOSABLE) ×2 IMPLANT
GOWN STRL REUS W/ TWL XL LVL3 (GOWN DISPOSABLE) IMPLANT
GOWN STRL REUS W/TWL LRG LVL3 (GOWN DISPOSABLE) ×6
GOWN STRL REUS W/TWL XL LVL3 (GOWN DISPOSABLE)
GUIDE MODEL REV SHLD LT (ORTHOPEDIC DISPOSABLE SUPPLIES) ×2 IMPLANT
HYDROGEN PEROXIDE 16OZ (MISCELLANEOUS) ×3 IMPLANT
JET LAVAGE IRRISEPT WOUND (IRRIGATION / IRRIGATOR) ×3
KIT BASIN OR (CUSTOM PROCEDURE TRAY) ×3 IMPLANT
KIT TURNOVER KIT B (KITS) ×3 IMPLANT
LAVAGE JET IRRISEPT WOUND (IRRIGATION / IRRIGATOR) IMPLANT
LOOP VESSEL MAXI BLUE (MISCELLANEOUS) ×3 IMPLANT
MANIFOLD NEPTUNE II (INSTRUMENTS) ×3 IMPLANT
NDL SUT 6 .5 CRC .975X.05 MAYO (NEEDLE) ×1 IMPLANT
NEEDLE MAYO TAPER (NEEDLE) ×3
NS IRRIG 1000ML POUR BTL (IV SOLUTION) ×3 IMPLANT
PACK SHOULDER (CUSTOM PROCEDURE TRAY) ×3 IMPLANT
PAD ARMBOARD 7.5X6 YLW CONV (MISCELLANEOUS) ×6 IMPLANT
PIN HUMERAL STMN 3.2MMX9IN (INSTRUMENTS) ×2 IMPLANT
PIN THREADED REVERSE (PIN) ×2 IMPLANT
REAMER GUIDE BUSHING SURG DISP (MISCELLANEOUS) ×2 IMPLANT
REAMER GUIDE W/SCREW AUG (MISCELLANEOUS) ×2 IMPLANT
RETRIEVER SUT HEWSON (MISCELLANEOUS) ×3 IMPLANT
SCREW BONE LOCKING 4.75X35X3.5 (Screw) ×2 IMPLANT
SCREW BONE STRL 6.5MMX25MM (Screw) ×3 IMPLANT
SCREW LOCKING 4.75MMX15MM (Screw) ×6 IMPLANT
SCREW LOCKING STRL 4.75X25X3.5 (Screw) ×2 IMPLANT
SHOULDER HUMERAL BEAR 36M STD (Shoulder) ×3 IMPLANT
SLING ARM IMMOBILIZER LRG (SOFTGOODS) ×3 IMPLANT
SOL PREP POV-IOD 4OZ 10% (MISCELLANEOUS) ×3 IMPLANT
SPONGE LAP 18X18 RF (DISPOSABLE) ×5 IMPLANT
STEM HUMERAL STRL 9MMX83MM (Stem) ×2 IMPLANT
STRIP CLOSURE SKIN 1/2X4 (GAUZE/BANDAGES/DRESSINGS) ×3 IMPLANT
SUCTION FRAZIER HANDLE 10FR (MISCELLANEOUS) ×2
SUCTION TUBE FRAZIER 10FR DISP (MISCELLANEOUS) ×1 IMPLANT
SUT BROADBAND TAPE 2PK 1.5 (SUTURE) ×4 IMPLANT
SUT FIBERWIRE #2 38 T-5 BLUE (SUTURE)
SUT MAXBRAID (SUTURE) IMPLANT
SUT MNCRL AB 3-0 PS2 18 (SUTURE) ×3 IMPLANT
SUT SILK 2 0 TIES 10X30 (SUTURE) ×3 IMPLANT
SUT VIC AB 0 CT1 27 (SUTURE) ×12
SUT VIC AB 0 CT1 27XBRD ANBCTR (SUTURE) ×4 IMPLANT
SUT VIC AB 1 CT1 27 (SUTURE) ×6
SUT VIC AB 1 CT1 27XBRD ANBCTR (SUTURE) ×2 IMPLANT
SUT VIC AB 2-0 CT1 27 (SUTURE) ×9
SUT VIC AB 2-0 CT1 TAPERPNT 27 (SUTURE) ×3 IMPLANT
SUT VICRYL 0 UR6 27IN ABS (SUTURE) ×4 IMPLANT
SUTURE FIBERWR #2 38 T-5 BLUE (SUTURE) IMPLANT
TOWEL GREEN STERILE (TOWEL DISPOSABLE) ×3 IMPLANT
TRAY FOL W/BAG SLVR 16FR STRL (SET/KITS/TRAYS/PACK) IMPLANT
TRAY FOLEY W/BAG SLVR 16FR LF (SET/KITS/TRAYS/PACK)
TRAY HUM REV SHOULDER STD +6 (Shoulder) ×2 IMPLANT
WATER STERILE IRR 1000ML POUR (IV SOLUTION) ×3 IMPLANT

## 2018-12-11 NOTE — Anesthesia Postprocedure Evaluation (Signed)
Anesthesia Post Note  Patient: [name]  Procedure(s) Performed: LEFT REVERSE SHOULDER ARTHROPLASTY (Left )     Patient location during evaluation: PACU Anesthesia Type: General Level of consciousness: awake and alert Pain management: pain level controlled Vital Signs Assessment: post-procedure vital signs reviewed and stable Respiratory status: spontaneous breathing, nonlabored ventilation, respiratory function stable and patient connected to nasal cannula oxygen Cardiovascular status: blood pressure returned to baseline and stable Postop Assessment: no apparent nausea or vomiting Anesthetic complications: no    Last Vitals:  Vitals:   12/11/18 1300 12/11/18 1324  BP:  (!) 148/83  Pulse: 65 62  Resp: 13 18  Temp: 36.4 C   SpO2: 96% 100%    Last Pain:  Vitals:   12/11/18 1300  TempSrc:   PainSc: [name]

## 2018-12-11 NOTE — Anesthesia Preprocedure Evaluation (Addendum)
Anesthesia Evaluation  Patient identified by MRN, date of birth, ID band Patient awake    Reviewed: Allergy & Precautions, NPO status , Patient's Chart, lab work & pertinent test results  History of Anesthesia Complications (+) PONV  Airway Mallampati: II  TM Distance: <3 FB Neck ROM: Full    Dental  (+) Teeth Intact, Dental Advisory Given   Pulmonary    breath sounds clear to auscultation       Cardiovascular hypertension, Pt. on home beta blockers and Pt. on medications  Rhythm:Regular Rate:Normal     Neuro/Psych  Headaches, CVA, Residual Symptoms    GI/Hepatic Neg liver ROS, hiatal hernia, GERD  ,  Endo/Other  negative endocrine ROS  Renal/GU negative Renal ROS     Musculoskeletal  (+) Arthritis ,   Abdominal Normal abdominal exam  (+)   Peds  Hematology negative hematology ROS (+)   Anesthesia Other Findings   Reproductive/Obstetrics                           Anesthesia Physical Anesthesia Plan  ASA: III  Anesthesia Plan: General   Post-op Pain Management: GA combined w/ Regional for post-op pain   Induction: Intravenous  PONV Risk Score and Plan: 4 or greater and Ondansetron, Dexamethasone, Treatment may vary due to age or medical condition and Midazolam  Airway Management Planned: Oral ETT  Additional Equipment: None  Intra-op Plan:   Post-operative Plan: Extubation in OR  Informed Consent: I have reviewed the patients History and Physical, chart, labs and discussed the procedure including the risks, benefits and alternatives for the proposed anesthesia with the patient or authorized representative who has indicated his/her understanding and acceptance.     Dental advisory given  Plan Discussed with: CRNA  Anesthesia Plan Comments:        Anesthesia Quick Evaluation

## 2018-12-11 NOTE — Anesthesia Procedure Notes (Signed)
Procedure Name: Intubation Date/Time: 12/11/2018 8:19 AM Performed by: Kathryne Hitch, CRNA Pre-anesthesia Checklist: Patient identified, Emergency Drugs available, Suction available and Patient being monitored Patient Re-evaluated:Patient Re-evaluated prior to induction Oxygen Delivery Method: Circle system utilized Preoxygenation: Pre-oxygenation with 100% oxygen Induction Type: IV induction Ventilation: Mask ventilation without difficulty and Oral airway inserted - appropriate to patient size Laryngoscope Size: Mac and 4 Grade View: Grade III Tube type: Oral Tube size: 7.0 mm Number of attempts: 2 Airway Equipment and Method: Stylet and Oral airway Placement Confirmation: ETT inserted through vocal cords under direct vision,  positive ETCO2 and breath sounds checked- equal and bilateral Secured at: 21 cm Tube secured with: Tape Dental Injury: Teeth and Oropharynx as per pre-operative assessment  Difficulty Due To: Difficult Airway- due to anterior larynx

## 2018-12-11 NOTE — Evaluation (Signed)
Physical Therapy Evaluation Patient Details Name: [name] MRN: [id number] DOB: 12/05/1950 Today's Date: 12/11/2018   History of Present Illness  Pt is a 68 y/o female s/p L reverse TSA. PMH includes R TKA, HTN, osteopenia, and back surgery.   Clinical Impression  Pt is s/p surgery above with deficits below. Mild unsteadiness noted and pt with one LOB requiring min A for steadying assist. Feel pt will progress well and will not require PT follow up. Will continue to follow acutely to maximize functional mobility independence and safety.     Follow Up Recommendations No PT follow up    Equipment Recommendations  None recommended by PT    Recommendations for Other Services       Precautions / Restrictions Precautions Precautions: Fall;Shoulder Shoulder Interventions: Shoulder sling/immobilizer Restrictions Weight Bearing Restrictions: Yes LUE Weight Bearing: Non weight bearing      Mobility  Bed Mobility Overal bed mobility: Needs Assistance Bed Mobility: Supine to Sit;Sit to Supine     Supine to sit: Min guard Sit to supine: Supervision   General bed mobility comments: Min guard for safety to come to sitting.  Transfers Overall transfer level: Needs assistance Equipment used: 1 person hand held assist Transfers: Sit to/from Stand Sit to Stand: Min assist         General transfer comment: Min A for steadying assist to stand.   Ambulation/Gait Ambulation/Gait assistance: Min assist;Min guard Gait Distance (Feet): 100 Feet Assistive device: None Gait Pattern/deviations: Step-through pattern;Decreased stride length;Staggering right Gait velocity: decreased   General Gait Details: Slow, mildly unsteady gait. One LOB noted and required min A for steadying assist. Reports some "wooziness" so mobility limited.   Stairs            Wheelchair Mobility    Modified Rankin (Stroke Patients Only)       Balance Overall balance assessment: Needs  assistance Sitting-balance support: No upper extremity supported;Feet supported Sitting balance-[name] Scale: Good     Standing balance support: No upper extremity supported;During functional activity Standing balance-[name] Scale: Fair                               Pertinent Vitals/Pain Pain Assessment: No/denies pain    Home Living Family/patient expects to be discharged to:: Private residence Living Arrangements: Spouse/significant other Available Help at Discharge: Family;Available 24 hours/day Type of Home: House Home Access: Stairs to enter Entrance Stairs-Rails: None Entrance Stairs-Number of Steps: 2 Home Layout: One level Home Equipment: Cane - single point;Shower seat      Prior Function Level of Independence: Independent with assistive device(s)         Comments: Was independent with ambulation, however, used cane for stair navigation.      Hand Dominance        Extremity/Trunk Assessment   Upper Extremity Assessment Upper Extremity Assessment: Defer to OT evaluation;LUE deficits/detail LUE Deficits / Details: LUE in sling. reports numbness throughout LUE  LUE Sensation: decreased light touch    Lower Extremity Assessment Lower Extremity Assessment: Generalized weakness    Cervical / Trunk Assessment Cervical / Trunk Assessment: Normal  Communication   Communication: No difficulties  Cognition Arousal/Alertness: Awake/alert Behavior During Therapy: WFL for tasks assessed/performed Overall Cognitive Status: Within Functional Limits for tasks assessed  General Comments      Exercises     Assessment/Plan    PT Assessment Patient needs continued PT services  PT Problem List Decreased strength;Decreased balance;Decreased mobility;Decreased knowledge of precautions;Impaired sensation       PT Treatment Interventions DME instruction;Gait training;Functional mobility  training;Therapeutic activities;Stair training;Balance training;Therapeutic exercise;Patient/family education    PT Goals (Current goals can be found in the Care Plan section)  Acute Rehab PT Goals Patient Stated Goal: to go home PT Goal Formulation: With patient Time For Goal Achievement: 12/25/18 Potential to Achieve Goals: Good    Frequency Min 5X/week   Barriers to discharge        Co-evaluation               AM-PAC PT "6 Clicks" Mobility  Outcome Measure Help needed turning from your back to your side while in a flat bed without using bedrails?: A Little Help needed moving from lying on your back to sitting on the side of a flat bed without using bedrails?: A Little Help needed moving to and from a bed to a chair (including a wheelchair)?: A Little Help needed standing up from a chair using your arms (e.g., wheelchair or bedside chair)?: A Little Help needed to walk in hospital room?: A Little Help needed climbing 3-5 steps with a railing? : A Little 6 Click Score: 18    End of Session Equipment Utilized During Treatment: Gait belt;Other (comment)(L sling) Activity Tolerance: Patient tolerated treatment well Patient left: in bed;with call bell/phone within reach Nurse Communication: Mobility status PT Visit Diagnosis: Unsteadiness on feet (R26.81);Muscle weakness (generalized) (M62.81)    Time: 1950-9326 PT Time Calculation (min) (ACUTE ONLY): 23 min   Charges:   PT Evaluation $PT Eval Low Complexity: 1 Low PT Treatments $Gait Training: 8-22 mins        Leighton Ruff, PT, DPT  Acute Rehabilitation Services  Pager: [phone] Office: [phone]   Rudean Hitt 12/11/2018, 6:16 PM

## 2018-12-11 NOTE — Transfer of Care (Signed)
Immediate Anesthesia Transfer of Care Note  Patient: [name]  Procedure(s) Performed: LEFT REVERSE SHOULDER ARTHROPLASTY (Left )  Patient Location: PACU  Anesthesia Type:General  Level of Consciousness: drowsy and patient cooperative  Airway & Oxygen Therapy: Patient Spontanous Breathing  Post-op Assessment: Report given to RN and Post -op Vital signs reviewed and stable  Post vital signs: Reviewed and stable  Last Vitals:  Vitals Value Taken Time  BP 139/79 12/11/18 1046  Temp    Pulse 86 12/11/18 1047  Resp 16 12/11/18 1047  SpO2 95 % 12/11/18 1047  Vitals shown include unvalidated device data.  Last Pain:  Vitals:   12/11/18 0607  TempSrc: Oral  PainSc: 0-No pain         Complications: No apparent anesthesia complications

## 2018-12-11 NOTE — Brief Op Note (Signed)
   12/11/2018  10:24 AM  PATIENT:  [name]  68 y.o. female  PRE-OPERATIVE DIAGNOSIS:  left shoulder rotator cuff arthropathy  POST-OPERATIVE DIAGNOSIS:  left shoulder rotator cuff arthropathy  PROCEDURE:  Procedure(s): LEFT REVERSE SHOULDER ARTHROPLASTY  SURGEON:  Surgeon(s): Meredith Pel, MD  ASSISTANT: magnant pa  ANESTHESIA:   general  EBL: 100 ml    Total I/O In: 1000 [I.V.:1000] Out: 100 [Blood:100]  BLOOD ADMINISTERED: none  DRAINS: none   LOCAL MEDICATIONS USED:  vanco  SPECIMEN:  No Specimen  COUNTS:  YES  TOURNIQUET:  * No tourniquets in log *  DICTATION: .Other Dictation: Dictation Number [id number]  PLAN OF CARE: Admit for overnight observation  PATIENT DISPOSITION:  PACU - hemodynamically stable

## 2018-12-11 NOTE — Anesthesia Procedure Notes (Signed)
Anesthesia Regional Block: Interscalene brachial plexus block   Pre-Anesthetic Checklist: ,, timeout performed, Correct Patient, Correct Site, Correct Laterality, Correct Procedure, Correct Position, site marked, Risks and benefits discussed,  Surgical consent,  Pre-op evaluation,  At surgeon's request and post-op pain management  Laterality: Left  Prep: chloraprep       Needles:  Injection technique: Single-shot  Needle Type: Echogenic Stimulator Needle     Needle Length: 9cm  Needle Gauge: 21     Additional Needles:   Procedures:,,,, ultrasound used (permanent image in chart),,,,  Narrative:  Start time: 12/11/2018 6:50 AM End time: 12/11/2018 7:00 AM Injection made incrementally with aspirations every 5 mL.  Performed by: Personally  Anesthesiologist: Effie Berkshire, MD  Additional Notes: Patient tolerated the procedure well. Local anesthetic introduced in an incremental fashion under minimal resistance after negative aspirations. No paresthesias were elicited. After completion of the procedure, no acute issues were identified and patient continued to be monitored by RN.

## 2018-12-11 NOTE — Plan of Care (Signed)
  Problem: Safety: Goal: Ability to remain free from injury will improve Outcome: Progressing   Problem: Education: Goal: Knowledge of the prescribed therapeutic regimen will improve Outcome: Progressing Goal: Understanding of activity limitations/precautions following surgery will improve Outcome: Progressing   Problem: Activity: Goal: Ability to tolerate increased activity will improve Outcome: Progressing   Problem: Pain Management: Goal: Pain level will decrease with appropriate interventions Outcome: Progressing   Problem: Education: Goal: Knowledge of General Education information will improve Description: Including pain rating scale, medication(s)/side effects and non-pharmacologic comfort measures Outcome: Progressing   Problem: Health Behavior/Discharge Planning: Goal: Ability to manage health-related needs will improve Outcome: Progressing   Problem: Clinical Measurements: Goal: Ability to maintain clinical measurements within normal limits will improve Outcome: Progressing Goal: Will remain free from infection Outcome: Progressing Goal: Diagnostic test results will improve Outcome: Progressing Goal: Respiratory complications will improve Outcome: Progressing Goal: Cardiovascular complication will be avoided Outcome: Progressing   Problem: Activity: Goal: Risk for activity intolerance will decrease Outcome: Progressing   Problem: Nutrition: Goal: Adequate nutrition will be maintained Outcome: Progressing   Problem: Coping: Goal: Level of anxiety will decrease Outcome: Progressing   Problem: Elimination: Goal: Will not experience complications related to bowel motility Outcome: Progressing Goal: Will not experience complications related to urinary retention Outcome: Progressing   Problem: Pain Managment: Goal: General experience of comfort will improve Outcome: Progressing   Problem: Safety: Goal: Ability to remain free from injury will  improve Outcome: Progressing   Problem: Skin Integrity: Goal: Risk for impaired skin integrity will decrease Outcome: Progressing

## 2018-12-11 NOTE — H&P (Signed)
[name] is an 68 y.o. female.   Chief Complaint: Left shoulder pain HPI: [name] is a 68 year old patient with left shoulder pain.  She is had a long history of left shoulder pain.  Had rotator cuff repair on the right and did well with that 4 years ago.  She has had significant pain and functional limitation on the left-hand side.  Work-up demonstrates rotator cuff arthropathy in the left shoulder.  She has failed conservative management including injections and activity modification.  No personal or family history of DVT or pulmonary embolism.  Past Medical History:  Diagnosis Date  . Arthritis    back, fingers with joint pain and swelling.  chronic back pain  . Chronic back pain    arthritis   . Diverticulosis   . Elevated cholesterol    takes Niacin daily and Simvastatin  . GERD (gastroesophageal reflux disease) 06/2004   non-specific gastritis on EGD 06/2004  . Headache(784.0)    occasionally  . History of colon polyps 2004, 2009   2004:adenomatous. 2009 hyperplastic.   Marland Kitchen History of hiatal hernia   . Hypertension    takes Tenoretic and Lisinopril daily  . Mucoid cyst of joint 08/2013   right index finger  . Osteopenia 01/2014   T score -1.1 FRAX 14%/0.5%. Stable from prior DEXA  . PONV (postoperative nausea and vomiting)   . Rotator cuff arthropathy    Left  . Seasonal allergies   . Stroke ([place]) 1998   x 2 - mild left-sided weakness  . Urge incontinence   . Uterine prolapse     Past Surgical History:  Procedure Laterality Date  . BACK SURGERY     x 2  . BELPHAROPTOSIS REPAIR Bilateral 12/14/2017  . CATARACT EXTRACTION    . CATARACT EXTRACTION Bilateral 10/2017  . COLONOSCOPY  2004, 2009, 2014  . FINGER SURGERY    . GUM SURGERY    . GYNECOLOGIC CRYOSURGERY    . HYSTEROSCOPY W/D&C  12/07/2010   with resection of endometrial polyp  . JOINT REPLACEMENT    . KNEE ARTHROSCOPY Left 2008  . lip biopsy     done at MD office Fri 11/22/13  . MASS EXCISION Right  08/15/2013   Procedure: RIGHT INDEX EXCISION MASS ;  Surgeon: Tennis Must, MD;  Location: [place];  Service: Orthopedics;  Laterality: Right;  . NASAL SEPTUM SURGERY    . OOPHORECTOMY Right 2000  . SHOULDER ARTHROSCOPY WITH OPEN ROTATOR CUFF REPAIR AND DISTAL CLAVICLE ACROMINECTOMY Right 11/26/2013   Procedure: RIGHT SHOULDER ARTHROSCOPY WITH MINI OPEN ROTATOR CUFF REPAIR AND DISTAL CLAVICLE RESECTION, SUBACROMIAL DECOMPRESSION, POSSIBLE [facility] PATCH.;  Surgeon: [name], MD;  Location: [place];  Service: Orthopedics;  Laterality: Right;  . TOTAL KNEE ARTHROPLASTY Right 03/21/2017  . TOTAL KNEE ARTHROPLASTY Right 03/21/2017   Procedure: RIGHT TOTAL KNEE ARTHROPLASTY;  Surgeon: [name], MD;  Location: [place];  Service: Orthopedics;  Laterality: Right;  . TUBAL LIGATION      Family History  Problem Relation Age of Onset  . Hypertension Mother   . Heart disease Mother   . Diabetes Father   . Hypertension Father   . Heart disease Father   . Stroke Father   . Diabetes Maternal Aunt   . Diabetes Paternal Grandmother   . Hypertension Paternal Grandfather   . Stroke Paternal Grandfather    Social History:  reports that she is a non-smoker but has been exposed to tobacco smoke. She  has never used smokeless tobacco. She reports previous alcohol use. She reports that she does not use drugs.  Allergies:  Allergies  Allergen Reactions  . Eggs Or Egg-Derived Products Other (See Comments)    GI UPSET, JOINT PAIN, DIFF. SWALLOWING  . Etodolac Other (See Comments)    ABD. CRAMPING  . Fish-Derived Products Other (See Comments)    GI UPSET, JOINT PAIN, DIFF. SWALLOWING   . Omeprazole Other (See Comments)    ABD. CRAMPING  . Pineapple Other (See Comments)    GI UPSET, JOINT PAIN, DIFF. SWALLOWING  . Gluten Meal Diarrhea    GI upset    Medications Prior to Admission  Medication Sig Dispense Refill  . acetaminophen (TYLENOL) 500 MG tablet Take 1,000 mg by  mouth every 8 (eight) hours as needed for mild pain.    . Ascorbic Acid (VITAMIN C) 1000 MG tablet Take 1,000 mg by mouth daily.    Marland Kitchen aspirin 81 MG EC tablet Take 81 mg by mouth 2 (two) times daily.     Marland Kitchen atenolol-chlorthalidone (TENORETIC) 50-25 MG tablet Take 1 tablet by mouth daily. 90 tablet 3  . b complex vitamins tablet Take 1 tablet by mouth daily.    . Calcium Carbonate-Vitamin D (CALCIUM + D PO) Take 1 tablet by mouth daily.     . Cholecalciferol (D 5000) 5000 units capsule Take 5,000 Units by mouth daily.    . Chromium 1 MG CAPS Take 1 mg by mouth daily.     . Coenzyme Q10 (COQ10) 100 MG CAPS Take 100 mg by mouth every evening.    . diphenoxylate-atropine (LOMOTIL) 2.5-0.025 MG tablet TAKE 1 TABLET BY MOUTH 4 TIMES DAILY AS NEEDED FOR DIARRHEA OR  LOOSE  STOOLS (Patient taking differently: Take 1 tablet by mouth 4 (four) times daily as needed for diarrhea or loose stools. TAKE 1 TABLET BY MOUTH 4 TIMES DAILY AS NEEDED FOR DIARRHEA OR LOOSE STOOLS) 30 tablet 0  . folic acid (FOLVITE) 009 MCG tablet Take 800 mcg by mouth every evening.    Marland Kitchen lisinopril (PRINIVIL,ZESTRIL) 2.5 MG tablet Take 1 tablet (2.5 mg total) by mouth daily. 90 tablet 3  . Magnesium 250 MG TABS Take 250 mg by mouth 2 (two) times daily.    . Melatonin 3 MG TABS Take 3 mg by mouth at bedtime.    . Multiple Vitamin (MULTIVITAMIN) tablet Take 1 tablet by mouth daily.      . potassium chloride SA (KLOR-CON M20) 20 MEQ tablet Take 1 tablet (20 mEq total) by mouth 2 (two) times daily. 180 tablet 3  . Probiotic Product (PROBIOTIC DAILY PO) Take 1 capsule by mouth daily.    Marland Kitchen pyridOXINE (VITAMIN B-6) 100 MG tablet Take 100 mg by mouth every evening.     . simvastatin (ZOCOR) 20 MG tablet Take 1 tablet (20 mg total) by mouth daily. 90 tablet 3  . vitamin E 400 UNIT capsule Take 400 Units by mouth daily.    . Zinc Sulfate (ZINC 15 PO) Take 1 tablet by mouth daily. Liquid Blend 10 drops equal 15 mg    . hyoscyamine (LEVSIN SL)  0.125 MG SL tablet Take one tablet by mouth before meals three times a day (Patient taking differently: Take 0.125 mg by mouth 3 (three) times daily as needed for cramping. Take one tablet by mouth before meals three times a day as needed) 90 tablet 11  . ketotifen (ZADITOR) 0.025 % ophthalmic solution Place 2 drops into both  eyes as needed (allergies).      No results found for this or any previous visit (from the past 48 hour(s)). No results found.  ROS All systems reviewed are negative as they relate to the chief complaint within the history of present illness.  Patient denies  fevers or chills.   Blood pressure 114/64, pulse 73, temperature 98 F (36.7 C), temperature source Oral, resp. rate 19, height 5' 2"  (1.575 m), weight 85.6 kg, last menstrual period 02/09/2005, SpO2 97 %. Physical Exam  HEENT atraumatic heart rate normal Neck range of motion full Affect normal Pupils equal reactive to light Radial pulses palpable Left shoulder demonstrates forward flexion and abduction both to about 50 or 60 degrees.  Rotator cuff weakness is present infraspinatus and supraspinatus testing Assessment/Plan Impression is left shoulder rotator cuff arthropathy.  Plan is left shoulder reverse shoulder replacement.  Risk benefits are discussed including not limited to infection nerve vessel damage dislocation potential need for revision as well as the recovery that is involved all questions answered  Anderson Malta, MD 12/11/2018, 7:20 AM

## 2018-12-11 NOTE — Op Note (Signed)
NAME: [name], [name] MEDICAL RECORD AT:[id number] ACCOUNT [id number] DATE OF BIRTH:1950/10/31 FACILITY: [facility] LOCATION: [facility] PHYSICIAN:GREGORY Randel Pigg, MD  OPERATIVE REPORT  DATE OF PROCEDURE:  12/11/2018  PREOPERATIVE DIAGNOSIS:  Left shoulder rotator cuff arthropathy.  POSTOPERATIVE DIAGNOSIS:  Left shoulder rotator cuff arthropathy.  PROCEDURE:  Left reverse shoulder replacement utilizing Biomet comprehensive reverse shoulder system with small augmented baseplate, 36 standard glenosphere with 1 central compression screw and 4 peripheral locking screws matched with mini-humeral stem 9  x 83 mm with a standard bearing 36 mm diameter and mini-humeral tray, standard thickness 40 mm diameter +6 offset.  SURGEON:  Meredith Pel, MD  ASSISTANT:   Annie Main, PA-C   INDICATIONS:  This is a 68 year old patient with left shoulder rotator cuff arthropathy who presents for operative management after explanation of risks and benefits and failure of conservative management.  PROCEDURE IN DETAIL:  The patient was brought to the operating room where general anesthetic was induced.  Preoperative antibiotics were administered.  Time-out was called.  Left shoulder prescrubbed with hydrogen peroxide, alcohol, and then Betadine,  which was allowed to air dry, then prepped with ChloraPrep solution and draped in a sterile manner.  [name] was used to cover the entire operative field.  A deltopectoral approach was made.  Skin and subcutaneous tissue were sharply divided.  Cephalic  vein mobilized medially.  Avulsion of the branch distally required ligation distally of the vein.  The interval between the deltoid and pectoralis muscle was developed.  The biceps tendon had already auto tenodesed.  Rotator interval was then opened.   The patient did not have a supraspinatus or infraspinatus tendon.  Subscap was intact.  The circumflex vessels were ligated.  Axillary nerve was visualized and a vessel loop  placed around it.  It was protected at all times during the remaining portion of  the case.  Subscap was detached along with the capsule around the inferior neck back to the 7 o'clock position and 2 cm distal to the inferior neck.  The head was then dislocated.  Reaming and broaching was then performed up to a size 9.  A 7 broach  left in place with cap.  Attention then directed towards the glenoid.  The deltoid was elevated partially manually off of its attachment site.  Rotator interval and coracohumeral ligament released to the base of the coracoid.  At this time, a posterior  retractor and anterior retractor were placed.  A 360-degree circumferential release was performed with care being taken to avoid injury to the axillary nerve.  A good release was performed.  A guide was placed on the glenoid.  Reaming was then performed  which gave about a 50% inferior ream 5 mm deep.  Small augment superiorly was then performed with reaming superiorly which also gave excellent bleeding bone.  The baseplate was then placed with excellent purchase and a central screw and excellent fit on  the bone.  The 4 peripheral locking screws were then placed also.  Standard 36 glenosphere was placed with about 1.5 mm inferior offset.  At this time, the neck cut which was made in 30 degrees of retroversion was revisited.  Calcar planing was  performed.  Broaching up to size 9 was performed which gave excellent press-fit.  Trial reduction was then performed with the standard 36 bearing and a +6 offset humeral tray.  This gave excellent stability to extension, forward flexion, and superior  motion of the shoulder joint.  Also excellent stability to  internal and external rotation.  The patient had over 90 degrees of forward flexion and abduction with good stability.  Trial components were removed on the humeral side.  True components placed.   Subscap drill holes were placed, and the subscap was repaired after placement of the  implant.  Subscap repair was performed with the arm in about 45 degrees of external rotation.  Axillary nerve was again palpated.  It was intact and that vessel loop  was removed.  Thorough irrigation performed with 3 L of irrigating solution along with the Irricept, which was used throughout the case.  Vancomycin powder was then placed around the implant.  The deltopectoral interval was then closed using #1 Vicryl  suture followed by vancomycin powder and 0 Vicryl suture, 2-0 Vicryl suture, and 3-0 Monocryl.  Steri-Strips and an Aquacel dressing placed.  The patient tolerated the procedure well without immediate complications.  He was transferred to the recovery  room in stable condition.  Luke's assistance was required at all times for retraction, opening and closing.  His assistance was a medical necessity.  LN/NUANCE  D:12/11/2018 T:12/11/2018 JOB:[id number]

## 2018-12-12 ENCOUNTER — Encounter (HOSPITAL_COMMUNITY): Payer: Self-pay | Admitting: General Practice

## 2018-12-12 MED ORDER — OXYCODONE HCL 5 MG PO TABS: 5.0000 mg | ORAL_TABLET | Freq: Three times a day (TID) | ORAL | 0 refills | Status: DC | PRN

## 2018-12-12 MED ORDER — METHOCARBAMOL 500 MG PO TABS: 500.0000 mg | ORAL_TABLET | Freq: Three times a day (TID) | ORAL | 0 refills | Status: DC | PRN

## 2018-12-12 NOTE — Progress Notes (Signed)
  Subjective: [name] is a 68 y.o. female s/p Left RSA.  They are POD1.  Pt's pain is under control.  Pt's block is still in effect.  Pt requests discharge home today.       Objective: Vital signs in last 24 hours: Temp:  [97 F (36.1 C)-97.7 F (36.5 C)] 97.7 F (36.5 C) (09/02 0445) Pulse Rate:  [54-84] 83 (09/02 0445) Resp:  [11-22] 18 (09/02 0007) BP: (94-152)/(60-96) 99/60 (09/02 0445) SpO2:  [93 %-100 %] 93 % (09/02 0445)  Intake/Output from previous day: 09/01 0701 - 09/02 0700 In: 3774.6 [P.O.:1920; I.V.:1554.6; IV Piggyback:300] Out: 102 [Urine:2; Blood:100] Intake/Output this shift: No intake/output data recorded.  Exam:  No gross blood or drainage overlying the dressing 2+ radial pulse Numbness of the left hand  Able to extend left wrist   Labs: No results for input(s): HGB in the last 72 hours. No results for input(s): WBC, RBC, HCT, PLT in the last 72 hours. No results for input(s): NA, K, CL, CO2, BUN, CREATININE, GLUCOSE, CALCIUM in the last 72 hours. No results for input(s): LABPT, INR in the last 72 hours.  Assessment/Plan: Pt is POD1 s/p left RSA.    -Plan to discharge to home today or tomorrow pending patient's pain  -No lifting with the left arm   -Use the CPM machine at least 3 times per day for one hour each time, increasing the degrees daily.     Konstantina Nachreiner L Ardel Jagger 12/12/2018, 7:40 AM

## 2018-12-12 NOTE — Plan of Care (Signed)
  Problem: Safety: Goal: Ability to remain free from injury will improve Outcome: Progressing   Problem: Activity: Goal: Risk for activity intolerance will decrease Outcome: Progressing   Problem: Coping: Goal: Level of anxiety will decrease Outcome: Progressing   Problem: Pain Managment: Goal: General experience of comfort will improve Outcome: Progressing   Problem: Safety: Goal: Ability to remain free from injury will improve Outcome: Progressing

## 2018-12-12 NOTE — Progress Notes (Signed)
Physical Therapy Treatment Patient Details Name: [name] MRN: [id number] DOB: [birth year] Today's Date: 12/12/2018    History of Present Illness Pt is a 68 y/o female s/p L reverse TSA. PMH includes R TKA, HTN, osteopenia, and back surgery.     PT Comments    Pt performed gt training 200 ft with Independence.  She is performing all transfers independent from chair, WC and into her car.  Pt performed stair training with mild instability due to weak R knee.  Pt required cues for cane placement and sequencing.  She is progressing well and appears to be back to baseline post surgery minus her L shoulder.  Pt d/c'd after stair training session.     Follow Up Recommendations  No PT follow up     Equipment Recommendations  None recommended by PT    Recommendations for Other Services       Precautions / Restrictions Precautions Precautions: Fall;Shoulder Shoulder Interventions: Shoulder sling/immobilizer Restrictions Weight Bearing Restrictions: No LUE Weight Bearing: Non weight bearing    Mobility  Bed Mobility               General bed mobility comments: Standing in room ambulating about without device  Transfers Overall transfer level: Modified independent Equipment used: None Transfers: Sit to/from Stand Sit to Stand: Modified independent (Device/Increase time)            Ambulation/Gait Ambulation/Gait assistance: Modified independent (Device/Increase time) Gait Distance (Feet): 150 Feet Assistive device: None Gait Pattern/deviations: Step-through pattern;Decreased stride length Gait velocity: decreased   General Gait Details: Slow and guarded but no physical assistance needed.   Stairs Stairs: Yes Stairs assistance: Min guard Stair Management: No rails;With cane Number of Stairs: 3 General stair comments: Cues for sequencing and placement of cane.   Wheelchair Mobility    Modified Rankin (Stroke Patients Only)       Balance                                             Cognition Arousal/Alertness: Awake/alert Behavior During Therapy: WFL for tasks assessed/performed Overall Cognitive Status: Within Functional Limits for tasks assessed                                        Exercises      General Comments        Pertinent Vitals/Pain Pain Assessment: No/denies pain    Home Living                      Prior Function            PT Goals (current goals can now be found in the care plan section) Acute Rehab PT Goals Patient Stated Goal: to go home Potential to Achieve Goals: Good Progress towards PT goals: Progressing toward goals    Frequency    Min 5X/week      PT Plan Current plan remains appropriate    Co-evaluation              AM-PAC PT "6 Clicks" Mobility   Outcome Measure  Help needed turning from your back to your side while in a flat bed without using bedrails?: None Help needed moving from lying on your back to sitting on the side  of a flat bed without using bedrails?: None Help needed moving to and from a bed to a chair (including a wheelchair)?: None Help needed standing up from a chair using your arms (e.g., wheelchair or bedside chair)?: None Help needed to walk in hospital room?: None Help needed climbing 3-5 steps with a railing? : A Little 6 Click Score: 23    End of Session Equipment Utilized During Treatment: Gait belt Activity Tolerance: Patient tolerated treatment well Patient left: (assisted patient into her passenger's seat of car to d/c home with husband) Nurse Communication: Other (comment)(informed nursing of d/c) PT Visit Diagnosis: Unsteadiness on feet (R26.81);Muscle weakness (generalized) (M62.81)     Time: 4356-8616 PT Time Calculation (min) (ACUTE ONLY): 9 min  Charges:  $Gait Training: 8-22 mins                     Governor Rooks, PTA Acute Rehabilitation Services Pager [phone] Office  [phone]     [name] 12/12/2018, 2:46 PM

## 2018-12-12 NOTE — Progress Notes (Signed)
Pt stable Block just starting to wear off Plan dc today cpm 1 hour 3 x a day

## 2018-12-12 NOTE — Evaluation (Signed)
Occupational Therapy Evaluation Patient Details Name: [name] MRN: [id number] DOB: 08-26-1950 Today's Date: 12/12/2018    History of Present Illness Pt is a 68 y/o female s/p L reverse TSA. PMH includes R TKA, HTN, osteopenia, and back surgery.    Clinical Impression   Pt admitted with the above diagnoses and presents with below problem list. PTA pt was independent with ADLs. Pt is currently min guard with functional mobility, min guard to min A with toilet transfers and pericare, min A with LB ADLs, mod A with UB ADLs. Pt reports family able to assist at home. No further acute OT needs indicated, all shoulder education complete. Pt is for d/c home today. OT signing off.     Follow Up Recommendations  Follow surgeon's recommendation for DC plan and follow-up therapies    Equipment Recommendations  None recommended by OT    Recommendations for Other Services       Precautions / Restrictions Precautions Precautions: Fall;Shoulder Shoulder Interventions: Shoulder sling/immobilizer Restrictions Weight Bearing Restrictions: No LUE Weight Bearing: Non weight bearing      Mobility Bed Mobility               General bed mobility comments: up in chair  Transfers Overall transfer level: Needs assistance Equipment used: None Transfers: Sit to/from Stand Sit to Stand: Min guard;Min assist         General transfer comment: Min A for steadying assist to stand.     Balance Overall balance assessment: Needs assistance Sitting-balance support: No upper extremity supported;Feet supported Sitting balance-[name] Scale: Good     Standing balance support: No upper extremity supported;During functional activity Standing balance-[name] Scale: Fair                             ADL either performed or assessed with clinical judgement   ADL Overall ADL's : Needs assistance/impaired Eating/Feeding: Set up;Sitting   Grooming: Set up;Sitting   Upper Body  Bathing: Moderate assistance;Sitting   Lower Body Bathing: Minimal assistance;Sit to/from stand;Min guard   Upper Body Dressing : Moderate assistance;Sitting   Lower Body Dressing: Minimal assistance;Sit to/from stand   Toilet Transfer: Min guard;Ambulation   Toileting- Clothing Manipulation and Hygiene: Min guard;Minimal assistance;Sit to/from stand   Tub/ Shower Transfer: Min guard;Walk-in shower;3 in 1;Grab bars   Functional mobility during ADLs: Min guard General ADL Comments: UB/LB bathing and dressing completed during session. Walked around in room min guard. Shoulder education completed     Vision Baseline Vision/History: Wears glasses       Perception     Praxis      Pertinent Vitals/Pain Pain Assessment: No/denies pain     Hand Dominance Right   Extremity/Trunk Assessment Upper Extremity Assessment Upper Extremity Assessment: LUE deficits/detail LUE Deficits / Details: expected deficits s/p L shoulder surgery, nerve block largely in effect. AROM digits and some in wrist.   Lower Extremity Assessment Lower Extremity Assessment: Defer to PT evaluation;Generalized weakness       Communication Communication Communication: No difficulties   Cognition Arousal/Alertness: Awake/alert Behavior During Therapy: WFL for tasks assessed/performed Overall Cognitive Status: Within Functional Limits for tasks assessed                                     General Comments       Exercises     Shoulder Instructions  Home Living Family/patient expects to be discharged to:: Private residence Living Arrangements: Spouse/significant other Available Help at Discharge: Family;Available 24 hours/day Type of Home: House Home Access: Stairs to enter [employer] of Steps: 2 Entrance Stairs-Rails: None Home Layout: One level     Bathroom Shower/Tub: Occupational psychologist: Standard     Home Equipment: Cane - single  point;Shower seat          Prior Functioning/Environment Level of Independence: Independent with assistive device(s)        Comments: Was independent with ambulation, however, used cane for stair navigation.         OT Problem List: Impaired balance (sitting and/or standing);Decreased knowledge of use of DME or AE;Decreased knowledge of precautions;Impaired UE functional use;Pain      OT Treatment/Interventions:      OT Goals(Current goals can be found in the care plan section) Acute Rehab OT Goals Patient Stated Goal: to go home  OT Frequency:     Barriers to D/C:            Co-evaluation              AM-PAC OT "6 Clicks" Daily Activity     Outcome Measure Help from another person eating meals?: None Help from another person taking care of personal grooming?: A Little Help from another person toileting, which includes using toliet, bedpan, or urinal?: A Little Help from another person bathing (including washing, rinsing, drying)?: A Little Help from another person to put on and taking off regular upper body clothing?: A Little Help from another person to put on and taking off regular lower body clothing?: A Little 6 Click Score: 19   End of Session Equipment Utilized During Treatment: Other (comment)(sling)  Activity Tolerance: Patient tolerated treatment well Patient left: in chair;with call bell/phone within reach;with nursing/sitter in room  OT Visit Diagnosis: Unsteadiness on feet (R26.81);Pain;Muscle weakness (generalized) (M62.81)                Time: 7564-3329 OT Time Calculation (min): 29 min Charges:  OT General Charges $OT Visit: 1 Visit OT Evaluation $OT Eval Low Complexity: 1 Low OT Treatments $Self Care/Home Management : 8-22 mins  Tyrone Schimke, OT Acute Rehabilitation Services Pager: [phone] Office: [phone]   Hortencia Pilar 12/12/2018, 10:30 AM

## 2018-12-14 ENCOUNTER — Telehealth: Payer: Self-pay | Admitting: Orthopedic Surgery

## 2018-12-14 DIAGNOSIS — K449 Diaphragmatic hernia without obstruction or gangrene: Secondary | ICD-10-CM | POA: Diagnosis not present

## 2018-12-14 DIAGNOSIS — M19041 Primary osteoarthritis, right hand: Secondary | ICD-10-CM | POA: Diagnosis not present

## 2018-12-14 DIAGNOSIS — I69354 Hemiplegia and hemiparesis following cerebral infarction affecting left non-dominant side: Secondary | ICD-10-CM | POA: Diagnosis not present

## 2018-12-14 DIAGNOSIS — K5790 Diverticulosis of intestine, part unspecified, without perforation or abscess without bleeding: Secondary | ICD-10-CM | POA: Diagnosis not present

## 2018-12-14 DIAGNOSIS — I10 Essential (primary) hypertension: Secondary | ICD-10-CM | POA: Diagnosis not present

## 2018-12-14 DIAGNOSIS — E7800 Pure hypercholesterolemia, unspecified: Secondary | ICD-10-CM | POA: Diagnosis not present

## 2018-12-14 DIAGNOSIS — Z96612 Presence of left artificial shoulder joint: Secondary | ICD-10-CM | POA: Diagnosis not present

## 2018-12-14 DIAGNOSIS — M47819 Spondylosis without myelopathy or radiculopathy, site unspecified: Secondary | ICD-10-CM | POA: Diagnosis not present

## 2018-12-14 DIAGNOSIS — M19042 Primary osteoarthritis, left hand: Secondary | ICD-10-CM | POA: Diagnosis not present

## 2018-12-14 DIAGNOSIS — M8580 Other specified disorders of bone density and structure, unspecified site: Secondary | ICD-10-CM | POA: Diagnosis not present

## 2018-12-14 DIAGNOSIS — J302 Other seasonal allergic rhinitis: Secondary | ICD-10-CM | POA: Diagnosis not present

## 2018-12-14 DIAGNOSIS — Z9181 History of falling: Secondary | ICD-10-CM | POA: Diagnosis not present

## 2018-12-14 DIAGNOSIS — Z86010 Personal history of colonic polyps: Secondary | ICD-10-CM | POA: Diagnosis not present

## 2018-12-14 DIAGNOSIS — Z96651 Presence of right artificial knee joint: Secondary | ICD-10-CM | POA: Diagnosis not present

## 2018-12-14 DIAGNOSIS — Z471 Aftercare following joint replacement surgery: Secondary | ICD-10-CM | POA: Diagnosis not present

## 2018-12-14 DIAGNOSIS — N3941 Urge incontinence: Secondary | ICD-10-CM | POA: Diagnosis not present

## 2018-12-14 DIAGNOSIS — K219 Gastro-esophageal reflux disease without esophagitis: Secondary | ICD-10-CM | POA: Diagnosis not present

## 2018-12-14 NOTE — Telephone Encounter (Signed)
[name] from [facility] care. Would like verbal orders for patient to have PT for 1x wk for 2wks. Thanks.

## 2018-12-15 NOTE — Discharge Summary (Signed)
Physician Discharge Summary      Patient ID: [name] MRN: [id number] DOB/AGE: 1950/08/19 68 y.o.  Admit date: 12/11/2018 Discharge date: 12/12/2018  Admission Diagnoses:  Active Problems:   OA (osteoarthritis) of shoulder   Discharge Diagnoses:  Same  Surgeries: Procedure(s): LEFT REVERSE SHOULDER ARTHROPLASTY on 12/11/2018   Consultants:   Discharged Condition: Stable  Hospital Course: [name] is an 68 y.o. female who was admitted 12/11/2018 with a chief complaint of Left shoulder pain, and found to have a diagnosis of Left shoulder OA.  They were brought to the operating room on 12/11/2018 and underwent the above named procedures.  Pt awoke from anesthesia without complication.  After recovering from anesthesia, patient was transferred to 5N.  On POD1, patient was doing well and her pain was well-controlled.  She was discharged home on POD1 and will f/u with Dr. Marlou Sa in the clinic in 2 weeks.   Antibiotics given:  Anti-infectives (From admission, onward)   Start     Dose/Rate Route Frequency Ordered Stop   12/11/18 1430  ceFAZolin (ANCEF) IVPB 2g/100 mL premix     2 g 200 mL/hr over 30 Minutes Intravenous Every 6 hours 12/11/18 1345 12/12/18 0421   12/11/18 0809  vancomycin (VANCOCIN) powder  Status:  Discontinued       As needed 12/11/18 0810 12/11/18 1041   12/11/18 0600  ceFAZolin (ANCEF) IVPB 2g/100 mL premix     2 g 200 mL/hr over 30 Minutes Intravenous On call to O.R. 12/11/18 1937 12/11/18 0739    .  Recent vital signs:  Vitals:   12/12/18 0445 12/12/18 0824  BP: 99/60 101/60  Pulse: 83 74  Resp:  16  Temp: 97.7 F (36.5 C) 98.1 F (36.7 C)  SpO2: 93% 95%    Recent laboratory studies:  Results for orders placed or performed during the hospital encounter of 12/07/18  SARS CORONAVIRUS 2 (TAT 6-12 HRS) Nasal Swab Aptima Multi Swab   Specimen: Aptima Multi Swab; Nasal Swab  Result Value Ref Range   SARS Coronavirus 2 NEGATIVE NEGATIVE     Discharge Medications:   Allergies as of 12/12/2018      Reactions   Eggs Or Egg-derived Products Other (See Comments)   GI UPSET, JOINT PAIN, DIFF. SWALLOWING   Etodolac Other (See Comments)   ABD. CRAMPING   Fish-derived Products Other (See Comments)   GI UPSET, JOINT PAIN, DIFF. SWALLOWING    Omeprazole Other (See Comments)   ABD. CRAMPING   Pineapple Other (See Comments)   GI UPSET, JOINT PAIN, DIFF. SWALLOWING   Gluten Meal Diarrhea   GI upset      Medication List    STOP taking these medications   acetaminophen 500 MG tablet Commonly known as: TYLENOL     TAKE these medications   aspirin 81 MG EC tablet Take 81 mg by mouth 2 (two) times daily.   atenolol-chlorthalidone 50-25 MG tablet Commonly known as: TENORETIC Take 1 tablet by mouth daily.   b complex vitamins tablet Take 1 tablet by mouth daily.   CALCIUM + D PO Take 1 tablet by mouth daily.   Chromium 1 MG Caps Take 1 mg by mouth daily.   CoQ10 100 MG Caps Take 100 mg by mouth every evening.   D 5000 125 MCG (5000 UT) capsule Generic drug: Cholecalciferol Take 5,000 Units by mouth daily.   diphenoxylate-atropine 2.5-0.025 MG tablet Commonly known as: LOMOTIL TAKE 1 TABLET BY MOUTH 4 TIMES DAILY AS  NEEDED FOR DIARRHEA OR  LOOSE  STOOLS What changed: See the new instructions.   folic acid 492 MCG tablet Commonly known as: FOLVITE Take 800 mcg by mouth every evening.   hyoscyamine 0.125 MG SL tablet Commonly known as: LEVSIN SL Take one tablet by mouth before meals three times a day What changed:   how much to take  how to take this  when to take this  reasons to take this  additional instructions   ketotifen 0.025 % ophthalmic solution Commonly known as: ZADITOR Place 2 drops into both eyes as needed (allergies).   lisinopril 2.5 MG tablet Commonly known as: ZESTRIL Take 1 tablet (2.5 mg total) by mouth daily.   Magnesium 250 MG Tabs Take 250 mg by mouth 2 (two) times  daily.   Melatonin 3 MG Tabs Take 3 mg by mouth at bedtime.   methocarbamol 500 MG tablet Commonly known as: Robaxin Take 1 tablet (500 mg total) by mouth every 8 (eight) hours as needed for muscle spasms.   multivitamin tablet Take 1 tablet by mouth daily.   oxyCODONE 5 MG immediate release tablet Commonly known as: Roxicodone Take 1 tablet (5 mg total) by mouth every 8 (eight) hours as needed.   potassium chloride SA 20 MEQ tablet Commonly known as: Klor-Con M20 Take 1 tablet (20 mEq total) by mouth 2 (two) times daily.   PROBIOTIC DAILY PO Take 1 capsule by mouth daily.   pyridOXINE 100 MG tablet Commonly known as: VITAMIN B-6 Take 100 mg by mouth every evening.   simvastatin 20 MG tablet Commonly known as: ZOCOR Take 1 tablet (20 mg total) by mouth daily.   vitamin C 1000 MG tablet Take 1,000 mg by mouth daily.   vitamin E 400 UNIT capsule Take 400 Units by mouth daily.   ZINC 15 PO Take 1 tablet by mouth daily. Liquid Blend 10 drops equal 15 mg       Diagnostic Studies: Dg Shoulder Left Port  Result Date: 12/11/2018 CLINICAL DATA:  Osteoarthritis of the left shoulder. Status post left reverse shoulder arthroplasty. EXAM: LEFT SHOULDER - 1 VIEW COMPARISON:  Radiographs dated 08/23/2018 FINDINGS: Single AP portable view of the left shoulder demonstrates the prosthetic components appearing to be in good position in this single view. No fracture. Minimal degenerative changes of the [facility] joint. IMPRESSION: Satisfactory appearance of the left shoulder after left shoulder prosthesis insertion. Electronically Signed   By: Lorriane Shire M.D.   On: 12/11/2018 12:58    Disposition:   Discharge Instructions    Call MD / Call 911   Complete by: As directed    If you experience chest pain or shortness of breath, CALL 911 and be transported to the hospital emergency room.  If you develope a fever above 101 F, pus (white drainage) or increased drainage or redness at the wound,  or calf pain, call your surgeon's office.   Call MD / Call 911   Complete by: As directed    If you experience chest pain or shortness of breath, CALL 911 and be transported to the hospital emergency room.  If you develope a fever above 101 F, pus (white drainage) or increased drainage or redness at the wound, or calf pain, call your surgeon's office.   Constipation Prevention   Complete by: As directed    Drink plenty of fluids.  Prune juice may be helpful.  You may use a stool softener, such as Colace (over the counter) 100 mg  twice a day.  Use MiraLax (over the counter) for constipation as needed.   Constipation Prevention   Complete by: As directed    Drink plenty of fluids.  Prune juice may be helpful.  You may use a stool softener, such as Colace (over the counter) 100 mg twice a day.  Use MiraLax (over the counter) for constipation as needed.   Diet - low sodium heart healthy   Complete by: As directed    Diet - low sodium heart healthy   Complete by: As directed    Discharge instructions   Complete by: As directed    You may shower, dressing is waterproof.  Do not bathe or soak the operative shoulder in a tub, pool.  Use the CPM machine 3 times a day for 1-1.5 hours each time.  No lifting with the operative shoulder. Continue use of the sling.  Follow-up with Dr. Marlou Sa in ~2 weeks on your given appointment date.  We will remove your adhesive bandage at that time.   Discharge instructions   Complete by: As directed    No lifting with left arm CPM machine 1 hour 3 times a day increase degrees daily Okay to shower dressing waterproof Use sling for comfort Okay to ice the shoulder 20 minutes after each CPM session   Increase activity slowly as tolerated   Complete by: As directed    Increase activity slowly as tolerated   Complete by: As directed          Signed: Donella Stade 12/15/2018, 12:13 PM

## 2018-12-18 ENCOUNTER — Telehealth: Payer: Self-pay

## 2018-12-18 DIAGNOSIS — I10 Essential (primary) hypertension: Secondary | ICD-10-CM | POA: Diagnosis not present

## 2018-12-18 DIAGNOSIS — Z471 Aftercare following joint replacement surgery: Secondary | ICD-10-CM | POA: Diagnosis not present

## 2018-12-18 DIAGNOSIS — M47819 Spondylosis without myelopathy or radiculopathy, site unspecified: Secondary | ICD-10-CM | POA: Diagnosis not present

## 2018-12-18 DIAGNOSIS — K219 Gastro-esophageal reflux disease without esophagitis: Secondary | ICD-10-CM | POA: Diagnosis not present

## 2018-12-18 DIAGNOSIS — K5790 Diverticulosis of intestine, part unspecified, without perforation or abscess without bleeding: Secondary | ICD-10-CM | POA: Diagnosis not present

## 2018-12-18 DIAGNOSIS — I69354 Hemiplegia and hemiparesis following cerebral infarction affecting left non-dominant side: Secondary | ICD-10-CM | POA: Diagnosis not present

## 2018-12-18 NOTE — Telephone Encounter (Signed)
A and prom as tolerated no lifting til rov

## 2018-12-18 NOTE — Telephone Encounter (Signed)
Notified therapist.

## 2018-12-18 NOTE — Telephone Encounter (Signed)
Can you please advise what HHPT needs to work on. Please advise what restrictions

## 2018-12-18 NOTE — Telephone Encounter (Signed)
IC no answer VM given with verbals

## 2018-12-20 DIAGNOSIS — Z471 Aftercare following joint replacement surgery: Secondary | ICD-10-CM | POA: Diagnosis not present

## 2018-12-20 DIAGNOSIS — I10 Essential (primary) hypertension: Secondary | ICD-10-CM | POA: Diagnosis not present

## 2018-12-20 DIAGNOSIS — I69354 Hemiplegia and hemiparesis following cerebral infarction affecting left non-dominant side: Secondary | ICD-10-CM | POA: Diagnosis not present

## 2018-12-20 DIAGNOSIS — K5790 Diverticulosis of intestine, part unspecified, without perforation or abscess without bleeding: Secondary | ICD-10-CM | POA: Diagnosis not present

## 2018-12-20 DIAGNOSIS — M47819 Spondylosis without myelopathy or radiculopathy, site unspecified: Secondary | ICD-10-CM | POA: Diagnosis not present

## 2018-12-20 DIAGNOSIS — K219 Gastro-esophageal reflux disease without esophagitis: Secondary | ICD-10-CM | POA: Diagnosis not present

## 2018-12-24 ENCOUNTER — Other Ambulatory Visit: Payer: Self-pay | Admitting: Orthopedic Surgery

## 2018-12-24 DIAGNOSIS — I69354 Hemiplegia and hemiparesis following cerebral infarction affecting left non-dominant side: Secondary | ICD-10-CM | POA: Diagnosis not present

## 2018-12-24 DIAGNOSIS — Z471 Aftercare following joint replacement surgery: Secondary | ICD-10-CM | POA: Diagnosis not present

## 2018-12-24 DIAGNOSIS — M47819 Spondylosis without myelopathy or radiculopathy, site unspecified: Secondary | ICD-10-CM | POA: Diagnosis not present

## 2018-12-24 DIAGNOSIS — K5790 Diverticulosis of intestine, part unspecified, without perforation or abscess without bleeding: Secondary | ICD-10-CM | POA: Diagnosis not present

## 2018-12-24 DIAGNOSIS — I10 Essential (primary) hypertension: Secondary | ICD-10-CM | POA: Diagnosis not present

## 2018-12-24 DIAGNOSIS — K219 Gastro-esophageal reflux disease without esophagitis: Secondary | ICD-10-CM | POA: Diagnosis not present

## 2018-12-24 NOTE — Telephone Encounter (Signed)
Ok to rf pls c lal thx

## 2018-12-24 NOTE — Telephone Encounter (Signed)
Ok to rf? 

## 2018-12-25 NOTE — Telephone Encounter (Signed)
Do you want to refill? 

## 2018-12-26 ENCOUNTER — Encounter: Payer: Self-pay | Admitting: Orthopedic Surgery

## 2018-12-26 ENCOUNTER — Ambulatory Visit (INDEPENDENT_AMBULATORY_CARE_PROVIDER_SITE_OTHER): Payer: Medicare Other

## 2018-12-26 ENCOUNTER — Ambulatory Visit (INDEPENDENT_AMBULATORY_CARE_PROVIDER_SITE_OTHER): Payer: Medicare Other | Admitting: Orthopedic Surgery

## 2018-12-26 DIAGNOSIS — Z96612 Presence of left artificial shoulder joint: Secondary | ICD-10-CM | POA: Diagnosis not present

## 2018-12-26 NOTE — Progress Notes (Signed)
Post-Op Visit Note   Patient: [name]           Date of Birth: [birth year]           MRN: [id number] Visit Date: 12/26/2018 PCP: Hoyt Koch, MD   Assessment & Plan:  Chief Complaint:  Chief Complaint  Patient presents with  . Left Shoulder - Routine Post Op   Visit Diagnoses:  1. Status post reverse total replacement of left shoulder     Plan: Mieka is a patient is now about 2 weeks out left reverse shoulder replacement.  CPM 93.  On exam she has functional axillary nerve function and good passive range of motion.  On her to [facility] health PT and start outpatient PT 2 times a week for 6 weeks for active assisted range of motion as well as passive range of motion functional activity but no lifting more than 5 pounds with that right arm for the next 4 weeks.  Okay to discontinue the sling.  She is taking Tylenol for pain.  Come back in 4 weeks for clinical recheck.  Follow-Up Instructions: Return in about 4 weeks (around 01/23/2019).   Orders:  Orders Placed This Encounter  Procedures  . XR Shoulder Left  . Ambulatory referral to Physical Therapy   No orders of the defined types were placed in this encounter.   Imaging: Xr Shoulder Left  Result Date: 12/26/2018 AP outlet axillary left shoulder reviewed.  Reverse prosthesis in good position alignment with no complicating features.  Shoulder is located.   PMFS History: Patient Active Problem List   Diagnosis Date Noted  . 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 Chronic left shoulder pain 08/23/2018  . Dysuria 08/06/2018  . History of total right knee replacement 03/21/2017  . Chronic diarrhea 12/07/2014  . Segmental colitis with rectal bleeding ([place]) 09/12/2014  . Routine general medical examination at a health care facility 06/05/2014  . Overweight 06/13/2011  . ABNORMAL CHEST XRAY 09/30/2007  . Hyperlipidemia 02/17/2007  . Essential hypertension 01/08/2007  . Osteoarthritis 01/08/2007    Past Medical History:  Diagnosis Date  . Arthritis    back, fingers with joint pain and swelling.  chronic back pain  . Chronic back pain    arthritis   . Diverticulosis   . Elevated cholesterol    takes Niacin daily and Simvastatin  . GERD (gastroesophageal reflux disease) 06/2004   non-specific gastritis on EGD 06/2004  . Headache(784.0)    occasionally  . History of colon polyps 2004, 2009   2004:adenomatous. 2009 hyperplastic.   Marland Kitchen History of hiatal hernia   . Hypertension    takes Tenoretic and Lisinopril daily  . Mucoid cyst of joint 08/2013   right index finger  . Osteopenia 01/2014   T score -1.1 FRAX 14%/0.5%. Stable from prior DEXA  . PONV (postoperative nausea and vomiting)   . Rotator cuff arthropathy    Left  . Seasonal allergies   . Stroke ([place]) 1998   x 2 - mild left-sided weakness  . Urge incontinence   . Uterine prolapse     Family History  Problem Relation Age of Onset  . Hypertension Mother   . Heart disease Mother   . Diabetes Father   . Hypertension Father   . Heart disease Father   . Stroke Father   . Diabetes Maternal Aunt   . Diabetes Paternal Grandmother   . Hypertension Paternal Grandfather   . Stroke Paternal Grandfather  Past Surgical History:  Procedure Laterality Date  . BACK SURGERY     x 2  . BELPHAROPTOSIS REPAIR Bilateral 12/14/2017  . CATARACT EXTRACTION    . CATARACT EXTRACTION Bilateral 10/2017  . COLONOSCOPY  2004, 2009, 2014  . FINGER SURGERY    . GUM SURGERY    . GYNECOLOGIC CRYOSURGERY    . HYSTEROSCOPY W/D&C  12/07/2010   with resection of endometrial polyp  . JOINT REPLACEMENT    . KNEE ARTHROSCOPY Left 2008  . lip biopsy     done at MD office Fri 11/22/13  . MASS EXCISION Right 08/15/2013   Procedure: RIGHT INDEX EXCISION MASS ;  Surgeon: Tennis Must, MD;  Location: [place];  Service: Orthopedics;  Laterality: Right;  . NASAL SEPTUM SURGERY    . OOPHORECTOMY Right 2000  . REVERSE SHOULDER  ARTHROPLASTY Left 12/11/2018  . REVERSE SHOULDER ARTHROPLASTY Left 12/11/2018   Procedure: LEFT REVERSE SHOULDER ARTHROPLASTY;  Surgeon: Meredith Pel, MD;  Location: [place];  Service: Orthopedics;  Laterality: Left;  . SHOULDER ARTHROSCOPY WITH OPEN ROTATOR CUFF REPAIR AND DISTAL CLAVICLE ACROMINECTOMY Right 11/26/2013   Procedure: RIGHT SHOULDER ARTHROSCOPY WITH MINI OPEN ROTATOR CUFF REPAIR AND DISTAL CLAVICLE RESECTION, SUBACROMIAL DECOMPRESSION, POSSIBLE [facility] PATCH.;  Surgeon: [name], MD;  Location: [place];  Service: Orthopedics;  Laterality: Right;  . TOTAL KNEE ARTHROPLASTY Right 03/21/2017  . TOTAL KNEE ARTHROPLASTY Right 03/21/2017   Procedure: RIGHT TOTAL KNEE ARTHROPLASTY;  Surgeon: [name], MD;  Location: [place];  Service: Orthopedics;  Laterality: Right;  . TUBAL LIGATION     Social History   Occupational History  . Occupation: Animal nutritionist    Comment: Disability/Retired  Tobacco Use  . Smoking status: Passive Smoke Exposure - Never Smoker  . Smokeless tobacco: Never Used  Substance and Sexual Activity  . Alcohol use: Not Currently    Alcohol/week: 0.0 standard drinks    Frequency: Never    Comment: about 2-3 times a year  . Drug use: No  . Sexual activity: Yes    Partners: Male    Birth control/protection: Surgical, Post-menopausal    Comment: BTL-1st intercourse 68 yo-More than 5 partners

## 2018-12-27 ENCOUNTER — Other Ambulatory Visit: Payer: Self-pay | Admitting: Internal Medicine

## 2018-12-27 DIAGNOSIS — I69354 Hemiplegia and hemiparesis following cerebral infarction affecting left non-dominant side: Secondary | ICD-10-CM | POA: Diagnosis not present

## 2018-12-27 DIAGNOSIS — Z471 Aftercare following joint replacement surgery: Secondary | ICD-10-CM | POA: Diagnosis not present

## 2018-12-27 DIAGNOSIS — I10 Essential (primary) hypertension: Secondary | ICD-10-CM | POA: Diagnosis not present

## 2018-12-27 DIAGNOSIS — K219 Gastro-esophageal reflux disease without esophagitis: Secondary | ICD-10-CM | POA: Diagnosis not present

## 2018-12-27 DIAGNOSIS — K5790 Diverticulosis of intestine, part unspecified, without perforation or abscess without bleeding: Secondary | ICD-10-CM | POA: Diagnosis not present

## 2018-12-27 DIAGNOSIS — M47819 Spondylosis without myelopathy or radiculopathy, site unspecified: Secondary | ICD-10-CM | POA: Diagnosis not present

## 2019-01-01 DIAGNOSIS — I69354 Hemiplegia and hemiparesis following cerebral infarction affecting left non-dominant side: Secondary | ICD-10-CM | POA: Diagnosis not present

## 2019-01-01 DIAGNOSIS — I10 Essential (primary) hypertension: Secondary | ICD-10-CM | POA: Diagnosis not present

## 2019-01-01 DIAGNOSIS — K219 Gastro-esophageal reflux disease without esophagitis: Secondary | ICD-10-CM | POA: Diagnosis not present

## 2019-01-01 DIAGNOSIS — K5790 Diverticulosis of intestine, part unspecified, without perforation or abscess without bleeding: Secondary | ICD-10-CM | POA: Diagnosis not present

## 2019-01-01 DIAGNOSIS — M47819 Spondylosis without myelopathy or radiculopathy, site unspecified: Secondary | ICD-10-CM | POA: Diagnosis not present

## 2019-01-01 DIAGNOSIS — Z471 Aftercare following joint replacement surgery: Secondary | ICD-10-CM | POA: Diagnosis not present

## 2019-01-02 ENCOUNTER — Encounter: Payer: Self-pay | Admitting: Physical Therapy

## 2019-01-02 ENCOUNTER — Ambulatory Visit: Payer: Medicare Other | Attending: Orthopedic Surgery | Admitting: Physical Therapy

## 2019-01-02 ENCOUNTER — Other Ambulatory Visit: Payer: Self-pay

## 2019-01-02 DIAGNOSIS — M6281 Muscle weakness (generalized): Secondary | ICD-10-CM | POA: Insufficient documentation

## 2019-01-02 DIAGNOSIS — M25512 Pain in left shoulder: Secondary | ICD-10-CM

## 2019-01-02 DIAGNOSIS — M25612 Stiffness of left shoulder, not elsewhere classified: Secondary | ICD-10-CM | POA: Insufficient documentation

## 2019-01-02 NOTE — Therapy (Addendum)
[facility] [address], [place], [address] Phone: [phone]   Fax:  [phone]  Physical Therapy Evaluation  Patient Details  Name: [name] MRN: [id number] Date of Birth: [birth year] Referring Provider (PT): Lenon Oms MD   Encounter Date: 01/02/2019  PT End of Session - 01/02/19 1034    Visit Number  1    Number of Visits  17    Date for PT Re-Evaluation  02/27/19    PT Start Time  2836    PT Stop Time  1110    PT Time Calculation (min)  55 min    Activity Tolerance  Patient tolerated treatment well    Behavior During Therapy  [facility] for tasks assessed/performed       Past Medical History:  Diagnosis Date  . Arthritis    back, fingers with joint pain and swelling.  chronic back pain  . Chronic back pain    arthritis   . Diverticulosis   . Elevated cholesterol    takes Niacin daily and Simvastatin  . GERD (gastroesophageal reflux disease) 06/2004   non-specific gastritis on EGD 06/2004  . Headache(784.0)    occasionally  . History of colon polyps 2004, 2009   2004:adenomatous. 2009 hyperplastic.   Marland Kitchen History of hiatal hernia   . Hypertension    takes Tenoretic and Lisinopril daily  . Mucoid cyst of joint 08/2013   right index finger  . Osteopenia 01/2014   T score -1.1 FRAX 14%/0.5%. Stable from prior DEXA  . PONV (postoperative nausea and vomiting)   . Rotator cuff arthropathy    Left  . Seasonal allergies   . Stroke ([place]) 1998   x 2 - mild left-sided weakness  . Urge incontinence   . Uterine prolapse     Past Surgical History:  Procedure Laterality Date  . BACK SURGERY     x 2  . BELPHAROPTOSIS REPAIR Bilateral 12/14/2017  . CATARACT EXTRACTION    . CATARACT EXTRACTION Bilateral 10/2017  . COLONOSCOPY  2004, 2009, 2014  . FINGER SURGERY    . GUM SURGERY    . GYNECOLOGIC CRYOSURGERY    . HYSTEROSCOPY W/D&C  12/07/2010   with resection of endometrial polyp  . JOINT  REPLACEMENT    . KNEE ARTHROSCOPY Left 2008  . lip biopsy     done at MD office Fri 11/22/13  . MASS EXCISION Right 08/15/2013   Procedure: RIGHT INDEX EXCISION MASS ;  Surgeon: Tennis Must, MD;  Location: [facility];  Service: Orthopedics;  Laterality: Right;  . NASAL SEPTUM SURGERY    . OOPHORECTOMY Right 2000  . REVERSE SHOULDER ARTHROPLASTY Left 12/11/2018  . REVERSE SHOULDER ARTHROPLASTY Left 12/11/2018   Procedure: LEFT REVERSE SHOULDER ARTHROPLASTY;  Surgeon: Meredith Pel, MD;  Location: [facility];  Service: Orthopedics;  Laterality: Left;  . SHOULDER ARTHROSCOPY WITH OPEN ROTATOR CUFF REPAIR AND DISTAL CLAVICLE ACROMINECTOMY Right 11/26/2013   Procedure: RIGHT SHOULDER ARTHROSCOPY WITH MINI OPEN ROTATOR CUFF REPAIR AND DISTAL CLAVICLE RESECTION, SUBACROMIAL DECOMPRESSION, POSSIBLE [facility] PATCH.;  Surgeon: [name], MD;  Location: [facility];  Service: Orthopedics;  Laterality: Right;  . TOTAL KNEE ARTHROPLASTY Right 03/21/2017  . TOTAL KNEE ARTHROPLASTY Right 03/21/2017   Procedure: RIGHT TOTAL KNEE ARTHROPLASTY;  Surgeon: [name], MD;  Location: [place];  Service: Orthopedics;  Laterality: Right;  . TUBAL LIGATION      There were no vitals filed for  this visit.  Subjective Assessment - 01/02/19 1018    Pertinent History  Pt is a 68 year old female s/p L reverse total shoulder 12/11/18. Reports she was immobilized in her sling for 2 weeks. After immobilization has been using passive motion machine for flexion. Has been doing AAROM flexion/abd/ER, bicep, shrugs, and forward punches with 1# with HHPT over the past week. MD Marlou Sa advised patient for out of sling, AAROM, PROM with no lifting of more than 5# until next followup with him (mid Oct). Pt reports only 2/10 at rest, 6/10 with motion. Pain is at the medial upper arm and in the armpit. Pain is worse during the evening after moving it all day. Patient lives with her husband and her 4 dogs, which she walks  frequently and cares for. She is R handed.Pt denies N/V, B&B changes, unexplained weight fluctuation, saddle paresthesia, fever, night sweats, or unrelenting night pain at this time.Pt denies N/V, B&B changes, unexplained weight fluctuation, saddle paresthesia, fever, night sweats, or unrelenting night pain at this time.    Limitations  House hold activities;Lifting    How long can you sit comfortably?  unlimited    How long can you stand comfortably?  unlimited    How long can you walk comfortably?  unlimited    Diagnostic tests  Xrays    Patient Stated Goals  Being able to complete daily tasks    Currently in Pain?  Yes    Pain Score  2     Pain Location  Shoulder    Pain Orientation  Left;Medial    Pain Descriptors / Indicators  Sharp    Pain Type  Surgical pain    Pain Radiating Towards  none    Pain Onset  1 to 4 weeks ago   Surgery 12/11/18   Pain Frequency  Intermittent    Aggravating Factors   lifting, sleeping, pushing, pulling    Pain Relieving Factors  Resting, ice    Effect of Pain on Daily Activities  unable to complete ADLs bathing, dressing, cooking, cleaning          OBJECTIVE  MUSCULOSKELETAL: Tremor: Normal Bulk: Normal Tone: Normal  Cervical Screen AROM: WFL in all directions except lateral bending where she is approx 50% limited bilat which she attributes to sitting in her passive motion machine  Elbow Screen Elbow AROM WNL bilat a little pain in the shoulder with L elbow flex  Palpation TTP at medial upper arm, bicep, pec minor/major  Posture: forward head, rounded shoulders, shoulder elevation bilat  Strength R/L 4/- Shoulder flexion (anterior deltoid/pec major/coracobrachialis, axillary n. (C5-6) and musculocutaneous n. (C5-7)) 4+/- Shoulder abduction (deltoid/supraspinatus, axillary/suprascapular n, C5) 4-/- Shoulder external rotation (infraspinatus/teres minor) 4/- Shoulder internal rotation (subcapularis/lats/pec major) 5/5 Shoulder extension  (posterior deltoid, lats, teres major, axillary/thoracodorsal n.) 5/4- ( no break test) Elbow flexion (biceps brachii, brachialis, brachioradialis, musculoskeletal n, C5-6) 5/3 (no break test) Elbow extension (triceps, radial n, C7) 5/5 Wrist Extension 5/5 Wrist Flexion 5/5 Finger adduction (interossei, ulnar n, T1) 4+/4- Latissimus 40lbs/22lbs Grip Strength   AROM R/L 180/74 Shoulder flexion 180/58 Shoulder abduction C8/unable Shoulder external rotation L1/L greater trochanter Shoulder internal rotation 60/not tested Shoulder extension AROM tested in pain free motion only   PROM R/L 180/130 Shoulder flexion 180/93 Shoulder abduction 90/38 Shoulder external rotation 70/30 Shoulder internal rotation 60/Not tested Shoulder extension *Indicates pain, overpressure performed unless otherwise indicated  NEUROLOGICAL:  Mental Status Patient is oriented to person, place and time.  Recent memory  is intact.  Remote memory is intact.  Attention span and concentration are intact.  Expressive speech is intact.  Patient's fund of knowledge is within normal limits for educational level. Sensation Grossly intact to light touch bilateral UE as determined by testing dermatomes C2-T2 Proprioception and hot/cold testing deferred on this date  Ther-Ex Pulleys flex and abd x10 each direction with 3sec holds in minimal pain ROM (education this is interchangeable with cane AAROM) Hand gripper with RTB x10 with education on how to increase intensity as needed with green and blue bands UT stretch x30sec hold with education on postural carry over of proper scapular positioning                          PT Education - 01/02/19 1033    Education Details  Patient was educated on diagnosis, anatomy and pathology involved, prognosis, role of PT, and was given an HEP, demonstrating exercise with proper form following verbal and tactile cues, and was given a paper hand out to  continue exercise at home. Pt was educated on and agreed to plan of care.    Person(s) Educated  Patient    Methods  Explanation;Demonstration;Tactile cues;Verbal cues;Handout    Comprehension  Verbalized understanding;Returned demonstration;Verbal cues required;Tactile cues required       PT Short Term Goals - 01/02/19 1121      PT SHORT TERM GOAL #1   Title  Pt will be independent with HEP in order to improve strength and balance in order to improve function at home    Baseline  01/02/19    Time  4    Period  Weeks    Status  New        PT Long Term Goals - 01/02/19 1115      PT LONG TERM GOAL #1   Title  Pt will decrease quick DASH score by at least 8% in order to demonstrate clinically significant reduction in disability.    Baseline  01/02/19 79.5%    Time  8    Period  Weeks    Status  New      PT LONG TERM GOAL #2   Title  Patient will demonstrate full AROM in L shoulder in order to complete household ADLs    Baseline  01/02/19 see eval    Time  8    Period  Weeks    Status  New      PT LONG TERM GOAL #3   Title  Pt will decrease worst pain as reported on NPRS by at least 3 points in order to demonstrate clinically significant reduction in pain.    Baseline  01/02/19 6/10 pain with movement    Time  8    Period  Weeks    Status  New      PT LONG TERM GOAL #4   Title  Pt will demonstrate gross bilat shoulder and periscapular strength of 4/10 in order to safely lift for heavy household chores    Baseline  01/02/19 see eval    Time  8    Period  Weeks    Status  New            Plan - 01/02/19 1123    Clinical Impression Statement  Pt is a 68 year old female s/p L reverse total shoulder 12/11/18 with current restrictions of not to lift >5#, and no shoulder ext until follow up 01/21/19. Patient with impairments in  shoulder motion, shouder, grip, and periscapular strength, posture deficits, and pain. Activity limitations in overhead lifting, carrying, pushing, and  pulling; inhibiting participation in ADLs and caring for her 4 dogs. Patient will benefit from skilled PT to address impairments to restore function    Personal Factors and Comorbidities  Age;Sex;Behavior Pattern;Comorbidity 1;Fitness    Comorbidities  HTN    Examination-Activity Limitations  Dressing;Carry;Reach Overhead;Lift    Examination-Participation Restrictions  Driving;Community Activity;Cleaning    Stability/Clinical Decision Making  Evolving/Moderate complexity    Clinical Decision Making  Moderate    Rehab Potential  Good    PT Frequency  2x / week    PT Duration  8 weeks    PT Treatment/Interventions  ADLs/Self Care Home Management;Electrical Stimulation;Therapeutic activities;Passive range of motion;Manual techniques;Patient/family education;Therapeutic exercise;Iontophoresis 55m/ml Dexamethasone;Moist Heat;Traction;Cryotherapy;Ultrasound;Functional mobility training;Neuromuscular re-education;Balance training;Dry needling;Joint Manipulations    PT Next Visit Plan  HEP review, shoulder motion    PT Home Exercise Plan  AAROM cane, pulleys, grip strength, UT stretch    Consulted and Agree with Plan of Care  Patient       Patient will benefit from skilled therapeutic intervention in order to improve the following deficits and impairments:  Decreased mobility, Decreased endurance, Pain, Postural dysfunction, Impaired UE functional use, Impaired flexibility, Increased fascial restricitons, Decreased strength, Decreased coordination, Decreased range of motion, Improper body mechanics  Visit Diagnosis: Acute pain of left shoulder  Stiffness of left shoulder, not elsewhere classified     Problem List Patient Active Problem List   Diagnosis Date Noted  . 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 Chronic left shoulder pain 08/23/2018  . Dysuria 08/06/2018  . History of total right knee replacement 03/21/2017  . Chronic diarrhea 12/07/2014  . Segmental [place] 09/12/2014  . Routine general medical examination at a health care facility 06/05/2014  . Overweight 06/13/2011  . ABNORMAL CHEST XRAY 09/30/2007  . Hyperlipidemia 02/17/2007  . Essential hypertension 01/08/2007  . Osteoarthritis 01/08/2007   CShelton SilvasPT, DPT CShelton Silvas9/23/2020, 1:13 PM  [place] [facility] [address] [place] [address]Phone: [phone]  Fax:  [phone] Name: Stacy BORUNDAMRN: [id number] of Birth: 3August 30, 1952

## 2019-01-04 ENCOUNTER — Encounter: Payer: Self-pay | Admitting: Physical Therapy

## 2019-01-04 ENCOUNTER — Other Ambulatory Visit: Payer: Self-pay

## 2019-01-04 ENCOUNTER — Ambulatory Visit: Payer: Medicare Other

## 2019-01-04 DIAGNOSIS — M25512 Pain in left shoulder: Secondary | ICD-10-CM | POA: Diagnosis not present

## 2019-01-04 DIAGNOSIS — M25612 Stiffness of left shoulder, not elsewhere classified: Secondary | ICD-10-CM

## 2019-01-04 DIAGNOSIS — M6281 Muscle weakness (generalized): Secondary | ICD-10-CM

## 2019-01-04 NOTE — Therapy (Signed)
[facility] [address], [place], [address] Phone: [phone]   Fax:  [phone]  Physical Therapy Treatment  Patient Details  Name: [name] MRN: [id number] Date of Birth: [birth year] Referring Provider (PT): Lenon Oms MD   Encounter Date: 01/04/2019  PT End of Session - 01/04/19 1020    Visit Number  2    Number of Visits  17    Date for PT Re-Evaluation  02/27/19    PT Start Time  1001    PT Stop Time  1045    PT Time Calculation (min)  44 min    Activity Tolerance  Patient tolerated treatment well    Behavior During Therapy  [facility] for tasks assessed/performed       Past Medical History:  Diagnosis Date  . Arthritis    back, fingers with joint pain and swelling.  chronic back pain  . Chronic back pain    arthritis   . Diverticulosis   . Elevated cholesterol    takes Niacin daily and Simvastatin  . GERD (gastroesophageal reflux disease) 06/2004   non-specific gastritis on EGD 06/2004  . Headache(784.0)    occasionally  . History of colon polyps 2004, 2009   2004:adenomatous. 2009 hyperplastic.   Marland Kitchen History of hiatal hernia   . Hypertension    takes Tenoretic and Lisinopril daily  . Mucoid cyst of joint 08/2013   right index finger  . Osteopenia 01/2014   T score -1.1 FRAX 14%/0.5%. Stable from prior DEXA  . PONV (postoperative nausea and vomiting)   . Rotator cuff arthropathy    Left  . Seasonal allergies   . Stroke ([place]) 1998   x 2 - mild left-sided weakness  . Urge incontinence   . Uterine prolapse     Past Surgical History:  Procedure Laterality Date  . BACK SURGERY     x 2  . BELPHAROPTOSIS REPAIR Bilateral 12/14/2017  . CATARACT EXTRACTION    . CATARACT EXTRACTION Bilateral 10/2017  . COLONOSCOPY  2004, 2009, 2014  . FINGER SURGERY    . GUM SURGERY    . GYNECOLOGIC CRYOSURGERY    . HYSTEROSCOPY W/D&C  12/07/2010   with resection of endometrial polyp  . JOINT  REPLACEMENT    . KNEE ARTHROSCOPY Left 2008  . lip biopsy     done at MD office Fri 11/22/13  . MASS EXCISION Right 08/15/2013   Procedure: RIGHT INDEX EXCISION MASS ;  Surgeon: Tennis Must, MD;  Location: [facility];  Service: Orthopedics;  Laterality: Right;  . NASAL SEPTUM SURGERY    . OOPHORECTOMY Right 2000  . REVERSE SHOULDER ARTHROPLASTY Left 12/11/2018  . REVERSE SHOULDER ARTHROPLASTY Left 12/11/2018   Procedure: LEFT REVERSE SHOULDER ARTHROPLASTY;  Surgeon: Meredith Pel, MD;  Location: [facility];  Service: Orthopedics;  Laterality: Left;  . SHOULDER ARTHROSCOPY WITH OPEN ROTATOR CUFF REPAIR AND DISTAL CLAVICLE ACROMINECTOMY Right 11/26/2013   Procedure: RIGHT SHOULDER ARTHROSCOPY WITH MINI OPEN ROTATOR CUFF REPAIR AND DISTAL CLAVICLE RESECTION, SUBACROMIAL DECOMPRESSION, POSSIBLE [facility] PATCH.;  Surgeon: [name], MD;  Location: [facility];  Service: Orthopedics;  Laterality: Right;  . TOTAL KNEE ARTHROPLASTY Right 03/21/2017  . TOTAL KNEE ARTHROPLASTY Right 03/21/2017   Procedure: RIGHT TOTAL KNEE ARTHROPLASTY;  Surgeon: [name], MD;  Location: [facility];  Service: Orthopedics;  Laterality: Right;  . TUBAL LIGATION      There were no vitals filed for  this visit.  Subjective Assessment - 01/04/19 1017    Subjective  Patient reported after her PT evaluation, no questions/concerns or increased pain    Pertinent History  Pt is a 68 year old female s/p L reverse total shoulder 12/11/18. Reports she was immobilized in her sling for 2 weeks. After immobilization has been using passive motion machine for flexion. Has been doing AAROM flexion/abd/ER, bicep, shrugs, and forward punches with 1# with HHPT over the past week. MD Marlou Sa advised patient for out of sling, AAROM, PROM with no lifting of more than 5# until next followup with him (mid Oct). Pt reports only 2/10 at rest, 6/10 with motion. Pain is at the medial upper arm and in the armpit. Pain is worse during the  evening after moving it all day. Patient lives with her husband and her 4 dogs, which she walks frequently and cares for. She is R handed.Pt denies N/V, B&B changes, unexplained weight fluctuation, saddle paresthesia, fever, night sweats, or unrelenting night pain at this time.Pt denies N/V, B&B changes, unexplained weight fluctuation, saddle paresthesia, fever, night sweats, or unrelenting night pain at this time.    Limitations  House hold activities;Lifting    How long can you sit comfortably?  unlimited    How long can you stand comfortably?  unlimited    How long can you walk comfortably?  unlimited    Diagnostic tests  Xrays    Patient Stated Goals  Being able to complete daily tasks    Currently in Pain?  Yes    Pain Score  1     Pain Location  Shoulder    Pain Orientation  Left;Medial    Pain Descriptors / Indicators  Sharp;Aching    Pain Type  Surgical pain    Pain Onset  1 to 4 weeks ago    Pain Frequency  Intermittent       TREATMENT:  Ther-Ex PROM on L shoulder all directions, ER at neutral and ~45degs. Focus on pain free ROM  Supine AAROM flexion, scaption, abduction, ER (~20deg abduction) with dowel 10x5sec holds ea direction, towel roll under arm to keep arm in neutral position  Pulleys flex, scaption and abd x10 each direction with 3sec holds in minimal pain ROM   Scapular retractions in seated position 2x10  Shoulder rolls and shoulder shrugs x10 in sitting position   Pt response/clinical impression: The patient intermittently expressed pain during exercises this session, reminders to avoid very painful ROM. Pt had gradual improvement in ROM with PROM this session. The patient would benefit from further skilled PT intervention to return to PLOF per protocol.    PT Education - 01/04/19 1018    Education Details  exercise forn/technique    Person(s) Educated  Patient    Methods  Explanation;Demonstration;Tactile cues    Comprehension  Verbalized  understanding;Returned demonstration;Verbal cues required;Tactile cues required       PT Short Term Goals - 01/02/19 1121      PT SHORT TERM GOAL #1   Title  Pt will be independent with HEP in order to improve strength and balance in order to improve function at home    Baseline  01/02/19    Time  4    Period  Weeks    Status  New        PT Long Term Goals - 01/02/19 1115      PT LONG TERM GOAL #1   Title  Pt will decrease quick DASH score by at  least 8% in order to demonstrate clinically significant reduction in disability.    Baseline  01/02/19 79.5%    Time  8    Period  Weeks    Status  New      PT LONG TERM GOAL #2   Title  Patient will demonstrate full AROM in L shoulder in order to complete household ADLs    Baseline  01/02/19 see eval    Time  8    Period  Weeks    Status  New      PT LONG TERM GOAL #3   Title  Pt will decrease worst pain as reported on NPRS by at least 3 points in order to demonstrate clinically significant reduction in pain.    Baseline  01/02/19 6/10 pain with movement    Time  8    Period  Weeks    Status  New      PT LONG TERM GOAL #4   Title  Pt will demonstrate gross bilat shoulder and periscapular strength of 4/10 in order to safely lift for heavy household chores    Baseline  01/02/19 see eval    Time  8    Period  Weeks    Status  New            Plan - 01/04/19 1019    Clinical Impression Statement  The patient intermittently expressed pain during exercises this session, reminders to avoid very painful ROM. Pt had gradual improvement in ROM with PROM this session. The patient would benefit from further skilled PT intervention to return to PLOF per protocol.    Personal Factors and Comorbidities  Age;Sex;Behavior Pattern;Comorbidity 1;Fitness    Comorbidities  HTN    Examination-Activity Limitations  Dressing;Carry;Reach Overhead;Lift    Examination-Participation Restrictions  Driving;Community Activity;Cleaning    Rehab  Potential  Good    PT Frequency  2x / week    PT Duration  8 weeks    PT Treatment/Interventions  ADLs/Self Care Home Management;Electrical Stimulation;Therapeutic activities;Passive range of motion;Manual techniques;Patient/family education;Therapeutic exercise;Iontophoresis 68m/ml Dexamethasone;Moist Heat;Traction;Cryotherapy;Ultrasound;Functional mobility training;Neuromuscular re-education;Balance training;Dry needling;Joint Manipulations    PT Next Visit Plan  HEP review, shoulder motion    PT Home Exercise Plan  AAROM cane, pulleys, grip strength, UT stretch    Consulted and Agree with Plan of Care  Patient       Patient will benefit from skilled therapeutic intervention in order to improve the following deficits and impairments:  Decreased mobility, Decreased endurance, Pain, Postural dysfunction, Impaired UE functional use, Impaired flexibility, Increased fascial restricitons, Decreased strength, Decreased coordination, Decreased range of motion, Improper body mechanics  Visit Diagnosis: Acute pain of left shoulder  Stiffness of left shoulder, not elsewhere classified  Muscle weakness (generalized)     Problem List Patient Active Problem List   Diagnosis Date Noted  . 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 Chronic left shoulder pain 08/23/2018  . Dysuria 08/06/2018  . History of total right knee replacement 03/21/2017  . Chronic diarrhea 12/07/2014  . Segmental colitis with rectal bleeding [place] 09/12/2014  . Routine general medical examination at a health care facility 06/05/2014  . Overweight 06/13/2011  . ABNORMAL CHEST XRAY 09/30/2007  . Hyperlipidemia 02/17/2007  . Essential hypertension 01/08/2007  . Osteoarthritis 01/08/2007    DLieutenant DiegoPT, DPT 11:00 AM,01/04/19 3[facility] [address] [place] [address]Phone: [phone]  Fax:  [phone] Name:  [name] MRN: [id number] Date of Birth: October 18, 1950

## 2019-01-07 ENCOUNTER — Ambulatory Visit: Payer: Medicare Other

## 2019-01-07 ENCOUNTER — Other Ambulatory Visit: Payer: Self-pay

## 2019-01-07 DIAGNOSIS — M25512 Pain in left shoulder: Secondary | ICD-10-CM

## 2019-01-07 DIAGNOSIS — M25612 Stiffness of left shoulder, not elsewhere classified: Secondary | ICD-10-CM | POA: Diagnosis not present

## 2019-01-07 DIAGNOSIS — M6281 Muscle weakness (generalized): Secondary | ICD-10-CM | POA: Diagnosis not present

## 2019-01-07 NOTE — Therapy (Signed)
[facility] [address] [place], [address] Phone: [phone]   Fax:  [phone]  Physical Therapy Treatment  Patient Details  Name: [name] MRN: [id number] Date of Birth: [birth year] Referring Provider (PT): Lenon Oms MD   Encounter Date: 01/07/2019  PT End of Session - 01/07/19 0952    Visit Number  3    Number of Visits  17    Date for PT Re-Evaluation  02/27/19    PT Start Time  0949    PT Stop Time  4193    PT Time Calculation (min)  40 min    Activity Tolerance  Patient tolerated treatment well;No increased pain    Behavior During Therapy  WFL for tasks assessed/performed       Past Medical History:  Diagnosis Date  . Arthritis    back, fingers with joint pain and swelling.  chronic back pain  . Chronic back pain    arthritis   . Diverticulosis   . Elevated cholesterol    takes Niacin daily and Simvastatin  . GERD (gastroesophageal reflux disease) 06/2004   non-specific gastritis on EGD 06/2004  . Headache(784.0)    occasionally  . History of colon polyps 2004, 2009   2004:adenomatous. 2009 hyperplastic.   Marland Kitchen History of hiatal hernia   . Hypertension    takes Tenoretic and Lisinopril daily  . Mucoid cyst of joint 08/2013   right index finger  . Osteopenia 01/2014   T score -1.1 FRAX 14%/0.5%. Stable from prior DEXA  . PONV (postoperative nausea and vomiting)   . Rotator cuff arthropathy    Left  . Seasonal allergies   . Stroke ([place]) 1998   x 2 - mild left-sided weakness  . Urge incontinence   . Uterine prolapse     Past Surgical History:  Procedure Laterality Date  . BACK SURGERY     x 2  . BELPHAROPTOSIS REPAIR Bilateral 12/14/2017  . CATARACT EXTRACTION    . CATARACT EXTRACTION Bilateral 10/2017  . COLONOSCOPY  2004, 2009, 2014  . FINGER SURGERY    . GUM SURGERY    . GYNECOLOGIC CRYOSURGERY    . HYSTEROSCOPY W/D&C  12/07/2010   with resection of endometrial  polyp  . JOINT REPLACEMENT    . KNEE ARTHROSCOPY Left 2008  . lip biopsy     done at MD office Fri 11/22/13  . MASS EXCISION Right 08/15/2013   Procedure: RIGHT INDEX EXCISION MASS ;  Surgeon: Tennis Must, MD;  Location: [facility];  Service: Orthopedics;  Laterality: Right;  . NASAL SEPTUM SURGERY    . OOPHORECTOMY Right 2000  . REVERSE SHOULDER ARTHROPLASTY Left 12/11/2018  . REVERSE SHOULDER ARTHROPLASTY Left 12/11/2018   Procedure: LEFT REVERSE SHOULDER ARTHROPLASTY;  Surgeon: Meredith Pel, MD;  Location: [facility];  Service: Orthopedics;  Laterality: Left;  . SHOULDER ARTHROSCOPY WITH OPEN ROTATOR CUFF REPAIR AND DISTAL CLAVICLE ACROMINECTOMY Right 11/26/2013   Procedure: RIGHT SHOULDER ARTHROSCOPY WITH MINI OPEN ROTATOR CUFF REPAIR AND DISTAL CLAVICLE RESECTION, SUBACROMIAL DECOMPRESSION, POSSIBLE [facility] PATCH.;  Surgeon: [name], MD;  Location: [facility];  Service: Orthopedics;  Laterality: Right;  . TOTAL KNEE ARTHROPLASTY Right 03/21/2017  . TOTAL KNEE ARTHROPLASTY Right 03/21/2017   Procedure: RIGHT TOTAL KNEE ARTHROPLASTY;  Surgeon: [name], MD;  Location: [facility];  Service: Orthopedics;  Laterality: Right;  . TUBAL LIGATION      There were no vitals  filed for this visit.  Subjective Assessment - 01/07/19 0951    Subjective  Pt reports stiffness this morning as she has just woken up and not done much with he rhsoulder. No updates to medications.    Pertinent History  Pt is a 68 year old female s/p L reverse total shoulder 12/11/18. Reports she was immobilized in her sling for 2 weeks. After immobilization has been using passive motion machine for flexion. Has been doing AAROM flexion/abd/ER, bicep, shrugs, and forward punches with 1# with HHPT over the past week. MD Marlou Sa advised patient for out of sling, AAROM, PROM with no lifting of more than 5# until next followup with him (mid Oct). Pt reports only 2/10 at rest, 6/10 with motion. Pain is at the  medial upper arm and in the armpit. Pain is worse during the evening after moving it all day. Patient lives with her husband and her 4 dogs, which she walks frequently and cares for. She is R handed.Pt denies N/V, B&B changes, unexplained weight fluctuation, saddle paresthesia, fever, night sweats, or unrelenting night pain at this time.Pt denies N/V, B&B changes, unexplained weight fluctuation, saddle paresthesia, fever, night sweats, or unrelenting night pain at this time.    Currently in Pain?  Yes    Pain Score  6     Pain Orientation  --   Left shoulder       INTERVENTION THIS DATE -Pulleys flex, scaption, abdct x15 each direction with 3sec holds in minimal pain ROMUT stretch x30sec hold with education on postural carry over of proper scapular positioning -Left elbow flexion A/ROM x15 (standing) -Standing bilat shoulder elevation, 4lb free weights bilat; multimodal cues for form, and neutral head position  -MFR to deltoid x 5 minutes -MFR to pec Major x3 minutes -Lt shoulder pec major stretch (90 degree abduction, arm supported) x 3 minutes  -Left shoulder ER stretch in scapular plane 5x10sec, 5x5sec, 5x2sec (tolerated well to 45 degrees)  -Left shoulder (short lever) scaption to 90 degrees, supine, AA/ROM (2x12) -Left shoulder (short lever) flexion to 90 degrees, supine AA/ROM (2x12) -Supine Left shoulder (short lever) extension isometric in scapular plane 2x12x3secH (elbow into towel roll) -standing scapular retraction 15x3secH     PT Short Term Goals - 01/02/19 1121      PT SHORT TERM GOAL #1   Title  Pt will be independent with HEP in order to improve strength and balance in order to improve function at home    Baseline  01/02/19    Time  4    Period  Weeks    Status  New        PT Long Term Goals - 01/02/19 1115      PT LONG TERM GOAL #1   Title  Pt will decrease quick DASH score by at least 8% in order to demonstrate clinically significant reduction in disability.     Baseline  01/02/19 79.5%    Time  8    Period  Weeks    Status  New      PT LONG TERM GOAL #2   Title  Patient will demonstrate full AROM in L shoulder in order to complete household ADLs    Baseline  01/02/19 see eval    Time  8    Period  Weeks    Status  New      PT LONG TERM GOAL #3   Title  Pt will decrease worst pain as reported on NPRS by at least 3  points in order to demonstrate clinically significant reduction in pain.    Baseline  01/02/19 6/10 pain with movement    Time  8    Period  Weeks    Status  New      PT LONG TERM GOAL #4   Title  Pt will demonstrate gross bilat shoulder and periscapular strength of 4/10 in order to safely lift for heavy household chores    Baseline  01/02/19 see eval    Time  8    Period  Weeks    Status  New            Plan - 01/07/19 0953    Clinical Impression Statement  In general, patient demonstrating good tolerance to therapy session this date, reasonable accommodations are alllowed in-session to allow adequate rest between activities as needed. All interventional executed without any exacerbation of pain or other symptoms, good pain relief response after MFR/ART to pec major. Pt demonstrates focused motivation to fully participate in therapy to the best of ability. 3 steristrips remain in place.  Pt given intermittent multimodal cues to teach best possible form with exercises. Pt continues to make steady progress toward most goals. No home exercise updates made at this time.    Rehab Potential  Good    PT Frequency  2x / week    PT Treatment/Interventions  ADLs/Self Care Home Management;Electrical Stimulation;Therapeutic activities;Passive range of motion;Manual techniques;Patient/family education;Therapeutic exercise;Iontophoresis 71m/ml Dexamethasone;Moist Heat;Traction;Cryotherapy;Ultrasound;Functional mobility training;Neuromuscular re-education;Balance training;Dry needling;Joint Manipulations    PT Next Visit Plan  HEP review,  shoulder motion    PT Home Exercise Plan  AAROM cane, pulleys, grip strength, UT stretch    Consulted and Agree with Plan of Care  Patient       Patient will benefit from skilled therapeutic intervention in order to improve the following deficits and impairments:  Decreased mobility, Decreased endurance, Pain, Postural dysfunction, Impaired UE functional use, Impaired flexibility, Increased fascial restricitons, Decreased strength, Decreased coordination, Decreased range of motion, Improper body mechanics  Visit Diagnosis: Acute pain of left shoulder  Stiffness of left shoulder, not elsewhere classified  Muscle weakness (generalized)     Problem List Patient Active Problem List   Diagnosis Date Noted  . 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 Chronic left shoulder pain 08/23/2018  . Dysuria 08/06/2018  . History of total right knee replacement 03/21/2017  . Chronic diarrhea 12/07/2014  . Segmental colitis with rectal bleeding ([place] 09/12/2014  . Routine general medical examination at a health care facility 06/05/2014  . Overweight 06/13/2011  . ABNORMAL CHEST XRAY 09/30/2007  . Hyperlipidemia 02/17/2007  . Essential hypertension 01/08/2007  . Osteoarthritis 01/08/2007   10:23 AM, 01/07/19 AEtta Grandchild PT, DPT Physical Therapist - [phone](Office)    Buccola,Allan C 01/07/2019, 9:55 AM  [facility] [address] [place] [address]Phone: [phone]  Fax:  [phone] Name: NTESSY PAWELSKIMRN: [id number] of Birth: [birth year]

## 2019-01-09 ENCOUNTER — Ambulatory Visit: Payer: Medicare Other

## 2019-01-09 ENCOUNTER — Other Ambulatory Visit: Payer: Self-pay

## 2019-01-09 DIAGNOSIS — M25612 Stiffness of left shoulder, not elsewhere classified: Secondary | ICD-10-CM

## 2019-01-09 DIAGNOSIS — M6281 Muscle weakness (generalized): Secondary | ICD-10-CM | POA: Diagnosis not present

## 2019-01-09 DIAGNOSIS — M25512 Pain in left shoulder: Secondary | ICD-10-CM

## 2019-01-09 NOTE — Therapy (Signed)
[facility] [address] [place], [address] Phone: [phone]   Fax:  [phone]  Physical Therapy Treatment  Patient Details  Name: [name] MRN: [id number] Date of Birth: 11/30/1950 Referring Provider (PT): Lenon Oms MD   Encounter Date: 01/09/2019  PT End of Session - 01/09/19 1032    Visit Number  4    Number of Visits  17    Date for PT Re-Evaluation  02/27/19    PT Start Time  0017    PT Stop Time  1109    PT Time Calculation (min)  40 min    Activity Tolerance  Patient tolerated treatment well;No increased pain    Behavior During Therapy  WFL for tasks assessed/performed       Past Medical History:  Diagnosis Date  . Arthritis    back, fingers with joint pain and swelling.  chronic back pain  . Chronic back pain    arthritis   . Diverticulosis   . Elevated cholesterol    takes Niacin daily and Simvastatin  . GERD (gastroesophageal reflux disease) 06/2004   non-specific gastritis on EGD 06/2004  . Headache(784.0)    occasionally  . History of colon polyps 2004, 2009   2004:adenomatous. 2009 hyperplastic.   Marland Kitchen History of hiatal hernia   . Hypertension    takes Tenoretic and Lisinopril daily  . Mucoid cyst of joint 08/2013   right index finger  . Osteopenia 01/2014   T score -1.1 FRAX 14%/0.5%. Stable from prior DEXA  . PONV (postoperative nausea and vomiting)   . Rotator cuff arthropathy    Left  . Seasonal allergies   . Stroke ([place]) 1998   x 2 - mild left-sided weakness  . Urge incontinence   . Uterine prolapse     Past Surgical History:  Procedure Laterality Date  . BACK SURGERY     x 2  . BELPHAROPTOSIS REPAIR Bilateral 12/14/2017  . CATARACT EXTRACTION    . CATARACT EXTRACTION Bilateral 10/2017  . COLONOSCOPY  2004, 2009, 2014  . FINGER SURGERY    . GUM SURGERY    . GYNECOLOGIC CRYOSURGERY    . HYSTEROSCOPY W/D&C  12/07/2010   with resection of endometrial  polyp  . JOINT REPLACEMENT    . KNEE ARTHROSCOPY Left 2008  . lip biopsy     done at MD office Fri 11/22/13  . MASS EXCISION Right 08/15/2013   Procedure: RIGHT INDEX EXCISION MASS ;  Surgeon: Tennis Must, MD;  Location: [facility];  Service: Orthopedics;  Laterality: Right;  . NASAL SEPTUM SURGERY    . OOPHORECTOMY Right 2000  . REVERSE SHOULDER ARTHROPLASTY Left 12/11/2018  . REVERSE SHOULDER ARTHROPLASTY Left 12/11/2018   Procedure: LEFT REVERSE SHOULDER ARTHROPLASTY;  Surgeon: Meredith Pel, MD;  Location: [facility];  Service: Orthopedics;  Laterality: Left;  . SHOULDER ARTHROSCOPY WITH OPEN ROTATOR CUFF REPAIR AND DISTAL CLAVICLE ACROMINECTOMY Right 11/26/2013   Procedure: RIGHT SHOULDER ARTHROSCOPY WITH MINI OPEN ROTATOR CUFF REPAIR AND DISTAL CLAVICLE RESECTION, SUBACROMIAL DECOMPRESSION, POSSIBLE [facility] PATCH.;  Surgeon: [name], MD;  Location: [facility];  Service: Orthopedics;  Laterality: Right;  . TOTAL KNEE ARTHROPLASTY Right 03/21/2017  . TOTAL KNEE ARTHROPLASTY Right 03/21/2017   Procedure: RIGHT TOTAL KNEE ARTHROPLASTY;  Surgeon: [name], MD;  Location: [facility];  Service: Orthopedics;  Laterality: Right;  . TUBAL LIGATION      There were no vitals  filed for this visit.  Subjective Assessment - 01/09/19 1031    Subjective  Pt reports some soreness after last session, nothing major. Pain today before session fairly good at 1/10.    Pertinent History  Pt is a 68 year old female s/p L reverse total shoulder 12/11/18. Reports she was immobilized in her sling for 2 weeks. After immobilization has been using passive motion machine for flexion. Has been doing AAROM flexion/abd/ER, bicep, shrugs, and forward punches with 1# with HHPT over the past week. MD Marlou Sa advised patient for out of sling, AAROM, PROM with no lifting of more than 5# until next followup with him (mid Oct). Pt reports only 2/10 at rest, 6/10 with motion. Pain is at the medial upper arm  and in the armpit. Pain is worse during the evening after moving it all day. Patient lives with her husband and her 4 dogs, which she walks frequently and cares for. She is R handed.Pt denies N/V, B&B changes, unexplained weight fluctuation, saddle paresthesia, fever, night sweats, or unrelenting night pain at this time.Pt denies N/V, B&B changes, unexplained weight fluctuation, saddle paresthesia, fever, night sweats, or unrelenting night pain at this time.    Currently in Pain?  Yes    Pain Score  1     Pain Orientation  --   Left shoulder      INTERVENTION THIS DATE -Pulleys flex, scaption, abdct x15 each direction with 3sec holds in minimal pain ROMUT stretch x30sec hold with education on postural carry over of proper scapular positioning *modified ABDCT pulley this date to bent elbow and external rotation to tolerance -Left elbow flexion A/ROM x10 (standing), c 2lb free weight -Standing bilat shoulder elevation, 5lb free weights bilat; multimodal cues for form, and neutral head position, 1x10 -Bilat Scapular elevation + cervical rotation with neutral chin 1x10 bilat *good stabilization of cervical spine, but tightness on left cervical spine limited Right rotation, causes extension deviation at end range.  -isometric shoulder extension (elbow into door frame) + scapular retraction 15x3secH -isometric shoulder external rotation (wrist into door frame) 1x15x3secH *heavy cues to avoid abduction -Cervical retraction into 3 pillows 15x3secH  -MET for shoulder external rotation stretch at 45 degrees, 10x15sec stretch -Left shoulder (short lever) scaption to 90 degrees, supine, AA/ROM (2x15), with scap protraction -MFR to clavicular pec major x5 minutes -Left ER sin supine, hand from belly to 0 degrees 2x15   *extensive verbal, tactile, and visual cues given for correct performance or exercises and indication of proprioceptive region.    PT Short Term Goals - 01/02/19 1121      PT SHORT  TERM GOAL #1   Title  Pt will be independent with HEP in order to improve strength and balance in order to improve function at home    Baseline  01/02/19    Time  4    Period  Weeks    Status  New        PT Long Term Goals - 01/02/19 1115      PT LONG TERM GOAL #1   Title  Pt will decrease quick DASH score by at least 8% in order to demonstrate clinically significant reduction in disability.    Baseline  01/02/19 79.5%    Time  8    Period  Weeks    Status  New      PT LONG TERM GOAL #2   Title  Patient will demonstrate full AROM in L shoulder in order to complete household ADLs  Baseline  01/02/19 see eval    Time  8    Period  Weeks    Status  New      PT LONG TERM GOAL #3   Title  Pt will decrease worst pain as reported on NPRS by at least 3 points in order to demonstrate clinically significant reduction in pain.    Baseline  01/02/19 6/10 pain with movement    Time  8    Period  Weeks    Status  New      PT LONG TERM GOAL #4   Title  Pt will demonstrate gross bilat shoulder and periscapular strength of 4/10 in order to safely lift for heavy household chores    Baseline  01/02/19 see eval    Time  8    Period  Weeks    Status  New            Plan - 01/09/19 1033    Clinical Impression Statement  In general, patient demonstrating good tolerance to therapy session this date, reasonable accommodations are alllowed in-session to allow adequate rest between activities as needed. All interventional executed without any exacerbation of pain or other symptoms. Pt demonstrates focused motivation to fully participate in therapy to the best of ability. Pt is able to progress some strengthening interventions as well as some balance interventions, although strength deficits remain obvious to author, as well as easy fatiguability.  Pt given intermittent multimodal cues to teach best possible form with exercises. Reviewed postoperative restrictions with patient. ER ROM is tolerated  without permitted range, as is flexion. Pt continues to make steady progress toward most goals. No home exercise updates made at this time.    Rehab Potential  Good    PT Frequency  2x / week    PT Duration  8 weeks    PT Treatment/Interventions  ADLs/Self Care Home Management;Electrical Stimulation;Therapeutic activities;Passive range of motion;Manual techniques;Patient/family education;Therapeutic exercise;Iontophoresis 51m/ml Dexamethasone;Moist Heat;Traction;Cryotherapy;Ultrasound;Functional mobility training;Neuromuscular re-education;Balance training;Dry needling;Joint Manipulations    PT Next Visit Plan  HEP review, shoulder motion    PT Home Exercise Plan  AAROM cane, pulleys, grip strength, UT stretch    Consulted and Agree with Plan of Care  Patient       Patient will benefit from skilled therapeutic intervention in order to improve the following deficits and impairments:  Decreased mobility, Decreased endurance, Pain, Postural dysfunction, Impaired UE functional use, Impaired flexibility, Increased fascial restricitons, Decreased strength, Decreased coordination, Decreased range of motion, Improper body mechanics  Visit Diagnosis: Acute pain of left shoulder  Stiffness of left shoulder, not elsewhere classified  Muscle weakness (generalized)     Problem List Patient Active Problem List   Diagnosis Date Noted  . 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 Chronic left shoulder pain 08/23/2018  . Dysuria 08/06/2018  . History of total right knee replacement 03/21/2017  . Chronic diarrhea 12/07/2014  . [place] 09/12/2014  . Routine general medical examination at a health care facility 06/05/2014  . Overweight 06/13/2011  . ABNORMAL CHEST XRAY 09/30/2007  . Hyperlipidemia 02/17/2007  . Essential hypertension 01/08/2007  . Osteoarthritis 01/08/2007   11:17 AM, 01/09/19 AEtta Grandchild PT, DPT Physical Therapist - [phone](Office)    Jaisen Wiltrout C 01/09/2019, 10:36 AM  [facility] [address] [place] [address]Phone: [phone]  Fax:  [phone] Name: Stacy MARSIGLIAMRN: [id number]  Date of Birth: 07-08-1950

## 2019-01-11 ENCOUNTER — Other Ambulatory Visit (INDEPENDENT_AMBULATORY_CARE_PROVIDER_SITE_OTHER): Payer: Medicare Other

## 2019-01-11 ENCOUNTER — Other Ambulatory Visit: Payer: Self-pay

## 2019-01-11 ENCOUNTER — Ambulatory Visit (INDEPENDENT_AMBULATORY_CARE_PROVIDER_SITE_OTHER): Payer: Medicare Other | Admitting: Internal Medicine

## 2019-01-11 ENCOUNTER — Encounter: Payer: Self-pay | Admitting: Internal Medicine

## 2019-01-11 VITALS — BP 100/60 | HR 71 | Temp 97.9°F | Ht 62.0 in | Wt 187.0 lb

## 2019-01-11 DIAGNOSIS — Z23 Encounter for immunization: Secondary | ICD-10-CM

## 2019-01-11 DIAGNOSIS — K529 Noninfective gastroenteritis and colitis, unspecified: Secondary | ICD-10-CM | POA: Diagnosis not present

## 2019-01-11 DIAGNOSIS — I10 Essential (primary) hypertension: Secondary | ICD-10-CM | POA: Diagnosis not present

## 2019-01-11 DIAGNOSIS — E782 Mixed hyperlipidemia: Secondary | ICD-10-CM | POA: Diagnosis not present

## 2019-01-11 DIAGNOSIS — Z0000 Encounter for general adult medical examination without abnormal findings: Secondary | ICD-10-CM | POA: Diagnosis not present

## 2019-01-11 LAB — COMPREHENSIVE METABOLIC PANEL
ALT: 19 U/L (ref 0–35)
AST: 30 U/L (ref 0–37)
Albumin: 4.3 g/dL (ref 3.5–5.2)
Alkaline Phosphatase: 71 U/L (ref 39–117)
BUN: 18 mg/dL (ref 6–23)
CO2: 30 mEq/L (ref 19–32)
Calcium: 10.5 mg/dL (ref 8.4–10.5)
Chloride: 101 mEq/L (ref 96–112)
Creatinine, Ser: 0.99 mg/dL (ref 0.40–1.20)
GFR: 55.69 mL/min — ABNORMAL LOW (ref >60.00)
Glucose, Bld: 102 mg/dL — ABNORMAL HIGH (ref 70–99)
Potassium: 4.7 mEq/L (ref 3.5–5.1)
Sodium: 139 mEq/L (ref 135–145)
Total Bilirubin: 0.5 mg/dL (ref 0.2–1.2)
Total Protein: 7.6 g/dL (ref 6.0–8.3)

## 2019-01-11 LAB — CBC
HCT: 40.6 % (ref 36.0–46.0)
Hemoglobin: 13.3 g/dL (ref 12.0–15.0)
MCHC: 32.7 g/dL (ref 30.0–36.0)
MCV: 85.5 fl (ref 78.0–100.0)
Platelets: 276 10*3/uL (ref 150.0–400.0)
RBC: 4.75 Mil/uL (ref 3.87–5.11)
RDW: 14 % (ref 11.5–15.5)
WBC: 8 10*3/uL (ref 4.0–10.5)

## 2019-01-11 LAB — LIPID PANEL
Cholesterol: 152 mg/dL (ref 0–200)
HDL: 38 mg/dL — ABNORMAL LOW (ref >39.00)
LDL Cholesterol: 89 mg/dL (ref 0–99)
NonHDL: 114.14
Total CHOL/HDL Ratio: 4
Triglycerides: 126 mg/dL (ref 0.0–149.0)
VLDL: 25.2 mg/dL (ref 0.0–40.0)

## 2019-01-11 LAB — TSH: TSH: 2.28 u[IU]/mL (ref 0.35–4.50)

## 2019-01-11 MED ORDER — DIPHENOXYLATE-ATROPINE 2.5-0.025 MG PO TABS: 1.0000 | ORAL_TABLET | Freq: Four times a day (QID) | ORAL | 3 refills | Status: DC | PRN

## 2019-01-11 NOTE — Assessment & Plan Note (Signed)
Flu shot given. Pneumonia complete. Shingrix counseled declines. Tetanus due 2022. Colonoscopy due now declines. Mammogram due now declines, pap smear aged out and dexa due declines now. Counseled about sun safety and mole surveillance. Counseled about the dangers of distracted driving. Given 10 year screening recommendations.

## 2019-01-11 NOTE — Assessment & Plan Note (Signed)
BP at goal on atenolol/chlorthalidone and lisinopril. Checking CMP and adjust as needed.

## 2019-01-11 NOTE — Patient Instructions (Signed)
Health Maintenance, Female Adopting a healthy lifestyle and getting preventive care are important in promoting health and wellness. Ask your health care provider about:  The right schedule for you to have regular tests and exams.  Things you can do on your own to prevent diseases and keep yourself healthy. What should I know about diet, weight, and exercise? Eat a healthy diet   Eat a diet that includes plenty of vegetables, fruits, low-fat dairy products, and lean protein.  Do not eat a lot of foods that are high in solid fats, added sugars, or sodium. Maintain a healthy weight Body mass index (BMI) is used to identify weight problems. It estimates body fat based on height and weight. Your health care provider can help determine your BMI and help you achieve or maintain a healthy weight. Get regular exercise Get regular exercise. This is one of the most important things you can do for your health. Most adults should:  Exercise for at least 150 minutes each week. The exercise should increase your heart rate and make you sweat (moderate-intensity exercise).  Do strengthening exercises at least twice a week. This is in addition to the moderate-intensity exercise.  Spend less time sitting. Even light physical activity can be beneficial. Watch cholesterol and blood lipids Have your blood tested for lipids and cholesterol at 68 years of age, then have this test every 5 years. Have your cholesterol levels checked more often if:  Your lipid or cholesterol levels are high.  You are older than 68 years of age.  You are at high risk for heart disease. What should I know about cancer screening? Depending on your health history and family history, you may need to have cancer screening at various ages. This may include screening for:  Breast cancer.  Cervical cancer.  Colorectal cancer.  Skin cancer.  Lung cancer. What should I know about heart disease, diabetes, and high blood  pressure? Blood pressure and heart disease  High blood pressure causes heart disease and increases the risk of stroke. This is more likely to develop in people who have high blood pressure readings, are of African descent, or are overweight.  Have your blood pressure checked: ? Every 3-5 years if you are 18-39 years of age. ? Every year if you are 40 years old or older. Diabetes Have regular diabetes screenings. This checks your fasting blood sugar level. Have the screening done:  Once every three years after age 40 if you are at a normal weight and have a low risk for diabetes.  More often and at a younger age if you are overweight or have a high risk for diabetes. What should I know about preventing infection? Hepatitis B If you have a higher risk for hepatitis B, you should be screened for this virus. Talk with your health care provider to find out if you are at risk for hepatitis B infection. Hepatitis C Testing is recommended for:  Everyone born from 1945 through 1965.  Anyone with known risk factors for hepatitis C. Sexually transmitted infections (STIs)  Get screened for STIs, including gonorrhea and chlamydia, if: ? You are sexually active and are younger than 68 years of age. ? You are older than 68 years of age and your health care provider tells you that you are at risk for this type of infection. ? Your sexual activity has changed since you were last screened, and you are at increased risk for chlamydia or gonorrhea. Ask your health care provider if   you are at risk.  Ask your health care provider about whether you are at high risk for HIV. Your health care provider may recommend a prescription medicine to help prevent HIV infection. If you choose to take medicine to prevent HIV, you should first get tested for HIV. You should then be tested every 3 months for as long as you are taking the medicine. Pregnancy  If you are about to stop having your period (premenopausal) and  you may become pregnant, seek counseling before you get pregnant.  Take 400 to 800 micrograms (mcg) of folic acid every day if you become pregnant.  Ask for birth control (contraception) if you want to prevent pregnancy. Osteoporosis and menopause Osteoporosis is a disease in which the bones lose minerals and strength with aging. This can result in bone fractures. If you are 65 years old or older, or if you are at risk for osteoporosis and fractures, ask your health care provider if you should:  Be screened for bone loss.  Take a calcium or vitamin D supplement to lower your risk of fractures.  Be given hormone replacement therapy (HRT) to treat symptoms of menopause. Follow these instructions at home: Lifestyle  Do not use any products that contain nicotine or tobacco, such as cigarettes, e-cigarettes, and chewing tobacco. If you need help quitting, ask your health care provider.  Do not use street drugs.  Do not share needles.  Ask your health care provider for help if you need support or information about quitting drugs. Alcohol use  Do not drink alcohol if: ? Your health care provider tells you not to drink. ? You are pregnant, may be pregnant, or are planning to become pregnant.  If you drink alcohol: ? Limit how much you use to 0-1 drink a day. ? Limit intake if you are breastfeeding.  Be aware of how much alcohol is in your drink. In the [place] one drink equals one 12 oz bottle of beer (355 mL), one 5 oz glass of wine (148 mL), or one 1 oz glass of hard liquor (44 mL). General instructions  Schedule regular health, dental, and eye exams.  Stay current with your vaccines.  Tell your health care provider if: ? You often feel depressed. ? You have ever been abused or do not feel safe at home. Summary  Adopting a healthy lifestyle and getting preventive care are important in promoting health and wellness.  Follow your health care provider's instructions about healthy  diet, exercising, and getting tested or screened for diseases.  Follow your health care provider's instructions on monitoring your cholesterol and blood pressure. This information is not intended to replace advice given to you by your health care provider. Make sure you discuss any questions you have with your health care provider. Document Released: 10/11/2010 Document Revised: 03/21/2018 Document Reviewed: 03/21/2018 Elsevier Patient Education  2020 Elsevier Inc.  

## 2019-01-11 NOTE — Progress Notes (Signed)
Subjective:   Patient ID: [name], female    DOB: July 28, 1950, 68 y.o.   MRN: [id number]  HPI Here for medicare wellness, no new complaints. Please see A/P for status and treatment of chronic medical problems.   HPI #2: Here for follow up of blood pressure (taking atenolol/chlorthalidone and lisinopril, denies side effects, denies headaches or chest pains, does not check BP at home), and cholesterol (taking simvastatin, denies side effects, denies chest pains or strokes symptoms, diet is stable exercise poor), and chronic diarrhea (uses lomotil when needed for the diarrhea to help, denies using more than once a week or so, tries to maintain regular diet but sometimes she gets flares even with same foods for unclear reasons, due for colonoscopy but just got shoulder replacement and cannot do now, denies blood in stool, denies weight loss).   Diet: heart healthy Physical activity: sedentary Depression/mood screen: negative Hearing: intact to whispered voice Visual acuity: grossly normal, performs annual eye exam  ADLs: capable Fall risk: none Home safety: good Cognitive evaluation: intact to orientation, naming, recall and repetition EOL planning: adv directives discussed    Office Visit from 01/11/2019 in [place]  PHQ-2 Total Score  0      I have personally reviewed and have noted 1. The patient's medical and social history - reviewed today no changes 2. Their use of alcohol, tobacco or illicit drugs 3. Their current medications and supplements 4. The patient's functional ability including ADL's, fall risks, home safety risks and hearing or visual impairment. 5. Diet and physical activities 6. Evidence for depression or mood disorders 7. Care team reviewed and updated  Patient Care Team: Hoyt Koch, MD as PCP - General (Internal Medicine) Past Medical History:  Diagnosis Date  . Arthritis    back, fingers with joint pain and  swelling.  chronic back pain  . Chronic back pain    arthritis   . Diverticulosis   . Elevated cholesterol    takes Niacin daily and Simvastatin  . GERD (gastroesophageal reflux disease) 06/2004   non-specific gastritis on EGD 06/2004  . Headache(784.0)    occasionally  . History of colon polyps 2004, 2009   2004:adenomatous. 2009 hyperplastic.   Marland Kitchen History of hiatal hernia   . Hypertension    takes Tenoretic and Lisinopril daily  . Mucoid cyst of joint 08/2013   right index finger  . Osteopenia 01/2014   T score -1.1 FRAX 14%/0.5%. Stable from prior DEXA  . PONV (postoperative nausea and vomiting)   . Rotator cuff arthropathy    Left  . Seasonal allergies   . Stroke ([place]) 1998   x 2 - mild left-sided weakness  . Urge incontinence   . Uterine prolapse    Past Surgical History:  Procedure Laterality Date  . BACK SURGERY     x 2  . BELPHAROPTOSIS REPAIR Bilateral 12/14/2017  . CATARACT EXTRACTION    . CATARACT EXTRACTION Bilateral 10/2017  . COLONOSCOPY  2004, 2009, 2014  . FINGER SURGERY    . GUM SURGERY    . GYNECOLOGIC CRYOSURGERY    . HYSTEROSCOPY W/D&C  12/07/2010   with resection of endometrial polyp  . JOINT REPLACEMENT    . KNEE ARTHROSCOPY Left 2008  . lip biopsy     done at MD office Fri 11/22/13  . MASS EXCISION Right 08/15/2013   Procedure: RIGHT INDEX EXCISION MASS ;  Surgeon: Tennis Must, MD;  Location: [facility];  Service: Orthopedics;  Laterality: Right;  . NASAL SEPTUM SURGERY    . OOPHORECTOMY Right 2000  . REVERSE SHOULDER ARTHROPLASTY Left 12/11/2018  . REVERSE SHOULDER ARTHROPLASTY Left 12/11/2018   Procedure: LEFT REVERSE SHOULDER ARTHROPLASTY;  Surgeon: Meredith Pel, MD;  Location: [facility];  Service: Orthopedics;  Laterality: Left;  . SHOULDER ARTHROSCOPY WITH OPEN ROTATOR CUFF REPAIR AND DISTAL CLAVICLE ACROMINECTOMY Right 11/26/2013   Procedure: RIGHT SHOULDER ARTHROSCOPY WITH MINI OPEN ROTATOR CUFF REPAIR AND DISTAL CLAVICLE  RESECTION, SUBACROMIAL DECOMPRESSION, POSSIBLE [facility] PATCH.;  Surgeon: [name], MD;  Location: [facility];  Service: Orthopedics;  Laterality: Right;  . TOTAL KNEE ARTHROPLASTY Right 03/21/2017  . TOTAL KNEE ARTHROPLASTY Right 03/21/2017   Procedure: RIGHT TOTAL KNEE ARTHROPLASTY;  Surgeon: [name], MD;  Location: [facility];  Service: Orthopedics;  Laterality: Right;  . TUBAL LIGATION     Family History  Problem Relation Age of Onset  . Hypertension Mother   . Heart disease Mother   . Diabetes Father   . Hypertension Father   . Heart disease Father   . Stroke Father   . Diabetes Maternal Aunt   . Diabetes Paternal Grandmother   . Hypertension Paternal Grandfather   . Stroke Paternal Grandfather    Review of Systems  Constitutional: Negative.   HENT: Negative.   Eyes: Negative.   Respiratory: Negative for cough, chest tightness and shortness of breath.   Cardiovascular: Negative for chest pain, palpitations and leg swelling.  Gastrointestinal: Negative for abdominal distention, abdominal pain, constipation, diarrhea, nausea and vomiting.  Musculoskeletal: Negative.   Skin: Negative.   Neurological: Negative.   Psychiatric/Behavioral: Negative.     Objective:  Physical Exam Constitutional:      Appearance: She is well-developed.  HENT:     Head: Normocephalic and atraumatic.  Neck:     Musculoskeletal: Normal range of motion.  Cardiovascular:     Rate and Rhythm: Normal rate and regular rhythm.  Pulmonary:     Effort: Pulmonary effort is normal. No respiratory distress.     Breath sounds: Normal breath sounds. No wheezing or rales.  Abdominal:     General: Bowel sounds are normal. There is no distension.     Palpations: Abdomen is soft.     Tenderness: There is no abdominal tenderness. There is no rebound.  Skin:    General: Skin is warm and dry.  Neurological:     Mental Status: She is alert and oriented to person, place, and time.     Coordination:  Coordination normal.     Vitals:   01/11/19 1010  BP: 100/60  Pulse: 71  Temp: 97.9 F (36.6 C)  TempSrc: Oral  SpO2: 98%  Weight: 187 lb (84.8 kg)  Height: 5' 2"  (1.575 m)    Assessment & Plan:  Flu shot given at visit

## 2019-01-11 NOTE — Assessment & Plan Note (Signed)
Refill lomotil. Reminded of need for colonoscopy and she will attempt to get this done within the year.

## 2019-01-11 NOTE — Assessment & Plan Note (Signed)
Checking lipid panel and adjust simvastatin as needed.

## 2019-01-14 ENCOUNTER — Other Ambulatory Visit: Payer: Self-pay

## 2019-01-14 ENCOUNTER — Ambulatory Visit: Payer: Medicare Other | Attending: Orthopedic Surgery

## 2019-01-14 DIAGNOSIS — M25612 Stiffness of left shoulder, not elsewhere classified: Secondary | ICD-10-CM | POA: Diagnosis not present

## 2019-01-14 DIAGNOSIS — M6281 Muscle weakness (generalized): Secondary | ICD-10-CM | POA: Diagnosis not present

## 2019-01-14 DIAGNOSIS — M25512 Pain in left shoulder: Secondary | ICD-10-CM

## 2019-01-14 NOTE — Therapy (Signed)
[facility] [address] [place], [address] Phone: [phone]   Fax:  [phone]  Physical Therapy Treatment  Patient Details  Name: [name] MRN: [id number] Date of Birth: November 12, 1950 Referring Provider (PT): Lenon Oms MD   Encounter Date: 01/14/2019  PT End of Session - 01/14/19 1257    Visit Number  5    Number of Visits  17    Date for PT Re-Evaluation  02/27/19    PT Start Time  1254    PT Stop Time  5374    PT Time Calculation (min)  40 min    Activity Tolerance  Patient tolerated treatment well;No increased pain    Behavior During Therapy  WFL for tasks assessed/performed       Past Medical History:  Diagnosis Date  . Arthritis    back, fingers with joint pain and swelling.  chronic back pain  . Chronic back pain    arthritis   . Diverticulosis   . Elevated cholesterol    takes Niacin daily and Simvastatin  . GERD (gastroesophageal reflux disease) 06/2004   non-specific gastritis on EGD 06/2004  . Headache(784.0)    occasionally  . History of colon polyps 2004, 2009   2004:adenomatous. 2009 hyperplastic.   Marland Kitchen History of hiatal hernia   . Hypertension    takes Tenoretic and Lisinopril daily  . Mucoid cyst of joint 08/2013   right index finger  . Osteopenia 01/2014   T score -1.1 FRAX 14%/0.5%. Stable from prior DEXA  . PONV (postoperative nausea and vomiting)   . Rotator cuff arthropathy    Left  . Seasonal allergies   . Stroke ([place]) 1998   x 2 - mild left-sided weakness  . Urge incontinence   . Uterine prolapse     Past Surgical History:  Procedure Laterality Date  . BACK SURGERY     x 2  . BELPHAROPTOSIS REPAIR Bilateral 12/14/2017  . CATARACT EXTRACTION    . CATARACT EXTRACTION Bilateral 10/2017  . COLONOSCOPY  2004, 2009, 2014  . FINGER SURGERY    . GUM SURGERY    . GYNECOLOGIC CRYOSURGERY    . HYSTEROSCOPY W/D&C  12/07/2010   with resection of endometrial  polyp  . JOINT REPLACEMENT    . KNEE ARTHROSCOPY Left 2008  . lip biopsy     done at MD office Fri 11/22/13  . MASS EXCISION Right 08/15/2013   Procedure: RIGHT INDEX EXCISION MASS ;  Surgeon: Tennis Must, MD;  Location: [facility];  Service: Orthopedics;  Laterality: Right;  . NASAL SEPTUM SURGERY    . OOPHORECTOMY Right 2000  . REVERSE SHOULDER ARTHROPLASTY Left 12/11/2018  . REVERSE SHOULDER ARTHROPLASTY Left 12/11/2018   Procedure: LEFT REVERSE SHOULDER ARTHROPLASTY;  Surgeon: Meredith Pel, MD;  Location: [facility];  Service: Orthopedics;  Laterality: Left;  . SHOULDER ARTHROSCOPY WITH OPEN ROTATOR CUFF REPAIR AND DISTAL CLAVICLE ACROMINECTOMY Right 11/26/2013   Procedure: RIGHT SHOULDER ARTHROSCOPY WITH MINI OPEN ROTATOR CUFF REPAIR AND DISTAL CLAVICLE RESECTION, SUBACROMIAL DECOMPRESSION, POSSIBLE [facility] PATCH.;  Surgeon: [name], MD;  Location: [facility];  Service: Orthopedics;  Laterality: Right;  . TOTAL KNEE ARTHROPLASTY Right 03/21/2017  . TOTAL KNEE ARTHROPLASTY Right 03/21/2017   Procedure: RIGHT TOTAL KNEE ARTHROPLASTY;  Surgeon: [name], MD;  Location: [facility];  Service: Orthopedics;  Laterality: Right;  . TUBAL LIGATION      There were no vitals  filed for this visit.  Subjective Assessment - 01/14/19 1256    Subjective  Pt reports shoulder is "ok" today, nothing very different since last week. Pt reports a busy weekend with a new puppy. Shoulder felt a little sore afte rlast session, but not painful.    Pertinent History  Pt is a 68 year old female s/p L reverse total shoulder 12/11/18. Reports she was immobilized in her sling for 2 weeks. After immobilization has been using passive motion machine for flexion. Has been doing AAROM flexion/abd/ER, bicep, shrugs, and forward punches with 1# with HHPT over the past week. MD Marlou Sa advised patient for out of sling, AAROM, PROM with no lifting of more than 5# until next followup with him (mid Oct). Pt  reports only 2/10 at rest, 6/10 with motion. Pain is at the medial upper arm and in the armpit. Pain is worse during the evening after moving it all day. Patient lives with her husband and her 4 dogs, which she walks frequently and cares for. She is R handed.Pt denies N/V, B&B changes, unexplained weight fluctuation, saddle paresthesia, fever, night sweats, or unrelenting night pain at this time.Pt denies N/V, B&B changes, unexplained weight fluctuation, saddle paresthesia, fever, night sweats, or unrelenting night pain at this time.    Currently in Pain?  Yes    Pain Score  4     Pain Location  --   Left shoulder      INTERVENTION THIS DATE -Pulleys flex, abdct x20 each direction with 3sec holds in minimal pain ROMUT stretch x30sec hold with education on postural carry over of proper scapular positioning -Left elbow flexion A/ROM x20 (standing), c 2lb free weight -Standing bilat shoulder elevation, 5lb free weights bilat; multimodal cues for form, and neutral head position, 1x10 -Cervical retraction into 2 pillows 15x3secH  -Left shoulder external rotation stretch to 45 degrees, 45 degrees scaption, 10x5sec  -Pec major ART x 4 minutes -scar mobilization x 3 minutes -isometric shoulder extension into plinth (supine) 15x3secH -Left ER in supine, hand from belly to 0 degrees 1x15, 1x10 c 1lb weight  -isometric shoulder extension (elbow into door frame) + scapular retraction 15x3secH -Left shoulder (short lever) scaption to 90 degrees, supine, AA/ROM (1x15, 1x10 c 1lb), with scap protraction -Left long lever abduction in sidelying 0-90 degrees with AA/ROM 1x15 -Left skull crusher unloaded to teach Left GHJ stabilization 1x15   *extensive verbal, tactile, and visual cues given for correct performance or exercises and indication of proprioceptive region.     PT Short Term Goals - 01/02/19 1121      PT SHORT TERM GOAL #1   Title  Pt will be independent with HEP in order to improve strength  and balance in order to improve function at home    Baseline  01/02/19    Time  4    Period  Weeks    Status  New        PT Long Term Goals - 01/02/19 1115      PT LONG TERM GOAL #1   Title  Pt will decrease quick DASH score by at least 8% in order to demonstrate clinically significant reduction in disability.    Baseline  01/02/19 79.5%    Time  8    Period  Weeks    Status  New      PT LONG TERM GOAL #2   Title  Patient will demonstrate full AROM in L shoulder in order to complete household ADLs  Baseline  01/02/19 see eval    Time  8    Period  Weeks    Status  New      PT LONG TERM GOAL #3   Title  Pt will decrease worst pain as reported on NPRS by at least 3 points in order to demonstrate clinically significant reduction in pain.    Baseline  01/02/19 6/10 pain with movement    Time  8    Period  Weeks    Status  New      PT LONG TERM GOAL #4   Title  Pt will demonstrate gross bilat shoulder and periscapular strength of 4/10 in order to safely lift for heavy household chores    Baseline  01/02/19 see eval    Time  8    Period  Weeks    Status  New            Plan - 01/14/19 1258    Clinical Impression Statement In general, patient demonstrating good tolerance to therapy session this date, minimal rest breaks needed. All interventional executed without any lasting exacerbation of resting pain, although some exercises are more sore than others.  Pt demonstrates focused motivation to fully participate in therapy to the best of ability, remains calm and focused thorughout. Pt is able to progress some strengthening interventions as well as some balance interventions, although postoperative precautions are maintianed. Pt given intermittent multimodal cues to teach best possible form with exercises. Pt continues to make steady progress toward most goals. No home exercise updates made at this time.    Rehab Potential  Good    PT Frequency  2x / week    PT Duration  8 weeks     PT Treatment/Interventions  ADLs/Self Care Home Management;Electrical Stimulation;Therapeutic activities;Passive range of motion;Manual techniques;Patient/family education;Therapeutic exercise;Iontophoresis 51m/ml Dexamethasone;Moist Heat;Traction;Cryotherapy;Ultrasound;Functional mobility training;Neuromuscular re-education;Balance training;Dry needling;Joint Manipulations    PT Next Visit Plan  Continue to gently progress mobility and strength    PT Home Exercise Plan  AAROM cane, pulleys, grip strength, UT stretch    Consulted and Agree with Plan of Care  Patient       Patient will benefit from skilled therapeutic intervention in order to improve the following deficits and impairments:  Decreased mobility, Decreased endurance, Pain, Postural dysfunction, Impaired UE functional use, Impaired flexibility, Increased fascial restricitons, Decreased strength, Decreased coordination, Decreased range of motion, Improper body mechanics  Visit Diagnosis: Acute pain of left shoulder  Stiffness of left shoulder, not elsewhere classified  Muscle weakness (generalized)     Problem List Patient Active Problem List   Diagnosis Date Noted  . 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 Chronic left shoulder pain 08/23/2018  . Chronic diarrhea 12/07/2014  . Segmental colitis with rectal bleeding [place] 09/12/2014  . Routine general medical examination at a health care facility 06/05/2014  . Overweight 06/13/2011  . ABNORMAL CHEST XRAY 09/30/2007  . Hyperlipidemia 02/17/2007  . Essential hypertension 01/08/2007  . Osteoarthritis 01/08/2007   1:27 PM, 01/14/19 AEtta Grandchild PT, DPT Physical Therapist - [facility] [phone]([place]    [place] 01/14/2019, 1:00 PM  [facility] [address] [place] [address]Phone: [phone]  Fax:  [phone] Name: Stacy WATSONMRN: [id number] of Birth: 318-Apr-1952

## 2019-01-16 ENCOUNTER — Ambulatory Visit: Payer: Medicare Other

## 2019-01-16 ENCOUNTER — Other Ambulatory Visit: Payer: Self-pay

## 2019-01-16 ENCOUNTER — Encounter: Payer: Self-pay | Admitting: Gynecology

## 2019-01-16 DIAGNOSIS — M25612 Stiffness of left shoulder, not elsewhere classified: Secondary | ICD-10-CM

## 2019-01-16 DIAGNOSIS — M6281 Muscle weakness (generalized): Secondary | ICD-10-CM

## 2019-01-16 DIAGNOSIS — M25512 Pain in left shoulder: Secondary | ICD-10-CM | POA: Diagnosis not present

## 2019-01-16 NOTE — Therapy (Signed)
[facility] [address] [place], [address] Phone: [phone]   Fax:  [phone]  Physical Therapy Treatment  Patient Details  Name: [name] MRN: [id number] Date of Birth: 19-Jan-1951 Referring Provider (PT): Lenon Oms MD   Encounter Date: 01/16/2019  PT End of Session - 01/16/19 1351    Visit Number  6    Number of Visits  17    Date for PT Re-Evaluation  02/27/19    PT Start Time  9163    PT Stop Time  1425    PT Time Calculation (min)  40 min    Activity Tolerance  Patient tolerated treatment well;No increased pain    Behavior During Therapy  WFL for tasks assessed/performed       Past Medical History:  Diagnosis Date  . Arthritis    back, fingers with joint pain and swelling.  chronic back pain  . Chronic back pain    arthritis   . Diverticulosis   . Elevated cholesterol    takes Niacin daily and Simvastatin  . GERD (gastroesophageal reflux disease) 06/2004   non-specific gastritis on EGD 06/2004  . Headache(784.0)    occasionally  . History of colon polyps 2004, 2009   2004:adenomatous. 2009 hyperplastic.   Marland Kitchen History of hiatal hernia   . Hypertension    takes Tenoretic and Lisinopril daily  . Mucoid cyst of joint 08/2013   right index finger  . Osteopenia 01/2014   T score -1.1 FRAX 14%/0.5%. Stable from prior DEXA  . PONV (postoperative nausea and vomiting)   . Rotator cuff arthropathy    Left  . Seasonal allergies   . Stroke ([place]) 1998   x 2 - mild left-sided weakness  . Urge incontinence   . Uterine prolapse     Past Surgical History:  Procedure Laterality Date  . BACK SURGERY     x 2  . BELPHAROPTOSIS REPAIR Bilateral 12/14/2017  . CATARACT EXTRACTION    . CATARACT EXTRACTION Bilateral 10/2017  . COLONOSCOPY  2004, 2009, 2014  . FINGER SURGERY    . GUM SURGERY    . GYNECOLOGIC CRYOSURGERY    . HYSTEROSCOPY W/D&C  12/07/2010   with resection of endometrial  polyp  . JOINT REPLACEMENT    . KNEE ARTHROSCOPY Left 2008  . lip biopsy     done at MD office Fri 11/22/13  . MASS EXCISION Right 08/15/2013   Procedure: RIGHT INDEX EXCISION MASS ;  Surgeon: Tennis Must, MD;  Location: [facility];  Service: Orthopedics;  Laterality: Right;  . NASAL SEPTUM SURGERY    . OOPHORECTOMY Right 2000  . REVERSE SHOULDER ARTHROPLASTY Left 12/11/2018  . REVERSE SHOULDER ARTHROPLASTY Left 12/11/2018   Procedure: LEFT REVERSE SHOULDER ARTHROPLASTY;  Surgeon: Meredith Pel, MD;  Location: [facility];  Service: Orthopedics;  Laterality: Left;  . SHOULDER ARTHROSCOPY WITH OPEN ROTATOR CUFF REPAIR AND DISTAL CLAVICLE ACROMINECTOMY Right 11/26/2013   Procedure: RIGHT SHOULDER ARTHROSCOPY WITH MINI OPEN ROTATOR CUFF REPAIR AND DISTAL CLAVICLE RESECTION, SUBACROMIAL DECOMPRESSION, POSSIBLE [facility] PATCH.;  Surgeon: [name], MD;  Location: [facility];  Service: Orthopedics;  Laterality: Right;  . TOTAL KNEE ARTHROPLASTY Right 03/21/2017  . TOTAL KNEE ARTHROPLASTY Right 03/21/2017   Procedure: RIGHT TOTAL KNEE ARTHROPLASTY;  Surgeon: [name], MD;  Location: [facility] [place] [facility];  Service: Orthopedics;  Laterality: Right;  . TUBAL LIGATION      There were no vitals  filed for this visit.  Subjective Assessment - 01/16/19 1350    Subjective  Pt doing fine today. Reports she had some aditional soreness after last session 2DA. She has not been icing consistently. She reports unsure if it helps with pain.    Pertinent History  Pt is a 68 year old female s/p L reverse total shoulder 12/11/18. Reports she was immobilized in her sling for 2 weeks. After immobilization has been using passive motion machine for flexion. Has been doing AAROM flexion/abd/ER, bicep, shrugs, and forward punches with 1# with HHPT over the past week. MD Marlou Sa advised patient for out of sling, AAROM, PROM with no lifting of more than 5# until next followup with him (mid Oct). Pt reports only 2/10 at  rest, 6/10 with motion. Pain is at the medial upper arm and in the armpit. Pain is worse during the evening after moving it all day. Patient lives with her husband and her 4 dogs, which she walks frequently and cares for. She is R handed.Pt denies N/V, B&B changes, unexplained weight fluctuation, saddle paresthesia, fever, night sweats, or unrelenting night pain at this time.Pt denies N/V, B&B changes, unexplained weight fluctuation, saddle paresthesia, fever, night sweats, or unrelenting night pain at this time.    Currently in Pain?  No/denies   no pain, just soreness.      INTERVENTION THIS DATE -Pulleys flex, abdct x20 each direction with 3sec holds in minimal pain ROMUT stretch x30sec hold with education on postural carry over of proper scapular positioning -Standing scapular retraction 15x3secH  -Cervical retraction into 2 pillows 15x3secH  -clavicular Pec major MFR, ART with horizontal ABDCT -Standing bilat shoulder elevation, 5lb free weights bilat; multimodal cues for form, and neutral head position, 1x15 -Left ER in supine, hand from belly to 0 degrees 2x10 c 1lb weight  -Left elbow flexion A/ROM x20 (standing), c 2lb free weight -MFR to Left Upper trap -Left skull crusher unloaded to teach Left GHJ stabilization 1x10 c 1lb dumbbell  -Left shoulder external rotation to limits set by protocol easily met, no stretching needed this date; easily able to achieve 45 degrees external rotation scapular plane -Supine Left shoulder flexion 0-90 degrees, long lever 2x10  -rhythmic stabilization 5x10 seconds, supine, flexed at 90 degrees  -Left long lever abduction in sidelying 0-90 degrees with AA/ROM 1x15     *extensive verbal, tactile, and visual cues given for correct performance or exercises and indication of proprioceptive region.    PT Short Term Goals - 01/02/19 1121      PT SHORT TERM GOAL #1   Title  Pt will be independent with HEP in order to improve strength and balance in  order to improve function at home    Baseline  01/02/19    Time  4    Period  Weeks    Status  New        PT Long Term Goals - 01/02/19 1115      PT LONG TERM GOAL #1   Title  Pt will decrease quick DASH score by at least 8% in order to demonstrate clinically significant reduction in disability.    Baseline  01/02/19 79.5%    Time  8    Period  Weeks    Status  New      PT LONG TERM GOAL #2   Title  Patient will demonstrate full AROM in L shoulder in order to complete household ADLs    Baseline  01/02/19 see eval  Time  8    Period  Weeks    Status  New      PT LONG TERM GOAL #3   Title  Pt will decrease worst pain as reported on NPRS by at least 3 points in order to demonstrate clinically significant reduction in pain.    Baseline  01/02/19 6/10 pain with movement    Time  8    Period  Weeks    Status  New      PT LONG TERM GOAL #4   Title  Pt will demonstrate gross bilat shoulder and periscapular strength of 4/10 in order to safely lift for heavy household chores    Baseline  01/02/19 see eval    Time  8    Period  Weeks    Status  New            Plan - 01/16/19 1352    Clinical Impression Statement  Continued to address ROM and strength deficits within guidelines of postoperativ eprotocol. Pt able to complete without pain or fatigue limitations. Pt conitnues to progress well without complication. FU with surgeon next week.    Stability/Clinical Decision Making  Evolving/Moderate complexity    Rehab Potential  Good    PT Frequency  2x / week    PT Duration  8 weeks    PT Treatment/Interventions  ADLs/Self Care Home Management;Electrical Stimulation;Therapeutic activities;Passive range of motion;Manual techniques;Patient/family education;Therapeutic exercise;Iontophoresis 57m/ml Dexamethasone;Moist Heat;Traction;Cryotherapy;Ultrasound;Functional mobility training;Neuromuscular re-education;Balance training;Dry needling;Joint Manipulations    PT Next Visit Plan   Continue to gently progress mobility and strength    PT Home Exercise Plan  AAROM cane, pulleys, grip strength, UT stretch    Consulted and Agree with Plan of Care  Patient       Patient will benefit from skilled therapeutic intervention in order to improve the following deficits and impairments:  Decreased mobility, Decreased endurance, Pain, Postural dysfunction, Impaired UE functional use, Impaired flexibility, Increased fascial restricitons, Decreased strength, Decreased coordination, Decreased range of motion, Improper body mechanics  Visit Diagnosis: Acute pain of left shoulder  Stiffness of left shoulder, not elsewhere classified  Muscle weakness (generalized)     Problem List Patient Active Problem List   Diagnosis Date Noted  . 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 Chronic left shoulder pain 08/23/2018  . Chronic diarrhea 12/07/2014  . Segmental [place] 09/12/2014  . Routine general medical examination at a health care facility 06/05/2014  . Overweight 06/13/2011  . ABNORMAL CHEST XRAY 09/30/2007  . Hyperlipidemia 02/17/2007  . Essential hypertension 01/08/2007  . Osteoarthritis 01/08/2007   2:15 PM, 01/16/19 AEtta Grandchild PT, DPT Physical Therapist - [phone](Office)    Buccola,Allan C 01/16/2019, 1:59 PM  [facility] [address] [place] [address]Phone: [phone]  Fax:  [phone] Name: NAASHNA MATSONMRN: [id number] of Birth: 304-17-1952

## 2019-01-21 ENCOUNTER — Encounter: Payer: Medicare Other | Admitting: Physical Therapy

## 2019-01-22 ENCOUNTER — Ambulatory Visit: Payer: Medicare Other | Admitting: Physical Therapy

## 2019-01-22 ENCOUNTER — Other Ambulatory Visit: Payer: Self-pay

## 2019-01-22 ENCOUNTER — Encounter: Payer: Self-pay | Admitting: Physical Therapy

## 2019-01-22 DIAGNOSIS — M25512 Pain in left shoulder: Secondary | ICD-10-CM

## 2019-01-22 DIAGNOSIS — M6281 Muscle weakness (generalized): Secondary | ICD-10-CM | POA: Diagnosis not present

## 2019-01-22 DIAGNOSIS — M25612 Stiffness of left shoulder, not elsewhere classified: Secondary | ICD-10-CM

## 2019-01-22 NOTE — Therapy (Signed)
[facility] [address], [place], [address] Phone: [phone]   Fax:  [phone]  Physical Therapy Treatment  Patient Details  Name: [name] MRN: [id number] Date of Birth: 01/13/1951 Referring Provider (PT): Lenon Oms MD   Encounter Date: 01/22/2019  PT End of Session - 01/22/19 1124    Visit Number  7    Number of Visits  17    Date for PT Re-Evaluation  02/27/19    PT Start Time  1115    PT Stop Time  1200    PT Time Calculation (min)  45 min    Activity Tolerance  Patient tolerated treatment well;No increased pain    Behavior During Therapy  WFL for tasks assessed/performed       Past Medical History:  Diagnosis Date  . Arthritis    back, fingers with joint pain and swelling.  chronic back pain  . Chronic back pain    arthritis   . Diverticulosis   . Elevated cholesterol    takes Niacin daily and Simvastatin  . GERD (gastroesophageal reflux disease) 06/2004   non-specific gastritis on EGD 06/2004  . Headache(784.0)    occasionally  . History of colon polyps 2004, 2009   2004:adenomatous. 2009 hyperplastic.   Marland Kitchen History of hiatal hernia   . Hypertension    takes Tenoretic and Lisinopril daily  . Mucoid cyst of joint 08/2013   right index finger  . Osteopenia 01/2014   T score -1.1 FRAX 14%/0.5%. Stable from prior DEXA  . PONV (postoperative nausea and vomiting)   . Rotator cuff arthropathy    Left  . Seasonal allergies   . Stroke ([place]) 1998   x 2 - mild left-sided weakness  . Urge incontinence   . Uterine prolapse     Past Surgical History:  Procedure Laterality Date  . BACK SURGERY     x 2  . BELPHAROPTOSIS REPAIR Bilateral 12/14/2017  . CATARACT EXTRACTION    . CATARACT EXTRACTION Bilateral 10/2017  . COLONOSCOPY  2004, 2009, 2014  . FINGER SURGERY    . GUM SURGERY    . GYNECOLOGIC CRYOSURGERY    . HYSTEROSCOPY W/D&C  12/07/2010   with resection of endometrial  polyp  . JOINT REPLACEMENT    . KNEE ARTHROSCOPY Left 2008  . lip biopsy     done at MD office Fri 11/22/13  . MASS EXCISION Right 08/15/2013   Procedure: RIGHT INDEX EXCISION MASS ;  Surgeon: Tennis Must, MD;  Location: [facility];  Service: Orthopedics;  Laterality: Right;  . NASAL SEPTUM SURGERY    . OOPHORECTOMY Right 2000  . REVERSE SHOULDER ARTHROPLASTY Left 12/11/2018  . REVERSE SHOULDER ARTHROPLASTY Left 12/11/2018   Procedure: LEFT REVERSE SHOULDER ARTHROPLASTY;  Surgeon: Meredith Pel, MD;  Location: [facility];  Service: Orthopedics;  Laterality: Left;  . SHOULDER ARTHROSCOPY WITH OPEN ROTATOR CUFF REPAIR AND DISTAL CLAVICLE ACROMINECTOMY Right 11/26/2013   Procedure: RIGHT SHOULDER ARTHROSCOPY WITH MINI OPEN ROTATOR CUFF REPAIR AND DISTAL CLAVICLE RESECTION, SUBACROMIAL DECOMPRESSION, POSSIBLE [facility] PATCH.;  Surgeon: [name], MD;  Location: [place];  Service: Orthopedics;  Laterality: Right;  . TOTAL KNEE ARTHROPLASTY Right 03/21/2017  . TOTAL KNEE ARTHROPLASTY Right 03/21/2017   Procedure: RIGHT TOTAL KNEE ARTHROPLASTY;  Surgeon: [name], MD;  Location: [place];  Service: Orthopedics;  Laterality: Right;  . TUBAL LIGATION      There were no vitals  filed for this visit.  Subjective Assessment - 01/22/19 1114    Subjective  Patient reports she is having minimal at rest, with 5/6 pain with any reaching, reports her arm just feels very sore.    Pertinent History  Pt is a 68 year old female s/p L reverse total shoulder 12/11/18. Reports she was immobilized in her sling for 2 weeks. After immobilization has been using passive motion machine for flexion. Has been doing AAROM flexion/abd/ER, bicep, shrugs, and forward punches with 1# with HHPT over the past week. MD Marlou Sa advised patient for out of sling, AAROM, PROM with no lifting of more than 5# until next followup with him (mid Oct). Pt reports only 2/10 at rest, 6/10 with motion. Pain is at the medial  upper arm and in the armpit. Pain is worse during the evening after moving it all day. Patient lives with her husband and her 4 dogs, which she walks frequently and cares for. She is R handed.Pt denies N/V, B&B changes, unexplained weight fluctuation, saddle paresthesia, fever, night sweats, or unrelenting night pain at this time.Pt denies N/V, B&B changes, unexplained weight fluctuation, saddle paresthesia, fever, night sweats, or unrelenting night pain at this time.    Limitations  House hold activities;Lifting    How long can you sit comfortably?  unlimited    How long can you stand comfortably?  unlimited    How long can you walk comfortably?  unlimited    Diagnostic tests  Xrays    Patient Stated Goals  Being able to complete daily tasks           Ther-Ex - Supine Left shoulder flexion 0-90 degrees, long lever 2x10  - Supine Left shoulder abd 0-90 degrees, long lever 2x10 -Left ER in supine, hand from belly to 0 degrees2x 10 w/ 1lb weight  - Left skull crusher 2x 10 with min cuing for set up with good carry over - Bicep curl 3# x10 with increased soreness, difficulty with eccentric control with final reps; 2# x10 with better form - Scapular retraction + chin tuck/cervical retraction in in sitting x15 3 sec holds with good carry over following demo  -Pulleys flex, abdct x15each direction with 3sec holds in minimal pain   Manual STM with trigger point release to pec major/minor, L deltoid group, and L UT, gentle PROM to 90d in flex/abd and 45d in ER/IR with some discomfort initially, subsides quickly                       PT Education - 01/22/19 1123    Education Details  exercise form    Person(s) Educated  Patient    Methods  Explanation;Demonstration;Tactile cues;Verbal cues    Comprehension  Verbalized understanding;Returned demonstration;Verbal cues required;Tactile cues required       PT Short Term Goals - 01/02/19 1121      PT SHORT TERM GOAL #1    Title  Pt will be independent with HEP in order to improve strength and balance in order to improve function at home    Baseline  01/02/19    Time  4    Period  Weeks    Status  New        PT Long Term Goals - 01/02/19 1115      PT LONG TERM GOAL #1   Title  Pt will decrease quick DASH score by at least 8% in order to demonstrate clinically significant reduction in disability.  Baseline  01/02/19 79.5%    Time  8    Period  Weeks    Status  New      PT LONG TERM GOAL #2   Title  Patient will demonstrate full AROM in L shoulder in order to complete household ADLs    Baseline  01/02/19 see eval    Time  8    Period  Weeks    Status  New      PT LONG TERM GOAL #3   Title  Pt will decrease worst pain as reported on NPRS by at least 3 points in order to demonstrate clinically significant reduction in pain.    Baseline  01/02/19 6/10 pain with movement    Time  8    Period  Weeks    Status  New      PT LONG TERM GOAL #4   Title  Pt will demonstrate gross bilat shoulder and periscapular strength of 4/10 in order to safely lift for heavy household chores    Baseline  01/02/19 see eval    Time  8    Period  Weeks    Status  New            Plan - 01/22/19 1158    Clinical Impression Statement  Continued to address ROM and strength deficits within post op protocol. Pt with increased soreness this session, with faster fatigue. PT advised patient to forgo strengthening exercises at home today and tomorrow until next PT visit (thurs), as patinet reports she is working at home with her 1# dumbbell multiple times a day. Pt verbalizes understanding, PT will continue progression as able.    Personal Factors and Comorbidities  Age;Sex;Behavior Pattern;Comorbidity 1;Fitness    Comorbidities  HTN    Examination-Activity Limitations  Dressing;Carry;Reach Overhead;Lift    Stability/Clinical Decision Making  Evolving/Moderate complexity    Clinical Decision Making  Moderate    Rehab  Potential  Good    PT Frequency  2x / week    PT Duration  8 weeks    PT Treatment/Interventions  ADLs/Self Care Home Management;Electrical Stimulation;Therapeutic activities;Passive range of motion;Manual techniques;Patient/family education;Therapeutic exercise;Iontophoresis 4m/ml Dexamethasone;Moist Heat;Traction;Cryotherapy;Ultrasound;Functional mobility training;Neuromuscular re-education;Balance training;Dry needling;Joint Manipulations    PT Next Visit Plan  Continue to gently progress mobility and strength    PT Home Exercise Plan  AAROM cane, pulleys, grip strength, UT stretch    Consulted and Agree with Plan of Care  Patient       Patient will benefit from skilled therapeutic intervention in order to improve the following deficits and impairments:  Decreased mobility, Decreased endurance, Pain, Postural dysfunction, Impaired UE functional use, Impaired flexibility, Increased fascial restricitons, Decreased strength, Decreased coordination, Decreased range of motion, Improper body mechanics  Visit Diagnosis: Acute pain of left shoulder  Stiffness of left shoulder, not elsewhere classified  Muscle weakness (generalized)     Problem List Patient Active Problem List   Diagnosis Date Noted  . 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 Chronic left shoulder pain 08/23/2018  . Chronic diarrhea 12/07/2014  . Seg[place] 09/12/[place]  . Routine general medical examination at a health care facility 06/05/2014  . Overweight 06/13/2011  . ABNORMAL CHEST XRAY 09/30/2007  . Hyperlipidemia 02/17/2007  . Essential hypertension 01/08/2007  . Osteoarthritis 01/08/2007   CShelton SilvasPT, DPT CShelton Silvas10/13/2020, 12:00 PM  [facility] [address] [place] [address]Phone: [phone]  Fax:  [phone]  Name: [name] MRN: [id number] Date of Birth:  1950-04-24

## 2019-01-23 ENCOUNTER — Ambulatory Visit (INDEPENDENT_AMBULATORY_CARE_PROVIDER_SITE_OTHER): Payer: Medicare Other | Admitting: Orthopedic Surgery

## 2019-01-23 ENCOUNTER — Encounter: Payer: Self-pay | Admitting: Orthopedic Surgery

## 2019-01-23 ENCOUNTER — Encounter: Payer: Medicare Other | Admitting: Physical Therapy

## 2019-01-23 VITALS — Ht 62.0 in | Wt 187.0 lb

## 2019-01-23 DIAGNOSIS — Z96612 Presence of left artificial shoulder joint: Secondary | ICD-10-CM

## 2019-01-23 MED ORDER — AMOXICILLIN 500 MG PO TABS: ORAL_TABLET | ORAL | 0 refills | Status: DC

## 2019-01-23 NOTE — Progress Notes (Signed)
Post-Op Visit Note   Patient: [name]           Date of Birth: 1950-06-16           MRN: [id number] Visit Date: 01/23/2019 PCP: Stacy Koch, MD   Assessment & Plan:  Chief Complaint:  Chief Complaint  Patient presents with  . Left Shoulder - Follow-up    12/11/2018 Left RSA   Visit Diagnoses:  1. Status post reverse total replacement of left shoulder     Plan: [name] is a patient is now 6 weeks out left reverse shoulder replacement.  She is in therapy 2 times a week.  On exam she has forward flexion abduction both above 90 degrees.  Strength is improving.  Incision is intact.  Continue with therapy 2 times a week increasing range of motion and strengthening.  Come back in 6 weeks for final check.  I do want to send in a prescription for amoxicillin 2 g p.o. 1 hour before procedure do not want her doing any elective dental work for 3 months after joint replacement.  Follow-Up Instructions: Return in about 6 weeks (around 03/06/2019).   Orders:  No orders of the defined types were placed in this encounter.  No orders of the defined types were placed in this encounter.   Imaging: No results found.  PMFS History: Patient Active Problem List   Diagnosis Date Noted  . 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 Chronic left shoulder pain 08/23/2018  . Chronic diarrhea 12/07/2014  . Segmental colitis with rectal bleeding ([place]) 09/12/2014  . Routine general medical examination at a health care facility 06/05/2014  . Overweight 06/13/2011  . ABNORMAL CHEST XRAY 09/30/2007  . Hyperlipidemia 02/17/2007  . Essential hypertension 01/08/2007  . Osteoarthritis 01/08/2007   Past Medical History:  Diagnosis Date  . Arthritis    back, fingers with joint pain and swelling.  chronic back pain  . Chronic back pain    arthritis   . Diverticulosis   . Elevated cholesterol    takes Niacin daily and Simvastatin  . GERD (gastroesophageal reflux disease) 06/2004   non-specific gastritis on EGD 06/2004  . Headache(784.0)    occasionally  . History of colon polyps 2004, 2009   2004:adenomatous. 2009 hyperplastic.   Marland Kitchen History of hiatal hernia   . Hypertension    takes Tenoretic and Lisinopril daily  . Mucoid cyst of joint 08/2013   right index finger  . Osteopenia 01/2014   T score -1.1 FRAX 14%/0.5%. Stable from prior DEXA  . PONV (postoperative nausea and vomiting)   . Rotator cuff arthropathy    Left  . Seasonal allergies   . Stroke ([place]) 1998   x 2 - mild left-sided weakness  . Urge incontinence   . Uterine prolapse     Family History  Problem Relation Age of Onset  . Hypertension Mother   . Heart disease Mother   . Diabetes Father   . Hypertension Father   . Heart disease Father   . Stroke Father   . Diabetes Maternal Aunt   . Diabetes Paternal Grandmother   . Hypertension Paternal Grandfather   . Stroke Paternal Grandfather     Past Surgical History:  Procedure Laterality Date  . BACK SURGERY     x 2  . BELPHAROPTOSIS REPAIR Bilateral 12/14/2017  . CATARACT EXTRACTION    . CATARACT EXTRACTION Bilateral 10/2017  . COLONOSCOPY  2004, 2009, 2014  . FINGER SURGERY    .  GUM SURGERY    . GYNECOLOGIC CRYOSURGERY    . HYSTEROSCOPY W/D&C  12/07/2010   with resection of endometrial polyp  . JOINT REPLACEMENT    . KNEE ARTHROSCOPY Left 2008  . lip biopsy     done at MD office Fri 11/22/13  . MASS EXCISION Right 08/15/2013   Procedure: RIGHT INDEX EXCISION MASS ;  Surgeon: Tennis Must, MD;  Location: [facility];  Service: Orthopedics;  Laterality: Right;  . NASAL SEPTUM SURGERY    . OOPHORECTOMY Right 2000  . REVERSE SHOULDER ARTHROPLASTY Left 12/11/2018  . REVERSE SHOULDER ARTHROPLASTY Left 12/11/2018   Procedure: LEFT REVERSE SHOULDER ARTHROPLASTY;  Surgeon: Stacy Pel, MD;  Location: [place];  Service: Orthopedics;  Laterality: Left;  . SHOULDER ARTHROSCOPY WITH OPEN ROTATOR CUFF REPAIR AND DISTAL CLAVICLE  ACROMINECTOMY Right 11/26/2013   Procedure: RIGHT SHOULDER ARTHROSCOPY WITH MINI OPEN ROTATOR CUFF REPAIR AND DISTAL CLAVICLE RESECTION, SUBACROMIAL DECOMPRESSION, POSSIBLE [facility] PATCH.;  Surgeon: [name], MD;  Location: [place];  Service: Orthopedics;  Laterality: Right;  . TOTAL KNEE ARTHROPLASTY Right 03/21/2017  . TOTAL KNEE ARTHROPLASTY Right 03/21/2017   Procedure: RIGHT TOTAL KNEE ARTHROPLASTY;  Surgeon: [name], MD;  Location: [place];  Service: Orthopedics;  Laterality: Right;  . TUBAL LIGATION     Social History   Occupational History  . Occupation: Animal nutritionist    Comment: Disability/Retired  Tobacco Use  . Smoking status: Passive Smoke Exposure - Never Smoker  . Smokeless tobacco: Never Used  Substance and Sexual Activity  . Alcohol use: Not Currently    Alcohol/week: 0.0 standard drinks    Frequency: Never    Comment: about 2-3 times a year  . Drug use: No  . Sexual activity: Yes    Partners: Male    Birth control/protection: Surgical, Post-menopausal    Comment: BTL-1st intercourse 68 yo-More than 5 partners

## 2019-01-23 NOTE — Addendum Note (Signed)
Addended by: Meyer Cory on: 01/23/2019 03:23 PM   Modules accepted: Orders

## 2019-01-24 ENCOUNTER — Encounter: Payer: Self-pay | Admitting: Physical Therapy

## 2019-01-24 ENCOUNTER — Other Ambulatory Visit: Payer: Self-pay

## 2019-01-24 ENCOUNTER — Ambulatory Visit: Payer: Medicare Other | Admitting: Physical Therapy

## 2019-01-24 DIAGNOSIS — M25612 Stiffness of left shoulder, not elsewhere classified: Secondary | ICD-10-CM | POA: Diagnosis not present

## 2019-01-24 DIAGNOSIS — M25512 Pain in left shoulder: Secondary | ICD-10-CM

## 2019-01-24 DIAGNOSIS — M6281 Muscle weakness (generalized): Secondary | ICD-10-CM

## 2019-01-24 NOTE — Therapy (Signed)
[facility] [address], [place], [address] Phone: [phone]   Fax:  [phone]  Physical Therapy Treatment  Patient Details  Name: [name] MRN: [id number] Date of Birth: January 27, 1951 Referring Provider (PT): Lenon Oms MD   Encounter Date: 01/24/2019  PT End of Session - 01/24/19 1542    Visit Number  8    Number of Visits  17    Date for PT Re-Evaluation  02/27/19    PT Start Time  0315    PT Stop Time  0400    PT Time Calculation (min)  45 min    Activity Tolerance  Patient tolerated treatment well;No increased pain    Behavior During Therapy  WFL for tasks assessed/performed       Past Medical History:  Diagnosis Date  . Arthritis    back, fingers with joint pain and swelling.  chronic back pain  . Chronic back pain    arthritis   . Diverticulosis   . Elevated cholesterol    takes Niacin daily and Simvastatin  . GERD (gastroesophageal reflux disease) 06/2004   non-specific gastritis on EGD 06/2004  . Headache(784.0)    occasionally  . History of colon polyps 2004, 2009   2004:adenomatous. 2009 hyperplastic.   Marland Kitchen History of hiatal hernia   . Hypertension    takes Tenoretic and Lisinopril daily  . Mucoid cyst of joint 08/2013   right index finger  . Osteopenia 01/2014   T score -1.1 FRAX 14%/0.5%. Stable from prior DEXA  . PONV (postoperative nausea and vomiting)   . Rotator cuff arthropathy    Left  . Seasonal allergies   . Stroke ([place]) 1998   x 2 - mild left-sided weakness  . Urge incontinence   . Uterine prolapse     Past Surgical History:  Procedure Laterality Date  . BACK SURGERY     x 2  . BELPHAROPTOSIS REPAIR Bilateral 12/14/2017  . CATARACT EXTRACTION    . CATARACT EXTRACTION Bilateral 10/2017  . COLONOSCOPY  2004, 2009, 2014  . FINGER SURGERY    . GUM SURGERY    . GYNECOLOGIC CRYOSURGERY    . HYSTEROSCOPY W/D&C  12/07/2010   with resection of endometrial  polyp  . JOINT REPLACEMENT    . KNEE ARTHROSCOPY Left 2008  . lip biopsy     done at MD office Fri 11/22/13  . MASS EXCISION Right 08/15/2013   Procedure: RIGHT INDEX EXCISION MASS ;  Surgeon: Tennis Must, MD;  Location: [facility];  Service: Orthopedics;  Laterality: Right;  . NASAL SEPTUM SURGERY    . OOPHORECTOMY Right 2000  . REVERSE SHOULDER ARTHROPLASTY Left 12/11/2018  . REVERSE SHOULDER ARTHROPLASTY Left 12/11/2018   Procedure: LEFT REVERSE SHOULDER ARTHROPLASTY;  Surgeon: Meredith Pel, MD;  Location: [facility];  Service: Orthopedics;  Laterality: Left;  . SHOULDER ARTHROSCOPY WITH OPEN ROTATOR CUFF REPAIR AND DISTAL CLAVICLE ACROMINECTOMY Right 11/26/2013   Procedure: RIGHT SHOULDER ARTHROSCOPY WITH MINI OPEN ROTATOR CUFF REPAIR AND DISTAL CLAVICLE RESECTION, SUBACROMIAL DECOMPRESSION, POSSIBLE [facility] PATCH.;  Surgeon: [name], MD;  Location: [facility];  Service: Orthopedics;  Laterality: Right;  . TOTAL KNEE ARTHROPLASTY Right 03/21/2017  . TOTAL KNEE ARTHROPLASTY Right 03/21/2017   Procedure: RIGHT TOTAL KNEE ARTHROPLASTY;  Surgeon: [name], MD;  Location: [facility];  Service: Orthopedics;  Laterality: Right;  . TUBAL LIGATION      There were no vitals  filed for this visit.  Subjective Assessment - 01/24/19 1519    Subjective  Reports she went to her surgeon yesterday and was told that she can move to where she can and to not lift over 15lbs. Reports she feels sore today, but not as much pain as last time. HEP is going well, did better not doing the weights yesterday.    Pertinent History  Pt is a 68 year old female s/p L reverse total shoulder 12/11/18. Reports she was immobilized in her sling for 2 weeks. After immobilization has been using passive motion machine for flexion. Has been doing AAROM flexion/abd/ER, bicep, shrugs, and forward punches with 1# with HHPT over the past week. MD Marlou Sa advised patient for out of sling, AAROM, PROM with no lifting  of more than 5# until next followup with him (mid Oct). Pt reports only 2/10 at rest, 6/10 with motion. Pain is at the medial upper arm and in the armpit. Pain is worse during the evening after moving it all day. Patient lives with her husband and her 4 dogs, which she walks frequently and cares for. She is R handed.Pt denies N/V, B&B changes, unexplained weight fluctuation, saddle paresthesia, fever, night sweats, or unrelenting night pain at this time.Pt denies N/V, B&B changes, unexplained weight fluctuation, saddle paresthesia, fever, night sweats, or unrelenting night pain at this time.    Limitations  House hold activities;Lifting    How long can you sit comfortably?  unlimited    How long can you stand comfortably?  unlimited    How long can you walk comfortably?  unlimited    Diagnostic tests  Xrays    Patient Stated Goals  Being able to complete daily tasks          Ther-Ex - Supine AAROM wand flexion 0-130 degrees, long lever 2x10 - Supine Left shoulder abd 0-90 degrees, long lever 2x10 - Left ER in sidelying 2x10 with TC for set up with good carry over following - Standing shoulder flex 1# DB 2x 10 with cuing for scapular retraction and to prevent shoulder hiking with good carry over - Standing shoulder abd 1# DB (0-45d) 2x 10 min cuing for eccentric control with good carry over - Standing row GTB 2x 10 with demo and TC for proper form/posture set up with good carry over following   Manual STM with trigger point release to pec major/minor, L deltoid group, and L UT, PROM in all directions with sustained holds                        PT Education - 01/24/19 1541    Education Details  Exercise form    Person(s) Educated  Patient    Methods  Explanation;Demonstration;Verbal cues    Comprehension  Verbalized understanding;Returned demonstration;Verbal cues required       PT Short Term Goals - 01/02/19 1121      PT SHORT TERM GOAL #1   Title  Pt will be  independent with HEP in order to improve strength and balance in order to improve function at home    Baseline  01/02/19    Time  4    Period  Weeks    Status  New        PT Long Term Goals - 01/02/19 1115      PT LONG TERM GOAL #1   Title  Pt will decrease quick DASH score by at least 8% in order to demonstrate clinically  significant reduction in disability.    Baseline  01/02/19 79.5%    Time  8    Period  Weeks    Status  New      PT LONG TERM GOAL #2   Title  Patient will demonstrate full AROM in L shoulder in order to complete household ADLs    Baseline  01/02/19 see eval    Time  8    Period  Weeks    Status  New      PT LONG TERM GOAL #3   Title  Pt will decrease worst pain as reported on NPRS by at least 3 points in order to demonstrate clinically significant reduction in pain.    Baseline  01/02/19 6/10 pain with movement    Time  8    Period  Weeks    Status  New      PT LONG TERM GOAL #4   Title  Pt will demonstrate gross bilat shoulder and periscapular strength of 4/10 in order to safely lift for heavy household chores    Baseline  01/02/19 see eval    Time  8    Period  Weeks    Status  New            Plan - 01/24/19 1551    Clinical Impression Statement  Continued ROM and strengthening within pain limitations with good success. Patient is improving in ROM and is able to complete all therex with proper form following some demo and cuing from PT. PT will continue progression as able.    Personal Factors and Comorbidities  Age;Sex;Behavior Pattern;Comorbidity 1;Fitness    Comorbidities  HTN    Examination-Activity Limitations  Dressing;Carry;Reach Overhead;Lift    Examination-Participation Restrictions  Driving;Community Activity;Cleaning    Stability/Clinical Decision Making  Evolving/Moderate complexity    Clinical Decision Making  Moderate    Rehab Potential  Good    PT Frequency  2x / week    PT Duration  8 weeks    PT Treatment/Interventions   ADLs/Self Care Home Management;Electrical Stimulation;Therapeutic activities;Passive range of motion;Manual techniques;Patient/family education;Therapeutic exercise;Iontophoresis 42m/ml Dexamethasone;Moist Heat;Traction;Cryotherapy;Ultrasound;Functional mobility training;Neuromuscular re-education;Balance training;Dry needling;Joint Manipulations    PT Next Visit Plan  Continue to gently progress mobility and strength    PT Home Exercise Plan  AAROM cane, pulleys, grip strength, UT stretch    Consulted and Agree with Plan of Care  Patient       Patient will benefit from skilled therapeutic intervention in order to improve the following deficits and impairments:  Decreased mobility, Decreased endurance, Pain, Postural dysfunction, Impaired UE functional use, Impaired flexibility, Increased fascial restricitons, Decreased strength, Decreased coordination, Decreased range of motion, Improper body mechanics  Visit Diagnosis: Acute pain of left shoulder  Stiffness of left shoulder, not elsewhere classified  Muscle weakness (generalized)     Problem List Patient Active Problem List   Diagnosis Date Noted  . 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 Chronic left shoulder pain 08/23/2018  . Chronic diarrhea 12/07/2014  . Segmental [place] 09/12/2014  . Routine general medical examination at a health care facility 06/05/2014  . Overweight 06/13/2011  . ABNORMAL CHEST XRAY 09/30/2007  . Hyperlipidemia 02/17/2007  . Essential hypertension 01/08/2007  . Osteoarthritis 01/08/2007   CShelton SilvasPT, DPT CShelton Silvas10/15/2020, 3:56 PM  [facility] [address] [place] [address]Phone: [phone]  Fax:  [phone] Name: [name]  [place] MRN: [id number] Date of Birth: January 14, 1951

## 2019-01-28 ENCOUNTER — Ambulatory Visit: Payer: Medicare Other | Admitting: Physical Therapy

## 2019-01-28 ENCOUNTER — Other Ambulatory Visit: Payer: Self-pay

## 2019-01-28 ENCOUNTER — Encounter: Payer: Self-pay | Admitting: Physical Therapy

## 2019-01-28 DIAGNOSIS — M25612 Stiffness of left shoulder, not elsewhere classified: Secondary | ICD-10-CM

## 2019-01-28 DIAGNOSIS — M25512 Pain in left shoulder: Secondary | ICD-10-CM | POA: Diagnosis not present

## 2019-01-28 DIAGNOSIS — M6281 Muscle weakness (generalized): Secondary | ICD-10-CM | POA: Diagnosis not present

## 2019-01-28 NOTE — Therapy (Signed)
[facility] [address] [place], [address] Phone: [phone]   Fax:  [phone]  Physical Therapy Treatment  Patient Details  Name: [name] MRN: [id number] Date of Birth: [birth year] Referring Provider (PT): Lenon Oms MD   Encounter Date: 01/28/2019  PT End of Session - 01/28/19 1510    Visit Number  9    Number of Visits  17    Date for PT Re-Evaluation  02/27/19    PT Start Time  0230    PT Stop Time  0315    PT Time Calculation (min)  45 min    Activity Tolerance  Patient tolerated treatment well;No increased pain    Behavior During Therapy  WFL for tasks assessed/performed       Past Medical History:  Diagnosis Date  . Arthritis    back, fingers with joint pain and swelling.  chronic back pain  . Chronic back pain    arthritis   . Diverticulosis   . Elevated cholesterol    takes Niacin daily and Simvastatin  . GERD (gastroesophageal reflux disease) 06/2004   non-specific gastritis on EGD 06/2004  . Headache(784.0)    occasionally  . History of colon polyps 2004, 2009   2004:adenomatous. 2009 hyperplastic.   Marland Kitchen History of hiatal hernia   . Hypertension    takes Tenoretic and Lisinopril daily  . Mucoid cyst of joint 08/2013   right index finger  . Osteopenia 01/2014   T score -1.1 FRAX 14%/0.5%. Stable from prior DEXA  . PONV (postoperative nausea and vomiting)   . Rotator cuff arthropathy    Left  . Seasonal allergies   . Stroke ([place]) 1998   x 2 - mild left-sided weakness  . Urge incontinence   . Uterine prolapse     Past Surgical History:  Procedure Laterality Date  . BACK SURGERY     x 2  . BELPHAROPTOSIS REPAIR Bilateral 12/14/2017  . CATARACT EXTRACTION    . CATARACT EXTRACTION Bilateral 10/2017  . COLONOSCOPY  2004, 2009, 2014  . FINGER SURGERY    . GUM SURGERY    . GYNECOLOGIC CRYOSURGERY    . HYSTEROSCOPY W/D&C  12/07/2010   with resection of endometrial  polyp  . JOINT REPLACEMENT    . KNEE ARTHROSCOPY Left 2008  . lip biopsy     done at MD office Fri 11/22/13  . MASS EXCISION Right 08/15/2013   Procedure: RIGHT INDEX EXCISION MASS ;  Surgeon: Tennis Must, MD;  Location: [facility];  Service: Orthopedics;  Laterality: Right;  . NASAL SEPTUM SURGERY    . OOPHORECTOMY Right 2000  . REVERSE SHOULDER ARTHROPLASTY Left 12/11/2018  . REVERSE SHOULDER ARTHROPLASTY Left 12/11/2018   Procedure: LEFT REVERSE SHOULDER ARTHROPLASTY;  Surgeon: Meredith Pel, MD;  Location: [facility];  Service: Orthopedics;  Laterality: Left;  . SHOULDER ARTHROSCOPY WITH OPEN ROTATOR CUFF REPAIR AND DISTAL CLAVICLE ACROMINECTOMY Right 11/26/2013   Procedure: RIGHT SHOULDER ARTHROSCOPY WITH MINI OPEN ROTATOR CUFF REPAIR AND DISTAL CLAVICLE RESECTION, SUBACROMIAL DECOMPRESSION, POSSIBLE [facility] PATCH.;  Surgeon: [name], MD;  Location: [facility];  Service: Orthopedics;  Laterality: Right;  . TOTAL KNEE ARTHROPLASTY Right 03/21/2017  . TOTAL KNEE ARTHROPLASTY Right 03/21/2017   Procedure: RIGHT TOTAL KNEE ARTHROPLASTY;  Surgeon: [name], MD;  Location: [facility];  Service: Orthopedics;  Laterality: Right;  . TUBAL LIGATION      There were no vitals  filed for this visit.  Subjective Assessment - 01/28/19 1431    Subjective  Had a good weekend with her granddaughters. Reports 4/10 pain with overhead motion. Reports she is still cautious with far reaching and overhead motion. Thinks motion is coming along    Pertinent History  Pt is a 68 year old female s/p L reverse total shoulder 12/11/18. Reports she was immobilized in her sling for 2 weeks. After immobilization has been using passive motion machine for flexion. Has been doing AAROM flexion/abd/ER, bicep, shrugs, and forward punches with 1# with HHPT over the past week. MD Marlou Sa advised patient for out of sling, AAROM, PROM with no lifting of more than 5# until next followup with him (mid Oct). Pt  reports only 2/10 at rest, 6/10 with motion. Pain is at the medial upper arm and in the armpit. Pain is worse during the evening after moving it all day. Patient lives with her husband and her 4 dogs, which she walks frequently and cares for. She is R handed.Pt denies N/V, B&B changes, unexplained weight fluctuation, saddle paresthesia, fever, night sweats, or unrelenting night pain at this time.Pt denies N/V, B&B changes, unexplained weight fluctuation, saddle paresthesia, fever, night sweats, or unrelenting night pain at this time.    Limitations  House hold activities;Lifting    How long can you sit comfortably?  unlimited    How long can you stand comfortably?  unlimited    How long can you walk comfortably?  unlimited    Diagnostic tests  Xrays    Patient Stated Goals  Being able to complete daily tasks    Pain Onset  1 to 4 weeks ago      Ther-Ex - Supine AAROM yellow theraball 0-145 degrees, long lever 2x10 -Supine Left shoulderabd0-90 degrees, long lever 2x10 - Left ER in sidelying x10; 1# 2x 10 with min cuing for pure rotation without elbow ext - Standing shoulder flex 0-90d 1# DB 2x 10 with cuing o prevent shoulder hiking and trunk lean compensation; good carry over Noticed difficulty with terminal elbow ext - Standing eccentric bicep curl 3# x10; 2# x10 with full elbow ext noted, pt reports she can "feel the stretch"   Manual STM withtrigger point releaseto pec major (along clavicle)/minor along insertion, L deltoid group, and L UT, PROM in all directions with sustained holds                          PT Education - 01/28/19 1507    Education Details  exercise form; posture    Person(s) Educated  Patient    Methods  Explanation;Demonstration;Verbal cues    Comprehension  Verbalized understanding;Returned demonstration;Verbal cues required       PT Short Term Goals - 01/02/19 1121      PT SHORT TERM GOAL #1   Title  Pt will be independent with  HEP in order to improve strength and balance in order to improve function at home    Baseline  01/02/19    Time  4    Period  Weeks    Status  New        PT Long Term Goals - 01/02/19 1115      PT LONG TERM GOAL #1   Title  Pt will decrease quick DASH score by at least 8% in order to demonstrate clinically significant reduction in disability.    Baseline  01/02/19 79.5%    Time  8  Period  Weeks    Status  New      PT LONG TERM GOAL #2   Title  Patient will demonstrate full AROM in L shoulder in order to complete household ADLs    Baseline  01/02/19 see eval    Time  8    Period  Weeks    Status  New      PT LONG TERM GOAL #3   Title  Pt will decrease worst pain as reported on NPRS by at least 3 points in order to demonstrate clinically significant reduction in pain.    Baseline  01/02/19 6/10 pain with movement    Time  8    Period  Weeks    Status  New      PT LONG TERM GOAL #4   Title  Pt will demonstrate gross bilat shoulder and periscapular strength of 4/10 in order to safely lift for heavy household chores    Baseline  01/02/19 see eval    Time  8    Period  Weeks    Status  New            Plan - 01/28/19 1524    Clinical Impression Statement  Continued ROM and strengthening progression with good success, increased motion all directions and ability to increase therex intensity with min cuing needed for proper form/technique. PT advised patient to attempt heat application to decrease muscle tension, verbalized understanding. PT will progress as able.    Personal Factors and Comorbidities  Age;Sex;Behavior Pattern;Comorbidity 1;Fitness    Comorbidities  HTN    Examination-Activity Limitations  Dressing;Carry;Reach Overhead;Lift    Examination-Participation Restrictions  Driving;Community Activity;Cleaning    Stability/Clinical Decision Making  Evolving/Moderate complexity    Clinical Decision Making  Moderate    Rehab Potential  Good    PT Frequency  2x / week     PT Duration  8 weeks    PT Treatment/Interventions  ADLs/Self Care Home Management;Electrical Stimulation;Therapeutic activities;Passive range of motion;Manual techniques;Patient/family education;Therapeutic exercise;Iontophoresis 52m/ml Dexamethasone;Moist Heat;Traction;Cryotherapy;Ultrasound;Functional mobility training;Neuromuscular re-education;Balance training;Dry needling;Joint Manipulations    PT Next Visit Plan  Continue to gently progress mobility and strength    PT Home Exercise Plan  AAROM cane, pulleys, grip strength, UT stretch    Consulted and Agree with Plan of Care  Patient       Patient will benefit from skilled therapeutic intervention in order to improve the following deficits and impairments:  Decreased mobility, Decreased endurance, Pain, Postural dysfunction, Impaired UE functional use, Impaired flexibility, Increased fascial restricitons, Decreased strength, Decreased coordination, Decreased range of motion, Improper body mechanics  Visit Diagnosis: Acute pain of left shoulder  Stiffness of left shoulder, not elsewhere classified  Muscle weakness (generalized)     Problem List Patient Active Problem List   Diagnosis Date Noted  . 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 Chronic left shoulder pain 08/23/2018  . Chronic diarrhea 12/07/2014  . Segmental [place] with rectal bleeding ([place] 09/12/2014  . Routine general medical examination at a health care facility 06/05/2014  . Overweight 06/13/2011  . ABNORMAL CHEST XRAY 09/30/2007  . Hyperlipidemia 02/17/2007  . Essential hypertension 01/08/2007  . Osteoarthritis 01/08/2007   CShelton SilvasPT, DPT CShelton Silvas10/19/2020, 3:28 PM  [facility] [address] [place] [address]Phone: [phone]  Fax:  [phone] Name: Stacy STOLLMRN: [id number] of Birth: 3Oct 04, 1952

## 2019-01-30 ENCOUNTER — Other Ambulatory Visit: Payer: Self-pay

## 2019-01-30 ENCOUNTER — Ambulatory Visit: Payer: Medicare Other | Admitting: Physical Therapy

## 2019-01-30 ENCOUNTER — Encounter: Payer: Self-pay | Admitting: Physical Therapy

## 2019-01-30 DIAGNOSIS — M25512 Pain in left shoulder: Secondary | ICD-10-CM | POA: Diagnosis not present

## 2019-01-30 DIAGNOSIS — M6281 Muscle weakness (generalized): Secondary | ICD-10-CM

## 2019-01-30 DIAGNOSIS — M25612 Stiffness of left shoulder, not elsewhere classified: Secondary | ICD-10-CM | POA: Diagnosis not present

## 2019-01-30 NOTE — Therapy (Addendum)
[facility] [address], [place], [address] Phone: [phone]   Fax:  [phone]  Physical Therapy Treatment/Progress Note Reporting 01/02/19 - 01/30/19  Patient Details  Name: [name] MRN: [id number] Date of Birth: 06/08/1950 Referring Provider (PT): Lenon Oms MD   Encounter Date: 01/30/2019  PT End of Session - 01/30/19 1407    Visit Number  10    Number of Visits  17    Date for PT Re-Evaluation  02/27/19    PT Start Time  0145    PT Stop Time  0230    PT Time Calculation (min)  45 min    Activity Tolerance  Patient tolerated treatment well;No increased pain    Behavior During Therapy  WFL for tasks assessed/performed       Past Medical History:  Diagnosis Date  . Arthritis    back, fingers with joint pain and swelling.  chronic back pain  . Chronic back pain    arthritis   . Diverticulosis   . Elevated cholesterol    takes Niacin daily and Simvastatin  . GERD (gastroesophageal reflux disease) 06/2004   non-specific gastritis on EGD 06/2004  . Headache(784.0)    occasionally  . History of colon polyps 2004, 2009   2004:adenomatous. 2009 hyperplastic.   Marland Kitchen History of hiatal hernia   . Hypertension    takes Tenoretic and Lisinopril daily  . Mucoid cyst of joint 08/2013   right index finger  . Osteopenia 01/2014   T score -1.1 FRAX 14%/0.5%. Stable from prior DEXA  . PONV (postoperative nausea and vomiting)   . Rotator cuff arthropathy    Left  . Seasonal allergies   . Stroke ([place]) 1998   x 2 - mild left-sided weakness  . Urge incontinence   . Uterine prolapse     Past Surgical History:  Procedure Laterality Date  . BACK SURGERY     x 2  . BELPHAROPTOSIS REPAIR Bilateral 12/14/2017  . CATARACT EXTRACTION    . CATARACT EXTRACTION Bilateral 10/2017  . COLONOSCOPY  2004, 2009, 2014  . FINGER SURGERY    . GUM SURGERY    . GYNECOLOGIC CRYOSURGERY    . HYSTEROSCOPY W/D&C   12/07/2010   with resection of endometrial polyp  . JOINT REPLACEMENT    . KNEE ARTHROSCOPY Left 2008  . lip biopsy     done at MD office Fri 11/22/13  . MASS EXCISION Right 08/15/2013   Procedure: RIGHT INDEX EXCISION MASS ;  Surgeon: Tennis Must, MD;  Location: [facility];  Service: Orthopedics;  Laterality: Right;  . NASAL SEPTUM SURGERY    . OOPHORECTOMY Right 2000  . REVERSE SHOULDER ARTHROPLASTY Left 12/11/2018  . REVERSE SHOULDER ARTHROPLASTY Left 12/11/2018   Procedure: LEFT REVERSE SHOULDER ARTHROPLASTY;  Surgeon: Meredith Pel, MD;  Location: [facility];  Service: Orthopedics;  Laterality: Left;  . SHOULDER ARTHROSCOPY WITH OPEN ROTATOR CUFF REPAIR AND DISTAL CLAVICLE ACROMINECTOMY Right 11/26/2013   Procedure: RIGHT SHOULDER ARTHROSCOPY WITH MINI OPEN ROTATOR CUFF REPAIR AND DISTAL CLAVICLE RESECTION, SUBACROMIAL DECOMPRESSION, POSSIBLE [facility] PATCH.;  Surgeon: [name], MD;  Location: [facility];  Service: Orthopedics;  Laterality: Right;  . TOTAL KNEE ARTHROPLASTY Right 03/21/2017  . TOTAL KNEE ARTHROPLASTY Right 03/21/2017   Procedure: RIGHT TOTAL KNEE ARTHROPLASTY;  Surgeon: [name], MD;  Location: [place];  Service: Orthopedics;  Laterality: Right;  . TUBAL LIGATION  There were no vitals filed for this visit.  Subjective Assessment - 01/30/19 1345    Subjective  Reports she was not too sore following last session. Pain is 3/10 today, reports she has been using heat which she thinks is helping.    Pertinent History  Pt is a 68 year old female s/p L reverse total shoulder 12/11/18. Reports she was immobilized in her sling for 2 weeks. After immobilization has been using passive motion machine for flexion. Has been doing AAROM flexion/abd/ER, bicep, shrugs, and forward punches with 1# with HHPT over the past week. MD Marlou Sa advised patient for out of sling, AAROM, PROM with no lifting of more than 5# until next followup with him (mid Oct). Pt reports  only 2/10 at rest, 6/10 with motion. Pain is at the medial upper arm and in the armpit. Pain is worse during the evening after moving it all day. Patient lives with her husband and her 4 dogs, which she walks frequently and cares for. She is R handed.Pt denies N/V, B&B changes, unexplained weight fluctuation, saddle paresthesia, fever, night sweats, or unrelenting night pain at this time.Pt denies N/V, B&B changes, unexplained weight fluctuation, saddle paresthesia, fever, night sweats, or unrelenting night pain at this time.    Limitations  House hold activities;Lifting    How long can you sit comfortably?  unlimited    How long can you stand comfortably?  unlimited    How long can you walk comfortably?  unlimited        Ther-Ex - SupineAAROM yellow theraball 0-145 degrees, long lever x15 -Supine Left shoulderabd0-105 degrees, long lever x15 - Left ER/IR (from neutral toward belly button) in sitting YTB 2x 10 each with cuing for posture initially with good carry over following, needing min cuing throughout - Seated rows YTB 2x 10 with cuing for posture initially with good carry over following - Standing shoulder flex 0-90d 1# DB 2x10 with cuing to prevent trunk lean compensation initially with good carry over following - Standing eccentric bicep curl 3# 2 x10with full elbow ext noted, pt reports she can "feel the stretch"   Manual STM withtrigger point releaseto pec major (along clavicle)/minor along insertion(most time spent here), L deltoid group, and L UT,PROM in all directions with sustained hold at end range                        PT Education - 01/30/19 1347    Education Details  Exercise form    Person(s) Educated  Patient    Methods  Explanation;Demonstration;Verbal cues    Comprehension  Verbalized understanding;Returned demonstration;Verbal cues required       PT Short Term Goals - 01/02/19 1121      PT SHORT TERM GOAL #1   Title  Pt will be  independent with HEP in order to improve strength and balance in order to improve function at home    Baseline  01/30/19 Completing HEP for motion without difficulty   Time  4    Period  Weeks    Status  New        PT Long Term Goals - 01/02/19 1115      PT LONG TERM GOAL #1   Title  Pt will decrease quick DASH score by at least 8% in order to demonstrate clinically significant reduction in disability.    Baseline  01/02/19 79.5%    Time  8    Period  Weeks  Status  New      PT LONG TERM GOAL #2   Title  Patient will demonstrate full AROM in L shoulder in order to complete household ADLs    Baseline  01/02/19 see eval    Time  8    Period  Weeks    Status  New      PT LONG TERM GOAL #3   Title  Pt will decrease worst pain as reported on NPRS by at least 3 points in order to demonstrate clinically significant reduction in pain.    Baseline  01/02/19 6/10 pain with movement    Time  8    Period  Weeks    Status  New      PT LONG TERM GOAL #4   Title  Pt will demonstrate gross bilat shoulder and periscapular strength of 4/10 in order to safely lift for heavy household chores    Baseline  01/02/19 see eval    Time  8    Period  Weeks    Status  New            Plan - 01/30/19 1416    Clinical Impression Statement  COntinued ROM and strengthening for L shoulder, periscapulars with good success, and postural carry over. Pt is able to comply with all cuing for proper technique/form of therex. No increased pain, only muscle fatigue. PT will continue progression as able.    Personal Factors and Comorbidities  Age;Sex;Behavior Pattern;Comorbidity 1;Fitness    Comorbidities  HTN    Examination-Activity Limitations  Dressing;Carry;Reach Overhead;Lift    Examination-Participation Restrictions  Driving;Community Activity;Cleaning    Stability/Clinical Decision Making  Evolving/Moderate complexity    Clinical Decision Making  Moderate    Rehab Potential  Good    PT Frequency  2x  / week    PT Duration  8 weeks    PT Treatment/Interventions  ADLs/Self Care Home Management;Electrical Stimulation;Therapeutic activities;Passive range of motion;Manual techniques;Patient/family education;Therapeutic exercise;Iontophoresis 78m/ml Dexamethasone;Moist Heat;Traction;Cryotherapy;Ultrasound;Functional mobility training;Neuromuscular re-education;Balance training;Dry needling;Joint Manipulations    PT Next Visit Plan  Continue to gently progress mobility and strength    PT Home Exercise Plan  AAROM cane, pulleys, grip strength, UT stretch    Consulted and Agree with Plan of Care  Patient       Patient will benefit from skilled therapeutic intervention in order to improve the following deficits and impairments:  Decreased mobility, Decreased endurance, Pain, Postural dysfunction, Impaired UE functional use, Impaired flexibility, Increased fascial restricitons, Decreased strength, Decreased coordination, Decreased range of motion, Improper body mechanics  Visit Diagnosis: Acute pain of left shoulder  Stiffness of left shoulder, not elsewhere classified  Muscle weakness (generalized)     Problem List Patient Active Problem List   Diagnosis Date Noted  . 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 Chronic left shoulder pain 08/23/2018  . Chronic diarrhea 12/07/2014  . Segmental colitis with rectal bleeding ([place] 09/12/2014  . Routine general medical examination at a health care facility 06/05/2014  . Overweight 06/13/2011  . ABNORMAL CHEST XRAY 09/30/2007  . Hyperlipidemia 02/17/2007  . Essential hypertension 01/08/2007  . Osteoarthritis 01/08/2007   CShelton SilvasPT, DPT CShelton Silvas10/21/2020, 2:27 PM  [facility] [address] [place] [address]Phone: [phone]  Fax:  [phone] Name: Stacy CHIRICOMRN: [id number] of Birth: [birth year]

## 2019-02-04 ENCOUNTER — Other Ambulatory Visit: Payer: Self-pay

## 2019-02-04 ENCOUNTER — Ambulatory Visit: Payer: Medicare Other

## 2019-02-04 DIAGNOSIS — M25612 Stiffness of left shoulder, not elsewhere classified: Secondary | ICD-10-CM | POA: Diagnosis not present

## 2019-02-04 DIAGNOSIS — M25512 Pain in left shoulder: Secondary | ICD-10-CM

## 2019-02-04 DIAGNOSIS — M6281 Muscle weakness (generalized): Secondary | ICD-10-CM

## 2019-02-04 NOTE — Therapy (Signed)
[facility] [address] [place], [address] Phone: [phone]   Fax:  [phone]  Physical Therapy Treatment  Patient Details  Name: [name] MRN: [id number] Date of Birth: [birth year] Referring Provider (PT): Lenon Oms MD   Encounter Date: 02/04/2019  PT End of Session - 02/04/19 1435    Visit Number  11    Number of Visits  17    Date for PT Re-Evaluation  02/27/19    PT Start Time  1430    PT Stop Time  1510    PT Time Calculation (min)  40 min    Activity Tolerance  Patient tolerated treatment well;No increased pain    Behavior During Therapy  WFL for tasks assessed/performed       Past Medical History:  Diagnosis Date  . Arthritis    back, fingers with joint pain and swelling.  chronic back pain  . Chronic back pain    arthritis   . Diverticulosis   . Elevated cholesterol    takes Niacin daily and Simvastatin  . GERD (gastroesophageal reflux disease) 06/2004   non-specific gastritis on EGD 06/2004  . Headache(784.0)    occasionally  . History of colon polyps 2004, 2009   2004:adenomatous. 2009 hyperplastic.   Marland Kitchen History of hiatal hernia   . Hypertension    takes Tenoretic and Lisinopril daily  . Mucoid cyst of joint 08/2013   right index finger  . Osteopenia 01/2014   T score -1.1 FRAX 14%/0.5%. Stable from prior DEXA  . PONV (postoperative nausea and vomiting)   . Rotator cuff arthropathy    Left  . Seasonal allergies   . Stroke ([place]) 1998   x 2 - mild left-sided weakness  . Urge incontinence   . Uterine prolapse     Past Surgical History:  Procedure Laterality Date  . BACK SURGERY     x 2  . BELPHAROPTOSIS REPAIR Bilateral 12/14/2017  . CATARACT EXTRACTION    . CATARACT EXTRACTION Bilateral 10/2017  . COLONOSCOPY  2004, 2009, 2014  . FINGER SURGERY    . GUM SURGERY    . GYNECOLOGIC CRYOSURGERY    . HYSTEROSCOPY W/D&C  12/07/2010   with resection of endometrial  polyp  . JOINT REPLACEMENT    . KNEE ARTHROSCOPY Left 2008  . lip biopsy     done at MD office Fri 11/22/13  . MASS EXCISION Right 08/15/2013   Procedure: RIGHT INDEX EXCISION MASS ;  Surgeon: Tennis Must, MD;  Location: [facility];  Service: Orthopedics;  Laterality: Right;  . NASAL SEPTUM SURGERY    . OOPHORECTOMY Right 2000  . REVERSE SHOULDER ARTHROPLASTY Left 12/11/2018  . REVERSE SHOULDER ARTHROPLASTY Left 12/11/2018   Procedure: LEFT REVERSE SHOULDER ARTHROPLASTY;  Surgeon: Meredith Pel, MD;  Location: [facility];  Service: Orthopedics;  Laterality: Left;  . SHOULDER ARTHROSCOPY WITH OPEN ROTATOR CUFF REPAIR AND DISTAL CLAVICLE ACROMINECTOMY Right 11/26/2013   Procedure: RIGHT SHOULDER ARTHROSCOPY WITH MINI OPEN ROTATOR CUFF REPAIR AND DISTAL CLAVICLE RESECTION, SUBACROMIAL DECOMPRESSION, POSSIBLE [facility] PATCH.;  Surgeon: [name], MD;  Location: [facility];  Service: Orthopedics;  Laterality: Right;  . TOTAL KNEE ARTHROPLASTY Right 03/21/2017  . TOTAL KNEE ARTHROPLASTY Right 03/21/2017   Procedure: RIGHT TOTAL KNEE ARTHROPLASTY;  Surgeon: [name], MD;  Location: [facility];  Service: Orthopedics;  Laterality: Right;  . TUBAL LIGATION      There were no vitals  filed for this visit.  Subjective Assessment - 02/04/19 1434    Subjective  Pt reports to be doing well, Arm pain is minimal to none unless using her arm.    Pertinent History  Pt is a 68 year old female s/p L reverse total shoulder 12/11/18. Reports she was immobilized in her sling for 2 weeks. After immobilization has been using passive motion machine for flexion. Has been doing AAROM flexion/abd/ER, bicep, shrugs, and forward punches with 1# with HHPT over the past week. MD Marlou Sa advised patient for out of sling, AAROM, PROM with no lifting of more than 5# until next followup with him (mid Oct). Pt reports only 2/10 at rest, 6/10 with motion. Pain is at the medial upper arm and in the armpit. Pain is  worse during the evening after moving it all day. Patient lives with her husband and her 4 dogs, which she walks frequently and cares for. She is R handed.Pt denies N/V, B&B changes, unexplained weight fluctuation, saddle paresthesia, fever, night sweats, or unrelenting night pain at this time.Pt denies N/V, B&B changes, unexplained weight fluctuation, saddle paresthesia, fever, night sweats, or unrelenting night pain at this time.    Currently in Pain?  No/denies      INTERVENTION THIS DATE: -AA/ROM supine wand chest press x15 -AA/ROM supine physioball wand-flexion overhead x15 -AA/ROM supine LUE ABDCT c wand 1x15  -AA/ROM wand ER to 45 degrees -Stand LUE ER belly to zero degrees YTB 1x10 (pt u -standing shoulder flexion 0-90 degrees with 1lb free weight 2x10 -standing left elbow flexion 3lb FW, 2x10 -standing cable extension 90-0 degrees 2x10 (lat pull bar) 10lb resistance -standing shoulder ABDCT 2x10, no weight, cues to cease ROM at onset of trunk compensation -standing cable row LUE only, 90 degrees to zero (avoiding extension) 2x5 @ 5lbs -MFR lateral and posterior deltoid left   PT Short Term Goals - 01/02/19 1121      PT SHORT TERM GOAL #1   Title  Pt will be independent with HEP in order to improve strength and balance in order to improve function at home    Baseline  01/02/19    Time  4    Period  Weeks    Status  New        PT Long Term Goals - 01/02/19 1115      PT LONG TERM GOAL #1   Title  Pt will decrease quick DASH score by at least 8% in order to demonstrate clinically significant reduction in disability.    Baseline  01/02/19 79.5%    Time  8    Period  Weeks    Status  New      PT LONG TERM GOAL #2   Title  Patient will demonstrate full AROM in L shoulder in order to complete household ADLs    Baseline  01/02/19 see eval    Time  8    Period  Weeks    Status  New      PT LONG TERM GOAL #3   Title  Pt will decrease worst pain as reported on NPRS by at  least 3 points in order to demonstrate clinically significant reduction in pain.    Baseline  01/02/19 6/10 pain with movement    Time  8    Period  Weeks    Status  New      PT LONG TERM GOAL #4   Title  Pt will demonstrate gross bilat shoulder and periscapular strength  of 4/10 in order to safely lift for heavy household chores    Baseline  01/02/19 see eval    Time  8    Period  Weeks    Status  New            Plan - 02/04/19 1435    Clinical Impression Statement  Continued with curren tplan of care, gently progressing patient's program aimed at address deficits and limitations identified in evlauation. Pt continues to make steady progress toward treatment goals in general. Authro provides extensive verbal, visual, and tactile cues when needed to assure all interventions are performed with desired form and good accuracy. Extensive communicaiton to assure pt is able to perform all activities without exacerbation of pain or other symptoms. Pt able to progress some of her strengthening exercises this date.     Rehab Potential  Good    PT Frequency  2x / week    PT Duration  8 weeks    PT Treatment/Interventions  ADLs/Self Care Home Management;Electrical Stimulation;Therapeutic activities;Passive range of motion;Manual techniques;Patient/family education;Therapeutic exercise;Iontophoresis 65m/ml Dexamethasone;Moist Heat;Traction;Cryotherapy;Ultrasound;Functional mobility training;Neuromuscular re-education;Balance training;Dry needling;Joint Manipulations    PT Next Visit Plan  Continue to gently progress mobility and strength    PT Home Exercise Plan  AAROM cane, pulleys, grip strength, UT stretch    Consulted and Agree with Plan of Care  Patient       Patient will benefit from skilled therapeutic intervention in order to improve the following deficits and impairments:  Decreased mobility, Decreased endurance, Pain, Postural dysfunction, Impaired UE functional use, Impaired flexibility,  Increased fascial restricitons, Decreased strength, Decreased coordination, Decreased range of motion, Improper body mechanics  Visit Diagnosis: Acute pain of left shoulder  Stiffness of left shoulder, not elsewhere classified  Muscle weakness (generalized)     Problem List Patient Active Problem List   Diagnosis Date Noted  . 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 Chronic left shoulder pain 08/23/2018  . Chronic diarrhea 12/07/2014  . Segmental colitis with rectal bleeding ([place] 09/12/2014  . Routine general medical examination at a health care facility 06/05/2014  . Overweight 06/13/2011  . ABNORMAL CHEST XRAY 09/30/2007  . Hyperlipidemia 02/17/2007  . Essential hypertension 01/08/2007  . Osteoarthritis 01/08/2007   3:02 PM, 02/04/19 AEtta Grandchild PT, DPT Physical Therapist - [phone](Office)    Keiry Kowal C 02/04/2019, 2:37 PM  [facility] [address] [place] [address]Phone: [phone]  Fax:  [phone] Name: Stacy FOGLEMANMRN: [id number] of Birth: 310/23/1952

## 2019-02-06 ENCOUNTER — Ambulatory Visit: Payer: Medicare Other | Admitting: Physical Therapy

## 2019-02-06 ENCOUNTER — Other Ambulatory Visit: Payer: Self-pay

## 2019-02-06 ENCOUNTER — Encounter: Payer: Self-pay | Admitting: Physical Therapy

## 2019-02-06 DIAGNOSIS — M6281 Muscle weakness (generalized): Secondary | ICD-10-CM

## 2019-02-06 DIAGNOSIS — M25612 Stiffness of left shoulder, not elsewhere classified: Secondary | ICD-10-CM | POA: Diagnosis not present

## 2019-02-06 DIAGNOSIS — M25512 Pain in left shoulder: Secondary | ICD-10-CM | POA: Diagnosis not present

## 2019-02-06 NOTE — Therapy (Signed)
[facility] [address] [place], [address] Phone: [phone]   Fax:  [phone]  Physical Therapy Treatment  Patient Details  Name: [name] MRN: [id number] Date of Birth: 1950-06-08 Referring Provider (PT): Lenon Oms MD   Encounter Date: 02/06/2019  PT End of Session - 02/06/19 1406    Visit Number  12    Number of Visits  17    Date for PT Re-Evaluation  02/27/19    PT Start Time  0145    PT Stop Time  0230    PT Time Calculation (min)  45 min    Activity Tolerance  Patient tolerated treatment well;No increased pain    Behavior During Therapy  WFL for tasks assessed/performed       Past Medical History:  Diagnosis Date  . Arthritis    back, fingers with joint pain and swelling.  chronic back pain  . Chronic back pain    arthritis   . Diverticulosis   . Elevated cholesterol    takes Niacin daily and Simvastatin  . GERD (gastroesophageal reflux disease) 06/2004   non-specific gastritis on EGD 06/2004  . Headache(784.0)    occasionally  . History of colon polyps 2004, 2009   2004:adenomatous. 2009 hyperplastic.   Marland Kitchen History of hiatal hernia   . Hypertension    takes Tenoretic and Lisinopril daily  . Mucoid cyst of joint 08/2013   right index finger  . Osteopenia 01/2014   T score -1.1 FRAX 14%/0.5%. Stable from prior DEXA  . PONV (postoperative nausea and vomiting)   . Rotator cuff arthropathy    Left  . Seasonal allergies   . Stroke ([place]) 1998   x 2 - mild left-sided weakness  . Urge incontinence   . Uterine prolapse     Past Surgical History:  Procedure Laterality Date  . BACK SURGERY     x 2  . BELPHAROPTOSIS REPAIR Bilateral 12/14/2017  . CATARACT EXTRACTION    . CATARACT EXTRACTION Bilateral 10/2017  . COLONOSCOPY  2004, 2009, 2014  . FINGER SURGERY    . GUM SURGERY    . GYNECOLOGIC CRYOSURGERY    . HYSTEROSCOPY W/D&C  12/07/2010   with resection of endometrial  polyp  . JOINT REPLACEMENT    . KNEE ARTHROSCOPY Left 2008  . lip biopsy     done at MD office Fri 11/22/13  . MASS EXCISION Right 08/15/2013   Procedure: RIGHT INDEX EXCISION MASS ;  Surgeon: Tennis Must, MD;  Location: [place];  Service: Orthopedics;  Laterality: Right;  . NASAL SEPTUM SURGERY    . OOPHORECTOMY Right 2000  . REVERSE SHOULDER ARTHROPLASTY Left 12/11/2018  . REVERSE SHOULDER ARTHROPLASTY Left 12/11/2018   Procedure: LEFT REVERSE SHOULDER ARTHROPLASTY;  Surgeon: Meredith Pel, MD;  Location: [place];  Service: Orthopedics;  Laterality: Left;  . SHOULDER ARTHROSCOPY WITH OPEN ROTATOR CUFF REPAIR AND DISTAL CLAVICLE ACROMINECTOMY Right 11/26/2013   Procedure: RIGHT SHOULDER ARTHROSCOPY WITH MINI OPEN ROTATOR CUFF REPAIR AND DISTAL CLAVICLE RESECTION, SUBACROMIAL DECOMPRESSION, POSSIBLE [facility] PATCH.;  Surgeon: [name], MD;  Location: [place];  Service: Orthopedics;  Laterality: Right;  . TOTAL KNEE ARTHROPLASTY Right 03/21/2017  . TOTAL KNEE ARTHROPLASTY Right 03/21/2017   Procedure: RIGHT TOTAL KNEE ARTHROPLASTY;  Surgeon: [name], MD;  Location: [place] [facility];  Service: Orthopedics;  Laterality: Right;  . TUBAL LIGATION      There were no vitals  filed for this visit.  Subjective Assessment - 02/06/19 1352    Subjective  Doing well, thinks motion is improving. Reports 3/10 pain with some motions, minimal pain overall.    Pertinent History  Pt is a 68 year old female s/p L reverse total shoulder 12/11/18. Reports she was immobilized in her sling for 2 weeks. After immobilization has been using passive motion machine for flexion. Has been doing AAROM flexion/abd/ER, bicep, shrugs, and forward punches with 1# with HHPT over the past week. MD Marlou Sa advised patient for out of sling, AAROM, PROM with no lifting of more than 5# until next followup with him (mid Oct). Pt reports only 2/10 at rest, 6/10 with motion. Pain is at the medial upper arm and in the  armpit. Pain is worse during the evening after moving it all day. Patient lives with her husband and her 4 dogs, which she walks frequently and cares for. She is R handed.Pt denies N/V, B&B changes, unexplained weight fluctuation, saddle paresthesia, fever, night sweats, or unrelenting night pain at this time.Pt denies N/V, B&B changes, unexplained weight fluctuation, saddle paresthesia, fever, night sweats, or unrelenting night pain at this time.    Limitations  House hold activities;Lifting    How long can you sit comfortably?  unlimited    How long can you stand comfortably?  unlimited    How long can you walk comfortably?  unlimited    Diagnostic tests  Xrays    Patient Stated Goals  Being able to complete daily tasks      INTERVENTION THIS DATE: -AA/ROM supine wand chest press x15 -AA/ROM supine wand-flexion overhead x15 -AA/ROM supine LUE ABDCT c wand 1x15  -AA/ROM wand ER to 45 degrees x15 -standing shoulder flexion 0-90 degrees with 1lb free weight 2x10 -standing left elbow flexion 3lb FW, 2x10, min cuing for posture with good carry over -standing shoulder ABDCT x10 no weight; x10 with 1# DB , much better carry over of no trunk compensation - BUE shoulder ext with scapular retraction RTB 2x 10 with demo and min cuing needed for    Manual STM withtrigger point releaseto pec major(along clavicle)/minoralong insertion, L deltoid group (most time spent here) PROM in all directions with sustained hold at end range                           PT Education - 02/06/19 1405    Education Details  exercise form    Person(s) Educated  Patient    Methods  Explanation;Verbal cues;Demonstration    Comprehension  Verbalized understanding;Verbal cues required;Returned demonstration       PT Short Term Goals - 01/02/19 1121      PT SHORT TERM GOAL #1   Title  Pt will be independent with HEP in order to improve strength and balance in order to improve function at home     Baseline  01/02/19    Time  4    Period  Weeks    Status  New        PT Long Term Goals - 01/02/19 1115      PT LONG TERM GOAL #1   Title  Pt will decrease quick DASH score by at least 8% in order to demonstrate clinically significant reduction in disability.    Baseline  01/02/19 79.5%    Time  8    Period  Weeks    Status  New      PT LONG  TERM GOAL #2   Title  Patient will demonstrate full AROM in L shoulder in order to complete household ADLs    Baseline  01/02/19 see eval    Time  8    Period  Weeks    Status  New      PT LONG TERM GOAL #3   Title  Pt will decrease worst pain as reported on NPRS by at least 3 points in order to demonstrate clinically significant reduction in pain.    Baseline  01/02/19 6/10 pain with movement    Time  8    Period  Weeks    Status  New      PT LONG TERM GOAL #4   Title  Pt will demonstrate gross bilat shoulder and periscapular strength of 4/10 in order to safely lift for heavy household chores    Baseline  01/02/19 see eval    Time  8    Period  Weeks    Status  New            Plan - 02/06/19 1416    Clinical Impression Statement  Continued therex progression for increased motion and strength with good success. Patient is able to comply with all cuing for proper therex form, with correct muscle activation, and no increased pain (only muscle fatigue). Will continue progression as able.    Personal Factors and Comorbidities  Age;Sex;Behavior Pattern;Comorbidity 1;Fitness    Comorbidities  HTN    Examination-Activity Limitations  Dressing;Carry;Reach Overhead;Lift    Examination-Participation Restrictions  Driving;Community Activity;Cleaning    Stability/Clinical Decision Making  Evolving/Moderate complexity    Clinical Decision Making  Moderate    Rehab Potential  Good    PT Frequency  2x / week    PT Duration  8 weeks    PT Treatment/Interventions  ADLs/Self Care Home Management;Electrical Stimulation;Therapeutic  activities;Passive range of motion;Manual techniques;Patient/family education;Therapeutic exercise;Iontophoresis 72m/ml Dexamethasone;Moist Heat;Traction;Cryotherapy;Ultrasound;Functional mobility training;Neuromuscular re-education;Balance training;Dry needling;Joint Manipulations    PT Next Visit Plan  Continue to gently progress mobility and strength    PT Home Exercise Plan  AAROM cane, pulleys, grip strength, UT stretch    Consulted and Agree with Plan of Care  Patient       Patient will benefit from skilled therapeutic intervention in order to improve the following deficits and impairments:  Decreased mobility, Decreased endurance, Pain, Postural dysfunction, Impaired UE functional use, Impaired flexibility, Increased fascial restricitons, Decreased strength, Decreased coordination, Decreased range of motion, Improper body mechanics  Visit Diagnosis: Acute pain of left shoulder  Stiffness of left shoulder, not elsewhere classified  Muscle weakness (generalized)     Problem List Patient Active Problem List   Diagnosis Date Noted  . 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 Chronic left shoulder pain 08/23/2018  . Chronic diarrhea 12/07/2014  . [place] 09/12[place]  . Routine general medical examination at a health care facility 06/05/2014  . Overweight 06/13/2011  . ABNORMAL CHEST XRAY 09/30/2007  . Hyperlipidemia 02/17/2007  . Essential hypertension 01/08/2007  . Osteoarthritis 01/08/2007   CShelton SilvasPT, DPT CShelton Silvas10/28/2020, 2:26 PM  [facility] [address] [place] [address]Phone: [phone]  Fax:  [phone] Name: Stacy THAUMRN: [id number] of Birth: 301-05-1950

## 2019-02-11 ENCOUNTER — Other Ambulatory Visit: Payer: Self-pay

## 2019-02-11 ENCOUNTER — Ambulatory Visit: Payer: Medicare Other | Attending: Orthopedic Surgery

## 2019-02-11 DIAGNOSIS — M6281 Muscle weakness (generalized): Secondary | ICD-10-CM

## 2019-02-11 DIAGNOSIS — M25512 Pain in left shoulder: Secondary | ICD-10-CM | POA: Insufficient documentation

## 2019-02-11 DIAGNOSIS — M25572 Pain in left ankle and joints of left foot: Secondary | ICD-10-CM | POA: Insufficient documentation

## 2019-02-11 DIAGNOSIS — M25612 Stiffness of left shoulder, not elsewhere classified: Secondary | ICD-10-CM | POA: Diagnosis not present

## 2019-02-11 DIAGNOSIS — R262 Difficulty in walking, not elsewhere classified: Secondary | ICD-10-CM | POA: Diagnosis not present

## 2019-02-11 NOTE — Therapy (Signed)
[facility] [address] [place], [address] Phone: [phone]   Fax:  [phone]  Physical Therapy Treatment  Patient Details  Name: [name] MRN: [id number] Date of Birth: [birth year] Referring Provider (PT): Lenon Oms MD   Encounter Date: 02/11/2019  PT End of Session - 02/11/19 1310    Visit Number  13    Number of Visits  17    Date for PT Re-Evaluation  02/27/19    PT Start Time  2336    PT Stop Time  1344    PT Time Calculation (min)  40 min    Activity Tolerance  Patient tolerated treatment well;No increased pain    Behavior During Therapy  WFL for tasks assessed/performed       Past Medical History:  Diagnosis Date  . Arthritis    back, fingers with joint pain and swelling.  chronic back pain  . Chronic back pain    arthritis   . Diverticulosis   . Elevated cholesterol    takes Niacin daily and Simvastatin  . GERD (gastroesophageal reflux disease) 06/2004   non-specific gastritis on EGD 06/2004  . Headache(784.0)    occasionally  . History of colon polyps 2004, 2009   2004:adenomatous. 2009 hyperplastic.   Marland Kitchen History of hiatal hernia   . Hypertension    takes Tenoretic and Lisinopril daily  . Mucoid cyst of joint 08/2013   right index finger  . Osteopenia 01/2014   T score -1.1 FRAX 14%/0.5%. Stable from prior DEXA  . PONV (postoperative nausea and vomiting)   . Rotator cuff arthropathy    Left  . Seasonal allergies   . Stroke ([place]) 1998   x 2 - mild left-sided weakness  . Urge incontinence   . Uterine prolapse     Past Surgical History:  Procedure Laterality Date  . BACK SURGERY     x 2  . BELPHAROPTOSIS REPAIR Bilateral 12/14/2017  . CATARACT EXTRACTION    . CATARACT EXTRACTION Bilateral 10/2017  . COLONOSCOPY  2004, 2009, 2014  . FINGER SURGERY    . GUM SURGERY    . GYNECOLOGIC CRYOSURGERY    . HYSTEROSCOPY W/D&C  12/07/2010   with resection of endometrial  polyp  . JOINT REPLACEMENT    . KNEE ARTHROSCOPY Left 2008  . lip biopsy     done at MD office Fri 11/22/13  . MASS EXCISION Right 08/15/2013   Procedure: RIGHT INDEX EXCISION MASS ;  Surgeon: Tennis Must, MD;  Location: [facility];  Service: Orthopedics;  Laterality: Right;  . NASAL SEPTUM SURGERY    . OOPHORECTOMY Right 2000  . REVERSE SHOULDER ARTHROPLASTY Left 12/11/2018  . REVERSE SHOULDER ARTHROPLASTY Left 12/11/2018   Procedure: LEFT REVERSE SHOULDER ARTHROPLASTY;  Surgeon: Meredith Pel, MD;  Location: [facility];  Service: Orthopedics;  Laterality: Left;  . SHOULDER ARTHROSCOPY WITH OPEN ROTATOR CUFF REPAIR AND DISTAL CLAVICLE ACROMINECTOMY Right 11/26/2013   Procedure: RIGHT SHOULDER ARTHROSCOPY WITH MINI OPEN ROTATOR CUFF REPAIR AND DISTAL CLAVICLE RESECTION, SUBACROMIAL DECOMPRESSION, POSSIBLE [facility] PATCH.;  Surgeon: [name], MD;  Location: [place];  Service: Orthopedics;  Laterality: Right;  . TOTAL KNEE ARTHROPLASTY Right 03/21/2017  . TOTAL KNEE ARTHROPLASTY Right 03/21/2017   Procedure: RIGHT TOTAL KNEE ARTHROPLASTY;  Surgeon: [name], MD;  Location: [place] [facility];  Service: Orthopedics;  Laterality: Right;  . TUBAL LIGATION      There were no vitals  filed for this visit.  Subjective Assessment - 02/11/19 1309    Subjective  Pt doing well this date. Pt reports no updates since last session. Pain continues to be well controlled and consistent.    Pertinent History  Pt is a 68 year old female s/p L reverse total shoulder 12/11/18. Reports she was immobilized in her sling for 2 weeks. After immobilization has been using passive motion machine for flexion. Has been doing AAROM flexion/abd/ER, bicep, shrugs, and forward punches with 1# with HHPT over the past week. MD Marlou Sa advised patient for out of sling, AAROM, PROM with no lifting of more than 5# until next followup with him (mid Oct). Pt reports only 2/10 at rest, 6/10 with motion. Pain is at the medial  upper arm and in the armpit. Pain is worse during the evening after moving it all day. Patient lives with her husband and her 4 dogs, which she walks frequently and cares for. She is R handed.Pt denies N/V, B&B changes, unexplained weight fluctuation, saddle paresthesia, fever, night sweats, or unrelenting night pain at this time.Pt denies N/V, B&B changes, unexplained weight fluctuation, saddle paresthesia, fever, night sweats, or unrelenting night pain at this time.    Currently in Pain?  Yes    Pain Score  3     Pain Location  --   Left shoulder      INTERVENTION THIS DATE: -BUE shoulder AA/ROM in sagittal plane, seated with physioball 10secH x 4 minutes -LUE ball AA/ROM ABDCT x3 minutes  -Standing Left GHJ ER 2x15 (no resistance, A/ROM only, remains quite difficult s AROM over 0 degrees) -Standing LUE GHJ IR, 0 degrees to belly 2x20 c YTB -standing left elbow flexion 3lb FW, 2x10 -sidelying Left GHJ ER stretch (P/ROM) 5x15secH  -Sidleying Left GHJ ER A/ROM 1x10, active assistance from author at end range to combant soft tissue restriction, pt fatigues quickly here -standing shoulder flexion 0-90 degrees with 1lb free weight 2x10 -sidelying LUE ABDCT neutral to 90 degrees, no weight 2x15  -standing YTB Shoulder extension 90-0 degrees 1x15 bilat -standing LUE yellow TB row to zero degrees extension 2x15       PT Short Term Goals - 01/02/19 1121      PT SHORT TERM GOAL #1   Title  Pt will be independent with HEP in order to improve strength and balance in order to improve function at home    Baseline  01/02/19    Time  4    Period  Weeks    Status  New        PT Long Term Goals - 01/02/19 1115      PT LONG TERM GOAL #1   Title  Pt will decrease quick DASH score by at least 8% in order to demonstrate clinically significant reduction in disability.    Baseline  01/02/19 79.5%    Time  8    Period  Weeks    Status  New      PT LONG TERM GOAL #2   Title  Patient will  demonstrate full AROM in L shoulder in order to complete household ADLs    Baseline  01/02/19 see eval    Time  8    Period  Weeks    Status  New      PT LONG TERM GOAL #3   Title  Pt will decrease worst pain as reported on NPRS by at least 3 points in order to demonstrate clinically significant reduction in pain.  Baseline  01/02/19 6/10 pain with movement    Time  8    Period  Weeks    Status  New      PT LONG TERM GOAL #4   Title  Pt will demonstrate gross bilat shoulder and periscapular strength of 4/10 in order to safely lift for heavy household chores    Baseline  01/02/19 see eval    Time  8    Period  Weeks    Status  New            Plan - 02/11/19 1312    Clinical Impression Statement  Continued with current plan of care, gently progressing patient's program aimed at address deficits and limitations identified in evlauation. Pt continues to make steady progress toward treatment goals in general. Author provides extensive verbal, visual, and tactile cues when needed to assure all interventions are performed with desired form and good accuracy. Extensive communicaiton to assure pt is able to perform all activities without exacerbation of pain or other symptoms. Scar looks excellent, pt continues to work with vit E oil. Pt encouraged to work scar a little deeper as superficial layers continue to soften up. Pt given extensive cues this session to remain with ROM restrictions postoperatively.    Rehab Potential  Good    PT Frequency  2x / week    PT Duration  8 weeks    PT Treatment/Interventions  ADLs/Self Care Home Management;Electrical Stimulation;Therapeutic activities;Passive range of motion;Manual techniques;Patient/family education;Therapeutic exercise;Iontophoresis 43m/ml Dexamethasone;Moist Heat;Traction;Cryotherapy;Ultrasound;Functional mobility training;Neuromuscular re-education;Balance training;Dry needling;Joint Manipulations    PT Next Visit Plan  Continue to gently  progress mobility and strength    PT Home Exercise Plan  AAROM cane, pulleys, grip strength, UT stretch    Consulted and Agree with Plan of Care  Patient       Patient will benefit from skilled therapeutic intervention in order to improve the following deficits and impairments:  Decreased mobility, Decreased endurance, Pain, Postural dysfunction, Impaired UE functional use, Impaired flexibility, Increased fascial restricitons, Decreased strength, Decreased coordination, Decreased range of motion, Improper body mechanics  Visit Diagnosis: Acute pain of left shoulder  Stiffness of left shoulder, not elsewhere classified  Muscle weakness (generalized)     Problem List Patient Active Problem List   Diagnosis Date Noted  . 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 Chronic left shoulder pain 08/23/2018  . Chronic diarrhea 12/07/2014  . Segmental colitis with rectal bleeding [place] 09/12/2014  . Routine general medical examination at a health care facility 06/05/2014  . Overweight 06/13/2011  . ABNORMAL CHEST XRAY 09/30/2007  . Hyperlipidemia 02/17/2007  . Essential hypertension 01/08/2007  . Osteoarthritis 01/08/2007   1:43 PM, 02/11/19 AEtta Grandchild PT, DPT Physical Therapist - [id number] [phone](Office)   Wallie Lagrand C 02/11/2019, 1:14 PM  [facility] [address] [place] [address]Phone: [phone]  Fax:  [phone] Name: Stacy CHAMPAGNEMRN: [id number] of Birth: 313-Oct-1952

## 2019-02-13 ENCOUNTER — Ambulatory Visit: Payer: Medicare Other

## 2019-02-13 ENCOUNTER — Other Ambulatory Visit: Payer: Self-pay

## 2019-02-13 DIAGNOSIS — M6281 Muscle weakness (generalized): Secondary | ICD-10-CM

## 2019-02-13 DIAGNOSIS — M25612 Stiffness of left shoulder, not elsewhere classified: Secondary | ICD-10-CM

## 2019-02-13 DIAGNOSIS — M25512 Pain in left shoulder: Secondary | ICD-10-CM

## 2019-02-13 DIAGNOSIS — R262 Difficulty in walking, not elsewhere classified: Secondary | ICD-10-CM | POA: Diagnosis not present

## 2019-02-13 DIAGNOSIS — M25572 Pain in left ankle and joints of left foot: Secondary | ICD-10-CM | POA: Diagnosis not present

## 2019-02-13 NOTE — Therapy (Signed)
[facility] [address] [place], [address] Phone: [phone]   Fax:  [phone]  Physical Therapy Treatment  Patient Details  Name: LENORIA NARINE MRN: [id number] Date of Birth: 05/21/1950 Referring Provider (PT): Lenon Oms MD   Encounter Date: 02/13/2019  PT End of Session - 02/13/19 1305    Visit Number  14    Number of Visits  17    Date for PT Re-Evaluation  02/27/19    PT Start Time  1302    PT Stop Time  1342    PT Time Calculation (min)  40 min    Activity Tolerance  Patient tolerated treatment well;No increased pain    Behavior During Therapy  WFL for tasks assessed/performed       Past Medical History:  Diagnosis Date  . Arthritis    back, fingers with joint pain and swelling.  chronic back pain  . Chronic back pain    arthritis   . Diverticulosis   . Elevated cholesterol    takes Niacin daily and Simvastatin  . GERD (gastroesophageal reflux disease) 06/2004   non-specific gastritis on EGD 06/2004  . Headache(784.0)    occasionally  . History of colon polyps 2004, 2009   2004:adenomatous. 2009 hyperplastic.   Marland Kitchen History of hiatal hernia   . Hypertension    takes Tenoretic and Lisinopril daily  . Mucoid cyst of joint 08/2013   right index finger  . Osteopenia 01/2014   T score -1.1 FRAX 14%/0.5%. Stable from prior DEXA  . PONV (postoperative nausea and vomiting)   . Rotator cuff arthropathy    Left  . Seasonal allergies   . Stroke ([place]) 1998   x 2 - mild left-sided weakness  . Urge incontinence   . Uterine prolapse     Past Surgical History:  Procedure Laterality Date  . BACK SURGERY     x 2  . BELPHAROPTOSIS REPAIR Bilateral 12/14/2017  . CATARACT EXTRACTION    . CATARACT EXTRACTION Bilateral 10/2017  . COLONOSCOPY  2004, 2009, 2014  . FINGER SURGERY    . GUM SURGERY    . GYNECOLOGIC CRYOSURGERY    . HYSTEROSCOPY W/D&C  12/07/2010   with resection of endometrial  polyp  . JOINT REPLACEMENT    . KNEE ARTHROSCOPY Left 2008  . lip biopsy     done at MD office Fri 11/22/13  . MASS EXCISION Right 08/15/2013   Procedure: RIGHT INDEX EXCISION MASS ;  Surgeon: Tennis Must, MD;  Location: [facility];  Service: Orthopedics;  Laterality: Right;  . NASAL SEPTUM SURGERY    . OOPHORECTOMY Right 2000  . REVERSE SHOULDER ARTHROPLASTY Left 12/11/2018  . REVERSE SHOULDER ARTHROPLASTY Left 12/11/2018   Procedure: LEFT REVERSE SHOULDER ARTHROPLASTY;  Surgeon: Meredith Pel, MD;  Location: [facility];  Service: Orthopedics;  Laterality: Left;  . SHOULDER ARTHROSCOPY WITH OPEN ROTATOR CUFF REPAIR AND DISTAL CLAVICLE ACROMINECTOMY Right 11/26/2013   Procedure: RIGHT SHOULDER ARTHROSCOPY WITH MINI OPEN ROTATOR CUFF REPAIR AND DISTAL CLAVICLE RESECTION, SUBACROMIAL DECOMPRESSION, POSSIBLE [facility] PATCH.;  Surgeon: [name], MD;  Location: [facility];  Service: Orthopedics;  Laterality: Right;  . TOTAL KNEE ARTHROPLASTY Right 03/21/2017  . TOTAL KNEE ARTHROPLASTY Right 03/21/2017   Procedure: RIGHT TOTAL KNEE ARTHROPLASTY;  Surgeon: [name], MD;  Location: [facility];  Service: Orthopedics;  Laterality: Right;  . TUBAL LIGATION      There were no vitals  filed for this visit.  Subjective Assessment - 02/13/19 1305    Subjective  Pt feeling btter ths date, was sore up until yeswterday from PT session on Monday. Pt reports no pain when not moving.    Pertinent History  Pt is a 68 year old female s/p L reverse total shoulder 12/11/18. Reports she was immobilized in her sling for 2 weeks. After immobilization has been using passive motion machine for flexion. Has been doing AAROM flexion/abd/ER, bicep, shrugs, and forward punches with 1# with HHPT over the past week. MD Marlou Sa advised patient for out of sling, AAROM, PROM with no lifting of more than 5# until next followup with him (mid Oct). Pt reports only 2/10 at rest, 6/10 with motion. Pain is at the medial  upper arm and in the armpit. Pain is worse during the evening after moving it all day. Patient lives with her husband and her 4 dogs, which she walks frequently and cares for. She is R handed.Pt denies N/V, B&B changes, unexplained weight fluctuation, saddle paresthesia, fever, night sweats, or unrelenting night pain at this time.Pt denies N/V, B&B changes, unexplained weight fluctuation, saddle paresthesia, fever, night sweats, or unrelenting night pain at this time.       INTERVENTION THIS DATE:  -Left shoulder flexion P/ROM: 126 degrees -Left shoulder abduction P/ROM: 95 degrees -Left shoulder external rotation P/ROM -BUE shoulder AA/ROM in sagittal plane, seated with physioball: 3x30sec, 10x3secH -LUE ball AA/ROM ABDCT 3x30sec, 10x3secH -Standing against door, AA/ROM LUE external rotation with cane  -RedTB wrist flexion 2x15, neutral rotation -RedTB wrist extension 2x15, neutral rotation -LUE short short lever flexion "hand to crown of head) 1x12  -LUE elbow flexion 1x10 c 3lb FW -BUE scapular elevation 5lb each hand 1x15  -BUE YTB row to neutral extension from 75 degrees, cues for scapular retraction at end 1x15 -LUE abduction to 90 degrees AA/ROM 1x15 (minA provided for last 15 degrees for pain control and consistent movement quality) -LUE flexion to 90 degrees AA/ROM 1x15 (minA provided for last 15 degrees for pain control and consistent movement quality) -MFR and sof ttissue mobilization to Left middle and posterior deltoid x 3 minutes (not concurrent with any other intervention)   PT Short Term Goals - 02/13/19 1308      PT SHORT TERM GOAL #1   Title  Pt will be independent with HEP in order to improve strength and balance in order to improve function at home    Baseline  01/02/19    Time  4    Period  Weeks    Status  Achieved        PT Long Term Goals - 02/13/19 1308      PT LONG TERM GOAL #1   Title  Pt will decrease quick DASH score by at least 8% in order to  demonstrate clinically significant reduction in disability.    Baseline  01/02/19 79.5%    Time  8    Period  Weeks    Status  On-going      PT LONG TERM GOAL #2   Title  Patient will demonstrate full AROM in L shoulder in order to complete household ADLs    Baseline  01/02/19 74* Flexion, 58* ABDCT: 02/13/19: LUE shoulder fleixon 126*, abduction 95*    Time  8    Period  Weeks    Status  On-going      PT LONG TERM GOAL #3   Title  Pt will decrease worst pain as  reported on NPRS by at least 3 points in order to demonstrate clinically significant reduction in pain.    Baseline  01/02/19 6/10 pain with movement    Time  8    Period  Weeks    Status  Achieved      PT LONG TERM GOAL #4   Title  Pt will demonstrate gross bilat shoulder and periscapular strength of 4/10 in order to safely lift for heavy household chores    Baseline  01/02/19 see eval    Time  8    Period  Weeks    Status  On-going            Plan - 02/13/19 1307    Clinical Impression Statement  Pt able to complete entire session as planned with rest breaks provided as needed. Pt maintains high level of focus and motivation. Extensive verbal, visual, and tactile cues are provided for most accurate form possible. Author provides minA intermittently for full ROM when needed. Overall pt continues to make steady progress toward treatment goals. Updated P/ROM assessed this date showing improvements from evaluation and still within post-op protocol guidelines. Strength continues to improve each session.    Stability/Clinical Decision Making  Evolving/Moderate complexity    Clinical Decision Making  Moderate    Rehab Potential  Good    PT Frequency  2x / week    PT Duration  8 weeks    PT Treatment/Interventions  ADLs/Self Care Home Management;Electrical Stimulation;Therapeutic activities;Passive range of motion;Manual techniques;Patient/family education;Therapeutic exercise;Iontophoresis 24m/ml Dexamethasone;Moist  Heat;Traction;Cryotherapy;Ultrasound;Functional mobility training;Neuromuscular re-education;Balance training;Dry needling;Joint Manipulations    PT Next Visit Plan  Continue to gently progress mobility and strength    PT Home Exercise Plan  AAROM cane, pulleys, grip strength, UT stretch       Patient will benefit from skilled therapeutic intervention in order to improve the following deficits and impairments:  Decreased mobility, Decreased endurance, Pain, Postural dysfunction, Impaired UE functional use, Impaired flexibility, Increased fascial restricitons, Decreased strength, Decreased coordination, Decreased range of motion, Improper body mechanics  Visit Diagnosis: Acute pain of left shoulder  Stiffness of left shoulder, not elsewhere classified  Muscle weakness (generalized)     Problem List Patient Active Problem List   Diagnosis Date Noted  . 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 Chronic left shoulder pain 08/23/2018  . Chronic diarrhea 12/07/2014  . Segmental [place] 09/12/2014  . Routine general medical examination at a health care facility 06/05/2014  . Overweight 06/13/2011  . ABNORMAL CHEST XRAY 09/30/2007  . Hyperlipidemia 02/17/2007  . Essential hypertension 01/08/2007  . Osteoarthritis 01/08/2007   1:44 PM, 02/13/19 AEtta Grandchild PT, DPT Physical Therapist - CDennis [phone](Office)    Betzabeth Derringer C 02/13/2019, 1:17 PM  [facility] [address] [place] [address]Phone: [phone]  Fax:  [phone] Name: NXIN KLAWITTERMRN: [id number] of Birth: [birth year]

## 2019-02-18 ENCOUNTER — Encounter: Payer: Self-pay | Admitting: Physical Therapy

## 2019-02-18 ENCOUNTER — Ambulatory Visit: Payer: Medicare Other | Admitting: Physical Therapy

## 2019-02-18 ENCOUNTER — Other Ambulatory Visit: Payer: Self-pay

## 2019-02-18 DIAGNOSIS — R262 Difficulty in walking, not elsewhere classified: Secondary | ICD-10-CM | POA: Diagnosis not present

## 2019-02-18 DIAGNOSIS — M25572 Pain in left ankle and joints of left foot: Secondary | ICD-10-CM | POA: Diagnosis not present

## 2019-02-18 DIAGNOSIS — M6281 Muscle weakness (generalized): Secondary | ICD-10-CM | POA: Diagnosis not present

## 2019-02-18 DIAGNOSIS — M25512 Pain in left shoulder: Secondary | ICD-10-CM | POA: Diagnosis not present

## 2019-02-18 DIAGNOSIS — M25612 Stiffness of left shoulder, not elsewhere classified: Secondary | ICD-10-CM

## 2019-02-18 NOTE — Therapy (Signed)
[facility] [address], [place], [address] Phone: [phone]   Fax:  [phone]  Physical Therapy Treatment  Patient Details  Name: [name] MRN: [id number] Date of Birth: 04-26-1950 No data recorded  Encounter Date: 02/18/2019  PT End of Session - 02/18/19 1334    Visit Number  15    Number of Visits  17    Date for PT Re-Evaluation  02/27/19    PT Start Time  0100    PT Stop Time  0143    PT Time Calculation (min)  43 min    Activity Tolerance  Patient tolerated treatment well;No increased pain    Behavior During Therapy  WFL for tasks assessed/performed       Past Medical History:  Diagnosis Date  . Arthritis    back, fingers with joint pain and swelling.  chronic back pain  . Chronic back pain    arthritis   . Diverticulosis   . Elevated cholesterol    takes Niacin daily and Simvastatin  . GERD (gastroesophageal reflux disease) 06/2004   non-specific gastritis on EGD 06/2004  . Headache(784.0)    occasionally  . History of colon polyps 2004, 2009   2004:adenomatous. 2009 hyperplastic.   Marland Kitchen History of hiatal hernia   . Hypertension    takes Tenoretic and Lisinopril daily  . Mucoid cyst of joint 08/2013   right index finger  . Osteopenia 01/2014   T score -1.1 FRAX 14%/0.5%. Stable from prior DEXA  . PONV (postoperative nausea and vomiting)   . Rotator cuff arthropathy    Left  . Seasonal allergies   . Stroke ([place]) 1998   x 2 - mild left-sided weakness  . Urge incontinence   . Uterine prolapse     Past Surgical History:  Procedure Laterality Date  . BACK SURGERY     x 2  . BELPHAROPTOSIS REPAIR Bilateral 12/14/2017  . CATARACT EXTRACTION    . CATARACT EXTRACTION Bilateral 10/2017  . COLONOSCOPY  2004, 2009, 2014  . FINGER SURGERY    . GUM SURGERY    . GYNECOLOGIC CRYOSURGERY    . HYSTEROSCOPY W/D&C  12/07/2010   with resection of endometrial polyp  . JOINT REPLACEMENT     . KNEE ARTHROSCOPY Left 2008  . lip biopsy     done at MD office Fri 11/22/13  . MASS EXCISION Right 08/15/2013   Procedure: RIGHT INDEX EXCISION MASS ;  Surgeon: Tennis Must, MD;  Location: [facility];  Service: Orthopedics;  Laterality: Right;  . NASAL SEPTUM SURGERY    . OOPHORECTOMY Right 2000  . REVERSE SHOULDER ARTHROPLASTY Left 12/11/2018  . REVERSE SHOULDER ARTHROPLASTY Left 12/11/2018   Procedure: LEFT REVERSE SHOULDER ARTHROPLASTY;  Surgeon: Meredith Pel, MD;  Location: [facility];  Service: Orthopedics;  Laterality: Left;  . SHOULDER ARTHROSCOPY WITH OPEN ROTATOR CUFF REPAIR AND DISTAL CLAVICLE ACROMINECTOMY Right 11/26/2013   Procedure: RIGHT SHOULDER ARTHROSCOPY WITH MINI OPEN ROTATOR CUFF REPAIR AND DISTAL CLAVICLE RESECTION, SUBACROMIAL DECOMPRESSION, POSSIBLE [facility] PATCH.;  Surgeon: [name], MD;  Location: [facility];  Service: Orthopedics;  Laterality: Right;  . TOTAL KNEE ARTHROPLASTY Right 03/21/2017  . TOTAL KNEE ARTHROPLASTY Right 03/21/2017   Procedure: RIGHT TOTAL KNEE ARTHROPLASTY;  Surgeon: [name], MD;  Location: [facility];  Service: Orthopedics;  Laterality: Right;  . TUBAL LIGATION      There were no vitals filed for this visit.  Subjective Assessment - 02/18/19 1307    Subjective  Patinet reports her motion is getting better, has not done her HEP over the weekend. 4/10 pain with movement    Pertinent History  Pt is a 68 year old female s/p L reverse total shoulder 12/11/18. Reports she was immobilized in her sling for 2 weeks. After immobilization has been using passive motion machine for flexion. Has been doing AAROM flexion/abd/ER, bicep, shrugs, and forward punches with 1# with HHPT over the past week. MD Marlou Sa advised patient for out of sling, AAROM, PROM with no lifting of more than 5# until next followup with him (mid Oct). Pt reports only 2/10 at rest, 6/10 with motion. Pain is at the medial upper arm and in the armpit. Pain is  worse during the evening after moving it all day. Patient lives with her husband and her 4 dogs, which she walks frequently and cares for. She is R handed.Pt denies N/V, B&B changes, unexplained weight fluctuation, saddle paresthesia, fever, night sweats, or unrelenting night pain at this time.Pt denies N/V, B&B changes, unexplained weight fluctuation, saddle paresthesia, fever, night sweats, or unrelenting night pain at this time.    Limitations  House hold activities;Lifting    How long can you sit comfortably?  unlimited    How long can you stand comfortably?  unlimited    How long can you walk comfortably?  unlimited    Diagnostic tests  Xrays    Patient Stated Goals  Being able to complete daily tasks      Ther-Ex - Wand AAROM flex, abd, ER @ 50d 15x each 3-5 sec hold; AAROM = PROM - AROM flex 2x 10 with cuing to prevent scapular protraction with decent carry over -LUE short short lever flexion "hand to crown of head) 2x 10 -LUE elbow flexion 2x10 c 3lb FW -BUE scapular elevation 5lb each hand 1x15  -BUE RTB row to neutral extension from 75 degrees, cues for scapular retraction at end 1x15      Manual -Left shoulder flexion P/ROM: 135 degrees -Left shoulder abduction P/ROM: 100 degrees -Left shoulder external rotation P/ROM  -MFR and sof ttissue mobilization to Left middle and posterior deltoid x 3 minutes (not concurrent with any other intervention) - Scar massage 2mns                    PT Education - 02/18/19 1333    Education Details  exercise form    Person(s) Educated  Patient    Methods  Explanation;Demonstration;Tactile cues;Verbal cues    Comprehension  Verbalized understanding;Returned demonstration;Verbal cues required;Tactile cues required       PT Short Term Goals - 02/13/19 1308      PT SHORT TERM GOAL #1   Title  Pt will be independent with HEP in order to improve strength and balance in order to improve function at home    Baseline   01/02/19    Time  4    Period  Weeks    Status  Achieved        PT Long Term Goals - 02/13/19 1308      PT LONG TERM GOAL #1   Title  Pt will decrease quick DASH score by at least 8% in order to demonstrate clinically significant reduction in disability.    Baseline  01/02/19 79.5%    Time  8    Period  Weeks    Status  On-going      PT LONG TERM  GOAL #2   Title  Patient will demonstrate full AROM in L shoulder in order to complete household ADLs    Baseline  01/02/19 74* Flexion, 58* ABDCT: 02/13/19: LUE shoulder fleixon 126*, abduction 95*    Time  8    Period  Weeks    Status  On-going      PT LONG TERM GOAL #3   Title  Pt will decrease worst pain as reported on NPRS by at least 3 points in order to demonstrate clinically significant reduction in pain.    Baseline  01/02/19 6/10 pain with movement    Time  8    Period  Weeks    Status  Achieved      PT LONG TERM GOAL #4   Title  Pt will demonstrate gross bilat shoulder and periscapular strength of 4/10 in order to safely lift for heavy household chores    Baseline  01/02/19 see eval    Time  8    Period  Weeks    Status  On-going            Plan - 02/18/19 1613    Clinical Impression Statement  PT continued therex progression for LUE strength and motion with good success. Patient is able to tolerate all interventions without increased pain, with good nted muscle fatigue.Patient with difficulty with scapulo-humeral rhythm, which she is able to somewhat correct with cuing. PT educated patient on postural correction for increased periscapular activation with verbalized understanding. PT will continue progression as able.    Personal Factors and Comorbidities  Age;Sex;Behavior Pattern;Comorbidity 1;Fitness    Comorbidities  HTN    Examination-Activity Limitations  Dressing;Carry;Reach Overhead;Lift    Examination-Participation Restrictions  Driving;Community Activity;Cleaning    Stability/Clinical Decision Making   Evolving/Moderate complexity    Clinical Decision Making  Moderate    Rehab Potential  Good    PT Frequency  2x / week    PT Duration  8 weeks    PT Treatment/Interventions  ADLs/Self Care Home Management;Electrical Stimulation;Therapeutic activities;Passive range of motion;Manual techniques;Patient/family education;Therapeutic exercise;Iontophoresis 55m/ml Dexamethasone;Moist Heat;Traction;Cryotherapy;Ultrasound;Functional mobility training;Neuromuscular re-education;Balance training;Dry needling;Joint Manipulations    PT Next Visit Plan  Continue to gently progress mobility and strength    PT Home Exercise Plan  AAROM cane, pulleys, grip strength, UT stretch    Consulted and Agree with Plan of Care  Patient       Patient will benefit from skilled therapeutic intervention in order to improve the following deficits and impairments:  Decreased mobility, Decreased endurance, Pain, Postural dysfunction, Impaired UE functional use, Impaired flexibility, Increased fascial restricitons, Decreased strength, Decreased coordination, Decreased range of motion, Improper body mechanics  Visit Diagnosis: Acute pain of left shoulder  Stiffness of left shoulder, not elsewhere classified  Muscle weakness (generalized)     Problem List Patient Active Problem List   Diagnosis Date Noted  . 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 Chronic left shoulder pain 08/23/2018  . Chronic diarrhea 12/07/2014  . Segmental colitis with rectal bleeding ([facility] 09/12/2014  . Routine general medical examination at a health care facility 06/05/2014  . Overweight 06/13/2011  . ABNORMAL CHEST XRAY 09/30/2007  . Hyperlipidemia 02/17/2007  . Essential hypertension 01/08/2007  . Osteoarthritis 01/08/2007   CShelton SilvasPT, DPT CShelton Silvas11/12/2018, 4:14 PM  [facility] [address] [place] [address]Phone: [phone]  Fax:   [phone] Name: Stacy BARCELOMRN: [id number] of  Birth: Jan 05, 1951

## 2019-02-20 ENCOUNTER — Ambulatory Visit: Payer: Medicare Other | Admitting: Physical Therapy

## 2019-02-20 ENCOUNTER — Other Ambulatory Visit: Payer: Self-pay

## 2019-02-20 ENCOUNTER — Encounter: Payer: Self-pay | Admitting: Physical Therapy

## 2019-02-20 DIAGNOSIS — M25512 Pain in left shoulder: Secondary | ICD-10-CM

## 2019-02-20 DIAGNOSIS — M6281 Muscle weakness (generalized): Secondary | ICD-10-CM | POA: Diagnosis not present

## 2019-02-20 DIAGNOSIS — M25612 Stiffness of left shoulder, not elsewhere classified: Secondary | ICD-10-CM | POA: Diagnosis not present

## 2019-02-20 DIAGNOSIS — M25572 Pain in left ankle and joints of left foot: Secondary | ICD-10-CM | POA: Diagnosis not present

## 2019-02-20 DIAGNOSIS — R262 Difficulty in walking, not elsewhere classified: Secondary | ICD-10-CM | POA: Diagnosis not present

## 2019-02-20 NOTE — Therapy (Signed)
[facility] [address] [place], [address] Phone: [phone]   Fax:  [phone]  Physical Therapy Treatment  Patient Details  Name: [name] MRN: [id number] Date of Birth: 08/16/1950 No data recorded  Encounter Date: 02/20/2019  PT End of Session - 02/20/19 1603    Visit Number  16    Number of Visits  17    Date for PT Re-Evaluation  02/27/19    PT Start Time  0305    PT Stop Time  0345    PT Time Calculation (min)  40 min    Activity Tolerance  Patient tolerated treatment well;No increased pain    Behavior During Therapy  WFL for tasks assessed/performed       Past Medical History:  Diagnosis Date  . Arthritis    back, fingers with joint pain and swelling.  chronic back pain  . Chronic back pain    arthritis   . Diverticulosis   . Elevated cholesterol    takes Niacin daily and Simvastatin  . GERD (gastroesophageal reflux disease) 06/2004   non-specific gastritis on EGD 06/2004  . Headache(784.0)    occasionally  . History of colon polyps 2004, 2009   2004:adenomatous. 2009 hyperplastic.   Marland Kitchen History of hiatal hernia   . Hypertension    takes Tenoretic and Lisinopril daily  . Mucoid cyst of joint 08/2013   right index finger  . Osteopenia 01/2014   T score -1.1 FRAX 14%/0.5%. Stable from prior DEXA  . PONV (postoperative nausea and vomiting)   . Rotator cuff arthropathy    Left  . Seasonal allergies   . Stroke ([place]) 1998   x 2 - mild left-sided weakness  . Urge incontinence   . Uterine prolapse     Past Surgical History:  Procedure Laterality Date  . BACK SURGERY     x 2  . BELPHAROPTOSIS REPAIR Bilateral 12/14/2017  . CATARACT EXTRACTION    . CATARACT EXTRACTION Bilateral 10/2017  . COLONOSCOPY  2004, 2009, 2014  . FINGER SURGERY    . GUM SURGERY    . GYNECOLOGIC CRYOSURGERY    . HYSTEROSCOPY W/D&C  12/07/2010   with resection of endometrial polyp  . JOINT  REPLACEMENT    . KNEE ARTHROSCOPY Left 2008  . lip biopsy     done at MD office Fri 11/22/13  . MASS EXCISION Right 08/15/2013   Procedure: RIGHT INDEX EXCISION MASS ;  Surgeon: Tennis Must, MD;  Location: [facility];  Service: Orthopedics;  Laterality: Right;  . NASAL SEPTUM SURGERY    . OOPHORECTOMY Right 2000  . REVERSE SHOULDER ARTHROPLASTY Left 12/11/2018  . REVERSE SHOULDER ARTHROPLASTY Left 12/11/2018   Procedure: LEFT REVERSE SHOULDER ARTHROPLASTY;  Surgeon: Meredith Pel, MD;  Location: [facility];  Service: Orthopedics;  Laterality: Left;  . SHOULDER ARTHROSCOPY WITH OPEN ROTATOR CUFF REPAIR AND DISTAL CLAVICLE ACROMINECTOMY Right 11/26/2013   Procedure: RIGHT SHOULDER ARTHROSCOPY WITH MINI OPEN ROTATOR CUFF REPAIR AND DISTAL CLAVICLE RESECTION, SUBACROMIAL DECOMPRESSION, POSSIBLE [facility] PATCH.;  Surgeon: [name], MD;  Location: [facility];  Service: Orthopedics;  Laterality: Right;  . TOTAL KNEE ARTHROPLASTY Right 03/21/2017  . TOTAL KNEE ARTHROPLASTY Right 03/21/2017   Procedure: RIGHT TOTAL KNEE ARTHROPLASTY;  Surgeon: [name], MD;  Location: [facility];  Service: Orthopedics;  Laterality: Right;  . TUBAL LIGATION      There were no vitals filed for this visit.  Subjective Assessment - 02/20/19 1509    Subjective  Patient reports she ust had a massageand that decreased her pain 2/10. feeling good overall.    Pertinent History  Pt is a 68 year old female s/p L reverse total shoulder 12/11/18. Reports she was immobilized in her sling for 2 weeks. After immobilization has been using passive motion machine for flexion. Has been doing AAROM flexion/abd/ER, bicep, shrugs, and forward punches with 1# with HHPT over the past week. MD Marlou Sa advised patient for out of sling, AAROM, PROM with no lifting of more than 5# until next followup with him (mid Oct). Pt reports only 2/10 at rest, 6/10 with motion. Pain is at the medial upper arm and in the armpit. Pain is worse  during the evening after moving it all day. Patient lives with her husband and her 4 dogs, which she walks frequently and cares for. She is R handed.Pt denies N/V, B&B changes, unexplained weight fluctuation, saddle paresthesia, fever, night sweats, or unrelenting night pain at this time.Pt denies N/V, B&B changes, unexplained weight fluctuation, saddle paresthesia, fever, night sweats, or unrelenting night pain at this time.    Limitations  House hold activities;Lifting    How long can you sit comfortably?  unlimited    How long can you stand comfortably?  unlimited    How long can you walk comfortably?  unlimited    Diagnostic tests  Xrays    Patient Stated Goals  Being able to complete daily tasks       Ther-Ex - Pulleys AAROM flex 31mns abd 226ms almost full range flex; approx 100d abd -UE ranger clockwise and counterclockwise 2x 10 each direction - LUE AROM abd to 90d x15 - LUE AROM flex to 90d x15 -LUE short short lever scaption "hand to crown of head) 2x 10 -LUE elbow flexion 2x10 c 3lb mincuing for eccentric control -BUE scapular elevation 5lb each hand 2x 10 good scapular motion -BUE RTB row to neutral extension from 75 degrees, cues for scapular retraction at end 2x 10 - BUE RTB tricep ext with good carry over of proper technique following demo    Manual -Left shoulder flexion P/ROM: 150 degrees -Left shoulder abduction P/ROM: 100 degrees -Left shoulder external rotation P/ROM -Left shoulder IR PROM to 50d STM to UT and deltoid goup                          PT Education - 02/20/19 1603    Education Details  Exercise form    Person(s) Educated  Patient    Methods  Explanation;Demonstration;Verbal cues    Comprehension  Verbalized understanding;Returned demonstration;Verbal cues required       PT Short Term Goals - 02/13/19 1308      PT SHORT TERM GOAL #1   Title  Pt will be independent with HEP in order to improve strength and balance in order  to improve function at home    Baseline  01/02/19    Time  4    Period  Weeks    Status  Achieved        PT Long Term Goals - 02/13/19 1308      PT LONG TERM GOAL #1   Title  Pt will decrease quick DASH score by at least 8% in order to demonstrate clinically significant reduction in disability.    Baseline  01/02/19 79.5%    Time  8    Period  Weeks  Status  On-going      PT LONG TERM GOAL #2   Title  Patient will demonstrate full AROM in L shoulder in order to complete household ADLs    Baseline  01/02/19 74* Flexion, 58* ABDCT: 02/13/19: LUE shoulder fleixon 126*, abduction 95*    Time  8    Period  Weeks    Status  On-going      PT LONG TERM GOAL #3   Title  Pt will decrease worst pain as reported on NPRS by at least 3 points in order to demonstrate clinically significant reduction in pain.    Baseline  01/02/19 6/10 pain with movement    Time  8    Period  Weeks    Status  Achieved      PT LONG TERM GOAL #4   Title  Pt will demonstrate gross bilat shoulder and periscapular strength of 4/10 in order to safely lift for heavy household chores    Baseline  01/02/19 see eval    Time  8    Period  Weeks    Status  On-going            Plan - 02/20/19 1604    Clinical Impression Statement  PT continued progression for LUE strength and ROM with good success. Patient is increasing ROM between sessions. Patient reports no increased pain throughout session, good noted muscle fatigue. PT will continue progressionas able.    Personal Factors and Comorbidities  Age;Sex;Behavior Pattern;Comorbidity 1;Fitness    Comorbidities  HTN    Examination-Activity Limitations  Dressing;Carry;Reach Overhead;Lift    Examination-Participation Restrictions  Driving;Community Activity;Cleaning    Stability/Clinical Decision Making  Evolving/Moderate complexity    Clinical Decision Making  Moderate    Rehab Potential  Good    PT Frequency  2x / week    PT Duration  8 weeks    PT  Treatment/Interventions  ADLs/Self Care Home Management;Electrical Stimulation;Therapeutic activities;Passive range of motion;Manual techniques;Patient/family education;Therapeutic exercise;Iontophoresis 87m/ml Dexamethasone;Moist Heat;Traction;Cryotherapy;Ultrasound;Functional mobility training;Neuromuscular re-education;Balance training;Dry needling;Joint Manipulations    PT Next Visit Plan  Continue to gently progress mobility and strength    PT Home Exercise Plan  AAROM cane, pulleys, grip strength, UT stretch    Consulted and Agree with Plan of Care  Patient       Patient will benefit from skilled therapeutic intervention in order to improve the following deficits and impairments:  Decreased mobility, Decreased endurance, Pain, Postural dysfunction, Impaired UE functional use, Impaired flexibility, Increased fascial restricitons, Decreased strength, Decreased coordination, Decreased range of motion, Improper body mechanics  Visit Diagnosis: Stiffness of left shoulder, not elsewhere classified  Acute pain of left shoulder  Muscle weakness (generalized)     Problem List Patient Active Problem List   Diagnosis Date Noted  . 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 Chronic left shoulder pain 08/23/2018  . Chronic diarrhea 12/07/2014  . [place] 09/12/2014  . Routine general medical examination at a health care facility 06/05/2014  . Overweight 06/13/2011  . ABNORMAL CHEST XRAY 09/30/2007  . Hyperlipidemia 02/17/2007  . Essential hypertension 01/08/2007  . Osteoarthritis 01/08/2007   CShelton SilvasPT, DPT CShelton Silvas11/02/2019, 4:22 PM  [facility] [address] [place] [address]Phone: [phone]  Fax:  [phone] Name: Stacy PAYANOMRN: [id number] of Birth: 3March 03, 1952

## 2019-02-25 ENCOUNTER — Other Ambulatory Visit: Payer: Self-pay

## 2019-02-25 ENCOUNTER — Ambulatory Visit: Payer: Medicare Other | Admitting: Physical Therapy

## 2019-02-25 ENCOUNTER — Encounter: Payer: Self-pay | Admitting: Physical Therapy

## 2019-02-25 DIAGNOSIS — M25512 Pain in left shoulder: Secondary | ICD-10-CM

## 2019-02-25 DIAGNOSIS — M25612 Stiffness of left shoulder, not elsewhere classified: Secondary | ICD-10-CM

## 2019-02-25 DIAGNOSIS — R262 Difficulty in walking, not elsewhere classified: Secondary | ICD-10-CM | POA: Diagnosis not present

## 2019-02-25 DIAGNOSIS — M25572 Pain in left ankle and joints of left foot: Secondary | ICD-10-CM | POA: Diagnosis not present

## 2019-02-25 DIAGNOSIS — M6281 Muscle weakness (generalized): Secondary | ICD-10-CM | POA: Diagnosis not present

## 2019-02-25 NOTE — Therapy (Addendum)
[facility] [address], [place], [address] Phone: [phone]   Fax:  [phone]  Physical Therapy Treatment/Progress Report Reporting Period: 02-04-19 -02/25/19  Patient Details  Name: [name] MRN: [id number] Date of Birth: 01/04/1951 No data recorded  Encounter Date: 02/25/2019  PT End of Session - 02/25/19 1312    Visit Number  17    Number of Visits  29    Date for PT Re-Evaluation  04/08/19    PT Start Time  0100    PT Stop Time  0145    PT Time Calculation (min)  45 min    Activity Tolerance  Patient tolerated treatment well;No increased pain    Behavior During Therapy  WFL for tasks assessed/performed       Past Medical History:  Diagnosis Date  . Arthritis    back, fingers with joint pain and swelling.  chronic back pain  . Chronic back pain    arthritis   . Diverticulosis   . Elevated cholesterol    takes Niacin daily and Simvastatin  . GERD (gastroesophageal reflux disease) 06/2004   non-specific gastritis on EGD 06/2004  . Headache(784.0)    occasionally  . History of colon polyps 2004, 2009   2004:adenomatous. 2009 hyperplastic.   Marland Kitchen History of hiatal hernia   . Hypertension    takes Tenoretic and Lisinopril daily  . Mucoid cyst of joint 08/2013   right index finger  . Osteopenia 01/2014   T score -1.1 FRAX 14%/0.5%. Stable from prior DEXA  . PONV (postoperative nausea and vomiting)   . Rotator cuff arthropathy    Left  . Seasonal allergies   . Stroke ([place]) 1998   x 2 - mild left-sided weakness  . Urge incontinence   . Uterine prolapse     Past Surgical History:  Procedure Laterality Date  . BACK SURGERY     x 2  . BELPHAROPTOSIS REPAIR Bilateral 12/14/2017  . CATARACT EXTRACTION    . CATARACT EXTRACTION Bilateral 10/2017  . COLONOSCOPY  2004, 2009, 2014  . FINGER SURGERY    . GUM SURGERY    . GYNECOLOGIC CRYOSURGERY    . HYSTEROSCOPY W/D&C  12/07/2010    with resection of endometrial polyp  . JOINT REPLACEMENT    . KNEE ARTHROSCOPY Left 2008  . lip biopsy     done at MD office Fri 11/22/13  . MASS EXCISION Right 08/15/2013   Procedure: RIGHT INDEX EXCISION MASS ;  Surgeon: Tennis Must, MD;  Location: [facility];  Service: Orthopedics;  Laterality: Right;  . NASAL SEPTUM SURGERY    . OOPHORECTOMY Right 2000  . REVERSE SHOULDER ARTHROPLASTY Left 12/11/2018  . REVERSE SHOULDER ARTHROPLASTY Left 12/11/2018   Procedure: LEFT REVERSE SHOULDER ARTHROPLASTY;  Surgeon: Meredith Pel, MD;  Location: [facility];  Service: Orthopedics;  Laterality: Left;  . SHOULDER ARTHROSCOPY WITH OPEN ROTATOR CUFF REPAIR AND DISTAL CLAVICLE ACROMINECTOMY Right 11/26/2013   Procedure: RIGHT SHOULDER ARTHROSCOPY WITH MINI OPEN ROTATOR CUFF REPAIR AND DISTAL CLAVICLE RESECTION, SUBACROMIAL DECOMPRESSION, POSSIBLE [facility] PATCH.;  Surgeon: [name], MD;  Location: [facility];  Service: Orthopedics;  Laterality: Right;  . TOTAL KNEE ARTHROPLASTY Right 03/21/2017  . TOTAL KNEE ARTHROPLASTY Right 03/21/2017   Procedure: RIGHT TOTAL KNEE ARTHROPLASTY;  Surgeon: [name], MD;  Location: [facility];  Service: Orthopedics;  Laterality: Right;  . TUBAL LIGATION      There were no  vitals filed for this visit.  Subjective Assessment - 02/25/19 1306    Subjective  Only 1-2/10 pain today. Mild soreness following last session. Thinks motion is improving.    Pertinent History  Pt is a 68 year old female s/p L reverse total shoulder 12/11/18. Reports she was immobilized in her sling for 2 weeks. After immobilization has been using passive motion machine for flexion. Has been doing AAROM flexion/abd/ER, bicep, shrugs, and forward punches with 1# with HHPT over the past week. MD Marlou Sa advised patient for out of sling, AAROM, PROM with no lifting of more than 5# until next followup with him (mid Oct). Pt reports only 2/10 at rest, 6/10 with motion. Pain is at the medial  upper arm and in the armpit. Pain is worse during the evening after moving it all day. Patient lives with her husband and her 4 dogs, which she walks frequently and cares for. She is R handed.Pt denies N/V, B&B changes, unexplained weight fluctuation, saddle paresthesia, fever, night sweats, or unrelenting night pain at this time.Pt denies N/V, B&B changes, unexplained weight fluctuation, saddle paresthesia, fever, night sweats, or unrelenting night pain at this time.    How long can you sit comfortably?  unlimited    How long can you stand comfortably?  unlimited    How long can you walk comfortably?  unlimited    Diagnostic tests  Xrays    Patient Stated Goals  Being able to complete daily tasks          Ther-Ex - Arm bike L1 resistance 42mn forward 219m backward for AAROM rotation and scapular protraction/retraction - TRX walkouts abd 66m63mflex 66mi43mith 5sec holds cuing for max available ROM with good carry over -UE ranger clockwise and counterclockwise 2x 10 each direction - A set of the following completed with min cuing for technique with good carry over. Printout given of these with rep/set/frequency range with verbalized understanding from patient on all education provided  Seated Chest Press - 10 reps - 3 sets - 1x daily - 3-4x weekly  Seated Shoulder Flexion with Dumbbells - 10 reps - 3 sets - 1x daily - 3-4x weekl Seated Single Arm Shoulder Horizontal Abduction and Adduction - 10 reps - 3 sets - 1x daily - 3-4x weekly  Standing Row with Anchored Resistance - 10 reps - 3 sets - 1x daily - 3-4x weekly Standing Isometric Shoulder Internal Rotation at Doorway - 10 reps - 10sec hold - 1x daily - 7x weekly  Standing Isometric Shoulder External Rotation with Doorway and Towel Roll - 10 reps - 10sec hold - 1x daily - 7x weekly  Seated Shoulder Flexion AAROM with Pulley Behind - 20 reps - 3-5sec hold - 1x daily - 7x weekly Seated  Shoulder Abduction AAROM with Pulley Behind - 20 reps -  3-5sec hold - 1x daily - 7x weekly                        PT Education - 02/25/19 1311    Education Details  Exercise form    Person(s) Educated  Patient    Methods  Explanation;Demonstration;Verbal cues    Comprehension  Verbalized understanding;Returned demonstration;Verbal cues required       PT Short Term Goals - 02/13/19 1308      PT SHORT TERM GOAL #1   Title  Pt will be independent with HEP in order to improve strength and balance in order to improve function at home  Baseline  01/02/19    Time  4    Period  Weeks    Status  Achieved        PT Long Term Goals - 02/25/19 1313      PT LONG TERM GOAL #1   Title  Pt will decrease quick DASH score by at least 8% in order to demonstrate clinically significant reduction in disability.    Baseline  02/25/19 56.8%    Time  8    Period  Weeks    Status  Achieved      PT LONG TERM GOAL #2   Title  Patient will demonstrate full AROM in L shoulder in order to complete household ADLs    Baseline  01/02/19 74* Flexion, 58* ABDCT: 02/13/19: LUE shoulder fleixon 126*, abduction 95*; 02/25/19 120* Flexion, 95* ABDCT, IR  L PSIS, ER C7    Time  8    Period  Weeks    Status  On-going      PT LONG TERM GOAL #3   Title  Pt will decrease worst pain as reported on NPRS by at least 3 points in order to demonstrate clinically significant reduction in pain.    Baseline  02/25/19 2/10 pain with movement    Time  8    Period  Weeks    Status  Achieved      PT LONG TERM GOAL #4   Title  Pt will demonstrate gross bilat shoulder and periscapular strength of 4/10 in order to safely lift for heavy household chores    Baseline  02/25/19 R/L  shoulder flex: 4+/4 abd: 4+/4+ IR: 3+/4 ER: 3+/4 ext: Periscap Y 3+/3- T 4/4-  I 4+/4- 01/02/19 see eval    Time  8    Period  Weeks            Plan - 02/25/19 1704    Clinical Impression Statement  PT reassessed goals this session. Patient is making good progress with continued  deficts in AROM and strength. Largest strength and motion in IR and ER, d/t surgerical progression for these two motions being behind flex/abd. Patient with biggest activity limitations in behind the back reaching to bathe, and overhead reaching/lifting for ADLs. PT will continue progression to increase shoulder motion, shoulder and periscapular strength, and motor control as able.    Personal Factors and Comorbidities  Age;Sex;Behavior Pattern;Comorbidity 1;Fitness    Comorbidities  HTN    Examination-Activity Limitations  Dressing;Carry;Reach Overhead;Lift    Examination-Participation Restrictions  Driving;Community Activity;Cleaning    Stability/Clinical Decision Making  Evolving/Moderate complexity    Clinical Decision Making  Moderate    Rehab Potential  Good    PT Frequency  2x / week    PT Duration  8 weeks    PT Treatment/Interventions  ADLs/Self Care Home Management;Electrical Stimulation;Therapeutic activities;Passive range of motion;Manual techniques;Patient/family education;Therapeutic exercise;Iontophoresis 64m/ml Dexamethasone;Moist Heat;Traction;Cryotherapy;Ultrasound;Functional mobility training;Neuromuscular re-education;Balance training;Dry needling;Joint Manipulations    PT Next Visit Plan  Continue to gently progress mobility and strength    PT Home Exercise Plan  [id number]    Consulted and Agree with Plan of Care  Patient       Patient will benefit from skilled therapeutic intervention in order to improve the following deficits and impairments:  Decreased mobility, Decreased endurance, Pain, Postural dysfunction, Impaired UE functional use, Impaired flexibility, Increased fascial restricitons, Decreased strength, Decreased coordination, Decreased range of motion, Improper body mechanics  Visit Diagnosis: Stiffness of left shoulder, not elsewhere classified  Acute pain of  left shoulder  Muscle weakness (generalized)  Difficulty in walking, not elsewhere  classified     Problem List Patient Active Problem List   Diagnosis Date Noted  . 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 Chronic left shoulder pain 08/23/2018  . Chronic diarrhea 12/07/2014  . Segmental colitis with rectal bleeding ([place]) 09/12/2014  . Routine general medical examination at a health care facility 06/05/2014  . Overweight 06/13/2011  . ABNORMAL CHEST XRAY 09/30/2007  . Hyperlipidemia 02/17/2007  . Essential hypertension 01/08/2007  . Osteoarthritis 01/08/2007   Shelton Silvas PT, DPT Shelton Silvas 02/25/2019, 5:08 PM  [facility] [address], [place], [address] Phone: [phone]   Fax:  [phone]  Name: NORLENE LANES MRN: [id number] Date of Birth: [birth year]

## 2019-02-27 ENCOUNTER — Ambulatory Visit: Payer: Medicare Other | Admitting: Physical Therapy

## 2019-02-27 ENCOUNTER — Other Ambulatory Visit: Payer: Self-pay

## 2019-02-27 ENCOUNTER — Encounter: Payer: Self-pay | Admitting: Physical Therapy

## 2019-02-27 DIAGNOSIS — M25612 Stiffness of left shoulder, not elsewhere classified: Secondary | ICD-10-CM | POA: Diagnosis not present

## 2019-02-27 DIAGNOSIS — M6281 Muscle weakness (generalized): Secondary | ICD-10-CM | POA: Diagnosis not present

## 2019-02-27 DIAGNOSIS — M25512 Pain in left shoulder: Secondary | ICD-10-CM

## 2019-02-27 DIAGNOSIS — R262 Difficulty in walking, not elsewhere classified: Secondary | ICD-10-CM | POA: Diagnosis not present

## 2019-02-27 DIAGNOSIS — M25572 Pain in left ankle and joints of left foot: Secondary | ICD-10-CM | POA: Diagnosis not present

## 2019-02-27 NOTE — Therapy (Signed)
[facility] [address] [place], [address] Phone: [phone]   Fax:  [phone]  Physical Therapy Treatment  Patient Details  Name: [name] MRN: [id number] Date of Birth: April 28, 1950 No data recorded  Encounter Date: 02/27/2019  PT End of Session - 02/27/19 1308    Visit Number  18    Number of Visits  29    Date for PT Re-Evaluation  04/08/19    PT Start Time  0100    PT Stop Time  0145    PT Time Calculation (min)  45 min    Activity Tolerance  Patient tolerated treatment well;No increased pain    Behavior During Therapy  WFL for tasks assessed/performed       Past Medical History:  Diagnosis Date  . Arthritis    back, fingers with joint pain and swelling.  chronic back pain  . Chronic back pain    arthritis   . Diverticulosis   . Elevated cholesterol    takes Niacin daily and Simvastatin  . GERD (gastroesophageal reflux disease) 06/2004   non-specific gastritis on EGD 06/2004  . Headache(784.0)    occasionally  . History of colon polyps 2004, 2009   2004:adenomatous. 2009 hyperplastic.   Marland Kitchen History of hiatal hernia   . Hypertension    takes Tenoretic and Lisinopril daily  . Mucoid cyst of joint 08/2013   right index finger  . Osteopenia 01/2014   T score -1.1 FRAX 14%/0.5%. Stable from prior DEXA  . PONV (postoperative nausea and vomiting)   . Rotator cuff arthropathy    Left  . Seasonal allergies   . Stroke ([place]) 1998   x 2 - mild left-sided weakness  . Urge incontinence   . Uterine prolapse     Past Surgical History:  Procedure Laterality Date  . BACK SURGERY     x 2  . BELPHAROPTOSIS REPAIR Bilateral 12/14/2017  . CATARACT EXTRACTION    . CATARACT EXTRACTION Bilateral 10/2017  . COLONOSCOPY  2004, 2009, 2014  . FINGER SURGERY    . GUM SURGERY    . GYNECOLOGIC CRYOSURGERY    . HYSTEROSCOPY W/D&C  12/07/2010   with resection of endometrial polyp  . JOINT  REPLACEMENT    . KNEE ARTHROSCOPY Left 2008  . lip biopsy     done at MD office Fri 11/22/13  . MASS EXCISION Right 08/15/2013   Procedure: RIGHT INDEX EXCISION MASS ;  Surgeon: Tennis Must, MD;  Location: [facility];  Service: Orthopedics;  Laterality: Right;  . NASAL SEPTUM SURGERY    . OOPHORECTOMY Right 2000  . REVERSE SHOULDER ARTHROPLASTY Left 12/11/2018  . REVERSE SHOULDER ARTHROPLASTY Left 12/11/2018   Procedure: LEFT REVERSE SHOULDER ARTHROPLASTY;  Surgeon: Meredith Pel, MD;  Location: [facility];  Service: Orthopedics;  Laterality: Left;  . SHOULDER ARTHROSCOPY WITH OPEN ROTATOR CUFF REPAIR AND DISTAL CLAVICLE ACROMINECTOMY Right 11/26/2013   Procedure: RIGHT SHOULDER ARTHROSCOPY WITH MINI OPEN ROTATOR CUFF REPAIR AND DISTAL CLAVICLE RESECTION, SUBACROMIAL DECOMPRESSION, POSSIBLE [facility] PATCH.;  Surgeon: [name], MD;  Location: [facility];  Service: Orthopedics;  Laterality: Right;  . TOTAL KNEE ARTHROPLASTY Right 03/21/2017  . TOTAL KNEE ARTHROPLASTY Right 03/21/2017   Procedure: RIGHT TOTAL KNEE ARTHROPLASTY;  Surgeon: [name], MD;  Location: [facility];  Service: Orthopedics;  Laterality: Right;  . TUBAL LIGATION      There were no vitals filed for this visit.  Subjective Assessment - 02/27/19 1306    Subjective  1/10 pain today with some bilat neck pain. Reports her new HEP is going well.    Pertinent History  Pt is a 68 year old female s/p L reverse total shoulder 12/11/18. Reports she was immobilized in her sling for 2 weeks. After immobilization has been using passive motion machine for flexion. Has been doing AAROM flexion/abd/ER, bicep, shrugs, and forward punches with 1# with HHPT over the past week. MD Marlou Sa advised patient for out of sling, AAROM, PROM with no lifting of more than 5# until next followup with him (mid Oct). Pt reports only 2/10 at rest, 6/10 with motion. Pain is at the medial upper arm and in the armpit. Pain is worse during the  evening after moving it all day. Patient lives with her husband and her 4 dogs, which she walks frequently and cares for. She is R handed.Pt denies N/V, B&B changes, unexplained weight fluctuation, saddle paresthesia, fever, night sweats, or unrelenting night pain at this time.Pt denies N/V, B&B changes, unexplained weight fluctuation, saddle paresthesia, fever, night sweats, or unrelenting night pain at this time.    Limitations  House hold activities;Lifting    How long can you sit comfortably?  unlimited    How long can you stand comfortably?  unlimited    How long can you walk comfortably?  unlimited    Diagnostic tests  Xrays    Patient Stated Goals  Being able to complete daily tasks       Ther-Ex -Arm bike L1 resistance 86mn forward 250m backward for AAROM rotation and scapular protraction/retraction - TRX walkouts abd 42m44mflex 42mi342mith 5sec holds cuing for max available ROM with good carry over -UE ranger clockwise and counterclockwise 2x 10 each direction - Seated chest press AAROM with wand x12 - Seated chest press 1# DB BUE 2x 10  - Standing shoulder flex 1# DB to 90d 2x 10 cuing to prevent shoulder protraction with scapular activation with good carry over - Standing shoulder abd 1# DB to 90d 2x 10  - Standing row 5# (rowing to neutral/slight ext) 2x 10 with cuing for control without protraction with return with good carry over -  Shoulder ext to neutral/slight ext RTB 2x 10  - ER and IR isometric 5x 10sec hold each with cuing for set up with good carry over of proper technique                        PT Education - 02/27/19 1308    Education Details  Exercise form    Person(s) Educated  Patient    Methods  Explanation;Demonstration;Verbal cues    Comprehension  Verbalized understanding;Returned demonstration;Verbal cues required       PT Short Term Goals - 02/13/19 1308      PT SHORT TERM GOAL #1   Title  Pt will be independent with HEP in order to  improve strength and balance in order to improve function at home    Baseline  01/02/19    Time  4    Period  Weeks    Status  Achieved        PT Long Term Goals - 02/25/19 1313      PT LONG TERM GOAL #1   Title  Pt will decrease quick DASH score by at least 8% in order to demonstrate clinically significant reduction in disability.    Baseline  02/25/19 56.8%  Time  8    Period  Weeks    Status  Achieved      PT LONG TERM GOAL #2   Title  Patient will demonstrate full AROM in L shoulder in order to complete household ADLs    Baseline  01/02/19 74* Flexion, 58* ABDCT: 02/13/19: LUE shoulder fleixon 126*, abduction 95*; 02/25/19 120* Flexion, 95* ABDCT, IR  L PSIS, ER C7    Time  8    Period  Weeks    Status  On-going      PT LONG TERM GOAL #3   Title  Pt will decrease worst pain as reported on NPRS by at least 3 points in order to demonstrate clinically significant reduction in pain.    Baseline  02/25/19 2/10 pain with movement    Time  8    Period  Weeks    Status  Achieved      PT LONG TERM GOAL #4   Title  Pt will demonstrate gross bilat shoulder and periscapular strength of 4/10 in order to safely lift for heavy household chores    Baseline  02/25/19 R/L  shoulder flex: 4+/4 abd: 4+/4+ IR: 3+/4 ER: 3+/4 ext: Periscap Y 3+/3- T 4/4-  I 4+/4- 01/02/19 see eval    Time  8    Period  Weeks            Plan - 02/27/19 1337    Clinical Impression Statement  PT continued progression for increased shoulder ROM, strength, and motor control with good success. Patient is able to comply with all cuing for proper technique with good success. PT will continue progression as able.    Personal Factors and Comorbidities  Age;Sex;Behavior Pattern;Comorbidity 1;Fitness    Comorbidities  HTN    Examination-Activity Limitations  Dressing;Carry;Reach Overhead;Lift    Examination-Participation Restrictions  Driving;Community Activity;Cleaning    Stability/Clinical Decision Making   Evolving/Moderate complexity    Clinical Decision Making  Moderate    Rehab Potential  Good    PT Frequency  2x / week    PT Duration  8 weeks    PT Treatment/Interventions  ADLs/Self Care Home Management;Electrical Stimulation;Therapeutic activities;Passive range of motion;Manual techniques;Patient/family education;Therapeutic exercise;Iontophoresis 45m/ml Dexamethasone;Moist Heat;Traction;Cryotherapy;Ultrasound;Functional mobility training;Neuromuscular re-education;Balance training;Dry needling;Joint Manipulations    PT Next Visit Plan  Continue to gently progress mobility and strength    PT Home Exercise Plan  [id number]    Consulted and Agree with Plan of Care  Patient       Patient will benefit from skilled therapeutic intervention in order to improve the following deficits and impairments:  Decreased mobility, Decreased endurance, Pain, Postural dysfunction, Impaired UE functional use, Impaired flexibility, Increased fascial restricitons, Decreased strength, Decreased coordination, Decreased range of motion, Improper body mechanics  Visit Diagnosis: Stiffness of left shoulder, not elsewhere classified  Acute pain of left shoulder  Muscle weakness (generalized)     Problem List Patient Active Problem List   Diagnosis Date Noted  . 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 Chronic left shoulder pain 08/23/2018  . Chronic diarrhea 12/07/2014  . Segmental colitis with rectal bleeding ([place] 09/12/2014  . Routine general medical examination at a health care facility 06/05/2014  . Overweight 06/13/2011  . ABNORMAL CHEST XRAY 09/30/2007  . Hyperlipidemia 02/17/2007  . Essential hypertension 01/08/2007  . Osteoarthritis 01/08/2007   CShelton SilvasPT, DPT CShelton Silvas11/18/2020, 1:43 PM  [facility] [address] [place] [address]  Phone: [phone]   Fax:  [phone]  Name: EASTER KENNEBREW MRN: [id number] Date of Birth: 1950/10/04

## 2019-03-03 ENCOUNTER — Other Ambulatory Visit: Payer: Self-pay | Admitting: Internal Medicine

## 2019-03-04 ENCOUNTER — Other Ambulatory Visit: Payer: Self-pay

## 2019-03-04 ENCOUNTER — Ambulatory Visit: Payer: Medicare Other | Admitting: Physical Therapy

## 2019-03-04 ENCOUNTER — Encounter: Payer: Self-pay | Admitting: Physical Therapy

## 2019-03-04 DIAGNOSIS — M6281 Muscle weakness (generalized): Secondary | ICD-10-CM | POA: Diagnosis not present

## 2019-03-04 DIAGNOSIS — M25572 Pain in left ankle and joints of left foot: Secondary | ICD-10-CM

## 2019-03-04 DIAGNOSIS — M25512 Pain in left shoulder: Secondary | ICD-10-CM

## 2019-03-04 DIAGNOSIS — R262 Difficulty in walking, not elsewhere classified: Secondary | ICD-10-CM | POA: Diagnosis not present

## 2019-03-04 DIAGNOSIS — M25612 Stiffness of left shoulder, not elsewhere classified: Secondary | ICD-10-CM

## 2019-03-04 NOTE — Therapy (Signed)
[facility] [address] [place], [address] Phone: [phone]   Fax:  [phone]  Physical Therapy Treatment  Patient Details  Name: [name] MRN: [id number] Date of Birth: 09/12/1950 No data recorded  Encounter Date: 03/04/2019  PT End of Session - 03/04/19 1626    Visit Number  19    Number of Visits  29    Date for PT Re-Evaluation  04/08/19    PT Start Time  0420    PT Stop Time  0500    PT Time Calculation (min)  40 min    Activity Tolerance  Patient tolerated treatment well;No increased pain    Behavior During Therapy  WFL for tasks assessed/performed       Past Medical History:  Diagnosis Date  . Arthritis    back, fingers with joint pain and swelling.  chronic back pain  . Chronic back pain    arthritis   . Diverticulosis   . Elevated cholesterol    takes Niacin daily and Simvastatin  . GERD (gastroesophageal reflux disease) 06/2004   non-specific gastritis on EGD 06/2004  . Headache(784.0)    occasionally  . History of colon polyps 2004, 2009   2004:adenomatous. 2009 hyperplastic.   Marland Kitchen History of hiatal hernia   . Hypertension    takes Tenoretic and Lisinopril daily  . Mucoid cyst of joint 08/2013   right index finger  . Osteopenia 01/2014   T score -1.1 FRAX 14%/0.5%. Stable from prior DEXA  . PONV (postoperative nausea and vomiting)   . Rotator cuff arthropathy    Left  . Seasonal allergies   . Stroke ([place]) 1998   x 2 - mild left-sided weakness  . Urge incontinence   . Uterine prolapse     Past Surgical History:  Procedure Laterality Date  . BACK SURGERY     x 2  . BELPHAROPTOSIS REPAIR Bilateral 12/14/2017  . CATARACT EXTRACTION    . CATARACT EXTRACTION Bilateral 10/2017  . COLONOSCOPY  2004, 2009, 2014  . FINGER SURGERY    . GUM SURGERY    . GYNECOLOGIC CRYOSURGERY    . HYSTEROSCOPY W/D&C  12/07/2010   with resection of endometrial polyp  . JOINT  REPLACEMENT    . KNEE ARTHROSCOPY Left 2008  . lip biopsy     done at MD office Fri 11/22/13  . MASS EXCISION Right 08/15/2013   Procedure: RIGHT INDEX EXCISION MASS ;  Surgeon: Tennis Must, MD;  Location: [facility];  Service: Orthopedics;  Laterality: Right;  . NASAL SEPTUM SURGERY    . OOPHORECTOMY Right 2000  . REVERSE SHOULDER ARTHROPLASTY Left 12/11/2018  . REVERSE SHOULDER ARTHROPLASTY Left 12/11/2018   Procedure: LEFT REVERSE SHOULDER ARTHROPLASTY;  Surgeon: Meredith Pel, MD;  Location: [facility];  Service: Orthopedics;  Laterality: Left;  . SHOULDER ARTHROSCOPY WITH OPEN ROTATOR CUFF REPAIR AND DISTAL CLAVICLE ACROMINECTOMY Right 11/26/2013   Procedure: RIGHT SHOULDER ARTHROSCOPY WITH MINI OPEN ROTATOR CUFF REPAIR AND DISTAL CLAVICLE RESECTION, SUBACROMIAL DECOMPRESSION, POSSIBLE [facility] PATCH.;  Surgeon: [name], MD;  Location: [facility];  Service: Orthopedics;  Laterality: Right;  . TOTAL KNEE ARTHROPLASTY Right 03/21/2017  . TOTAL KNEE ARTHROPLASTY Right 03/21/2017   Procedure: RIGHT TOTAL KNEE ARTHROPLASTY;  Surgeon: [name], MD;  Location: [facility];  Service: Orthopedics;  Laterality: Right;  . TUBAL LIGATION      There were no vitals filed for this visit.  Subjective Assessment - 03/04/19 1622    Subjective  Reports some L sided neck pain that was relieved by massage therapy today for the most part. 1/10 shoulder pain today. Compliance with HEP    Pertinent History  Pt is a 68 year old female s/p L reverse total shoulder 12/11/18. Reports she was immobilized in her sling for 2 weeks. After immobilization has been using passive motion machine for flexion. Has been doing AAROM flexion/abd/ER, bicep, shrugs, and forward punches with 1# with HHPT over the past week. MD Marlou Sa advised patient for out of sling, AAROM, PROM with no lifting of more than 5# until next followup with him (mid Oct). Pt reports only 2/10 at rest, 6/10 with motion. Pain is at the  medial upper arm and in the armpit. Pain is worse during the evening after moving it all day. Patient lives with her husband and her 4 dogs, which she walks frequently and cares for. She is R handed.Pt denies N/V, B&B changes, unexplained weight fluctuation, saddle paresthesia, fever, night sweats, or unrelenting night pain at this time.Pt denies N/V, B&B changes, unexplained weight fluctuation, saddle paresthesia, fever, night sweats, or unrelenting night pain at this time.    Limitations  House hold activities;Lifting    How long can you sit comfortably?  unlimited    How long can you stand comfortably?  unlimited    How long can you walk comfortably?  unlimited    Diagnostic tests  Xrays    Patient Stated Goals  Being able to complete daily tasks    Pain Onset  1 to 4 weeks ago       Ther-Ex  - TRX walkouts abd 53mn flex 179m with 5sec holds cuing for max available ROM with good carry over - UE ranger clockwise and counterclockwise 2x 10 each direction - Seated chest press 1# DB BUE x 10; 2# x10 with increased fatigue with 2# cuing to prevent excessive scapular protraction - Standing shoulder flex 2# DB to 90d 2x 10 cuing to prevent shoulder protraction with scapular activation with better carry over - Standing shoulder abd 2# DB to 90d 2x 10  - Standing row 5# (rowing to neutral/slight ext) 2x 10 with cuing to prevent torso rotation with good carry over - bilat Lat pulldown 10# 2x 10 with good carry over of proper technique following demo - Gentle pain free AROM ER-IR in sidelying 2x 12 belly to neutral                          PT Education - 03/04/19 1624    Education Details  Exercise form    Person(s) Educated  Patient    Methods  Explanation;Demonstration;Verbal cues    Comprehension  Verbalized understanding;Returned demonstration;Verbal cues required       PT Short Term Goals - 02/13/19 1308      PT SHORT TERM GOAL #1   Title  Pt will be independent  with HEP in order to improve strength and balance in order to improve function at home    Baseline  01/02/19    Time  4    Period  Weeks    Status  Achieved        PT Long Term Goals - 02/25/19 1313      PT LONG TERM GOAL #1   Title  Pt will decrease quick DASH score by at least 8% in order to demonstrate clinically significant reduction in disability.  Baseline  02/25/19 56.8%    Time  8    Period  Weeks    Status  Achieved      PT LONG TERM GOAL #2   Title  Patient will demonstrate full AROM in L shoulder in order to complete household ADLs    Baseline  01/02/19 74* Flexion, 58* ABDCT: 02/13/19: LUE shoulder fleixon 126*, abduction 95*; 02/25/19 120* Flexion, 95* ABDCT, IR  L PSIS, ER C7    Time  8    Period  Weeks    Status  On-going      PT LONG TERM GOAL #3   Title  Pt will decrease worst pain as reported on NPRS by at least 3 points in order to demonstrate clinically significant reduction in pain.    Baseline  02/25/19 2/10 pain with movement    Time  8    Period  Weeks    Status  Achieved      PT LONG TERM GOAL #4   Title  Pt will demonstrate gross bilat shoulder and periscapular strength of 4/10 in order to safely lift for heavy household chores    Baseline  02/25/19 R/L  shoulder flex: 4+/4 abd: 4+/4+ IR: 3+/4 ER: 3+/4 ext: Periscap Y 3+/3- T 4/4-  I 4+/4- 01/02/19 see eval    Time  8    Period  Weeks            Plan - 03/04/19 1632    Clinical Impression Statement  PT continued therex progression for increased shoulder ROM and stability with good success. Patient is able to complete all therex with some discomfort that subsides with rest. Patient motivated throughout session. PT will continue progression as able.    Personal Factors and Comorbidities  Age;Sex;Behavior Pattern;Comorbidity 1;Fitness    Comorbidities  HTN    Examination-Activity Limitations  Dressing;Carry;Reach Overhead;Lift    Examination-Participation Restrictions  Driving;Community  Activity;Cleaning    Stability/Clinical Decision Making  Evolving/Moderate complexity    Clinical Decision Making  Moderate    Rehab Potential  Good    PT Frequency  2x / week    PT Duration  8 weeks    PT Treatment/Interventions  ADLs/Self Care Home Management;Electrical Stimulation;Therapeutic activities;Passive range of motion;Manual techniques;Patient/family education;Therapeutic exercise;Iontophoresis 9m/ml Dexamethasone;Moist Heat;Traction;Cryotherapy;Ultrasound;Functional mobility training;Neuromuscular re-education;Balance training;Dry needling;Joint Manipulations    PT Next Visit Plan  Continue to gently progress mobility and strength    Consulted and Agree with Plan of Care  Patient       Patient will benefit from skilled therapeutic intervention in order to improve the following deficits and impairments:  Decreased mobility, Decreased endurance, Pain, Postural dysfunction, Impaired UE functional use, Impaired flexibility, Increased fascial restricitons, Decreased strength, Decreased coordination, Decreased range of motion, Improper body mechanics  Visit Diagnosis: Stiffness of left shoulder, not elsewhere classified  Acute pain of left shoulder  Muscle weakness (generalized)  Difficulty in walking, not elsewhere classified  Pain in left ankle and joints of left foot     Problem List Patient Active Problem List   Diagnosis Date Noted  . 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 Chronic left shoulder pain 08/23/2018  . Chronic diarrhea 12/07/2014  . Segmental colitis with rectal bleeding ([place] 09/12/2014  . Routine general medical examination at a health care facility 06/05/2014  . Overweight 06/13/2011  . ABNORMAL CHEST XRAY 09/30/2007  . Hyperlipidemia 02/17/2007  . Essential hypertension 01/08/2007  . Osteoarthritis 01/08/2007   CShelton SilvasPT, DPT CShelton Silvas11/23/2020, 4:55 PM  [facility] [address] [place], [address] Phone: [phone]   Fax:  [phone]  Name: TANEASHA FUQUA MRN: [id number] Date of Birth: July 05, 1950

## 2019-03-06 ENCOUNTER — Encounter: Payer: Self-pay | Admitting: Orthopedic Surgery

## 2019-03-06 ENCOUNTER — Ambulatory Visit (INDEPENDENT_AMBULATORY_CARE_PROVIDER_SITE_OTHER): Payer: Medicare Other

## 2019-03-06 ENCOUNTER — Other Ambulatory Visit: Payer: Self-pay

## 2019-03-06 ENCOUNTER — Ambulatory Visit (INDEPENDENT_AMBULATORY_CARE_PROVIDER_SITE_OTHER): Payer: Medicare Other | Admitting: Orthopedic Surgery

## 2019-03-06 ENCOUNTER — Encounter: Payer: Self-pay | Admitting: Physical Therapy

## 2019-03-06 ENCOUNTER — Ambulatory Visit: Payer: Medicare Other | Admitting: Physical Therapy

## 2019-03-06 DIAGNOSIS — R262 Difficulty in walking, not elsewhere classified: Secondary | ICD-10-CM | POA: Diagnosis not present

## 2019-03-06 DIAGNOSIS — M6281 Muscle weakness (generalized): Secondary | ICD-10-CM | POA: Diagnosis not present

## 2019-03-06 DIAGNOSIS — M542 Cervicalgia: Secondary | ICD-10-CM | POA: Diagnosis not present

## 2019-03-06 DIAGNOSIS — M25512 Pain in left shoulder: Secondary | ICD-10-CM | POA: Diagnosis not present

## 2019-03-06 DIAGNOSIS — M25612 Stiffness of left shoulder, not elsewhere classified: Secondary | ICD-10-CM | POA: Diagnosis not present

## 2019-03-06 DIAGNOSIS — M25572 Pain in left ankle and joints of left foot: Secondary | ICD-10-CM | POA: Diagnosis not present

## 2019-03-06 NOTE — Progress Notes (Signed)
Office Visit Note   Patient: [name]           Date of Birth: 1950-11-19           MRN: [id number] Visit Date: 03/06/2019 Requested by: Hoyt Koch, MD [place],  [place] [id number] PCP: Hoyt Koch, MD  Subjective: Chief Complaint  Patient presents with  . Follow-up    HPI: [name] is a patient is now about 3 months out left reverse shoulder replacement.  She is doing well with that.  In therapy 2 times a week.  She has therapy 3 December.  She is working on strengthening at this time.  She also reports neck pain which is fairly significant.  Takes Tylenol for the pain.  Has some occasional radicular symptoms with numbness and tingling in the fingertips.  She has had this neck pain for quite a long time.  Denies any weakness in the arms.              ROS: All systems reviewed are negative as they relate to the chief complaint within the history of present illness.  Patient denies  fevers or chills.   Assessment & Plan: Visit Diagnoses:  1. Neck pain     Plan: Impression is well-functioning left shoulder replacement reverse type prosthesis.  Continue with therapy through December.  Regarding the neck she has significant arthritis at C5-6 and that is likely causing a lot of this neck pain with some radicular symptoms and pain with rotation.  Based on duration of symptoms which is over a year along with radicular nature and interference with activities of daily living and failure of conservative treatment I would favor MRI scanning with possible epidural steroid injections to follow.  Plan MRI scan and I will see her back after that study.  Follow-Up Instructions: Return for after MRI.   Orders:  Orders Placed This Encounter  Procedures  . XR Cervical Spine 2 or 3 views   No orders of the defined types were placed in this encounter.     Procedures: No procedures performed   Clinical Data: No additional findings.  Objective: Vital  Signs: LMP 02/09/2005   Physical Exam:   Constitutional: Patient appears well-developed HEENT:  Head: Normocephalic Eyes:EOM are normal Neck: Normal range of motion Cardiovascular: Normal rate Pulmonary/chest: Effort normal Neurologic: Patient is alert Skin: Skin is warm Psychiatric: Patient has normal mood and affect    Ortho Exam: Ortho exam demonstrates extension and flexion intact but rotation is limited to about 45 degrees bilaterally.  She has some numbness and tingling in the fingertips but negative Tinel's in the cubital tunnel.  Good range of motion of the left shoulder with active forward flexion abduction both above 90 degrees.  No masses lymphadenopathy or skin changes noted in the neck region or in that left shoulder region.  Radial pulse intact bilaterally.  No definite paresthesias C5-T1 but she does have some numbness in both fingertips.  Specialty Comments:  No specialty comments available.  Imaging: Xr Cervical Spine 2 Or 3 Views  Result Date: 03/06/2019 AP lateral cervical spine reviewed.  Significant degenerative disc disease is present at C5-6 and to a lesser degree at C4-5 and C6-7.  Facet arthritis also present.  No compression fracture.  No spondylolisthesis.    PMFS History: Patient Active Problem List   Diagnosis Date Noted  . 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 Chronic left shoulder pain 08/23/2018  . Chronic diarrhea  12/07/2014  . Segmental colitis with rectal bleeding ([place]) 09/12/2014  . Routine general medical examination at a health care facility 06/05/2014  . Overweight 06/13/2011  . ABNORMAL CHEST XRAY 09/30/2007  . Hyperlipidemia 02/17/2007  . Essential hypertension 01/08/2007  . Osteoarthritis 01/08/2007   Past Medical History:  Diagnosis Date  . Arthritis    back, fingers with joint pain and swelling.  chronic back pain  . Chronic back pain    arthritis   . Diverticulosis   . Elevated cholesterol    takes Niacin daily  and Simvastatin  . GERD (gastroesophageal reflux disease) 06/2004   non-specific gastritis on EGD 06/2004  . Headache(784.0)    occasionally  . History of colon polyps 2004, 2009   2004:adenomatous. 2009 hyperplastic.   Marland Kitchen History of hiatal hernia   . Hypertension    takes Tenoretic and Lisinopril daily  . Mucoid cyst of joint 08/2013   right index finger  . Osteopenia 01/2014   T score -1.1 FRAX 14%/0.5%. Stable from prior DEXA  . PONV (postoperative nausea and vomiting)   . Rotator cuff arthropathy    Left  . Seasonal allergies   . Stroke ([place]) 1998   x 2 - mild left-sided weakness  . Urge incontinence   . Uterine prolapse     Family History  Problem Relation Age of Onset  . Hypertension Mother   . Heart disease Mother   . Diabetes Father   . Hypertension Father   . Heart disease Father   . Stroke Father   . Diabetes Maternal Aunt   . Diabetes Paternal Grandmother   . Hypertension Paternal Grandfather   . Stroke Paternal Grandfather     Past Surgical History:  Procedure Laterality Date  . BACK SURGERY     x 2  . BELPHAROPTOSIS REPAIR Bilateral 12/14/2017  . CATARACT EXTRACTION    . CATARACT EXTRACTION Bilateral 10/2017  . COLONOSCOPY  2004, 2009, 2014  . FINGER SURGERY    . GUM SURGERY    . GYNECOLOGIC CRYOSURGERY    . HYSTEROSCOPY W/D&C  12/07/2010   with resection of endometrial polyp  . JOINT REPLACEMENT    . KNEE ARTHROSCOPY Left 2008  . lip biopsy     done at MD office Fri 11/22/13  . MASS EXCISION Right 08/15/2013   Procedure: RIGHT INDEX EXCISION MASS ;  Surgeon: Tennis Must, MD;  Location: [facility];  Service: Orthopedics;  Laterality: Right;  . NASAL SEPTUM SURGERY    . OOPHORECTOMY Right 2000  . REVERSE SHOULDER ARTHROPLASTY Left 12/11/2018  . REVERSE SHOULDER ARTHROPLASTY Left 12/11/2018   Procedure: LEFT REVERSE SHOULDER ARTHROPLASTY;  Surgeon: Meredith Pel, MD;  Location: [facility];  Service: Orthopedics;  Laterality: Left;  .  SHOULDER ARTHROSCOPY WITH OPEN ROTATOR CUFF REPAIR AND DISTAL CLAVICLE ACROMINECTOMY Right 11/26/2013   Procedure: RIGHT SHOULDER ARTHROSCOPY WITH MINI OPEN ROTATOR CUFF REPAIR AND DISTAL CLAVICLE RESECTION, SUBACROMIAL DECOMPRESSION, POSSIBLE [facility] PATCH.;  Surgeon: [name], MD;  Location: [facility];  Service: Orthopedics;  Laterality: Right;  . TOTAL KNEE ARTHROPLASTY Right 03/21/2017  . TOTAL KNEE ARTHROPLASTY Right 03/21/2017   Procedure: RIGHT TOTAL KNEE ARTHROPLASTY;  Surgeon: [name], MD;  Location: [facility];  Service: Orthopedics;  Laterality: Right;  . TUBAL LIGATION     Social History   Occupational History  . Occupation: Animal nutritionist    Comment: Disability/Retired  Tobacco Use  . Smoking status: Passive Smoke Exposure -  Never Smoker  . Smokeless tobacco: Never Used  Substance and Sexual Activity  . Alcohol use: Not Currently    Alcohol/week: 0.0 standard drinks    Frequency: Never    Comment: about 2-3 times a year  . Drug use: No  . Sexual activity: Yes    Partners: Male    Birth control/protection: Surgical, Post-menopausal    Comment: BTL-1st intercourse 68 yo-More than 5 partners

## 2019-03-06 NOTE — Addendum Note (Signed)
Addended byLaurann Montana on: 03/06/2019 11:28 AM   Modules accepted: Orders

## 2019-03-06 NOTE — Therapy (Signed)
[facility] [address], [place], [address] Phone: [phone]   Fax:  [phone]  Physical Therapy Treatment  Patient Details  Name: [name] MRN: [id number] Date of Birth: Sep 24, 1950 No data recorded  Encounter Date: 03/06/2019  PT End of Session - 03/06/19 1521    Visit Number  20    Number of Visits  29    Date for PT Re-Evaluation  04/08/19    PT Start Time  0315    PT Stop Time  0400    PT Time Calculation (min)  45 min    Activity Tolerance  Patient tolerated treatment well;No increased pain    Behavior During Therapy  WFL for tasks assessed/performed       Past Medical History:  Diagnosis Date  . Arthritis    back, fingers with joint pain and swelling.  chronic back pain  . Chronic back pain    arthritis   . Diverticulosis   . Elevated cholesterol    takes Niacin daily and Simvastatin  . GERD (gastroesophageal reflux disease) 06/2004   non-specific gastritis on EGD 06/2004  . Headache(784.0)    occasionally  . History of colon polyps 2004, 2009   2004:adenomatous. 2009 hyperplastic.   Marland Kitchen History of hiatal hernia   . Hypertension    takes Tenoretic and Lisinopril daily  . Mucoid cyst of joint 08/2013   right index finger  . Osteopenia 01/2014   T score -1.1 FRAX 14%/0.5%. Stable from prior DEXA  . PONV (postoperative nausea and vomiting)   . Rotator cuff arthropathy    Left  . Seasonal allergies   . Stroke ([place]) 1998   x 2 - mild left-sided weakness  . Urge incontinence   . Uterine prolapse     Past Surgical History:  Procedure Laterality Date  . BACK SURGERY     x 2  . BELPHAROPTOSIS REPAIR Bilateral 12/14/2017  . CATARACT EXTRACTION    . CATARACT EXTRACTION Bilateral 10/2017  . COLONOSCOPY  2004, 2009, 2014  . FINGER SURGERY    . GUM SURGERY    . GYNECOLOGIC CRYOSURGERY    . HYSTEROSCOPY W/D&C  12/07/2010   with resection of endometrial polyp  . JOINT  REPLACEMENT    . KNEE ARTHROSCOPY Left 2008  . lip biopsy     done at MD office Fri 11/22/13  . MASS EXCISION Right 08/15/2013   Procedure: RIGHT INDEX EXCISION MASS ;  Surgeon: Tennis Must, MD;  Location: [facility];  Service: Orthopedics;  Laterality: Right;  . NASAL SEPTUM SURGERY    . OOPHORECTOMY Right 2000  . REVERSE SHOULDER ARTHROPLASTY Left 12/11/2018  . REVERSE SHOULDER ARTHROPLASTY Left 12/11/2018   Procedure: LEFT REVERSE SHOULDER ARTHROPLASTY;  Surgeon: Meredith Pel, MD;  Location: [facility];  Service: Orthopedics;  Laterality: Left;  . SHOULDER ARTHROSCOPY WITH OPEN ROTATOR CUFF REPAIR AND DISTAL CLAVICLE ACROMINECTOMY Right 11/26/2013   Procedure: RIGHT SHOULDER ARTHROSCOPY WITH MINI OPEN ROTATOR CUFF REPAIR AND DISTAL CLAVICLE RESECTION, SUBACROMIAL DECOMPRESSION, POSSIBLE [facility] PATCH.;  Surgeon: [name], MD;  Location: [facility];  Service: Orthopedics;  Laterality: Right;  . TOTAL KNEE ARTHROPLASTY Right 03/21/2017  . TOTAL KNEE ARTHROPLASTY Right 03/21/2017   Procedure: RIGHT TOTAL KNEE ARTHROPLASTY;  Surgeon: [name], MD;  Location: [facility];  Service: Orthopedics;  Laterality: Right;  . TUBAL LIGATION      There were no vitals filed for this visit.  Subjective Assessment - 03/06/19 1515    Subjective  Patinet reports she is doing well overall. 1-2/10 pain with motion. Saw her surgeon this morning, with a good report for the shoulder. Reports she had an Xray of her neck with decreased disc height noted, MRI scheduled.    Pertinent History  Pt is a 68 year old female s/p L reverse total shoulder 12/11/18. Reports she was immobilized in her sling for 2 weeks. After immobilization has been using passive motion machine for flexion. Has been doing AAROM flexion/abd/ER, bicep, shrugs, and forward punches with 1# with HHPT over the past week. MD Marlou Sa advised patient for out of sling, AAROM, PROM with no lifting of more than 5# until next followup with  him (mid Oct). Pt reports only 2/10 at rest, 6/10 with motion. Pain is at the medial upper arm and in the armpit. Pain is worse during the evening after moving it all day. Patient lives with her husband and her 4 dogs, which she walks frequently and cares for. She is R handed.Pt denies N/V, B&B changes, unexplained weight fluctuation, saddle paresthesia, fever, night sweats, or unrelenting night pain at this time.Pt denies N/V, B&B changes, unexplained weight fluctuation, saddle paresthesia, fever, night sweats, or unrelenting night pain at this time.    Limitations  House hold activities;Lifting    How long can you sit comfortably?  unlimited    How long can you stand comfortably?  unlimited    How long can you walk comfortably?  unlimited    Diagnostic tests  Xrays    Patient Stated Goals  Being able to complete daily tasks    Pain Onset  1 to 4 weeks ago       Ther-Ex - UBE L 1 resistance 4mn forward; 234m backward - TRX walkouts abd 69m12mflex 69mi369mbd with 5sec holds cuing for max available ROM with good carry over - UE ranger clockwise and counterclockwise x15 each direction - Standing row 5# (rowing to neutral/slight ext) 3x 10 min cuing initially for eccentric control - Lat Pulldown 10# 3x 10 with good carry over from previous session - Seated military press overhead AROM 3x 10 with cuing for ER " wall slide with decent carry over - Small body blade at 90d flex wit cuing for scapular retraction 3x 10sec - Standing IR<>ER belly to approx neutral 3x 10 2# with min cuing for eccentric control with good carry over                           PT Education - 03/06/19 1518    Education Details  Exercise form    Person(s) Educated  Patient    Methods  Explanation;Demonstration;Verbal cues    Comprehension  Verbalized understanding;Returned demonstration;Verbal cues required       PT Short Term Goals - 02/13/19 1308      PT SHORT TERM GOAL #1   Title  Pt will be  independent with HEP in order to improve strength and balance in order to improve function at home    Baseline  01/02/19    Time  4    Period  Weeks    Status  Achieved        PT Long Term Goals - 02/25/19 1313      PT LONG TERM GOAL #1   Title  Pt will decrease quick DASH score by at least 8% in order to demonstrate clinically significant reduction in disability.  Baseline  02/25/19 56.8%    Time  8    Period  Weeks    Status  Achieved      PT LONG TERM GOAL #2   Title  Patient will demonstrate full AROM in L shoulder in order to complete household ADLs    Baseline  01/02/19 74* Flexion, 58* ABDCT: 02/13/19: LUE shoulder fleixon 126*, abduction 95*; 02/25/19 120* Flexion, 95* ABDCT, IR  L PSIS, ER C7    Time  8    Period  Weeks    Status  On-going      PT LONG TERM GOAL #3   Title  Pt will decrease worst pain as reported on NPRS by at least 3 points in order to demonstrate clinically significant reduction in pain.    Baseline  02/25/19 2/10 pain with movement    Time  8    Period  Weeks    Status  Achieved      PT LONG TERM GOAL #4   Title  Pt will demonstrate gross bilat shoulder and periscapular strength of 4/10 in order to safely lift for heavy household chores    Baseline  02/25/19 R/L  shoulder flex: 4+/4 abd: 4+/4+ IR: 3+/4 ER: 3+/4 ext: Periscap Y 3+/3- T 4/4-  I 4+/4- 01/02/19 see eval    Time  8    Period  Weeks            Plan - 03/06/19 1548    Clinical Impression Statement  PT continued therex progression for increased shoulder ROM and strength. Patient able to complete all therex with planned breaks, good carry over of all provided cuing and good motivation throughout session. PT will continue progression as able    Personal Factors and Comorbidities  Age;Sex;Behavior Pattern;Comorbidity 1;Fitness    Comorbidities  HTN    Examination-Activity Limitations  Dressing;Carry;Reach Overhead;Lift    Examination-Participation Restrictions  Driving;Community  Activity;Cleaning    Stability/Clinical Decision Making  Evolving/Moderate complexity    Clinical Decision Making  Moderate    Rehab Potential  Good    PT Frequency  2x / week    PT Duration  8 weeks    PT Treatment/Interventions  ADLs/Self Care Home Management;Electrical Stimulation;Therapeutic activities;Passive range of motion;Manual techniques;Patient/family education;Therapeutic exercise;Iontophoresis 37m/ml Dexamethasone;Moist Heat;Traction;Cryotherapy;Ultrasound;Functional mobility training;Neuromuscular re-education;Balance training;Dry needling;Joint Manipulations    PT Next Visit Plan  Continue to gently progress mobility and strength    PT Home Exercise Plan  [id number]    Consulted and Agree with Plan of Care  Patient       Patient will benefit from skilled therapeutic intervention in order to improve the following deficits and impairments:  Decreased mobility, Decreased endurance, Pain, Postural dysfunction, Impaired UE functional use, Impaired flexibility, Increased fascial restricitons, Decreased strength, Decreased coordination, Decreased range of motion, Improper body mechanics  Visit Diagnosis: Stiffness of left shoulder, not elsewhere classified  Acute pain of left shoulder  Muscle weakness (generalized)     Problem List Patient Active Problem List   Diagnosis Date Noted  . 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 Chronic left shoulder pain 08/23/2018  . Chronic diarrhea 12/07/2014  . Seg[place] 09/12/2014  . Routine general medical examination at a health care facility 06/05/2014  . Overweight 06/13/2011  . ABNORMAL CHEST XRAY 09/30/2007  . Hyperlipidemia 02/17/2007  . Essential hypertension 01/08/2007  . Osteoarthritis 01/08/2007   CShelton SilvasPT, DPT CShelton Silvas11/25/2020, 3:53 PM  [facility] [address] [place], [address] Phone:  [phone]   Fax:  [phone]  Name: [name] MRN: [id number] Date of Birth: Jul 24, 1950

## 2019-03-11 ENCOUNTER — Encounter: Payer: Self-pay | Admitting: Physical Therapy

## 2019-03-11 ENCOUNTER — Ambulatory Visit: Payer: Medicare Other | Admitting: Physical Therapy

## 2019-03-11 ENCOUNTER — Other Ambulatory Visit: Payer: Self-pay

## 2019-03-11 DIAGNOSIS — M6281 Muscle weakness (generalized): Secondary | ICD-10-CM | POA: Diagnosis not present

## 2019-03-11 DIAGNOSIS — M25572 Pain in left ankle and joints of left foot: Secondary | ICD-10-CM | POA: Diagnosis not present

## 2019-03-11 DIAGNOSIS — M25612 Stiffness of left shoulder, not elsewhere classified: Secondary | ICD-10-CM | POA: Diagnosis not present

## 2019-03-11 DIAGNOSIS — M25512 Pain in left shoulder: Secondary | ICD-10-CM | POA: Diagnosis not present

## 2019-03-11 DIAGNOSIS — R262 Difficulty in walking, not elsewhere classified: Secondary | ICD-10-CM | POA: Diagnosis not present

## 2019-03-11 NOTE — Therapy (Signed)
[facility] [address], [place], [address] Phone: [phone]   Fax:  [phone]  Physical Therapy Treatment  Patient Details  Name: [name] MRN: [id number] Date of Birth: [birth year] No data recorded  Encounter Date: 03/11/2019  PT End of Session - 03/11/19 1401    Visit Number  21    Number of Visits  29    Date for PT Re-Evaluation  04/08/19    PT Start Time  0150    PT Stop Time  0230    PT Time Calculation (min)  40 min    Activity Tolerance  Patient tolerated treatment well;No increased pain    Behavior During Therapy  WFL for tasks assessed/performed       Past Medical History:  Diagnosis Date  . Arthritis    back, fingers with joint pain and swelling.  chronic back pain  . Chronic back pain    arthritis   . Diverticulosis   . Elevated cholesterol    takes Niacin daily and Simvastatin  . GERD (gastroesophageal reflux disease) 06/2004   non-specific gastritis on EGD 06/2004  . Headache(784.0)    occasionally  . History of colon polyps 2004, 2009   2004:adenomatous. 2009 hyperplastic.   Marland Kitchen History of hiatal hernia   . Hypertension    takes Tenoretic and Lisinopril daily  . Mucoid cyst of joint 08/2013   right index finger  . Osteopenia 01/2014   T score -1.1 FRAX 14%/0.5%. Stable from prior DEXA  . PONV (postoperative nausea and vomiting)   . Rotator cuff arthropathy    Left  . Seasonal allergies   . Stroke ([facility]) 1998   x 2 - mild left-sided weakness  . Urge incontinence   . Uterine prolapse     Past Surgical History:  Procedure Laterality Date  . BACK SURGERY     x 2  . BELPHAROPTOSIS REPAIR Bilateral 12/14/2017  . CATARACT EXTRACTION    . CATARACT EXTRACTION Bilateral 10/2017  . COLONOSCOPY  2004, 2009, 2014  . FINGER SURGERY    . GUM SURGERY    . GYNECOLOGIC CRYOSURGERY    . HYSTEROSCOPY W/D&C  12/07/2010   with resection of endometrial polyp  . JOINT  REPLACEMENT    . KNEE ARTHROSCOPY Left 2008  . lip biopsy     done at MD office Fri 11/22/13  . MASS EXCISION Right 08/15/2013   Procedure: RIGHT INDEX EXCISION MASS ;  Surgeon: Tennis Must, MD;  Location: [facility];  Service: Orthopedics;  Laterality: Right;  . NASAL SEPTUM SURGERY    . OOPHORECTOMY Right 2000  . REVERSE SHOULDER ARTHROPLASTY Left 12/11/2018  . REVERSE SHOULDER ARTHROPLASTY Left 12/11/2018   Procedure: LEFT REVERSE SHOULDER ARTHROPLASTY;  Surgeon: Meredith Pel, MD;  Location: [facility];  Service: Orthopedics;  Laterality: Left;  . SHOULDER ARTHROSCOPY WITH OPEN ROTATOR CUFF REPAIR AND DISTAL CLAVICLE ACROMINECTOMY Right 11/26/2013   Procedure: RIGHT SHOULDER ARTHROSCOPY WITH MINI OPEN ROTATOR CUFF REPAIR AND DISTAL CLAVICLE RESECTION, SUBACROMIAL DECOMPRESSION, POSSIBLE [facility] PATCH.;  Surgeon: [name], MD;  Location: [facility];  Service: Orthopedics;  Laterality: Right;  . TOTAL KNEE ARTHROPLASTY Right 03/21/2017  . TOTAL KNEE ARTHROPLASTY Right 03/21/2017   Procedure: RIGHT TOTAL KNEE ARTHROPLASTY;  Surgeon: [name], MD;  Location: [facility];  Service: Orthopedics;  Laterality: Right;  . TUBAL LIGATION      There were no vitals filed for this visit.  Subjective Assessment - 03/11/19 1352    Subjective  Pt reports cervical pain that is cusing some bilat numbness. Reports she is scheduling an MRI for this. Reports some increased pain in L shoulder 3/10 d/t less HEP compliance d/t family being over for the holiday.    Pertinent History  Pt is a 68 year old female s/p L reverse total shoulder 12/11/18. Reports she was immobilized in her sling for 2 weeks. After immobilization has been using passive motion machine for flexion. Has been doing AAROM flexion/abd/ER, bicep, shrugs, and forward punches with 1# with HHPT over the past week. MD Marlou Sa advised patient for out of sling, AAROM, PROM with no lifting of more than 5# until next followup with him  (mid Oct). Pt reports only 2/10 at rest, 6/10 with motion. Pain is at the medial upper arm and in the armpit. Pain is worse during the evening after moving it all day. Patient lives with her husband and her 4 dogs, which she walks frequently and cares for. She is R handed.Pt denies N/V, B&B changes, unexplained weight fluctuation, saddle paresthesia, fever, night sweats, or unrelenting night pain at this time.Pt denies N/V, B&B changes, unexplained weight fluctuation, saddle paresthesia, fever, night sweats, or unrelenting night pain at this time.    Limitations  House hold activities;Lifting    How long can you sit comfortably?  unlimited    How long can you stand comfortably?  unlimited    How long can you walk comfortably?  unlimited    Diagnostic tests  Xrays    Patient Stated Goals  Being able to complete daily tasks    Pain Onset  1 to 4 weeks ago     Shoulder motion at beginning of session Flex 120d abd 90d IR L PSIS ER L occiput   Ther-Ex - UBE L 1 resistance 63mn forward; 257m backward - Sidelying abd 3 x10 to 145d  - Sidelying ER with controled IR belly to neutral x15 - Sidelying ER 1# DB 2x 10 min cuing for eccentric lower with good carry over - Supine flex with maintained elbow ext 1# DB 3x 10 good carry over of proper technique to aout 135d - Prone scapular retraction in T 3x 10 with min cuing for set up with good carry over - Prone row 3# 3x 10 with min cuing for initial form/set up with good carry over - Standing IR RTB, towel under elbow cuing for maintained positioning with good carry over - Lat [place] 10# 2x 10 with good carry over from previous session (attempted 15#, unable)                         PT Education - 03/11/19 1355    Education Details  therex form    Person(s) Educated  Patient    Methods  Explanation;Demonstration;Verbal cues    Comprehension  Verbalized understanding;Returned demonstration;Verbal cues required       PT  Short Term Goals - 02/13/19 1308      PT SHORT TERM GOAL #1   Title  Pt will be independent with HEP in order to improve strength and balance in order to improve function at home    Baseline  01/02/19    Time  4    Period  Weeks    Status  Achieved        PT Long Term Goals - 02/25/19 1313      PT LONG TERM GOAL #1  Title  Pt will decrease quick DASH score by at least 8% in order to demonstrate clinically significant reduction in disability.    Baseline  02/25/19 56.8%    Time  8    Period  Weeks    Status  Achieved      PT LONG TERM GOAL #2   Title  Patient will demonstrate full AROM in L shoulder in order to complete household ADLs    Baseline  01/02/19 74* Flexion, 58* ABDCT: 02/13/19: LUE shoulder fleixon 126*, abduction 95*; 02/25/19 120* Flexion, 95* ABDCT, IR  L PSIS, ER C7    Time  8    Period  Weeks    Status  On-going      PT LONG TERM GOAL #3   Title  Pt will decrease worst pain as reported on NPRS by at least 3 points in order to demonstrate clinically significant reduction in pain.    Baseline  02/25/19 2/10 pain with movement    Time  8    Period  Weeks    Status  Achieved      PT LONG TERM GOAL #4   Title  Pt will demonstrate gross bilat shoulder and periscapular strength of 4/10 in order to safely lift for heavy household chores    Baseline  02/25/19 R/L  shoulder flex: 4+/4 abd: 4+/4+ IR: 3+/4 ER: 3+/4 ext: Periscap Y 3+/3- T 4/4-  I 4+/4- 01/02/19 see eval    Time  8    Period  Weeks            Plan - 03/11/19 1410    Clinical Impression Statement  PT continued therex progression for increased shoulder motinon and shoulder/periscapular strength, and motor control with good success. Pt motivated thorughout sesison, able to complete all therex with proper technique following some cuing. PT will continue progression as able.    Personal Factors and Comorbidities  Age;Sex;Behavior Pattern;Comorbidity 1;Fitness    Comorbidities  HTN     Examination-Activity Limitations  Dressing;Carry;Reach Overhead;Lift    Examination-Participation Restrictions  Driving;Community Activity;Cleaning    Stability/Clinical Decision Making  Evolving/Moderate complexity    Clinical Decision Making  Moderate    Rehab Potential  Good    PT Frequency  2x / week    PT Duration  8 weeks    PT Treatment/Interventions  ADLs/Self Care Home Management;Electrical Stimulation;Therapeutic activities;Passive range of motion;Manual techniques;Patient/family education;Therapeutic exercise;Iontophoresis 32m/ml Dexamethasone;Moist Heat;Traction;Cryotherapy;Ultrasound;Functional mobility training;Neuromuscular re-education;Balance training;Dry needling;Joint Manipulations    PT Next Visit Plan  Continue to gently progress mobility and strength    PT Home Exercise Plan  [id number]    Consulted and Agree with Plan of Care  Patient       Patient will benefit from skilled therapeutic intervention in order to improve the following deficits and impairments:  Decreased mobility, Decreased endurance, Pain, Postural dysfunction, Impaired UE functional use, Impaired flexibility, Increased fascial restricitons, Decreased strength, Decreased coordination, Decreased range of motion, Improper body mechanics  Visit Diagnosis: Stiffness of left shoulder, not elsewhere classified  Acute pain of left shoulder  Muscle weakness (generalized)     Problem List Patient Active Problem List   Diagnosis Date Noted  . 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 Chronic left shoulder pain 08/23/2018  . Chronic diarrhea 12/07/2014  . Segmental colitis with rectal bleeding ([facility] 09/12/2014  . Routine general medical examination at a health care facility 06/05/2014  . Overweight 06/13/2011  . ABNORMAL CHEST XRAY 09/30/2007  . Hyperlipidemia 02/17/2007  . Essential  hypertension 01/08/2007  . Osteoarthritis 01/08/2007   Shelton Silvas PT, DPT Shelton Silvas 03/11/2019, 2:31  PM  [facility] [address], [place], [address] Phone: [phone]   Fax:  [phone]  Name: [name] MRN: [id number] Date of Birth: Apr 23, 1950

## 2019-03-13 ENCOUNTER — Ambulatory Visit: Payer: Medicare Other | Attending: Orthopedic Surgery | Admitting: Physical Therapy

## 2019-03-13 ENCOUNTER — Other Ambulatory Visit: Payer: Self-pay

## 2019-03-13 ENCOUNTER — Encounter: Payer: Self-pay | Admitting: Physical Therapy

## 2019-03-13 DIAGNOSIS — M25612 Stiffness of left shoulder, not elsewhere classified: Secondary | ICD-10-CM | POA: Diagnosis not present

## 2019-03-13 DIAGNOSIS — M25512 Pain in left shoulder: Secondary | ICD-10-CM | POA: Diagnosis not present

## 2019-03-13 DIAGNOSIS — M6281 Muscle weakness (generalized): Secondary | ICD-10-CM | POA: Diagnosis not present

## 2019-03-13 DIAGNOSIS — G8929 Other chronic pain: Secondary | ICD-10-CM | POA: Insufficient documentation

## 2019-03-13 DIAGNOSIS — M25572 Pain in left ankle and joints of left foot: Secondary | ICD-10-CM | POA: Insufficient documentation

## 2019-03-13 DIAGNOSIS — M25561 Pain in right knee: Secondary | ICD-10-CM | POA: Insufficient documentation

## 2019-03-13 DIAGNOSIS — R262 Difficulty in walking, not elsewhere classified: Secondary | ICD-10-CM | POA: Insufficient documentation

## 2019-03-13 DIAGNOSIS — M25661 Stiffness of right knee, not elsewhere classified: Secondary | ICD-10-CM | POA: Diagnosis not present

## 2019-03-13 NOTE — Therapy (Signed)
[facility] [address] [place], [address] Phone: [phone]   Fax:  [phone]  Physical Therapy Treatment  Patient Details  Name: [name] MRN: [id number] Date of Birth: 1950-05-23 No data recorded  Encounter Date: 03/13/2019  PT End of Session - 03/13/19 1437    Visit Number  22    Number of Visits  29    Date for PT Re-Evaluation  04/08/19    PT Start Time  0230    PT Stop Time  0315    PT Time Calculation (min)  45 min    Activity Tolerance  Patient tolerated treatment well;No increased pain    Behavior During Therapy  WFL for tasks assessed/performed       Past Medical History:  Diagnosis Date  . Arthritis    back, fingers with joint pain and swelling.  chronic back pain  . Chronic back pain    arthritis   . Diverticulosis   . Elevated cholesterol    takes Niacin daily and Simvastatin  . GERD (gastroesophageal reflux disease) 06/2004   non-specific gastritis on EGD 06/2004  . Headache(784.0)    occasionally  . History of colon polyps 2004, 2009   2004:adenomatous. 2009 hyperplastic.   Marland Kitchen History of hiatal hernia   . Hypertension    takes Tenoretic and Lisinopril daily  . Mucoid cyst of joint 08/2013   right index finger  . Osteopenia 01/2014   T score -1.1 FRAX 14%/0.5%. Stable from prior DEXA  . PONV (postoperative nausea and vomiting)   . Rotator cuff arthropathy    Left  . Seasonal allergies   . Stroke ([place]) 1998   x 2 - mild left-sided weakness  . Urge incontinence   . Uterine prolapse     Past Surgical History:  Procedure Laterality Date  . BACK SURGERY     x 2  . BELPHAROPTOSIS REPAIR Bilateral 12/14/2017  . CATARACT EXTRACTION    . CATARACT EXTRACTION Bilateral 10/2017  . COLONOSCOPY  2004, 2009, 2014  . FINGER SURGERY    . GUM SURGERY    . GYNECOLOGIC CRYOSURGERY    . HYSTEROSCOPY W/D&C  12/07/2010   with resection of endometrial polyp  . JOINT REPLACEMENT     . KNEE ARTHROSCOPY Left 2008  . lip biopsy     done at MD office Fri 11/22/13  . MASS EXCISION Right 08/15/2013   Procedure: RIGHT INDEX EXCISION MASS ;  Surgeon: Tennis Must, MD;  Location: [facility];  Service: Orthopedics;  Laterality: Right;  . NASAL SEPTUM SURGERY    . OOPHORECTOMY Right 2000  . REVERSE SHOULDER ARTHROPLASTY Left 12/11/2018  . REVERSE SHOULDER ARTHROPLASTY Left 12/11/2018   Procedure: LEFT REVERSE SHOULDER ARTHROPLASTY;  Surgeon: Meredith Pel, MD;  Location: [facility];  Service: Orthopedics;  Laterality: Left;  . SHOULDER ARTHROSCOPY WITH OPEN ROTATOR CUFF REPAIR AND DISTAL CLAVICLE ACROMINECTOMY Right 11/26/2013   Procedure: RIGHT SHOULDER ARTHROSCOPY WITH MINI OPEN ROTATOR CUFF REPAIR AND DISTAL CLAVICLE RESECTION, SUBACROMIAL DECOMPRESSION, POSSIBLE [facility] PATCH.;  Surgeon: [name], MD;  Location: [facility];  Service: Orthopedics;  Laterality: Right;  . TOTAL KNEE ARTHROPLASTY Right 03/21/2017  . TOTAL KNEE ARTHROPLASTY Right 03/21/2017   Procedure: RIGHT TOTAL KNEE ARTHROPLASTY;  Surgeon: [name], MD;  Location: [facility];  Service: Orthopedics;  Laterality: Right;  . TUBAL LIGATION      There were no vitals filed for this visit.  Subjective Assessment - 03/13/19 1431    Subjective  Reports 1/10 pain, motion is improving. Compliance with HEP    Pertinent History  Pt is a 68 year old female s/p L reverse total shoulder 12/11/18. Reports she was immobilized in her sling for 2 weeks. After immobilization has been using passive motion machine for flexion. Has been doing AAROM flexion/abd/ER, bicep, shrugs, and forward punches with 1# with HHPT over the past week. MD Marlou Sa advised patient for out of sling, AAROM, PROM with no lifting of more than 5# until next followup with him (mid Oct). Pt reports only 2/10 at rest, 6/10 with motion. Pain is at the medial upper arm and in the armpit. Pain is worse during the evening after moving it all day.  Patient lives with her husband and her 4 dogs, which she walks frequently and cares for. She is R handed.Pt denies N/V, B&B changes, unexplained weight fluctuation, saddle paresthesia, fever, night sweats, or unrelenting night pain at this time.Pt denies N/V, B&B changes, unexplained weight fluctuation, saddle paresthesia, fever, night sweats, or unrelenting night pain at this time.    Limitations  House hold activities;Lifting    How long can you sit comfortably?  unlimited    How long can you stand comfortably?  unlimited    How long can you walk comfortably?  unlimited    Diagnostic tests  Xrays    Patient Stated Goals  Being able to complete daily tasks       Shoulder motion at beginning of session Flex 121d abd 94d IR L PSIS ER L occiput   Ther-Ex -UBE L 1 resistance 38mn forward; 268m backward - Sidelying abd x10; with 1# DB 2x 10 to 145d  - Sidelying ER with controled IR belly to neutral x15; 2x 10 1# with good maintained motion to neutral, min fasciculation with lowering - Supine flex with maintained elbow ext 2# DB 3x 10 good carry over of proper technique to approx 145d - Supine scapular punches BUE 2# DB 3x 10 with heavy cuing needed initially for proper technique/form with good carry over - Scaption 1# DB x10; 2# DB 2x 10 with good carry over to prevent shoulder protraction - Prone scapular retraction in T 3x 10 with min cuing for set up with good carry over - Prone row 3# 3x 10 with min cuing for initial form/set up with good carry over - Standing IR GTB 3x 10 with min cuing initially to prevent shoulder hiking with good carry over                         PT Education - 03/13/19 1433    Education Details  therex form    Person(s) Educated  Patient    Methods  Explanation;Demonstration;Verbal cues    Comprehension  Verbalized understanding;Returned demonstration;Verbal cues required       PT Short Term Goals - 02/13/19 1308      PT SHORT TERM  GOAL #1   Title  Pt will be independent with HEP in order to improve strength and balance in order to improve function at home    Baseline  01/02/19    Time  4    Period  Weeks    Status  Achieved        PT Long Term Goals - 02/25/19 1313      PT LONG TERM GOAL #1   Title  Pt will decrease quick DASH score by at least  8% in order to demonstrate clinically significant reduction in disability.    Baseline  02/25/19 56.8%    Time  8    Period  Weeks    Status  Achieved      PT LONG TERM GOAL #2   Title  Patient will demonstrate full AROM in L shoulder in order to complete household ADLs    Baseline  01/02/19 74* Flexion, 58* ABDCT: 02/13/19: LUE shoulder fleixon 126*, abduction 95*; 02/25/19 120* Flexion, 95* ABDCT, IR  L PSIS, ER C7    Time  8    Period  Weeks    Status  On-going      PT LONG TERM GOAL #3   Title  Pt will decrease worst pain as reported on NPRS by at least 3 points in order to demonstrate clinically significant reduction in pain.    Baseline  02/25/19 2/10 pain with movement    Time  8    Period  Weeks    Status  Achieved      PT LONG TERM GOAL #4   Title  Pt will demonstrate gross bilat shoulder and periscapular strength of 4/10 in order to safely lift for heavy household chores    Baseline  02/25/19 R/L  shoulder flex: 4+/4 abd: 4+/4+ IR: 3+/4 ER: 3+/4 ext: Periscap Y 3+/3- T 4/4-  I 4+/4- 01/02/19 see eval    Time  8    Period  Weeks            Plan - 03/13/19 1446    Clinical Impression Statement  PT continued therex progression for increased shoulder/scapular strength and stability. Patient is able to complete all therex with proper technique following some cuing/demonstration. PT will continue progression as able.    Personal Factors and Comorbidities  Age;Sex;Behavior Pattern;Comorbidity 1;Fitness    Comorbidities  HTN    Examination-Activity Limitations  Dressing;Carry;Reach Overhead;Lift    Examination-Participation Restrictions   Driving;Community Activity;Cleaning    Stability/Clinical Decision Making  Evolving/Moderate complexity    Clinical Decision Making  Moderate    Rehab Potential  Good    PT Frequency  2x / week    PT Duration  8 weeks    PT Treatment/Interventions  ADLs/Self Care Home Management;Electrical Stimulation;Therapeutic activities;Passive range of motion;Manual techniques;Patient/family education;Therapeutic exercise;Iontophoresis 10m/ml Dexamethasone;Moist Heat;Traction;Cryotherapy;Ultrasound;Functional mobility training;Neuromuscular re-education;Balance training;Dry needling;Joint Manipulations    PT Next Visit Plan  Continue to gently progress mobility and strength    PT Home Exercise Plan  [id number]    Consulted and Agree with Plan of Care  Patient       Patient will benefit from skilled therapeutic intervention in order to improve the following deficits and impairments:  Decreased mobility, Decreased endurance, Pain, Postural dysfunction, Impaired UE functional use, Impaired flexibility, Increased fascial restricitons, Decreased strength, Decreased coordination, Decreased range of motion, Improper body mechanics  Visit Diagnosis: Stiffness of left shoulder, not elsewhere classified  Acute pain of left shoulder  Muscle weakness (generalized)     Problem List Patient Active Problem List   Diagnosis Date Noted  . 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 Chronic left shoulder pain 08/23/2018  . Chronic diarrhea 12/07/2014  . Segmental colitis with rectal bleeding ([facility] 09/12/2014  . Routine general medical examination at a health care facility 06/05/2014  . Overweight 06/13/2011  . ABNORMAL CHEST XRAY 09/30/2007  . Hyperlipidemia 02/17/2007  . Essential hypertension 01/08/2007  . Osteoarthritis 01/08/2007   CShelton SilvasPT, DPT CShelton Silvas12/05/2018, 3:12 PM  [place] [address] [place],  [address] Phone: [phone]   Fax:  [phone]  Name: JANNELLE NOTARO MRN: [id number] Date of Birth: [birth year]

## 2019-03-16 ENCOUNTER — Other Ambulatory Visit: Payer: Self-pay

## 2019-03-16 ENCOUNTER — Ambulatory Visit
Admission: RE | Admit: 2019-03-16 | Discharge: 2019-03-16 | Disposition: A | Discharge status: Home / Self Care | Payer: Medicare Other | Source: Ambulatory Visit | Attending: Orthopedic Surgery | Admitting: Orthopedic Surgery

## 2019-03-16 DIAGNOSIS — M542 Cervicalgia: Secondary | ICD-10-CM

## 2019-03-16 DIAGNOSIS — M4802 Spinal stenosis, cervical region: Secondary | ICD-10-CM | POA: Diagnosis not present

## 2019-03-18 ENCOUNTER — Other Ambulatory Visit: Payer: Self-pay

## 2019-03-18 ENCOUNTER — Ambulatory Visit: Payer: Medicare Other | Admitting: Physical Therapy

## 2019-03-18 ENCOUNTER — Encounter: Payer: Self-pay | Admitting: Physical Therapy

## 2019-03-18 DIAGNOSIS — M25612 Stiffness of left shoulder, not elsewhere classified: Secondary | ICD-10-CM

## 2019-03-18 DIAGNOSIS — M25661 Stiffness of right knee, not elsewhere classified: Secondary | ICD-10-CM | POA: Diagnosis not present

## 2019-03-18 DIAGNOSIS — M25512 Pain in left shoulder: Secondary | ICD-10-CM | POA: Diagnosis not present

## 2019-03-18 DIAGNOSIS — R262 Difficulty in walking, not elsewhere classified: Secondary | ICD-10-CM | POA: Diagnosis not present

## 2019-03-18 DIAGNOSIS — M6281 Muscle weakness (generalized): Secondary | ICD-10-CM

## 2019-03-18 DIAGNOSIS — M25572 Pain in left ankle and joints of left foot: Secondary | ICD-10-CM | POA: Diagnosis not present

## 2019-03-18 NOTE — Therapy (Signed)
[facility] [address] [place], [address] Phone: [phone]   Fax:  [phone]  Physical Therapy Treatment  Patient Details  Name: [name] MRN: [id number] Date of Birth: 05-Sep-1950 No data recorded  Encounter Date: 03/18/2019  PT End of Session - 03/18/19 1519    Visit Number  23    Number of Visits  29    Date for PT Re-Evaluation  04/08/19    PT Start Time  0315    PT Stop Time  0400    PT Time Calculation (min)  45 min    Activity Tolerance  Patient tolerated treatment well;No increased pain    Behavior During Therapy  WFL for tasks assessed/performed       Past Medical History:  Diagnosis Date  . Arthritis    back, fingers with joint pain and swelling.  chronic back pain  . Chronic back pain    arthritis   . Diverticulosis   . Elevated cholesterol    takes Niacin daily and Simvastatin  . GERD (gastroesophageal reflux disease) 06/2004   non-specific gastritis on EGD 06/2004  . Headache(784.0)    occasionally  . History of colon polyps 2004, 2009   2004:adenomatous. 2009 hyperplastic.   Marland Kitchen History of hiatal hernia   . Hypertension    takes Tenoretic and Lisinopril daily  . Mucoid cyst of joint 08/2013   right index finger  . Osteopenia 01/2014   T score -1.1 FRAX 14%/0.5%. Stable from prior DEXA  . PONV (postoperative nausea and vomiting)   . Rotator cuff arthropathy    Left  . Seasonal allergies   . Stroke ([facility]) 1998   x 2 - mild left-sided weakness  . Urge incontinence   . Uterine prolapse     Past Surgical History:  Procedure Laterality Date  . BACK SURGERY     x 2  . BELPHAROPTOSIS REPAIR Bilateral 12/14/2017  . CATARACT EXTRACTION    . CATARACT EXTRACTION Bilateral 10/2017  . COLONOSCOPY  2004, 2009, 2014  . FINGER SURGERY    . GUM SURGERY    . GYNECOLOGIC CRYOSURGERY    . HYSTEROSCOPY W/D&C  12/07/2010   with resection of endometrial polyp  . JOINT REPLACEMENT     . KNEE ARTHROSCOPY Left 2008  . lip biopsy     done at MD office Fri 11/22/13  . MASS EXCISION Right 08/15/2013   Procedure: RIGHT INDEX EXCISION MASS ;  Surgeon: Tennis Must, MD;  Location: [facility];  Service: Orthopedics;  Laterality: Right;  . NASAL SEPTUM SURGERY    . OOPHORECTOMY Right 2000  . REVERSE SHOULDER ARTHROPLASTY Left 12/11/2018  . REVERSE SHOULDER ARTHROPLASTY Left 12/11/2018   Procedure: LEFT REVERSE SHOULDER ARTHROPLASTY;  Surgeon: Meredith Pel, MD;  Location: [facility];  Service: Orthopedics;  Laterality: Left;  . SHOULDER ARTHROSCOPY WITH OPEN ROTATOR CUFF REPAIR AND DISTAL CLAVICLE ACROMINECTOMY Right 11/26/2013   Procedure: RIGHT SHOULDER ARTHROSCOPY WITH MINI OPEN ROTATOR CUFF REPAIR AND DISTAL CLAVICLE RESECTION, SUBACROMIAL DECOMPRESSION, POSSIBLE [facility] PATCH.;  Surgeon: [name], MD;  Location: [facility];  Service: Orthopedics;  Laterality: Right;  . TOTAL KNEE ARTHROPLASTY Right 03/21/2017  . TOTAL KNEE ARTHROPLASTY Right 03/21/2017   Procedure: RIGHT TOTAL KNEE ARTHROPLASTY;  Surgeon: [name], MD;  Location: [facility];  Service: Orthopedics;  Laterality: Right;  . TUBAL LIGATION      There were no vitals filed for this visit.  Subjective Assessment - 03/18/19 1516    Subjective  Patient reports some increased soreness following some weight training yesterday, laying on her side ABD, and supine flexion with 2# wts. Motion improving    Pertinent History  Pt is a 68 year old female s/p L reverse total shoulder 12/11/18. Reports she was immobilized in her sling for 2 weeks. After immobilization has been using passive motion machine for flexion. Has been doing AAROM flexion/abd/ER, bicep, shrugs, and forward punches with 1# with HHPT over the past week. MD Marlou Sa advised patient for out of sling, AAROM, PROM with no lifting of more than 5# until next followup with him (mid Oct). Pt reports only 2/10 at rest, 6/10 with motion. Pain is at the  medial upper arm and in the armpit. Pain is worse during the evening after moving it all day. Patient lives with her husband and her 4 dogs, which she walks frequently and cares for. She is R handed.Pt denies N/V, B&B changes, unexplained weight fluctuation, saddle paresthesia, fever, night sweats, or unrelenting night pain at this time.Pt denies N/V, B&B changes, unexplained weight fluctuation, saddle paresthesia, fever, night sweats, or unrelenting night pain at this time.    Limitations  House hold activities;Lifting    How long can you sit comfortably?  unlimited    How long can you stand comfortably?  unlimited    How long can you walk comfortably?  unlimited    Diagnostic tests  Xrays    Patient Stated Goals  Being able to complete daily tasks    Pain Onset  1 to 4 weeks ago           Shoulder motion at beginning of session Flex 120d abd 95d IR L PSIS ER L occiput  Ther-Ex -UBE L 1 resistance 25mn forward; 329m backward - AAROM IR with towel x15 5sec hold - RTB IR with towel under elbow 2x 10 - RTB ER with towel under elbow 2x 10 able to complete ROM slightly past neutral - Standing rows 10# 3x 10 with min cuing for posture and set up with good carry over following - Lat pulldown 10# 3x 10 with cuing for technique through initial set with good carry over - Scapular push on wall 2x 10  - Seated chest press 3# DB bilat UE 2x 8 with increased forearm fasciculation at elbow ext, good carry over of proper form - Seated military BUE 2# DB press 2x 8 to about 140d abd cuing to prevent shoulder hiking with good carry overw  HEP updated [id number] to include strengthening                       PT Education - 03/18/19 1519    Education Details  therex form    Person(s) Educated  Patient    Methods  Explanation;Demonstration;Verbal cues    Comprehension  Verbalized understanding;Returned demonstration;Verbal cues required       PT Short Term Goals -  02/13/19 1308      PT SHORT TERM GOAL #1   Title  Pt will be independent with HEP in order to improve strength and balance in order to improve function at home    Baseline  01/02/19    Time  4    Period  Weeks    Status  Achieved        PT Long Term Goals - 02/25/19 1313      PT LONG TERM GOAL #1  Title  Pt will decrease quick DASH score by at least 8% in order to demonstrate clinically significant reduction in disability.    Baseline  02/25/19 56.8%    Time  8    Period  Weeks    Status  Achieved      PT LONG TERM GOAL #2   Title  Patient will demonstrate full AROM in L shoulder in order to complete household ADLs    Baseline  01/02/19 74* Flexion, 58* ABDCT: 02/13/19: LUE shoulder fleixon 126*, abduction 95*; 02/25/19 120* Flexion, 95* ABDCT, IR  L PSIS, ER C7    Time  8    Period  Weeks    Status  On-going      PT LONG TERM GOAL #3   Title  Pt will decrease worst pain as reported on NPRS by at least 3 points in order to demonstrate clinically significant reduction in pain.    Baseline  02/25/19 2/10 pain with movement    Time  8    Period  Weeks    Status  Achieved      PT LONG TERM GOAL #4   Title  Pt will demonstrate gross bilat shoulder and periscapular strength of 4/10 in order to safely lift for heavy household chores    Baseline  02/25/19 R/L  shoulder flex: 4+/4 abd: 4+/4+ IR: 3+/4 ER: 3+/4 ext: Periscap Y 3+/3- T 4/4-  I 4+/4- 01/02/19 see eval    Time  8    Period  Weeks            Plan - 03/18/19 1532    Clinical Impression Statement  PT continued therex progression, into upright position as ROM allowed. Patient able to complete all therex with proper techniques following some demo and cuing with good carry over. Patinet with good motivation throughout session. PT will continue progression as able.    Personal Factors and Comorbidities  Age;Sex;Behavior Pattern;Comorbidity 1;Fitness    Comorbidities  HTN    Examination-Activity Limitations   Dressing;Carry;Reach Overhead;Lift    Examination-Participation Restrictions  Driving;Community Activity;Cleaning    Stability/Clinical Decision Making  Evolving/Moderate complexity    Clinical Decision Making  Moderate    Rehab Potential  Good    PT Frequency  2x / week    PT Duration  8 weeks    PT Treatment/Interventions  ADLs/Self Care Home Management;Electrical Stimulation;Therapeutic activities;Passive range of motion;Manual techniques;Patient/family education;Therapeutic exercise;Iontophoresis 68m/ml Dexamethasone;Moist Heat;Traction;Cryotherapy;Ultrasound;Functional mobility training;Neuromuscular re-education;Balance training;Dry needling;Joint Manipulations    PT Next Visit Plan  Continue to gently progress mobility and strength    PT Home Exercise Plan  [id number]    Consulted and Agree with Plan of Care  Patient       Patient will benefit from skilled therapeutic intervention in order to improve the following deficits and impairments:  Decreased mobility, Decreased endurance, Pain, Postural dysfunction, Impaired UE functional use, Impaired flexibility, Increased fascial restricitons, Decreased strength, Decreased coordination, Decreased range of motion, Improper body mechanics  Visit Diagnosis: Stiffness of left shoulder, not elsewhere classified  Acute pain of left shoulder  Muscle weakness (generalized)     Problem List Patient Active Problem List   Diagnosis Date Noted  . 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 Chronic left shoulder pain 08/23/2018  . Chronic diarrhea 12/07/2014  . Segmental colitis with rectal bleeding ([facility] 09/12/2014  . Routine general medical examination at a health care facility 06/05/2014  . Overweight 06/13/2011  . ABNORMAL CHEST XRAY 09/30/2007  . Hyperlipidemia 02/17/2007  .  Essential hypertension 01/08/2007  . Osteoarthritis 01/08/2007   Shelton Silvas PT, DPT Shelton Silvas 03/18/2019, 4:00 PM  [facility] [address] [place], [address] Phone: [phone]   Fax:  [phone]  Name: JASMIN WINBERRY MRN: [id number] Date of Birth: 02-Aug-1950

## 2019-03-20 ENCOUNTER — Other Ambulatory Visit: Payer: Self-pay

## 2019-03-20 ENCOUNTER — Ambulatory Visit: Payer: Medicare Other | Admitting: Physical Therapy

## 2019-03-20 ENCOUNTER — Encounter: Payer: Self-pay | Admitting: Physical Therapy

## 2019-03-20 ENCOUNTER — Encounter: Payer: Medicare Other | Admitting: Physical Therapy

## 2019-03-20 DIAGNOSIS — M25612 Stiffness of left shoulder, not elsewhere classified: Secondary | ICD-10-CM

## 2019-03-20 DIAGNOSIS — M6281 Muscle weakness (generalized): Secondary | ICD-10-CM | POA: Diagnosis not present

## 2019-03-20 DIAGNOSIS — M25572 Pain in left ankle and joints of left foot: Secondary | ICD-10-CM | POA: Diagnosis not present

## 2019-03-20 DIAGNOSIS — M25512 Pain in left shoulder: Secondary | ICD-10-CM | POA: Diagnosis not present

## 2019-03-20 DIAGNOSIS — M25661 Stiffness of right knee, not elsewhere classified: Secondary | ICD-10-CM | POA: Diagnosis not present

## 2019-03-20 DIAGNOSIS — R262 Difficulty in walking, not elsewhere classified: Secondary | ICD-10-CM | POA: Diagnosis not present

## 2019-03-20 NOTE — Therapy (Signed)
[facility] [address], [place], [address] Phone: [phone]   Fax:  [phone]  Physical Therapy Treatment  Patient Details  Name: [name] MRN: [id number] Date of Birth: [birth year] No data recorded  Encounter Date: 03/20/2019  PT End of Session - 03/20/19 1535    Visit Number  24    Number of Visits  29    Date for PT Re-Evaluation  04/08/19    PT Start Time  0330    PT Stop Time  8341    PT Time Calculation (min)  45 min    Activity Tolerance  Patient tolerated treatment well;No increased pain    Behavior During Therapy  WFL for tasks assessed/performed       Past Medical History:  Diagnosis Date  . Arthritis    back, fingers with joint pain and swelling.  chronic back pain  . Chronic back pain    arthritis   . Diverticulosis   . Elevated cholesterol    takes Niacin daily and Simvastatin  . GERD (gastroesophageal reflux disease) 06/2004   non-specific gastritis on EGD 06/2004  . Headache(784.0)    occasionally  . History of colon polyps 2004, 2009   2004:adenomatous. 2009 hyperplastic.   Marland Kitchen History of hiatal hernia   . Hypertension    takes Tenoretic and Lisinopril daily  . Mucoid cyst of joint 08/2013   right index finger  . Osteopenia 01/2014   T score -1.1 FRAX 14%/0.5%. Stable from prior DEXA  . PONV (postoperative nausea and vomiting)   . Rotator cuff arthropathy    Left  . Seasonal allergies   . Stroke ([place]) 1998   x 2 - mild left-sided weakness  . Urge incontinence   . Uterine prolapse     Past Surgical History:  Procedure Laterality Date  . BACK SURGERY     x 2  . BELPHAROPTOSIS REPAIR Bilateral 12/14/2017  . CATARACT EXTRACTION    . CATARACT EXTRACTION Bilateral 10/2017  . COLONOSCOPY  2004, 2009, 2014  . FINGER SURGERY    . GUM SURGERY    . GYNECOLOGIC CRYOSURGERY    . HYSTEROSCOPY W/D&C  12/07/2010   with resection of endometrial polyp  . JOINT REPLACEMENT     . KNEE ARTHROSCOPY Left 2008  . lip biopsy     done at MD office Fri 11/22/13  . MASS EXCISION Right 08/15/2013   Procedure: RIGHT INDEX EXCISION MASS ;  Surgeon: Tennis Must, MD;  Location: [facility];  Service: Orthopedics;  Laterality: Right;  . NASAL SEPTUM SURGERY    . OOPHORECTOMY Right 2000  . REVERSE SHOULDER ARTHROPLASTY Left 12/11/2018  . REVERSE SHOULDER ARTHROPLASTY Left 12/11/2018   Procedure: LEFT REVERSE SHOULDER ARTHROPLASTY;  Surgeon: Meredith Pel, MD;  Location: [facility];  Service: Orthopedics;  Laterality: Left;  . SHOULDER ARTHROSCOPY WITH OPEN ROTATOR CUFF REPAIR AND DISTAL CLAVICLE ACROMINECTOMY Right 11/26/2013   Procedure: RIGHT SHOULDER ARTHROSCOPY WITH MINI OPEN ROTATOR CUFF REPAIR AND DISTAL CLAVICLE RESECTION, SUBACROMIAL DECOMPRESSION, POSSIBLE [facility] PATCH.;  Surgeon: [name], MD;  Location: [facility];  Service: Orthopedics;  Laterality: Right;  . TOTAL KNEE ARTHROPLASTY Right 03/21/2017  . TOTAL KNEE ARTHROPLASTY Right 03/21/2017   Procedure: RIGHT TOTAL KNEE ARTHROPLASTY;  Surgeon: [name], MD;  Location: [facility];  Service: Orthopedics;  Laterality: Right;  . TUBAL LIGATION      There were no vitals filed for this visit.  Subjective Assessment - 03/20/19 1533    Subjective  Reports some soreness following weight training, but that she got a massage earlier today and feels a little better. 2/10 pian. Compliance with HEP.    Pertinent History  Pt is a 68 year old female s/p L reverse total shoulder 12/11/18. Reports she was immobilized in her sling for 2 weeks. After immobilization has been using passive motion machine for flexion. Has been doing AAROM flexion/abd/ER, bicep, shrugs, and forward punches with 1# with HHPT over the past week. MD Marlou Sa advised patient for out of sling, AAROM, PROM with no lifting of more than 5# until next followup with him (mid Oct). Pt reports only 2/10 at rest, 6/10 with motion. Pain is at the  medial upper arm and in the armpit. Pain is worse during the evening after moving it all day. Patient lives with her husband and her 4 dogs, which she walks frequently and cares for. She is R handed.Pt denies N/V, B&B changes, unexplained weight fluctuation, saddle paresthesia, fever, night sweats, or unrelenting night pain at this time.Pt denies N/V, B&B changes, unexplained weight fluctuation, saddle paresthesia, fever, night sweats, or unrelenting night pain at this time.    Limitations  House hold activities;Lifting    How long can you sit comfortably?  unlimited    How long can you stand comfortably?  unlimited    How long can you walk comfortably?  unlimited    Diagnostic tests  Xrays    Patient Stated Goals  Being able to complete daily tasks    Pain Onset  1 to 4 weeks ago      Shoulder motion at beginning of session Flex 117d abd 91d IR L PSIS ER L occiput  Ther-Ex -UBE L 1 resistance 75mn forward; 285m backward - Supine flexion 3# DB 0-152d 3# 2x10 with good control - Sidelying abd 3# DB 0-135d  - Sidleying ER <> IR 2# DB belly-neutral 2x 10 with min fasciculation in forearm near neutral - Seated military BUE 2# DB press 3x 8 to about 150d abd cuing for elbow ext with decent carry over - Wall plank lateral scapular taps 10x each; with YTB x10 with cuing for scapular retraction with good carry over - Lat pulldown 10# 3x 10 with good carry over of proper technique from previous session  - Standing rows 10# x10; 15# x10 with min cuing for for scapular retraction with good carry over                          PT Education - 03/20/19 1534    Education Details  therex form    Person(s) Educated  Patient    Methods  Explanation;Demonstration;Verbal cues    Comprehension  Verbalized understanding;Returned demonstration;Verbal cues required       PT Short Term Goals - 02/13/19 1308      PT SHORT TERM GOAL #1   Title  Pt will be independent with HEP in  order to improve strength and balance in order to improve function at home    Baseline  01/02/19    Time  4    Period  Weeks    Status  Achieved        PT Long Term Goals - 02/25/19 1313      PT LONG TERM GOAL #1   Title  Pt will decrease quick DASH score by at least 8% in order to demonstrate clinically significant reduction in disability.  Baseline  02/25/19 56.8%    Time  8    Period  Weeks    Status  Achieved      PT LONG TERM GOAL #2   Title  Patient will demonstrate full AROM in L shoulder in order to complete household ADLs    Baseline  01/02/19 74* Flexion, 58* ABDCT: 02/13/19: LUE shoulder fleixon 126*, abduction 95*; 02/25/19 120* Flexion, 95* ABDCT, IR  L PSIS, ER C7    Time  8    Period  Weeks    Status  On-going      PT LONG TERM GOAL #3   Title  Pt will decrease worst pain as reported on NPRS by at least 3 points in order to demonstrate clinically significant reduction in pain.    Baseline  02/25/19 2/10 pain with movement    Time  8    Period  Weeks    Status  Achieved      PT LONG TERM GOAL #4   Title  Pt will demonstrate gross bilat shoulder and periscapular strength of 4/10 in order to safely lift for heavy household chores    Baseline  02/25/19 R/L  shoulder flex: 4+/4 abd: 4+/4+ IR: 3+/4 ER: 3+/4 ext: Periscap Y 3+/3- T 4/4-  I 4+/4- 01/02/19 see eval    Time  8    Period  Weeks            Plan - 03/20/19 1539    Clinical Impression Statement  PT continued therex progression for increased shoulder strength and mobility with good success. Patient is able to complete all therex with proper technique following cuing. Patient with good motivation throughout session. PT will continue progression as able.    Personal Factors and Comorbidities  Age;Sex;Behavior Pattern;Comorbidity 1;Fitness    Comorbidities  HTN    Examination-Activity Limitations  Dressing;Carry;Reach Overhead;Lift    Examination-Participation Restrictions  Driving;Community  Activity;Cleaning    Stability/Clinical Decision Making  Evolving/Moderate complexity    Clinical Decision Making  Moderate    Rehab Potential  Good    PT Frequency  2x / week    PT Duration  8 weeks    PT Treatment/Interventions  ADLs/Self Care Home Management;Electrical Stimulation;Therapeutic activities;Passive range of motion;Manual techniques;Patient/family education;Therapeutic exercise;Iontophoresis 38m/ml Dexamethasone;Moist Heat;Traction;Cryotherapy;Ultrasound;Functional mobility training;Neuromuscular re-education;Balance training;Dry needling;Joint Manipulations    PT Next Visit Plan  Continue to gently progress mobility and strength    PT Home Exercise Plan  [id number]    Consulted and Agree with Plan of Care  Patient       Patient will benefit from skilled therapeutic intervention in order to improve the following deficits and impairments:  Decreased mobility, Decreased endurance, Pain, Postural dysfunction, Impaired UE functional use, Impaired flexibility, Increased fascial restricitons, Decreased strength, Decreased coordination, Decreased range of motion, Improper body mechanics  Visit Diagnosis: Stiffness of left shoulder, not elsewhere classified  Acute pain of left shoulder  Muscle weakness (generalized)     Problem List Patient Active Problem List   Diagnosis Date Noted  . 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 Chronic left shoulder pain 08/23/2018  . Chronic diarrhea 12/07/2014  . Segmental colitis with rectal bleeding ([facility] 09/12/2014  . Routine general medical examination at a health care facility 06/05/2014  . Overweight 06/13/2011  . ABNORMAL CHEST XRAY 09/30/2007  . Hyperlipidemia 02/17/2007  . Essential hypertension 01/08/2007  . Osteoarthritis 01/08/2007   CShelton SilvasPT, DPT CShelton Silvas12/12/2018, 4:09 PM  [facility] [address] [place],  [address] Phone: [phone]   Fax:  [phone]  Name: [name] MRN: [id number] Date of Birth: 01/11/1951

## 2019-03-22 ENCOUNTER — Ambulatory Visit (INDEPENDENT_AMBULATORY_CARE_PROVIDER_SITE_OTHER): Payer: Medicare Other | Admitting: Orthopedic Surgery

## 2019-03-22 ENCOUNTER — Other Ambulatory Visit: Payer: Self-pay | Admitting: Internal Medicine

## 2019-03-22 ENCOUNTER — Other Ambulatory Visit: Payer: Self-pay

## 2019-03-22 ENCOUNTER — Encounter: Payer: Self-pay | Admitting: Orthopedic Surgery

## 2019-03-22 DIAGNOSIS — R200 Anesthesia of skin: Secondary | ICD-10-CM | POA: Diagnosis not present

## 2019-03-22 DIAGNOSIS — M4802 Spinal stenosis, cervical region: Secondary | ICD-10-CM | POA: Diagnosis not present

## 2019-03-22 NOTE — Progress Notes (Signed)
Office Visit Note   Patient: [name]           Date of Birth: Jan 21, 1951           MRN: [id number] Visit Date: 03/22/2019 Requested by: Hoyt Koch, MD [place],  [place] [id number] PCP: Hoyt Koch, MD  Subjective: Chief Complaint  Patient presents with  . Follow-up    HPI: [name] is a 68 y.o. female who presents to the office complaining of neck pain.  She returns to discuss MRI results.  She notes continued neck pain and soreness that wakes her up at night.  Patient states that this pain has been ongoing for a while but worse in the last 3 to 6 months.  She also notes bilateral hand numbness and tingling, worse in the median nerve distribution but affecting all 10 of her fingers overall.  She denies any subjective weakness, radicular pain.  She takes occasional Tylenol and turmeric for her pain.  She has not been taking any blood thinners.  She has never had previous epidural steroid injections before.  She denies any numbness throughout the rest of her upper extremities bilaterally.  She has wrist splints at home for this problem which she has had about 10 years ago.  She will try to find them.  We offered her more wrist splints today but she wants to find the one she has at home.              ROS:  All systems reviewed are negative as they relate to the chief complaint within the history of present illness.  Patient denies fevers or chills.  Assessment & Plan: Visit Diagnoses:  1. Foraminal stenosis of cervical region   2. Numbness in both hands     Plan: Patient is a 68 year old female who presents planing of neck pain and bilateral hand numbness and tingling.  MRI was reviewed which reveals bilateral foraminal narrowing at C4-C5, C5-C6, C6-C7.  Referred patient to Dr. Laurence Spates for epidural steroid injections.  Patient will also be referred for nerve conduction study of her bilateral upper extremities to evaluate for carpal  tunnel syndrome bilaterally.  Also recommended patient try using wrist splints to see if that improves her numbness/tingling symptoms in her hands.  Patient agreed with the plan and will follow up after nerve conduction study to review results.  In general it looks like [name]'s problems may be coming more from carpal tunnel syndrome in the hands as opposed to radicular symptoms from the neck.  She is having less whole arm symptoms today by history and more palmar sided hand numbness.  Follow-Up Instructions: No follow-ups on file.   Orders:  Orders Placed This Encounter  Procedures  . Ambulatory referral to Physical Medicine Rehab   No orders of the defined types were placed in this encounter.     Procedures: No procedures performed   Clinical Data: No additional findings.  Objective: Vital Signs: LMP 02/09/2005   Physical Exam:  Constitutional: Patient appears well-developed HEENT:  Head: Normocephalic Eyes:EOM are normal Neck: Normal range of motion Cardiovascular: Normal rate Pulmonary/chest: Effort normal Neurologic: Patient is alert Skin: Skin is warm Psychiatric: Patient has normal mood and affect  Ortho Exam:  5/5 motor strength of the bilateral upper extremities including grip, wrist supination/pronation, bicep, tricep, deltoid No significant loss of sensation on exam today throughout her upper extremities including the dorsal and palmar aspect of her bilateral hands.  Mild TTP throughout the axial cervical spine.  No signs of hyper or hyporeflexia   Specialty Comments:  No specialty comments available.  Imaging: No results found.   PMFS History: Patient Active Problem List   Diagnosis Date Noted  . 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 Chronic left shoulder pain 08/23/2018  . Chronic diarrhea 12/07/2014  . Segmental colitis with rectal bleeding ([place]) 09/12/2014  . Routine general medical examination at a health care facility 06/05/2014  .  Overweight 06/13/2011  . ABNORMAL CHEST XRAY 09/30/2007  . Hyperlipidemia 02/17/2007  . Essential hypertension 01/08/2007  . Osteoarthritis 01/08/2007   Past Medical History:  Diagnosis Date  . Arthritis    back, fingers with joint pain and swelling.  chronic back pain  . Chronic back pain    arthritis   . Diverticulosis   . Elevated cholesterol    takes Niacin daily and Simvastatin  . GERD (gastroesophageal reflux disease) 06/2004   non-specific gastritis on EGD 06/2004  . Headache(784.0)    occasionally  . History of colon polyps 2004, 2009   2004:adenomatous. 2009 hyperplastic.   Marland Kitchen History of hiatal hernia   . Hypertension    takes Tenoretic and Lisinopril daily  . Mucoid cyst of joint 08/2013   right index finger  . Osteopenia 01/2014   T score -1.1 FRAX 14%/0.5%. Stable from prior DEXA  . PONV (postoperative nausea and vomiting)   . Rotator cuff arthropathy    Left  . Seasonal allergies   . Stroke ([place]) 1998   x 2 - mild left-sided weakness  . Urge incontinence   . Uterine prolapse     Family History  Problem Relation Age of Onset  . Hypertension Mother   . Heart disease Mother   . Diabetes Father   . Hypertension Father   . Heart disease Father   . Stroke Father   . Diabetes Maternal Aunt   . Diabetes Paternal Grandmother   . Hypertension Paternal Grandfather   . Stroke Paternal Grandfather     Past Surgical History:  Procedure Laterality Date  . BACK SURGERY     x 2  . BELPHAROPTOSIS REPAIR Bilateral 12/14/2017  . CATARACT EXTRACTION    . CATARACT EXTRACTION Bilateral 10/2017  . COLONOSCOPY  2004, 2009, 2014  . FINGER SURGERY    . GUM SURGERY    . GYNECOLOGIC CRYOSURGERY    . HYSTEROSCOPY W/D&C  12/07/2010   with resection of endometrial polyp  . JOINT REPLACEMENT    . KNEE ARTHROSCOPY Left 2008  . lip biopsy     done at MD office Fri 11/22/13  . MASS EXCISION Right 08/15/2013   Procedure: RIGHT INDEX EXCISION MASS ;  Surgeon: Tennis Must, MD;   Location: [facility];  Service: Orthopedics;  Laterality: Right;  . NASAL SEPTUM SURGERY    . OOPHORECTOMY Right 2000  . REVERSE SHOULDER ARTHROPLASTY Left 12/11/2018  . REVERSE SHOULDER ARTHROPLASTY Left 12/11/2018   Procedure: LEFT REVERSE SHOULDER ARTHROPLASTY;  Surgeon: Meredith Pel, MD;  Location: [facility];  Service: Orthopedics;  Laterality: Left;  . SHOULDER ARTHROSCOPY WITH OPEN ROTATOR CUFF REPAIR AND DISTAL CLAVICLE ACROMINECTOMY Right 11/26/2013   Procedure: RIGHT SHOULDER ARTHROSCOPY WITH MINI OPEN ROTATOR CUFF REPAIR AND DISTAL CLAVICLE RESECTION, SUBACROMIAL DECOMPRESSION, POSSIBLE [facility] PATCH.;  Surgeon: [name], MD;  Location: [facility];  Service: Orthopedics;  Laterality: Right;  . TOTAL KNEE ARTHROPLASTY Right 03/21/2017  . TOTAL KNEE ARTHROPLASTY Right 03/21/2017  Procedure: RIGHT TOTAL KNEE ARTHROPLASTY;  Surgeon: [name], MD;  Location: [facility];  Service: Orthopedics;  Laterality: Right;  . TUBAL LIGATION     Social History   Occupational History  . Occupation: Animal nutritionist    Comment: Disability/Retired  Tobacco Use  . Smoking status: Passive Smoke Exposure - Never Smoker  . Smokeless tobacco: Never Used  Substance and Sexual Activity  . Alcohol use: Not Currently    Alcohol/week: 0.0 standard drinks    Comment: about 2-3 times a year  . Drug use: No  . Sexual activity: Yes    Partners: Male    Birth control/protection: Surgical, Post-menopausal    Comment: BTL-1st intercourse 68 yo-More than 5 partners

## 2019-03-25 ENCOUNTER — Encounter: Payer: Self-pay | Admitting: Physical Therapy

## 2019-03-25 ENCOUNTER — Other Ambulatory Visit: Payer: Self-pay

## 2019-03-25 ENCOUNTER — Ambulatory Visit: Payer: Medicare Other | Admitting: Physical Therapy

## 2019-03-25 DIAGNOSIS — M25572 Pain in left ankle and joints of left foot: Secondary | ICD-10-CM | POA: Diagnosis not present

## 2019-03-25 DIAGNOSIS — M6281 Muscle weakness (generalized): Secondary | ICD-10-CM

## 2019-03-25 DIAGNOSIS — R262 Difficulty in walking, not elsewhere classified: Secondary | ICD-10-CM | POA: Diagnosis not present

## 2019-03-25 DIAGNOSIS — M25612 Stiffness of left shoulder, not elsewhere classified: Secondary | ICD-10-CM

## 2019-03-25 DIAGNOSIS — M25661 Stiffness of right knee, not elsewhere classified: Secondary | ICD-10-CM | POA: Diagnosis not present

## 2019-03-25 DIAGNOSIS — M25512 Pain in left shoulder: Secondary | ICD-10-CM

## 2019-03-25 NOTE — Therapy (Signed)
[facility] [address] [place], [address] Phone: [phone]   Fax:  [phone]  Physical Therapy Treatment  Patient Details  Name: [name] MRN: [id number] Date of Birth: Mar 18, 1951 No data recorded  Encounter Date: 03/25/2019  PT End of Session - 03/25/19 1523    Visit Number  25    Number of Visits  29    Date for PT Re-Evaluation  04/08/19    PT Start Time  0315    PT Stop Time  0400    PT Time Calculation (min)  45 min    Activity Tolerance  Patient tolerated treatment well;No increased pain    Behavior During Therapy  WFL for tasks assessed/performed       Past Medical History:  Diagnosis Date  . Arthritis    back, fingers with joint pain and swelling.  chronic back pain  . Chronic back pain    arthritis   . Diverticulosis   . Elevated cholesterol    takes Niacin daily and Simvastatin  . GERD (gastroesophageal reflux disease) 06/2004   non-specific gastritis on EGD 06/2004  . Headache(784.0)    occasionally  . History of colon polyps 2004, 2009   2004:adenomatous. 2009 hyperplastic.   Marland Kitchen History of hiatal hernia   . Hypertension    takes Tenoretic and Lisinopril daily  . Mucoid cyst of joint 08/2013   right index finger  . Osteopenia 01/2014   T score -1.1 FRAX 14%/0.5%. Stable from prior DEXA  . PONV (postoperative nausea and vomiting)   . Rotator cuff arthropathy    Left  . Seasonal allergies   . Stroke ([place]) 1998   x 2 - mild left-sided weakness  . Urge incontinence   . Uterine prolapse     Past Surgical History:  Procedure Laterality Date  . BACK SURGERY     x 2  . BELPHAROPTOSIS REPAIR Bilateral 12/14/2017  . CATARACT EXTRACTION    . CATARACT EXTRACTION Bilateral 10/2017  . COLONOSCOPY  2004, 2009, 2014  . FINGER SURGERY    . GUM SURGERY    . GYNECOLOGIC CRYOSURGERY    . HYSTEROSCOPY W/D&C  12/07/2010   with resection of endometrial polyp  . JOINT  REPLACEMENT    . KNEE ARTHROSCOPY Left 2008  . lip biopsy     done at MD office Fri 11/22/13  . MASS EXCISION Right 08/15/2013   Procedure: RIGHT INDEX EXCISION MASS ;  Surgeon: Tennis Must, MD;  Location: [facility];  Service: Orthopedics;  Laterality: Right;  . NASAL SEPTUM SURGERY    . OOPHORECTOMY Right 2000  . REVERSE SHOULDER ARTHROPLASTY Left 12/11/2018  . REVERSE SHOULDER ARTHROPLASTY Left 12/11/2018   Procedure: LEFT REVERSE SHOULDER ARTHROPLASTY;  Surgeon: Meredith Pel, MD;  Location: [facility];  Service: Orthopedics;  Laterality: Left;  . SHOULDER ARTHROSCOPY WITH OPEN ROTATOR CUFF REPAIR AND DISTAL CLAVICLE ACROMINECTOMY Right 11/26/2013   Procedure: RIGHT SHOULDER ARTHROSCOPY WITH MINI OPEN ROTATOR CUFF REPAIR AND DISTAL CLAVICLE RESECTION, SUBACROMIAL DECOMPRESSION, POSSIBLE [facility] PATCH.;  Surgeon: [name], MD;  Location: [facility];  Service: Orthopedics;  Laterality: Right;  . TOTAL KNEE ARTHROPLASTY Right 03/21/2017  . TOTAL KNEE ARTHROPLASTY Right 03/21/2017   Procedure: RIGHT TOTAL KNEE ARTHROPLASTY;  Surgeon: [name], MD;  Location: [facility];  Service: Orthopedics;  Laterality: Right;  . TUBAL LIGATION      There were no vitals filed for this visit.  Subjective Assessment - 03/25/19 1517    Subjective  Patient reports some increased bilat hand numbness from her neck that she is scheduled to have a coritsone shot for.    Pertinent History  Pt is a 68 year old female s/p L reverse total shoulder 12/11/18. Reports she was immobilized in her sling for 2 weeks. After immobilization has been using passive motion machine for flexion. Has been doing AAROM flexion/abd/ER, bicep, shrugs, and forward punches with 1# with HHPT over the past week. MD Marlou Sa advised patient for out of sling, AAROM, PROM with no lifting of more than 5# until next followup with him (mid Oct). Pt reports only 2/10 at rest, 6/10 with motion. Pain is at the medial upper arm and in  the armpit. Pain is worse during the evening after moving it all day. Patient lives with her husband and her 4 dogs, which she walks frequently and cares for. She is R handed.Pt denies N/V, B&B changes, unexplained weight fluctuation, saddle paresthesia, fever, night sweats, or unrelenting night pain at this time.Pt denies N/V, B&B changes, unexplained weight fluctuation, saddle paresthesia, fever, night sweats, or unrelenting night pain at this time.    Limitations  House hold activities;Lifting    How long can you sit comfortably?  unlimited    How long can you stand comfortably?  unlimited    How long can you walk comfortably?  unlimited    Diagnostic tests  Xrays    Patient Stated Goals  Being able to complete daily tasks    Pain Onset  1 to 4 weeks ago       Shoulder motion at beginning of session Flex 120d abd 104d IR L PSIS ER C3  Ther-Ex -UBE L 1 resistance87mn forward;232m backward - Short lever scaption x10 w/ 1# DB 2x 10 with decreased height on last 2 reps of resisted sets  - Standing flexion 2# DB 0-90d 2x10 with good control - Sidelying abd 3# DB 0-135d with good control - Sidleying ER <> IR 3# DB belly-neutral 3x 10 without fasciculation  - Bent over rows 15# 3x 10 with demo and TC for posture initially with good carry over - Lat pulldown 15# 3x 10 with good carry over of proper technique from previous session  - Standing rows 15# x10 with min cuing for for scapular retraction with good carry over                           PT Education - 03/25/19 1518    Education Details  therex form    Person(s) Educated  Patient    Methods  Explanation;Demonstration;Verbal cues    Comprehension  Verbalized understanding;Returned demonstration;Verbal cues required       PT Short Term Goals - 02/13/19 1308      PT SHORT TERM GOAL #1   Title  Pt will be independent with HEP in order to improve strength and balance in order to improve function at home     Baseline  01/02/19    Time  4    Period  Weeks    Status  Achieved        PT Long Term Goals - 02/25/19 1313      PT LONG TERM GOAL #1   Title  Pt will decrease quick DASH score by at least 8% in order to demonstrate clinically significant reduction in disability.    Baseline  02/25/19 56.8%    Time  8    Period  Weeks    Status  Achieved      PT LONG TERM GOAL #2   Title  Patient will demonstrate full AROM in L shoulder in order to complete household ADLs    Baseline  01/02/19 74* Flexion, 58* ABDCT: 02/13/19: LUE shoulder fleixon 126*, abduction 95*; 02/25/19 120* Flexion, 95* ABDCT, IR  L PSIS, ER C7    Time  8    Period  Weeks    Status  On-going      PT LONG TERM GOAL #3   Title  Pt will decrease worst pain as reported on NPRS by at least 3 points in order to demonstrate clinically significant reduction in pain.    Baseline  02/25/19 2/10 pain with movement    Time  8    Period  Weeks    Status  Achieved      PT LONG TERM GOAL #4   Title  Pt will demonstrate gross bilat shoulder and periscapular strength of 4/10 in order to safely lift for heavy household chores    Baseline  02/25/19 R/L  shoulder flex: 4+/4 abd: 4+/4+ IR: 3+/4 ER: 3+/4 ext: Periscap Y 3+/3- T 4/4-  I 4+/4- 01/02/19 see eval    Time  8    Period  Weeks            Plan - 03/25/19 1559    Clinical Impression Statement  PT continued therex porgression for increased shoulder/periscapular strength with good success. Patient continues to respond well to cuing for proper technique/muscle activation. Patient is able to complete all therex with proper technique, some modifications made, without increased pain. PT will continue progression as able.    Personal Factors and Comorbidities  Age;Sex;Behavior Pattern;Comorbidity 1;Fitness    Comorbidities  HTN    Examination-Activity Limitations  Dressing;Carry;Reach Overhead;Lift    Examination-Participation Restrictions  Driving;Community Activity;Cleaning     Stability/Clinical Decision Making  Evolving/Moderate complexity    Clinical Decision Making  Moderate    Rehab Potential  Good    PT Frequency  2x / week    PT Duration  8 weeks    PT Treatment/Interventions  ADLs/Self Care Home Management;Electrical Stimulation;Therapeutic activities;Passive range of motion;Manual techniques;Patient/family education;Therapeutic exercise;Iontophoresis 72m/ml Dexamethasone;Moist Heat;Traction;Cryotherapy;Ultrasound;Functional mobility training;Neuromuscular re-education;Balance training;Dry needling;Joint Manipulations    PT Next Visit Plan  Continue to gently progress mobility and strength    PT Home Exercise Plan  [id number]    Consulted and Agree with Plan of Care  Patient       Patient will benefit from skilled therapeutic intervention in order to improve the following deficits and impairments:  Decreased mobility, Decreased endurance, Pain, Postural dysfunction, Impaired UE functional use, Impaired flexibility, Increased fascial restricitons, Decreased strength, Decreased coordination, Decreased range of motion, Improper body mechanics  Visit Diagnosis: Stiffness of left shoulder, not elsewhere classified  Acute pain of left shoulder  Muscle weakness (generalized)     Problem List Patient Active Problem List   Diagnosis Date Noted  . 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 Chronic left shoulder pain 08/23/2018  . Chronic diarrhea 12/07/2014  . [place] 09/12/2014  . Routine general medical examination at a health care facility 06/05/2014  . Overweight 06/13/2011  . ABNORMAL CHEST XRAY 09/30/2007  . Hyperlipidemia 02/17/2007  . Essential hypertension 01/08/2007  . Osteoarthritis 01/08/2007   CShelton SilvasPT, DPT CShelton Silvas12/14/2020, 4:05 PM  [facility] [address], [place], [address] Phone: [phone]    Fax:  [phone]  Name: BAYLA MCGOVERN MRN: [id number] Date of Birth: [birth year]

## 2019-03-27 ENCOUNTER — Other Ambulatory Visit: Payer: Self-pay

## 2019-03-27 ENCOUNTER — Ambulatory Visit: Payer: Medicare Other | Admitting: Physical Therapy

## 2019-03-27 ENCOUNTER — Encounter: Payer: Self-pay | Admitting: Physical Therapy

## 2019-03-27 DIAGNOSIS — M25572 Pain in left ankle and joints of left foot: Secondary | ICD-10-CM | POA: Diagnosis not present

## 2019-03-27 DIAGNOSIS — M25612 Stiffness of left shoulder, not elsewhere classified: Secondary | ICD-10-CM

## 2019-03-27 DIAGNOSIS — M25661 Stiffness of right knee, not elsewhere classified: Secondary | ICD-10-CM | POA: Diagnosis not present

## 2019-03-27 DIAGNOSIS — M6281 Muscle weakness (generalized): Secondary | ICD-10-CM | POA: Diagnosis not present

## 2019-03-27 DIAGNOSIS — M25512 Pain in left shoulder: Secondary | ICD-10-CM | POA: Diagnosis not present

## 2019-03-27 DIAGNOSIS — R262 Difficulty in walking, not elsewhere classified: Secondary | ICD-10-CM | POA: Diagnosis not present

## 2019-03-27 NOTE — Addendum Note (Signed)
Addended by: Shelton Silvas on: 03/27/2019 12:48 PM   Modules accepted: Orders

## 2019-03-27 NOTE — Therapy (Signed)
[facility] [address], [place], [address] Phone: [phone]   Fax:  [phone]  Physical Therapy Treatment  Patient Details  Name: [name] MRN: [id number] Date of Birth: March 11, 1951 No data recorded  Encounter Date: 03/27/2019  PT End of Session - 03/27/19 1358    Visit Number  26    Number of Visits  29    Date for PT Re-Evaluation  04/08/19    Authorization Type  medicare    Authorization - Visit Number  9    Authorization - Number of Visits  10    PT Start Time  0145    PT Stop Time  0230    PT Time Calculation (min)  45 min    Activity Tolerance  Patient tolerated treatment well;No increased pain    Behavior During Therapy  WFL for tasks assessed/performed       Past Medical History:  Diagnosis Date  . Arthritis    back, fingers with joint pain and swelling.  chronic back pain  . Chronic back pain    arthritis   . Diverticulosis   . Elevated cholesterol    takes Niacin daily and Simvastatin  . GERD (gastroesophageal reflux disease) 06/2004   non-specific gastritis on EGD 06/2004  . Headache(784.0)    occasionally  . History of colon polyps 2004, 2009   2004:adenomatous. 2009 hyperplastic.   Marland Kitchen History of hiatal hernia   . Hypertension    takes Tenoretic and Lisinopril daily  . Mucoid cyst of joint 08/2013   right index finger  . Osteopenia 01/2014   T score -1.1 FRAX 14%/0.5%. Stable from prior DEXA  . PONV (postoperative nausea and vomiting)   . Rotator cuff arthropathy    Left  . Seasonal allergies   . Stroke ([place]) 1998   x 2 - mild left-sided weakness  . Urge incontinence   . Uterine prolapse     Past Surgical History:  Procedure Laterality Date  . BACK SURGERY     x 2  . BELPHAROPTOSIS REPAIR Bilateral 12/14/2017  . CATARACT EXTRACTION    . CATARACT EXTRACTION Bilateral 10/2017  . COLONOSCOPY  2004, 2009, 2014  . FINGER SURGERY    . GUM SURGERY    .  GYNECOLOGIC CRYOSURGERY    . HYSTEROSCOPY W/D&C  12/07/2010   with resection of endometrial polyp  . JOINT REPLACEMENT    . KNEE ARTHROSCOPY Left 2008  . lip biopsy     done at MD office Fri 11/22/13  . MASS EXCISION Right 08/15/2013   Procedure: RIGHT INDEX EXCISION MASS ;  Surgeon: Tennis Must, MD;  Location: [facility];  Service: Orthopedics;  Laterality: Right;  . NASAL SEPTUM SURGERY    . OOPHORECTOMY Right 2000  . REVERSE SHOULDER ARTHROPLASTY Left 12/11/2018  . REVERSE SHOULDER ARTHROPLASTY Left 12/11/2018   Procedure: LEFT REVERSE SHOULDER ARTHROPLASTY;  Surgeon: Meredith Pel, MD;  Location: [facility];  Service: Orthopedics;  Laterality: Left;  . SHOULDER ARTHROSCOPY WITH OPEN ROTATOR CUFF REPAIR AND DISTAL CLAVICLE ACROMINECTOMY Right 11/26/2013   Procedure: RIGHT SHOULDER ARTHROSCOPY WITH MINI OPEN ROTATOR CUFF REPAIR AND DISTAL CLAVICLE RESECTION, SUBACROMIAL DECOMPRESSION, POSSIBLE [facility] PATCH.;  Surgeon: [name], MD;  Location: [facility];  Service: Orthopedics;  Laterality: Right;  . TOTAL KNEE ARTHROPLASTY Right 03/21/2017  . TOTAL KNEE ARTHROPLASTY Right 03/21/2017   Procedure: RIGHT TOTAL KNEE ARTHROPLASTY;  Surgeon: [name], MD;  Location: [facility];  Service: Orthopedics;  Laterality: Right;  . TUBAL LIGATION      There were no vitals filed for this visit.  Subjective Assessment - 03/27/19 1349    Subjective  Reports some soreness this am. Reports 3/10 pain with motion.    Pertinent History  Pt is a 68 year old female s/p L reverse total shoulder 12/11/18. Reports she was immobilized in her sling for 2 weeks. After immobilization has been using passive motion machine for flexion. Has been doing AAROM flexion/abd/ER, bicep, shrugs, and forward punches with 1# with HHPT over the past week. MD Marlou Sa advised patient for out of sling, AAROM, PROM with no lifting of more than 5# until next followup with him (mid Oct). Pt reports only 2/10 at rest, 6/10  with motion. Pain is at the medial upper arm and in the armpit. Pain is worse during the evening after moving it all day. Patient lives with her husband and her 4 dogs, which she walks frequently and cares for. She is R handed.Pt denies N/V, B&B changes, unexplained weight fluctuation, saddle paresthesia, fever, night sweats, or unrelenting night pain at this time.Pt denies N/V, B&B changes, unexplained weight fluctuation, saddle paresthesia, fever, night sweats, or unrelenting night pain at this time.    Limitations  House hold activities;Lifting    How long can you sit comfortably?  unlimited    How long can you stand comfortably?  unlimited    How long can you walk comfortably?  unlimited    Diagnostic tests  Xrays    Patient Stated Goals  Being able to complete daily tasks       Shoulder motion at beginning of session Flex 127d abd 100d IR L PSIS ER C3  Ther-Ex -UBE L 1 resistance54mn forward;264m backward - Sleeper stretch 2x 30sec with education on increasing IR for behind the back motion - Sidleying ER <> IR 3# DB belly-neutral x10; 2# 2x 10  without fasciculation  - Sidelying abd 3# DB 0-135d 3x 10  with good control - Standing shoulder ext with focus on scapular retraction + depression GTB 3x 10  -Standing flexion 2# DB 0-90d 3x10 with good control - Bent over rows bilat 3# 3x 10 with min cuing for posture set up with good carry over following                        PT Education - 03/27/19 1357    Education Details  therex form    Person(s) Educated  Patient    Methods  Explanation;Demonstration;Verbal cues    Comprehension  Verbalized understanding;Returned demonstration;Verbal cues required       PT Short Term Goals - 02/13/19 1308      PT SHORT TERM GOAL #1   Title  Pt will be independent with HEP in order to improve strength and balance in order to improve function at home    Baseline  01/02/19    Time  4    Period  Weeks    Status   Achieved        PT Long Term Goals - 02/25/19 1313      PT LONG TERM GOAL #1   Title  Pt will decrease quick DASH score by at least 8% in order to demonstrate clinically significant reduction in disability.    Baseline  02/25/19 56.8%    Time  8    Period  Weeks    Status  Achieved  PT LONG TERM GOAL #2   Title  Patient will demonstrate full AROM in L shoulder in order to complete household ADLs    Baseline  01/02/19 74* Flexion, 58* ABDCT: 02/13/19: LUE shoulder fleixon 126*, abduction 95*; 02/25/19 120* Flexion, 95* ABDCT, IR  L PSIS, ER C7    Time  8    Period  Weeks    Status  On-going      PT LONG TERM GOAL #3   Title  Pt will decrease worst pain as reported on NPRS by at least 3 points in order to demonstrate clinically significant reduction in pain.    Baseline  02/25/19 2/10 pain with movement    Time  8    Period  Weeks    Status  Achieved      PT LONG TERM GOAL #4   Title  Pt will demonstrate gross bilat shoulder and periscapular strength of 4/10 in order to safely lift for heavy household chores    Baseline  02/25/19 R/L  shoulder flex: 4+/4 abd: 4+/4+ IR: 3+/4 ER: 3+/4 ext: Periscap Y 3+/3- T 4/4-  I 4+/4- 01/02/19 see eval    Time  8    Period  Weeks            Plan - 03/27/19 1409    Clinical Impression Statement  PT continued therex progression for increased motion and strength tihs session. Paitnet able to complete all therex with proper technique following cuing and demonstration. Patient with continued difficulty with IR with active reaching behind the back motion. PT added sleeper stretch to HEP to continue addressing this over the weekend. PT will continue progression as able.    Personal Factors and Comorbidities  Age;Sex;Behavior Pattern;Comorbidity 1;Fitness    Comorbidities  HTN    Examination-Activity Limitations  Dressing;Carry;Reach Overhead;Lift    Examination-Participation Restrictions  Driving;Community Activity;Cleaning     Stability/Clinical Decision Making  Evolving/Moderate complexity    Clinical Decision Making  Moderate    Rehab Potential  Good    PT Frequency  2x / week    PT Duration  8 weeks    PT Treatment/Interventions  ADLs/Self Care Home Management;Electrical Stimulation;Therapeutic activities;Passive range of motion;Manual techniques;Patient/family education;Therapeutic exercise;Iontophoresis 57m/ml Dexamethasone;Moist Heat;Traction;Cryotherapy;Ultrasound;Functional mobility training;Neuromuscular re-education;Balance training;Dry needling;Joint Manipulations    PT Next Visit Plan  Continue to gently progress mobility and strength    PT Home Exercise Plan  [id number]    Consulted and Agree with Plan of Care  Patient       Patient will benefit from skilled therapeutic intervention in order to improve the following deficits and impairments:  Decreased mobility, Decreased endurance, Pain, Postural dysfunction, Impaired UE functional use, Impaired flexibility, Increased fascial restricitons, Decreased strength, Decreased coordination, Decreased range of motion, Improper body mechanics  Visit Diagnosis: Stiffness of left shoulder, not elsewhere classified  Acute pain of left shoulder  Muscle weakness (generalized)     Problem List Patient Active Problem List   Diagnosis Date Noted  . 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 Chronic left shoulder pain 08/23/2018  . Chronic diarrhea 12/07/2014  . Segmental [place] 09/12/2014  . Routine general medical examination at a health care facility 06/05/2014  . Overweight 06/13/2011  . ABNORMAL CHEST XRAY 09/30/2007  . Hyperlipidemia 02/17/2007  . Essential hypertension 01/08/2007  . Osteoarthritis 01/08/2007   CShelton SilvasPT, DPT CShelton Silvas12/16/2020, 2:28 PM  [facility] [address]  [place], [place], [address] Phone: [phone]    Fax:  [phone]  Name: TINIKA BUCKNAM MRN: [id number] Date of Birth: January 09, 1951

## 2019-04-01 ENCOUNTER — Other Ambulatory Visit: Payer: Self-pay

## 2019-04-01 ENCOUNTER — Ambulatory Visit: Payer: Medicare Other | Admitting: Physical Therapy

## 2019-04-01 ENCOUNTER — Encounter: Payer: Self-pay | Admitting: Physical Therapy

## 2019-04-01 DIAGNOSIS — M25661 Stiffness of right knee, not elsewhere classified: Secondary | ICD-10-CM | POA: Diagnosis not present

## 2019-04-01 DIAGNOSIS — R262 Difficulty in walking, not elsewhere classified: Secondary | ICD-10-CM | POA: Diagnosis not present

## 2019-04-01 DIAGNOSIS — M25612 Stiffness of left shoulder, not elsewhere classified: Secondary | ICD-10-CM | POA: Diagnosis not present

## 2019-04-01 DIAGNOSIS — M25512 Pain in left shoulder: Secondary | ICD-10-CM | POA: Diagnosis not present

## 2019-04-01 DIAGNOSIS — M25572 Pain in left ankle and joints of left foot: Secondary | ICD-10-CM | POA: Diagnosis not present

## 2019-04-01 DIAGNOSIS — M6281 Muscle weakness (generalized): Secondary | ICD-10-CM

## 2019-04-01 NOTE — Therapy (Signed)
[facility] [address], [place], [address] Phone: [phone]   Fax:  [phone]  Physical Therapy Treatment/Progress Note Reporting Period 02/25/67-04/01/19  Patient Details  Name: [name] MRN: [id number] Date of Birth: 68-May-1952 No data recorded  Encounter Date: 04/01/2019  PT End of Session - 04/01/19 1539    Visit Number  27    Number of Visits  29    Date for PT Re-Evaluation  04/08/19    Authorization Type  medicare    Authorization - Visit Number  10    Authorization - Number of Visits  10    PT Start Time  0315    PT Stop Time  0400    PT Time Calculation (min)  45 min    Activity Tolerance  Patient tolerated treatment well;No increased pain    Behavior During Therapy  WFL for tasks assessed/performed       Past Medical History:  Diagnosis Date  . Arthritis    back, fingers with joint pain and swelling.  chronic back pain  . Chronic back pain    arthritis   . Diverticulosis   . Elevated cholesterol    takes Niacin daily and Simvastatin  . GERD (gastroesophageal reflux disease) 06/2004   non-specific gastritis on EGD 06/2004  . Headache(784.0)    occasionally  . History of colon polyps 2004, 2009   2004:adenomatous. 2009 hyperplastic.   Marland Kitchen History of hiatal hernia   . Hypertension    takes Tenoretic and Lisinopril daily  . Mucoid cyst of joint 08/2013   right index finger  . Osteopenia 01/2014   T score -1.1 FRAX 14%/0.5%. Stable from prior DEXA  . PONV (postoperative nausea and vomiting)   . Rotator cuff arthropathy    Left  . Seasonal allergies   . Stroke ([place]) 1998   x 2 - mild left-sided weakness  . Urge incontinence   . Uterine prolapse     Past Surgical History:  Procedure Laterality Date  . BACK SURGERY     x 2  . BELPHAROPTOSIS REPAIR Bilateral 12/14/2017  . CATARACT EXTRACTION    . CATARACT EXTRACTION Bilateral 10/2017  . COLONOSCOPY  2004, 2009, 2014   . FINGER SURGERY    . GUM SURGERY    . GYNECOLOGIC CRYOSURGERY    . HYSTEROSCOPY WITH D & C  12/07/2010   with resection of endometrial polyp  . JOINT REPLACEMENT    . KNEE ARTHROSCOPY Left 2008  . lip biopsy     done at MD office Fri 11/22/13  . MASS EXCISION Right 08/15/2013   Procedure: RIGHT INDEX EXCISION MASS ;  Surgeon: Tennis Must, MD;  Location: [facility];  Service: Orthopedics;  Laterality: Right;  . NASAL SEPTUM SURGERY    . OOPHORECTOMY Right 2000  . REVERSE SHOULDER ARTHROPLASTY Left 12/11/2018  . REVERSE SHOULDER ARTHROPLASTY Left 12/11/2018   Procedure: LEFT REVERSE SHOULDER ARTHROPLASTY;  Surgeon: Meredith Pel, MD;  Location: [facility];  Service: Orthopedics;  Laterality: Left;  . SHOULDER ARTHROSCOPY WITH OPEN ROTATOR CUFF REPAIR AND DISTAL CLAVICLE ACROMINECTOMY Right 11/26/2013   Procedure: RIGHT SHOULDER ARTHROSCOPY WITH MINI OPEN ROTATOR CUFF REPAIR AND DISTAL CLAVICLE RESECTION, SUBACROMIAL DECOMPRESSION, POSSIBLE [facility] PATCH.;  Surgeon: [name], MD;  Location: [facility];  Service: Orthopedics;  Laterality: Right;  . TOTAL KNEE ARTHROPLASTY Right 03/21/2017  . TOTAL KNEE ARTHROPLASTY Right 03/21/2017   Procedure: RIGHT TOTAL KNEE ARTHROPLASTY;  Surgeon: [name], MD;  Location: [facility];  Service: Orthopedics;  Laterality: Right;  . TUBAL LIGATION      There were no vitals filed for this visit.  Subjective Assessment - 04/01/19 1517    Subjective  Reports some soreness in shoulder. Doing well  overall, able to complete her exercises with 3/10 pain with overhead motion    Pertinent History  Pt is a 68 year old female s/p L reverse total shoulder 12/11/18. Reports she was immobilized in her sling for 2 weeks. After immobilization has been using passive motion machine for flexion. Has been doing AAROM flexion/abd/ER, bicep, shrugs, and forward punches with 1# with HHPT over the past week. MD Marlou Sa advised patient for out of sling, AAROM,  PROM with no lifting of more than 5# until next followup with him (mid Oct). Pt reports only 2/10 at rest, 6/10 with motion. Pain is at the medial upper arm and in the armpit. Pain is worse during the evening after moving it all day. Patient lives with her husband and her 4 dogs, which she walks frequently and cares for. She is R handed.Pt denies N/V, B&B changes, unexplained weight fluctuation, saddle paresthesia, fever, night sweats, or unrelenting night pain at this time.Pt denies N/V, B&B changes, unexplained weight fluctuation, saddle paresthesia, fever, night sweats, or unrelenting night pain at this time.    Limitations  House hold activities;Lifting    How long can you sit comfortably?  unlimited    How long can you stand comfortably?  unlimited    How long can you walk comfortably?  unlimited    Diagnostic tests  Xrays    Patient Stated Goals  Being able to complete daily tasks    Pain Onset  1 to 4 weeks ago           Shoulder motion at beginning of session Flex 127d abd100d IR L PSIS ERC3  Ther-Ex -UBE L 1 resistance27mn forward;244m backward AROM, PROM, and MMT assessment (see goal update for AROM and MMT). Passive motion within functional limits for flex and abd, lacking approx 20d IR and 10d ER - Farmers carry 10020fith 10# DB with  Fatigue following; cuing for proper shoulder positioning initially - Sleeper stretch 2x 60sec with education on increasing IR for behind the back motion - Towel AAROM shoulder IR x10 5sec holds - Sidleying ER <> IR3# DB belly-neutralx10; 2# 2x 10  withoutfasciculation  -Supineflexion 3# DB 0-160d 2x10 with good control - Scaption 2# DB 2x 10 with min cuing for scapular motion with good carry over - Wall slides 2x 10 with cuing for scapular positioning and movement initially with good carry over - Prone Y review for HEP   Goal review with education on scapulohumeral rhythm needed for overhead motion; HEP updated to reflect this  [id number] with patient demonstrating and verbalizing understanding of HEP update                        PT Short Term Goals - 02/13/19 1308      PT SHORT TERM GOAL #1   Title  Pt will be independent with HEP in order to improve strength and balance in order to improve function at home    Baseline  01/02/19    Time  4    Period  Weeks    Status  Achieved        PT Long Term Goals - 04/01/19 1519  PT LONG TERM GOAL #1   Title  Pt will decrease quick DASH score by at least 8% in order to demonstrate clinically significant reduction in disability.    Baseline  02/25/19 56.8%    Time  8    Period  Weeks    Status  Achieved      PT LONG TERM GOAL #2   Title  Patient will demonstrate full AROM in L shoulder in order to complete household ADLs    Baseline  eval 74d Flexion, 58* ABDCT: 02/13/19: LUE shoulder fleixon 126*, abduction 95*; 02/25/19 120* Flexion, 95* ABDCT, IR  L PSIS, ER C7; 04/01/19 flex 121d abd 110d IR L PSIS ER C8    Time  8    Period  Weeks    Status  On-going      PT LONG TERM GOAL #3   Title  Pt will decrease worst pain as reported on NPRS by at least 3 points in order to demonstrate clinically significant reduction in pain.    Baseline  02/25/19 2/10 pain with movement; 04/01/19 3/10 with overhead motion    Time  8    Period  Weeks    Status  Achieved      PT LONG TERM GOAL #4   Title  Pt will demonstrate gross bilat shoulder and periscapular strength of 4/10 in order to safely lift for heavy household chores    Baseline  04/01/19 R/L  shoulder flex: 4+/4 abd: 4+/4+ IR: 4-/4 ER: 3+/4 ext: Periscap Y 3+/3+ T 4/4  I 4+/4 02/25/19 R/L  shoulder flex: 4+/4 abd: 4+/4+ IR: 3+/4 ER: 3+/4 ext: Periscap Y 3+/3- T 4/4-  I 4+/4- ; 01/02/19 see eval    Time  8    Period  Weeks    Status  On-going      PT LONG TERM GOAL #5   Title  Patient will be able to carry 30lb backpack 230f in order to demonstrate safety with taking her hunting equipment into  the woods    Baseline  04/01/19 10# DB farmers carry with fatigue at 1026fand some increased pain    Time  6    Period  Weeks    Status  New            Plan - 04/01/19 1558    Clinical Impression Statement  PT reassessed goals this session per medicare compliance where patient is making strong progress. Strength deficits inhibiting overhead motion with strength and motion deficits in rotational movements needed to dress and bathe. Strength deficits inhibiting participation in hunting hobby. Goals revised to reflect this. HEP updated to include therex from this session withfocus on scapulohumeral rhythm and strengthening, and AAROM on ER and IR motions. Patient able to complete all therex with proper technique following min cuing and verbalizes good understanding of HEP update. PT will conitnue progression to address remaining deficits.    Personal Factors and Comorbidities  Age;Sex;Behavior Pattern;Comorbidity 1;Fitness    Comorbidities  HTN    Examination-Activity Limitations  Dressing;Carry;Reach Overhead;Lift    Examination-Participation Restrictions  Driving;Community Activity;Cleaning    Stability/Clinical Decision Making  Evolving/Moderate complexity    Clinical Decision Making  Moderate    Rehab Potential  Good    PT Frequency  2x / week    PT Duration  8 weeks    PT Treatment/Interventions  ADLs/Self Care Home Management;Electrical Stimulation;Therapeutic activities;Passive range of motion;Manual techniques;Patient/family education;Therapeutic exercise;Iontophoresis 79m22ml Dexamethasone;Moist Heat;Traction;Cryotherapy;Ultrasound;Functional mobility training;Neuromuscular re-education;Balance training;Dry needling;Joint Manipulations  PT Next Visit Plan  Continue to gently progress mobility and strength    PT Home Exercise Plan  [id number]    Consulted and Agree with Plan of Care  Patient       Patient will benefit from skilled therapeutic intervention in order to improve the  following deficits and impairments:  Decreased mobility, Decreased endurance, Pain, Postural dysfunction, Impaired UE functional use, Impaired flexibility, Increased fascial restricitons, Decreased strength, Decreased coordination, Decreased range of motion, Improper body mechanics  Visit Diagnosis: Stiffness of left shoulder, not elsewhere classified  Acute pain of left shoulder  Muscle weakness (generalized)  Difficulty in walking, not elsewhere classified  Pain in left ankle and joints of left foot     Problem List Patient Active Problem List   Diagnosis Date Noted  . 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 Chronic left shoulder pain 08/23/2018  . Chronic diarrhea 12/07/2014  . Segmental colitis with rectal bleeding ([place]) 09/12/2014  . Routine general medical examination at a health care facility 06/05/2014  . Overweight 06/13/2011  . ABNORMAL CHEST XRAY 09/30/2007  . Hyperlipidemia 02/17/2007  . Essential hypertension 01/08/2007  . Osteoarthritis 01/08/2007   Shelton Silvas PT, DPT Shelton Silvas 04/01/2019, 5:03 PM  [facility] [address], [place], [address] Phone: [phone]   Fax:  [phone]  Name: AMIJAH TIMOTHY MRN: [id number] Date of Birth: 12/05/1950

## 2019-04-03 ENCOUNTER — Ambulatory Visit: Payer: Medicare Other | Admitting: Physical Therapy

## 2019-04-03 ENCOUNTER — Encounter: Payer: Self-pay | Admitting: Physical Therapy

## 2019-04-03 DIAGNOSIS — M25661 Stiffness of right knee, not elsewhere classified: Secondary | ICD-10-CM | POA: Diagnosis not present

## 2019-04-03 DIAGNOSIS — M25512 Pain in left shoulder: Secondary | ICD-10-CM

## 2019-04-03 DIAGNOSIS — R262 Difficulty in walking, not elsewhere classified: Secondary | ICD-10-CM | POA: Diagnosis not present

## 2019-04-03 DIAGNOSIS — M6281 Muscle weakness (generalized): Secondary | ICD-10-CM

## 2019-04-03 DIAGNOSIS — M25612 Stiffness of left shoulder, not elsewhere classified: Secondary | ICD-10-CM | POA: Diagnosis not present

## 2019-04-03 DIAGNOSIS — M25572 Pain in left ankle and joints of left foot: Secondary | ICD-10-CM | POA: Diagnosis not present

## 2019-04-03 NOTE — Therapy (Signed)
[facility] [address] [place], [address] Phone: [phone]   Fax:  [phone]  Physical Therapy Treatment  Patient Details  Name: [name] MRN: [id number] Date of Birth: 05/19/1950 No data recorded  Encounter Date: 04/03/2019    Past Medical History:  Diagnosis Date  . Arthritis    back, fingers with joint pain and swelling.  chronic back pain  . Chronic back pain    arthritis   . Diverticulosis   . Elevated cholesterol    takes Niacin daily and Simvastatin  . GERD (gastroesophageal reflux disease) 06/2004   non-specific gastritis on EGD 06/2004  . Headache(784.0)    occasionally  . History of colon polyps 2004, 2009   2004:adenomatous. 2009 hyperplastic.   Marland Kitchen History of hiatal hernia   . Hypertension    takes Tenoretic and Lisinopril daily  . Mucoid cyst of joint 08/2013   right index finger  . Osteopenia 01/2014   T score -1.1 FRAX 14%/0.5%. Stable from prior DEXA  . PONV (postoperative nausea and vomiting)   . Rotator cuff arthropathy    Left  . Seasonal allergies   . Stroke ([place]) 1998   x 2 - mild left-sided weakness  . Urge incontinence   . Uterine prolapse     Past Surgical History:  Procedure Laterality Date  . BACK SURGERY     x 2  . BELPHAROPTOSIS REPAIR Bilateral 12/14/2017  . CATARACT EXTRACTION    . CATARACT EXTRACTION Bilateral 10/2017  . COLONOSCOPY  2004, 2009, 2014  . FINGER SURGERY    . GUM SURGERY    . GYNECOLOGIC CRYOSURGERY    . HYSTEROSCOPY WITH D & C  12/07/2010   with resection of endometrial polyp  . JOINT REPLACEMENT    . KNEE ARTHROSCOPY Left 2008  . lip biopsy     done at MD office Fri 11/22/13  . MASS EXCISION Right 08/15/2013   Procedure: RIGHT INDEX EXCISION MASS ;  Surgeon: Tennis Must, MD;  Location: [facility];  Service: Orthopedics;  Laterality: Right;  . NASAL SEPTUM SURGERY    . OOPHORECTOMY Right 2000  . REVERSE  SHOULDER ARTHROPLASTY Left 12/11/2018  . REVERSE SHOULDER ARTHROPLASTY Left 12/11/2018   Procedure: LEFT REVERSE SHOULDER ARTHROPLASTY;  Surgeon: Meredith Pel, MD;  Location: [facility];  Service: Orthopedics;  Laterality: Left;  . SHOULDER ARTHROSCOPY WITH OPEN ROTATOR CUFF REPAIR AND DISTAL CLAVICLE ACROMINECTOMY Right 11/26/2013   Procedure: RIGHT SHOULDER ARTHROSCOPY WITH MINI OPEN ROTATOR CUFF REPAIR AND DISTAL CLAVICLE RESECTION, SUBACROMIAL DECOMPRESSION, POSSIBLE [facility] PATCH.;  Surgeon: [name], MD;  Location: [facility];  Service: Orthopedics;  Laterality: Right;  . TOTAL KNEE ARTHROPLASTY Right 03/21/2017  . TOTAL KNEE ARTHROPLASTY Right 03/21/2017   Procedure: RIGHT TOTAL KNEE ARTHROPLASTY;  Surgeon: [name], MD;  Location: [facility];  Service: Orthopedics;  Laterality: Right;  . TUBAL LIGATION      There were no vitals filed for this visit.    Shoulder motion at beginning of session Flex 127d abd100d IR L PSIS ERC3  Ther-Ex -UBE L 1 resistance14mn forward;277m backward - IR and ER GTB 3x 10 each with ER slightly past neutral; min cuing initially to prevent trunk rotation compensation with good carry over following - Shoulder extension with depression GTB 3x 10 with min cuing initially for scapular motion with good carry over following - Plantigrade scapular taps 3x 10 each shoulder cuing for "up  and out" tap in scaption with good carry over - Scaption 2# DB 3x 10 with min cuing for scapular motion with good carry over - Wall slides 2x 12 with good carry over of proper scapulohumeral rhythm                           PT Short Term Goals - 02/13/19 1308      PT SHORT TERM GOAL #1   Title  Pt will be independent with HEP in order to improve strength and balance in order to improve function at home    Baseline  01/02/19    Time  4    Period  Weeks    Status  Achieved        PT Long Term Goals - 04/01/19 1519      PT LONG  TERM GOAL #1   Title  Pt will decrease quick DASH score by at least 8% in order to demonstrate clinically significant reduction in disability.    Baseline  02/25/19 56.8%    Time  8    Period  Weeks    Status  Achieved      PT LONG TERM GOAL #2   Title  Patient will demonstrate full AROM in L shoulder in order to complete household ADLs    Baseline  eval 74d Flexion, 58* ABDCT: 02/13/19: LUE shoulder fleixon 126*, abduction 95*; 02/25/19 120* Flexion, 95* ABDCT, IR  L PSIS, ER C7; 04/01/19 flex 121d abd 110d IR L PSIS ER C8    Time  8    Period  Weeks    Status  On-going      PT LONG TERM GOAL #3   Title  Pt will decrease worst pain as reported on NPRS by at least 3 points in order to demonstrate clinically significant reduction in pain.    Baseline  02/25/19 2/10 pain with movement; 04/01/19 3/10 with overhead motion    Time  8    Period  Weeks    Status  Achieved      PT LONG TERM GOAL #4   Title  Pt will demonstrate gross bilat shoulder and periscapular strength of 4/10 in order to safely lift for heavy household chores    Baseline  04/01/19 R/L  shoulder flex: 4+/4 abd: 4+/4+ IR: 4-/4 ER: 3+/4 ext: Periscap Y 3+/3+ T 4/4  I 4+/4 02/25/19 R/L  shoulder flex: 4+/4 abd: 4+/4+ IR: 3+/4 ER: 3+/4 ext: Periscap Y 3+/3- T 4/4-  I 4+/4- ; 01/02/19 see eval    Time  8    Period  Weeks    Status  On-going      PT LONG TERM GOAL #5   Title  Patient will be able to carry 30lb backpack 247f in order to demonstrate safety with taking her hunting equipment into the woods    Baseline  04/01/19 10# DB farmers carry with fatigue at 1030fand some increased pain    Time  6    Period  Weeks    Status  New              Patient will benefit from skilled therapeutic intervention in order to improve the following deficits and impairments:     Visit Diagnosis: No diagnosis found.     Problem List Patient Active Problem List   Diagnosis Date Noted  . OA (osteoarthritis) of shoulder  12/11/2018  . Chronic left shoulder pain 08/23/2018  .  Chronic diarrhea 12/07/2014  . Segmental colitis with rectal bleeding ([place]) 09/12/2014  . Routine general medical examination at a health care facility 06/05/2014  . Overweight 06/13/2011  . ABNORMAL CHEST XRAY 09/30/2007  . Hyperlipidemia 02/17/2007  . Essential hypertension 01/08/2007  . Osteoarthritis 01/08/2007   Shelton Silvas PT, DPT Shelton Silvas 04/03/2019, 2:34 PM   [facility] [address] [place], [address] Phone: [phone]   Fax:  [phone]  Name: [name] MRN: [id number] Date of Birth: 1950-08-02

## 2019-04-08 ENCOUNTER — Other Ambulatory Visit: Payer: Self-pay

## 2019-04-08 ENCOUNTER — Ambulatory Visit: Payer: Medicare Other | Admitting: Physical Therapy

## 2019-04-08 ENCOUNTER — Encounter: Payer: Self-pay | Admitting: Physical Therapy

## 2019-04-08 DIAGNOSIS — R262 Difficulty in walking, not elsewhere classified: Secondary | ICD-10-CM | POA: Diagnosis not present

## 2019-04-08 DIAGNOSIS — M25512 Pain in left shoulder: Secondary | ICD-10-CM | POA: Diagnosis not present

## 2019-04-08 DIAGNOSIS — M25661 Stiffness of right knee, not elsewhere classified: Secondary | ICD-10-CM | POA: Diagnosis not present

## 2019-04-08 DIAGNOSIS — M25612 Stiffness of left shoulder, not elsewhere classified: Secondary | ICD-10-CM

## 2019-04-08 DIAGNOSIS — M25572 Pain in left ankle and joints of left foot: Secondary | ICD-10-CM | POA: Diagnosis not present

## 2019-04-08 DIAGNOSIS — M6281 Muscle weakness (generalized): Secondary | ICD-10-CM | POA: Diagnosis not present

## 2019-04-08 NOTE — Therapy (Signed)
[facility] [address], [place], [address] Phone: [phone]   Fax:  [phone]  Physical Therapy Treatment/Re-Cert  Patient Details  Name: [name] MRN: [id number] Date of Birth: Dec 09, 1950 No data recorded  Encounter Date: 04/08/2019  PT End of Session - 04/08/19 1440    Visit Number  29    Number of Visits  39    Date for PT Re-Evaluation  05/13/19    Authorization - Visit Number  2    Authorization - Number of Visits  10    PT Start Time  0230    PT Stop Time  0315    PT Time Calculation (min)  45 min    Activity Tolerance  Patient tolerated treatment well;No increased pain    Behavior During Therapy  WFL for tasks assessed/performed       Past Medical History:  Diagnosis Date  . Arthritis    back, fingers with joint pain and swelling.  chronic back pain  . Chronic back pain    arthritis   . Diverticulosis   . Elevated cholesterol    takes Niacin daily and Simvastatin  . GERD (gastroesophageal reflux disease) 06/2004   non-specific gastritis on EGD 06/2004  . Headache(784.0)    occasionally  . History of colon polyps 2004, 2009   2004:adenomatous. 2009 hyperplastic.   Marland Kitchen History of hiatal hernia   . Hypertension    takes Tenoretic and Lisinopril daily  . Mucoid cyst of joint 08/2013   right index finger  . Osteopenia 01/2014   T score -1.1 FRAX 14%/0.5%. Stable from prior DEXA  . PONV (postoperative nausea and vomiting)   . Rotator cuff arthropathy    Left  . Seasonal allergies   . Stroke ([place]) 1998   x 2 - mild left-sided weakness  . Urge incontinence   . Uterine prolapse     Past Surgical History:  Procedure Laterality Date  . BACK SURGERY     x 2  . BELPHAROPTOSIS REPAIR Bilateral 12/14/2017  . CATARACT EXTRACTION    . CATARACT EXTRACTION Bilateral 10/2017  . COLONOSCOPY  2004, 2009, 2014  . FINGER SURGERY    . GUM SURGERY    . GYNECOLOGIC CRYOSURGERY    .  HYSTEROSCOPY WITH D & C  12/07/2010   with resection of endometrial polyp  . JOINT REPLACEMENT    . KNEE ARTHROSCOPY Left 2008  . lip biopsy     done at MD office Fri 11/22/13  . MASS EXCISION Right 08/15/2013   Procedure: RIGHT INDEX EXCISION MASS ;  Surgeon: Tennis Must, MD;  Location: [facility];  Service: Orthopedics;  Laterality: Right;  . NASAL SEPTUM SURGERY    . OOPHORECTOMY Right 2000  . REVERSE SHOULDER ARTHROPLASTY Left 12/11/2018  . REVERSE SHOULDER ARTHROPLASTY Left 12/11/2018   Procedure: LEFT REVERSE SHOULDER ARTHROPLASTY;  Surgeon: Meredith Pel, MD;  Location: [facility];  Service: Orthopedics;  Laterality: Left;  . SHOULDER ARTHROSCOPY WITH OPEN ROTATOR CUFF REPAIR AND DISTAL CLAVICLE ACROMINECTOMY Right 11/26/2013   Procedure: RIGHT SHOULDER ARTHROSCOPY WITH MINI OPEN ROTATOR CUFF REPAIR AND DISTAL CLAVICLE RESECTION, SUBACROMIAL DECOMPRESSION, POSSIBLE [facility] PATCH.;  Surgeon: [name], MD;  Location: [facility];  Service: Orthopedics;  Laterality: Right;  . TOTAL KNEE ARTHROPLASTY Right 03/21/2017  . TOTAL KNEE ARTHROPLASTY Right 03/21/2017   Procedure: RIGHT TOTAL KNEE ARTHROPLASTY;  Surgeon: [name], MD;  Location: [facility];  Service: Orthopedics;  Laterality: Right;  . TUBAL LIGATION      There were no vitals filed for this visit.  Subjective Assessment - 04/08/19 1434    Subjective  About 2/10 pain this am. Compliance with HEP    Pertinent History  Pt is a 68 year old female s/p L reverse total shoulder 12/11/18. Reports she was immobilized in her sling for 2 weeks. After immobilization has been using passive motion machine for flexion. Has been doing AAROM flexion/abd/ER, bicep, shrugs, and forward punches with 1# with HHPT over the past week. MD Marlou Sa advised patient for out of sling, AAROM, PROM with no lifting of more than 5# until next followup with him (mid Oct). Pt reports only 2/10 at rest, 6/10 with motion. Pain is at the medial upper  arm and in the armpit. Pain is worse during the evening after moving it all day. Patient lives with her husband and her 4 dogs, which she walks frequently and cares for. She is R handed.Pt denies N/V, B&B changes, unexplained weight fluctuation, saddle paresthesia, fever, night sweats, or unrelenting night pain at this time.Pt denies N/V, B&B changes, unexplained weight fluctuation, saddle paresthesia, fever, night sweats, or unrelenting night pain at this time.    Limitations  House hold activities;Lifting    How long can you sit comfortably?  unlimited    How long can you stand comfortably?  unlimited    How long can you walk comfortably?  unlimited    Diagnostic tests  Xrays    Patient Stated Goals  Being able to complete daily tasks    Currently in Pain?  Yes    Pain Score  3        Shoulder motion at beginning of session Flex 121d abd104d IR L PSIS ERC6  Ther-Ex -UBE L1resistance57mn forward;248m backward - Lat pulldown 10# 3x 10 (attempted 15# unable) cuing for maintained scapular retraction with good carry over - Chest/overhead OMEGA press 10# 3x 10/9/8 with cuing to maintain scapular contact to chair for scapulohumeral rhythm  - Seated bilat horizontal abd with maintained ER 3x 10/9/9 with difficulty maintaining ER, good carry over of scapular retraction with TC of seat - Plantigrade scapular taps 3x 10 each shoulder cuing for "up and out" tap in scaption with good carry over - Shoulder extension with depression GTB 3x 10 with min cuing initially for scapular motion with good carry over following                          PT Education - 04/08/19 1435    Education Details  therex form    Person(s) Educated  Patient    Methods  Explanation;Demonstration;Verbal cues    Comprehension  Verbalized understanding;Returned demonstration;Verbal cues required       PT Short Term Goals - 02/13/19 1308      PT SHORT TERM GOAL #1   Title  Pt will be  independent with HEP in order to improve strength and balance in order to improve function at home    Baseline  01/02/19    Time  4    Period  Weeks    Status  Achieved        PT Long Term Goals - 04/01/19 1519      PT LONG TERM GOAL #1   Title  Pt will decrease quick DASH score by at least 8% in order to demonstrate clinically significant reduction in disability.    Baseline  02/25/19 56.8%    Time  8    Period  Weeks    Status  Achieved      PT LONG TERM GOAL #2   Title  Patient will demonstrate full AROM in L shoulder in order to complete household ADLs    Baseline  eval 74d Flexion, 58* ABDCT: 02/13/19: LUE shoulder fleixon 126*, abduction 95*; 02/25/19 120* Flexion, 95* ABDCT, IR  L PSIS, ER C7; 04/01/19 flex 121d abd 110d IR L PSIS ER C8    Time  8    Period  Weeks    Status  On-going      PT LONG TERM GOAL #3   Title  Pt will decrease worst pain as reported on NPRS by at least 3 points in order to demonstrate clinically significant reduction in pain.    Baseline  02/25/19 2/10 pain with movement; 04/01/19 3/10 with overhead motion    Time  8    Period  Weeks    Status  Achieved      PT LONG TERM GOAL #4   Title  Pt will demonstrate gross bilat shoulder and periscapular strength of 4/10 in order to safely lift for heavy household chores    Baseline  04/01/19 R/L  shoulder flex: 4+/4 abd: 4+/4+ IR: 4-/4 ER: 3+/4 ext: Periscap Y 3+/3+ T 4/4  I 4+/4 02/25/19 R/L  shoulder flex: 4+/4 abd: 4+/4+ IR: 3+/4 ER: 3+/4 ext: Periscap Y 3+/3- T 4/4-  I 4+/4- ; 01/02/19 see eval    Time  8    Period  Weeks    Status  On-going      PT LONG TERM GOAL #5   Title  Patient will be able to carry 30lb backpack 283f in order to demonstrate safety with taking her hunting equipment into the woods    Baseline  04/01/19 10# DB farmers carry with fatigue at 1061fand some increased pain    Time  6    Period  Weeks    Status  New            Plan - 04/08/19 1449    Clinical Impression  Statement  PT continued therex progression with continued progression of closed chain and combined exercise with good success. Patient is able to complete all therex with proper technique following some cuing and modification. Patient motivated throughout session. PT will continue progression as able    Personal Factors and Comorbidities  Age;Sex;Behavior Pattern;Comorbidity 1;Fitness    Comorbidities  HTN    Examination-Activity Limitations  Dressing;Carry;Reach Overhead;Lift    Examination-Participation Restrictions  Driving;Community Activity;Cleaning    Stability/Clinical Decision Making  Evolving/Moderate complexity    Clinical Decision Making  Moderate    Rehab Potential  Good    PT Frequency  2x / week    PT Duration  8 weeks    PT Treatment/Interventions  ADLs/Self Care Home Management;Electrical Stimulation;Therapeutic activities;Passive range of motion;Manual techniques;Patient/family education;Therapeutic exercise;Iontophoresis 41m30ml Dexamethasone;Moist Heat;Traction;Cryotherapy;Ultrasound;Functional mobility training;Neuromuscular re-education;Balance training;Dry needling;Joint Manipulations    PT Next Visit Plan  Continue to gently progress mobility and strength    PT Home Exercise Plan  [id number]    Consulted and Agree with Plan of Care  Patient       Patient will benefit from skilled therapeutic intervention in order to improve the following deficits and impairments:  Decreased mobility, Decreased endurance, Pain, Postural dysfunction, Impaired UE functional use, Impaired flexibility, Increased fascial restricitons, Decreased strength, Decreased coordination, Decreased range of motion, Improper body mechanics  Visit Diagnosis: Stiffness of left shoulder, not elsewhere classified  Acute pain of left shoulder  Muscle weakness (generalized)     Problem List Patient Active Problem List   Diagnosis Date Noted  . 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 Chronic left  shoulder pain 08/23/2018  . Chronic diarrhea 12/07/2014  . Segmental colitis with rectal bleeding ([place]) 09/12/2014  . Routine general medical examination at a health care facility 06/05/2014  . Overweight 06/13/2011  . ABNORMAL CHEST XRAY 09/30/2007  . Hyperlipidemia 02/17/2007  . Essential hypertension 01/08/2007  . Osteoarthritis 01/08/2007   Shelton Silvas PT, DPT Shelton Silvas 04/08/2019, 3:07 PM  [facility] [address], [place], [address] Phone: [phone]   Fax:  [phone]  Name: [name] MRN: [id number] Date of Birth: 1950-08-20

## 2019-04-10 ENCOUNTER — Encounter: Payer: Self-pay | Admitting: Physical Therapy

## 2019-04-10 ENCOUNTER — Other Ambulatory Visit: Payer: Self-pay

## 2019-04-10 ENCOUNTER — Ambulatory Visit: Payer: Medicare Other | Admitting: Physical Therapy

## 2019-04-10 DIAGNOSIS — M25661 Stiffness of right knee, not elsewhere classified: Secondary | ICD-10-CM | POA: Diagnosis not present

## 2019-04-10 DIAGNOSIS — R262 Difficulty in walking, not elsewhere classified: Secondary | ICD-10-CM | POA: Diagnosis not present

## 2019-04-10 DIAGNOSIS — M25512 Pain in left shoulder: Secondary | ICD-10-CM

## 2019-04-10 DIAGNOSIS — M25612 Stiffness of left shoulder, not elsewhere classified: Secondary | ICD-10-CM

## 2019-04-10 DIAGNOSIS — M25572 Pain in left ankle and joints of left foot: Secondary | ICD-10-CM | POA: Diagnosis not present

## 2019-04-10 DIAGNOSIS — M6281 Muscle weakness (generalized): Secondary | ICD-10-CM

## 2019-04-10 NOTE — Therapy (Signed)
[facility] [address] [place], [address] Phone: [phone]   Fax:  [phone]  Physical Therapy Treatment  Patient Details  Name: [name] MRN: [id number] Date of Birth: [birth year] No data recorded  Encounter Date: 04/10/2019    Past Medical History:  Diagnosis Date  . Arthritis    back, fingers with joint pain and swelling.  chronic back pain  . Chronic back pain    arthritis   . Diverticulosis   . Elevated cholesterol    takes Niacin daily and Simvastatin  . GERD (gastroesophageal reflux disease) 06/2004   non-specific gastritis on EGD 06/2004  . Headache(784.0)    occasionally  . History of colon polyps 2004, 2009   2004:adenomatous. 2009 hyperplastic.   Marland Kitchen History of hiatal hernia   . Hypertension    takes Tenoretic and Lisinopril daily  . Mucoid cyst of joint 08/2013   right index finger  . Osteopenia 01/2014   T score -1.1 FRAX 14%/0.5%. Stable from prior DEXA  . PONV (postoperative nausea and vomiting)   . Rotator cuff arthropathy    Left  . Seasonal allergies   . Stroke ([place]) 1998   x 2 - mild left-sided weakness  . Urge incontinence   . Uterine prolapse     Past Surgical History:  Procedure Laterality Date  . BACK SURGERY     x 2  . BELPHAROPTOSIS REPAIR Bilateral 12/14/2017  . CATARACT EXTRACTION    . CATARACT EXTRACTION Bilateral 10/2017  . COLONOSCOPY  2004, 2009, 2014  . FINGER SURGERY    . GUM SURGERY    . GYNECOLOGIC CRYOSURGERY    . HYSTEROSCOPY WITH D & C  12/07/2010   with resection of endometrial polyp  . JOINT REPLACEMENT    . KNEE ARTHROSCOPY Left 2008  . lip biopsy     done at MD office Fri 11/22/13  . MASS EXCISION Right 08/15/2013   Procedure: RIGHT INDEX EXCISION MASS ;  Surgeon: Tennis Must, MD;  Location: [facility];  Service: Orthopedics;  Laterality: Right;  . NASAL SEPTUM SURGERY    . OOPHORECTOMY Right 2000  . REVERSE  SHOULDER ARTHROPLASTY Left 12/11/2018  . REVERSE SHOULDER ARTHROPLASTY Left 12/11/2018   Procedure: LEFT REVERSE SHOULDER ARTHROPLASTY;  Surgeon: Meredith Pel, MD;  Location: [facility];  Service: Orthopedics;  Laterality: Left;  . SHOULDER ARTHROSCOPY WITH OPEN ROTATOR CUFF REPAIR AND DISTAL CLAVICLE ACROMINECTOMY Right 11/26/2013   Procedure: RIGHT SHOULDER ARTHROSCOPY WITH MINI OPEN ROTATOR CUFF REPAIR AND DISTAL CLAVICLE RESECTION, SUBACROMIAL DECOMPRESSION, POSSIBLE [facility] PATCH.;  Surgeon: [name], MD;  Location: [place];  Service: Orthopedics;  Laterality: Right;  . TOTAL KNEE ARTHROPLASTY Right 03/21/2017  . TOTAL KNEE ARTHROPLASTY Right 03/21/2017   Procedure: RIGHT TOTAL KNEE ARTHROPLASTY;  Surgeon: [name], MD;  Location: [place];  Service: Orthopedics;  Laterality: Right;  . TUBAL LIGATION      There were no vitals filed for this visit.  Subjective Assessment - 04/10/19 1520    Subjective  Patient reports she had a massage today and feels pretty good. Is sore from last session, not to bad, 2/10 pain. Compliance with HEP    Pertinent History  Pt is a 68 year old female s/p L reverse total shoulder 12/11/18. Reports she was immobilized in her sling for 2 weeks. After immobilization has been using passive motion machine for flexion. Has been doing AAROM flexion/abd/ER, bicep, shrugs,  and forward punches with 1# with HHPT over the past week. MD Marlou Sa advised patient for out of sling, AAROM, PROM with no lifting of more than 5# until next followup with him (mid Oct). Pt reports only 2/10 at rest, 6/10 with motion. Pain is at the medial upper arm and in the armpit. Pain is worse during the evening after moving it all day. Patient lives with her husband and her 4 dogs, which she walks frequently and cares for. She is R handed.Pt denies N/V, B&B changes, unexplained weight fluctuation, saddle paresthesia, fever, night sweats, or unrelenting night pain at this time.Pt denies N/V,  B&B changes, unexplained weight fluctuation, saddle paresthesia, fever, night sweats, or unrelenting night pain at this time.    Limitations  House hold activities;Lifting    How long can you sit comfortably?  unlimited    How long can you stand comfortably?  unlimited    How long can you walk comfortably?  unlimited    Diagnostic tests  Xrays    Patient Stated Goals  Being able to complete daily tasks          Shoulder motion at beginning of session Flex 118d abd120d IR L PSIS ERC6  Ther-Ex -UBE L1resistance74mn forward;2106m backward - Supine ER <> IR in 90d abd 2# x10; with 3sec hold at each end range x12 - Supine flex 2# 2x 10 with min cuing for eccentric control, good carry over - Supine scap punches 3# bilat 2x 10 with cuing initially for technique with demo with good carry over following - Push up plus in plantigrade 3x 10 with demo and cuing initially to prevent excessive hip motion with good carry over - Chest/overhead OMEGA press 10# 3x 10 with min cuing for scapulohumeral rhythm initially  - Wall slides 2x 10 with cuing initially for proper technique with good carry over                          PT Short Term Goals - 02/13/19 1308      PT SHORT TERM GOAL #1   Title  Pt will be independent with HEP in order to improve strength and balance in order to improve function at home    Baseline  01/02/19    Time  4    Period  Weeks    Status  Achieved        PT Long Term Goals - 04/01/19 1519      PT LONG TERM GOAL #1   Title  Pt will decrease quick DASH score by at least 8% in order to demonstrate clinically significant reduction in disability.    Baseline  02/25/19 56.8%    Time  8    Period  Weeks    Status  Achieved      PT LONG TERM GOAL #2   Title  Patient will demonstrate full AROM in L shoulder in order to complete household ADLs    Baseline  eval 74d Flexion, 58* ABDCT: 02/13/19: LUE shoulder fleixon 126*, abduction 95*;  02/25/19 120* Flexion, 95* ABDCT, IR  L PSIS, ER C7; 04/01/19 flex 121d abd 110d IR L PSIS ER C8    Time  8    Period  Weeks    Status  On-going      PT LONG TERM GOAL #3   Title  Pt will decrease worst pain as reported on NPRS by at least 3 points in order to demonstrate clinically significant  reduction in pain.    Baseline  02/25/19 2/10 pain with movement; 04/01/19 3/10 with overhead motion    Time  8    Period  Weeks    Status  Achieved      PT LONG TERM GOAL #4   Title  Pt will demonstrate gross bilat shoulder and periscapular strength of 4/10 in order to safely lift for heavy household chores    Baseline  04/01/19 R/L  shoulder flex: 4+/4 abd: 4+/4+ IR: 4-/4 ER: 3+/4 ext: Periscap Y 3+/3+ T 4/4  I 4+/4 02/25/19 R/L  shoulder flex: 4+/4 abd: 4+/4+ IR: 3+/4 ER: 3+/4 ext: Periscap Y 3+/3- T 4/4-  I 4+/4- ; 01/02/19 see eval    Time  8    Period  Weeks    Status  On-going      PT LONG TERM GOAL #5   Title  Patient will be able to carry 30lb backpack 263f in order to demonstrate safety with taking her hunting equipment into the woods    Baseline  04/01/19 10# DB farmers carry with fatigue at 1067fand some increased pain    Time  6    Period  Weeks    Status  New              Patient will benefit from skilled therapeutic intervention in order to improve the following deficits and impairments:     Visit Diagnosis: No diagnosis found.     Problem List Patient Active Problem List   Diagnosis Date Noted  . 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 Chronic left shoulder pain 08/23/2018  . Chronic diarrhea 12/07/2014  . Seg[place]/06/2014  . Routine general medical examination at a health care facility 06/05/2014  . Overweight 06/13/2011  . ABNORMAL CHEST XRAY 09/30/2007  . Hyperlipidemia 02/17/2007  . Essential hypertension 01/08/2007  . Osteoarthritis 01/08/2007   ChShelton SilvasT, DPT ChShelton Silvas2/30/2020, 3:21  PM  [facility] [address]: [phone] Fax:  [phone]: Stacy FREEZERN: [id number] of Birth: 3/09-01-1951

## 2019-04-16 ENCOUNTER — Encounter: Payer: Self-pay | Admitting: Physical Therapy

## 2019-04-16 ENCOUNTER — Ambulatory Visit: Payer: Medicare Other | Attending: Orthopedic Surgery | Admitting: Physical Therapy

## 2019-04-16 ENCOUNTER — Other Ambulatory Visit: Payer: Self-pay

## 2019-04-16 DIAGNOSIS — M25612 Stiffness of left shoulder, not elsewhere classified: Secondary | ICD-10-CM

## 2019-04-16 DIAGNOSIS — M25572 Pain in left ankle and joints of left foot: Secondary | ICD-10-CM | POA: Diagnosis not present

## 2019-04-16 DIAGNOSIS — M25512 Pain in left shoulder: Secondary | ICD-10-CM

## 2019-04-16 DIAGNOSIS — M6281 Muscle weakness (generalized): Secondary | ICD-10-CM

## 2019-04-16 DIAGNOSIS — R262 Difficulty in walking, not elsewhere classified: Secondary | ICD-10-CM | POA: Insufficient documentation

## 2019-04-16 NOTE — Therapy (Signed)
[facility] [address], [place], [address] Phone: [phone]   Fax:  [phone]  Physical Therapy Treatment  Patient Details  Name: [name] MRN: [id number] Date of Birth: 09/08/1950 No data recorded  Encounter Date: 04/16/2019  PT End of Session - 04/16/19 1450    Visit Number  31    Number of Visits  39    Date for PT Re-Evaluation  05/13/19    Authorization Type  medicare    Authorization - Visit Number  4    Authorization - Number of Visits  10    PT Start Time  5993    PT Stop Time  0330    PT Time Calculation (min)  45 min    Activity Tolerance  Patient tolerated treatment well;No increased pain    Behavior During Therapy  WFL for tasks assessed/performed       Past Medical History:  Diagnosis Date  . Arthritis    back, fingers with joint pain and swelling.  chronic back pain  . Chronic back pain    arthritis   . Diverticulosis   . Elevated cholesterol    takes Niacin daily and Simvastatin  . GERD (gastroesophageal reflux disease) 06/2004   non-specific gastritis on EGD 06/2004  . Headache(784.0)    occasionally  . History of colon polyps 2004, 2009   2004:adenomatous. 2009 hyperplastic.   Marland Kitchen History of hiatal hernia   . Hypertension    takes Tenoretic and Lisinopril daily  . Mucoid cyst of joint 08/2013   right index finger  . Osteopenia 01/2014   T score -1.1 FRAX 14%/0.5%. Stable from prior DEXA  . PONV (postoperative nausea and vomiting)   . Rotator cuff arthropathy    Left  . Seasonal allergies   . Stroke ([place]) 1998   x 2 - mild left-sided weakness  . Urge incontinence   . Uterine prolapse     Past Surgical History:  Procedure Laterality Date  . BACK SURGERY     x 2  . BELPHAROPTOSIS REPAIR Bilateral 12/14/2017  . CATARACT EXTRACTION    . CATARACT EXTRACTION Bilateral 10/2017  . COLONOSCOPY  2004, 2009, 2014  . FINGER SURGERY    . GUM SURGERY    . GYNECOLOGIC  CRYOSURGERY    . HYSTEROSCOPY WITH D & C  12/07/2010   with resection of endometrial polyp  . JOINT REPLACEMENT    . KNEE ARTHROSCOPY Left 2008  . lip biopsy     done at MD office Fri 11/22/13  . MASS EXCISION Right 08/15/2013   Procedure: RIGHT INDEX EXCISION MASS ;  Surgeon: Tennis Must, MD;  Location: [facility];  Service: Orthopedics;  Laterality: Right;  . NASAL SEPTUM SURGERY    . OOPHORECTOMY Right 2000  . REVERSE SHOULDER ARTHROPLASTY Left 12/11/2018  . REVERSE SHOULDER ARTHROPLASTY Left 12/11/2018   Procedure: LEFT REVERSE SHOULDER ARTHROPLASTY;  Surgeon: Meredith Pel, MD;  Location: [facility];  Service: Orthopedics;  Laterality: Left;  . SHOULDER ARTHROSCOPY WITH OPEN ROTATOR CUFF REPAIR AND DISTAL CLAVICLE ACROMINECTOMY Right 11/26/2013   Procedure: RIGHT SHOULDER ARTHROSCOPY WITH MINI OPEN ROTATOR CUFF REPAIR AND DISTAL CLAVICLE RESECTION, SUBACROMIAL DECOMPRESSION, POSSIBLE [facility] PATCH.;  Surgeon: [name], MD;  Location: [place];  Service: Orthopedics;  Laterality: Right;  . TOTAL KNEE ARTHROPLASTY Right 03/21/2017  . TOTAL KNEE ARTHROPLASTY Right 03/21/2017   Procedure: RIGHT TOTAL KNEE ARTHROPLASTY;  Surgeon: Joni Fears  W, MD;  Location: [facility];  Service: Orthopedics;  Laterality: Right;  . TUBAL LIGATION      There were no vitals filed for this visit.  Subjective Assessment - 04/16/19 1446    Subjective  Patient reports shoulder soreness 2/10. Reports some compliance with HEP.    Pertinent History  Pt is a 69 year old female s/p L reverse total shoulder 12/11/18. Reports she was immobilized in her sling for 2 weeks. After immobilization has been using passive motion machine for flexion. Has been doing AAROM flexion/abd/ER, bicep, shrugs, and forward punches with 1# with HHPT over the past week. MD Marlou Sa advised patient for out of sling, AAROM, PROM with no lifting of more than 5# until next followup with him (mid Oct). Pt reports only 2/10 at rest,  6/10 with motion. Pain is at the medial upper arm and in the armpit. Pain is worse during the evening after moving it all day. Patient lives with her husband and her 4 dogs, which she walks frequently and cares for. She is R handed.Pt denies N/V, B&B changes, unexplained weight fluctuation, saddle paresthesia, fever, night sweats, or unrelenting night pain at this time.Pt denies N/V, B&B changes, unexplained weight fluctuation, saddle paresthesia, fever, night sweats, or unrelenting night pain at this time.    Limitations  House hold activities;Lifting    How long can you sit comfortably?  unlimited    How long can you stand comfortably?  unlimited    How long can you walk comfortably?  unlimited    Diagnostic tests  Xrays    Patient Stated Goals  Being able to complete daily tasks    Pain Onset  1 to 4 weeks ago       Shoulder motion at beginning of session Flex 122d abd117d IR L PSIS ERC8  Ther-Ex -UBE L1resistance40mn forward;270m backward - Sidelying ER<> IR 2x 10 2#  - Supine flex 3# 2x 9/10 with good carry over for proper technique - Supine scap punches 3# bilat 2x 10 with cuing initially for technique with scapular retraction - Plantigrade scap taps "up and out" 2x 2x 8  with demo and cuing initially to prevent excessive hip motion with good carry over - Wall slides x10 with some loss of proper form at last couple reps; RTB 2x 8 with difficulty maintaining ER, cuing for this - Chest/overhead OMEGA press 10# 3x 10 with min cuing for scapulohumeral rhythm initially  - high rows 10# 3x 10 with cuing for initial set up with good carry over                         PT Education - 04/16/19 1449    Education Details  therex form    Person(s) Educated  Patient    Methods  Explanation;Demonstration;Verbal cues    Comprehension  Verbalized understanding;Returned demonstration;Verbal cues required       PT Short Term Goals - 02/13/19 1308      PT SHORT  TERM GOAL #1   Title  Pt will be independent with HEP in order to improve strength and balance in order to improve function at home    Baseline  01/02/19    Time  4    Period  Weeks    Status  Achieved        PT Long Term Goals - 04/01/19 1519      PT LONG TERM GOAL #1   Title  Pt will decrease quick  DASH score by at least 8% in order to demonstrate clinically significant reduction in disability.    Baseline  02/25/19 56.8%    Time  8    Period  Weeks    Status  Achieved      PT LONG TERM GOAL #2   Title  Patient will demonstrate full AROM in L shoulder in order to complete household ADLs    Baseline  eval 74d Flexion, 58* ABDCT: 02/13/19: LUE shoulder fleixon 126*, abduction 95*; 02/25/19 120* Flexion, 95* ABDCT, IR  L PSIS, ER C7; 04/01/19 flex 121d abd 110d IR L PSIS ER C8    Time  8    Period  Weeks    Status  On-going      PT LONG TERM GOAL #3   Title  Pt will decrease worst pain as reported on NPRS by at least 3 points in order to demonstrate clinically significant reduction in pain.    Baseline  02/25/19 2/10 pain with movement; 04/01/19 3/10 with overhead motion    Time  8    Period  Weeks    Status  Achieved      PT LONG TERM GOAL #4   Title  Pt will demonstrate gross bilat shoulder and periscapular strength of 4/10 in order to safely lift for heavy household chores    Baseline  04/01/19 R/L  shoulder flex: 4+/4 abd: 4+/4+ IR: 4-/4 ER: 3+/4 ext: Periscap Y 3+/3+ T 4/4  I 4+/4 02/25/19 R/L  shoulder flex: 4+/4 abd: 4+/4+ IR: 3+/4 ER: 3+/4 ext: Periscap Y 3+/3- T 4/4-  I 4+/4- ; 01/02/19 see eval    Time  8    Period  Weeks    Status  On-going      PT LONG TERM GOAL #5   Title  Patient will be able to carry 30lb backpack 251f in order to demonstrate safety with taking her hunting equipment into the woods    Baseline  04/01/19 10# DB farmers carry with fatigue at 1028fand some increased pain    Time  6    Period  Weeks    Status  New            Plan -  04/16/19 1517    Clinical Impression Statement  PT continued therex progression with good success. Patient continues to have difficulty with scapulo-humeral rhythm, but she is continuing to do well correcting this with demo and cuing. PROM is improving, with difficulty with active motion matching PROM. PT will continue progression as able.    Personal Factors and Comorbidities  Age;Sex;Behavior Pattern;Comorbidity 1;Fitness    Comorbidities  HTN    Examination-Activity Limitations  Dressing;Carry;Reach Overhead;Lift    Examination-Participation Restrictions  Driving;Community Activity;Cleaning    Stability/Clinical Decision Making  Evolving/Moderate complexity    Clinical Decision Making  Moderate    Rehab Potential  Good    PT Frequency  2x / week    PT Duration  8 weeks    PT Treatment/Interventions  ADLs/Self Care Home Management;Electrical Stimulation;Therapeutic activities;Passive range of motion;Manual techniques;Patient/family education;Therapeutic exercise;Iontophoresis 61m18ml Dexamethasone;Moist Heat;Traction;Cryotherapy;Ultrasound;Functional mobility training;Neuromuscular re-education;Balance training;Dry needling;Joint Manipulations    PT Next Visit Plan  Continue to gently progress mobility and strength    PT Home Exercise Plan  [id number]    Consulted and Agree with Plan of Care  Patient       Patient will benefit from skilled therapeutic intervention in order to improve the following deficits and impairments:  Decreased mobility, Decreased endurance,  Pain, Postural dysfunction, Impaired UE functional use, Impaired flexibility, Increased fascial restricitons, Decreased strength, Decreased coordination, Decreased range of motion, Improper body mechanics  Visit Diagnosis: Stiffness of left shoulder, not elsewhere classified  Acute pain of left shoulder  Muscle weakness (generalized)     Problem List Patient Active Problem List   Diagnosis Date Noted  . OA (osteoarthritis)  of shoulder 12/11/2018  . Chronic left shoulder pain 08/23/2018  . Chronic diarrhea 12/07/2014  . Segmental colitis with rectal bleeding ([place]) 09/12/2014  . Routine general medical examination at a health care facility 06/05/2014  . Overweight 06/13/2011  . ABNORMAL CHEST XRAY 09/30/2007  . Hyperlipidemia 02/17/2007  . Essential hypertension 01/08/2007  . Osteoarthritis 01/08/2007   Shelton Silvas PT, DPT Shelton Silvas 04/16/2019, 3:29 PM  [place] [address] [place], [address] Phone: [phone]   Fax:  [phone]  Name: MARKETTA VALADEZ MRN: [id number] Date of Birth: Apr 14, 1950

## 2019-04-18 ENCOUNTER — Ambulatory Visit: Payer: Medicare Other | Admitting: Physical Therapy

## 2019-04-18 ENCOUNTER — Other Ambulatory Visit: Payer: Self-pay

## 2019-04-18 ENCOUNTER — Encounter: Payer: Self-pay | Admitting: Physical Therapy

## 2019-04-18 ENCOUNTER — Other Ambulatory Visit: Payer: Self-pay | Admitting: Surgical

## 2019-04-18 ENCOUNTER — Telehealth: Payer: Self-pay | Admitting: Orthopedic Surgery

## 2019-04-18 DIAGNOSIS — M25612 Stiffness of left shoulder, not elsewhere classified: Secondary | ICD-10-CM

## 2019-04-18 DIAGNOSIS — M25512 Pain in left shoulder: Secondary | ICD-10-CM

## 2019-04-18 DIAGNOSIS — R262 Difficulty in walking, not elsewhere classified: Secondary | ICD-10-CM

## 2019-04-18 DIAGNOSIS — M6281 Muscle weakness (generalized): Secondary | ICD-10-CM

## 2019-04-18 DIAGNOSIS — M25572 Pain in left ankle and joints of left foot: Secondary | ICD-10-CM | POA: Diagnosis not present

## 2019-04-18 MED ORDER — AMOXICILLIN 500 MG PO TABS: ORAL_TABLET | ORAL | 0 refills | Status: DC

## 2019-04-18 NOTE — Therapy (Signed)
[facility] [address], [place], [address] Phone: [phone]   Fax:  [phone]  Physical Therapy Treatment  Patient Details  Name: [name] MRN: [id number] Date of Birth: 1950/09/20 No data recorded  Encounter Date: 04/18/2019  PT End of Session - 04/18/19 1444    Visit Number  32    Number of Visits  77    Authorization Type  medicare    Authorization - Visit Number  5    Authorization - Number of Visits  10    PT Start Time  5284    PT Stop Time  0315    PT Time Calculation (min)  40 min    Activity Tolerance  Patient tolerated treatment well;No increased pain    Behavior During Therapy  WFL for tasks assessed/performed       Past Medical History:  Diagnosis Date  . Arthritis    back, fingers with joint pain and swelling.  chronic back pain  . Chronic back pain    arthritis   . Diverticulosis   . Elevated cholesterol    takes Niacin daily and Simvastatin  . GERD (gastroesophageal reflux disease) 06/2004   non-specific gastritis on EGD 06/2004  . Headache(784.0)    occasionally  . History of colon polyps 2004, 2009   2004:adenomatous. 2009 hyperplastic.   Marland Kitchen History of hiatal hernia   . Hypertension    takes Tenoretic and Lisinopril daily  . Mucoid cyst of joint 08/2013   right index finger  . Osteopenia 01/2014   T score -1.1 FRAX 14%/0.5%. Stable from prior DEXA  . PONV (postoperative nausea and vomiting)   . Rotator cuff arthropathy    Left  . Seasonal allergies   . Stroke ([place]) 1998   x 2 - mild left-sided weakness  . Urge incontinence   . Uterine prolapse     Past Surgical History:  Procedure Laterality Date  . BACK SURGERY     x 2  . BELPHAROPTOSIS REPAIR Bilateral 12/14/2017  . CATARACT EXTRACTION    . CATARACT EXTRACTION Bilateral 10/2017  . COLONOSCOPY  2004, 2009, 2014  . FINGER SURGERY    . GUM SURGERY    . GYNECOLOGIC CRYOSURGERY    . HYSTEROSCOPY WITH D &  C  12/07/2010   with resection of endometrial polyp  . JOINT REPLACEMENT    . KNEE ARTHROSCOPY Left 2008  . lip biopsy     done at MD office Fri 11/22/13  . MASS EXCISION Right 08/15/2013   Procedure: RIGHT INDEX EXCISION MASS ;  Surgeon: Tennis Must, MD;  Location: [facility];  Service: Orthopedics;  Laterality: Right;  . NASAL SEPTUM SURGERY    . OOPHORECTOMY Right 2000  . REVERSE SHOULDER ARTHROPLASTY Left 12/11/2018  . REVERSE SHOULDER ARTHROPLASTY Left 12/11/2018   Procedure: LEFT REVERSE SHOULDER ARTHROPLASTY;  Surgeon: Meredith Pel, MD;  Location: [facility];  Service: Orthopedics;  Laterality: Left;  . SHOULDER ARTHROSCOPY WITH OPEN ROTATOR CUFF REPAIR AND DISTAL CLAVICLE ACROMINECTOMY Right 11/26/2013   Procedure: RIGHT SHOULDER ARTHROSCOPY WITH MINI OPEN ROTATOR CUFF REPAIR AND DISTAL CLAVICLE RESECTION, SUBACROMIAL DECOMPRESSION, POSSIBLE [facility] PATCH.;  Surgeon: [name], MD;  Location: [facility];  Service: Orthopedics;  Laterality: Right;  . TOTAL KNEE ARTHROPLASTY Right 03/21/2017  . TOTAL KNEE ARTHROPLASTY Right 03/21/2017   Procedure: RIGHT TOTAL KNEE ARTHROPLASTY;  Surgeon: [name], MD;  Location: [facility];  Service: Orthopedics;  Laterality: Right;  . TUBAL LIGATION      There were no vitals filed for this visit.  Subjective Assessment - 04/18/19 1439    Subjective  Reports some soreness following last session 1/10 pain today. Compliance with HEP, has dialed back on strengthening therex.    Pertinent History  Pt is a 69 year old female s/p L reverse total shoulder 12/11/18. Reports she was immobilized in her sling for 2 weeks. After immobilization has been using passive motion machine for flexion. Has been doing AAROM flexion/abd/ER, bicep, shrugs, and forward punches with 1# with HHPT over the past week. MD Marlou Sa advised patient for out of sling, AAROM, PROM with no lifting of more than 5# until next followup with him (mid Oct). Pt reports only  2/10 at rest, 6/10 with motion. Pain is at the medial upper arm and in the armpit. Pain is worse during the evening after moving it all day. Patient lives with her husband and her 4 dogs, which she walks frequently and cares for. She is R handed.Pt denies N/V, B&B changes, unexplained weight fluctuation, saddle paresthesia, fever, night sweats, or unrelenting night pain at this time.Pt denies N/V, B&B changes, unexplained weight fluctuation, saddle paresthesia, fever, night sweats, or unrelenting night pain at this time.    Limitations  House hold activities;Lifting    How long can you sit comfortably?  unlimited    How long can you stand comfortably?  unlimited    How long can you walk comfortably?  unlimited    Diagnostic tests  Xrays    Patient Stated Goals  Being able to complete daily tasks       Ther-Ex -UBE L1resistance70mn forward;218m backward - ER band walkouts GTB 3steps x3 with cuing to prevent shoulder hiking with good carry over - Ball on wall rotations with scapular stabilization small ball x10 clockwise and counterclockwise; large ball 2x 10 clock/counter with good carry over of cuing for posture/positioning - OMEGA row 10# 3x 8 with min cuing to prevent shoulder hiking with good carry over - Supine flex with scap retraction with min-mod perturbations x15 with eyes closed 2 x15 with cuing for set up with good carry over - Supine flex 3# 3x 10 with good carry over for proper technique - Seated scaption x10 with cuing initially for scapular retraction; with 2# x10                          PT Education - 04/18/19 1444    Education Details  therex form    Person(s) Educated  Patient    Methods  Explanation;Demonstration;Verbal cues    Comprehension  Verbalized understanding;Returned demonstration;Verbal cues required       PT Short Term Goals - 02/13/19 1308      PT SHORT TERM GOAL #1   Title  Pt will be independent with HEP in order to  improve strength and balance in order to improve function at home    Baseline  01/02/19    Time  4    Period  Weeks    Status  Achieved        PT Long Term Goals - 04/01/19 1519      PT LONG TERM GOAL #1   Title  Pt will decrease quick DASH score by at least 8% in order to demonstrate clinically significant reduction in disability.    Baseline  02/25/19 56.8%    Time  8    Period  Weeks    Status  Achieved      PT LONG TERM GOAL #2   Title  Patient will demonstrate full AROM in L shoulder in order to complete household ADLs    Baseline  eval 74d Flexion, 58* ABDCT: 02/13/19: LUE shoulder fleixon 126*, abduction 95*; 02/25/19 120* Flexion, 95* ABDCT, IR  L PSIS, ER C7; 04/01/19 flex 121d abd 110d IR L PSIS ER C8    Time  8    Period  Weeks    Status  On-going      PT LONG TERM GOAL #3   Title  Pt will decrease worst pain as reported on NPRS by at least 3 points in order to demonstrate clinically significant reduction in pain.    Baseline  02/25/19 2/10 pain with movement; 04/01/19 3/10 with overhead motion    Time  8    Period  Weeks    Status  Achieved      PT LONG TERM GOAL #4   Title  Pt will demonstrate gross bilat shoulder and periscapular strength of 4/10 in order to safely lift for heavy household chores    Baseline  04/01/19 R/L  shoulder flex: 4+/4 abd: 4+/4+ IR: 4-/4 ER: 3+/4 ext: Periscap Y 3+/3+ T 4/4  I 4+/4 02/25/19 R/L  shoulder flex: 4+/4 abd: 4+/4+ IR: 3+/4 ER: 3+/4 ext: Periscap Y 3+/3- T 4/4-  I 4+/4- ; 01/02/19 see eval    Time  8    Period  Weeks    Status  On-going      PT LONG TERM GOAL #5   Title  Patient will be able to carry 30lb backpack 28f in order to demonstrate safety with taking her hunting equipment into the woods    Baseline  04/01/19 10# DB farmers carry with fatigue at 1040fand some increased pain    Time  6    Period  Weeks    Status  New            Plan - 04/18/19 1454    Clinical Impression Statement  PT continued therex  progression for scapular stabilization and UE strengthening with good carry over of all cuing for proper technique and motor control. PT will continue progression as able    Personal Factors and Comorbidities  Age;Sex;Behavior Pattern;Comorbidity 1;Fitness    Examination-Activity Limitations  Dressing;Carry;Reach Overhead;Lift    Examination-Participation Restrictions  Driving;Community Activity;Cleaning    Stability/Clinical Decision Making  Evolving/Moderate complexity    Clinical Decision Making  Moderate    Rehab Potential  Good    PT Frequency  2x / week    PT Duration  8 weeks    PT Treatment/Interventions  ADLs/Self Care Home Management;Electrical Stimulation;Therapeutic activities;Passive range of motion;Manual techniques;Patient/family education;Therapeutic exercise;Iontophoresis 41m55ml Dexamethasone;Moist Heat;Traction;Cryotherapy;Ultrasound;Functional mobility training;Neuromuscular re-education;Balance training;Dry needling;Joint Manipulations    PT Next Visit Plan  Continue to gently progress mobility and strength    PT Home Exercise Plan  [id number]    Consulted and Agree with Plan of Care  Patient       Patient will benefit from skilled therapeutic intervention in order to improve the following deficits and impairments:  Decreased mobility, Decreased endurance, Pain, Postural dysfunction, Impaired UE functional use, Impaired flexibility, Increased fascial restricitons, Decreased strength, Decreased coordination, Decreased range of motion, Improper body mechanics  Visit Diagnosis: Stiffness of left shoulder, not elsewhere classified  Acute pain of left shoulder  Muscle weakness (generalized)  Difficulty in walking, not elsewhere classified  Problem List Patient Active Problem List   Diagnosis Date Noted  . 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 Chronic left shoulder pain 08/23/2018  . Chronic diarrhea 12/07/2014  . Segmental colitis with rectal bleeding ([place])  09/12/2014  . Routine general medical examination at a health care facility 06/05/2014  . Overweight 06/13/2011  . ABNORMAL CHEST XRAY 09/30/2007  . Hyperlipidemia 02/17/2007  . Essential hypertension 01/08/2007  . Osteoarthritis 01/08/2007   Shelton Silvas PT, DPT Shelton Silvas 04/18/2019, 3:15 PM  [facility] [address] [place], [address] Phone: [phone]   Fax:  [phone]  Name: [name] MRN: [id number] Date of Birth: 12-Nov-1950

## 2019-04-18 NOTE — Telephone Encounter (Signed)
Please advise. Thanks.  

## 2019-04-18 NOTE — Telephone Encounter (Signed)
Patient called. She is having a dental procedure on Monday. Says she will need medication called in to the [facility] in [place].

## 2019-04-19 ENCOUNTER — Ambulatory Visit (INDEPENDENT_AMBULATORY_CARE_PROVIDER_SITE_OTHER): Payer: Medicare Other | Admitting: Physical Medicine and Rehabilitation

## 2019-04-19 DIAGNOSIS — M4802 Spinal stenosis, cervical region: Secondary | ICD-10-CM | POA: Diagnosis not present

## 2019-04-19 DIAGNOSIS — M542 Cervicalgia: Secondary | ICD-10-CM | POA: Diagnosis not present

## 2019-04-19 DIAGNOSIS — R202 Paresthesia of skin: Secondary | ICD-10-CM | POA: Diagnosis not present

## 2019-04-19 DIAGNOSIS — M47812 Spondylosis without myelopathy or radiculopathy, cervical region: Secondary | ICD-10-CM

## 2019-04-19 NOTE — Progress Notes (Signed)
 .  Numeric Pain Rating Scale and Functional Assessment Average Pain 5   In the last MONTH (on 0-10 scale) has pain interfered with the following?  1. General activity like being  able to carry out your everyday physical activities such as walking, climbing stairs, carrying groceries, or moving a chair?  Rating(7)

## 2019-04-22 ENCOUNTER — Encounter: Payer: Self-pay | Admitting: Physical Medicine and Rehabilitation

## 2019-04-22 NOTE — Progress Notes (Signed)
[name] - 69 y.o. female MRN [id number]  Date of birth: January 26, 1951  Office Visit Note: Visit Date: 04/19/2019 PCP: Hoyt Koch, MD Referred by: Hoyt Koch, *  Subjective: Chief Complaint  Patient presents with  . Neck - Pain  . Right Hand - Numbness, Tingling  . Left Hand - Numbness, Tingling  . Right Shoulder - Pain  . Left Shoulder - Pain   HPI: [name] is a 69 y.o. female who comes in today For evaluation management of chronic worsening neck pain with bilateral shoulder pain bilateral arm and hand pain with numbness and tingling in all the digits.  She is seen today at the request of Dr. Anderson Malta.  She is right-hand dominant and reports chronic history of neck pain with worsening over the last 3 months.  In terms of her neck pain she reports symptoms are worse with movement and rotation.  She does feel some cracking and popping when she moves.  She has some history of off-and-on headache.  She has not had any prior cervical surgery.  She has had conservative care with medication trials and time and exercise.  Failing conservative care she did have MRI of the cervical spine.  This is reviewed with the patient with spine models and the MRI imaging.  We spent about 20 minutes going over her cervical spine and her condition.  From the imaging she has significant narrowing of the canal at C4-5 but without depression or movement of the spinal cord.  She has several levels of facet arthropathy.  She really does not have that great of foraminal narrowing at least on the images that I looked at personally.  She is never had any interventional spine procedure of the cervical spine.  She has had 2 prior lumbar surgeries.  She is also status post bilateral shoulder surgeries with a left total shoulder replacement and right arthroscopy.  She is also had knee replacement.  She does have pain in all segments of the body but does not carry a diagnosis of fibromyalgia.   She does have multiple food and drug intolerances.  In terms of paresthesias she has more numbness and tingling equally in the right and left hand and more in the thumb index and middle fingers.  She will endorse mostly symptoms seem to be in the whole hand globally.  She does report using her hands and different positions seem to make it worse.  She reports wearing a brace at night does help sometimes with the symptoms.  She is not diabetic.  She has not had prior electrodiagnostic studies of the arms and hands.  She rates her symptoms as a 5 out of 10 but does endorse 7 out of 10 in terms of keeping her limited in what she can do from a functional standpoint.  Review of Systems  Constitutional: Negative for chills, fever, malaise/fatigue and weight loss.  HENT: Negative for hearing loss and sinus pain.   Eyes: Negative for blurred vision, double vision and photophobia.  Respiratory: Negative for cough and shortness of breath.   Cardiovascular: Negative for chest pain, palpitations and leg swelling.  Gastrointestinal: Negative for abdominal pain, nausea and vomiting.  Genitourinary: Negative for flank pain.  Musculoskeletal: Positive for back pain, joint pain and neck pain. Negative for myalgias.  Skin: Negative for itching and rash.  Neurological: Positive for tingling. Negative for tremors, focal weakness and weakness.  Endo/Heme/Allergies: Negative.   Psychiatric/Behavioral: Negative for depression.  All other systems reviewed and are negative.  Otherwise per HPI.  Assessment & Plan: Visit Diagnoses:  1. Paresthesia of skin   2. Cervicalgia   3. Spinal stenosis of cervical region   4. Cervical spondylosis without myelopathy     Plan: Impression: Chronic worsening neck pain with multilevel facet arthropathy and descent cervical stenosis centrally at C4-5.  I do think there is clearly reason for her neck pain which could be a combination of facet arthropathy in the cervical stenosis.   The stenosis could cause numbness in the hands but typically we would see this only with pretty profound stenosis.  I do not think the foraminal narrowing is really doing much with her and reviewing the images myself does not show significant foraminal narrowing at most levels.  She does have signs and complaints consistent with perhaps carpal tunnel syndrome as well.  This could be more of a double crush type phenomenon.  We elected to complete electrodiagnostic study today.  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   There is no significant electrodiagnostic evidence of any other focal nerve entrapment, brachial plexopathy, cervical radiculopathy or generalized peripheral neuropathy.   ** As you know, this particular electrodiagnostic study cannot rule out chemical radiculitis or sensory only radiculopathy.  Recommendations: 1.  Follow-up with referring physician. 2.  Continue current management of symptoms. 3.  *Suggest surgical evaluation for the bilateral median nerve entrapment.  From a standpoint of her neck pain I do feel like she is probably getting some pain from the central canal stenosis particular at C4-5.  I do not think the foraminal narrowing looks as bad on imaging as the report would suggest and I really do not think the foraminal narrowing is the issue here.  She could have some neck pain from facet arthritis as well.  She will follow-up with Dr. Anderson Malta for her carpal tunnel syndrome.  We will see her back for epidural injection diagnostically.  This would be at C7-T1 with fluoroscopic guidance.  Meds & Orders: No orders of the defined types were placed in this encounter.   Orders Placed This Encounter  Procedures  . NCV with EMG (electromyography)    Follow-up: Return for Anderson Malta,  MD.   Procedures:  EMG & NCV Findings: Evaluation of the left median motor and the right median motor nerves showed prolonged distal onset latency (L5.8, R6.3 ms) and decreased conduction velocity (Elbow-Wrist, L41, R42 m/s).  The left median (across palm) sensory and the right median (across palm) sensory nerves showed prolonged distal peak latency (Wrist, L5.3, R6.3 ms), reduced amplitude (L9.3, R5.7 V), and prolonged distal peak latency (Palm, L5.0, R5.5 ms).  All remaining nerves (as indicated in the following tables) were within normal limits.  All left vs. right side differences were within normal limits.    All examined muscles (as indicated in the following table) showed no evidence of electrical instability.    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   There is no significant electrodiagnostic evidence of any other focal nerve entrapment, brachial plexopathy, cervical radiculopathy or generalized peripheral neuropathy.   ** As you know, this particular electrodiagnostic study cannot rule out chemical radiculitis  or sensory only radiculopathy.  Recommendations: 1.  Follow-up with referring physician. 2.  Continue current management of symptoms. 3.  *Suggest surgical evaluation for the bilateral median nerve entrapment.  From a standpoint of her neck pain I do feel like she is probably getting some pain from the central canal stenosis particular at C4-5.  I do not think the foraminal narrowing looks as bad on imaging as the report would suggest and I really do not think the foraminal narrowing is the issue here.  She could have some neck pain from facet arthritis as well.  ___________________________ Wonda Olds Board Certified, American Board of Physical Medicine and Rehabilitation    Nerve Conduction  Studies Anti Sensory Summary Table   Stim Site NR Peak (ms) Norm Peak (ms) P-T Amp (V) Norm P-T Amp Site1 Site2 Delta-P (ms) Dist (cm) Vel (m/s) Norm Vel (m/s)  Left Median Acr Palm Anti Sensory (2nd Digit)  33.7C  Wrist    *5.3 <3.6 *9.3 >10 Wrist Palm 0.3 0.0    Palm    *5.0 <2.0 9.1         Right Median Acr Palm Anti Sensory (2nd Digit)  33.1C  Wrist    *6.3 <3.6 *5.7 >10 Wrist Palm 0.8 0.0    Palm    *5.5 <2.0 3.9         Left Radial Anti Sensory (Base 1st Digit)  33.6C  Wrist    1.8 <3.1 38.7  Wrist Base 1st Digit 1.8 0.0    Right Radial Anti Sensory (Base 1st Digit)  32.6C  Wrist    2.0 <3.1 27.9  Wrist Base 1st Digit 2.0 0.0    Left Ulnar Anti Sensory (5th Digit)  33.8C  Wrist    2.8 <3.7 21.4 >15.0 Wrist 5th Digit 2.8 14.0 50 >38  Right Ulnar Anti Sensory (5th Digit)  32.8C  Wrist    3.1 <3.7 20.2 >15.0 Wrist 5th Digit 3.1 14.0 45 >38   Motor Summary Table   Stim Site NR Onset (ms) Norm Onset (ms) O-P Amp (mV) Norm O-P Amp Site1 Site2 Delta-0 (ms) Dist (cm) Vel (m/s) Norm Vel (m/s)  Left Median Motor (Abd Poll Brev)  33.6C    Martin-Gruber  Wrist    *5.8 <4.2 6.5 >5 Elbow Wrist 5.1 21.0 *41 >50  Elbow    10.9  5.1         Right Median Motor (Abd Poll Brev)  32.5C  Wrist    *6.3 <4.2 5.9 >5 Elbow Wrist 4.4 18.5 *42 >50  Elbow    10.7  6.4         Left Ulnar Motor (Abd Dig Min)  33.6C  Wrist    3.0 <4.2 7.3 >3 B Elbow Wrist 3.2 17.5 55 >53  B Elbow    6.2  5.7  A Elbow B Elbow 1.5 9.0 60 >53  A Elbow    7.7  5.3         Right Ulnar Motor (Abd Dig Min)  32.6C  Wrist    2.7 <4.2 6.3 >3 B Elbow Wrist 3.2 18.5 58 >53  B Elbow    5.9  5.7  A Elbow B Elbow 1.3 10.0 77 >53  A Elbow    7.2  5.6          EMG   Side Muscle Nerve Root Ins Act Fibs Psw Amp Dur Poly Recrt Int Fraser Din Comment  Right 1stDorInt Ulnar C8-T1 Nml Nml Nml Nml Nml 0  Nml Nml   Right Abd Poll Brev Median C8-T1 Nml Nml Nml Nml Nml 0 Nml Nml   Right ExtDigCom   Nml Nml Nml Nml Nml 0 Nml Nml   Right  Triceps Radial C6-7-8 Nml Nml Nml Nml Nml 0 Nml Nml   Right Deltoid Axillary C5-6 Nml Nml Nml Nml Nml 0 Nml Nml     Nerve Conduction Studies Anti Sensory Left/Right Comparison   Stim Site L Lat (ms) R Lat (ms) L-R Lat (ms) L Amp (V) R Amp (V) L-R Amp (%) Site1 Site2 L Vel (m/s) R Vel (m/s) L-R Vel (m/s)  Median Acr Palm Anti Sensory (2nd Digit)  33.7C  Wrist *5.3 *6.3 1.0 *9.3 *5.7 38.7 Wrist Palm     Palm *5.0 *5.5 0.5 9.1 3.9 57.1       Radial Anti Sensory (Base 1st Digit)  33.6C  Wrist 1.8 2.0 0.2 38.7 27.9 27.9 Wrist Base 1st Digit     Ulnar Anti Sensory (5th Digit)  33.8C  Wrist 2.8 3.1 0.3 21.4 20.2 5.6 Wrist 5th Digit 50 45 5   Motor Left/Right Comparison   Stim Site L Lat (ms) R Lat (ms) L-R Lat (ms) L Amp (mV) R Amp (mV) L-R Amp (%) Site1 Site2 L Vel (m/s) R Vel (m/s) L-R Vel (m/s)  Median Motor (Abd Poll Brev)  33.6C    Martin-Gruber  Wrist *5.8 *6.3 0.5 6.5 5.9 9.2 Elbow Wrist *41 *42 1  Elbow 10.9 10.7 0.2 5.1 6.4 20.3       Ulnar Motor (Abd Dig Min)  33.6C  Wrist 3.0 2.7 0.3 7.3 6.3 13.7 B Elbow Wrist 55 58 3  B Elbow 6.2 5.9 0.3 5.7 5.7 0.0 A Elbow B Elbow 60 77 17  A Elbow 7.7 7.2 0.5 5.3 5.6 5.4          Waveforms:                      Clinical History: MRI CERVICAL SPINE WITHOUT CONTRAST  TECHNIQUE: Multiplanar, multisequence MR imaging of the cervical spine was performed. No intravenous contrast was administered.  COMPARISON:  Radiography 03/06/2019  FINDINGS: Alignment: Normal except for 2 mm degenerative anterolisthesis C4-5.  Vertebrae: No fracture or primary bone lesion.  Cord: No cord compression or primary cord lesion.  Posterior Fossa, vertebral arteries, paraspinal tissues: Normal  Disc levels:  No abnormality at the foramen magnum or C1-2.  C2-3: No disc pathology. Bilateral facet osteoarthritis which could be painful. No compressive stenosis.  C3-4: Mild bulging of the disc. Bilateral facet osteoarthritis.  No canal stenosis. Mild bilateral foraminal stenosis.  C4-5: Degenerative spondylosis with endplate osteophytes and bulging of the disc. 2 mm degenerative anterolisthesis. Bilateral facet degeneration and hypertrophy. Canal narrowing with effacement of the subarachnoid space surrounding the cord but no cord compression. Bilateral foraminal stenosis that could compress either C5 nerve, worse on the right than left.  C5-6: Spondylosis with endplate osteophytes and bulging of the disc. Narrowing of the ventral subarachnoid space but no compression of the cord. Bilateral foraminal narrowing could affect either C6 nerve.  C6-7: Endplate osteophytes and bulging of the disc. Narrowing of the ventral subarachnoid space but no compression of the cord. Bilateral foraminal narrowing could affect either C7 nerve.  C7-T1: Bulging of the disc. Mild facet degeneration. No compressive canal or foraminal narrowing.  IMPRESSION: C2-3: Bilateral facet osteoarthritis which could be painful. No neural compression.  C3-4: Bilateral facet osteoarthritis with mild foraminal narrowing but  no gross neural compression.  C4-5: Facet osteoarthritis with 2 mm of anterolisthesis. Spondylosis. Foraminal narrowing left worse than right that could affect either C5 nerve. Canal narrowing with effacement of the subarachnoid space but no demonstrable cord compression.  C5-6: Spondylosis with bilateral foraminal narrowing that could affect either C6 nerve.  C6-7: Spondylosis with bilateral foraminal narrowing that could affect either C7 nerve.   Electronically Signed   By: Nelson Chimes M.D.   On: 03/17/2019 08:53   She reports that she is a non-smoker but has been exposed to tobacco smoke. She has never used smokeless tobacco. No results for input(s): HGBA1C, LABURIC in the last 8760 hours.  Objective:  VS:  HT:    WT:   BMI:     BP:   HR: bpm  TEMP: ( )  RESP:  Physical Exam Vitals and  nursing note reviewed.  Constitutional:      General: She is not in acute distress.    Appearance: Normal appearance. She is well-developed. She is not ill-appearing.  HENT:     Head: Normocephalic and atraumatic.  Eyes:     Conjunctiva/sclera: Conjunctivae normal.     Pupils: Pupils are equal, round, and reactive to light.  Cardiovascular:     Rate and Rhythm: Normal rate.     Pulses: Normal pulses.  Pulmonary:     Effort: Pulmonary effort is normal.  Musculoskeletal:        General: No swelling, tenderness or deformity.     Right lower leg: No edema.     Left lower leg: No edema.     Comments: Cervical spine exam shows forward flexed cervical spine with pain at end ranges of rotation a little bit more left than right.  She has some tender points and very mild focal trigger points but these do not reproduce all of her pain.  She has some impingement in the right shoulder more than left.  Examination of the hands shows no swelling or allodynia or temperature change or sweating.  There is no atrophy in the bilateral APB or FDI or hand intrinsics.  She has intact sensation in both hands with equivocally positive Phalen's.  She has a negative Hoffmann's test bilaterally.  Skin:    General: Skin is warm and dry.     Findings: No erythema or rash.  Neurological:     General: No focal deficit present.     Mental Status: She is alert and oriented to person, place, and time.     Sensory: No sensory deficit.     Motor: No weakness or abnormal muscle tone.     Coordination: Coordination normal.     Gait: Gait normal.  Psychiatric:        Mood and Affect: Mood normal.        Behavior: Behavior normal.     Ortho Exam Imaging: No results found.  Past Medical/Family/Surgical/Social History: Medications & Allergies reviewed per EMR, new medications updated. Patient Active Problem List   Diagnosis Date Noted  . 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 Chronic left shoulder pain  08/23/2018  . Chronic diarrhea 12/07/2014  . Segmental colitis with rectal bleeding ([place]) 09/12/2014  . Routine general medical examination at a health care facility 06/05/2014  . Overweight 06/13/2011  . ABNORMAL CHEST XRAY 09/30/2007  . Hyperlipidemia 02/17/2007  . Essential hypertension 01/08/2007  . Osteoarthritis 01/08/2007   Past Medical History:  Diagnosis Date  . Arthritis    back, fingers with joint pain  and swelling.  chronic back pain  . Chronic back pain    arthritis   . Diverticulosis   . Elevated cholesterol    takes Niacin daily and Simvastatin  . GERD (gastroesophageal reflux disease) 06/2004   non-specific gastritis on EGD 06/2004  . Headache(784.0)    occasionally  . History of colon polyps 2004, 2009   2004:adenomatous. 2009 hyperplastic.   Marland Kitchen History of hiatal hernia   . Hypertension    takes Tenoretic and Lisinopril daily  . Mucoid cyst of joint 08/2013   right index finger  . Osteopenia 01/2014   T score -1.1 FRAX 14%/0.5%. Stable from prior DEXA  . PONV (postoperative nausea and vomiting)   . Rotator cuff arthropathy    Left  . Seasonal allergies   . Stroke ([place]) 1998   x 2 - mild left-sided weakness  . Urge incontinence   . Uterine prolapse    Family History  Problem Relation Age of Onset  . Hypertension Mother   . Heart disease Mother   . Diabetes Father   . Hypertension Father   . Heart disease Father   . Stroke Father   . Diabetes Maternal Aunt   . Diabetes Paternal Grandmother   . Hypertension Paternal Grandfather   . Stroke Paternal Grandfather    Past Surgical History:  Procedure Laterality Date  . BACK SURGERY     x 2  . BELPHAROPTOSIS REPAIR Bilateral 12/14/2017  . CATARACT EXTRACTION    . CATARACT EXTRACTION Bilateral 10/2017  . COLONOSCOPY  2004, 2009, 2014  . FINGER SURGERY    . GUM SURGERY    . GYNECOLOGIC CRYOSURGERY    . HYSTEROSCOPY WITH D & C  12/07/2010   with resection of endometrial polyp  . JOINT REPLACEMENT      . KNEE ARTHROSCOPY Left 2008  . lip biopsy     done at MD office Fri 11/22/13  . MASS EXCISION Right 08/15/2013   Procedure: RIGHT INDEX EXCISION MASS ;  Surgeon: Tennis Must, MD;  Location: [facility];  Service: Orthopedics;  Laterality: Right;  . NASAL SEPTUM SURGERY    . OOPHORECTOMY Right 2000  . REVERSE SHOULDER ARTHROPLASTY Left 12/11/2018  . REVERSE SHOULDER ARTHROPLASTY Left 12/11/2018   Procedure: LEFT REVERSE SHOULDER ARTHROPLASTY;  Surgeon: Meredith Pel, MD;  Location: [facility];  Service: Orthopedics;  Laterality: Left;  . SHOULDER ARTHROSCOPY WITH OPEN ROTATOR CUFF REPAIR AND DISTAL CLAVICLE ACROMINECTOMY Right 11/26/2013   Procedure: RIGHT SHOULDER ARTHROSCOPY WITH MINI OPEN ROTATOR CUFF REPAIR AND DISTAL CLAVICLE RESECTION, SUBACROMIAL DECOMPRESSION, POSSIBLE [facility] PATCH.;  Surgeon: [name], MD;  Location: [facility];  Service: Orthopedics;  Laterality: Right;  . TOTAL KNEE ARTHROPLASTY Right 03/21/2017  . TOTAL KNEE ARTHROPLASTY Right 03/21/2017   Procedure: RIGHT TOTAL KNEE ARTHROPLASTY;  Surgeon: [name], MD;  Location: [facility];  Service: Orthopedics;  Laterality: Right;  . TUBAL LIGATION     Social History   Occupational History  . Occupation: Animal nutritionist    Comment: Disability/Retired  Tobacco Use  . Smoking status: Passive Smoke Exposure - Never Smoker  . Smokeless tobacco: Never Used  Substance and Sexual Activity  . Alcohol use: Not Currently    Alcohol/week: 0.0 standard drinks    Comment: about 2-3 times a year  . Drug use: No  . Sexual activity: Yes    Partners: Male    Birth control/protection: Surgical, Post-menopausal    Comment: BTL-1st intercourse  69 yo-More than 5 partners

## 2019-04-22 NOTE — Procedures (Signed)
EMG & NCV Findings: Evaluation of the left median motor and the right median motor nerves showed prolonged distal onset latency (L5.8, R6.3 ms) and decreased conduction velocity (Elbow-Wrist, L41, R42 m/s).  The left median (across palm) sensory and the right median (across palm) sensory nerves showed prolonged distal peak latency (Wrist, L5.3, R6.3 ms), reduced amplitude (L9.3, R5.7 V), and prolonged distal peak latency (Palm, L5.0, R5.5 ms).  All remaining nerves (as indicated in the following tables) were within normal limits.  All left vs. right side differences were within normal limits.    All examined muscles (as indicated in the following table) showed no evidence of electrical instability.    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   There is no significant electrodiagnostic evidence of any other focal nerve entrapment, brachial plexopathy, cervical radiculopathy or generalized peripheral neuropathy.   ** As you know, this particular electrodiagnostic study cannot rule out chemical radiculitis or sensory only radiculopathy.  Recommendations: 1.  Follow-up with referring physician. 2.  Continue current management of symptoms. 3.  *Suggest surgical evaluation for the bilateral median nerve entrapment.  From a standpoint of her neck pain I do feel like she is probably getting some pain from the central canal stenosis particular at C4-5.  I do not think the foraminal narrowing looks as bad on imaging as the report would suggest and I really do not think the foraminal narrowing is the issue here.  She could have some neck pain from facet arthritis as well.  ___________________________ Wonda Olds Board Certified, American Board of Physical Medicine and Rehabilitation    Nerve Conduction Studies Anti Sensory  Summary Table   Stim Site NR Peak (ms) Norm Peak (ms) P-T Amp (V) Norm P-T Amp Site1 Site2 Delta-P (ms) Dist (cm) Vel (m/s) Norm Vel (m/s)  Left Median Acr Palm Anti Sensory (2nd Digit)  33.7C  Wrist    *5.3 <3.6 *9.3 >10 Wrist Palm 0.3 0.0    Palm    *5.0 <2.0 9.1         Right Median Acr Palm Anti Sensory (2nd Digit)  33.1C  Wrist    *6.3 <3.6 *5.7 >10 Wrist Palm 0.8 0.0    Palm    *5.5 <2.0 3.9         Left Radial Anti Sensory (Base 1st Digit)  33.6C  Wrist    1.8 <3.1 38.7  Wrist Base 1st Digit 1.8 0.0    Right Radial Anti Sensory (Base 1st Digit)  32.6C  Wrist    2.0 <3.1 27.9  Wrist Base 1st Digit 2.0 0.0    Left Ulnar Anti Sensory (5th Digit)  33.8C  Wrist    2.8 <3.7 21.4 >15.0 Wrist 5th Digit 2.8 14.0 50 >38  Right Ulnar Anti Sensory (5th Digit)  32.8C  Wrist    3.1 <3.7 20.2 >15.0 Wrist 5th Digit 3.1 14.0 45 >38   Motor Summary Table   Stim Site NR Onset (ms) Norm Onset (ms) O-P Amp (mV) Norm O-P Amp Site1 Site2 Delta-0 (ms) Dist (cm) Vel (m/s) Norm Vel (m/s)  Left Median Motor (Abd Poll Brev)  33.6C    Martin-Gruber  Wrist    *5.8 <4.2 6.5 >5 Elbow Wrist 5.1 21.0 *41 >50  Elbow    10.9  5.1         Right Median Motor (Abd  Poll Brev)  32.5C  Wrist    *6.3 <4.2 5.9 >5 Elbow Wrist 4.4 18.5 *42 >50  Elbow    10.7  6.4         Left Ulnar Motor (Abd Dig Min)  33.6C  Wrist    3.0 <4.2 7.3 >3 B Elbow Wrist 3.2 17.5 55 >53  B Elbow    6.2  5.7  A Elbow B Elbow 1.5 9.0 60 >53  A Elbow    7.7  5.3         Right Ulnar Motor (Abd Dig Min)  32.6C  Wrist    2.7 <4.2 6.3 >3 B Elbow Wrist 3.2 18.5 58 >53  B Elbow    5.9  5.7  A Elbow B Elbow 1.3 10.0 77 >53  A Elbow    7.2  5.6          EMG   Side Muscle Nerve Root Ins Act Fibs Psw Amp Dur Poly Recrt Int Fraser Din Comment  Right 1stDorInt Ulnar C8-T1 Nml Nml Nml Nml Nml 0 Nml Nml   Right Abd Poll Brev Median C8-T1 Nml Nml Nml Nml Nml 0 Nml Nml   Right ExtDigCom   Nml Nml Nml Nml Nml 0 Nml Nml   Right Triceps Radial C6-7-8 Nml  Nml Nml Nml Nml 0 Nml Nml   Right Deltoid Axillary C5-6 Nml Nml Nml Nml Nml 0 Nml Nml     Nerve Conduction Studies Anti Sensory Left/Right Comparison   Stim Site L Lat (ms) R Lat (ms) L-R Lat (ms) L Amp (V) R Amp (V) L-R Amp (%) Site1 Site2 L Vel (m/s) R Vel (m/s) L-R Vel (m/s)  Median Acr Palm Anti Sensory (2nd Digit)  33.7C  Wrist *5.3 *6.3 1.0 *9.3 *5.7 38.7 Wrist Palm     Palm *5.0 *5.5 0.5 9.1 3.9 57.1       Radial Anti Sensory (Base 1st Digit)  33.6C  Wrist 1.8 2.0 0.2 38.7 27.9 27.9 Wrist Base 1st Digit     Ulnar Anti Sensory (5th Digit)  33.8C  Wrist 2.8 3.1 0.3 21.4 20.2 5.6 Wrist 5th Digit 50 45 5   Motor Left/Right Comparison   Stim Site L Lat (ms) R Lat (ms) L-R Lat (ms) L Amp (mV) R Amp (mV) L-R Amp (%) Site1 Site2 L Vel (m/s) R Vel (m/s) L-R Vel (m/s)  Median Motor (Abd Poll Brev)  33.6C    Martin-Gruber  Wrist *5.8 *6.3 0.5 6.5 5.9 9.2 Elbow Wrist *41 *42 1  Elbow 10.9 10.7 0.2 5.1 6.4 20.3       Ulnar Motor (Abd Dig Min)  33.6C  Wrist 3.0 2.7 0.3 7.3 6.3 13.7 B Elbow Wrist 55 58 3  B Elbow 6.2 5.9 0.3 5.7 5.7 0.0 A Elbow B Elbow 60 77 17  A Elbow 7.7 7.2 0.5 5.3 5.6 5.4          Waveforms:

## 2019-04-23 ENCOUNTER — Other Ambulatory Visit: Payer: Self-pay

## 2019-04-23 ENCOUNTER — Ambulatory Visit: Payer: Medicare Other | Admitting: Physical Therapy

## 2019-04-23 ENCOUNTER — Encounter: Payer: Self-pay | Admitting: Physical Therapy

## 2019-04-23 DIAGNOSIS — M25612 Stiffness of left shoulder, not elsewhere classified: Secondary | ICD-10-CM

## 2019-04-23 DIAGNOSIS — R262 Difficulty in walking, not elsewhere classified: Secondary | ICD-10-CM | POA: Diagnosis not present

## 2019-04-23 DIAGNOSIS — M25572 Pain in left ankle and joints of left foot: Secondary | ICD-10-CM

## 2019-04-23 DIAGNOSIS — M25512 Pain in left shoulder: Secondary | ICD-10-CM | POA: Diagnosis not present

## 2019-04-23 DIAGNOSIS — M6281 Muscle weakness (generalized): Secondary | ICD-10-CM

## 2019-04-23 NOTE — Therapy (Signed)
[facility] [address] [place], [address] Phone: [phone]   Fax:  [phone]  Physical Therapy Treatment  Patient Details  Name: [name] MRN: [id number] Date of Birth: Mar 30, 1951 No data recorded  Encounter Date: 04/23/2019  PT End of Session - 04/23/19 1356    Visit Number  33    Number of Visits  39    Date for PT Re-Evaluation  05/13/19    Authorization Type  medicare    Authorization - Visit Number  6    Authorization - Number of Visits  10    PT Start Time  0148    PT Stop Time  0230    PT Time Calculation (min)  42 min    Activity Tolerance  Patient tolerated treatment well;No increased pain    Behavior During Therapy  WFL for tasks assessed/performed       Past Medical History:  Diagnosis Date  . Arthritis    back, fingers with joint pain and swelling.  chronic back pain  . Chronic back pain    arthritis   . Diverticulosis   . Elevated cholesterol    takes Niacin daily and Simvastatin  . GERD (gastroesophageal reflux disease) 06/2004   non-specific gastritis on EGD 06/2004  . Headache(784.0)    occasionally  . History of colon polyps 2004, 2009   2004:adenomatous. 2009 hyperplastic.   Marland Kitchen History of hiatal hernia   . Hypertension    takes Tenoretic and Lisinopril daily  . Mucoid cyst of joint 08/2013   right index finger  . Osteopenia 01/2014   T score -1.1 FRAX 14%/0.5%. Stable from prior DEXA  . PONV (postoperative nausea and vomiting)   . Rotator cuff arthropathy    Left  . Seasonal allergies   . Stroke ([facility]) 1998   x 2 - mild left-sided weakness  . Urge incontinence   . Uterine prolapse     Past Surgical History:  Procedure Laterality Date  . BACK SURGERY     x 2  . BELPHAROPTOSIS REPAIR Bilateral 12/14/2017  . CATARACT EXTRACTION    . CATARACT EXTRACTION Bilateral 10/2017  . COLONOSCOPY  2004, 2009, 2014  . FINGER SURGERY    . GUM SURGERY    .  GYNECOLOGIC CRYOSURGERY    . HYSTEROSCOPY WITH D & C  12/07/2010   with resection of endometrial polyp  . JOINT REPLACEMENT    . KNEE ARTHROSCOPY Left 2008  . lip biopsy     done at MD office Fri 11/22/13  . MASS EXCISION Right 08/15/2013   Procedure: RIGHT INDEX EXCISION MASS ;  Surgeon: Tennis Must, MD;  Location: [facility];  Service: Orthopedics;  Laterality: Right;  . NASAL SEPTUM SURGERY    . OOPHORECTOMY Right 2000  . REVERSE SHOULDER ARTHROPLASTY Left 12/11/2018  . REVERSE SHOULDER ARTHROPLASTY Left 12/11/2018   Procedure: LEFT REVERSE SHOULDER ARTHROPLASTY;  Surgeon: Meredith Pel, MD;  Location: [facility];  Service: Orthopedics;  Laterality: Left;  . SHOULDER ARTHROSCOPY WITH OPEN ROTATOR CUFF REPAIR AND DISTAL CLAVICLE ACROMINECTOMY Right 11/26/2013   Procedure: RIGHT SHOULDER ARTHROSCOPY WITH MINI OPEN ROTATOR CUFF REPAIR AND DISTAL CLAVICLE RESECTION, SUBACROMIAL DECOMPRESSION, POSSIBLE [facility] PATCH.;  Surgeon: [name], MD;  Location: [facility];  Service: Orthopedics;  Laterality: Right;  . TOTAL KNEE ARTHROPLASTY Right 03/21/2017  . TOTAL KNEE ARTHROPLASTY Right 03/21/2017   Procedure: RIGHT TOTAL KNEE ARTHROPLASTY;  Surgeon: Joni Fears  W, MD;  Location: [facility];  Service: Orthopedics;  Laterality: Right;  . TUBAL LIGATION      There were no vitals filed for this visit.  Subjective Assessment - 04/23/19 1349    Subjective  Reports some soreness continuing at lateral shoulder, that is not super diilitating. GOod compliance with HEP.    Pertinent History  Pt is a 69 year old female s/p L reverse total shoulder 12/11/18. Reports she was immobilized in her sling for 2 weeks. After immobilization has been using passive motion machine for flexion. Has been doing AAROM flexion/abd/ER, bicep, shrugs, and forward punches with 1# with HHPT over the past week. MD Marlou Sa advised patient for out of sling, AAROM, PROM with no lifting of more than 5# until next followup  with him (mid Oct). Pt reports only 2/10 at rest, 6/10 with motion. Pain is at the medial upper arm and in the armpit. Pain is worse during the evening after moving it all day. Patient lives with her husband and her 4 dogs, which she walks frequently and cares for. She is R handed.Pt denies N/V, B&B changes, unexplained weight fluctuation, saddle paresthesia, fever, night sweats, or unrelenting night pain at this time.Pt denies N/V, B&B changes, unexplained weight fluctuation, saddle paresthesia, fever, night sweats, or unrelenting night pain at this time.    Limitations  House hold activities;Lifting    How long can you sit comfortably?  unlimited    How long can you stand comfortably?  unlimited    How long can you walk comfortably?  unlimited    Diagnostic tests  Xrays    Patient Stated Goals  Being able to complete daily tasks    Pain Onset  1 to 4 weeks ago           Ther-Ex -UBE L1resistance4mn forward;265m backward - L shoulder ext GTB 3x 10 with cuing for scapulo-humeral rhythm with good carry over - Standing 90/90 bilat shoulder abduction x10; seated with 1# DB 2x 8 with some TC for maintained ER with decent carry over - Military press BW 3x 6 with cuing to maintain scap retraction and prevent L shoulder hiking, decent carry over - OMEGA incline chest press 10# 3x 10 with cuing to prevent shoulder hiking with good carry over following - Supine snow angel 2x 10 with cuing to maintain scapular retraction and contact with mat table with good carry over                  PT Education - 04/23/19 1355    Education Details  therex form    Person(s) Educated  Patient    Methods  Explanation;Demonstration;Verbal cues    Comprehension  Verbalized understanding;Returned demonstration;Verbal cues required       PT Short Term Goals - 02/13/19 1308      PT SHORT TERM GOAL #1   Title  Pt will be independent with HEP in order to improve strength and balance in order to  improve function at home    Baseline  01/02/19    Time  4    Period  Weeks    Status  Achieved        PT Long Term Goals - 04/01/19 1519      PT LONG TERM GOAL #1   Title  Pt will decrease quick DASH score by at least 8% in order to demonstrate clinically significant reduction in disability.    Baseline  02/25/19 56.8%    Time  8  Period  Weeks    Status  Achieved      PT LONG TERM GOAL #2   Title  Patient will demonstrate full AROM in L shoulder in order to complete household ADLs    Baseline  eval 74d Flexion, 58* ABDCT: 02/13/19: LUE shoulder fleixon 126*, abduction 95*; 02/25/19 120* Flexion, 95* ABDCT, IR  L PSIS, ER C7; 04/01/19 flex 121d abd 110d IR L PSIS ER C8    Time  8    Period  Weeks    Status  On-going      PT LONG TERM GOAL #3   Title  Pt will decrease worst pain as reported on NPRS by at least 3 points in order to demonstrate clinically significant reduction in pain.    Baseline  02/25/19 2/10 pain with movement; 04/01/19 3/10 with overhead motion    Time  8    Period  Weeks    Status  Achieved      PT LONG TERM GOAL #4   Title  Pt will demonstrate gross bilat shoulder and periscapular strength of 4/10 in order to safely lift for heavy household chores    Baseline  04/01/19 R/L  shoulder flex: 4+/4 abd: 4+/4+ IR: 4-/4 ER: 3+/4 ext: Periscap Y 3+/3+ T 4/4  I 4+/4 02/25/19 R/L  shoulder flex: 4+/4 abd: 4+/4+ IR: 3+/4 ER: 3+/4 ext: Periscap Y 3+/3- T 4/4-  I 4+/4- ; 01/02/19 see eval    Time  8    Period  Weeks    Status  On-going      PT LONG TERM GOAL #5   Title  Patient will be able to carry 30lb backpack 227f in order to demonstrate safety with taking her hunting equipment into the woods    Baseline  04/01/19 10# DB farmers carry with fatigue at 1032fand some increased pain    Time  6    Period  Weeks    Status  New            Plan - 04/23/19 1407    Clinical Impression Statement  PT continued therex progression for scapular and shoulder  strengthening with good success. Patient is able to demonstrate good carry over following cuing, but is continuing to have difficulty initiating proper technique. Motion is improving, with continued strength improvements, but difficulty espeically with overhead strengthening. PT will continue progression as able.    Personal Factors and Comorbidities  Age;Sex;Behavior Pattern;Comorbidity 1;Fitness    Comorbidities  HTN    Examination-Activity Limitations  Dressing;Carry;Reach Overhead;Lift    Examination-Participation Restrictions  Driving;Community Activity;Cleaning    Stability/Clinical Decision Making  Evolving/Moderate complexity    Clinical Decision Making  Moderate    Rehab Potential  Good    PT Frequency  2x / week    PT Duration  8 weeks    PT Treatment/Interventions  ADLs/Self Care Home Management;Electrical Stimulation;Therapeutic activities;Passive range of motion;Manual techniques;Patient/family education;Therapeutic exercise;Iontophoresis 38m71ml Dexamethasone;Moist Heat;Traction;Cryotherapy;Ultrasound;Functional mobility training;Neuromuscular re-education;Balance training;Dry needling;Joint Manipulations    PT Next Visit Plan  Continue to gently progress mobility and strength    PT Home Exercise Plan  [id number]    Consulted and Agree with Plan of Care  Patient       Patient will benefit from skilled therapeutic intervention in order to improve the following deficits and impairments:  Decreased mobility, Decreased endurance, Pain, Postural dysfunction, Impaired UE functional use, Impaired flexibility, Increased fascial restricitons, Decreased strength, Decreased coordination, Decreased range of motion, Improper body mechanics  Visit  Diagnosis: Stiffness of left shoulder, not elsewhere classified  Acute pain of left shoulder  Muscle weakness (generalized)  Difficulty in walking, not elsewhere classified  Pain in left ankle and joints of left foot     Problem List Patient  Active Problem List   Diagnosis Date Noted  . 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 Chronic left shoulder pain 08/23/2018  . Chronic diarrhea 12/07/2014  . Segmental colitis with rectal bleeding ([place]) 09/12/2014  . Routine general medical examination at a health care facility 06/05/2014  . Overweight 06/13/2011  . ABNORMAL CHEST XRAY 09/30/2007  . Hyperlipidemia 02/17/2007  . Essential hypertension 01/08/2007  . Osteoarthritis 01/08/2007   Shelton Silvas PT, DPT Shelton Silvas 04/23/2019, 2:31 PM  [facility] [address], [place], [address] Phone: [phone]   Fax:  [phone]  Name: ALAYSIA LIGHTLE MRN: [id number] Date of Birth: [birth year]

## 2019-04-25 ENCOUNTER — Ambulatory Visit: Payer: Medicare Other | Admitting: Physical Therapy

## 2019-04-25 ENCOUNTER — Encounter: Payer: Self-pay | Admitting: Physical Therapy

## 2019-04-25 ENCOUNTER — Other Ambulatory Visit: Payer: Self-pay

## 2019-04-25 DIAGNOSIS — M6281 Muscle weakness (generalized): Secondary | ICD-10-CM

## 2019-04-25 DIAGNOSIS — R262 Difficulty in walking, not elsewhere classified: Secondary | ICD-10-CM

## 2019-04-25 DIAGNOSIS — M25512 Pain in left shoulder: Secondary | ICD-10-CM | POA: Diagnosis not present

## 2019-04-25 DIAGNOSIS — M25612 Stiffness of left shoulder, not elsewhere classified: Secondary | ICD-10-CM | POA: Diagnosis not present

## 2019-04-25 DIAGNOSIS — M25572 Pain in left ankle and joints of left foot: Secondary | ICD-10-CM | POA: Diagnosis not present

## 2019-04-25 NOTE — Therapy (Signed)
[facility] [address] [place], [address] Phone: [phone]   Fax:  [phone]  Physical Therapy Treatment  Patient Details  Name: [name] MRN: [id number] Date of Birth: 20-Nov-1950 No data recorded  Encounter Date: 04/25/2019  PT End of Session - 04/25/19 1418    Visit Number  34    Number of Visits  39    Date for PT Re-Evaluation  05/13/19    Authorization Type  medicare    Authorization - Visit Number  7    Authorization - Number of Visits  10    PT Start Time  0215    PT Stop Time  0300    PT Time Calculation (min)  45 min    Activity Tolerance  Patient tolerated treatment well;No increased pain    Behavior During Therapy  WFL for tasks assessed/performed       Past Medical History:  Diagnosis Date  . Arthritis    back, fingers with joint pain and swelling.  chronic back pain  . Chronic back pain    arthritis   . Diverticulosis   . Elevated cholesterol    takes Niacin daily and Simvastatin  . GERD (gastroesophageal reflux disease) 06/2004   non-specific gastritis on EGD 06/2004  . Headache(784.0)    occasionally  . History of colon polyps 2004, 2009   2004:adenomatous. 2009 hyperplastic.   Marland Kitchen History of hiatal hernia   . Hypertension    takes Tenoretic and Lisinopril daily  . Mucoid cyst of joint 08/2013   right index finger  . Osteopenia 01/2014   T score -1.1 FRAX 14%/0.5%. Stable from prior DEXA  . PONV (postoperative nausea and vomiting)   . Rotator cuff arthropathy    Left  . Seasonal allergies   . Stroke ([place]) 1998   x 2 - mild left-sided weakness  . Urge incontinence   . Uterine prolapse     Past Surgical History:  Procedure Laterality Date  . BACK SURGERY     x 2  . BELPHAROPTOSIS REPAIR Bilateral 12/14/2017  . CATARACT EXTRACTION    . CATARACT EXTRACTION Bilateral 10/2017  . COLONOSCOPY  2004, 2009, 2014  . FINGER SURGERY    . GUM SURGERY    .  GYNECOLOGIC CRYOSURGERY    . HYSTEROSCOPY WITH D & C  12/07/2010   with resection of endometrial polyp  . JOINT REPLACEMENT    . KNEE ARTHROSCOPY Left 2008  . lip biopsy     done at MD office Fri 11/22/13  . MASS EXCISION Right 08/15/2013   Procedure: RIGHT INDEX EXCISION MASS ;  Surgeon: Tennis Must, MD;  Location: [facility];  Service: Orthopedics;  Laterality: Right;  . NASAL SEPTUM SURGERY    . OOPHORECTOMY Right 2000  . REVERSE SHOULDER ARTHROPLASTY Left 12/11/2018  . REVERSE SHOULDER ARTHROPLASTY Left 12/11/2018   Procedure: LEFT REVERSE SHOULDER ARTHROPLASTY;  Surgeon: Meredith Pel, MD;  Location: [facility];  Service: Orthopedics;  Laterality: Left;  . SHOULDER ARTHROSCOPY WITH OPEN ROTATOR CUFF REPAIR AND DISTAL CLAVICLE ACROMINECTOMY Right 11/26/2013   Procedure: RIGHT SHOULDER ARTHROSCOPY WITH MINI OPEN ROTATOR CUFF REPAIR AND DISTAL CLAVICLE RESECTION, SUBACROMIAL DECOMPRESSION, POSSIBLE [facility] PATCH.;  Surgeon: [name], MD;  Location: [facility] [place];  Service: Orthopedics;  Laterality: Right;  . TOTAL KNEE ARTHROPLASTY Right 03/21/2017  . TOTAL KNEE ARTHROPLASTY Right 03/21/2017   Procedure: RIGHT TOTAL KNEE ARTHROPLASTY;  Surgeon: Joni Fears  W, MD;  Location: [facility];  Service: Orthopedics;  Laterality: Right;  . TUBAL LIGATION      There were no vitals filed for this visit.  Subjective Assessment - 04/25/19 1416    Subjective  Reports she had massage therapy yesterday, which helped with her tension and "knots". Reports 3/10 pain/soreness today.    Pertinent History  Pt is a 69 year old female s/p L reverse total shoulder 12/11/18. Reports she was immobilized in her sling for 2 weeks. After immobilization has been using passive motion machine for flexion. Has been doing AAROM flexion/abd/ER, bicep, shrugs, and forward punches with 1# with HHPT over the past week. MD Marlou Sa advised patient for out of sling, AAROM, PROM with no lifting of more than 5# until next  followup with him (mid Oct). Pt reports only 2/10 at rest, 6/10 with motion. Pain is at the medial upper arm and in the armpit. Pain is worse during the evening after moving it all day. Patient lives with her husband and her 4 dogs, which she walks frequently and cares for. She is R handed.Pt denies N/V, B&B changes, unexplained weight fluctuation, saddle paresthesia, fever, night sweats, or unrelenting night pain at this time.Pt denies N/V, B&B changes, unexplained weight fluctuation, saddle paresthesia, fever, night sweats, or unrelenting night pain at this time.    Limitations  House hold activities;Lifting    How long can you sit comfortably?  unlimited    How long can you stand comfortably?  unlimited    Diagnostic tests  Xrays    Patient Stated Goals  Being able to complete daily tasks    Pain Onset  1 to 4 weeks ago          Ther-Ex -UBE L1resistance46mn forward;21m backward - Supine shoulder flex 3x 10 2# DB bilat with 1/2 foam at T5 for increased thoracic mobility with overhead motion, cervical discomfort mitagated with single pillow and cuing for chin tuck - Sidelying abd 3x 10 2# DB with cuing for scapular stability/depression with good carry over - Seated theraball rollouts x15 5sec hold for increased overhead mobility with good carry over - Seated miliary press 3x6 with min cuing for full elbow ext with overhead with good carry over - OMEGA incline chest press 10# 3x 10 with cuing to prevent shoulder hiking with good carry over following - Scaption with GTB (adduction force) 3x 10/8/7 with some cuing needed for proper technique with good carry over following                       PT Education - 04/25/19 1418    Education Details  therex form    Person(s) Educated  Patient    Methods  Explanation;Demonstration;Verbal cues    Comprehension  Verbalized understanding;Returned demonstration;Verbal cues required       PT Short Term Goals - 02/13/19 1308       PT SHORT TERM GOAL #1   Title  Pt will be independent with HEP in order to improve strength and balance in order to improve function at home    Baseline  01/02/19    Time  4    Period  Weeks    Status  Achieved        PT Long Term Goals - 04/01/19 1519      PT LONG TERM GOAL #1   Title  Pt will decrease quick DASH score by at least 8% in order to demonstrate clinically significant reduction in  disability.    Baseline  02/25/19 56.8%    Time  8    Period  Weeks    Status  Achieved      PT LONG TERM GOAL #2   Title  Patient will demonstrate full AROM in L shoulder in order to complete household ADLs    Baseline  eval 74d Flexion, 58* ABDCT: 02/13/19: LUE shoulder fleixon 126*, abduction 95*; 02/25/19 120* Flexion, 95* ABDCT, IR  L PSIS, ER C7; 04/01/19 flex 121d abd 110d IR L PSIS ER C8    Time  8    Period  Weeks    Status  On-going      PT LONG TERM GOAL #3   Title  Pt will decrease worst pain as reported on NPRS by at least 3 points in order to demonstrate clinically significant reduction in pain.    Baseline  02/25/19 2/10 pain with movement; 04/01/19 3/10 with overhead motion    Time  8    Period  Weeks    Status  Achieved      PT LONG TERM GOAL #4   Title  Pt will demonstrate gross bilat shoulder and periscapular strength of 4/10 in order to safely lift for heavy household chores    Baseline  04/01/19 R/L  shoulder flex: 4+/4 abd: 4+/4+ IR: 4-/4 ER: 3+/4 ext: Periscap Y 3+/3+ T 4/4  I 4+/4 02/25/19 R/L  shoulder flex: 4+/4 abd: 4+/4+ IR: 3+/4 ER: 3+/4 ext: Periscap Y 3+/3- T 4/4-  I 4+/4- ; 01/02/19 see eval    Time  8    Period  Weeks    Status  On-going      PT LONG TERM GOAL #5   Title  Patient will be able to carry 30lb backpack 213f in order to demonstrate safety with taking her hunting equipment into the woods    Baseline  04/01/19 10# DB farmers carry with fatigue at 1049fand some increased pain    Time  6    Period  Weeks    Status  New             Plan - 04/25/19 1429    Clinical Impression Statement  PT continued therex progression for motion and strengthening, with overhead mobility focus with good success. Patient motivated throughout session with good compliance to all cuing for proper form/muscle contraction. PT will continue progression as able.    Personal Factors and Comorbidities  Age;Sex;Behavior Pattern;Comorbidity 1;Fitness    Comorbidities  HTN    Examination-Activity Limitations  Dressing;Carry;Reach Overhead;Lift    Examination-Participation Restrictions  Driving;Community Activity;Cleaning    Stability/Clinical Decision Making  Evolving/Moderate complexity    Clinical Decision Making  Moderate    Rehab Potential  Good    PT Frequency  2x / week    PT Duration  8 weeks    PT Treatment/Interventions  ADLs/Self Care Home Management;Electrical Stimulation;Therapeutic activities;Passive range of motion;Manual techniques;Patient/family education;Therapeutic exercise;Iontophoresis 28m33ml Dexamethasone;Moist Heat;Traction;Cryotherapy;Ultrasound;Functional mobility training;Neuromuscular re-education;Balance training;Dry needling;Joint Manipulations    PT Next Visit Plan  Continue to gently progress mobility and strength    PT Home Exercise Plan  [id number]    Consulted and Agree with Plan of Care  Patient       Patient will benefit from skilled therapeutic intervention in order to improve the following deficits and impairments:  Decreased mobility, Decreased endurance, Pain, Postural dysfunction, Impaired UE functional use, Impaired flexibility, Increased fascial restricitons, Decreased strength, Decreased coordination, Decreased range of motion, Improper body mechanics  Visit Diagnosis: Stiffness of left shoulder, not elsewhere classified  Acute pain of left shoulder  Muscle weakness (generalized)  Difficulty in walking, not elsewhere classified     Problem List Patient Active Problem List   Diagnosis  Date Noted  . 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 Chronic left shoulder pain 08/23/2018  . Chronic diarrhea 12/07/2014  . Segmental colitis with rectal bleeding ([place]) 09/12/2014  . Routine general medical examination at a health care facility 06/05/2014  . Overweight 06/13/2011  . ABNORMAL CHEST XRAY 09/30/2007  . Hyperlipidemia 02/17/2007  . Essential hypertension 01/08/2007  . Osteoarthritis 01/08/2007   Shelton Silvas PT, DPT Shelton Silvas 04/25/2019, 2:56 PM  [facility] [address] [place], [address] Phone: [phone]   Fax:  [phone]  Name: [name] MRN: [id number] Date of Birth: 1950/09/29

## 2019-04-29 ENCOUNTER — Other Ambulatory Visit: Payer: Self-pay

## 2019-04-29 ENCOUNTER — Ambulatory Visit (INDEPENDENT_AMBULATORY_CARE_PROVIDER_SITE_OTHER): Payer: Medicare Other | Admitting: Orthopedic Surgery

## 2019-04-29 DIAGNOSIS — G5603 Carpal tunnel syndrome, bilateral upper limbs: Secondary | ICD-10-CM | POA: Diagnosis not present

## 2019-04-30 ENCOUNTER — Encounter: Payer: Self-pay | Admitting: Physical Therapy

## 2019-04-30 ENCOUNTER — Ambulatory Visit: Payer: Medicare Other | Admitting: Physical Therapy

## 2019-04-30 DIAGNOSIS — M25612 Stiffness of left shoulder, not elsewhere classified: Secondary | ICD-10-CM

## 2019-04-30 DIAGNOSIS — R262 Difficulty in walking, not elsewhere classified: Secondary | ICD-10-CM | POA: Diagnosis not present

## 2019-04-30 DIAGNOSIS — M25512 Pain in left shoulder: Secondary | ICD-10-CM

## 2019-04-30 DIAGNOSIS — M6281 Muscle weakness (generalized): Secondary | ICD-10-CM | POA: Diagnosis not present

## 2019-04-30 DIAGNOSIS — M25572 Pain in left ankle and joints of left foot: Secondary | ICD-10-CM | POA: Diagnosis not present

## 2019-04-30 NOTE — Therapy (Addendum)
[facility] [address], [place], [address] Phone: [phone]   Fax:  [phone]  Physical Therapy Treatment  Patient Details  Name: [name] MRN: [id number] Date of Birth: [birth year] No data recorded  Encounter Date: 04/30/2019  PT End of Session - 04/30/19 1436    Visit Number  35    Number of Visits  39    Date for PT Re-Evaluation  05/13/19    Authorization Type  medicare    Authorization - Visit Number  8    Authorization - Number of Visits  10    PT Start Time  2409    PT Stop Time  7353    PT Time Calculation (min)  40 min    Activity Tolerance  Patient tolerated treatment well;No increased pain    Behavior During Therapy  WFL for tasks assessed/performed       Past Medical History:  Diagnosis Date  . Arthritis    back, fingers with joint pain and swelling.  chronic back pain  . Chronic back pain    arthritis   . Diverticulosis   . Elevated cholesterol    takes Niacin daily and Simvastatin  . GERD (gastroesophageal reflux disease) 06/2004   non-specific gastritis on EGD 06/2004  . Headache(784.0)    occasionally  . History of colon polyps 2004, 2009   2004:adenomatous. 2009 hyperplastic.   Marland Kitchen History of hiatal hernia   . Hypertension    takes Tenoretic and Lisinopril daily  . Mucoid cyst of joint 08/2013   right index finger  . Osteopenia 01/2014   T score -1.1 FRAX 14%/0.5%. Stable from prior DEXA  . PONV (postoperative nausea and vomiting)   . Rotator cuff arthropathy    Left  . Seasonal allergies   . Stroke ([place]) 1998   x 2 - mild left-sided weakness  . Urge incontinence   . Uterine prolapse     Past Surgical History:  Procedure Laterality Date  . BACK SURGERY     x 2  . BELPHAROPTOSIS REPAIR Bilateral 12/14/2017  . CATARACT EXTRACTION    . CATARACT EXTRACTION Bilateral 10/2017  . COLONOSCOPY  2004, 2009, 2014  . FINGER SURGERY    . GUM SURGERY    .  GYNECOLOGIC CRYOSURGERY    . HYSTEROSCOPY WITH D & C  12/07/2010   with resection of endometrial polyp  . JOINT REPLACEMENT    . KNEE ARTHROSCOPY Left 2008  . lip biopsy     done at MD office Fri 11/22/13  . MASS EXCISION Right 08/15/2013   Procedure: RIGHT INDEX EXCISION MASS ;  Surgeon: Tennis Must, MD;  Location: [facility];  Service: Orthopedics;  Laterality: Right;  . NASAL SEPTUM SURGERY    . OOPHORECTOMY Right 2000  . REVERSE SHOULDER ARTHROPLASTY Left 12/11/2018  . REVERSE SHOULDER ARTHROPLASTY Left 12/11/2018   Procedure: LEFT REVERSE SHOULDER ARTHROPLASTY;  Surgeon: Meredith Pel, MD;  Location: [facility];  Service: Orthopedics;  Laterality: Left;  . SHOULDER ARTHROSCOPY WITH OPEN ROTATOR CUFF REPAIR AND DISTAL CLAVICLE ACROMINECTOMY Right 11/26/2013   Procedure: RIGHT SHOULDER ARTHROSCOPY WITH MINI OPEN ROTATOR CUFF REPAIR AND DISTAL CLAVICLE RESECTION, SUBACROMIAL DECOMPRESSION, POSSIBLE [facility] PATCH.;  Surgeon: [name], MD;  Location: [place];  Service: Orthopedics;  Laterality: Right;  . TOTAL KNEE ARTHROPLASTY Right 03/21/2017  . TOTAL KNEE ARTHROPLASTY Right 03/21/2017   Procedure: RIGHT TOTAL KNEE ARTHROPLASTY;  Surgeon: Joni Fears  W, MD;  Location: [place];  Service: Orthopedics;  Laterality: Right;  . TUBAL LIGATION      There were no vitals filed for this visit.  Subjective Assessment - 04/30/19 1344    Subjective  Pt reports 4/10 soreness from her HEP strength exercises, but no pain. Good compliance with HEP and stretching.    Pertinent History  Pt is a 69 year old female s/p L reverse total shoulder 12/11/18. Reports she was immobilized in her sling for 2 weeks. After immobilization has been using passive motion machine for flexion. Has been doing AAROM flexion/abd/ER, bicep, shrugs, and forward punches with 1# with HHPT over the past week. MD Marlou Sa advised patient for out of sling, AAROM, PROM with no lifting of more than 5# until next followup  with him (mid Oct). Pt reports only 2/10 at rest, 6/10 with motion. Pain is at the medial upper arm and in the armpit. Pain is worse during the evening after moving it all day. Patient lives with her husband and her 4 dogs, which she walks frequently and cares for. She is R handed.Pt denies N/V, B&B changes, unexplained weight fluctuation, saddle paresthesia, fever, night sweats, or unrelenting night pain at this time.Pt denies N/V, B&B changes, unexplained weight fluctuation, saddle paresthesia, fever, night sweats, or unrelenting night pain at this time.    Limitations  House hold activities;Lifting    How long can you sit comfortably?  unlimited    How long can you stand comfortably?  unlimited    How long can you walk comfortably?  unlimited    Diagnostic tests  Xrays    Patient Stated Goals  Being able to complete daily tasks        THEREX -UBE L1resistance68mn forward;237m backward -Supine ABD snowangel 3x8 with good carrover for scapular motion  -Plantigrade alternating shoulder reach outs on fully raised mat table 2x6 with cueing for pt to keep hips down and scapula retracted -Seated military press on mat table with 1# DB 3x6,8, 8 in each hand; cueing for scapular retraction with good carryover -ER step outs with green theraband 2 step outs x2 tiny steps with cueing to keep elbow close to body while forearm straight ahead with good carryover -Standing wall stability with pink ball 3x12 circles in each direction with cueing for motor control to keep shoulder retracted -OH stretch with PVC pipe and 4# weight for long duration stretch -Cross body stretch 3x30 sec  -ER/IR stretch in supine with PT assisting in gentle PROM stretch x15 sec ea direction                         PT Education - 04/30/19 1432    Education Details  therex form, scapular retraction    Person(s) Educated  Patient    Methods  Explanation;Demonstration;Tactile cues    Comprehension   Verbalized understanding       PT Short Term Goals - 02/13/19 1308      PT SHORT TERM GOAL #1   Title  Pt will be independent with HEP in order to improve strength and balance in order to improve function at home    Baseline  01/02/19    Time  4    Period  Weeks    Status  Achieved        PT Long Term Goals - 04/01/19 1519      PT LONG TERM GOAL #1   Title  Pt will decrease quick  DASH score by at least 8% in order to demonstrate clinically significant reduction in disability.    Baseline  02/25/19 56.8%    Time  8    Period  Weeks    Status  Achieved      PT LONG TERM GOAL #2   Title  Patient will demonstrate full AROM in L shoulder in order to complete household ADLs    Baseline  eval 74d Flexion, 58* ABDCT: 02/13/19: LUE shoulder fleixon 126*, abduction 95*; 02/25/19 120* Flexion, 95* ABDCT, IR  L PSIS, ER C7; 04/01/19 flex 121d abd 110d IR L PSIS ER C8    Time  8    Period  Weeks    Status  On-going      PT LONG TERM GOAL #3   Title  Pt will decrease worst pain as reported on NPRS by at least 3 points in order to demonstrate clinically significant reduction in pain.    Baseline  02/25/19 2/10 pain with movement; 04/01/19 3/10 with overhead motion    Time  8    Period  Weeks    Status  Achieved      PT LONG TERM GOAL #4   Title  Pt will demonstrate gross bilat shoulder and periscapular strength of 4/10 in order to safely lift for heavy household chores    Baseline  04/01/19 R/L  shoulder flex: 4+/4 abd: 4+/4+ IR: 4-/4 ER: 3+/4 ext: Periscap Y 3+/3+ T 4/4  I 4+/4 02/25/19 R/L  shoulder flex: 4+/4 abd: 4+/4+ IR: 3+/4 ER: 3+/4 ext: Periscap Y 3+/3- T 4/4-  I 4+/4- ; 01/02/19 see eval    Time  8    Period  Weeks    Status  On-going      PT LONG TERM GOAL #5   Title  Patient will be able to carry 30lb backpack 279f in order to demonstrate safety with taking her hunting equipment into the woods    Baseline  04/01/19 10# DB farmers carry with fatigue at 1031fand some  increased pain    Time  6    Period  Weeks    Status  New            Plan - 04/30/19 1432    Clinical Impression Statement  PT progressed therex strength exercises and added dynamic mobility with OH motions. Pt responded well to treatment and shows good complience with motor control corrections. PT will continue progressing as necessary.    Personal Factors and Comorbidities  Age;Sex;Behavior Pattern;Comorbidity 1;Fitness    Comorbidities  HTN    Examination-Activity Limitations  Dressing;Carry;Reach Overhead;Lift    Examination-Participation Restrictions  Driving;Community Activity;Cleaning    Stability/Clinical Decision Making  Evolving/Moderate complexity    Clinical Decision Making  Moderate    Rehab Potential  Good    PT Frequency  2x / week    PT Duration  8 weeks    PT Treatment/Interventions  ADLs/Self Care Home Management;Electrical Stimulation;Therapeutic activities;Passive range of motion;Manual techniques;Patient/family education;Therapeutic exercise;Iontophoresis 16m21ml Dexamethasone;Moist Heat;Traction;Cryotherapy;Ultrasound;Functional mobility training;Neuromuscular re-education;Balance training;Dry needling;Joint Manipulations    PT Next Visit Plan  Continue to gently progress mobility and strength    PT Home Exercise Plan  [id number]    Consulted and Agree with Plan of Care  Patient       Patient will benefit from skilled therapeutic intervention in order to improve the following deficits and impairments:  Decreased mobility, Decreased endurance, Pain, Postural dysfunction, Impaired UE functional use, Impaired flexibility, Increased fascial restricitons, Decreased  strength, Decreased coordination, Decreased range of motion, Improper body mechanics  Visit Diagnosis: Stiffness of left shoulder, not elsewhere classified  Acute pain of left shoulder  Muscle weakness (generalized)     Problem List Patient Active Problem List   Diagnosis Date Noted  . OA  (osteoarthritis) of shoulder 12/11/2018  . Chronic left shoulder pain 08/23/2018  . Chronic diarrhea 12/07/2014  . Segmental colitis with rectal bleeding ([place]) 09/12/2014  . Routine general medical examination at a health care facility 06/05/2014  . Overweight 06/13/2011  . ABNORMAL CHEST XRAY 09/30/2007  . Hyperlipidemia 02/17/2007  . Essential hypertension 01/08/2007  . Osteoarthritis 01/08/2007   Shelton Silvas PT, DPT Ivin Booty, SPT Shelton Silvas 04/30/2019, 3:23 PM  [facility] [address], [place], [address] Phone: [phone]   Fax:  [phone]  Name: [name] MRN: [id number] Date of Birth: June 13, 1950

## 2019-05-01 ENCOUNTER — Encounter: Payer: Self-pay | Admitting: Orthopedic Surgery

## 2019-05-01 NOTE — Progress Notes (Signed)
Office Visit Note   Patient: [name]           Date of Birth: 07/24/1950           MRN: [id number] Visit Date: 04/29/2019 Requested by: Hoyt Koch, MD [address] [place],  [place] [address] PCP: Hoyt Koch, MD  Subjective: Chief Complaint  Patient presents with  . Follow-up    HPI: Kanyia is a patient with bilateral hand numbness right worse than left.  Since have seen her she is had an EMG nerve study which shows moderate to severe bilateral carpal tunnel syndrome.  She is using braces at night.  Daytime she is okay but she does report nighttime her symptoms are worse.  She had shoulder placement September 1 and is doing well with that on the left-hand side.  Getting neck injections for that problem.  Does report a little bit of loss of dexterity in the hands as well as some occasional weakness.              ROS: All systems reviewed are negative as they relate to the chief complaint within the history of present illness.  Patient denies  fevers or chills.   Assessment & Plan: Visit Diagnoses:  1. Bilateral carpal tunnel syndrome     Plan: Impression is bilateral carpal tunnel syndrome with just the slightest amount of abductor pollicis brevis flattening on the left-hand side compared to the right.  She is right-hand dominant.  Both sides by nerve study are about the same.  Discussed with her operative and nonoperative treatment options for the carpal tunnel syndrome.  Risk and benefits include not limited to infection nerve vessel damage diminished strength and potential some loss of function are all discussed.  Patient would like to proceed with left carpal tunnel release which I think would be reasonable based on her symptoms.  All questions answered.  Follow-Up Instructions: No follow-ups on file.   Orders:  No orders of the defined types were placed in this encounter.  No orders of the defined types were placed in this encounter.     Procedures: No procedures performed   Clinical Data: No additional findings.  Objective: Vital Signs: LMP 02/09/2005   Physical Exam:   Constitutional: Patient appears well-developed HEENT:  Head: Normocephalic Eyes:EOM are normal Neck: Normal range of motion Cardiovascular: Normal rate Pulmonary/chest: Effort normal Neurologic: Patient is alert Skin: Skin is warm Psychiatric: Patient has normal mood and affect    Ortho Exam: Ortho exam demonstrates slight abductor pollicis brevis wasting on the left compared to right but her grip strength is about 5- out of 5 bilaterally.  Wrist range of motion is intact.  Patient has good EPL FPL function.  Interosseous strength is intact.  Negative Tinel's cubital tunnel at the elbow bilaterally.  Specialty Comments:  No specialty comments available.  Imaging: No results found.   PMFS History: Patient Active Problem List   Diagnosis Date Noted  . 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 Chronic left shoulder pain 08/23/2018  . Chronic diarrhea 12/07/2014  . Segmental colitis with rectal bleeding ([place]) 09/12/2014  . Routine general medical examination at a health care facility 06/05/2014  . Overweight 06/13/2011  . ABNORMAL CHEST XRAY 09/30/2007  . Hyperlipidemia 02/17/2007  . Essential hypertension 01/08/2007  . Osteoarthritis 01/08/2007   Past Medical History:  Diagnosis Date  . Arthritis    back, fingers with joint pain and swelling.  chronic back pain  .  Chronic back pain    arthritis   . Diverticulosis   . Elevated cholesterol    takes Niacin daily and Simvastatin  . GERD (gastroesophageal reflux disease) 06/2004   non-specific gastritis on EGD 06/2004  . Headache(784.0)    occasionally  . History of colon polyps 2004, 2009   2004:adenomatous. 2009 hyperplastic.   Marland Kitchen History of hiatal hernia   . Hypertension    takes Tenoretic and Lisinopril daily  . Mucoid cyst of joint 08/2013   right index finger  .  Osteopenia 01/2014   T score -1.1 FRAX 14%/0.5%. Stable from prior DEXA  . PONV (postoperative nausea and vomiting)   . Rotator cuff arthropathy    Left  . Seasonal allergies   . Stroke ([place]) 1998   x 2 - mild left-sided weakness  . Urge incontinence   . Uterine prolapse     Family History  Problem Relation Age of Onset  . Hypertension Mother   . Heart disease Mother   . Diabetes Father   . Hypertension Father   . Heart disease Father   . Stroke Father   . Diabetes Maternal Aunt   . Diabetes Paternal Grandmother   . Hypertension Paternal Grandfather   . Stroke Paternal Grandfather     Past Surgical History:  Procedure Laterality Date  . BACK SURGERY     x 2  . BELPHAROPTOSIS REPAIR Bilateral 12/14/2017  . CATARACT EXTRACTION    . CATARACT EXTRACTION Bilateral 10/2017  . COLONOSCOPY  2004, 2009, 2014  . FINGER SURGERY    . GUM SURGERY    . GYNECOLOGIC CRYOSURGERY    . HYSTEROSCOPY WITH D & C  12/07/2010   with resection of endometrial polyp  . JOINT REPLACEMENT    . KNEE ARTHROSCOPY Left 2008  . lip biopsy     done at MD office Fri 11/22/13  . MASS EXCISION Right 08/15/2013   Procedure: RIGHT INDEX EXCISION MASS ;  Surgeon: Tennis Must, MD;  Location: [facility];  Service: Orthopedics;  Laterality: Right;  . NASAL SEPTUM SURGERY    . OOPHORECTOMY Right 2000  . REVERSE SHOULDER ARTHROPLASTY Left 12/11/2018  . REVERSE SHOULDER ARTHROPLASTY Left 12/11/2018   Procedure: LEFT REVERSE SHOULDER ARTHROPLASTY;  Surgeon: Meredith Pel, MD;  Location: [facility];  Service: Orthopedics;  Laterality: Left;  . SHOULDER ARTHROSCOPY WITH OPEN ROTATOR CUFF REPAIR AND DISTAL CLAVICLE ACROMINECTOMY Right 11/26/2013   Procedure: RIGHT SHOULDER ARTHROSCOPY WITH MINI OPEN ROTATOR CUFF REPAIR AND DISTAL CLAVICLE RESECTION, SUBACROMIAL DECOMPRESSION, POSSIBLE [facility] PATCH.;  Surgeon: [name], MD;  Location: [facility];  Service: Orthopedics;  Laterality: Right;  . TOTAL  KNEE ARTHROPLASTY Right 03/21/2017  . TOTAL KNEE ARTHROPLASTY Right 03/21/2017   Procedure: RIGHT TOTAL KNEE ARTHROPLASTY;  Surgeon: [name], MD;  Location: [facility];  Service: Orthopedics;  Laterality: Right;  . TUBAL LIGATION     Social History   Occupational History  . Occupation: Animal nutritionist    Comment: Disability/Retired  Tobacco Use  . Smoking status: Passive Smoke Exposure - Never Smoker  . Smokeless tobacco: Never Used  Substance and Sexual Activity  . Alcohol use: Not Currently    Alcohol/week: 0.0 standard drinks    Comment: about 2-3 times a year  . Drug use: No  . Sexual activity: Yes    Partners: Male    Birth control/protection: Surgical, Post-menopausal    Comment: BTL-1st intercourse 69 yo-More than 5 partners

## 2019-05-02 ENCOUNTER — Encounter: Payer: Medicare Other | Admitting: Physical Therapy

## 2019-05-02 ENCOUNTER — Encounter: Payer: Self-pay | Admitting: Physical Medicine and Rehabilitation

## 2019-05-02 ENCOUNTER — Ambulatory Visit: Payer: Self-pay

## 2019-05-02 ENCOUNTER — Other Ambulatory Visit: Payer: Self-pay

## 2019-05-02 ENCOUNTER — Ambulatory Visit (INDEPENDENT_AMBULATORY_CARE_PROVIDER_SITE_OTHER): Payer: Medicare Other | Admitting: Physical Medicine and Rehabilitation

## 2019-05-02 VITALS — BP 137/88 | HR 96

## 2019-05-02 DIAGNOSIS — M4802 Spinal stenosis, cervical region: Secondary | ICD-10-CM

## 2019-05-02 MED ORDER — METHYLPREDNISOLONE ACETATE 80 MG/ML IJ SUSP
40.0000 mg | Freq: Once | INTRAMUSCULAR | Status: AC
  Administered 2019-05-02: 40 mg

## 2019-05-02 NOTE — Progress Notes (Signed)
Pt states pain neck and sometimes both shoulders. Pt states no major changes since last visit.   .Numeric Pain Rating Scale and Functional Assessment Average Pain 7   In the last MONTH (on 0-10 scale) has pain interfered with the following?  1. General activity like being  able to carry out your everyday physical activities such as walking, climbing stairs, carrying groceries, or moving a chair?  Rating(7)   +Driver, -BT, -Dye Allergies.

## 2019-05-03 ENCOUNTER — Ambulatory Visit: Payer: Medicare Other | Admitting: Physical Therapy

## 2019-05-03 ENCOUNTER — Encounter: Payer: Self-pay | Admitting: Physical Therapy

## 2019-05-03 DIAGNOSIS — M25612 Stiffness of left shoulder, not elsewhere classified: Secondary | ICD-10-CM | POA: Diagnosis not present

## 2019-05-03 DIAGNOSIS — M25572 Pain in left ankle and joints of left foot: Secondary | ICD-10-CM | POA: Diagnosis not present

## 2019-05-03 DIAGNOSIS — M25512 Pain in left shoulder: Secondary | ICD-10-CM | POA: Diagnosis not present

## 2019-05-03 DIAGNOSIS — M6281 Muscle weakness (generalized): Secondary | ICD-10-CM | POA: Diagnosis not present

## 2019-05-03 DIAGNOSIS — R262 Difficulty in walking, not elsewhere classified: Secondary | ICD-10-CM | POA: Diagnosis not present

## 2019-05-03 NOTE — Therapy (Addendum)
[facility] [address] [place], [address] Phone: [phone]   Fax:  [phone]  Physical Therapy Treatment  Patient Details  Name: [name] MRN: [id number] Date of Birth: [birth year] No data recorded  Encounter Date: 05/03/2019  PT End of Session - 05/03/19 1112    Visit Number  36    Number of Visits  39    Date for PT Re-Evaluation  05/13/19    Authorization Type  medicare    Authorization - Visit Number  9    Authorization - Number of Visits  10    PT Start Time  1030    PT Stop Time  1110    PT Time Calculation (min)  40 min    Activity Tolerance  Patient tolerated treatment well;No increased pain    Behavior During Therapy  WFL for tasks assessed/performed       Past Medical History:  Diagnosis Date  . Arthritis    back, fingers with joint pain and swelling.  chronic back pain  . Chronic back pain    arthritis   . Diverticulosis   . Elevated cholesterol    takes Niacin daily and Simvastatin  . GERD (gastroesophageal reflux disease) 06/2004   non-specific gastritis on EGD 06/2004  . Headache(784.0)    occasionally  . History of colon polyps 2004, 2009   2004:adenomatous. 2009 hyperplastic.   Marland Kitchen History of hiatal hernia   . Hypertension    takes Tenoretic and Lisinopril daily  . Mucoid cyst of joint 08/2013   right index finger  . Osteopenia 01/2014   T score -1.1 FRAX 14%/0.5%. Stable from prior DEXA  . PONV (postoperative nausea and vomiting)   . Rotator cuff arthropathy    Left  . Seasonal allergies   . Stroke ([place]) 1998   x 2 - mild left-sided weakness  . Urge incontinence   . Uterine prolapse     Past Surgical History:  Procedure Laterality Date  . BACK SURGERY     x 2  . BELPHAROPTOSIS REPAIR Bilateral 12/14/2017  . CATARACT EXTRACTION    . CATARACT EXTRACTION Bilateral 10/2017  . COLONOSCOPY  2004, 2009, 2014  . FINGER SURGERY    . GUM SURGERY    .  GYNECOLOGIC CRYOSURGERY    . HYSTEROSCOPY WITH D & C  12/07/2010   with resection of endometrial polyp  . JOINT REPLACEMENT    . KNEE ARTHROSCOPY Left 2008  . lip biopsy     done at MD office Fri 11/22/13  . MASS EXCISION Right 08/15/2013   Procedure: RIGHT INDEX EXCISION MASS ;  Surgeon: Tennis Must, MD;  Location: [facility];  Service: Orthopedics;  Laterality: Right;  . NASAL SEPTUM SURGERY    . OOPHORECTOMY Right 2000  . REVERSE SHOULDER ARTHROPLASTY Left 12/11/2018  . REVERSE SHOULDER ARTHROPLASTY Left 12/11/2018   Procedure: LEFT REVERSE SHOULDER ARTHROPLASTY;  Surgeon: Meredith Pel, MD;  Location: [facility];  Service: Orthopedics;  Laterality: Left;  . SHOULDER ARTHROSCOPY WITH OPEN ROTATOR CUFF REPAIR AND DISTAL CLAVICLE ACROMINECTOMY Right 11/26/2013   Procedure: RIGHT SHOULDER ARTHROSCOPY WITH MINI OPEN ROTATOR CUFF REPAIR AND DISTAL CLAVICLE RESECTION, SUBACROMIAL DECOMPRESSION, POSSIBLE [facility] PATCH.;  Surgeon: [name], MD;  Location: [facility];  Service: Orthopedics;  Laterality: Right;  . TOTAL KNEE ARTHROPLASTY Right 03/21/2017  . TOTAL KNEE ARTHROPLASTY Right 03/21/2017   Procedure: RIGHT TOTAL KNEE ARTHROPLASTY;  Surgeon: Joni Fears  W, MD;  Location: [facility];  Service: Orthopedics;  Laterality: Right;  . TUBAL LIGATION      There were no vitals filed for this visit.  Subjective Assessment - 05/03/19 1033    Subjective  Pt had cortizone shot in cervical region which she feels helped reduce her pain today. Good compliance with HEP.    Pertinent History  Pt is a 69 year old female s/p L reverse total shoulder 12/11/18. Reports she was immobilized in her sling for 2 weeks. After immobilization has been using passive motion machine for flexion. Has been doing AAROM flexion/abd/ER, bicep, shrugs, and forward punches with 1# with HHPT over the past week. MD Marlou Sa advised patient for out of sling, AAROM, PROM with no lifting of more than 5# until next  followup with him (mid Oct). Pt reports only 2/10 at rest, 6/10 with motion. Pain is at the medial upper arm and in the armpit. Pain is worse during the evening after moving it all day. Patient lives with her husband and her 4 dogs, which she walks frequently and cares for. She is R handed.Pt denies N/V, B&B changes, unexplained weight fluctuation, saddle paresthesia, fever, night sweats, or unrelenting night pain at this time.Pt denies N/V, B&B changes, unexplained weight fluctuation, saddle paresthesia, fever, night sweats, or unrelenting night pain at this time.    Limitations  House hold activities;Lifting    How long can you sit comfortably?  unlimited    How long can you stand comfortably?  unlimited    How long can you walk comfortably?  unlimited    Diagnostic tests  Xrays    Patient Stated Goals  Being able to complete daily tasks       10:30  THEREX -Arm bike for 2 mins in each direction -Supine shoulder flexion to abd with 2# 3x10  -Seated 90 ABD with scap retraction 2# 1x6 too hard for pt, 1# 2x8 -Diagonal band pull aparts GTB 2x6 ea hand up - with good compliance  -Plantigrade taps on mat table- hurt pts wrist due to carpal tunnel (PT moved to wall push ups)  -Standing wall push ups 2x8 - good carryover with keeping shoulder blades pulled down and back  -Bent over W's retractions 1x10, 1# weight 2x10; cueing to keep trunk flexed just slightly good carryover -Lat pull down machine 20# 2x8                       PT Education - 05/03/19 1111    Education Details  therex form    Person(s) Educated  Patient    Methods  Explanation;Demonstration;Verbal cues    Comprehension  Verbalized understanding       PT Short Term Goals - 02/13/19 1308      PT SHORT TERM GOAL #1   Title  Pt will be independent with HEP in order to improve strength and balance in order to improve function at home    Baseline  01/02/19    Time  4    Period  Weeks    Status  Achieved         PT Long Term Goals - 04/01/19 1519      PT LONG TERM GOAL #1   Title  Pt will decrease quick DASH score by at least 8% in order to demonstrate clinically significant reduction in disability.    Baseline  02/25/19 56.8%    Time  8    Period  Weeks  Status  Achieved      PT LONG TERM GOAL #2   Title  Patient will demonstrate full AROM in L shoulder in order to complete household ADLs    Baseline  eval 74d Flexion, 58* ABDCT: 02/13/19: LUE shoulder fleixon 126*, abduction 95*; 02/25/19 120* Flexion, 95* ABDCT, IR  L PSIS, ER C7; 04/01/19 flex 121d abd 110d IR L PSIS ER C8    Time  8    Period  Weeks    Status  On-going      PT LONG TERM GOAL #3   Title  Pt will decrease worst pain as reported on NPRS by at least 3 points in order to demonstrate clinically significant reduction in pain.    Baseline  02/25/19 2/10 pain with movement; 04/01/19 3/10 with overhead motion    Time  8    Period  Weeks    Status  Achieved      PT LONG TERM GOAL #4   Title  Pt will demonstrate gross bilat shoulder and periscapular strength of 4/10 in order to safely lift for heavy household chores    Baseline  04/01/19 R/L  shoulder flex: 4+/4 abd: 4+/4+ IR: 4-/4 ER: 3+/4 ext: Periscap Y 3+/3+ T 4/4  I 4+/4 02/25/19 R/L  shoulder flex: 4+/4 abd: 4+/4+ IR: 3+/4 ER: 3+/4 ext: Periscap Y 3+/3- T 4/4-  I 4+/4- ; 01/02/19 see eval    Time  8    Period  Weeks    Status  On-going      PT LONG TERM GOAL #5   Title  Patient will be able to carry 30lb backpack 229f in order to demonstrate safety with taking her hunting equipment into the woods    Baseline  04/01/19 10# DB farmers carry with fatigue at 1043fand some increased pain    Time  6    Period  Weeks    Status  New            Plan - 05/03/19 1112    Clinical Impression Statement  PT progressed strength therex with good response from pt. Pt reported less pain due to steroid injection and good compliance with strength training. PT modified  some exercises to relieve pressure on wrist due to carpal tunnel pain. Pt demonstrated excellent form and required minimal cueing from PT. PT will continue with progresses as appropriate.    Personal Factors and Comorbidities  Age;Sex;Behavior Pattern;Comorbidity 1;Fitness    Comorbidities  HTN    Examination-Activity Limitations  Dressing;Carry;Reach Overhead;Lift    Examination-Participation Restrictions  Driving;Community Activity;Cleaning    Stability/Clinical Decision Making  Evolving/Moderate complexity    Clinical Decision Making  Moderate    Rehab Potential  Good    PT Frequency  2x / week    PT Duration  8 weeks    PT Treatment/Interventions  ADLs/Self Care Home Management;Electrical Stimulation;Therapeutic activities;Passive range of motion;Manual techniques;Patient/family education;Therapeutic exercise;Iontophoresis 10m77ml Dexamethasone;Moist Heat;Traction;Cryotherapy;Ultrasound;Functional mobility training;Neuromuscular re-education;Balance training;Dry needling;Joint Manipulations    PT Next Visit Plan  REASSESS NEXT VISIT; Continue to gently progress mobility and strength    PT Home Exercise Plan  [id number]    Consulted and Agree with Plan of Care  Patient       Patient will benefit from skilled therapeutic intervention in order to improve the following deficits and impairments:  Decreased mobility, Decreased endurance, Pain, Postural dysfunction, Impaired UE functional use, Impaired flexibility, Increased fascial restricitons, Decreased strength, Decreased coordination, Decreased range of motion, Improper body mechanics  Visit Diagnosis: Acute pain of left shoulder  Stiffness of left shoulder, not elsewhere classified     Problem List Patient Active Problem List   Diagnosis Date Noted  . 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 Chronic left shoulder pain 08/23/2018  . Chronic diarrhea 12/07/2014  . Segmental colitis with rectal bleeding ([facility]) 09/12/2014  . Routine  general medical examination at a health care facility 06/05/2014  . Overweight 06/13/2011  . ABNORMAL CHEST XRAY 09/30/2007  . Hyperlipidemia 02/17/2007  . Essential hypertension 01/08/2007  . Osteoarthritis 01/08/2007    Shelton Silvas PT, DPT Ivin Booty, SPT Shelton Silvas 05/03/2019, 11:24 AM  [facility] [address], [place], [address] Phone: [phone]   Fax:  [phone]  Name: [name] MRN: [id number] Date of Birth: [birth year]

## 2019-05-07 ENCOUNTER — Other Ambulatory Visit: Payer: Self-pay

## 2019-05-07 ENCOUNTER — Encounter: Payer: Self-pay | Admitting: Physical Therapy

## 2019-05-07 ENCOUNTER — Ambulatory Visit: Payer: Medicare Other | Admitting: Physical Therapy

## 2019-05-07 DIAGNOSIS — M25572 Pain in left ankle and joints of left foot: Secondary | ICD-10-CM | POA: Diagnosis not present

## 2019-05-07 DIAGNOSIS — M25612 Stiffness of left shoulder, not elsewhere classified: Secondary | ICD-10-CM | POA: Diagnosis not present

## 2019-05-07 DIAGNOSIS — M6281 Muscle weakness (generalized): Secondary | ICD-10-CM | POA: Diagnosis not present

## 2019-05-07 DIAGNOSIS — M25512 Pain in left shoulder: Secondary | ICD-10-CM | POA: Diagnosis not present

## 2019-05-07 DIAGNOSIS — R262 Difficulty in walking, not elsewhere classified: Secondary | ICD-10-CM | POA: Diagnosis not present

## 2019-05-07 NOTE — Therapy (Addendum)
[facility] [address], [place], [address] Phone: [phone]   Fax:  [phone]  Physical Therapy Treatment/Progress Note Reporting Period 04/07/20 - 05/07/19  Patient Details  Name: [name] MRN: [id number] Date of Birth: 25-Dec-1950 No data recorded  Encounter Date: 05/07/2019    Past Medical History:  Diagnosis Date  . Arthritis    back, fingers with joint pain and swelling.  chronic back pain  . Chronic back pain    arthritis   . Diverticulosis   . Elevated cholesterol    takes Niacin daily and Simvastatin  . GERD (gastroesophageal reflux disease) 06/2004   non-specific gastritis on EGD 06/2004  . Headache(784.0)    occasionally  . History of colon polyps 2004, 2009   2004:adenomatous. 2009 hyperplastic.   Marland Kitchen History of hiatal hernia   . Hypertension    takes Tenoretic and Lisinopril daily  . Mucoid cyst of joint 08/2013   right index finger  . Osteopenia 01/2014   T score -1.1 FRAX 14%/0.5%. Stable from prior DEXA  . PONV (postoperative nausea and vomiting)   . Rotator cuff arthropathy    Left  . Seasonal allergies   . Stroke ([place]) 1998   x 2 - mild left-sided weakness  . Urge incontinence   . Uterine prolapse     Past Surgical History:  Procedure Laterality Date  . BACK SURGERY     x 2  . BELPHAROPTOSIS REPAIR Bilateral 12/14/2017  . CATARACT EXTRACTION    . CATARACT EXTRACTION Bilateral 10/2017  . COLONOSCOPY  2004, 2009, 2014  . FINGER SURGERY    . GUM SURGERY    . GYNECOLOGIC CRYOSURGERY    . HYSTEROSCOPY WITH D & C  12/07/2010   with resection of endometrial polyp  . JOINT REPLACEMENT    . KNEE ARTHROSCOPY Left 2008  . lip biopsy     done at MD office Fri 11/22/13  . MASS EXCISION Right 08/15/2013   Procedure: RIGHT INDEX EXCISION MASS ;  Surgeon: Tennis Must, MD;  Location: [facility];  Service: Orthopedics;  Laterality: Right;  . NASAL SEPTUM  SURGERY    . OOPHORECTOMY Right 2000  . REVERSE SHOULDER ARTHROPLASTY Left 12/11/2018  . REVERSE SHOULDER ARTHROPLASTY Left 12/11/2018   Procedure: LEFT REVERSE SHOULDER ARTHROPLASTY;  Surgeon: Meredith Pel, MD;  Location: [facility];  Service: Orthopedics;  Laterality: Left;  . SHOULDER ARTHROSCOPY WITH OPEN ROTATOR CUFF REPAIR AND DISTAL CLAVICLE ACROMINECTOMY Right 11/26/2013   Procedure: RIGHT SHOULDER ARTHROSCOPY WITH MINI OPEN ROTATOR CUFF REPAIR AND DISTAL CLAVICLE RESECTION, SUBACROMIAL DECOMPRESSION, POSSIBLE [facility] PATCH.;  Surgeon: [name], MD;  Location: [facility];  Service: Orthopedics;  Laterality: Right;  . TOTAL KNEE ARTHROPLASTY Right 03/21/2017  . TOTAL KNEE ARTHROPLASTY Right 03/21/2017   Procedure: RIGHT TOTAL KNEE ARTHROPLASTY;  Surgeon: [name], MD;  Location: [facility];  Service: Orthopedics;  Laterality: Right;  . TUBAL LIGATION      There were no vitals filed for this visit.    THEREX UBE 78mn forward 243m backward AROM and MMT assessment, cuing for posture for true AROM assessment with good carry over Farmers carry 10037f0# UE at side; 100f57fth 10# DB at shoulder with good carry over of postural cuing 10# DB squat lift; following lift with 20ft22fry to mimic carrying dog/dog food into house with fatigue following PT updated HEP to consolidate therex to more manageable routine for patient.  PT expressed the importance of compliance, educated patient on frequency, rep/set range, intensity and hold time for strengthening vs. Stretching therex. Patient able to demonstrate a set of the following with min correction needed:  Sleeper Stretch - 1-3mn hold - 3x daily - 7x weekly  Doorway Pec Stretch at 60 Degrees Abduction with Arm Straight - 1-285m hold - 3x daily - 7x weekly  Standing Shoulder Flexion Stretch on Wall - 1-53m47mhold - 3x daily - 7x weekly  Seated Overhead Press with Dumbbells - 5-10 reps - 3 sets - 1x daily - 2x weekly  Standing Row with  Anchored Resistance - 10 reps - 3 sets - 1x daily - 2x weekly  Scaption with Dumbbells - 10 reps - 3 sets - 1x daily - 2x weekly    PT educated pt about goal progress and where improvements should be made; pt wishes to continue with PT in order to achieve goals that were not fully met today                           PT Short Term Goals - 06/05/19 1350      PT SHORT TERM GOAL #1   Title  Pt will be independent with HEP in order to improve strength and balance in order to improve function at home    Baseline  01/02/19; 05/07/2019 does them "most of the time"; 06/05/19 completing regularly    Time  4    Period  Weeks    Status  Partially Met        PT Long Term Goals - 06/05/19 1351      PT LONG TERM GOAL #1   Title  Pt will decrease quick DASH score by at least 8% in order to demonstrate clinically significant reduction in disability.    Baseline  02/25/19 56.8%; 05/07/2019 40.9%    Time  8    Period  Weeks    Status  Achieved      PT LONG TERM GOAL #2   Title  Patient will demonstrate full AROM in L shoulder in order to complete household ADLs    Baseline  05/07/2019; flexion:130 ABD 115d IR S2 ER C8; 04/01/19 flex 121d abd 110d IR L PSIS ER C8; 06/05/19 flex 136d abd 118d IR L2 ER C8    Time  8    Period  Weeks    Status  On-going      PT LONG TERM GOAL #3   Title  Pt will decrease worst pain as reported on NPRS by at least 3 points in order to demonstrate clinically significant reduction in pain.    Baseline  02/25/19 2/10 pain with movement; 04/01/19 3/10 with overhead motion; 05/07/2019 4/10 with ADLs; 06/05/19 No pain just soreness    Time  8    Period  Weeks    Status  Achieved      PT LONG TERM GOAL #4   Title  Pt will demonstrate gross periscapular strength of 4/5 in order to safely lift for heavy household chores    Baseline  05/07/2019; R/L shoulder flex 4+/4; ABD* 4/4; ER 5/5; IR 5/4; Pariscap Y: 3/3-gradual release from test position; T:  4/4-, I 5/4; 06/05/19 R/L shoulder flex 5/5; ABD 4+/4+; ER 5/4+; IR 5/4; Pariscap Y: 3+/3-gradual release from test position; T: 4+/4+-, I 5/4+ Latissimus    Time  8    Period  Weeks    Status  Partially  Met      PT LONG TERM GOAL #5   Title  Patient will be able to carry 20lb backpack 25f in order to demonstrate safety with taking her hunting equipment into the woods    Baseline  04/01/19 10# DB farmers carry with fatigue at 1048fand some increased pain; 05/07/2019 10# DB carry [id number] carry 100 ft at L shoulder) with some fatigue but not increase in pain; Able to complete 100ft80fh 35# and 100ft 18f 20#    Time  6    Period  Weeks    Status  Achieved      PT LONG TERM GOAL #6   Title  Pt will be able to bend over and pick up 15# DB and carry 20 feet in order to be able to carry dogs outside and dog food into house    Baseline  05/07/2019; able to pick up 10# DB off the floor and walk 20 feet with medium difficulty and fatigue; 06/05/19 20# carry 20ft w26ffatigue, and moderate difficulty    Time  6    Period  Weeks    Status  On-going              Patient will benefit from skilled therapeutic intervention in order to improve the following deficits and impairments:  Decreased mobility, Decreased endurance, Pain, Postural dysfunction, Impaired UE functional use, Impaired flexibility, Increased fascial restricitons, Decreased strength, Decreased coordination, Decreased range of motion, Improper body mechanics  Visit Diagnosis: Stiffness of left shoulder, not elsewhere classified - Plan: PT plan of care cert/re-cert  Muscle weakness (generalized) - Plan: PT plan of care cert/re-cert  Acute pain of left shoulder - Plan: PT plan of care cert/re-cert     Problem List Patient Active Problem List   Diagnosis Date Noted  . 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 Chronic left shoulder pain 08/23/2018  . Chronic diarrhea 12/07/2014  . Segmental colitis with  rectal bleeding ([facility]) [place]/2016  . Routine general medical examination at a health care facility 06/05/2014  . Overweight 06/13/2011  . ABNORMAL CHEST XRAY 09/30/2007  . Hyperlipidemia 02/17/2007  . Essential hypertension 01/08/2007  . Osteoarthritis 01/08/2007   ChelseaShelton SilvasT ChelseaShelton Silvas021, 2:51 PM  [facility] [address] [address] [phone]  [phone] Yoali BSYRINA [id number] Birth: [birth year]

## 2019-05-09 ENCOUNTER — Encounter: Payer: Self-pay | Admitting: Physical Therapy

## 2019-05-09 ENCOUNTER — Ambulatory Visit: Payer: Medicare Other | Admitting: Physical Therapy

## 2019-05-09 ENCOUNTER — Other Ambulatory Visit: Payer: Self-pay

## 2019-05-09 DIAGNOSIS — M25612 Stiffness of left shoulder, not elsewhere classified: Secondary | ICD-10-CM

## 2019-05-09 DIAGNOSIS — M25512 Pain in left shoulder: Secondary | ICD-10-CM | POA: Diagnosis not present

## 2019-05-09 DIAGNOSIS — M6281 Muscle weakness (generalized): Secondary | ICD-10-CM

## 2019-05-09 DIAGNOSIS — M25572 Pain in left ankle and joints of left foot: Secondary | ICD-10-CM | POA: Diagnosis not present

## 2019-05-09 DIAGNOSIS — R262 Difficulty in walking, not elsewhere classified: Secondary | ICD-10-CM | POA: Diagnosis not present

## 2019-05-09 NOTE — Therapy (Signed)
[facility] [address], [place], [address] Phone: [phone]   Fax:  [phone]  Physical Therapy Treatment  Patient Details  Name: BRONNIE VASSEUR MRN: [id number] Date of Birth: 1951-01-02 No data recorded  Encounter Date: 05/09/2019  PT End of Session - 05/09/19 1353    Visit Number  38    Number of Visits  23    Date for PT Re-Evaluation  06/17/19    Authorization Type  medicare    Authorization - Visit Number  1    Authorization - Number of Visits  10    PT Start Time  0145    PT Stop Time  0230    PT Time Calculation (min)  45 min    Activity Tolerance  Patient tolerated treatment well    Behavior During Therapy  [facility] for tasks assessed/performed       Past Medical History:  Diagnosis Date  . Arthritis    back, fingers with joint pain and swelling.  chronic back pain  . Chronic back pain    arthritis   . Diverticulosis   . Elevated cholesterol    takes Niacin daily and Simvastatin  . GERD (gastroesophageal reflux disease) 06/2004   non-specific gastritis on EGD 06/2004  . Headache(784.0)    occasionally  . History of colon polyps 2004, 2009   2004:adenomatous. 2009 hyperplastic.   Marland Kitchen History of hiatal hernia   . Hypertension    takes Tenoretic and Lisinopril daily  . Mucoid cyst of joint 08/2013   right index finger  . Osteopenia 01/2014   T score -1.1 FRAX 14%/0.5%. Stable from prior DEXA  . PONV (postoperative nausea and vomiting)   . Rotator cuff arthropathy    Left  . Seasonal allergies   . Stroke ([place]) 1998   x 2 - mild left-sided weakness  . Urge incontinence   . Uterine prolapse     Past Surgical History:  Procedure Laterality Date  . BACK SURGERY     x 2  . BELPHAROPTOSIS REPAIR Bilateral 12/14/2017  . CATARACT EXTRACTION    . CATARACT EXTRACTION Bilateral 10/2017  . COLONOSCOPY  2004, 2009, 2014  . FINGER SURGERY    . GUM SURGERY    . GYNECOLOGIC CRYOSURGERY     . HYSTEROSCOPY WITH D & C  12/07/2010   with resection of endometrial polyp  . JOINT REPLACEMENT    . KNEE ARTHROSCOPY Left 2008  . lip biopsy     done at MD office Fri 11/22/13  . MASS EXCISION Right 08/15/2013   Procedure: RIGHT INDEX EXCISION MASS ;  Surgeon: Tennis Must, MD;  Location: [place];  Service: Orthopedics;  Laterality: Right;  . NASAL SEPTUM SURGERY    . OOPHORECTOMY Right 2000  . REVERSE SHOULDER ARTHROPLASTY Left 12/11/2018  . REVERSE SHOULDER ARTHROPLASTY Left 12/11/2018   Procedure: LEFT REVERSE SHOULDER ARTHROPLASTY;  Surgeon: Meredith Pel, MD;  Location: [place];  Service: Orthopedics;  Laterality: Left;  . SHOULDER ARTHROSCOPY WITH OPEN ROTATOR CUFF REPAIR AND DISTAL CLAVICLE ACROMINECTOMY Right 11/26/2013   Procedure: RIGHT SHOULDER ARTHROSCOPY WITH MINI OPEN ROTATOR CUFF REPAIR AND DISTAL CLAVICLE RESECTION, SUBACROMIAL DECOMPRESSION, POSSIBLE [facility] PATCH.;  Surgeon: [name], MD;  Location: [place];  Service: Orthopedics;  Laterality: Right;  . TOTAL KNEE ARTHROPLASTY Right 03/21/2017  . TOTAL KNEE ARTHROPLASTY Right 03/21/2017   Procedure: RIGHT TOTAL KNEE ARTHROPLASTY;  Surgeon: [name], MD;  Location: [place];  Service: Orthopedics;  Laterality: Right;  . TUBAL LIGATION      There were no vitals filed for this visit.  Subjective Assessment - 05/09/19 1349    Subjective  Pt reports she is a little [place] today, no other symptoms, thinks it may be something she ate. Reports no shoulder pain today, which she is happy with. HEP compliance with no questions or concerns.    Pertinent History  Pt is a 69 year old female s/p L reverse total shoulder 12/11/18. Reports she was immobilized in her sling for 2 weeks. After immobilization has been using passive motion machine for flexion. Has been doing AAROM flexion/abd/ER, bicep, shrugs, and forward punches with 1# with HHPT over the past week. MD Marlou Sa advised patient for out of sling,  AAROM, PROM with no lifting of more than 5# until next followup with him (mid Oct). Pt reports only 2/10 at rest, 6/10 with motion. Pain is at the medial upper arm and in the armpit. Pain is worse during the evening after moving it all day. Patient lives with her husband and her 4 dogs, which she walks frequently and cares for. She is R handed.Pt denies N/V, B&B changes, unexplained weight fluctuation, saddle paresthesia, fever, night sweats, or unrelenting night pain at this time.Pt denies N/V, B&B changes, unexplained weight fluctuation, saddle paresthesia, fever, night sweats, or unrelenting night pain at this time.    Limitations  House hold activities;Lifting    How long can you sit comfortably?  unlimited    How long can you stand comfortably?  unlimited    How long can you walk comfortably?  unlimited    Diagnostic tests  Xrays    Patient Stated Goals  Being able to complete daily tasks    Pain Onset  1 to 4 weeks ago       THEREX -Arm bike for 2 mins in each direction - Upright row 10# 3x 10 with demo and cuing initally for technique with good carry over, patient with some decreased height in final reps, able to correct with cuing - Bent over row 10# 3x 10 with demo and cuing initially to maintain scapular retraction preventing shoulder rounding and thoracic kyphosis with good carry over following - Seated chest press 10# 3x 10 with cuing to maintain scapular retraction with good carry over - Farmers carry 83f x2 5# DB bilat; 272fx2 6# DB  with cuing initially for posture with good carry over, min TC needed on occasion  With 6# decreased L shoulder height evident - Shrugs 6# 2x 10  - Bilat shoulder ext with scapular retraction + depression 3x 10 with min cuing initiall for posture with good carry over following - Ball pas L to R hand behind back 2x 10 passes to each hand with some discomfort that subsides following exercise                          PT Education -  05/09/19 1353    Education Details  therex form/technique    Person(s) Educated  Patient    Methods  Explanation;Demonstration;Tactile cues;Verbal cues    Comprehension  Verbalized understanding;Returned demonstration;Verbal cues required;Tactile cues required       PT Short Term Goals - 05/07/19 1349      PT SHORT TERM GOAL #1   Title  Pt will be independent with HEP in order to improve strength and balance in order to improve function  at home    Baseline  01/02/19; 05/07/2019 does them "most of the time"    Time  4    Period  Weeks    Status  Partially Met        PT Long Term Goals - 05/07/19 1350      PT LONG TERM GOAL #1   Title  Pt will decrease quick DASH score by at least 8% in order to demonstrate clinically significant reduction in disability.    Baseline  02/25/19 56.8%; 05/07/2019 40.9%    Time  8    Period  Weeks    Status  Achieved      PT LONG TERM GOAL #2   Title  Patient will demonstrate full AROM in L shoulder in order to complete household ADLs    Baseline  05/07/2019; flexion:130 ABD 115d IR S2 ER C8; 04/01/19 flex 121d abd 110d IR L PSIS ER C8    Time  8    Period  Weeks    Status  On-going      PT LONG TERM GOAL #3   Title  Pt will decrease worst pain as reported on NPRS by at least 3 points in order to demonstrate clinically significant reduction in pain.    Baseline  02/25/19 2/10 pain with movement; 04/01/19 3/10 with overhead motion; 05/07/2019 4/10 with ADLs    Time  8    Period  Weeks    Status  On-going      PT LONG TERM GOAL #4   Title  Pt will demonstrate gross periscapular strength of 4/5 in order to safely lift for heavy household chores    Baseline  05/07/2019; R/L shoulder flex 4+/4; ABD* 4/4; ER 5/5; IR 5/4; Pariscap Y: 3/3-gradual release from test position; T: 4/4-, I 5/4    Time  8    Period  Weeks    Status  Revised      PT LONG TERM GOAL #5   Title  Patient will be able to carry 30lb backpack 232f in order to demonstrate  safety with taking her hunting equipment into the woods    Baseline  04/01/19 10# DB farmers carry with fatigue at 1070fand some increased pain; 05/07/2019 10# DB carry [id number] carry 100 ft at L shoulder) with some fatigue but not increase in pain    Time  6    Period  Weeks    Status  On-going      Additional Long Term Goals   Additional Long Term Goals  Yes      PT LONG TERM GOAL #6   Title  Pt will be able to bend over and pick up 15# DB and carry 20 feet in order to be able to carry dogs outside and dog food into house    Baseline  05/07/2019; able to pick up 10# DB off the floor and walk 20 feet with medium difficulty and fatigue    Time  6    Period  Weeks    Status  New            Plan - 05/09/19 1401    Clinical Impression Statement  PT continued therex progression for strengthening toward new goals with good success. Patient motivated throughout session and able to complete all progressions with cuing needed, with planned rest breaks. PT will continue progression as able.    Personal Factors and Comorbidities  Age;Sex;Behavior Pattern;Comorbidity 1;Fitness    Comorbidities  HTN  Examination-Activity Limitations  Dressing;Carry;Reach Overhead;Lift    Examination-Participation Restrictions  Driving;Community Activity;Cleaning    Stability/Clinical Decision Making  Evolving/Moderate complexity    Clinical Decision Making  Moderate    Rehab Potential  Good    PT Frequency  2x / week    PT Duration  8 weeks    PT Treatment/Interventions  ADLs/Self Care Home Management;Electrical Stimulation;Therapeutic activities;Passive range of motion;Manual techniques;Patient/family education;Therapeutic exercise;Iontophoresis 32m/ml Dexamethasone;Moist Heat;Traction;Cryotherapy;Ultrasound;Functional mobility training;Neuromuscular re-education;Balance training;Dry needling;Joint Manipulations    PT Next Visit Plan  progress strength therex for goals    PT Home Exercise Plan   [id number]   Consulted and Agree with Plan of Care  Patient       Patient will benefit from skilled therapeutic intervention in order to improve the following deficits and impairments:  Decreased mobility, Decreased endurance, Pain, Postural dysfunction, Impaired UE functional use, Impaired flexibility, Increased fascial restricitons, Decreased strength, Decreased coordination, Decreased range of motion, Improper body mechanics  Visit Diagnosis: Stiffness of left shoulder, not elsewhere classified  Muscle weakness (generalized)  Acute pain of left shoulder     Problem List Patient Active Problem List   Diagnosis Date Noted  . 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 Chronic left shoulder pain 08/23/2018  . Chronic diarrhea 12/07/2014  . Segmental [place] 09/12/2014  . Routine general medical examination at a health care facility 06/05/2014  . Overweight 06/13/2011  . ABNORMAL CHEST XRAY 09/30/2007  . Hyperlipidemia 02/17/2007  . Essential hypertension 01/08/2007  . Osteoarthritis 01/08/2007   CShelton SilvasPT, DPT CShelton Silvas1/28/2021, 2:24 PM  [facility] [address] [place] [address]Phone: [phone]  Fax:  [phone] Name: NTEDRA COPPERNOLLMRN: [id number] of Birth: [birth year]

## 2019-05-14 ENCOUNTER — Encounter: Payer: Self-pay | Admitting: Physical Therapy

## 2019-05-14 ENCOUNTER — Other Ambulatory Visit: Payer: Self-pay

## 2019-05-14 ENCOUNTER — Ambulatory Visit: Payer: Medicare Other | Attending: Orthopedic Surgery | Admitting: Physical Therapy

## 2019-05-14 DIAGNOSIS — M25612 Stiffness of left shoulder, not elsewhere classified: Secondary | ICD-10-CM

## 2019-05-14 DIAGNOSIS — M6281 Muscle weakness (generalized): Secondary | ICD-10-CM | POA: Insufficient documentation

## 2019-05-14 DIAGNOSIS — M25512 Pain in left shoulder: Secondary | ICD-10-CM | POA: Diagnosis not present

## 2019-05-14 NOTE — Therapy (Signed)
[facility] [address] [place], [address] Phone: [phone]   Fax:  [phone]  Physical Therapy Treatment  Patient Details  Name: [name] MRN: [id number] Date of Birth: Apr 08, 1951 No data recorded  Encounter Date: 05/14/2019  PT End of Session - 05/14/19 1037    Visit Number  39    Number of Visits  38    Date for PT Re-Evaluation  06/17/19    Authorization Type  medicare    Authorization - Visit Number  2    Authorization - Number of Visits  10    PT Start Time  1030    PT Stop Time  1110    PT Time Calculation (min)  40 min    Activity Tolerance  Patient tolerated treatment well    Behavior During Therapy  [facility] for tasks assessed/performed       Past Medical History:  Diagnosis Date  . Arthritis    back, fingers with joint pain and swelling.  chronic back pain  . Chronic back pain    arthritis   . Diverticulosis   . Elevated cholesterol    takes Niacin daily and Simvastatin  . GERD (gastroesophageal reflux disease) 06/2004   non-specific gastritis on EGD 06/2004  . Headache(784.0)    occasionally  . History of colon polyps 2004, 2009   2004:adenomatous. 2009 hyperplastic.   Marland Kitchen History of hiatal hernia   . Hypertension    takes Tenoretic and Lisinopril daily  . Mucoid cyst of joint 08/2013   right index finger  . Osteopenia 01/2014   T score -1.1 FRAX 14%/0.5%. Stable from prior DEXA  . PONV (postoperative nausea and vomiting)   . Rotator cuff arthropathy    Left  . Seasonal allergies   . Stroke ([place]) 1998   x 2 - mild left-sided weakness  . Urge incontinence   . Uterine prolapse     Past Surgical History:  Procedure Laterality Date  . BACK SURGERY     x 2  . BELPHAROPTOSIS REPAIR Bilateral 12/14/2017  . CATARACT EXTRACTION    . CATARACT EXTRACTION Bilateral 10/2017  . COLONOSCOPY  2004, 2009, 2014  . FINGER SURGERY    . GUM SURGERY    . GYNECOLOGIC CRYOSURGERY    .  HYSTEROSCOPY WITH D & C  12/07/2010   with resection of endometrial polyp  . JOINT REPLACEMENT    . KNEE ARTHROSCOPY Left 2008  . lip biopsy     done at MD office Fri 11/22/13  . MASS EXCISION Right 08/15/2013   Procedure: RIGHT INDEX EXCISION MASS ;  Surgeon: Tennis Must, MD;  Location: [place];  Service: Orthopedics;  Laterality: Right;  . NASAL SEPTUM SURGERY    . OOPHORECTOMY Right 2000  . REVERSE SHOULDER ARTHROPLASTY Left 12/11/2018  . REVERSE SHOULDER ARTHROPLASTY Left 12/11/2018   Procedure: LEFT REVERSE SHOULDER ARTHROPLASTY;  Surgeon: Meredith Pel, MD;  Location: [place];  Service: Orthopedics;  Laterality: Left;  . SHOULDER ARTHROSCOPY WITH OPEN ROTATOR CUFF REPAIR AND DISTAL CLAVICLE ACROMINECTOMY Right 11/26/2013   Procedure: RIGHT SHOULDER ARTHROSCOPY WITH MINI OPEN ROTATOR CUFF REPAIR AND DISTAL CLAVICLE RESECTION, SUBACROMIAL DECOMPRESSION, POSSIBLE [facility] PATCH.;  Surgeon: [name], MD;  Location: [place];  Service: Orthopedics;  Laterality: Right;  . TOTAL KNEE ARTHROPLASTY Right 03/21/2017  . TOTAL KNEE ARTHROPLASTY Right 03/21/2017   Procedure: RIGHT TOTAL KNEE ARTHROPLASTY;  Surgeon: [name], MD;  Location: [place];  Service: Orthopedics;  Laterality: Right;  . TUBAL LIGATION      There were no vitals filed for this visit.  Subjective Assessment - 05/14/19 1031    Subjective  Patinet reports her shoulder is feeling better overall, reports only 1/10 pain this morning. HEP is going well.    Pertinent History  Pt is a 69 year old female s/p L reverse total shoulder 12/11/18. Reports she was immobilized in her sling for 2 weeks. After immobilization has been using passive motion machine for flexion. Has been doing AAROM flexion/abd/ER, bicep, shrugs, and forward punches with 1# with HHPT over the past week. MD Marlou Sa advised patient for out of sling, AAROM, PROM with no lifting of more than 5# until next followup with him (mid Oct). Pt  reports only 2/10 at rest, 6/10 with motion. Pain is at the medial upper arm and in the armpit. Pain is worse during the evening after moving it all day. Patient lives with her husband and her 4 dogs, which she walks frequently and cares for. She is R handed.Pt denies N/V, B&B changes, unexplained weight fluctuation, saddle paresthesia, fever, night sweats, or unrelenting night pain at this time.Pt denies N/V, B&B changes, unexplained weight fluctuation, saddle paresthesia, fever, night sweats, or unrelenting night pain at this time.    Limitations  House hold activities;Lifting    How long can you sit comfortably?  unlimited    How long can you stand comfortably?  unlimited    How long can you walk comfortably?  unlimited    Diagnostic tests  Xrays    Patient Stated Goals  Being able to complete daily tasks    Pain Onset  1 to 4 weeks ago          THEREX -Arm bike for 2 mins in each direction - Upright row 15# 3x 10 with min cuing for technique with good technique overall  - Bent over row 15# 3x 10 with good ability to self correct scapular retraction following initial cuing - Seated chest press 12# 3x 10/10/8 with min cuing to "roll shoulder back and down" with good understanding of this cue - Farmers carry 1534f x2 10# DB bilat; 253fx2 with some correction at set up with good carry over  - Shrugs 10# 2x 10  - Ball pass L to R hand behind back 2x 10 passes to each hand with some discomfort that subsides following exercise                      PT Education - 05/14/19 1036    Education Details  therex form/technique    Person(s) Educated  Patient    Methods  Explanation;Demonstration;Verbal cues    Comprehension  Verbalized understanding;Returned demonstration;Verbal cues required       PT Short Term Goals - 05/07/19 1349      PT SHORT TERM GOAL #1   Title  Pt will be independent with HEP in order to improve strength and balance in order to improve function at  home    Baseline  01/02/19; 05/07/2019 does them "most of the time"    Time  4    Period  Weeks    Status  Partially Met        PT Long Term Goals - 05/07/19 1350      PT LONG TERM GOAL #1   Title  Pt will decrease quick DASH score by at least 8% in order to  demonstrate clinically significant reduction in disability.    Baseline  02/25/19 56.8%; 05/07/2019 40.9%    Time  8    Period  Weeks    Status  Achieved      PT LONG TERM GOAL #2   Title  Patient will demonstrate full AROM in L shoulder in order to complete household ADLs    Baseline  05/07/2019; flexion:130 ABD 115d IR S2 ER C8; 04/01/19 flex 121d abd 110d IR L PSIS ER C8    Time  8    Period  Weeks    Status  On-going      PT LONG TERM GOAL #3   Title  Pt will decrease worst pain as reported on NPRS by at least 3 points in order to demonstrate clinically significant reduction in pain.    Baseline  02/25/19 2/10 pain with movement; 04/01/19 3/10 with overhead motion; 05/07/2019 4/10 with ADLs    Time  8    Period  Weeks    Status  On-going      PT LONG TERM GOAL #4   Title  Pt will demonstrate gross periscapular strength of 4/5 in order to safely lift for heavy household chores    Baseline  05/07/2019; R/L shoulder flex 4+/4; ABD* 4/4; ER 5/5; IR 5/4; Pariscap Y: 3/3-gradual release from test position; T: 4/4-, I 5/4    Time  8    Period  Weeks    Status  Revised      PT LONG TERM GOAL #5   Title  Patient will be able to carry 30lb backpack 236f in order to demonstrate safety with taking her hunting equipment into the woods    Baseline  04/01/19 10# DB farmers carry with fatigue at 1064fand some increased pain; 05/07/2019 10# DB carry [id number] carry 100 ft at L shoulder) with some fatigue but not increase in pain    Time  6    Period  Weeks    Status  On-going      Additional Long Term Goals   Additional Long Term Goals  Yes      PT LONG TERM GOAL #6   Title  Pt will be able to bend over and pick  up 15# DB and carry 20 feet in order to be able to carry dogs outside and dog food into house    Baseline  05/07/2019; able to pick up 10# DB off the floor and walk 20 feet with medium difficulty and fatigue    Time  6    Period  Weeks    Status  New            Plan - 05/14/19 1048    Clinical Impression Statement  PT continued therex progression with increased resistance to progress toward goals of lifting small dog and carrying backpack. Patient is able to demonstrate proper technique following cuing of all exercise, with great motivation. PT utilized increased rest breaks between sets in order to prevent some post exercise soreness, which patient is very concerned about. PT will continue progression as able.    Personal Factors and Comorbidities  Age;Sex;Behavior Pattern;Comorbidity 1;Fitness    Comorbidities  HTN    Examination-Activity Limitations  Dressing;Carry;Reach Overhead;Lift    Examination-Participation Restrictions  Driving;Community Activity;Cleaning    Clinical Decision Making  Moderate    Rehab Potential  Good    PT Frequency  2x / week    PT Duration  8 weeks    PT  Treatment/Interventions  ADLs/Self Care Home Management;Electrical Stimulation;Therapeutic activities;Passive range of motion;Manual techniques;Patient/family education;Therapeutic exercise;Iontophoresis 59m/ml Dexamethasone;Moist Heat;Traction;Cryotherapy;Ultrasound;Functional mobility training;Neuromuscular re-education;Balance training;Dry needling;Joint Manipulations    PT Next Visit Plan  progress strength therex for goals    PT Home Exercise Plan  [id number]   Consulted and Agree with Plan of Care  Patient       Patient will benefit from skilled therapeutic intervention in order to improve the following deficits and impairments:  Decreased mobility, Decreased endurance, Pain, Postural dysfunction, Impaired UE functional use, Impaired flexibility, Increased fascial restricitons, Decreased strength,  Decreased coordination, Decreased range of motion, Improper body mechanics  Visit Diagnosis: Stiffness of left shoulder, not elsewhere classified  Muscle weakness (generalized)     Problem List Patient Active Problem List   Diagnosis Date Noted  . 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 Chronic left shoulder pain 08/23/2018  . Chronic diarrhea 12/07/2014  . Seg[place] 09/12[place]2014  . Routine general medical examination at a health care facility 06/05/2014  . Overweight 06/13/2011  . ABNORMAL CHEST XRAY 09/30/2007  . Hyperlipidemia 02/17/2007  . Essential hypertension 01/08/2007  . Osteoarthritis 01/08/2007   CShelton SilvasPT, DPT CShelton Silvas2/05/2019, 11:04 AM  [facility] [address] [place] [address]Phone: [phone]  Fax:  [phone] Name: NMARGRETTE WYNIAMRN: [id number] of Birth: 3January 26, 1952

## 2019-05-16 ENCOUNTER — Ambulatory Visit: Payer: Medicare Other | Admitting: Physical Therapy

## 2019-05-16 ENCOUNTER — Other Ambulatory Visit: Payer: Self-pay

## 2019-05-16 ENCOUNTER — Encounter: Payer: Self-pay | Admitting: Physical Therapy

## 2019-05-16 DIAGNOSIS — M25612 Stiffness of left shoulder, not elsewhere classified: Secondary | ICD-10-CM

## 2019-05-16 DIAGNOSIS — M6281 Muscle weakness (generalized): Secondary | ICD-10-CM | POA: Diagnosis not present

## 2019-05-16 DIAGNOSIS — M25512 Pain in left shoulder: Secondary | ICD-10-CM | POA: Diagnosis not present

## 2019-05-16 NOTE — Therapy (Signed)
[facility] [facility] [address], [place], [address] Phone: [phone]   Fax:  [phone]  Physical Therapy Treatment  Patient Details  Name: MARLIYAH REID MRN: [id number] Date of Birth: 11-22-1950 No data recorded  Encounter Date: 05/16/2019  PT End of Session - 05/16/19 1046    Visit Number  40    Number of Visits  49    Date for PT Re-Evaluation  06/17/19    Authorization - Visit Number  3    Authorization - Number of Visits  10    PT Start Time  1030    PT Stop Time  1110    PT Time Calculation (min)  40 min    Activity Tolerance  Patient tolerated treatment well    Behavior During Therapy  [facility] for tasks assessed/performed       Past Medical History:  Diagnosis Date  . Arthritis    back, fingers with joint pain and swelling.  chronic back pain  . Chronic back pain    arthritis   . Diverticulosis   . Elevated cholesterol    takes Niacin daily and Simvastatin  . GERD (gastroesophageal reflux disease) 06/2004   non-specific gastritis on EGD 06/2004  . Headache(784.0)    occasionally  . History of colon polyps 2004, 2009   2004:adenomatous. 2009 hyperplastic.   Marland Kitchen History of hiatal hernia   . Hypertension    takes Tenoretic and Lisinopril daily  . Mucoid cyst of joint 08/2013   right index finger  . Osteopenia 01/2014   T score -1.1 FRAX 14%/0.5%. Stable from prior DEXA  . PONV (postoperative nausea and vomiting)   . Rotator cuff arthropathy    Left  . Seasonal allergies   . Stroke ([place]) 1998   x 2 - mild left-sided weakness  . Urge incontinence   . Uterine prolapse     Past Surgical History:  Procedure Laterality Date  . BACK SURGERY     x 2  . BELPHAROPTOSIS REPAIR Bilateral 12/14/2017  . CATARACT EXTRACTION    . CATARACT EXTRACTION Bilateral 10/2017  . COLONOSCOPY  2004, 2009, 2014  . FINGER SURGERY    . GUM SURGERY    . GYNECOLOGIC CRYOSURGERY    . HYSTEROSCOPY WITH D & C   12/07/2010   with resection of endometrial polyp  . JOINT REPLACEMENT    . KNEE ARTHROSCOPY Left 2008  . lip biopsy     done at MD office Fri 11/22/13  . MASS EXCISION Right 08/15/2013   Procedure: RIGHT INDEX EXCISION MASS ;  Surgeon: Tennis Must, MD;  Location: [place];  Service: Orthopedics;  Laterality: Right;  . NASAL SEPTUM SURGERY    . OOPHORECTOMY Right 2000  . REVERSE SHOULDER ARTHROPLASTY Left 12/11/2018  . REVERSE SHOULDER ARTHROPLASTY Left 12/11/2018   Procedure: LEFT REVERSE SHOULDER ARTHROPLASTY;  Surgeon: Meredith Pel, MD;  Location: [place];  Service: Orthopedics;  Laterality: Left;  . SHOULDER ARTHROSCOPY WITH OPEN ROTATOR CUFF REPAIR AND DISTAL CLAVICLE ACROMINECTOMY Right 11/26/2013   Procedure: RIGHT SHOULDER ARTHROSCOPY WITH MINI OPEN ROTATOR CUFF REPAIR AND DISTAL CLAVICLE RESECTION, SUBACROMIAL DECOMPRESSION, POSSIBLE [facility] PATCH.;  Surgeon: [name], MD;  Location: [place];  Service: Orthopedics;  Laterality: Right;  . TOTAL KNEE ARTHROPLASTY Right 03/21/2017  . TOTAL KNEE ARTHROPLASTY Right 03/21/2017   Procedure: RIGHT TOTAL KNEE ARTHROPLASTY;  Surgeon: [name], MD;  Location: [place];  Service: Orthopedics;  Laterality: Right;  . TUBAL LIGATION      There were no vitals filed for this visit.  Subjective Assessment - 05/16/19 1034    Subjective  Patient reports she had to carry her dog around yesterday after she got spayed, reports only 1/10 pain, mostly just soreness. Happy with progress this far    Pertinent History  Pt is a 69 year old female s/p L reverse total shoulder 12/11/18. Reports she was immobilized in her sling for 2 weeks. After immobilization has been using passive motion machine for flexion. Has been doing AAROM flexion/abd/ER, bicep, shrugs, and forward punches with 1# with HHPT over the past week. MD Marlou Sa advised patient for out of sling, AAROM, PROM with no lifting of more than 5# until next followup with him  (mid Oct). Pt reports only 2/10 at rest, 6/10 with motion. Pain is at the medial upper arm and in the armpit. Pain is worse during the evening after moving it all day. Patient lives with her husband and her 4 dogs, which she walks frequently and cares for. She is R handed.Pt denies N/V, B&B changes, unexplained weight fluctuation, saddle paresthesia, fever, night sweats, or unrelenting night pain at this time.Pt denies N/V, B&B changes, unexplained weight fluctuation, saddle paresthesia, fever, night sweats, or unrelenting night pain at this time.    Limitations  House hold activities;Lifting    How long can you sit comfortably?  unlimited    How long can you stand comfortably?  unlimited       THEREX -Arm bike for 3 mins in each direction AROM assessment with decreased thoracic mobility noted flex to approx 140; abd approx 100d; IR L1 - Standing overhead AAROM PVC pipe flex x12; Seated with 1/2 foam at tspine x12 Full motion with AAROM - Long PVC pipe roations with PVC across shoulders, bilat UE in ER 2x 12 - Seated lat stretch (bilat elbow on table with bottom push back on stool) 6x 10sec hold; 2x 30sec hold - Standing shoulder ext GTB 3x 10 with cuing initially for posture with good carry over following - Standing IR GTB 3x 10 with cuing for posture with good carry over  - Scaption 2# DB 3x 8 with cuing to "set shoulder" with good carry over - Ball pass L to R hand behind back x15 passes to each hand with min cuing for upright posture                        PT Education - 05/16/19 1046    Education Details  therex form/technique, importance of thoracic mobility    Person(s) Educated  Patient    Methods  Explanation;Demonstration;Verbal cues    Comprehension  Verbalized understanding;Returned demonstration;Verbal cues required       PT Short Term Goals - 05/07/19 1349      PT SHORT TERM GOAL #1   Title  Pt will be independent with HEP in order to improve strength  and balance in order to improve function at home    Baseline  01/02/19; 05/07/2019 does them "most of the time"    Time  4    Period  Weeks    Status  Partially Met        PT Long Term Goals - 05/07/19 1350      PT LONG TERM GOAL #1   Title  Pt will decrease quick DASH score by at least 8% in order to demonstrate clinically significant reduction in disability.  Baseline  02/25/19 56.8%; 05/07/2019 40.9%    Time  8    Period  Weeks    Status  Achieved      PT LONG TERM GOAL #2   Title  Patient will demonstrate full AROM in L shoulder in order to complete household ADLs    Baseline  05/07/2019; flexion:130 ABD 115d IR S2 ER C8; 04/01/19 flex 121d abd 110d IR L PSIS ER C8    Time  8    Period  Weeks    Status  On-going      PT LONG TERM GOAL #3   Title  Pt will decrease worst pain as reported on NPRS by at least 3 points in order to demonstrate clinically significant reduction in pain.    Baseline  02/25/19 2/10 pain with movement; 04/01/19 3/10 with overhead motion; 05/07/2019 4/10 with ADLs    Time  8    Period  Weeks    Status  On-going      PT LONG TERM GOAL #4   Title  Pt will demonstrate gross periscapular strength of 4/5 in order to safely lift for heavy household chores    Baseline  05/07/2019; R/L shoulder flex 4+/4; ABD* 4/4; ER 5/5; IR 5/4; Pariscap Y: 3/3-gradual release from test position; T: 4/4-, I 5/4    Time  8    Period  Weeks    Status  Revised      PT LONG TERM GOAL #5   Title  Patient will be able to carry 30lb backpack 23f in order to demonstrate safety with taking her hunting equipment into the woods    Baseline  04/01/19 10# DB farmers carry with fatigue at 1024fand some increased pain; 05/07/2019 10# DB carry [id number] carry 100 ft at L shoulder) with some fatigue but not increase in pain    Time  6    Period  Weeks    Status  On-going      Additional Long Term Goals   Additional Long Term Goals  Yes      PT LONG TERM GOAL #6    Title  Pt will be able to bend over and pick up 15# DB and carry 20 feet in order to be able to carry dogs outside and dog food into house    Baseline  05/07/2019; able to pick up 10# DB off the floor and walk 20 feet with medium difficulty and fatigue    Time  6    Period  Weeks    Status  New            Plan - 05/16/19 1056    Clinical Impression Statement  Patinet with some increased soreness today following pt session, and lifting her dog yesterday. PT utilized increased mobility therex for thoracic mobility, as well as scapulo-humeral mobility and to decrease muscle soreness with good success. Patient with improved motion and no increase in pain throughout therex. Patient is able to complete all therex with proper technique following demo and cuing with good motivation throughout session. PT will continue progression as able.    Personal Factors and Comorbidities  Age;Sex;Behavior Pattern;Comorbidity 1;Fitness    Comorbidities  HTN    Examination-Activity Limitations  Dressing;Carry;Reach Overhead;Lift    Examination-Participation Restrictions  Driving;Community Activity;Cleaning    Stability/Clinical Decision Making  Evolving/Moderate complexity    Clinical Decision Making  Moderate    Rehab Potential  Good    PT Frequency  2x / week  PT Duration  8 weeks    PT Treatment/Interventions  ADLs/Self Care Home Management;Electrical Stimulation;Therapeutic activities;Passive range of motion;Manual techniques;Patient/family education;Therapeutic exercise;Iontophoresis 85m/ml Dexamethasone;Moist Heat;Traction;Cryotherapy;Ultrasound;Functional mobility training;Neuromuscular re-education;Balance training;Dry needling;Joint Manipulations    PT Next Visit Plan  progress strength therex for goals    PT Home Exercise Plan  [id number]   Consulted and Agree with Plan of Care  Patient       Patient will benefit from skilled therapeutic intervention in order to improve the following deficits and  impairments:  Decreased mobility, Decreased endurance, Pain, Postural dysfunction, Impaired UE functional use, Impaired flexibility, Increased fascial restricitons, Decreased strength, Decreased coordination, Decreased range of motion, Improper body mechanics  Visit Diagnosis: Stiffness of left shoulder, not elsewhere classified  Muscle weakness (generalized)  Acute pain of left shoulder     Problem List Patient Active Problem List   Diagnosis Date Noted  . 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 Chronic left shoulder pain 08/23/2018  . Chronic diarrhea 12/07/2014  . Segmental colitis with rectal bleeding ([place] 09/12/2014  . Routine general medical examination at a health care facility 06/05/2014  . Overweight 06/13/2011  . ABNORMAL CHEST XRAY 09/30/2007  . Hyperlipidemia 02/17/2007  . Essential hypertension 01/08/2007  . Osteoarthritis 01/08/2007   CShelton SilvasPT, DPT CShelton Silvas2/07/2019, 11:09 AM  [facility] [address] [place] [address]Phone: [phone]  Fax:  [phone] Name: NJANIAYA RYSERMRN: [id number] of Birth: [birth year]

## 2019-05-21 ENCOUNTER — Encounter: Payer: Self-pay | Admitting: Physical Therapy

## 2019-05-21 ENCOUNTER — Other Ambulatory Visit: Payer: Self-pay

## 2019-05-21 ENCOUNTER — Ambulatory Visit: Payer: Medicare Other | Admitting: Physical Therapy

## 2019-05-21 DIAGNOSIS — M25512 Pain in left shoulder: Secondary | ICD-10-CM | POA: Diagnosis not present

## 2019-05-21 DIAGNOSIS — M6281 Muscle weakness (generalized): Secondary | ICD-10-CM

## 2019-05-21 DIAGNOSIS — M25612 Stiffness of left shoulder, not elsewhere classified: Secondary | ICD-10-CM | POA: Diagnosis not present

## 2019-05-21 NOTE — Therapy (Signed)
[facility] [address] [place], [address] Phone: [phone]   Fax:  [phone]  Physical Therapy Treatment  Patient Details  Name: [name] MRN: [id number] Date of Birth: 02-Dec-1950 No data recorded  Encounter Date: 05/21/2019  PT End of Session - 05/21/19 1138    Visit Number  41    Number of Visits  33    Date for PT Re-Evaluation  06/17/19    Authorization Type  medicare    Authorization - Visit Number  4    Authorization - Number of Visits  10    PT Start Time  1118    PT Stop Time  1158    PT Time Calculation (min)  40 min    Activity Tolerance  Patient tolerated treatment well    Behavior During Therapy  [facility] for tasks assessed/performed       Past Medical History:  Diagnosis Date  . Arthritis    back, fingers with joint pain and swelling.  chronic back pain  . Chronic back pain    arthritis   . Diverticulosis   . Elevated cholesterol    takes Niacin daily and Simvastatin  . GERD (gastroesophageal reflux disease) 06/2004   non-specific gastritis on EGD 06/2004  . Headache(784.0)    occasionally  . History of colon polyps 2004, 2009   2004:adenomatous. 2009 hyperplastic.   Marland Kitchen History of hiatal hernia   . Hypertension    takes Tenoretic and Lisinopril daily  . Mucoid cyst of joint 08/2013   right index finger  . Osteopenia 01/2014   T score -1.1 FRAX 14%/0.5%. Stable from prior DEXA  . PONV (postoperative nausea and vomiting)   . Rotator cuff arthropathy    Left  . Seasonal allergies   . Stroke ([place]) 1998   x 2 - mild left-sided weakness  . Urge incontinence   . Uterine prolapse     Past Surgical History:  Procedure Laterality Date  . BACK SURGERY     x 2  . BELPHAROPTOSIS REPAIR Bilateral 12/14/2017  . CATARACT EXTRACTION    . CATARACT EXTRACTION Bilateral 10/2017  . COLONOSCOPY  2004, 2009, 2014  . FINGER SURGERY    . GUM SURGERY    . GYNECOLOGIC CRYOSURGERY    .  HYSTEROSCOPY WITH D & C  12/07/2010   with resection of endometrial polyp  . JOINT REPLACEMENT    . KNEE ARTHROSCOPY Left 2008  . lip biopsy     done at MD office Fri 11/22/13  . MASS EXCISION Right 08/15/2013   Procedure: RIGHT INDEX EXCISION MASS ;  Surgeon: Tennis Must, MD;  Location: [place];  Service: Orthopedics;  Laterality: Right;  . NASAL SEPTUM SURGERY    . OOPHORECTOMY Right 2000  . REVERSE SHOULDER ARTHROPLASTY Left 12/11/2018  . REVERSE SHOULDER ARTHROPLASTY Left 12/11/2018   Procedure: LEFT REVERSE SHOULDER ARTHROPLASTY;  Surgeon: Meredith Pel, MD;  Location: [place];  Service: Orthopedics;  Laterality: Left;  . SHOULDER ARTHROSCOPY WITH OPEN ROTATOR CUFF REPAIR AND DISTAL CLAVICLE ACROMINECTOMY Right 11/26/2013   Procedure: RIGHT SHOULDER ARTHROSCOPY WITH MINI OPEN ROTATOR CUFF REPAIR AND DISTAL CLAVICLE RESECTION, SUBACROMIAL DECOMPRESSION, POSSIBLE [facility] PATCH.;  Surgeon: [name], MD;  Location: [place];  Service: Orthopedics;  Laterality: Right;  . TOTAL KNEE ARTHROPLASTY Right 03/21/2017  . TOTAL KNEE ARTHROPLASTY Right 03/21/2017   Procedure: RIGHT TOTAL KNEE ARTHROPLASTY;  Surgeon: [name], MD;  Location: [place];  Service: Orthopedics;  Laterality: Right;  . TUBAL LIGATION      There were no vitals filed for this visit.  Subjective Assessment - 05/21/19 1120    Subjective  Patient reports minimal pain today, but does report that her bilat hand numbess is feeling a little worse today. Is completing HEP with no questions or concerns.    Pertinent History  Pt is a 69 year old female s/p L reverse total shoulder 12/11/18. Reports she was immobilized in her sling for 2 weeks. After immobilization has been using passive motion machine for flexion. Has been doing AAROM flexion/abd/ER, bicep, shrugs, and forward punches with 1# with HHPT over the past week. MD Marlou Sa advised patient for out of sling, AAROM, PROM with no lifting of more than 5#  until next followup with him (mid Oct). Pt reports only 2/10 at rest, 6/10 with motion. Pain is at the medial upper arm and in the armpit. Pain is worse during the evening after moving it all day. Patient lives with her husband and her 4 dogs, which she walks frequently and cares for. She is R handed.Pt denies N/V, B&B changes, unexplained weight fluctuation, saddle paresthesia, fever, night sweats, or unrelenting night pain at this time.Pt denies N/V, B&B changes, unexplained weight fluctuation, saddle paresthesia, fever, night sweats, or unrelenting night pain at this time.    Limitations  House hold activities;Lifting    How long can you sit comfortably?  unlimited    How long can you stand comfortably?  unlimited    How long can you walk comfortably?  unlimited    Diagnostic tests  Xrays    Patient Stated Goals  Being able to complete daily tasks    Pain Onset  1 to 4 weeks ago         THEREX -Arm bike for 3 mins in each direction AROM assessment with decreased thoracic mobility noted flex to approx 170; abd approx 160d; IR L1; ER C8 - Seated AAROM bilat flex/thoracic ext with 1/2 foam at tspine x15 3-5 sec hold Full motion with AAROM - Ball passL to R hand behind back 2 x15 passes to each hand with min cuing for upright posture - Long PVC pipe roations with PVC across shoulders, bilat UE in ER x12 each way 2-3sec hold - Bicep curl to overhead press (standing) 3# DB x5; 2# DB 2x 6 with cuing for maintained scapular retraction to accomplish true overhead with difficulty - High row 10# 3x 10 with cuing to maintain abd bilat with good carry over - Bent over fly GTB x10 with cuing for scapular retraction with good carry over; with 2# DB 2x 10  - Seated lat stretch (bilat elbow on table with bottom push back on stool) 6x 10sec hold; 2x 30sec hold - Standing shoulder ext GTB 3x 10 with cuing initially for posture with good carry over following - Standing IR GTB 3x 10 with cuing for posture with  good carry over  - Scaption 2# DB 3x 8 with cuing to "set shoulder" with good carry over                        PT Education - 05/21/19 1122    Education Details  therex form    Person(s) Educated  Patient    Methods  Explanation;Demonstration;Verbal cues    Comprehension  Verbalized understanding;Returned demonstration;Verbal cues required       PT Short Term Goals -  05/07/19 1349      PT SHORT TERM GOAL #1   Title  Pt will be independent with HEP in order to improve strength and balance in order to improve function at home    Baseline  01/02/19; 05/07/2019 does them "most of the time"    Time  4    Period  Weeks    Status  Partially Met        PT Long Term Goals - 05/07/19 1350      PT LONG TERM GOAL #1   Title  Pt will decrease quick DASH score by at least 8% in order to demonstrate clinically significant reduction in disability.    Baseline  02/25/19 56.8%; 05/07/2019 40.9%    Time  8    Period  Weeks    Status  Achieved      PT LONG TERM GOAL #2   Title  Patient will demonstrate full AROM in L shoulder in order to complete household ADLs    Baseline  05/07/2019; flexion:130 ABD 115d IR S2 ER C8; 04/01/19 flex 121d abd 110d IR L PSIS ER C8    Time  8    Period  Weeks    Status  On-going      PT LONG TERM GOAL #3   Title  Pt will decrease worst pain as reported on NPRS by at least 3 points in order to demonstrate clinically significant reduction in pain.    Baseline  02/25/19 2/10 pain with movement; 04/01/19 3/10 with overhead motion; 05/07/2019 4/10 with ADLs    Time  8    Period  Weeks    Status  On-going      PT LONG TERM GOAL #4   Title  Pt will demonstrate gross periscapular strength of 4/5 in order to safely lift for heavy household chores    Baseline  05/07/2019; R/L shoulder flex 4+/4; ABD* 4/4; ER 5/5; IR 5/4; Pariscap Y: 3/3-gradual release from test position; T: 4/4-, I 5/4    Time  8    Period  Weeks    Status  Revised      PT  LONG TERM GOAL #5   Title  Patient will be able to carry 30lb backpack 223f in order to demonstrate safety with taking her hunting equipment into the woods    Baseline  04/01/19 10# DB farmers carry with fatigue at 1075fand some increased pain; 05/07/2019 10# DB carry [id number] carry 100 ft at L shoulder) with some fatigue but not increase in pain    Time  6    Period  Weeks    Status  On-going      Additional Long Term Goals   Additional Long Term Goals  Yes      PT LONG TERM GOAL #6   Title  Pt will be able to bend over and pick up 15# DB and carry 20 feet in order to be able to carry dogs outside and dog food into house    Baseline  05/07/2019; able to pick up 10# DB off the floor and walk 20 feet with medium difficulty and fatigue    Time  6    Period  Weeks    Status  New            Plan - 05/21/19 1148    Clinical Impression Statement  PT continued therex progression for increased mobility and scapulo-humeral rhythm and strengthening with success. Patient is able to comply with  cuing for corrections, some modifications needed, and is motivated throughout session. Patient as difficulty with maintaining scapular retraction with elevation over 90d, but is continuing to work on this. PT will continue progression as able.    Personal Factors and Comorbidities  Age;Sex;Behavior Pattern;Comorbidity 1;Fitness    Comorbidities  HTN    Examination-Activity Limitations  Dressing;Carry;Reach Overhead;Lift    Examination-Participation Restrictions  Driving;Community Activity;Cleaning    Stability/Clinical Decision Making  Evolving/Moderate complexity    Clinical Decision Making  Moderate    Rehab Potential  Good    PT Frequency  2x / week    PT Duration  8 weeks    PT Treatment/Interventions  ADLs/Self Care Home Management;Electrical Stimulation;Therapeutic activities;Passive range of motion;Manual techniques;Patient/family education;Therapeutic exercise;Iontophoresis 44m/ml  Dexamethasone;Moist Heat;Traction;Cryotherapy;Ultrasound;Functional mobility training;Neuromuscular re-education;Balance training;Dry needling;Joint Manipulations    PT Next Visit Plan  progress strength therex for goals    PT Home Exercise Plan  [id number]   Consulted and Agree with Plan of Care  Patient       Patient will benefit from skilled therapeutic intervention in order to improve the following deficits and impairments:  Decreased mobility, Decreased endurance, Pain, Postural dysfunction, Impaired UE functional use, Impaired flexibility, Increased fascial restricitons, Decreased strength, Decreased coordination, Decreased range of motion, Improper body mechanics  Visit Diagnosis: Stiffness of left shoulder, not elsewhere classified  Muscle weakness (generalized)  Acute pain of left shoulder     Problem List Patient Active Problem List   Diagnosis Date Noted  . 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 Chronic left shoulder pain 08/23/2018  . Chronic diarrhea 12/07/2014  . Segmental colitis with rectal bleeding ([place] 09/12/2014  . Routine general medical examination at a health care facility 06/05/2014  . Overweight 06/13/2011  . ABNORMAL CHEST XRAY 09/30/2007  . Hyperlipidemia 02/17/2007  . Essential hypertension 01/08/2007  . Osteoarthritis 01/08/2007   CShelton SilvasPT, DPT CShelton Silvas2/12/2019, 11:55 AM  [facility] [address] [place] [address]Phone: [phone]  Fax:  [phone] Name: NMERYN SARRACINOMRN: [id number] of Birth: [birth year]

## 2019-05-23 ENCOUNTER — Ambulatory Visit: Payer: Medicare Other | Admitting: Physical Therapy

## 2019-05-23 ENCOUNTER — Encounter: Payer: Self-pay | Admitting: Physical Therapy

## 2019-05-23 DIAGNOSIS — M6281 Muscle weakness (generalized): Secondary | ICD-10-CM

## 2019-05-23 DIAGNOSIS — M25512 Pain in left shoulder: Secondary | ICD-10-CM | POA: Diagnosis not present

## 2019-05-23 DIAGNOSIS — M25612 Stiffness of left shoulder, not elsewhere classified: Secondary | ICD-10-CM

## 2019-05-23 NOTE — Therapy (Signed)
[name] [address], [place], [address] Phone: [phone]   Fax:  [phone]  Physical Therapy Treatment  Patient Details  Name: [name] MRN: [id number] Date of Birth: October 29, 1950 No data recorded  Encounter Date: 05/23/2019  PT End of Session - 05/23/19 1137    Visit Number  42    Number of Visits  38    Date for PT Re-Evaluation  06/17/19    Authorization Type  medicare    Authorization - Visit Number  5    Authorization - Number of Visits  10    PT Start Time  1127    PT Stop Time  1205    PT Time Calculation (min)  38 min    Activity Tolerance  Patient tolerated treatment well    Behavior During Therapy  [facility] for tasks assessed/performed       Past Medical History:  Diagnosis Date  . Arthritis    back, fingers with joint pain and swelling.  chronic back pain  . Chronic back pain    arthritis   . Diverticulosis   . Elevated cholesterol    takes Niacin daily and Simvastatin  . GERD (gastroesophageal reflux disease) 06/2004   non-specific gastritis on EGD 06/2004  . Headache(784.0)    occasionally  . History of colon polyps 2004, 2009   2004:adenomatous. 2009 hyperplastic.   Marland Kitchen History of hiatal hernia   . Hypertension    takes Tenoretic and Lisinopril daily  . Mucoid cyst of joint 08/2013   right index finger  . Osteopenia 01/2014   T score -1.1 FRAX 14%/0.5%. Stable from prior DEXA  . PONV (postoperative nausea and vomiting)   . Rotator cuff arthropathy    Left  . Seasonal allergies   . Stroke ([place]) 1998   x 2 - mild left-sided weakness  . Urge incontinence   . Uterine prolapse     Past Surgical History:  Procedure Laterality Date  . BACK SURGERY     x 2  . BELPHAROPTOSIS REPAIR Bilateral 12/14/2017  . CATARACT EXTRACTION    . CATARACT EXTRACTION Bilateral 10/2017  . COLONOSCOPY  2004, 2009, 2014  . FINGER SURGERY    . GUM SURGERY    . GYNECOLOGIC CRYOSURGERY     . HYSTEROSCOPY WITH D & C  12/07/2010   with resection of endometrial polyp  . JOINT REPLACEMENT    . KNEE ARTHROSCOPY Left 2008  . lip biopsy     done at MD office Fri 11/22/13  . MASS EXCISION Right 08/15/2013   Procedure: RIGHT INDEX EXCISION MASS ;  Surgeon: Tennis Must, MD;  Location: [facility];  Service: Orthopedics;  Laterality: Right;  . NASAL SEPTUM SURGERY    . OOPHORECTOMY Right 2000  . REVERSE SHOULDER ARTHROPLASTY Left 12/11/2018  . REVERSE SHOULDER ARTHROPLASTY Left 12/11/2018   Procedure: LEFT REVERSE SHOULDER ARTHROPLASTY;  Surgeon: Meredith Pel, MD;  Location: [facility];  Service: Orthopedics;  Laterality: Left;  . SHOULDER ARTHROSCOPY WITH OPEN ROTATOR CUFF REPAIR AND DISTAL CLAVICLE ACROMINECTOMY Right 11/26/2013   Procedure: RIGHT SHOULDER ARTHROSCOPY WITH MINI OPEN ROTATOR CUFF REPAIR AND DISTAL CLAVICLE RESECTION, SUBACROMIAL DECOMPRESSION, POSSIBLE [facility] PATCH.;  Surgeon: [name], MD;  Location: [facility];  Service: Orthopedics;  Laterality: Right;  . TOTAL KNEE ARTHROPLASTY Right 03/21/2017  . TOTAL KNEE ARTHROPLASTY Right 03/21/2017   Procedure: RIGHT TOTAL KNEE ARTHROPLASTY;  Surgeon: [name], MD;  Location: [facility];  Service: Orthopedics;  Laterality: Right;  . TUBAL LIGATION      There were no vitals filed for this visit.  Subjective Assessment - 05/23/19 1133    How long can you stand comfortably?  unlimited    How long can you walk comfortably?  unlimited    Diagnostic tests  Xrays    Patient Stated Goals  Being able to complete daily tasks    Pain Onset  1 to 4 weeks ago          THEREX -Arm bike for85mns in each direction AROM assessment with decreased thoracic mobility noted flex to approx 170; abd approx 160d; IR L1; ER C8 - PVC AAROM chest>overhead>occiput>overhead>chest 2x 10  - Seated AAROM bilat flex/thoracic ext with 1/2 foam at tspine x15 3-5 sec hold Full motion with AAROM - Bicep curl to overhead  press (standing) 2# DB 2x 6 with cuing for maintained scapular retraction to accomplish true overhead with difficulty - Bent over fly DB 2x 10  with good carry over from last session, min cuin gfor posture set up - Neutral row 15# 3x 10 with cuing to prevent shoulder rounding at end range, maintaining tension at scap retractors with good carry over - Standing IR GTB 3x 10 with cuing for posture with good carry over  - Scaption 2# DB 3x 8/7/6 with cuing to "set shoulder" with good carry over                      PT Education - 05/23/19 1136    Education Details  therex form    Person(s) Educated  Patient    Methods  Explanation;Demonstration;Verbal cues    Comprehension  Verbalized understanding;Returned demonstration;Verbal cues required       PT Short Term Goals - 05/07/19 1349      PT SHORT TERM GOAL #1   Title  Pt will be independent with HEP in order to improve strength and balance in order to improve function at home    Baseline  01/02/19; 05/07/2019 does them "most of the time"    Time  4    Period  Weeks    Status  Partially Met        PT Long Term Goals - 05/07/19 1350      PT LONG TERM GOAL #1   Title  Pt will decrease quick DASH score by at least 8% in order to demonstrate clinically significant reduction in disability.    Baseline  02/25/19 56.8%; 05/07/2019 40.9%    Time  8    Period  Weeks    Status  Achieved      PT LONG TERM GOAL #2   Title  Patient will demonstrate full AROM in L shoulder in order to complete household ADLs    Baseline  05/07/2019; flexion:130 ABD 115d IR S2 ER C8; 04/01/19 flex 121d abd 110d IR L PSIS ER C8    Time  8    Period  Weeks    Status  On-going      PT LONG TERM GOAL #3   Title  Pt will decrease worst pain as reported on NPRS by at least 3 points in order to demonstrate clinically significant reduction in pain.    Baseline  02/25/19 2/10 pain with movement; 04/01/19 3/10 with overhead motion; 05/07/2019 4/10  with ADLs    Time  8    Period  Weeks    Status  On-going  PT LONG TERM GOAL #4   Title  Pt will demonstrate gross periscapular strength of 4/5 in order to safely lift for heavy household chores    Baseline  05/07/2019; R/L shoulder flex 4+/4; ABD* 4/4; ER 5/5; IR 5/4; Pariscap Y: 3/3-gradual release from test position; T: 4/4-, I 5/4    Time  8    Period  Weeks    Status  Revised      PT LONG TERM GOAL #5   Title  Patient will be able to carry 30lb backpack 268f in order to demonstrate safety with taking her hunting equipment into the woods    Baseline  04/01/19 10# DB farmers carry with fatigue at 1032fand some increased pain; 05/07/2019 10# DB carry [id number] carry 100 ft at L shoulder) with some fatigue but not increase in pain    Time  6    Period  Weeks    Status  On-going      Additional Long Term Goals   Additional Long Term Goals  Yes      PT LONG TERM GOAL #6   Title  Pt will be able to bend over and pick up 15# DB and carry 20 feet in order to be able to carry dogs outside and dog food into house    Baseline  05/07/2019; able to pick up 10# DB off the floor and walk 20 feet with medium difficulty and fatigue    Time  6    Period  Weeks    Status  New            Plan - 05/23/19 1140    Clinical Impression Statement  PT continued therex progression for increased scapular stability and strengthening with good success. Pt is demonstrating good carry over of motor control between sessions of therex techniques. Pt is motivated throughout session and is able to complete all therex with proper technique following cuing. PT will continue progression as able.    Personal Factors and Comorbidities  Age;Sex;Behavior Pattern;Comorbidity 1;Fitness    Comorbidities  HTN    Examination-Activity Limitations  Dressing;Carry;Reach Overhead;Lift    Examination-Participation Restrictions  Driving;Community Activity;Cleaning    Stability/Clinical Decision Making   Evolving/Moderate complexity    Clinical Decision Making  Moderate    Rehab Potential  Good    PT Frequency  2x / week    PT Duration  8 weeks    PT Treatment/Interventions  ADLs/Self Care Home Management;Electrical Stimulation;Therapeutic activities;Passive range of motion;Manual techniques;Patient/family education;Therapeutic exercise;Iontophoresis 4mg/27mDexamethasone;Moist Heat;Traction;Cryotherapy;Ultrasound;Functional mobility training;Neuromuscular re-education;Balance training;Dry needling;Joint Manipulations    PT Next Visit Plan  progress strength therex for goals    PT Home Exercise Plan  [id number] and Agree with Plan of Care  Patient       Patient will benefit from skilled therapeutic intervention in order to improve the following deficits and impairments:  Decreased mobility, Decreased endurance, Pain, Postural dysfunction, Impaired UE functional use, Impaired flexibility, Increased fascial restricitons, Decreased strength, Decreased coordination, Decreased range of motion, Improper body mechanics  Visit Diagnosis: Stiffness of left shoulder, not elsewhere classified  Muscle weakness (generalized)  Acute pain of left shoulder     Problem List Patient Active Problem List   Diagnosis Date Noted  . 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 Chronic left shoulder pain 08/23/2018  . Chronic diarrhea 12/07/2014  . Segmental colitis with rectal bleeding ([facility]) [place]03/2016  . Routine general medical examination at a health care facility 06/05/2014  .  Overweight 06/13/2011  . ABNORMAL CHEST XRAY 09/30/2007  . Hyperlipidemia 02/17/2007  . Essential hypertension 01/08/2007  . Osteoarthritis 01/08/2007   Shelton Silvas PT, DPT Shelton Silvas 05/23/2019, 12:20 PM  [facility] [address] [place], [address] Phone: [phone]   Fax:  [phone]  Name: NALIA HONEYCUTT MRN:  [id number] Date of Birth: 02-20-1951

## 2019-05-26 ENCOUNTER — Ambulatory Visit: Payer: Medicare Other | Attending: Internal Medicine

## 2019-05-26 DIAGNOSIS — Z23 Encounter for immunization: Secondary | ICD-10-CM | POA: Insufficient documentation

## 2019-05-26 NOTE — Progress Notes (Signed)
   Covid-19 Vaccination Clinic  Name:  [name]    MRN: [id number] DOB: [birth year]  05/26/2019  [name] was observed post Covid-19 immunization for 15 minutes without incidence. She was provided with Vaccine Information Sheet and instruction to access the V-Safe system.   Ms. Saline was instructed to call 911 with any severe reactions post vaccine: Marland Kitchen Difficulty breathing  . Swelling of your face and throat  . A fast heartbeat  . A bad rash all over your body  . Dizziness and weakness    Immunizations Administered    Name Date Dose VIS Date Route   Pfizer COVID-19 Vaccine 05/26/2019  9:38 AM 0.3 mL 03/22/2019 Intramuscular   Manufacturer: [place]   Lot: [id number]   Roy: [id number]

## 2019-05-27 ENCOUNTER — Ambulatory Visit: Payer: Medicare Other | Admitting: Physical Therapy

## 2019-05-27 ENCOUNTER — Encounter: Payer: Self-pay | Admitting: Physical Therapy

## 2019-05-27 ENCOUNTER — Other Ambulatory Visit: Payer: Self-pay

## 2019-05-27 ENCOUNTER — Telehealth: Payer: Self-pay

## 2019-05-27 DIAGNOSIS — M25512 Pain in left shoulder: Secondary | ICD-10-CM

## 2019-05-27 DIAGNOSIS — M6281 Muscle weakness (generalized): Secondary | ICD-10-CM

## 2019-05-27 DIAGNOSIS — M25612 Stiffness of left shoulder, not elsewhere classified: Secondary | ICD-10-CM

## 2019-05-27 NOTE — Telephone Encounter (Signed)
Yes she should take it one hour prior

## 2019-05-27 NOTE — Therapy (Signed)
[facility] [address] [place], [address] Phone: [phone]   Fax:  [phone]  Physical Therapy Treatment  Patient Details  Name: [name] MRN: [id number] Date of Birth: 01-Jul-1950 No data recorded  Encounter Date: 05/27/2019  PT End of Session - 05/27/19 1554    Visit Number  43    Number of Visits  3    Date for PT Re-Evaluation  06/17/19    Authorization Type  medicare    Authorization - Visit Number  6    Authorization - Number of Visits  10    PT Start Time  0340    PT Stop Time  0420    PT Time Calculation (min)  40 min    Activity Tolerance  Patient tolerated treatment well    Behavior During Therapy  [facility] for tasks assessed/performed       Past Medical History:  Diagnosis Date  . Arthritis    back, fingers with joint pain and swelling.  chronic back pain  . Chronic back pain    arthritis   . Diverticulosis   . Elevated cholesterol    takes Niacin daily and Simvastatin  . GERD (gastroesophageal reflux disease) 06/2004   non-specific gastritis on EGD 06/2004  . Headache(784.0)    occasionally  . History of colon polyps 2004, 2009   2004:adenomatous. 2009 hyperplastic.   Marland Kitchen History of hiatal hernia   . Hypertension    takes Tenoretic and Lisinopril daily  . Mucoid cyst of joint 08/2013   right index finger  . Osteopenia 01/2014   T score -1.1 FRAX 14%/0.5%. Stable from prior DEXA  . PONV (postoperative nausea and vomiting)   . Rotator cuff arthropathy    Left  . Seasonal allergies   . Stroke ([place]) 1998   x 2 - mild left-sided weakness  . Urge incontinence   . Uterine prolapse     Past Surgical History:  Procedure Laterality Date  . BACK SURGERY     x 2  . BELPHAROPTOSIS REPAIR Bilateral 12/14/2017  . CATARACT EXTRACTION    . CATARACT EXTRACTION Bilateral 10/2017  . COLONOSCOPY  2004, 2009, 2014  . FINGER SURGERY    . GUM SURGERY    . GYNECOLOGIC CRYOSURGERY     . HYSTEROSCOPY WITH D & C  12/07/2010   with resection of endometrial polyp  . JOINT REPLACEMENT    . KNEE ARTHROSCOPY Left 2008  . lip biopsy     done at MD office Fri 11/22/13  . MASS EXCISION Right 08/15/2013   Procedure: RIGHT INDEX EXCISION MASS ;  Surgeon: Tennis Must, MD;  Location: [facility];  Service: Orthopedics;  Laterality: Right;  . NASAL SEPTUM SURGERY    . OOPHORECTOMY Right 2000  . REVERSE SHOULDER ARTHROPLASTY Left 12/11/2018  . REVERSE SHOULDER ARTHROPLASTY Left 12/11/2018   Procedure: LEFT REVERSE SHOULDER ARTHROPLASTY;  Surgeon: Meredith Pel, MD;  Location: [facility];  Service: Orthopedics;  Laterality: Left;  . SHOULDER ARTHROSCOPY WITH OPEN ROTATOR CUFF REPAIR AND DISTAL CLAVICLE ACROMINECTOMY Right 11/26/2013   Procedure: RIGHT SHOULDER ARTHROSCOPY WITH MINI OPEN ROTATOR CUFF REPAIR AND DISTAL CLAVICLE RESECTION, SUBACROMIAL DECOMPRESSION, POSSIBLE [facility] PATCH.;  Surgeon: [name], MD;  Location: [facility];  Service: Orthopedics;  Laterality: Right;  . TOTAL KNEE ARTHROPLASTY Right 03/21/2017  . TOTAL KNEE ARTHROPLASTY Right 03/21/2017   Procedure: RIGHT TOTAL KNEE ARTHROPLASTY;  Surgeon: [name], MD;  Location: [facility];  Service: Orthopedics;  Laterality: Right;  . TUBAL LIGATION      There were no vitals filed for this visit.  Subjective Assessment - 05/27/19 1546    Subjective  Patient repots minimal pain today, some soreness. Reports some fatigue following COVID shot yesterday.    Pertinent History  Pt is a 69 year old female s/p L reverse total shoulder 12/11/18. Reports she was immobilized in her sling for 2 weeks. After immobilization has been using passive motion machine for flexion. Has been doing AAROM flexion/abd/ER, bicep, shrugs, and forward punches with 1# with HHPT over the past week. MD Marlou Sa advised patient for out of sling, AAROM, PROM with no lifting of more than 5# until next followup with him (mid Oct). Pt reports  only 2/10 at rest, 6/10 with motion. Pain is at the medial upper arm and in the armpit. Pain is worse during the evening after moving it all day. Patient lives with her husband and her 4 dogs, which she walks frequently and cares for. She is R handed.Pt denies N/V, B&B changes, unexplained weight fluctuation, saddle paresthesia, fever, night sweats, or unrelenting night pain at this time.Pt denies N/V, B&B changes, unexplained weight fluctuation, saddle paresthesia, fever, night sweats, or unrelenting night pain at this time.    Limitations  House hold activities;Lifting    How long can you sit comfortably?  unlimited    How long can you stand comfortably?  unlimited    How long can you walk comfortably?  unlimited    Diagnostic tests  Xrays    Patient Stated Goals  Being able to complete daily tasks    Pain Onset  1 to 4 weeks ago          THEREX -Arm bike for2mns in each directionC8 - PVC AAROM chest>overhead>top of headt>overhead>chest 2x 10  - SeatedAAROM bilat flex/thoracic extwith full foam at tspine x15 3-5 sec holdFull motion with AAROM - Bicep curl to overhead press (standing) 2# DB 2x 8/7/6 with cuing for maintained scapular retraction to accomplish true overhead with difficulty - Upright row 15# 3x 10 with cuing initially for posture with good carry over following - Wall slides with RTB 3x 10 with fairly consistent cuing needed  - Standing IR GTB 3x 10 with cuing for posture with good carry over  - Scaption 2# DB 3x 10/9/8 with cuing to "set shoulder" with good carry over                      PT Education - 05/27/19 1548    Education Details  therex form    Person(s) Educated  Patient    Methods  Explanation;Demonstration;Tactile cues;Verbal cues    Comprehension  Verbalized understanding;Returned demonstration;Verbal cues required;Tactile cues required       PT Short Term Goals - 05/07/19 1349      PT SHORT TERM GOAL #1   Title  Pt will be  independent with HEP in order to improve strength and balance in order to improve function at home    Baseline  01/02/19; 05/07/2019 does them "most of the time"    Time  4    Period  Weeks    Status  Partially Met        PT Long Term Goals - 05/07/19 1350      PT LONG TERM GOAL #1   Title  Pt will decrease quick DASH score by at least 8% in order to demonstrate  clinically significant reduction in disability.    Baseline  02/25/19 56.8%; 05/07/2019 40.9%    Time  8    Period  Weeks    Status  Achieved      PT LONG TERM GOAL #2   Title  Patient will demonstrate full AROM in L shoulder in order to complete household ADLs    Baseline  05/07/2019; flexion:130 ABD 115d IR S2 ER C8; 04/01/19 flex 121d abd 110d IR L PSIS ER C8    Time  8    Period  Weeks    Status  On-going      PT LONG TERM GOAL #3   Title  Pt will decrease worst pain as reported on NPRS by at least 3 points in order to demonstrate clinically significant reduction in pain.    Baseline  02/25/19 2/10 pain with movement; 04/01/19 3/10 with overhead motion; 05/07/2019 4/10 with ADLs    Time  8    Period  Weeks    Status  On-going      PT LONG TERM GOAL #4   Title  Pt will demonstrate gross periscapular strength of 4/5 in order to safely lift for heavy household chores    Baseline  05/07/2019; R/L shoulder flex 4+/4; ABD* 4/4; ER 5/5; IR 5/4; Pariscap Y: 3/3-gradual release from test position; T: 4/4-, I 5/4    Time  8    Period  Weeks    Status  Revised      PT LONG TERM GOAL #5   Title  Patient will be able to carry 30lb backpack 251f in order to demonstrate safety with taking her hunting equipment into the woods    Baseline  04/01/19 10# DB farmers carry with fatigue at 1042fand some increased pain; 05/07/2019 10# DB carry [id number] carry 100 ft at L shoulder) with some fatigue but not increase in pain    Time  6    Period  Weeks    Status  On-going      Additional Long Term Goals   Additional Long  Term Goals  Yes      PT LONG TERM GOAL #6   Title  Pt will be able to bend over and pick up 15# DB and carry 20 feet in order to be able to carry dogs outside and dog food into house    Baseline  05/07/2019; able to pick up 10# DB off the floor and walk 20 feet with medium difficulty and fatigue    Time  6    Period  Weeks    Status  New            Plan - 05/27/19 1623    Clinical Impression Statement  PT continued therex progression for increased shoulder and scapular stability and strength with good success. Pt is able to tolerate intensity increase with good motivation throughout. PT will continue progression as able.    Personal Factors and Comorbidities  Age;Sex;Behavior Pattern;Comorbidity 1;Fitness    Examination-Activity Limitations  Dressing;Carry;Reach Overhead;Lift    Examination-Participation Restrictions  Driving;Community Activity;Cleaning    Stability/Clinical Decision Making  Evolving/Moderate complexity    Clinical Decision Making  Moderate    Rehab Potential  Good    PT Frequency  2x / week    PT Duration  8 weeks    PT Treatment/Interventions  ADLs/Self Care Home Management;Electrical Stimulation;Therapeutic activities;Passive range of motion;Manual techniques;Patient/family education;Therapeutic exercise;Iontophoresis 4mg/61mDexamethasone;Moist Heat;Traction;Cryotherapy;Ultrasound;Functional mobility training;Neuromuscular re-education;Balance training;Dry needling;Joint Manipulations  PT Next Visit Plan  progress strength therex for goals    PT Home Exercise Plan  [id number]    Consulted and Agree with Plan of Care  Patient       Patient will benefit from skilled therapeutic intervention in order to improve the following deficits and impairments:  Decreased mobility, Decreased endurance, Pain, Postural dysfunction, Impaired UE functional use, Impaired flexibility, Increased fascial restricitons, Decreased strength, Decreased coordination, Decreased range of  motion, Improper body mechanics  Visit Diagnosis: Stiffness of left shoulder, not elsewhere classified  Muscle weakness (generalized)  Acute pain of left shoulder     Problem List Patient Active Problem List   Diagnosis Date Noted  . 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 Chronic left shoulder pain 08/23/2018  . Chronic diarrhea 12/07/2014  . Segmental colitis with rectal bleeding ([place]) 09/12/2014  . Routine general medical examination at a health care facility 06/05/2014  . Overweight 06/13/2011  . ABNORMAL CHEST XRAY 09/30/2007  . Hyperlipidemia 02/17/2007  . Essential hypertension 01/08/2007  . Osteoarthritis 01/08/2007   Shelton Silvas PT, DPT Shelton Silvas 05/27/2019, 4:26 PM  [facility] [address], [place], [address] Phone: [phone]   Fax:  [phone]  Name: [name] MRN: [id number] Date of Birth: 1951-01-03

## 2019-05-27 NOTE — Telephone Encounter (Signed)
New message    The patient has a dentist's appt today @ 2:00 pm.  Should she take her amoxicillin (AMOXIL) 500 MG tablet before the visit?    COVID first vaccine is done on 05/26/19

## 2019-05-27 NOTE — Telephone Encounter (Signed)
MD is out of the office will forward to another provider for response. Pls advise on msg below.Marland KitchenJohny Chess

## 2019-05-27 NOTE — Telephone Encounter (Signed)
Notified pt w/MD response.../lmb 

## 2019-05-29 ENCOUNTER — Other Ambulatory Visit: Payer: Self-pay

## 2019-05-29 ENCOUNTER — Ambulatory Visit: Payer: Medicare Other | Admitting: Physical Therapy

## 2019-05-29 ENCOUNTER — Encounter: Payer: Self-pay | Admitting: Physical Therapy

## 2019-05-29 DIAGNOSIS — M25512 Pain in left shoulder: Secondary | ICD-10-CM

## 2019-05-29 DIAGNOSIS — M25612 Stiffness of left shoulder, not elsewhere classified: Secondary | ICD-10-CM | POA: Diagnosis not present

## 2019-05-29 DIAGNOSIS — M6281 Muscle weakness (generalized): Secondary | ICD-10-CM

## 2019-05-29 NOTE — Therapy (Addendum)
[facility] [address], [place], [address] Phone: [phone]   Fax:  [phone]  Physical Therapy Treatment  Patient Details  Name: [name] MRN: [id number] Date of Birth: Aug 13, 1950 No data recorded  Encounter Date: 05/29/2019  PT End of Session - 05/29/19 1407    Visit Number  97    Number of Visits  17    Date for PT Re-Evaluation  06/17/19    Authorization Type  medicare    Authorization - Visit Number  7    Authorization - Number of Visits  10    PT Start Time  0923    PT Stop Time  1425    PT Time Calculation (min)  40 min    Activity Tolerance  Patient tolerated treatment well    Behavior During Therapy  [facility] for tasks assessed/performed       Past Medical History:  Diagnosis Date  . Arthritis    back, fingers with joint pain and swelling.  chronic back pain  . Chronic back pain    arthritis   . Diverticulosis   . Elevated cholesterol    takes Niacin daily and Simvastatin  . GERD (gastroesophageal reflux disease) 06/2004   non-specific gastritis on EGD 06/2004  . Headache(784.0)    occasionally  . History of colon polyps 2004, 2009   2004:adenomatous. 2009 hyperplastic.   Marland Kitchen History of hiatal hernia   . Hypertension    takes Tenoretic and Lisinopril daily  . Mucoid cyst of joint 08/2013   right index finger  . Osteopenia 01/2014   T score -1.1 FRAX 14%/0.5%. Stable from prior DEXA  . PONV (postoperative nausea and vomiting)   . Rotator cuff arthropathy    Left  . Seasonal allergies   . Stroke ([place]) 1998   x 2 - mild left-sided weakness  . Urge incontinence   . Uterine prolapse     Past Surgical History:  Procedure Laterality Date  . BACK SURGERY     x 2  . BELPHAROPTOSIS REPAIR Bilateral 12/14/2017  . CATARACT EXTRACTION    . CATARACT EXTRACTION Bilateral 10/2017  . COLONOSCOPY  2004, 2009, 2014  . FINGER SURGERY    . GUM SURGERY    . GYNECOLOGIC CRYOSURGERY     . HYSTEROSCOPY WITH D & C  12/07/2010   with resection of endometrial polyp  . JOINT REPLACEMENT    . KNEE ARTHROSCOPY Left 2008  . lip biopsy     done at MD office Fri 11/22/13  . MASS EXCISION Right 08/15/2013   Procedure: RIGHT INDEX EXCISION MASS ;  Surgeon: Tennis Must, MD;  Location: [place];  Service: Orthopedics;  Laterality: Right;  . NASAL SEPTUM SURGERY    . OOPHORECTOMY Right 2000  . REVERSE SHOULDER ARTHROPLASTY Left 12/11/2018  . REVERSE SHOULDER ARTHROPLASTY Left 12/11/2018   Procedure: LEFT REVERSE SHOULDER ARTHROPLASTY;  Surgeon: Meredith Pel, MD;  Location: [place];  Service: Orthopedics;  Laterality: Left;  . SHOULDER ARTHROSCOPY WITH OPEN ROTATOR CUFF REPAIR AND DISTAL CLAVICLE ACROMINECTOMY Right 11/26/2013   Procedure: RIGHT SHOULDER ARTHROSCOPY WITH MINI OPEN ROTATOR CUFF REPAIR AND DISTAL CLAVICLE RESECTION, SUBACROMIAL DECOMPRESSION, POSSIBLE [facility] PATCH.;  Surgeon: [name], MD;  Location: [place];  Service: Orthopedics;  Laterality: Right;  . TOTAL KNEE ARTHROPLASTY Right 03/21/2017  . TOTAL KNEE ARTHROPLASTY Right 03/21/2017   Procedure: RIGHT TOTAL KNEE ARTHROPLASTY;  Surgeon: [name], MD;  Location: [place];  Service: Orthopedics;  Laterality: Right;  . TUBAL LIGATION      There were no vitals filed for this visit.  Subjective Assessment - 05/29/19 1345    Subjective  Pt reports minimal pain in shoulder, and some soreness. HEP has been going well, does well to take recovery days in between session.    Pertinent History  Pt is a 69 year old female s/p L reverse total shoulder 12/11/18. Reports she was immobilized in her sling for 2 weeks. After immobilization has been using passive motion machine for flexion. Has been doing AAROM flexion/abd/ER, bicep, shrugs, and forward punches with 1# with HHPT over the past week. MD Marlou Sa advised patient for out of sling, AAROM, PROM with no lifting of more than 5# until next followup with  him (mid Oct). Pt reports only 2/10 at rest, 6/10 with motion. Pain is at the medial upper arm and in the armpit. Pain is worse during the evening after moving it all day. Patient lives with her husband and her 4 dogs, which she walks frequently and cares for. She is R handed.Pt denies N/V, B&B changes, unexplained weight fluctuation, saddle paresthesia, fever, night sweats, or unrelenting night pain at this time.Pt denies N/V, B&B changes, unexplained weight fluctuation, saddle paresthesia, fever, night sweats, or unrelenting night pain at this time.    Limitations  House hold activities;Lifting    How long can you sit comfortably?  unlimited    How long can you stand comfortably?  unlimited    How long can you walk comfortably?  unlimited    Diagnostic tests  Xrays    Patient Stated Goals  Being able to complete daily tasks         THEREX -UE bike 2 minutes each direction for gentle strengthening and warm-up -Attempted GTB ER to [facility] press, but pt is unable to perform so PT regressed -ER walk outs with GTB 3x8 heavy tactile cueing for keeping shoulder pulled down and retracted  -Standing OH Press with 2# weights 3x8; with heavy cueing for keeping shoulder down  -Standing single arm row 5# 3x10,8,8 with cueing to keep shoulder down and back with longer rest breaks  -Lat pull down 15# 3x8 with good carryover for scapular retraction and keeping down and back  -Farmer's carry 8# 100 ft, 150 ft; with cueing for scapular retraction  -Seated triceps stretch on table 3x30 sec; pt had to decrease range to stay out of pain with                       PT Education - 05/29/19 1428    Education Details  therex form    Person(s) Educated  Patient    Methods  Explanation;Demonstration;Tactile cues    Comprehension  Verbalized understanding       PT Short Term Goals - 05/07/19 1349      PT SHORT TERM GOAL #1   Title  Pt will be independent with HEP in order to improve strength and  balance in order to improve function at home    Baseline  01/02/19; 05/07/2019 does them "most of the time"    Time  4    Period  Weeks    Status  Partially Met        PT Long Term Goals - 05/07/19 1350      PT LONG TERM GOAL #1   Title  Pt will decrease quick DASH score by at least 8%  in order to demonstrate clinically significant reduction in disability.    Baseline  02/25/19 56.8%; 05/07/2019 40.9%    Time  8    Period  Weeks    Status  Achieved      PT LONG TERM GOAL #2   Title  Patient will demonstrate full AROM in L shoulder in order to complete household ADLs    Baseline  05/07/2019; flexion:130 ABD 115d IR S2 ER C8; 04/01/19 flex 121d abd 110d IR L PSIS ER C8    Time  8    Period  Weeks    Status  On-going      PT LONG TERM GOAL #3   Title  Pt will decrease worst pain as reported on NPRS by at least 3 points in order to demonstrate clinically significant reduction in pain.    Baseline  02/25/19 2/10 pain with movement; 04/01/19 3/10 with overhead motion; 05/07/2019 4/10 with ADLs    Time  8    Period  Weeks    Status  On-going      PT LONG TERM GOAL #4   Title  Pt will demonstrate gross periscapular strength of 4/5 in order to safely lift for heavy household chores    Baseline  05/07/2019; R/L shoulder flex 4+/4; ABD* 4/4; ER 5/5; IR 5/4; Pariscap Y: 3/3-gradual release from test position; T: 4/4-, I 5/4    Time  8    Period  Weeks    Status  Revised      PT LONG TERM GOAL #5   Title  Patient will be able to carry 30lb backpack 238f in order to demonstrate safety with taking her hunting equipment into the woods    Baseline  04/01/19 10# DB farmers carry with fatigue at 1023fand some increased pain; 05/07/2019 10# DB carry [id number] carry 100 ft at L shoulder) with some fatigue but not increase in pain    Time  6    Period  Weeks    Status  On-going      Additional Long Term Goals   Additional Long Term Goals  Yes      PT LONG TERM GOAL #6   Title   Pt will be able to bend over and pick up 15# DB and carry 20 feet in order to be able to carry dogs outside and dog food into house    Baseline  05/07/2019; able to pick up 10# DB off the floor and walk 20 feet with medium difficulty and fatigue    Time  6    Period  Weeks    Status  New            Plan - 05/29/19 1428    Clinical Impression Statement  PT progressed strength therex to focus on OH motion and paracapular strength with good carryover from pt. Pt demonstrates some apprehension with increasing load and farmer's carry, but was able to perform with good form after cues from PT. Pt is making good gains towards achieving goals. PT will continue progressions as appropriate.    Personal Factors and Comorbidities  Age;Sex;Behavior Pattern;Comorbidity 1;Fitness    Comorbidities  HTN    Examination-Activity Limitations  Dressing;Carry;Reach Overhead;Lift    Examination-Participation Restrictions  Driving;Community Activity;Cleaning    Stability/Clinical Decision Making  Evolving/Moderate complexity    Clinical Decision Making  Moderate    Rehab Potential  Good    PT Frequency  2x / week    PT Duration  8  weeks    PT Treatment/Interventions  ADLs/Self Care Home Management;Electrical Stimulation;Therapeutic activities;Passive range of motion;Manual techniques;Patient/family education;Therapeutic exercise;Iontophoresis 3m/ml Dexamethasone;Moist Heat;Traction;Cryotherapy;Ultrasound;Functional mobility training;Neuromuscular re-education;Balance training;Dry needling;Joint Manipulations    PT Next Visit Plan  progress strength therex for goals    PT Home Exercise Plan  [email or web address]   Consulted and Agree with Plan of Care  Patient       Patient will benefit from skilled therapeutic intervention in order to improve the following deficits and impairments:  Decreased mobility, Decreased endurance, Pain, Postural dysfunction, Impaired UE functional use, Impaired flexibility, Increased fascial  restricitons, Decreased strength, Decreased coordination, Decreased range of motion, Improper body mechanics  Visit Diagnosis: Stiffness of left shoulder, not elsewhere classified  Muscle weakness (generalized)  Acute pain of left shoulder     Problem List Patient Active Problem List   Diagnosis Date Noted  . 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 Chronic left shoulder pain 08/23/2018  . Chronic diarrhea 12/07/2014  . [place] 09/12/[place]  . Routine general medical examination at a health care facility 06/05/2014  . Overweight 06/13/2011  . ABNORMAL CHEST XRAY 09/30/2007  . Hyperlipidemia 02/17/2007  . Essential hypertension 01/08/2007  . Osteoarthritis 01/08/2007   CShelton SilvasPT, DPT JIvin Booty SPT CShelton Silvas2/17/2021, 3:09 PM  [facility] [address] [place] [address]Phone: [phone]  Fax:  [phone] Name: Stacy KINDLERMRN: [id number] of Birth: [birth year]

## 2019-06-03 ENCOUNTER — Other Ambulatory Visit: Payer: Self-pay

## 2019-06-03 ENCOUNTER — Ambulatory Visit: Payer: Medicare Other | Admitting: Physical Therapy

## 2019-06-03 ENCOUNTER — Encounter: Payer: Self-pay | Admitting: Physical Therapy

## 2019-06-03 DIAGNOSIS — M25512 Pain in left shoulder: Secondary | ICD-10-CM

## 2019-06-03 DIAGNOSIS — M6281 Muscle weakness (generalized): Secondary | ICD-10-CM

## 2019-06-03 DIAGNOSIS — M25612 Stiffness of left shoulder, not elsewhere classified: Secondary | ICD-10-CM

## 2019-06-03 NOTE — Therapy (Signed)
[facility] [address] [place], [address] Phone: [phone]   Fax:  [phone]  Physical Therapy Treatment  Patient Details  Name: [name] MRN: [id number] Date of Birth: 10-Feb-1951 No data recorded  Encounter Date: 06/03/2019  PT End of Session - 06/03/19 1607    Visit Number  45    Number of Visits  10    Date for PT Re-Evaluation  06/17/19    Authorization Type  medicare    Authorization - Visit Number  8    Authorization - Number of Visits  10    PT Start Time  0400    PT Stop Time  0440    PT Time Calculation (min)  40 min    Activity Tolerance  Patient tolerated treatment well    Behavior During Therapy  [facility] for tasks assessed/performed       Past Medical History:  Diagnosis Date  . Arthritis    back, fingers with joint pain and swelling.  chronic back pain  . Chronic back pain    arthritis   . Diverticulosis   . Elevated cholesterol    takes Niacin daily and Simvastatin  . GERD (gastroesophageal reflux disease) 06/2004   non-specific gastritis on EGD 06/2004  . Headache(784.0)    occasionally  . History of colon polyps 2004, 2009   2004:adenomatous. 2009 hyperplastic.   Marland Kitchen History of hiatal hernia   . Hypertension    takes Tenoretic and Lisinopril daily  . Mucoid cyst of joint 08/2013   right index finger  . Osteopenia 01/2014   T score -1.1 FRAX 14%/0.5%. Stable from prior DEXA  . PONV (postoperative nausea and vomiting)   . Rotator cuff arthropathy    Left  . Seasonal allergies   . Stroke ([place]) 1998   x 2 - mild left-sided weakness  . Urge incontinence   . Uterine prolapse     Past Surgical History:  Procedure Laterality Date  . BACK SURGERY     x 2  . BELPHAROPTOSIS REPAIR Bilateral 12/14/2017  . CATARACT EXTRACTION    . CATARACT EXTRACTION Bilateral 10/2017  . COLONOSCOPY  2004, 2009, 2014  . FINGER SURGERY    . GUM SURGERY    . GYNECOLOGIC CRYOSURGERY     . HYSTEROSCOPY WITH D & C  12/07/2010   with resection of endometrial polyp  . JOINT REPLACEMENT    . KNEE ARTHROSCOPY Left 2008  . lip biopsy     done at MD office Fri 11/22/13  . MASS EXCISION Right 08/15/2013   Procedure: RIGHT INDEX EXCISION MASS ;  Surgeon: Tennis Must, MD;  Location: [place];  Service: Orthopedics;  Laterality: Right;  . NASAL SEPTUM SURGERY    . OOPHORECTOMY Right 2000  . REVERSE SHOULDER ARTHROPLASTY Left 12/11/2018  . REVERSE SHOULDER ARTHROPLASTY Left 12/11/2018   Procedure: LEFT REVERSE SHOULDER ARTHROPLASTY;  Surgeon: Meredith Pel, MD;  Location: [place];  Service: Orthopedics;  Laterality: Left;  . SHOULDER ARTHROSCOPY WITH OPEN ROTATOR CUFF REPAIR AND DISTAL CLAVICLE ACROMINECTOMY Right 11/26/2013   Procedure: RIGHT SHOULDER ARTHROSCOPY WITH MINI OPEN ROTATOR CUFF REPAIR AND DISTAL CLAVICLE RESECTION, SUBACROMIAL DECOMPRESSION, POSSIBLE [facility] PATCH.;  Surgeon: [name], MD;  Location: [place];  Service: Orthopedics;  Laterality: Right;  . TOTAL KNEE ARTHROPLASTY Right 03/21/2017  . TOTAL KNEE ARTHROPLASTY Right 03/21/2017   Procedure: RIGHT TOTAL KNEE ARTHROPLASTY;  Surgeon: [name], MD;  Location: [place];  Service: Orthopedics;  Laterality: Right;  . TUBAL LIGATION      There were no vitals filed for this visit.  Subjective Assessment - 06/03/19 1601    Subjective  Reports her L shoulder feels "pretty good". Reports compliance with HEP and very minimal pain in L shoulder with rest, and 2/10 pain with overhead motion.    Pertinent History  Pt is a 69 year old female s/p L reverse total shoulder 12/11/18. Reports she was immobilized in her sling for 2 weeks. After immobilization has been using passive motion machine for flexion. Has been doing AAROM flexion/abd/ER, bicep, shrugs, and forward punches with 1# with HHPT over the past week. MD Marlou Sa advised patient for out of sling, AAROM, PROM with no lifting of more than 5#  until next followup with him (mid Oct). Pt reports only 2/10 at rest, 6/10 with motion. Pain is at the medial upper arm and in the armpit. Pain is worse during the evening after moving it all day. Patient lives with her husband and her 4 dogs, which she walks frequently and cares for. She is R handed.Pt denies N/V, B&B changes, unexplained weight fluctuation, saddle paresthesia, fever, night sweats, or unrelenting night pain at this time.Pt denies N/V, B&B changes, unexplained weight fluctuation, saddle paresthesia, fever, night sweats, or unrelenting night pain at this time.    Limitations  House hold activities;Lifting    How long can you sit comfortably?  unlimited    How long can you stand comfortably?  unlimited    How long can you walk comfortably?  unlimited    Diagnostic tests  Xrays    Patient Stated Goals  Being able to complete daily tasks    Pain Onset  1 to 4 weeks ago         THEREX -UE bike 2 minutes each direction for gentle strengthening and warm-up - Lat stretch seated 2x 76mn hold - Curl to overhead press 2# DB bilat x8; 3# 2x 5 with difficulty maintaining scapualr retraction with 3# DB - ER at 90/90 walk backwards RTB 3x 5/6/7 rounds with cuing to maintain form with decent carry over -Standing  row 15# 3x10 with cueing initially to prevent excessive protraction on return with good carry over -Farmer's carry 8# 100 ft, 150 ft; with cueing for scapular retraction  -Standing TRX lat stretch 2x 30sec hold                           PT Education - 06/03/19 1607    Education Details  therex form    Person(s) Educated  Patient    Methods  Explanation;Demonstration;Verbal cues    Comprehension  Verbalized understanding;Returned demonstration;Verbal cues required       PT Short Term Goals - 05/07/19 1349      PT SHORT TERM GOAL #1   Title  Pt will be independent with HEP in order to improve strength and balance in order to improve function at home     Baseline  01/02/19; 05/07/2019 does them "most of the time"    Time  4    Period  Weeks    Status  Partially Met        PT Long Term Goals - 05/07/19 1350      PT LONG TERM GOAL #1   Title  Pt will decrease quick DASH score by at least 8% in order to demonstrate clinically significant reduction in disability.  Baseline  02/25/19 56.8%; 05/07/2019 40.9%    Time  8    Period  Weeks    Status  Achieved      PT LONG TERM GOAL #2   Title  Patient will demonstrate full AROM in L shoulder in order to complete household ADLs    Baseline  05/07/2019; flexion:130 ABD 115d IR S2 ER C8; 04/01/19 flex 121d abd 110d IR L PSIS ER C8    Time  8    Period  Weeks    Status  On-going      PT LONG TERM GOAL #3   Title  Pt will decrease worst pain as reported on NPRS by at least 3 points in order to demonstrate clinically significant reduction in pain.    Baseline  02/25/19 2/10 pain with movement; 04/01/19 3/10 with overhead motion; 05/07/2019 4/10 with ADLs    Time  8    Period  Weeks    Status  On-going      PT LONG TERM GOAL #4   Title  Pt will demonstrate gross periscapular strength of 4/5 in order to safely lift for heavy household chores    Baseline  05/07/2019; R/L shoulder flex 4+/4; ABD* 4/4; ER 5/5; IR 5/4; Pariscap Y: 3/3-gradual release from test position; T: 4/4-, I 5/4    Time  8    Period  Weeks    Status  Revised      PT LONG TERM GOAL #5   Title  Patient will be able to carry 30lb backpack 248f in order to demonstrate safety with taking her hunting equipment into the woods    Baseline  04/01/19 10# DB farmers carry with fatigue at 1096fand some increased pain; 05/07/2019 10# DB carry [id number] carry 100 ft at L shoulder) with some fatigue but not increase in pain    Time  6    Period  Weeks    Status  On-going      Additional Long Term Goals   Additional Long Term Goals  Yes      PT LONG TERM GOAL #6   Title  Pt will be able to bend over and pick up 15#  DB and carry 20 feet in order to be able to carry dogs outside and dog food into house    Baseline  05/07/2019; able to pick up 10# DB off the floor and walk 20 feet with medium difficulty and fatigue    Time  6    Period  Weeks    Status  New            Plan - 06/03/19 1630    Clinical Impression Statement  PT continued therex progression for strengthening, and scapulo-humeral rhythm with good success. Patient is motivated throughout session and able to comply with all cuing for technique.  PT will reassess progress next session to determine need for continued services/frequency.    Personal Factors and Comorbidities  Age;Sex;Behavior Pattern;Comorbidity 1;Fitness    Comorbidities  HTN    Examination-Activity Limitations  Dressing;Carry;Reach Overhead;Lift    Examination-Participation Restrictions  Driving;Community Activity;Cleaning    Stability/Clinical Decision Making  Evolving/Moderate complexity    Clinical Decision Making  Moderate    Rehab Potential  Good    PT Frequency  2x / week    PT Duration  8 weeks    PT Treatment/Interventions  ADLs/Self Care Home Management;Electrical Stimulation;Therapeutic activities;Passive range of motion;Manual techniques;Patient/family education;Therapeutic exercise;Iontophoresis 4mg/109mDexamethasone;Moist Heat;Traction;Cryotherapy;Ultrasound;Functional mobility training;Neuromuscular re-education;Balance training;Dry  needling;Joint Manipulations    PT Next Visit Plan  progress strength therex for goals    PT Home Exercise Plan  [email or web address]    Consulted and Agree with Plan of Care  Patient       Patient will benefit from skilled therapeutic intervention in order to improve the following deficits and impairments:  Decreased mobility, Decreased endurance, Pain, Postural dysfunction, Impaired UE functional use, Impaired flexibility, Increased fascial restricitons, Decreased strength, Decreased coordination, Decreased range of motion, Improper body  mechanics  Visit Diagnosis: Stiffness of left shoulder, not elsewhere classified  Muscle weakness (generalized)  Acute pain of left shoulder     Problem List Patient Active Problem List   Diagnosis Date Noted  . 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 Chronic left shoulder pain 08/23/2018  . Chronic diarrhea 12/07/2014  . Segmental colitis with rectal bleeding ([place]) 09/12/2014  . Routine general medical examination at a health care facility 06/05/2014  . Overweight 06/13/2011  . ABNORMAL CHEST XRAY 09/30/2007  . Hyperlipidemia 02/17/2007  . Essential hypertension 01/08/2007  . Osteoarthritis 01/08/2007   Shelton Silvas PT, DPT Shelton Silvas 06/03/2019, 4:42 PM  [facility] [address], [place], [address] Phone: [phone]   Fax:  [phone]  Name: [name] MRN: [id number] Date of Birth: [birth year]

## 2019-06-05 ENCOUNTER — Ambulatory Visit: Payer: Medicare Other | Admitting: Physical Therapy

## 2019-06-05 ENCOUNTER — Other Ambulatory Visit: Payer: Self-pay

## 2019-06-05 ENCOUNTER — Encounter: Payer: Self-pay | Admitting: Physical Therapy

## 2019-06-05 DIAGNOSIS — M6281 Muscle weakness (generalized): Secondary | ICD-10-CM | POA: Diagnosis not present

## 2019-06-05 DIAGNOSIS — M25512 Pain in left shoulder: Secondary | ICD-10-CM | POA: Diagnosis not present

## 2019-06-05 DIAGNOSIS — M25612 Stiffness of left shoulder, not elsewhere classified: Secondary | ICD-10-CM

## 2019-06-05 NOTE — Therapy (Signed)
[facility] [address], [place], [address] Phone: [phone]   Fax:  [phone]  Physical Therapy Treatment/Progress Note Reporting period 05/07/19 - 06/05/19  Patient Details  Name: [name] MRN: [id number] Date of Birth: 10/12/1950 No data recorded  Encounter Date: 06/05/2019  PT End of Session - 06/05/19 1442    Visit Number  43    Number of Visits  54    Date for PT Re-Evaluation  07/03/19    Authorization - Visit Number  9    Authorization - Number of Visits  10    PT Start Time  0145    PT Stop Time  0230    PT Time Calculation (min)  45 min    Activity Tolerance  Patient tolerated treatment well    Behavior During Therapy  [facility] for tasks assessed/performed       Past Medical History:  Diagnosis Date  . Arthritis    back, fingers with joint pain and swelling.  chronic back pain  . Chronic back pain    arthritis   . Diverticulosis   . Elevated cholesterol    takes Niacin daily and Simvastatin  . GERD (gastroesophageal reflux disease) 06/2004   non-specific gastritis on EGD 06/2004  . Headache(784.0)    occasionally  . History of colon polyps 2004, 2009   2004:adenomatous. 2009 hyperplastic.   Marland Kitchen History of hiatal hernia   . Hypertension    takes Tenoretic and Lisinopril daily  . Mucoid cyst of joint 08/2013   right index finger  . Osteopenia 01/2014   T score -1.1 FRAX 14%/0.5%. Stable from prior DEXA  . PONV (postoperative nausea and vomiting)   . Rotator cuff arthropathy    Left  . Seasonal allergies   . Stroke ([place]) 1998   x 2 - mild left-sided weakness  . Urge incontinence   . Uterine prolapse     Past Surgical History:  Procedure Laterality Date  . BACK SURGERY     x 2  . BELPHAROPTOSIS REPAIR Bilateral 12/14/2017  . CATARACT EXTRACTION    . CATARACT EXTRACTION Bilateral 10/2017  . COLONOSCOPY  2004, 2009, 2014  . FINGER SURGERY    . GUM SURGERY    . GYNECOLOGIC  CRYOSURGERY    . HYSTEROSCOPY WITH D & C  12/07/2010   with resection of endometrial polyp  . JOINT REPLACEMENT    . KNEE ARTHROSCOPY Left 2008  . lip biopsy     done at MD office Fri 11/22/13  . MASS EXCISION Right 08/15/2013   Procedure: RIGHT INDEX EXCISION MASS ;  Surgeon: Tennis Must, MD;  Location: [place];  Service: Orthopedics;  Laterality: Right;  . NASAL SEPTUM SURGERY    . OOPHORECTOMY Right 2000  . REVERSE SHOULDER ARTHROPLASTY Left 12/11/2018  . REVERSE SHOULDER ARTHROPLASTY Left 12/11/2018   Procedure: LEFT REVERSE SHOULDER ARTHROPLASTY;  Surgeon: Meredith Pel, MD;  Location: [place];  Service: Orthopedics;  Laterality: Left;  . SHOULDER ARTHROSCOPY WITH OPEN ROTATOR CUFF REPAIR AND DISTAL CLAVICLE ACROMINECTOMY Right 11/26/2013   Procedure: RIGHT SHOULDER ARTHROSCOPY WITH MINI OPEN ROTATOR CUFF REPAIR AND DISTAL CLAVICLE RESECTION, SUBACROMIAL DECOMPRESSION, POSSIBLE [facility] PATCH.;  Surgeon: [name], MD;  Location: [place];  Service: Orthopedics;  Laterality: Right;  . TOTAL KNEE ARTHROPLASTY Right 03/21/2017  . TOTAL KNEE ARTHROPLASTY Right 03/21/2017   Procedure: RIGHT TOTAL KNEE ARTHROPLASTY;  Surgeon: [name], MD;  Location: [place];  Service: Orthopedics;  Laterality: Right;  . TUBAL LIGATION      There were no vitals filed for this visit.  Subjective Assessment - 06/05/19 1347    Subjective  Patient reports no pain today, just soreness. She reports she can feed/bathe/dress herself but that these tasks are modified d/t decreased "behind the back motion". Patient reports she is completing her own cooking/cleaning and household chores, but is continuing to have difficulty lifting dinnerware and cans into overhead cabniets and taking things out of her oven which is shoulder level.    Pertinent History  Pt is a 69 year old female s/p L reverse total shoulder 12/11/18. Reports she was immobilized in her sling for 2 weeks. After  immobilization has been using passive motion machine for flexion. Has been doing AAROM flexion/abd/ER, bicep, shrugs, and forward punches with 1# with HHPT over the past week. MD Marlou Sa advised patient for out of sling, AAROM, PROM with no lifting of more than 5# until next followup with him (mid Oct). Pt reports only 2/10 at rest, 6/10 with motion. Pain is at the medial upper arm and in the armpit. Pain is worse during the evening after moving it all day. Patient lives with her husband and her 4 dogs, which she walks frequently and cares for. She is R handed.Pt denies N/V, B&B changes, unexplained weight fluctuation, saddle paresthesia, fever, night sweats, or unrelenting night pain at this time.Pt denies N/V, B&B changes, unexplained weight fluctuation, saddle paresthesia, fever, night sweats, or unrelenting night pain at this time.    Limitations  House hold activities;Lifting    How long can you stand comfortably?  unlimited    How long can you walk comfortably?  unlimited    Diagnostic tests  Xrays    Patient Stated Goals  Being able to complete daily tasks    Pain Onset  1 to 4 weeks ago         Ther-Ex UBE forward 29mn backward 248m MMT and ROM screen with PT educating patient on need for scapular depression strength to complete overhead motion with shoulder girdle strength, with education on anatomy involved with verbalized understanding Squat pick up 20# KB + 2031farry  100f36frmers carry with 35# backpack; 100ft71fh 20# backpack with min postural cuing with good carry over PT reviewed HEP with visual demonstration of previous therex and physical demonstration from patient, needing minor corrections of 3 new updates to therex. Educated patient on progress thus far and need for consistency for strength gains in overhead motion and carrying with patient verbalizing understanding. PT educated patient on frequency, rep/set range, hold time for strength vs. Stretching and how/when to increase  and increase intensity with weight and bands.  Exercises  Sleeper Stretch - 1-2min 67md - 3x daily - 7x weekly  Doorway Pec Stretch at 60 Degrees Abduction with Arm Straight - 1-2min h56m - 3x daily - 7x weekly  Standing Shoulder Flexion Stretch on Wall - 1-2min ho29m- 3x daily - 7x weekly  Seated Overhead Press with Dumbbells - 5-10 reps - 3 sets - 1x daily - 3x weekly  Standing Row with Anchored Resistance - 10 reps - 3 sets - 1x daily - 3x weekly  Scaption with Dumbbells - 10 reps - 3 sets - 1x daily - 3x weekly  Standing High Row with Resistance - 10 reps - 3 sets - 1x daily - 3x weekly  Standing Shoulder External Rotation with Resistance - 10 reps -  3 sets - 1x daily - 3x weekly                         PT Education - 06/05/19 1441    Education Details  therex form, HEP review, POC update    Person(s) Educated  Patient    Methods  Explanation;Demonstration;Verbal cues;Tactile cues;Handout    Comprehension  Verbalized understanding;Returned demonstration;Verbal cues required;Tactile cues required       PT Short Term Goals - 06/05/19 1350      PT SHORT TERM GOAL #1   Title  Pt will be independent with HEP in order to improve strength and balance in order to improve function at home    Baseline  01/02/19; 05/07/2019 does them "most of the time"; 06/05/19 completing regularly    Time  4    Period  Weeks    Status  Partially Met        PT Long Term Goals - 06/05/19 1351      PT LONG TERM GOAL #1   Title  Pt will decrease quick DASH score by at least 8% in order to demonstrate clinically significant reduction in disability.    Baseline  02/25/19 56.8%; 05/07/2019 40.9%    Time  8    Period  Weeks    Status  Achieved      PT LONG TERM GOAL #2   Title  Patient will demonstrate full AROM in L shoulder in order to complete household ADLs    Baseline  05/07/2019; flexion:130 ABD 115d IR S2 ER C8; 04/01/19 flex 121d abd 110d IR L PSIS ER C8; 06/05/19 flex 136d abd  118d IR L2 ER C8    Time  8    Period  Weeks    Status  On-going      PT LONG TERM GOAL #3   Title  Pt will decrease worst pain as reported on NPRS by at least 3 points in order to demonstrate clinically significant reduction in pain.    Baseline  02/25/19 2/10 pain with movement; 04/01/19 3/10 with overhead motion; 05/07/2019 4/10 with ADLs; 06/05/19 No pain just soreness    Time  8    Period  Weeks    Status  Achieved      PT LONG TERM GOAL #4   Title  Pt will demonstrate gross periscapular strength of 4/5 in order to safely lift for heavy household chores    Baseline  05/07/2019; R/L shoulder flex 4+/4; ABD* 4/4; ER 5/5; IR 5/4; Pariscap Y: 3/3-gradual release from test position; T: 4/4-, I 5/4; 06/05/19 R/L shoulder flex 5/5; ABD 4+/4+; ER 5/4+; IR 5/4; Pariscap Y: 3+/3-gradual release from test position; T: 4+/4+-, I 5/4+ Latissimus    Time  8    Period  Weeks    Status  Partially Met      PT LONG TERM GOAL #5   Title  Patient will be able to carry 20lb backpack 285f in order to demonstrate safety with taking her hunting equipment into the woods    Baseline  04/01/19 10# DB farmers carry with fatigue at 1060fand some increased pain; 05/07/2019 10# DB carry [id number] carry 100 ft at L shoulder) with some fatigue but not increase in pain; Able to complete 100ft65fh 35# and 100ft 36f 20#    Time  6    Period  Weeks    Status  Achieved      PT LONG  TERM GOAL #6   Title  Pt will be able to bend over and pick up 15# DB and carry 20 feet in order to be able to carry dogs outside and dog food into house    Baseline  05/07/2019; able to pick up 10# DB off the floor and walk 20 feet with medium difficulty and fatigue; 06/05/19 20# carry 52f with fatigue, and moderate difficulty    Time  6    Period  Weeks    Status  On-going            Plan - 06/05/19 1443    Clinical Impression Statement  PT reassessed goals this session, where patient is making strong progress.  Patient has greatly improved in shoudler strength, and motion, with continued deficiets in overhead strength, and flexion and IR ROM. Patient continues to have weakness in scapular depressors, which is evident in weakness with reaching and lifting past 90d. Patient is able to complete heavy carrying to demonstrate carrying her dog, but she is not very confident and fatigues quickly. PT and patient discussed re-cert for 4 weeks 1x/week with patient improving her ability to be diligent with HEP to increase independence to work toward discharge.PT will continue progression to address these impairments as able.    Personal Factors and Comorbidities  Age;Sex;Behavior Pattern;Comorbidity 1;Fitness    Comorbidities  HTN    Examination-Activity Limitations  Dressing;Carry;Reach Overhead;Lift    Examination-Participation Restrictions  Driving;Community Activity;Cleaning    Stability/Clinical Decision Making  Evolving/Moderate complexity    Clinical Decision Making  Moderate    Rehab Potential  Good    PT Frequency  2x / week    PT Duration  8 weeks    PT Treatment/Interventions  ADLs/Self Care Home Management;Electrical Stimulation;Therapeutic activities;Passive range of motion;Manual techniques;Patient/family education;Therapeutic exercise;Iontophoresis 460mml Dexamethasone;Moist Heat;Traction;Cryotherapy;Ultrasound;Functional mobility training;Neuromuscular re-education;Balance training;Dry needling;Joint Manipulations    PT Next Visit Plan  progress strength therex for goals    PT Home Exercise Plan  [id number]  Consulted and Agree with Plan of Care  Patient       Patient will benefit from skilled therapeutic intervention in order to improve the following deficits and impairments:  Decreased mobility, Decreased endurance, Pain, Postural dysfunction, Impaired UE functional use, Impaired flexibility, Increased fascial restricitons, Decreased strength, Decreased coordination, Decreased range of motion,  Improper body mechanics  Visit Diagnosis: Stiffness of left shoulder, not elsewhere classified  Muscle weakness (generalized)  Acute pain of left shoulder     Problem List Patient Active Problem List   Diagnosis Date Noted  . 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 Chronic left shoulder pain 08/23/2018  . Chronic diarrhea 12/07/2014  . Segmental colitis with rectal bleeding ([place]/06/2014  . Routine general medical examination at a health care facility 06/05/2014  . Overweight 06/13/2011  . ABNORMAL CHEST XRAY 09/30/2007  . Hyperlipidemia 02/17/2007  . Essential hypertension 01/08/2007  . Osteoarthritis 01/08/2007   ChShelton SilvasT, DPT ChShelton Silvas/24/2021, 2:53 PM  [facility] [facility] [address]hone: [phone] Fax:  [phone]: [name]: [id number] of Birth: 3/Aug 14, 1950

## 2019-06-12 ENCOUNTER — Ambulatory Visit: Payer: Medicare Other | Attending: Orthopedic Surgery | Admitting: Physical Therapy

## 2019-06-12 ENCOUNTER — Encounter: Payer: Self-pay | Admitting: Physical Therapy

## 2019-06-12 ENCOUNTER — Other Ambulatory Visit: Payer: Self-pay

## 2019-06-12 DIAGNOSIS — M25612 Stiffness of left shoulder, not elsewhere classified: Secondary | ICD-10-CM | POA: Insufficient documentation

## 2019-06-12 DIAGNOSIS — M6281 Muscle weakness (generalized): Secondary | ICD-10-CM | POA: Diagnosis not present

## 2019-06-12 DIAGNOSIS — M25512 Pain in left shoulder: Secondary | ICD-10-CM | POA: Diagnosis not present

## 2019-06-12 DIAGNOSIS — R262 Difficulty in walking, not elsewhere classified: Secondary | ICD-10-CM | POA: Diagnosis not present

## 2019-06-12 NOTE — Therapy (Signed)
[place] [facility] [address], [place], [address] Phone: [phone]   Fax:  [phone]  Physical Therapy Treatment/Progress Note Reporting period 06/05/19-06/12/19  Patient Details  Name: [name] MRN: [id number] Date of Birth: 1951/02/22 No data recorded  Encounter Date: 06/12/2019  PT End of Session - 06/12/19 0918    Visit Number  37    Number of Visits  10    Date for PT Re-Evaluation  07/03/19    Authorization Type  medicare    Authorization - Visit Number  10    Authorization - Number of Visits  10    PT Start Time  0903    PT Stop Time  0942    PT Time Calculation (min)  39 min    Activity Tolerance  Patient tolerated treatment well    Behavior During Therapy  [facility] for tasks assessed/performed       Past Medical History:  Diagnosis Date  . Arthritis    back, fingers with joint pain and swelling.  chronic back pain  . Chronic back pain    arthritis   . Diverticulosis   . Elevated cholesterol    takes Niacin daily and Simvastatin  . GERD (gastroesophageal reflux disease) 06/2004   non-specific gastritis on EGD 06/2004  . Headache(784.0)    occasionally  . History of colon polyps 2004, 2009   2004:adenomatous. 2009 hyperplastic.   Marland Kitchen History of hiatal hernia   . Hypertension    takes Tenoretic and Lisinopril daily  . Mucoid cyst of joint 08/2013   right index finger  . Osteopenia 01/2014   T score -1.1 FRAX 14%/0.5%. Stable from prior DEXA  . PONV (postoperative nausea and vomiting)   . Rotator cuff arthropathy    Left  . Seasonal allergies   . Stroke ([place]) 1998   x 2 - mild left-sided weakness  . Urge incontinence   . Uterine prolapse     Past Surgical History:  Procedure Laterality Date  . BACK SURGERY     x 2  . BELPHAROPTOSIS REPAIR Bilateral 12/14/2017  . CATARACT EXTRACTION    . CATARACT EXTRACTION Bilateral 10/2017  . COLONOSCOPY  2004, 2009, 2014  . FINGER SURGERY    .  GUM SURGERY    . GYNECOLOGIC CRYOSURGERY    . HYSTEROSCOPY WITH D & C  12/07/2010   with resection of endometrial polyp  . JOINT REPLACEMENT    . KNEE ARTHROSCOPY Left 2008  . lip biopsy     done at MD office Fri 11/22/13  . MASS EXCISION Right 08/15/2013   Procedure: RIGHT INDEX EXCISION MASS ;  Surgeon: Tennis Must, MD;  Location: [place];  Service: Orthopedics;  Laterality: Right;  . NASAL SEPTUM SURGERY    . OOPHORECTOMY Right 2000  . REVERSE SHOULDER ARTHROPLASTY Left 12/11/2018  . REVERSE SHOULDER ARTHROPLASTY Left 12/11/2018   Procedure: LEFT REVERSE SHOULDER ARTHROPLASTY;  Surgeon: Meredith Pel, MD;  Location: [place];  Service: Orthopedics;  Laterality: Left;  . SHOULDER ARTHROSCOPY WITH OPEN ROTATOR CUFF REPAIR AND DISTAL CLAVICLE ACROMINECTOMY Right 11/26/2013   Procedure: RIGHT SHOULDER ARTHROSCOPY WITH MINI OPEN ROTATOR CUFF REPAIR AND DISTAL CLAVICLE RESECTION, SUBACROMIAL DECOMPRESSION, POSSIBLE [facility] PATCH.;  Surgeon: [name], MD;  Location: [place];  Service: Orthopedics;  Laterality: Right;  . TOTAL KNEE ARTHROPLASTY Right 03/21/2017  . TOTAL KNEE ARTHROPLASTY Right 03/21/2017   Procedure: RIGHT TOTAL KNEE ARTHROPLASTY;  Surgeon:  [name], MD;  Location: [place];  Service: Orthopedics;  Laterality: Right;  . TUBAL LIGATION      There were no vitals filed for this visit.  Subjective Assessment - 06/12/19 0911    Subjective  Patient reports she is continuing to feel better overall. Sore following last session. Reports 3/10 pain today.    Pertinent History  Pt is a 69 year old female s/p L reverse total shoulder 12/11/18. Reports she was immobilized in her sling for 2 weeks. After immobilization has been using passive motion machine for flexion. Has been doing AAROM flexion/abd/ER, bicep, shrugs, and forward punches with 1# with HHPT over the past week. MD Marlou Sa advised patient for out of sling, AAROM, PROM with no lifting of more than 5#  until next followup with him (mid Oct). Pt reports only 2/10 at rest, 6/10 with motion. Pain is at the medial upper arm and in the armpit. Pain is worse during the evening after moving it all day. Patient lives with her husband and her 4 dogs, which she walks frequently and cares for. She is R handed.Pt denies N/V, B&B changes, unexplained weight fluctuation, saddle paresthesia, fever, night sweats, or unrelenting night pain at this time.Pt denies N/V, B&B changes, unexplained weight fluctuation, saddle paresthesia, fever, night sweats, or unrelenting night pain at this time.    Limitations  House hold activities;Lifting    How long can you sit comfortably?  unlimited    How long can you stand comfortably?  unlimited    How long can you walk comfortably?  unlimited    Diagnostic tests  Xrays    Patient Stated Goals  Being able to complete daily tasks    Pain Onset  1 to 4 weeks ago       THEREX -UE bike 2 minutes each direction for gentle strengthening and warm-up - Dowel thoracic rotations x20 3-5sec with dowel on back and BUE in ER - Seated BUE dowel flex over 1/2 foam roll x15 3-5sec hold - High rows 10# x12; 15# 2x 12 with good carry over of previous session cuing -Farmer's carry 8# 2x 150 ft; with min cueing for scapular retraction with good carry over - [employer] press 2# x12 with difficulty maintaining technique d/t fatigue for final reps; 1# 2x 12 with better success for proper technique - Scaption 2# 3x 10 with min cuing for increased motion with good carry over                         PT Education - 06/12/19 0913    Education Details  therex form    Person(s) Educated  Patient    Methods  Explanation;Demonstration;Tactile cues;Verbal cues    Comprehension  Verbalized understanding;Returned demonstration;Verbal cues required;Tactile cues required       PT Short Term Goals - 06/05/19 1350      PT SHORT TERM GOAL #1   Title  Pt will be independent with HEP  in order to improve strength and balance in order to improve function at home    Baseline  01/02/19; 05/07/2019 does them "most of the time"; 06/05/19 completing regularly    Time  4    Period  Weeks    Status  Partially Met        PT Long Term Goals - 06/05/19 1351      PT LONG TERM GOAL #1   Title  Pt will decrease quick DASH score by at least  8% in order to demonstrate clinically significant reduction in disability.    Baseline  02/25/19 56.8%; 05/07/2019 40.9%    Time  8    Period  Weeks    Status  Achieved      PT LONG TERM GOAL #2   Title  Patient will demonstrate full AROM in L shoulder in order to complete household ADLs    Baseline  05/07/2019; flexion:130 ABD 115d IR S2 ER C8; 04/01/19 flex 121d abd 110d IR L PSIS ER C8; 06/05/19 flex 136d abd 118d IR L2 ER C8    Time  8    Period  Weeks    Status  On-going      PT LONG TERM GOAL #3   Title  Pt will decrease worst pain as reported on NPRS by at least 3 points in order to demonstrate clinically significant reduction in pain.    Baseline  02/25/19 2/10 pain with movement; 04/01/19 3/10 with overhead motion; 05/07/2019 4/10 with ADLs; 06/05/19 No pain just soreness    Time  8    Period  Weeks    Status  Achieved      PT LONG TERM GOAL #4   Title  Pt will demonstrate gross periscapular strength of 4/5 in order to safely lift for heavy household chores    Baseline  05/07/2019; R/L shoulder flex 4+/4; ABD* 4/4; ER 5/5; IR 5/4; Pariscap Y: 3/3-gradual release from test position; T: 4/4-, I 5/4; 06/05/19 R/L shoulder flex 5/5; ABD 4+/4+; ER 5/4+; IR 5/4; Pariscap Y: 3+/3-gradual release from test position; T: 4+/4+-, I 5/4+ Latissimus    Time  8    Period  Weeks    Status  Partially Met      PT LONG TERM GOAL #5   Title  Patient will be able to carry 20lb backpack 224f in order to demonstrate safety with taking her hunting equipment into the woods    Baseline  04/01/19 10# DB farmers carry with fatigue at 1071fand some  increased pain; 05/07/2019 10# DB carry [id number] carry 100 ft at L shoulder) with some fatigue but not increase in pain; Able to complete 100ft59fh 35# and 100ft 77f 20#    Time  6    Period  Weeks    Status  Achieved      PT LONG TERM GOAL #6   Title  Pt will be able to bend over and pick up 15# DB and carry 20 feet in order to be able to carry dogs outside and dog food into house    Baseline  05/07/2019; able to pick up 10# DB off the floor and walk 20 feet with medium difficulty and fatigue; 06/05/19 20# carry 20ft w74ffatigue, and moderate difficulty    Time  6    Period  Weeks    Status  On-going            Plan - 06/12/19 0936    Clinical Impression Statement  PT continued therex progression for scapular strength with introduction of endurance for scapular stabilizers to accomplish goal of walking with 15-20lbs. Patient is able to complete progression with good motivation and carry over of all cuing given for proper technique. PT will continue progression as able.    Personal Factors and Comorbidities  Age;Sex;Behavior Pattern;Comorbidity 1;Fitness    Comorbidities  HTN    Examination-Activity Limitations  Dressing;Carry;Reach Overhead;Lift    Examination-Participation Restrictions  Driving;Community Activity;Cleaning    Stability/Clinical Decision Making  Evolving/Moderate complexity    Clinical Decision Making  Moderate    Rehab Potential  Good    PT Frequency  2x / week    PT Duration  8 weeks    PT Treatment/Interventions  ADLs/Self Care Home Management;Electrical Stimulation;Therapeutic activities;Passive range of motion;Manual techniques;Patient/family education;Therapeutic exercise;Iontophoresis 86m/ml Dexamethasone;Moist Heat;Traction;Cryotherapy;Ultrasound;Functional mobility training;Neuromuscular re-education;Balance training;Dry needling;Joint Manipulations    PT Next Visit Plan  progress strength therex for goals    PT Home Exercise Plan  [id number]    Consulted and Agree with Plan of Care  Patient       Patient will benefit from skilled therapeutic intervention in order to improve the following deficits and impairments:  Decreased mobility, Decreased endurance, Pain, Postural dysfunction, Impaired UE functional use, Impaired flexibility, Increased fascial restricitons, Decreased strength, Decreased coordination, Decreased range of motion, Improper body mechanics  Visit Diagnosis: Stiffness of left shoulder, not elsewhere classified  Muscle weakness (generalized)  Acute pain of left shoulder     Problem List Patient Active Problem List   Diagnosis Date Noted  . 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 Chronic left shoulder pain 08/23/2018  . Chronic diarrhea 12/07/2014  . Segmental colitis with rectal bleeding ([place] 09/12/2014  . Routine general medical examination at a health care facility 06/05/2014  . Overweight 06/13/2011  . ABNORMAL CHEST XRAY 09/30/2007  . Hyperlipidemia 02/17/2007  . Essential hypertension 01/08/2007  . Osteoarthritis 01/08/2007   CShelton SilvasPT, DPT CShelton Silvas3/06/2019, 10:35 AM  [facility] [address] [place] [address]Phone: [phone]  Fax:  [phone] Name: Stacy ADCOXMRN: [id number] of Birth: [birth year]

## 2019-06-18 ENCOUNTER — Encounter: Payer: Self-pay | Admitting: Physical Therapy

## 2019-06-18 ENCOUNTER — Ambulatory Visit: Payer: Medicare Other | Attending: Internal Medicine

## 2019-06-18 ENCOUNTER — Other Ambulatory Visit: Payer: Self-pay

## 2019-06-18 ENCOUNTER — Ambulatory Visit: Payer: Medicare Other | Admitting: Physical Therapy

## 2019-06-18 DIAGNOSIS — M25512 Pain in left shoulder: Secondary | ICD-10-CM | POA: Diagnosis not present

## 2019-06-18 DIAGNOSIS — R262 Difficulty in walking, not elsewhere classified: Secondary | ICD-10-CM | POA: Diagnosis not present

## 2019-06-18 DIAGNOSIS — M6281 Muscle weakness (generalized): Secondary | ICD-10-CM | POA: Diagnosis not present

## 2019-06-18 DIAGNOSIS — Z23 Encounter for immunization: Secondary | ICD-10-CM | POA: Insufficient documentation

## 2019-06-18 DIAGNOSIS — M25612 Stiffness of left shoulder, not elsewhere classified: Secondary | ICD-10-CM

## 2019-06-18 NOTE — Progress Notes (Signed)
   Covid-19 Vaccination Clinic  Name:  [name]    MRN: [id number] DOB: [birth year]  06/18/2019  [name] was observed post Covid-19 immunization for 15 minutes without incident. She was provided with Vaccine Information Sheet and instruction to access the V-Safe system.   [name] was instructed to call 911 with any severe reactions post vaccine: Marland Kitchen Difficulty breathing  . Swelling of face and throat  . A fast heartbeat  . A bad rash all over body  . Dizziness and weakness   Immunizations Administered    Name Date Dose VIS Date Route   Pfizer COVID-19 Vaccine 06/18/2019  4:27 PM 0.3 mL 03/22/2019 Intramuscular   Manufacturer: [place]   Lot: [id number]   Mayer: [id number]

## 2019-06-18 NOTE — Therapy (Signed)
[facility] [address] [place], [address] Phone: [phone]   Fax:  [phone]  Physical Therapy Treatment  Patient Details  Name: [name] MRN: [id number] Date of Birth: November 09, 1950 No data recorded  Encounter Date: 06/18/2019  PT End of Session - 06/18/19 0952    Visit Number  48    Number of Visits  71    Date for PT Re-Evaluation  07/03/19    Authorization Type  medicare    Authorization - Visit Number  1    Authorization - Number of Visits  10    PT Start Time  [phone]    PT Stop Time  1027    PT Time Calculation (min)  39 min    Activity Tolerance  Patient tolerated treatment well    Behavior During Therapy  [facility] for tasks assessed/performed       Past Medical History:  Diagnosis Date  . Arthritis    back, fingers with joint pain and swelling.  chronic back pain  . Chronic back pain    arthritis   . Diverticulosis   . Elevated cholesterol    takes Niacin daily and Simvastatin  . GERD (gastroesophageal reflux disease) 06/2004   non-specific gastritis on EGD 06/2004  . Headache(784.0)    occasionally  . History of colon polyps 2004, 2009   2004:adenomatous. 2009 hyperplastic.   Marland Kitchen History of hiatal hernia   . Hypertension    takes Tenoretic and Lisinopril daily  . Mucoid cyst of joint 08/2013   right index finger  . Osteopenia 01/2014   T score -1.1 FRAX 14%/0.5%. Stable from prior DEXA  . PONV (postoperative nausea and vomiting)   . Rotator cuff arthropathy    Left  . Seasonal allergies   . Stroke ([place]) 1998   x 2 - mild left-sided weakness  . Urge incontinence   . Uterine prolapse     Past Surgical History:  Procedure Laterality Date  . BACK SURGERY     x 2  . BELPHAROPTOSIS REPAIR Bilateral 12/14/2017  . CATARACT EXTRACTION    . CATARACT EXTRACTION Bilateral 10/2017  . COLONOSCOPY  2004, 2009, 2014  . FINGER SURGERY    . GUM SURGERY    . GYNECOLOGIC CRYOSURGERY    .  HYSTEROSCOPY WITH D & C  12/07/2010   with resection of endometrial polyp  . JOINT REPLACEMENT    . KNEE ARTHROSCOPY Left 2008  . lip biopsy     done at MD office Fri 11/22/13  . MASS EXCISION Right 08/15/2013   Procedure: RIGHT INDEX EXCISION MASS ;  Surgeon: Tennis Must, MD;  Location: [place];  Service: Orthopedics;  Laterality: Right;  . NASAL SEPTUM SURGERY    . OOPHORECTOMY Right 2000  . REVERSE SHOULDER ARTHROPLASTY Left 12/11/2018  . REVERSE SHOULDER ARTHROPLASTY Left 12/11/2018   Procedure: LEFT REVERSE SHOULDER ARTHROPLASTY;  Surgeon: Meredith Pel, MD;  Location: [place];  Service: Orthopedics;  Laterality: Left;  . SHOULDER ARTHROSCOPY WITH OPEN ROTATOR CUFF REPAIR AND DISTAL CLAVICLE ACROMINECTOMY Right 11/26/2013   Procedure: RIGHT SHOULDER ARTHROSCOPY WITH MINI OPEN ROTATOR CUFF REPAIR AND DISTAL CLAVICLE RESECTION, SUBACROMIAL DECOMPRESSION, POSSIBLE [facility] PATCH.;  Surgeon: [name], MD;  Location: [place];  Service: Orthopedics;  Laterality: Right;  . TOTAL KNEE ARTHROPLASTY Right 03/21/2017  . TOTAL KNEE ARTHROPLASTY Right 03/21/2017   Procedure: RIGHT TOTAL KNEE ARTHROPLASTY;  Surgeon: [name], MD;  Location: [place];  Service: Orthopedics;  Laterality: Right;  . TUBAL LIGATION      There were no vitals filed for this visit.  Subjective Assessment - 06/18/19 0951    Subjective  Patient reports minimal pain today, that she is not having throbbing pain, mostly just soreness. She is having difficulty "finding time" for HEP.    Pertinent History  Pt is a 69 year old female s/p L reverse total shoulder 12/11/18. Reports she was immobilized in her sling for 2 weeks. After immobilization has been using passive motion machine for flexion. Has been doing AAROM flexion/abd/ER, bicep, shrugs, and forward punches with 1# with HHPT over the past week. MD Marlou Sa advised patient for out of sling, AAROM, PROM with no lifting of more than 5# until next  followup with him (mid Oct). Pt reports only 2/10 at rest, 6/10 with motion. Pain is at the medial upper arm and in the armpit. Pain is worse during the evening after moving it all day. Patient lives with her husband and her 4 dogs, which she walks frequently and cares for. She is R handed.Pt denies N/V, B&B changes, unexplained weight fluctuation, saddle paresthesia, fever, night sweats, or unrelenting night pain at this time.Pt denies N/V, B&B changes, unexplained weight fluctuation, saddle paresthesia, fever, night sweats, or unrelenting night pain at this time.    Limitations  House hold activities;Lifting    How long can you sit comfortably?  unlimited    How long can you stand comfortably?  unlimited    How long can you walk comfortably?  unlimited    Diagnostic tests  Xrays    Patient Stated Goals  Being able to complete daily tasks    Pain Onset  1 to 4 weeks ago         [place] -UE bike 2 minutes each direction for gentle strengthening and warm-up - Dowel thoracic rotations x20 3-5sec with dowel on back and BUE in ER - Seated BUE dowel flex over 1/2 foam roll x15 3-5sec hold -Farmer's carry 8# 2x 200 ft; with min cueing for scapular retraction with good carry over - Seated chest press 15# 3x 6 with cuing to maintain scapular retraction/contact with chair; difficult resistance for patient - High rows  15# 3x 12 with good carry over of previous session cuing - Military press  1# 3x 12 with better ability to maintain scapular retraction  - [place] 2# 2x 12 with min shoulder hiking                       PT Education - 06/18/19 0952    Education Details  therex form    Person(s) Educated  Patient    Methods  Explanation;Demonstration;Verbal cues    Comprehension  Verbalized understanding;Returned demonstration;Verbal cues required       PT Short Term Goals - 06/05/19 1350      PT SHORT TERM GOAL #1   Title  Pt will be independent with HEP in order to improve  strength and balance in order to improve function at home    Baseline  01/02/19; 05/07/2019 does them "most of the time"; 06/05/19 completing regularly    Time  4    Period  Weeks    Status  Partially Met        PT Long Term Goals - 06/05/19 1351      PT LONG TERM GOAL #1   Title  Pt will decrease quick DASH score by  at least 8% in order to demonstrate clinically significant reduction in disability.    Baseline  02/25/19 56.8%; 05/07/2019 40.9%    Time  8    Period  Weeks    Status  Achieved      PT LONG TERM GOAL #2   Title  Patient will demonstrate full AROM in L shoulder in order to complete household ADLs    Baseline  05/07/2019; flexion:130 ABD 115d IR S2 ER C8; 04/01/19 flex 121d abd 110d IR L PSIS ER C8; 06/05/19 flex 136d abd 118d IR L2 ER C8    Time  8    Period  Weeks    Status  On-going      PT LONG TERM GOAL #3   Title  Pt will decrease worst pain as reported on NPRS by at least 3 points in order to demonstrate clinically significant reduction in pain.    Baseline  02/25/19 2/10 pain with movement; 04/01/19 3/10 with overhead motion; 05/07/2019 4/10 with ADLs; 06/05/19 No pain just soreness    Time  8    Period  Weeks    Status  Achieved      PT LONG TERM GOAL #4   Title  Pt will demonstrate gross periscapular strength of 4/5 in order to safely lift for heavy household chores    Baseline  05/07/2019; R/L shoulder flex 4+/4; ABD* 4/4; ER 5/5; IR 5/4; Pariscap Y: 3/3-gradual release from test position; T: 4/4-, I 5/4; 06/05/19 R/L shoulder flex 5/5; ABD 4+/4+; ER 5/4+; IR 5/4; Pariscap Y: 3+/3-gradual release from test position; T: 4+/4+-, I 5/4+ Latissimus    Time  8    Period  Weeks    Status  Partially Met      PT LONG TERM GOAL #5   Title  Patient will be able to carry 20lb backpack 228f in order to demonstrate safety with taking her hunting equipment into the woods    Baseline  04/01/19 10# DB farmers carry with fatigue at 1073fand some increased pain; 05/07/2019  10# DB carry [id number] carry 100 ft at L shoulder) with some fatigue but not increase in pain; Able to complete 100ft33fh 35# and 100ft 75f 20#    Time  6    Period  Weeks    Status  Achieved      PT LONG TERM GOAL #6   Title  Pt will be able to bend over and pick up 15# DB and carry 20 feet in order to be able to carry dogs outside and dog food into house    Baseline  05/07/2019; able to pick up 10# DB off the floor and walk 20 feet with medium difficulty and fatigue; 06/05/19 20# carry 20ft w67ffatigue, and moderate difficulty    Time  6    Period  Weeks    Status  On-going            Plan - 06/18/19 0956   3893nical Impression Statement  PT continued therex progression for scapulo-humeral rhythm and scapular retraction endurance with good success. Patient is able to complete progression with good compliance of cuing for technique. PT will continue progression as able    Personal Factors and Comorbidities  Age;Sex;Behavior Pattern;Comorbidity 1;Fitness    Comorbidities  HTN    Examination-Activity Limitations  Dressing;Carry;Reach Overhead;Lift    Examination-Participation Restrictions  Driving;Community Activity;Cleaning    Stability/Clinical Decision Making  Evolving/Moderate complexity    Clinical Decision Making  Moderate  Rehab Potential  Good    PT Frequency  2x / week    PT Duration  8 weeks    PT Treatment/Interventions  ADLs/Self Care Home Management;Electrical Stimulation;Therapeutic activities;Passive range of motion;Manual techniques;Patient/family education;Therapeutic exercise;Iontophoresis 48m/ml Dexamethasone;Moist Heat;Traction;Cryotherapy;Ultrasound;Functional mobility training;Neuromuscular re-education;Balance training;Dry needling;Joint Manipulations    PT Next Visit Plan  progress strength therex for goals    PT Home Exercise Plan  [id number]   Consulted and Agree with Plan of Care  Patient       Patient will benefit from skilled therapeutic  intervention in order to improve the following deficits and impairments:  Decreased mobility, Decreased endurance, Pain, Postural dysfunction, Impaired UE functional use, Impaired flexibility, Increased fascial restricitons, Decreased strength, Decreased coordination, Decreased range of motion, Improper body mechanics  Visit Diagnosis: Stiffness of left shoulder, not elsewhere classified  Muscle weakness (generalized)  Acute pain of left shoulder     Problem List Patient Active Problem List   Diagnosis Date Noted  . 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 Chronic left shoulder pain 08/23/2018  . Chronic diarrhea 12/07/2014  . Segmental colitis with rectal bleeding ([place] 09/12/2014  . Routine general medical examination at a health care facility 06/05/2014  . Overweight 06/13/2011  . ABNORMAL CHEST XRAY 09/30/2007  . Hyperlipidemia 02/17/2007  . Essential hypertension 01/08/2007  . Osteoarthritis 01/08/2007   CShelton SilvasPT, DPT CShelton Silvas3/12/2019, 10:19 AM  [facility] [address] [place] [address]Phone: [phone]  Fax:  [phone] Name: NCNIYAH SPROULLMRN: [id number] of Birth: 303-26-1952

## 2019-06-24 ENCOUNTER — Other Ambulatory Visit: Payer: Self-pay | Admitting: Surgical

## 2019-06-24 ENCOUNTER — Encounter: Payer: Self-pay | Admitting: Orthopedic Surgery

## 2019-06-24 DIAGNOSIS — G5602 Carpal tunnel syndrome, left upper limb: Secondary | ICD-10-CM | POA: Diagnosis not present

## 2019-06-24 HISTORY — PX: CARPAL TUNNEL RELEASE: SHX101

## 2019-06-24 MED ORDER — OXYCODONE-ACETAMINOPHEN 5-325 MG PO TABS: 1.0000 | ORAL_TABLET | Freq: Four times a day (QID) | ORAL | 0 refills | Status: DC | PRN

## 2019-06-24 MED ORDER — METHOCARBAMOL 500 MG PO TABS: ORAL_TABLET | ORAL | 0 refills | Status: DC

## 2019-06-26 ENCOUNTER — Other Ambulatory Visit: Payer: Self-pay

## 2019-06-26 ENCOUNTER — Encounter: Payer: Self-pay | Admitting: Physical Therapy

## 2019-06-26 ENCOUNTER — Ambulatory Visit: Payer: Medicare Other | Admitting: Physical Therapy

## 2019-06-26 DIAGNOSIS — M25612 Stiffness of left shoulder, not elsewhere classified: Secondary | ICD-10-CM | POA: Diagnosis not present

## 2019-06-26 DIAGNOSIS — M25512 Pain in left shoulder: Secondary | ICD-10-CM

## 2019-06-26 DIAGNOSIS — M6281 Muscle weakness (generalized): Secondary | ICD-10-CM

## 2019-06-26 DIAGNOSIS — R262 Difficulty in walking, not elsewhere classified: Secondary | ICD-10-CM | POA: Diagnosis not present

## 2019-06-26 NOTE — Therapy (Signed)
[facility] [address] [place], [address] Phone: [phone]   Fax:  [phone]  Physical Therapy Treatment  Patient Details  Name: [name] MRN: [id number] Date of Birth: 04-24-1950 No data recorded  Encounter Date: 06/26/2019  PT End of Session - 06/26/19 1006    Visit Number  29    Number of Visits  70    Date for PT Re-Evaluation  07/03/19    Authorization Type  medicare    Authorization - Visit Number  2    Authorization - Number of Visits  10    PT Start Time  [phone]    PT Stop Time  1025    PT Time Calculation (min)  38 min    Activity Tolerance  Patient tolerated treatment well    Behavior During Therapy  [facility] for tasks assessed/performed       Past Medical History:  Diagnosis Date  . Arthritis    back, fingers with joint pain and swelling.  chronic back pain  . Chronic back pain    arthritis   . Diverticulosis   . Elevated cholesterol    takes Niacin daily and Simvastatin  . GERD (gastroesophageal reflux disease) 06/2004   non-specific gastritis on EGD 06/2004  . Headache(784.0)    occasionally  . History of colon polyps 2004, 2009   2004:adenomatous. 2009 hyperplastic.   Marland Kitchen History of hiatal hernia   . Hypertension    takes Tenoretic and Lisinopril daily  . Mucoid cyst of joint 08/2013   right index finger  . Osteopenia 01/2014   T score -1.1 FRAX 14%/0.5%. Stable from prior DEXA  . PONV (postoperative nausea and vomiting)   . Rotator cuff arthropathy    Left  . Seasonal allergies   . Stroke ([place]) 1998   x 2 - mild left-sided weakness  . Urge incontinence   . Uterine prolapse     Past Surgical History:  Procedure Laterality Date  . BACK SURGERY     x 2  . BELPHAROPTOSIS REPAIR Bilateral 12/14/2017  . CATARACT EXTRACTION    . CATARACT EXTRACTION Bilateral 10/2017  . COLONOSCOPY  2004, 2009, 2014  . FINGER SURGERY    . GUM SURGERY    . GYNECOLOGIC CRYOSURGERY     . HYSTEROSCOPY WITH D & C  12/07/2010   with resection of endometrial polyp  . JOINT REPLACEMENT    . KNEE ARTHROSCOPY Left 2008  . lip biopsy     done at MD office Fri 11/22/13  . MASS EXCISION Right 08/15/2013   Procedure: RIGHT INDEX EXCISION MASS ;  Surgeon: Tennis Must, MD;  Location: [facility];  Service: Orthopedics;  Laterality: Right;  . NASAL SEPTUM SURGERY    . OOPHORECTOMY Right 2000  . REVERSE SHOULDER ARTHROPLASTY Left 12/11/2018  . REVERSE SHOULDER ARTHROPLASTY Left 12/11/2018   Procedure: LEFT REVERSE SHOULDER ARTHROPLASTY;  Surgeon: Meredith Pel, MD;  Location: [place];  Service: Orthopedics;  Laterality: Left;  . SHOULDER ARTHROSCOPY WITH OPEN ROTATOR CUFF REPAIR AND DISTAL CLAVICLE ACROMINECTOMY Right 11/26/2013   Procedure: RIGHT SHOULDER ARTHROSCOPY WITH MINI OPEN ROTATOR CUFF REPAIR AND DISTAL CLAVICLE RESECTION, SUBACROMIAL DECOMPRESSION, POSSIBLE [facility] PATCH.;  Surgeon: [name], MD;  Location: [place];  Service: Orthopedics;  Laterality: Right;  . TOTAL KNEE ARTHROPLASTY Right 03/21/2017  . TOTAL KNEE ARTHROPLASTY Right 03/21/2017   Procedure: RIGHT TOTAL KNEE ARTHROPLASTY;  Surgeon: [name], MD;  Location: [place];  Service: Orthopedics;  Laterality: Right;  . TUBAL LIGATION      There were no vitals filed for this visit.  Subjective Assessment - 06/26/19 0953    Subjective  Patient reports minimal shoulder pain today, but has 5/10 L wrist pain following carpel tunnel release Monday. Is in a soft cast until next Wednesday (3/24) with instructions not to grip or lift with L hand    Pertinent History  Pt is a 69 year old female s/p L reverse total shoulder 12/11/18. Reports she was immobilized in her sling for 2 weeks. After immobilization has been using passive motion machine for flexion. Has been doing AAROM flexion/abd/ER, bicep, shrugs, and forward punches with 1# with HHPT over the past week. MD Marlou Sa advised patient for out of  sling, AAROM, PROM with no lifting of more than 5# until next followup with him (mid Oct). Pt reports only 2/10 at rest, 6/10 with motion. Pain is at the medial upper arm and in the armpit. Pain is worse during the evening after moving it all day. Patient lives with her husband and her 4 dogs, which she walks frequently and cares for. She is R handed.Pt denies N/V, B&B changes, unexplained weight fluctuation, saddle paresthesia, fever, night sweats, or unrelenting night pain at this time.Pt denies N/V, B&B changes, unexplained weight fluctuation, saddle paresthesia, fever, night sweats, or unrelenting night pain at this time.    Limitations  House hold activities;Lifting    How long can you sit comfortably?  unlimited    How long can you stand comfortably?  unlimited    How long can you walk comfortably?  unlimited    Diagnostic tests  Xrays    Patient Stated Goals  Being able to complete daily tasks       THEREX - Supine flexion 2x 12 with cuing for control with good carry over - Supine abd 2x 12 with cuing to maintain contact to mat table for scapular retraction - Supine scap retract/protract with shoulder 90d flex, elbow ext 2x 12 with cuing for retraction into mat table with good carry over - Sidelying ER with towel under elbow 2x 12  Manual PROM with grade 3 post mobilization into all directions with most focus on abd and flex STM with trigger point release to pec minor and latissimus/teres minor                         PT Education - 06/26/19 1004    Education Details  therex, manual techniques    Person(s) Educated  Patient    Methods  Explanation;Demonstration;Tactile cues;Verbal cues    Comprehension  Verbalized understanding;Returned demonstration;Verbal cues required;Tactile cues required       PT Short Term Goals - 06/05/19 1350      PT SHORT TERM GOAL #1   Title  Pt will be independent with HEP in order to improve strength and balance in order to  improve function at home    Baseline  01/02/19; 05/07/2019 does them "most of the time"; 06/05/19 completing regularly    Time  4    Period  Weeks    Status  Partially Met        PT Long Term Goals - 06/05/19 1351      PT LONG TERM GOAL #1   Title  Pt will decrease quick DASH score by at least 8% in order to demonstrate clinically significant reduction in disability.  Baseline  02/25/19 56.8%; 05/07/2019 40.9%    Time  8    Period  Weeks    Status  Achieved      PT LONG TERM GOAL #2   Title  Patient will demonstrate full AROM in L shoulder in order to complete household ADLs    Baseline  05/07/2019; flexion:130 ABD 115d IR S2 ER C8; 04/01/19 flex 121d abd 110d IR L PSIS ER C8; 06/05/19 flex 136d abd 118d IR L2 ER C8    Time  8    Period  Weeks    Status  On-going      PT LONG TERM GOAL #3   Title  Pt will decrease worst pain as reported on NPRS by at least 3 points in order to demonstrate clinically significant reduction in pain.    Baseline  02/25/19 2/10 pain with movement; 04/01/19 3/10 with overhead motion; 05/07/2019 4/10 with ADLs; 06/05/19 No pain just soreness    Time  8    Period  Weeks    Status  Achieved      PT LONG TERM GOAL #4   Title  Pt will demonstrate gross periscapular strength of 4/5 in order to safely lift for heavy household chores    Baseline  05/07/2019; R/L shoulder flex 4+/4; ABD* 4/4; ER 5/5; IR 5/4; Pariscap Y: 3/3-gradual release from test position; T: 4/4-, I 5/4; 06/05/19 R/L shoulder flex 5/5; ABD 4+/4+; ER 5/4+; IR 5/4; Pariscap Y: 3+/3-gradual release from test position; T: 4+/4+-, I 5/4+ Latissimus    Time  8    Period  Weeks    Status  Partially Met      PT LONG TERM GOAL #5   Title  Patient will be able to carry 20lb backpack 276f in order to demonstrate safety with taking her hunting equipment into the woods    Baseline  04/01/19 10# DB farmers carry with fatigue at 1039fand some increased pain; 05/07/2019 10# DB carry [id number]  carry 100 ft at L shoulder) with some fatigue but not increase in pain; Able to complete 100ft3fh 35# and 100ft 32f 20#    Time  6    Period  Weeks    Status  Achieved      PT LONG TERM GOAL #6   Title  Pt will be able to bend over and pick up 15# DB and carry 20 feet in order to be able to carry dogs outside and dog food into house    Baseline  05/07/2019; able to pick up 10# DB off the floor and walk 20 feet with medium difficulty and fatigue; 06/05/19 20# carry 20ft [id number], and moderate difficulty    Time  6    Period  Weeks    Status  On-going            Plan - 06/26/19 1023    Clinical Impression Statement  PT utilized manual techniques to increase motion as patinet is unable to grip or lift with LUE today d/t L carpel tunnel release with subsequent casting. PT lead patient through therex to maintain motion from manual techniques and for endurance with good carry over of all cuing for technique. PT will continue progression as able.    Personal Factors and Comorbidities  Age;Sex;Behavior Pattern;Comorbidity 1;Fitness    Comorbidities  HTN    Examination-Activity Limitations  Dressing;Carry;Reach Overhead;Lift    Examination-Participation Restrictions  Driving;Community Activity;Cleaning    Stability/Clinical Decision Making  Evolving/Moderate complexity  Clinical Decision Making  Moderate    Rehab Potential  Good    PT Frequency  2x / week    PT Duration  8 weeks    PT Treatment/Interventions  ADLs/Self Care Home Management;Electrical Stimulation;Therapeutic activities;Passive range of motion;Manual techniques;Patient/family education;Therapeutic exercise;Iontophoresis 15m/ml Dexamethasone;Moist Heat;Traction;Cryotherapy;Ultrasound;Functional mobility training;Neuromuscular re-education;Balance training;Dry needling;Joint Manipulations    PT Next Visit Plan  progress strength therex for goals    PT Home Exercise Plan  [id number]   Consulted and Agree with Plan of Care   Patient       Patient will benefit from skilled therapeutic intervention in order to improve the following deficits and impairments:  Decreased mobility, Decreased endurance, Pain, Postural dysfunction, Impaired UE functional use, Impaired flexibility, Increased fascial restricitons, Decreased strength, Decreased coordination, Decreased range of motion, Improper body mechanics  Visit Diagnosis: Stiffness of left shoulder, not elsewhere classified  Muscle weakness (generalized)  Acute pain of left shoulder     Problem List Patient Active Problem List   Diagnosis Date Noted  . 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 Chronic left shoulder pain 08/23/2018  . Chronic diarrhea 12/07/2014  . Segmental colitis with rectal bleeding ([place] 09/12/2014  . Routine general medical examination at a health care facility 06/05/2014  . Overweight 06/13/2011  . ABNORMAL CHEST XRAY 09/30/2007  . Hyperlipidemia 02/17/2007  . Essential hypertension 01/08/2007  . Osteoarthritis 01/08/2007   CShelton SilvasPT, DPT CShelton Silvas3/17/2021, 10:26 AM  [facility] [address] [place] [address]Phone: [phone]  Fax:  [phone] Name: Stacy DERRYBERRYMRN: [id number] of Birth: 301-28-1952

## 2019-06-28 ENCOUNTER — Telehealth: Payer: Self-pay

## 2019-06-28 ENCOUNTER — Other Ambulatory Visit: Payer: Self-pay

## 2019-06-28 ENCOUNTER — Encounter: Payer: Self-pay | Admitting: Orthopedic Surgery

## 2019-06-28 ENCOUNTER — Ambulatory Visit (INDEPENDENT_AMBULATORY_CARE_PROVIDER_SITE_OTHER): Payer: Medicare Other | Admitting: Orthopedic Surgery

## 2019-06-28 DIAGNOSIS — G5603 Carpal tunnel syndrome, bilateral upper limbs: Secondary | ICD-10-CM

## 2019-06-28 NOTE — Telephone Encounter (Signed)
Pt seen in clinic this am

## 2019-06-28 NOTE — Telephone Encounter (Signed)
Pt called and lm on vm  states that she is s/p CTR with Dr. Marlou Sa and her splint is causing her significant pain. She states that she wants to take it off because it is so uncomfortable.

## 2019-06-28 NOTE — Progress Notes (Signed)
Post-Op Visit Note   Patient: [name]           Date of Birth: Jun 03, 1950           MRN: [id number] Visit Date: 06/28/2019 PCP: Hoyt Koch, MD   Assessment & Plan:  Chief Complaint:  Chief Complaint  Patient presents with  . Post-op Follow-up   Visit Diagnoses:  1. Bilateral carpal tunnel syndrome     Plan: Patient is a 69 year old female who presents s/p left carpal tunnel release on 06/24/2019.  She is about 4 days out from surgery.  She notes that all of the numbness and tingling in her left hand has resolved except for slight numbness in the very tip of her left thumb.  She notes some pain in the base of the left thumb as well as at the base of the small finger.  Sutures are intact with no surrounding redness or expressible discharge.  No pain out of proportion on exam..  There will be removed at her next office visit which is planned for 07/04/2019.  She is taking Tylenol with the occasional oxycodone.  She cannot take NSAIDs as she has GI irritation.  She does have a history of RSD.  Follow-Up Instructions: No follow-ups on file.   Orders:  No orders of the defined types were placed in this encounter.  No orders of the defined types were placed in this encounter.   Imaging: No results found.  PMFS History: Patient Active Problem List   Diagnosis Date Noted  . 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 Chronic left shoulder pain 08/23/2018  . Chronic diarrhea 12/07/2014  . Segmental colitis with rectal bleeding ([place]) 09/12/2014  . Routine general medical examination at a health care facility 06/05/2014  . Overweight 06/13/2011  . ABNORMAL CHEST XRAY 09/30/2007  . Hyperlipidemia 02/17/2007  . Essential hypertension 01/08/2007  . Osteoarthritis 01/08/2007   Past Medical History:  Diagnosis Date  . Arthritis    back, fingers with joint pain and swelling.  chronic back pain  . Chronic back pain    arthritis   . Diverticulosis   . Elevated  cholesterol    takes Niacin daily and Simvastatin  . GERD (gastroesophageal reflux disease) 06/2004   non-specific gastritis on EGD 06/2004  . Headache(784.0)    occasionally  . History of colon polyps 2004, 2009   2004:adenomatous. 2009 hyperplastic.   Marland Kitchen History of hiatal hernia   . Hypertension    takes Tenoretic and Lisinopril daily  . Mucoid cyst of joint 08/2013   right index finger  . Osteopenia 01/2014   T score -1.1 FRAX 14%/0.5%. Stable from prior DEXA  . PONV (postoperative nausea and vomiting)   . Rotator cuff arthropathy    Left  . Seasonal allergies   . Stroke ([place]) 1998   x 2 - mild left-sided weakness  . Urge incontinence   . Uterine prolapse     Family History  Problem Relation Age of Onset  . Hypertension Mother   . Heart disease Mother   . Diabetes Father   . Hypertension Father   . Heart disease Father   . Stroke Father   . Diabetes Maternal Aunt   . Diabetes Paternal Grandmother   . Hypertension Paternal Grandfather   . Stroke Paternal Grandfather     Past Surgical History:  Procedure Laterality Date  . BACK SURGERY     x 2  . BELPHAROPTOSIS REPAIR Bilateral 12/14/2017  . CATARACT  EXTRACTION    . CATARACT EXTRACTION Bilateral 10/2017  . COLONOSCOPY  2004, 2009, 2014  . FINGER SURGERY    . GUM SURGERY    . GYNECOLOGIC CRYOSURGERY    . HYSTEROSCOPY WITH D & C  12/07/2010   with resection of endometrial polyp  . JOINT REPLACEMENT    . KNEE ARTHROSCOPY Left 2008  . lip biopsy     done at MD office Fri 11/22/13  . MASS EXCISION Right 08/15/2013   Procedure: RIGHT INDEX EXCISION MASS ;  Surgeon: Tennis Must, MD;  Location: [place];  Service: Orthopedics;  Laterality: Right;  . NASAL SEPTUM SURGERY    . OOPHORECTOMY Right 2000  . REVERSE SHOULDER ARTHROPLASTY Left 12/11/2018  . REVERSE SHOULDER ARTHROPLASTY Left 12/11/2018   Procedure: LEFT REVERSE SHOULDER ARTHROPLASTY;  Surgeon: Meredith Pel, MD;  Location: [place];  Service:  Orthopedics;  Laterality: Left;  . SHOULDER ARTHROSCOPY WITH OPEN ROTATOR CUFF REPAIR AND DISTAL CLAVICLE ACROMINECTOMY Right 11/26/2013   Procedure: RIGHT SHOULDER ARTHROSCOPY WITH MINI OPEN ROTATOR CUFF REPAIR AND DISTAL CLAVICLE RESECTION, SUBACROMIAL DECOMPRESSION, POSSIBLE [facility] PATCH.;  Surgeon: [name], MD;  Location: [place];  Service: Orthopedics;  Laterality: Right;  . TOTAL KNEE ARTHROPLASTY Right 03/21/2017  . TOTAL KNEE ARTHROPLASTY Right 03/21/2017   Procedure: RIGHT TOTAL KNEE ARTHROPLASTY;  Surgeon: [name], MD;  Location: [place];  Service: Orthopedics;  Laterality: Right;  . TUBAL LIGATION     Social History   Occupational History  . Occupation: Animal nutritionist    Comment: Disability/Retired  Tobacco Use  . Smoking status: Passive Smoke Exposure - Never Smoker  . Smokeless tobacco: Never Used  Substance and Sexual Activity  . Alcohol use: Not Currently    Alcohol/week: 0.0 standard drinks    Comment: about 2-3 times a year  . Drug use: No  . Sexual activity: Yes    Partners: Male    Birth control/protection: Surgical, Post-menopausal    Comment: BTL-1st intercourse 69 yo-More than 5 partners

## 2019-07-02 ENCOUNTER — Ambulatory Visit: Payer: Medicare Other | Admitting: Physical Therapy

## 2019-07-02 ENCOUNTER — Other Ambulatory Visit: Payer: Self-pay

## 2019-07-02 ENCOUNTER — Encounter: Payer: Self-pay | Admitting: Physical Therapy

## 2019-07-02 DIAGNOSIS — R262 Difficulty in walking, not elsewhere classified: Secondary | ICD-10-CM | POA: Diagnosis not present

## 2019-07-02 DIAGNOSIS — M6281 Muscle weakness (generalized): Secondary | ICD-10-CM

## 2019-07-02 DIAGNOSIS — M25612 Stiffness of left shoulder, not elsewhere classified: Secondary | ICD-10-CM | POA: Diagnosis not present

## 2019-07-02 DIAGNOSIS — M25512 Pain in left shoulder: Secondary | ICD-10-CM | POA: Diagnosis not present

## 2019-07-02 NOTE — Therapy (Signed)
[facility] [address] [place], [address] Phone: [phone]   Fax:  [phone]  Physical Therapy Treatment  Patient Details  Name: [name] MRN: [id number] Date of Birth: [birth year] No data recorded  Encounter Date: 07/02/2019  PT End of Session - 07/02/19 1144    Visit Number  30    Number of Visits  19    Date for PT Re-Evaluation  07/03/19    Authorization Type  medicare    Authorization - Visit Number  3    Authorization - Number of Visits  10    PT Start Time  1115    PT Stop Time  1155    PT Time Calculation (min)  40 min    Activity Tolerance  Patient tolerated treatment well    Behavior During Therapy  [facility] for tasks assessed/performed       Past Medical History:  Diagnosis Date  . Arthritis    back, fingers with joint pain and swelling.  chronic back pain  . Chronic back pain    arthritis   . Diverticulosis   . Elevated cholesterol    takes Niacin daily and Simvastatin  . GERD (gastroesophageal reflux disease) 06/2004   non-specific gastritis on EGD 06/2004  . Headache(784.0)    occasionally  . History of colon polyps 2004, 2009   2004:adenomatous. 2009 hyperplastic.   Marland Kitchen History of hiatal hernia   . Hypertension    takes Tenoretic and Lisinopril daily  . Mucoid cyst of joint 08/2013   right index finger  . Osteopenia 01/2014   T score -1.1 FRAX 14%/0.5%. Stable from prior DEXA  . PONV (postoperative nausea and vomiting)   . Rotator cuff arthropathy    Left  . Seasonal allergies   . Stroke ([place]) 1998   x 2 - mild left-sided weakness  . Urge incontinence   . Uterine prolapse     Past Surgical History:  Procedure Laterality Date  . BACK SURGERY     x 2  . BELPHAROPTOSIS REPAIR Bilateral 12/14/2017  . CATARACT EXTRACTION    . CATARACT EXTRACTION Bilateral 10/2017  . COLONOSCOPY  2004, 2009, 2014  . FINGER SURGERY    . GUM SURGERY    . GYNECOLOGIC CRYOSURGERY     . HYSTEROSCOPY WITH D & C  12/07/2010   with resection of endometrial polyp  . JOINT REPLACEMENT    . KNEE ARTHROSCOPY Left 2008  . lip biopsy     done at MD office Fri 11/22/13  . MASS EXCISION Right 08/15/2013   Procedure: RIGHT INDEX EXCISION MASS ;  Surgeon: Tennis Must, MD;  Location: [place];  Service: Orthopedics;  Laterality: Right;  . NASAL SEPTUM SURGERY    . OOPHORECTOMY Right 2000  . REVERSE SHOULDER ARTHROPLASTY Left 12/11/2018  . REVERSE SHOULDER ARTHROPLASTY Left 12/11/2018   Procedure: LEFT REVERSE SHOULDER ARTHROPLASTY;  Surgeon: Meredith Pel, MD;  Location: [place];  Service: Orthopedics;  Laterality: Left;  . SHOULDER ARTHROSCOPY WITH OPEN ROTATOR CUFF REPAIR AND DISTAL CLAVICLE ACROMINECTOMY Right 11/26/2013   Procedure: RIGHT SHOULDER ARTHROSCOPY WITH MINI OPEN ROTATOR CUFF REPAIR AND DISTAL CLAVICLE RESECTION, SUBACROMIAL DECOMPRESSION, POSSIBLE [facility] PATCH.;  Surgeon: [name], MD;  Location: [place];  Service: Orthopedics;  Laterality: Right;  . TOTAL KNEE ARTHROPLASTY Right 03/21/2017  . TOTAL KNEE ARTHROPLASTY Right 03/21/2017   Procedure: RIGHT TOTAL KNEE ARTHROPLASTY;  Surgeon: [name], MD;  Location: [place];  Service: Orthopedics;  Laterality: Right;  . TUBAL LIGATION      There were no vitals filed for this visit.  Subjective Assessment - 07/02/19 1116    Subjective  She went to MD Friday because her thumb and pinky were hurting so badly she had to have her cast taken off the wrist. Is still unable to lift or grip and is having stitches taken out Thursday. She reports her L shoulder is a little stiff d/t not moving the arm as much. Reports pain is 3/10 in the shoulde    Pertinent History  Pt is a 69 year old female s/p L reverse total shoulder 12/11/18. Reports she was immobilized in her sling for 2 weeks. After immobilization has been using passive motion machine for flexion. Has been doing AAROM flexion/abd/ER, bicep,  shrugs, and forward punches with 1# with HHPT over the past week. MD Marlou Sa advised patient for out of sling, AAROM, PROM with no lifting of more than 5# until next followup with him (mid Oct). Pt reports only 2/10 at rest, 6/10 with motion. Pain is at the medial upper arm and in the armpit. Pain is worse during the evening after moving it all day. Patient lives with her husband and her 4 dogs, which she walks frequently and cares for. She is R handed.Pt denies N/V, B&B changes, unexplained weight fluctuation, saddle paresthesia, fever, night sweats, or unrelenting night pain at this time.Pt denies N/V, B&B changes, unexplained weight fluctuation, saddle paresthesia, fever, night sweats, or unrelenting night pain at this time.    Limitations  House hold activities;Lifting    How long can you sit comfortably?  unlimited    How long can you stand comfortably?  unlimited    How long can you walk comfortably?  unlimited    Diagnostic tests  Xrays    Patient Stated Goals  Being able to complete daily tasks    Pain Onset  1 to 4 weeks ago         THEREX - Supine flexion 3x 12 with cuing for control with good carry over - Sidelying abd 3x 12 with cuing to maintain contact to mat table for scapular retraction - Supine scap retract/protract with shoulder 90d flex, elbow ext 3x 12 with cuing for retraction into mat table with good carry over - Sidelying ER with towel under elbow 3x 12  Manual PROM with grade 3 post mobilization into all directions with most focus on abd and flex STM with trigger point release to pec minor and latissimus/teres minor                       PT Education - 07/02/19 1205    Education Details  therex form/technique    Person(s) Educated  Patient    Methods  Explanation;Demonstration;Tactile cues;Verbal cues    Comprehension  Verbalized understanding;Need further instruction;Returned demonstration;Verbal cues required;Tactile cues required       PT  Short Term Goals - 06/05/19 1350      PT SHORT TERM GOAL #1   Title  Pt will be independent with HEP in order to improve strength and balance in order to improve function at home    Baseline  01/02/19; 05/07/2019 does them "most of the time"; 06/05/19 completing regularly    Time  4    Period  Weeks    Status  Partially Met        PT Long Term Goals - 06/05/19  [place] #1   Title  Pt will decrease quick DASH score by at least 8% in order to demonstrate clinically significant reduction in disability.    Baseline  02/25/19 56.8%; 05/07/2019 40.9%    Time  8    Period  Weeks    Status  Achieved      PT LONG TERM GOAL #2   Title  Patient will demonstrate full AROM in L shoulder in order to complete household ADLs    Baseline  05/07/2019; flexion:130 ABD 115d IR S2 ER C8; 04/01/19 flex 121d abd 110d IR L PSIS ER C8; 06/05/19 flex 136d abd 118d IR L2 ER C8    Time  8    Period  Weeks    Status  On-going      PT LONG TERM GOAL #3   Title  Pt will decrease worst pain as reported on NPRS by at least 3 points in order to demonstrate clinically significant reduction in pain.    Baseline  02/25/19 2/10 pain with movement; 04/01/19 3/10 with overhead motion; 05/07/2019 4/10 with ADLs; 06/05/19 No pain just soreness    Time  8    Period  Weeks    Status  Achieved      PT LONG TERM GOAL #4   Title  Pt will demonstrate gross periscapular strength of 4/5 in order to safely lift for heavy household chores    Baseline  05/07/2019; R/L shoulder flex 4+/4; ABD* 4/4; ER 5/5; IR 5/4; Pariscap Y: 3/3-gradual release from test position; T: 4/4-, I 5/4; 06/05/19 R/L shoulder flex 5/5; ABD 4+/4+; ER 5/4+; IR 5/4; Pariscap Y: 3+/3-gradual release from test position; T: 4+/4+-, I 5/4+ Latissimus    Time  8    Period  Weeks    Status  Partially Met      PT LONG TERM GOAL #5   Title  Patient will be able to carry 20lb backpack 241f in order to demonstrate safety with taking her hunting  equipment into the woods    Baseline  04/01/19 10# DB farmers carry with fatigue at 1011fand some increased pain; 05/07/2019 10# DB carry [id number] carry 100 ft at L shoulder) with some fatigue but not increase in pain; Able to complete 100ft80fh 35# and 100ft 39f 20#    Time  6    Period  Weeks    Status  Achieved      PT LONG TERM GOAL #6   Title  Pt will be able to bend over and pick up 15# DB and carry 20 feet in order to be able to carry dogs outside and dog food into house    Baseline  05/07/2019; able to pick up 10# DB off the floor and walk 20 feet with medium difficulty and fatigue; 06/05/19 20# carry 20ft w70ffatigue, and moderate difficulty    Time  6    Period  Weeks    Status  On-going            Plan - 07/02/19 1202    Clinical Impression Statement  D/t patient's wrist restrictions PT continued modified treatment with focus on motion, as patient is having increased restrictions d/t immobilization. Patient is able to complete all therex with proper technique following some cuing. PT will continue progression as able.    Personal Factors and Comorbidities  Age;Sex;Behavior Pattern;Comorbidity 1;Fitness    Comorbidities  HTN    Examination-Activity Limitations  Dressing;Carry;Reach Overhead;Lift    Examination-Participation Restrictions  Driving;Community Activity;Cleaning    Stability/Clinical Decision Making  Evolving/Moderate complexity    Clinical Decision Making  Moderate    Rehab Potential  Good    PT Frequency  2x / week    PT Duration  8 weeks    PT Treatment/Interventions  ADLs/Self Care Home Management;Electrical Stimulation;Therapeutic activities;Passive range of motion;Manual techniques;Patient/family education;Therapeutic exercise;Iontophoresis 27m/ml Dexamethasone;Moist Heat;Traction;Cryotherapy;Ultrasound;Functional mobility training;Neuromuscular re-education;Balance training;Dry needling;Joint Manipulations    PT Next Visit Plan  progress  strength therex for goals    PT Home Exercise Plan  [id number]   Consulted and Agree with Plan of Care  Patient       Patient will benefit from skilled therapeutic intervention in order to improve the following deficits and impairments:  Decreased mobility, Decreased endurance, Pain, Postural dysfunction, Impaired UE functional use, Impaired flexibility, Increased fascial restricitons, Decreased strength, Decreased coordination, Decreased range of motion, Improper body mechanics  Visit Diagnosis: Stiffness of left shoulder, not elsewhere classified  Muscle weakness (generalized)  Acute pain of left shoulder  Difficulty in walking, not elsewhere classified     Problem List Patient Active Problem List   Diagnosis Date Noted  . 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 Chronic left shoulder pain 08/23/2018  . Chronic diarrhea 12/07/2014  . Segmental colitis with rectal bleeding ([place] 09/12/2014  . Routine general medical examination at a health care facility 06/05/2014  . Overweight 06/13/2011  . ABNORMAL CHEST XRAY 09/30/2007  . Hyperlipidemia 02/17/2007  . Essential hypertension 01/08/2007  . Osteoarthritis 01/08/2007   CShelton SilvasPT, DPT CShelton Silvas3/23/2021, 1:45 PM  [facility] [address] [place] [address]Phone: [phone]  Fax:  [phone] Name: NDEANNE BEDGOODMRN: [id number] of Birth: [birth year]

## 2019-07-03 ENCOUNTER — Inpatient Hospital Stay: Payer: Medicare Other | Admitting: Orthopedic Surgery

## 2019-07-04 ENCOUNTER — Ambulatory Visit (INDEPENDENT_AMBULATORY_CARE_PROVIDER_SITE_OTHER): Payer: Medicare Other | Admitting: Orthopedic Surgery

## 2019-07-04 ENCOUNTER — Other Ambulatory Visit: Payer: Self-pay

## 2019-07-04 ENCOUNTER — Encounter: Payer: Self-pay | Admitting: Orthopedic Surgery

## 2019-07-04 DIAGNOSIS — G5603 Carpal tunnel syndrome, bilateral upper limbs: Secondary | ICD-10-CM

## 2019-07-04 NOTE — Progress Notes (Signed)
Post-Op Visit Note   Patient: [name]           Date of Birth: 1950-08-05           MRN: [id number] Visit Date: 07/04/2019 PCP: Hoyt Koch, MD   Assessment & Plan:  Chief Complaint:  Chief Complaint  Patient presents with  . Follow-up   Visit Diagnoses: No diagnosis found.  Plan: Rosia is now 10 days out left carpal tunnel release.  Her wrist feels better getting the splint off.  On exam she has good abductor pollicis brevis strength.  Sutures removed.  Continue with range of motion exercises for the wrist and fingers but no active weight lifting.  Come back in 3 weeks and we may post for right carpal tunnel release at that time.  Follow-Up Instructions: No follow-ups on file.   Orders:  No orders of the defined types were placed in this encounter.  No orders of the defined types were placed in this encounter.   Imaging: No results found.  PMFS History: Patient Active Problem List   Diagnosis Date Noted  . 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 Chronic left shoulder pain 08/23/2018  . Chronic diarrhea 12/07/2014  . Segmental colitis with rectal bleeding ([place]) 09/12/2014  . Routine general medical examination at a health care facility 06/05/2014  . Overweight 06/13/2011  . ABNORMAL CHEST XRAY 09/30/2007  . Hyperlipidemia 02/17/2007  . Essential hypertension 01/08/2007  . Osteoarthritis 01/08/2007   Past Medical History:  Diagnosis Date  . Arthritis    back, fingers with joint pain and swelling.  chronic back pain  . Chronic back pain    arthritis   . Diverticulosis   . Elevated cholesterol    takes Niacin daily and Simvastatin  . GERD (gastroesophageal reflux disease) 06/2004   non-specific gastritis on EGD 06/2004  . Headache(784.0)    occasionally  . History of colon polyps 2004, 2009   2004:adenomatous. 2009 hyperplastic.   Marland Kitchen History of hiatal hernia   . Hypertension    takes Tenoretic and Lisinopril daily  . Mucoid cyst of  joint 08/2013   right index finger  . Osteopenia 01/2014   T score -1.1 FRAX 14%/0.5%. Stable from prior DEXA  . PONV (postoperative nausea and vomiting)   . Rotator cuff arthropathy    Left  . Seasonal allergies   . Stroke ([place]) 1998   x 2 - mild left-sided weakness  . Urge incontinence   . Uterine prolapse     Family History  Problem Relation Age of Onset  . Hypertension Mother   . Heart disease Mother   . Diabetes Father   . Hypertension Father   . Heart disease Father   . Stroke Father   . Diabetes Maternal Aunt   . Diabetes Paternal Grandmother   . Hypertension Paternal Grandfather   . Stroke Paternal Grandfather     Past Surgical History:  Procedure Laterality Date  . BACK SURGERY     x 2  . BELPHAROPTOSIS REPAIR Bilateral 12/14/2017  . CATARACT EXTRACTION    . CATARACT EXTRACTION Bilateral 10/2017  . COLONOSCOPY  2004, 2009, 2014  . FINGER SURGERY    . GUM SURGERY    . GYNECOLOGIC CRYOSURGERY    . HYSTEROSCOPY WITH D & C  12/07/2010   with resection of endometrial polyp  . JOINT REPLACEMENT    . KNEE ARTHROSCOPY Left 2008  . lip biopsy     done at MD office  Fri 11/22/13  . MASS EXCISION Right 08/15/2013   Procedure: RIGHT INDEX EXCISION MASS ;  Surgeon: Tennis Must, MD;  Location: [place];  Service: Orthopedics;  Laterality: Right;  . NASAL SEPTUM SURGERY    . OOPHORECTOMY Right 2000  . REVERSE SHOULDER ARTHROPLASTY Left 12/11/2018  . REVERSE SHOULDER ARTHROPLASTY Left 12/11/2018   Procedure: LEFT REVERSE SHOULDER ARTHROPLASTY;  Surgeon: Meredith Pel, MD;  Location: [place];  Service: Orthopedics;  Laterality: Left;  . SHOULDER ARTHROSCOPY WITH OPEN ROTATOR CUFF REPAIR AND DISTAL CLAVICLE ACROMINECTOMY Right 11/26/2013   Procedure: RIGHT SHOULDER ARTHROSCOPY WITH MINI OPEN ROTATOR CUFF REPAIR AND DISTAL CLAVICLE RESECTION, SUBACROMIAL DECOMPRESSION, POSSIBLE [facility] PATCH.;  Surgeon: [name], MD;  Location: [place];  Service:  Orthopedics;  Laterality: Right;  . TOTAL KNEE ARTHROPLASTY Right 03/21/2017  . TOTAL KNEE ARTHROPLASTY Right 03/21/2017   Procedure: RIGHT TOTAL KNEE ARTHROPLASTY;  Surgeon: [name], MD;  Location: [place];  Service: Orthopedics;  Laterality: Right;  . TUBAL LIGATION     Social History   Occupational History  . Occupation: Animal nutritionist    Comment: Disability/Retired  Tobacco Use  . Smoking status: Passive Smoke Exposure - Never Smoker  . Smokeless tobacco: Never Used  Substance and Sexual Activity  . Alcohol use: Not Currently    Alcohol/week: 0.0 standard drinks    Comment: about 2-3 times a year  . Drug use: No  . Sexual activity: Yes    Partners: Male    Birth control/protection: Surgical, Post-menopausal    Comment: BTL-1st intercourse 69 yo-More than 5 partners

## 2019-07-10 ENCOUNTER — Encounter: Payer: Self-pay | Admitting: Physical Therapy

## 2019-07-10 ENCOUNTER — Other Ambulatory Visit: Payer: Self-pay

## 2019-07-10 ENCOUNTER — Ambulatory Visit: Payer: Medicare Other | Admitting: Physical Therapy

## 2019-07-10 DIAGNOSIS — M6281 Muscle weakness (generalized): Secondary | ICD-10-CM

## 2019-07-10 DIAGNOSIS — M25512 Pain in left shoulder: Secondary | ICD-10-CM

## 2019-07-10 DIAGNOSIS — R262 Difficulty in walking, not elsewhere classified: Secondary | ICD-10-CM | POA: Diagnosis not present

## 2019-07-10 DIAGNOSIS — M25612 Stiffness of left shoulder, not elsewhere classified: Secondary | ICD-10-CM | POA: Diagnosis not present

## 2019-07-10 NOTE — Therapy (Signed)
[facility] [address], [place], [address] Phone: [phone]   Fax:  [phone]  Physical Therapy Treatment  Patient Details  Name: [name] MRN: [id number] Date of Birth: 05/02/1950 No data recorded  Encounter Date: 07/10/2019  PT End of Session - 07/10/19 1307    Visit Number  31    Number of Visits  28    Date for PT Re-Evaluation  08/02/19    Authorization Type  medicare    Authorization - Visit Number  4    Authorization - Number of Visits  10    PT Start Time  0100    PT Stop Time  0140    PT Time Calculation (min)  40 min    Activity Tolerance  Patient tolerated treatment well    Behavior During Therapy  [facility] for tasks assessed/performed       Past Medical History:  Diagnosis Date  . Arthritis    back, fingers with joint pain and swelling.  chronic back pain  . Chronic back pain    arthritis   . Diverticulosis   . Elevated cholesterol    takes Niacin daily and Simvastatin  . GERD (gastroesophageal reflux disease) 06/2004   non-specific gastritis on EGD 06/2004  . Headache(784.0)    occasionally  . History of colon polyps 2004, 2009   2004:adenomatous. 2009 hyperplastic.   Marland Kitchen History of hiatal hernia   . Hypertension    takes Tenoretic and Lisinopril daily  . Mucoid cyst of joint 08/2013   right index finger  . Osteopenia 01/2014   T score -1.1 FRAX 14%/0.5%. Stable from prior DEXA  . PONV (postoperative nausea and vomiting)   . Rotator cuff arthropathy    Left  . Seasonal allergies   . Stroke ([place]) 1998   x 2 - mild left-sided weakness  . Urge incontinence   . Uterine prolapse     Past Surgical History:  Procedure Laterality Date  . BACK SURGERY     x 2  . BELPHAROPTOSIS REPAIR Bilateral 12/14/2017  . CATARACT EXTRACTION    . CATARACT EXTRACTION Bilateral 10/2017  . COLONOSCOPY  2004, 2009, 2014  . FINGER SURGERY    . GUM SURGERY    . GYNECOLOGIC CRYOSURGERY     . HYSTEROSCOPY WITH D & C  12/07/2010   with resection of endometrial polyp  . JOINT REPLACEMENT    . KNEE ARTHROSCOPY Left 2008  . lip biopsy     done at MD office Fri 11/22/13  . MASS EXCISION Right 08/15/2013   Procedure: RIGHT INDEX EXCISION MASS ;  Surgeon: Tennis Must, MD;  Location: [place];  Service: Orthopedics;  Laterality: Right;  . NASAL SEPTUM SURGERY    . OOPHORECTOMY Right 2000  . REVERSE SHOULDER ARTHROPLASTY Left 12/11/2018  . REVERSE SHOULDER ARTHROPLASTY Left 12/11/2018   Procedure: LEFT REVERSE SHOULDER ARTHROPLASTY;  Surgeon: Meredith Pel, MD;  Location: [place];  Service: Orthopedics;  Laterality: Left;  . SHOULDER ARTHROSCOPY WITH OPEN ROTATOR CUFF REPAIR AND DISTAL CLAVICLE ACROMINECTOMY Right 11/26/2013   Procedure: RIGHT SHOULDER ARTHROSCOPY WITH MINI OPEN ROTATOR CUFF REPAIR AND DISTAL CLAVICLE RESECTION, SUBACROMIAL DECOMPRESSION, POSSIBLE [facility] PATCH.;  Surgeon: [name], MD;  Location: [place];  Service: Orthopedics;  Laterality: Right;  . TOTAL KNEE ARTHROPLASTY Right 03/21/2017  . TOTAL KNEE ARTHROPLASTY Right 03/21/2017   Procedure: RIGHT TOTAL KNEE ARTHROPLASTY;  Surgeon: [name], MD;  Location: [place];  Service: Orthopedics;  Laterality: Right;  . TUBAL LIGATION      There were no vitals filed for this visit.  Subjective Assessment - 07/10/19 1303    Subjective  Patient reports she went to MD to have cast removed and he advised do not lift anything in L hand until she returns to see him 2weeks. Reports no pain in the L shoulder today, some soreness.    Pertinent History  Pt is a 69 year old female s/p L reverse total shoulder 12/11/18. Reports she was immobilized in her sling for 2 weeks. After immobilization has been using passive motion machine for flexion. Has been doing AAROM flexion/abd/ER, bicep, shrugs, and forward punches with 1# with HHPT over the past week. MD Marlou Sa advised patient for out of sling, AAROM,  PROM with no lifting of more than 5# until next followup with him (mid Oct). Pt reports only 2/10 at rest, 6/10 with motion. Pain is at the medial upper arm and in the armpit. Pain is worse during the evening after moving it all day. Patient lives with her husband and her 4 dogs, which she walks frequently and cares for. She is R handed.Pt denies N/V, B&B changes, unexplained weight fluctuation, saddle paresthesia, fever, night sweats, or unrelenting night pain at this time.Pt denies N/V, B&B changes, unexplained weight fluctuation, saddle paresthesia, fever, night sweats, or unrelenting night pain at this time.    Limitations  House hold activities;Lifting    How long can you sit comfortably?  unlimited    How long can you stand comfortably?  unlimited    How long can you walk comfortably?  unlimited    Diagnostic tests  Xrays    Patient Stated Goals  Being able to complete daily tasks    Pain Onset  1 to 4 weeks ago       THEREX - UBE forward 65mns backward 384ms without resistance per stitch removal - Scaption BW x10; 2# AW on UEs 2x 10 with good carry over from prior sessions for technique - bilat horizontal abduction with RTB loop at forearm 3x 10 min cuing for posture set up with scapular retraction with good carry over - Bent over row 5# AW on forearm 3x 10 with cuing for neutral spine set up with good carry over following - Standing ER and IR with looped GTB on forearm 3x 10 each with towel roll, min cuing to prevent trunk rotation compensation with good carry over                           PT Education - 07/10/19 1306    Education Details  therex form/technique    Person(s) Educated  Patient    Methods  Explanation;Demonstration;Verbal cues    Comprehension  Verbalized understanding;Returned demonstration;Verbal cues required       PT Short Term Goals - 06/05/19 1350      PT SHORT TERM GOAL #1   Title  Pt will be independent with HEP in order to improve  strength and balance in order to improve function at home    Baseline  01/02/19; 05/07/2019 does them "most of the time"; 06/05/19 completing regularly    Time  4    Period  Weeks    Status  Partially Met        PT Long Term Goals - 07/10/19 1312      PT LONG TERM GOAL #1   Title  Pt will decrease quick DASH score by at least 8% in order to demonstrate clinically significant reduction in disability.    Baseline  02/25/19 56.8%; 05/07/2019 40.9%    Time  8    Period  Weeks    Status  Achieved      PT LONG TERM GOAL #2   Title  Patient will demonstrate full AROM in L shoulder in order to complete household ADLs    Baseline  05/07/2019; flexion:130 ABD 115d IR S2 ER C8; 04/01/19 flex 121d abd 110d IR L PSIS ER C8; 06/05/19 flex 136d abd 118d IR L2 ER C8; 07/10/19 flex 142d abd 114d IR T8 ER C5    Time  8    Period  Weeks    Status  On-going      PT LONG TERM GOAL #3   Title  Pt will decrease worst pain as reported on NPRS by at least 3 points in order to demonstrate clinically significant reduction in pain.    Baseline  02/25/19 2/10 pain with movement; 04/01/19 3/10 with overhead motion; 05/07/2019 4/10 with ADLs; 06/05/19 No pain just soreness    Time  8    Period  Weeks    Status  Achieved      PT LONG TERM GOAL #4   Title  Pt will demonstrate gross periscapular strength of 4/5 in order to safely lift for heavy household chores    Baseline  05/07/2019; R/L shoulder flex 4+/4; ABD* 4/4; ER 5/5; IR 5/4; Pariscap Y: 3/3-gradual release from test position; T: 4/4-, I 5/4; 06/05/19 R/L shoulder flex 5/5; ABD 4+/4+; ER 5/4+; IR 5/4; Pariscap Y: 3+/3-gradual release from test position; T: 4+/4+-, I 5/4+ Latissimus; 07/10/19 R/L Y: 3+/3 T: 4+/4+  I: 5/4+ Latissimus 5/5    Time  8    Period  Weeks    Status  Partially Met      PT LONG TERM GOAL #5   Title  Patient will be able to carry 20lb backpack 273f in order to demonstrate safety with taking her hunting equipment into the woods     Baseline  04/01/19 10# DB farmers carry with fatigue at 1076fand some increased pain; 05/07/2019 10# DB carry [id number] carry 100 ft at L shoulder) with some fatigue but not increase in pain; Able to complete 100ft66fh 35# and 100ft 66f 20#    Time  6    Period  Weeks    Status  Deferred      PT LONG TERM GOAL #6   Title  Pt will be able to bend over and pick up 15# DB and carry 20 feet in order to be able to carry dogs outside and dog food into house    Baseline  05/07/2019; able to pick up 10# DB off the floor and walk 20 feet with medium difficulty and fatigue; 06/05/19 20# carry 20ft w86ffatigue, and moderate difficulty    Time  6    Period  Weeks    Status  Deferred            Plan - 07/10/19 1329    Clinical Impression Statement  Goals reassessed for re-cert this session. Patient has improved in motion and strength, but PT unable to re-assess weighted goals d/t surgical limitations following L wrist carpel tunnel release. Patient progress somewhat limited d/t carpel tunnel surgery, but patient has been able to maintain strength and motion dispite this. For this reason, patient will benefit from  skilled PT for 4weeks 1x/week to ensure goals are met after surgical precautions are lifted. PT will continue progression as able.    Personal Factors and Comorbidities  Age;Sex;Behavior Pattern;Comorbidity 1;Fitness    Comorbidities  HTN    Examination-Activity Limitations  Dressing;Carry;Reach Overhead;Lift    Examination-Participation Restrictions  Driving;Community Activity;Cleaning    Stability/Clinical Decision Making  Evolving/Moderate complexity    Clinical Decision Making  Moderate    Rehab Potential  Good    PT Frequency  2x / week    PT Duration  8 weeks    PT Treatment/Interventions  ADLs/Self Care Home Management;Electrical Stimulation;Therapeutic activities;Passive range of motion;Manual techniques;Patient/family education;Therapeutic exercise;Iontophoresis  1m/ml Dexamethasone;Moist Heat;Traction;Cryotherapy;Ultrasound;Functional mobility training;Neuromuscular re-education;Balance training;Dry needling;Joint Manipulations    PT Next Visit Plan  progress strength therex for goals    PT Home Exercise Plan  [id number]   Consulted and Agree with Plan of Care  Patient       Patient will benefit from skilled therapeutic intervention in order to improve the following deficits and impairments:  Decreased mobility, Decreased endurance, Pain, Postural dysfunction, Impaired UE functional use, Impaired flexibility, Increased fascial restricitons, Decreased strength, Decreased coordination, Decreased range of motion, Improper body mechanics  Visit Diagnosis: Stiffness of left shoulder, not elsewhere classified  Muscle weakness (generalized)  Acute pain of left shoulder  Difficulty in walking, not elsewhere classified     Problem List Patient Active Problem List   Diagnosis Date Noted  . 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 Chronic left shoulder pain 08/23/2018  . Chronic diarrhea 12/07/2014  . Seg[place] 09/12/[place]  . Routine general medical examination at a health care facility 06/05/2014  . Overweight 06/13/2011  . ABNORMAL CHEST XRAY 09/30/2007  . Hyperlipidemia 02/17/2007  . Essential hypertension 01/08/2007  . Osteoarthritis 01/08/2007    CShelton Silvas3/31/2021, 1:39 PM  [place] [facility] [address] [place] [address]Phone: [phone]  Fax:  [phone] Name: Stacy YOUNKERMRN: [id number] of Birth: 3October 24, 1952

## 2019-07-17 ENCOUNTER — Encounter: Payer: Self-pay | Admitting: Physical Therapy

## 2019-07-17 ENCOUNTER — Other Ambulatory Visit: Payer: Self-pay

## 2019-07-17 ENCOUNTER — Ambulatory Visit: Payer: Medicare Other | Attending: Orthopedic Surgery | Admitting: Physical Therapy

## 2019-07-17 DIAGNOSIS — M25612 Stiffness of left shoulder, not elsewhere classified: Secondary | ICD-10-CM | POA: Diagnosis not present

## 2019-07-17 DIAGNOSIS — M25512 Pain in left shoulder: Secondary | ICD-10-CM | POA: Insufficient documentation

## 2019-07-17 DIAGNOSIS — R262 Difficulty in walking, not elsewhere classified: Secondary | ICD-10-CM | POA: Insufficient documentation

## 2019-07-17 DIAGNOSIS — M6281 Muscle weakness (generalized): Secondary | ICD-10-CM | POA: Insufficient documentation

## 2019-07-17 NOTE — Therapy (Signed)
[facility] [address] [place], [address] Phone: [phone]   Fax:  [phone]  Physical Therapy Treatment  Patient Details  Name: [name] MRN: [id number] Date of Birth: May 18, 1950 No data recorded  Encounter Date: 07/17/2019  PT End of Session - 07/17/19 1040    Visit Number  32    Number of Visits  31    Date for PT Re-Evaluation  08/02/19    Authorization Type  medicare    Authorization - Visit Number  5    Authorization - Number of Visits  10    PT Start Time  1030    PT Stop Time  1110    PT Time Calculation (min)  40 min    Activity Tolerance  Patient tolerated treatment well    Behavior During Therapy  [facility] for tasks assessed/performed       Past Medical History:  Diagnosis Date  . Arthritis    back, fingers with joint pain and swelling.  chronic back pain  . Chronic back pain    arthritis   . Diverticulosis   . Elevated cholesterol    takes Niacin daily and Simvastatin  . GERD (gastroesophageal reflux disease) 06/2004   non-specific gastritis on EGD 06/2004  . Headache(784.0)    occasionally  . History of colon polyps 2004, 2009   2004:adenomatous. 2009 hyperplastic.   Marland Kitchen History of hiatal hernia   . Hypertension    takes Tenoretic and Lisinopril daily  . Mucoid cyst of joint 08/2013   right index finger  . Osteopenia 01/2014   T score -1.1 FRAX 14%/0.5%. Stable from prior DEXA  . PONV (postoperative nausea and vomiting)   . Rotator cuff arthropathy    Left  . Seasonal allergies   . Stroke ([place]) 1998   x 2 - mild left-sided weakness  . Urge incontinence   . Uterine prolapse     Past Surgical History:  Procedure Laterality Date  . BACK SURGERY     x 2  . BELPHAROPTOSIS REPAIR Bilateral 12/14/2017  . CATARACT EXTRACTION    . CATARACT EXTRACTION Bilateral 10/2017  . COLONOSCOPY  2004, 2009, 2014  . FINGER SURGERY    . GUM SURGERY    . GYNECOLOGIC CRYOSURGERY    .  HYSTEROSCOPY WITH D & C  12/07/2010   with resection of endometrial polyp  . JOINT REPLACEMENT    . KNEE ARTHROSCOPY Left 2008  . lip biopsy     done at MD office Fri 11/22/13  . MASS EXCISION Right 08/15/2013   Procedure: RIGHT INDEX EXCISION MASS ;  Surgeon: Tennis Must, MD;  Location: [place];  Service: Orthopedics;  Laterality: Right;  . NASAL SEPTUM SURGERY    . OOPHORECTOMY Right 2000  . REVERSE SHOULDER ARTHROPLASTY Left 12/11/2018  . REVERSE SHOULDER ARTHROPLASTY Left 12/11/2018   Procedure: LEFT REVERSE SHOULDER ARTHROPLASTY;  Surgeon: Meredith Pel, MD;  Location: [place];  Service: Orthopedics;  Laterality: Left;  . SHOULDER ARTHROSCOPY WITH OPEN ROTATOR CUFF REPAIR AND DISTAL CLAVICLE ACROMINECTOMY Right 11/26/2013   Procedure: RIGHT SHOULDER ARTHROSCOPY WITH MINI OPEN ROTATOR CUFF REPAIR AND DISTAL CLAVICLE RESECTION, SUBACROMIAL DECOMPRESSION, POSSIBLE [facility] PATCH.;  Surgeon: [name], MD;  Location: [place];  Service: Orthopedics;  Laterality: Right;  . TOTAL KNEE ARTHROPLASTY Right 03/21/2017  . TOTAL KNEE ARTHROPLASTY Right 03/21/2017   Procedure: RIGHT TOTAL KNEE ARTHROPLASTY;  Surgeon: [name], MD;  Location: [place];  Service: Orthopedics;  Laterality: Right;  . TUBAL LIGATION      There were no vitals filed for this visit.  Subjective Assessment - 07/17/19 1033    Subjective  Reports she can tell her shoulder has gotten a little weaker since not being able to lift with the L shoulder, but that motion is staying fairly well. Compliance with HEP. Minimal pain in L shoulder, some soreness.    Pertinent History  Pt is a 69 year old female s/p L reverse total shoulder 12/11/18. Reports she was immobilized in her sling for 2 weeks. After immobilization has been using passive motion machine for flexion. Has been doing AAROM flexion/abd/ER, bicep, shrugs, and forward punches with 1# with HHPT over the past week. MD Marlou Sa advised patient for out  of sling, AAROM, PROM with no lifting of more than 5# until next followup with him (mid Oct). Pt reports only 2/10 at rest, 6/10 with motion. Pain is at the medial upper arm and in the armpit. Pain is worse during the evening after moving it all day. Patient lives with her husband and her 4 dogs, which she walks frequently and cares for. She is R handed.Pt denies N/V, B&B changes, unexplained weight fluctuation, saddle paresthesia, fever, night sweats, or unrelenting night pain at this time.Pt denies N/V, B&B changes, unexplained weight fluctuation, saddle paresthesia, fever, night sweats, or unrelenting night pain at this time.    Limitations  House hold activities;Lifting    How long can you stand comfortably?  unlimited    How long can you walk comfortably?  unlimited    Diagnostic tests  Xrays    Patient Stated Goals  Being able to complete daily tasks    Pain Onset  1 to 4 weeks ago       [place] forward 38mns backward 349ms without resistance per stitch removal - Scaption 2# AW on UEs 3x 10 with min cuing for set up posture with good carry over - Seated [employer] 2# AW on each arm 3x 6/8/8 with cuing for scapular retraction with decent carry over - bilat horizontal abduction with RTB loop at forearm 3x 10 min cuing for cervical posture - Standing ER and IR with looped GTB on forearm 3x 10 each with towel roll, min cuing to prevent trunk rotation compensation with good carry over                        PT Education - 07/17/19 1040    Education Details  therex form/technique    Person(s) Educated  Patient    Methods  Explanation;Demonstration;Verbal cues    Comprehension  Verbalized understanding;Returned demonstration;Verbal cues required       PT Short Term Goals - 06/05/19 1350      PT SHORT TERM GOAL #1   Title  Pt will be independent with HEP in order to improve strength and balance in order to improve function at home    Baseline  01/02/19;  05/07/2019 does them "most of the time"; 06/05/19 completing regularly    Time  4    Period  Weeks    Status  Partially Met        PT Long Term Goals - 07/10/19 1312      PT LONG TERM GOAL #1   Title  Pt will decrease quick DASH score by at least 8% in order to demonstrate clinically significant reduction in disability.  Baseline  02/25/19 56.8%; 05/07/2019 40.9%    Time  8    Period  Weeks    Status  Achieved      PT LONG TERM GOAL #2   Title  Patient will demonstrate full AROM in L shoulder in order to complete household ADLs    Baseline  05/07/2019; flexion:130 ABD 115d IR S2 ER C8; 04/01/19 flex 121d abd 110d IR L PSIS ER C8; 06/05/19 flex 136d abd 118d IR L2 ER C8; 07/10/19 flex 142d abd 114d IR T8 ER C5    Time  8    Period  Weeks    Status  On-going      PT LONG TERM GOAL #3   Title  Pt will decrease worst pain as reported on NPRS by at least 3 points in order to demonstrate clinically significant reduction in pain.    Baseline  02/25/19 2/10 pain with movement; 04/01/19 3/10 with overhead motion; 05/07/2019 4/10 with ADLs; 06/05/19 No pain just soreness    Time  8    Period  Weeks    Status  Achieved      PT LONG TERM GOAL #4   Title  Pt will demonstrate gross periscapular strength of 4/5 in order to safely lift for heavy household chores    Baseline  05/07/2019; R/L shoulder flex 4+/4; ABD* 4/4; ER 5/5; IR 5/4; Pariscap Y: 3/3-gradual release from test position; T: 4/4-, I 5/4; 06/05/19 R/L shoulder flex 5/5; ABD 4+/4+; ER 5/4+; IR 5/4; Pariscap Y: 3+/3-gradual release from test position; T: 4+/4+-, I 5/4+ Latissimus; 07/10/19 R/L Y: 3+/3 T: 4+/4+  I: 5/4+ Latissimus 5/5    Time  8    Period  Weeks    Status  Partially Met      PT LONG TERM GOAL #5   Title  Patient will be able to carry 20lb backpack 256f in order to demonstrate safety with taking her hunting equipment into the woods    Baseline  04/01/19 10# DB farmers carry with fatigue at 1062fand some increased  pain; 05/07/2019 10# DB carry [id number] carry 100 ft at L shoulder) with some fatigue but not increase in pain; Able to complete 100ft10fh 35# and 100ft 71f 20#    Time  6    Period  Weeks    Status  Deferred      PT LONG TERM GOAL #6   Title  Pt will be able to bend over and pick up 15# DB and carry 20 feet in order to be able to carry dogs outside and dog food into house    Baseline  05/07/2019; able to pick up 10# DB off the floor and walk 20 feet with medium difficulty and fatigue; 06/05/19 20# carry 20ft w59ffatigue, and moderate difficulty    Time  6    Period  Weeks    Status  Deferred            Plan - 07/17/19 1059    Clinical Impression Statement  PT continued progression for increased shoulder strength and mobility with good success, precautions post carpel tunnel surgery. PT will continue progression as able.    Personal Factors and Comorbidities  Age;Sex;Behavior Pattern;Comorbidity 1;Fitness    Comorbidities  HTN    Examination-Activity Limitations  Dressing;Carry;Reach Overhead;Lift    Examination-Participation Restrictions  Driving;Community Activity;Cleaning    Stability/Clinical Decision Making  Evolving/Moderate complexity    Clinical Decision Making  Moderate    Rehab Potential  Good    PT Frequency  2x / week    PT Duration  8 weeks    PT Treatment/Interventions  ADLs/Self Care Home Management;Electrical Stimulation;Therapeutic activities;Passive range of motion;Manual techniques;Patient/family education;Therapeutic exercise;Iontophoresis 44m/ml Dexamethasone;Moist Heat;Traction;Cryotherapy;Ultrasound;Functional mobility training;Neuromuscular re-education;Balance training;Dry needling;Joint Manipulations    PT Next Visit Plan  progress strength therex for goals    PT Home Exercise Plan  [id number]   Consulted and Agree with Plan of Care  Patient       Patient will benefit from skilled therapeutic intervention in order to improve the following  deficits and impairments:  Decreased mobility, Decreased endurance, Pain, Postural dysfunction, Impaired UE functional use, Impaired flexibility, Increased fascial restricitons, Decreased strength, Decreased coordination, Decreased range of motion, Improper body mechanics  Visit Diagnosis: Stiffness of left shoulder, not elsewhere classified  Muscle weakness (generalized)  Acute pain of left shoulder     Problem List Patient Active Problem List   Diagnosis Date Noted  . 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 Chronic left shoulder pain 08/23/2018  . Chronic diarrhea 12/07/2014  . Segmental [place] with rectal bleeding [place] 09/12[place]2014  . Routine general medical examination at a health care facility 06/05/2014  . Overweight 06/13/2011  . ABNORMAL CHEST XRAY 09/30/2007  . Hyperlipidemia 02/17/2007  . Essential hypertension 01/08/2007  . Osteoarthritis 01/08/2007   CShelton SilvasPT, DPT CShelton Silvas4/10/2019, 11:06 AM  [facility] [address] [place] [address]Phone: [phone]  Fax:  [phone] Name: NKARILYNN CARRANZAMRN: [id number] of Birth: 305-06-1950

## 2019-07-23 ENCOUNTER — Encounter: Payer: Self-pay | Admitting: Physical Therapy

## 2019-07-23 ENCOUNTER — Other Ambulatory Visit: Payer: Self-pay

## 2019-07-23 ENCOUNTER — Ambulatory Visit: Payer: Medicare Other | Admitting: Physical Therapy

## 2019-07-23 DIAGNOSIS — M25612 Stiffness of left shoulder, not elsewhere classified: Secondary | ICD-10-CM

## 2019-07-23 DIAGNOSIS — R262 Difficulty in walking, not elsewhere classified: Secondary | ICD-10-CM

## 2019-07-23 DIAGNOSIS — M25512 Pain in left shoulder: Secondary | ICD-10-CM | POA: Diagnosis not present

## 2019-07-23 DIAGNOSIS — M6281 Muscle weakness (generalized): Secondary | ICD-10-CM | POA: Diagnosis not present

## 2019-07-23 NOTE — Therapy (Signed)
[facility] [address] [place], [address] Phone: [phone]   Fax:  [phone]  Physical Therapy Treatment  Patient Details  Name: [name] MRN: [id number] Date of Birth: [birth year] No data recorded  Encounter Date: 07/23/2019  PT End of Session - 07/23/19 1414    Visit Number  33    Number of Visits  31    Date for PT Re-Evaluation  08/02/19    Authorization Type  medicare    Authorization - Visit Number  6    Authorization - Number of Visits  10    PT Start Time  0145    PT Stop Time  0225    PT Time Calculation (min)  40 min    Activity Tolerance  Patient tolerated treatment well    Behavior During Therapy  [facility] for tasks assessed/performed       Past Medical History:  Diagnosis Date  . Arthritis    back, fingers with joint pain and swelling.  chronic back pain  . Chronic back pain    arthritis   . Diverticulosis   . Elevated cholesterol    takes Niacin daily and Simvastatin  . GERD (gastroesophageal reflux disease) 06/2004   non-specific gastritis on EGD 06/2004  . Headache(784.0)    occasionally  . History of colon polyps 2004, 2009   2004:adenomatous. 2009 hyperplastic.   Marland Kitchen History of hiatal hernia   . Hypertension    takes Tenoretic and Lisinopril daily  . Mucoid cyst of joint 08/2013   right index finger  . Osteopenia 01/2014   T score -1.1 FRAX 14%/0.5%. Stable from prior DEXA  . PONV (postoperative nausea and vomiting)   . Rotator cuff arthropathy    Left  . Seasonal allergies   . Stroke ([place]) 1998   x 2 - mild left-sided weakness  . Urge incontinence   . Uterine prolapse     Past Surgical History:  Procedure Laterality Date  . BACK SURGERY     x 2  . BELPHAROPTOSIS REPAIR Bilateral 12/14/2017  . CATARACT EXTRACTION    . CATARACT EXTRACTION Bilateral 10/2017  . COLONOSCOPY  2004, 2009, 2014  . FINGER SURGERY    . GUM SURGERY    . GYNECOLOGIC CRYOSURGERY     . HYSTEROSCOPY WITH D & C  12/07/2010   with resection of endometrial polyp  . JOINT REPLACEMENT    . KNEE ARTHROSCOPY Left 2008  . lip biopsy     done at MD office Fri 11/22/13  . MASS EXCISION Right 08/15/2013   Procedure: RIGHT INDEX EXCISION MASS ;  Surgeon: Tennis Must, MD;  Location: [place];  Service: Orthopedics;  Laterality: Right;  . NASAL SEPTUM SURGERY    . OOPHORECTOMY Right 2000  . REVERSE SHOULDER ARTHROPLASTY Left 12/11/2018  . REVERSE SHOULDER ARTHROPLASTY Left 12/11/2018   Procedure: LEFT REVERSE SHOULDER ARTHROPLASTY;  Surgeon: Meredith Pel, MD;  Location: [place];  Service: Orthopedics;  Laterality: Left;  . SHOULDER ARTHROSCOPY WITH OPEN ROTATOR CUFF REPAIR AND DISTAL CLAVICLE ACROMINECTOMY Right 11/26/2013   Procedure: RIGHT SHOULDER ARTHROSCOPY WITH MINI OPEN ROTATOR CUFF REPAIR AND DISTAL CLAVICLE RESECTION, SUBACROMIAL DECOMPRESSION, POSSIBLE [facility] PATCH.;  Surgeon: [name], MD;  Location: [place];  Service: Orthopedics;  Laterality: Right;  . TOTAL KNEE ARTHROPLASTY Right 03/21/2017  . TOTAL KNEE ARTHROPLASTY Right 03/21/2017   Procedure: RIGHT TOTAL KNEE ARTHROPLASTY;  Surgeon: [name], MD;  Location: [place];  Service: Orthopedics;  Laterality: Right;  . TUBAL LIGATION      There were no vitals filed for this visit.  Subjective Assessment - 07/23/19 1348    Subjective  Reports she is not cleared to lift with LUE yet, she returns to surgeon tomorrow. She reports no pain today, only some soreness.    Pertinent History  Pt is a 69 year old female s/p L reverse total shoulder 12/11/18. Reports she was immobilized in her sling for 2 weeks. After immobilization has been using passive motion machine for flexion. Has been doing AAROM flexion/abd/ER, bicep, shrugs, and forward punches with 1# with HHPT over the past week. MD Marlou Sa advised patient for out of sling, AAROM, PROM with no lifting of more than 5# until next followup with him  (mid Oct). Pt reports only 2/10 at rest, 6/10 with motion. Pain is at the medial upper arm and in the armpit. Pain is worse during the evening after moving it all day. Patient lives with her husband and her 4 dogs, which she walks frequently and cares for. She is R handed.Pt denies N/V, B&B changes, unexplained weight fluctuation, saddle paresthesia, fever, night sweats, or unrelenting night pain at this time.Pt denies N/V, B&B changes, unexplained weight fluctuation, saddle paresthesia, fever, night sweats, or unrelenting night pain at this time.    Limitations  House hold activities;Lifting    How long can you sit comfortably?  unlimited    How long can you stand comfortably?  unlimited    How long can you walk comfortably?  unlimited    Diagnostic tests  Xrays    Patient Stated Goals  Being able to complete daily tasks    Pain Onset  1 to 4 weeks ago          Ther-Ex  UBE forward 14mns backward 384ms without resistance per stitch removal - Wall slides x12 with good carry over of technique; with RTB at wrists 3x 10 with min cuing for scapular retraction - Prone W 3x 5 with minimal hand clearance (attempted from Y, unable), cuing for scapular retraction with good carry over  - Prone T 3x 10 with min cuing for technique with scapular retraction with good carry over - Scaption 3# AW on LUE 3x 10/8/7 with min cuing for scapulo-humeral rhythm with good carry over - Seated military press 3# AW on each arm 3x 6 with cuing for scapular retraction with decent carry over                       PT Education - 07/23/19 1414    Education Details  therex form/technique    Person(s) Educated  Patient    Methods  Explanation;Demonstration;Verbal cues    Comprehension  Verbalized understanding;Returned demonstration;Verbal cues required       PT Short Term Goals - 06/05/19 1350      PT SHORT TERM GOAL #1   Title  Pt will be independent with HEP in order to improve strength and  balance in order to improve function at home    Baseline  01/02/19; 05/07/2019 does them "most of the time"; 06/05/19 completing regularly    Time  4    Period  Weeks    Status  Partially Met        PT Long Term Goals - 07/10/19 1312      PT LONG TERM GOAL #1   Title  Pt will decrease quick DASH score by  at least 8% in order to demonstrate clinically significant reduction in disability.    Baseline  02/25/19 56.8%; 05/07/2019 40.9%    Time  8    Period  Weeks    Status  Achieved      PT LONG TERM GOAL #2   Title  Patient will demonstrate full AROM in L shoulder in order to complete household ADLs    Baseline  05/07/2019; flexion:130 ABD 115d IR S2 ER C8; 04/01/19 flex 121d abd 110d IR L PSIS ER C8; 06/05/19 flex 136d abd 118d IR L2 ER C8; 07/10/19 flex 142d abd 114d IR T8 ER C5    Time  8    Period  Weeks    Status  On-going      PT LONG TERM GOAL #3   Title  Pt will decrease worst pain as reported on NPRS by at least 3 points in order to demonstrate clinically significant reduction in pain.    Baseline  02/25/19 2/10 pain with movement; 04/01/19 3/10 with overhead motion; 05/07/2019 4/10 with ADLs; 06/05/19 No pain just soreness    Time  8    Period  Weeks    Status  Achieved      PT LONG TERM GOAL #4   Title  Pt will demonstrate gross periscapular strength of 4/5 in order to safely lift for heavy household chores    Baseline  05/07/2019; R/L shoulder flex 4+/4; ABD* 4/4; ER 5/5; IR 5/4; Pariscap Y: 3/3-gradual release from test position; T: 4/4-, I 5/4; 06/05/19 R/L shoulder flex 5/5; ABD 4+/4+; ER 5/4+; IR 5/4; Pariscap Y: 3+/3-gradual release from test position; T: 4+/4+-, I 5/4+ Latissimus; 07/10/19 R/L Y: 3+/3 T: 4+/4+  I: 5/4+ Latissimus 5/5    Time  8    Period  Weeks    Status  Partially Met      PT LONG TERM GOAL #5   Title  Patient will be able to carry 20lb backpack 272f in order to demonstrate safety with taking her hunting equipment into the woods    Baseline   04/01/19 10# DB farmers carry with fatigue at 1045fand some increased pain; 05/07/2019 10# DB carry [id number] carry 100 ft at L shoulder) with some fatigue but not increase in pain; Able to complete 100ft15fh 35# and 100ft 18f 20#    Time  6    Period  Weeks    Status  Deferred      PT LONG TERM GOAL #6   Title  Pt will be able to bend over and pick up 15# DB and carry 20 feet in order to be able to carry dogs outside and dog food into house    Baseline  05/07/2019; able to pick up 10# DB off the floor and walk 20 feet with medium difficulty and fatigue; 06/05/19 20# carry 20ft w48ffatigue, and moderate difficulty    Time  6    Period  Weeks    Status  Deferred            Plan - 07/23/19 1416    Clinical Impression Statement  PT continued therex progression for increased LUE strength and motion within confines of wrist precautions. As precautions will be lifted next visit, PT will re-assess patient to detemine d/c readiness.    Personal Factors and Comorbidities  Age;Sex;Behavior Pattern;Comorbidity 1;Fitness    Comorbidities  HTN    Examination-Activity Limitations  Dressing;Carry;Reach Overhead;Lift    Examination-Participation Restrictions  Driving;Community Activity;Cleaning  Stability/Clinical Decision Making  Evolving/Moderate complexity    Clinical Decision Making  Moderate    Rehab Potential  Good    PT Frequency  2x / week    PT Duration  8 weeks    PT Treatment/Interventions  ADLs/Self Care Home Management;Electrical Stimulation;Therapeutic activities;Passive range of motion;Manual techniques;Patient/family education;Therapeutic exercise;Iontophoresis 63m/ml Dexamethasone;Moist Heat;Traction;Cryotherapy;Ultrasound;Functional mobility training;Neuromuscular re-education;Balance training;Dry needling;Joint Manipulations    PT Next Visit Plan  progress strength therex for goals    PT Home Exercise Plan  [id number]   Consulted and Agree with Plan of Care  Patient        Patient will benefit from skilled therapeutic intervention in order to improve the following deficits and impairments:  Decreased mobility, Decreased endurance, Pain, Postural dysfunction, Impaired UE functional use, Impaired flexibility, Increased fascial restricitons, Decreased strength, Decreased coordination, Decreased range of motion, Improper body mechanics  Visit Diagnosis: Stiffness of left shoulder, not elsewhere classified  Muscle weakness (generalized)  Acute pain of left shoulder  Difficulty in walking, not elsewhere classified     Problem List Patient Active Problem List   Diagnosis Date Noted  . 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 Chronic left shoulder pain 08/23/2018  . Chronic diarrhea 12/07/2014  . Segmental colitis with rectal bleeding ([place] 09/12/2014  . Routine general medical examination at a health care facility 06/05/2014  . Overweight 06/13/2011  . ABNORMAL CHEST XRAY 09/30/2007  . Hyperlipidemia 02/17/2007  . Essential hypertension 01/08/2007  . Osteoarthritis 01/08/2007   CShelton SilvasPT, DPT CShelton Silvas4/13/2021, 2:20 PM  [facility] [address] [place] [address]Phone: [phone]  Fax:  [phone] Name: NTHEOLA CUELLARMRN: [id number] of Birth: 319-Mar-1952

## 2019-07-24 ENCOUNTER — Ambulatory Visit (INDEPENDENT_AMBULATORY_CARE_PROVIDER_SITE_OTHER): Payer: Medicare Other | Admitting: Orthopedic Surgery

## 2019-07-24 DIAGNOSIS — G5603 Carpal tunnel syndrome, bilateral upper limbs: Secondary | ICD-10-CM

## 2019-07-24 IMAGING — MR MR SHOULDER*R* W/O CM
4 of 5 series · 27 of 40 positions shown · non-contrast
Comparison: MRI right shoulder 11/13/2013.

CLINICAL DATA: History of right shoulder surgery in 2595 for
rotator cuff repair. Pain and decreased strength in the right
shoulder for 6 months.

EXAM:
MRI OF THE RIGHT SHOULDER WITHOUT CONTRAST
TECHNIQUE: Multiplanar, multisequence MR imaging of the shoulder was performed.
No intravenous contrast was administered.

[Series 4: T2 fat-sat · axial · 4.0mm · 0.25mm/px · z∈[+27,+108]mm · 8 of 20 slices shown (1 of 3)]
[im 1/20]
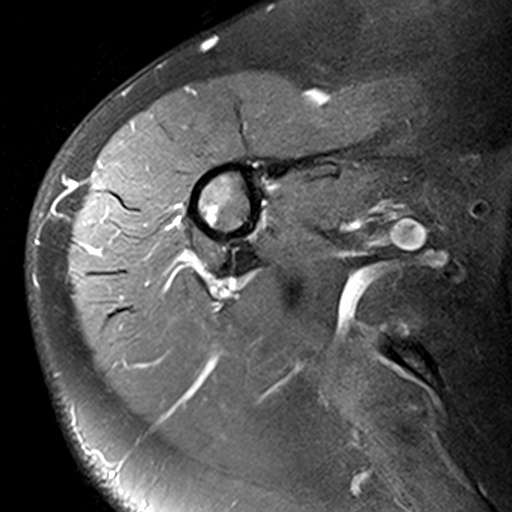
[im 3/20]
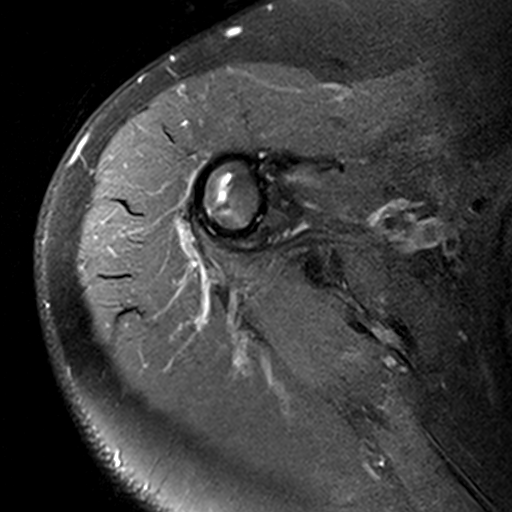
[im 7/20]
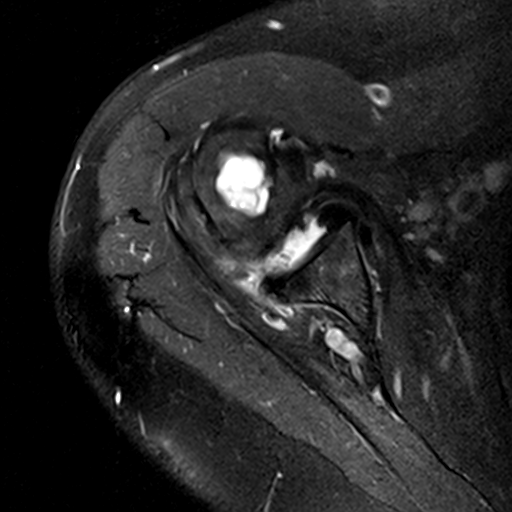
[im 9/20]
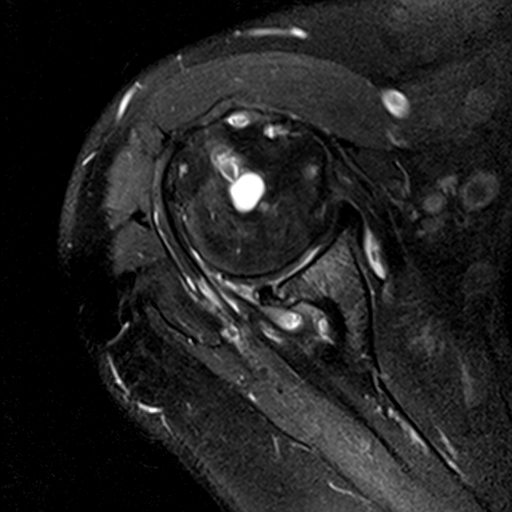
[im 11/20]
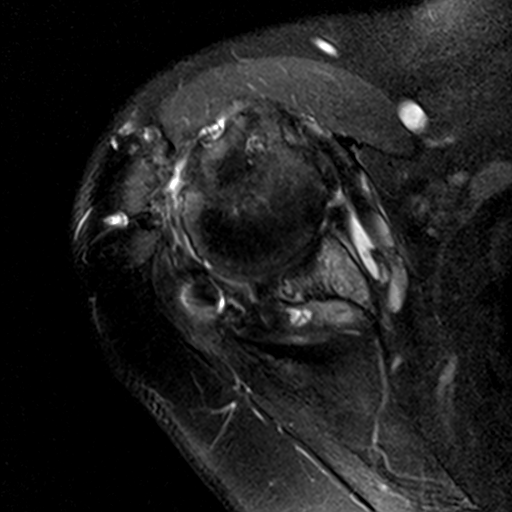
[im 13/20]
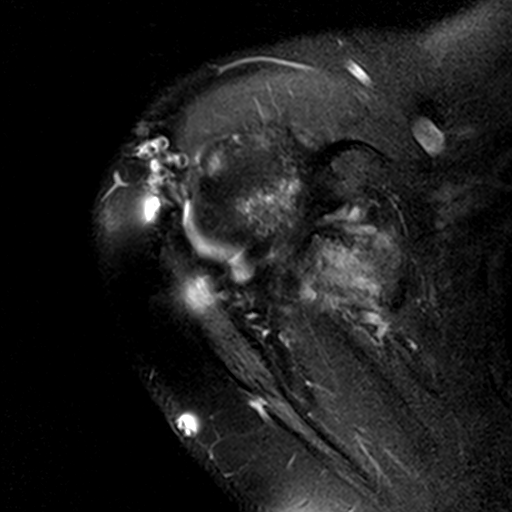
[im 17/20]
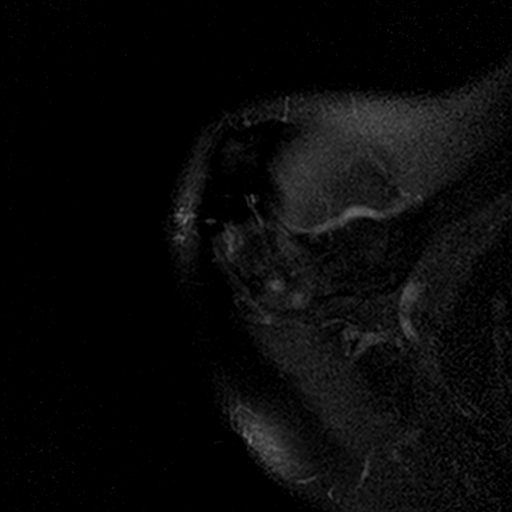
[im 20/20]
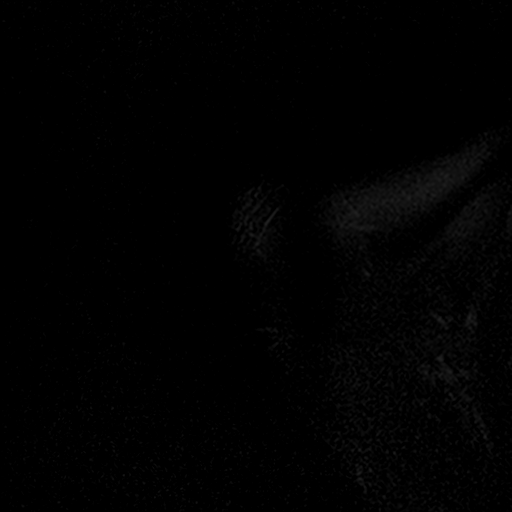

[Series 5: T2 fat-sat · oblique · 4.0mm · 0.55mm/px · 8 of 17 slices shown (2 of 3)]
[im 1/17]
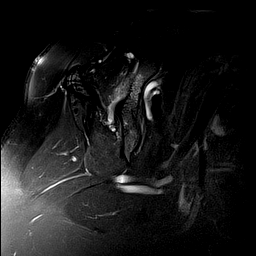
[im 3/17]
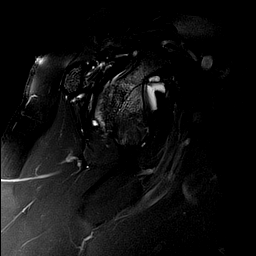
[im 5/17]
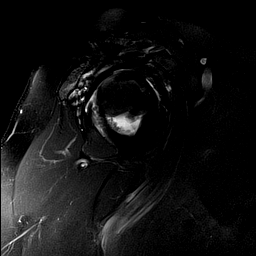
[im 7/17]
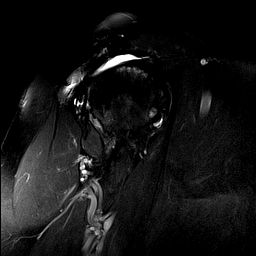
[im 10/17]
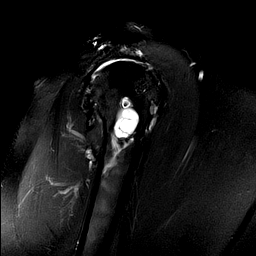
[im 12/17]
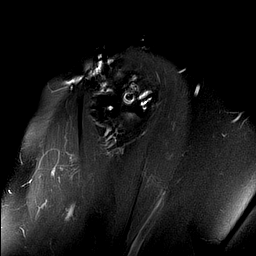
[im 14/17]
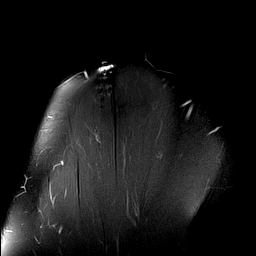
[im 17/17]
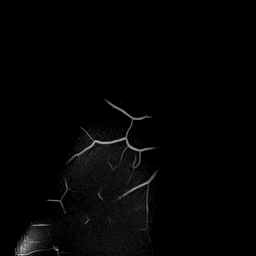

[Series 7: T2 fat-sat · oblique · 4.0mm · 0.55mm/px · 4 of 15 slices shown (3 of 3)]
[im 1/15]
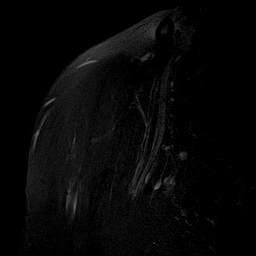
[im 3/15]
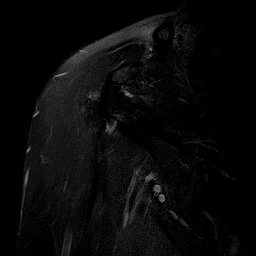
[im 8/15]
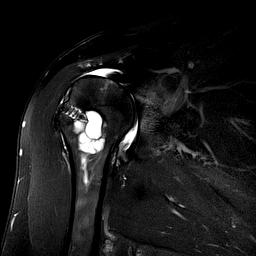
[im 12/15]
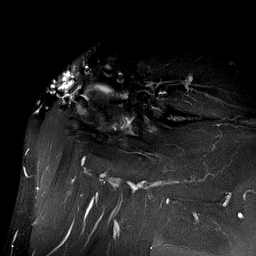

[Series 8: PD · oblique · 4.0mm · 0.27mm/px · 7 of 15 slices shown]
[im 1/15]
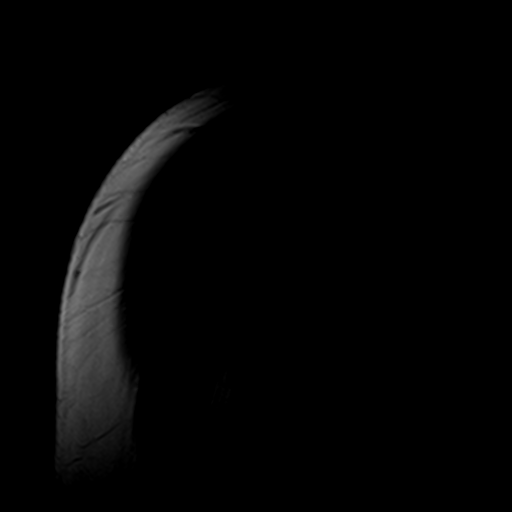
[im 3/15]
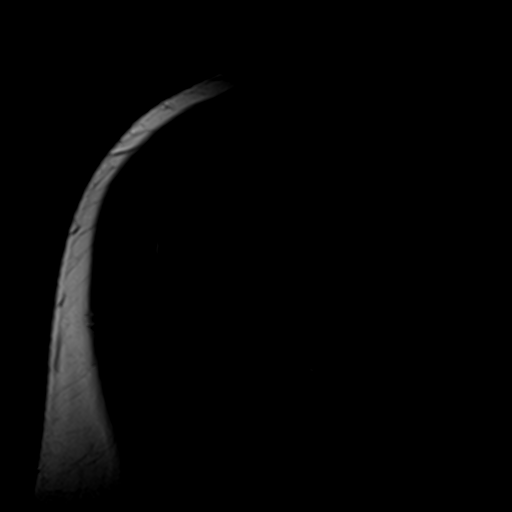
[im 5/15]
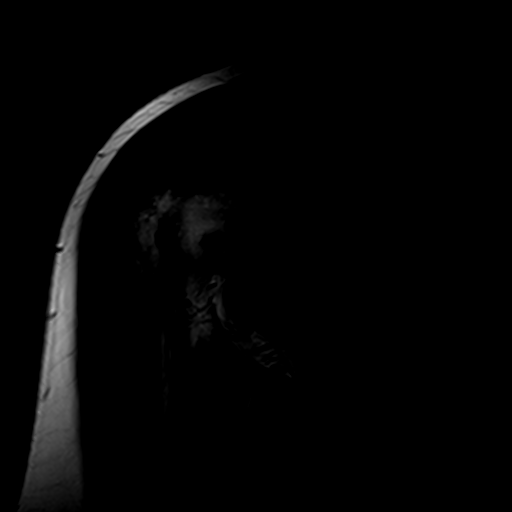
[im 8/15]
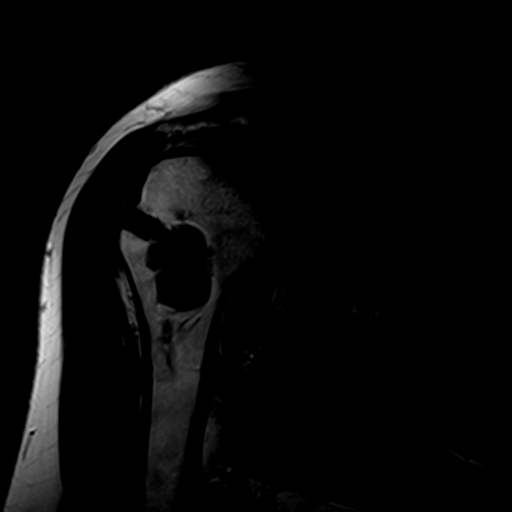
[im 10/15]
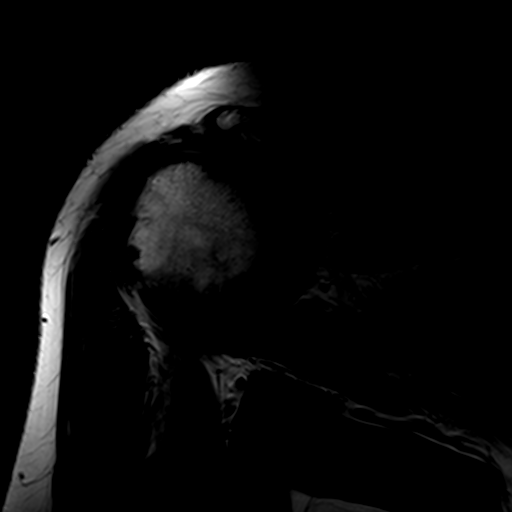
[im 12/15]
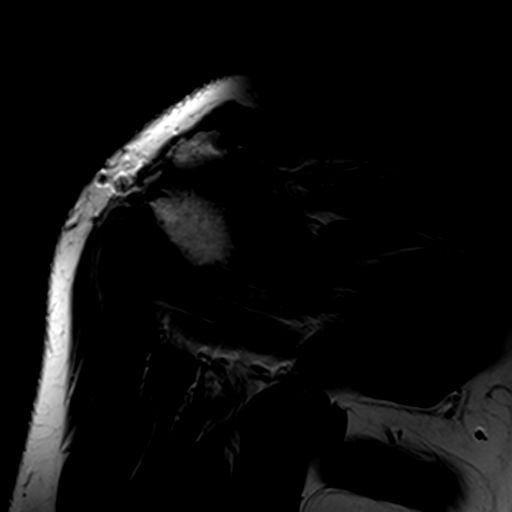
[im 15/15]
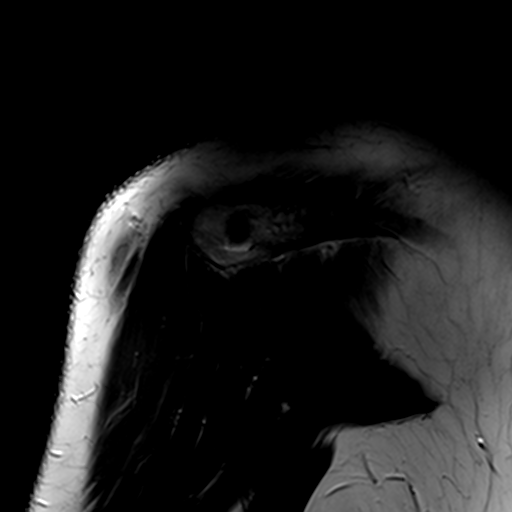

[27 of 40 positions shown; findings below may reference images not displayed]

FINDINGS: Rotator cuff: Soft tissue anchors in the humeral head for rotator
cuff repair are new since the prior examination. The patient has
complete supraspinatus and infraspinatus tendon tears with
retraction medial to the top of the humeral head, 3-4 cm. The
subscapularis and teres minor are intact.

Muscles: Mild to moderate fatty atrophy of the supraspinatus and
infraspinatus is worse in the infraspinatus and new since the prior
exam.

Biceps long head: The patient is status post biceps tenodesis
without evidence of complication.

Acromioclavicular Joint: The joint has been debrided without
evidence complication. Type 1 acromion. The patient is status post
acromioplasty. There is fluid in the subacromial/subdeltoid bursa.

Glenohumeral Joint: There has been marked progression of
glenohumeral degenerative change with extensive cartilage loss
present and subchondral edema in the glenoid.

Labrum:  The superior and posterior labrum are degenerated.

Bones: The patient has a new cyst in the humeral head measuring
approximately 2.4 cm craniocaudal by 1.6 cm transverse by 1.3 cm AP.
A rotator cuff repair soft tissue anchor extends into the lateral
margin of the cyst.

Other: None.
IMPRESSION: Status post rotator cuff repair. The patient has complete
supraspinatus and infraspinatus tendon tears with retraction of 3-4
cm and mild to moderate atrophy of the muscle bellies, worse in the
infraspinatus and new since the prior exam.

Marked worsening of glenohumeral osteoarthritis since the prior MRI.

Status post debridement of the acromioclavicular joint,
acromioplasty and biceps tenodesis without complication.

Cystic lesion in the humeral head is new since the prior MRI and
likely due to fluid tracking along a soft tissue anchor.

## 2019-07-26 ENCOUNTER — Encounter: Payer: Self-pay | Admitting: Orthopedic Surgery

## 2019-07-26 ENCOUNTER — Telehealth: Payer: Self-pay | Admitting: Orthopedic Surgery

## 2019-07-26 NOTE — Progress Notes (Signed)
Office Visit Note   Patient: [name]           Date of Birth: 1950/07/22           MRN: [id number] Visit Date: 07/24/2019 Requested by: Hoyt Koch, MD [address] [place],  [place] [address] PCP: Hoyt Koch, MD  Subjective: Chief Complaint  Patient presents with  . Follow-up    HPI: [name] is now about 4 and half weeks out from left carpal tunnel release.  She is been doing well.  Does not have any more numbness and tingling.  She does have some right sided numbness and tingling with known severe right carpal tunnel syndrome.  She is doing well from her left reverse shoulder replacement.  She is functional enough with her left hand to proceed with surgery.  On exam she has good grip strength on the right with slight flattening of the abductor pollicis brevis.  EPL FPL interosseous strength is intact.  No Tinel's in the cubital tunnel in the elbow.  Impression is right carpal tunnel syndrome.  Plan right carpal tunnel release.  Risk benefits are discussed including but not limited to infection nerve vessel damage incomplete healing as well as potential need for more surgery              ROS: See above  Assessment & Plan: Visit Diagnoses:  1. Bilateral carpal tunnel syndrome     Plan: See above  Follow-Up Instructions: No follow-ups on file.   Orders:  No orders of the defined types were placed in this encounter.  No orders of the defined types were placed in this encounter.     Procedures: No procedures performed   Clinical Data: No additional findings.  Objective: Vital Signs: LMP 02/09/2005   Physical Exam: See above  Ortho Exam: See above  Specialty Comments:  No specialty comments available.  Imaging: No results found.   PMFS History: Patient Active Problem List   Diagnosis Date Noted  . 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 Chronic left shoulder pain 08/23/2018  . Chronic diarrhea 12/07/2014  . Segmental  colitis with rectal bleeding ([place]) 09/12/2014  . Routine general medical examination at a health care facility 06/05/2014  . Overweight 06/13/2011  . ABNORMAL CHEST XRAY 09/30/2007  . Hyperlipidemia 02/17/2007  . Essential hypertension 01/08/2007  . Osteoarthritis 01/08/2007   Past Medical History:  Diagnosis Date  . Arthritis    back, fingers with joint pain and swelling.  chronic back pain  . Chronic back pain    arthritis   . Diverticulosis   . Elevated cholesterol    takes Niacin daily and Simvastatin  . GERD (gastroesophageal reflux disease) 06/2004   non-specific gastritis on EGD 06/2004  . Headache(784.0)    occasionally  . History of colon polyps 2004, 2009   2004:adenomatous. 2009 hyperplastic.   Marland Kitchen History of hiatal hernia   . Hypertension    takes Tenoretic and Lisinopril daily  . Mucoid cyst of joint 08/2013   right index finger  . Osteopenia 01/2014   T score -1.1 FRAX 14%/0.5%. Stable from prior DEXA  . PONV (postoperative nausea and vomiting)   . Rotator cuff arthropathy    Left  . Seasonal allergies   . Stroke ([place]) 1998   x 2 - mild left-sided weakness  . Urge incontinence   . Uterine prolapse     Family History  Problem Relation Age of Onset  . Hypertension Mother   .  Heart disease Mother   . Diabetes Father   . Hypertension Father   . Heart disease Father   . Stroke Father   . Diabetes Maternal Aunt   . Diabetes Paternal Grandmother   . Hypertension Paternal Grandfather   . Stroke Paternal Grandfather     Past Surgical History:  Procedure Laterality Date  . BACK SURGERY     x 2  . BELPHAROPTOSIS REPAIR Bilateral 12/14/2017  . CATARACT EXTRACTION    . CATARACT EXTRACTION Bilateral 10/2017  . COLONOSCOPY  2004, 2009, 2014  . FINGER SURGERY    . GUM SURGERY    . GYNECOLOGIC CRYOSURGERY    . HYSTEROSCOPY WITH D & C  12/07/2010   with resection of endometrial polyp  . JOINT REPLACEMENT    . KNEE ARTHROSCOPY Left 2008  . lip biopsy     done  at MD office Fri 11/22/13  . MASS EXCISION Right 08/15/2013   Procedure: RIGHT INDEX EXCISION MASS ;  Surgeon: Tennis Must, MD;  Location: [place];  Service: Orthopedics;  Laterality: Right;  . NASAL SEPTUM SURGERY    . OOPHORECTOMY Right 2000  . REVERSE SHOULDER ARTHROPLASTY Left 12/11/2018  . REVERSE SHOULDER ARTHROPLASTY Left 12/11/2018   Procedure: LEFT REVERSE SHOULDER ARTHROPLASTY;  Surgeon: Meredith Pel, MD;  Location: [place];  Service: Orthopedics;  Laterality: Left;  . SHOULDER ARTHROSCOPY WITH OPEN ROTATOR CUFF REPAIR AND DISTAL CLAVICLE ACROMINECTOMY Right 11/26/2013   Procedure: RIGHT SHOULDER ARTHROSCOPY WITH MINI OPEN ROTATOR CUFF REPAIR AND DISTAL CLAVICLE RESECTION, SUBACROMIAL DECOMPRESSION, POSSIBLE [facility] PATCH.;  Surgeon: [name], MD;  Location: [place];  Service: Orthopedics;  Laterality: Right;  . TOTAL KNEE ARTHROPLASTY Right 03/21/2017  . TOTAL KNEE ARTHROPLASTY Right 03/21/2017   Procedure: RIGHT TOTAL KNEE ARTHROPLASTY;  Surgeon: [name], MD;  Location: [place];  Service: Orthopedics;  Laterality: Right;  . TUBAL LIGATION     Social History   Occupational History  . Occupation: Animal nutritionist    Comment: Disability/Retired  Tobacco Use  . Smoking status: Passive Smoke Exposure - Never Smoker  . Smokeless tobacco: Never Used  Substance and Sexual Activity  . Alcohol use: Not Currently    Alcohol/week: 0.0 standard drinks    Comment: about 2-3 times a year  . Drug use: No  . Sexual activity: Yes    Partners: Male    Birth control/protection: Surgical, Post-menopausal    Comment: BTL-1st intercourse 69 yo-More than 5 partners

## 2019-07-26 NOTE — Telephone Encounter (Signed)
I s/w patient and advised

## 2019-07-26 NOTE — Telephone Encounter (Signed)
Pls advise thanks.

## 2019-07-26 NOTE — Telephone Encounter (Signed)
Pt called wanting to know if there was physical therapy she could do in regards to her carpal tunnel.   [phone]

## 2019-07-31 ENCOUNTER — Encounter: Payer: Medicare Other | Admitting: Physical Therapy

## 2019-08-01 ENCOUNTER — Other Ambulatory Visit: Payer: Self-pay

## 2019-08-01 ENCOUNTER — Encounter: Payer: Self-pay | Admitting: Physical Therapy

## 2019-08-01 ENCOUNTER — Ambulatory Visit: Payer: Medicare Other | Admitting: Physical Therapy

## 2019-08-01 DIAGNOSIS — M25612 Stiffness of left shoulder, not elsewhere classified: Secondary | ICD-10-CM

## 2019-08-01 DIAGNOSIS — M6281 Muscle weakness (generalized): Secondary | ICD-10-CM | POA: Diagnosis not present

## 2019-08-01 DIAGNOSIS — R262 Difficulty in walking, not elsewhere classified: Secondary | ICD-10-CM | POA: Diagnosis not present

## 2019-08-01 DIAGNOSIS — M25512 Pain in left shoulder: Secondary | ICD-10-CM | POA: Diagnosis not present

## 2019-08-01 NOTE — Therapy (Signed)
[facility] [address], [place], [address] Phone: [phone]   Fax:  [phone]  Physical Therapy Treatment/Discharge Summary   Patient Details  Name: [name] MRN: [id number] Date of Birth: [birth year] No data recorded  Encounter Date: 08/01/2019  PT End of Session - 08/01/19 1319    Visit Number  34    Number of Visits  78    Date for PT Re-Evaluation  08/02/19    Authorization Type  medicare    Authorization - Visit Number  7    Authorization - Number of Visits  10    PT Start Time  0100    PT Stop Time  0145    PT Time Calculation (min)  45 min    Activity Tolerance  Patient tolerated treatment well    Behavior During Therapy  [facility] for tasks assessed/performed       Past Medical History:  Diagnosis Date  . Arthritis    back, fingers with joint pain and swelling.  chronic back pain  . Chronic back pain    arthritis   . Diverticulosis   . Elevated cholesterol    takes Niacin daily and Simvastatin  . GERD (gastroesophageal reflux disease) 06/2004   non-specific gastritis on EGD 06/2004  . Headache(784.0)    occasionally  . History of colon polyps 2004, 2009   2004:adenomatous. 2009 hyperplastic.   Marland Kitchen History of hiatal hernia   . Hypertension    takes Tenoretic and Lisinopril daily  . Mucoid cyst of joint 08/2013   right index finger  . Osteopenia 01/2014   T score -1.1 FRAX 14%/0.5%. Stable from prior DEXA  . PONV (postoperative nausea and vomiting)   . Rotator cuff arthropathy    Left  . Seasonal allergies   . Stroke ([place]) 1998   x 2 - mild left-sided weakness  . Urge incontinence   . Uterine prolapse     Past Surgical History:  Procedure Laterality Date  . BACK SURGERY     x 2  . BELPHAROPTOSIS REPAIR Bilateral 12/14/2017  . CATARACT EXTRACTION    . CATARACT EXTRACTION Bilateral 10/2017  . COLONOSCOPY  2004, 2009, 2014  . FINGER SURGERY    . GUM SURGERY    .  GYNECOLOGIC CRYOSURGERY    . HYSTEROSCOPY WITH D & C  12/07/2010   with resection of endometrial polyp  . JOINT REPLACEMENT    . KNEE ARTHROSCOPY Left 2008  . lip biopsy     done at MD office Fri 11/22/13  . MASS EXCISION Right 08/15/2013   Procedure: RIGHT INDEX EXCISION MASS ;  Surgeon: Tennis Must, MD;  Location: [place];  Service: Orthopedics;  Laterality: Right;  . NASAL SEPTUM SURGERY    . OOPHORECTOMY Right 2000  . REVERSE SHOULDER ARTHROPLASTY Left 12/11/2018  . REVERSE SHOULDER ARTHROPLASTY Left 12/11/2018   Procedure: LEFT REVERSE SHOULDER ARTHROPLASTY;  Surgeon: Meredith Pel, MD;  Location: [place];  Service: Orthopedics;  Laterality: Left;  . SHOULDER ARTHROSCOPY WITH OPEN ROTATOR CUFF REPAIR AND DISTAL CLAVICLE ACROMINECTOMY Right 11/26/2013   Procedure: RIGHT SHOULDER ARTHROSCOPY WITH MINI OPEN ROTATOR CUFF REPAIR AND DISTAL CLAVICLE RESECTION, SUBACROMIAL DECOMPRESSION, POSSIBLE [facility] PATCH.;  Surgeon: [name], MD;  Location: [place];  Service: Orthopedics;  Laterality: Right;  . TOTAL KNEE ARTHROPLASTY Right 03/21/2017  . TOTAL KNEE ARTHROPLASTY Right 03/21/2017   Procedure: RIGHT TOTAL KNEE ARTHROPLASTY;  Surgeon: Joni Fears  W, MD;  Location: [place];  Service: Orthopedics;  Laterality: Right;  . TUBAL LIGATION      There were no vitals filed for this visit.  Subjective Assessment - 08/01/19 1318    Subjective  Reports minimal pain, other than L wrist. Thinks she has all the motion she is going to at this point.    Pertinent History  Pt is a 69 year old female s/p L reverse total shoulder 12/11/18. Reports she was immobilized in her sling for 2 weeks. After immobilization has been using passive motion machine for flexion. Has been doing AAROM flexion/abd/ER, bicep, shrugs, and forward punches with 1# with HHPT over the past week. MD Marlou Sa advised patient for out of sling, AAROM, PROM with no lifting of more than 5# until next followup with him  (mid Oct). Pt reports only 2/10 at rest, 6/10 with motion. Pain is at the medial upper arm and in the armpit. Pain is worse during the evening after moving it all day. Patient lives with her husband and her 4 dogs, which she walks frequently and cares for. She is R handed.Pt denies N/V, B&B changes, unexplained weight fluctuation, saddle paresthesia, fever, night sweats, or unrelenting night pain at this time.Pt denies N/V, B&B changes, unexplained weight fluctuation, saddle paresthesia, fever, night sweats, or unrelenting night pain at this time.    Limitations  House hold activities;Lifting    How long can you sit comfortably?  unlimited    How long can you stand comfortably?  unlimited    How long can you walk comfortably?  unlimited    Diagnostic tests  Xrays    Patient Stated Goals  Being able to complete daily tasks    Pain Onset  1 to 4 weeks ago       Ther-Ex UBE 61mns forward/323ms backward ROM and MMT testing 20# farmers carry (10# each hand) 20072fT led patient through HEP for maintaining strength gains made through PT. Patient is able to demonstrate a set of the following with good technique, very minimal cuing needed for corrections. Patient verbalizes understanding of all parameters for strengthening, when/how to increase/decrease intensity, and consistency needed.  Prone Scapular Retraction Y - 1 x daily - 2-3 x weekly - 3 sets - 5-10 reps  Standing High Row with Resistance - 1 x daily - 2-3 x weekly - 3 sets - 5-10 reps  Seated Overhead Press with Dumbbells - 1 x daily - 2-3 x weekly - 3 sets - 5-10 reps  Shoulder External Rotation and Scapular Retraction with Resistance - 1 x daily - 2-3 x weekly - 3 sets - 5-10 reps  Scaption with Dumbbells - 1 x daily - 2-3 x weekly - 3 sets - 5-10 reps                         PT Education - 08/01/19 1319    Education Details  d/c recommendations, HEP review    Person(s) Educated  Patient    Methods   Explanation;Demonstration;Verbal cues    Comprehension  Verbalized understanding;Returned demonstration;Verbal cues required       PT Short Term Goals - 06/05/19 1350      PT SHORT TERM GOAL #1   Title  Pt will be independent with HEP in order to improve strength and balance in order to improve function at home    Baseline  01/02/19; 05/07/2019 does them "most of the time"; 06/05/19 completing regularly  Time  4    Period  Weeks    Status  Partially Met        PT Long Term Goals - 08/01/19 1307      PT LONG TERM GOAL #1   Title  Pt will decrease quick DASH score by at least 8% in order to demonstrate clinically significant reduction in disability.    Baseline  02/25/19 56.8%; 05/07/2019 40.9%    Time  8    Period  Weeks    Status  Achieved      PT LONG TERM GOAL #2   Title  Patient will demonstrate full AROM in L shoulder in order to complete household ADLs    Baseline  08/01/2019; flexion:160 ABD 161d IR T10 ER C8; 05/07/2019; flexion:130 ABD 115d IR S2 ER C8; 04/01/19 flex 121d abd 110d IR L PSIS ER C8; 06/05/19 flex 136d abd 118d IR L2 ER C8; 07/10/19 flex 142d abd 114d IR T8 ER C5    Time  8    Period  Weeks    Status  Achieved      PT LONG TERM GOAL #3   Title  Pt will decrease worst pain as reported on NPRS by at least 3 points in order to demonstrate clinically significant reduction in pain.    Baseline  02/25/19 2/10 pain with movement; 04/01/19 3/10 with overhead motion; 05/07/2019 4/10 with ADLs; 06/05/19 No pain just soreness    Time  8    Period  Weeks    Status  Achieved      PT LONG TERM GOAL #4   Title  Pt will demonstrate gross periscapular strength of 4/5 in order to safely lift for heavy household chores    Baseline  07/10/19 R/L Y: 3+/3 T: 4+/4+  I: 5/4+ Latissimus 5/5; 08/01/19 R/L Y: 4-/3+ T: 4+/4+  I: 5/5 Latissimus 5/5    Time  8    Period  Weeks    Status  Partially Met      PT LONG TERM GOAL #5   Title  Patient will be able to carry 20lb backpack  236f in order to demonstrate safety with taking her hunting equipment into the woods    Baseline  04/01/19 10# DB farmers carry with fatigue at 1061fand some increased pain; 05/07/2019 10# DB carry [id number] carry 100 ft at L shoulder) with some fatigue but not increase in pain; Able to complete 100ft38fh 35# and 100ft 12f 20#; 08/01/19 able to complete with no increased pain, only fatigue    Time  6    Period  Weeks    Status  Achieved      PT LONG TERM GOAL #6   Title  Pt will be able to bend over and pick up 15# DB and carry 20 feet in order to be able to carry dogs outside and dog food into house    Baseline  05/07/2019; able to pick up 10# DB off the floor and walk 20 feet with medium difficulty and fatigue; 06/05/19 20# carry 20ft w74ffatigue, and moderate difficulty; 08/01/19 able to carry 20# 200ft   30fe  6    Period  Weeks    Status  Achieved            Plan - 08/01/19 1412    Clinical Impression Statement  PT reassessed goals this session where patient has met all goals and is ready to d/c PT. PT reviewed HEP with patient  for maintainance of gains made through PT. Patient is able to complete all therex with proper technique and verbalizes understanding of all parameters given. Patient given clinic contact info should any further questions/concerns arise.    Personal Factors and Comorbidities  Age;Sex;Behavior Pattern;Comorbidity 1;Fitness    Comorbidities  HTN    Examination-Activity Limitations  Dressing;Carry;Reach Overhead;Lift    Examination-Participation Restrictions  Driving;Community Activity;Cleaning    Stability/Clinical Decision Making  Evolving/Moderate complexity    Clinical Decision Making  Moderate    Rehab Potential  Good    PT Frequency  2x / week    PT Duration  8 weeks    PT Treatment/Interventions  ADLs/Self Care Home Management;Electrical Stimulation;Therapeutic activities;Passive range of motion;Manual techniques;Patient/family  education;Therapeutic exercise;Iontophoresis 53m/ml Dexamethasone;Moist Heat;Traction;Cryotherapy;Ultrasound;Functional mobility training;Neuromuscular re-education;Balance training;Dry needling;Joint Manipulations    PT Next Visit Plan  progress strength therex for goals    PT Home Exercise Plan  [id number]   Consulted and Agree with Plan of Care  Patient       Patient will benefit from skilled therapeutic intervention in order to improve the following deficits and impairments:  Decreased mobility, Decreased endurance, Pain, Postural dysfunction, Impaired UE functional use, Impaired flexibility, Increased fascial restricitons, Decreased strength, Decreased coordination, Decreased range of motion, Improper body mechanics  Visit Diagnosis: Stiffness of left shoulder, not elsewhere classified  Muscle weakness (generalized)  Acute pain of left shoulder     Problem List Patient Active Problem List   Diagnosis Date Noted  . 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 Chronic left shoulder pain 08/23/2018  . Chronic diarrhea 12/07/2014  . [place] 09[place]12[place]  . Routine general medical examination at a health care facility 06/05/2014  . Overweight 06/13/2011  . ABNORMAL CHEST XRAY 09/30/2007  . Hyperlipidemia 02/17/2007  . Essential hypertension 01/08/2007  . Osteoarthritis 01/08/2007   CShelton SilvasPT, DPT CShelton Silvas4/22/2021, 2:25 PM  [facility] [address] [place] [id number]: [phone]  Fax:  [phone] Name: Stacy DOMKEMRN: [id number] of Birth: [birth year]

## 2019-08-19 NOTE — Progress Notes (Signed)
[name] - 69 y.o. female MRN [id number]  Date of birth: [birth year]  Office Visit Note: Visit Date: 05/02/2019 PCP: Hoyt Koch, MD Referred by: Hoyt Koch, *  Subjective: Chief Complaint  Patient presents with  . Neck - Pain  . Right Shoulder - Pain  . Left Shoulder - Pain   HPI:  [name] is a 69 y.o. female who comes in today for planned midline C7-T1 cervical epidural steroid injection with fluoroscopic guidance.  The patient has failed conservative care including home exercise, medications, time and activity modification.  This injection will be diagnostic and hopefully therapeutic.  Please see requesting physician notes for further details and justification.   ROS Otherwise per HPI.  Assessment & Plan: Visit Diagnoses:  1. Spinal stenosis of cervical region     Plan: No additional findings.   Meds & Orders:  Meds ordered this encounter  Medications  . methylPREDNISolone acetate (DEPO-MEDROL) injection 40 mg    Orders Placed This Encounter  Procedures  . XR C-ARM NO REPORT  . Epidural Steroid injection    Follow-up: Return if symptoms worsen or fail to improve.   Procedures: No procedures performed  Cervical Epidural Steroid Injection - Interlaminar Approach with Fluoroscopic Guidance  Patient: [name]      Date of Birth: 08-Dec-1950 MRN: [id number] PCP: Hoyt Koch, MD      Visit Date: 05/02/2019   Universal Protocol:    Date/Time: 05/10/216:16 AM  Consent Given By: the patient  Position: PRONE  Additional Comments: Vital signs were monitored before and after the procedure. Patient was prepped and draped in the usual sterile fashion. The correct patient, procedure, and site was verified.   Injection Procedure Details:  Procedure Site One Meds Administered:  Meds ordered this encounter  Medications  . methylPREDNISolone acetate (DEPO-MEDROL) injection 40 mg     Laterality:  Left  Location/Site: C7-T1  Needle size: 20 G  Needle type: Touhy  Needle Placement: Paramedian epidural space  Findings:  -Comments: Excellent flow of contrast into the epidural space.  Procedure Details: Using a paramedian approach from the side mentioned above, the region overlying the inferior lamina was localized under fluoroscopic visualization and the soft tissues overlying this structure were infiltrated with 4 ml. of 1% Lidocaine without Epinephrine. A # 20 gauge, Tuohy needle was inserted into the epidural space using a paramedian approach.  The epidural space was localized using loss of resistance along with lateral and contralateral oblique bi-planar fluoroscopic views.  After negative aspirate for air, blood, and CSF, a 2 ml. volume of Isovue-250 was injected into the epidural space and the flow of contrast was observed. Radiographs were obtained for documentation purposes.   The injectate was administered into the level noted above.  Additional Comments:  The patient tolerated the procedure well Dressing: 2 x 2 sterile gauze and Band-Aid    Post-procedure details: Patient was observed during the procedure. Post-procedure instructions were reviewed.  Patient left the clinic in stable condition.     Clinical History: MRI CERVICAL SPINE WITHOUT CONTRAST  TECHNIQUE: Multiplanar, multisequence MR imaging of the cervical spine was performed. No intravenous contrast was administered.  COMPARISON:  Radiography 03/06/2019  FINDINGS: Alignment: Normal except for 2 mm degenerative anterolisthesis C4-5.  Vertebrae: No fracture or primary bone lesion.  Cord: No cord compression or primary cord lesion.  Posterior Fossa, vertebral arteries, paraspinal tissues: Normal  Disc levels:  No abnormality at the foramen magnum or C1-2.  C2-3: No disc pathology. Bilateral facet osteoarthritis which could be painful. No compressive stenosis.  C3-4: Mild bulging  of the disc. Bilateral facet osteoarthritis. No canal stenosis. Mild bilateral foraminal stenosis.  C4-5: Degenerative spondylosis with endplate osteophytes and bulging of the disc. 2 mm degenerative anterolisthesis. Bilateral facet degeneration and hypertrophy. Canal narrowing with effacement of the subarachnoid space surrounding the cord but no cord compression. Bilateral foraminal stenosis that could compress either C5 nerve, worse on the right than left.  C5-6: Spondylosis with endplate osteophytes and bulging of the disc. Narrowing of the ventral subarachnoid space but no compression of the cord. Bilateral foraminal narrowing could affect either C6 nerve.  C6-7: Endplate osteophytes and bulging of the disc. Narrowing of the ventral subarachnoid space but no compression of the cord. Bilateral foraminal narrowing could affect either C7 nerve.  C7-T1: Bulging of the disc. Mild facet degeneration. No compressive canal or foraminal narrowing.  IMPRESSION: C2-3: Bilateral facet osteoarthritis which could be painful. No neural compression.  C3-4: Bilateral facet osteoarthritis with mild foraminal narrowing but no gross neural compression.  C4-5: Facet osteoarthritis with 2 mm of anterolisthesis. Spondylosis. Foraminal narrowing left worse than right that could affect either C5 nerve. Canal narrowing with effacement of the subarachnoid space but no demonstrable cord compression.  C5-6: Spondylosis with bilateral foraminal narrowing that could affect either C6 nerve.  C6-7: Spondylosis with bilateral foraminal narrowing that could affect either C7 nerve.   Electronically Signed   By: Nelson Chimes M.D.   On: 03/17/2019 08:53     Objective:  VS:  HT:    WT:   BMI:     BP:137/88  HR:96bpm  TEMP: ( )  RESP:  Physical Exam Vitals and nursing note reviewed.  Constitutional:      General: She is not in acute distress.    Appearance: Normal appearance. She is  not ill-appearing.  HENT:     Head: Normocephalic and atraumatic.     Right Ear: External ear normal.     Left Ear: External ear normal.  Eyes:     Extraocular Movements: Extraocular movements intact.  Cardiovascular:     Rate and Rhythm: Normal rate.     Pulses: Normal pulses.  Musculoskeletal:     Cervical back: Tenderness present. No rigidity.     Right lower leg: No edema.     Left lower leg: No edema.     Comments: Patient has good strength in the upper extremities including 5 out of 5 strength in wrist extension long finger flexion and APB.  There is no atrophy of the hands intrinsically.  There is a negative Hoffmann's test.   Lymphadenopathy:     Cervical: No cervical adenopathy.  Skin:    Findings: No erythema, lesion or rash.  Neurological:     General: No focal deficit present.     Mental Status: She is alert and oriented to person, place, and time.     Sensory: No sensory deficit.     Motor: No weakness or abnormal muscle tone.     Coordination: Coordination normal.  Psychiatric:        Mood and Affect: Mood normal.        Behavior: Behavior normal.      Imaging: No results found.

## 2019-08-19 NOTE — Procedures (Signed)
Cervical Epidural Steroid Injection - Interlaminar Approach with Fluoroscopic Guidance  Patient: [name]      Date of Birth: 1951-01-28 MRN: [id number] PCP: Hoyt Koch, MD      Visit Date: 05/02/2019   Universal Protocol:    Date/Time: 05/10/216:16 AM  Consent Given By: the patient  Position: PRONE  Additional Comments: Vital signs were monitored before and after the procedure. Patient was prepped and draped in the usual sterile fashion. The correct patient, procedure, and site was verified.   Injection Procedure Details:  Procedure Site One Meds Administered:  Meds ordered this encounter  Medications  . methylPREDNISolone acetate (DEPO-MEDROL) injection 40 mg     Laterality: Left  Location/Site: C7-T1  Needle size: 20 G  Needle type: Touhy  Needle Placement: Paramedian epidural space  Findings:  -Comments: Excellent flow of contrast into the epidural space.  Procedure Details: Using a paramedian approach from the side mentioned above, the region overlying the inferior lamina was localized under fluoroscopic visualization and the soft tissues overlying this structure were infiltrated with 4 ml. of 1% Lidocaine without Epinephrine. A # 20 gauge, Tuohy needle was inserted into the epidural space using a paramedian approach.  The epidural space was localized using loss of resistance along with lateral and contralateral oblique bi-planar fluoroscopic views.  After negative aspirate for air, blood, and CSF, a 2 ml. volume of Isovue-250 was injected into the epidural space and the flow of contrast was observed. Radiographs were obtained for documentation purposes.   The injectate was administered into the level noted above.  Additional Comments:  The patient tolerated the procedure well Dressing: 2 x 2 sterile gauze and Band-Aid    Post-procedure details: Patient was observed during the procedure. Post-procedure instructions were  reviewed.  Patient left the clinic in stable condition.

## 2019-08-26 ENCOUNTER — Other Ambulatory Visit: Payer: Self-pay | Admitting: Surgical

## 2019-08-26 ENCOUNTER — Telehealth: Payer: Self-pay | Admitting: Orthopedic Surgery

## 2019-08-26 ENCOUNTER — Other Ambulatory Visit: Payer: Self-pay | Admitting: Orthopedic Surgery

## 2019-08-26 DIAGNOSIS — G5601 Carpal tunnel syndrome, right upper limb: Secondary | ICD-10-CM

## 2019-08-26 HISTORY — PX: CARPAL TUNNEL RELEASE: SHX101

## 2019-08-26 MED ORDER — OXYCODONE HCL 5 MG PO CAPS: 5.0000 mg | ORAL_CAPSULE | Freq: Four times a day (QID) | ORAL | 0 refills | Status: DC | PRN

## 2019-08-26 MED ORDER — OXYCODONE HCL 5 MG PO TABS: 5.0000 mg | ORAL_TABLET | Freq: Four times a day (QID) | ORAL | 0 refills | Status: DC | PRN

## 2019-08-26 NOTE — Telephone Encounter (Signed)
Joe from [facility] called to get the RX for the pain medication (Oxycodone) changed from Capsules to tablets.  They do not stock the capsules.  CB#[phone].  Thank you.

## 2019-08-26 NOTE — Telephone Encounter (Signed)
done

## 2019-08-26 NOTE — Telephone Encounter (Signed)
Please advise. Thanks.  

## 2019-08-26 NOTE — Telephone Encounter (Signed)
Please change. Thanks.

## 2019-08-30 ENCOUNTER — Other Ambulatory Visit: Payer: Self-pay | Admitting: Internal Medicine

## 2019-09-02 ENCOUNTER — Inpatient Hospital Stay: Payer: Medicare Other | Admitting: Orthopedic Surgery

## 2019-09-05 ENCOUNTER — Other Ambulatory Visit: Payer: Self-pay

## 2019-09-05 ENCOUNTER — Ambulatory Visit (INDEPENDENT_AMBULATORY_CARE_PROVIDER_SITE_OTHER): Payer: Medicare Other | Admitting: Orthopedic Surgery

## 2019-09-05 DIAGNOSIS — G5603 Carpal tunnel syndrome, bilateral upper limbs: Secondary | ICD-10-CM

## 2019-09-06 ENCOUNTER — Encounter: Payer: Self-pay | Admitting: Orthopedic Surgery

## 2019-09-06 NOTE — Progress Notes (Signed)
Post-Op Visit Note   Patient: [name]           Date of Birth: [birth year]           MRN: [id number] Visit Date: 09/05/2019 PCP: Hoyt Koch, MD   Assessment & Plan:  Chief Complaint:  Chief Complaint  Patient presents with  . Right Hand - Routine Post Op   Visit Diagnoses:  1. Bilateral carpal tunnel syndrome     Plan: Bunny is a patient with right carpal tunnel syndrome status post release 08/26/2019.  Incision intact.  Sutures removed.  Abductor pollicis brevis function intact.  Continue with light activity in the right hand follow-up in 4 weeks for clinical recheck.  She can see Lurena Joiner or me at that time.  Follow-Up Instructions: Return in about 4 weeks (around 10/03/2019).   Orders:  No orders of the defined types were placed in this encounter.  No orders of the defined types were placed in this encounter.   Imaging: No results found.  PMFS History: Patient Active Problem List   Diagnosis Date Noted  . 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 Chronic left shoulder pain 08/23/2018  . Chronic diarrhea 12/07/2014  . Segmental colitis with rectal bleeding ([place]) 09/12/2014  . Routine general medical examination at a health care facility 06/05/2014  . Overweight 06/13/2011  . ABNORMAL CHEST XRAY 09/30/2007  . Hyperlipidemia 02/17/2007  . Essential hypertension 01/08/2007  . Osteoarthritis 01/08/2007   Past Medical History:  Diagnosis Date  . Arthritis    back, fingers with joint pain and swelling.  chronic back pain  . Chronic back pain    arthritis   . Diverticulosis   . Elevated cholesterol    takes Niacin daily and Simvastatin  . GERD (gastroesophageal reflux disease) 06/2004   non-specific gastritis on EGD 06/2004  . Headache(784.0)    occasionally  . History of colon polyps 2004, 2009   2004:adenomatous. 2009 hyperplastic.   Marland Kitchen History of hiatal hernia   . Hypertension    takes Tenoretic and Lisinopril daily  . Mucoid cyst of  joint 08/2013   right index finger  . Osteopenia 01/2014   T score -1.1 FRAX 14%/0.5%. Stable from prior DEXA  . PONV (postoperative nausea and vomiting)   . Rotator cuff arthropathy    Left  . Seasonal allergies   . Stroke ([place]) 1998   x 2 - mild left-sided weakness  . Urge incontinence   . Uterine prolapse     Family History  Problem Relation Age of Onset  . Hypertension Mother   . Heart disease Mother   . Diabetes Father   . Hypertension Father   . Heart disease Father   . Stroke Father   . Diabetes Maternal Aunt   . Diabetes Paternal Grandmother   . Hypertension Paternal Grandfather   . Stroke Paternal Grandfather     Past Surgical History:  Procedure Laterality Date  . BACK SURGERY     x 2  . BELPHAROPTOSIS REPAIR Bilateral 12/14/2017  . CATARACT EXTRACTION    . CATARACT EXTRACTION Bilateral 10/2017  . COLONOSCOPY  2004, 2009, 2014  . FINGER SURGERY    . GUM SURGERY    . GYNECOLOGIC CRYOSURGERY    . HYSTEROSCOPY WITH D & C  12/07/2010   with resection of endometrial polyp  . JOINT REPLACEMENT    . KNEE ARTHROSCOPY Left 2008  . lip biopsy     done at MD office Fri 11/22/13  .  MASS EXCISION Right 08/15/2013   Procedure: RIGHT INDEX EXCISION MASS ;  Surgeon: Tennis Must, MD;  Location: [place];  Service: Orthopedics;  Laterality: Right;  . NASAL SEPTUM SURGERY    . OOPHORECTOMY Right 2000  . REVERSE SHOULDER ARTHROPLASTY Left 12/11/2018  . REVERSE SHOULDER ARTHROPLASTY Left 12/11/2018   Procedure: LEFT REVERSE SHOULDER ARTHROPLASTY;  Surgeon: Meredith Pel, MD;  Location: [place];  Service: Orthopedics;  Laterality: Left;  . SHOULDER ARTHROSCOPY WITH OPEN ROTATOR CUFF REPAIR AND DISTAL CLAVICLE ACROMINECTOMY Right 11/26/2013   Procedure: RIGHT SHOULDER ARTHROSCOPY WITH MINI OPEN ROTATOR CUFF REPAIR AND DISTAL CLAVICLE RESECTION, SUBACROMIAL DECOMPRESSION, POSSIBLE [facility] PATCH.;  Surgeon: [name], MD;  Location: [place];  Service:  Orthopedics;  Laterality: Right;  . TOTAL KNEE ARTHROPLASTY Right 03/21/2017  . TOTAL KNEE ARTHROPLASTY Right 03/21/2017   Procedure: RIGHT TOTAL KNEE ARTHROPLASTY;  Surgeon: [name], MD;  Location: [place];  Service: Orthopedics;  Laterality: Right;  . TUBAL LIGATION     Social History   Occupational History  . Occupation: Animal nutritionist    Comment: Disability/Retired  Tobacco Use  . Smoking status: Passive Smoke Exposure - Never Smoker  . Smokeless tobacco: Never Used  Substance and Sexual Activity  . Alcohol use: Not Currently    Alcohol/week: 0.0 standard drinks    Comment: about 2-3 times a year  . Drug use: No  . Sexual activity: Yes    Partners: Male    Birth control/protection: Surgical, Post-menopausal    Comment: BTL-1st intercourse 69 yo-More than 5 partners

## 2019-09-20 ENCOUNTER — Other Ambulatory Visit: Payer: Self-pay | Admitting: Internal Medicine

## 2019-10-02 ENCOUNTER — Ambulatory Visit (INDEPENDENT_AMBULATORY_CARE_PROVIDER_SITE_OTHER): Payer: Medicare Other | Admitting: Orthopedic Surgery

## 2019-10-02 DIAGNOSIS — G5603 Carpal tunnel syndrome, bilateral upper limbs: Secondary | ICD-10-CM

## 2019-10-03 ENCOUNTER — Encounter: Payer: Self-pay | Admitting: Orthopedic Surgery

## 2019-10-03 NOTE — Progress Notes (Signed)
Post-Op Visit Note   Patient: [name]           Date of Birth: December 28, 1950           MRN: [id number] Visit Date: 10/02/2019 PCP: Hoyt Koch, MD   Assessment & Plan:  Chief Complaint:  Chief Complaint  Patient presents with   Follow-up    s/p CTR   Visit Diagnoses:  1. Bilateral carpal tunnel syndrome     Plan: Zamira is now about 5 weeks out right carpal tunnel release.  She is doing well with no problems.  Numbness and tingling has resolved.  Still has slightly diminished grip strength which is expected.  Continue to work on range of motion of the wrist and grip strengthening exercises.  In general she is doing well from bilateral carpal tunnel release.  Follow-up with me as needed.  Follow-Up Instructions: Return if symptoms worsen or fail to improve.   Orders:  No orders of the defined types were placed in this encounter.  No orders of the defined types were placed in this encounter.   Imaging: No results found.  PMFS History: Patient Active Problem List   Diagnosis Date Noted   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   Chronic left shoulder pain 08/23/2018   Chronic diarrhea 12/07/2014   Segmental colitis with rectal bleeding ([place]) 09/12/2014   Routine general medical examination at a health care facility 06/05/2014   Overweight 06/13/2011   ABNORMAL CHEST XRAY 09/30/2007   Hyperlipidemia 02/17/2007   Essential hypertension 01/08/2007   Osteoarthritis 01/08/2007   Past Medical History:  Diagnosis Date   Arthritis    back, fingers with joint pain and swelling.  chronic back pain   Chronic back pain    arthritis    Diverticulosis    Elevated cholesterol    takes Niacin daily and Simvastatin   GERD (gastroesophageal reflux disease) 06/2004   non-specific gastritis on EGD 06/2004   Headache(784.0)    occasionally   History of colon polyps 2004, 2009   2004:adenomatous. 2009 hyperplastic.    History of hiatal hernia     Hypertension    takes Tenoretic and Lisinopril daily   Mucoid cyst of joint 08/2013   right index finger   Osteopenia 01/2014   T score -1.1 FRAX 14%/0.5%. Stable from prior DEXA   PONV (postoperative nausea and vomiting)    Rotator cuff arthropathy    Left   Seasonal allergies    Stroke ([place]) 1998   x 2 - mild left-sided weakness   Urge incontinence    Uterine prolapse     Family History  Problem Relation Age of Onset   Hypertension Mother    Heart disease Mother    Diabetes Father    Hypertension Father    Heart disease Father    Stroke Father    Diabetes Maternal Aunt    Diabetes Paternal Grandmother    Hypertension Paternal Grandfather    Stroke Paternal Grandfather     Past Surgical History:  Procedure Laterality Date   BACK SURGERY     x 2   BELPHAROPTOSIS REPAIR Bilateral 12/14/2017   CATARACT EXTRACTION     CATARACT EXTRACTION Bilateral 10/2017   COLONOSCOPY  2004, 2009, 2014   FINGER SURGERY     GUM SURGERY     GYNECOLOGIC CRYOSURGERY     HYSTEROSCOPY WITH D & C  12/07/2010   with resection of endometrial polyp   JOINT REPLACEMENT  KNEE ARTHROSCOPY Left 2008   lip biopsy     done at MD office Fri 11/22/13   MASS EXCISION Right 08/15/2013   Procedure: RIGHT INDEX EXCISION MASS ;  Surgeon: Tennis Must, MD;  Location: [place];  Service: Orthopedics;  Laterality: Right;   NASAL SEPTUM SURGERY     OOPHORECTOMY Right 2000   REVERSE SHOULDER ARTHROPLASTY Left 12/11/2018   REVERSE SHOULDER ARTHROPLASTY Left 12/11/2018   Procedure: LEFT REVERSE SHOULDER ARTHROPLASTY;  Surgeon: Meredith Pel, MD;  Location: [place];  Service: Orthopedics;  Laterality: Left;   SHOULDER ARTHROSCOPY WITH OPEN ROTATOR CUFF REPAIR AND DISTAL CLAVICLE ACROMINECTOMY Right 11/26/2013   Procedure: RIGHT SHOULDER ARTHROSCOPY WITH MINI OPEN ROTATOR CUFF REPAIR AND DISTAL CLAVICLE RESECTION, SUBACROMIAL DECOMPRESSION, POSSIBLE  [facility] PATCH.;  Surgeon: [name], MD;  Location: [place];  Service: Orthopedics;  Laterality: Right;   TOTAL KNEE ARTHROPLASTY Right 03/21/2017   TOTAL KNEE ARTHROPLASTY Right 03/21/2017   Procedure: RIGHT TOTAL KNEE ARTHROPLASTY;  Surgeon: [name], MD;  Location: [place];  Service: Orthopedics;  Laterality: Right;   TUBAL LIGATION     Social History   Occupational History   Occupation: Marine scientist - Programmer, applications    Comment: Disability/Retired  Tobacco Use   Smoking status: Passive Smoke Exposure - Never Smoker   Smokeless tobacco: Never Used  Scientific laboratory technician Use: Never used  Substance and Sexual Activity   Alcohol use: Not Currently    Alcohol/week: 0.0 standard drinks    Comment: about 2-3 times a year   Drug use: No   Sexual activity: Yes    Partners: Male    Birth control/protection: Surgical, Post-menopausal    Comment: BTL-1st intercourse 69 yo-More than 5 partners

## 2019-10-28 ENCOUNTER — Encounter: Payer: Self-pay | Admitting: Gastroenterology

## 2019-11-06 DIAGNOSIS — Z1231 Encounter for screening mammogram for malignant neoplasm of breast: Secondary | ICD-10-CM | POA: Diagnosis not present

## 2019-11-06 LAB — HM MAMMOGRAPHY

## 2019-11-14 DIAGNOSIS — N6042 Mammary duct ectasia of left breast: Secondary | ICD-10-CM | POA: Diagnosis not present

## 2019-11-18 ENCOUNTER — Encounter: Payer: Self-pay | Admitting: Internal Medicine

## 2019-12-02 ENCOUNTER — Encounter: Payer: Self-pay | Admitting: Family

## 2019-12-02 ENCOUNTER — Ambulatory Visit (INDEPENDENT_AMBULATORY_CARE_PROVIDER_SITE_OTHER): Payer: Medicare Other | Admitting: Family

## 2019-12-02 ENCOUNTER — Other Ambulatory Visit: Payer: Self-pay

## 2019-12-02 VITALS — BP 120/70 | HR 82 | Temp 98.5°F | Wt 180.0 lb

## 2019-12-02 DIAGNOSIS — R634 Abnormal weight loss: Secondary | ICD-10-CM

## 2019-12-02 DIAGNOSIS — R197 Diarrhea, unspecified: Secondary | ICD-10-CM

## 2019-12-02 DIAGNOSIS — D649 Anemia, unspecified: Secondary | ICD-10-CM | POA: Diagnosis not present

## 2019-12-02 DIAGNOSIS — R195 Other fecal abnormalities: Secondary | ICD-10-CM | POA: Diagnosis not present

## 2019-12-02 DIAGNOSIS — K921 Melena: Secondary | ICD-10-CM

## 2019-12-02 NOTE — Progress Notes (Signed)
[name] is a 69 y.o. female with the following history as recorded in EpicCare:  Patient Active Problem List   Diagnosis Date Noted  . 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 Chronic left shoulder pain 08/23/2018  . Chronic diarrhea 12/07/2014  . Segmental colitis with rectal bleeding ([place]) 09/12/2014  . Routine general medical examination at a health care facility 06/05/2014  . Overweight 06/13/2011  . ABNORMAL CHEST XRAY 09/30/2007  . Hyperlipidemia 02/17/2007  . Essential hypertension 01/08/2007  . Osteoarthritis 01/08/2007    Current Outpatient Medications  Medication Sig Dispense Refill  . atenolol-chlorthalidone (TENORETIC) 50-25 MG tablet Take 1 tablet by mouth daily. Annual appt due in Oct must see provider for future refills 90 tablet 0  . diphenoxylate-atropine (LOMOTIL) 2.5-0.025 MG tablet Take 1 tablet by mouth 4 (four) times daily as needed for diarrhea or loose stools. TAKE 1 TABLET BY MOUTH 4 TIMES DAILY AS NEEDED FOR DIARRHEA OR LOOSE STOOLS 60 tablet 3  . hyoscyamine (LEVSIN SL) 0.125 MG SL tablet Take one tablet by mouth before meals three times a day (Patient taking differently: Take 0.125 mg by mouth 3 (three) times daily as needed for cramping. Take one tablet by mouth before meals three times a day as needed) 90 tablet 11  . ketotifen (ZADITOR) 0.025 % ophthalmic solution Place 2 drops into both eyes as needed (allergies).    Marland Kitchen KLOR-CON M20 20 MEQ tablet TAKE 1 TABLET TWICE A DAY 180 tablet 3  . lisinopril (ZESTRIL) 2.5 MG tablet Take 1 tablet (2.5 mg total) by mouth daily. Annual appt due in Oct must see provider for future refills 90 tablet 0  . methocarbamol (ROBAXIN) 500 MG tablet TAKE 1 TABLET BY MOUTH EVERY 8 HOURS AS NEEDED FOR MUSCLE SPASMS 30 tablet 0  . Multiple Vitamin (MULTIVITAMIN) tablet Take 1 tablet by mouth daily.      Marland Kitchen oxyCODONE (OXY IR/ROXICODONE) 5 MG immediate release tablet Take 1 tablet (5 mg total) by mouth every 6 (six) hours as  needed for severe pain. 20 tablet 0  . oxyCODONE-acetaminophen (PERCOCET/ROXICET) 5-325 MG tablet Take 1 tablet by mouth every 6 (six) hours as needed for severe pain. 20 tablet 0  . simvastatin (ZOCOR) 20 MG tablet TAKE 1 TABLET DAILY 90 tablet 1  . amoxicillin (AMOXIL) 500 MG tablet Take 2 grams (4 tablets) one hour prior to procedure. (Patient not taking: Reported on 12/02/2019) 12 tablet 0  . Ascorbic Acid (VITAMIN C) 1000 MG tablet Take 1,000 mg by mouth daily. (Patient not taking: Reported on 12/02/2019)    . aspirin 81 MG EC tablet Take 81 mg by mouth 2 (two) times daily.  (Patient not taking: Reported on 12/02/2019)    . b complex vitamins tablet Take 1 tablet by mouth daily. (Patient not taking: Reported on 12/02/2019)    . Calcium Carbonate-Vitamin D (CALCIUM + D PO) Take 1 tablet by mouth daily.  (Patient not taking: Reported on 12/02/2019)    . Cholecalciferol (D 5000) 5000 units capsule Take 5,000 Units by mouth daily. (Patient not taking: Reported on 12/02/2019)    . Chromium 1 MG CAPS Take 1 mg by mouth daily.  (Patient not taking: Reported on 12/02/2019)    . Coenzyme Q10 (COQ10) 100 MG CAPS Take 100 mg by mouth every evening. (Patient not taking: Reported on 12/02/2019)    . folic acid (FOLVITE) 701 MCG tablet Take 800 mcg by mouth every evening. (Patient not taking: Reported on 12/02/2019)    .  Magnesium 250 MG TABS Take 250 mg by mouth 2 (two) times daily. (Patient not taking: Reported on 12/02/2019)    . Melatonin 3 MG TABS Take 3 mg by mouth at bedtime. (Patient not taking: Reported on 12/02/2019)    . Probiotic Product (PROBIOTIC DAILY PO) Take 1 capsule by mouth daily. (Patient not taking: Reported on 12/02/2019)    . vitamin E 400 UNIT capsule Take 400 Units by mouth daily. (Patient not taking: Reported on 12/02/2019)    . Zinc Sulfate (ZINC 15 PO) Take 1 tablet by mouth daily. Liquid Blend 10 drops equal 15 mg (Patient not taking: Reported on 12/02/2019)     No current  facility-administered medications for this visit.    Allergies: Eggs or egg-derived products, Etodolac, Fish-derived products, Omeprazole, Pineapple, and Gluten meal  Past Medical History:  Diagnosis Date  . Arthritis    back, fingers with joint pain and swelling.  chronic back pain  . Chronic back pain    arthritis   . Diverticulosis   . Elevated cholesterol    takes Niacin daily and Simvastatin  . GERD (gastroesophageal reflux disease) 06/2004   non-specific gastritis on EGD 06/2004  . Headache(784.0)    occasionally  . History of colon polyps 2004, 2009   2004:adenomatous. 2009 hyperplastic.   Marland Kitchen History of hiatal hernia   . Hypertension    takes Tenoretic and Lisinopril daily  . Mucoid cyst of joint 08/2013   right index finger  . Osteopenia 01/2014   T score -1.1 FRAX 14%/0.5%. Stable from prior DEXA  . PONV (postoperative nausea and vomiting)   . Rotator cuff arthropathy    Left  . Seasonal allergies   . Stroke ([place]) 1998   x 2 - mild left-sided weakness  . Urge incontinence   . Uterine prolapse     Past Surgical History:  Procedure Laterality Date  . BACK SURGERY     x 2  . BELPHAROPTOSIS REPAIR Bilateral 12/14/2017  . CATARACT EXTRACTION    . CATARACT EXTRACTION Bilateral 10/2017  . COLONOSCOPY  2004, 2009, 2014  . FINGER SURGERY    . GUM SURGERY    . GYNECOLOGIC CRYOSURGERY    . HYSTEROSCOPY WITH D & C  12/07/2010   with resection of endometrial polyp  . JOINT REPLACEMENT    . KNEE ARTHROSCOPY Left 2008  . lip biopsy     done at MD office Fri 11/22/13  . MASS EXCISION Right 08/15/2013   Procedure: RIGHT INDEX EXCISION MASS ;  Surgeon: Tennis Must, MD;  Location: [place];  Service: Orthopedics;  Laterality: Right;  . NASAL SEPTUM SURGERY    . OOPHORECTOMY Right 2000  . REVERSE SHOULDER ARTHROPLASTY Left 12/11/2018  . REVERSE SHOULDER ARTHROPLASTY Left 12/11/2018   Procedure: LEFT REVERSE SHOULDER ARTHROPLASTY;  Surgeon: Meredith Pel,  MD;  Location: [place];  Service: Orthopedics;  Laterality: Left;  . SHOULDER ARTHROSCOPY WITH OPEN ROTATOR CUFF REPAIR AND DISTAL CLAVICLE ACROMINECTOMY Right 11/26/2013   Procedure: RIGHT SHOULDER ARTHROSCOPY WITH MINI OPEN ROTATOR CUFF REPAIR AND DISTAL CLAVICLE RESECTION, SUBACROMIAL DECOMPRESSION, POSSIBLE [facility] PATCH.;  Surgeon: [name], MD;  Location: [place];  Service: Orthopedics;  Laterality: Right;  . TOTAL KNEE ARTHROPLASTY Right 03/21/2017  . TOTAL KNEE ARTHROPLASTY Right 03/21/2017   Procedure: RIGHT TOTAL KNEE ARTHROPLASTY;  Surgeon: [name], MD;  Location: [place];  Service: Orthopedics;  Laterality: Right;  . TUBAL LIGATION      Family History  Problem Relation Age of Onset  . Hypertension Mother   . Heart disease Mother   . Diabetes Father   . Hypertension Father   . Heart disease Father   . Stroke Father   . Diabetes Maternal Aunt   . Diabetes Paternal Grandmother   . Hypertension Paternal Grandfather   . Stroke Paternal Grandfather     Social History   Tobacco Use  . Smoking status: Passive Smoke Exposure - Never Smoker  . Smokeless tobacco: Never Used  Substance Use Topics  . Alcohol use: Not Currently    Alcohol/week: 0.0 standard drinks    Comment: about 2-3 times a year    Subjective:  Per patient, she has been having problems with diarrhea x 6 months; Notes that symptoms worsened in the past week; she was having abdominal pain/ loose stools- "diarrhea I couldn't control"; Notes that her stool was "very dark" last Tuesday/ Wednesday; History of colitis- has been admitted with bleeding colitis in the past; overdue for colonoscopy; scheduled to see GI in 2 weeks;  Has lost 7 pounds since October 2020- per patient, unplanned;  She does have an active prescription for Lomotil and has been having to use more frequently;    Objective:  Vitals:   12/02/19 1605  BP: 120/70  Pulse: 82  Temp: 98.5 F (36.9 C)  TempSrc: Oral  SpO2: 97%   Weight: 180 lb (81.6 kg)    General: Well developed, well nourished, in no acute distress  Skin : Warm and dry.  Head: Normocephalic and atraumatic  Lungs: Respirations unlabored; clear to auscultation bilaterally without wheeze, rales, rhonchi  CVS exam: normal rate and regular rhythm.  Abdomen: Soft; nontender; nondistended; normoactive bowel sounds; no masses or hepatosplenomegaly  Neurologic: Alert and oriented; speech intact; face symmetrical; moves all extremities well; CNII-XII intact without focal deficit  Assessment:  1. Dark stools   2. Anemia, unspecified type   3. Diarrhea, unspecified type   4. Melena   5. Unexplained weight loss     Plan:  Will update abd/ pelvic CT; check CBC, CMP, amylase, lipase, ferritin today; keep planned follow-up with GI- may need to try and have patient seen sooner;  This visit occurred during the SARS-CoV-2 public health emergency.  Safety protocols were in place, including screening questions prior to the visit, additional usage of staff PPE, and extensive cleaning of exam room while observing appropriate contact time as indicated for disinfecting solutions.     No follow-ups on file.  Orders Placed This Encounter  Procedures  . CT Abdomen Pelvis W Contrast    Standing Status:   Future    Standing Expiration Date:   12/01/2020    Order Specific Question:   If indicated for the ordered procedure, I authorize the administration of contrast media per Radiology protocol    Answer:   Yes    Order Specific Question:   Preferred imaging location?    Answer:   [id number]    Order Specific Question:   Is Oral Contrast requested for this exam?    Answer:   Yes, Per Radiology protocol    Order Specific Question:   Radiology Contrast Protocol - do NOT remove file path    Answer:   [email or web address]  . CBC with Differential/Platelet    Standing Status:   Future    Number of Occurrences:   1    Standing Expiration  Date:   12/01/2020  . Ferritin    Standing Status:  Future    Number of Occurrences:   1    Standing Expiration Date:   12/01/2020  . Amylase  . Lipase  . Amylase    Standing Status:   Future    Number of Occurrences:   1    Standing Expiration Date:   12/01/2020  . Lipase    Standing Status:   Future    Number of Occurrences:   1    Standing Expiration Date:   12/01/2020    Requested Prescriptions    No prescriptions requested or ordered in this encounter

## 2019-12-03 LAB — CBC WITH DIFFERENTIAL/PLATELET
Absolute Monocytes: 1109 cells/uL — ABNORMAL HIGH (ref 200–950)
Basophils Absolute: 69 cells/uL (ref 0–200)
Basophils Relative: 0.7 %
Eosinophils Absolute: 257 cells/uL (ref 15–500)
Eosinophils Relative: 2.6 %
HCT: 40.5 % (ref 35.0–45.0)
Hemoglobin: 13 g/dL (ref 11.7–15.5)
Lymphs Abs: 2950 cells/uL (ref 850–3900)
MCH: 26.4 pg — ABNORMAL LOW (ref 27.0–33.0)
MCHC: 32.1 g/dL (ref 32.0–36.0)
MCV: 82.2 fL (ref 80.0–100.0)
MPV: 11 fL (ref 7.5–12.5)
Monocytes Relative: 11.2 %
Neutro Abs: 5514 cells/uL (ref 1500–7800)
Neutrophils Relative %: 55.7 %
Platelets: 260 10*3/uL (ref 140–400)
RBC: 4.93 10*6/uL (ref 3.80–5.10)
RDW: 14.2 % (ref 11.0–15.0)
Total Lymphocyte: 29.8 %
WBC: 9.9 10*3/uL (ref 3.8–10.8)

## 2019-12-03 LAB — FERRITIN: Ferritin: 12 ng/mL — ABNORMAL LOW (ref 16–288)

## 2019-12-03 LAB — AMYLASE: Amylase: 57 U/L (ref 21–101)

## 2019-12-03 LAB — LIPASE: Lipase: 58 U/L (ref 7–60)

## 2019-12-12 ENCOUNTER — Other Ambulatory Visit: Payer: Self-pay

## 2019-12-12 ENCOUNTER — Other Ambulatory Visit: Payer: Self-pay | Admitting: Internal Medicine

## 2019-12-12 ENCOUNTER — Ambulatory Visit
Admission: RE | Admit: 2019-12-12 | Discharge: 2019-12-12 | Disposition: A | Discharge status: Home / Self Care | Payer: Medicare Other | Source: Ambulatory Visit | Attending: Family | Admitting: Family

## 2019-12-12 DIAGNOSIS — K921 Melena: Secondary | ICD-10-CM

## 2019-12-12 MED ORDER — IOPAMIDOL (ISOVUE-300) INJECTION 61%
100.0000 mL | Freq: Once | INTRAVENOUS | Status: AC | PRN
  Administered 2019-12-12: 100 mL via INTRAVENOUS

## 2019-12-25 ENCOUNTER — Encounter: Payer: Self-pay | Admitting: Gastroenterology

## 2019-12-25 ENCOUNTER — Other Ambulatory Visit: Payer: Self-pay | Admitting: Internal Medicine

## 2019-12-25 ENCOUNTER — Ambulatory Visit (INDEPENDENT_AMBULATORY_CARE_PROVIDER_SITE_OTHER): Payer: Medicare Other | Admitting: Gastroenterology

## 2019-12-25 ENCOUNTER — Other Ambulatory Visit (INDEPENDENT_AMBULATORY_CARE_PROVIDER_SITE_OTHER): Payer: Medicare Other

## 2019-12-25 VITALS — BP 104/80 | HR 71 | Ht 62.0 in | Wt 175.0 lb

## 2019-12-25 DIAGNOSIS — R197 Diarrhea, unspecified: Secondary | ICD-10-CM

## 2019-12-25 DIAGNOSIS — Z86010 Personal history of colonic polyps: Secondary | ICD-10-CM | POA: Diagnosis not present

## 2019-12-25 DIAGNOSIS — D509 Iron deficiency anemia, unspecified: Secondary | ICD-10-CM

## 2019-12-25 LAB — TSH: TSH: 1.89 u[IU]/mL (ref 0.35–4.50)

## 2019-12-25 LAB — IGA: IgA: 358 mg/dL (ref 68–378)

## 2019-12-25 MED ORDER — NA SULFATE-K SULFATE-MG SULF 17.5-3.13-1.6 GM/177ML PO SOLN: 1.0000 | Freq: Once | ORAL | 0 refills | Status: AC

## 2019-12-25 NOTE — Patient Instructions (Signed)
Your provider has requested that you go to the basement level for lab work before leaving today. Press "B" on the elevator. The lab is located at the first door on the left as you exit the elevator.  You have been scheduled for an endoscopy and colonoscopy. Please follow the written instructions given to you at your visit today. Please pick up your prep supplies at the pharmacy within the next 1-3 days. If you use inhalers (even only as needed), please bring them with you on the day of your procedure.  Due to recent changes in healthcare laws, you may see the results of your imaging and laboratory studies on MyChart before your provider has had a chance to review them.  We understand that in some cases there may be results that are confusing or concerning to you. Not all laboratory results come back in the same time frame and the provider may be waiting for multiple results in order to interpret others.  Please give [place] 48 hours in order for your provider to thoroughly review all the results before contacting the office for clarification of your results.   Thank you for choosing me and [facility].  Pricilla Riffle. Dagoberto Ligas., MD., Marval Regal

## 2019-12-25 NOTE — Progress Notes (Signed)
History of Present Illness: This is a 69 year old female referred by Hoyt Koch, * MD for the evaluation of worsening of chronic diarrhea, IDA.  She has a long history of diarrhea occurring 3-4 times per day.  She has had flares where she has had up to 8 or 10 bowel movements in the time.  She recently had 1 of these flares which was associated with occasional episodes of fecal incontinence.  Recent blood work showed a decrease in hemoglobin from 14.2 1-year ago to 13.0.  Ferritin was low at 12.  No recent antibiotic usage.Denies weight loss, abdominal pain, constipation, diarrhea, change in stool caliber, melena, hematochezia, nausea, vomiting, dysphagia, reflux symptoms, chest pain.    Allergies  Allergen Reactions  . Eggs Or Egg-Derived Products Other (See Comments)    GI UPSET, JOINT PAIN, DIFF. SWALLOWING  . Etodolac Other (See Comments)    ABD. CRAMPING  . Fish-Derived Products Other (See Comments)    GI UPSET, JOINT PAIN, DIFF. SWALLOWING   . Omeprazole Other (See Comments)    ABD. CRAMPING  . Pineapple Other (See Comments)    GI UPSET, JOINT PAIN, DIFF. SWALLOWING  . Gluten Meal Diarrhea    GI upset   Outpatient Medications Prior to Visit  Medication Sig Dispense Refill  . amoxicillin (AMOXIL) 500 MG tablet Take 2 grams (4 tablets) one hour prior to procedure. 12 tablet 0  . Ascorbic Acid (VITAMIN C) 1000 MG tablet Take 1,000 mg by mouth daily.     Marland Kitchen aspirin 81 MG EC tablet Take 81 mg by mouth 2 (two) times daily.     Marland Kitchen atenolol-chlorthalidone (TENORETIC) 50-25 MG tablet TAKE 1 TABLET DAILY. 90 tablet 0  . b complex vitamins tablet Take 1 tablet by mouth daily.     . Calcium Carbonate-Vitamin D (CALCIUM + D PO) Take 1 tablet by mouth daily.     . Cholecalciferol (D 5000) 5000 units capsule Take 5,000 Units by mouth daily.     . Coenzyme Q10 (COQ10) 100 MG CAPS Take 100 mg by mouth every evening.     . diphenoxylate-atropine (LOMOTIL) 2.5-0.025 MG tablet Take 1  tablet by mouth 4 (four) times daily as needed for diarrhea or loose stools. TAKE 1 TABLET BY MOUTH 4 TIMES DAILY AS NEEDED FOR DIARRHEA OR LOOSE STOOLS 60 tablet 3  . folic acid (FOLVITE) 283 MCG tablet Take 800 mcg by mouth every evening.     . hyoscyamine (LEVSIN SL) 0.125 MG SL tablet Take one tablet by mouth before meals three times a day (Patient taking differently: Take 0.125 mg by mouth 3 (three) times daily as needed for cramping. Take one tablet by mouth before meals three times a day as needed) 90 tablet 11  . ketotifen (ZADITOR) 0.025 % ophthalmic solution Place 2 drops into both eyes as needed (allergies).    Marland Kitchen KLOR-CON M20 20 MEQ tablet TAKE 1 TABLET TWICE A DAY 180 tablet 3  . lisinopril (ZESTRIL) 2.5 MG tablet TAKE 1 TABLET DAILY 90 tablet 0  . Magnesium 250 MG TABS Take 250 mg by mouth 2 (two) times daily.     . Melatonin 3 MG TABS Take 3 mg by mouth at bedtime.     . methocarbamol (ROBAXIN) 500 MG tablet TAKE 1 TABLET BY MOUTH EVERY 8 HOURS AS NEEDED FOR MUSCLE SPASMS 30 tablet 0  . Multiple Vitamin (MULTIVITAMIN) tablet Take 1 tablet by mouth daily.      Marland Kitchen OVER  THE COUNTER MEDICATION Tumeric cucuremin 481m daily    . OVER THE COUNTER MEDICATION Lemon Balm 5 drops daily    . OVER THE COUNTER MEDICATION Cats Claw liquid daily    . oxyCODONE (OXY IR/ROXICODONE) 5 MG immediate release tablet Take 1 tablet (5 mg total) by mouth every 6 (six) hours as needed for severe pain. 20 tablet 0  . oxyCODONE-acetaminophen (PERCOCET/ROXICET) 5-325 MG tablet Take 1 tablet by mouth every 6 (six) hours as needed for severe pain. 20 tablet 0  . Probiotic Product (PROBIOTIC DAILY PO) Take 1 capsule by mouth daily.     . simvastatin (ZOCOR) 20 MG tablet TAKE 1 TABLET DAILY 90 tablet 1  . Zinc Sulfate (ZINC 15 PO) Take 1 tablet by mouth daily. Liquid Blend 10 drops equal 15 mg    . Chromium 1 MG CAPS Take 1 mg by mouth daily.  (Patient not taking: Reported on 12/02/2019)    . vitamin E 400 UNIT  capsule Take 400 Units by mouth daily. (Patient not taking: Reported on 12/02/2019)     No facility-administered medications prior to visit.   Past Medical History:  Diagnosis Date  . Arthritis    back, fingers with joint pain and swelling.  chronic back pain  . Chronic back pain    arthritis   . Diverticulosis   . Elevated cholesterol    takes Niacin daily and Simvastatin  . GERD (gastroesophageal reflux disease) 06/2004   non-specific gastritis on EGD 06/2004  . Headache(784.0)    occasionally  . History of colon polyps 2004, 2009   2004:adenomatous. 2009 hyperplastic.   .Marland KitchenHistory of hiatal hernia   . Hypertension    takes Tenoretic and Lisinopril daily  . Mucoid cyst of joint 08/2013   right index finger  . Osteopenia 01/2014   T score -1.1 FRAX 14%/0.5%. Stable from prior DEXA  . PONV (postoperative nausea and vomiting)   . Rotator cuff arthropathy    Left  . Seasonal allergies   . [place]   x 2 - mild left-sided weakness  . Urge incontinence   . Uterine prolapse    Past Surgical History:  Procedure Laterality Date  . BACK SURGERY     x 2  . BELPHAROPTOSIS REPAIR Bilateral 12/14/2017  . CATARACT EXTRACTION    . CATARACT EXTRACTION Bilateral 10/2017  . COLONOSCOPY  2004, 2009, 2014  . FINGER SURGERY    . GUM SURGERY    . GYNECOLOGIC CRYOSURGERY    . HYSTEROSCOPY WITH D & C  12/07/2010   with resection of endometrial polyp  . JOINT REPLACEMENT    . KNEE ARTHROSCOPY Left 2008  . lip biopsy     done at MD office Fri 11/22/13  . MASS EXCISION Right 08/15/2013   Procedure: RIGHT INDEX EXCISION MASS ;  Surgeon: KTennis Must MD;  [facility]  Service: Orthopedics;  Laterality: Right;  . NASAL SEPTUM SURGERY    . OOPHORECTOMY Right 2000  . REVERSE SHOULDER ARTHROPLASTY Left 12/11/2018  . REVERSE SHOULDER ARTHROPLASTY Left 12/11/2018   Procedure: LEFT REVERSE SHOULDER ARTHROPLASTY;  Surgeon: DMeredith Pel MD;  [facility]   Service: Orthopedics;  Laterality: Left;  . SHOULDER ARTHROSCOPY WITH OPEN ROTATOR CUFF REPAIR AND DISTAL CLAVICLE ACROMINECTOMY Right 11/26/2013   Procedure: RIGHT SHOULDER ARTHROSCOPY WITH MINI OPEN ROTATOR CUFF REPAIR AND DISTAL CLAVICLE RESECTION, SUBACROMIAL DECOMPRESSION, POSSIBLE [facility]  Surgeon: PGarald Balding MD;  [facility]  Service: Orthopedics;  Laterality: Right;  . TOTAL KNEE ARTHROPLASTY Right 03/21/2017  . TOTAL KNEE ARTHROPLASTY Right 03/21/2017   Procedure: RIGHT TOTAL KNEE ARTHROPLASTY;  Surgeon: [name], MD;  Location: [facility];  Service: Orthopedics;  Laterality: Right;  . TUBAL LIGATION     Social History   Socioeconomic History  . Marital status: Married    Spouse name: Not on file  . Number of children: 2  . Years of education: 38  . Highest education level: Not on file  Occupational History  . Occupation: Animal nutritionist    Comment: Disability/Retired  Tobacco Use  . Smoking status: Passive Smoke Exposure - Never Smoker  . Smokeless tobacco: Never Used  Vaping Use  . Vaping Use: Never used  Substance and Sexual Activity  . Alcohol use: Not Currently    Alcohol/week: 0.0 standard drinks    Comment: about 2-3 times a year  . Drug use: No  . Sexual activity: Yes    Partners: Male    Birth control/protection: Surgical, Post-menopausal    Comment: BTL-1st intercourse 69 yo-More than 5 partners  Other Topics Concern  . Not on file  Social History Narrative   [name]. She lives with her husband [name]). She has 1 daughter [name]), 1 son [name]) and 2 stepchildren ([place]). They have 3 dogs ([name], Sibley, [name]) and 3 cats ([place], [place], [place]). She enjoys watching movies and outdoors activities.      Exercise - none   Caffeine - varies depending on the day. May have between 1-4 cups daily         Social Determinants of Health   Financial Resource Strain:   . Difficulty of  Paying Living Expenses: Not on file  Food Insecurity:   . Worried About Charity fundraiser in the Last Year: Not on file  . Ran Out of Food in the Last Year: Not on file  Transportation Needs:   . Lack of Transportation (Medical): Not on file  . Lack of Transportation (Non-Medical): Not on file  Physical Activity:   . Days of Exercise per Week: Not on file  . Minutes of Exercise per Session: Not on file  Stress:   . Feeling of Stress : Not on file  Social Connections:   . Frequency of Communication with Friends and Family: Not on file  . Frequency of Social Gatherings with Friends and Family: Not on file  . Attends Religious Services: Not on file  . Active Member of Clubs or Organizations: Not on file  . Attends Archivist Meetings: Not on file  . Marital Status: Not on file   Family History  Problem Relation Age of Onset  . Hypertension Mother   . Heart disease Mother   . Diabetes Father   . Hypertension Father   . Heart disease Father   . Stroke Father   . Diabetes Maternal Aunt   . Diabetes Paternal Grandmother   . Hypertension Paternal Grandfather   . Stroke Paternal Grandfather       Review of Systems: Pertinent positive and negative review of systems were noted in the above HPI section. All other review of systems were otherwise negative.   Physical Exam: General: Well developed, well nourished, no acute distress Head: Normocephalic and atraumatic Eyes:  sclerae anicteric, EOMI Ears: Normal auditory acuity Mouth: Not examined, mask on during Covid-19 pandemic Neck: Supple, no masses or thyromegaly Lungs: Clear throughout  to auscultation Heart: Regular rate and rhythm; no murmurs, rubs or bruits Abdomen: Soft, non tender and non distended. No masses, hepatosplenomegaly or hernias noted. Normal Bowel sounds Rectal: Deferred to colonoscopoy  Musculoskeletal: Symmetrical with no gross deformities  Skin: No lesions on visible extremities Pulses:  Normal  pulses noted Extremities: No clubbing, cyanosis, edema or deformities noted Neurological: Alert oriented x 4, grossly nonfocal Cervical Nodes:  No significant cervical adenopathy Inguinal Nodes: No significant inguinal adenopathy Psychological:  Alert and cooperative. Normal mood and affect   Assessment and Recommendations:  1. Chronic diarrhea with recent exacerbation. R/O microscopic colitis, celiac disease, pancreatic insufficiency, infection, functional. Check tTG, IgA, TSH, fecal elastase, GI stool profile and schedule colonoscopy. The risks (including bleeding, perforation, infection, missed lesions, medication reactions and possible hospitalization or surgery if complications occur), benefits, and alternatives to colonoscopy with possible biopsy and possible polypectomy were discussed with the patient and they consent to proceed.   2. IDA. R/O gastrointestinal lesions. Schedule colonoscopy and EGD.  The risks (including bleeding, perforation, infection, missed lesions, medication reactions and possible hospitalization or surgery if complications occur), benefits, and alternatives to endoscopy with possible biopsy and possible dilation were discussed with the patient and they consent to proceed.   3. Personal history of adenomatous colon polyps. She is overdue for surveillance colonoscopy. Schedule colonoscopy as above.    cc: Hoyt Koch, MD [address] [place],  [place] [id number]

## 2019-12-26 ENCOUNTER — Other Ambulatory Visit: Payer: Medicare Other

## 2019-12-26 ENCOUNTER — Other Ambulatory Visit: Payer: Self-pay

## 2019-12-26 DIAGNOSIS — R197 Diarrhea, unspecified: Secondary | ICD-10-CM | POA: Diagnosis not present

## 2019-12-26 DIAGNOSIS — D509 Iron deficiency anemia, unspecified: Secondary | ICD-10-CM | POA: Diagnosis not present

## 2019-12-26 DIAGNOSIS — A09 Infectious gastroenteritis and colitis, unspecified: Secondary | ICD-10-CM | POA: Diagnosis not present

## 2019-12-26 LAB — TISSUE TRANSGLUTAMINASE, IGA: (tTG) Ab, IgA: 1 U/mL

## 2019-12-26 MED ORDER — DIPHENOXYLATE-ATROPINE 2.5-0.025 MG PO TABS: 1.0000 | ORAL_TABLET | Freq: Four times a day (QID) | ORAL | 2 refills | Status: DC | PRN

## 2019-12-26 MED ORDER — HYOSCYAMINE SULFATE 0.125 MG SL SUBL: SUBLINGUAL_TABLET | SUBLINGUAL | 11 refills | Status: DC

## 2019-12-29 LAB — GI PROFILE, STOOL, PCR

## 2019-12-31 LAB — PANCREATIC ELASTASE, FECAL: Pancreatic Elastase-1, Stool: >500 mcg/g

## 2020-01-13 ENCOUNTER — Ambulatory Visit: Payer: Medicare Other | Admitting: Internal Medicine

## 2020-01-18 DIAGNOSIS — H43813 Vitreous degeneration, bilateral: Secondary | ICD-10-CM | POA: Diagnosis not present

## 2020-01-18 DIAGNOSIS — Z961 Presence of intraocular lens: Secondary | ICD-10-CM | POA: Diagnosis not present

## 2020-01-18 DIAGNOSIS — H524 Presbyopia: Secondary | ICD-10-CM | POA: Diagnosis not present

## 2020-02-09 ENCOUNTER — Encounter: Payer: Self-pay | Admitting: Certified Registered Nurse Anesthetist

## 2020-02-10 ENCOUNTER — Encounter: Payer: Self-pay | Admitting: Gastroenterology

## 2020-02-10 ENCOUNTER — Ambulatory Visit (AMBULATORY_SURGERY_CENTER): Payer: Medicare Other | Admitting: Gastroenterology

## 2020-02-10 ENCOUNTER — Other Ambulatory Visit: Payer: Self-pay

## 2020-02-10 VITALS — BP 123/59 | HR 94 | Temp 97.3°F | Resp 20 | Ht 62.0 in | Wt 175.0 lb

## 2020-02-10 DIAGNOSIS — K640 First degree hemorrhoids: Secondary | ICD-10-CM | POA: Diagnosis not present

## 2020-02-10 DIAGNOSIS — K5730 Diverticulosis of large intestine without perforation or abscess without bleeding: Secondary | ICD-10-CM | POA: Diagnosis not present

## 2020-02-10 DIAGNOSIS — Z86010 Personal history of colonic polyps: Secondary | ICD-10-CM | POA: Diagnosis not present

## 2020-02-10 DIAGNOSIS — K449 Diaphragmatic hernia without obstruction or gangrene: Secondary | ICD-10-CM

## 2020-02-10 DIAGNOSIS — D509 Iron deficiency anemia, unspecified: Secondary | ICD-10-CM | POA: Diagnosis not present

## 2020-02-10 DIAGNOSIS — R197 Diarrhea, unspecified: Secondary | ICD-10-CM

## 2020-02-10 DIAGNOSIS — K52832 Lymphocytic colitis: Secondary | ICD-10-CM

## 2020-02-10 DIAGNOSIS — K222 Esophageal obstruction: Secondary | ICD-10-CM

## 2020-02-10 DIAGNOSIS — Z8719 Personal history of other diseases of the digestive system: Secondary | ICD-10-CM

## 2020-02-10 DIAGNOSIS — I10 Essential (primary) hypertension: Secondary | ICD-10-CM | POA: Diagnosis not present

## 2020-02-10 HISTORY — PX: COLONOSCOPY WITH ESOPHAGOGASTRODUODENOSCOPY (EGD): SHX5779

## 2020-02-10 HISTORY — DX: Personal history of other diseases of the digestive system: Z87.19

## 2020-02-10 HISTORY — DX: Esophageal obstruction: K22.2

## 2020-02-10 MED ORDER — SODIUM CHLORIDE 0.9 % IV SOLN: 500.0000 mL | Freq: Once | INTRAVENOUS | Status: DC

## 2020-02-10 NOTE — Patient Instructions (Signed)
Impression/Recommendations:  Hiatal hernia, diverticulosis, and hemorrhoid handouts given to patient.  Resume previous diet. Continue present medications. Await pathology results.  Repeat colonoscopy in 10 years for screening purposes.  YOU HAD AN ENDOSCOPIC PROCEDURE TODAY AT [facility]:   Refer to the procedure report that was given to you for any specific questions about what was found during the examination.  If the procedure report does not answer your questions, please call your gastroenterologist to clarify.  If you requested that your care partner not be given the details of your procedure findings, then the procedure report has been included in a sealed envelope for you to review at your convenience later.  YOU SHOULD EXPECT: Some feelings of bloating in the abdomen. Passage of more gas than usual.  Walking can help get rid of the air that was put into your GI tract during the procedure and reduce the bloating. If you had a lower endoscopy (such as a colonoscopy or flexible sigmoidoscopy) you may notice spotting of blood in your stool or on the toilet paper. If you underwent a bowel prep for your procedure, you may not have a normal bowel movement for a few days.  Please Note:  You might notice some irritation and congestion in your nose or some drainage.  This is from the oxygen used during your procedure.  There is no need for concern and it should clear up in a day or so.  SYMPTOMS TO REPORT IMMEDIATELY:   Following lower endoscopy (colonoscopy or flexible sigmoidoscopy):  Excessive amounts of blood in the stool  Significant tenderness or worsening of abdominal pains  Swelling of the abdomen that is new, acute  Fever of 100F or higher   Following upper endoscopy (EGD)  Vomiting of blood or coffee ground material  New chest pain or pain under the shoulder blades  Painful or persistently difficult swallowing  New shortness of breath  Fever of 100F or  higher  Black, tarry-looking stools  For urgent or emergent issues, a gastroenterologist can be reached at any hour by calling [phone]. Do not use MyChart messaging for urgent concerns.    DIET:  We do recommend a small meal at first, but then you may proceed to your regular diet.  Drink plenty of fluids but you should avoid alcoholic beverages for 24 hours.  ACTIVITY:  You should plan to take it easy for the rest of today and you should NOT DRIVE or use heavy machinery until tomorrow (because of the sedation medicines used during the test).    FOLLOW UP: Our staff will call the number listed on your records 48-72 hours following your procedure to check on you and address any questions or concerns that you may have regarding the information given to you following your procedure. If we do not reach you, we will leave a message.  We will attempt to reach you two times.  During this call, we will ask if you have developed any symptoms of COVID 19. If you develop any symptoms (ie: fever, flu-like symptoms, shortness of breath, cough etc.) before then, please call [phone].  If you test positive for Covid 19 in the 2 weeks post procedure, please call and report this information to [place].    If any biopsies were taken you will be contacted by phone or by letter within the next 1-3 weeks.  Please call us at [phone] if you have not heard about the biopsies in 3 weeks.  SIGNATURES/CONFIDENTIALITY: You and/or your care partner have signed paperwork which will be entered into your electronic medical record.  These signatures attest to the fact that that the information above on your After Visit Summary has been reviewed and is understood.  Full responsibility of the confidentiality of this discharge information lies with you and/or your care-partner.

## 2020-02-10 NOTE — Op Note (Signed)
[facility] Patient Name: [name] Procedure Date: 02/10/2020 2:01 PM MRN: [id number] Endoscopist: Ladene Artist , MD Age: 69 Referring MD:  Date of Birth: 1951/01/17 Gender: Female Account #: [id number] Procedure:                Upper GI endoscopy Indications:              Unexplained iron deficiency anemia, Diarrhea Medicines:                Monitored Anesthesia Care Procedure:                Pre-Anesthesia Assessment:                           - Prior to the procedure, a History and Physical                            was performed, and patient medications and                            allergies were reviewed. The patient's tolerance of                            previous anesthesia was also reviewed. The risks                            and benefits of the procedure and the sedation                            options and risks were discussed with the patient.                            All questions were answered, and informed consent                            was obtained. Prior Anticoagulants: The patient has                            taken no previous anticoagulant or antiplatelet                            agents. ASA Grade Assessment: III - A patient with                            severe systemic disease. After reviewing the risks                            and benefits, the patient was deemed in                            satisfactory condition to undergo the procedure.                           After obtaining informed consent, the endoscope was  passed under direct vision. Throughout the                            procedure, the patient's blood pressure, pulse, and                            oxygen saturations were monitored continuously. The                            Endoscope was introduced through the mouth, and                            advanced to the second part of duodenum. The upper                            GI  endoscopy was accomplished without difficulty.                            The patient tolerated the procedure well. Scope In: Scope Out: Findings:                 One benign-appearing, intrinsic, mild, non                            obstructing stenosis was found at the                            gastroesophageal junction. This stenosis measured                            1.5 cm (inner diameter) x less than one cm (in                            length). The stenosis was traversed.                           The exam of the esophagus was otherwise normal.                           A small hiatal hernia was present.                           The exam of the stomach was otherwise normal.                           The duodenal bulb and second portion of the                            duodenum were normal. Biopsies for histology were                            taken with a cold forceps for evaluation of celiac  disease. Complications:            No immediate complications. Estimated Blood Loss:     Estimated blood loss was minimal. Impression:               - Benign-appearing non obstructing esophageal                            stenosis.                           - Small hiatal hernia.                           - Normal duodenal bulb and second portion of the                            duodenum. Biopsied. Recommendation:           - Patient has a contact number available for                            emergencies. The signs and symptoms of potential                            delayed complications were discussed with the                            patient. Return to normal activities tomorrow.                            Written discharge instructions were provided to the                            patient.                           - Resume previous diet.                           - Continue present medications.                           - Await pathology  results. Ladene Artist, MD 02/10/2020 2:38:04 PM This report has been signed electronically.

## 2020-02-10 NOTE — Progress Notes (Signed)
1430 Ephedrine 10 mg given IV due to low BP, MD updated.

## 2020-02-10 NOTE — Progress Notes (Signed)
Report given to PACU, vss 

## 2020-02-10 NOTE — Op Note (Signed)
[facility] Patient Name: [name] Procedure Date: 02/10/2020 2:02 PM MRN: [id number] Endoscopist: Ladene Artist , MD Age: 69 Referring MD:  Date of Birth: 01-25-1951 Gender: Female Account #: [id number] Procedure:                Colonoscopy Indications:              Unexplained iron deficiency anemia, Chronic diarrhea Medicines:                Monitored Anesthesia Care Procedure:                Pre-Anesthesia Assessment:                           - Prior to the procedure, a History and Physical                            was performed, and patient medications and                            allergies were reviewed. The patient's tolerance of                            previous anesthesia was also reviewed. The risks                            and benefits of the procedure and the sedation                            options and risks were discussed with the patient.                            All questions were answered, and informed consent                            was obtained. Prior Anticoagulants: The patient has                            taken no previous anticoagulant or antiplatelet                            agents. ASA Grade Assessment: III - A patient with                            severe systemic disease. After reviewing the risks                            and benefits, the patient was deemed in                            satisfactory condition to undergo the procedure.                           After obtaining informed consent, the colonoscope  was passed under direct vision. Throughout the                            procedure, the patient's blood pressure, pulse, and                            oxygen saturations were monitored continuously. The                            Colonoscope was introduced through the anus and                            advanced to the the cecum, identified by                            appendiceal  orifice and ileocecal valve. The                            ileocecal valve, appendiceal orifice, and rectum                            were photographed. The quality of the bowel                            preparation was good. The colonoscopy was somewhat                            difficult due to significant looping and a tortuous                            colon. The patient tolerated the procedure well. Scope In: 2:05:11 PM Scope Out: 2:22:09 PM Scope Withdrawal Time: 0 hours 11 minutes 53 seconds  Total Procedure Duration: 0 hours 16 minutes 58 seconds  Findings:                 The perianal and digital rectal examinations were                            normal.                           A few medium-mouthed diverticula were found in the                            sigmoid colon and transverse colon. There was no                            evidence of diverticular bleeding.                           Internal hemorrhoids were found during                            retroflexion. The hemorrhoids were small and Grade  I (internal hemorrhoids that do not prolapse).                           The exam was otherwise without abnormality on                            direct and retroflexion views. Random biospies                            obtained. Complications:            No immediate complications. Estimated blood loss:                            None. Estimated Blood Loss:     Estimated blood loss: none. Impression:               - Mild diverticulosis.                           - Internal hemorrhoids.                           - The examination was otherwise normal on direct                            and retroflexion views. Biopsies obtained. Recommendation:           - Repeat colonoscopy in 10 years for screening                            purposes.                           - Patient has a contact number available for                             emergencies. The signs and symptoms of potential                            delayed complications were discussed with the                            patient. Return to normal activities tomorrow.                            Written discharge instructions were provided to the                            patient.                           - Resume previous diet.                           - Continue present medications.                           -  Await pathology results. Ladene Artist, MD 02/10/2020 2:35:08 PM This report has been signed electronically.

## 2020-02-10 NOTE — Progress Notes (Signed)
1400 Robinul 0.1 mg IV given due large amount of secretions upon assessment.  MD made aware, vss

## 2020-02-10 NOTE — Progress Notes (Signed)
Called to room to assist during endoscopic procedure.  Patient ID and intended procedure confirmed with present staff. Received instructions for my participation in the procedure from the performing physician.  

## 2020-02-12 ENCOUNTER — Telehealth: Payer: Self-pay

## 2020-02-12 NOTE — Telephone Encounter (Signed)
  Follow up Call-  Call back number 02/10/2020  Post procedure Call Back phone  # [phone]  Permission to leave phone message Yes  Some recent data might be hidden     Patient questions:  Do you have a fever, pain , or abdominal swelling? No. Pain Score  0 *  Have you tolerated food without any problems? Yes.    Have you been able to return to your normal activities? Yes.    Do you have any questions about your discharge instructions: Diet   No. Medications  No. Follow up visit  Yes.    Do you have questions or concerns about your Care? Yes.   Patient is still having heartburn post procedure.  She would like to have a follow up appointment if this does not resolve.  This RN told her to await pathology results and if Dr. Fuller Plan advises any further treatments or medications.  Patient also advised to call any time to follow up if needed.  Patient verbalizes understanding. Actions: * If pain score is 4 or above: No action needed, pain <4. 1. Have you developed a fever since your procedure? no  2.   Have you had an respiratory symptoms (SOB or cough) since your procedure? no  3.   Have you tested positive for COVID 19 since your procedure no  4.   Have you had any family members/close contacts diagnosed with the COVID 19 since your procedure?  no   If yes to any of these questions please route to Joylene John, RN and Joella Prince, RN

## 2020-02-19 ENCOUNTER — Other Ambulatory Visit: Payer: Self-pay | Admitting: Internal Medicine

## 2020-02-19 ENCOUNTER — Other Ambulatory Visit: Payer: Self-pay

## 2020-02-19 MED ORDER — BUDESONIDE 3 MG PO CPEP: 9.0000 mg | ORAL_CAPSULE | Freq: Every day | ORAL | 2 refills | Status: DC

## 2020-02-24 ENCOUNTER — Other Ambulatory Visit: Payer: Self-pay

## 2020-02-24 ENCOUNTER — Ambulatory Visit (INDEPENDENT_AMBULATORY_CARE_PROVIDER_SITE_OTHER): Payer: Medicare Other | Admitting: Internal Medicine

## 2020-02-24 ENCOUNTER — Encounter: Payer: Self-pay | Admitting: Internal Medicine

## 2020-02-24 VITALS — BP 126/76 | HR 76 | Temp 97.5°F | Ht 62.0 in | Wt 173.0 lb

## 2020-02-24 DIAGNOSIS — E782 Mixed hyperlipidemia: Secondary | ICD-10-CM | POA: Diagnosis not present

## 2020-02-24 DIAGNOSIS — K50111 Crohn's disease of large intestine with rectal bleeding: Secondary | ICD-10-CM

## 2020-02-24 DIAGNOSIS — I10 Essential (primary) hypertension: Secondary | ICD-10-CM | POA: Diagnosis not present

## 2020-02-24 DIAGNOSIS — Z0000 Encounter for general adult medical examination without abnormal findings: Secondary | ICD-10-CM

## 2020-02-24 DIAGNOSIS — R911 Solitary pulmonary nodule: Secondary | ICD-10-CM | POA: Diagnosis not present

## 2020-02-24 LAB — LIPID PANEL
Cholesterol: 121 mg/dL (ref 0–200)
HDL: 43.5 mg/dL (ref >39.00)
LDL Cholesterol: 62 mg/dL (ref 0–99)
NonHDL: 77.33
Total CHOL/HDL Ratio: 3
Triglycerides: 76 mg/dL (ref 0.0–149.0)
VLDL: 15.2 mg/dL (ref 0.0–40.0)

## 2020-02-24 LAB — COMPREHENSIVE METABOLIC PANEL
ALT: 14 U/L (ref 0–35)
AST: 22 U/L (ref 0–37)
Albumin: 4.2 g/dL (ref 3.5–5.2)
Alkaline Phosphatase: 59 U/L (ref 39–117)
BUN: 19 mg/dL (ref 6–23)
CO2: 31 mEq/L (ref 19–32)
Calcium: 9.7 mg/dL (ref 8.4–10.5)
Chloride: 105 mEq/L (ref 96–112)
Creatinine, Ser: 0.96 mg/dL (ref 0.40–1.20)
GFR: 60.31 mL/min (ref >60.00)
Glucose, Bld: 88 mg/dL (ref 70–99)
Potassium: 4.2 mEq/L (ref 3.5–5.1)
Sodium: 143 mEq/L (ref 135–145)
Total Bilirubin: 0.6 mg/dL (ref 0.2–1.2)
Total Protein: 7.3 g/dL (ref 6.0–8.3)

## 2020-02-24 LAB — CBC
HCT: 36.7 % (ref 36.0–46.0)
Hemoglobin: 11.8 g/dL — ABNORMAL LOW (ref 12.0–15.0)
MCHC: 32.2 g/dL (ref 30.0–36.0)
MCV: 78.4 fl (ref 78.0–100.0)
Platelets: 258 10*3/uL (ref 150.0–400.0)
RBC: 4.69 Mil/uL (ref 3.87–5.11)
RDW: 16.7 % — ABNORMAL HIGH (ref 11.5–15.5)
WBC: 11.4 10*3/uL — ABNORMAL HIGH (ref 4.0–10.5)

## 2020-02-24 MED ORDER — ATENOLOL-CHLORTHALIDONE 50-25 MG PO TABS
1.0000 | ORAL_TABLET | Freq: Every day | ORAL | 3 refills | Status: DC
Start: 2020-02-24 — End: 2021-02-23

## 2020-02-24 MED ORDER — SIMVASTATIN 20 MG PO TABS
20.0000 mg | ORAL_TABLET | Freq: Every day | ORAL | 3 refills | Status: DC
Start: 2020-02-24 — End: 2021-02-23

## 2020-02-24 MED ORDER — POTASSIUM CHLORIDE CRYS ER 20 MEQ PO TBCR: 20.0000 meq | EXTENDED_RELEASE_TABLET | Freq: Two times a day (BID) | ORAL | 3 refills | Status: DC

## 2020-02-24 MED ORDER — METHOCARBAMOL 500 MG PO TABS: ORAL_TABLET | ORAL | 3 refills | Status: AC

## 2020-02-24 MED ORDER — LISINOPRIL 2.5 MG PO TABS
2.5000 mg | ORAL_TABLET | Freq: Every day | ORAL | 3 refills | Status: DC
Start: 2020-02-24 — End: 2020-03-17

## 2020-02-24 NOTE — Assessment & Plan Note (Signed)
Ordered ct non contrast to re-evaluate nodule seen ct shoulder 09/2018.

## 2020-02-24 NOTE — Assessment & Plan Note (Addendum)
BP at goal on atenolol/chlorthalidone and lisinopril. Checking CMP and adjust as needed.

## 2020-02-24 NOTE — Progress Notes (Signed)
Subjective:   Patient ID: [name], female    DOB: [birth year], 69 y.o.   MRN: [id number]  HPI Here for medicare wellness, no new complaints. Please see A/P for status and treatment of chronic medical problems.   HPI #2: Here for follow up diarrhea (segmental colitis and seeing GI recently diagnosed in last month, overall worsening and just started 1 week ago the budesonide, uses levsin and lomotil and budesonide) and blood pressure (taking atenolol, chlorthalidone and lisinopril, denies side effects or missed doses, denies headaches or chest pains) and cholesterol (taking simvastatin daily, denies missing doses, denies chest pains or stroke symptoms).   Diet: heart healthy Physical activity: sedentary Depression/mood screen: negative Hearing: intact to whispered voice Visual acuity: grossly normal with lens, performs annual eye exam  ADLs: capable Fall risk: none Home safety: good Cognitive evaluation: intact to orientation, naming, recall and repetition EOL planning: adv directives discussed    Office Visit from 02/24/2020 in [place] at [facility] Total Score 0      I have personally reviewed and have noted 1. The patient's medical and social history - reviewed today no changes 2. Their use of alcohol, tobacco or illicit drugs 3. Their current medications and supplements 4. The patient's functional ability including ADL's, fall risks, home safety risks and hearing or visual impairment. 5. Diet and physical activities 6. Evidence for depression or mood disorders 7. Care team reviewed and updated  Patient Care Team: Hoyt Koch, MD as PCP - General (Internal Medicine) Past Medical History:  Diagnosis Date  . Arthritis    back, fingers with joint pain and swelling.  chronic back pain  . Cataract   . Chronic back pain    arthritis   . Diverticulosis   . Elevated cholesterol    takes Niacin daily and Simvastatin  . GERD  (gastroesophageal reflux disease) 06/2004   non-specific gastritis on EGD 06/2004  . Headache(784.0)    occasionally  . History of colon polyps 2004, 2009   2004:adenomatous. 2009 hyperplastic.   Marland Kitchen History of hiatal hernia   . Hypertension    takes Tenoretic and Lisinopril daily  . Mucoid cyst of joint 08/2013   right index finger  . Osteopenia 01/2014   T score -1.1 FRAX 14%/0.5%. Stable from prior DEXA  . PONV (postoperative nausea and vomiting)   . Rotator cuff arthropathy    Left  . Seasonal allergies   . Stroke ([place]) 1998   x 2 - mild left-sided weakness  . Urge incontinence   . Uterine prolapse    Past Surgical History:  Procedure Laterality Date  . BACK SURGERY     x 2  . BELPHAROPTOSIS REPAIR Bilateral 12/14/2017  . CATARACT EXTRACTION    . CATARACT EXTRACTION Bilateral 10/2017  . COLONOSCOPY  2004, 2009, 2014  . FINGER SURGERY    . GUM SURGERY    . GYNECOLOGIC CRYOSURGERY    . HYSTEROSCOPY WITH D & C  12/07/2010   with resection of endometrial polyp  . JOINT REPLACEMENT    . KNEE ARTHROSCOPY Left 2008  . lip biopsy     done at MD office Fri 11/22/13  . MASS EXCISION Right 08/15/2013   Procedure: RIGHT INDEX EXCISION MASS ;  Surgeon: Tennis Must, MD;  Location: [place];  Service: Orthopedics;  Laterality: Right;  . NASAL SEPTUM SURGERY    . OOPHORECTOMY Right 2000  . REVERSE SHOULDER ARTHROPLASTY Left 12/11/2018  .  REVERSE SHOULDER ARTHROPLASTY Left 12/11/2018   Procedure: LEFT REVERSE SHOULDER ARTHROPLASTY;  Surgeon: Meredith Pel, MD;  Location: [place];  Service: Orthopedics;  Laterality: Left;  . SHOULDER ARTHROSCOPY WITH OPEN ROTATOR CUFF REPAIR AND DISTAL CLAVICLE ACROMINECTOMY Right 11/26/2013   Procedure: RIGHT SHOULDER ARTHROSCOPY WITH MINI OPEN ROTATOR CUFF REPAIR AND DISTAL CLAVICLE RESECTION, SUBACROMIAL DECOMPRESSION, POSSIBLE [facility] PATCH.;  Surgeon: [name], MD;  Location: [place];  Service: Orthopedics;  Laterality:  Right;  . TOTAL KNEE ARTHROPLASTY Right 03/21/2017  . TOTAL KNEE ARTHROPLASTY Right 03/21/2017   Procedure: RIGHT TOTAL KNEE ARTHROPLASTY;  Surgeon: [name], MD;  Location: [place];  Service: Orthopedics;  Laterality: Right;  . TUBAL LIGATION     Family History  Problem Relation Age of Onset  . Hypertension Mother   . Heart disease Mother   . Diabetes Father   . Hypertension Father   . Heart disease Father   . Stroke Father   . Diabetes Maternal Aunt   . Diabetes Paternal Grandmother   . Hypertension Paternal Grandfather   . Stroke Paternal Grandfather   . Colon cancer Neg Hx   . Esophageal cancer Neg Hx   . Rectal cancer Neg Hx   . Stomach cancer Neg Hx    Review of Systems  Constitutional: Negative.   HENT: Negative.   Eyes: Negative.   Respiratory: Negative for cough, chest tightness and shortness of breath.   Cardiovascular: Negative for chest pain, palpitations and leg swelling.  Gastrointestinal: Positive for abdominal pain and diarrhea. Negative for abdominal distention, constipation, nausea and vomiting.  Musculoskeletal: Negative.   Skin: Negative.   Neurological: Negative.   Psychiatric/Behavioral: Negative.     Objective:  Physical Exam Constitutional:      Appearance: She is well-developed. She is obese.  HENT:     Head: Normocephalic and atraumatic.  Cardiovascular:     Rate and Rhythm: Normal rate and regular rhythm.  Pulmonary:     Effort: Pulmonary effort is normal. No respiratory distress.     Breath sounds: Normal breath sounds. No wheezing or rales.  Abdominal:     General: Bowel sounds are normal. There is no distension.     Palpations: Abdomen is soft.     Tenderness: There is no abdominal tenderness. There is no rebound.  Musculoskeletal:     Cervical back: Normal range of motion.  Skin:    General: Skin is warm and dry.  Neurological:     Mental Status: She is alert and oriented to person, place, and time.     Coordination:  Coordination normal.     Vitals:   02/24/20 1059  BP: 126/76  Pulse: 76  Temp: (!) 97.5 F (36.4 C)  TempSrc: Oral  SpO2: 97%  Weight: 173 lb (78.5 kg)  Height: 5' 2"  (1.575 m)   This visit occurred during the SARS-CoV-2 public health emergency.  Safety protocols were in place, including screening questions prior to the visit, additional usage of staff PPE, and extensive cleaning of exam room while observing appropriate contact time as indicated for disinfecting solutions.   Assessment & Plan:

## 2020-02-24 NOTE — Assessment & Plan Note (Signed)
Checking lipid panel and adjust simvastatin as needed.

## 2020-02-24 NOTE — Assessment & Plan Note (Signed)
Flu shot declines. Covid-19 2 shots advised booster. Pneumonia complete. Shingrix counseled declines. Tetanus due 2022. Colonoscopy due 2031. Mammogram due 2023, pap smear aged out and dexa due 2022. Counseled about sun safety and mole surveillance. Counseled about the dangers of distracted driving. Given 10 year screening recommendations.

## 2020-02-24 NOTE — Assessment & Plan Note (Signed)
Taking budesonide in the last week or so and hopefully this will help with her worsening symptoms. Advised that if she needs refill of lomotil she can let us know as we have prescribed this for her in the past.

## 2020-02-24 NOTE — Patient Instructions (Signed)
Health Maintenance, Female Adopting a healthy lifestyle and getting preventive care are important in promoting health and wellness. Ask your health care provider about:  The right schedule for you to have regular tests and exams.  Things you can do on your own to prevent diseases and keep yourself healthy. What should I know about diet, weight, and exercise? Eat a healthy diet   Eat a diet that includes plenty of vegetables, fruits, low-fat dairy products, and lean protein.  Do not eat a lot of foods that are high in solid fats, added sugars, or sodium. Maintain a healthy weight Body mass index (BMI) is used to identify weight problems. It estimates body fat based on height and weight. Your health care provider can help determine your BMI and help you achieve or maintain a healthy weight. Get regular exercise Get regular exercise. This is one of the most important things you can do for your health. Most adults should:  Exercise for at least 150 minutes each week. The exercise should increase your heart rate and make you sweat (moderate-intensity exercise).  Do strengthening exercises at least twice a week. This is in addition to the moderate-intensity exercise.  Spend less time sitting. Even light physical activity can be beneficial. Watch cholesterol and blood lipids Have your blood tested for lipids and cholesterol at 69 years of age, then have this test every 5 years. Have your cholesterol levels checked more often if:  Your lipid or cholesterol levels are high.  You are older than 69 years of age.  You are at high risk for heart disease. What should I know about cancer screening? Depending on your health history and family history, you may need to have cancer screening at various ages. This may include screening for:  Breast cancer.  Cervical cancer.  Colorectal cancer.  Skin cancer.  Lung cancer. What should I know about heart disease, diabetes, and high blood  pressure? Blood pressure and heart disease  High blood pressure causes heart disease and increases the risk of stroke. This is more likely to develop in people who have high blood pressure readings, are of African descent, or are overweight.  Have your blood pressure checked: ? Every 3-5 years if you are 18-39 years of age. ? Every year if you are 40 years old or older. Diabetes Have regular diabetes screenings. This checks your fasting blood sugar level. Have the screening done:  Once every three years after age 40 if you are at a normal weight and have a low risk for diabetes.  More often and at a younger age if you are overweight or have a high risk for diabetes. What should I know about preventing infection? Hepatitis B If you have a higher risk for hepatitis B, you should be screened for this virus. Talk with your health care provider to find out if you are at risk for hepatitis B infection. Hepatitis C Testing is recommended for:  Everyone born from 1945 through 1965.  Anyone with known risk factors for hepatitis C. Sexually transmitted infections (STIs)  Get screened for STIs, including gonorrhea and chlamydia, if: ? You are sexually active and are younger than 69 years of age. ? You are older than 69 years of age and your health care provider tells you that you are at risk for this type of infection. ? Your sexual activity has changed since you were last screened, and you are at increased risk for chlamydia or gonorrhea. Ask your health care provider if   you are at risk.  Ask your health care provider about whether you are at high risk for HIV. Your health care provider may recommend a prescription medicine to help prevent HIV infection. If you choose to take medicine to prevent HIV, you should first get tested for HIV. You should then be tested every 3 months for as long as you are taking the medicine. Pregnancy  If you are about to stop having your period (premenopausal) and  you may become pregnant, seek counseling before you get pregnant.  Take 400 to 800 micrograms (mcg) of folic acid every day if you become pregnant.  Ask for birth control (contraception) if you want to prevent pregnancy. Osteoporosis and menopause Osteoporosis is a disease in which the bones lose minerals and strength with aging. This can result in bone fractures. If you are 65 years old or older, or if you are at risk for osteoporosis and fractures, ask your health care provider if you should:  Be screened for bone loss.  Take a calcium or vitamin D supplement to lower your risk of fractures.  Be given hormone replacement therapy (HRT) to treat symptoms of menopause. Follow these instructions at home: Lifestyle  Do not use any products that contain nicotine or tobacco, such as cigarettes, e-cigarettes, and chewing tobacco. If you need help quitting, ask your health care provider.  Do not use street drugs.  Do not share needles.  Ask your health care provider for help if you need support or information about quitting drugs. Alcohol use  Do not drink alcohol if: ? Your health care provider tells you not to drink. ? You are pregnant, may be pregnant, or are planning to become pregnant.  If you drink alcohol: ? Limit how much you use to 0-1 drink a day. ? Limit intake if you are breastfeeding.  Be aware of how much alcohol is in your drink. In the [place] one drink equals one 12 oz bottle of beer (355 mL), one 5 oz glass of wine (148 mL), or one 1 oz glass of hard liquor (44 mL). General instructions  Schedule regular health, dental, and eye exams.  Stay current with your vaccines.  Tell your health care provider if: ? You often feel depressed. ? You have ever been abused or do not feel safe at home. Summary  Adopting a healthy lifestyle and getting preventive care are important in promoting health and wellness.  Follow your health care provider's instructions about healthy  diet, exercising, and getting tested or screened for diseases.  Follow your health care provider's instructions on monitoring your cholesterol and blood pressure. This information is not intended to replace advice given to you by your health care provider. Make sure you discuss any questions you have with your health care provider. Document Revised: 03/21/2018 Document Reviewed: 03/21/2018 Elsevier Patient Education  2020 Elsevier Inc.  

## 2020-02-25 ENCOUNTER — Ambulatory Visit (INDEPENDENT_AMBULATORY_CARE_PROVIDER_SITE_OTHER): Payer: Medicare Other | Admitting: Nurse Practitioner

## 2020-02-25 ENCOUNTER — Encounter: Payer: Self-pay | Admitting: Nurse Practitioner

## 2020-02-25 VITALS — BP 118/70 | HR 65 | Ht 62.0 in | Wt 172.0 lb

## 2020-02-25 DIAGNOSIS — K52832 Lymphocytic colitis: Secondary | ICD-10-CM

## 2020-02-25 DIAGNOSIS — K219 Gastro-esophageal reflux disease without esophagitis: Secondary | ICD-10-CM | POA: Diagnosis not present

## 2020-02-25 NOTE — Patient Instructions (Addendum)
If you are age 69 or older, your body mass index should be between 23-30. Your Body mass index is 31.46 kg/m. If this is out of the aforementioned range listed, please consider follow up with your Primary Care Provider.  If you are age 55 or younger, your body mass index should be between 19-25. Your Body mass index is 31.46 kg/m. If this is out of the aformentioned range listed, please consider follow up with your Primary Care Provider.   Continue Entocort 9 mg daily.  Pepcid 20 mg daily as needed.  Call with an update in 2 months and discuss tapering Entocort, ask for [facility]

## 2020-02-25 NOTE — Progress Notes (Signed)
Reviewed and agree with management plan. Recommend oral iron replacement. If she cannot tolerate or it is not effective change to IV iron replacement.    Pricilla Riffle. Fuller Plan, MD [facility]

## 2020-02-25 NOTE — Progress Notes (Signed)
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  Improving on budesonide 9 mg daily.  We discussed risk factors for lymphocytic colitis, mainly medications. She is on chronic statin therapy but doesn't routinely take NSAIDs. She isn't on a PPI or SSRI.  --Stool frequency and consistency improving, requiring less Lomotil now.  Continue budesonide 9 mg daily for now.  I asked patient to call the office in approximately 8 weeks.  Depending on how she is doing at that time will continue tapering budesonide.   # Iron deficiency anemia. Recent EGD with duodenal biopsies was negative and no polyps / cancers on colonoscopy. She needs iron replacement. Will discuss with her PCP, Dr. Fuller Plan as to whether he wants to refer her for IV iron or just go with oral iron replacement for now.   # GERD, symptomatic a few times a week. Untreated --We discussed anti-reflux measures, including weight loss --Trial of Pepcid 20 mg daily. At this time will try to avoid PPI given recent diagnosis of lymphocytic colitis.     HISTORY OF PRESENT ILLNESS     Primary Gastroenterologist :  Lucio Edward, MD  Chief Complaint : Follow-up after EGD and colonoscopy done for iron deficiency anemia and chronic diarrhea  [name] is a 69 y.o. female with PMH / [place] significant for,  but not necessarily limited to:  Hypertension, hyperlipidemia, arthritis, diverticulosis, lymphocytic colitis  Patient was seen in the clinic mid September for evaluation of worsening chronic diarrhea and iron deficiency anemia.  Recent blood work had shown a decrease in hemoglobin by approximately 1 g.  Her ferritin was low at 12.  Patient scheduled for EGD and colonoscopy. Stool GI profile was negative.  Pancreatic elastase normal.  TTG and IgA were normal. She underwent EGD and colonoscopy earlier this month. Found to have lymphocytic colitis which can  explain her worsening diarrhea. No evidence for celiac disease or other etiologies to explain iron deficiency anemia.  She was started on Entocort 9 mg daily . Patient is here for follow-up.  Interval History:  Patient has seen some improvement with Entocort.  Her bowel movements are less frequent and she has noticed some improvement in the consistency of the stool.  She is requiring less Lomotil now than before starting Budesonide.   Patient has been having acid reflux symptoms a few times a week. She hasn't been on GERD medication in many years.   Previous Endoscopic Evaluations / Pertinent Studies:   02/10/2020 EGD and colonoscopy for evaluation of iron deficiency anemia and worsening chronic diarrhea --One benign-appearing, intrinsic, mild, non obstructing stenosis was found at the gastroesophageal junction. This stenosis measured 1.5 cm (inner diameter) x less than one cm (in length). The stenosis was traversed.  Exam otherwise normal.  Duodenal biopsies taken to rule out celiac disease.  Duodenal biopsies negative  --One benign-appearing, intrinsic, mild, non obstructing stenosis was found at the gastroesophageal junction. This stenosis measured 1.5 cm (inner diameter) x less than one cm (in length). The stenosis was traversed.  Random colon biopsies compatible with lymphocytic colitis   Past Medical History:  Diagnosis Date   Arthritis    back, fingers with joint pain and swelling.  chronic back pain   Cataract    Chronic back pain    arthritis    Diverticulosis    Elevated cholesterol    takes Niacin daily and Simvastatin   GERD (  gastroesophageal reflux disease) 06/2004   non-specific gastritis on EGD 06/2004   Headache(784.0)    occasionally   History of colon polyps 2004, 2009   2004:adenomatous. 2009 hyperplastic.    History of hiatal hernia    Hypertension    takes Tenoretic and Lisinopril daily   Mucoid cyst of joint 08/2013   right index finger    Osteopenia 01/2014   T score -1.1 FRAX 14%/0.5%. Stable from prior DEXA   PONV (postoperative nausea and vomiting)    Rotator cuff arthropathy    Left   Seasonal allergies    Stroke ([place]) 1998   x 2 - mild left-sided weakness   Urge incontinence    Uterine prolapse     Current Medications, Allergies, Past Surgical History, Family History and Social History were reviewed in [employer] record.   Current Outpatient Medications  Medication Sig Dispense Refill   amoxicillin (AMOXIL) 500 MG tablet Take 2 grams (4 tablets) one hour prior to procedure. 12 tablet 0   Ascorbic Acid (VITAMIN C) 1000 MG tablet Take 1,000 mg by mouth daily.      aspirin 81 MG EC tablet Take 81 mg by mouth 2 (two) times daily.      atenolol-chlorthalidone (TENORETIC) 50-25 MG tablet Take 1 tablet by mouth daily. 90 tablet 3   b complex vitamins tablet Take 1 tablet by mouth daily.      budesonide (ENTOCORT EC) 3 MG 24 hr capsule Take 3 capsules (9 mg total) by mouth daily. 90 capsule 2   Calcium Carbonate-Vitamin D (CALCIUM + D PO) Take 1 tablet by mouth daily.      Cholecalciferol (D 5000) 5000 units capsule Take 5,000 Units by mouth daily.      Chromium 1 MG CAPS Take 1 mg by mouth daily.      Coenzyme Q10 (COQ10) 100 MG CAPS Take 100 mg by mouth every evening.      diphenoxylate-atropine (LOMOTIL) 2.5-0.025 MG tablet Take 1 tablet by mouth 4 (four) times daily as needed for diarrhea or loose stools. TAKE 1 TABLET BY MOUTH 4 TIMES DAILY AS NEEDED FOR DIARRHEA OR LOOSE STOOLS 80 tablet 2   folic acid (FOLVITE) 917 MCG tablet Take 800 mcg by mouth every evening.      hyoscyamine (LEVSIN SL) 0.125 MG SL tablet Take 1-2 tablets by mouth every 4 hours as needed 90 tablet 11   ketotifen (ZADITOR) 0.025 % ophthalmic solution Place 2 drops into both eyes as needed (allergies).     lisinopril (ZESTRIL) 2.5 MG tablet Take 1 tablet (2.5 mg total) by mouth daily. 90 tablet 3    Magnesium 250 MG TABS Take 250 mg by mouth 2 (two) times daily.      Melatonin 3 MG TABS Take 3 mg by mouth at bedtime.      methocarbamol (ROBAXIN) 500 MG tablet TAKE 1 TABLET BY MOUTH EVERY 8 HOURS AS NEEDED FOR MUSCLE SPASMS 90 tablet 3   Multiple Vitamin (MULTIVITAMIN) tablet Take 1 tablet by mouth daily.       OVER THE COUNTER MEDICATION Tumeric cucuremin 433m daily     OVER THE COUNTER MEDICATION Lemon Balm 5 drops daily     OVER THE COUNTER MEDICATION Cats Claw liquid daily     oxyCODONE (OXY IR/ROXICODONE) 5 MG immediate release tablet Take 1 tablet (5 mg total) by mouth every 6 (six) hours as needed for severe pain. 20 tablet 0   oxyCODONE-acetaminophen (PERCOCET/ROXICET) 5-325 MG tablet  Take 1 tablet by mouth every 6 (six) hours as needed for severe pain. 20 tablet 0   potassium chloride SA (KLOR-CON M20) 20 MEQ tablet Take 1 tablet (20 mEq total) by mouth 2 (two) times daily. 180 tablet 3   Probiotic Product (PROBIOTIC DAILY PO) Take 1 capsule by mouth daily.      simvastatin (ZOCOR) 20 MG tablet Take 1 tablet (20 mg total) by mouth daily. 90 tablet 3   vitamin E 400 UNIT capsule Take 400 Units by mouth daily.      Zinc Sulfate (ZINC 15 PO) Take 1 tablet by mouth daily. Liquid Blend 10 drops equal 15 mg     No current facility-administered medications for this visit.    Review of Systems: No chest pain. No shortness of breath. No urinary complaints.   PHYSICAL EXAM :    Wt Readings from Last 3 Encounters:  02/25/20 172 lb (78 kg)  02/24/20 173 lb (78.5 kg)  02/10/20 175 lb (79.4 kg)    BP 118/70    Pulse 65    Ht 5' 2"  (1.575 m)    Wt 172 lb (78 kg)    LMP 02/09/2005    BMI 31.46 kg/m  Constitutional:  Pleasant female in no acute distress. Psychiatric: Normal mood and affect. Behavior is normal. EENT: Pupils normal.  Conjunctivae are normal. No scleral icterus. Neck supple.  Cardiovascular: Normal rate, regular rhythm. No edema Pulmonary/chest: Effort  normal and breath sounds normal. No wheezing, rales or rhonchi. Abdominal: Soft, nondistended, nontender. Bowel sounds active throughout. There are no masses palpable. . Neurological: Alert and oriented to person place and time. Skin: Skin is warm and dry. No rashes noted.  Tye Savoy, NP  02/25/2020, 1:27 PM

## 2020-02-26 ENCOUNTER — Telehealth: Payer: Self-pay

## 2020-02-26 NOTE — Telephone Encounter (Signed)
Patient is advised.  

## 2020-02-26 NOTE — Telephone Encounter (Signed)
-----   Message from Willia Craze, NP sent at 02/25/2020  7:52 PM EST ----- Eustaquio Maize, please call patient. I spoke with Dr. Fuller Plan, he wants her to take ferrous sulfate 325 mg daily. Can constipate so may need stool softener. Please ask her to come for CBC, ferritin and tibc in 6 months. Thanks

## 2020-03-03 ENCOUNTER — Ambulatory Visit: Payer: Medicare Other | Attending: Internal Medicine

## 2020-03-03 ENCOUNTER — Other Ambulatory Visit: Payer: Self-pay | Admitting: Internal Medicine

## 2020-03-03 DIAGNOSIS — Z23 Encounter for immunization: Secondary | ICD-10-CM

## 2020-03-03 NOTE — Progress Notes (Signed)
   Covid-19 Vaccination Clinic  Name:  [name]    MRN: [id number] DOB: 11-27-1950  03/03/2020  [name] was observed post Covid-19 immunization for 15 minutes without incident. She was provided with Vaccine Information Sheet and instruction to access the V-Safe system.   [name] was instructed to call 911 with any severe reactions post vaccine: Marland Kitchen Difficulty breathing  . Swelling of face and throat  . A fast heartbeat  . A bad rash all over body  . Dizziness and weakness   Immunizations Administered    Name Date Dose VIS Date Route   Pfizer COVID-19 Vaccine 03/03/2020  1:51 PM 0.3 mL 01/29/2020 Intramuscular   Manufacturer: [place]   Lot: [id number]   [place]: [id number]

## 2020-03-12 ENCOUNTER — Other Ambulatory Visit: Payer: Self-pay | Admitting: Internal Medicine

## 2020-03-16 ENCOUNTER — Ambulatory Visit
Admission: RE | Admit: 2020-03-16 | Discharge: 2020-03-16 | Disposition: A | Discharge status: Home / Self Care | Payer: Medicare Other | Source: Ambulatory Visit | Attending: Internal Medicine | Admitting: Internal Medicine

## 2020-03-16 DIAGNOSIS — R911 Solitary pulmonary nodule: Secondary | ICD-10-CM | POA: Diagnosis not present

## 2020-03-16 DIAGNOSIS — I700 Atherosclerosis of aorta: Secondary | ICD-10-CM | POA: Diagnosis not present

## 2020-03-16 DIAGNOSIS — R918 Other nonspecific abnormal finding of lung field: Secondary | ICD-10-CM | POA: Diagnosis not present

## 2020-03-17 ENCOUNTER — Other Ambulatory Visit: Payer: Self-pay | Admitting: Internal Medicine

## 2020-03-17 ENCOUNTER — Encounter: Payer: Self-pay | Admitting: Internal Medicine

## 2020-03-17 DIAGNOSIS — I700 Atherosclerosis of aorta: Secondary | ICD-10-CM | POA: Insufficient documentation

## 2020-03-17 NOTE — Assessment & Plan Note (Signed)
On simvastatin advised to continue.

## 2020-05-01 ENCOUNTER — Telehealth: Payer: Self-pay

## 2020-05-01 NOTE — Telephone Encounter (Signed)
Called patient and gave Budesonide taper per Dr. Fuller Plan. Patient has enough pills left to finish taper. Asked her to call us if diarrhea increases. Scheduled office visit with Dr. Fuller Plan on 07/01/20

## 2020-05-08 ENCOUNTER — Other Ambulatory Visit: Payer: Self-pay | Admitting: Gastroenterology

## 2020-05-28 ENCOUNTER — Other Ambulatory Visit: Payer: Self-pay | Admitting: Gastroenterology

## 2020-05-29 NOTE — Telephone Encounter (Signed)
OK to refill

## 2020-05-29 NOTE — Telephone Encounter (Signed)
Dr. Fuller Plan, is it ok to refill Lomotil?

## 2020-05-29 NOTE — Telephone Encounter (Signed)
I faxed prescription to pharmacy.

## 2020-05-29 NOTE — Telephone Encounter (Signed)
OK to refill however for me to fill it requires my controlled substance interface [place] which is not functioning

## 2020-06-02 ENCOUNTER — Other Ambulatory Visit: Payer: Self-pay | Admitting: Gastroenterology

## 2020-07-01 ENCOUNTER — Other Ambulatory Visit: Payer: Self-pay

## 2020-07-01 ENCOUNTER — Encounter: Payer: Self-pay | Admitting: Gastroenterology

## 2020-07-01 ENCOUNTER — Other Ambulatory Visit (INDEPENDENT_AMBULATORY_CARE_PROVIDER_SITE_OTHER): Payer: Medicare Other

## 2020-07-01 ENCOUNTER — Ambulatory Visit (INDEPENDENT_AMBULATORY_CARE_PROVIDER_SITE_OTHER): Payer: Medicare Other | Admitting: Gastroenterology

## 2020-07-01 VITALS — BP 100/70 | HR 76 | Ht 61.5 in | Wt 163.1 lb

## 2020-07-01 DIAGNOSIS — K52832 Lymphocytic colitis: Secondary | ICD-10-CM

## 2020-07-01 DIAGNOSIS — D509 Iron deficiency anemia, unspecified: Secondary | ICD-10-CM | POA: Diagnosis not present

## 2020-07-01 LAB — CBC WITH DIFFERENTIAL/PLATELET
Basophils Absolute: 0.1 10*3/uL (ref 0.0–0.1)
Basophils Relative: 1.1 % (ref 0.0–3.0)
Eosinophils Absolute: 0.4 10*3/uL (ref 0.0–0.7)
Eosinophils Relative: 5.8 % — ABNORMAL HIGH (ref 0.0–5.0)
HCT: 38.7 % (ref 36.0–46.0)
Hemoglobin: 12.9 g/dL (ref 12.0–15.0)
Lymphocytes Relative: 29.2 % (ref 12.0–46.0)
Lymphs Abs: 2.2 10*3/uL (ref 0.7–4.0)
MCHC: 33.4 g/dL (ref 30.0–36.0)
MCV: 86.1 fl (ref 78.0–100.0)
Monocytes Absolute: 0.8 10*3/uL (ref 0.1–1.0)
Monocytes Relative: 10.2 % (ref 3.0–12.0)
Neutro Abs: 4.1 10*3/uL (ref 1.4–7.7)
Neutrophils Relative %: 53.7 % (ref 43.0–77.0)
Platelets: 197 10*3/uL (ref 150.0–400.0)
RBC: 4.49 Mil/uL (ref 3.87–5.11)
RDW: 15.4 % (ref 11.5–15.5)
WBC: 7.7 10*3/uL (ref 4.0–10.5)

## 2020-07-01 LAB — IBC + FERRITIN
Ferritin: 9.7 ng/mL — ABNORMAL LOW (ref 10.0–291.0)
Iron: 45 ug/dL (ref 42–145)
Saturation Ratios: 9.8 % — ABNORMAL LOW (ref 20.0–50.0)
Transferrin: 329 mg/dL (ref 212.0–360.0)

## 2020-07-01 MED ORDER — IRON 325 (65 FE) MG PO TABS: 325.0000 mg | ORAL_TABLET | Freq: Two times a day (BID) | ORAL | 0 refills | Status: DC

## 2020-07-01 NOTE — Progress Notes (Signed)
    History of Present Illness: This is a 70 year old female with lymphocytic colitis and IDA. She completed her budesonide taper 2 weeks ago. Diarrhea is currently well controlled. She is working with a dietitian for food related symptoms. She complains of joint pain in both hands and advised PCP follow up.   Current Medications, Allergies, Past Medical History, Past Surgical History, Family History and Social History were reviewed in [employer] record.   Physical Exam: General: Well developed, well nourished, no acute distress Head: Normocephalic and atraumatic Eyes: Sclerae anicteric, EOMI Ears: Normal auditory acuity Mouth: Not examined, mask on during Covid-19 pandemic Lungs: Clear throughout to auscultation Heart: Regular rate and rhythm; no murmurs, rubs or bruits Abdomen: Soft, non tender and non distended. No masses, hepatosplenomegaly or hernias noted. Normal Bowel sounds Rectal: Not done Musculoskeletal: Symmetrical with no gross deformities  Pulses:  Normal pulses noted Extremities: No clubbing, cyanosis, edema or deformities noted Neurological: Alert oriented x 4, grossly nonfocal Psychological:  Alert and cooperative. Normal mood and affect   Assessment and Recommendations:  1. Lymphocytic colitis. Resume budesonide if diarrhea recurs. We do so with a phone if there is a wait for an office appt.   2. IDA. Continue iron for now. CBC, Fe, TIBC, ferritin today. Follow up with PCP for ongoing mgmt of this problem.

## 2020-07-01 NOTE — Patient Instructions (Addendum)
Your provider has requested that you go to the basement level for lab work before leaving today. Press "B" on the elevator. The lab is located at the first door on the left as you exit the elevator.  Call our office if your diarrhea returns.   Due to recent changes in healthcare laws, you may see the results of your imaging and laboratory studies on MyChart before your provider has had a chance to review them.  We understand that in some cases there may be results that are confusing or concerning to you. Not all laboratory results come back in the same time frame and the provider may be waiting for multiple results in order to interpret others.  Please give [place] 48 hours in order for your provider to thoroughly review all the results before contacting the office for clarification of your results.   Thank you for choosing me and [facility].  Pricilla Riffle. Dagoberto Ligas., MD., [facility] Regal

## 2020-07-22 ENCOUNTER — Other Ambulatory Visit (HOSPITAL_COMMUNITY): Payer: Self-pay

## 2020-09-07 ENCOUNTER — Other Ambulatory Visit: Payer: Self-pay | Admitting: Internal Medicine

## 2020-09-23 DIAGNOSIS — S29019A Strain of muscle and tendon of unspecified wall of thorax, initial encounter: Secondary | ICD-10-CM | POA: Diagnosis not present

## 2020-09-23 DIAGNOSIS — M546 Pain in thoracic spine: Secondary | ICD-10-CM | POA: Diagnosis not present

## 2020-10-05 ENCOUNTER — Ambulatory Visit (INDEPENDENT_AMBULATORY_CARE_PROVIDER_SITE_OTHER): Payer: Medicare Other | Admitting: Internal Medicine

## 2020-10-05 ENCOUNTER — Encounter: Payer: Self-pay | Admitting: Internal Medicine

## 2020-10-05 ENCOUNTER — Other Ambulatory Visit: Payer: Self-pay

## 2020-10-05 VITALS — BP 124/80 | HR 70 | Temp 98.1°F | Resp 18 | Ht 61.5 in | Wt 153.4 lb

## 2020-10-05 DIAGNOSIS — I700 Atherosclerosis of aorta: Secondary | ICD-10-CM

## 2020-10-05 DIAGNOSIS — R0789 Other chest pain: Secondary | ICD-10-CM | POA: Diagnosis not present

## 2020-10-05 DIAGNOSIS — E611 Iron deficiency: Secondary | ICD-10-CM

## 2020-10-05 LAB — CBC
HCT: 35.9 % — ABNORMAL LOW (ref 36.0–46.0)
Hemoglobin: 11.5 g/dL — ABNORMAL LOW (ref 12.0–15.0)
MCHC: 32.1 g/dL (ref 30.0–36.0)
MCV: 83.5 fl (ref 78.0–100.0)
Platelets: 219 10*3/uL (ref 150.0–400.0)
RBC: 4.3 Mil/uL (ref 3.87–5.11)
RDW: 15.2 % (ref 11.5–15.5)
WBC: 8.4 10*3/uL (ref 4.0–10.5)

## 2020-10-05 LAB — FERRITIN: Ferritin: 10.6 ng/mL (ref 10.0–291.0)

## 2020-10-05 NOTE — Progress Notes (Signed)
   Subjective:   Patient ID: [name], female    DOB: 12/21/1950, 70 y.o.   MRN: [id number]  HPI The patient is a 70 YO female coming in for mostly left shoulder pain which once or twice has been in the left chest and through to the back. She saw orthopedics and they wanted her to be evaluated. She is also due for repeat iron levels and wants to know if she can get those done here today. Denies current pain. Pain is not provoked by activity. Often while sitting but random.   Review of Systems  Constitutional: Negative.   HENT: Negative.    Eyes: Negative.   Respiratory:  Negative for cough, chest tightness and shortness of breath.   Cardiovascular:  Positive for chest pain. Negative for palpitations and leg swelling.  Gastrointestinal:  Negative for abdominal distention, abdominal pain, constipation, diarrhea, nausea and vomiting.  Musculoskeletal:  Positive for arthralgias and myalgias.  Skin: Negative.   Neurological: Negative.   Psychiatric/Behavioral: Negative.     Objective:  Physical Exam Constitutional:      Appearance: She is well-developed.  HENT:     Head: Normocephalic and atraumatic.  Cardiovascular:     Rate and Rhythm: Normal rate and regular rhythm.  Pulmonary:     Effort: Pulmonary effort is normal. No respiratory distress.     Breath sounds: Normal breath sounds. No wheezing or rales.  Chest:     Chest wall: No tenderness.  Abdominal:     General: Bowel sounds are normal. There is no distension.     Palpations: Abdomen is soft.     Tenderness: There is no abdominal tenderness. There is no rebound.  Musculoskeletal:        General: Tenderness present.     Cervical back: Normal range of motion.     Comments: Left shoulder tenderness  Skin:    General: Skin is warm and dry.  Neurological:     Mental Status: She is alert and oriented to person, place, and time.     Coordination: Coordination normal.    Vitals:   10/05/20 1426  BP: 124/80  Pulse:  70  Resp: 18  Temp: 98.1 F (36.7 C)  TempSrc: Oral  SpO2: 97%  Weight: 153 lb 6.4 oz (69.6 kg)  Height: 5' 1.5" (1.562 m)   EKG: Rate 59, axis normal, interval normal, sinus, no st or t wave changes, no significant change compared to prior 2020   This visit occurred during the SARS-CoV-2 public health emergency.  Safety protocols were in place, including screening questions prior to the visit, additional usage of staff PPE, and extensive cleaning of exam room while observing appropriate contact time as indicated for disinfecting solutions.   Assessment & Plan:

## 2020-10-05 NOTE — Patient Instructions (Signed)
The EKG is normal and we will check the labs today.

## 2020-10-07 ENCOUNTER — Ambulatory Visit (INDEPENDENT_AMBULATORY_CARE_PROVIDER_SITE_OTHER): Payer: Medicare Other | Admitting: Orthopedic Surgery

## 2020-10-07 ENCOUNTER — Other Ambulatory Visit: Payer: Self-pay

## 2020-10-07 DIAGNOSIS — M542 Cervicalgia: Secondary | ICD-10-CM | POA: Diagnosis not present

## 2020-10-07 DIAGNOSIS — R0789 Other chest pain: Secondary | ICD-10-CM | POA: Insufficient documentation

## 2020-10-07 DIAGNOSIS — E611 Iron deficiency: Secondary | ICD-10-CM | POA: Insufficient documentation

## 2020-10-07 MED ORDER — METHYLPREDNISOLONE 4 MG PO TBPK: ORAL_TABLET | ORAL | 0 refills | Status: DC

## 2020-10-07 NOTE — Assessment & Plan Note (Signed)
EKG done which is unchanged from baseline. Suspect orthopedic cause of the pain and she is pursuing MRI of the shoulder in the near future with orthopedics.

## 2020-10-07 NOTE — Assessment & Plan Note (Signed)
Checking CBC and ferritin. Taking iron daily.

## 2020-10-07 NOTE — Assessment & Plan Note (Signed)
Taking simvastatin daily and we talked about how this lowers her risk for CV disease.

## 2020-10-11 ENCOUNTER — Encounter: Payer: Self-pay | Admitting: Orthopedic Surgery

## 2020-10-11 NOTE — Progress Notes (Signed)
Office Visit Note   Patient: [name]           Date of Birth: 08-Aug-1950           MRN: [id number] Visit Date: 10/07/2020 Requested by: Hoyt Koch, MD [address] [place],  [place] [address] PCP: Hoyt Koch, MD  Subjective: Chief Complaint  Patient presents with   Right Shoulder - Pain    HPI: [name] is a 70 y.o. female who presents to the office complaining of right shoulder and neck pain.  Patient states that she has had pain as she first noticed on 09/09/2020 after she was laying on a new neck pillow.  She describes initially anterior and posterior right shoulder pain as well as neck pain but now that pain is improved to the point where she really only has pain between her shoulder blades.  She has been to her chiropractor 4 times with no relief as well as trying massage therapy without any relief.  She denies any radicular pain down her arm currently or any neck pain at this time.  No numbness or tingling in the arm.  Really just the pain between her shoulder blades.  This is slowly improving.  Denies any altered gait or any bowel or bladder incontinence.  She has been doing a home exercise program over the last approximately 4 weeks as well as applying heat and ice..                ROS: All systems reviewed are negative as they relate to the chief complaint within the history of present illness.  Patient denies fevers or chills.  Assessment & Plan: Visit Diagnoses: No diagnosis found.  Plan: Patient is a 70 year old female who presents complaining of medial scapular pain primarily.  This began as right shoulder and neck pain but now has localized to her medial scapular region.  Given that the pain began after laying on a new neck pillow as well as the scapular pain, impression is referred pain from the cervical spine.  Her pain is steadily improving without intervention aside from home exercise program and heat/ice so plan to continue with this  and also will try a steroid Dosepak to see if this will accelerate her recovery.  She does have history of recent MRI cervical spine back in December 2020 that was significant for cervical spondylosis at multiple levels with bilateral foraminal narrowing at C4-C5, C5-C6, C6-C7.  If no improvement with more time and with a steroid Dosepak, could consider epidural steroid injection in the cervical spine.  For now she will continue with Dosepak and home exercise program.  Follow-up in 6 weeks if no improvement.  Follow-Up Instructions: No follow-ups on file.   Orders:  No orders of the defined types were placed in this encounter.  Meds ordered this encounter  Medications   methylPREDNISolone (MEDROL DOSEPAK) 4 MG TBPK tablet    Sig: Take dosepak as directed    Dispense:  21 tablet    Refill:  0      Procedures: No procedures performed   Clinical Data: No additional findings.  Objective: Vital Signs: LMP 02/09/2005   Physical Exam:  Constitutional: Patient appears well-developed HEENT:  Head: Normocephalic Eyes:EOM are normal Neck: Normal range of motion Cardiovascular: Normal rate Pulmonary/chest: Effort normal Neurologic: Patient is alert Skin: Skin is warm Psychiatric: Patient has normal mood and affect  Ortho Exam: Ortho exam demonstrates cervical spine with no tenderness through the  axial cervical spine or paraspinal musculature.  She does have negative Spurling sign.  5/5 motor strength of bilateral grip strength, finger abduction, pronation/supination, bicep, tricep, deltoid.  Negative Hoffmann sign bilaterally.  Specialty Comments:  No specialty comments available.  Imaging: No results found.   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  chronic back pain   Cataract    Chronic back pain    arthritis    Diverticulosis    Elevated cholesterol    takes Niacin daily and Simvastatin   GERD (gastroesophageal reflux disease) 06/2004   non-specific gastritis on EGD 06/2004   Headache(784.0)    occasionally   History of colon polyps 2004, 2009   2004:adenomatous. 2009 hyperplastic.    History of hiatal hernia    Hypertension    takes Tenoretic and Lisinopril daily   Mucoid cyst of joint 08/2013   right index finger   Osteopenia 01/2014   T score -1.1 FRAX 14%/0.5%. Stable from prior DEXA   PONV (postoperative nausea and vomiting)    Rotator cuff arthropathy    Left   Seasonal allergies    Stroke ([place]) 1998   x 2 - mild left-sided weakness   Urge incontinence    Uterine prolapse     Family History  Problem Relation Age of Onset   Hypertension Mother    Heart disease Mother    Diabetes Father    Hypertension Father    Heart disease Father    Stroke Father    Diabetes Maternal Aunt    Diabetes Paternal Grandmother    Hypertension Paternal Grandfather    Stroke Paternal Grandfather    Colon cancer Neg Hx    Esophageal cancer Neg Hx    Rectal cancer Neg Hx    Stomach cancer Neg Hx     Past Surgical History:  Procedure Laterality Date   BACK SURGERY     x 2   BELPHAROPTOSIS REPAIR Bilateral 12/14/2017   CATARACT EXTRACTION     CATARACT EXTRACTION Bilateral 10/2017   COLONOSCOPY  2004, 2009, 2014   FINGER SURGERY     GUM SURGERY     GYNECOLOGIC CRYOSURGERY     HYSTEROSCOPY WITH D & C   12/07/2010   with resection of endometrial polyp   JOINT REPLACEMENT     KNEE ARTHROSCOPY Left 2008   lip biopsy     done at MD office Fri 11/22/13   MASS EXCISION Right 08/15/2013   Procedure: RIGHT INDEX EXCISION MASS ;  Surgeon: Tennis Must, MD;  Location: [facility];  Service: Orthopedics;  Laterality: Right;   NASAL SEPTUM SURGERY     OOPHORECTOMY Right 2000   REVERSE SHOULDER ARTHROPLASTY Left 12/11/2018   REVERSE SHOULDER ARTHROPLASTY Left 12/11/2018   Procedure: LEFT REVERSE SHOULDER ARTHROPLASTY;  Surgeon: Meredith Pel, MD;  Location: [facility];  Service:  Orthopedics;  Laterality: Left;   SHOULDER ARTHROSCOPY WITH OPEN ROTATOR CUFF REPAIR AND DISTAL CLAVICLE ACROMINECTOMY Right 11/26/2013   Procedure: RIGHT SHOULDER ARTHROSCOPY WITH MINI OPEN ROTATOR CUFF REPAIR AND DISTAL CLAVICLE RESECTION, SUBACROMIAL DECOMPRESSION, POSSIBLE [facility] PATCH.;  Surgeon: [name], MD;  Location: [facility];  Service: Orthopedics;  Laterality: Right;   TOTAL KNEE ARTHROPLASTY Right 03/21/2017   TOTAL KNEE ARTHROPLASTY Right 03/21/2017   Procedure: RIGHT TOTAL KNEE ARTHROPLASTY;  Surgeon: [name], MD;  Location: [facility];  Service: Orthopedics;  Laterality: Right;   TUBAL LIGATION     Social History   Occupational History   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

## 2020-10-14 ENCOUNTER — Telehealth: Payer: Self-pay

## 2020-10-14 DIAGNOSIS — M542 Cervicalgia: Secondary | ICD-10-CM

## 2020-10-14 NOTE — Telephone Encounter (Signed)
Sure thx

## 2020-10-14 NOTE — Telephone Encounter (Signed)
Please advise. Per last office note, it looks like patient was going to do steroid dose pack. Would you like for me to put in referral for injection?  Cervical ESI?

## 2020-10-14 NOTE — Telephone Encounter (Signed)
Patient called she stated Dr.Dean told her to call us back to receive a injection in her neck but she isn't sure which provider Dr.Dean referred her to patient is requesting a call back with the following information call [phone]

## 2020-10-15 NOTE — Telephone Encounter (Signed)
I called patient. Referral entered. She states that she has had injection with Dr. Ernestina Patches before. This helped some, but not significantly.

## 2020-10-15 NOTE — Addendum Note (Signed)
Addended by: Meyer Cory on: 10/15/2020 11:20 AM   Modules accepted: Orders

## 2020-10-16 DIAGNOSIS — Z20822 Contact with and (suspected) exposure to covid-19: Secondary | ICD-10-CM | POA: Diagnosis not present

## 2020-10-27 ENCOUNTER — Other Ambulatory Visit: Payer: Self-pay

## 2020-10-27 ENCOUNTER — Encounter: Payer: Self-pay | Admitting: Physical Medicine and Rehabilitation

## 2020-10-27 ENCOUNTER — Ambulatory Visit (INDEPENDENT_AMBULATORY_CARE_PROVIDER_SITE_OTHER): Payer: Medicare Other | Admitting: Physical Medicine and Rehabilitation

## 2020-10-27 VITALS — BP 100/66 | HR 72

## 2020-10-27 DIAGNOSIS — M47812 Spondylosis without myelopathy or radiculopathy, cervical region: Secondary | ICD-10-CM

## 2020-10-27 DIAGNOSIS — M542 Cervicalgia: Secondary | ICD-10-CM | POA: Diagnosis not present

## 2020-10-27 NOTE — Progress Notes (Signed)
[name] - 70 y.o. female MRN [id number]  Date of birth: 10/15/1950  Office Visit Note: Visit Date: 10/27/2020 PCP: Hoyt Koch, MD Referred by: Hoyt Koch, *  Subjective: Chief Complaint  Patient presents with   Neck - Pain   HPI: [name] is a 70 y.o. female who comes in today per request of Dr. Meredith Pel for evaluation of chronic, worsening, and severe neck pain.  Patient reports neck pain has become progressively worse over the last 2 months. Patient describes pain as soreness, currently rating 8 out of 10. Patient reports pain worsens with activity and laying flat. Patient reports some relief of pain with Tylenol, stretching exercises and application of ice and heat at home. Patient had left C7-T1 interlaminar epidural steroid injection in 2021 which provided her with significant relief for several months. Patient's cervical MRI from 2020 exhibits multi-level facet osteoarthritis with anterolisthesis at C4-C5. Patient did go to physical therapy at [facility] in 2021, however she feels this did not help her pain and possibly made it worse. Patient reports her pain is making it difficult for her to complete tasks at home and verbalizes that she would like to try a repeat injection. Patient denies focal weakness, numbness and tingling. Patient denies recent trauma or falls.   Review of Systems  Musculoskeletal:  Positive for neck pain.  Neurological:  Negative for tingling and focal weakness.  Otherwise per HPI.  Assessment & Plan: Visit Diagnoses:    ICD-10-CM   1. Cervicalgia  M54.2     2. Facet arthritis of cervical region  M47.812     3. Cervical spondylosis without myelopathy  M47.812        Plan: Findings:  Chronic, worsening and severe cervicalgia. Neck pain has become progressively worse over the last several months despite good conservative therapies. Patient's imaging and exam is consistent with facet mediated pain. We  believe the next step would be to perform a diagnostic and therapeutic bilateral C3-C4 and C4-C5 medial branch facet blocks. Patient instructed to take Tylenol 521m four times a day and to continue hot/cold therapy and stretching exercises at home. Radiofrequency ablation discussed briefly with patient and will re-evaluate after injection is completed. No red flag symptoms noted upon exam.    Meds & Orders: No orders of the defined types were placed in this encounter.  No orders of the defined types were placed in this encounter.   Follow-up: Return in about 2 days (around 10/29/2020) for Bilateral C3-C4 and C4-C5 medical branch facet blocks.   Procedures: No procedures performed      Clinical History: MRI CERVICAL SPINE WITHOUT CONTRAST   TECHNIQUE: Multiplanar, multisequence MR imaging of the cervical spine was performed. No intravenous contrast was administered.   COMPARISON:  Radiography 03/06/2019   FINDINGS: Alignment: Normal except for 2 mm degenerative anterolisthesis C4-5.   Vertebrae: No fracture or primary bone lesion.   Cord: No cord compression or primary cord lesion.   Posterior Fossa, vertebral arteries, paraspinal tissues: Normal   Disc levels:   No abnormality at the foramen magnum or C1-2.   C2-3: No disc pathology. Bilateral facet osteoarthritis which could be painful. No compressive stenosis.   C3-4: Mild bulging of the disc. Bilateral facet osteoarthritis. No canal stenosis. Mild bilateral foraminal stenosis.   C4-5: Degenerative spondylosis with endplate osteophytes and bulging of the disc. 2 mm degenerative anterolisthesis. Bilateral facet degeneration and hypertrophy. Canal narrowing with effacement of the subarachnoid space surrounding  the cord but no cord compression. Bilateral foraminal stenosis that could compress either C5 nerve, worse on the right than left.   C5-6: Spondylosis with endplate osteophytes and bulging of the disc. Narrowing of  the ventral subarachnoid space but no compression of the cord. Bilateral foraminal narrowing could affect either C6 nerve.   C6-7: Endplate osteophytes and bulging of the disc. Narrowing of the ventral subarachnoid space but no compression of the cord. Bilateral foraminal narrowing could affect either C7 nerve.   C7-T1: Bulging of the disc. Mild facet degeneration. No compressive canal or foraminal narrowing.   IMPRESSION: C2-3: Bilateral facet osteoarthritis which could be painful. No neural compression.   C3-4: Bilateral facet osteoarthritis with mild foraminal narrowing but no gross neural compression.   C4-5: Facet osteoarthritis with 2 mm of anterolisthesis. Spondylosis. Foraminal narrowing left worse than right that could affect either C5 nerve. Canal narrowing with effacement of the subarachnoid space but no demonstrable cord compression.   C5-6: Spondylosis with bilateral foraminal narrowing that could affect either C6 nerve.   C6-7: Spondylosis with bilateral foraminal narrowing that could affect either C7 nerve.     Electronically Signed   By: Nelson Chimes M.D.   On: 03/17/2019 08:53   She reports that she is a non-smoker but has been exposed to tobacco smoke. She has never used smokeless tobacco. No results for input(s): HGBA1C, LABURIC in the last 8760 hours.  Objective:  VS:  HT:    WT:   BMI:     BP:100/66  HR:72bpm  TEMP: ( )  RESP:  Physical Exam HENT:     Head: Normocephalic.     Right Ear: Tympanic membrane normal.     Left Ear: Tympanic membrane normal.     Nose: Nose normal.     Mouth/Throat:     Mouth: Mucous membranes are moist.  Eyes:     Pupils: Pupils are equal, round, and reactive to light.  Cardiovascular:     Rate and Rhythm: Normal rate.     Pulses: Normal pulses.  Pulmonary:     Effort: Pulmonary effort is normal.  Abdominal:     General: There is no distension.  Musculoskeletal:     Cervical back: Tenderness present.      Comments: Discomfort noted with flexion, extension and side-to-side rotation. Good strength noted to bilateral upper extremities. Sensation intact bilaterally. Negative Hoffman's sign.    Skin:    General: Skin is warm and dry.     Capillary Refill: Capillary refill takes less than 2 seconds.  Neurological:     General: No focal deficit present.     Mental Status: She is alert.  Psychiatric:        Mood and Affect: Mood normal.    Ortho Exam  Imaging: No results found.  Past Medical/Family/Surgical/Social History: Medications & Allergies reviewed per EMR, new medications updated. 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  chronic back pain   Cataract    Chronic back pain    arthritis    Diverticulosis    Elevated cholesterol    takes Niacin daily and Simvastatin   GERD (gastroesophageal reflux disease) 06/2004   non-specific gastritis on EGD 06/2004   Headache(784.0)    occasionally   History of colon polyps 2004, 2009   2004:adenomatous. 2009 hyperplastic.    History of hiatal hernia    Hypertension    takes Tenoretic and Lisinopril daily   Mucoid cyst of joint 08/2013   right index finger   Osteopenia 01/2014   T score -1.1 FRAX 14%/0.5%. Stable from prior DEXA   PONV (postoperative nausea and vomiting)    Rotator cuff arthropathy    Left    Seasonal allergies    Stroke ([place]) 1998   x 2 - mild left-sided weakness   Urge incontinence    Uterine prolapse    Family History  Problem Relation Age of Onset   Hypertension Mother    Heart disease Mother    Diabetes Father    Hypertension Father    Heart disease Father    Stroke Father    Diabetes Maternal Aunt    Diabetes Paternal Grandmother    Hypertension Paternal Grandfather    Stroke Paternal Grandfather    Colon cancer Neg Hx    Esophageal cancer Neg Hx    Rectal cancer Neg Hx    Stomach cancer Neg Hx    Past Surgical History:  Procedure Laterality Date   BACK SURGERY     x 2   BELPHAROPTOSIS REPAIR Bilateral 12/14/2017   CATARACT EXTRACTION     CATARACT EXTRACTION Bilateral 10/2017   COLONOSCOPY  2004, 2009, 2014   FINGER SURGERY     GUM SURGERY     GYNECOLOGIC CRYOSURGERY     HYSTEROSCOPY WITH D & C  12/07/2010   with resection of endometrial polyp   JOINT REPLACEMENT     KNEE ARTHROSCOPY Left 2008   lip biopsy     done at MD office Fri 11/22/13   MASS EXCISION Right 08/15/2013   Procedure: RIGHT INDEX EXCISION MASS ;  Surgeon: Tennis Must, MD;  Location: [place];  Service: Orthopedics;  Laterality: Right;   NASAL SEPTUM SURGERY     OOPHORECTOMY Right 2000   REVERSE SHOULDER ARTHROPLASTY Left 12/11/2018   REVERSE SHOULDER ARTHROPLASTY Left 12/11/2018   Procedure: LEFT REVERSE SHOULDER ARTHROPLASTY;  Surgeon: Meredith Pel, MD;  Location: [place];  Service: Orthopedics;  Laterality: Left;   SHOULDER ARTHROSCOPY WITH OPEN ROTATOR CUFF REPAIR AND DISTAL CLAVICLE ACROMINECTOMY Right 11/26/2013   Procedure: RIGHT SHOULDER ARTHROSCOPY WITH MINI OPEN ROTATOR CUFF REPAIR AND DISTAL CLAVICLE RESECTION, SUBACROMIAL DECOMPRESSION, POSSIBLE [facility] PATCH.;  Surgeon: [name], MD;  Location: [place];  Service: Orthopedics;  Laterality: Right;   TOTAL KNEE ARTHROPLASTY Right 03/21/2017   TOTAL KNEE ARTHROPLASTY Right 03/21/2017   Procedure:  RIGHT TOTAL KNEE ARTHROPLASTY;  Surgeon: [name], MD;  Location: [place];  Service: Orthopedics;  Laterality: Right;   TUBAL LIGATION     Social History   Occupational History   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

## 2020-10-27 NOTE — Progress Notes (Signed)
Had an episode of stabbing pain in scapula. Neck pain worse in center. No pain in shoulders or arms. Pain is worse when first getting up in the mornings. Numeric Pain Rating Scale and Functional Assessment Average Pain 8   In the last MONTH (on 0-10 scale) has pain interfered with the following?  1. General activity like being  able to carry out your everyday physical activities such as walking, climbing stairs, carrying groceries, or moving a chair?  Rating(7)

## 2020-10-29 ENCOUNTER — Ambulatory Visit: Payer: Self-pay

## 2020-10-29 ENCOUNTER — Ambulatory Visit (INDEPENDENT_AMBULATORY_CARE_PROVIDER_SITE_OTHER): Payer: Medicare Other | Admitting: Physical Medicine and Rehabilitation

## 2020-10-29 ENCOUNTER — Encounter: Payer: Self-pay | Admitting: Physical Medicine and Rehabilitation

## 2020-10-29 ENCOUNTER — Other Ambulatory Visit: Payer: Self-pay

## 2020-10-29 VITALS — BP 113/74 | HR 73

## 2020-10-29 DIAGNOSIS — M47812 Spondylosis without myelopathy or radiculopathy, cervical region: Secondary | ICD-10-CM | POA: Diagnosis not present

## 2020-10-29 MED ORDER — BETAMETHASONE SOD PHOS & ACET 6 (3-3) MG/ML IJ SUSP
12.0000 mg | Freq: Once | INTRAMUSCULAR | Status: AC
  Administered 2020-10-29: 12 mg

## 2020-10-29 MED ORDER — BUPIVACAINE HCL 0.25 % IJ SOLN
2.0000 mL | Freq: Once | INTRAMUSCULAR | Status: AC
  Administered 2020-10-29: 2 mL

## 2020-10-29 NOTE — Progress Notes (Signed)
Pt state neck pain. Pt state any movement with her neck makes the pain worse. Pt state she takes over the counter pain meds and uses heating and ice to help ease her pain.  Numeric Pain Rating Scale and Functional Assessment Average Pain 6   In the last MONTH (on 0-10 scale) has pain interfered with the following?  1. General activity like being  able to carry out your everyday physical activities such as walking, climbing stairs, carrying groceries, or moving a chair?  Rating(9)   +Driver, -BT, -Dye Allergies.

## 2020-10-29 NOTE — Patient Instructions (Signed)

## 2020-10-30 NOTE — Procedures (Signed)
Diagnostic Cervical Facet Joint Nerve Block with Fluoroscopic Guidance  Patient: [name]      Date of Birth: 12-Jan-1951 MRN: [id number] PCP: Hoyt Koch, MD      Visit Date: 10/29/2020   Universal Protocol:    Date/Time: 07/22/228:34 AM  Consent Given By: the patient  Position: PRONE  Additional Comments: Vital signs were monitored before and after the procedure. Patient was prepped and draped in the usual sterile fashion. The correct patient, procedure, and site was verified.   Injection Procedure Details:   Procedure diagnoses: Facet arthritis of cervical region [M47.812]   Meds Administered:  Meds ordered this encounter  Medications   betamethasone acetate-betamethasone sodium phosphate (CELESTONE) injection 12 mg   bupivacaine (MARCAINE) 0.25 % (with pres) injection 2 mL     Laterality: Bilateral  Location/Site:  C3-4 C4-5  Needle size: 25 G  Needle type: Spinal  Needle Placement: Articular Pillar  Findings:  -Contrast Used: 0.5 mL iohexol 180 mg iodine/mL   -Comments: Excellent flow of contrast across the articular pillars without intravascular flow  Procedure Details: The fluoroscope beam was positioned to square off the endplates of the desired vertebral level to achieve a true AP position. The beam was then moved in a small "counter" oblique to the contralateral side with a small amount of caudal tilt to achieve a trajectory alignment with the desired nerves.  For each target described below the skin was anesthetized with 1 ml of 1% Lidocaine without epinephrine.    To block the facet joint nerves from C3 through C7, the lateral masses of these respective levels were localized under fluoroscopic visualization.  A spinal needle was inserted down to the "waist" at the above mentioned cervical levels.  The  needle was then "walked off" until it rested just lateral to the trough of the lateral mass of the medial branch nerve, which innervates  the cervical facet joint.   After contact with periosteum and negative aspirate for blood and CSF, correct placement without intravascular or epidural spread was confirmed by Bi-planar images and  injecting 0.5 ml. of Omnipaque-240.  A spot radiograph was obtained of this image.  Next, a 0.5 ml. volume of 1% Lidocaine without Epinephrine was then injected.  Prior to the procedure, the patient was given a Pain Diary which was completed for baseline measurements.  After the procedure, the patient rated their pain every 30 minutes and will continue rating at this frequency for a total of 5 hours.  The patient has been asked to complete the Diary and return to [place] by mail, fax or hand delivered as soon as possible.   Additional Comments:  The patient tolerated the procedure well Dressing: Band-Aid    Post-procedure details: Patient was observed during the procedure. Post-procedure instructions were reviewed.  Patient left the clinic in stable condition.

## 2020-10-30 NOTE — Progress Notes (Signed)
[name] - 69 y.o. female MRN [id number]  Date of birth: 01/29/1951  Office Visit Note: Visit Date: 10/29/2020 PCP: Hoyt Koch, MD Referred by: Hoyt Koch, *  Subjective: Chief Complaint  Patient presents with   Neck - Pain   HPI:  [name] is a 70 y.o. female who comes in today for planned Bilateral  C3-4 and C4-5 Cervical facet/medial branch block with fluoroscopic guidance.  The patient has failed conservative care including home exercise, medications, time and activity modification.  This injection will be diagnostic and hopefully therapeutic.  Please see requesting physician notes for further details and justification.  Exam has shown concordant pain with facet joint loading.   ROS Otherwise per HPI.  Assessment & Plan: Visit Diagnoses:    ICD-10-CM   1. Facet arthritis of cervical region  M47.812 XR C-ARM NO REPORT    Facet Injection    betamethasone acetate-betamethasone sodium phosphate (CELESTONE) injection 12 mg    bupivacaine (MARCAINE) 0.25 % (with pres) injection 2 mL      Plan: No additional findings.   Meds & Orders:  Meds ordered this encounter  Medications   betamethasone acetate-betamethasone sodium phosphate (CELESTONE) injection 12 mg   bupivacaine (MARCAINE) 0.25 % (with pres) injection 2 mL    Orders Placed This Encounter  Procedures   Facet Injection   XR C-ARM NO REPORT    Follow-up: Return for Review Pain Diary.   Procedures: No procedures performed  Diagnostic Cervical Facet Joint Nerve Block with Fluoroscopic Guidance  Patient: [name]      Date of Birth: [birth year] MRN: [id number] PCP: Hoyt Koch, MD      Visit Date: 10/29/2020   Universal Protocol:    Date/Time: 07/22/228:34 AM  Consent Given By: the patient  Position: PRONE  Additional Comments: Vital signs were monitored before and after the procedure. Patient was prepped and draped in the usual sterile fashion. The  correct patient, procedure, and site was verified.   Injection Procedure Details:   Procedure diagnoses: Facet arthritis of cervical region [M47.812]   Meds Administered:  Meds ordered this encounter  Medications   betamethasone acetate-betamethasone sodium phosphate (CELESTONE) injection 12 mg   bupivacaine (MARCAINE) 0.25 % (with pres) injection 2 mL     Laterality: Bilateral  Location/Site:  C3-4 C4-5  Needle size: 25 G  Needle type: Spinal  Needle Placement: Articular Pillar  Findings:  -Contrast Used: 0.5 mL iohexol 180 mg iodine/mL   -Comments: Excellent flow of contrast across the articular pillars without intravascular flow  Procedure Details: The fluoroscope beam was positioned to square off the endplates of the desired vertebral level to achieve a true AP position. The beam was then moved in a small "counter" oblique to the contralateral side with a small amount of caudal tilt to achieve a trajectory alignment with the desired nerves.  For each target described below the skin was anesthetized with 1 ml of 1% Lidocaine without epinephrine.    To block the facet joint nerves from C3 through C7, the lateral masses of these respective levels were localized under fluoroscopic visualization.  A spinal needle was inserted down to the "waist" at the above mentioned cervical levels.  The  needle was then "walked off" until it rested just lateral to the trough of the lateral mass of the medial branch nerve, which innervates the cervical facet joint.   After contact with periosteum and negative aspirate for blood and CSF, correct placement  without intravascular or epidural spread was confirmed by Bi-planar images and  injecting 0.5 ml. of Omnipaque-240.  A spot radiograph was obtained of this image.  Next, a 0.5 ml. volume of 1% Lidocaine without Epinephrine was then injected.  Prior to the procedure, the patient was given a Pain Diary which was completed for baseline  measurements.  After the procedure, the patient rated their pain every 30 minutes and will continue rating at this frequency for a total of 5 hours.  The patient has been asked to complete the Diary and return to [place] by mail, fax or hand delivered as soon as possible.   Additional Comments:  The patient tolerated the procedure well Dressing: Band-Aid    Post-procedure details: Patient was observed during the procedure. Post-procedure instructions were reviewed.  Patient left the clinic in stable condition.       Clinical History: MRI CERVICAL SPINE WITHOUT CONTRAST   TECHNIQUE: Multiplanar, multisequence MR imaging of the cervical spine was performed. No intravenous contrast was administered.   COMPARISON:  Radiography 03/06/2019   FINDINGS: Alignment: Normal except for 2 mm degenerative anterolisthesis C4-5.   Vertebrae: No fracture or primary bone lesion.   Cord: No cord compression or primary cord lesion.   Posterior Fossa, vertebral arteries, paraspinal tissues: Normal   Disc levels:   No abnormality at the foramen magnum or C1-2.   C2-3: No disc pathology. Bilateral facet osteoarthritis which could be painful. No compressive stenosis.   C3-4: Mild bulging of the disc. Bilateral facet osteoarthritis. No canal stenosis. Mild bilateral foraminal stenosis.   C4-5: Degenerative spondylosis with endplate osteophytes and bulging of the disc. 2 mm degenerative anterolisthesis. Bilateral facet degeneration and hypertrophy. Canal narrowing with effacement of the subarachnoid space surrounding the cord but no cord compression. Bilateral foraminal stenosis that could compress either C5 nerve, worse on the right than left.   C5-6: Spondylosis with endplate osteophytes and bulging of the disc. Narrowing of the ventral subarachnoid space but no compression of the cord. Bilateral foraminal narrowing could affect either C6 nerve.   C6-7: Endplate osteophytes and bulging of  the disc. Narrowing of the ventral subarachnoid space but no compression of the cord. Bilateral foraminal narrowing could affect either C7 nerve.   C7-T1: Bulging of the disc. Mild facet degeneration. No compressive canal or foraminal narrowing.   IMPRESSION: C2-3: Bilateral facet osteoarthritis which could be painful. No neural compression.   C3-4: Bilateral facet osteoarthritis with mild foraminal narrowing but no gross neural compression.   C4-5: Facet osteoarthritis with 2 mm of anterolisthesis. Spondylosis. Foraminal narrowing left worse than right that could affect either C5 nerve. Canal narrowing with effacement of the subarachnoid space but no demonstrable cord compression.   C5-6: Spondylosis with bilateral foraminal narrowing that could affect either C6 nerve.   C6-7: Spondylosis with bilateral foraminal narrowing that could affect either C7 nerve.     Electronically Signed   By: Nelson Chimes M.D.   On: 03/17/2019 08:53     Objective:  VS:  HT:    WT:   BMI:     BP:113/74  HR:73bpm  TEMP: ( )  RESP:  Physical Exam Vitals and nursing note reviewed.  Constitutional:      General: She is not in acute distress.    Appearance: Normal appearance. She is not ill-appearing.  HENT:     Head: Normocephalic and atraumatic.     Right Ear: External ear normal.     Left Ear: External ear normal.  Eyes:     Extraocular Movements: Extraocular movements intact.  Cardiovascular:     Rate and Rhythm: Normal rate.     Pulses: Normal pulses.  Musculoskeletal:     Cervical back: Tenderness present. No rigidity.     Right lower leg: No edema.     Left lower leg: No edema.     Comments: Patient has good strength in the upper extremities including 5 out of 5 strength in wrist extension long finger flexion and APB.  There is no atrophy of the hands intrinsically.  There is a negative Hoffmann's test.   Lymphadenopathy:     Cervical: No cervical adenopathy.  Skin:     Findings: No erythema, lesion or rash.  Neurological:     General: No focal deficit present.     Mental Status: She is alert and oriented to person, place, and time.     Sensory: No sensory deficit.     Motor: No weakness or abnormal muscle tone.     Coordination: Coordination normal.  Psychiatric:        Mood and Affect: Mood normal.        Behavior: Behavior normal.     Imaging: XR C-ARM NO REPORT  Result Date: 10/29/2020 Please see Notes tab for imaging impression.

## 2020-11-09 ENCOUNTER — Encounter: Payer: Self-pay | Admitting: Internal Medicine

## 2020-11-09 DIAGNOSIS — Z1231 Encounter for screening mammogram for malignant neoplasm of breast: Secondary | ICD-10-CM | POA: Diagnosis not present

## 2020-11-17 ENCOUNTER — Telehealth: Payer: Self-pay | Admitting: Physical Medicine and Rehabilitation

## 2020-11-17 NOTE — Telephone Encounter (Signed)
Pt called wondering if Dr.Newtons office had called her.   [id number]

## 2020-12-12 ENCOUNTER — Other Ambulatory Visit: Payer: Self-pay | Admitting: Internal Medicine

## 2020-12-15 ENCOUNTER — Ambulatory Visit: Payer: Self-pay

## 2020-12-15 ENCOUNTER — Other Ambulatory Visit: Payer: Self-pay

## 2020-12-15 ENCOUNTER — Ambulatory Visit (INDEPENDENT_AMBULATORY_CARE_PROVIDER_SITE_OTHER): Payer: Medicare Other | Admitting: Physical Medicine and Rehabilitation

## 2020-12-15 ENCOUNTER — Encounter: Payer: Self-pay | Admitting: Physical Medicine and Rehabilitation

## 2020-12-15 VITALS — BP 113/75 | HR 96

## 2020-12-15 DIAGNOSIS — M542 Cervicalgia: Secondary | ICD-10-CM

## 2020-12-15 DIAGNOSIS — M47812 Spondylosis without myelopathy or radiculopathy, cervical region: Secondary | ICD-10-CM

## 2020-12-15 MED ORDER — BUPIVACAINE HCL 0.25 % IJ SOLN
2.0000 mL | Freq: Once | INTRAMUSCULAR | Status: AC
  Administered 2020-12-15: 2 mL

## 2020-12-15 NOTE — Patient Instructions (Signed)

## 2020-12-15 NOTE — Procedures (Signed)
Diagnostic Cervical Facet Joint Nerve Block with Fluoroscopic Guidance  Patient: [name]      Date of Birth: [birth year] MRN: [id number] PCP: Hoyt Koch, MD      Visit Date: 12/15/2020   Universal Protocol:    Date/Time: 09/06/222:20 PM  Consent Given By: the patient  Position: PRONE  Additional Comments: Vital signs were monitored before and after the procedure. Patient was prepped and draped in the usual sterile fashion. The correct patient, procedure, and site was verified.   Injection Procedure Details:   Procedure diagnoses: Cervical spondylosis without myelopathy [M47.812]   Meds Administered:  Meds ordered this encounter  Medications   bupivacaine (MARCAINE) 0.25 % (with pres) injection 2 mL     Laterality: Bilateral  Location/Site:  C3-4 C4-5  Needle size: 25 G  Needle type: Spinal  Needle Placement: Articular Pillar  Findings:  -Contrast Used: 0.5 mL iohexol 180 mg iodine/mL   -Comments: Excellent flow of contrast across the articular pillars without intravascular flow  Procedure Details: The fluoroscope beam was positioned to square off the endplates of the desired vertebral level to achieve a true AP position. The beam was then moved in a small "counter" oblique to the contralateral side with a small amount of caudal tilt to achieve a trajectory alignment with the desired nerves.  For each target described below the skin was anesthetized with 1 ml of 1% Lidocaine without epinephrine.    To block the facet joint nerves from C3 through C7, the lateral masses of these respective levels were localized under fluoroscopic visualization.  A spinal needle was inserted down to the "waist" at the above mentioned cervical levels.  The  needle was then "walked off" until it rested just lateral to the trough of the lateral mass of the medial branch nerve, which innervates the cervical facet joint.   After contact with periosteum and negative  aspirate for blood and CSF, correct placement without intravascular or epidural spread was confirmed by Bi-planar images and  injecting 0.5 ml. of Omnipaque-240.  A spot radiograph was obtained of this image.  Next, a 0.5 ml. volume of 1% Lidocaine without Epinephrine was then injected.  Prior to the procedure, the patient was given a Pain Diary which was completed for baseline measurements.  After the procedure, the patient rated their pain every 30 minutes and will continue rating at this frequency for a total of 5 hours.  The patient has been asked to complete the Diary and return to [place] by mail, fax or hand delivered as soon as possible.   Additional Comments:  No complications occurred Dressing:     Post-procedure details: Patient was observed during the procedure. Post-procedure instructions were reviewed.  Patient left the clinic in stable condition.

## 2020-12-15 NOTE — Progress Notes (Signed)
Pt state neck pain. Pt state getting out of bed and turning her heads makes the pain worse. Pt state she takes over the counter pain meds to help ease her pain. Pt has hx of inj on 10/29/20 pt state it helped.  Numeric Pain Rating Scale and Functional Assessment Average Pain 5   In the last MONTH (on 0-10 scale) has pain interfered with the following?  1. General activity like being  able to carry out your everyday physical activities such as walking, climbing stairs, carrying groceries, or moving a chair?  Rating(9)   +Driver, -BT, -Dye Allergies.

## 2020-12-26 NOTE — Progress Notes (Signed)
KEYANA GUEVARA - 70 y.o. female MRN [id number]  Date of birth: [birth year]  Office Visit Note: Visit Date: 12/15/2020 PCP: Hoyt Koch, MD Referred by: Hoyt Koch, *  Subjective: Chief Complaint  Patient presents with   Neck - Pain   HPI:  EVYN KOOYMAN is a 70 y.o. female who comes in today for planned repeat Bilateral L3-4 and L4-5 Cervical facet/medial branch block with fluoroscopic guidance.  The patient has failed conservative care including home exercise, medications, time and activity modification.  This injection will be diagnostic and hopefully therapeutic.  Please see requesting physician notes for further details and justification.  Exam shows concordant neck pain with facet joint loading and extension. Patient received more than 80% pain relief from prior injection. This would be the second block in a diagnostic double block paradigm.     Referring:Dr. Landry Dyke Dean   ROS Otherwise per HPI.  Assessment & Plan: Visit Diagnoses:    ICD-10-CM   1. Cervical spondylosis without myelopathy  M47.812 XR C-ARM NO REPORT    Facet Injection    bupivacaine (MARCAINE) 0.25 % (with pres) injection 2 mL    2. Cervicalgia  M54.2 XR C-ARM NO REPORT    Facet Injection    bupivacaine (MARCAINE) 0.25 % (with pres) injection 2 mL      Plan: No additional findings.   Meds & Orders:  Meds ordered this encounter  Medications   bupivacaine (MARCAINE) 0.25 % (with pres) injection 2 mL    Orders Placed This Encounter  Procedures   Facet Injection   XR C-ARM NO REPORT    Follow-up: Return for Review Pain Diary.   Procedures: No procedures performed  Diagnostic Cervical Facet Joint Nerve Block with Fluoroscopic Guidance  Patient: ALVEENA TAIRA      Date of Birth: March 09, 1951 MRN: [id number] PCP: Hoyt Koch, MD      Visit Date: 12/15/2020   Universal Protocol:    Date/Time: 09/06/222:20 PM  Consent Given By: the patient  Position:  PRONE  Additional Comments: Vital signs were monitored before and after the procedure. Patient was prepped and draped in the usual sterile fashion. The correct patient, procedure, and site was verified.   Injection Procedure Details:   Procedure diagnoses: Cervical spondylosis without myelopathy [M47.812]   Meds Administered:  Meds ordered this encounter  Medications   bupivacaine (MARCAINE) 0.25 % (with pres) injection 2 mL     Laterality: Bilateral  Location/Site:  C3-4 C4-5  Needle size: 25 G  Needle type: Spinal  Needle Placement: Articular Pillar  Findings:  -Contrast Used: 0.5 mL iohexol 180 mg iodine/mL   -Comments: Excellent flow of contrast across the articular pillars without intravascular flow  Procedure Details: The fluoroscope beam was positioned to square off the endplates of the desired vertebral level to achieve a true AP position. The beam was then moved in a small "counter" oblique to the contralateral side with a small amount of caudal tilt to achieve a trajectory alignment with the desired nerves.  For each target described below the skin was anesthetized with 1 ml of 1% Lidocaine without epinephrine.    To block the facet joint nerves from C3 through C7, the lateral masses of these respective levels were localized under fluoroscopic visualization.  A spinal needle was inserted down to the "waist" at the above mentioned cervical levels.  The  needle was then "walked off" until it rested just lateral to the trough of the lateral  mass of the medial branch nerve, which innervates the cervical facet joint.   After contact with periosteum and negative aspirate for blood and CSF, correct placement without intravascular or epidural spread was confirmed by Bi-planar images and  injecting 0.5 ml. of Omnipaque-240.  A spot radiograph was obtained of this image.  Next, a 0.5 ml. volume of 1% Lidocaine without Epinephrine was then injected.  Prior to the procedure,  the patient was given a Pain Diary which was completed for baseline measurements.  After the procedure, the patient rated their pain every 30 minutes and will continue rating at this frequency for a total of 5 hours.  The patient has been asked to complete the Diary and return to [place] by mail, fax or hand delivered as soon as possible.   Additional Comments:  No complications occurred Dressing:     Post-procedure details: Patient was observed during the procedure. Post-procedure instructions were reviewed.  Patient left the clinic in stable condition.       Clinical History: MRI CERVICAL SPINE WITHOUT CONTRAST   TECHNIQUE: Multiplanar, multisequence MR imaging of the cervical spine was performed. No intravenous contrast was administered.   COMPARISON:  Radiography 03/06/2019   FINDINGS: Alignment: Normal except for 2 mm degenerative anterolisthesis C4-5.   Vertebrae: No fracture or primary bone lesion.   Cord: No cord compression or primary cord lesion.   Posterior Fossa, vertebral arteries, paraspinal tissues: Normal   Disc levels:   No abnormality at the foramen magnum or C1-2.   C2-3: No disc pathology. Bilateral facet osteoarthritis which could be painful. No compressive stenosis.   C3-4: Mild bulging of the disc. Bilateral facet osteoarthritis. No canal stenosis. Mild bilateral foraminal stenosis.   C4-5: Degenerative spondylosis with endplate osteophytes and bulging of the disc. 2 mm degenerative anterolisthesis. Bilateral facet degeneration and hypertrophy. Canal narrowing with effacement of the subarachnoid space surrounding the cord but no cord compression. Bilateral foraminal stenosis that could compress either C5 nerve, worse on the right than left.   C5-6: Spondylosis with endplate osteophytes and bulging of the disc. Narrowing of the ventral subarachnoid space but no compression of the cord. Bilateral foraminal narrowing could affect either  C6 nerve.   C6-7: Endplate osteophytes and bulging of the disc. Narrowing of the ventral subarachnoid space but no compression of the cord. Bilateral foraminal narrowing could affect either C7 nerve.   C7-T1: Bulging of the disc. Mild facet degeneration. No compressive canal or foraminal narrowing.   IMPRESSION: C2-3: Bilateral facet osteoarthritis which could be painful. No neural compression.   C3-4: Bilateral facet osteoarthritis with mild foraminal narrowing but no gross neural compression.   C4-5: Facet osteoarthritis with 2 mm of anterolisthesis. Spondylosis. Foraminal narrowing left worse than right that could affect either C5 nerve. Canal narrowing with effacement of the subarachnoid space but no demonstrable cord compression.   C5-6: Spondylosis with bilateral foraminal narrowing that could affect either C6 nerve.   C6-7: Spondylosis with bilateral foraminal narrowing that could affect either C7 nerve.     Electronically Signed   By: Nelson Chimes M.D.   On: 03/17/2019 08:53     Objective:  VS:  HT:    WT:   BMI:     BP:113/75  HR:96bpm  TEMP: ( )  RESP:  Physical Exam Vitals and nursing note reviewed.  Constitutional:      General: She is not in acute distress.    Appearance: Normal appearance. She is not ill-appearing.  HENT:  Head: Normocephalic and atraumatic.     Right Ear: External ear normal.     Left Ear: External ear normal.  Eyes:     Extraocular Movements: Extraocular movements intact.  Cardiovascular:     Rate and Rhythm: Normal rate.     Pulses: Normal pulses.  Musculoskeletal:     Cervical back: Tenderness present. No rigidity.     Right lower leg: No edema.     Left lower leg: No edema.     Comments: Patient has good strength in the upper extremities including 5 out of 5 strength in wrist extension long finger flexion and APB.  There is no atrophy of the hands intrinsically.  There is a negative Hoffmann's test.  She has concordant  neck pain with extension rotation and facet loading.  Decreased range of motion with end ranges of rotation.  She has a negative Spurling's test.   Lymphadenopathy:     Cervical: No cervical adenopathy.  Skin:    Findings: No erythema, lesion or rash.  Neurological:     General: No focal deficit present.     Mental Status: She is alert and oriented to person, place, and time.     Sensory: No sensory deficit.     Motor: No weakness or abnormal muscle tone.     Coordination: Coordination normal.  Psychiatric:        Mood and Affect: Mood normal.        Behavior: Behavior normal.     Imaging: No results found.

## 2021-01-13 ENCOUNTER — Other Ambulatory Visit: Payer: Self-pay

## 2021-01-13 ENCOUNTER — Encounter: Payer: Self-pay | Admitting: Internal Medicine

## 2021-01-13 ENCOUNTER — Ambulatory Visit (INDEPENDENT_AMBULATORY_CARE_PROVIDER_SITE_OTHER): Payer: Medicare Other | Admitting: Internal Medicine

## 2021-01-13 DIAGNOSIS — I10 Essential (primary) hypertension: Secondary | ICD-10-CM | POA: Diagnosis not present

## 2021-01-13 DIAGNOSIS — W540XXA Bitten by dog, initial encounter: Secondary | ICD-10-CM

## 2021-01-13 DIAGNOSIS — S61258A Open bite of other finger without damage to nail, initial encounter: Secondary | ICD-10-CM | POA: Diagnosis not present

## 2021-01-13 MED ORDER — AMOXICILLIN-POT CLAVULANATE 875-125 MG PO TABS: 1.0000 | ORAL_TABLET | Freq: Two times a day (BID) | ORAL | 0 refills | Status: DC

## 2021-01-13 NOTE — Progress Notes (Signed)
Patient ID: [name], female   DOB: [birth year], 70 y.o.   MRN: [id number]        Chief Complaint: follow up dog bite       HPI:  [name] is a 70 y.o. female here with c/o dog bite last pm to the most distal 3rd finger left hand as she was trying to break up an animal confrontation.  Suffered a single but significant puncture wound to the pad, but also a significant abrasion/scratch to the post finger distal to the DIP as well as she pulled the finger away.  Had bleeding to both but worse to the puncture wound, now stopped, but already this am has significantly worsening red, tender, swelling mostly to the pad of the finger despite bandaid.  No fever, chills, red streaks, drainage or other finger involvement, and o/w seems to function ok.  Declines tdap pr xray but hoping for antibiotic  Pt denies chest pain, increased sob or doe, wheezing, orthopnea, PND, increased LE swelling, palpitations, dizziness or syncope.       Wt Readings from Last 3 Encounters:  01/13/21 163 lb (73.9 kg)  10/05/20 153 lb 6.4 oz (69.6 kg)  07/01/20 163 lb 2 oz (74 kg)   BP Readings from Last 3 Encounters:  01/13/21 102/80  12/15/20 113/75  10/29/20 113/74         Past Medical History:  Diagnosis Date   Arthritis    back, fingers with joint pain and swelling.  chronic back pain   Cataract    Chronic back pain    arthritis    Diverticulosis    Elevated cholesterol    takes Niacin daily and Simvastatin   GERD (gastroesophageal reflux disease) 06/2004   non-specific gastritis on EGD 06/2004   Headache(784.0)    occasionally   History of colon polyps 2004, 2009   2004:adenomatous. 2009 hyperplastic.    History of hiatal hernia    Hypertension    takes Tenoretic and Lisinopril daily   Mucoid cyst of joint 08/2013   right index finger   Osteopenia 01/2014   T score -1.1 FRAX 14%/0.5%. Stable from prior DEXA   PONV (postoperative nausea and vomiting)    Rotator cuff arthropathy    Left    Seasonal allergies    Stroke ([place]) 1998   x 2 - mild left-sided weakness   Urge incontinence    Uterine prolapse    Past Surgical History:  Procedure Laterality Date   BACK SURGERY     x 2   BELPHAROPTOSIS REPAIR Bilateral 12/14/2017   CATARACT EXTRACTION     CATARACT EXTRACTION Bilateral 10/2017   COLONOSCOPY  2004, 2009, 2014   FINGER SURGERY     GUM SURGERY     GYNECOLOGIC CRYOSURGERY     HYSTEROSCOPY WITH D & C  12/07/2010   with resection of endometrial polyp   JOINT REPLACEMENT     KNEE ARTHROSCOPY Left 2008   lip biopsy     done at MD office Fri 11/22/13   MASS EXCISION Right 08/15/2013   Procedure: RIGHT INDEX EXCISION MASS ;  Surgeon: Tennis Must, MD;  Location: [place];  Service: Orthopedics;  Laterality: Right;   NASAL SEPTUM SURGERY     OOPHORECTOMY Right 2000   REVERSE SHOULDER ARTHROPLASTY Left 12/11/2018   REVERSE SHOULDER ARTHROPLASTY Left 12/11/2018   Procedure: LEFT REVERSE SHOULDER ARTHROPLASTY;  Surgeon: Meredith Pel, MD;  Location: [place];  Service: Orthopedics;  Laterality: Left;   SHOULDER ARTHROSCOPY WITH OPEN ROTATOR CUFF REPAIR AND DISTAL CLAVICLE ACROMINECTOMY Right 11/26/2013   Procedure: RIGHT SHOULDER ARTHROSCOPY WITH MINI OPEN ROTATOR CUFF REPAIR AND DISTAL CLAVICLE RESECTION, SUBACROMIAL DECOMPRESSION, POSSIBLE [facility] PATCH.;  Surgeon: [name], MD;  Location: [place];  Service: Orthopedics;  Laterality: Right;   TOTAL KNEE ARTHROPLASTY Right 03/21/2017   TOTAL KNEE ARTHROPLASTY Right 03/21/2017   Procedure: RIGHT TOTAL KNEE ARTHROPLASTY;  Surgeon: [name], MD;  Location: [place];  Service: Orthopedics;  Laterality: Right;   TUBAL LIGATION      reports that she is a non-smoker but has been exposed to tobacco smoke. She has never used smokeless tobacco. She reports that she does not currently use alcohol. She reports that she does not use drugs. family history includes Diabetes in her father, maternal aunt, and  paternal grandmother; Heart disease in her father and mother; Hypertension in her father, mother, and paternal grandfather; Stroke in her father and paternal grandfather. Allergies  Allergen Reactions   Eggs Or Egg-Derived Products Other (See Comments)    GI UPSET, JOINT PAIN, DIFF. SWALLOWING   Etodolac Other (See Comments)    ABD. CRAMPING   Fish-Derived Products Other (See Comments)    GI UPSET, JOINT PAIN, DIFF. SWALLOWING    Omeprazole Other (See Comments)    ABD. CRAMPING   Pineapple Other (See Comments)    GI UPSET, JOINT PAIN, DIFF. SWALLOWING   Gluten Meal Diarrhea    GI upset   Current Outpatient Medications on File Prior to Visit  Medication Sig Dispense Refill   amoxicillin (AMOXIL) 500 MG tablet Take 2 grams (4 tablets) one hour prior to procedure. 12 tablet 0   Ascorbic Acid (VITAMIN C) 1000 MG tablet Take 1,000 mg by mouth daily.      aspirin 81 MG EC tablet Take 81 mg by mouth 2 (two) times daily.      atenolol-chlorthalidone (TENORETIC) 50-25 MG tablet Take 1 tablet by mouth daily. 90 tablet 3   b complex vitamins tablet Take 1 tablet by mouth daily.      Calcium Carbonate-Vitamin D (CALCIUM + D PO) Take 1 tablet by mouth daily.      Cholecalciferol (D 5000) 5000 units capsule Take 5,000 Units by mouth daily.      Chromium 1 MG CAPS Take 1 mg by mouth daily.      Coenzyme Q10 (COQ10) 100 MG CAPS Take 100 mg by mouth every evening.      diphenoxylate-atropine (LOMOTIL) 2.5-0.025 MG tablet TAKE 1 TABLET BY MOUTH FOUR TIMES A DAY AS NEEDED FOR DIARRHEA OR LOOSE STOOLS 80 tablet 2   Ferrous Sulfate (IRON) 325 (65 Fe) MG TABS Take 1 tablet (325 mg total) by mouth 2 (two) times daily. 60 tablet 0   folic acid (FOLVITE) 854 MCG tablet Take 800 mcg by mouth every evening.      hyoscyamine (LEVSIN SL) 0.125 MG SL tablet Take 1-2 tablets by mouth every 4 hours as needed 90 tablet 11   ketotifen (ZADITOR) 0.025 % ophthalmic solution Place 2 drops into both eyes as needed  (allergies).     lisinopril (ZESTRIL) 2.5 MG tablet TAKE 1 TABLET DAILY 90 tablet 0   Magnesium 250 MG TABS Take 250 mg by mouth 2 (two) times daily.      Melatonin 3 MG TABS Take 3 mg by mouth at bedtime.      methocarbamol (ROBAXIN) 500 MG tablet TAKE 1 TABLET BY MOUTH EVERY  8 HOURS AS NEEDED FOR MUSCLE SPASMS 90 tablet 3   methylPREDNISolone (MEDROL DOSEPAK) 4 MG TBPK tablet Take dosepak as directed 21 tablet 0   Multiple Vitamin (MULTIVITAMIN) tablet Take 1 tablet by mouth daily.     OVER THE COUNTER MEDICATION Lemon Balm 5 drops daily     OVER THE COUNTER MEDICATION C[place] Claw liquid daily     potassium chloride SA (KLOR-CON M20) 20 MEQ tablet Take 1 tablet (20 mEq total) by mouth 2 (two) times daily. 180 tablet 3   Probiotic Product (PROBIOTIC DAILY PO) Take 1 capsule by mouth daily.      simvastatin (ZOCOR) 20 MG tablet Take 1 tablet (20 mg total) by mouth daily. 90 tablet 3   traMADol (ULTRAM) 50 MG tablet Take 50 mg by mouth every 6 (six) hours as needed.     TURMERIC CURCUMIN PO Take 1 capsule by mouth daily.     vitamin E 400 UNIT capsule Take 400 Units by mouth daily.      Zinc Sulfate (ZINC 15 PO) Take 1 tablet by mouth daily. Liquid Blend 10 drops equal 15 mg     No current facility-administered medications on file prior to visit.        ROS:  All others reviewed and negative.  Objective        PE:  BP 102/80   Pulse 74   Temp 98.3 F (36.8 C) (Oral)   Resp 18   Ht 5' 1.5" (1.562 m)   Wt 163 lb (73.9 kg)   LMP 02/09/2005   SpO2 97%   BMI 30.30 kg/m                 Constitutional: Pt appears in NAD               HENT: Head: NCAT.                Right Ear: External ear normal.                 Left Ear: External ear normal.                Eyes: . Pupils are equal, round, and reactive to light. Conjunctivae and EOM are normal               Nose: without d/c or deformity               Neck: Neck supple. Gross normal ROM               Cardiovascular: Normal rate and  regular rhythm.                 Pulmonary/Chest: Effort normal and breath sounds without rales or wheezing.                               Neurological: Pt is alert. At baseline orientation, motor grossly intact               Skin:  LE edema -none; distal 3rd finger left hand neurovasc intact, but finger distal to the DIP only is 1+ red, tender swelling with puncture wound x 1 to the mid pad without fluctuance or drainage, and several scratch like abrasions to the post finger but not involving the nail               Psychiatric: Pt behavior is normal without agitation  Micro: none  Cardiac tracings I have personally interpreted today:  none  Pertinent Radiological findings (summarize): none   Lab Results  Component Value Date   WBC 8.4 10/05/2020   HGB 11.5 (L) 10/05/2020   HCT 35.9 (L) 10/05/2020   PLT 219.0 10/05/2020   GLUCOSE 88 02/24/2020   CHOL 121 02/24/2020   TRIG 76.0 02/24/2020   HDL 43.50 02/24/2020   LDLCALC 62 02/24/2020   ALT 14 02/24/2020   AST 22 02/24/2020   NA 143 02/24/2020   K 4.2 02/24/2020   CL 105 02/24/2020   CREATININE 0.96 02/24/2020   BUN 19 02/24/2020   CO2 31 02/24/2020   TSH 1.89 12/25/2019   INR 1.08 03/14/2017   HGBA1C 5.7 01/09/2018   Assessment/Plan:  [name] is a 70 y.o. White or Caucasian [1] female with  has a past medical history of Arthritis, Cataract, Chronic back pain, Diverticulosis, Elevated cholesterol, GERD (gastroesophageal reflux disease) (06/2004), Headache(784.0), History of colon polyps (2004, 2009), History of hiatal hernia, Hypertension, Mucoid cyst of joint (08/2013), Osteopenia (01/2014), PONV (postoperative nausea and vomiting), Rotator cuff arthropathy, Seasonal allergies, Stroke ([place]) (1998), Urge incontinence, and Uterine prolapse.  Dog bite of middle finger Distal left middle finger without abscess or tenosynovitis but has onset cellulitis already after bite last pm - for augmentin course asd,  to f/u any  worsening symptoms or concerns  Essential hypertension BP Readings from Last 3 Encounters:  01/13/21 102/80  12/15/20 113/75  10/29/20 113/74   Low normal but asmptomatic, pt to continue medical treatment tenoretic, zestril  Followup: No follow-ups on file.  Cathlean Cower, MD 01/17/2021 1:59 PM [facility]

## 2021-01-13 NOTE — Patient Instructions (Signed)
Please take all new medication as prescribed - the augmentin antibiotic  Please continue all other medications as before, and refills have been done if requested.  Please have the pharmacy call with any other refills you may need.  Please keep your appointments with your specialists as you may have planned

## 2021-01-17 ENCOUNTER — Encounter: Payer: Self-pay | Admitting: Internal Medicine

## 2021-01-17 DIAGNOSIS — S61258A Open bite of other finger without damage to nail, initial encounter: Secondary | ICD-10-CM | POA: Insufficient documentation

## 2021-01-17 DIAGNOSIS — W540XXA Bitten by dog, initial encounter: Secondary | ICD-10-CM | POA: Insufficient documentation

## 2021-01-17 NOTE — Assessment & Plan Note (Signed)
BP Readings from Last 3 Encounters:  01/13/21 102/80  12/15/20 113/75  10/29/20 113/74   Low normal but asmptomatic, pt to continue medical treatment tenoretic, zestril

## 2021-01-17 NOTE — Assessment & Plan Note (Addendum)
Distal left middle finger without abscess or tenosynovitis but has onset cellulitis already after bite last pm - for augmentin course asd,  to f/u any worsening symptoms or concerns

## 2021-01-21 ENCOUNTER — Encounter: Payer: Self-pay | Admitting: Internal Medicine

## 2021-01-22 ENCOUNTER — Telehealth: Payer: Self-pay | Admitting: Internal Medicine

## 2021-01-22 ENCOUNTER — Telehealth (INDEPENDENT_AMBULATORY_CARE_PROVIDER_SITE_OTHER): Payer: Medicare Other | Admitting: Internal Medicine

## 2021-01-22 ENCOUNTER — Encounter: Payer: Self-pay | Admitting: Internal Medicine

## 2021-01-22 DIAGNOSIS — W540XXD Bitten by dog, subsequent encounter: Secondary | ICD-10-CM

## 2021-01-22 DIAGNOSIS — U071 COVID-19: Secondary | ICD-10-CM | POA: Insufficient documentation

## 2021-01-22 DIAGNOSIS — S61258D Open bite of other finger without damage to nail, subsequent encounter: Secondary | ICD-10-CM

## 2021-01-22 MED ORDER — MOLNUPIRAVIR EUA 200MG CAPSULE: 4.0000 | ORAL_CAPSULE | Freq: Two times a day (BID) | ORAL | 0 refills | Status: AC

## 2021-01-22 NOTE — Assessment & Plan Note (Signed)
Rx molnupiravir for disease due to risk status and given information about otc symptom management as well as CDC recommendations for quarantine period.

## 2021-01-22 NOTE — Telephone Encounter (Signed)
Patient COVID+ via at home test..   Mild sore throat, runny nose, headache & bodyaches  Please call  [phone]

## 2021-01-22 NOTE — Assessment & Plan Note (Signed)
Advised to finish antibiotic.

## 2021-01-22 NOTE — Telephone Encounter (Signed)
Can double book for 11 today if possible for virtual

## 2021-01-22 NOTE — Telephone Encounter (Signed)
See below

## 2021-01-22 NOTE — Telephone Encounter (Signed)
Do you want to double book or schedule with someone else? Please advise.

## 2021-01-22 NOTE — Progress Notes (Signed)
Virtual Visit via Video Note  I connected with [name] on 01/22/21 at 11:00 AM EDT by a video enabled telemedicine application and verified that I am speaking with the correct person using two identifiers.  The patient and the provider were at separate locations throughout the entire encounter. Patient location: home, Provider location: work   I discussed the limitations of evaluation and management by telemedicine and the availability of in person appointments. The patient expressed understanding and agreed to proceed. The patient and the provider were the only parties present for the visit unless noted in HPI below.  History of Present Illness: The patient is a 70 y.o. female with visit for covid-19 positive last night. Sunday family reunion. Some nose congestion, cough, chills which are all intermittent.  Observations/Objective: Appearance: normal, breathing appears normal no coughing during visit, casual grooming, abdomen does not appear distended, mental status is A and O times 3  Assessment and Plan: See problem oriented charting  Follow Up Instructions: rx [email or web address]  I discussed the assessment and treatment plan with the patient. The patient was provided an opportunity to ask questions and all were answered. The patient agreed with the plan and demonstrated an understanding of the instructions.   The patient was advised to call back or seek an in-person evaluation if the symptoms worsen or if the condition fails to improve as anticipated.  Hoyt Koch, MD

## 2021-02-13 ENCOUNTER — Other Ambulatory Visit: Payer: Self-pay

## 2021-02-13 ENCOUNTER — Inpatient Hospital Stay (HOSPITAL_COMMUNITY)
Admission: EM | Admit: 2021-02-13 | Discharge: 2021-02-16 | DRG: 065 | Disposition: A | Discharge status: Home Health | Payer: Medicare Other | Attending: Neurology | Admitting: Neurology

## 2021-02-13 ENCOUNTER — Emergency Department (HOSPITAL_COMMUNITY): Payer: Medicare Other

## 2021-02-13 ENCOUNTER — Encounter (HOSPITAL_COMMUNITY): Payer: Self-pay

## 2021-02-13 DIAGNOSIS — I6523 Occlusion and stenosis of bilateral carotid arteries: Secondary | ICD-10-CM | POA: Diagnosis not present

## 2021-02-13 DIAGNOSIS — Z79899 Other long term (current) drug therapy: Secondary | ICD-10-CM

## 2021-02-13 DIAGNOSIS — Z823 Family history of stroke: Secondary | ICD-10-CM | POA: Diagnosis not present

## 2021-02-13 DIAGNOSIS — G9389 Other specified disorders of brain: Secondary | ICD-10-CM | POA: Diagnosis not present

## 2021-02-13 DIAGNOSIS — E785 Hyperlipidemia, unspecified: Secondary | ICD-10-CM | POA: Diagnosis present

## 2021-02-13 DIAGNOSIS — I639 Cerebral infarction, unspecified: Secondary | ICD-10-CM | POA: Diagnosis not present

## 2021-02-13 DIAGNOSIS — R29810 Facial weakness: Secondary | ICD-10-CM | POA: Diagnosis present

## 2021-02-13 DIAGNOSIS — K219 Gastro-esophageal reflux disease without esophagitis: Secondary | ICD-10-CM | POA: Diagnosis present

## 2021-02-13 DIAGNOSIS — M852 Hyperostosis of skull: Secondary | ICD-10-CM | POA: Diagnosis not present

## 2021-02-13 DIAGNOSIS — I088 Other rheumatic multiple valve diseases: Secondary | ICD-10-CM | POA: Diagnosis not present

## 2021-02-13 DIAGNOSIS — I619 Nontraumatic intracerebral hemorrhage, unspecified: Secondary | ICD-10-CM | POA: Diagnosis present

## 2021-02-13 DIAGNOSIS — I10 Essential (primary) hypertension: Secondary | ICD-10-CM | POA: Diagnosis present

## 2021-02-13 DIAGNOSIS — G936 Cerebral edema: Secondary | ICD-10-CM | POA: Diagnosis not present

## 2021-02-13 DIAGNOSIS — Z20822 Contact with and (suspected) exposure to covid-19: Secondary | ICD-10-CM | POA: Diagnosis present

## 2021-02-13 DIAGNOSIS — I6389 Other cerebral infarction: Secondary | ICD-10-CM | POA: Diagnosis not present

## 2021-02-13 DIAGNOSIS — I6930 Unspecified sequelae of cerebral infarction: Secondary | ICD-10-CM

## 2021-02-13 DIAGNOSIS — E7800 Pure hypercholesterolemia, unspecified: Secondary | ICD-10-CM | POA: Diagnosis present

## 2021-02-13 DIAGNOSIS — Z7982 Long term (current) use of aspirin: Secondary | ICD-10-CM | POA: Diagnosis not present

## 2021-02-13 DIAGNOSIS — Z833 Family history of diabetes mellitus: Secondary | ICD-10-CM | POA: Diagnosis not present

## 2021-02-13 DIAGNOSIS — I610 Nontraumatic intracerebral hemorrhage in hemisphere, subcortical: Principal | ICD-10-CM | POA: Diagnosis present

## 2021-02-13 DIAGNOSIS — Z9889 Other specified postprocedural states: Secondary | ICD-10-CM | POA: Diagnosis not present

## 2021-02-13 DIAGNOSIS — G8191 Hemiplegia, unspecified affecting right dominant side: Secondary | ICD-10-CM | POA: Diagnosis present

## 2021-02-13 DIAGNOSIS — Z8249 Family history of ischemic heart disease and other diseases of the circulatory system: Secondary | ICD-10-CM

## 2021-02-13 DIAGNOSIS — I672 Cerebral atherosclerosis: Secondary | ICD-10-CM | POA: Diagnosis not present

## 2021-02-13 HISTORY — DX: Unspecified sequelae of cerebral infarction: I69.30

## 2021-02-13 LAB — DIFFERENTIAL
Abs Immature Granulocytes: 0.02 10*3/uL (ref 0.00–0.07)
Basophils Absolute: 0 10*3/uL (ref 0.0–0.1)
Basophils Relative: 0 %
Eosinophils Absolute: 0.3 10*3/uL (ref 0.0–0.5)
Eosinophils Relative: 3 %
Immature Granulocytes: 0 %
Lymphocytes Relative: 24 %
Lymphs Abs: 2.3 10*3/uL (ref 0.7–4.0)
Monocytes Absolute: 0.8 10*3/uL (ref 0.1–1.0)
Monocytes Relative: 8 %
Neutro Abs: 6.1 10*3/uL (ref 1.7–7.7)
Neutrophils Relative %: 65 %

## 2021-02-13 LAB — RAPID URINE DRUG SCREEN, HOSP PERFORMED
Amphetamines: NOT DETECTED
Barbiturates: NOT DETECTED
Benzodiazepines: NOT DETECTED
Cocaine: NOT DETECTED
Opiates: NOT DETECTED
Tetrahydrocannabinol: NOT DETECTED

## 2021-02-13 LAB — COMPREHENSIVE METABOLIC PANEL
ALT: 19 U/L (ref 0–44)
AST: 30 U/L (ref 15–41)
Albumin: 4 g/dL (ref 3.5–5.0)
Alkaline Phosphatase: 58 U/L (ref 38–126)
Anion gap: 7 (ref 5–15)
BUN: 17 mg/dL (ref 8–23)
CO2: 28 mmol/L (ref 22–32)
Calcium: 9.9 mg/dL (ref 8.9–10.3)
Chloride: 104 mmol/L (ref 98–111)
Creatinine, Ser: 0.73 mg/dL (ref 0.44–1.00)
GFR, Estimated: >60 mL/min (ref >60)
Glucose, Bld: 93 mg/dL (ref 70–99)
Potassium: 3.7 mmol/L (ref 3.5–5.1)
Sodium: 139 mmol/L (ref 135–145)
Total Bilirubin: 0.4 mg/dL (ref 0.3–1.2)
Total Protein: 7.4 g/dL (ref 6.5–8.1)

## 2021-02-13 LAB — CBC
HCT: 40.8 % (ref 36.0–46.0)
Hemoglobin: 12.7 g/dL (ref 12.0–15.0)
MCH: 28.3 pg (ref 26.0–34.0)
MCHC: 31.1 g/dL (ref 30.0–36.0)
MCV: 90.9 fL (ref 80.0–100.0)
Platelets: 212 10*3/uL (ref 150–400)
RBC: 4.49 MIL/uL (ref 3.87–5.11)
RDW: 14.6 % (ref 11.5–15.5)
WBC: 9.5 10*3/uL (ref 4.0–10.5)
nRBC: 0 % (ref 0.0–0.2)

## 2021-02-13 LAB — URINALYSIS, ROUTINE W REFLEX MICROSCOPIC
Bilirubin Urine: NEGATIVE
Glucose, UA: NEGATIVE mg/dL
Hgb urine dipstick: NEGATIVE
Ketones, ur: NEGATIVE mg/dL
Leukocytes,Ua: NEGATIVE
Nitrite: NEGATIVE
Protein, ur: NEGATIVE mg/dL
Specific Gravity, Urine: 1.006 (ref 1.005–1.030)
pH: 7 (ref 5.0–8.0)

## 2021-02-13 LAB — PROTIME-INR
INR: 1 (ref 0.8–1.2)
Prothrombin Time: 13.6 seconds (ref 11.4–15.2)

## 2021-02-13 LAB — APTT: aPTT: 30 seconds (ref 24–36)

## 2021-02-13 LAB — RESP PANEL BY RT-PCR (FLU A&B, COVID) ARPGX2
Influenza A by PCR: NEGATIVE
Influenza B by PCR: NEGATIVE
SARS Coronavirus 2 by RT PCR: NEGATIVE

## 2021-02-13 MED ORDER — ACETAMINOPHEN 325 MG PO TABS
650.0000 mg | ORAL_TABLET | ORAL | Status: DC | PRN
  Administered 2021-02-13: 650 mg via ORAL
  Filled 2021-02-13: qty 2

## 2021-02-13 MED ORDER — ACETAMINOPHEN 160 MG/5ML PO SOLN: 650.0000 mg | ORAL | Status: DC | PRN

## 2021-02-13 MED ORDER — ACETAMINOPHEN 650 MG RE SUPP: 650.0000 mg | RECTAL | Status: DC | PRN

## 2021-02-13 MED ORDER — STROKE: EARLY STAGES OF RECOVERY BOOK
Freq: Once | Status: AC
  Filled 2021-02-13: qty 1

## 2021-02-13 MED ORDER — SENNOSIDES-DOCUSATE SODIUM 8.6-50 MG PO TABS
1.0000 | ORAL_TABLET | Freq: Two times a day (BID) | ORAL | Status: DC
  Administered 2021-02-15: 1 via ORAL
  Filled 2021-02-13 (×5): qty 1

## 2021-02-13 NOTE — ED Triage Notes (Signed)
Pt. Arrived POV stating that about 2 days ago she started experiencing a funny feeling when getting out of the shower, loss of balance, headache and weakness to the right side. Pt. Has a history of stroke. She states that she called her primary doctor and he said that she needed to get checked out in the ED.

## 2021-02-13 NOTE — H&P (Addendum)
Neurology H&P  CC: Unsteadiness  History is obtained from: Patient, husband  HPI: [name] is a 70 y.o. female with a history of previous stroke with mild left-sided weakness who presents with unsteadiness that started yesterday.  She states that she had a subconjunctival hemorrhage 2 days ago, and then yesterday she began feeling off balance.  Due to persistent symptoms, she was concerned that she had a stroke and sought care in the emergency department at [facility] today.  There a CT scan was performed which demonstrates   LKW: 11/4 tpa given?: No, [facility] IR Thrombectomy? No, [facility] Modified Rankin Scale: 1-No significant post stroke disability and can perform usual duties with stroke symptoms    ROS: A complete ROS was performed and is negative except as noted in the HPI.   Past Medical History:  Diagnosis Date   Arthritis    back, fingers with joint pain and swelling.  chronic back pain   Cataract    Chronic back pain    arthritis    Diverticulosis    Elevated cholesterol    takes Niacin daily and Simvastatin   GERD (gastroesophageal reflux disease) 06/2004   non-specific gastritis on EGD 06/2004   Headache(784.0)    occasionally   History of colon polyps 2004, 2009   2004:adenomatous. 2009 hyperplastic.    History of hiatal hernia    Hypertension    takes Tenoretic and Lisinopril daily   Mucoid cyst of joint 08/2013   right index finger   Osteopenia 01/2014   T score -1.1 FRAX 14%/0.5%. Stable from prior DEXA   PONV (postoperative nausea and vomiting)    Rotator cuff arthropathy    Left   Seasonal allergies    Stroke ([place]) 1998   x 2 - mild left-sided weakness   Urge incontinence    Uterine prolapse      Family History  Problem Relation Age of Onset   Hypertension Mother    Heart disease Mother    Diabetes Father    Hypertension Father    Heart disease Father    Stroke Father    Diabetes Maternal Aunt    Diabetes Paternal Grandmother    Hypertension  Paternal Grandfather    Stroke Paternal Grandfather    Colon cancer Neg Hx    Esophageal cancer Neg Hx    Rectal cancer Neg Hx    Stomach cancer Neg Hx      Social History:  reports that she is a non-smoker but has been exposed to tobacco smoke. She has never used smokeless tobacco. She reports that she does not currently use alcohol. She reports that she does not use drugs.   Prior to Admission medications   Medication Sig Start Date End Date Taking? Authorizing Provider  amoxicillin (AMOXIL) 500 MG tablet Take 2 grams (4 tablets) one hour prior to procedure. Patient taking differently: Take 2,000 mg by mouth See admin instructions. Take 2 grams (4 tablets) by mouth one hour prior to dental procedures 04/18/19  Yes Magnant, Gerrianne Scale, PA-C  Ascorbic Acid (VITAMIN C) 1000 MG tablet Take 1,000 mg by mouth 2 (two) times daily.   Yes [provider]  aspirin 81 MG EC tablet Take 81 mg by mouth 2 (two) times daily.    Yes [provider]  atenolol-chlorthalidone (TENORETIC) 50-25 MG tablet Take 1 tablet by mouth daily. Patient taking differently: Take 1 tablet by mouth every morning. 02/24/20  Yes Hoyt Koch, MD  b complex vitamins  tablet Take 1 tablet by mouth at bedtime.   Yes [provider]  Calcium Carbonate-Vitamin D (CALCIUM + D PO) Take 1 tablet by mouth at bedtime.   Yes [provider]  CHELATED MAGNESIUM PO Take 3 tablets by mouth every morning.   Yes [provider]  Cholecalciferol (D 5000) 5000 units capsule Take 5,000 Units by mouth 3 (three) times daily.   Yes [provider]  Coenzyme Q10 (COQ10) 100 MG CAPS Take 100 mg by mouth every evening.    Yes [provider]  diphenoxylate-atropine (LOMOTIL) 2.5-0.025 MG tablet TAKE 1 TABLET BY MOUTH FOUR TIMES A DAY AS NEEDED FOR DIARRHEA OR LOOSE STOOLS Patient taking differently: Take 1 tablet by mouth 4 (four) times daily as needed for diarrhea or loose stools.  05/29/20  Yes Ladene Artist, MD  Ferrous Sulfate (IRON) 325 (65 Fe) MG TABS Take 1 tablet (325 mg total) by mouth 2 (two) times daily. Patient taking differently: Take 325 mg by mouth every morning. 07/01/20  Yes Ladene Artist, MD  Fiber POWD Take 5 mLs by mouth 2 (two) times daily. Heather's Tummy - mix in water and drink   Yes [provider]  folic acid (FOLVITE) 809 MCG tablet Take 800 mcg by mouth every evening.    Yes [provider]  hyoscyamine (LEVSIN SL) 0.125 MG SL tablet Take 1-2 tablets by mouth every 4 hours as needed Patient taking differently: Take 0.125 mg by mouth 2 (two) times daily as needed (stomach cramps). 12/26/19  Yes Ladene Artist, MD  ketotifen (ZADITOR) 0.025 % ophthalmic solution Place 2 drops into both eyes 2 (two) times daily as needed (allergies).   Yes [provider]  lisinopril (ZESTRIL) 2.5 MG tablet TAKE 1 TABLET DAILY Patient taking differently: Take 2.5 mg by mouth at bedtime. 12/12/20  Yes Hoyt Koch, MD  Melatonin 3 MG TABS Take 3 mg by mouth at bedtime.    Yes [provider]  methocarbamol (ROBAXIN) 500 MG tablet TAKE 1 TABLET BY MOUTH EVERY 8 HOURS AS NEEDED FOR MUSCLE SPASMS Patient taking differently: Take 500 mg by mouth every 8 (eight) hours as needed for muscle spasms. 02/24/20  Yes Hoyt Koch, MD  Multiple Vitamin (MULTIVITAMIN WITH MINERALS) TABS tablet Take 1 tablet by mouth at bedtime.   Yes [provider]  OVER THE COUNTER MEDICATION Take 5 drops by mouth at bedtime. Lemon Balm   Yes [provider]  OVER THE COUNTER MEDICATION Take 1 Dose by mouth at bedtime. Cats Claw liquid daily   Yes [provider]  OVER THE COUNTER MEDICATION Apply 1 application topically at bedtime. Magnesium Lotion   Yes [provider]  polyvinyl alcohol (LIQUIFILM TEARS) 1.4 % ophthalmic solution Place 1 drop into both eyes daily as needed for dry eyes.   Yes [provider]  potassium chloride SA (KLOR-CON M20) 20 MEQ tablet Take 1 tablet (20 mEq total) by mouth 2 (two) times daily. 02/24/20  Yes Hoyt Koch, MD  Probiotic Product (PROBIOTIC DAILY PO) Take 1 capsule by mouth 2 (two) times daily.   Yes [provider]  simvastatin (ZOCOR) 20 MG tablet Take 1 tablet (20 mg total) by mouth daily. Patient taking differently: Take 20 mg by mouth at bedtime. 02/24/20  Yes Hoyt Koch, MD  Sodium Fluoride (SODIUM FLUORIDE 5000 PPM) 1.1 % PSTE Place 1 application onto teeth at bedtime.   Yes [provider]  Zinc Sulfate (ZINC 15 PO) Take 10 drops by mouth at bedtime. Liquid Blend 10 drops equal 15 mg   Yes [provider]  amoxicillin-clavulanate (AUGMENTIN) 875-125 MG tablet Take 1 tablet by mouth 2 (two) times daily. Patient not taking: No sig reported 01/13/21   Biagio Borg, MD     Exam: Current vital signs: BP 111/62   Pulse 64   Temp (!) 97.5 F (36.4 C) (Axillary)   Resp 17   Ht 5' 2"  (1.575 m)   Wt 72.6 kg   LMP 02/09/2005   SpO2 97%   BMI 29.26 kg/m    Physical Exam  Constitutional: Appears well-developed and well-nourished.  Psych: Affect appropriate to situation Eyes: No scleral injection HENT: No OP obstrucion Head: Normocephalic.  Cardiovascular: Normal rate and regular rhythm.  Respiratory: Effort normal and breath sounds normal to anterior ascultation GI: Soft.  No distension. There is no tenderness.  Skin: WDI  Neuro: Mental Status: Patient is awake, alert, oriented to person, place, month, year, and situation. Patient is able to give a clear and coherent history. No signs of aphasia or neglect Cranial Nerves: II: Visual Fields are full. Pupils are equal, round, and reactive to light.   III,IV, VI: EOMI without ptosis or diplopia.  V: Facial sensation is symmetric to temperature VII: Facial movement with mild right facial weakness VIII: hearing is intact to voice X:  Uvula is midline and palate elevates symmetrically XI: Shoulder shrug is symmetric. XII: tongue is midline without atrophy or fasciculations.  Motor: Tone is normal. Bulk is normal. 5/5 strength was present on the left.  She has good strength on the right to confrontation, but has a very mild pronator drift in the right arm and the right leg she has 4/5 weakness. Sensory: Sensation is symmetric to light touch and temperature in the arms and legs. Cerebellar: FNF mildly slower on the left than right.  I have reviewed labs in epic and the pertinent results are: CMP-unremarkable CBC-normal  I have reviewed the images obtained: CT head subcortical hemorrhage on the left  Primary Diagnosis:  Nontraumatic intracerebral hemorrhage in hemisphere, subcortical  Secondary Diagnosis: Essential (primary) hypertension   Impression: 70 year old female with history of previous stroke who presents with subcortical hemorrhage on the left.  Location is certainly suggestive of a hypertensive bleed, but she has not been particularly hypertensive.  I will get an MRI to assess for other possible etiologies, but if negative then I would treat this as hypertensive bleed.  Plan: 1) Admit to 3w 2) no antiplatelets or anticoagulants 3) blood pressure control with goal systolic [phone] 4) Frequent neuro checks 5) If symptoms worsen or there is decreased mental status, repeat stat head CT 6) resume home antihypertensives  7) labetalol as needed high blood pressure  8) PT,OT,ST   Roland Rack, MD [facility] [phone]  If 7pm- 7am, please page neurology on call as listed in [place].

## 2021-02-13 NOTE — ED Provider Notes (Signed)
[place] [facility] DEPT Provider Note   CSN: [id number] Arrival date & time: 02/13/21  1351     History Chief Complaint  Patient presents with   Weakness    [name] is a 70 y.o. female.  HPI    70 year old female comes in with chief complaint of unsteady gait, right-sided weakness.  She has history of stroke several years back with slight residual left-sided weakness, high cholesterol, hypertension.  Patient reports that 2 days ago she started feeling little unsteady.  Yesterday her unsteadiness was more severe and she also had some right-sided weakness and headache.  She noted that it was difficult for her to do simple tasks like opening bottles and writing.  She also was unable to successfully complete simple tasks like turning on the ignition key.  When she walks, she feels unsteady, but does not require a walker.  No neck pain.   Past Medical History:  Diagnosis Date   Arthritis    back, fingers with joint pain and swelling.  chronic back pain   Cataract    Chronic back pain    arthritis    Diverticulosis    Elevated cholesterol    takes Niacin daily and Simvastatin   GERD (gastroesophageal reflux disease) 06/2004   non-specific gastritis on EGD 06/2004   Headache(784.0)    occasionally   History of colon polyps 2004, 2009   2004:adenomatous. 2009 hyperplastic.    History of hiatal hernia    Hypertension    takes Tenoretic and Lisinopril daily   Mucoid cyst of joint 08/2013   right index finger   Osteopenia 01/2014   T score -1.1 FRAX 14%/0.5%. Stable from prior DEXA   PONV (postoperative nausea and vomiting)    Rotator cuff arthropathy    Left   Seasonal allergies    Stroke ([place]) 1998   x 2 - mild left-sided weakness   Urge incontinence    Uterine prolapse     Patient Active Problem List   Diagnosis Date Noted   ICH (intracerebral hemorrhage) ([place]) 02/13/2021   Stroke ([place]) 02/13/2021   COVID-19 01/22/2021   Dog bite of  middle finger 01/17/2021   Atypical chest pain 10/07/2020   Iron deficiency 10/07/2020   Aortic atherosclerosis ([place]) 03/17/2020   Lung nodule seen on imaging study 02/24/2020   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   Chronic left shoulder pain 08/23/2018   Chronic diarrhea 12/07/2014   Segmental colitis with rectal bleeding ([place]) 09/12/2014   Routine general medical examination at a health care facility 06/05/2014   Overweight 06/13/2011   Hyperlipidemia 02/17/2007   Essential hypertension 01/08/2007   Osteoarthritis 01/08/2007    Past Surgical History:  Procedure Laterality Date   BACK SURGERY     x 2   BELPHAROPTOSIS REPAIR Bilateral 12/14/2017   BUBBLE STUDY  02/16/2021   Procedure: BUBBLE STUDY;  Surgeon: Thayer Headings, MD;  Location: [place];  Service: Cardiovascular;;   CATARACT EXTRACTION     CATARACT EXTRACTION Bilateral 10/2017   COLONOSCOPY  2004, 2009, 2014   [facility]     HYSTEROSCOPY WITH D & C  12/07/2010   with resection of endometrial polyp   JOINT REPLACEMENT     KNEE ARTHROSCOPY Left 2008   lip biopsy     done at MD office Fri 11/22/13   MASS EXCISION Right 08/15/2013   Procedure: RIGHT INDEX EXCISION MASS ;  Surgeon: Tennis Must, MD;  Location: [place];  Service: Orthopedics;  Laterality: Right;   NASAL SEPTUM SURGERY     OOPHORECTOMY Right 2000   REVERSE SHOULDER ARTHROPLASTY Left 12/11/2018   REVERSE SHOULDER ARTHROPLASTY Left 12/11/2018   Procedure: LEFT REVERSE SHOULDER ARTHROPLASTY;  Surgeon: Meredith Pel, MD;  Location: [place];  Service: Orthopedics;  Laterality: Left;   SHOULDER ARTHROSCOPY WITH OPEN ROTATOR CUFF REPAIR AND DISTAL CLAVICLE ACROMINECTOMY Right 11/26/2013   Procedure: RIGHT SHOULDER ARTHROSCOPY WITH MINI OPEN ROTATOR CUFF REPAIR AND DISTAL CLAVICLE RESECTION, SUBACROMIAL DECOMPRESSION, POSSIBLE [facility] PATCH.;  Surgeon: [name], MD;   Location: [place];  Service: Orthopedics;  Laterality: Right;   TEE WITHOUT CARDIOVERSION N/A 02/16/2021   Procedure: TRANSESOPHAGEAL ECHOCARDIOGRAM (TEE);  Surgeon: Acie Fredrickson Wonda Cheng, MD;  Location[facility];  Service: Cardiovascular;  Laterality: N/A;   TOTAL KNEE ARTHROPLASTY Right 03/21/2017   TOTAL KNEE ARTHROPLASTY Right 03/21/2017   Procedure: RIGHT TOTAL KNEE ARTHROPLASTY;  Surgeon: [name], MD;  Location: [place];  Service: Orthopedics;  Laterality: Right;   TUBAL LIGATION       OB History     Gravida  2   Para  2   Term  2   Preterm      AB      Living  2      SAB      IAB      Ectopic      Multiple      Live Births              Family History  Problem Relation Age of Onset   Hypertension Mother    Heart disease Mother    Diabetes Father    Hypertension Father    Heart disease Father    Stroke Father    Diabetes Maternal Aunt    Diabetes Paternal Grandmother    Hypertension Paternal Grandfather    Stroke Paternal Grandfather    Colon cancer Neg Hx    Esophageal cancer Neg Hx    Rectal cancer Neg Hx    Stomach cancer Neg Hx     Social History   Tobacco Use   Smoking status: Passive Smoke Exposure - Never Smoker   Smokeless tobacco: Never  Vaping Use   Vaping Use: Never used  Substance Use Topics   Alcohol use: Not Currently    Alcohol/week: 0.0 standard drinks    Comment: about 2-3 times a year   Drug use: No    Home Medications Prior to Admission medications   Medication Sig Start Date End Date Taking? Authorizing Provider  Ascorbic Acid (VITAMIN C) 1000 MG tablet Take 1,000 mg by mouth 2 (two) times daily.   Yes [provider]  atenolol-chlorthalidone (TENORETIC) 50-25 MG tablet Take 1 tablet by mouth daily. Patient taking differently: Take 1 tablet by mouth every morning. 02/24/20  Yes Hoyt Koch, MD  b complex vitamins tablet Take 1 tablet by mouth at bedtime.   Yes [provider]   Calcium Carbonate-Vitamin D (CALCIUM + D PO) Take 1 tablet by mouth at bedtime.   Yes [provider]  CHELATED MAGNESIUM PO Take 3 tablets by mouth every morning.   Yes [provider]  Cholecalciferol (D 5000) 5000 units capsule Take 5,000 Units by mouth 3 (three) times daily.   Yes [provider]  Coenzyme Q10 (COQ10) 100 MG CAPS Take 100 mg by mouth every evening.    Yes  [provider]  diphenoxylate-atropine (LOMOTIL) 2.5-0.025 MG tablet TAKE 1 TABLET BY MOUTH FOUR TIMES A DAY AS NEEDED FOR DIARRHEA OR LOOSE STOOLS Patient taking differently: Take 1 tablet by mouth 4 (four) times daily as needed for diarrhea or loose stools. 05/29/20  Yes Ladene Artist, MD  Ferrous Sulfate (IRON) 325 (65 Fe) MG TABS Take 1 tablet (325 mg total) by mouth 2 (two) times daily. Patient taking differently: Take 325 mg by mouth every morning. 07/01/20  Yes Ladene Artist, MD  Fiber POWD Take 5 mLs by mouth 2 (two) times daily. Heather's Tummy - mix in water and drink   Yes [provider]  folic acid (FOLVITE) 944 MCG tablet Take 800 mcg by mouth every evening.    Yes [provider]  hyoscyamine (LEVSIN SL) 0.125 MG SL tablet Take 1-2 tablets by mouth every 4 hours as needed Patient taking differently: Take 0.125 mg by mouth 2 (two) times daily as needed (stomach cramps). 12/26/19  Yes Ladene Artist, MD  ketotifen (ZADITOR) 0.025 % ophthalmic solution Place 2 drops into both eyes 2 (two) times daily as needed (allergies).   Yes [provider]  lisinopril (ZESTRIL) 2.5 MG tablet TAKE 1 TABLET DAILY Patient taking differently: Take 2.5 mg by mouth at bedtime. 12/12/20  Yes Hoyt Koch, MD  Melatonin 3 MG TABS Take 3 mg by mouth at bedtime.    Yes [provider]  methocarbamol (ROBAXIN) 500 MG tablet TAKE 1 TABLET BY MOUTH EVERY 8 HOURS AS NEEDED FOR MUSCLE SPASMS Patient taking differently: Take 500 mg by mouth every 8 (eight)  hours as needed for muscle spasms. 02/24/20  Yes Hoyt Koch, MD  Multiple Vitamin (MULTIVITAMIN WITH MINERALS) TABS tablet Take 1 tablet by mouth at bedtime.   Yes [provider]  OVER THE COUNTER MEDICATION Take 5 drops by mouth at bedtime. Lemon Balm   Yes [provider]  OVER THE COUNTER MEDICATION Take 1 Dose by mouth at bedtime. Cats Claw liquid daily   Yes [provider]  OVER THE COUNTER MEDICATION Apply 1 application topically at bedtime. Magnesium Lotion   Yes [provider]  polyvinyl alcohol (LIQUIFILM TEARS) 1.4 % ophthalmic solution Place 1 drop into both eyes daily as needed for dry eyes.   Yes [provider]  potassium chloride SA (KLOR-CON M20) 20 MEQ tablet Take 1 tablet (20 mEq total) by mouth 2 (two) times daily. 02/24/20  Yes Hoyt Koch, MD  Probiotic Product (PROBIOTIC DAILY PO) Take 1 capsule by mouth 2 (two) times daily.   Yes [provider]  simvastatin (ZOCOR) 20 MG tablet Take 1 tablet (20 mg total) by mouth daily. Patient taking differently: Take 20 mg by mouth at bedtime. 02/24/20  Yes Hoyt Koch, MD  Sodium Fluoride (SODIUM FLUORIDE 5000 PPM) 1.1 % PSTE Place 1 application onto teeth at bedtime.   Yes [provider]  Zinc Sulfate (ZINC 15 PO) Take 10 drops by mouth at bedtime. Liquid Blend 10 drops equal 15 mg   Yes [provider]  aspirin EC 81 MG EC tablet Take 1 tablet (81 mg total) by mouth daily. Swallow whole. 02/17/21   Dennison Mascot, PA-C    Allergies    Eggs or egg-derived products, Etodolac, Fish-derived products, Omeprazole, Pineapple, and Gluten meal  Review of Systems   Review of Systems  Constitutional:  Positive for activity change.  Eyes:  Positive for visual disturbance.  Gastrointestinal:  Negative for nausea and vomiting.  Neurological:  Positive for weakness and headaches. Negative for speech difficulty.  All other systems  reviewed and are negative.  Physical Exam Updated Vital Signs BP (!) 123/98 (BP Location: Right Arm)   Pulse 90   Temp (!) 97.3 F (36.3 C) (Oral)   Resp 20   Ht 5' 2"  (1.575 m)   Wt 72.6 kg   LMP 02/09/2005   SpO2 100%   BMI 29.27 kg/m   Physical Exam Vitals and nursing note reviewed.  Constitutional:      Appearance: She is well-developed.  HENT:     Head: Atraumatic.  Eyes:     Extraocular Movements: Extraocular movements intact.     Pupils: Pupils are equal, round, and reactive to light.     Comments: No nystagmus, peripheral visual fields are intact  Cardiovascular:     Rate and Rhythm: Normal rate.  Pulmonary:     Effort: Pulmonary effort is normal.  Musculoskeletal:     Cervical back: Normal range of motion and neck supple.  Skin:    General: Skin is warm and dry.  Neurological:     Mental Status: She is alert and oriented to person, place, and time.     Sensory: No sensory deficit.     Motor: Weakness present.     Comments: Subtle right-sided facial droop and nasal labial flattening, gross sensory exam for face, upper and lower extremities normal, upper and lower extremity strength is 4+ out of 5 bilaterally, no ataxia appreciated, no dysmetria    ED Results / Procedures / Treatments   Labs (all labs ordered are listed, but only abnormal results are displayed) Labs Reviewed  LIPID PANEL - Abnormal; Notable for the following components:      Result Value   HDL 37 (*)    LDL Cholesterol 122 (*)    All other components within normal limits  RESP PANEL BY RT-PCR (FLU A&B, COVID) ARPGX2  SURGICAL PCR SCREEN  PROTIME-INR  APTT  CBC  DIFFERENTIAL  COMPREHENSIVE METABOLIC PANEL  RAPID URINE DRUG SCREEN, HOSP PERFORMED  URINALYSIS, ROUTINE W REFLEX MICROSCOPIC  HIV ANTIBODY (ROUTINE TESTING W REFLEX)  HEMOGLOBIN A1C    EKG EKG Interpretation  Date/Time:  Monday February 15 2021 08:52:08 EST Ventricular Rate:  74 PR Interval:  122 QRS  Duration: 78 QT Interval:  402 QTC Calculation: 446 R Axis:   55 Text Interpretation: Sinus rhythm with Premature atrial complexes Confirmed by Randal Buba, April ([id number]) on 02/16/2021 8:05:45 AM  Radiology No results found.  Procedures .Critical Care Performed by: Varney Biles, MD Authorized by: Varney Biles, MD   Critical care provider statement:    Critical care time (minutes):  38   Critical care time was exclusive of:  Separately billable procedures and treating other patients   Critical care was necessary to treat or prevent imminent or life-threatening deterioration of the following conditions:  CNS failure or compromise   Medications Ordered in ED Medications   stroke: mapping our early stages of recovery book ( Does not apply Given 02/13/21 2255)    ED Course  I have reviewed the triage vital signs and the nursing notes.  Pertinent labs & imaging results that were available during my care of the patient were reviewed by me and considered in my medical decision making (see chart for details).  Clinical Course as of 02/22/21 1339  Sat Feb 13, 2021  1639 CT HEAD WO CONTRAST CT head was reviewed  independently by me while patient was getting CAT scan.  Clearly there was an evidence of bleed.  Radiologist called her after confirming basal ganglia on bleed.  Discussed the findings with the patient.  She is hemodynamically stable, not on any blood thinners.  Neurology has been consulted for formal admission. [AN]    Clinical Course User Index [AN] Varney Biles, MD   MDM Rules/Calculators/A&P                           70 year old female comes in with chief complaint of ataxic gait, right-sided weakness, some difficulty completing simple tasks, and visual disturbance.  On my exam, he had no focal deficits besides right-sided nasolabial flattening.  Cerebellar exam, ocular exam are reassuring.   Clinical suspicion for ischemic stroke.  Outside of any stroke treatment  window.  Final Clinical Impression(s) / ED Diagnoses Final diagnoses:  Nontraumatic intracerebral hemorrhage, unspecified cerebral location, unspecified laterality [facility])    Rx / DC Orders ED Discharge Orders          Ordered    aspirin EC 81 MG EC tablet  Daily        02/16/21 1731    Ambulatory referral to Physical Therapy        02/15/21 1519    Ambulatory referral to Occupational Therapy        02/15/21 [place], [place], MD 02/22/21 1340

## 2021-02-13 NOTE — Plan of Care (Signed)
[place] Accepted to Neurohospitalist/Stroke svc on 3w progressive (no BP drips needs, small bleed)  -- Amie Portland, MD Neurologist [facility] Pager: [phone]

## 2021-02-14 ENCOUNTER — Inpatient Hospital Stay (HOSPITAL_COMMUNITY): Payer: Medicare Other

## 2021-02-14 DIAGNOSIS — I610 Nontraumatic intracerebral hemorrhage in hemisphere, subcortical: Secondary | ICD-10-CM | POA: Diagnosis not present

## 2021-02-14 DIAGNOSIS — I6389 Other cerebral infarction: Secondary | ICD-10-CM

## 2021-02-14 LAB — ECHOCARDIOGRAM COMPLETE
AR max vel: 1.51 cm2
AV Area VTI: 1.41 cm2
AV Area mean vel: 1.62 cm2
AV Mean grad: 4 mmHg
AV Peak grad: 8.6 mmHg
Ao pk vel: 1.47 m/s
Area-P 1/2: 3.77 cm2
Height: 62 in
S' Lateral: 2.2 cm
Weight: 2560 oz

## 2021-02-14 LAB — HIV ANTIBODY (ROUTINE TESTING W REFLEX): HIV Screen 4th Generation wRfx: NONREACTIVE

## 2021-02-14 MED ORDER — ATENOLOL 25 MG PO TABS
50.0000 mg | ORAL_TABLET | Freq: Every day | ORAL | Status: DC
  Filled 2021-02-14: qty 2

## 2021-02-14 MED ORDER — CHLORTHALIDONE 25 MG PO TABS
25.0000 mg | ORAL_TABLET | Freq: Every day | ORAL | Status: DC
  Filled 2021-02-14: qty 1

## 2021-02-14 MED ORDER — ATENOLOL-CHLORTHALIDONE 50-25 MG PO TABS: 1.0000 | ORAL_TABLET | Freq: Every day | ORAL | Status: DC

## 2021-02-14 MED ORDER — LISINOPRIL 2.5 MG PO TABS
2.5000 mg | ORAL_TABLET | Freq: Every day | ORAL | Status: DC
  Filled 2021-02-14: qty 1

## 2021-02-14 NOTE — Evaluation (Addendum)
Occupational Therapy Evaluation Patient Details Name: [name] MRN: [id number] DOB: 07/26/1950 Today's Date: 02/14/2021   History of Present Illness pt is 70 y.o. female with a history of previous stroke with mild left-sided weakness who presents with unsteadiness; CT completed intraparenchymal hemorrhage in the left basal  ganglia along the external capsule with mild adjacent edema but without significant mass effect, and Remote lacunar type infarct in the left cerebellar hemisphere; during hospitalization pt complining of numbness/tingling to R LE, MRI then completed resulting in Biconvex hemorrhage at the left lentiform nucleus with estimated  blood volume of 2 mL. Surrounding edema but no significant mass  effect or other complicating features, Chronic hemorrhage in encephalomalacia at the right occipital lobe. But no other chronic cerebral blood products. However, chronic  infarcts also at the right superior temporal gyrus, left cerebellum  SCA territory Most recent NIH scoring of 5 per RN assessment.   Clinical Impression   Pt pleasant and agreeable to session. Pt endorsing and presenting with no focal acute deficits during Eval, noted in chart reporting numbness/tingling to R LE, but she also endorses that sensation to R LE is different from L since R TKA, mild proprioceptive and strength deficits to R UE, but pt's biggest complaint is feeling slight off balance on her recent trips to bathroom with staff. BP stable throughout session which was completed seated and breifly standing at EOB for repositioning, pt requiring min guard for ADLs and brief bouts of functional transfers and standing this date. OT will follow for 1-2 more sessions      Recommendations for follow up therapy are one component of a multi-disciplinary discharge planning process, led by the attending physician.  Recommendations may be updated based on patient status, additional functional criteria and insurance  authorization.   Follow Up Recommendations  Home health OT    Assistance Recommended at Discharge Frequent or constant Supervision/Assistance  Functional Status Assessment  Patient has had a recent decline in their functional status and demonstrates the ability to make significant improvements in function in a reasonable and predictable amount of time.  Equipment Recommendations  Tub/shower seat    Recommendations for Other Services       Precautions / Restrictions Restrictions Weight Bearing Restrictions: No      Mobility Bed Mobility Overal bed mobility: Independent                  Transfers Overall transfer level: Needs assistance Equipment used: Rolling walker (2 wheels) Transfers: Sit to/from Stand;Stand Pivot Transfers Sit to Stand: Min guard Stand pivot transfers: Min guard         General transfer comment: cues for technique and orientation of RW to body      Balance Overall balance assessment: No apparent balance deficits (not formally assessed)                                         ADL either performed or assessed with clinical judgement   ADL Overall ADL's : Needs assistance/impaired Eating/Feeding: Set up   Grooming: Min guard;Standing simulated            Upper Body Dressing : Set up;Sitting   Lower Body Dressing: Min guard;Sit to/from stand   Toilet Transfer: Min guard;Rolling walker (2 wheels)   Toileting- Clothing Manipulation and Hygiene: Minimal assistance;Sit to/from stand       Functional mobility during ADLs:  Min guard General ADL Comments: limited session to EOB d/t lines/leads. vitals stable throughout, demo's need for cues and up to min guard for steadying but no overt LOB throughout session.     Vision Baseline Vision/History: 1 Wears glasses Vision Assessment?: Yes Additional Comments: No visual deficits or changes observed     Perception     Praxis      Pertinent Vitals/Pain Pain  Assessment: No/denies pain     Hand Dominance Right   Extremity/Trunk Assessment Upper Extremity Assessment Upper Extremity Assessment: Overall WFL for tasks assessed   Lower Extremity Assessment Lower Extremity Assessment: Defer to PT evaluation   Cervical / Trunk Assessment Cervical / Trunk Assessment: Normal   Communication Communication Communication: No difficulties   Cognition Arousal/Alertness: Awake/alert Behavior During Therapy: WFL for tasks assessed/performed Overall Cognitive Status: Within Functional Limits for tasks assessed                                 General Comments: no concerns for cognitive status observed during evaluation.     General Comments  VSS throughout session. EOB BP 128/87, Standing briefly at EOB 119/75    Exercises     Shoulder Instructions      Home Living Family/patient expects to be discharged to:: Private residence Living Arrangements: Spouse/significant other Available Help at Discharge: Family;Available 24 hours/day Type of Home: House Home Access: Stairs to enter [employer] of Steps: 3 STE no rails   Home Layout: One level;Laundry or work area in basement     [employer] Shower/Tub: Occupational psychologist: [place]: Conservation officer, nature (2 wheels);Cane - single point   Additional Comments: endorses difference in sensation to R LE from hx of R knee replacement. no changes during assessment      Prior Functioning/Environment Prior Level of Function : Independent/Modified Independent             Mobility Comments: no DME at baseline ADLs Comments: indep all ADLs and IADL        OT Problem List: Decreased strength;Decreased activity tolerance;Impaired balance (sitting and/or standing);Decreased cognition      OT Treatment/Interventions: Self-care/ADL training;Therapeutic exercise;Therapeutic activities;Balance training    OT Goals(Current goals can be found in  the care plan section) Acute Rehab OT Goals OT Goal Formulation: With patient Time For Goal Achievement: 03/06/21 Potential to Achieve Goals: Good ADL Goals Additional ADL Goal #1: pt will be mod indep with all ADLs and self care transfers with use of LRAD  OT Frequency: Min 2X/week (1-2 additional sessions)   Barriers to D/C:            Co-evaluation              AM-PAC OT "6 Clicks" Daily Activity     Outcome Measure Help from another person eating meals?: None Help from another person taking care of personal grooming?: A Little Help from another person toileting, which includes using toliet, bedpan, or urinal?: A Little Help from another person bathing (including washing, rinsing, drying)?: A Little Help from another person to put on and taking off regular upper body clothing?: A Little Help from another person to put on and taking off regular lower body clothing?: A Little 6 Click Score: 19   End of Session Equipment Utilized During Treatment: Rolling walker (2 wheels) Nurse Communication: Mobility status  Activity Tolerance: Patient tolerated treatment well Patient left: in  bed;with bed alarm set;with call bell/phone within reach;with family/visitor present  OT Visit Diagnosis: Unsteadiness on feet (R26.81)                Time: 4917-9150 OT Time Calculation (min): 31 min Charges:  OT General Charges $OT Visit: 1 Visit OT Evaluation $OT Eval Moderate Complexity: 1 Mod  Briena Swingler OTR/L acute rehab services Office: [phone]  02/14/2021, 12:21PM

## 2021-02-14 NOTE — Progress Notes (Signed)
PT Cancellation Note  Patient Details Name: [name] MRN: [id number] DOB: 1950-12-07   Cancelled Treatment:    Reason Eval/Treat Not Completed: Active bedrest order. PT on bedrest for 24 hours starting at 2235 on 02/13/2021. PT will follow up tomorrow once pt is off bedrest.   Zenaida Niece 02/14/2021, 1:26 PM

## 2021-02-14 NOTE — Progress Notes (Signed)
STROKE TEAM PROGRESS NOTE   INTERVAL HISTORY Patient is seen in room with her husband at the bedside.  She stated that she was admitted after feeling heaviness of the legs, weakness of the right hand and a headache.  A CT was performed in the ED which revealed a left basal ganglia ICH.  Vitals:   02/14/21 0753 02/14/21 1051 02/14/21 1053 02/14/21 1101  BP: 127/78  102/64 102/64  Pulse: 80  68 70  Resp: 16  (!) 23   Temp: (!) 97.5 F (36.4 C) 97.8 F (36.6 C)    TempSrc:  Axillary    SpO2: 98%  95%   Weight:      Height:       CBC:  Recent Labs  Lab 02/13/21 1543  WBC 9.5  NEUTROABS 6.1  HGB 12.7  HCT 40.8  MCV 90.9  PLT 093   Basic Metabolic Panel:  Recent Labs  Lab 02/13/21 1543  NA 139  K 3.7  CL 104  CO2 28  GLUCOSE 93  BUN 17  CREATININE 0.73  CALCIUM 9.9   Lipid Panel: No results for input(s): CHOL, TRIG, HDL, CHOLHDL, VLDL, LDLCALC in the last 168 hours. HgbA1c: No results for input(s): HGBA1C in the last 168 hours. Urine Drug Screen:  Recent Labs  Lab 02/13/21 1750  LABOPIA NONE DETECTED  COCAINSCRNUR NONE DETECTED  LABBENZ NONE DETECTED  AMPHETMU NONE DETECTED  THCU NONE DETECTED  LABBARB NONE DETECTED    Alcohol Level No results for input(s): ETH in the last 168 hours.  IMAGING past 24 hours CT HEAD WO CONTRAST  Result Date: 02/13/2021 CLINICAL DATA:  Concern for bleed. EXAM: CT HEAD WITHOUT CONTRAST TECHNIQUE: Contiguous axial images were obtained from the base of the skull through the vertex without intravenous contrast. COMPARISON:  None. FINDINGS: Brain: Intraparenchymal hemorrhage in the left basal ganglia along the external capsule measuring 2.4 x 1.0 x 1.8 cm on images 16/2 . There is mild adjacent edema without significant mass effect. Remote lacunar type infarct in the left cerebellar hemisphere. No hydrocephalus. No extra-axial fluid collection. No significant parenchymal volume loss. Vascular: No hyperdense vessel. Atherosclerotic  calcifications of the internal carotid arteries at skull base. Skull: Hyperostosis interna, normal variant. Negative for fracture or focal lesion. Sinuses/Orbits: Visualized portions of the paranasal sinuses and mastoid air cells are predominantly clear. Orbits are unremarkable. Other: None. IMPRESSION: 1. 2.4 x 1.0 x 1.8 cm intraparenchymal hemorrhage in the left basal ganglia along the external capsule with mild adjacent edema but without significant mass effect. 2. Remote lacunar type infarct in the left cerebellar hemisphere. These results were called by telephone at the time of interpretation on 02/13/2021 at 4:01 pm to provider [facility] , who verbally acknowledged these results. Electronically Signed   By: Dahlia Bailiff M.D.   On: 02/13/2021 16:04   MR BRAIN WO CONTRAST  Result Date: 02/14/2021 CLINICAL DATA:  70 year old female with left lentiform acute hemorrhage on head CT yesterday. EXAM: MRI HEAD WITHOUT CONTRAST TECHNIQUE: Multiplanar, multiecho pulse sequences of the brain and surrounding structures were obtained without intravenous contrast. COMPARISON:  Head CT 02/13/2021. FINDINGS: Brain: Biconvex mostly T2 hypointense, T1 I so-to hyperintense intra-axial hemorrhage re-demonstrated encompassing 23 by 9 by 18 mm (AP by transverse by CC), for an estimated blood volume of 2 mL. Surrounding edema. No significant regional mass effect. Susceptibility on DWI with no larger area of restricted diffusion. Chronic area of hemorrhage and encephalomalacia in the right occipital pole (series 14,  image 19). And there are also chronic left cerebellar SCA territory infarcts. a and there is a subtle area of right superior temporal gyrus encephalomalacia best seen on series 11, image 11. But no other chronic cerebral blood products are identified. Elsewhere the deep gray matter nuclei appear normal. No restricted diffusion. No midline shift, mass effect, evidence of mass lesion, ventriculomegaly, extra-axial  collection. Cervicomedullary junction and pituitary are within normal limits. No other cortical encephalomalacia identified. Mild for age scattered small white matter T2 and FLAIR hyperintense foci otherwise. Vascular: Major intracranial vascular flow voids are preserved. The left vertebral artery appears mildly dominant. Skull and upper cervical spine: Negative. Visualized bone marrow signal is within normal limits. Sinuses/Orbits: Postoperative changes to both globes, otherwise negative. Other: Trace left mastoid air cell fluid. Significance is doubtful, negative visible nasopharynx and grossly normal visible other internal auditory structures. IMPRESSION: 1. Biconvex hemorrhage at the left lentiform nucleus with estimated blood volume of 2 mL. Surrounding edema but no significant mass effect or other complicating features. 2. Chronic hemorrhage in encephalomalacia at the right occipital pole. But no other chronic cerebral blood products. However, chronic infarcts also at the right superior temporal gyrus, left cerebellum SCA territory. 3. No other acute intracranial abnormality. Electronically Signed   By: Genevie Ann M.D.   On: 02/14/2021 10:34    PHYSICAL EXAM General:  Patient is a well-developed, well-nourished female in no acute distress   NEURO:  Mental Status: AA&Ox3  Speech/Language: speech is without dysarthria or aphasia.  Naming, repetition, fluency, and comprehension intact.  Cranial Nerves:  II: PERRL. Visual fields full.  III, IV, VI: EOMI. Eyelids elevate symmetrically.  V: Sensation is intact to light touch and symmetrical to face.  VII: Smile is symmetrical. Able to puff cheeks and raise eyebrows.  VIII: hearing intact to voice. IX, X: Palate elevates symmetrically. Phonation is normal.  LY:[id number] shrug 5/5. XII: tongue is midline without fasciculations. Motor: 4/5 strength to RUE and RLE 5/5 strength to all LUE and LLE.  Tone: is normal and bulk is normal Sensation- Intact to  light touch bilaterally. Extinction absent to light touch to DSS. Sharp/Dull   Vibration.   Coordination: Mild right-sided ataxia. No drift.  Gait- deferred   ASSESSMENT/PLAN [name] is a 70 y.o. female with history of HTN and stroke presenting with right sided weakness and headache. CT reveals left basal ganglia IPH.  Patient's blood pressure has been on the low end of normal.  Will hold her home antihypertensives at this time.  She states that she has recently lost about 40 pounds and may need reduced doses of these medications at discharge.  Awaiting stroke workup labs and echocardiogram.  IPH:  left basal ganglia CT head  IPH in left basal ganglia with mild edema but no mass effect  MRI  Hemorrhage at left lentiform nucleus, chronic hemorrhage in encephalomalacia at right occipital pole 2D Echo pending LDL 62 HgbA1c No results found for requested labs within last 26280 hours. VTE prophylaxis - SCDs    Diet   Diet regular Room service appropriate? Yes; Fluid consistency: Thin   Aspirin 81 mg BID  prior to admission, now on No antithrombotic secondary to IPH Therapy recommendations:  pending Disposition:  pending  Hypertension Home meds:  atenolol/chlorthalidone 50/25 mg daily, lisinopril 2.5 mg daily Stable Goal SBP <140 BP is at low end of normal in setting of recent weight loss, will hold home antihypertensives and restart if needed Long-term BP goal normotensive  Hyperlipidemia Home meds:  simvastatin 20 mg daily, not resumed secondary to IPH LDL pending, goal < 70 High intensity statin to be started at discharge if needed Continue statin at discharge  Risk for Diabetes type II  Home meds:  none HgbA1c pending CBGs No results for input(s): GLUCAP in the last 72 hours.   Other Stroke Risk Factors Advanced Age >/= 45  Not a cigarette smoker but exposed to secondhand smoke, advised to avoid exposure Obesity, Body mass index is 29.26 kg/m., BMI >/= 30  associated with increased stroke risk, recommend weight loss, diet and exercise as appropriate- patient has recently been following a diet for weight loss and has lost 40 pounds  Other Active Problems History of stroke Patient states that she had a right-sided stroke many years ago Old infarcts noted on MRI at right superior temporal gyrus and left cerebellum  Hospital day # [place], NP   ATTENDING ATTESTATION:  Left BG ICH with mild right sided weakness. Echo today. Monitoring BP and holding antiplatelet meds.   Dr. Reeves Forth evaluated pt independently, reviewed imaging, chart, labs. Discussed and formulated plan with the APP. Please see APP note above for details.   Total 30 minutes spent on counseling patient and coordinating care, writing notes and reviewing chart.    Burlie Cajamarca,MD   To contact Stroke Continuity provider, please refer to [email or web address] After hours, contact General Neurology

## 2021-02-14 NOTE — Progress Notes (Signed)
  Echocardiogram 2D Echocardiogram has been performed.  [name] 02/14/2021, 2:00 PM

## 2021-02-14 NOTE — Plan of Care (Signed)
Pt is alert oriented x 4. Pt is able to ambulate just unsteady. Pt has right sided facial droop, right sided drift. Pt is on bedside monitor. NIH completed Q2. Provider paged to inform of new numbness and tingling to right lower leg, from knee down.    Problem: Education: Goal: Knowledge of General Education information will improve Description: Including pain rating scale, medication(s)/side effects and non-pharmacologic comfort measures 02/14/2021 0758 by Allayne Stack, RN Outcome: Progressing 02/14/2021 0357 by Allayne Stack, RN Outcome: Progressing   Problem: Health Behavior/Discharge Planning: Goal: Ability to manage health-related needs will improve 02/14/2021 0758 by Allayne Stack, RN Outcome: Progressing 02/14/2021 0357 by Allayne Stack, RN Outcome: Progressing   Problem: Clinical Measurements: Goal: Ability to maintain clinical measurements within normal limits will improve 02/14/2021 0758 by Allayne Stack, RN Outcome: Progressing 02/14/2021 0357 by Allayne Stack, RN Outcome: Progressing Goal: Will remain free from infection 02/14/2021 0758 by Allayne Stack, RN Outcome: Progressing 02/14/2021 0357 by Allayne Stack, RN Outcome: Progressing Goal: Diagnostic test results will improve 02/14/2021 0758 by Allayne Stack, RN Outcome: Progressing 02/14/2021 0357 by Allayne Stack, RN Outcome: Progressing Goal: Respiratory complications will improve 02/14/2021 0758 by Allayne Stack, RN Outcome: Progressing 02/14/2021 0357 by Allayne Stack, RN Outcome: Progressing Goal: Cardiovascular complication will be avoided 02/14/2021 0758 by Allayne Stack, RN Outcome: Progressing 02/14/2021 0357 by Allayne Stack, RN Outcome: Progressing   Problem: Activity: Goal: Risk for activity intolerance will decrease 02/14/2021 0758 by Allayne Stack, RN Outcome: Progressing 02/14/2021 0357 by Allayne Stack, RN Outcome:  Progressing   Problem: Nutrition: Goal: Adequate nutrition will be maintained 02/14/2021 0758 by Allayne Stack, RN Outcome: Progressing 02/14/2021 0357 by Allayne Stack, RN Outcome: Progressing   Problem: Coping: Goal: Level of anxiety will decrease 02/14/2021 0758 by Allayne Stack, RN Outcome: Progressing 02/14/2021 0357 by Allayne Stack, RN Outcome: Progressing   Problem: Elimination: Goal: Will not experience complications related to bowel motility 02/14/2021 0758 by Allayne Stack, RN Outcome: Progressing 02/14/2021 0357 by Allayne Stack, RN Outcome: Progressing Goal: Will not experience complications related to urinary retention 02/14/2021 0758 by Allayne Stack, RN Outcome: Progressing 02/14/2021 0357 by Allayne Stack, RN Outcome: Progressing   Problem: Pain Managment: Goal: General experience of comfort will improve 02/14/2021 0758 by Allayne Stack, RN Outcome: Progressing 02/14/2021 0357 by Allayne Stack, RN Outcome: Progressing   Problem: Safety: Goal: Ability to remain free from injury will improve 02/14/2021 0758 by Allayne Stack, RN Outcome: Progressing 02/14/2021 0357 by Allayne Stack, RN Outcome: Progressing   Problem: Safety: Goal: Ability to remain free from injury will improve 02/14/2021 0758 by Allayne Stack, RN Outcome: Progressing 02/14/2021 0357 by Allayne Stack, RN Outcome: Progressing   Problem: Education: Goal: Knowledge of disease or condition will improve 02/14/2021 0758 by Allayne Stack, RN Outcome: Progressing 02/14/2021 0357 by Allayne Stack, RN Outcome: Progressing Goal: Knowledge of secondary prevention will improve (SELECT ALL) 02/14/2021 0758 by Allayne Stack, RN Outcome: Progressing 02/14/2021 0357 by Allayne Stack, RN Outcome: Progressing Goal: Knowledge of patient specific risk factors will improve (INDIVIDUALIZE FOR PATIENT) 02/14/2021 0758 by  Allayne Stack, RN Outcome: Progressing 02/14/2021 0357 by Allayne Stack, RN Outcome: Progressing   Problem: Self-Care: Goal: Ability to participate in self-care as condition permits will improve 02/14/2021 0758 by Allayne Stack, RN Outcome: Progressing 02/14/2021 0357 by  Allayne Stack, RN Outcome: Progressing   Problem: Health Behavior/Discharge Planning: Goal: Ability to manage health-related needs will improve 02/14/2021 0758 by Allayne Stack, RN Outcome: Progressing 02/14/2021 0357 by Allayne Stack, RN Outcome: Progressing

## 2021-02-14 NOTE — Plan of Care (Signed)
Pt is alert oriented x 4. Unsteady but ambulatory. Pt denies pain. Neuro checks/mod NIHSS per order. No changes noted. Pt assisted to bathroom. Pt passed swallow eval. Waiting for MRI.    Problem: Education: Goal: Knowledge of General Education information will improve Description: Including pain rating scale, medication(s)/side effects and non-pharmacologic comfort measures Outcome: Progressing   Problem: Health Behavior/Discharge Planning: Goal: Ability to manage health-related needs will improve Outcome: Progressing   Problem: Clinical Measurements: Goal: Ability to maintain clinical measurements within normal limits will improve Outcome: Progressing Goal: Will remain free from infection Outcome: Progressing Goal: Diagnostic test results will improve Outcome: Progressing Goal: Respiratory complications will improve Outcome: Progressing Goal: Cardiovascular complication will be avoided Outcome: Progressing   Problem: Activity: Goal: Risk for activity intolerance will decrease Outcome: Progressing   Problem: Nutrition: Goal: Adequate nutrition will be maintained Outcome: Progressing   Problem: Coping: Goal: Level of anxiety will decrease Outcome: Progressing   Problem: Elimination: Goal: Will not experience complications related to bowel motility Outcome: Progressing Goal: Will not experience complications related to urinary retention Outcome: Progressing   Problem: Pain Managment: Goal: General experience of comfort will improve Outcome: Progressing   Problem: Safety: Goal: Ability to remain free from injury will improve Outcome: Progressing   Problem: Skin Integrity: Goal: Risk for impaired skin integrity will decrease Outcome: Progressing   Problem: Education: Goal: Knowledge of disease or condition will improve Outcome: Progressing Goal: Knowledge of secondary prevention will improve (SELECT ALL) Outcome: Progressing Goal: Knowledge of patient specific  risk factors will improve (INDIVIDUALIZE FOR PATIENT) Outcome: Progressing   Problem: Health Behavior/Discharge Planning: Goal: Ability to manage health-related needs will improve Outcome: Progressing   Problem: Self-Care: Goal: Ability to participate in self-care as condition permits will improve Outcome: Progressing

## 2021-02-15 ENCOUNTER — Telehealth: Payer: Self-pay | Admitting: Internal Medicine

## 2021-02-15 DIAGNOSIS — I610 Nontraumatic intracerebral hemorrhage in hemisphere, subcortical: Secondary | ICD-10-CM | POA: Diagnosis not present

## 2021-02-15 LAB — HEMOGLOBIN A1C
Hgb A1c MFr Bld: 5.3 % (ref 4.8–5.6)
Mean Plasma Glucose: 105.41 mg/dL

## 2021-02-15 LAB — LIPID PANEL
Cholesterol: 178 mg/dL (ref 0–200)
HDL: 37 mg/dL — ABNORMAL LOW (ref >40)
LDL Cholesterol: 122 mg/dL — ABNORMAL HIGH (ref 0–99)
Total CHOL/HDL Ratio: 4.8 RATIO
Triglycerides: 93 mg/dL (ref <150)
VLDL: 19 mg/dL (ref 0–40)

## 2021-02-15 LAB — SURGICAL PCR SCREEN
MRSA, PCR: NEGATIVE
Staphylococcus aureus: NEGATIVE

## 2021-02-15 MED ORDER — SIMVASTATIN 20 MG PO TABS
40.0000 mg | ORAL_TABLET | Freq: Every day | ORAL | Status: DC
  Administered 2021-02-15: 40 mg via ORAL
  Filled 2021-02-15: qty 2

## 2021-02-15 MED ORDER — ASPIRIN EC 81 MG PO TBEC
81.0000 mg | DELAYED_RELEASE_TABLET | Freq: Every day | ORAL | Status: DC
  Administered 2021-02-15 – 2021-02-16 (×2): 81 mg via ORAL
  Filled 2021-02-15 (×2): qty 1

## 2021-02-15 MED ORDER — SODIUM CHLORIDE 0.9 % IV SOLN: INTRAVENOUS | Status: DC

## 2021-02-15 NOTE — TOC Initial Note (Signed)
Transition of Care [facility]) - Initial/Assessment Note    Patient Details  Name: [name] MRN: [id number] Date of Birth: 08/15/1950  [facility] CM/SW Contact:    Pollie Friar, RN Phone Number: 02/15/2021, 3:21 PM  Clinical Narrative:                 Patient is from home with her spouse. Recommendations are for outpatient therapy. Pt prefers to attend at [facility]. Information on the AVS. No DME needs. Pt denies issues with transportation or home medications.  TOC following.  Expected Discharge Plan: OP Rehab Barriers to Discharge: Continued Medical Work up   Patient Goals and CMS Choice     Choice offered to / list presented to : Patient  Expected Discharge Plan and Services Expected Discharge Plan: OP Rehab   Discharge Planning Services: CM Consult   Living arrangements for the past 2 months: Single Family Home                                      Prior Living Arrangements/Services Living arrangements for the past 2 months: Single Family Home Lives with:: Spouse Patient language and need for interpreter reviewed:: Yes Do you feel safe going back to the place where you live?: Yes            Criminal Activity/Legal Involvement Pertinent to Current Situation/Hospitalization: No - Comment as needed  Activities of Daily Living Home Assistive Devices/Equipment: Eyeglasses, Environmental consultant (specify type), Cane (specify quad or straight) ADL Screening (condition at time of admission) Patient's cognitive ability adequate to safely complete daily activities?: Yes Is the patient deaf or have difficulty hearing?: No Does the patient have difficulty seeing, even when wearing glasses/contacts?: No Does the patient have difficulty concentrating, remembering, or making decisions?: No Patient able to express need for assistance with ADLs?: Yes Does the patient have difficulty dressing or bathing?: No Independently performs ADLs?: Yes (appropriate for developmental  age) Does the patient have difficulty walking or climbing stairs?: No Weakness of Legs: Right Weakness of Arms/Hands: Right  Permission Sought/Granted                  Emotional Assessment Appearance:: Appears stated age Attitude/Demeanor/Rapport: Engaged Affect (typically observed): Accepting Orientation: : Oriented to Self, Oriented to Place, Oriented to  Time, Oriented to Situation   Psych Involvement: No (comment)  Admission diagnosis:  ICH (intracerebral hemorrhage) ([place]) [I61.9] Nontraumatic intracerebral hemorrhage, unspecified cerebral location, unspecified laterality ([place]) [I61.9] Stroke [facility]) [I63.9] Patient Active Problem List   Diagnosis Date Noted   ICH (intracerebral hemorrhage) ([place]) 02/13/2021   Stroke ([place]) 02/13/2021   COVID-19 01/22/2021   Dog bite of middle finger 01/17/2021   Atypical chest pain 10/07/2020   Iron deficiency 10/07/2020   Aortic atherosclerosis ([place]) 03/17/2020   Lung nodule seen on imaging study 02/24/2020   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   Chronic left shoulder pain 08/23/2018   Chronic diarrhea 12/07/2014   Segmental colitis with rectal bleeding ([place]) 09/12/2014   Routine general medical examination at a health care facility 06/05/2014   Overweight 06/13/2011   Hyperlipidemia 02/17/2007   Essential hypertension 01/08/2007   Osteoarthritis 01/08/2007   PCP:  Hoyt Koch, MD Pharmacy:   [id number] [id number]: [phone]: [phone] [facility]  [place] [place] [address] [place] [address] Phone: [phone] Fax: [phone]     Social Determinants of Health (SDOH) Interventions    Readmission Risk Interventions No flowsheet data found.

## 2021-02-15 NOTE — Progress Notes (Signed)
SLP Cancellation Note  Patient Details Name: MAIKA MCELVEEN MRN: [id number] DOB: 1950/12/09   Cancelled treatment:       Reason Eval/Treat Not Completed: SLP screened, no needs identified, will sign off   Antonios Ostrow, Katherene Ponto 02/15/2021, 2:39 PM

## 2021-02-15 NOTE — Progress Notes (Addendum)
    [facility] has been requested to perform a transesophageal echocardiogram on this patient for intraparenchymal hemorrhage.  After careful review of history and examination, the risks and benefits of transesophageal echocardiogram have been explained including risks of esophageal damage, perforation (1:10,000 risk), bleeding, pharyngeal hematoma as well as other potential complications associated with conscious sedation including aspiration, arrhythmia, respiratory failure and death. Alternatives to treatment were discussed, questions were answered. Patient is willing to proceed. Scheduled tomorrow @ 2pm with Dr. Acie Fredrickson. Orders written.  [name], PA-C 02/15/2021 2:35 PM

## 2021-02-15 NOTE — Progress Notes (Signed)
OT Cancellation Note  Patient Details Name: [name] MRN: [id number] DOB: 10-Mar-1951   Cancelled Treatment:    Reason Eval/Treat Not Completed: Active bedrest order Patient continues to have active bedrest order, will follow once lifted.   Corinne Ports E. Amber, [place] [facility] [place] 02/15/2021, 12:01 PM

## 2021-02-15 NOTE — Progress Notes (Addendum)
STROKE TEAM PROGRESS NOTE   INTERVAL HISTORY Patient is lying in bed talking on the phone. No family present at the bedside today. She is awake and alert. She denies any new neurological symptoms overnight. She states her symptoms have resolved except for lingering tingling on right foot. Discussed with patient we are recommending a TEE and 30 day cardiac monitoring post discharge. All questions answered and patient verbalized understanding and satisfied with answers.   Patient states she has prior history of strokes and was admitted to [facility] but I am unable to find any records in electronic medical records epic or in [place] signs are stable.  Neurological exam is unchanged Vitals:   02/15/21 0445 02/15/21 0545 02/15/21 0645 02/15/21 0800  BP: 120/84 130/82 125/87 138/87  Pulse: 64 69 73 91  Resp: 16 15 15 18   Temp: 97.8 F (36.6 C)   97.6 F (36.4 C)  TempSrc: Axillary   Oral  SpO2: 96% 98% 97% 99%  Weight:      Height:       CBC:  Recent Labs  Lab 02/13/21 1543  WBC 9.5  NEUTROABS 6.1  HGB 12.7  HCT 40.8  MCV 90.9  PLT 557    Basic Metabolic Panel:  Recent Labs  Lab 02/13/21 1543  NA 139  K 3.7  CL 104  CO2 28  GLUCOSE 93  BUN 17  CREATININE 0.73  CALCIUM 9.9    Lipid Panel:  Recent Labs  Lab 02/15/21 0218  CHOL 178  TRIG 93  HDL 37*  CHOLHDL 4.8  VLDL 19  LDLCALC 122*   HgbA1c:  Recent Labs  Lab 02/15/21 0218  HGBA1C 5.3   Urine Drug Screen:  Recent Labs  Lab 02/13/21 1750  LABOPIA NONE DETECTED  COCAINSCRNUR NONE DETECTED  LABBENZ NONE DETECTED  AMPHETMU NONE DETECTED  THCU NONE DETECTED  LABBARB NONE DETECTED     Alcohol Level No results for input(s): ETH in the last 168 hours.  IMAGING past 24 hours ECHOCARDIOGRAM COMPLETE  Result Date: 02/14/2021    ECHOCARDIOGRAM REPORT   Patient Name:   LORRETTA KERCE Date of Exam: 02/14/2021 Medical Rec #:  [id number]        Height:       62.0 in Accession #:    [id number]        Weight:       160.0 lb Date of Birth:  1950-05-21        BSA:          1.739 m Patient Age:    70 years         BP:           102/64 mmHg Patient Gender: F                HR:           74 bpm. Exam Location:  Inpatient Procedure: 2D Echo Indications:    stroke  History:        Patient has no prior history of Echocardiogram examinations.                 Risk Factors:Hypertension and Dyslipidemia.  Sonographer:    [name] RDCS Referring Phys: [phone] MCNEILL P [place]  1. Left ventricular ejection fraction, by estimation, is 65 to 70%. The left ventricle has normal function. The left ventricle has no regional wall motion abnormalities. Left ventricular diastolic parameters were normal.  2. Right ventricular systolic function  is normal. The right ventricular size is normal. There is normal pulmonary artery systolic pressure.  3. Left atrial size was mildly dilated.  4. The mitral valve is normal in structure. Mild mitral valve regurgitation. No evidence of mitral stenosis.  5. The aortic valve is tricuspid. Aortic valve regurgitation is not visualized. No aortic stenosis is present.  6. The inferior vena cava is normal in size with greater than 50% respiratory variability, suggesting right atrial pressure of 3 mmHg. FINDINGS  Left Ventricle: Left ventricular ejection fraction, by estimation, is 65 to 70%. The left ventricle has normal function. The left ventricle has no regional wall motion abnormalities. The left ventricular internal cavity size was normal in size. There is  no left ventricular hypertrophy. Left ventricular diastolic parameters were normal. Right Ventricle: The right ventricular size is normal. No increase in right ventricular wall thickness. Right ventricular systolic function is normal. There is normal pulmonary artery systolic pressure. The tricuspid regurgitant velocity is 2.82 m/s, and  with an assumed right atrial pressure of 3 mmHg, the estimated right ventricular  systolic pressure is 92.4 mmHg. Left Atrium: Left atrial size was mildly dilated. Right Atrium: Right atrial size was normal in size. Pericardium: There is no evidence of pericardial effusion. Mitral Valve: The mitral valve is normal in structure. Mild mitral valve regurgitation. No evidence of mitral valve stenosis. Tricuspid Valve: The tricuspid valve is normal in structure. Tricuspid valve regurgitation is mild . No evidence of tricuspid stenosis. Aortic Valve: The aortic valve is tricuspid. Aortic valve regurgitation is not visualized. No aortic stenosis is present. Aortic valve mean gradient measures 4.0 mmHg. Aortic valve peak gradient measures 8.6 mmHg. Aortic valve area, by VTI measures 1.41 cm. Pulmonic Valve: The pulmonic valve was not well visualized. Pulmonic valve regurgitation is trivial. No evidence of pulmonic stenosis. Aorta: The aortic root is normal in size and structure. Venous: The inferior vena cava is normal in size with greater than 50% respiratory variability, suggesting right atrial pressure of 3 mmHg. IAS/Shunts: No atrial level shunt detected by color flow Doppler.  LEFT VENTRICLE PLAX 2D LVIDd:         4.10 cm   Diastology LVIDs:         2.20 cm   LV e' medial:    6.64 cm/s LV PW:         0.80 cm   LV E/e' medial:  12.3 LV IVS:        0.80 cm   LV e' lateral:   10.00 cm/s LVOT diam:     1.90 cm   LV E/e' lateral: 8.2 LV SV:         47 LV SV Index:   27 LVOT Area:     2.84 cm  RIGHT VENTRICLE             IVC RV S prime:     14.80 cm/s  IVC diam: 1.70 cm TAPSE (M-mode): 2.7 cm LEFT ATRIUM             Index        RIGHT ATRIUM           Index LA diam:        3.70 cm 2.13 cm/m   RA Area:     13.10 cm LA Vol (A2C):   66.9 ml 38.48 ml/m  RA Volume:   29.80 ml  17.14 ml/m LA Vol (A4C):   66.0 ml 37.96 ml/m LA Biplane Vol: 68.3 ml 39.28 ml/m  AORTIC VALVE AV Area (Vmax):    1.51 cm AV Area (Vmean):   1.62 cm AV Area (VTI):     1.41 cm AV Vmax:           147.02 cm/s AV Vmean:           91.163 cm/s AV VTI:            0.334 m AV Peak Grad:      8.6 mmHg AV Mean Grad:      4.0 mmHg LVOT Vmax:         78.50 cm/s LVOT Vmean:        52.100 cm/s LVOT VTI:          0.166 m LVOT/AV VTI ratio: 0.50  AORTA Ao Root diam: 2.70 cm MITRAL VALVE               TRICUSPID VALVE MV Area (PHT): 3.77 cm    TR Peak grad:   31.8 mmHg MV Decel Time: 201 msec    TR Vmax:        282.00 cm/s MV E velocity: 81.90 cm/s MV A velocity: 52.80 cm/s  SHUNTS MV E/A ratio:  1.55        Systemic VTI:  0.17 m                            Systemic Diam: 1.90 cm Carlyle Dolly MD Electronically signed by Carlyle Dolly MD Signature Date/Time: 02/14/2021/4:25:44 PM    Final     PHYSICAL EXAM General:  Patient is a well-developed, well-nourished female in no acute distress   NEURO:  Mental Status: AA&Ox3  Speech/Language: speech is without dysarthria or aphasia.  Naming, repetition, fluency, and comprehension intact.  Cranial Nerves:  II: PERRL. Visual fields full.  III, IV, VI: EOMI. Eyelids elevate symmetrically.  V: Sensation is intact to light touch and symmetrical to face.  VII: Smile is symmetrical. Able to puff cheeks and raise eyebrows.  VIII: hearing intact to voice. IX, X: Palate elevates symmetrically. Phonation is normal.  UX:NATFTDDU shrug 5/5. XII: tongue is midline without fasciculations. Motor: 4/5 strength to RUE and RLE 5/5 strength to all LUE and LLE.  Tone: is normal and bulk is normal Sensation- Intact to light touch bilaterally. Extinction absent to light touch to DSS. Sharp/Dull   Vibration.   Coordination: Mild right-sided ataxia. No drift.  Gait- deferred   ASSESSMENT/PLAN Ms. ADRIEN DIETZMAN is a 70 y.o. female with history of HTN and stroke presenting with right sided weakness and headache. CT reveals left basal ganglia IPH.  Patient's blood pressure has been on the low end of normal.  Will hold her home antihypertensives at this time.  She states that she has recently lost about 40  pounds and may need reduced doses of these medications at discharge.  Awaiting stroke workup labs and echocardiogram.  IPH:  left basal ganglia etiology indeterminate possibly hemorrhagic infarct doubt primary hypertensive as blood pressure not significantly elevated.  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 No atrial shunt  EKG SR w PAC's TEE ordered  Recommend 30 day cardiac monitor post discharge LDL 62 HgbA1c 5.3  VTE prophylaxis - SCDs    Diet   Diet regular Room service appropriate? Yes; Fluid consistency: Thin   Aspirin 81 mg BID  prior to admission, now on aspirin 81 mg daily  Therapy recommendations:  pending Disposition:  pending  Hypertension Home meds:  atenolol/chlorthalidone 50/25 mg daily, lisinopril 2.5 mg daily Stable Goal SBP <140 Home meds being held with low end BP. Restart when appropriate Long-term BP goal normotensive  Hyperlipidemia Home meds:  simvastatin 20 mg daily,  LDL 122, goal < 70 Increased simvastatin to 40 mg Continue statin at discharge   Other Stroke Risk Factors Advanced Age >/= 30  Not a cigarette smoker but exposed to secondhand smoke, advised to avoid exposure Obesity, Body mass index is 29.26 kg/m., BMI >/= 30 associated with increased stroke risk, recommend weight loss, diet and exercise as appropriate- patient has recently been following a diet for weight loss and has lost 40 pounds  Other Active Problems History of stroke Old infarcts noted on MRI at right superior temporal gyrus and left cerebellum  Hospital day # 2  Rogers Blocker Harlin Rain, NP   I have personally obtained history,examined this patient, reviewed notes,  independently viewed imaging studies, participated in medical decision making and plan of care.ROS completed by me personally and pertinent positives fully documented  I have made any additions or clarifications directly to the above note. Agree with note above.  Patient presented with a left basal ganglia hemorrhage but MRI also shows previous hemorrhagic right occipital infarct as well as nonhemorrhagic left superior cerebellar and right superior temporal infarcts of embolic etiology.  She is likely not a good long-term anticoagulation candidate and hence will not do a loop recorder to check TEE for cardiac source of embolism and 30-day heart monitor after discharge.  Start aspirin 81 mg daily and aggressive risk factor modification.  Likely discharge home tomorrow after TEE.  Greater than 50% time during this 25-minute visit was spent in counseling and coordination of care and discussion with patient and care team and answering questions.  Antony Contras, MD Medical Director [facility] Pager: [phone] 02/15/2021 3:18 PM  To contact Stroke Continuity provider, please refer to [email or web address] After hours, contact General Neurology

## 2021-02-15 NOTE — Evaluation (Signed)
Physical Therapy Evaluation Patient Details Name: [name] MRN: [id number] DOB: [birth year] Today's Date: 02/15/2021  History of Present Illness  Pt is a 70 yo female who presents with unsteadiness and mild R facial droop. 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   Clinical Impression  Pt admitted with above diagnosis. Pt currently with functional limitations due to the deficits listed below (see PT Problem List). At the time of PT eval pt was able to perform transfers with modified independence and ambulation with gross min guard assist. Occasional min assist provided during completion of the DGI. She scored 13/24 indicating pt is at a higher risk for falls at this time. Acutely, pt will benefit from skilled PT to increase their independence and safety with mobility to allow discharge to the venue listed below.          Recommendations for follow up therapy are one component of a multi-disciplinary discharge planning process, led by the attending physician.  Recommendations may be updated based on patient status, additional functional criteria and insurance authorization.  Follow Up Recommendations Outpatient PT    Assistance Recommended at Discharge PRN  Functional Status Assessment Patient has had a recent decline in their functional status and demonstrates the ability to make significant improvements in function in a reasonable and predictable amount of time.  Equipment Recommendations  None recommended by PT    Recommendations for Other Services       Precautions / Restrictions  Precautions Precautions: Fall Restrictions Weight Bearing Restrictions: No      Mobility  Bed Mobility Overal bed mobility: Independent             General bed mobility comments: HOB elevated but without any note difficulties    Transfers Overall transfer level: Modified independent Equipment used: None Transfers: Sit to/from Stand             General transfer comment: No unsteadiness or LOB noted to power-up to full stand. Pt with good awareness of lines throughout.    Ambulation/Gait Ambulation/Gait assistance: Min guard;Min assist Gait Distance (Feet): 200 Feet Assistive device: None Gait Pattern/deviations: Step-through pattern;Decreased stride length;Trunk flexed;Drifts right/left Gait velocity: Decreased Gait velocity interpretation: >2.62 ft/sec, indicative of community ambulatory   General Gait Details: True gait, at a light min guard level, however with addition of higher level balance activity, up to min assist provided. See DGI results for more detail.  Stairs Stairs: Yes Stairs assistance: Supervision Stair Management: One rail Right;Step to pattern;Forwards Number of Stairs: 5 General stair comments: As part of the DGI. Pt with step-to pattern due to prior TKR  Wheelchair Mobility    Modified Rankin (Stroke Patients Only) Modified Rankin (Stroke Patients Only) Pre-Morbid Rankin Score: No symptoms Modified Rankin: Moderately severe disability     Balance Overall balance assessment: Needs assistance Sitting-balance support: Feet supported;No upper extremity supported Sitting balance-[name] Scale: Good     Standing balance support: No upper extremity supported;During functional activity Standing balance-[name] Scale: Fair                   Standardized Balance Assessment Standardized Balance Assessment :  Dynamic Gait Index   Dynamic Gait Index Level Surface: Mild Impairment Change in Gait Speed: Mild Impairment Gait with  Horizontal Head Turns: Moderate Impairment Gait with Vertical Head Turns: Mild Impairment Gait and Pivot Turn: Mild Impairment Step Over Obstacle: Moderate Impairment Step Around Obstacles: Mild Impairment Steps: Moderate Impairment Total Score: 13       Pertinent Vitals/Pain Pain Assessment: No/denies pain    Home Living Family/patient expects to be discharged to:: Private residence Living Arrangements: Spouse/significant other Available Help at Discharge: Family;Available 24 hours/day Type of Home: House Home Access: Stairs to enter Entrance Stairs-Rails: None Entrance Stairs-Number of Steps: 3   Home Layout: One level;Laundry or work area in [place]: Advice worker (2 wheels);Cane - single point      Prior Function Prior Level of Function : Independent/Modified Independent             Mobility Comments: no DME at baseline ADLs Comments: indep all ADLs and IADL     Hand Dominance   Dominant Hand: Right    Extremity/Trunk Assessment   Upper Extremity Assessment Upper Extremity Assessment: RUE deficits/detail RUE Deficits / Details: R shoulder flexion 4-/5 strength, biceps 4/5 strength. Equal grip. RUE Sensation: WNL RUE Coordination: WNL    Lower Extremity Assessment Lower Extremity Assessment: RLE deficits/detail RLE Deficits / Details: Mild weakness in quads/hip flexors 4+/5 compared to L side. RLE Sensation: decreased light touch (R medial foot and across dorsum of foot)    Cervical / Trunk Assessment Cervical / Trunk Assessment: Normal  Communication   Communication: No difficulties  Cognition Arousal/Alertness: Awake/alert Behavior During Therapy: WFL for tasks assessed/performed Overall Cognitive Status: Within Functional Limits for tasks assessed                                          General Comments General comments (skin integrity, edema, etc.): Max HR noted was 128 bpm during gait  training    Exercises     Assessment/Plan    PT Assessment Patient needs continued PT services  PT Problem List Decreased strength;Decreased activity tolerance;Decreased balance;Decreased mobility;Decreased knowledge of use of DME;Decreased safety awareness;Decreased knowledge of precautions;Impaired sensation       PT Treatment Interventions DME instruction;Gait training;Stair training;Functional mobility training;Therapeutic activities;Therapeutic exercise;Balance training;Neuromuscular re-education;Patient/family education    PT Goals (Current goals can be found in the Care Plan section)  Acute Rehab PT Goals Patient Stated Goal: Home today but understands she will need to stay for TEE scheduled for tomorrow PT Goal Formulation: With patient Time For Goal Achievement: 02/22/21 Potential to Achieve Goals: Good Additional Goals Additional Goal #1: Pt will score 19 or greater on the DGI to indicate a lower risk for falls.    Frequency Min 3X/week   Barriers to discharge        Co-evaluation               AM-PAC PT "6 Clicks" Mobility  Outcome Measure Help needed turning from your back to your side while in a flat bed without using bedrails?: None Help needed moving from lying on your back to sitting on the side of a flat bed without using bedrails?: None Help needed moving to and from a bed to a chair (including a wheelchair)?: None Help needed standing up from a chair using your arms (e.g., wheelchair or bedside chair)?: None Help needed to walk in hospital  room?: A Little Help needed climbing 3-5 steps with a railing? : A Little 6 Click Score: 22    End of Session   Activity Tolerance: Patient tolerated treatment well Patient left: in bed;with call bell/phone within reach Nurse Communication: Mobility status PT Visit Diagnosis: Unsteadiness on feet (R26.81);Other symptoms and signs involving the nervous system (R29.898)    Time: 0449-2524 PT Time Calculation  (min) (ACUTE ONLY): 26 min   Charges:   PT Evaluation $PT Eval Moderate Complexity: 1 Mod PT Treatments $Physical Performance Test: 8-22 mins        Rolinda Roan, PT, DPT Acute Rehabilitation Services Pager: [phone] Office: [phone]   Thelma Comp 02/15/2021, 3:44 PM

## 2021-02-15 NOTE — Telephone Encounter (Signed)
Pt. [facility] 11.5.2022.   Caller believes had a mini stroke, should go to UC? Caller states equilibrium was off, dropping things, eye would not open as much as other eye, began yesterday on right side. BP is high and low. Last BP reading 139/??, caller did not give complete.  Advised to go to ED.

## 2021-02-15 NOTE — H&P (View-Only) (Signed)
STROKE TEAM PROGRESS NOTE   INTERVAL HISTORY Patient is lying in bed talking on the phone. No family present at the bedside today. She is awake and alert. She denies any new neurological symptoms overnight. She states her symptoms have resolved except for lingering tingling on right foot. Discussed with patient we are recommending a TEE and 30 day cardiac monitoring post discharge. All questions answered and patient verbalized understanding and satisfied with answers.   Patient states she has prior history of strokes and was admitted to [facility] but I am unable to find any records in electronic medical records epic or in [place] signs are stable.  Neurological exam is unchanged Vitals:   02/15/21 0445 02/15/21 0545 02/15/21 0645 02/15/21 0800  BP: 120/84 130/82 125/87 138/87  Pulse: 64 69 73 91  Resp: 16 15 15 18   Temp: 97.8 F (36.6 C)   97.6 F (36.4 C)  TempSrc: Axillary   Oral  SpO2: 96% 98% 97% 99%  Weight:      Height:       CBC:  Recent Labs  Lab 02/13/21 1543  WBC 9.5  NEUTROABS 6.1  HGB 12.7  HCT 40.8  MCV 90.9  PLT 458    Basic Metabolic Panel:  Recent Labs  Lab 02/13/21 1543  NA 139  K 3.7  CL 104  CO2 28  GLUCOSE 93  BUN 17  CREATININE 0.73  CALCIUM 9.9    Lipid Panel:  Recent Labs  Lab 02/15/21 0218  CHOL 178  TRIG 93  HDL 37*  CHOLHDL 4.8  VLDL 19  LDLCALC 122*   HgbA1c:  Recent Labs  Lab 02/15/21 0218  HGBA1C 5.3   Urine Drug Screen:  Recent Labs  Lab 02/13/21 1750  LABOPIA NONE DETECTED  COCAINSCRNUR NONE DETECTED  LABBENZ NONE DETECTED  AMPHETMU NONE DETECTED  THCU NONE DETECTED  LABBARB NONE DETECTED     Alcohol Level No results for input(s): ETH in the last 168 hours.  IMAGING past 24 hours ECHOCARDIOGRAM COMPLETE  Result Date: 02/14/2021    ECHOCARDIOGRAM REPORT   Patient Name:   [name] Date of Exam: 02/14/2021 Medical Rec #:  [id number]        Height:       62.0 in Accession #:    [id number]        Weight:       160.0 lb Date of Birth:  Dec 09, 1950        BSA:          1.739 m Patient Age:    70 years         BP:           102/64 mmHg Patient Gender: F                HR:           74 bpm. Exam Location:  Inpatient Procedure: 2D Echo Indications:    stroke  History:        Patient has no prior history of Echocardiogram examinations.                 Risk Factors:Hypertension and Dyslipidemia.  Sonographer:    [name] RDCS Referring Phys: [id number] MCNEILL P Tracy  1. Left ventricular ejection fraction, by estimation, is 65 to 70%. The left ventricle has normal function. The left ventricle has no regional wall motion abnormalities. Left ventricular diastolic parameters were normal.  2. Right ventricular systolic function  is normal. The right ventricular size is normal. There is normal pulmonary artery systolic pressure.  3. Left atrial size was mildly dilated.  4. The mitral valve is normal in structure. Mild mitral valve regurgitation. No evidence of mitral stenosis.  5. The aortic valve is tricuspid. Aortic valve regurgitation is not visualized. No aortic stenosis is present.  6. The inferior vena cava is normal in size with greater than 50% respiratory variability, suggesting right atrial pressure of 3 mmHg. FINDINGS  Left Ventricle: Left ventricular ejection fraction, by estimation, is 65 to 70%. The left ventricle has normal function. The left ventricle has no regional wall motion abnormalities. The left ventricular internal cavity size was normal in size. There is  no left ventricular hypertrophy. Left ventricular diastolic parameters were normal. Right Ventricle: The right ventricular size is normal. No increase in right ventricular wall thickness. Right ventricular systolic function is normal. There is normal pulmonary artery systolic pressure. The tricuspid regurgitant velocity is 2.82 m/s, and  with an assumed right atrial pressure of 3 mmHg, the estimated right ventricular  systolic pressure is 75.1 mmHg. Left Atrium: Left atrial size was mildly dilated. Right Atrium: Right atrial size was normal in size. Pericardium: There is no evidence of pericardial effusion. Mitral Valve: The mitral valve is normal in structure. Mild mitral valve regurgitation. No evidence of mitral valve stenosis. Tricuspid Valve: The tricuspid valve is normal in structure. Tricuspid valve regurgitation is mild . No evidence of tricuspid stenosis. Aortic Valve: The aortic valve is tricuspid. Aortic valve regurgitation is not visualized. No aortic stenosis is present. Aortic valve mean gradient measures 4.0 mmHg. Aortic valve peak gradient measures 8.6 mmHg. Aortic valve area, by VTI measures 1.41 cm. Pulmonic Valve: The pulmonic valve was not well visualized. Pulmonic valve regurgitation is trivial. No evidence of pulmonic stenosis. Aorta: The aortic root is normal in size and structure. Venous: The inferior vena cava is normal in size with greater than 50% respiratory variability, suggesting right atrial pressure of 3 mmHg. IAS/Shunts: No atrial level shunt detected by color flow Doppler.  LEFT VENTRICLE PLAX 2D LVIDd:         4.10 cm   Diastology LVIDs:         2.20 cm   LV e' medial:    6.64 cm/s LV PW:         0.80 cm   LV E/e' medial:  12.3 LV IVS:        0.80 cm   LV e' lateral:   10.00 cm/s LVOT diam:     1.90 cm   LV E/e' lateral: 8.2 LV SV:         47 LV SV Index:   27 LVOT Area:     2.84 cm  RIGHT VENTRICLE             IVC RV S prime:     14.80 cm/s  IVC diam: 1.70 cm TAPSE (M-mode): 2.7 cm LEFT ATRIUM             Index        RIGHT ATRIUM           Index LA diam:        3.70 cm 2.13 cm/m   RA Area:     13.10 cm LA Vol (A2C):   66.9 ml 38.48 ml/m  RA Volume:   29.80 ml  17.14 ml/m LA Vol (A4C):   66.0 ml 37.96 ml/m LA Biplane Vol: 68.3 ml 39.28 ml/m  AORTIC VALVE AV Area (Vmax):    1.51 cm AV Area (Vmean):   1.62 cm AV Area (VTI):     1.41 cm AV Vmax:           147.02 cm/s AV Vmean:           91.163 cm/s AV VTI:            0.334 m AV Peak Grad:      8.6 mmHg AV Mean Grad:      4.0 mmHg LVOT Vmax:         78.50 cm/s LVOT Vmean:        52.100 cm/s LVOT VTI:          0.166 m LVOT/AV VTI ratio: 0.50  AORTA Ao Root diam: 2.70 cm MITRAL VALVE               TRICUSPID VALVE MV Area (PHT): 3.77 cm    TR Peak grad:   31.8 mmHg MV Decel Time: 201 msec    TR Vmax:        282.00 cm/s MV E velocity: 81.90 cm/s MV A velocity: 52.80 cm/s  SHUNTS MV E/A ratio:  1.55        Systemic VTI:  0.17 m                            Systemic Diam: 1.90 cm Carlyle Dolly MD Electronically signed by Carlyle Dolly MD Signature Date/Time: 02/14/2021/4:25:44 PM    Final     PHYSICAL EXAM General:  Patient is a well-developed, well-nourished female in no acute distress   NEURO:  Mental Status: AA&Ox3  Speech/Language: speech is without dysarthria or aphasia.  Naming, repetition, fluency, and comprehension intact.  Cranial Nerves:  II: PERRL. Visual fields full.  III, IV, VI: EOMI. Eyelids elevate symmetrically.  V: Sensation is intact to light touch and symmetrical to face.  VII: Smile is symmetrical. Able to puff cheeks and raise eyebrows.  VIII: hearing intact to voice. IX, X: Palate elevates symmetrically. Phonation is normal.  XB:JYNWGNFA shrug 5/5. XII: tongue is midline without fasciculations. Motor: 4/5 strength to RUE and RLE 5/5 strength to all LUE and LLE.  Tone: is normal and bulk is normal Sensation- Intact to light touch bilaterally. Extinction absent to light touch to DSS. Sharp/Dull   Vibration.   Coordination: Mild right-sided ataxia. No drift.  Gait- deferred   ASSESSMENT/PLAN [name] is a 70 y.o. female with history of HTN and stroke presenting with right sided weakness and headache. CT reveals left basal ganglia IPH.  Patient's blood pressure has been on the low end of normal.  Will hold her home antihypertensives at this time.  She states that she has recently lost about 40  pounds and may need reduced doses of these medications at discharge.  Awaiting stroke workup labs and echocardiogram.  IPH:  left basal ganglia etiology indeterminate possibly hemorrhagic infarct doubt primary hypertensive as blood pressure not significantly elevated.  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 No atrial shunt  EKG SR w PAC's TEE ordered  Recommend 30 day cardiac monitor post discharge LDL 62 HgbA1c 5.3  VTE prophylaxis - SCDs    Diet   Diet regular Room service appropriate? Yes; Fluid consistency: Thin   Aspirin 81 mg BID  prior to admission, now on aspirin 81 mg daily  Therapy recommendations:  pending Disposition:  pending  Hypertension Home meds:  atenolol/chlorthalidone 50/25 mg daily, lisinopril 2.5 mg daily Stable Goal SBP <140 Home meds being held with low end BP. Restart when appropriate Long-term BP goal normotensive  Hyperlipidemia Home meds:  simvastatin 20 mg daily,  LDL 122, goal < 70 Increased simvastatin to 40 mg Continue statin at discharge   Other Stroke Risk Factors Advanced Age >/= 82  Not a cigarette smoker but exposed to secondhand smoke, advised to avoid exposure Obesity, Body mass index is 29.26 kg/m., BMI >/= 30 associated with increased stroke risk, recommend weight loss, diet and exercise as appropriate- patient has recently been following a diet for weight loss and has lost 40 pounds  Other Active Problems History of stroke Old infarcts noted on MRI at right superior temporal gyrus and left cerebellum  Hospital day # 2  Rogers Blocker Harlin Rain, NP   I have personally obtained history,examined this patient, reviewed notes,  independently viewed imaging studies, participated in medical decision making and plan of care.ROS completed by me personally and pertinent positives fully documented  I have made any additions or clarifications directly to the above note. Agree with note above.  Patient presented with a left basal ganglia hemorrhage but MRI also shows previous hemorrhagic right occipital infarct as well as nonhemorrhagic left superior cerebellar and right superior temporal infarcts of embolic etiology.  She is likely not a good long-term anticoagulation candidate and hence will not do a loop recorder to check TEE for cardiac source of embolism and 30-day heart monitor after discharge.  Start aspirin 81 mg daily and aggressive risk factor modification.  Likely discharge home tomorrow after TEE.  Greater than 50% time during this 25-minute visit was spent in counseling and coordination of care and discussion with patient and care team and answering questions.  Antony Contras, MD Medical Director [facility] Pager: [phone] 02/15/2021 3:18 PM  To contact Stroke Continuity provider, please refer to [email or web address] After hours, contact General Neurology

## 2021-02-15 NOTE — Telephone Encounter (Signed)
Pt has been admitted to the hospital.

## 2021-02-16 ENCOUNTER — Inpatient Hospital Stay (HOSPITAL_COMMUNITY): Payer: Medicare Other

## 2021-02-16 ENCOUNTER — Encounter (HOSPITAL_COMMUNITY)
Admission: EM | Disposition: A | Discharge status: Home Health | Payer: Self-pay | Source: Home / Self Care | Attending: Neurology

## 2021-02-16 ENCOUNTER — Inpatient Hospital Stay (HOSPITAL_COMMUNITY): Payer: Medicare Other | Admitting: Certified Registered Nurse Anesthetist

## 2021-02-16 DIAGNOSIS — I639 Cerebral infarction, unspecified: Secondary | ICD-10-CM

## 2021-02-16 DIAGNOSIS — I610 Nontraumatic intracerebral hemorrhage in hemisphere, subcortical: Secondary | ICD-10-CM | POA: Diagnosis not present

## 2021-02-16 HISTORY — PX: TEE WITHOUT CARDIOVERSION: SHX5443

## 2021-02-16 HISTORY — PX: BUBBLE STUDY: SHX6837

## 2021-02-16 SURGERY — ECHOCARDIOGRAM, TRANSESOPHAGEAL
Anesthesia: Monitor Anesthesia Care

## 2021-02-16 MED ORDER — SODIUM CHLORIDE 0.9 % IV SOLN: INTRAVENOUS | Status: DC | PRN

## 2021-02-16 MED ORDER — ASPIRIN 81 MG PO TBEC: 81.0000 mg | DELAYED_RELEASE_TABLET | Freq: Every day | ORAL | 11 refills | Status: DC

## 2021-02-16 MED ORDER — PHENYLEPHRINE 40 MCG/ML (10ML) SYRINGE FOR IV PUSH (FOR BLOOD PRESSURE SUPPORT)
PREFILLED_SYRINGE | INTRAVENOUS | Status: DC | PRN
  Administered 2021-02-16: 120 ug via INTRAVENOUS
  Administered 2021-02-16: 40 ug via INTRAVENOUS

## 2021-02-16 MED ORDER — PROPOFOL 10 MG/ML IV BOLUS
INTRAVENOUS | Status: DC | PRN
  Administered 2021-02-16: 40 mg via INTRAVENOUS

## 2021-02-16 MED ORDER — PROPOFOL 500 MG/50ML IV EMUL
INTRAVENOUS | Status: DC | PRN
  Administered 2021-02-16: 150 ug/kg/min via INTRAVENOUS

## 2021-02-16 MED ORDER — LIDOCAINE 2% (20 MG/ML) 5 ML SYRINGE
INTRAMUSCULAR | Status: DC | PRN
  Administered 2021-02-16: 50 mg via INTRAVENOUS

## 2021-02-16 NOTE — CV Procedure (Signed)
    Transesophageal Echocardiogram Note  ELYNN PATTESON [id number] [birth year]  Procedure: Transesophageal Echocardiogram Indications: stroke   Procedure Details Consent: Obtained Time Out: Verified patient identification, verified procedure, site/side was marked, verified correct patient position, special equipment/implants available, Radiology Safety Procedures followed,  medications/allergies/relevent history reviewed, required imaging and test results available.  Performed  Medications:  During this procedure the patient is administered Lidocaine 50 mg IV followed by propofol drip 148 mg IV for sedation.  The patient's heart rate, blood pressure, and oxygen saturation are monitored continuously during the procedure. The period of conscious sedation is 30  minutes, of which I was present face-to-face 100% of this time.  Left Ventrical:  normal LV function .   Mitral Valve: trace MR   Aortic Valve: normal AV   Tricuspid Valve: mild TR   Pulmonic Valve: normal PV   Left Atrium/ Left atrial appendage: no thrombi in the LAA   Atrial septum: no evidence of PFI   Aorta: mild - moderate calcification    Complications: No apparent complications Patient did tolerate procedure well.   Thayer Headings, Brooke Bonito., MD, [facility] 02/16/2021, 10:30 AM

## 2021-02-16 NOTE — Discharge Summary (Addendum)
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 SPOKE WITH CARDIOLOGY AND THEIR OFFICE WILL SEND THE MONITOR TO THE PATIENT AT HOME.   LABORATORY STUDIES CBC    Component Value Date/Time   WBC 9.5 02/13/2021 1543   RBC 4.49 02/13/2021 1543   HGB 12.7 02/13/2021 1543   HCT 40.8 02/13/2021 1543   PLT 212 02/13/2021 1543   MCV 90.9 02/13/2021 1543   MCH 28.3 02/13/2021 1543   MCHC 31.1 02/13/2021 1543   RDW 14.6 02/13/2021 1543   LYMPHSABS 2.3 02/13/2021 1543   MONOABS 0.8 02/13/2021 1543   EOSABS 0.3 02/13/2021 1543   BASOSABS 0.0 02/13/2021 1543   CMP    Component Value Date/Time   NA 139 02/13/2021 1543   K 3.7 02/13/2021 1543   CL 104 02/13/2021 1543   CO2 28 02/13/2021 1543   GLUCOSE 93 02/13/2021 1543   BUN 17 02/13/2021 1543   CREATININE 0.73 02/13/2021 1543   CREATININE 0.93 12/08/2015 1117   CALCIUM 9.9 02/13/2021 1543   PROT 7.4 02/13/2021 1543   ALBUMIN 4.0 02/13/2021 1543   AST 30 02/13/2021 1543   ALT 19 02/13/2021 1543   ALKPHOS 58 02/13/2021 1543   BILITOT 0.4 02/13/2021 1543   GFRNONAA >60 02/13/2021 1543   GFRAA >60 12/07/2018 1403   COAGS Lab Results  Component Value Date   INR 1.0 02/13/2021   INR 1.08 03/14/2017   INR 1.10 09/10/2014   Lipid Panel    Component Value Date/Time   CHOL 178 02/15/2021 0218   TRIG 93 02/15/2021 0218   HDL 37 (L) 02/15/2021 0218   CHOLHDL 4.8 02/15/2021 0218   VLDL 19 02/15/2021 0218   LDLCALC 122 (H)  02/15/2021 0218   HgbA1C  Lab Results  Component Value Date   HGBA1C 5.3 02/15/2021   Urinalysis    Component Value Date/Time   COLORURINE YELLOW 02/13/2021 1750   APPEARANCEUR CLEAR 02/13/2021 1750   LABSPEC 1.006 02/13/2021 1750   PHURINE 7.0 02/13/2021 1750   GLUCOSEU NEGATIVE 02/13/2021 1750   HGBUR NEGATIVE 02/13/2021 1750   BILIRUBINUR NEGATIVE 02/13/2021 1750   KETONESUR NEGATIVE 02/13/2021 1750   PROTEINUR NEGATIVE 02/13/2021 1750   UROBILINOGEN 0.2 11/11/2013 1536   NITRITE NEGATIVE 02/13/2021 1750   LEUKOCYTESUR NEGATIVE 02/13/2021 1750   Urine Drug Screen     Component Value Date/Time   LABOPIA NONE DETECTED 02/13/2021 1750   COCAINSCRNUR NONE DETECTED 02/13/2021 1750   LABBENZ NONE DETECTED 02/13/2021 1750   AMPHETMU NONE DETECTED 02/13/2021 1750   THCU NONE DETECTED 02/13/2021 1750   LABBARB NONE DETECTED 02/13/2021 1750    Alcohol Level No results found for: ETH   SIGNIFICANT DIAGNOSTIC STUDIES CT HEAD WO CONTRAST  Result Date: 02/13/2021 CLINICAL DATA:  Concern for bleed. EXAM: CT HEAD WITHOUT CONTRAST TECHNIQUE: Contiguous axial images were obtained from the base of the skull through the vertex without intravenous contrast. COMPARISON:  None. FINDINGS: Brain: Intraparenchymal hemorrhage in the left basal  ganglia along the external capsule measuring 2.4 x 1.0 x 1.8 cm on images 16/2 . There is mild adjacent edema without significant mass effect. Remote lacunar type infarct in the left cerebellar hemisphere. No hydrocephalus. No extra-axial fluid collection. No significant parenchymal volume loss. Vascular: No hyperdense vessel. Atherosclerotic calcifications of the internal carotid arteries at skull base. Skull: Hyperostosis interna, normal variant. Negative for fracture or focal lesion. Sinuses/Orbits: Visualized portions of the paranasal sinuses and mastoid air cells are predominantly clear. Orbits are unremarkable. Other: None. IMPRESSION: 1. 2.4 x 1.0 x 1.8  cm intraparenchymal hemorrhage in the left basal ganglia along the external capsule with mild adjacent edema but without significant mass effect. 2. Remote lacunar type infarct in the left cerebellar hemisphere. These results were called by telephone at the time of interpretation on 02/13/2021 at 4:01 pm to provider [facility] , who verbally acknowledged these results. Electronically Signed   By: Dahlia Bailiff M.D.   On: 02/13/2021 16:04   MR BRAIN WO CONTRAST  Result Date: 02/14/2021 CLINICAL DATA:  70 year old female with left lentiform acute hemorrhage on head CT yesterday. EXAM: MRI HEAD WITHOUT CONTRAST TECHNIQUE: Multiplanar, multiecho pulse sequences of the brain and surrounding structures were obtained without intravenous contrast. COMPARISON:  Head CT 02/13/2021. FINDINGS: Brain: Biconvex mostly T2 hypointense, T1 I so-to hyperintense intra-axial hemorrhage re-demonstrated encompassing 23 by 9 by 18 mm (AP by transverse by CC), for an estimated blood volume of 2 mL. Surrounding edema. No significant regional mass effect. Susceptibility on DWI with no larger area of restricted diffusion. Chronic area of hemorrhage and encephalomalacia in the right occipital pole (series 14, image 19). And there are also chronic left cerebellar SCA territory infarcts. a and there is a subtle area of right superior temporal gyrus encephalomalacia best seen on series 11, image 11. But no other chronic cerebral blood products are identified. Elsewhere the deep gray matter nuclei appear normal. No restricted diffusion. No midline shift, mass effect, evidence of mass lesion, ventriculomegaly, extra-axial collection. Cervicomedullary junction and pituitary are within normal limits. No other cortical encephalomalacia identified. Mild for age scattered small white matter T2 and FLAIR hyperintense foci otherwise. Vascular: Major intracranial vascular flow voids are preserved. The left vertebral artery appears mildly dominant.  Skull and upper cervical spine: Negative. Visualized bone marrow signal is within normal limits. Sinuses/Orbits: Postoperative changes to both globes, otherwise negative. Other: Trace left mastoid air cell fluid. Significance is doubtful, negative visible nasopharynx and grossly normal visible other internal auditory structures. IMPRESSION: 1. Biconvex hemorrhage at the left lentiform nucleus with estimated blood volume of 2 mL. Surrounding edema but no significant mass effect or other complicating features. 2. Chronic hemorrhage in encephalomalacia at the right occipital pole. But no other chronic cerebral blood products. However, chronic infarcts also at the right superior temporal gyrus, left cerebellum SCA territory. 3. No other acute intracranial abnormality. Electronically Signed   By: Genevie Ann M.D.   On: 02/14/2021 10:34   ECHOCARDIOGRAM COMPLETE  Result Date: 02/14/2021    ECHOCARDIOGRAM REPORT   Patient Name:   DULCIE GAMMON Date of Exam: 02/14/2021 Medical Rec #:  [id number]        Height:       62.0 in Accession #:    [id number]       Weight:       160.0 lb Date of Birth:  [birth year]        BSA:          1.739 m  Patient Age:    61 years         BP:           102/64 mmHg Patient Gender: F                HR:           74 bpm. Exam Location:  Inpatient Procedure: 2D Echo Indications:    stroke  History:        Patient has no prior history of Echocardiogram examinations.                 Risk Factors:Hypertension and Dyslipidemia.  Sonographer:    [name] RDCS Referring Phys: [phone] MCNEILL P Dickinson  1. Left ventricular ejection fraction, by estimation, is 65 to 70%. The left ventricle has normal function. The left ventricle has no regional wall motion abnormalities. Left ventricular diastolic parameters were normal.  2. Right ventricular systolic function is normal. The right ventricular size is normal. There is normal pulmonary artery systolic pressure.  3. Left atrial size was  mildly dilated.  4. The mitral valve is normal in structure. Mild mitral valve regurgitation. No evidence of mitral stenosis.  5. The aortic valve is tricuspid. Aortic valve regurgitation is not visualized. No aortic stenosis is present.  6. The inferior vena cava is normal in size with greater than 50% respiratory variability, suggesting right atrial pressure of 3 mmHg. FINDINGS  Left Ventricle: Left ventricular ejection fraction, by estimation, is 65 to 70%. The left ventricle has normal function. The left ventricle has no regional wall motion abnormalities. The left ventricular internal cavity size was normal in size. There is  no left ventricular hypertrophy. Left ventricular diastolic parameters were normal. Right Ventricle: The right ventricular size is normal. No increase in right ventricular wall thickness. Right ventricular systolic function is normal. There is normal pulmonary artery systolic pressure. The tricuspid regurgitant velocity is 2.82 m/s, and  with an assumed right atrial pressure of 3 mmHg, the estimated right ventricular systolic pressure is 12.7 mmHg. Left Atrium: Left atrial size was mildly dilated. Right Atrium: Right atrial size was normal in size. Pericardium: There is no evidence of pericardial effusion. Mitral Valve: The mitral valve is normal in structure. Mild mitral valve regurgitation. No evidence of mitral valve stenosis. Tricuspid Valve: The tricuspid valve is normal in structure. Tricuspid valve regurgitation is mild . No evidence of tricuspid stenosis. Aortic Valve: The aortic valve is tricuspid. Aortic valve regurgitation is not visualized. No aortic stenosis is present. Aortic valve mean gradient measures 4.0 mmHg. Aortic valve peak gradient measures 8.6 mmHg. Aortic valve area, by VTI measures 1.41 cm. Pulmonic Valve: The pulmonic valve was not well visualized. Pulmonic valve regurgitation is trivial. No evidence of pulmonic stenosis. Aorta: The aortic root is normal in size  and structure. Venous: The inferior vena cava is normal in size with greater than 50% respiratory variability, suggesting right atrial pressure of 3 mmHg. IAS/Shunts: No atrial level shunt detected by color flow Doppler.  LEFT VENTRICLE PLAX 2D LVIDd:         4.10 cm   Diastology LVIDs:         2.20 cm   LV e' medial:    6.64 cm/s LV PW:         0.80 cm   LV E/e' medial:  12.3 LV IVS:        0.80 cm   LV e' lateral:   10.00 cm/s LVOT diam:  1.90 cm   LV E/e' lateral: 8.2 LV SV:         47 LV SV Index:   27 LVOT Area:     2.84 cm  RIGHT VENTRICLE             IVC RV S prime:     14.80 cm/s  IVC diam: 1.70 cm TAPSE (M-mode): 2.7 cm LEFT ATRIUM             Index        RIGHT ATRIUM           Index LA diam:        3.70 cm 2.13 cm/m   RA Area:     13.10 cm LA Vol (A2C):   66.9 ml 38.48 ml/m  RA Volume:   29.80 ml  17.14 ml/m LA Vol (A4C):   66.0 ml 37.96 ml/m LA Biplane Vol: 68.3 ml 39.28 ml/m  AORTIC VALVE AV Area (Vmax):    1.51 cm AV Area (Vmean):   1.62 cm AV Area (VTI):     1.41 cm AV Vmax:           147.02 cm/s AV Vmean:          91.163 cm/s AV VTI:            0.334 m AV Peak Grad:      8.6 mmHg AV Mean Grad:      4.0 mmHg LVOT Vmax:         78.50 cm/s LVOT Vmean:        52.100 cm/s LVOT VTI:          0.166 m LVOT/AV VTI ratio: 0.50  AORTA Ao Root diam: 2.70 cm MITRAL VALVE               TRICUSPID VALVE MV Area (PHT): 3.77 cm    TR Peak grad:   31.8 mmHg MV Decel Time: 201 msec    TR Vmax:        282.00 cm/s MV E velocity: 81.90 cm/s MV A velocity: 52.80 cm/s  SHUNTS MV E/A ratio:  1.55        Systemic VTI:  0.17 m                            Systemic Diam: 1.90 cm Carlyle Dolly MD Electronically signed by Carlyle Dolly MD Signature Date/Time: 02/14/2021/4:25:44 PM    Final       HISTORY OF PRESENT ILLNESS  [name] is a 70 y.o. female with history of HTN and stroke presenting with right sided weakness and headache. CT reveals left basal ganglia IPH.  Patient's blood pressure has been  on the low end of normal.  Will hold her home antihypertensives at this time.  She states that she has recently lost about 40 pounds and may need reduced doses of these medications at discharge.    HOSPITAL COURSE    IPH:  left basal ganglia etiology indeterminate possibly hemorrhagic infarct doubt primary hypertensive as blood pressure not significantly elevated.  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 No atrial shunt  EKG SR w PAC's TEE  ordered  Recommend 30 day cardiac monitor post discharge. Notified cardiology app staff.  LDL 62 HgbA1c 5.3 VTE prophylaxis - SCDs Aspirin 81 mg BID  prior to admission, now on aspirin 81 mg daily  Therapy recommendations:  pending Disposition:  pending   Hypertension Home meds:  atenolol/chlorthalidone 50/25 mg daily, lisinopril 2.5 mg daily Stable Goal SBP <140 Home meds being held with low end BP. Restart when appropriate Long-term BP goal normotensive   Hyperlipidemia Home meds:  simvastatin 20 mg daily,  LDL 122, goal < 70 Increased simvastatin to 40 mg Continue statin at discharge     Other Stroke Risk Factors Advanced Age >/= 68  Not a cigarette smoker but exposed to secondhand smoke, advised to avoid exposure Obesity, Body mass index is 29.26 kg/m., BMI >/= 30 associated with increased stroke risk, recommend weight loss, diet and exercise as appropriate- patient has recently been following a diet for weight loss and has lost 40 pounds   Other Active Problems History of stroke Old infarcts noted on MRI at right superior temporal gyrus and left cerebellum   RN Pressure Injury Documentation:     DISCHARGE EXAM Blood pressure  127/88, pulse 63, temperature 97.6 F (36.4 C), temperature source Oral, resp. rate (!) 23, height 5' 2"  (1.575 m), weight 72.6 kg, last menstrual period 02/09/2005, SpO2 100 %.  NEURO:  Mental Status: AA&Ox3  Speech/Language: speech is without dysarthria or aphasia.  Naming, repetition, fluency, and comprehension intact.   Cranial Nerves:  II: PERRL. Visual fields full.  III, IV, VI: EOMI. Eyelids elevate symmetrically.  V: Sensation is intact to light touch and symmetrical to face.  VII: Smile is symmetrical. Able to puff cheeks and raise eyebrows.  VIII: hearing intact to voice. IX, X: Palate elevates symmetrically. Phonation is normal.  OZ:YYQMGNOI shrug 5/5. XII: tongue is midline without fasciculations. Motor: 4/5 strength to RUE and RLE 5/5 strength to all LUE and LLE.  Tone: is normal and bulk is normal Sensation- Intact to light touch bilaterally. Extinction absent to light touch to DSS. Sharp/Dull   Vibration.   Coordination: Mild right-sided ataxia. No drift.  Gait- deferred  Discharge Diet       Diet   Diet regular Room service appropriate? Yes; Fluid consistency: Thin   liquids  DISCHARGE PLAN Disposition:  home aspirin 81 mg daily for secondary stroke prevention  Ongoing stroke risk factor control by Primary Care Physician at time of discharge Follow-up PCP Hoyt Koch, MD in 2 weeks. Follow-up in [facility] in 4 weeks, office to schedule an appointment.     45 minutes were spent preparing discharge. I have personally obtained history,examined this patient, reviewed notes, independently viewed imaging studies, participated in medical decision making and plan of care.ROS completed by me personally and pertinent positives fully documented  I have made any additions or clarifications directly to the above note. Agree with note above.    Antony Contras, MD Medical Director [facility] Pager:  [phone] 02/16/2021 5:13 PM

## 2021-02-16 NOTE — Transfer of Care (Signed)
Immediate Anesthesia Transfer of Care Note  Patient: Durene Fruits  Procedure(s) Performed: TRANSESOPHAGEAL ECHOCARDIOGRAM (TEE) BUBBLE STUDY  Patient Location: PACU  Anesthesia Type:MAC  Level of Consciousness: drowsy  Airway & Oxygen Therapy: Patient Spontanous Breathing and Patient connected to nasal cannula oxygen  Post-op Assessment: Report given to RN and Post -op Vital signs reviewed and stable  Post vital signs: Reviewed and stable  Last Vitals:  Vitals Value Taken Time  BP 111/57 02/16/21 1038  Temp    Pulse 59 02/16/21 1038  Resp 15 02/16/21 1038  SpO2 100 % 02/16/21 1038  Vitals shown include unvalidated device data.  Last Pain:  Vitals:   02/16/21 0939  TempSrc: Temporal  PainSc: 0-No pain      Patients Stated Pain Goal: 0 (00/92/33 0076)  Complications: No notable events documented.

## 2021-02-16 NOTE — Progress Notes (Signed)
Pacu RN Report to floor given  Gave report to Samer RN. [phone]. Discussed surgery, meds given in OR and Pacu, VS, IV fluids given, EBL, urine output, pain and other pertinent information. Also discussed if pt had any family or friends here or belongings with them.   Brought Tele box and wires w/ pt back to room. Glasses on pt.   Pt exits my care.

## 2021-02-16 NOTE — TOC CAGE-AID Note (Signed)
Transition of Care [facility]) - CAGE-AID Screening   Patient Details  Name: [name] MRN: [id number] Date of Birth: [birth year]  Transition of Care [facility]) CM/SW Contact:    Keighley Deckman C Tarpley-Carter, LCSWA Phone Number: 02/16/2021, 2:58 PM   Clinical Narrative: Pt participated in [place].  Pt stated she does not use substance or ETOH.  Pt was not offered resources, due to no usage of substance or ETOH.     Callan Norden Tarpley-Carter, MSW, LCSW-A Pronouns:  She/Her/Hers [facility] Health[facility]itions of Care Clinical Social Worker Direct Number:  [phone] [email or web address]   CAGE-AID Screening:    Have You Ever Felt You Ought to Cut Down on Your Drinking or Drug Use?: No Have People Annoyed You By [employer] Your Drinking Or Drug Use?: No Have You Felt Bad Or Guilty About Your Drinking Or Drug Use?: No Have You Ever Had a Drink or Used Drugs First Thing In The Morning to Steady Your Nerves or to Get Rid of a Hangover?: No CAGE-AID Score: 0  Substance Abuse Education Offered: No

## 2021-02-16 NOTE — Anesthesia Procedure Notes (Signed)
Procedure Name: MAC Date/Time: 02/16/2021 10:48 AM Performed by: Leonor Liv, CRNA Pre-anesthesia Checklist: Patient identified, Emergency Drugs available, Suction available, Patient being monitored and Timeout performed Patient Re-evaluated:Patient Re-evaluated prior to induction Oxygen Delivery Method: Nasal cannula Placement Confirmation: positive ETCO2 Dental Injury: Teeth and Oropharynx as per pre-operative assessment

## 2021-02-16 NOTE — Anesthesia Preprocedure Evaluation (Addendum)
Anesthesia Evaluation  Patient identified by MRN, date of birth, ID band Patient awake    Reviewed: Allergy & Precautions, NPO status , Patient's Chart, lab work & pertinent test results  History of Anesthesia Complications (+) PONV and history of anesthetic complications (only sometimes PONV)  Airway Mallampati: III  TM Distance: >3 FB Neck ROM: Full    Dental  (+) Teeth Intact, Dental Advisory Given, Poor Dentition   Pulmonary neg pulmonary ROS,    Pulmonary exam normal breath sounds clear to auscultation       Cardiovascular hypertension, Pt. on medications Normal cardiovascular exam+ Valvular Problems/Murmurs (mild MR) MR  Rhythm:Regular Rate:Normal  TTE 02/14/21: 1. Left ventricular ejection fraction, by estimation, is 65 to 70%. The  left ventricle has normal function. The left ventricle has no regional  wall motion abnormalities. Left ventricular diastolic parameters were  normal.  2. Right ventricular systolic function is normal. The right ventricular  size is normal. There is normal pulmonary artery systolic pressure.  3. Left atrial size was mildly dilated.  4. The mitral valve is normal in structure. Mild mitral valve  regurgitation. No evidence of mitral stenosis.  5. The aortic valve is tricuspid. Aortic valve regurgitation is not  visualized. No aortic stenosis is present.  6. The inferior vena cava is normal in size with greater than 50%  respiratory variability, suggesting right atrial pressure of 3 mmHg.    Neuro/Psych  Headaches, TIA (last week)CVA (L weakness, CVA in 1998 nd this admission- some balance issues currently), Residual Symptoms negative psych ROS   GI/Hepatic Neg liver ROS, hiatal hernia, GERD  Controlled,  Endo/Other  negative endocrine ROS  Renal/GU negative Renal ROS  Female GU complaint     Musculoskeletal  (+) Arthritis , Osteoarthritis,    Abdominal   Peds negative  pediatric ROS (+)  Hematology negative hematology ROS (+) hct 40.8   Anesthesia Other Findings   Reproductive/Obstetrics negative OB ROS                            Anesthesia Physical Anesthesia Plan  ASA: 3  Anesthesia Plan: MAC   Post-op Pain Management:    Induction:   PONV Risk Score and Plan: 2 and Propofol infusion and TIVA  Airway Management Planned: Natural Airway and Simple Face Mask  Additional Equipment: None  Intra-op Plan:   Post-operative Plan:   Informed Consent: I have reviewed the patients History and Physical, chart, labs and discussed the procedure including the risks, benefits and alternatives for the proposed anesthesia with the patient or authorized representative who has indicated his/her understanding and acceptance.       Plan Discussed with: CRNA  Anesthesia Plan Comments:         Anesthesia Quick Evaluation

## 2021-02-16 NOTE — Interval H&P Note (Signed)
History and Physical Interval Note:  02/16/2021 9:51 AM  [name]  has presented today for surgery, with the diagnosis of stroke.  The various methods of treatment have been discussed with the patient and family. After consideration of risks, benefits and other options for treatment, the patient has consented to  Procedure(s): TRANSESOPHAGEAL ECHOCARDIOGRAM (TEE) (N/A) as a surgical intervention.  The patient's history has been reviewed, patient examined, no change in status, stable for surgery.  I have reviewed the patient's chart and labs.  Questions were answered to the patient's satisfaction.     Mertie Moores

## 2021-02-16 NOTE — Plan of Care (Signed)
  Problem: Education: Goal: Knowledge of General Education information will improve Description: Including pain rating scale, medication(s)/side effects and non-pharmacologic comfort measures Outcome: Completed/Met   Problem: Health Behavior/Discharge Planning: Goal: Ability to manage health-related needs will improve Outcome: Completed/Met   Problem: Clinical Measurements: Goal: Ability to maintain clinical measurements within normal limits will improve Outcome: Completed/Met   Problem: Clinical Measurements: Goal: Will remain free from infection Outcome: Completed/Met    Problem: Activity: Goal: Risk for activity intolerance will decrease Outcome: Completed/Met   Problem: Nutrition: Goal: Adequate nutrition will be maintained Outcome: Completed/Met

## 2021-02-16 NOTE — Care Management Important Message (Signed)
Important Message  Patient Details  Name: [name] MRN: [id number] Date of Birth: 07-Sep-1950   Medicare Important Message Given:  Yes     Carmelina Balducci Montine Circle 02/16/2021, 3:03 PM

## 2021-02-16 NOTE — Plan of Care (Signed)
  Problem: Education: Goal: Knowledge of General Education information will improve Description: Including pain rating scale, medication(s)/side effects and non-pharmacologic comfort measures Outcome: Progressing   Problem: Health Behavior/Discharge Planning: Goal: Ability to manage health-related needs will improve Outcome: Progressing   Problem: Clinical Measurements: Goal: Ability to maintain clinical measurements within normal limits will improve Outcome: Progressing Goal: Will remain free from infection Outcome: Progressing Goal: Diagnostic test results will improve Outcome: Progressing Goal: Respiratory complications will improve Outcome: Progressing Goal: Cardiovascular complication will be avoided Outcome: Progressing   Problem: Activity: Goal: Risk for activity intolerance will decrease Outcome: Progressing   Problem: Nutrition: Goal: Adequate nutrition will be maintained Outcome: Progressing   Problem: Coping: Goal: Level of anxiety will decrease Outcome: Progressing   Problem: Elimination: Goal: Will not experience complications related to bowel motility Outcome: Progressing Goal: Will not experience complications related to urinary retention Outcome: Progressing   Problem: Pain Managment: Goal: General experience of comfort will improve Outcome: Progressing   Problem: Safety: Goal: Ability to remain free from injury will improve Outcome: Progressing   Problem: Skin Integrity: Goal: Risk for impaired skin integrity will decrease Outcome: Progressing   Problem: Education: Goal: Knowledge of disease or condition will improve Outcome: Progressing Goal: Knowledge of secondary prevention will improve (SELECT ALL) Outcome: Progressing Goal: Knowledge of patient specific risk factors will improve (INDIVIDUALIZE FOR PATIENT) Outcome: Progressing   Problem: Health Behavior/Discharge Planning: Goal: Ability to manage health-related needs will  improve Outcome: Progressing   Problem: Self-Care: Goal: Ability to participate in self-care as condition permits will improve Outcome: Progressing

## 2021-02-16 NOTE — Progress Notes (Signed)
  Echocardiogram Echocardiogram Transesophageal has been performed.  [name] 02/16/2021, 10:31 AM

## 2021-02-16 NOTE — Progress Notes (Signed)
Occupational Therapy Treatment Patient Details Name: [name] MRN: [id number] DOB: Apr 16, 1950 Today's Date: 02/16/2021   History of present illness Pt is a 70 yo female who presents with unsteadiness and mild R facial droop. 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   OT comments  [name] is progressing well with plans to d/c home today. Pt was able to complete upper and lower body dressing with mod I, and completed functional ambulation within the room with supervision for safety only. Pt demonstrated great stability and awareness of lines throughout. Spent increased time going over home safety and fall prevention strategies: sit to shower, have supervision A for all bathroom transfers, overhead lights on when OOB at night, no throw rugs, no clutter on floor, clear pathways, & supervision A with IADLs. Pt verbalized great understanding. Pt continues to benefit from OT.    Recommendations for follow up therapy are one component of a multi-disciplinary discharge planning process, led by the attending physician.  Recommendations may be updated based on patient status, additional functional criteria and insurance authorization.    Follow Up Recommendations  Home health OT    Assistance Recommended at Discharge Frequent or constant Supervision/Assistance        Precautions / Restrictions Precautions Precautions: Fall Restrictions Weight Bearing Restrictions: No       Mobility Bed Mobility Overal bed mobility: Independent                  Transfers Overall transfer level: Modified  independent Equipment used: None Transfers: Sit to/from Stand             General transfer comment: No unsteadiness or LOB noted to power-up to full stand. Pt with good awareness of lines throughout.     Balance Overall balance assessment: Needs assistance Sitting-balance support: Feet supported;No upper extremity supported Sitting balance-[name] Scale: Good     Standing balance support: No upper extremity supported Standing balance-[name] Scale: Good                             ADL either performed or assessed with clinical judgement   ADL Overall ADL's : Needs assistance/impaired                 Upper Body Dressing : Sitting;Modified independent   Lower Body Dressing: Modified independent;Sit to/from stand   Toilet Transfer: Supervision/safety;Ambulation           Functional mobility during ADLs: Supervision/safety General ADL Comments: pt mod I for dressing this session with use of compensatory techniques to decrase fall risk, supervision for ambulation    Extremity/Trunk Assessment Upper Extremity Assessment Upper Extremity Assessment: RUE deficits/detail RUE Deficits / Details: R shoulder flexion 4-/5 strength, biceps 4/5 strength. Equal grip. RUE Sensation: WNL RUE Coordination: WNL   Lower Extremity Assessment Lower Extremity Assessment: Defer to PT evaluation        Vision   Vision Assessment?: No apparent visual deficits          Cognition Arousal/Alertness: Awake/alert Behavior During Therapy: WFL for tasks assessed/performed Overall Cognitive Status: Within Functional  Limits for tasks assessed                        General Comments VSS on RA, educated pt on fall prevention strategies    Pertinent Vitals/ Pain       Pain Assessment: No/denies pain  Frequency  Min 2X/week        Progress Toward Goals  OT Goals(current goals can now be found in the care plan section)  Progress towards OT goals: Progressing  toward goals  Acute Rehab OT Goals OT Goal Formulation: With patient Time For Goal Achievement: 03/06/21 Potential to Achieve Goals: Good ADL Goals Additional ADL Goal #1: pt will be mod indep with all ADLs and self care transfers with use of [place] Discharge plan remains appropriate       AM-PAC OT "6 Clicks" Daily Activity     Outcome Measure   Help from another person eating meals?: None Help from another person taking care of personal grooming?: A Little Help from another person toileting, which includes using toliet, bedpan, or urinal?: A Little Help from another person bathing (including washing, rinsing, drying)?: A Little Help from another person to put on and taking off regular upper body clothing?: A Little Help from another person to put on and taking off regular lower body clothing?: A Little 6 Click Score: 19    End of Session    OT Visit Diagnosis: Unsteadiness on feet (R26.81)   Activity Tolerance Patient tolerated treatment well   Patient Left in chair;with call bell/phone within reach   Nurse Communication Mobility status        Time: 3710-6269 OT Time Calculation (min): 18 min  Charges: OT General Charges $OT Visit: 1 Visit OT Treatments $Self Care/Home Management : 8-22 mins   Gracieann Stannard A Lailynn [place] 02/16/2021, 2:35 PM

## 2021-02-16 NOTE — Anesthesia Postprocedure Evaluation (Signed)
Anesthesia Post Note  Patient: [name]  Procedure(s) Performed: TRANSESOPHAGEAL ECHOCARDIOGRAM (TEE) BUBBLE STUDY     Patient location during evaluation: PACU Anesthesia Type: MAC Level of consciousness: awake and alert Pain management: pain level controlled Vital Signs Assessment: post-procedure vital signs reviewed and stable Respiratory status: spontaneous breathing, nonlabored ventilation, respiratory function stable and patient connected to nasal cannula oxygen Cardiovascular status: stable and blood pressure returned to baseline Postop Assessment: no apparent nausea or vomiting Anesthetic complications: no   No notable events documented.  Last Vitals:  Vitals:   02/16/21 1110 02/16/21 1140  BP:  127/88  Pulse: 62 63  Resp: 14 (!) 23  Temp: (!) 36.3 C 36.4 C  SpO2: 100% 100%    Last Pain:  Vitals:   02/16/21 1140  TempSrc: Oral  PainSc:                  Tiajuana Amass

## 2021-02-17 ENCOUNTER — Telehealth: Payer: Self-pay

## 2021-02-17 ENCOUNTER — Other Ambulatory Visit: Payer: Self-pay | Admitting: Medical

## 2021-02-17 ENCOUNTER — Encounter (HOSPITAL_COMMUNITY): Payer: Self-pay | Admitting: Cardiovascular Disease

## 2021-02-17 DIAGNOSIS — I639 Cerebral infarction, unspecified: Secondary | ICD-10-CM

## 2021-02-17 NOTE — Telephone Encounter (Signed)
Transition Care Management Follow-up Telephone Call Date of discharge and from where: [name] 02/16/21 Diagnosis: intracerebral hemorrhage, stroke How have you been since you were released from the hospital? Stable, has some right sided weakness, but still able to get around with a cane Any questions or concerns? No  Items Reviewed: Did the pt receive and understand the discharge instructions provided? Yes  Medications obtained and verified? Yes  Other? No  Any new allergies since your discharge? No  Dietary orders reviewed? Yes Do you have support at home? Yes   Home Care and Equipment/Supplies: Were home health services ordered? No - outpatient PT was ordered. If so, what is the name of the agency? na  Has the agency set up a time to come to the patient's home? not applicable Were any new equipment or medical supplies ordered?  No What is the name of the medical supply agency? N/a Were you able to get the supplies/equipment? not applicable Do you have any questions related to the use of the equipment or supplies? No  Functional Questionnaire: (I = Independent and D = Dependent) ADLs: I - with light assistance  Bathing/Dressing- I - with light assistance  Meal Prep- I - with assistance  Eating- I  Maintaining continence- I  Transferring/Ambulation- I with cane  Managing Meds- I - husband and/or daughter keep track just in case  Follow up appointments reviewed:  PCP Hospital f/u appt confirmed? Yes  Scheduled to see [place] on 02/23/21 @ 11. [facility] f/u appt confirmed? No  - is waiting for call from [facility] Are transportation arrangements needed? No  If their condition worsens, is the pt aware to call PCP or go to the Emergency Dept.? Yes Was the patient provided with contact information for the PCP's office or ED? Yes Was to pt encouraged to call back with questions or concerns? Yes

## 2021-02-17 NOTE — Progress Notes (Signed)
   Cardiology asked to arrange a 30 day monitor to evaluate etiology of CVA. Order place for Dr. Harrell Gave to read. Follow-up appointment has been arranged.   Abigail Butts, PA-C 02/17/21; 2:37 PM

## 2021-02-23 ENCOUNTER — Encounter: Payer: Self-pay | Admitting: Internal Medicine

## 2021-02-23 ENCOUNTER — Ambulatory Visit (INDEPENDENT_AMBULATORY_CARE_PROVIDER_SITE_OTHER): Payer: Medicare Other | Admitting: Internal Medicine

## 2021-02-23 ENCOUNTER — Other Ambulatory Visit: Payer: Self-pay

## 2021-02-23 DIAGNOSIS — I10 Essential (primary) hypertension: Secondary | ICD-10-CM | POA: Diagnosis not present

## 2021-02-23 DIAGNOSIS — E782 Mixed hyperlipidemia: Secondary | ICD-10-CM

## 2021-02-23 DIAGNOSIS — I610 Nontraumatic intracerebral hemorrhage in hemisphere, subcortical: Secondary | ICD-10-CM

## 2021-02-23 MED ORDER — LISINOPRIL 2.5 MG PO TABS: 2.5000 mg | ORAL_TABLET | Freq: Every day | ORAL | 3 refills | Status: DC

## 2021-02-23 MED ORDER — ATENOLOL-CHLORTHALIDONE 50-25 MG PO TABS: 1.0000 | ORAL_TABLET | Freq: Every day | ORAL | 3 refills | Status: DC

## 2021-02-23 MED ORDER — SIMVASTATIN 20 MG PO TABS: 20.0000 mg | ORAL_TABLET | Freq: Every day | ORAL | 3 refills | Status: DC

## 2021-02-23 NOTE — Patient Instructions (Signed)
We do not need any labs today.

## 2021-02-23 NOTE — Assessment & Plan Note (Signed)
BP at goal of <130/80. She is taking atenolol/hctz 50/25 mg daily and lisinopril 2.5 mg daily. Recent labs from hospital reviewed and no changes needed.

## 2021-02-23 NOTE — Progress Notes (Signed)
   Subjective:   Patient ID: [name], female    DOB: [birth year], 70 y.o.   MRN: [id number]  HPI The patient is a 70 YO female coming in for hospital discharge and follow up. She was at [facility] for stroke symptoms and intracerebral hemorrhage. She had appropriate workup and is still pending 30 day monitor. She is feeling mostly normal some clumsiness of the right hand overall improving. Denies chest pains or SOB or N/V. Appetite is normal.   PMH, Opp, social history reviewed and updated  Review of Systems  Constitutional: Negative.   HENT: Negative.    Eyes: Negative.   Respiratory:  Negative for cough, chest tightness and shortness of breath.   Cardiovascular:  Negative for chest pain, palpitations and leg swelling.  Gastrointestinal:  Negative for abdominal distention, abdominal pain, constipation, diarrhea, nausea and vomiting.  Musculoskeletal: Negative.   Skin: Negative.   Neurological: Negative.   Psychiatric/Behavioral: Negative.     Objective:  Physical Exam Constitutional:      Appearance: She is well-developed.  HENT:     Head: Normocephalic and atraumatic.  Cardiovascular:     Rate and Rhythm: Normal rate and regular rhythm.  Pulmonary:     Effort: Pulmonary effort is normal. No respiratory distress.     Breath sounds: Normal breath sounds. No wheezing or rales.  Abdominal:     General: Bowel sounds are normal. There is no distension.     Palpations: Abdomen is soft.     Tenderness: There is no abdominal tenderness. There is no rebound.  Musculoskeletal:     Cervical back: Normal range of motion.  Skin:    General: Skin is warm and dry.  Neurological:     Mental Status: She is alert and oriented to person, place, and time. Mental status is at baseline.     Cranial Nerves: No cranial nerve deficit.     Sensory: No sensory deficit.     Motor: No weakness.     Coordination: Coordination normal.    Vitals:   02/23/21 1102  BP: 110/70   Pulse: 70  Resp: 18  SpO2: 97%  Weight: 162 lb 6.4 oz (73.7 kg)  Height: 5' 2"  (1.575 m)    This visit occurred during the SARS-CoV-2 public health emergency.  Safety protocols were in place, including screening questions prior to the visit, additional usage of staff PPE, and extensive cleaning of exam room while observing appropriate contact time as indicated for disinfecting solutions.   Assessment & Plan:

## 2021-02-23 NOTE — Assessment & Plan Note (Addendum)
Follow up imaging in hospital with stability. Symptoms are improving and she has minimal symptoms at this time. Advised to go to PT/OT evaluation and see if any therapy is needed. Expectations reviewed and timeline for recovery variable most improvement in the first 6-12 months. Given that this is a recurrent stroke she will need to complete 30 day monitor. We talked about importance to rule out atrial fibrillation. Reviewed TEE results and there was no hole in the heart or abnormal connection detected.

## 2021-02-23 NOTE — Assessment & Plan Note (Signed)
LDL was above goal in the hospital and she will continue simvastatin 20 mg daily. Needed refill today.

## 2021-02-25 ENCOUNTER — Other Ambulatory Visit: Payer: Self-pay

## 2021-02-25 ENCOUNTER — Ambulatory Visit (INDEPENDENT_AMBULATORY_CARE_PROVIDER_SITE_OTHER): Payer: Medicare Other

## 2021-02-25 ENCOUNTER — Other Ambulatory Visit: Payer: Self-pay | Admitting: *Deleted

## 2021-02-25 ENCOUNTER — Ambulatory Visit: Payer: Medicare Other | Attending: Internal Medicine

## 2021-02-25 DIAGNOSIS — M6281 Muscle weakness (generalized): Secondary | ICD-10-CM | POA: Diagnosis not present

## 2021-02-25 DIAGNOSIS — I4891 Unspecified atrial fibrillation: Secondary | ICD-10-CM

## 2021-02-25 DIAGNOSIS — I639 Cerebral infarction, unspecified: Secondary | ICD-10-CM

## 2021-02-25 DIAGNOSIS — I491 Atrial premature depolarization: Secondary | ICD-10-CM | POA: Diagnosis not present

## 2021-02-25 DIAGNOSIS — R278 Other lack of coordination: Secondary | ICD-10-CM | POA: Diagnosis not present

## 2021-02-25 NOTE — Addendum Note (Signed)
Addended byValente David on: 02/25/2021 01:08 PM   Modules accepted: Orders
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
## 2021-02-26 ENCOUNTER — Telehealth: Payer: Self-pay | Admitting: *Deleted

## 2021-02-26 DIAGNOSIS — I639 Cerebral infarction, unspecified: Secondary | ICD-10-CM | POA: Diagnosis not present

## 2021-02-26 DIAGNOSIS — I4891 Unspecified atrial fibrillation: Secondary | ICD-10-CM | POA: Diagnosis not present

## 2021-02-26 NOTE — Chronic Care Management (AMB) (Signed)
  Chronic Care Management   Note  02/26/2021 Name: [name] MRN: [id number] DOB: 1950/08/14  [name] is a 70 y.o. year old female who is a primary care patient of Hoyt Koch, MD. I reached out to [name] by phone today in response to a referral sent by [name] PCP.  [name] was given information about Chronic Care Management services today including:  CCM service includes personalized support from designated clinical staff supervised by her physician, including individualized plan of care and coordination with other care providers 24/7 contact phone numbers for assistance for urgent and routine care needs. Service will only be billed when office clinical staff spend 20 minutes or more in a month to coordinate care. Only one practitioner may furnish and bill the service in a calendar month. The patient may stop CCM services at any time (effective at the end of the month) by phone call to the office staff. The patient is responsible for co-pay (up to 20% after annual deductible is met) if co-pay is required by the individual health plan.   Patient agreed to services and verbal consent obtained.   Follow up plan: Telephone appointment with care management team member scheduled for:03/12/21  [place]: [phone]

## 2021-02-26 NOTE — Therapy (Signed)
[place] MAIN [facility] SERVICES [address] [place], [place], [address] Phone: [phone]   Fax:  [phone]  Occupational Therapy Evaluation  Patient Details  Name: [name] MRN: [id number] Date of Birth: April 22, 1950 Referring Provider (OT): Dr. Antony Contras   Encounter Date: 02/25/2021   OT End of Session - 02/26/21 0824     Visit Number 1    Number of Visits 12    Date for OT Re-Evaluation 04/07/21    OT Start Time 1300    OT Stop Time 1356    OT Time Calculation (min) 56 min    Activity Tolerance Patient tolerated treatment well    Behavior During Therapy [facility] for tasks assessed/performed             Past Medical History:  Diagnosis Date   Arthritis    back, fingers with joint pain and swelling.  chronic back pain   Cataract    Chronic back pain    arthritis    Diverticulosis    Elevated cholesterol    takes Niacin daily and Simvastatin   GERD (gastroesophageal reflux disease) 06/2004   non-specific gastritis on EGD 06/2004   Headache(784.0)    occasionally   History of colon polyps 2004, 2009   2004:adenomatous. 2009 hyperplastic.    History of hiatal hernia    Hypertension    takes Tenoretic and Lisinopril daily   Mucoid cyst of joint 08/2013   right index finger   Osteopenia 01/2014   T score -1.1 FRAX 14%/0.5%. Stable from prior DEXA   PONV (postoperative nausea and vomiting)    Rotator cuff arthropathy    Left   Seasonal allergies    Stroke ([place]) 1998   x 2 - mild left-sided weakness   Urge incontinence    Uterine prolapse     Past Surgical History:  Procedure Laterality Date   BACK SURGERY     x 2   BELPHAROPTOSIS REPAIR Bilateral 12/14/2017   BUBBLE STUDY  02/16/2021   Procedure: BUBBLE STUDY;  Surgeon: Thayer Headings, MD;  Location: [facility] ENDOSCOPY;  Service: Cardiovascular;;   CATARACT EXTRACTION     CATARACT EXTRACTION Bilateral 10/2017   COLONOSCOPY  2004, 2009, 2014   [place]     HYSTEROSCOPY WITH D & C  12/07/2010   with resection of endometrial polyp   JOINT REPLACEMENT     KNEE ARTHROSCOPY Left 2008   lip biopsy     done at MD office Fri 11/22/13   MASS EXCISION Right 08/15/2013   Procedure: RIGHT INDEX EXCISION MASS ;  Surgeon: Tennis Must, MD;  Location: [place];  Service: Orthopedics;  Laterality: Right;   NASAL SEPTUM SURGERY     OOPHORECTOMY Right 2000   REVERSE SHOULDER ARTHROPLASTY Left 12/11/2018   REVERSE SHOULDER ARTHROPLASTY Left 12/11/2018   Procedure: LEFT REVERSE SHOULDER ARTHROPLASTY;  Surgeon: Meredith Pel, MD;  Location: [place];  Service: Orthopedics;  Laterality: Left;   SHOULDER ARTHROSCOPY WITH OPEN ROTATOR CUFF REPAIR AND DISTAL CLAVICLE ACROMINECTOMY Right 11/26/2013   Procedure: RIGHT SHOULDER ARTHROSCOPY WITH MINI OPEN ROTATOR CUFF REPAIR AND DISTAL CLAVICLE RESECTION, SUBACROMIAL DECOMPRESSION, POSSIBLE [facility] PATCH.;  Surgeon: [name], MD;  Location: [place];  Service: Orthopedics;  Laterality: Right;   TEE WITHOUT CARDIOVERSION N/A 02/16/2021   Procedure: TRANSESOPHAGEAL ECHOCARDIOGRAM (TEE);  Surgeon: Thayer Headings, MD;  Location: [facility]  ENDOSCOPY;  Service: Cardiovascular;  Laterality: N/A;   TOTAL KNEE ARTHROPLASTY Right 03/21/2017   TOTAL KNEE ARTHROPLASTY Right 03/21/2017   Procedure: RIGHT TOTAL KNEE ARTHROPLASTY;  Surgeon: [name], MD;  Location: [place];  Service: Orthopedics;  Laterality: Right;   TUBAL LIGATION      There were no vitals filed for this visit.   Subjective Assessment - 02/26/21 0806     Subjective  "I'm doing most things I want to do, but things take extra time and I get tired."    Pertinent History Recent CVA on 02/13/21 affecting R side, hx of CVA x2 in 1998 affecting L side, hx of R TKA, L TSA, R rotator cuff repair    Limitations weakness, fatigue, impaired [facility]    Patient Stated Goals "Improve my strength, endurance, and  coordination."    Currently in Pain? No/denies    Pain Score 0-No pain               [facility] OT Assessment - 02/25/21 1311       Assessment   Medical Diagnosis Intracerebral hemorrhage L basal ganglia CVA    Referring Provider (OT) Dr. Antony Contras    Onset Date/Surgical Date 02/13/21    Hand Dominance Right    Next MD Visit saw PCP yesterday, will return 6-12 months    Prior Therapy Acute care OT/PT only      Precautions   Precautions None      Restrictions   Weight Bearing Restrictions No      Balance Screen   Has the patient fallen in the past 6 months No    Has the patient had a decrease in activity level because of a fear of falling?  No    Is the patient reluctant to leave their home because of a fear of falling?  No      Home  Environment   Family/patient expects to be discharged to: Private residence    Living Arrangements Spouse/significant other    Available Help at Discharge Family    Type of [place] One level   bonus room upstairs but pt does not typically have to go up there; basement, but pt goes down infrequently and gets down there by driving her Gator outside to the back of the house. Pt has avoided flights of stairs since her R TKA.   Alternate Level Stairs - Number of Steps 2 steps at front    [place] riser;Grab bars - toilet;Cane - single point    Additional Comments Pt not currently having to use a walking aid    Lives With Spouse      Prior Function   Level of Independence Independent    Vocation Retired    [employer] retired from [place] [employer]    Leisure likes movies, pets (has 5 dogs)      ADL   Eating/Feeding Independent    Grooming Set up   difficulty with earrings   Upper Body Bathing Use of adaptive equipment   Pt sits on shower chair to wash hair   Lower Body Bathing Modified independent    Upper Body Dressing Independent    Lower Body Dressing Independent    Toilet  Transfer Modified independent    Tub/Shower Transfer Modified independent    ADL comments ADLs require extra time and cause fatigue      IADL   Prior Level of  Function Chief of Staff independently for [employer]   increased fatigue after short trips to grocery store   Meal Prep Plans, prepares and serves adequate meals independently   with extra time and increased fatigue   Medication Management Is responsible for taking medication in correct dosages at correct time      Mobility   Mobility Status Independent    Mobility Status Comments community mobility requires extra time and pt experiences increased fatigue      Written Expression   Dominant Hand Right    Handwriting Increased time;100% legible      Vision - History   Baseline Vision Wears glasses all the time      Vision Assessment   Ocular Range of Motion Within Functional Limits    Tracking/Visual Pursuits Able to track stimulus in all quads without difficulty    Saccades Within functional limits    Visual Fields No apparent deficits      Activity Tolerance   Activity Tolerance Tolerates 30 min activity with multiple rests   RPE of 5 with IADLs and community mobility (RPE of 2 prior to CVA)     Cognition   Overall Cognitive Status Within Functional Limits for tasks assessed      Observation/Other Assessments   Skin Integrity intact    Focus on Therapeutic Outcomes (FOTO)  72      Sensation   Light Touch Appears Intact      Coordination   Gross Motor Movements are Fluid and Coordinated No    Fine Motor Movements are Fluid and Coordinated No    Coordination and Movement Description mild ataxia bilaterally (pt had baseline weakness and coordination deficits in LUE from stroke in '98 and reports increased weakness in L as well as R side since recent CVA on 02/13/21)    Finger Nose Finger Test mildly ataxia bilaterally    Right 9 Hole Peg Test 30 sec    Left 9 Hole Peg Test 38 sec      AROM    Overall AROM  Within functional limits for tasks performed      Strength   Overall Strength Comments Bilat shoulder flex/abd 4- (hx of L TSA and R rotator cuff repair), bilat elbow flex/ext and wrist flex/ext 4+/5      Hand Function   Right Hand Grip (lbs) 29 lbs    Right Hand Lateral Pinch 12 lbs    Right Hand 3 Point Pinch 10 lbs    Left Hand Grip (lbs) 24    Left Hand Lateral Pinch 10 lbs    Left 3 point pinch 10 lbs    Comment CVAs and arthritis limiting hand strength bilaterally            Occupational Therapy Evaluation: Pt is a 70 y/o female, referred to outpatient Occupational Therapy for BUE deficits which occurred following recent 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  Recent stroke caused weakness and coordination deficits in the RUE, though pt also reports exacerbation of weakness and coordination on the L side from 2 CVAs in 1998.  Pt overall is managing self care tasks, but deficits require extra time and effort.  Pt endorses increased fatigue with community outings, making shorter grocery store trips as a result.  Pt's goal is to increase activity tolerance, and increase bilat hand strength and coordination in order to work towards return to [facility] and maximize efficiency with daily tasks.   Therapeutic Exercise: Issued pink  theraputty and instructed pt in gross grasping, pinching, digit abd/add exercises and digging coins out of putty for increasing hand strength and coordination for self care tasks.  Issued handout with good return demo.      OT Education - 02/26/21 [phone]     Education Details Role of OT, goals, poc    Person(s) Educated Patient    Methods Explanation;Verbal cues    Comprehension Verbalized understanding              OT Short Term Goals - 02/26/21 1039       OT SHORT TERM GOAL #1   Title Pt will perform BUE HEP independently for increasing strenth and coordination for self care tasks.    Baseline Eval: initiated  theraputty exercises at eval    Time 3    Period Weeks    Status New    Target Date 03/17/21               OT Long Term Goals - 02/26/21 1040       OT LONG TERM GOAL #1   Title Pt will improve FOTO score to 80 or better to indicate increased functional performance.    Baseline Eval: FOTO 72    Time 6    Period Weeks    Status New    Target Date 04/07/21      OT LONG TERM GOAL #2   Title Pt will improve bilat hand coordination to ease manipulation and efficiency with managing clothing fasteners.    Baseline Eval: extra time and effort to manage small buttons (R 9 hole peg test 30 sec, L 38 sec)    Time 6    Period Weeks    Status New    Target Date 04/07/21      OT LONG TERM GOAL #3   Title Pt will increase bilat hand strength by 5 or more lbs for improving grasp and manipulation of ADL supplies.    Baseline Eval: R grip 29 lbs, L grip 24 lbs    Time 6    Period Weeks    Status New    Target Date 04/07/21      OT LONG TERM GOAL #4   Title Pt will improve GMC bilaterally for improved accuracy and efficiency when reaching for ADL supplies.    Baseline Eval: mild BUE ataxia    Time 6    Period Weeks    Status New    Target Date 04/07/21      OT LONG TERM GOAL #5   Title Pt will increase activity tolerance for IADLs and community mobility indicated by RPE of 2 or better with these activities.    Baseline Eval: RPE of 5 with IADLs and community mobility.    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  Recent stroke caused weakness and coordination deficits in the RUE, though pt also reports exacerbation of weakness and coordination on the L side from 2 CVAs in 1998.  Pt overall is managing self care  tasks, but deficits require extra time and effort.  Pt endorses increased fatigue with community outings, making shorter grocery  store trips as a result.  Pt's goal is to increase activity tolerance, and increase bilat hand strength and coordination in order to work towards return to [facility] and maximize efficiency with daily tasks.    OT Occupational Profile and History Detailed Assessment- Review of Records and additional review of physical, cognitive, psychosocial history related to current functional performance    Occupational performance deficits (Please refer to evaluation for details): ADL's;IADL's    Body Structure / Function / Physical Skills ADL;Dexterity;Strength;Coordination;FMC;Body mechanics;Endurance;UE functional use;GMC    Rehab Potential Excellent    Clinical Decision Making Several treatment options, min-mod task modification necessary    Comorbidities Affecting Occupational Performance: May have comorbidities impacting occupational performance    Modification or Assistance to Complete Evaluation  No modification of tasks or assist necessary to complete eval    OT Frequency 2x / week    OT Duration 6 weeks    OT Treatment/Interventions Self-care/ADL training;Therapeutic exercise;DME and/or AE instruction;Neuromuscular education;Manual Therapy;Moist Heat;Energy conservation;Therapeutic activities;Patient/family education;Passive range of motion    Plan OT to address strength and coordination deficits bilaterally which impact self care performance    OT Home Exercise Plan issued pink theraputty at eval and instructed in exercises/handout issued    Recommended Other Services N/A    Consulted and Agree with Plan of Care Patient             Patient will benefit from skilled therapeutic intervention in order to improve the following deficits and impairments:   Body Structure / Function / Physical Skills: ADL, Dexterity, Strength, Coordination, FMC, Body mechanics, Endurance, UE  functional use, GMC       Visit Diagnosis: Muscle weakness (generalized)  Other lack of coordination    Problem List Patient Active Problem List   Diagnosis Date Noted   ICH (intracerebral hemorrhage) ([place]) 02/13/2021   Iron deficiency 10/07/2020   Aortic atherosclerosis ([place]) 03/17/2020   Lung nodule seen on imaging study 02/24/2020   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   Chronic left shoulder pain 08/23/2018   Chronic diarrhea 12/07/2014   Segmental colitis with rectal bleeding ([place]) 09/12/2014   Routine general medical examination at a health care facility 06/05/2014   Overweight 06/13/2011   Hyperlipidemia 02/17/2007   Essential hypertension 01/08/2007   Osteoarthritis 01/08/2007   Leta Speller, MS, OTR/L  Darleene Cleaver, OT/L 02/26/2021, 11:01 AM  [place] [address] [place], [place], [address] Phone: [phone]   Fax:  [phone]  Name: [name] MRN: [id number] Date of Birth: 1950-07-25

## 2021-02-27 ENCOUNTER — Other Ambulatory Visit: Payer: Self-pay | Admitting: Internal Medicine

## 2021-03-01 ENCOUNTER — Other Ambulatory Visit: Payer: Self-pay

## 2021-03-01 ENCOUNTER — Ambulatory Visit: Payer: Medicare Other

## 2021-03-01 DIAGNOSIS — M6281 Muscle weakness (generalized): Secondary | ICD-10-CM

## 2021-03-01 DIAGNOSIS — R278 Other lack of coordination: Secondary | ICD-10-CM

## 2021-03-02 NOTE — Therapy (Signed)
[place] MAIN [facility] SERVICES [address] [place], [place], [address] Phone: [phone]   Fax:  [phone]  Occupational Therapy Treatment  Patient Details  Name: [name] MRN: [id number] Date of Birth: [birth year] Referring Provider (OT): Dr. Antony Contras   Encounter Date: 03/01/2021   OT End of Session - 03/02/21 1035     Visit Number 2    Number of Visits 12    Date for OT Re-Evaluation 04/07/21    OT Start Time 1515    OT Stop Time 1600    OT Time Calculation (min) 45 min    Activity Tolerance Patient tolerated treatment well    Behavior During Therapy [facility] for tasks assessed/performed             Past Medical History:  Diagnosis Date   Arthritis    back, fingers with joint pain and swelling.  chronic back pain   Cataract    Chronic back pain    arthritis    Diverticulosis    Elevated cholesterol    takes Niacin daily and Simvastatin   GERD (gastroesophageal reflux disease) 06/2004   non-specific gastritis on EGD 06/2004   Headache(784.0)    occasionally   History of colon polyps 2004, 2009   2004:adenomatous. 2009 hyperplastic.    History of hiatal hernia    Hypertension    takes Tenoretic and Lisinopril daily   Mucoid cyst of joint 08/2013   right index finger   Osteopenia 01/2014   T score -1.1 FRAX 14%/0.5%. Stable from prior DEXA   PONV (postoperative nausea and vomiting)    Rotator cuff arthropathy    Left   Seasonal allergies    Stroke ([place]) 1998   x 2 - mild left-sided weakness   Urge incontinence    Uterine prolapse     Past Surgical History:  Procedure Laterality Date   BACK SURGERY     x 2   BELPHAROPTOSIS REPAIR Bilateral 12/14/2017   BUBBLE STUDY  02/16/2021   Procedure: BUBBLE STUDY;  Surgeon: Thayer Headings, MD;  Location: [facility] ENDOSCOPY;  Service: Cardiovascular;;   CATARACT EXTRACTION     CATARACT EXTRACTION Bilateral 10/2017   COLONOSCOPY  2004, 2009, 2014   [place]     HYSTEROSCOPY WITH D & C  12/07/2010   with resection of endometrial polyp   JOINT REPLACEMENT     KNEE ARTHROSCOPY Left 2008   lip biopsy     done at MD office Fri 11/22/13   MASS EXCISION Right 08/15/2013   Procedure: RIGHT INDEX EXCISION MASS ;  Surgeon: Tennis Must, MD;  Location: [place];  Service: Orthopedics;  Laterality: Right;   NASAL SEPTUM SURGERY     OOPHORECTOMY Right 2000   REVERSE SHOULDER ARTHROPLASTY Left 12/11/2018   REVERSE SHOULDER ARTHROPLASTY Left 12/11/2018   Procedure: LEFT REVERSE SHOULDER ARTHROPLASTY;  Surgeon: Meredith Pel, MD;  Location: [place];  Service: Orthopedics;  Laterality: Left;   SHOULDER ARTHROSCOPY WITH OPEN ROTATOR CUFF REPAIR AND DISTAL CLAVICLE ACROMINECTOMY Right 11/26/2013   Procedure: RIGHT SHOULDER ARTHROSCOPY WITH MINI OPEN ROTATOR CUFF REPAIR AND DISTAL CLAVICLE RESECTION, SUBACROMIAL DECOMPRESSION, POSSIBLE [facility] PATCH.;  Surgeon: [name], MD;  Location: [place];  Service: Orthopedics;  Laterality: Right;   TEE WITHOUT CARDIOVERSION N/A 02/16/2021   Procedure: TRANSESOPHAGEAL ECHOCARDIOGRAM (TEE);  Surgeon: Thayer Headings, MD;  Location: [facility]  ENDOSCOPY;  Service: Cardiovascular;  Laterality: N/A;   TOTAL KNEE ARTHROPLASTY Right 03/21/2017   TOTAL KNEE ARTHROPLASTY Right 03/21/2017   Procedure: RIGHT TOTAL KNEE ARTHROPLASTY;  Surgeon: [name], MD;  Location: [place];  Service: Orthopedics;  Laterality: Right;   TUBAL LIGATION      There were no vitals filed for this visit.   Subjective Assessment - 03/01/21 1032     Subjective  Doing ok today.  We had a death in the family over the weekend.  My husband's brother passed."    Pertinent History Recent CVA on 02/13/21 affecting R side, hx of CVA x2 in 1998 affecting L side, hx of R TKA, L TSA, R rotator cuff repair    Limitations weakness, fatigue, impaired [facility]    Patient Stated Goals "Improve my strength,  endurance, and coordination."    Currently in Pain? Yes    Pain Score 3     Pain Location Knee    Pain Orientation Left;Right    Pain Descriptors / Indicators Aching    Pain Type Chronic pain    Pain Onset More than a month ago    Pain Frequency Occasional    Aggravating Factors  activity    Pain Relieving Factors rest, pain meds    Effect of Pain on Daily Activities discomfort with mobility and transfers            Occupational Therapy Treatment: Neuro re-ed: Pt picked up 1/2"-1" washers from dish and placed over vertical dowels to target small item pick up and reaching toward a target.  OT facilitated forward and lateral reaching at table top level, alternating placement of dowels; RUE with good accuracy all directions, LUE required increased time for item pick up with occasional dropping of washers from L hand.  Participated in washer manipulation (simulated coin activity) activities with washer pick up from table, storage of washers within palm of hand, transferring washers from palm of hand to fingertips to enable discarding one at a time without dropping.  Practiced with 10 washers in each hand, requiring increased time to pick up from flat table top when using L hand, and increased time to discard from palm.   Therapeutic Exercise: Pt participated in standing UBE x 2.5 min forward rotation x 2.5 min reverse rotation, working to increase BUE strength and activity tolerance for self care.  OT adjusted resistance last several min from minimal to no resistance d/t fatigue level.  Facilitated hand strengthening with use of hand gripper set at 11.2# to remove jumbo pegs from pegboard x3 trials for each hand.  Facilitated pinch strengthening with use of therapy resistant clothespins to target lateral and 3 point pinch of R/L hand.  Able to pinch all colors both hands with increased difficulty with L hand on blue and black pins.   Response to Treatment: See Plan/clinical impression  below.    OT Education - 03/01/21 1035     Education Details HEP progression    Person(s) Educated Patient    Methods Explanation;Verbal cues    Comprehension Verbalized understanding              OT Short Term Goals - 02/26/21 1039       OT SHORT TERM GOAL #1   Title Pt will perform BUE HEP independently for increasing strenth and coordination for self care tasks.    Baseline Eval: initiated theraputty exercises at eval    Time 3    Period Weeks    Status  New    Target Date 03/17/21               OT Long Term Goals - 02/26/21 1040       OT LONG TERM GOAL #1   Title Pt will improve FOTO score to 80 or better to indicate increased functional performance.    Baseline Eval: FOTO 72    Time 6    Period Weeks    Status New    Target Date 04/07/21      OT LONG TERM GOAL #2   Title Pt will improve bilat hand coordination to ease manipulation and efficiency with managing clothing fasteners.    Baseline Eval: extra time and effort to manage small buttons (R 9 hole peg test 30 sec, L 38 sec)    Time 6    Period Weeks    Status New    Target Date 04/07/21      OT LONG TERM GOAL #3   Title Pt will increase bilat hand strength by 5 or more lbs for improving grasp and manipulation of ADL supplies.    Baseline Eval: R grip 29 lbs, L grip 24 lbs    Time 6    Period Weeks    Status New    Target Date 04/07/21      OT LONG TERM GOAL #4   Title Pt will improve GMC bilaterally for improved accuracy and efficiency when reaching for ADL supplies.    Baseline Eval: mild BUE ataxia    Time 6    Period Weeks    Status New    Target Date 04/07/21      OT LONG TERM GOAL #5   Title Pt will increase activity tolerance for IADLs and community mobility indicated by RPE of 2 or better with these activities.    Baseline Eval: RPE of 5 with IADLs and community mobility.    Time 6    Period Weeks    Status New    Target Date 04/07/21              Plan - 03/01/21 1055      Clinical Impression Statement Pt with increased fatigue following 5 min of UBE this day, resistance adjusted accordingly.  Rest breaks taken between therapeutic exercises d/t fatigue in bilat hands.  Overall R hand showing improvement in [facility] and strength (side most recently affected by stroke), but pt verbalizes more fatigue and incoordination in LUE (L sided weakness from old CVA but recently exacerbated by new CVA).  Pt will continue to benefit from skilled OT to address BUE strength and coordination deficits which impact tolerance and efficiency with self care tasks.    OT Occupational Profile and History Detailed Assessment- Review of Records and additional review of physical, cognitive, psychosocial history related to current functional performance    Occupational performance deficits (Please refer to evaluation for details): ADL's;IADL's    Body Structure / Function / Physical Skills ADL;Dexterity;Strength;Coordination;FMC;Body mechanics;Endurance;UE functional use;GMC    Rehab Potential Excellent    Clinical Decision Making Several treatment options, min-mod task modification necessary    Comorbidities Affecting Occupational Performance: May have comorbidities impacting occupational performance    Modification or Assistance to Complete Evaluation  No modification of tasks or assist necessary to complete eval    OT Frequency 2x / week    OT Duration 6 weeks    OT Treatment/Interventions Self-care/ADL training;Therapeutic exercise;DME and/or AE instruction;Neuromuscular education;Manual Therapy;Moist Heat;Energy conservation;Therapeutic activities;Patient/family education;Passive range of motion  Plan OT to address strength and coordination deficits bilaterally which impact self care performance    OT Home Exercise Plan issued pink theraputty at eval and instructed in exercises/handout issued    Recommended Other Services N/A    Consulted and Agree with Plan of Care Patient              Patient will benefit from skilled therapeutic intervention in order to improve the following deficits and impairments:   Body Structure / Function / Physical Skills: ADL, Dexterity, Strength, Coordination, FMC, Body mechanics, Endurance, UE functional use, GMC       Visit Diagnosis: Muscle weakness (generalized)  Other lack of coordination    Problem List Patient Active Problem List   Diagnosis Date Noted   ICH (intracerebral hemorrhage) ([place]) 02/13/2021   Iron deficiency 10/07/2020   Aortic atherosclerosis ([place]) 03/17/2020   Lung nodule seen on imaging study 02/24/2020   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   Chronic left shoulder pain 08/23/2018   Chronic diarrhea 12/07/2014   Segmental colitis with rectal bleeding ([place]) 09/12/2014   Routine general medical examination at a health care facility 06/05/2014   Overweight 06/13/2011   Hyperlipidemia 02/17/2007   Essential hypertension 01/08/2007   Osteoarthritis 01/08/2007   Leta Speller, MS, OTR/L  Darleene Cleaver, OT/L 03/02/2021, 10:55 AM  [place] [address] [place], [place], [address] Phone: [phone]   Fax:  [phone]  Name: TORIANN SPADONI MRN: [id number] Date of Birth: 1950-09-14

## 2021-03-03 ENCOUNTER — Other Ambulatory Visit: Payer: Self-pay

## 2021-03-03 ENCOUNTER — Ambulatory Visit: Payer: Medicare Other

## 2021-03-03 DIAGNOSIS — R278 Other lack of coordination: Secondary | ICD-10-CM | POA: Diagnosis not present

## 2021-03-03 DIAGNOSIS — M6281 Muscle weakness (generalized): Secondary | ICD-10-CM

## 2021-03-03 NOTE — Therapy (Signed)
[place] MAIN [facility] SERVICES [address] [place], [place], [address] Phone: [phone]   Fax:  [phone]  Occupational Therapy Treatment  Patient Details  Name: [name] MRN: [id number] Date of Birth: Sep 07, 1950 Referring Provider (OT): Dr. Antony Contras   Encounter Date: 03/03/2021   OT End of Session - 03/03/21 1623     Visit Number 3    Number of Visits 12    Date for OT Re-Evaluation 04/07/21    OT Start Time 1515    OT Stop Time 1600    OT Time Calculation (min) 45 min    Activity Tolerance Patient tolerated treatment well    Behavior During Therapy [facility] for tasks assessed/performed             Past Medical History:  Diagnosis Date   Arthritis    back, fingers with joint pain and swelling.  chronic back pain   Cataract    Chronic back pain    arthritis    Diverticulosis    Elevated cholesterol    takes Niacin daily and Simvastatin   GERD (gastroesophageal reflux disease) 06/2004   non-specific gastritis on EGD 06/2004   Headache(784.0)    occasionally   History of colon polyps 2004, 2009   2004:adenomatous. 2009 hyperplastic.    History of hiatal hernia    Hypertension    takes Tenoretic and Lisinopril daily   Mucoid cyst of joint 08/2013   right index finger   Osteopenia 01/2014   T score -1.1 FRAX 14%/0.5%. Stable from prior DEXA   PONV (postoperative nausea and vomiting)    Rotator cuff arthropathy    Left   Seasonal allergies    Stroke ([place]) 1998   x 2 - mild left-sided weakness   Urge incontinence    Uterine prolapse     Past Surgical History:  Procedure Laterality Date   BACK SURGERY     x 2   BELPHAROPTOSIS REPAIR Bilateral 12/14/2017   BUBBLE STUDY  02/16/2021   Procedure: BUBBLE STUDY;  Surgeon: Thayer Headings, MD;  Location: [facility] ENDOSCOPY;  Service: Cardiovascular;;   CATARACT EXTRACTION     CATARACT EXTRACTION Bilateral 10/2017   COLONOSCOPY  2004, 2009, 2014   [place]     HYSTEROSCOPY WITH D & C  12/07/2010   with resection of endometrial polyp   JOINT REPLACEMENT     KNEE ARTHROSCOPY Left 2008   lip biopsy     done at MD office Fri 11/22/13   MASS EXCISION Right 08/15/2013   Procedure: RIGHT INDEX EXCISION MASS ;  Surgeon: Tennis Must, MD;  Location: [place];  Service: Orthopedics;  Laterality: Right;   NASAL SEPTUM SURGERY     OOPHORECTOMY Right 2000   REVERSE SHOULDER ARTHROPLASTY Left 12/11/2018   REVERSE SHOULDER ARTHROPLASTY Left 12/11/2018   Procedure: LEFT REVERSE SHOULDER ARTHROPLASTY;  Surgeon: Meredith Pel, MD;  Location: [place];  Service: Orthopedics;  Laterality: Left;   SHOULDER ARTHROSCOPY WITH OPEN ROTATOR CUFF REPAIR AND DISTAL CLAVICLE ACROMINECTOMY Right 11/26/2013   Procedure: RIGHT SHOULDER ARTHROSCOPY WITH MINI OPEN ROTATOR CUFF REPAIR AND DISTAL CLAVICLE RESECTION, SUBACROMIAL DECOMPRESSION, POSSIBLE [facility] PATCH.;  Surgeon: [name], MD;  Location: [place];  Service: Orthopedics;  Laterality: Right;   TEE WITHOUT CARDIOVERSION N/A 02/16/2021   Procedure: TRANSESOPHAGEAL ECHOCARDIOGRAM (TEE);  Surgeon: Thayer Headings, MD;  Location: [facility]  ENDOSCOPY;  Service: Cardiovascular;  Laterality: N/A;   TOTAL KNEE ARTHROPLASTY Right 03/21/2017   TOTAL KNEE ARTHROPLASTY Right 03/21/2017   Procedure: RIGHT TOTAL KNEE ARTHROPLASTY;  Surgeon: [name], MD;  Location: [place];  Service: Orthopedics;  Laterality: Right;   TUBAL LIGATION      There were no vitals filed for this visit.   Subjective Assessment - 03/03/21 1619     Subjective  "I'm a little sore today.  My knee is really bad."    Pertinent History Recent CVA on 02/13/21 affecting R side, hx of CVA x2 in 1998 affecting L side, hx of R TKA, L TSA, R rotator cuff repair    Limitations weakness, fatigue, impaired [facility]    Patient Stated Goals "Improve my strength, endurance, and coordination."    Currently in  Pain? Yes    Pain Score 8     Pain Location Knee    Pain Orientation Left    Pain Descriptors / Indicators Aching    Pain Type Chronic pain    Pain Onset More than a month ago    Pain Frequency Intermittent    Aggravating Factors  activity    Pain Relieving Factors rest, pain meds, heat    Effect of Pain on Daily Activities discomfort with mobility and transfers    Multiple Pain Sites Yes    Pain Score 5    Pain Location Neck    Pain Orientation Posterior    Pain Descriptors / Indicators Aching    Pain Type Chronic pain    Pain Onset More than a month ago    Pain Frequency Intermittent    Aggravating Factors  arthritic pain    Pain Relieving Factors rest, heat, pain medications    Effect of Pain on Daily Activities general discomfort with daily activities            Occupational Therapy Treatment: Therapeutic Exercise: Performed bilat shoulder strengthening with 1.5 lb dowel to perform shoulder flex/abd/ER and chest press x7-10 each.  Instructed pt to keep all exercises within pain free range.  Facilitated hand strengthening with use of hand gripper set at 11.9# to place/remove jumbo pegs from pegboard x3 trials using R/L hand.  Alternated hands frequently after each row of pegs to minimize repetition and joint pain.    Neuro re-ed: Facilitated small item pick up, object manipulation, and reaching toward a target by picking up small pegs and placing them in pegboard; alternated use of R and L hands.  Facilitated forward and lateral reaching by changing placement of pegboard on table top.  Further challenged pt with controlled reaching and dexterity, placing pegs upright on table top, placing/removing stones on top of upright pegs without dropping or knocking over, then reaching between and around pegs to gather stones placed between pegs, working to remove stones without knocking over pegs.  Response to Treatment: See Plan/clinical impression below.    OT Education - 03/03/21  1623     Education Details heat for pain management in arthritic joints    Person(s) Educated Patient    Methods Explanation;Verbal cues    Comprehension Verbalized understanding              OT Short Term Goals - 02/26/21 1039       OT SHORT TERM GOAL #1   Title Pt will perform BUE HEP independently for increasing strenth and coordination for self care tasks.    Baseline Eval: initiated theraputty exercises at eval  Time 3    Period Weeks    Status New    Target Date 03/17/21               OT Long Term Goals - 02/26/21 1040       OT LONG TERM GOAL #1   Title Pt will improve FOTO score to 80 or better to indicate increased functional performance.    Baseline Eval: FOTO 72    Time 6    Period Weeks    Status New    Target Date 04/07/21      OT LONG TERM GOAL #2   Title Pt will improve bilat hand coordination to ease manipulation and efficiency with managing clothing fasteners.    Baseline Eval: extra time and effort to manage small buttons (R 9 hole peg test 30 sec, L 38 sec)    Time 6    Period Weeks    Status New    Target Date 04/07/21      OT LONG TERM GOAL #3   Title Pt will increase bilat hand strength by 5 or more lbs for improving grasp and manipulation of ADL supplies.    Baseline Eval: R grip 29 lbs, L grip 24 lbs    Time 6    Period Weeks    Status New    Target Date 04/07/21      OT LONG TERM GOAL #4   Title Pt will improve GMC bilaterally for improved accuracy and efficiency when reaching for ADL supplies.    Baseline Eval: mild BUE ataxia    Time 6    Period Weeks    Status New    Target Date 04/07/21      OT LONG TERM GOAL #5   Title Pt will increase activity tolerance for IADLs and community mobility indicated by RPE of 2 or better with these activities.    Baseline Eval: RPE of 5 with IADLs and community mobility.    Time 6    Period Weeks    Status New    Target Date 04/07/21                Plan - 03/03/21 1643      Clinical Impression Statement Pt showing improved control with reaching toward a target using RUE, LUE with slightly decreased control as compared to L, with mild ataxia noted.  Pt has increased difficulty picking up small objects from table top using L hand, and occasionally drops small objects when trying to store them in palm of L hand.  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  Pt responded well to session today with frequent rests and alternating frequently between R/LUEs to minimize repetitions on 1 side.  Provided pt with hot pack to L knee placed for duration of session while pt participated in seated table top UE activity.  Pt reported heat to be effective in minimizing L knee pain.  Pt will continue to benefit from skilled OT for increasing activity tolerance, BUE strength, and coordination in order to work towards return to PLOF with daily activities.    OT Occupational Profile and History Detailed Assessment- Review of Records and additional review of physical, cognitive, psychosocial history related to current functional performance    Occupational performance deficits (Please refer to evaluation for details): ADL's;IADL's    Body Structure / Function /  Physical Skills ADL;Dexterity;Strength;Coordination;FMC;Body mechanics;Endurance;UE functional use;GMC    Rehab Potential Excellent    Clinical Decision Making Several treatment options, min-mod task modification necessary    Comorbidities Affecting Occupational Performance: May have comorbidities impacting occupational performance    Modification or Assistance to Complete Evaluation  No modification of tasks or assist necessary to complete eval    OT Frequency 2x / week    OT Duration 6 weeks    OT Treatment/Interventions Self-care/ADL training;Therapeutic exercise;DME and/or AE  instruction;Neuromuscular education;Manual Therapy;Moist Heat;Energy conservation;Therapeutic activities;Patient/family education;Passive range of motion    Plan OT to address strength and coordination deficits bilaterally which impact self care performance    OT Home Exercise Plan issued pink theraputty at eval and instructed in exercises/handout issued    Recommended Other Services N/A    Consulted and Agree with Plan of Care Patient             Patient will benefit from skilled therapeutic intervention in order to improve the following deficits and impairments:   Body Structure / Function / Physical Skills: ADL, Dexterity, Strength, Coordination, FMC, Body mechanics, Endurance, UE functional use, GMC       Visit Diagnosis: Muscle weakness (generalized)  Other lack of coordination    Problem List Patient Active Problem List   Diagnosis Date Noted   ICH (intracerebral hemorrhage) ([place]) 02/13/2021   Iron deficiency 10/07/2020   Aortic atherosclerosis ([place]) 03/17/2020   Lung nodule seen on imaging study 02/24/2020   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   Chronic left shoulder pain 08/23/2018   Chronic diarrhea 12/07/2014   Segmental colitis with rectal bleeding ([place]) 09/12/2014   Routine general medical examination at a health care facility 06/05/2014   Overweight 06/13/2011   Hyperlipidemia 02/17/2007   Essential hypertension 01/08/2007   Osteoarthritis 01/08/2007   Leta Speller, MS, OTR/L  Darleene Cleaver, OT/L 03/03/2021, 4:44 PM  [place] MAIN [facility] SERVICES [address] [place], [place], [address] Phone: [phone]   Fax:  [phone]  Name: CATHARINE KETTLEWELL MRN: [id number] Date of Birth: 05/13/1950

## 2021-03-08 ENCOUNTER — Ambulatory Visit: Payer: Medicare Other

## 2021-03-08 ENCOUNTER — Other Ambulatory Visit: Payer: Self-pay

## 2021-03-08 DIAGNOSIS — R278 Other lack of coordination: Secondary | ICD-10-CM | POA: Diagnosis not present

## 2021-03-08 DIAGNOSIS — M6281 Muscle weakness (generalized): Secondary | ICD-10-CM

## 2021-03-09 NOTE — Therapy (Signed)
[place] MAIN [facility] SERVICES [address] [place], [place], [address] Phone: [phone]   Fax:  [phone]  Occupational Therapy Treatment  Patient Details  Name: [name] MRN: [id number] Date of Birth: 10-11-1950 Referring Provider (OT): Dr. Antony Contras   Encounter Date: 03/08/2021   OT End of Session - 03/09/21 0910     Visit Number 4    Number of Visits 12    Date for OT Re-Evaluation 04/07/21    OT Start Time 1515    OT Stop Time 1600    OT Time Calculation (min) 45 min    Activity Tolerance Patient tolerated treatment well    Behavior During Therapy [facility] for tasks assessed/performed             Past Medical History:  Diagnosis Date   Arthritis    back, fingers with joint pain and swelling.  chronic back pain   Cataract    Chronic back pain    arthritis    Diverticulosis    Elevated cholesterol    takes Niacin daily and Simvastatin   GERD (gastroesophageal reflux disease) 06/2004   non-specific gastritis on EGD 06/2004   Headache(784.0)    occasionally   History of colon polyps 2004, 2009   2004:adenomatous. 2009 hyperplastic.    History of hiatal hernia    Hypertension    takes Tenoretic and Lisinopril daily   Mucoid cyst of joint 08/2013   right index finger   Osteopenia 01/2014   T score -1.1 FRAX 14%/0.5%. Stable from prior DEXA   PONV (postoperative nausea and vomiting)    Rotator cuff arthropathy    Left   Seasonal allergies    Stroke ([place]) 1998   x 2 - mild left-sided weakness   Urge incontinence    Uterine prolapse     Past Surgical History:  Procedure Laterality Date   BACK SURGERY     x 2   BELPHAROPTOSIS REPAIR Bilateral 12/14/2017   BUBBLE STUDY  02/16/2021   Procedure: BUBBLE STUDY;  Surgeon: Thayer Headings, MD;  Location: [facility] ENDOSCOPY;  Service: Cardiovascular;;   CATARACT EXTRACTION     CATARACT EXTRACTION Bilateral 10/2017   COLONOSCOPY  2004, 2009, 2014   [place]     HYSTEROSCOPY WITH D & C  12/07/2010   with resection of endometrial polyp   JOINT REPLACEMENT     KNEE ARTHROSCOPY Left 2008   lip biopsy     done at MD office Fri 11/22/13   MASS EXCISION Right 08/15/2013   Procedure: RIGHT INDEX EXCISION MASS ;  Surgeon: Tennis Must, MD;  Location: [place];  Service: Orthopedics;  Laterality: Right;   NASAL SEPTUM SURGERY     OOPHORECTOMY Right 2000   REVERSE SHOULDER ARTHROPLASTY Left 12/11/2018   REVERSE SHOULDER ARTHROPLASTY Left 12/11/2018   Procedure: LEFT REVERSE SHOULDER ARTHROPLASTY;  Surgeon: Meredith Pel, MD;  Location: [place];  Service: Orthopedics;  Laterality: Left;   SHOULDER ARTHROSCOPY WITH OPEN ROTATOR CUFF REPAIR AND DISTAL CLAVICLE ACROMINECTOMY Right 11/26/2013   Procedure: RIGHT SHOULDER ARTHROSCOPY WITH MINI OPEN ROTATOR CUFF REPAIR AND DISTAL CLAVICLE RESECTION, SUBACROMIAL DECOMPRESSION, POSSIBLE [facility] PATCH.;  Surgeon: [name], MD;  Location: [place];  Service: Orthopedics;  Laterality: Right;   TEE WITHOUT CARDIOVERSION N/A 02/16/2021   Procedure: TRANSESOPHAGEAL ECHOCARDIOGRAM (TEE);  Surgeon: Thayer Headings, MD;  Location: [facility]  ENDOSCOPY;  Service: Cardiovascular;  Laterality: N/A;   TOTAL KNEE ARTHROPLASTY Right 03/21/2017   TOTAL KNEE ARTHROPLASTY Right 03/21/2017   Procedure: RIGHT TOTAL KNEE ARTHROPLASTY;  Surgeon: [name], MD;  Location: [place];  Service: Orthopedics;  Laterality: Right;   TUBAL LIGATION      There were no vitals filed for this visit.   Subjective Assessment - 03/08/21 0907     Subjective  "I'm doing ok.  My muscles are still a little sore."    Pertinent History Recent CVA on 02/13/21 affecting R side, hx of CVA x2 in 1998 affecting L side, hx of R TKA, L TSA, R rotator cuff repair    Limitations weakness, fatigue, impaired [facility]    Patient Stated Goals "Improve my strength, endurance, and coordination."    Currently in  Pain? Yes    Pain Score 5     Pain Location Knee    Pain Orientation Left    Pain Descriptors / Indicators Aching    Pain Type Chronic pain    Pain Onset More than a month ago    Pain Frequency Intermittent    Aggravating Factors  activity    Pain Relieving Factors rest, pain meds, heat    Effect of Pain on Daily Activities discomfort with mobility and transfers    Multiple Pain Sites Yes    Pain Score 5    Pain Location Neck    Pain Orientation Posterior    Pain Descriptors / Indicators Aching    Pain Type Chronic pain    Pain Onset More than a month ago    Pain Frequency Intermittent    Aggravating Factors  arthritic pain    Pain Relieving Factors rest, heat, pain medications    Effect of Pain on Daily Activities general discomfort with daily activities            Occupational Therapy Treatment: Therapeutic Exercise: Pt participated in BUE strengthening with use of 1.5# dowel for bilat chest press and all shoulder planes x2 sets 10 reps each.  Cued pt to keep all exercises within pain free range, min vc for postural adjustments.  Instructed in wrist strengthening with use of 2# dumbbell to complete wrist flex/ext/radial and ulnar deviation x10 reps each for R/LUE.  Facilitated grip strengthening with use of hand gripper at light-moderate resistance (1 red rubber band) to complete 5 sets 10 reps of squeezes each hand (3 sets performed beginning of session, 2 sets performed end of session).   Neuro re-ed: Stood at whiteboard to practice reaching toward a target, using marker to dot center of a line of circles, worked tracing, and drawing wavy and jagged lines between a narrow target, alternated R and L hands, working to increase GM accuracy/steadiness.   Response to Treatment: Pt making steady gains toward OT goals.  Pt continues to develop improved GM control, BUE strength, and increased activity tolerance for self care tasks.  LUE with mild shaking when reaching toward a target.   Pt will continue to benefit from skilled OT to address deficits noted above.      OT Education - 03/08/21 0910     Education Details HEP progression to include walking for increasing activity tolerance    Person(s) Educated Patient    Methods Explanation;Verbal cues    Comprehension Verbalized understanding              OT Short Term Goals - 02/26/21 1039       OT SHORT TERM  GOAL #1   Title Pt will perform BUE HEP independently for increasing strenth and coordination for self care tasks.    Baseline Eval: initiated theraputty exercises at eval    Time 3    Period Weeks    Status New    Target Date 03/17/21               OT Long Term Goals - 02/26/21 1040       OT LONG TERM GOAL #1   Title Pt will improve FOTO score to 80 or better to indicate increased functional performance.    Baseline Eval: FOTO 72    Time 6    Period Weeks    Status New    Target Date 04/07/21      OT LONG TERM GOAL #2   Title Pt will improve bilat hand coordination to ease manipulation and efficiency with managing clothing fasteners.    Baseline Eval: extra time and effort to manage small buttons (R 9 hole peg test 30 sec, L 38 sec)    Time 6    Period Weeks    Status New    Target Date 04/07/21      OT LONG TERM GOAL #3   Title Pt will increase bilat hand strength by 5 or more lbs for improving grasp and manipulation of ADL supplies.    Baseline Eval: R grip 29 lbs, L grip 24 lbs    Time 6    Period Weeks    Status New    Target Date 04/07/21      OT LONG TERM GOAL #4   Title Pt will improve GMC bilaterally for improved accuracy and efficiency when reaching for ADL supplies.    Baseline Eval: mild BUE ataxia    Time 6    Period Weeks    Status New    Target Date 04/07/21      OT LONG TERM GOAL #5   Title Pt will increase activity tolerance for IADLs and community mobility indicated by RPE of 2 or better with these activities.    Baseline Eval: RPE of 5 with IADLs and  community mobility.    Time 6    Period Weeks    Status New    Target Date 04/07/21               Plan - 03/08/21 6720     Clinical Impression Statement Pt making steady gains toward OT goals.  Pt continues to develop improved GM control, BUE strength, and increased activity tolerance for self care tasks.  LUE with mild shaking when reaching toward a target.  Pt will continue to benefit from skilled OT to address deficits noted above.    OT Occupational Profile and History Detailed Assessment- Review of Records and additional review of physical, cognitive, psychosocial history related to current functional performance    Occupational performance deficits (Please refer to evaluation for details): ADL's;IADL's    Body Structure / Function / Physical Skills ADL;Dexterity;Strength;Coordination;FMC;Body mechanics;Endurance;UE functional use;GMC    Rehab Potential Excellent    Clinical Decision Making Several treatment options, min-mod task modification necessary    Comorbidities Affecting Occupational Performance: May have comorbidities impacting occupational performance    Modification or Assistance to Complete Evaluation  No modification of tasks or assist necessary to complete eval    OT Frequency 2x / week    OT Duration 6 weeks    OT Treatment/Interventions Self-care/ADL training;Therapeutic exercise;DME and/or AE instruction;Neuromuscular education;Manual Therapy;Moist Heat;Energy conservation;Therapeutic  activities;Patient/family education;Passive range of motion    Plan OT to address strength and coordination deficits bilaterally which impact self care performance    OT Home Exercise Plan issued pink theraputty at eval and instructed in exercises/handout issued    Recommended Other Services N/A    Consulted and Agree with Plan of Care Patient             Patient will benefit from skilled therapeutic intervention in order to improve the following deficits and impairments:    Body Structure / Function / Physical Skills: ADL, Dexterity, Strength, Coordination, FMC, Body mechanics, Endurance, UE functional use, GMC       Visit Diagnosis: Muscle weakness (generalized)  Other lack of coordination    Problem List Patient Active Problem List   Diagnosis Date Noted   ICH (intracerebral hemorrhage) ([place]) 02/13/2021   Iron deficiency 10/07/2020   Aortic atherosclerosis ([place]) 03/17/2020   Lung nodule seen on imaging study 02/24/2020   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   Chronic left shoulder pain 08/23/2018   Chronic diarrhea 12/07/2014   Segmental colitis with rectal bleeding ([place]) 09/12/2014   Routine general medical examination at a health care facility 06/05/2014   Overweight 06/13/2011   Hyperlipidemia 02/17/2007   Essential hypertension 01/08/2007   Osteoarthritis 01/08/2007   Leta Speller, MS, OTR/L  Darleene Cleaver, OT/L 03/09/2021, 9:21 AM  [facility] [address] [place], [place], [address] Phone: [phone]   Fax:  [phone]  Name: [name] MRN: [id number] Date of Birth: 05-Nov-1950

## 2021-03-11 ENCOUNTER — Ambulatory Visit: Payer: Medicare Other | Attending: Neurology

## 2021-03-11 ENCOUNTER — Other Ambulatory Visit: Payer: Self-pay

## 2021-03-11 DIAGNOSIS — R278 Other lack of coordination: Secondary | ICD-10-CM | POA: Diagnosis not present

## 2021-03-11 DIAGNOSIS — M6281 Muscle weakness (generalized): Secondary | ICD-10-CM | POA: Insufficient documentation

## 2021-03-11 DIAGNOSIS — R482 Apraxia: Secondary | ICD-10-CM | POA: Diagnosis not present

## 2021-03-11 NOTE — Therapy (Signed)
[place] MAIN [facility] SERVICES [address] [place], [place], [address] Phone: [phone]   Fax:  [phone]  Occupational Therapy Treatment  Patient Details  Name: [name] MRN: [id number] Date of Birth: [birth year] Referring Provider (OT): Dr. Antony Contras   Encounter Date: 03/11/2021   OT End of Session - 03/11/21 1444     Visit Number 5    Number of Visits 12    Date for OT Re-Evaluation 04/07/21    OT Start Time 1348    OT Stop Time 1433    OT Time Calculation (min) 45 min    Activity Tolerance Patient tolerated treatment well    Behavior During Therapy [facility] for tasks assessed/performed             Past Medical History:  Diagnosis Date   Arthritis    back, fingers with joint pain and swelling.  chronic back pain   Cataract    Chronic back pain    arthritis    Diverticulosis    Elevated cholesterol    takes Niacin daily and Simvastatin   GERD (gastroesophageal reflux disease) 06/2004   non-specific gastritis on EGD 06/2004   Headache(784.0)    occasionally   History of colon polyps 2004, 2009   2004:adenomatous. 2009 hyperplastic.    History of hiatal hernia    Hypertension    takes Tenoretic and Lisinopril daily   Mucoid cyst of joint 08/2013   right index finger   Osteopenia 01/2014   T score -1.1 FRAX 14%/0.5%. Stable from prior DEXA   PONV (postoperative nausea and vomiting)    Rotator cuff arthropathy    Left   Seasonal allergies    Stroke ([place]) 1998   x 2 - mild left-sided weakness   Urge incontinence    Uterine prolapse     Past Surgical History:  Procedure Laterality Date   BACK SURGERY     x 2   BELPHAROPTOSIS REPAIR Bilateral 12/14/2017   BUBBLE STUDY  02/16/2021   Procedure: BUBBLE STUDY;  Surgeon: Thayer Headings, MD;  Location: [facility] ENDOSCOPY;  Service: Cardiovascular;;   CATARACT EXTRACTION     CATARACT EXTRACTION Bilateral 10/2017   COLONOSCOPY  2004, 2009, 2014   [place]     HYSTEROSCOPY WITH D & C  12/07/2010   with resection of endometrial polyp   JOINT REPLACEMENT     KNEE ARTHROSCOPY Left 2008   lip biopsy     done at MD office Fri 11/22/13   MASS EXCISION Right 08/15/2013   Procedure: RIGHT INDEX EXCISION MASS ;  Surgeon: Tennis Must, MD;  Location: [place];  Service: Orthopedics;  Laterality: Right;   NASAL SEPTUM SURGERY     OOPHORECTOMY Right 2000   REVERSE SHOULDER ARTHROPLASTY Left 12/11/2018   REVERSE SHOULDER ARTHROPLASTY Left 12/11/2018   Procedure: LEFT REVERSE SHOULDER ARTHROPLASTY;  Surgeon: Meredith Pel, MD;  Location: [place];  Service: Orthopedics;  Laterality: Left;   SHOULDER ARTHROSCOPY WITH OPEN ROTATOR CUFF REPAIR AND DISTAL CLAVICLE ACROMINECTOMY Right 11/26/2013   Procedure: RIGHT SHOULDER ARTHROSCOPY WITH MINI OPEN ROTATOR CUFF REPAIR AND DISTAL CLAVICLE RESECTION, SUBACROMIAL DECOMPRESSION, POSSIBLE [facility] PATCH.;  Surgeon: [name], MD;  Location: [place];  Service: Orthopedics;  Laterality: Right;   TEE WITHOUT CARDIOVERSION N/A 02/16/2021   Procedure: TRANSESOPHAGEAL ECHOCARDIOGRAM (TEE);  Surgeon: Thayer Headings, MD;  Location: [facility]  ENDOSCOPY;  Service: Cardiovascular;  Laterality: N/A;   TOTAL KNEE ARTHROPLASTY Right 03/21/2017   TOTAL KNEE ARTHROPLASTY Right 03/21/2017   Procedure: RIGHT TOTAL KNEE ARTHROPLASTY;  Surgeon: [name], MD;  Location: [place];  Service: Orthopedics;  Laterality: Right;   TUBAL LIGATION      There were no vitals filed for this visit.   Subjective Assessment - 03/11/21 1352     Subjective  "I woke up this morning and my neck hurt really bad, but it's better now."    Pertinent History Recent CVA on 02/13/21 affecting R side, hx of CVA x2 in 1998 affecting L side, hx of R TKA, L TSA, R rotator cuff repair    Limitations weakness, fatigue, impaired [facility]    Patient Stated Goals "Improve my strength, endurance, and  coordination."    Currently in Pain? Yes    Pain Score 5     Pain Location Neck    Pain Descriptors / Indicators Aching    Pain Type Chronic pain    Pain Onset More than a month ago    Pain Frequency Intermittent    Pain Relieving Factors rest, pain meds, heat    Effect of Pain on Daily Activities general discomfort with activity    Pain Onset More than a month ago            Occupational Therapy Treatment: Therapeutic Exercise: Facilitated hand strengthening with use of hand gripper set at 11.2# to place/remove jumbo pegs from pegboard x3 trials each hand.  Used 2# dowel for BUE strength to complete chest press, shoulder flex, shoulder abd, ER x2 sets 10 reps each.  Limited to 1 set for R shoulder abduction d/t R shoulder pain.  Facilitated Dewy Rose picking up thin 2" sticks to place into pegboard.  OT facilitated forward and lateral reaching adjusting placement of pegboard on table top.  Used non skid surface to pick up sticks.  50% accuracy with either hand to pick up sticks from table top without non skid surface.  Response to Treatment: Pt reports significant arthritic pain this morning at 9/10 in neck, less severe in both shoulders and knees, but feeling better this afternoon after using heat at home.  Moist heat applied to neck intermittently throughout therapy session while pt performed table top activities for strengthening and coordination.  Pt reports energy level is slightly better, but reports a lot going on at home.  Pt continues to present with decreased strength and coordination throughout [place].  OT visits will continue to address these deficits in order to maximize indep, accuracy, and efficiency with daily tasks.     OT Education - 03/11/21 1444     Education Details body mechanics during BUE exercise    Person(s) Educated Patient    Methods Explanation;Verbal cues;Demonstration    Comprehension Verbalized understanding;Returned demonstration              OT Short  Term Goals - 02/26/21 1039       OT SHORT TERM GOAL #1   Title Pt will perform BUE HEP independently for increasing strenth and coordination for self care tasks.    Baseline Eval: initiated theraputty exercises at eval    Time 3    Period Weeks    Status New    Target Date 03/17/21               OT Long Term Goals - 02/26/21 1040       OT LONG TERM GOAL #1  Title Pt will improve FOTO score to 80 or better to indicate increased functional performance.    Baseline Eval: FOTO 72    Time 6    Period Weeks    Status New    Target Date 04/07/21      OT LONG TERM GOAL #2   Title Pt will improve bilat hand coordination to ease manipulation and efficiency with managing clothing fasteners.    Baseline Eval: extra time and effort to manage small buttons (R 9 hole peg test 30 sec, L 38 sec)    Time 6    Period Weeks    Status New    Target Date 04/07/21      OT LONG TERM GOAL #3   Title Pt will increase bilat hand strength by 5 or more lbs for improving grasp and manipulation of ADL supplies.    Baseline Eval: R grip 29 lbs, L grip 24 lbs    Time 6    Period Weeks    Status New    Target Date 04/07/21      OT LONG TERM GOAL #4   Title Pt will improve GMC bilaterally for improved accuracy and efficiency when reaching for ADL supplies.    Baseline Eval: mild BUE ataxia    Time 6    Period Weeks    Status New    Target Date 04/07/21      OT LONG TERM GOAL #5   Title Pt will increase activity tolerance for IADLs and community mobility indicated by RPE of 2 or better with these activities.    Baseline Eval: RPE of 5 with IADLs and community mobility.    Time 6    Period Weeks    Status New    Target Date 04/07/21              Plan - 03/11/21 1455     Clinical Impression Statement Pt reports significant arthritic pain this morning at 9/10 in neck, less severe in both shoulders and knees, but feeling better this afternoon after using heat at home.  Moist heat  applied to neck intermittently throughout therapy session while pt performed table top activities for strengthening and coordination.  Pt reports energy level is slightly better, but reports a lot going on at home.  Pt continues to present with decreased strength and coordination throughout [place].  OT visits will continue to address these deficits in order to maximize indep, accuracy, and efficiency with daily tasks.    OT Occupational Profile and History Detailed Assessment- Review of Records and additional review of physical, cognitive, psychosocial history related to current functional performance    Occupational performance deficits (Please refer to evaluation for details): ADL's;IADL's    Body Structure / Function / Physical Skills ADL;Dexterity;Strength;Coordination;FMC;Body mechanics;Endurance;UE functional use;GMC    Rehab Potential Excellent    Clinical Decision Making Several treatment options, min-mod task modification necessary    Comorbidities Affecting Occupational Performance: May have comorbidities impacting occupational performance    Modification or Assistance to Complete Evaluation  No modification of tasks or assist necessary to complete eval    OT Frequency 2x / week    OT Duration 6 weeks    OT Treatment/Interventions Self-care/ADL training;Therapeutic exercise;DME and/or AE instruction;Neuromuscular education;Manual Therapy;Moist Heat;Energy conservation;Therapeutic activities;Patient/family education;Passive range of motion    Plan OT to address strength and coordination deficits bilaterally which impact self care performance    OT Home Exercise Plan issued pink theraputty at eval and instructed in exercises/handout issued  Recommended Other Services N/A    Consulted and Agree with Plan of Care Patient             Patient will benefit from skilled therapeutic intervention in order to improve the following deficits and impairments:   Body Structure / Function / Physical  Skills: ADL, Dexterity, Strength, Coordination, FMC, Body mechanics, Endurance, UE functional use, GMC       Visit Diagnosis: Muscle weakness (generalized)  Other lack of coordination    Problem List Patient Active Problem List   Diagnosis Date Noted   ICH (intracerebral hemorrhage) ([place]) 02/13/2021   Iron deficiency 10/07/2020   Aortic atherosclerosis ([place]) 03/17/2020   Lung nodule seen on imaging study 02/24/2020   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   Chronic left shoulder pain 08/23/2018   Chronic diarrhea 12/07/2014   Segmental colitis with rectal bleeding ([place]) 09/12/2014   Routine general medical examination at a health care facility 06/05/2014   Overweight 06/13/2011   Hyperlipidemia 02/17/2007   Essential hypertension 01/08/2007   Osteoarthritis 01/08/2007   Stacy Speller, MS, OTR/L  Darleene Cleaver, OT/L 03/11/2021, 2:55 PM  [place] MAIN [facility] SERVICES [address] [place], [place], [address] Phone: [phone]   Fax:  [phone]  Name: KINNEDY MONGIELLO MRN: [id number] Date of Birth: 01/23/1951

## 2021-03-12 ENCOUNTER — Other Ambulatory Visit: Payer: Self-pay | Admitting: Internal Medicine

## 2021-03-12 ENCOUNTER — Ambulatory Visit (INDEPENDENT_AMBULATORY_CARE_PROVIDER_SITE_OTHER): Payer: Medicare Other | Admitting: *Deleted

## 2021-03-12 DIAGNOSIS — I10 Essential (primary) hypertension: Secondary | ICD-10-CM

## 2021-03-12 DIAGNOSIS — E782 Mixed hyperlipidemia: Secondary | ICD-10-CM

## 2021-03-12 DIAGNOSIS — I610 Nontraumatic intracerebral hemorrhage in hemisphere, subcortical: Secondary | ICD-10-CM

## 2021-03-12 NOTE — Patient Instructions (Signed)
Visit [place], thank you for taking time to talk with me today. Please don't hesitate to contact me if I can be of assistance to you before our next scheduled telephone appointment.  Below are the goals we discussed today:   Patient Self-Care Activities Patient [name] will: Take medications as prescribed Attend all scheduled provider appointments Call pharmacy for medication refills Call provider office for new concerns or questions Continue to check blood pressures at home Continue to follow heart healthy, low salt, low cholesterol, GI, low sugar diet Review enclosed educational material about cholesterol   Our next appointment is by telephone on Wednesday, April 21, 2021 at 2:45 pm  Please call the care guide team at [phone] if you need to cancel or reschedule your appointment.   Stacy Rack, RN, BSN, [place] [phone]: direct office  High Cholesterol High cholesterol is a condition in which the blood has high levels of a white, waxy substance similar to fat (cholesterol). The liver makes all the cholesterol that the body needs. The human body needs small amounts of cholesterol to help build cells. A person gets extra or excess cholesterol from the food that he or she eats. The blood carries cholesterol from the liver to the rest of the body. If you have high cholesterol, deposits (plaques) may build up on the walls of your arteries. Arteries are the blood vessels that carry blood away from your heart. These plaques make the arteries narrow and stiff. Cholesterol plaques increase your risk for heart attack and stroke. Work with your health care provider to keep your cholesterol levels in a healthy range. What increases the risk? The following factors may make you more likely to develop this condition: Eating foods that are high in animal fat (saturated fat) or cholesterol. Being overweight. Not  getting enough exercise. A family history of high cholesterol (familial hypercholesterolemia). Use of tobacco products. Having diabetes. What are the signs or symptoms? In most cases, high cholesterol does not usually cause any symptoms. In severe cases, very high cholesterol levels can cause: Fatty bumps under the skin (xanthomas). A white or gray ring around the black center (pupil) of the eye. How is this diagnosed? This condition may be diagnosed based on the results of a blood test. If you are older than 70 years of age, your health care provider may check your cholesterol levels every 4-6 years. You may be checked more often if you have high cholesterol or other risk factors for heart disease. The blood test for cholesterol measures: "Bad" cholesterol, or LDL cholesterol. This is the main type of cholesterol that causes heart disease. The desired level is less than 100 mg/dL (2.59 mmol/L). "Good" cholesterol, or HDL cholesterol. HDL helps protect against heart disease by cleaning the arteries and carrying the LDL to the liver for processing. The desired level for HDL is 60 mg/dL (1.55 mmol/L) or higher. Triglycerides. These are fats that your body can store or burn for energy. The desired level is less than 150 mg/dL (1.69 mmol/L). Total cholesterol. This measures the total amount of cholesterol in your blood and includes LDL, HDL, and triglycerides. The desired level is less than 200 mg/dL (5.17 mmol/L). How is this treated? Treatment for high cholesterol starts with lifestyle changes, such as diet and exercise. Diet changes. You may be asked to eat foods that have more fiber and less saturated fats or added sugar. Lifestyle changes. These may include  regular exercise, maintaining a healthy weight, and quitting use of tobacco products. Medicines. These are given when diet and lifestyle changes have not worked. You may be prescribed a statin medicine to help lower your cholesterol  levels. Follow these instructions at home: Eating and drinking  Eat a healthy, balanced diet. This diet includes: Daily servings of a variety of fresh, frozen, or canned fruits and vegetables. Daily servings of whole grain foods that are rich in fiber. Foods that are low in saturated fats and trans fats. These include poultry and fish without skin, lean cuts of meat, and low-fat dairy products. A variety of fish, especially oily fish that contain omega-3 fatty acids. Aim to eat fish at least 2 times a week. Avoid foods and drinks that have added sugar. Use healthy cooking methods, such as roasting, grilling, broiling, baking, poaching, steaming, and stir-frying. Do not fry your food except for stir-frying. If you drink alcohol: Limit how much you have to: 0-1 drink a day for women who are not pregnant. 0-2 drinks a day for men. Know how much alcohol is in a drink. In the [place] one drink equals one 12 oz bottle of beer (355 mL), one 5 oz glass of wine (148 mL), or one 1 oz glass of hard liquor (44 mL). Lifestyle  Get regular exercise. Aim to exercise for a total of 150 minutes a week. Increase your activity level by doing activities such as gardening, walking, and taking the stairs. Do not use any products that contain nicotine or tobacco. These products include cigarettes, chewing tobacco, and vaping devices, such as e-cigarettes. If you need help quitting, ask your health care provider. General instructions Take over-the-counter and prescription medicines only as told by your health care provider. Keep all follow-up visits. This is important. Where to find more information American Heart Association: www.heart.org National Heart, Lung, and Blood Institute: https://wilson-eaton.com/ Contact a health care provider if: You have trouble achieving or maintaining a healthy diet or weight. You are starting an exercise program. You are unable to stop smoking. Get help right away if: You have chest  pain. You have trouble breathing. You have discomfort or pain in your jaw, neck, back, shoulder, or arm. You have any symptoms of a stroke. "BE FAST" is an easy way to remember the main warning signs of a stroke: B - Balance. Signs are dizziness, sudden trouble walking, or loss of balance. E - Eyes. Signs are trouble seeing or a sudden change in vision. F - Face. Signs are sudden weakness or numbness of the face, or the face or eyelid drooping on one side. A - Arms. Signs are weakness or numbness in an arm. This happens suddenly and usually on one side of the body. S - Speech. Signs are sudden trouble speaking, slurred speech, or trouble understanding what people say. T - Time. Time to call emergency services. Write down what time symptoms started. You have other signs of a stroke, such as: A sudden, severe headache with no known cause. Nausea or vomiting. Seizure. These symptoms may represent a serious problem that is an emergency. Do not wait to see if the symptoms will go away. Get medical help right away. Call your local emergency services (911 in the [place] Do not drive yourself to the hospital. Summary Cholesterol plaques increase your risk for heart attack and stroke. Work with your health care provider to keep your cholesterol levels in a healthy range. Eat a healthy, balanced diet, get regular exercise, and maintain  a healthy weight. Do not use any products that contain nicotine or tobacco. These products include cigarettes, chewing tobacco, and vaping devices, such as e-cigarettes. Get help right away if you have any symptoms of a stroke. This information is not intended to replace advice given to you by your health care provider. Make sure you discuss any questions you have with your health care provider. Document Revised: 06/11/2020 Document Reviewed: 06/01/2020 Elsevier Patient Education  2022 [employer].   Following is a copy of your full care plan:  Care Plan : RN care  Manager Plan of Care  Updates made by Knox Royalty, RN since 03/12/2021 12:00 AM     Problem: Chronic Disease Management Needs   Priority: High     Long-Range Goal: Development of plan of care for long term chronic disease management   Start Date: 03/12/2021  Expected End Date: 03/12/2022  Priority: High  Note:   Current Barriers:  Chronic Disease Management support and education needs related to HTN and HLD Recent hospitalization November 5-8, 2022 for intracerebral hemorrhage/ CVA  RNCM Clinical Goal(s):  Patient will demonstrate ongoing health management independence as evidenced by HLD/ HTN; CVA        through collaboration with RN Care manager, provider, and care team.   Interventions: 1:1 collaboration with primary care provider regarding development and update of comprehensive plan of care as evidenced by provider attestation and co-signature Inter-disciplinary care team collaboration (see longitudinal plan of care) Evaluation of current treatment plan related to  self management and patient's adherence to plan as established by provider Initial assessment completed Reviewed recent hospitalization for ICH/ CVA: patient denies current clinical concerns, states recuperating well, has resumed driving self Confirmed patient continues attending outpatient neuro OT sessions: reports "going fine;" states therapy sessions may decrease from twice a week to once a week "soon"  Hyperlipidemia:  (Status: New goal.) Long Term Goal  Lab Results  Component Value Date   CHOL 178 02/15/2021   HDL 37 (L) 02/15/2021   LDLCALC 122 (H) 02/15/2021   TRIG 93 02/15/2021   CHOLHDL 4.8 02/15/2021    Medication review performed; medication list updated in electronic medical record; patient verbalizes good general understanding of purpose, dosing, and scheduling of medications; no concerns or discrepancies identified as a result of medication review today Provider established cholesterol goals  reviewed; Counseled on importance of regular laboratory monitoring as prescribed; Provided HLD educational materials; Reviewed role and benefits of statin for ASCVD risk reduction; Reviewed importance of limiting foods high in cholesterol; Reviewed exercise goals and target of 150 minutes per week; Screening for signs and symptoms of depression related to chronic disease state;  Assessed social determinant of health barriers;   Hypertension: (Status: New goal.)  Long Term Goal Last practice recorded BP readings:  BP Readings from Last 3 Encounters:  02/23/21 110/70  02/16/21 (!) 123/98  01/13/21 102/80  Most recent eGFR/CrCl: No results found for: EGFR  No components found for: CRCL  Evaluation of current treatment plan related to hypertension self management and patient's adherence to plan as established by provider;   Provided education to patient re: stroke prevention, s/s of heart attack and stroke; Reviewed prescribed diet GI diet for chronic colitis; low salt, heart healthy, low cholesterol Reviewed medications with patient and discussed importance of compliance;  Discussed plans with patient for ongoing care management follow up and provided patient with direct contact information for care management team; Discussed complications of poorly controlled blood pressure such as  heart disease, stroke, circulatory complications, vision complications, kidney impairment, sexual dysfunction;  Confirmed patient continues wearing cardiac monitoring system at home Reviewed recent PCP provider office visit 02/23/21 Reviewed upcoming provider office visits: ongoing neuro OT rehabilitation: 12/6; 12/8; 12/13; 12/15; neurology provider office visit: 04/07/21; (new patient) cardiology provider office visit: April 19, 2021 Confirmed patient monitoring/ recording blood pressures at home BID; reviewed with patient-- reports consistent blood pressures between 114-130/70-80; encouraged her ongoing home  monitoring Reviewed signs/ symptoms CVA along with corresponding action plan: patient verbalizes good understanding of same  Patient Goals/Self-Care Activities: As evidenced by review of EHR, collaboration with care team, and patient reporting during CCM RN CM outreach, Patient [name] will: Take medications as prescribed Attend all scheduled provider appointments Call pharmacy for medication refills Call provider office for new concerns or questions Continue to check blood pressures at home Continue to follow heart healthy, low salt, low cholesterol, GI, low sugar diet Review enclosed educational material about cholesterol medication and cholesterol in foods      Consent to CCM Services: [name] was given information about Chronic Care Management services 02/26/21 including:  CCM service includes personalized support from designated clinical staff supervised by her physician, including individualized plan of care and coordination with other care providers 24/7 contact phone numbers for assistance for urgent and routine care needs. Service will only be billed when office clinical staff spend 20 minutes or more in a month to coordinate care. Only one practitioner may furnish and bill the service in a calendar month. The patient may stop CCM services at any time (effective at the end of the month) by phone call to the office staff. The patient will be responsible for cost sharing (co-pay) of up to 20% of the service fee (after annual deductible is met).  Patient agreed to services and verbal consent obtained.   Patient verbalizes understanding of instructions provided today and agrees to view in MyChart  Telephone follow up appointment with care management team member scheduled for:  Wednesday, April 21, 2021 at 2:45 pm The patient has been provided with contact information for the care management team and has been advised to call with any health related questions or concerns   Stacy Rack, RN, BSN, [place] [phone]: direct office [phone]: mobile

## 2021-03-12 NOTE — Chronic Care Management (AMB) (Signed)
Chronic Care Management   CCM [name] Visit Note  03/12/2021 Name: [name] MRN: [id number] DOB: Apr 29, 1950  Subjective: [name] is a 70 y.o. year old female who is a primary care patient of Hoyt Koch, MD. The care management team was consulted for assistance with disease management and care coordination needs.    Engaged with patient by telephone for initial visit in response to provider referral for case management and/or care coordination services.   Consent to Services:  The patient was given information about Chronic Care Management services, agreed to services, and gave verbal consent 02/26/21 prior to initiation of services.  Please see initial visit note for detailed documentation.  Patient agreed to services and verbal consent obtained.   Assessment: Review of patient past medical history, allergies, medications, health status, including review of consultants reports, laboratory and other test data, was performed as part of comprehensive evaluation and provision of chronic care management services.   SDOH (Social Determinants of Health) assessments and interventions performed:  SDOH Interventions    Flowsheet Row Most Recent Value  SDOH Interventions   Food Insecurity Interventions Intervention Not Indicated  Housing Interventions Intervention Not Indicated  [facility],  has lived there 73 years]  Transportation Interventions Intervention Not Indicated  [patient has resumed driving,  husband can assist as indicated]      CCM Care Plan Allergies  Allergen Reactions   Eggs Or Egg-Derived Products Other (See Comments)    GI UPSET, JOINT PAIN, DIFF. SWALLOWING   Etodolac Other (See Comments)    ABD. CRAMPING   Fish-Derived Products Other (See Comments)    GI UPSET, JOINT PAIN, DIFF. SWALLOWING    Omeprazole Other (See Comments)    ABD. CRAMPING   Pineapple Other (See Comments)    GI UPSET, JOINT PAIN, DIFF. SWALLOWING   Gluten Meal  Diarrhea    GI upset   Outpatient Encounter Medications as of 03/12/2021  Medication Sig   Ascorbic Acid (VITAMIN C) 1000 MG tablet Take 1,000 mg by mouth 2 (two) times daily.   aspirin EC 81 MG EC tablet Take 1 tablet (81 mg total) by mouth daily. Swallow whole.   atenolol-chlorthalidone (TENORETIC) 50-25 MG tablet Take 1 tablet by mouth daily.   b complex vitamins tablet Take 1 tablet by mouth at bedtime.   Calcium Carbonate-Vitamin D (CALCIUM + D PO) Take 1 tablet by mouth at bedtime.   CHELATED MAGNESIUM PO Take 3 tablets by mouth every morning.   Cholecalciferol (D 5000) 5000 units capsule Take 5,000 Units by mouth 3 (three) times daily.   Coenzyme Q10 (COQ10) 100 MG CAPS Take 100 mg by mouth every evening.    diphenoxylate-atropine (LOMOTIL) 2.5-0.025 MG tablet TAKE 1 TABLET BY MOUTH FOUR TIMES A DAY AS NEEDED FOR DIARRHEA OR LOOSE STOOLS (Patient taking differently: Take 1 tablet by mouth 4 (four) times daily as needed for diarrhea or loose stools.)   Ferrous Sulfate (IRON) 325 (65 Fe) MG TABS Take 1 tablet (325 mg total) by mouth 2 (two) times daily. (Patient taking differently: Take 325 mg by mouth every morning.)   Fiber POWD Take 5 mLs by mouth 2 (two) times daily. Heather's Tummy - mix in water and drink   folic acid (FOLVITE) 196 MCG tablet Take 800 mcg by mouth every evening.    hyoscyamine (LEVSIN SL) 0.125 MG SL tablet Take 1-2 tablets by mouth every 4 hours as needed (Patient taking differently: Take 0.125 mg by mouth 2 (two)  times daily as needed (stomach cramps).)   ketotifen (ZADITOR) 0.025 % ophthalmic solution Place 2 drops into both eyes 2 (two) times daily as needed (allergies).   lisinopril (ZESTRIL) 2.5 MG tablet Take 1 tablet (2.5 mg total) by mouth daily.   Melatonin 3 MG TABS Take 3 mg by mouth at bedtime.    methocarbamol (ROBAXIN) 500 MG tablet TAKE 1 TABLET BY MOUTH EVERY 8 HOURS AS NEEDED FOR MUSCLE SPASMS (Patient taking differently: Take 500 mg by mouth every 8  (eight) hours as needed for muscle spasms.)   Multiple Vitamin (MULTIVITAMIN WITH MINERALS) TABS tablet Take 1 tablet by mouth at bedtime.   OVER THE COUNTER MEDICATION Take 5 drops by mouth at bedtime. Lemon Balm   OVER THE COUNTER MEDICATION Take 1 Dose by mouth at bedtime. Cats Claw liquid daily   OVER THE COUNTER MEDICATION Apply 1 application topically at bedtime. Magnesium Lotion   polyvinyl alcohol (LIQUIFILM TEARS) 1.4 % ophthalmic solution Place 1 drop into both eyes daily as needed for dry eyes.   potassium chloride SA (KLOR-CON M20) 20 MEQ tablet Take 1 tablet (20 mEq total) by mouth 2 (two) times daily.   Probiotic Product (PROBIOTIC DAILY PO) Take 1 capsule by mouth 2 (two) times daily.   simvastatin (ZOCOR) 20 MG tablet Take 1 tablet (20 mg total) by mouth daily.   Sodium Fluoride (SODIUM FLUORIDE 5000 PPM) 1.1 % PSTE Place 1 application onto teeth at bedtime.   Zinc Sulfate (ZINC 15 PO) Take 10 drops by mouth at bedtime. Liquid Blend 10 drops equal 15 mg   No facility-administered encounter medications on file as of 03/12/2021.   Patient Active Problem List   Diagnosis Date Noted   ICH (intracerebral hemorrhage) ([place]) 02/13/2021   Iron deficiency 10/07/2020   Aortic atherosclerosis ([place]) 03/17/2020   Lung nodule seen on imaging study 02/24/2020   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   Chronic left shoulder pain 08/23/2018   Chronic diarrhea 12/07/2014   Segmental colitis with rectal bleeding ([place]) 09/12/2014   Routine general medical examination at a health care facility 06/05/2014   Overweight 06/13/2011   Hyperlipidemia 02/17/2007   Essential hypertension 01/08/2007   Osteoarthritis 01/08/2007   Conditions to be addressed/monitored:  HTN, HLD, and ICH  Care Plan : [name] care Manager Plan of Care  Updates made by Stacy Royalty, [name] since 03/12/2021 12:00 AM     Problem: Chronic Disease Management Needs   Priority: High     Long-Range Goal: Development of plan  of care for long term chronic disease management   Start Date: 03/12/2021  Expected End Date: 03/12/2022  Priority: High  Note:   Current Barriers:  Chronic Disease Management support and education needs related to HTN and HLD Recent hospitalization November 5-8, 2022 for intracerebral hemorrhage/ CVA  RNCM Clinical Goal(s):  Patient will demonstrate ongoing health management independence as evidenced by HLD/ HTN; CVA        through collaboration with [name] Care manager, provider, and care team.   Interventions: 1:1 collaboration with primary care provider regarding development and update of comprehensive plan of care as evidenced by provider attestation and co-signature Inter-disciplinary care team collaboration (see longitudinal plan of care) Evaluation of current treatment plan related to  self management and patient's adherence to plan as established by provider Initial assessment completed Reviewed recent hospitalization for ICH/ CVA: patient denies current clinical concerns, states recuperating well, has resumed driving self Confirmed patient continues attending outpatient neuro OT sessions:  reports "going fine;" states therapy sessions may decrease from twice a week to once a week "soon"  Hyperlipidemia:  (Status: New goal.) Long Term Goal  Lab Results  Component Value Date   CHOL 178 02/15/2021   HDL 37 (L) 02/15/2021   LDLCALC 122 (H) 02/15/2021   TRIG 93 02/15/2021   CHOLHDL 4.8 02/15/2021    Medication review performed; medication list updated in electronic medical record; patient verbalizes good general understanding of purpose, dosing, and scheduling of medications; no concerns or discrepancies identified as a result of medication review today Provider established cholesterol goals reviewed; Counseled on importance of regular laboratory monitoring as prescribed; Provided HLD educational materials; Reviewed role and benefits of statin for ASCVD risk reduction; Reviewed  importance of limiting foods high in cholesterol; Reviewed exercise goals and target of 150 minutes per week; Screening for signs and symptoms of depression related to chronic disease state;  Assessed social determinant of health barriers;   Hypertension: (Status: New goal.)  Long Term Goal Last practice recorded BP readings:  BP Readings from Last 3 Encounters:  02/23/21 110/70  02/16/21 (!) 123/98  01/13/21 102/80  Most recent eGFR/CrCl: No results found for: EGFR  No components found for: CRCL  Evaluation of current treatment plan related to hypertension self management and patient's adherence to plan as established by provider;   Provided education to patient re: stroke prevention, s/s of heart attack and stroke; Reviewed prescribed diet GI diet for chronic colitis; low salt, heart healthy, low cholesterol Reviewed medications with patient and discussed importance of compliance;  Discussed plans with patient for ongoing care management follow up and provided patient with direct contact information for care management team; Discussed complications of poorly controlled blood pressure such as heart disease, stroke, circulatory complications, vision complications, kidney impairment, sexual dysfunction;  Confirmed patient continues wearing cardiac monitoring system at home Reviewed recent PCP provider office visit 02/23/21 Reviewed upcoming provider office visits: ongoing neuro OT rehabilitation: 12/6; 12/8; 12/13; 12/15; neurology provider office visit: 04/07/21; (new patient) cardiology provider office visit: April 19, 2021 Confirmed patient monitoring/ recording blood pressures at home BID; reviewed with patient-- reports consistent blood pressures between 114-130/70-80; encouraged her ongoing home monitoring Reviewed signs/ symptoms CVA along with corresponding action plan: patient verbalizes good understanding of same  Patient Goals/Self-Care Activities: As evidenced by review of EHR,  collaboration with care team, and patient reporting during CCM [name] CM outreach, Patient [place] will: Take medications as prescribed Attend all scheduled provider appointments Call pharmacy for medication refills Call provider office for new concerns or questions Continue to check blood pressures at home Continue to follow heart healthy, low salt, low cholesterol, GI, low sugar diet Review enclosed educational material about cholesterol medication and cholesterol in foods      Plan: Telephone follow up appointment with care management team member scheduled for:  Wednesday, April 21, 2021 at 2:45 pm The patient has been provided with contact information for the care management team and has been advised to call with any health related questions or concerns   Stacy Rack, [name], [name], [place] [phone]: direct office [phone]: mobile

## 2021-03-16 ENCOUNTER — Ambulatory Visit: Payer: Medicare Other

## 2021-03-16 ENCOUNTER — Other Ambulatory Visit: Payer: Self-pay

## 2021-03-16 DIAGNOSIS — R278 Other lack of coordination: Secondary | ICD-10-CM

## 2021-03-16 DIAGNOSIS — M6281 Muscle weakness (generalized): Secondary | ICD-10-CM

## 2021-03-16 DIAGNOSIS — R482 Apraxia: Secondary | ICD-10-CM | POA: Diagnosis not present

## 2021-03-16 NOTE — Therapy (Signed)
[place] MAIN [facility] SERVICES [address] [place], [place], [address] Phone: [phone]   Fax:  [phone]  Occupational Therapy Treatment  Patient Details  Name: [name] MRN: [id number] Date of Birth: 1951/02/14 Referring Provider (OT): Dr. Antony Contras   Encounter Date: 03/16/2021   OT End of Session - 03/16/21 1242     Visit Number 6    Number of Visits 12    Date for OT Re-Evaluation 04/07/21    OT Start Time 1146    OT Stop Time 1230    OT Time Calculation (min) 44 min    Activity Tolerance Patient tolerated treatment well    Behavior During Therapy [facility] for tasks assessed/performed             Past Medical History:  Diagnosis Date   Arthritis    back, fingers with joint pain and swelling.  chronic back pain   Cataract    Chronic back pain    arthritis    Diverticulosis    Elevated cholesterol    takes Niacin daily and Simvastatin   GERD (gastroesophageal reflux disease) 06/2004   non-specific gastritis on EGD 06/2004   Headache(784.0)    occasionally   History of colon polyps 2004, 2009   2004:adenomatous. 2009 hyperplastic.    History of hiatal hernia    Hypertension    takes Tenoretic and Lisinopril daily   Mucoid cyst of joint 08/2013   right index finger   Osteopenia 01/2014   T score -1.1 FRAX 14%/0.5%. Stable from prior DEXA   PONV (postoperative nausea and vomiting)    Rotator cuff arthropathy    Left   Seasonal allergies    Stroke ([place]) 1998   x 2 - mild left-sided weakness   Urge incontinence    Uterine prolapse     Past Surgical History:  Procedure Laterality Date   BACK SURGERY     x 2   BELPHAROPTOSIS REPAIR Bilateral 12/14/2017   BUBBLE STUDY  02/16/2021   Procedure: BUBBLE STUDY;  Surgeon: Thayer Headings, MD;  Location: [facility] ENDOSCOPY;  Service: Cardiovascular;;   CATARACT EXTRACTION     CATARACT EXTRACTION Bilateral 10/2017   COLONOSCOPY  2004, 2009, 2014   [facility]     HYSTEROSCOPY WITH D & C  12/07/2010   with resection of endometrial polyp   JOINT REPLACEMENT     KNEE ARTHROSCOPY Left 2008   lip biopsy     done at MD office Fri 11/22/13   MASS EXCISION Right 08/15/2013   Procedure: RIGHT INDEX EXCISION MASS ;  Surgeon: Tennis Must, MD;  Location: [place];  Service: Orthopedics;  Laterality: Right;   NASAL SEPTUM SURGERY     OOPHORECTOMY Right 2000   REVERSE SHOULDER ARTHROPLASTY Left 12/11/2018   REVERSE SHOULDER ARTHROPLASTY Left 12/11/2018   Procedure: LEFT REVERSE SHOULDER ARTHROPLASTY;  Surgeon: Meredith Pel, MD;  Location: [place];  Service: Orthopedics;  Laterality: Left;   SHOULDER ARTHROSCOPY WITH OPEN ROTATOR CUFF REPAIR AND DISTAL CLAVICLE ACROMINECTOMY Right 11/26/2013   Procedure: RIGHT SHOULDER ARTHROSCOPY WITH MINI OPEN ROTATOR CUFF REPAIR AND DISTAL CLAVICLE RESECTION, SUBACROMIAL DECOMPRESSION, POSSIBLE [facility] PATCH.;  Surgeon: [name], MD;  Location: [place];  Service: Orthopedics;  Laterality: Right;   TEE WITHOUT CARDIOVERSION N/A 02/16/2021   Procedure: TRANSESOPHAGEAL ECHOCARDIOGRAM (TEE);  Surgeon: Thayer Headings, MD;  Location: [facility]  ENDOSCOPY;  Service: Cardiovascular;  Laterality: N/A;   TOTAL KNEE ARTHROPLASTY Right 03/21/2017   TOTAL KNEE ARTHROPLASTY Right 03/21/2017   Procedure: RIGHT TOTAL KNEE ARTHROPLASTY;  Surgeon: [name], MD;  Location: [place];  Service: Orthopedics;  Laterality: Right;   TUBAL LIGATION      There were no vitals filed for this visit.   Subjective Assessment - 03/16/21 1236     Subjective  "I'm tired.  I had my 2 granddaughters over night on Sunday."    Pertinent History Recent CVA on 02/13/21 affecting R side, hx of CVA x2 in 1998 affecting L side, hx of R TKA, L TSA, R rotator cuff repair    Limitations weakness, fatigue, impaired [facility]    Patient Stated Goals "Improve my strength, endurance, and coordination."     Currently in Pain? Yes    Pain Score 6     Pain Location Knee    Pain Orientation Left    Pain Descriptors / Indicators Constant;Aching    Pain Type Chronic pain    Pain Onset More than a month ago    Pain Frequency Constant    Aggravating Factors  activity    Pain Relieving Factors rest, pain meds, heat    Effect of Pain on Daily Activities general discomfort with activity    Pain Onset More than a month ago            Occupational Therapy Treatment: Neuro re-ed: Practiced controlled reaching and small item pick up, placing and removing small pegs from pegboard reaching between 2 posts several inches apart with goal to avoid knocking posts with R hand.  Practiced forward, contralateral and ipsilateral reaching with LUE (1 round with RUE without difficulty).  Pt ~75% accurate to reach between 2 posts without hitting posts with L arm.  Improved with cues to slow pace with reaching.  Therapeutic Exercise: Facilitated grip strengthening with use of hand gripper set at minimal resistance (1 red band) to complete 5 sets 10 reps of squeezes to R/L hands; 5 sets spread apart throughout duration of session.  Completed BUE strengthening with use of 2 lb dowel to complete chest press, shoulder flex, ER, and abd x 2 sets 10 reps each with rest breaks between each exercise and set.   Response to Treatment: See Plan/clinical impression below.    OT Education - 03/16/21 1241     Education Details body mechanics during BUE exercise    Person(s) Educated Patient    Methods Explanation;Verbal cues;Demonstration    Comprehension Verbalized understanding;Returned demonstration;Verbal cues required              OT Short Term Goals - 02/26/21 1039       OT SHORT TERM GOAL #1   Title Pt will perform BUE HEP independently for increasing strenth and coordination for self care tasks.    Baseline Eval: initiated theraputty exercises at eval    Time 3    Period Weeks    Status New    Target  Date 03/17/21               OT Long Term Goals - 02/26/21 1040       OT LONG TERM GOAL #1   Title Pt will improve FOTO score to 80 or better to indicate increased functional performance.    Baseline Eval: FOTO 72    Time 6    Period Weeks    Status New    Target Date 04/07/21  OT LONG TERM GOAL #2   Title Pt will improve bilat hand coordination to ease manipulation and efficiency with managing clothing fasteners.    Baseline Eval: extra time and effort to manage small buttons (R 9 hole peg test 30 sec, L 38 sec)    Time 6    Period Weeks    Status New    Target Date 04/07/21      OT LONG TERM GOAL #3   Title Pt will increase bilat hand strength by 5 or more lbs for improving grasp and manipulation of ADL supplies.    Baseline Eval: R grip 29 lbs, L grip 24 lbs    Time 6    Period Weeks    Status New    Target Date 04/07/21      OT LONG TERM GOAL #4   Title Pt will improve GMC bilaterally for improved accuracy and efficiency when reaching for ADL supplies.    Baseline Eval: mild BUE ataxia    Time 6    Period Weeks    Status New    Target Date 04/07/21      OT LONG TERM GOAL #5   Title Pt will increase activity tolerance for IADLs and community mobility indicated by RPE of 2 or better with these activities.    Baseline Eval: RPE of 5 with IADLs and community mobility.    Time 6    Period Weeks    Status New    Target Date 04/07/21              Plan - 03/16/21 1251     Clinical Impression Statement Reps and resistance monitored throughout session with all exercises to avoid aggravating arthritic joints in hands and shoulders.  Pt continues to present with decreased strength and coordination throughout BUEs, L more impaired than R.  OT visits will continue to address these deficits in order to maximize indep, accuracy, and efficiency with daily tasks.    OT Occupational Profile and History Detailed Assessment- Review of Records and additional review of  physical, cognitive, psychosocial history related to current functional performance    Occupational performance deficits (Please refer to evaluation for details): ADL's;IADL's    Body Structure / Function / Physical Skills ADL;Dexterity;Strength;Coordination;FMC;Body mechanics;Endurance;UE functional use;GMC    Rehab Potential Excellent    Clinical Decision Making Several treatment options, min-mod task modification necessary    Comorbidities Affecting Occupational Performance: May have comorbidities impacting occupational performance    Modification or Assistance to Complete Evaluation  No modification of tasks or assist necessary to complete eval    OT Frequency 2x / week    OT Duration 6 weeks    OT Treatment/Interventions Self-care/ADL training;Therapeutic exercise;DME and/or AE instruction;Neuromuscular education;Manual Therapy;Moist Heat;Energy conservation;Therapeutic activities;Patient/family education;Passive range of motion    Plan OT to address strength and coordination deficits bilaterally which impact self care performance    OT Home Exercise Plan issued pink theraputty at eval and instructed in exercises/handout issued    Recommended Other Services N/A    Consulted and Agree with Plan of Care Patient             Patient will benefit from skilled therapeutic intervention in order to improve the following deficits and impairments:   Body Structure / Function / Physical Skills: ADL, Dexterity, Strength, Coordination, FMC, Body mechanics, Endurance, UE functional use, GMC       Visit Diagnosis: Muscle weakness (generalized)  Other lack of coordination    Problem List Patient  Active Problem List   Diagnosis Date Noted   ICH (intracerebral hemorrhage) ([place]) 02/13/2021   Iron deficiency 10/07/2020   Aortic atherosclerosis ([place]) 03/17/2020   Lung nodule seen on imaging study 02/24/2020   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   Chronic left shoulder pain 08/23/2018    Chronic diarrhea 12/07/2014   Segmental colitis with rectal bleeding ([place]) 09/12/2014   Routine general medical examination at a health care facility 06/05/2014   Overweight 06/13/2011   Hyperlipidemia 02/17/2007   Essential hypertension 01/08/2007   Osteoarthritis 01/08/2007   Leta Speller, MS, OTR/L  Darleene Cleaver, OT 03/16/2021, 12:51 PM  [place] [facility]IN [facility] SERVICES [address] [place], [place], [address] Phone: [phone]   Fax:  [phone]  Name: EVELINE SAUVE MRN: [id number] Date of Birth: [birth year]

## 2021-03-18 ENCOUNTER — Ambulatory Visit: Payer: Medicare Other

## 2021-03-18 ENCOUNTER — Other Ambulatory Visit: Payer: Self-pay

## 2021-03-18 DIAGNOSIS — R278 Other lack of coordination: Secondary | ICD-10-CM | POA: Diagnosis not present

## 2021-03-18 DIAGNOSIS — M6281 Muscle weakness (generalized): Secondary | ICD-10-CM

## 2021-03-18 DIAGNOSIS — R482 Apraxia: Secondary | ICD-10-CM | POA: Diagnosis not present

## 2021-03-19 NOTE — Therapy (Signed)
[place] MAIN [facility] SERVICES [address] [place], [place], [address] Phone: [phone]   Fax:  [phone]  Occupational Therapy Treatment  Patient Details  Name: LARENDA REEDY MRN: [id number] Date of Birth: 14-Jan-1951 Referring Provider (OT): Dr. Antony Contras   Encounter Date: 03/18/2021   OT End of Session - 03/19/21 1008     Visit Number 7    Number of Visits 12    Date for OT Re-Evaluation 04/07/21    OT Start Time 1600    OT Stop Time 1644    OT Time Calculation (min) 44 min    Activity Tolerance Patient tolerated treatment well    Behavior During Therapy [facility] for tasks assessed/performed             Past Medical History:  Diagnosis Date   Arthritis    back, fingers with joint pain and swelling.  chronic back pain   Cataract    Chronic back pain    arthritis    Diverticulosis    Elevated cholesterol    takes Niacin daily and Simvastatin   GERD (gastroesophageal reflux disease) 06/2004   non-specific gastritis on EGD 06/2004   Headache(784.0)    occasionally   History of colon polyps 2004, 2009   2004:adenomatous. 2009 hyperplastic.    History of hiatal hernia    Hypertension    takes Tenoretic and Lisinopril daily   Mucoid cyst of joint 08/2013   right index finger   Osteopenia 01/2014   T score -1.1 FRAX 14%/0.5%. Stable from prior DEXA   PONV (postoperative nausea and vomiting)    Rotator cuff arthropathy    Left   Seasonal allergies    Stroke ([place]) 1998   x 2 - mild left-sided weakness   Urge incontinence    Uterine prolapse     Past Surgical History:  Procedure Laterality Date   BACK SURGERY     x 2   BELPHAROPTOSIS REPAIR Bilateral 12/14/2017   BUBBLE STUDY  02/16/2021   Procedure: BUBBLE STUDY;  Surgeon: Thayer Headings, MD;  Location: [facility] ENDOSCOPY;  Service: Cardiovascular;;   CATARACT EXTRACTION     CATARACT EXTRACTION Bilateral 10/2017   COLONOSCOPY  2004, 2009, 2014   [place]     HYSTEROSCOPY WITH D & C  12/07/2010   with resection of endometrial polyp   JOINT REPLACEMENT     KNEE ARTHROSCOPY Left 2008   lip biopsy     done at MD office Fri 11/22/13   MASS EXCISION Right 08/15/2013   Procedure: RIGHT INDEX EXCISION MASS ;  Surgeon: Tennis Must, MD;  Location: [place];  Service: Orthopedics;  Laterality: Right;   NASAL SEPTUM SURGERY     OOPHORECTOMY Right 2000   REVERSE SHOULDER ARTHROPLASTY Left 12/11/2018   REVERSE SHOULDER ARTHROPLASTY Left 12/11/2018   Procedure: LEFT REVERSE SHOULDER ARTHROPLASTY;  Surgeon: Meredith Pel, MD;  Location: [place];  Service: Orthopedics;  Laterality: Left;   SHOULDER ARTHROSCOPY WITH OPEN ROTATOR CUFF REPAIR AND DISTAL CLAVICLE ACROMINECTOMY Right 11/26/2013   Procedure: RIGHT SHOULDER ARTHROSCOPY WITH MINI OPEN ROTATOR CUFF REPAIR AND DISTAL CLAVICLE RESECTION, SUBACROMIAL DECOMPRESSION, POSSIBLE [facility] PATCH.;  Surgeon: [name], MD;  Location: [place];  Service: Orthopedics;  Laterality: Right;   TEE WITHOUT CARDIOVERSION N/A 02/16/2021   Procedure: TRANSESOPHAGEAL ECHOCARDIOGRAM (TEE);  Surgeon: Thayer Headings, MD;  Location: [facility]  ENDOSCOPY;  Service: Cardiovascular;  Laterality: N/A;   TOTAL KNEE ARTHROPLASTY Right 03/21/2017   TOTAL KNEE ARTHROPLASTY Right 03/21/2017   Procedure: RIGHT TOTAL KNEE ARTHROPLASTY;  Surgeon: [name], MD;  Location: [place];  Service: Orthopedics;  Laterality: Right;   TUBAL LIGATION      There were no vitals filed for this visit.   Subjective Assessment - 03/18/21 1005     Subjective  "I feel like it's helping." (pt referring to OT sessions)    Pertinent History Recent CVA on 02/13/21 affecting R side, hx of CVA x2 in 1998 affecting L side, hx of R TKA, L TSA, R rotator cuff repair    Limitations weakness, fatigue, impaired [facility]    Patient Stated Goals "Improve my strength, endurance, and coordination."     Currently in Pain? Yes    Pain Score 6     Pain Location Knee    Pain Orientation Left    Pain Descriptors / Indicators Aching    Pain Type Chronic pain    Pain Onset More than a month ago    Pain Frequency Constant    Aggravating Factors  standing, walking    Pain Relieving Factors rest, pain meds, heat    Effect of Pain on Daily Activities general discomfort with activity    Multiple Pain Sites Yes    Pain Score 5    Pain Location Neck    Pain Orientation Posterior;Mid    Pain Descriptors / Indicators Dull;Discomfort    Pain Type Chronic pain    Pain Onset More than a month ago    Pain Frequency Constant    Aggravating Factors  arthritic pain    Pain Relieving Factors rest, heat, pain medications    Effect of Pain on Daily Activities general discomfort with daily activities            Occupational Therapy Treatment: Therapeutic Exercise: Facilitated L/R grip strengthening with use of hand gripper at moderate resistance (1 green band) x5 sets 10 reps each, working to increase grip strength for improved ability to stabilize, manipulate, and carry ADL supplies.  Performed the 5 sets throughout duration of OT session to allow rest breaks.  Facilitated pinch strengthening with use of therapy resistant clothespins to target lateral and 3 point pinch of R/L hands.  Able to pinch yellow, red (light resistance), green, blue (moderate resistance), and black pins (heavy resistance) with good tolerance on R hand, fair on L, 2 trials each hand for each pinch type.   Neuro re-ed: Practiced controlled reaching and small item pick up, placing and removing small glass stones placed on top of jumbo pegs, reaching between 2 posts several inches apart with goal to avoid knocking posts with R/L hands.  Practiced forward, contralateral and ipsilateral reaching with BUE (1 round with RUE without difficulty, 3 with LUE with moderate difficulty, shakiness).  Pt ~75% accurate to reach between 2 posts  without hitting posts with L arm.  Improved with cues to slow pace with reaching  Response to Treatment: See Plan/clinical impression below.     OT Education - 03/18/21 1008     Education Details body mechanics during BUE exercise    Person(s) Educated Patient    Methods Explanation;Verbal cues;Demonstration    Comprehension Verbalized understanding;Returned demonstration;Verbal cues required              OT Short Term Goals - 02/26/21 1039       OT SHORT TERM GOAL #1  Title Pt will perform BUE HEP independently for increasing strenth and coordination for self care tasks.    Baseline Eval: initiated theraputty exercises at eval    Time 3    Period Weeks    Status New    Target Date 03/17/21               OT Long Term Goals - 02/26/21 1040       OT LONG TERM GOAL #1   Title Pt will improve FOTO score to 80 or better to indicate increased functional performance.    Baseline Eval: FOTO 72    Time 6    Period Weeks    Status New    Target Date 04/07/21      OT LONG TERM GOAL #2   Title Pt will improve bilat hand coordination to ease manipulation and efficiency with managing clothing fasteners.    Baseline Eval: extra time and effort to manage small buttons (R 9 hole peg test 30 sec, L 38 sec)    Time 6    Period Weeks    Status New    Target Date 04/07/21      OT LONG TERM GOAL #3   Title Pt will increase bilat hand strength by 5 or more lbs for improving grasp and manipulation of ADL supplies.    Baseline Eval: R grip 29 lbs, L grip 24 lbs    Time 6    Period Weeks    Status New    Target Date 04/07/21      OT LONG TERM GOAL #4   Title Pt will improve GMC bilaterally for improved accuracy and efficiency when reaching for ADL supplies.    Baseline Eval: mild BUE ataxia    Time 6    Period Weeks    Status New    Target Date 04/07/21      OT LONG TERM GOAL #5   Title Pt will increase activity tolerance for IADLs and community mobility indicated by  RPE of 2 or better with these activities.    Baseline Eval: RPE of 5 with IADLs and community mobility.    Time 6    Period Weeks    Status New    Target Date 04/07/21               Plan - 03/18/21 1022     Clinical Impression Statement Pt demonstrating increased strength and control of movements in RUE; LUE continues to improve but remains slightly weaker, slightly apraxic, and mildly ataxic with reaching activities.  OT visits will continue to address these deficits in order to maximize indep, accuracy, and efficiency with daily tasks.    OT Occupational Profile and History Detailed Assessment- Review of Records and additional review of physical, cognitive, psychosocial history related to current functional performance    Occupational performance deficits (Please refer to evaluation for details): ADL's;IADL's    Body Structure / Function / Physical Skills ADL;Dexterity;Strength;Coordination;FMC;Body mechanics;Endurance;UE functional use;GMC    Rehab Potential Excellent    Clinical Decision Making Several treatment options, min-mod task modification necessary    Comorbidities Affecting Occupational Performance: May have comorbidities impacting occupational performance    Modification or Assistance to Complete Evaluation  No modification of tasks or assist necessary to complete eval    OT Frequency 2x / week    OT Duration 6 weeks    OT Treatment/Interventions Self-care/ADL training;Therapeutic exercise;DME and/or AE instruction;Neuromuscular education;Manual Therapy;Moist Heat;Energy conservation;Therapeutic activities;Patient/family education;Passive range of motion  Plan OT to address strength and coordination deficits bilaterally which impact self care performance    OT Home Exercise Plan issued pink theraputty at eval and instructed in exercises/handout issued    Recommended Other Services N/A    Consulted and Agree with Plan of Care Patient             Patient will  benefit from skilled therapeutic intervention in order to improve the following deficits and impairments:   Body Structure / Function / Physical Skills: ADL, Dexterity, Strength, Coordination, FMC, Body mechanics, Endurance, UE functional use, GMC       Visit Diagnosis: Muscle weakness (generalized)  Other lack of coordination    Problem List Patient Active Problem List   Diagnosis Date Noted   ICH (intracerebral hemorrhage) ([place]) 02/13/2021   Iron deficiency 10/07/2020   Aortic atherosclerosis ([place]) 03/17/2020   Lung nodule seen on imaging study 02/24/2020   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   Chronic left shoulder pain 08/23/2018   Chronic diarrhea 12/07/2014   Segmental colitis with rectal bleeding ([place]) 09/12/2014   Routine general medical examination at a health care facility 06/05/2014   Overweight 06/13/2011   Hyperlipidemia 02/17/2007   Essential hypertension 01/08/2007   Osteoarthritis 01/08/2007   Leta Speller, MS, OTR/L  Darleene Cleaver, OT 03/19/2021, 10:22 AM  [place] MAIN [facility] SERVICES [address] [place], [place], [address] Phone: [phone]   Fax:  [phone]  Name: ZAIDE MCCLENAHAN MRN: [id number] Date of Birth: [birth year]

## 2021-03-23 ENCOUNTER — Ambulatory Visit: Payer: Medicare Other

## 2021-03-23 ENCOUNTER — Other Ambulatory Visit: Payer: Self-pay

## 2021-03-23 DIAGNOSIS — M6281 Muscle weakness (generalized): Secondary | ICD-10-CM | POA: Diagnosis not present

## 2021-03-23 DIAGNOSIS — R278 Other lack of coordination: Secondary | ICD-10-CM | POA: Diagnosis not present

## 2021-03-23 DIAGNOSIS — R482 Apraxia: Secondary | ICD-10-CM | POA: Diagnosis not present

## 2021-03-24 NOTE — Therapy (Signed)
[place] MAIN [facility] SERVICES [address] [place], [place], [address] Phone: [phone]   Fax:  [phone]  Occupational Therapy Treatment  Patient Details  Name: [name] MRN: [id number] Date of Birth: June 18, 1950 Referring Provider (OT): Dr. Antony Contras   Encounter Date: 03/23/2021   OT End of Session - 03/24/21 0827     Visit Number 8    Number of Visits 12    Date for OT Re-Evaluation 04/07/21    OT Start Time 1015    OT Stop Time 1100    OT Time Calculation (min) 45 min    Activity Tolerance Patient tolerated treatment well    Behavior During Therapy [facility] for tasks assessed/performed             Past Medical History:  Diagnosis Date   Arthritis    back, fingers with joint pain and swelling.  chronic back pain   Cataract    Chronic back pain    arthritis    Diverticulosis    Elevated cholesterol    takes Niacin daily and Simvastatin   GERD (gastroesophageal reflux disease) 06/2004   non-specific gastritis on EGD 06/2004   Headache(784.0)    occasionally   History of colon polyps 2004, 2009   2004:adenomatous. 2009 hyperplastic.    History of hiatal hernia    Hypertension    takes Tenoretic and Lisinopril daily   Mucoid cyst of joint 08/2013   right index finger   Osteopenia 01/2014   T score -1.1 FRAX 14%/0.5%. Stable from prior DEXA   PONV (postoperative nausea and vomiting)    Rotator cuff arthropathy    Left   Seasonal allergies    Stroke ([place]) 1998   x 2 - mild left-sided weakness   Urge incontinence    Uterine prolapse     Past Surgical History:  Procedure Laterality Date   BACK SURGERY     x 2   BELPHAROPTOSIS REPAIR Bilateral 12/14/2017   BUBBLE STUDY  02/16/2021   Procedure: BUBBLE STUDY;  Surgeon: Thayer Headings, MD;  Location: [facility] ENDOSCOPY;  Service: Cardiovascular;;   CATARACT EXTRACTION     CATARACT EXTRACTION Bilateral 10/2017   COLONOSCOPY  2004, 2009, 2014   [place]     HYSTEROSCOPY WITH D & C  12/07/2010   with resection of endometrial polyp   JOINT REPLACEMENT     KNEE ARTHROSCOPY Left 2008   lip biopsy     done at MD office Fri 11/22/13   MASS EXCISION Right 08/15/2013   Procedure: RIGHT INDEX EXCISION MASS ;  Surgeon: Tennis Must, MD;  Location: [place];  Service: Orthopedics;  Laterality: Right;   NASAL SEPTUM SURGERY     OOPHORECTOMY Right 2000   REVERSE SHOULDER ARTHROPLASTY Left 12/11/2018   REVERSE SHOULDER ARTHROPLASTY Left 12/11/2018   Procedure: LEFT REVERSE SHOULDER ARTHROPLASTY;  Surgeon: Meredith Pel, MD;  Location: [place];  Service: Orthopedics;  Laterality: Left;   SHOULDER ARTHROSCOPY WITH OPEN ROTATOR CUFF REPAIR AND DISTAL CLAVICLE ACROMINECTOMY Right 11/26/2013   Procedure: RIGHT SHOULDER ARTHROSCOPY WITH MINI OPEN ROTATOR CUFF REPAIR AND DISTAL CLAVICLE RESECTION, SUBACROMIAL DECOMPRESSION, POSSIBLE [facility] PATCH.;  Surgeon: [name], MD;  Location: [place];  Service: Orthopedics;  Laterality: Right;   TEE WITHOUT CARDIOVERSION N/A 02/16/2021   Procedure: TRANSESOPHAGEAL ECHOCARDIOGRAM (TEE);  Surgeon: Thayer Headings, MD;  Location: [facility]  ENDOSCOPY;  Service: Cardiovascular;  Laterality: N/A;   TOTAL KNEE ARTHROPLASTY Right 03/21/2017   TOTAL KNEE ARTHROPLASTY Right 03/21/2017   Procedure: RIGHT TOTAL KNEE ARTHROPLASTY;  Surgeon: [name], MD;  Location: [place];  Service: Orthopedics;  Laterality: Right;   TUBAL LIGATION      There were no vitals filed for this visit.   Subjective Assessment - 03/23/21 0826     Subjective  Pt reports doing well today with no new complaints.    Pertinent History Recent CVA on 02/13/21 affecting R side, hx of CVA x2 in 1998 affecting L side, hx of R TKA, L TSA, R rotator cuff repair    Limitations weakness, fatigue, impaired [facility]    Patient Stated Goals "Improve my strength, endurance, and coordination."    Currently in  Pain? Yes    Pain Score 5     Pain Location Knee    Pain Orientation Left    Pain Descriptors / Indicators Aching    Pain Type Chronic pain    Pain Onset More than a month ago    Pain Frequency Constant    Aggravating Factors  standing, walking    Pain Relieving Factors rest, pain meds, heat    Effect of Pain on Daily Activities general discomfort with activity in neck, shoulders, and L knee from arthritis    Pain Onset More than a month ago            Occupational Therapy Treatment: Therapeutic Exercise: Facilitated grip strengthening with use of hand gripper set at min resistance (1 red band) to complete 5 sets 10 reps of hand squeezes for each hand, completed throughout duration of session to allow rest breaks and minimize stress to joints.  Neuro re-ed: [facility] addressed bilaterally placing small beads onto board and removing with various tweezer types (thin and scissor tweezers); vc to position L forearm and elbow on table top to increase distal control in L hand.  Motor planning with focus on speed to flip circular blocks on table top, 3 trials each hand, with pt able to increase speed with each trial.  Practiced stacking circular blocks table top with outstretched arm to reach forward and laterally, 1 trial with RUE, 2 trials with LUE.  Pt able to perform with each arm without knocking over blocks.   Response to Treatment: See Plan/clinical impression below.   OT Education - 03/23/21 0827     Education Details posture and positioning to increase distal control in BUEs    Person(s) Educated Patient    Methods Explanation;Verbal cues;Demonstration    Comprehension Verbalized understanding;Returned demonstration;Verbal cues required;Need further instruction              OT Short Term Goals - 02/26/21 1039       OT SHORT TERM GOAL #1   Title Pt will perform BUE HEP independently for increasing strenth and coordination for self care tasks.    Baseline Eval: initiated  theraputty exercises at eval    Time 3    Period Weeks    Status New    Target Date 03/17/21               OT Long Term Goals - 02/26/21 1040       OT LONG TERM GOAL #1   Title Pt will improve FOTO score to 80 or better to indicate increased functional performance.    Baseline Eval: FOTO 72    Time 6    Period Weeks  Status New    Target Date 04/07/21      OT LONG TERM GOAL #2   Title Pt will improve bilat hand coordination to ease manipulation and efficiency with managing clothing fasteners.    Baseline Eval: extra time and effort to manage small buttons (R 9 hole peg test 30 sec, L 38 sec)    Time 6    Period Weeks    Status New    Target Date 04/07/21      OT LONG TERM GOAL #3   Title Pt will increase bilat hand strength by 5 or more lbs for improving grasp and manipulation of ADL supplies.    Baseline Eval: R grip 29 lbs, L grip 24 lbs    Time 6    Period Weeks    Status New    Target Date 04/07/21      OT LONG TERM GOAL #4   Title Pt will improve GMC bilaterally for improved accuracy and efficiency when reaching for ADL supplies.    Baseline Eval: mild BUE ataxia    Time 6    Period Weeks    Status New    Target Date 04/07/21      OT LONG TERM GOAL #5   Title Pt will increase activity tolerance for IADLs and community mobility indicated by RPE of 2 or better with these activities.    Baseline Eval: RPE of 5 with IADLs and community mobility.    Time 6    Period Weeks    Status New    Target Date 04/07/21              Plan - 03/23/21 0837     Clinical Impression Statement Pt demonstrating increased strength and control of movements in RUE; LUE continues to improve but remains slightly weaker, slightly apraxic, and mildly ataxic with reaching activities.  OT visits will continue to address these deficits in order to maximize indep, accuracy, and efficiency with daily tasks.    OT Occupational Profile and History Detailed Assessment- Review of  Records and additional review of physical, cognitive, psychosocial history related to current functional performance    Occupational performance deficits (Please refer to evaluation for details): ADL's;IADL's    Body Structure / Function / Physical Skills ADL;Dexterity;Strength;Coordination;FMC;Body mechanics;Endurance;UE functional use;GMC    Rehab Potential Excellent    Clinical Decision Making Several treatment options, min-mod task modification necessary    Comorbidities Affecting Occupational Performance: May have comorbidities impacting occupational performance    Modification or Assistance to Complete Evaluation  No modification of tasks or assist necessary to complete eval    OT Frequency 2x / week    OT Duration 6 weeks    OT Treatment/Interventions Self-care/ADL training;Therapeutic exercise;DME and/or AE instruction;Neuromuscular education;Manual Therapy;Moist Heat;Energy conservation;Therapeutic activities;Patient/family education;Passive range of motion    Plan OT to address strength and coordination deficits bilaterally which impact self care performance    OT Home Exercise Plan issued pink theraputty at eval and instructed in exercises/handout issued    Recommended Other Services N/A    Consulted and Agree with Plan of Care Patient             Patient will benefit from skilled therapeutic intervention in order to improve the following deficits and impairments:   Body Structure / Function / Physical Skills: ADL, Dexterity, Strength, Coordination, FMC, Body mechanics, Endurance, UE functional use, GMC       Visit Diagnosis: Muscle weakness (generalized)  Other lack of coordination  Problem List Patient Active Problem List   Diagnosis Date Noted   ICH (intracerebral hemorrhage) ([place]) 02/13/2021   Iron deficiency 10/07/2020   Aortic atherosclerosis ([place]) 03/17/2020   Lung nodule seen on imaging study 02/24/2020   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    Chronic left shoulder pain 08/23/2018   Chronic diarrhea 12/07/2014   Segmental colitis with rectal bleeding ([place]) 09/12/2014   Routine general medical examination at a health care facility 06/05/2014   Overweight 06/13/2011   Hyperlipidemia 02/17/2007   Essential hypertension 01/08/2007   Osteoarthritis 01/08/2007   Leta Speller, MS, OTR/L  Darleene Cleaver, OT 03/24/2021, 8:37 AM  [place] MAIN [facility] SERVICES [address] [place], [place], [address] Phone: [phone]   Fax:  [phone]  Name: [name] MRN: [id number] Date of Birth: 1950-08-12

## 2021-03-25 ENCOUNTER — Other Ambulatory Visit: Payer: Self-pay

## 2021-03-25 ENCOUNTER — Ambulatory Visit: Payer: Medicare Other

## 2021-03-25 ENCOUNTER — Telehealth: Payer: Self-pay | Admitting: Internal Medicine

## 2021-03-25 DIAGNOSIS — M6281 Muscle weakness (generalized): Secondary | ICD-10-CM | POA: Diagnosis not present

## 2021-03-25 DIAGNOSIS — R278 Other lack of coordination: Secondary | ICD-10-CM | POA: Diagnosis not present

## 2021-03-25 DIAGNOSIS — R482 Apraxia: Secondary | ICD-10-CM | POA: Diagnosis not present

## 2021-03-25 NOTE — Therapy (Signed)
[place] MAIN [facility] SERVICES [address] [place], [place], [address] Phone: [phone]   Fax:  [phone]  Occupational Therapy Treatment  Patient Details  Name: [name] MRN: [id number] Date of Birth: December 28, 1950 Referring Provider (OT): Dr. Antony Contras   Encounter Date: 03/25/2021   OT End of Session - 03/25/21 1716     Visit Number 9    Number of Visits 12    Date for OT Re-Evaluation 04/07/21    OT Start Time 1145    OT Stop Time 1233    OT Time Calculation (min) 48 min    Activity Tolerance Patient tolerated treatment well    Behavior During Therapy [facility] for tasks assessed/performed             Past Medical History:  Diagnosis Date   Arthritis    back, fingers with joint pain and swelling.  chronic back pain   Cataract    Chronic back pain    arthritis    Diverticulosis    Elevated cholesterol    takes Niacin daily and Simvastatin   GERD (gastroesophageal reflux disease) 06/2004   non-specific gastritis on EGD 06/2004   Headache(784.0)    occasionally   History of colon polyps 2004, 2009   2004:adenomatous. 2009 hyperplastic.    History of hiatal hernia    Hypertension    takes Tenoretic and Lisinopril daily   Mucoid cyst of joint 08/2013   right index finger   Osteopenia 01/2014   T score -1.1 FRAX 14%/0.5%. Stable from prior DEXA   PONV (postoperative nausea and vomiting)    Rotator cuff arthropathy    Left   Seasonal allergies    Stroke ([place]) 1998   x 2 - mild left-sided weakness   Urge incontinence    Uterine prolapse     Past Surgical History:  Procedure Laterality Date   BACK SURGERY     x 2   BELPHAROPTOSIS REPAIR Bilateral 12/14/2017   BUBBLE STUDY  02/16/2021   Procedure: BUBBLE STUDY;  Surgeon: Thayer Headings, MD;  Location: [facility] ENDOSCOPY;  Service: Cardiovascular;;   CATARACT EXTRACTION     CATARACT EXTRACTION Bilateral 10/2017   COLONOSCOPY  2004, 2009, 2014   [facility]     HYSTEROSCOPY WITH D & C  12/07/2010   with resection of endometrial polyp   JOINT REPLACEMENT     KNEE ARTHROSCOPY Left 2008   lip biopsy     done at MD office Fri 11/22/13   MASS EXCISION Right 08/15/2013   Procedure: RIGHT INDEX EXCISION MASS ;  Surgeon: Tennis Must, MD;  Location: [place];  Service: Orthopedics;  Laterality: Right;   NASAL SEPTUM SURGERY     OOPHORECTOMY Right 2000   REVERSE SHOULDER ARTHROPLASTY Left 12/11/2018   REVERSE SHOULDER ARTHROPLASTY Left 12/11/2018   Procedure: LEFT REVERSE SHOULDER ARTHROPLASTY;  Surgeon: Meredith Pel, MD;  Location: [place];  Service: Orthopedics;  Laterality: Left;   SHOULDER ARTHROSCOPY WITH OPEN ROTATOR CUFF REPAIR AND DISTAL CLAVICLE ACROMINECTOMY Right 11/26/2013   Procedure: RIGHT SHOULDER ARTHROSCOPY WITH MINI OPEN ROTATOR CUFF REPAIR AND DISTAL CLAVICLE RESECTION, SUBACROMIAL DECOMPRESSION, POSSIBLE [facility] PATCH.;  Surgeon: [name], MD;  Location: [place];  Service: Orthopedics;  Laterality: Right;   TEE WITHOUT CARDIOVERSION N/A 02/16/2021   Procedure: TRANSESOPHAGEAL ECHOCARDIOGRAM (TEE);  Surgeon: Thayer Headings, MD;  Location: [facility]  ENDOSCOPY;  Service: Cardiovascular;  Laterality: N/A;   TOTAL KNEE ARTHROPLASTY Right 03/21/2017   TOTAL KNEE ARTHROPLASTY Right 03/21/2017   Procedure: RIGHT TOTAL KNEE ARTHROPLASTY;  Surgeon: [name], MD;  Location: [place];  Service: Orthopedics;  Laterality: Right;   TUBAL LIGATION      There were no vitals filed for this visit.   Subjective Assessment - 03/25/21 1715     Subjective  "I have a hard time reaching up into my cupboards and reaching behind my back."    Pertinent History Recent CVA on 02/13/21 affecting R side, hx of CVA x2 in 1998 affecting L side, hx of R TKA, L TSA, R rotator cuff repair    Limitations weakness, fatigue, impaired [facility]    Patient Stated Goals "Improve my strength, endurance, and  coordination."    Currently in Pain? Yes    Pain Score 6     Pain Location Knee    Pain Orientation Left    Pain Descriptors / Indicators Aching    Pain Type Chronic pain    Pain Onset More than a month ago    Pain Frequency Constant    Aggravating Factors  standing, walking    Pain Relieving Factors rest, meds, heat    Effect of Pain on Daily Activities general discomfort with activity in neck, shoulders, and L knee from arthritis    Pain Onset More than a month ago            Occupational Therapy Treatment: Neuro re-ed: Participation in R/LUE reaching toward a target, picking up narrow 2" sticks and sliding them through small hole for target with fairly good accuracy; R better than L.  Practiced object manipulation/small item pick up, with manipulation of 2" sticks, picking sticks up from table, storing in hand, and discarding 1 at a time.    Therapeutic Exercise: Facilitated hand strengthening with use of hand gripper set at 11.2# to remove jumbo pegs from pegboard x1 trial with R hand and 2 trials with L hand.  Instructed pt in cane stretches for improving shoulder flexibility, completing flexion and ER supine on mat, and IR behind back in standing, mod vc for positioning.  Instructed to complete several times weekly and perform within pain free range for improving shoulder flexibility for better reach for self care.  Handout issued but continued review needed.  Response to Treatment: Pt demonstrating increased strength and control of movements in RUE; LUE continues to improve but remains slightly weaker, slightly apraxic, and mildly ataxic with reaching activities.  Pt also has pain and stiffness in bilat shoulders which limits reach into cupboards and reach behind back during self care tasks.  OT visits will continue to address these deficits in order to maximize indep, accuracy, and efficiency with daily tasks.       OT Education - 03/25/21 1716     Education Details Cane  stretches for bilat shoulder flexibility    Person(s) Educated Patient    Methods Explanation;Verbal cues;Demonstration;Handout    Comprehension Verbalized understanding;Returned demonstration;Verbal cues required;Need further instruction              OT Short Term Goals - 02/26/21 1039       OT SHORT TERM GOAL #1   Title Pt will perform BUE HEP independently for increasing strenth and coordination for self care tasks.    Baseline Eval: initiated theraputty exercises at eval    Time 3    Period Weeks    Status New  Target Date 03/17/21               OT Long Term Goals - 02/26/21 1040       OT LONG TERM GOAL #1   Title Pt will improve FOTO score to 80 or better to indicate increased functional performance.    Baseline Eval: FOTO 72    Time 6    Period Weeks    Status New    Target Date 04/07/21      OT LONG TERM GOAL #2   Title Pt will improve bilat hand coordination to ease manipulation and efficiency with managing clothing fasteners.    Baseline Eval: extra time and effort to manage small buttons (R 9 hole peg test 30 sec, L 38 sec)    Time 6    Period Weeks    Status New    Target Date 04/07/21      OT LONG TERM GOAL #3   Title Pt will increase bilat hand strength by 5 or more lbs for improving grasp and manipulation of ADL supplies.    Baseline Eval: R grip 29 lbs, L grip 24 lbs    Time 6    Period Weeks    Status New    Target Date 04/07/21      OT LONG TERM GOAL #4   Title Pt will improve GMC bilaterally for improved accuracy and efficiency when reaching for ADL supplies.    Baseline Eval: mild BUE ataxia    Time 6    Period Weeks    Status New    Target Date 04/07/21      OT LONG TERM GOAL #5   Title Pt will increase activity tolerance for IADLs and community mobility indicated by RPE of 2 or better with these activities.    Baseline Eval: RPE of 5 with IADLs and community mobility.    Time 6    Period Weeks    Status New    Target Date  04/07/21              Plan - 03/25/21 1732     Clinical Impression Statement Pt demonstrating increased strength and control of movements in RUE; LUE continues to improve but remains slightly weaker, slightly apraxic, and mildly ataxic with reaching activities.  Pt also has pain and stiffness in bilat shoulders which limits reach into cupboards and reach behind back during self care tasks.  OT visits will continue to address these deficits in order to maximize indep, accuracy, and efficiency with daily tasks.    OT Occupational Profile and History Detailed Assessment- Review of Records and additional review of physical, cognitive, psychosocial history related to current functional performance    Occupational performance deficits (Please refer to evaluation for details): ADL's;IADL's    Body Structure / Function / Physical Skills ADL;Dexterity;Strength;Coordination;FMC;Body mechanics;Endurance;UE functional use;GMC    Rehab Potential Excellent    Clinical Decision Making Several treatment options, min-mod task modification necessary    Comorbidities Affecting Occupational Performance: May have comorbidities impacting occupational performance    Modification or Assistance to Complete Evaluation  No modification of tasks or assist necessary to complete eval    OT Frequency 2x / week    OT Duration 6 weeks    OT Treatment/Interventions Self-care/ADL training;Therapeutic exercise;DME and/or AE instruction;Neuromuscular education;Manual Therapy;Moist Heat;Energy conservation;Therapeutic activities;Patient/family education;Passive range of motion    Plan OT to address strength and coordination deficits bilaterally which impact self care performance    OT Home Exercise Plan  issued pink theraputty at eval and instructed in exercises/handout issued    Recommended Other Services N/A    Consulted and Agree with Plan of Care Patient             Patient will benefit from skilled therapeutic  intervention in order to improve the following deficits and impairments:   Body Structure / Function / Physical Skills: ADL, Dexterity, Strength, Coordination, FMC, Body mechanics, Endurance, UE functional use, GMC       Visit Diagnosis: Muscle weakness (generalized)  Other lack of coordination    Problem List Patient Active Problem List   Diagnosis Date Noted   ICH (intracerebral hemorrhage) ([place]) 02/13/2021   Iron deficiency 10/07/2020   Aortic atherosclerosis ([place]) 03/17/2020   Lung nodule seen on imaging study 02/24/2020   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   Chronic left shoulder pain 08/23/2018   Chronic diarrhea 12/07/2014   Segmental colitis with rectal bleeding ([place]) 09/12/2014   Routine general medical examination at a health care facility 06/05/2014   Overweight 06/13/2011   Hyperlipidemia 02/17/2007   Essential hypertension 01/08/2007   Osteoarthritis 01/08/2007   Leta Speller, MS, OTR/L  Darleene Cleaver, OT 03/25/2021, 5:33 PM  [place] MAIN [facility] SERVICES [place], [place], [address] Phone: [phone]   Fax:  [phone]  Name: [name] MRN: [id number] Date of Birth: 20-Apr-1950

## 2021-03-25 NOTE — Telephone Encounter (Signed)
Left message for patient to call me back at [phone] to schedule Medicare Annual Wellness Visit   Last AWV  02/24/20  Please schedule at anytime with LB Pace if patient calls the office back.    40 Minutes appointment   Any questions, please call me at [phone]

## 2021-03-30 ENCOUNTER — Ambulatory Visit: Payer: Medicare Other

## 2021-03-30 ENCOUNTER — Other Ambulatory Visit: Payer: Self-pay

## 2021-03-30 DIAGNOSIS — M6281 Muscle weakness (generalized): Secondary | ICD-10-CM

## 2021-03-30 DIAGNOSIS — R278 Other lack of coordination: Secondary | ICD-10-CM | POA: Diagnosis not present

## 2021-03-30 DIAGNOSIS — R482 Apraxia: Secondary | ICD-10-CM | POA: Diagnosis not present

## 2021-03-30 NOTE — Therapy (Signed)
[place] MAIN [facility] SERVICES [address] [place], [place], [address] Phone: [phone]   Fax:  [phone]  Occupational Therapy Treatment/Progress Note/Recert Reporting period beginning 02/25/21-03/30/21 Patient Details  Name: GLORIS SHIROMA MRN: [id number] Date of Birth: 1950-10-22 Referring Provider (OT): Dr. Antony Contras   Encounter Date: 03/30/2021   OT End of Session - 03/30/21 1426     Visit Number 10    Number of Visits 12    Date for OT Re-Evaluation 04/07/21    OT Start Time 1145    OT Stop Time 1230    OT Time Calculation (min) 45 min    Activity Tolerance Patient tolerated treatment well    Behavior During Therapy [facility] for tasks assessed/performed             Past Medical History:  Diagnosis Date   Arthritis    back, fingers with joint pain and swelling.  chronic back pain   Cataract    Chronic back pain    arthritis    Diverticulosis    Elevated cholesterol    takes Niacin daily and Simvastatin   GERD (gastroesophageal reflux disease) 06/2004   non-specific gastritis on EGD 06/2004   Headache(784.0)    occasionally   History of colon polyps 2004, 2009   2004:adenomatous. 2009 hyperplastic.    History of hiatal hernia    Hypertension    takes Tenoretic and Lisinopril daily   Mucoid cyst of joint 08/2013   right index finger   Osteopenia 01/2014   T score -1.1 FRAX 14%/0.5%. Stable from prior DEXA   PONV (postoperative nausea and vomiting)    Rotator cuff arthropathy    Left   Seasonal allergies    Stroke ([place]) 1998   x 2 - mild left-sided weakness   Urge incontinence    Uterine prolapse     Past Surgical History:  Procedure Laterality Date   BACK SURGERY     x 2   BELPHAROPTOSIS REPAIR Bilateral 12/14/2017   BUBBLE STUDY  02/16/2021   Procedure: BUBBLE STUDY;  Surgeon: Thayer Headings, MD;  Location: [facility] ENDOSCOPY;  Service: Cardiovascular;;   CATARACT EXTRACTION     CATARACT EXTRACTION  Bilateral 10/2017   COLONOSCOPY  2004, 2009, 2014   [place]     HYSTEROSCOPY WITH D & C  12/07/2010   with resection of endometrial polyp   JOINT REPLACEMENT     KNEE ARTHROSCOPY Left 2008   lip biopsy     done at MD office Fri 11/22/13   MASS EXCISION Right 08/15/2013   Procedure: RIGHT INDEX EXCISION MASS ;  Surgeon: Tennis Must, MD;  Location: [place];  Service: Orthopedics;  Laterality: Right;   NASAL SEPTUM SURGERY     OOPHORECTOMY Right 2000   REVERSE SHOULDER ARTHROPLASTY Left 12/11/2018   REVERSE SHOULDER ARTHROPLASTY Left 12/11/2018   Procedure: LEFT REVERSE SHOULDER ARTHROPLASTY;  Surgeon: Meredith Pel, MD;  Location: [place];  Service: Orthopedics;  Laterality: Left;   SHOULDER ARTHROSCOPY WITH OPEN ROTATOR CUFF REPAIR AND DISTAL CLAVICLE ACROMINECTOMY Right 11/26/2013   Procedure: RIGHT SHOULDER ARTHROSCOPY WITH MINI OPEN ROTATOR CUFF REPAIR AND DISTAL CLAVICLE RESECTION, SUBACROMIAL DECOMPRESSION, POSSIBLE [facility] PATCH.;  Surgeon: [name], MD;  Location: [place];  Service: Orthopedics;  Laterality: Right;   TEE WITHOUT CARDIOVERSION N/A 02/16/2021   Procedure: TRANSESOPHAGEAL ECHOCARDIOGRAM (TEE);  Surgeon: Thayer Headings,  MD;  Location: [place];  Service: Cardiovascular;  Laterality: N/A;   TOTAL KNEE ARTHROPLASTY Right 03/21/2017   TOTAL KNEE ARTHROPLASTY Right 03/21/2017   Procedure: RIGHT TOTAL KNEE ARTHROPLASTY;  Surgeon: [name], MD;  Location: [place];  Service: Orthopedics;  Laterality: Right;   TUBAL LIGATION      There were no vitals filed for this visit.   Subjective Assessment - 03/30/21 1424     Subjective  Pt reports agreeable to address bilat shoulder strength and ROM with therapy/poc extension.    Pertinent History Recent CVA on 02/13/21 affecting R side, hx of CVA x2 in 1998 affecting L side, hx of R TKA, L TSA, R rotator cuff repair    Limitations weakness,  fatigue, impaired [facility]    Patient Stated Goals "Improve my strength, endurance, and coordination."    Currently in Pain? Yes    Pain Score 6     Pain Location Knee    Pain Orientation Left    Pain Descriptors / Indicators Aching    Pain Type Chronic pain    Pain Radiating Towards none    Pain Onset More than a month ago    Pain Frequency Constant    Aggravating Factors  standing, walking    Pain Relieving Factors rest, meds, heat    Effect of Pain on Daily Activities general discomfort with activity in neck, shoulders, and L knee from arthritis    Pain Onset More than a month ago            Occupational Therapy Treatment/Progress Note/Recert: Therapeutic Exercise: Remeasured objective measures for progress note, see note for details.  Facilitated pinch strengthening with use of therapy resistant clothespins to target lateral and 3 point pinch of R/L hands.  Challenged shoulder flex and abduction having pt reach above shoulder level to vertical target to clip pins.  Neuro re-ed: Participated in coin manipulation activities with L hand, with coin pick up from table, storage of coins within palm of hand, transferring coins from palm of hand to fingertips to enable discarding coins one at a time without dropping, and placing coins in slotted bank.  Extra time needed for picking coins up from table on more slippery surface.   Response to Treatment: Pt making steady gains with OT goals.  Bilat grip strength has improved, BUE GMC/FMC is steadily improving, with no further RUE ataxia noted, and mild in the LUE.  Pt reports difficulty reaching into overhead cabinets at home and wishes to add goals for shoulder flexibility and strengthening to ease overhead tasks.  Goals extended to end of January to add strengthening for bilat shoulders and will continue to address LUE coordination deficits and continue strengthening bilat hands for daily tasks.  Pt in agreement with poc.    [facility] OT Assessment -  03/31/21 0001       Assessment   Medical Diagnosis CVA    Onset Date/Surgical Date 02/13/21      Activity Tolerance   Activity Tolerance Tolerates 30 min activity with multiple rests   RPE 4 for IADLs     Observation/Other Assessments   Focus on Therapeutic Outcomes (FOTO)  54 (question asked related to LUE; answered for RUE at eval d/t both arms affected)      Coordination   Right 9 Hole Peg Test 27 sec    Left 9 Hole Peg Test 29 sec      AROM   Overall AROM  Other (comment)   Pt reporting difficulty  reaching overhead into cabinets; see shoulder mobility below   Overall AROM Comments R shoulder active flexion 130, L 105; R shoulder active abd 105, L 100      Strength   Overall Strength Comments Bilat shoulder flex/abd 4- (hx of L TSA and R rotator cuff repair), bilat elbow flex/ext and wrist flex/ext 4+/5      Hand Function   Right Hand Grip (lbs) 35    Right Hand Lateral Pinch 13 lbs    Right Hand 3 Point Pinch 10 lbs    Left Hand Grip (lbs) 31    Left Hand Lateral Pinch 10 lbs    Left 3 point pinch 10 lbs              OT Education - 03/30/21 1426     Education Details Progress towards goals    Person(s) Educated Patient    Methods Explanation;Verbal cues;Demonstration;Handout    Comprehension Verbalized understanding;Returned demonstration;Verbal cues required;Need further instruction              OT Short Term Goals - 03/30/21 0810       OT SHORT TERM GOAL #1   Title Pt will perform BUE HEP independently for increasing strenth and coordination for self care tasks.    Baseline Eval: initiated theraputty exercises at eval; pt is indep with theraputty/additions with shoulder exercises requires vc    Time 3    Period Weeks    Status On-going    Target Date 04/21/21               OT Long Term Goals - 03/30/21 1158       OT LONG TERM GOAL #1   Title Pt will improve FOTO score to 80 or better to indicate increased functional performance.     Baseline Eval: FOTO 72 (questions answered related to RUE); 03/30/21 FOTO 54 (questions answered related to LUE)    Time 5    Period Weeks    Status On-going    Target Date 05/08/21      OT LONG TERM GOAL #2   Title Pt will improve bilat hand coordination to ease manipulation and efficiency with managing clothing fasteners.    Baseline Eval: extra time and effort to manage small buttons (R 9 hole peg test 30 sec, L 38 sec); 03/30/21: R 9 hole 27 sec, L 29, improving with clothing fasteners (extra time)    Time 5    Period Weeks    Status On-going    Target Date 05/08/21      OT LONG TERM GOAL #3   Title Pt will increase bilat hand strength by 5 or more lbs for improving grasp and manipulation of ADL supplies.    Baseline Eval: R grip 29 lbs, L grip 24 lbs; 03/2021: R grip 35 lbs, L grip 31 lbs    Time 5    Period Weeks    Status On-going    Target Date 05/08/21      OT LONG TERM GOAL #4   Title Pt will improve GMC bilaterally for improved accuracy and efficiency when reaching for ADL supplies.    Baseline Eval: mild BUE ataxia; Good accuracy with RUE reaching, mild ataxia LUE and difficulty reaching above shoulder level for ADL supplies in either arm    Time 5    Period Weeks    Status On-going    Target Date 05/08/21      OT LONG TERM GOAL #5   Title Pt  will increase activity tolerance for IADLs and community mobility indicated by RPE of 2 or better with these activities.    Baseline Eval: RPE of 5 with IADLs and community mobility; 03/30/2021: RPE 4    Time 5    Period Weeks    Status On-going    Target Date 05/08/21      Long Term Additional Goals   Additional Long Term Goals Yes      OT LONG TERM GOAL #6   Title Pt will increase bilat shoulder flex/abd strength to ease over head reach for ADL supplies.    Baseline 03/30/21: R/L shoulder flex/abd 4-/5    Time 5    Period Weeks    Status New    Target Date 05/08/21              Plan - 03/30/21 1315      Clinical Impression Statement Pt making steady gains with OT goals.  Bilat grip strength has improved, BUE GMC/FMC is steadily improving, with no further RUE ataxia noted, and mild in the LUE.  Pt reports difficulty reaching into overhead cabinets at home and wishes to add goals for shoulder flexibility and strengthening to ease overhead tasks.  Goals extended to end of January to add strengthening for bilat shoulders and will continue to address LUE coordination deficits and continue strengthening bilat hands for daily tasks.  Pt in agreement with poc.    OT Occupational Profile and History Detailed Assessment- Review of Records and additional review of physical, cognitive, psychosocial history related to current functional performance    Occupational performance deficits (Please refer to evaluation for details): ADL's;IADL's    Body Structure / Function / Physical Skills ADL;Dexterity;Strength;Coordination;FMC;Body mechanics;Endurance;UE functional use;GMC    Rehab Potential Excellent    Clinical Decision Making Several treatment options, min-mod task modification necessary    Comorbidities Affecting Occupational Performance: May have comorbidities impacting occupational performance    Modification or Assistance to Complete Evaluation  No modification of tasks or assist necessary to complete eval    OT Frequency 2x / week    OT Duration 6 weeks    OT Treatment/Interventions Self-care/ADL training;Therapeutic exercise;DME and/or AE instruction;Neuromuscular education;Manual Therapy;Moist Heat;Energy conservation;Therapeutic activities;Patient/family education;Passive range of motion    Plan OT to address strength and coordination deficits bilaterally which impact self care performance    OT Home Exercise Plan issued pink theraputty at eval and instructed in exercises/handout issued    Recommended Other Services N/A    Consulted and Agree with Plan of Care Patient             Patient will  benefit from skilled therapeutic intervention in order to improve the following deficits and impairments:   Body Structure / Function / Physical Skills: ADL, Dexterity, Strength, Coordination, FMC, Body mechanics, Endurance, UE functional use, GMC       Visit Diagnosis: Muscle weakness (generalized)  Other lack of coordination    Problem List Patient Active Problem List   Diagnosis Date Noted   ICH (intracerebral hemorrhage) ([place]) 02/13/2021   Iron deficiency 10/07/2020   Aortic atherosclerosis ([place]) 03/17/2020   Lung nodule seen on imaging study 02/24/2020   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   Chronic left shoulder pain 08/23/2018   Chronic diarrhea 12/07/2014   Segmental colitis with rectal bleeding ([place]) 09/12/2014   Routine general medical examination at a health care facility 06/05/2014   Overweight 06/13/2011   Hyperlipidemia 02/17/2007   Essential hypertension 01/08/2007   Osteoarthritis 01/08/2007  Leta Speller, MS, OTR/L  Darleene Cleaver, OT 03/31/2021, 1:16 PM  [place] MAIN [facility] SERVICES [address] [place], [place], [address] Phone: [phone]   Fax:  [phone]  Name: GENIE WENKE MRN: [id number] Date of Birth: 02-16-1951

## 2021-03-31 DIAGNOSIS — Z20822 Contact with and (suspected) exposure to covid-19: Secondary | ICD-10-CM | POA: Diagnosis not present

## 2021-04-01 ENCOUNTER — Other Ambulatory Visit: Payer: Self-pay | Admitting: Medical

## 2021-04-01 ENCOUNTER — Ambulatory Visit: Payer: Medicare Other

## 2021-04-01 ENCOUNTER — Other Ambulatory Visit: Payer: Self-pay

## 2021-04-01 DIAGNOSIS — M6281 Muscle weakness (generalized): Secondary | ICD-10-CM | POA: Diagnosis not present

## 2021-04-01 DIAGNOSIS — I491 Atrial premature depolarization: Secondary | ICD-10-CM

## 2021-04-01 DIAGNOSIS — R482 Apraxia: Secondary | ICD-10-CM | POA: Diagnosis not present

## 2021-04-01 DIAGNOSIS — I639 Cerebral infarction, unspecified: Secondary | ICD-10-CM

## 2021-04-01 DIAGNOSIS — R278 Other lack of coordination: Secondary | ICD-10-CM

## 2021-04-01 DIAGNOSIS — I4891 Unspecified atrial fibrillation: Secondary | ICD-10-CM

## 2021-04-01 NOTE — Therapy (Signed)
[place] MAIN [facility] SERVICES [address] [place], [place], [address] Phone: [phone]   Fax:  [phone]  Occupational Therapy Treatment  Patient Details  Name: [name] MRN: [id number] Date of Birth: 1950-08-31 Referring Provider (OT): Dr. Antony Contras   Encounter Date: 04/01/2021   OT End of Session - 04/01/21 1658     Visit Number 11    Number of Visits 21    Date for OT Re-Evaluation 05/08/21    Authorization Time Period Reporting period beginning 04/01/21    OT Start Time 1345    OT Stop Time 1430    OT Time Calculation (min) 45 min    Activity Tolerance Patient tolerated treatment well    Behavior During Therapy [facility] for tasks assessed/performed             Past Medical History:  Diagnosis Date   Arthritis    back, fingers with joint pain and swelling.  chronic back pain   Cataract    Chronic back pain    arthritis    Diverticulosis    Elevated cholesterol    takes Niacin daily and Simvastatin   GERD (gastroesophageal reflux disease) 06/2004   non-specific gastritis on EGD 06/2004   Headache(784.0)    occasionally   History of colon polyps 2004, 2009   2004:adenomatous. 2009 hyperplastic.    History of hiatal hernia    Hypertension    takes Tenoretic and Lisinopril daily   Mucoid cyst of joint 08/2013   right index finger   Osteopenia 01/2014   T score -1.1 FRAX 14%/0.5%. Stable from prior DEXA   PONV (postoperative nausea and vomiting)    Rotator cuff arthropathy    Left   Seasonal allergies    Stroke ([place]) 1998   x 2 - mild left-sided weakness   Urge incontinence    Uterine prolapse     Past Surgical History:  Procedure Laterality Date   BACK SURGERY     x 2   BELPHAROPTOSIS REPAIR Bilateral 12/14/2017   BUBBLE STUDY  02/16/2021   Procedure: BUBBLE STUDY;  Surgeon: Thayer Headings, MD;  Location: [facility] ENDOSCOPY;  Service: Cardiovascular;;   CATARACT EXTRACTION     CATARACT EXTRACTION  Bilateral 10/2017   COLONOSCOPY  2004, 2009, 2014   [place]     HYSTEROSCOPY WITH D & C  12/07/2010   with resection of endometrial polyp   JOINT REPLACEMENT     KNEE ARTHROSCOPY Left 2008   lip biopsy     done at MD office Fri 11/22/13   MASS EXCISION Right 08/15/2013   Procedure: RIGHT INDEX EXCISION MASS ;  Surgeon: Tennis Must, MD;  Location: [place];  Service: Orthopedics;  Laterality: Right;   NASAL SEPTUM SURGERY     OOPHORECTOMY Right 2000   REVERSE SHOULDER ARTHROPLASTY Left 12/11/2018   REVERSE SHOULDER ARTHROPLASTY Left 12/11/2018   Procedure: LEFT REVERSE SHOULDER ARTHROPLASTY;  Surgeon: Meredith Pel, MD;  Location: [place];  Service: Orthopedics;  Laterality: Left;   SHOULDER ARTHROSCOPY WITH OPEN ROTATOR CUFF REPAIR AND DISTAL CLAVICLE ACROMINECTOMY Right 11/26/2013   Procedure: RIGHT SHOULDER ARTHROSCOPY WITH MINI OPEN ROTATOR CUFF REPAIR AND DISTAL CLAVICLE RESECTION, SUBACROMIAL DECOMPRESSION, POSSIBLE [facility] PATCH.;  Surgeon: [name], MD;  Location: [place];  Service: Orthopedics;  Laterality: Right;   TEE WITHOUT CARDIOVERSION N/A 02/16/2021   Procedure: TRANSESOPHAGEAL ECHOCARDIOGRAM (  TEE);  Surgeon: Acie Fredrickson Wonda Cheng, MD;  Location: [facility] ENDOSCOPY;  Service: Cardiovascular;  Laterality: N/A;   TOTAL KNEE ARTHROPLASTY Right 03/21/2017   TOTAL KNEE ARTHROPLASTY Right 03/21/2017   Procedure: RIGHT TOTAL KNEE ARTHROPLASTY;  Surgeon: [name], MD;  Location: [place];  Service: Orthopedics;  Laterality: Right;   TUBAL LIGATION      There were no vitals filed for this visit.   Subjective Assessment - 04/01/21 1348     Subjective  "I got in the tub yesterday."    Pertinent History Recent CVA on 02/13/21 affecting R side, hx of CVA x2 in 1998 affecting L side, hx of R TKA, L TSA, R rotator cuff repair    Limitations weakness, fatigue, impaired [facility]    Patient Stated Goals "Improve my  strength, endurance, and coordination."    Currently in Pain? Yes    Pain Score 4     Pain Location Knee    Pain Orientation Left    Pain Descriptors / Indicators Aching    Pain Type Chronic pain    Pain Radiating Towards none    Pain Onset More than a month ago    Pain Frequency Constant    Aggravating Factors  standing, walking    Pain Relieving Factors rest, meds, heat    Effect of Pain on Daily Activities general discomfort with activity in neck, shoulders, and L knee from arthritis    Pain Onset More than a month ago            Occupational Therapy Treatment: Therapeutic Exercise: Pt completed hand gripper set at min resistance with 1 red band to complete grip strengthening x5 sets 10 reps each hand, sets completed throughout duration of session to allow rest breaks.  Shoulder flexion stretch overhead and ER stretch to top of head with 1# dowel in prep for strengthening.  Performed BUE strengthening with use of 3# dumbbell for R/L bicep, and 1# for R/L shoulder flex/abd, 2 sets 10 reps each.  Min guard for shoulder strengthening to for form and technique.    Neuro re-ed: Performed in hand manipulation skills/small item pick up with use of small pegs into pegboard and miniature light bright pegs.  Extra time for picking up and placing into board with L hand.   Response to Treatment: See Plan/clinical impression below.     OT Education - 04/01/21 1657     Education Details pain management, HEP progression    Person(s) Educated Patient    Methods Explanation;Verbal cues;Demonstration    Comprehension Verbalized understanding;Returned demonstration;Verbal cues required;Need further instruction              OT Short Term Goals - 03/30/21 0810       OT SHORT TERM GOAL #1   Title Pt will perform BUE HEP independently for increasing strenth and coordination for self care tasks.    Baseline Eval: initiated theraputty exercises at eval; pt is indep with  theraputty/additions with shoulder exercises requires vc    Time 3    Period Weeks    Status On-going    Target Date 04/21/21               OT Long Term Goals - 03/30/21 1158       OT LONG TERM GOAL #1   Title Pt will improve FOTO score to 80 or better to indicate increased functional performance.    Baseline Eval: FOTO 72 (questions answered related to RUE); 03/30/21 FOTO 54 (questions  answered related to LUE)    Time 5    Period Weeks    Status On-going    Target Date 05/08/21      OT LONG TERM GOAL #2   Title Pt will improve bilat hand coordination to ease manipulation and efficiency with managing clothing fasteners.    Baseline Eval: extra time and effort to manage small buttons (R 9 hole peg test 30 sec, L 38 sec); 03/30/21: R 9 hole 27 sec, L 29, improving with clothing fasteners (extra time)    Time 5    Period Weeks    Status On-going    Target Date 05/08/21      OT LONG TERM GOAL #3   Title Pt will increase bilat hand strength by 5 or more lbs for improving grasp and manipulation of ADL supplies.    Baseline Eval: R grip 29 lbs, L grip 24 lbs; 03/2021: R grip 35 lbs, L grip 31 lbs    Time 5    Period Weeks    Status On-going    Target Date 05/08/21      OT LONG TERM GOAL #4   Title Pt will improve GMC bilaterally for improved accuracy and efficiency when reaching for ADL supplies.    Baseline Eval: mild BUE ataxia; Good accuracy with RUE reaching, mild ataxia LUE and difficulty reaching above shoulder level for ADL supplies in either arm    Time 5    Period Weeks    Status On-going    Target Date 05/08/21      OT LONG TERM GOAL #5   Title Pt will increase activity tolerance for IADLs and community mobility indicated by RPE of 2 or better with these activities.    Baseline Eval: RPE of 5 with IADLs and community mobility; 03/30/2021: RPE 4    Time 5    Period Weeks    Status On-going    Target Date 05/08/21      Long Term Additional Goals   Additional  Long Term Goals Yes      OT LONG TERM GOAL #6   Title Pt will increase bilat shoulder flex/abd strength to ease over head reach for ADL supplies.    Baseline 03/30/21: R/L shoulder flex/abd 4-/5    Time 5    Period Weeks    Status New    Target Date 05/08/21              Plan - 04/01/21 1715     Clinical Impression Statement Fairly good tolerance to bilat shoulder strengthening this day.  Limited shoulder exercise to 1# weight at 2 sets only.  Hot pack alternated between R/L shoulders during session today for pain management/muscle relaxation simultaneous with hand coordination activities.  OT encouraged use of heat at home prn for pain management, gentle stretching within pain tolerance for bilat shoulders.  Pt will continue to benefit from skilled OT for increasing BUE strength and coordination in order to improve indep and efficiency with self care tasks.    OT Occupational Profile and History Detailed Assessment- Review of Records and additional review of physical, cognitive, psychosocial history related to current functional performance    Occupational performance deficits (Please refer to evaluation for details): ADL's;IADL's    Body Structure / Function / Physical Skills ADL;Dexterity;Strength;Coordination;FMC;Body mechanics;Endurance;UE functional use;GMC    Rehab Potential Excellent    Clinical Decision Making Several treatment options, min-mod task modification necessary    Comorbidities Affecting Occupational Performance: May have comorbidities impacting  occupational performance    Modification or Assistance to Complete Evaluation  No modification of tasks or assist necessary to complete eval    OT Frequency 2x / week    OT Duration 6 weeks    OT Treatment/Interventions Self-care/ADL training;Therapeutic exercise;DME and/or AE instruction;Neuromuscular education;Manual Therapy;Moist Heat;Energy conservation;Therapeutic activities;Patient/family education;Passive range of motion     Plan OT to address strength and coordination deficits bilaterally which impact self care performance    OT Home Exercise Plan issued pink theraputty at eval and instructed in exercises/handout issued    Recommended Other Services N/A    Consulted and Agree with Plan of Care Patient             Patient will benefit from skilled therapeutic intervention in order to improve the following deficits and impairments:   Body Structure / Function / Physical Skills: ADL, Dexterity, Strength, Coordination, FMC, Body mechanics, Endurance, UE functional use, GMC       Visit Diagnosis: Apraxia  Muscle weakness (generalized)  Other lack of coordination    Problem List Patient Active Problem List   Diagnosis Date Noted   ICH (intracerebral hemorrhage) ([place]) 02/13/2021   Iron deficiency 10/07/2020   Aortic atherosclerosis ([place]) 03/17/2020   Lung nodule seen on imaging study 02/24/2020   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   Chronic left shoulder pain 08/23/2018   Chronic diarrhea 12/07/2014   Segmental colitis with rectal bleeding ([place]) 09/12/2014   Routine general medical examination at a health care facility 06/05/2014   Overweight 06/13/2011   Hyperlipidemia 02/17/2007   Essential hypertension 01/08/2007   Osteoarthritis 01/08/2007   Stacy Speller, MS, OTR/L  Darleene Cleaver, OT 04/01/2021, 5:16 PM  [place] MAIN [facility] SERVICES [address] [place], [place], [address] Phone: [phone]   Fax:  [phone]  Name: ASHLYND MICHNA MRN: [id number] Date of Birth: 04/08/1951

## 2021-04-06 ENCOUNTER — Encounter (HOSPITAL_BASED_OUTPATIENT_CLINIC_OR_DEPARTMENT_OTHER): Payer: Self-pay | Admitting: Internal Medicine

## 2021-04-06 ENCOUNTER — Ambulatory Visit: Payer: Medicare Other

## 2021-04-07 ENCOUNTER — Telehealth: Payer: Self-pay | Admitting: Adult Health

## 2021-04-07 ENCOUNTER — Encounter: Payer: Self-pay | Admitting: Adult Health

## 2021-04-07 ENCOUNTER — Ambulatory Visit (INDEPENDENT_AMBULATORY_CARE_PROVIDER_SITE_OTHER): Payer: Medicare Other | Admitting: Adult Health

## 2021-04-07 VITALS — BP 117/80 | HR 78 | Ht 62.0 in | Wt 164.0 lb

## 2021-04-07 DIAGNOSIS — I10 Essential (primary) hypertension: Secondary | ICD-10-CM

## 2021-04-07 DIAGNOSIS — Z9189 Other specified personal risk factors, not elsewhere classified: Secondary | ICD-10-CM

## 2021-04-07 DIAGNOSIS — I610 Nontraumatic intracerebral hemorrhage in hemisphere, subcortical: Secondary | ICD-10-CM

## 2021-04-07 DIAGNOSIS — E785 Hyperlipidemia, unspecified: Secondary | ICD-10-CM

## 2021-04-07 NOTE — Progress Notes (Signed)
[facility] [address] [place]. [place] [address] [phone]       HOSPITAL FOLLOW UP NOTE  [name] Date of Birth:  12/21/1950 Medical Record Number:  [id number]   Reason for Referral:  hospital stroke follow up    SUBJECTIVE:   CHIEF COMPLAINT:  Chief Complaint  Patient presents with   Follow-up    RM 3 alone Pt is well, continuing OT with improvement. No new stroke symptoms    HPI:   [name] is a 70 y.o. female with history of HTN and stroke who presented on 02/13/2021 with right sided weakness and headache.  Personally reviewed hospitalization pertinent progress notes, lab work and imaging.  Evaluated by Dr. Leonie Man for left basal ganglia hemorrhagic infarct with indeterminate etiology, low suspicion hypertensive related as blood pressure not significantly elevated and on the low end of normal and MRI showed remote age right occipital hemorrhagic infarct as well as nonhemorrhagic right superior temporal and left cerebellar embolic infarcts.  EF 65 to 70%.  TEE unremarkable.  Recommended 30-day cardiac event monitor to evaluate for possible A. fib.  LDL 62.  A1c 5.3.  On aspirin 81 mg BID PTA and recommended aspirin 81 mg daily and continuation of simvastatin.  Home HTN meds held in setting of low and BP and recommended restarting when appropriate. Residual RUE weakness with mild right-sided ataxia.  Therapies recommended outpatient therapies and d/c'd home on 11/8  Today, 04/07/2021, patient being seen for initial hospital follow-up unaccompanied.  Overall doing well.  Currently working with OT with only minimal weakness on RUE.Still some fatigue and activity intolerance more so than baseline.  Does admit to nocturia every 2-3 hours and snoring. Has not previously been tested for sleep apnea.  Completed 30-day cardiac event monitor which did not show evidence of A. fib.  Has follow-up with cardiology on 1/9 to review results.  Compliant  on aspirin and simvastatin without side effects.  Blood pressures today 117/80 -routinely monitors at home and typically 110-120s/70-80s.  No further concerns at this time      PERTINENT IMAGING  Per recent hospitalization CT head  IPH in left basal ganglia with mild edema but no mass effect  MRI  Hemorrhage at left lentiform nucleus, chronic hemorrhage in encephalomalacia at right occipital pole 2D Echo EF 65-70%. No atrial shunt  EKG SR w PAC's TEE EF 60 to 65% without evidence of thrombus or PFO Recommend 30 day cardiac monitor post discharge. Notified cardiology app staff.  LDL 62 HgbA1c 5.3    ROS:   14 system review of systems performed and negative with exception of those listed in HPI  PMH:  Past Medical History:  Diagnosis Date   Arthritis    back, fingers with joint pain and swelling.  chronic back pain   Cataract    Chronic back pain    arthritis    Diverticulosis    Elevated cholesterol    takes Niacin daily and Simvastatin   GERD (gastroesophageal reflux disease) 06/2004   non-specific gastritis on EGD 06/2004   Headache(784.0)    occasionally   History of colon polyps 2004, 2009   2004:adenomatous. 2009 hyperplastic.    History of hiatal hernia    Hypertension    takes Tenoretic and Lisinopril daily   Mucoid cyst of joint 08/2013   right index finger   Osteopenia 01/2014   T score -1.1 FRAX 14%/0.5%. Stable from prior DEXA   PONV (postoperative nausea  and vomiting)    Rotator cuff arthropathy    Left   Seasonal allergies    Stroke ([place]) 1998   x 2 - mild left-sided weakness   Urge incontinence    Uterine prolapse     PSH:  Past Surgical History:  Procedure Laterality Date   BACK SURGERY     x 2   BELPHAROPTOSIS REPAIR Bilateral 12/14/2017   BUBBLE STUDY  02/16/2021   Procedure: BUBBLE STUDY;  Surgeon: Thayer Headings, MD;  Location: [facility] ENDOSCOPY;  Service: Cardiovascular;;   CATARACT EXTRACTION     CATARACT EXTRACTION Bilateral 10/2017    COLONOSCOPY  2004, 2009, 2014   [place]     HYSTEROSCOPY WITH D & C  12/07/2010   with resection of endometrial polyp   JOINT REPLACEMENT     KNEE ARTHROSCOPY Left 2008   lip biopsy     done at MD office Fri 11/22/13   MASS EXCISION Right 08/15/2013   Procedure: RIGHT INDEX EXCISION MASS ;  Surgeon: Tennis Must, MD;  Location: [facility];  Service: Orthopedics;  Laterality: Right;   NASAL SEPTUM SURGERY     OOPHORECTOMY Right 2000   REVERSE SHOULDER ARTHROPLASTY Left 12/11/2018   REVERSE SHOULDER ARTHROPLASTY Left 12/11/2018   Procedure: LEFT REVERSE SHOULDER ARTHROPLASTY;  Surgeon: Meredith Pel, MD;  Location: [facility];  Service: Orthopedics;  Laterality: Left;   SHOULDER ARTHROSCOPY WITH OPEN ROTATOR CUFF REPAIR AND DISTAL CLAVICLE ACROMINECTOMY Right 11/26/2013   Procedure: RIGHT SHOULDER ARTHROSCOPY WITH MINI OPEN ROTATOR CUFF REPAIR AND DISTAL CLAVICLE RESECTION, SUBACROMIAL DECOMPRESSION, POSSIBLE [facility] PATCH.;  Surgeon: [name], MD;  Location: [place];  Service: Orthopedics;  Laterality: Right;   TEE WITHOUT CARDIOVERSION N/A 02/16/2021   Procedure: TRANSESOPHAGEAL ECHOCARDIOGRAM (TEE);  Surgeon: Acie Fredrickson Wonda Cheng, MD;  Location: [facility] ENDOSCOPY;  Service: Cardiovascular;  Laterality: N/A;   TOTAL KNEE ARTHROPLASTY Right 03/21/2017   TOTAL KNEE ARTHROPLASTY Right 03/21/2017   Procedure: RIGHT TOTAL KNEE ARTHROPLASTY;  Surgeon: [name], MD;  Location: [place];  Service: Orthopedics;  Laterality: Right;   TUBAL LIGATION      Social History:  Social History   Socioeconomic History   Marital status: Married    Spouse name: Not on file   Number of children: 2   Years of education: 12   Highest education level: Not on file  Occupational History   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 She lives with her husband [name]). She has 1 daughter [name]), 1 son [name]) and 2 stepchildren ([place]). They have 3 dogs ([name], [place], [name]) and 3 cats ([place], [place], [place]). She enjoys watching movies and outdoors activities.      Exercise - none   Caffeine -  varies depending on the day. May have between 1-4 cups daily         Social Determinants of Health   Financial Resource Strain: Not on file  Food Insecurity: No Food Insecurity   Worried About [place] in the Last Year: Never true   Ran Out of Food in the Last Year: Never true  Transportation Needs: No Transportation Needs   Lack of Transportation (Medical): No   Lack of Transportation (Non-Medical): No  Physical Activity: Not on file  Stress: Not on file  Social Connections: Not on file  Intimate Partner Violence: Not on file    Family History:  Family History  Problem Relation Age of Onset   Hypertension Mother    Heart disease Mother    Diabetes Father    Hypertension Father    Heart disease Father    Stroke Father    Diabetes Maternal Aunt    Diabetes Paternal Grandmother    Hypertension Paternal Grandfather    Stroke Paternal Grandfather    Colon cancer Neg Hx    Esophageal cancer Neg Hx    Rectal cancer Neg Hx    Stomach cancer Neg Hx     Medications:   Current Outpatient Medications on File Prior to Visit  Medication Sig Dispense Refill   Ascorbic Acid (VITAMIN C) 1000 MG tablet Take 1,000 mg by mouth 2 (two)  times daily.     aspirin EC 81 MG EC tablet Take 1 tablet (81 mg total) by mouth daily. Swallow whole. 30 tablet 11   atenolol-chlorthalidone (TENORETIC) 50-25 MG tablet Take 1 tablet by mouth daily. 90 tablet 3   b complex vitamins tablet Take 1 tablet by mouth at bedtime.     Calcium Carbonate-Vitamin D (CALCIUM + D PO) Take 1 tablet by mouth at bedtime.     CHELATED MAGNESIUM PO Take 3 tablets by mouth every morning.     Cholecalciferol (D 5000) 5000 units capsule Take 5,000 Units by mouth 3 (three) times daily.     Coenzyme Q10 (COQ10) 100 MG CAPS Take 100 mg by mouth every evening.      diphenoxylate-atropine (LOMOTIL) 2.5-0.025 MG tablet TAKE 1 TABLET BY MOUTH FOUR TIMES A DAY AS NEEDED FOR DIARRHEA OR LOOSE STOOLS (Patient taking differently: Take 1 tablet by mouth 4 (four) times daily as needed for diarrhea or loose stools.) 80 tablet 2   Ferrous Sulfate (IRON) 325 (65 Fe) MG TABS Take 1 tablet (325 mg total) by mouth 2 (two) times daily. (Patient taking differently: Take 325 mg by mouth every morning.) 60 tablet 0   Fiber POWD Take 5 mLs by mouth 2 (two) times daily. Heather's Tummy - mix in water and drink     folic acid (FOLVITE) 353 MCG tablet Take 800 mcg by mouth every evening.      hyoscyamine (LEVSIN SL) 0.125 MG SL tablet Take 1-2 tablets by mouth every 4 hours as needed (Patient taking differently: Take 0.125 mg by mouth 2 (two) times daily as needed (stomach cramps).) 90 tablet 11   ketotifen (ZADITOR) 0.025 % ophthalmic solution Place 2 drops into both eyes 2 (two) times daily as needed (allergies).     lisinopril (ZESTRIL) 2.5 MG tablet TAKE 1 TABLET DAILY 90 tablet 0   Melatonin 3 MG TABS Take 3 mg by mouth at bedtime.      methocarbamol (ROBAXIN) 500 MG tablet TAKE 1 TABLET BY MOUTH EVERY 8 HOURS AS NEEDED FOR MUSCLE SPASMS (Patient  taking differently: Take 500 mg by mouth every 8 (eight) hours as needed for muscle spasms.) 90 tablet 3   Multiple Vitamin (MULTIVITAMIN WITH  MINERALS) TABS tablet Take 1 tablet by mouth at bedtime.     OVER THE COUNTER MEDICATION Take 5 drops by mouth at bedtime. Lemon Balm     OVER THE COUNTER MEDICATION Take 1 Dose by mouth at bedtime. Cats Claw liquid daily     OVER THE COUNTER MEDICATION Apply 1 application topically at bedtime. Magnesium Lotion     polyvinyl alcohol (LIQUIFILM TEARS) 1.4 % ophthalmic solution Place 1 drop into both eyes daily as needed for dry eyes.     potassium chloride SA (KLOR-CON M20) 20 MEQ tablet Take 1 tablet (20 mEq total) by mouth 2 (two) times daily. 180 tablet 3   Probiotic Product (PROBIOTIC DAILY PO) Take 1 capsule by mouth 2 (two) times daily.     simvastatin (ZOCOR) 20 MG tablet Take 1 tablet (20 mg total) by mouth daily. 90 tablet 3   Sodium Fluoride (SODIUM FLUORIDE 5000 PPM) 1.1 % PSTE Place 1 application onto teeth at bedtime.     Zinc Sulfate (ZINC 15 PO) Take 10 drops by mouth at bedtime. Liquid Blend 10 drops equal 15 mg     No current facility-administered medications on file prior to visit.    Allergies:   Allergies  Allergen Reactions   Eggs Or Egg-Derived Products Other (See Comments)    GI UPSET, JOINT PAIN, DIFF. SWALLOWING   Etodolac Other (See Comments)    ABD. CRAMPING   Fish-Derived Products Other (See Comments)    GI UPSET, JOINT PAIN, DIFF. SWALLOWING    Omeprazole Other (See Comments)    ABD. CRAMPING   Pineapple Other (See Comments)    GI UPSET, JOINT PAIN, DIFF. SWALLOWING   Gluten Meal Diarrhea    GI upset      OBJECTIVE:  Physical Exam  Vitals:   04/07/21 1409  BP: 117/80  Pulse: 78  Weight: 164 lb (74.4 kg)  Height: 5' 2"  (1.575 m)   Body mass index is 30 kg/m. No results found.  Post stroke PHQ 2/9 Depression screen PHQ 2/9 03/12/2021  Decreased Interest 0  Down, Depressed, Hopeless 0  PHQ - 2 Score 0  Some recent data might be hidden     General: well developed, well nourished, very pleasant elderly Caucasian female, seated, in no  evident distress Head: head normocephalic and atraumatic.   Neck: supple with no carotid or supraclavicular bruits Cardiovascular: regular rate and rhythm, no murmurs Musculoskeletal: no deformity Skin:  no rash/petichiae Vascular:  Normal pulses all extremities   Neurologic Exam Mental Status: Awake and fully alert.  Fluent speech and language.  Oriented to place and time. Recent and remote memory intact. Attention span, concentration and fund of knowledge appropriate. Mood and affect appropriate.  Cranial Nerves: Fundoscopic exam reveals sharp disc margins. Pupils equal, briskly reactive to light. Extraocular movements full without nystagmus. Visual fields full to confrontation. Hearing intact. Facial sensation intact. Face, tongue, palate moves normally and symmetrically.  Motor: Normal bulk and tone. Normal strength in all tested extremity muscles Sensory.: intact to touch , pinprick , position and vibratory sensation.  Coordination: Rapid alternating movements normal in all extremities except slightly decreased right hand dexterity. Finger-to-nose slight incoordination RUE and heel-to-shin performed accurately bilaterally. Gait and Station: Arises from chair without difficulty. Stance is normal. Gait demonstrates normal stride length and balance without use of AD. Tandem walk  and heel toe mild difficulty.  Reflexes: 1+ and symmetric. Toes downgoing.     NIHSS  1 Modified Rankin  2-3      ASSESSMENT: [name] is a 70 y.o. year old female with recent left BG hemorrhagic stroke on 02/13/2021 secondary to unknown source as well as evidence of remote age right occipital hemorrhagic infarct and nonhemorrhagic right superior temporal and left cerebellar embolic infarcts noted on imaging. Vascular risk factors include HTN, HLD, advanced age and prior strokes on imaging.      PLAN:  L BG hemorrhagic stroke:  Residual deficit: mild RUE decreased dexterity and incoordination.  Continue OT for likely ongoing recovery Indeterminate etiology per Dr. Leonie Man, low suspicion for hypertensive etiology as BP on lower end of normal.  Repeat MR brain w/wo contrast to rule out any underlying structures 30-day cardiac event monitor negative - discuss ILR with cardiology on 1/9 Continue aspirin 81 mg daily  and simvastatin 40 mg daily for secondary stroke prevention.   Discussed secondary stroke prevention measures and importance of close PCP follow up for aggressive stroke risk factor management. I have gone over the pathophysiology of stroke, warning signs and symptoms, risk factors and their management in some detail with instructions to go to the closest emergency room for symptoms of concern. HTN: BP goal <130/90.  Stable on current regimen per PCP HLD: LDL goal <70. Recent LDL 122.  Continue simvastatin 20 mg daily per PCP. Repeat lipid panel today. If remains elevated, will recommend increasing simvastatin dosage At risk for sleep apnea: Referral placed to [facility]     Follow up in 5 months or call earlier if needed   CC:  [place] provider: Dr. Leonie Man PCP: Hoyt Koch, MD    I spent 54 minutes of face-to-face and non-face-to-face time with patient.  This included previsit chart review including review of recent hospitalization, lab review, study review, order entry, electronic health record documentation, patient education regarding recent stroke including possible etiologies, secondary stroke prevention measures and importance of managing stroke risk factors, residual deficits and typical recovery time and answered all other questions to patient satisfaction  Frann Rider, AGNP-BC  [facility] [address] [place], [place] [address]  Phone [phone] Fax [phone] Note: This document was prepared with digital dictation and possible smart phrase technology. Any transcriptional errors that result from this process  are unintentional.

## 2021-04-07 NOTE — Patient Instructions (Addendum)
Continue working with therapies for hopeful ongoing recovery   Follow up with cardiology regarding heart monitor  You will be called to be evaluated for possible underlying sleep apnea  We will repeat cholesterol levels today   You will be called to repeat an MRI of your brain to look for possible underlying causes of your recent bleed  Continue aspirin 81 mg daily  and simvastatin 32m daily  for secondary stroke prevention  Continue to follow up with PCP regarding cholesterol and blood pressure management  Maintain strict control of hypertension with blood pressure goal below 130/90 and cholesterol with LDL cholesterol (bad cholesterol) goal below 70 mg/dL.   Signs of a Stroke? Follow the BEFAST method:  Balance Watch for a sudden loss of balance, trouble with coordination or vertigo Eyes Is there a sudden loss of vision in one or both eyes? Or double vision?  Face: Ask the person to smile. Does one side of the face droop or is it numb?  Arms: Ask the person to raise both arms. Does one arm drift downward? Is there weakness or numbness of a leg? Speech: Ask the person to repeat a simple phrase. Does the speech sound slurred/strange? Is the person confused ? Time: If you observe any of these signs, call 911.      Followup in the future with me in 5 months or call earlier if needed       Thank you for coming [facility]. I hope we have been able to provide you high quality care today.  You may receive a patient satisfaction survey over the next few weeks. We would appreciate your feedback and comments so that we may continue to improve ourselves and the health of our patients.

## 2021-04-07 NOTE — Telephone Encounter (Signed)
Medicare/aetna order sent to [facility], they will obtain the auth for aetna and reach out to the patient to schedule.

## 2021-04-08 ENCOUNTER — Ambulatory Visit: Payer: Medicare Other

## 2021-04-08 ENCOUNTER — Other Ambulatory Visit: Payer: Self-pay

## 2021-04-08 DIAGNOSIS — R278 Other lack of coordination: Secondary | ICD-10-CM | POA: Diagnosis not present

## 2021-04-08 DIAGNOSIS — M6281 Muscle weakness (generalized): Secondary | ICD-10-CM

## 2021-04-08 DIAGNOSIS — R482 Apraxia: Secondary | ICD-10-CM | POA: Diagnosis not present

## 2021-04-08 LAB — LIPID PANEL
Chol/HDL Ratio: 3.1 ratio (ref 0.0–4.4)
Cholesterol, Total: 136 mg/dL (ref 100–199)
HDL: 44 mg/dL (ref >39)
LDL Chol Calc (NIH): 74 mg/dL (ref 0–99)
Triglycerides: 97 mg/dL (ref 0–149)
VLDL Cholesterol Cal: 18 mg/dL (ref 5–40)

## 2021-04-08 NOTE — Therapy (Signed)
[place] MAIN [facility] SERVICES [address] [place], [place], [address] Phone: [phone]   Fax:  [phone]  Occupational Therapy Treatment  Patient Details  Name: [name] MRN: [id number] Date of Birth: Oct 03, 1950 Referring Provider (OT): Dr. Antony Contras   Encounter Date: 04/08/2021   OT End of Session - 04/08/21 1606     Visit Number 12    Number of Visits 21    Date for OT Re-Evaluation 05/08/21    Authorization Time Period Reporting period beginning 04/01/21    OT Start Time 1300    OT Stop Time 1345    OT Time Calculation (min) 45 min    Activity Tolerance Patient tolerated treatment well    Behavior During Therapy [facility] for tasks assessed/performed             Past Medical History:  Diagnosis Date   Arthritis    back, fingers with joint pain and swelling.  chronic back pain   Cataract    Chronic back pain    arthritis    Diverticulosis    Elevated cholesterol    takes Niacin daily and Simvastatin   GERD (gastroesophageal reflux disease) 06/2004   non-specific gastritis on EGD 06/2004   Headache(784.0)    occasionally   History of colon polyps 2004, 2009   2004:adenomatous. 2009 hyperplastic.    History of hiatal hernia    Hypertension    takes Tenoretic and Lisinopril daily   Mucoid cyst of joint 08/2013   right index finger   Osteopenia 01/2014   T score -1.1 FRAX 14%/0.5%. Stable from prior DEXA   PONV (postoperative nausea and vomiting)    Rotator cuff arthropathy    Left   Seasonal allergies    Stroke ([place]) 1998   x 2 - mild left-sided weakness   Urge incontinence    Uterine prolapse     Past Surgical History:  Procedure Laterality Date   BACK SURGERY     x 2   BELPHAROPTOSIS REPAIR Bilateral 12/14/2017   BUBBLE STUDY  02/16/2021   Procedure: BUBBLE STUDY;  Surgeon: Thayer Headings, MD;  Location: [facility] ENDOSCOPY;  Service: Cardiovascular;;   CATARACT EXTRACTION     CATARACT EXTRACTION  Bilateral 10/2017   COLONOSCOPY  2004, 2009, 2014   [place]     HYSTEROSCOPY WITH D & C  12/07/2010   with resection of endometrial polyp   JOINT REPLACEMENT     KNEE ARTHROSCOPY Left 2008   lip biopsy     done at MD office Fri 11/22/13   MASS EXCISION Right 08/15/2013   Procedure: RIGHT INDEX EXCISION MASS ;  Surgeon: Tennis Must, MD;  Location: [place];  Service: Orthopedics;  Laterality: Right;   NASAL SEPTUM SURGERY     OOPHORECTOMY Right 2000   REVERSE SHOULDER ARTHROPLASTY Left 12/11/2018   REVERSE SHOULDER ARTHROPLASTY Left 12/11/2018   Procedure: LEFT REVERSE SHOULDER ARTHROPLASTY;  Surgeon: Meredith Pel, MD;  Location: [place];  Service: Orthopedics;  Laterality: Left;   SHOULDER ARTHROSCOPY WITH OPEN ROTATOR CUFF REPAIR AND DISTAL CLAVICLE ACROMINECTOMY Right 11/26/2013   Procedure: RIGHT SHOULDER ARTHROSCOPY WITH MINI OPEN ROTATOR CUFF REPAIR AND DISTAL CLAVICLE RESECTION, SUBACROMIAL DECOMPRESSION, POSSIBLE [facility] PATCH.;  Surgeon: [name], MD;  Location: [place];  Service: Orthopedics;  Laterality: Right;   TEE WITHOUT CARDIOVERSION N/A 02/16/2021   Procedure: TRANSESOPHAGEAL ECHOCARDIOGRAM (  TEE);  Surgeon: Acie Fredrickson Wonda Cheng, MD;  Location: [facility] ENDOSCOPY;  Service: Cardiovascular;  Laterality: N/A;   TOTAL KNEE ARTHROPLASTY Right 03/21/2017   TOTAL KNEE ARTHROPLASTY Right 03/21/2017   Procedure: RIGHT TOTAL KNEE ARTHROPLASTY;  Surgeon: [name], MD;  Location: [place];  Service: Orthopedics;  Laterality: Right;   TUBAL LIGATION      There were no vitals filed for this visit.   Subjective Assessment - 04/08/21 1316     Subjective  "I had a neurology follow up yesterday and I go to the cardiologist next week."    Pertinent History Recent CVA on 02/13/21 affecting R side, hx of CVA x2 in 1998 affecting L side, hx of R TKA, L TSA, R rotator cuff repair    Limitations weakness, fatigue,  impaired [facility]    Patient Stated Goals "Improve my strength, endurance, and coordination."    Currently in Pain? Yes    Pain Score 6     Pain Location Knee    Pain Orientation Left    Pain Descriptors / Indicators Aching    Pain Type Chronic pain    Pain Radiating Towards none    Pain Onset More than a month ago    Pain Frequency Constant    Aggravating Factors  standing, walking    Pain Relieving Factors rest, meds, heat    Effect of Pain on Daily Activities general discomfort with activity in neck, shoulders, and L knee from arthritis    Multiple Pain Sites Yes    Pain Score 6    Pain Location Shoulder    Pain Orientation Right;Left    Pain Descriptors / Indicators Aching    Pain Type Chronic pain    Pain Onset More than a month ago    Pain Frequency Constant    Aggravating Factors  arthritic pain    Pain Relieving Factors rest, heat, pain medications    Effect of Pain on Daily Activities pain with reaching overhead            Occupational Therapy Treatment: Neuro re-ed: Facilitated small item manipulation with construction of Purdue pegboard pieces using L hand, deconstructed with R hand.  Facilitated reaching toward a target clipping clothespins, all colors to vertical target, 1 trial each hand for lateral pinch, 1 trial each hand for 3 point pinch, challenging reach above shoulder level.  Therapeutic Exercise: Pt completed hand gripper set at min resistance with 1 red band to complete grip strengthening x5 sets 10 reps each hand, sets completed throughout duration of session to allow rest breaks.  Shoulder flexion stretch overhead and ER stretch to top of head with yard stick in prep for strengthening.  Performed BUE strengthening with use of 2# dumbbell for R/L bicep, and 1# for R/L shoulder flex/abd, 2 sets 10 reps each.  Min guard for shoulder strengthening to maintain form and technique.  Performed shoulder elevation/depression, protraction/retraction/circles x2 sets 10  reps each.    Response to Treatment: See Plan/clinical impression below.     OT Education - 04/08/21 1604     Education Details HEP progression    Person(s) Educated Patient    Methods Explanation;Verbal cues;Demonstration;Tactile cues    Comprehension Verbalized understanding;Returned demonstration;Verbal cues required;Need further instruction              OT Short Term Goals - 03/30/21 0810       OT SHORT TERM GOAL #1   Title Pt will perform BUE HEP independently for increasing strenth and  coordination for self care tasks.    Baseline Eval: initiated theraputty exercises at eval; pt is indep with theraputty/additions with shoulder exercises requires vc    Time 3    Period Weeks    Status On-going    Target Date 04/21/21               OT Long Term Goals - 03/30/21 1158       OT LONG TERM GOAL #1   Title Pt will improve FOTO score to 80 or better to indicate increased functional performance.    Baseline Eval: FOTO 72 (questions answered related to RUE); 03/30/21 FOTO 54 (questions answered related to LUE)    Time 5    Period Weeks    Status On-going    Target Date 05/08/21      OT LONG TERM GOAL #2   Title Pt will improve bilat hand coordination to ease manipulation and efficiency with managing clothing fasteners.    Baseline Eval: extra time and effort to manage small buttons (R 9 hole peg test 30 sec, L 38 sec); 03/30/21: R 9 hole 27 sec, L 29, improving with clothing fasteners (extra time)    Time 5    Period Weeks    Status On-going    Target Date 05/08/21      OT LONG TERM GOAL #3   Title Pt will increase bilat hand strength by 5 or more lbs for improving grasp and manipulation of ADL supplies.    Baseline Eval: R grip 29 lbs, L grip 24 lbs; 03/2021: R grip 35 lbs, L grip 31 lbs    Time 5    Period Weeks    Status On-going    Target Date 05/08/21      OT LONG TERM GOAL #4   Title Pt will improve GMC bilaterally for improved accuracy and  efficiency when reaching for ADL supplies.    Baseline Eval: mild BUE ataxia; Good accuracy with RUE reaching, mild ataxia LUE and difficulty reaching above shoulder level for ADL supplies in either arm    Time 5    Period Weeks    Status On-going    Target Date 05/08/21      OT LONG TERM GOAL #5   Title Pt will increase activity tolerance for IADLs and community mobility indicated by RPE of 2 or better with these activities.    Baseline Eval: RPE of 5 with IADLs and community mobility; 03/30/2021: RPE 4    Time 5    Period Weeks    Status On-going    Target Date 05/08/21      Long Term Additional Goals   Additional Long Term Goals Yes      OT LONG TERM GOAL #6   Title Pt will increase bilat shoulder flex/abd strength to ease over head reach for ADL supplies.    Baseline 03/30/21: R/L shoulder flex/abd 4-/5    Time 5    Period Weeks    Status New    Target Date 05/08/21              Plan - 04/08/21 1616     Clinical Impression Statement Pt reports L hand feels a little shakier today.  Also reported only mild soreness after performing new shoulder exercises last session.  Pt will continue to benefit from skilled OT for increasing BUE strength and coordination in order to improve indep and efficiency with self care tasks.    OT Occupational Profile and History Detailed  Assessment- Review of Records and additional review of physical, cognitive, psychosocial history related to current functional performance    Occupational performance deficits (Please refer to evaluation for details): ADL's;IADL's    Body Structure / Function / Physical Skills ADL;Dexterity;Strength;Coordination;FMC;Body mechanics;Endurance;UE functional use;GMC    Rehab Potential Excellent    Clinical Decision Making Several treatment options, min-mod task modification necessary    Comorbidities Affecting Occupational Performance: May have comorbidities impacting occupational performance    Modification or  Assistance to Complete Evaluation  No modification of tasks or assist necessary to complete eval    OT Frequency 2x / week    OT Duration 6 weeks    OT Treatment/Interventions Self-care/ADL training;Therapeutic exercise;DME and/or AE instruction;Neuromuscular education;Manual Therapy;Moist Heat;Energy conservation;Therapeutic activities;Patient/family education;Passive range of motion    Plan OT to address strength and coordination deficits bilaterally which impact self care performance    OT Home Exercise Plan issued pink theraputty at eval and instructed in exercises/handout issued    Recommended Other Services N/A    Consulted and Agree with Plan of Care Patient             Patient will benefit from skilled therapeutic intervention in order to improve the following deficits and impairments:   Body Structure / Function / Physical Skills: ADL, Dexterity, Strength, Coordination, FMC, Body mechanics, Endurance, UE functional use, GMC       Visit Diagnosis: Muscle weakness (generalized)  Other lack of coordination    Problem List Patient Active Problem List   Diagnosis Date Noted   ICH (intracerebral hemorrhage) ([place]) 02/13/2021   Iron deficiency 10/07/2020   Aortic atherosclerosis ([place]) 03/17/2020   Lung nodule seen on imaging study 02/24/2020   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   Chronic left shoulder pain 08/23/2018   Chronic diarrhea 12/07/2014   Segmental colitis with rectal bleeding ([place]) 09/12/2014   Routine general medical examination at a health care facility 06/05/2014   Overweight 06/13/2011   Hyperlipidemia 02/17/2007   Essential hypertension 01/08/2007   Osteoarthritis 01/08/2007   Leta Speller, MS, OTR/L  Darleene Cleaver, OT 04/08/2021, 4:17 PM  [place] MAIN [facility] SERVICES [address] [place], [place], [address] Phone: [phone]   Fax:  [phone]  Name: [name] MRN: [id number] Date  of Birth: 1951-01-14

## 2021-04-10 DIAGNOSIS — I10 Essential (primary) hypertension: Secondary | ICD-10-CM | POA: Diagnosis not present

## 2021-04-10 DIAGNOSIS — E782 Mixed hyperlipidemia: Secondary | ICD-10-CM

## 2021-04-10 DIAGNOSIS — I610 Nontraumatic intracerebral hemorrhage in hemisphere, subcortical: Secondary | ICD-10-CM | POA: Diagnosis not present

## 2021-04-12 NOTE — Progress Notes (Signed)
I agree with the above plan 

## 2021-04-14 ENCOUNTER — Other Ambulatory Visit: Payer: Self-pay

## 2021-04-14 ENCOUNTER — Ambulatory Visit: Payer: Medicare Other | Attending: Neurology

## 2021-04-14 ENCOUNTER — Ambulatory Visit
Admission: RE | Admit: 2021-04-14 | Discharge: 2021-04-14 | Disposition: A | Discharge status: Home / Self Care | Payer: Medicare Other | Source: Ambulatory Visit | Attending: Adult Health | Admitting: Adult Health

## 2021-04-14 ENCOUNTER — Ambulatory Visit: Payer: Medicare Other

## 2021-04-14 DIAGNOSIS — M25562 Pain in left knee: Secondary | ICD-10-CM | POA: Diagnosis not present

## 2021-04-14 DIAGNOSIS — R2689 Other abnormalities of gait and mobility: Secondary | ICD-10-CM | POA: Insufficient documentation

## 2021-04-14 DIAGNOSIS — M25612 Stiffness of left shoulder, not elsewhere classified: Secondary | ICD-10-CM | POA: Insufficient documentation

## 2021-04-14 DIAGNOSIS — R2681 Unsteadiness on feet: Secondary | ICD-10-CM

## 2021-04-14 DIAGNOSIS — R278 Other lack of coordination: Secondary | ICD-10-CM | POA: Insufficient documentation

## 2021-04-14 DIAGNOSIS — M79605 Pain in left leg: Secondary | ICD-10-CM | POA: Insufficient documentation

## 2021-04-14 DIAGNOSIS — R262 Difficulty in walking, not elsewhere classified: Secondary | ICD-10-CM

## 2021-04-14 DIAGNOSIS — I610 Nontraumatic intracerebral hemorrhage in hemisphere, subcortical: Secondary | ICD-10-CM | POA: Diagnosis not present

## 2021-04-14 DIAGNOSIS — I639 Cerebral infarction, unspecified: Secondary | ICD-10-CM | POA: Diagnosis not present

## 2021-04-14 DIAGNOSIS — M6281 Muscle weakness (generalized): Secondary | ICD-10-CM | POA: Diagnosis not present

## 2021-04-14 MED ORDER — GADOBENATE DIMEGLUMINE 529 MG/ML IV SOLN
15.0000 mL | Freq: Once | INTRAVENOUS | Status: AC | PRN
  Administered 2021-04-14: 15 mL via INTRAVENOUS

## 2021-04-14 NOTE — Therapy (Signed)
[place] MAIN [facility] SERVICES [address] [place], [place], [address] Phone: [phone]   Fax:  [phone]  Physical Therapy Evaluation  Patient Details  Name: [name] MRN: [id number] Date of Birth: [birth year] Referring Provider (PT): Antony Contras, MD   Encounter Date: 04/14/2021   PT End of Session - 04/14/21 1633     Visit Number 1    Number of Visits 13    Date for PT Re-Evaluation 05/26/21   Pt agreeable to 6-week period   Authorization Type Medicare primary, [employer] secondary    Authorization Time Period 04/14/21-05/26/21    Progress Note Due on Visit 10    PT Start Time 1047    PT Stop Time 1143    PT Time Calculation (min) 56 min    Equipment Utilized During Treatment Gait belt    Activity Tolerance Patient tolerated treatment well;Patient limited by fatigue;Patient limited by pain    Behavior During Therapy WFL for tasks assessed/performed             Past Medical History:  Diagnosis Date   Arthritis    back, fingers with joint pain and swelling.  chronic back pain   Cataract    Chronic back pain    arthritis    Diverticulosis    Elevated cholesterol    takes Niacin daily and Simvastatin   GERD (gastroesophageal reflux disease) 06/2004   non-specific gastritis on EGD 06/2004   Headache(784.0)    occasionally   History of colon polyps 2004, 2009   2004:adenomatous. 2009 hyperplastic.    History of hiatal hernia    Hypertension    takes Tenoretic and Lisinopril daily   Mucoid cyst of joint 08/2013   right index finger   Osteopenia 01/2014   T score -1.1 FRAX 14%/0.5%. Stable from prior DEXA   PONV (postoperative nausea and vomiting)    Rotator cuff arthropathy    Left   Seasonal allergies    Stroke ([place]) 1998   x 2 - mild left-sided weakness   Urge incontinence    Uterine prolapse     Past Surgical History:  Procedure Laterality Date   BACK SURGERY     x 2   BELPHAROPTOSIS REPAIR Bilateral  12/14/2017   BUBBLE STUDY  02/16/2021   Procedure: BUBBLE STUDY;  Surgeon: Thayer Headings, MD;  Location: [facility] ENDOSCOPY;  Service: Cardiovascular;;   CATARACT EXTRACTION     CATARACT EXTRACTION Bilateral 10/2017   COLONOSCOPY  2004, 2009, 2014   [place]     HYSTEROSCOPY WITH D & C  12/07/2010   with resection of endometrial polyp   JOINT REPLACEMENT     KNEE ARTHROSCOPY Left 2008   lip biopsy     done at MD office Fri 11/22/13   MASS EXCISION Right 08/15/2013   Procedure: RIGHT INDEX EXCISION MASS ;  Surgeon: Tennis Must, MD;  Location: [place];  Service: Orthopedics;  Laterality: Right;   NASAL SEPTUM SURGERY     OOPHORECTOMY Right 2000   REVERSE SHOULDER ARTHROPLASTY Left 12/11/2018   REVERSE SHOULDER ARTHROPLASTY Left 12/11/2018   Procedure: LEFT REVERSE SHOULDER ARTHROPLASTY;  Surgeon: Meredith Pel, MD;  Location: [place];  Service: Orthopedics;  Laterality: Left;   SHOULDER ARTHROSCOPY WITH OPEN ROTATOR CUFF REPAIR AND DISTAL CLAVICLE ACROMINECTOMY Right 11/26/2013   Procedure: RIGHT SHOULDER ARTHROSCOPY WITH MINI OPEN ROTATOR CUFF REPAIR AND  DISTAL CLAVICLE RESECTION, SUBACROMIAL DECOMPRESSION, POSSIBLE [facility] PATCH.;  Surgeon: [name], MD;  Location: [place];  Service: Orthopedics;  Laterality: Right;   TEE WITHOUT CARDIOVERSION N/A 02/16/2021   Procedure: TRANSESOPHAGEAL ECHOCARDIOGRAM (TEE);  Surgeon: Acie Fredrickson Wonda Cheng, MD;  Location: [facility] ENDOSCOPY;  Service: Cardiovascular;  Laterality: N/A;   TOTAL KNEE ARTHROPLASTY Right 03/21/2017   TOTAL KNEE ARTHROPLASTY Right 03/21/2017   Procedure: RIGHT TOTAL KNEE ARTHROPLASTY;  Surgeon: [name], MD;  Location: [place];  Service: Orthopedics;  Laterality: Right;   TUBAL LIGATION      There were no vitals filed for this visit.    Subjective Assessment - 04/14/21 1052     Subjective Milus Height is here for PT evaluation s/p CVA. She has had OT  here for 4 weeks, but not started PT yet due to wait list.    Pertinent History [name] is a 35yoF who presents to OPPT on 04/14/21 for evaluation.   Pt evaluated by PT at [facility] on 11/7 while admitted for acute unsteadiness, Rt facial droop, imaging revealing of acute Lt CVA, remote infarcts seen bilat, pt scored 13/24 DG on 02/15/21. PT has been working with [place] here since 02/25/21.  PMH: HTN, Rt RCR, Lt TSA, bilat TKA.    How long can you sit comfortably? unlimited    How long can you stand comfortably? Can stand through all tasks, but is easily fatigued thereafter    How long can you walk comfortably? A regular grocery trip    Currently in Pain? Yes    Pain Score 4    Left knee adn Left shoulder both 4/10 today               [facility] PT Assessment - 04/14/21 0001       Assessment   Medical Diagnosis CVA    Referring Provider (PT) Antony Contras, MD    Onset Date/Surgical Date 02/13/21    Hand Dominance Right    Prior Therapy acute care PT/OT, OPOT here   previously seen for OPPT sports for other diagnoses.     Precautions   Precautions None      Restrictions   Weight Bearing Restrictions No      Balance Screen   Has the patient fallen in the past 6 months Yes    How many times? 1   fell backward trying to break up dog fight at home (no injury sustained)   Has the patient had a decrease in activity level because of a fear of falling?  No    Is the patient reluctant to leave their home because of a fear of falling?  No      Home Environment   Living Environment Private residence    Living Arrangements Spouse/significant other    Type of [place] Multi-level   visit area in basement; bonus room is 1 full flight up   [place] - single point;Walker - 2 wheels;Walker - 4 wheels;Bedside commode;Shower seat;Grab bars - toilet    Additional Comments intermittent use of SPC for stairs or uneven ground   RUE     Prior Function   Level of Independence  Independent    Vocation Retired    [employer] retired from [place] [employer]    Leisure likes movies, pets (has 5 dogs)      Observation/Other Assessments   Focus on Therapeutic Outcomes ([place])  33 (initial)      Transfers  Five time sit to stand comments  Able to stand only 1x hand free      Ambulation/Gait   Ambulation Distance (Feet) 950 Feet    Assistive device --   non   Gait Pattern Within Functional Limits   at 5:00 onset high velocity Rt side compensated trendelenburg.   Gait Comments generally good symmetry, good toe clearance; reduced trunk rotation bilat;   c/o cramping pain left flank/hip/thigh after first 229f     Standardized Balance Assessment   Standardized Balance Assessment Mini-BESTest;Timed Up and Go Test      Mini-BESTest   Sit To Stand Normal: Comes to stand without use of hands and stabilizes independently.    Rise to Toes Normal: Stable for 3 s with maximum height.    Stand on one leg (left) Moderate: < 20 s    Stand on one leg (right) Normal: 20 s.    Stand on one leg - lowest score 1    Compensatory Stepping Correction - Forward Normal: Recovers independently with a single, large step (second realignement is allowed).    Compensatory Stepping Correction - Backward Normal: Recovers independently with a single, large step    Compensatory Stepping Correction - Left Lateral Moderate: Several steps to recover equilibrium    Compensatory Stepping Correction - Right Lateral Moderate: Several steps to recover equilibrium    Stepping Corredtion Lateral - lowest score 1    Stance - Feet together, eyes open, firm surface  Normal: 30s    Stance - Feet together, eyes closed, foam surface  Normal: 30s    Incline - Eyes Closed Normal: Stands independently 30s and aligns with gravity    Change in Gait Speed Normal: Significantly changes walkling speed without imbalance    Walk with head turns - Horizontal Normal: performs head turns with no change in gait  speed and good balance    Walk with pivot turns Normal: Turns with feet close FAST (< 3 steps) with good balance.    Step over obstacles Normal: Able to step over box with minimal change of gait speed and with good balance.    Timed UP & GO with Dual Task Moderate: Dual Task affects either counting OR walking (>10%) when compared to the TUG without Dual Task.   12.12 TUG; 14.53cTUG (girls names ABCs)   Mini-BEST total score 25      Timed Up and Go Test   Normal TUG (seconds) 12.12   no device   Cognitive TUG (seconds) 14.53   naming ABCs of girl's names     High Level Balance   High Level Balance Activites Other (comment)   SLS: >20sec Rt; Left side 4 trials (3sec, 2sec, 12sec, 3sec)                       Objective measurements completed on examination: See above findings.                  PT Short Term Goals - 04/14/21 1637       PT SHORT TERM GOAL #1   Title Pt will be independent with HEP in order to improve strength and endurance for mobility in community.    Baseline maxed out with grocery shopping    Time 2    Period Weeks    Target Date 04/28/21               PT Long Term Goals - 04/14/21 1638  PT LONG TERM GOAL #1   Title Pt to improve FOTO survey score from 55 to >60 to represent improved perception of facility in basic movement.    Baseline FOTO 55    Time 6    Period Weeks    Status New    Target Date 05/26/21      PT LONG TERM GOAL #2   Title Pt to demonstrate improved Left SLSL >20 sec without UE support.    Baseline 04/14/21: >20sec Rt; <12sec Left.    Time 6    Period Weeks    Status New    Target Date 05/26/21      PT LONG TERM GOAL #3   Title Pt to increase 6MWT from 965f to >13014fto improve ergonomic and tolerance in community mobility for IADL.    Baseline 95069f  Time 6    Period Weeks    Status New    Target Date 05/26/21      PT LONG TERM GOAL #4   Title Pt to demonstrate ability to transition from  floor to lying down to floor.    Baseline Eval: pt struggles with getting off floor due to acute on chronic DJD and weakness in 3 limbs.    Time 6    Period Weeks    Status New    Target Date 05/26/21                    Plan - 04/14/21 1635     Clinical Impression Statement Pt evaluated several weeks s/p acute CVA. Pt reports most perceptible subjective impairment and deficits from CVA- pertaining to gross motor function, balance, mobility- have largely resolved. However, pt does endorse continued generalized weakness, decreased activity tolerance, increased fatigability since hospitalization. This decline in physical ability has been slow to improve since returning home. In examination pt has no clear signs of asymmetry in legs regarding strength, motor control, proprioception, however pt does show limitations that are most likely exacerbation of chronic DJD of BLE. Pt scores well on min BestTest (above age-matched norm 21), but does show decreased performance in Left SLS portion. Pt has below age-matched population norms in 6MWT, ability to rise from a chair (including TUG tests) and expresses concerns regarding inability to rise from the floor at home. Pt will benefit from skilled PT intervention to maximize safety and independence in basic mobility for ADL and to improve activity tolerance with AMB to return to PLOF in IADL.    Personal Factors and Comorbidities Age;Behavior Pattern;Fitness;Past/Current Experience    Examination-Activity Limitations Locomotion Level;Carry;Stairs;Stand    Examination-Participation Restrictions Driving    Stability/Clinical Decision Making Stable/Uncomplicated    Clinical Decision Making Moderate    Rehab Potential Good    PT Frequency 2x / week    PT Duration 6 weeks    PT Treatment/Interventions ADLs/Self Care Home Management;Electrical Stimulation;Therapeutic activities;Passive range of motion;Manual techniques;Patient/family education;Therapeutic  exercise;Iontophoresis 4mg20m Dexamethasone;Moist Heat;Traction;Cryotherapy;Ultrasound;Functional mobility training;Neuromuscular re-education;Balance training;Dry needling;Joint Manipulations;Stair training;Gait training;DME Instruction    PT Next Visit Plan MMT legs, HEP set up; progress endurance with overground AMB;    PT Home Exercise Plan pending    Consulted and Agree with Plan of Care Patient             Patient will benefit from skilled therapeutic intervention in order to improve the following deficits and impairments:  Decreased endurance, Decreased strength, Decreased coordination, Improper body mechanics, Cardiopulmonary status limiting activity, Decreased activity tolerance, Decreased balance, Decreased  mobility, Postural dysfunction  Visit Diagnosis: Difficulty in walking, not elsewhere classified - Plan: PT plan of care cert/re-cert  Muscle weakness (generalized) - Plan: PT plan of care cert/re-cert  Unsteadiness on feet - Plan: PT plan of care cert/re-cert     Problem List Patient Active Problem List   Diagnosis Date Noted   ICH (intracerebral hemorrhage) ([place]) 02/13/2021   Iron deficiency 10/07/2020   Aortic atherosclerosis ([place]) 03/17/2020   Lung nodule seen on imaging study 02/24/2020   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   Chronic left shoulder pain 08/23/2018   Chronic diarrhea 12/07/2014   Segmental colitis with rectal bleeding ([place]) 09/12/2014   Routine general medical examination at a health care facility 06/05/2014   Overweight 06/13/2011   Hyperlipidemia 02/17/2007   Essential hypertension 01/08/2007   Osteoarthritis 01/08/2007   4:45 PM, 04/14/21 Etta Grandchild, PT, DPT Physical Therapist - [facility]  Outpatient Physical Therapy- [place] [phone]     Section, PT 04/14/2021, 4:45 PM  [place] MAIN [facility] SERVICES [address] [place],  [place], [address] Phone: [phone]   Fax:  [phone]  Name: MYRKA SYLVA MRN: [id number] Date of Birth: 1950-09-03

## 2021-04-15 NOTE — Therapy (Signed)
[place] MAIN [facility] SERVICES [address] [place], [place], [address] Phone: [phone]   Fax:  [phone]  Occupational Therapy Treatment  Patient Details  Name: [name] MRN: [id number] Date of Birth: [birth year] Referring Provider (OT): Dr. Antony Contras   Encounter Date: 04/14/2021   OT End of Session - 04/15/21 0835     Visit Number 13    Number of Visits 21    Date for OT Re-Evaluation 05/08/21    Authorization Time Period Reporting period beginning 04/01/21    OT Start Time 1000    OT Stop Time 1045    OT Time Calculation (min) 45 min    Activity Tolerance Patient tolerated treatment well    Behavior During Therapy [facility] for tasks assessed/performed             Past Medical History:  Diagnosis Date   Arthritis    back, fingers with joint pain and swelling.  chronic back pain   Cataract    Chronic back pain    arthritis    Diverticulosis    Elevated cholesterol    takes Niacin daily and Simvastatin   GERD (gastroesophageal reflux disease) 06/2004   non-specific gastritis on EGD 06/2004   Headache(784.0)    occasionally   History of colon polyps 2004, 2009   2004:adenomatous. 2009 hyperplastic.    History of hiatal hernia    Hypertension    takes Tenoretic and Lisinopril daily   Mucoid cyst of joint 08/2013   right index finger   Osteopenia 01/2014   T score -1.1 FRAX 14%/0.5%. Stable from prior DEXA   PONV (postoperative nausea and vomiting)    Rotator cuff arthropathy    Left   Seasonal allergies    Stroke ([place]) 1998   x 2 - mild left-sided weakness   Urge incontinence    Uterine prolapse     Past Surgical History:  Procedure Laterality Date   BACK SURGERY     x 2   BELPHAROPTOSIS REPAIR Bilateral 12/14/2017   BUBBLE STUDY  02/16/2021   Procedure: BUBBLE STUDY;  Surgeon: Thayer Headings, MD;  Location: [facility] ENDOSCOPY;  Service: Cardiovascular;;   CATARACT EXTRACTION     CATARACT EXTRACTION  Bilateral 10/2017   COLONOSCOPY  2004, 2009, 2014   [place]     HYSTEROSCOPY WITH D & C  12/07/2010   with resection of endometrial polyp   JOINT REPLACEMENT     KNEE ARTHROSCOPY Left 2008   lip biopsy     done at MD office Fri 11/22/13   MASS EXCISION Right 08/15/2013   Procedure: RIGHT INDEX EXCISION MASS ;  Surgeon: Tennis Must, MD;  Location: [place];  Service: Orthopedics;  Laterality: Right;   NASAL SEPTUM SURGERY     OOPHORECTOMY Right 2000   REVERSE SHOULDER ARTHROPLASTY Left 12/11/2018   REVERSE SHOULDER ARTHROPLASTY Left 12/11/2018   Procedure: LEFT REVERSE SHOULDER ARTHROPLASTY;  Surgeon: Meredith Pel, MD;  Location: [place];  Service: Orthopedics;  Laterality: Left;   SHOULDER ARTHROSCOPY WITH OPEN ROTATOR CUFF REPAIR AND DISTAL CLAVICLE ACROMINECTOMY Right 11/26/2013   Procedure: RIGHT SHOULDER ARTHROSCOPY WITH MINI OPEN ROTATOR CUFF REPAIR AND DISTAL CLAVICLE RESECTION, SUBACROMIAL DECOMPRESSION, POSSIBLE [facility] PATCH.;  Surgeon: [name], MD;  Location: [place];  Service: Orthopedics;  Laterality: Right;   TEE WITHOUT CARDIOVERSION N/A 02/16/2021   Procedure: TRANSESOPHAGEAL ECHOCARDIOGRAM (  TEE);  Surgeon: Acie Fredrickson Wonda Cheng, MD;  Location: [facility] ENDOSCOPY;  Service: Cardiovascular;  Laterality: N/A;   TOTAL KNEE ARTHROPLASTY Right 03/21/2017   TOTAL KNEE ARTHROPLASTY Right 03/21/2017   Procedure: RIGHT TOTAL KNEE ARTHROPLASTY;  Surgeon: [name], MD;  Location: [place];  Service: Orthopedics;  Laterality: Right;   TUBAL LIGATION      There were no vitals filed for this visit.   Subjective Assessment - 04/14/21 0833     Subjective  "I have my Physical Therapy evaluation today."    Pertinent History Recent CVA on 02/13/21 affecting R side, hx of CVA x2 in 1998 affecting L side, hx of R TKA, L TSA, R rotator cuff repair    Limitations weakness, fatigue, impaired [facility]    Patient Stated Goals  "Improve my strength, endurance, and coordination."    Currently in Pain? Yes    Pain Score 4     Pain Location Knee    Pain Orientation Left    Pain Descriptors / Indicators Aching    Pain Type Chronic pain    Pain Onset More than a month ago    Pain Frequency Constant    Aggravating Factors  standing, walking    Pain Relieving Factors rest, meds, heat    Effect of Pain on Daily Activities general discomfort with activity in neck, shoulders, and L knee from arthritis    Pain Onset More than a month ago            Occupational Therapy Treatment: Therapeutic Exercise: Facilitated hand strengthening with use of hand gripper set at 11.2# to place/remove jumbo pegs from pegboard x2 trials each hand.  Facilitated pinch strengthening with use of therapy resistant clothespins to target lateral and 3 point pinch of R/L hands, 1 trial each hand with each pinch type, working to increase hand strength for carrying and manipulation of ADL supplies.  Performed bilat elbow flexion with 2 lb dumbbell, bilat chest press, shoulder flex, ER to top of head, IR and extension behind back x2 sets 7-10 reps each with 1.5 lb dowel, rest between each exercise, working to increase BUE strength for self care tasks.   Neuro re-ed: Practiced small item pick up and reaching toward a target using small 1 " sticks and pushing them through small pegboard using L hand.  Pt required extra time to pick up sticks from table top without non skid surface, but improving aim when reaching toward target with LUE.   Response to Treatment: See Plan/clinical impression below.    OT Education - 04/14/21 0835     Education Details HEP progression    Person(s) Educated Patient    Methods Explanation;Verbal cues;Demonstration;Tactile cues    Comprehension Verbalized understanding;Returned demonstration;Verbal cues required;Need further instruction              OT Short Term Goals - 03/30/21 0810       OT SHORT TERM  GOAL #1   Title Pt will perform BUE HEP independently for increasing strenth and coordination for self care tasks.    Baseline Eval: initiated theraputty exercises at eval; pt is indep with theraputty/additions with shoulder exercises requires vc    Time 3    Period Weeks    Status On-going    Target Date 04/21/21               OT Long Term Goals - 03/30/21 1158       OT LONG TERM GOAL #1   Title Pt  will improve FOTO score to 80 or better to indicate increased functional performance.    Baseline Eval: FOTO 72 (questions answered related to RUE); 03/30/21 FOTO 54 (questions answered related to LUE)    Time 5    Period Weeks    Status On-going    Target Date 05/08/21      OT LONG TERM GOAL #2   Title Pt will improve bilat hand coordination to ease manipulation and efficiency with managing clothing fasteners.    Baseline Eval: extra time and effort to manage small buttons (R 9 hole peg test 30 sec, L 38 sec); 03/30/21: R 9 hole 27 sec, L 29, improving with clothing fasteners (extra time)    Time 5    Period Weeks    Status On-going    Target Date 05/08/21      OT LONG TERM GOAL #3   Title Pt will increase bilat hand strength by 5 or more lbs for improving grasp and manipulation of ADL supplies.    Baseline Eval: R grip 29 lbs, L grip 24 lbs; 03/2021: R grip 35 lbs, L grip 31 lbs    Time 5    Period Weeks    Status On-going    Target Date 05/08/21      OT LONG TERM GOAL #4   Title Pt will improve GMC bilaterally for improved accuracy and efficiency when reaching for ADL supplies.    Baseline Eval: mild BUE ataxia; Good accuracy with RUE reaching, mild ataxia LUE and difficulty reaching above shoulder level for ADL supplies in either arm    Time 5    Period Weeks    Status On-going    Target Date 05/08/21      OT LONG TERM GOAL #5   Title Pt will increase activity tolerance for IADLs and community mobility indicated by RPE of 2 or better with these activities.     Baseline Eval: RPE of 5 with IADLs and community mobility; 03/30/2021: RPE 4    Time 5    Period Weeks    Status On-going    Target Date 05/08/21      Long Term Additional Goals   Additional Long Term Goals Yes      OT LONG TERM GOAL #6   Title Pt will increase bilat shoulder flex/abd strength to ease over head reach for ADL supplies.    Baseline 03/30/21: R/L shoulder flex/abd 4-/5    Time 5    Period Weeks    Status New    Target Date 05/08/21               Plan - 04/14/21 0847     Clinical Impression Statement Pt continues develop LUE coordination and BUE strength for improving ADL performance.  Frequent rest breaks needed with shoulder exercises with heat application during rest periods for pain management.  Pt tolerates low weight (1 lb dumbell for a single arm, or 1.5 lb dowel for BUEs).  Pt will continue to benefit from skilled OT for increasing BUE strength and coordination in order to improve indep, functional reach, and efficiency with self care tasks.    OT Occupational Profile and History Detailed Assessment- Review of Records and additional review of physical, cognitive, psychosocial history related to current functional performance    Occupational performance deficits (Please refer to evaluation for details): ADL's;IADL's    Body Structure / Function / Physical Skills ADL;Dexterity;Strength;Coordination;FMC;Body mechanics;Endurance;UE functional use;GMC    Rehab Potential Excellent    Clinical  Decision Making Several treatment options, min-mod task modification necessary    Comorbidities Affecting Occupational Performance: May have comorbidities impacting occupational performance    Modification or Assistance to Complete Evaluation  No modification of tasks or assist necessary to complete eval    OT Frequency 2x / week    OT Duration 6 weeks    OT Treatment/Interventions Self-care/ADL training;Therapeutic exercise;DME and/or AE instruction;Neuromuscular  education;Manual Therapy;Moist Heat;Energy conservation;Therapeutic activities;Patient/family education;Passive range of motion    Plan OT to address strength and coordination deficits bilaterally which impact self care performance    OT Home Exercise Plan issued pink theraputty at eval and instructed in exercises/handout issued    Recommended Other Services N/A    Consulted and Agree with Plan of Care Patient             Patient will benefit from skilled therapeutic intervention in order to improve the following deficits and impairments:   Body Structure / Function / Physical Skills: ADL, Dexterity, Strength, Coordination, FMC, Body mechanics, Endurance, UE functional use, GMC       Visit Diagnosis: Muscle weakness (generalized)  Other lack of coordination    Problem List Patient Active Problem List   Diagnosis Date Noted   ICH (intracerebral hemorrhage) ([place]) 02/13/2021   Iron deficiency 10/07/2020   Aortic atherosclerosis ([place]) 03/17/2020   Lung nodule seen on imaging study 02/24/2020   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   Chronic left shoulder pain 08/23/2018   Chronic diarrhea 12/07/2014   Segmental colitis with rectal bleeding ([place]) 09/12/2014   Routine general medical examination at a health care facility 06/05/2014   Overweight 06/13/2011   Hyperlipidemia 02/17/2007   Essential hypertension 01/08/2007   Osteoarthritis 01/08/2007   Leta Speller, MS, OTR/L  Darleene Cleaver, OT 04/15/2021, 8:48 AM  [facility] [address] [place], [place], [address] Phone: [phone]   Fax:  [phone]  Name: NYESHA CLIFF MRN: [id number] Date of Birth: [birth year]

## 2021-04-16 ENCOUNTER — Ambulatory Visit: Payer: Medicare Other

## 2021-04-16 ENCOUNTER — Other Ambulatory Visit: Payer: Self-pay

## 2021-04-16 DIAGNOSIS — M25562 Pain in left knee: Secondary | ICD-10-CM | POA: Diagnosis not present

## 2021-04-16 DIAGNOSIS — R2689 Other abnormalities of gait and mobility: Secondary | ICD-10-CM

## 2021-04-16 DIAGNOSIS — M25612 Stiffness of left shoulder, not elsewhere classified: Secondary | ICD-10-CM | POA: Diagnosis not present

## 2021-04-16 DIAGNOSIS — R2681 Unsteadiness on feet: Secondary | ICD-10-CM | POA: Diagnosis not present

## 2021-04-16 DIAGNOSIS — R278 Other lack of coordination: Secondary | ICD-10-CM | POA: Diagnosis not present

## 2021-04-16 DIAGNOSIS — M6281 Muscle weakness (generalized): Secondary | ICD-10-CM

## 2021-04-16 NOTE — Therapy (Signed)
[name] [facility] SERVICES [address] [place], [place], [address] Phone: [phone]   Fax:  [phone]  Physical Therapy Treatment  Patient Details  Name: [name] MRN: [id number] Date of Birth: 28-Jul-1950 Referring Provider (PT): Antony Contras, MD   Encounter Date: 04/16/2021   PT End of Session - 04/16/21 1112     Visit Number 2    Number of Visits 13    Date for PT Re-Evaluation 05/26/21   Pt agreeable to 6-week period   Authorization Type Medicare primary, [employer] secondary    Authorization Time Period 04/14/21-05/26/21    Progress Note Due on Visit 10    PT Start Time 1017    PT Stop Time 1100    PT Time Calculation (min) 43 min    Equipment Utilized During Treatment Gait belt    Activity Tolerance Patient tolerated treatment well;Patient limited by fatigue    Behavior During Therapy WFL for tasks assessed/performed             Past Medical History:  Diagnosis Date   Arthritis    back, fingers with joint pain and swelling.  chronic back pain   Cataract    Chronic back pain    arthritis    Diverticulosis    Elevated cholesterol    takes Niacin daily and Simvastatin   GERD (gastroesophageal reflux disease) 06/2004   non-specific gastritis on EGD 06/2004   Headache(784.0)    occasionally   History of colon polyps 2004, 2009   2004:adenomatous. 2009 hyperplastic.    History of hiatal hernia    Hypertension    takes Tenoretic and Lisinopril daily   Mucoid cyst of joint 08/2013   right index finger   Osteopenia 01/2014   T score -1.1 FRAX 14%/0.5%. Stable from prior DEXA   PONV (postoperative nausea and vomiting)    Rotator cuff arthropathy    Left   Seasonal allergies    Stroke ([place]) 1998   x 2 - mild left-sided weakness   Urge incontinence    Uterine prolapse     Past Surgical History:  Procedure Laterality Date   BACK SURGERY     x 2   BELPHAROPTOSIS REPAIR Bilateral 12/14/2017   BUBBLE STUDY   02/16/2021   Procedure: BUBBLE STUDY;  Surgeon: Thayer Headings, MD;  Location: [facility] ENDOSCOPY;  Service: Cardiovascular;;   CATARACT EXTRACTION     CATARACT EXTRACTION Bilateral 10/2017   COLONOSCOPY  2004, 2009, 2014   [place]     HYSTEROSCOPY WITH D & C  12/07/2010   with resection of endometrial polyp   JOINT REPLACEMENT     KNEE ARTHROSCOPY Left 2008   lip biopsy     done at MD office Fri 11/22/13   MASS EXCISION Right 08/15/2013   Procedure: RIGHT INDEX EXCISION MASS ;  Surgeon: Tennis Must, MD;  Location: [place];  Service: Orthopedics;  Laterality: Right;   NASAL SEPTUM SURGERY     OOPHORECTOMY Right 2000   REVERSE SHOULDER ARTHROPLASTY Left 12/11/2018   REVERSE SHOULDER ARTHROPLASTY Left 12/11/2018   Procedure: LEFT REVERSE SHOULDER ARTHROPLASTY;  Surgeon: Meredith Pel, MD;  Location: [place];  Service: Orthopedics;  Laterality: Left;   SHOULDER ARTHROSCOPY WITH OPEN ROTATOR CUFF REPAIR AND DISTAL CLAVICLE ACROMINECTOMY Right 11/26/2013   Procedure: RIGHT SHOULDER ARTHROSCOPY WITH MINI OPEN ROTATOR CUFF REPAIR AND DISTAL CLAVICLE RESECTION,  SUBACROMIAL DECOMPRESSION, POSSIBLE [facility] PATCH.;  Surgeon: [name], MD;  Location: [place];  Service: Orthopedics;  Laterality: Right;   TEE WITHOUT CARDIOVERSION N/A 02/16/2021   Procedure: TRANSESOPHAGEAL ECHOCARDIOGRAM (TEE);  Surgeon: Acie Fredrickson Wonda Cheng, MD;  Location: [facility] ENDOSCOPY;  Service: Cardiovascular;  Laterality: N/A;   TOTAL KNEE ARTHROPLASTY Right 03/21/2017   TOTAL KNEE ARTHROPLASTY Right 03/21/2017   Procedure: RIGHT TOTAL KNEE ARTHROPLASTY;  Surgeon: [name], MD;  Location: [place];  Service: Orthopedics;  Laterality: Right;   TUBAL LIGATION      There were no vitals filed for this visit.   Subjective Assessment - 04/16/21 1016     Subjective Pt reports L knee pain is currently a 5-6/10. Pt reports no stumbles/LOB.    Pertinent  History [name] is a 53yoF who presents to OPPT on 04/14/21 for evaluation.   Pt evaluated by PT at [facility] on 11/7 while admitted for acute unsteadiness, Rt facial droop, imaging revealing of acute Lt CVA, remote infarcts seen bilat, pt scored 13/24 DG on 02/15/21. PT has been working with [facility] here since 02/25/21.  PMH: HTN, Rt RCR, Lt TSA, bilat TKA.    How long can you sit comfortably? unlimited    How long can you stand comfortably? Can stand through all tasks, but is easily fatigued thereafter    How long can you walk comfortably? A regular grocery trip    Currently in Pain? Yes    Pain Location Knee    Pain Orientation Left           INTERVENTIONS   MMT:  BLE strength grossly 4-/5   STS -2x10; pt rates as medium  Standing hip abduction 2x10; pt rates difficult  Seated LAQ 20x alternating; pt rates medium  Mini squats 10x; pt reports BLE fatigue, challenge with intervention   Nustep HIIT 1 min level 0 30 sec level 2 1 min level 0 30 sec level 2 2 min level 0  Comments: Pt reports BLE fatigue with intervention. SPM maintained in 109s. Distance = 0.16 miles   MCTSIB: Able to complete all 4 conditions, but significant sway in condition 4 and increased sway condition 2. Pt does report hx of falls walking in the dark.    SLS 1x30 sec each LE; pt able to maintain about 30 sec each LE  Tandem stance 2x30 sec each LE; pt requires intermittent UE support  Airex pad: NBOS EC 30 sec --progressed to performing with vertical and horizontal head turns 12x for each, significant increase in sway, PT at times provides up to min assist --progressed to static stance, NBOS EC with dual cog task of naming animals and foods x60 sec  Reviewed HEP with pt: Access Code: [email or web address] URL: [email or web address] Date: 04/16/2021 Prepared by: Ricard Dillon  Exercises Sit to Stand with Counter Support - 1 x daily - 5 x weekly - 2 sets - 10 reps Standing Hip Abduction with  Counter Support - 1 x daily - 5 x weekly - 2 sets - 10 reps   Pt educated throughout session about proper posture and technique with exercises. Improved exercise technique, movement at target joints, use of target muscles after min to mod verbal, visual, tactile cues.    PT Education - 04/16/21 1111     Education Details exercise technique, body mechanics, HEP    Person(s) Educated Patient    Methods Explanation;Demonstration;Tactile cues;Verbal cues;Handout    Comprehension Verbalized understanding;Returned demonstration;Verbal cues required;Need further instruction  PT Short Term Goals - 04/14/21 1637       PT SHORT TERM GOAL #1   Title Pt will be independent with HEP in order to improve strength and endurance for mobility in community.    Baseline maxed out with grocery shopping    Time 2    Period Weeks    Target Date 04/28/21               PT Long Term Goals - 04/14/21 1638       PT LONG TERM GOAL #1   Title Pt to improve FOTO survey score from 55 to >60 to represent improved perception of facility in basic movement.    Baseline FOTO 55    Time 6    Period Weeks    Status New    Target Date 05/26/21      PT LONG TERM GOAL #2   Title Pt to demonstrate improved Left SLSL >20 sec without UE support.    Baseline 04/14/21: >20sec Rt; <12sec Left.    Time 6    Period Weeks    Status New    Target Date 05/26/21      PT LONG TERM GOAL #3   Title Pt to increase 6MWT from 951f to >13011fto improve ergonomic and tolerance in community mobility for IADL.    Baseline 95068f  Time 6    Period Weeks    Status New    Target Date 05/26/21      PT LONG TERM GOAL #4   Title Pt to demonstrate ability to transition from floor to lying down to floor.    Baseline Eval: pt struggles with getting off floor due to acute on chronic DJD and weakness in 3 limbs.    Time 6    Period Weeks    Status New    Target Date 05/26/21                    Plan - 04/16/21 1112     Clinical Impression Statement Pt presented with excellent motivation to participate in session. PT instructed pt in several therex interventions to improve BLE strength, as MMT revealed gross BLE strength is 4-/5. HEP also initiated to address these defictis. Additionally, pt initiated endurance intervention on nustep, and performed higher level balance exercises. The pt was limited due to fatigue. The pt will continue to benefit from further skilled PT to improve LE strength, endurance and balance to increase QOL and decreaes fall risk.    Personal Factors and Comorbidities Age;Behavior Pattern;Fitness;Past/Current Experience    Examination-Activity Limitations Locomotion Level;Carry;Stairs;Stand    Examination-Participation Restrictions Driving    Stability/Clinical Decision Making Stable/Uncomplicated    Rehab Potential Good    PT Frequency 2x / week    PT Duration 6 weeks    PT Treatment/Interventions ADLs/Self Care Home Management;Electrical Stimulation;Therapeutic activities;Passive range of motion;Manual techniques;Patient/family education;Therapeutic exercise;Iontophoresis 4mg65m Dexamethasone;Moist Heat;Traction;Cryotherapy;Ultrasound;Functional mobility training;Neuromuscular re-education;Balance training;Dry needling;Joint Manipulations;Stair training;Gait training;DME Instruction    PT Next Visit Plan progress endurance with overground AMB, strenghtening, higher level balance    PT Home Exercise Plan ?Access Code: [email or web address] and Agree with Plan of Care Patient             Patient will benefit from skilled therapeutic intervention in order to improve the following deficits and impairments:  Decreased endurance, Decreased strength, Decreased coordination, Improper body mechanics, Cardiopulmonary status limiting activity, Decreased activity tolerance, Decreased balance, Decreased mobility, Postural  dysfunction  Visit Diagnosis: Muscle weakness  (generalized)  Unsteadiness on feet  Other abnormalities of gait and mobility     Problem List Patient Active Problem List   Diagnosis Date Noted   ICH (intracerebral hemorrhage) ([place]) 02/13/2021   Iron deficiency 10/07/2020   Aortic atherosclerosis ([place]) 03/17/2020   Lung nodule seen on imaging study 02/24/2020   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   Chronic left shoulder pain 08/23/2018   Chronic diarrhea 12/07/2014   Segmental colitis with rectal bleeding ([place]) 09/12/2014   Routine general medical examination at a health care facility 06/05/2014   Overweight 06/13/2011   Hyperlipidemia 02/17/2007   Essential hypertension 01/08/2007   Osteoarthritis 01/08/2007    Zollie Pee, PT 04/16/2021, 11:16 AM  [facility] [address] [place], [place], [address] Phone: [phone]   Fax:  [phone]  Name: STEPHANYE FINNICUM MRN: [id number] Date of Birth: 1950/07/08

## 2021-04-16 NOTE — Therapy (Signed)
[place] MAIN [facility] SERVICES [address] [place], [place], [address] Phone: [phone]   Fax:  [phone]  Occupational Therapy Treatment  Patient Details  Name: [name] MRN: [id number] Date of Birth: 1950/04/12 Referring Provider (OT): Dr. Antony Contras   Encounter Date: 04/16/2021   OT End of Session - 04/16/21 1119     Visit Number 14    Number of Visits 21    Date for OT Re-Evaluation 05/08/21    Authorization Time Period Reporting period beginning 04/01/21    OT Start Time 1104    OT Stop Time 1144    OT Time Calculation (min) 40 min    Activity Tolerance Patient tolerated treatment well    Behavior During Therapy [facility] for tasks assessed/performed             Past Medical History:  Diagnosis Date   Arthritis    back, fingers with joint pain and swelling.  chronic back pain   Cataract    Chronic back pain    arthritis    Diverticulosis    Elevated cholesterol    takes Niacin daily and Simvastatin   GERD (gastroesophageal reflux disease) 06/2004   non-specific gastritis on EGD 06/2004   Headache(784.0)    occasionally   History of colon polyps 2004, 2009   2004:adenomatous. 2009 hyperplastic.    History of hiatal hernia    Hypertension    takes Tenoretic and Lisinopril daily   Mucoid cyst of joint 08/2013   right index finger   Osteopenia 01/2014   T score -1.1 FRAX 14%/0.5%. Stable from prior DEXA   PONV (postoperative nausea and vomiting)    Rotator cuff arthropathy    Left   Seasonal allergies    Stroke ([place]) 1998   x 2 - mild left-sided weakness   Urge incontinence    Uterine prolapse     Past Surgical History:  Procedure Laterality Date   BACK SURGERY     x 2   BELPHAROPTOSIS REPAIR Bilateral 12/14/2017   BUBBLE STUDY  02/16/2021   Procedure: BUBBLE STUDY;  Surgeon: Thayer Headings, MD;  Location: [facility] ENDOSCOPY;  Service: Cardiovascular;;   CATARACT EXTRACTION     CATARACT EXTRACTION  Bilateral 10/2017   COLONOSCOPY  2004, 2009, 2014   [facility]     HYSTEROSCOPY WITH D & C  12/07/2010   with resection of endometrial polyp   JOINT REPLACEMENT     KNEE ARTHROSCOPY Left 2008   lip biopsy     done at MD office Fri 11/22/13   MASS EXCISION Right 08/15/2013   Procedure: RIGHT INDEX EXCISION MASS ;  Surgeon: Tennis Must, MD;  Location: [place];  Service: Orthopedics;  Laterality: Right;   NASAL SEPTUM SURGERY     OOPHORECTOMY Right 2000   REVERSE SHOULDER ARTHROPLASTY Left 12/11/2018   REVERSE SHOULDER ARTHROPLASTY Left 12/11/2018   Procedure: LEFT REVERSE SHOULDER ARTHROPLASTY;  Surgeon: Meredith Pel, MD;  Location: [place];  Service: Orthopedics;  Laterality: Left;   SHOULDER ARTHROSCOPY WITH OPEN ROTATOR CUFF REPAIR AND DISTAL CLAVICLE ACROMINECTOMY Right 11/26/2013   Procedure: RIGHT SHOULDER ARTHROSCOPY WITH MINI OPEN ROTATOR CUFF REPAIR AND DISTAL CLAVICLE RESECTION, SUBACROMIAL DECOMPRESSION, POSSIBLE [facility] PATCH.;  Surgeon: [name], MD;  Location: [place];  Service: Orthopedics;  Laterality: Right;   TEE WITHOUT CARDIOVERSION N/A 02/16/2021   Procedure: TRANSESOPHAGEAL ECHOCARDIOGRAM (  TEE);  Surgeon: Acie Fredrickson Wonda Cheng, MD;  Location: [facility] ENDOSCOPY;  Service: Cardiovascular;  Laterality: N/A;   TOTAL KNEE ARTHROPLASTY Right 03/21/2017   TOTAL KNEE ARTHROPLASTY Right 03/21/2017   Procedure: RIGHT TOTAL KNEE ARTHROPLASTY;  Surgeon: [name], MD;  Location: [place];  Service: Orthopedics;  Laterality: Right;   TUBAL LIGATION      There were no vitals filed for this visit.   Subjective Assessment - 04/16/21 1112     Subjective  "She really worked me out today." (pt referring to PT session)    Pertinent History Recent CVA on 02/13/21 affecting R side, hx of CVA x2 in 1998 affecting L side, hx of R TKA, L TSA, R rotator cuff repair    Limitations weakness, fatigue, impaired [facility]     Patient Stated Goals "Improve my strength, endurance, and coordination."    Currently in Pain? Yes    Pain Score 6     Pain Location Knee    Pain Orientation Left    Pain Descriptors / Indicators Aching    Pain Type Chronic pain    Pain Onset More than a month ago    Pain Frequency Constant    Aggravating Factors  standing, walking    Pain Relieving Factors rest, meds, heat    Effect of Pain on Daily Activities pain with mobility    Multiple Pain Sites Yes    Pain Score 3    Pain Location Shoulder    Pain Orientation Right;Left    Pain Descriptors / Indicators Aching    Pain Type Chronic pain    Pain Onset More than a month ago    Pain Frequency Intermittent    Aggravating Factors  arthritic pain, raising arms above shoulder level    Pain Relieving Factors rest, heat, pain medications    Effect of Pain on Daily Activities pain with reaching overhead            Occupational Therapy Treatment: Therapeutic Exercise: Facilitated hand strengthening with use of hand gripper at light resistance (1 red band) to complete 5 sets 10 reps of squeezes, alternating hands, working to increase grip strength for holding and carrying ADL supplies.  Facilitated pinch strengthening with use of therapy resistant clothespins to target lateral and 3 point pinch of R/L hand 1 trial each hand for each pinch type.  Secured 1 lb wrist weight to wrist and facilitated forward and lateral reaching shoulder level to clip pins on target, alternated hands for reps on each side.  Frequent rest breaks needed.  Performed 1.5 lb dowel exercises for further shoulder strengthening to complete shoulder flex/abd, ER, ext, IR, abd, and chest press x2 sets 10 reps each, intermittent cues for erect posture and forward head during completion of shoulder ext and IR behind back.  Performed scapular retraction and shoulder circles x10 each.  Performed bicep strengthening with 2 lb weight for 2 sets 10 reps each arm.    Response to  Treatment: See Plan/clinical impression below.     OT Education - 04/16/21 1118     Education Details HEP progression    Person(s) Educated Patient    Methods Explanation;Verbal cues;Demonstration;Tactile cues    Comprehension Verbalized understanding;Returned demonstration;Verbal cues required;Need further instruction              OT Short Term Goals - 03/30/21 0810       OT SHORT TERM GOAL #1   Title Pt will perform BUE HEP independently for increasing strenth and  coordination for self care tasks.    Baseline Eval: initiated theraputty exercises at eval; pt is indep with theraputty/additions with shoulder exercises requires vc    Time 3    Period Weeks    Status On-going    Target Date 04/21/21               OT Long Term Goals - 03/30/21 1158       OT LONG TERM GOAL #1   Title Pt will improve FOTO score to 80 or better to indicate increased functional performance.    Baseline Eval: FOTO 72 (questions answered related to RUE); 03/30/21 FOTO 54 (questions answered related to LUE)    Time 5    Period Weeks    Status On-going    Target Date 05/08/21      OT LONG TERM GOAL #2   Title Pt will improve bilat hand coordination to ease manipulation and efficiency with managing clothing fasteners.    Baseline Eval: extra time and effort to manage small buttons (R 9 hole peg test 30 sec, L 38 sec); 03/30/21: R 9 hole 27 sec, L 29, improving with clothing fasteners (extra time)    Time 5    Period Weeks    Status On-going    Target Date 05/08/21      OT LONG TERM GOAL #3   Title Pt will increase bilat hand strength by 5 or more lbs for improving grasp and manipulation of ADL supplies.    Baseline Eval: R grip 29 lbs, L grip 24 lbs; 03/2021: R grip 35 lbs, L grip 31 lbs    Time 5    Period Weeks    Status On-going    Target Date 05/08/21      OT LONG TERM GOAL #4   Title Pt will improve GMC bilaterally for improved accuracy and efficiency when reaching for ADL  supplies.    Baseline Eval: mild BUE ataxia; Good accuracy with RUE reaching, mild ataxia LUE and difficulty reaching above shoulder level for ADL supplies in either arm    Time 5    Period Weeks    Status On-going    Target Date 05/08/21      OT LONG TERM GOAL #5   Title Pt will increase activity tolerance for IADLs and community mobility indicated by RPE of 2 or better with these activities.    Baseline Eval: RPE of 5 with IADLs and community mobility; 03/30/2021: RPE 4    Time 5    Period Weeks    Status On-going    Target Date 05/08/21      Long Term Additional Goals   Additional Long Term Goals Yes      OT LONG TERM GOAL #6   Title Pt will increase bilat shoulder flex/abd strength to ease over head reach for ADL supplies.    Baseline 03/30/21: R/L shoulder flex/abd 4-/5    Time 5    Period Weeks    Status New    Target Date 05/08/21              Plan - 04/16/21 1159     Clinical Impression Statement Pt continues to demonstrate improved reach above shoulder level, but fatigues at 5-7 reps, though minimal pain reported this day at 3/10 in bilat shoulders.  Pt continues to manage pain at home with heat, rest, and meds.  Pt continues to develop LUE coordination as noted by less shaking when reaching toward a target.  Pt  will continue to benefit from skilled OT for increasing BUE strength and coordination in order to improve indep, functional reach, and efficiency with self care tasks.    OT Occupational Profile and History Detailed Assessment- Review of Records and additional review of physical, cognitive, psychosocial history related to current functional performance    Occupational performance deficits (Please refer to evaluation for details): ADL's;IADL's    Body Structure / Function / Physical Skills ADL;Dexterity;Strength;Coordination;FMC;Body mechanics;Endurance;UE functional use;GMC    Rehab Potential Excellent    Clinical Decision Making Several treatment options,  min-mod task modification necessary    Comorbidities Affecting Occupational Performance: May have comorbidities impacting occupational performance    Modification or Assistance to Complete Evaluation  No modification of tasks or assist necessary to complete eval    OT Frequency 2x / week    OT Duration 6 weeks    OT Treatment/Interventions Self-care/ADL training;Therapeutic exercise;DME and/or AE instruction;Neuromuscular education;Manual Therapy;Moist Heat;Energy conservation;Therapeutic activities;Patient/family education;Passive range of motion    Plan OT to address strength and coordination deficits bilaterally which impact self care performance    OT Home Exercise Plan issued pink theraputty at eval and instructed in exercises/handout issued    Recommended Other Services N/A    Consulted and Agree with Plan of Care Patient             Patient will benefit from skilled therapeutic intervention in order to improve the following deficits and impairments:   Body Structure / Function / Physical Skills: ADL, Dexterity, Strength, Coordination, FMC, Body mechanics, Endurance, UE functional use, GMC       Visit Diagnosis: Muscle weakness (generalized)  Other lack of coordination    Problem List Patient Active Problem List   Diagnosis Date Noted   ICH (intracerebral hemorrhage) ([place]) 02/13/2021   Iron deficiency 10/07/2020   Aortic atherosclerosis ([place]) 03/17/2020   Lung nodule seen on imaging study 02/24/2020   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   Chronic left shoulder pain 08/23/2018   Chronic diarrhea 12/07/2014   Segmental colitis with rectal bleeding ([place]) 09/12/2014   Routine general medical examination at a health care facility 06/05/2014   Overweight 06/13/2011   Hyperlipidemia 02/17/2007   Essential hypertension 01/08/2007   Osteoarthritis 01/08/2007   Leta Speller, MS, OTR/L  Darleene Cleaver, OT 04/16/2021, 11:59 AM  [place] [address] [place], [place], [address] Phone: [phone]   Fax:  [phone]  Name: [name] MRN: [id number] Date of Birth: [birth year]

## 2021-04-19 ENCOUNTER — Encounter (HOSPITAL_BASED_OUTPATIENT_CLINIC_OR_DEPARTMENT_OTHER): Payer: Self-pay | Admitting: Cardiology

## 2021-04-19 ENCOUNTER — Ambulatory Visit (INDEPENDENT_AMBULATORY_CARE_PROVIDER_SITE_OTHER): Payer: Medicare Other | Admitting: Cardiology

## 2021-04-19 ENCOUNTER — Other Ambulatory Visit: Payer: Self-pay

## 2021-04-19 VITALS — BP 98/60 | HR 71 | Ht 62.0 in | Wt 167.2 lb

## 2021-04-19 DIAGNOSIS — E7800 Pure hypercholesterolemia, unspecified: Secondary | ICD-10-CM

## 2021-04-19 DIAGNOSIS — Z8249 Family history of ischemic heart disease and other diseases of the circulatory system: Secondary | ICD-10-CM | POA: Diagnosis not present

## 2021-04-19 DIAGNOSIS — Z712 Person consulting for explanation of examination or test findings: Secondary | ICD-10-CM | POA: Diagnosis not present

## 2021-04-19 DIAGNOSIS — I639 Cerebral infarction, unspecified: Secondary | ICD-10-CM

## 2021-04-19 DIAGNOSIS — Z8673 Personal history of transient ischemic attack (TIA), and cerebral infarction without residual deficits: Secondary | ICD-10-CM | POA: Diagnosis not present

## 2021-04-19 DIAGNOSIS — I10 Essential (primary) hypertension: Secondary | ICD-10-CM | POA: Diagnosis not present

## 2021-04-19 DIAGNOSIS — Z7189 Other specified counseling: Secondary | ICD-10-CM

## 2021-04-19 NOTE — Progress Notes (Incomplete)
Cardiology Office Note:    Date:  04/19/2021   ID:  [name], DOB 1951-03-25, MRN [id number]  PCP:  Hoyt Koch, MD  Cardiologist:  Buford Dresser, MD  Referring MD: Hoyt Koch, *   No chief complaint on file.   History of Present Illness:    [name] is a 71 y.o. female with a hx of hypertension, hyperlipidemia, stroke, GERD, and arthritis, who is seen as a new consult at the request of Hoyt Koch, * for the evaluation and management of CVA, and discussion of monitor 03/2021 results.  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  Per patient's record: Cardiology asked to arrange a 30 day monitor to evaluate etiology of CVA. Order place for Dr. Harrell Gave to read. Follow-up appointment has been arranged.              Abigail Butts, PA-C               02/17/21; 2:37 PM  Monitor 03/2021: ~28 days of data recorded on [employer] monitor. Patient had a min HR of 50 bpm, max HR of 126 bpm, and avg HR of 77 bpm. Predominant underlying rhythm was Sinus Rhythm. No VT, SVT, atrial fibrillation, high degree block, or pauses noted. Isolated atrial and ventricular ectopy was rare (<5%). There were 4 triggered events, sinus rhythm. No significant arrhythmias detected.  Cardiovascular risk factors: Prior clinical ASCVD: Stroke x2 (1998), Stroke (02/13/2021) Comorbid conditions, including diabetes, chronic kidney disease: hypertension, hyperlipidemia Metabolic syndrome/Obesity: Chronic inflammatory conditions: Tobacco use history: Family history: Grandfather had a stroke. Father had a stroke that manifested as hemorrhagic vessel in his eye, also had a heart attack and abdominal aneurysm.  Prior cardiac testing and/or incidental findings on other testing (ie coronary calcium): Hospital work-up (11/5-11/11/2020) Exercise level: Current diet:  Overall she feels she is recovering well, she is currently in  PT.   At home her blood pressure is averaging 100s/80s. She denies any dizziness or lightheadedness associated with low blood pressures. She is concerned that her blood pressure monitor often tells her she has an irregular heartbeat. She denies being able to feel any arrhythmias or palpitations.  She endorses persistent mild LE edema. She affirms the amount seen in clinic today is typical for her.  She denies any chest pain, or shortness of breath. No headaches, syncope, orthopnea, PND, or exertional symptoms.  Past Medical History:  Diagnosis Date   Arthritis    back, fingers with joint pain and swelling.  chronic back pain   Cataract    Chronic back pain    arthritis    Diverticulosis    Elevated cholesterol    takes Niacin daily and Simvastatin   GERD (gastroesophageal reflux disease) 06/2004   non-specific gastritis on EGD 06/2004   Headache(784.0)    occasionally   History of colon polyps 2004, 2009   2004:adenomatous. 2009 hyperplastic.    History of hiatal hernia    Hypertension    takes Tenoretic and Lisinopril daily   Mucoid cyst of joint 08/2013   right index finger   Osteopenia 01/2014   T score -1.1 FRAX 14%/0.5%. Stable from prior DEXA   PONV (postoperative nausea and vomiting)    Rotator cuff arthropathy    Left   Seasonal allergies    Stroke ([place]) 1998   x 2 - mild left-sided weakness   Urge incontinence    Uterine prolapse     Past Surgical History:  Procedure Laterality Date   BACK SURGERY     x 2   BELPHAROPTOSIS REPAIR Bilateral 12/14/2017   BUBBLE STUDY  02/16/2021   Procedure: BUBBLE STUDY;  Surgeon: Thayer Headings, MD;  Location: [place];  Service: Cardiovascular;;   CATARACT EXTRACTION     CATARACT EXTRACTION Bilateral 10/2017   COLONOSCOPY  2004, 2009, 2014   [place]     HYSTEROSCOPY WITH D & C  12/07/2010   with resection of endometrial polyp   JOINT REPLACEMENT     KNEE ARTHROSCOPY  Left 2008   lip biopsy     done at MD office Fri 11/22/13   MASS EXCISION Right 08/15/2013   Procedure: RIGHT INDEX EXCISION MASS ;  Surgeon: Tennis Must, MD;  Location: [place];  Service: Orthopedics;  Laterality: Right;   NASAL SEPTUM SURGERY     OOPHORECTOMY Right 2000   REVERSE SHOULDER ARTHROPLASTY Left 12/11/2018   REVERSE SHOULDER ARTHROPLASTY Left 12/11/2018   Procedure: LEFT REVERSE SHOULDER ARTHROPLASTY;  Surgeon: Meredith Pel, MD;  Location: [place];  Service: Orthopedics;  Laterality: Left;   SHOULDER ARTHROSCOPY WITH OPEN ROTATOR CUFF REPAIR AND DISTAL CLAVICLE ACROMINECTOMY Right 11/26/2013   Procedure: RIGHT SHOULDER ARTHROSCOPY WITH MINI OPEN ROTATOR CUFF REPAIR AND DISTAL CLAVICLE RESECTION, SUBACROMIAL DECOMPRESSION, POSSIBLE [facility] PATCH.;  Surgeon: [name], MD;  Location: [place];  Service: Orthopedics;  Laterality: Right;   TEE WITHOUT CARDIOVERSION N/A 02/16/2021   Procedure: TRANSESOPHAGEAL ECHOCARDIOGRAM (TEE);  Surgeon: Acie Fredrickson Wonda Cheng, MD;  Location[facility] END[facility]COPY;  Service: Cardiovascular;  Laterality: N/A;   TOTAL KNEE ARTHROPLASTY Right 03/21/2017   TOTAL KNEE ARTHROPLASTY Right 03/21/2017   Procedure: RIGHT TOTAL KNEE ARTHROPLASTY;  Surgeon: [name], MD;  Location: [place];  Service: Orthopedics;  Laterality: Right;   TUBAL LIGATION      Current Medications: Current Outpatient Medications on File Prior to Visit  Medication Sig   Ascorbic Acid (VITAMIN C) 1000 MG tablet Take 1,000 mg by mouth 2 (two) times daily.   aspirin EC 81 MG EC tablet Take 1 tablet (81 mg total) by mouth daily. Swallow whole.   atenolol-chlorthalidone (TENORETIC) 50-25 MG tablet Take 1 tablet by mouth daily.   b complex vitamins tablet Take 1 tablet by mouth at bedtime.   Calcium Carbonate-Vitamin D (CALCIUM + D PO) Take 1 tablet by mouth at bedtime.   CHELATED MAGNESIUM PO Take 3 tablets by mouth every morning.   Cholecalciferol (D 5000) 5000  units capsule Take 5,000 Units by mouth 3 (three) times daily.   Coenzyme Q10 (COQ10) 100 MG CAPS Take 100 mg by mouth every evening.    diphenoxylate-atropine (LOMOTIL) 2.5-0.025 MG tablet TAKE 1 TABLET BY MOUTH FOUR TIMES A DAY AS NEEDED FOR DIARRHEA OR LOOSE STOOLS (Patient taking differently: Take 1 tablet by mouth 4 (four) times daily as needed for diarrhea or loose stools.)   Ferrous Sulfate (IRON) 325 (65 Fe) MG TABS Take 1 tablet (325 mg total) by mouth 2 (two) times daily. (Patient taking differently: Take 325 mg by mouth every morning.)   Fiber POWD Take 5 mLs by mouth 2 (two) times daily. Heather's Tummy - mix in water and drink   folic acid (FOLVITE) 160 MCG tablet Take 800 mcg by mouth every evening.    hyoscyamine (LEVSIN SL) 0.125 MG SL tablet Take 1-2 tablets by mouth every 4 hours as needed (Patient  taking differently: Take 0.125 mg by mouth 2 (two) times daily as needed (stomach cramps).)   ketotifen (ZADITOR) 0.025 % ophthalmic solution Place 2 drops into both eyes 2 (two) times daily as needed (allergies).   lisinopril (ZESTRIL) 2.5 MG tablet TAKE 1 TABLET DAILY   Melatonin 3 MG TABS Take 3 mg by mouth at bedtime.    methocarbamol (ROBAXIN) 500 MG tablet TAKE 1 TABLET BY MOUTH EVERY 8 HOURS AS NEEDED FOR MUSCLE SPASMS (Patient taking differently: Take 500 mg by mouth every 8 (eight) hours as needed for muscle spasms.)   Multiple Vitamin (MULTIVITAMIN WITH MINERALS) TABS tablet Take 1 tablet by mouth at bedtime.   OVER THE COUNTER MEDICATION Take 5 drops by mouth at bedtime. Lemon Balm   OVER THE COUNTER MEDICATION Take 1 Dose by mouth at bedtime. Cats Claw liquid daily   OVER THE COUNTER MEDICATION Apply 1 application topically at bedtime. Magnesium Lotion   polyvinyl alcohol (LIQUIFILM TEARS) 1.4 % ophthalmic solution Place 1 drop into both eyes daily as needed for dry eyes.   potassium chloride SA (KLOR-CON M20) 20 MEQ tablet Take 1 tablet (20 mEq total) by mouth 2 (two) times  daily.   Probiotic Product (PROBIOTIC DAILY PO) Take 1 capsule by mouth 2 (two) times daily.   simvastatin (ZOCOR) 20 MG tablet Take 1 tablet (20 mg total) by mouth daily.   Sodium Fluoride (SODIUM FLUORIDE 5000 PPM) 1.1 % PSTE Place 1 application onto teeth at bedtime.   Zinc Sulfate (ZINC 15 PO) Take 10 drops by mouth at bedtime. Liquid Blend 10 drops equal 15 mg   No current facility-administered medications on file prior to visit.     Allergies:   Eggs or egg-derived products, Etodolac, Fish-derived products, Omeprazole, Pineapple, and Gluten meal   Social History   Tobacco Use   Smoking status: Never    Passive exposure: Yes   Smokeless tobacco: Never  Vaping Use   Vaping Use: Never used  Substance Use Topics   Alcohol use: Not Currently    Alcohol/week: 0.0 standard drinks    Comment: about 2-3 times a year   Drug use: No    Family History: family history includes Diabetes in her father, maternal aunt, and paternal grandmother; Heart disease in her father and mother; Hypertension in her father, mother, and paternal grandfather; Stroke in her father and paternal grandfather. There is no history of Colon cancer, Esophageal cancer, Rectal cancer, or Stomach cancer.  ROS:   Please see the history of present illness.  Additional pertinent ROS: Constitutional: Negative for chills, fever, night sweats, unintentional weight loss  HENT: Negative for ear pain and hearing loss.   Eyes: Negative for loss of vision and eye pain.  Respiratory: Negative for cough, sputum, wheezing.   Cardiovascular: See HPI. Gastrointestinal: Negative for abdominal pain, melena, and hematochezia.  Genitourinary: Negative for dysuria and hematuria.  Musculoskeletal: Negative for falls and myalgias.  Skin: Negative for itching and rash.  Neurological: Negative for focal weakness, focal sensory changes and loss of consciousness.  Endo/Heme/Allergies: Does not bruise/bleed easily.     EKGs/Labs/Other  Studies Reviewed:    The following studies were reviewed today:  Monitor 03/2021: ~28 days of data recorded on [employer] monitor. Patient had a min HR of 50 bpm, max HR of 126 bpm, and avg HR of 77 bpm. Predominant underlying rhythm was Sinus Rhythm. No VT, SVT, atrial fibrillation, high degree block, or pauses noted. Isolated atrial and ventricular ectopy was rare (<5%).  There were 4 triggered events, sinus rhythm. No significant arrhythmias detected.  Echo TEE 02/16/2021:  1. Left ventricular ejection fraction, by estimation, is 60 to 65%. The  left ventricle has normal function.   2. Right ventricular systolic function is normal. The right ventricular  size is normal.   3. No left atrial/left atrial appendage thrombus was detected.   4. The mitral valve is normal in structure. Trivial mitral valve  regurgitation.   5. The aortic valve is tricuspid. Aortic valve regurgitation is trivial.  No aortic stenosis is present.   6. There is mild (Grade II) plaque.   7. Agitated saline contrast bubble study was negative, with no evidence  of any interatrial shunt.   Echo 02/14/2021:  1. Left ventricular ejection fraction, by estimation, is 65 to 70%. The  left ventricle has normal function. The left ventricle has no regional  wall motion abnormalities. Left ventricular diastolic parameters were  normal.   2. Right ventricular systolic function is normal. The right ventricular  size is normal. There is normal pulmonary artery systolic pressure.   3. Left atrial size was mildly dilated.   4. The mitral valve is normal in structure. Mild mitral valve  regurgitation. No evidence of mitral stenosis.   5. The aortic valve is tricuspid. Aortic valve regurgitation is not  visualized. No aortic stenosis is present.   6. The inferior vena cava is normal in size with greater than 50%  respiratory variability, suggesting right atrial pressure of 3 mmHg.   CT Chest 03/16/2020: COMPARISON:  October 24, 2018.   FINDINGS: Cardiovascular: Atherosclerosis of thoracic aorta is noted without aneurysm formation. Normal cardiac size. No pericardial effusion.   Mediastinum/Nodes: No enlarged mediastinal or axillary lymph nodes. Thyroid gland, trachea, and esophagus demonstrate no significant findings.   Lungs/Pleura: No pneumothorax or pleural effusion is noted. Right lung is clear. Stable 7 x 4 mm nodule is noted in left upper lobe best seen on image number 37 of series 8. Possible 3 mm nodule is noted in right upper lobe best seen on image number 95 of series 4.   Upper Abdomen: No acute abnormality.   Musculoskeletal: No chest wall mass or suspicious bone lesions identified.   IMPRESSION: 1. Stable 7 x 4 mm nodule is noted in left upper lobe. This can be considered benign at this point without further follow-up required 2. However, possible 3 mm nodule is noted in right upper lobe. No follow-up needed if patient is low-risk. Non-contrast chest CT can be considered in 12 months if patient is high-risk. This recommendation follows the consensus statement: Guidelines for Management of Incidental Pulmonary Nodules Detected on CT Images: From the Fleischner Society 2017; Radiology 2017; 284:228-243. 3. Aortic atherosclerosis.   Aortic Atherosclerosis (ICD10-I70.0).  EKG:  EKG is personally reviewed.   04/19/2021: ***  Recent Labs: 02/13/2021: ALT 19; BUN 17; Creatinine, Ser 0.73; Hemoglobin 12.7; Platelets 212; Potassium 3.7; Sodium 139   Recent Lipid Panel    Component Value Date/Time   CHOL 136 04/07/2021 1442   TRIG 97 04/07/2021 1442   HDL 44 04/07/2021 1442   CHOLHDL 3.1 04/07/2021 1442   CHOLHDL 4.8 02/15/2021 0218   VLDL 19 02/15/2021 0218   LDLCALC 74 04/07/2021 1442    Physical Exam:    VS:  BP 98/60    Pulse 71    Ht 5' 2"  (1.575 m)    Wt 167 lb 3.2 oz (75.8 kg)    LMP 02/09/2005  SpO2 95%    BMI 30.58 kg/m     Wt Readings from Last 3 Encounters:  04/19/21 167  lb 3.2 oz (75.8 kg)  04/07/21 164 lb (74.4 kg)  02/23/21 162 lb 6.4 oz (73.7 kg)    GEN: Well nourished, well developed in no acute distress HEENT: Normal, moist mucous membranes NECK: No JVD CARDIAC: regular rhythm, normal S1 and S2, no rubs or gallops. No murmur. VASCULAR: Radial and DP pulses 2+ bilaterally. No carotid bruits RESPIRATORY:  Clear to auscultation without rales, wheezing or rhonchi  ABDOMEN: Soft, non-tender, non-distended MUSCULOSKELETAL:  Ambulates independently SKIN: Warm and dry, no edema NEUROLOGIC:  Alert and oriented x 3. No focal neuro deficits noted. PSYCHIATRIC:  Normal affect    ASSESSMENT:    No diagnosis found. PLAN:     Cardiac risk counseling and prevention recommendations: -recommend heart healthy/Mediterranean diet, with whole grains, Haley, vegetable, fish, lean meats, nuts, and olive oil. Limit salt. -recommend moderate walking, 3-5 times/week for 30-50 minutes each session. Aim for at least 150 minutes.week. Goal should be pace of 3 miles/hours, or walking 1.5 miles in 30 minutes -recommend avoidance of tobacco products. Avoid excess alcohol. -ASCVD risk score: The ASCVD Risk score (Arnett DK, et al., 2019) failed to calculate for the following reasons:   The patient has a prior MI or stroke diagnosis    Plan for follow up: PRN.  Buford Dresser, MD, PhD, [facility]    Medication Adjustments/Labs and Tests Ordered: Current medicines are reviewed at length with the patient today.  Concerns regarding medicines are outlined above.   No orders of the defined types were placed in this encounter.  No orders of the defined types were placed in this encounter.  There are no Patient Instructions on file for this visit.   I,Mathew Stumpf,acting as a Education administrator for [employer], MD.,have documented all relevant documentation on the behalf of Buford Dresser, MD,as directed by  Buford Dresser, MD  while in the presence of Buford Dresser, MD.  ***  Signed, Buford Dresser, MD PhD 04/19/2021 10:11 AM    [place]

## 2021-04-19 NOTE — Patient Instructions (Signed)
Medication Instructions:  Your Physician recommend you continue on your current medication as directed.    *If you need a refill on your cardiac medications before your next appointment, please call your pharmacy*   Lab Work: None ordered today   Testing/Procedures: None ordered today   Follow-Up: At [facility], you and your health needs are our priority.  As part of our continuing mission to provide you with exceptional heart care, we have created designated Provider Care Teams.  These Care Teams include your primary Cardiologist (physician) and Advanced Practice Providers (APPs -  Physician Assistants and Nurse Practitioners) who all work together to provide you with the care you need, when you need it.  We recommend signing up for the patient portal called "MyChart".  Sign up information is provided on this After Visit Summary.  MyChart is used to connect with patients for Virtual Visits (Telemedicine).  Patients are able to view lab/test results, encounter notes, upcoming appointments, etc.  Non-urgent messages can be sent to your provider as well.   To learn more about what you can do with MyChart, go to [email or web address].    Your next appointment:   As needed   The format for your next appointment:   In Person  Provider:   Buford Dresser, MD

## 2021-04-20 ENCOUNTER — Ambulatory Visit: Payer: Medicare Other

## 2021-04-20 DIAGNOSIS — R2681 Unsteadiness on feet: Secondary | ICD-10-CM

## 2021-04-20 DIAGNOSIS — R2689 Other abnormalities of gait and mobility: Secondary | ICD-10-CM | POA: Diagnosis not present

## 2021-04-20 DIAGNOSIS — M25612 Stiffness of left shoulder, not elsewhere classified: Secondary | ICD-10-CM | POA: Diagnosis not present

## 2021-04-20 DIAGNOSIS — R278 Other lack of coordination: Secondary | ICD-10-CM | POA: Diagnosis not present

## 2021-04-20 DIAGNOSIS — M25562 Pain in left knee: Secondary | ICD-10-CM | POA: Diagnosis not present

## 2021-04-20 DIAGNOSIS — M6281 Muscle weakness (generalized): Secondary | ICD-10-CM

## 2021-04-20 DIAGNOSIS — M79605 Pain in left leg: Secondary | ICD-10-CM

## 2021-04-20 NOTE — Therapy (Signed)
[place] MAIN [facility] SERVICES [address] [place], [place], [address] Phone: [phone]   Fax:  [phone]  Physical Therapy Treatment  Patient Details  Name: [name] MRN: [id number] Date of Birth: [birth year] Referring Provider (PT): Antony Contras, MD   Encounter Date: 04/20/2021   PT End of Session - 04/20/21 1924     Visit Number 3    Number of Visits 13    Date for PT Re-Evaluation 05/26/21   Pt agreeable to 6-week period   Authorization Type Medicare primary, [employer] secondary    Authorization Time Period 04/14/21-05/26/21    Progress Note Due on Visit 10    PT Start Time 1510    PT Stop Time 1556    PT Time Calculation (min) 46 min    Equipment Utilized During Treatment Gait belt    Activity Tolerance Patient tolerated treatment well;Patient limited by fatigue    Behavior During Therapy WFL for tasks assessed/performed             Past Medical History:  Diagnosis Date   Arthritis    back, fingers with joint pain and swelling.  chronic back pain   Cataract    Chronic back pain    arthritis    Diverticulosis    Elevated cholesterol    takes Niacin daily and Simvastatin   GERD (gastroesophageal reflux disease) 06/2004   non-specific gastritis on EGD 06/2004   Headache(784.0)    occasionally   History of colon polyps 2004, 2009   2004:adenomatous. 2009 hyperplastic.    History of hiatal hernia    Hypertension    takes Tenoretic and Lisinopril daily   Mucoid cyst of joint 08/2013   right index finger   Osteopenia 01/2014   T score -1.1 FRAX 14%/0.5%. Stable from prior DEXA   PONV (postoperative nausea and vomiting)    Rotator cuff arthropathy    Left   Seasonal allergies    Stroke ([place]) 1998   x 2 - mild left-sided weakness   Urge incontinence    Uterine prolapse     Past Surgical History:  Procedure Laterality Date   BACK SURGERY     x 2   BELPHAROPTOSIS REPAIR Bilateral 12/14/2017   BUBBLE STUDY   02/16/2021   Procedure: BUBBLE STUDY;  Surgeon: Thayer Headings, MD;  Location: [facility] ENDOSCOPY;  Service: Cardiovascular;;   CATARACT EXTRACTION     CATARACT EXTRACTION Bilateral 10/2017   COLONOSCOPY  2004, 2009, 2014   [place]     HYSTEROSCOPY WITH D & C  12/07/2010   with resection of endometrial polyp   JOINT REPLACEMENT     KNEE ARTHROSCOPY Left 2008   lip biopsy     done at MD office Fri 11/22/13   MASS EXCISION Right 08/15/2013   Procedure: RIGHT INDEX EXCISION MASS ;  Surgeon: Tennis Must, MD;  Location: [place];  Service: Orthopedics;  Laterality: Right;   NASAL SEPTUM SURGERY     OOPHORECTOMY Right 2000   REVERSE SHOULDER ARTHROPLASTY Left 12/11/2018   REVERSE SHOULDER ARTHROPLASTY Left 12/11/2018   Procedure: LEFT REVERSE SHOULDER ARTHROPLASTY;  Surgeon: Meredith Pel, MD;  Location: [place];  Service: Orthopedics;  Laterality: Left;   SHOULDER ARTHROSCOPY WITH OPEN ROTATOR CUFF REPAIR AND DISTAL CLAVICLE ACROMINECTOMY Right 11/26/2013   Procedure: RIGHT SHOULDER ARTHROSCOPY WITH MINI OPEN ROTATOR CUFF REPAIR AND DISTAL CLAVICLE RESECTION,  SUBACROMIAL DECOMPRESSION, POSSIBLE [facility] PATCH.;  Surgeon: [name], MD;  Location: [place];  Service: Orthopedics;  Laterality: Right;   TEE WITHOUT CARDIOVERSION N/A 02/16/2021   Procedure: TRANSESOPHAGEAL ECHOCARDIOGRAM (TEE);  Surgeon: Acie Fredrickson Wonda Cheng, MD;  Location: [facility] ENDOSCOPY;  Service: Cardiovascular;  Laterality: N/A;   TOTAL KNEE ARTHROPLASTY Right 03/21/2017   TOTAL KNEE ARTHROPLASTY Right 03/21/2017   Procedure: RIGHT TOTAL KNEE ARTHROPLASTY;  Surgeon: [name], MD;  Location: [place];  Service: Orthopedics;  Laterality: Right;   TUBAL LIGATION      There were no vitals filed for this visit.   Subjective Assessment - 04/20/21 1510     Subjective Pt reports L knee pain is currently a 6-7/10, and reports it increased following interventions  last session. Pt thinks pain is muscular. She reports pain is sharp in nature. She reports pain is throbbing at rest. She reports she has had this pain before, but "not all the time."    Pertinent History [name] is a 40yoF who presents to OPPT on 04/14/21 for evaluation.   Pt evaluated by PT at [facility] on 11/7 while admitted for acute unsteadiness, Rt facial droop, imaging revealing of acute Lt CVA, remote infarcts seen bilat, pt scored 13/24 DG on 02/15/21. PT has been working with [facility] here since 02/25/21.  PMH: HTN, Rt RCR, Lt TSA, bilat TKA.    Limitations House hold activities;Lifting    How long can you sit comfortably? unlimited    How long can you stand comfortably? Can stand through all tasks, but is easily fatigued thereafter    How long can you walk comfortably? A regular grocery trip    Diagnostic tests Xrays    Patient Stated Goals Being able to complete daily tasks    Pain Score 7     Pain Location Knee    Pain Orientation Left    Pain Descriptors / Indicators Throbbing;Sharp    Pain Onset --    Pain Onset More than a month ago            INTERVENTIONS  PT examines LLE/L knee as pt reports increased pain today. Pt L knee is not TTP posteriorly, anteriorly, medially or laterally. PT LLE skin appears WNL. Pt gait is antalgic and reports most pain felt with ambulation/weightbearing.   Ice donned to L knee for majority of interventions. Pt reports improvement in pain with use of ice. Once ice removed skin appears WNL and no adverse reaction to treatment observed or reported.   Seated LAQ 2x10 alternating; pt rates medium difficulty. Pt does report mild LLE lateral pain.  Seated march 2x10 alternating. Pt reports feeling pain in "back of knee" and feels like it is muscular.   Seated manually resisted knee flexion isometric 10x with 3 sec hold for pain modulation  Seated manually resisted knee extension isometric 10x with 3 sec hold; pt rates medium, performed for pain  modulation  RTB seated hip abduction 2x10   At support surface- CGA provided throughout and gait belt donned  EC airex 2x30 sec Standing on airex, EO, pt performs vertical and horizontal head turns x multiple reps of each. --pt progresses to performing diagonal head turns. Pt reports greater difficulty looking up to Rt and down to Lt.  Tandem stance on airex 2x30 sec each LE; significant sway, hip strategy   Amb over 10 meters EC -no deviation   Amb over 10 meters Diagonals: Lt to Rt 1x, Rt to Lt 2x More challenged  with Rt to Lt, exhibits cross over step.   NBOS EC on airex with cog dual task, counting backwards, naming animals, and describing kayaking (pt reports this was once her hobby). These interventions were performed for several minutes. Pt did exhibit increased sway.   Standing on airex, EC, NBOS, pt performs vertical and horizontal head turns 10x for each. Pt is more challenged with vertical head turns and requires up to min assist due to unsteadiness.   Pt educated throughout session about proper posture and technique with exercises. Improved exercise technique, movement at target joints, use of target muscles after min to mod verbal, visual, tactile cues.     PT Education - 04/20/21 1923     Education Details exercise technique, body mechanics    Person(s) Educated Patient    Methods Explanation;Demonstration;Verbal cues;Tactile cues    Comprehension Verbalized understanding;Returned demonstration;Verbal cues required;Need further instruction              PT Short Term Goals - 04/14/21 1637       PT SHORT TERM GOAL #1   Title Pt will be independent with HEP in order to improve strength and endurance for mobility in community.    Baseline maxed out with grocery shopping    Time 2    Period Weeks    Target Date 04/28/21               PT Long Term Goals - 04/14/21 1638       PT LONG TERM GOAL #1   Title Pt to improve FOTO survey score from 55 to >60  to represent improved perception of facility in basic movement.    Baseline FOTO 55    Time 6    Period Weeks    Status New    Target Date 05/26/21      PT LONG TERM GOAL #2   Title Pt to demonstrate improved Left SLSL >20 sec without UE support.    Baseline 04/14/21: >20sec Rt; <12sec Left.    Time 6    Period Weeks    Status New    Target Date 05/26/21      PT LONG TERM GOAL #3   Title Pt to increase 6MWT from 910f to >13013fto improve ergonomic and tolerance in community mobility for IADL.    Baseline 95028f  Time 6    Period Weeks    Status New    Target Date 05/26/21      PT LONG TERM GOAL #4   Title Pt to demonstrate ability to transition from floor to lying down to floor.    Baseline Eval: pt struggles with getting off floor due to acute on chronic DJD and weakness in 3 limbs.    Time 6    Period Weeks    Status New    Target Date 05/26/21                   Plan - 04/20/21 1935     Clinical Impression Statement Therex regressed today as pt reported increased L knee pain following previous session and exhibited antalgic gait. Pt was not tender to palpation and L knee skin appeared WNL. Pt did experience a mild increase in pain with initial therex. By end of session patient reported improved L knee pain and exhibited improved gait mechanics. Pt reported she thinks the ice helped. Other part of session focused on balance interventions. Pt was most challenged by Rt to Lt diagonal head  turnd and tandem stance. The pt will benefit from further skilled PT to improve LE strength, endurance, and balance to increase QOL and decrease fall risk.    Personal Factors and Comorbidities Age;Behavior Pattern;Fitness;Past/Current Experience    Examination-Activity Limitations Locomotion Level;Carry;Stairs;Stand    Examination-Participation Restrictions Driving    Stability/Clinical Decision Making Stable/Uncomplicated    Rehab Potential Good    PT Frequency 2x / week    PT  Duration 6 weeks    PT Treatment/Interventions ADLs/Self Care Home Management;Electrical Stimulation;Therapeutic activities;Passive range of motion;Manual techniques;Patient/family education;Therapeutic exercise;Iontophoresis 34m/ml Dexamethasone;Moist Heat;Traction;Cryotherapy;Ultrasound;Functional mobility training;Neuromuscular re-education;Balance training;Dry needling;Joint Manipulations;Stair training;Gait training;DME Instruction    PT Next Visit Plan progress endurance with overground AMB, strenghtening, higher level balance    PT Home Exercise Plan ?Access Code: [email or web address] no updates - check in with pt for exercise response to determine progression or modification of interventions    Consulted and Agree with Plan of Care Patient             Patient will benefit from skilled therapeutic intervention in order to improve the following deficits and impairments:  Decreased endurance, Decreased strength, Decreased coordination, Improper body mechanics, Cardiopulmonary status limiting activity, Decreased activity tolerance, Decreased balance, Decreased mobility, Postural dysfunction  Visit Diagnosis: Unsteadiness on feet  Muscle weakness (generalized)  Pain in left leg     Problem List Patient Active Problem List   Diagnosis Date Noted   [place] (intracerebral hemorrhage) ([place] 02/13/2021   Iron deficiency 10/07/2020   Aortic atherosclerosis ([place] 03/17/2020   Lung nodule seen on imaging study 02/24/2020   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   Chronic left shoulder pain 08/23/2018   Chronic diarrhea 12/07/2014   Segmental colitis with rectal bleeding ([place] 09/12/2014   Routine general medical examination at a health care facility 06/05/2014   Overweight 06/13/2011   Hyperlipidemia 02/17/2007   Essential hypertension 01/08/2007   Osteoarthritis 01/08/2007    HZollie Pee PT 04/20/2021, 7:39 PM  [place] [place] [address]Phone: [phone]  Fax:  [phone] Name: Stacy SWETZMRN: [id number] of Birth: [birth year]

## 2021-04-21 ENCOUNTER — Ambulatory Visit (INDEPENDENT_AMBULATORY_CARE_PROVIDER_SITE_OTHER): Payer: Medicare Other | Admitting: *Deleted

## 2021-04-21 DIAGNOSIS — I10 Essential (primary) hypertension: Secondary | ICD-10-CM

## 2021-04-21 DIAGNOSIS — I610 Nontraumatic intracerebral hemorrhage in hemisphere, subcortical: Secondary | ICD-10-CM

## 2021-04-21 DIAGNOSIS — E782 Mixed hyperlipidemia: Secondary | ICD-10-CM

## 2021-04-21 NOTE — Chronic Care Management (AMB) (Signed)
Chronic Care Management   CCM RN Visit Note  04/21/2021 Name: [name] MRN: [id number] DOB: 20-Mar-1951  Subjective: [name] is a 71 y.o. year old female who is a primary care patient of Hoyt Koch, MD. The care management team was consulted for assistance with disease management and care coordination needs.    Engaged with patient by telephone for follow up visit in response to provider referral for case management and/or care coordination services.   Consent to Services:  The patient was given information about Chronic Care Management services, agreed to services, and gave verbal consent prior to initiation of services.  Please see initial visit note for detailed documentation.  Patient agreed to services and verbal consent obtained.   Assessment: Review of patient past medical history, allergies, medications, health status, including review of consultants reports, laboratory and other test data, was performed as part of comprehensive evaluation and provision of chronic care management services.   SDOH (Social Determinants of Health) assessments and interventions performed:  SDOH Interventions    Flowsheet Row Most Recent Value  SDOH Interventions   Food Insecurity Interventions Intervention Not Indicated  [denies food insecurity]  Transportation Interventions Intervention Not Indicated  [continues driving self,  husband can assist if indicated/ needed]      CCM Care Plan Allergies  Allergen Reactions   Eggs Or Egg-Derived Products Other (See Comments)    GI UPSET, JOINT PAIN, DIFF. SWALLOWING   Etodolac Other (See Comments)    ABD. CRAMPING   Fish-Derived Products Other (See Comments)    GI UPSET, JOINT PAIN, DIFF. SWALLOWING    Omeprazole Other (See Comments)    ABD. CRAMPING   Pineapple Other (See Comments)    GI UPSET, JOINT PAIN, DIFF. SWALLOWING   Gluten Meal Diarrhea    GI upset   Outpatient Encounter Medications as of 04/21/2021   Medication Sig   Ascorbic Acid (VITAMIN C) 1000 MG tablet Take 1,000 mg by mouth 2 (two) times daily.   aspirin EC 81 MG EC tablet Take 1 tablet (81 mg total) by mouth daily. Swallow whole.   atenolol-chlorthalidone (TENORETIC) 50-25 MG tablet Take 1 tablet by mouth daily.   b complex vitamins tablet Take 1 tablet by mouth at bedtime.   Calcium Carbonate-Vitamin D (CALCIUM + D PO) Take 1 tablet by mouth at bedtime.   CHELATED MAGNESIUM PO Take 3 tablets by mouth every morning.   Cholecalciferol (D 5000) 5000 units capsule Take 5,000 Units by mouth 3 (three) times daily.   Coenzyme Q10 (COQ10) 100 MG CAPS Take 100 mg by mouth every evening.    diphenoxylate-atropine (LOMOTIL) 2.5-0.025 MG tablet TAKE 1 TABLET BY MOUTH FOUR TIMES A DAY AS NEEDED FOR DIARRHEA OR LOOSE STOOLS (Patient taking differently: Take 1 tablet by mouth 4 (four) times daily as needed for diarrhea or loose stools.)   Ferrous Sulfate (IRON) 325 (65 Fe) MG TABS Take 1 tablet (325 mg total) by mouth 2 (two) times daily. (Patient taking differently: Take 325 mg by mouth every morning.)   Fiber POWD Take 5 mLs by mouth 2 (two) times daily. Heather's Tummy - mix in water and drink   folic acid (FOLVITE) 937 MCG tablet Take 800 mcg by mouth every evening.    hyoscyamine (LEVSIN SL) 0.125 MG SL tablet Take 1-2 tablets by mouth every 4 hours as needed (Patient taking differently: Take 0.125 mg by mouth 2 (two) times daily as needed (stomach cramps).)   ketotifen (ZADITOR) 0.025 %  ophthalmic solution Place 2 drops into both eyes 2 (two) times daily as needed (allergies).   lisinopril (ZESTRIL) 2.5 MG tablet TAKE 1 TABLET DAILY   Melatonin 3 MG TABS Take 3 mg by mouth at bedtime.    methocarbamol (ROBAXIN) 500 MG tablet TAKE 1 TABLET BY MOUTH EVERY 8 HOURS AS NEEDED FOR MUSCLE SPASMS (Patient taking differently: Take 500 mg by mouth every 8 (eight) hours as needed for muscle spasms.)   Multiple Vitamin (MULTIVITAMIN WITH MINERALS) TABS  tablet Take 1 tablet by mouth at bedtime.   OVER THE COUNTER MEDICATION Take 5 drops by mouth at bedtime. Lemon Balm   OVER THE COUNTER MEDICATION Take 1 Dose by mouth at bedtime. Cats Claw liquid daily   OVER THE COUNTER MEDICATION Apply 1 application topically at bedtime. Magnesium Lotion   polyvinyl alcohol (LIQUIFILM TEARS) 1.4 % ophthalmic solution Place 1 drop into both eyes daily as needed for dry eyes.   potassium chloride SA (KLOR-CON M20) 20 MEQ tablet Take 1 tablet (20 mEq total) by mouth 2 (two) times daily.   Probiotic Product (PROBIOTIC DAILY PO) Take 1 capsule by mouth 2 (two) times daily.   simvastatin (ZOCOR) 20 MG tablet Take 1 tablet (20 mg total) by mouth daily.   Sodium Fluoride (SODIUM FLUORIDE 5000 PPM) 1.1 % PSTE Place 1 application onto teeth at bedtime.   Zinc Sulfate (ZINC 15 PO) Take 10 drops by mouth at bedtime. Liquid Blend 10 drops equal 15 mg   No facility-administered encounter medications on file as of 04/21/2021.   Patient Active Problem List   Diagnosis Date Noted   ICH (intracerebral hemorrhage) ([place]) 02/13/2021   Iron deficiency 10/07/2020   Aortic atherosclerosis ([place]) 03/17/2020   Lung nodule seen on imaging study 02/24/2020   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   Chronic left shoulder pain 08/23/2018   Chronic diarrhea 12/07/2014   Segmental colitis with rectal bleeding ([place]) 09/12/2014   Routine general medical examination at a health care facility 06/05/2014   Overweight 06/13/2011   Hyperlipidemia 02/17/2007   Essential hypertension 01/08/2007   Osteoarthritis 01/08/2007   Conditions to be addressed/monitored:  HTN and HLD  Care Plan : RN care Manager Plan of Care  Updates made by Knox Royalty, RN since 04/21/2021 12:00 AM     Problem: Chronic Disease Management Needs   Priority: High     Long-Range Goal: Development of plan of care for long term chronic disease management   Start Date: 03/12/2021  Expected End Date:  03/12/2022  Priority: High  Note:   Current Barriers:  Chronic Disease Management support and education needs related to HTN and HLD Recent hospitalization November 5-8, 2022 for intracerebral hemorrhage/ CVA  RNCM Clinical Goal(s):  Patient will demonstrate ongoing health management independence as evidenced by HLD/ HTN; CVA        through collaboration with RN Care manager, provider, and care team.   Interventions: 1:1 collaboration with primary care provider regarding development and update of comprehensive plan of care as evidenced by provider attestation and co-signature Inter-disciplinary care team collaboration (see longitudinal plan of care) Evaluation of current treatment plan related to  self management and patient's adherence to plan as established by provider Initial assessment completed 03/12/21 SDOH updated- no concerns identified Confirmed patient continues attending outpatient neuro OT/ PT sessions: reports "going fine;" states physical therapy sessions have recently started-- reports occasional/ intermittent (L) knee pain, which is chronic in nature, but has increased slightly since PT sessions have  started: reports treating conservatively- occasionally takes tylenol, alternating heat/ ice; reports manageable; "getting better this week from last week" Pain assessment updated Confirmed no new/ recent falls since last outreach 03/12/21; has not fallen since August 2022; does not routinely use cane or other assistive devices  Hyperlipidemia:  (Status: Goal on Track (progressing): YES.) Long Term Goal  Lab Results  Component Value Date   CHOL 136 04/07/2021   HDL 44 04/07/2021   LDLCALC 74 04/07/2021   TRIG 97 04/07/2021   CHOLHDL 3.1 04/07/2021    Confirmed no recent changes in medications-- per patient request, I asked Dr. Sharlet Salina to send 29- day prescriptions for Simvastatin, Atenolol, lisinopril, and potassium to the [facility] mail order [facility]-- patient reports she  prefers 90-day supplies through mail order pharmacy for routinely prescribed medications; she prefers to not have to go to outpatient local pharmacy for routine medications Counseled on importance of regular laboratory monitoring as prescribed; Reviewed role and benefits of statin for ASCVD risk reduction; Reviewed importance of limiting foods high in cholesterol; Reviewed exercise goals and target of 150 minutes per week; Screening for signs and symptoms of depression related to chronic disease state;  Assessed social determinant of health barriers;   Hypertension: (Status: Goal on Track (progressing): YES.)  Long Term Goal Last practice recorded BP readings:  BP Readings from Last 3 Encounters:  04/19/21 98/60  04/07/21 117/80  02/23/21 110/70  Most recent eGFR/CrCl: No results found for: EGFR  No components found for: CRCL  Evaluation of current treatment plan related to hypertension self management and patient's adherence to plan as established by provider;   Provided education to patient re: stroke prevention, s/s of heart attack and stroke; Reviewed prescribed diet GI diet for chronic colitis; low salt, heart healthy, low cholesterol Discussed plans with patient for ongoing care management follow up and provided patient with direct contact information for care management team; Discussed complications of poorly controlled blood pressure such as heart disease, stroke, circulatory complications, vision complications, kidney impairment, sexual dysfunction;  Reviewed with patient recent provider office visits: 04/07/21: neuro provider; 04/19/21- cardiology provider-- no changes made to overall plan of care, reports "got good reports" from both providers Reviewed upcoming provider office visits: ongoing neuro OT/ PT rehabilitation sessions-- through March 2023; neurology provider office visit: 09/07/21; PCP physical 09/24/21 Confirmed patient monitoring/ recording blood pressures at home BID;  reviewed with patient-- reports consistent blood pressures between 120-130/50-80; encouraged her ongoing home monitoring  Patient Goals/Self-Care Activities: As evidenced by review of EHR, collaboration with care team, and patient reporting during CCM RN CM outreach,  Patient Axie will: Take medications as prescribed Attend all scheduled provider appointments Call pharmacy for medication refills Call provider office for new concerns or questions Continue to check blood pressures at home- the blood pressures you have reported today are in good range Continue to follow heart healthy, low salt, low cholesterol, GI, low sugar diet Continue participating in your outpatient rehabilitation sessions for occupational and physical therapy Keep up the great work preventing falls    Plan: Telephone follow up appointment with care management team member scheduled for:  Monday, September 27, 2021 at 3:00 pm The patient has been provided with contact information for the care management team and has been advised to call with any health related questions or concerns  Oneta Rack, RN, BSN, [place] [phone]: direct office

## 2021-04-21 NOTE — Patient Instructions (Signed)
Visit Grawn, thank you for taking time to talk with me today. Please don't hesitate to contact me if I can be of assistance to you before our next scheduled telephone appointment.  As you requested, I have asked Dr. Sharlet Salina to send your 90- day prescriptions for Simvastatin, Atenolol, lisinopril, and potassium to the [facility] mail order pharmacy  Below are the goals we discussed today:  Patient Self-Care Activities: Patient [name] will: Take medications as prescribed Attend all scheduled provider appointments Call pharmacy for medication refills Call provider office for new concerns or questions Continue to check blood pressures at home- the blood pressures you have reported today are in good range Continue to follow heart healthy, low salt, low cholesterol, GI, low sugar diet Continue participating in your outpatient rehabilitation sessions for occupational and physical therapy Keep up the great work preventing falls  Our next scheduled telephone follow up visit/ appointment with care management team member is scheduled on:  Monday, September 27, 2021 at 3:00 pm  If you need to cancel or re-schedule our visit, please call [phone] and our care guide team will be happy to assist you.   I look forward to hearing about your progress.   Oneta Rack, RN, BSN, [place] [phone]: direct office  If you are experiencing a Mental Health or [place] or need someone to talk to, please  call the Suicide and Crisis Lifeline: 988 call the [place] National Suicide Prevention Lifeline: 330-679-3407 or TTY: 726-694-7320 TTY (219) 167-9382) to talk to a trained counselor call 1-800-273-TALK (toll free, 24 hour hotline) go to [facility] [address], [place] [phone]) call 911   Patient verbalizes understanding of instructions and care plan provided today  and agrees to view in [place]. Active MyChart status confirmed with patient   Understanding Your Risk for Falls Each year, millions of people have serious injuries from falls. It is important to understand your risk for falling. Talk with your health care provider about your risk and what you can do to lower it. There are actions you can take at home to lower your risk and prevent falls. If you do have a serious fall, make sure to tell your health care provider. Falling once raises your risk of falling again. How can falls affect me? Serious injuries from falls are common. These include: Broken bones, such as hip fractures. Head injuries, such as traumatic brain injuries (TBI) or concussion. A fear of falling can cause you to avoid activities and stay at home. This can make your muscles weaker and actually raise your risk for a fall. What can increase my risk? There are a number of risk factors that increase your risk for falling. The more risk factors you have, the higher your risk of falling. Serious injuries from a fall happen most often to people older than age 28. Children and young adults ages 8-29 are also at higher risk. Common risk factors include: Weakness in the lower body. Lack (deficiency) of vitamin D. Being generally weak or confused due to long-term (chronic) illness. Dizziness or balance problems. Poor vision. Medicines that cause dizziness or drowsiness. These can include medicines for your blood pressure, heart, anxiety, insomnia, or edema, as well as pain medicines and muscle relaxants. Other risk factors include: Drinking alcohol. Having had a fall in the past. Having depression. Having foot pain or wearing improper footwear. Working at a dangerous job. Having  any of the following in your home: Tripping hazards, such as floor clutter or loose rugs. Poor lighting. Pets. Having dementia or memory loss. What actions can I take to lower my risk of falling?   Physical  activity Maintain physical fitness. Do strength and balance exercises. Consider taking a regular class to build strength and balance. Yoga and tai chi are good options. Vision Have your eyes checked every year and your vision prescription updated as needed. Walking aids and footwear Wear nonskid shoes. Do not wear high heels. Do not walk around the house in socks or slippers. Use a cane or walker as told by your health care provider. Home safety Attach secure railings on both sides of your stairs. Install grab bars for your tub, shower, and toilet. Use a bath mat in your tub or shower. Use good lighting in all rooms. Keep a flashlight near your bed. Make sure there is a clear path from your bed to the bathroom. Use night-lights. Do not use throw rugs. Make sure all carpeting is taped or tacked down securely. Remove all clutter from walkways and stairways, including extension cords. Repair uneven or broken steps. Avoid walking on icy or slippery surfaces. Walk on the grass instead of on icy or slick sidewalks. Use ice melt to get rid of ice on walkways. Use a cordless phone. Questions to ask your health care provider Can you help me check my risk for a fall? Do any of my medicines make me more likely to fall? Should I take a vitamin D supplement? What exercises can I do to improve my strength and balance? Should I make an appointment to have my vision checked? Do I need a bone density test to check for weak bones or osteoporosis? Would it help to use a cane or a walker? Where to find more information Centers for Disease Control and Prevention, STEAD[employer]: http://www.wolf.info/ Community-Based Fall Prevention Programs: http://www.wolf.info/ National Institute on Aging: http://kim-miller.com/ Contact a health care provider if: You fall at home. You are afraid of falling at home. You feel weak, drowsy, or dizzy. Summary Serious injuries from a fall happen most often to people older than age 63. Children and young  adults ages 33-29 are also at higher risk. Talk with your health care provider about your risks for falling and how to lower those risks. Taking certain precautions at home can lower your risk for falling. If you fall, always tell your health care provider. This information is not intended to replace advice given to you by your health care provider. Make sure you discuss any questions you have with your health care provider. Document Revised: 10/30/2019 Document Reviewed: 10/30/2019 Elsevier Patient Education  [place].

## 2021-04-22 ENCOUNTER — Other Ambulatory Visit: Payer: Self-pay

## 2021-04-22 ENCOUNTER — Ambulatory Visit: Payer: Medicare Other

## 2021-04-22 DIAGNOSIS — R2681 Unsteadiness on feet: Secondary | ICD-10-CM | POA: Diagnosis not present

## 2021-04-22 DIAGNOSIS — R278 Other lack of coordination: Secondary | ICD-10-CM | POA: Diagnosis not present

## 2021-04-22 DIAGNOSIS — M6281 Muscle weakness (generalized): Secondary | ICD-10-CM

## 2021-04-22 DIAGNOSIS — R2689 Other abnormalities of gait and mobility: Secondary | ICD-10-CM | POA: Diagnosis not present

## 2021-04-22 DIAGNOSIS — M25562 Pain in left knee: Secondary | ICD-10-CM | POA: Diagnosis not present

## 2021-04-22 DIAGNOSIS — M79605 Pain in left leg: Secondary | ICD-10-CM

## 2021-04-22 DIAGNOSIS — M25612 Stiffness of left shoulder, not elsewhere classified: Secondary | ICD-10-CM | POA: Diagnosis not present

## 2021-04-22 MED ORDER — POTASSIUM CHLORIDE CRYS ER 20 MEQ PO TBCR: 20.0000 meq | EXTENDED_RELEASE_TABLET | Freq: Two times a day (BID) | ORAL | 1 refills | Status: DC

## 2021-04-22 MED ORDER — LISINOPRIL 2.5 MG PO TABS: 2.5000 mg | ORAL_TABLET | Freq: Every day | ORAL | 1 refills | Status: DC

## 2021-04-22 MED ORDER — SIMVASTATIN 20 MG PO TABS: 20.0000 mg | ORAL_TABLET | Freq: Every day | ORAL | 1 refills | Status: DC

## 2021-04-22 MED ORDER — ATENOLOL-CHLORTHALIDONE 50-25 MG PO TABS: 1.0000 | ORAL_TABLET | Freq: Every day | ORAL | 1 refills | Status: DC

## 2021-04-22 NOTE — Therapy (Signed)
[place] [facility] SERVICES [address] [place], [place], [address] Phone: [phone]   Fax:  [phone]  Occupational Therapy Treatment  Patient Details  Name: [name] MRN: [id number] Date of Birth: October 28, 1950 Referring Provider (OT): Dr. Antony Contras   Encounter Date: 04/22/2021   OT End of Session - 04/22/21 1436     Visit Number 15    Number of Visits 21    Date for OT Re-Evaluation 05/08/21    Authorization Time Period Reporting period beginning 04/01/21    OT Start Time 1345    OT Stop Time 1430    OT Time Calculation (min) 45 min    Activity Tolerance Patient tolerated treatment well    Behavior During Therapy [facility] for tasks assessed/performed             Past Medical History:  Diagnosis Date   Arthritis    back, fingers with joint pain and swelling.  chronic back pain   Cataract    Chronic back pain    arthritis    Diverticulosis    Elevated cholesterol    takes Niacin daily and Simvastatin   GERD (gastroesophageal reflux disease) 06/2004   non-specific gastritis on EGD 06/2004   Headache(784.0)    occasionally   History of colon polyps 2004, 2009   2004:adenomatous. 2009 hyperplastic.    History of hiatal hernia    Hypertension    takes Tenoretic and Lisinopril daily   Mucoid cyst of joint 08/2013   right index finger   Osteopenia 01/2014   T score -1.1 FRAX 14%/0.5%. Stable from prior DEXA   PONV (postoperative nausea and vomiting)    Rotator cuff arthropathy    Left   Seasonal allergies    Stroke ([place]) 1998   x 2 - mild left-sided weakness   Urge incontinence    Uterine prolapse     Past Surgical History:  Procedure Laterality Date   BACK SURGERY     x 2   BELPHAROPTOSIS REPAIR Bilateral 12/14/2017   BUBBLE STUDY  02/16/2021   Procedure: BUBBLE STUDY;  Surgeon: Thayer Headings, MD;  Location: [facility] ENDOSCOPY;  Service: Cardiovascular;;   CATARACT EXTRACTION     CATARACT EXTRACTION  Bilateral 10/2017   COLONOSCOPY  2004, 2009, 2014   [place]     HYSTEROSCOPY WITH D & C  12/07/2010   with resection of endometrial polyp   JOINT REPLACEMENT     KNEE ARTHROSCOPY Left 2008   lip biopsy     done at MD office Fri 11/22/13   MASS EXCISION Right 08/15/2013   Procedure: RIGHT INDEX EXCISION MASS ;  Surgeon: Tennis Must, MD;  Location: [place];  Service: Orthopedics;  Laterality: Right;   NASAL SEPTUM SURGERY     OOPHORECTOMY Right 2000   REVERSE SHOULDER ARTHROPLASTY Left 12/11/2018   REVERSE SHOULDER ARTHROPLASTY Left 12/11/2018   Procedure: LEFT REVERSE SHOULDER ARTHROPLASTY;  Surgeon: Meredith Pel, MD;  Location: [place];  Service: Orthopedics;  Laterality: Left;   SHOULDER ARTHROSCOPY WITH OPEN ROTATOR CUFF REPAIR AND DISTAL CLAVICLE ACROMINECTOMY Right 11/26/2013   Procedure: RIGHT SHOULDER ARTHROSCOPY WITH MINI OPEN ROTATOR CUFF REPAIR AND DISTAL CLAVICLE RESECTION, SUBACROMIAL DECOMPRESSION, POSSIBLE [facility] PATCH.;  Surgeon: [name], MD;  Location: [place];  Service: Orthopedics;  Laterality: Right;   TEE WITHOUT CARDIOVERSION N/A 02/16/2021   Procedure: TRANSESOPHAGEAL ECHOCARDIOGRAM (  TEE);  Surgeon: Acie Fredrickson Wonda Cheng, MD;  Location: [facility] ENDOSCOPY;  Service: Cardiovascular;  Laterality: N/A;   TOTAL KNEE ARTHROPLASTY Right 03/21/2017   TOTAL KNEE ARTHROPLASTY Right 03/21/2017   Procedure: RIGHT TOTAL KNEE ARTHROPLASTY;  Surgeon: [name], MD;  Location: [place];  Service: Orthopedics;  Laterality: Right;   TUBAL LIGATION      There were no vitals filed for this visit.   Subjective Assessment - 04/22/21 1356     Subjective  "I was really sore over the weekend it was hard to walk."    Pertinent History Recent CVA on 02/13/21 affecting R side, hx of CVA x2 in 1998 affecting L side, hx of R TKA, L TSA, R rotator cuff repair    Limitations weakness, fatigue, impaired [facility]    Patient  Stated Goals "Improve my strength, endurance, and coordination."    Currently in Pain? Yes    Pain Score 4     Pain Location Knee    Pain Orientation Left    Pain Descriptors / Indicators Sharp;Aching    Pain Type Chronic pain    Pain Onset More than a month ago    Pain Frequency Constant    Aggravating Factors  standing, walking    Pain Relieving Factors rest, meds, heat    Effect of Pain on Daily Activities pain with mobility    Multiple Pain Sites Yes    Pain Score 3    Pain Location Shoulder    Pain Orientation Left;Right    Pain Descriptors / Indicators Aching    Pain Type Chronic pain    Pain Onset More than a month ago    Aggravating Factors  arthritic pain, raising arms above shoulder level    Pain Relieving Factors rest, heat, pain medications    Effect of Pain on Daily Activities pain with reaching overhead           Occupational Therapy Treatment: Therapeutic Exercise: Facilitated hand strengthening with use of hand gripper set at 11.2# to remove jumbo pegs from pegboard x2 trials each hand.  Increased resistance for shoulder and bicep strengthening, using 3 lb weight for bicep curls x7 reps R/L, and 2 lb dowel for bilat chest press, shoulder flex, abd, ER, ext, and IR x7 reps each.  BUE strengthening working to increase tolerance for UB ADLs and reaching activities during daily tasks.   Neuro re-ed: Practiced small item pick up with small glass stones, storing in hand, and discarding 1 at a time.  Able to pick up stones without non-skid surface today more efficiently from table top.  Practiced stacking jumbo pegs upright and placing stones on top to promote [place] skills with LUE, occasional bumping and collapse of pegs. [facility] further facilitated with construction of pieces from [place].  Increased shaking in L hand when fatigued and when arm is more outstretched.    Response to Treatment: See Plan/clinical impression below.    OT Education - 04/22/21  1436     Education Details HEP progression    Person(s) Educated Patient    Methods Explanation;Verbal cues;Demonstration;Tactile cues    Comprehension Verbalized understanding;Returned demonstration;Verbal cues required;Need further instruction              OT Short Term Goals - 03/30/21 0810       OT SHORT TERM GOAL #1   Title Pt will perform BUE HEP independently for increasing strenth and coordination for self care tasks.    Baseline Eval: initiated  theraputty exercises at eval; pt is indep with theraputty/additions with shoulder exercises requires vc    Time 3    Period Weeks    Status On-going    Target Date 04/21/21               OT Long Term Goals - 03/30/21 1158       OT LONG TERM GOAL #1   Title Pt will improve FOTO score to 80 or better to indicate increased functional performance.    Baseline Eval: FOTO 72 (questions answered related to RUE); 03/30/21 FOTO 54 (questions answered related to LUE)    Time 5    Period Weeks    Status On-going    Target Date 05/08/21      OT LONG TERM GOAL #2   Title Pt will improve bilat hand coordination to ease manipulation and efficiency with managing clothing fasteners.    Baseline Eval: extra time and effort to manage small buttons (R 9 hole peg test 30 sec, L 38 sec); 03/30/21: R 9 hole 27 sec, L 29, improving with clothing fasteners (extra time)    Time 5    Period Weeks    Status On-going    Target Date 05/08/21      OT LONG TERM GOAL #3   Title Pt will increase bilat hand strength by 5 or more lbs for improving grasp and manipulation of ADL supplies.    Baseline Eval: R grip 29 lbs, L grip 24 lbs; 03/2021: R grip 35 lbs, L grip 31 lbs    Time 5    Period Weeks    Status On-going    Target Date 05/08/21      OT LONG TERM GOAL #4   Title Pt will improve GMC bilaterally for improved accuracy and efficiency when reaching for ADL supplies.    Baseline Eval: mild BUE ataxia; Good accuracy with RUE reaching, mild  ataxia LUE and difficulty reaching above shoulder level for ADL supplies in either arm    Time 5    Period Weeks    Status On-going    Target Date 05/08/21      OT LONG TERM GOAL #5   Title Pt will increase activity tolerance for IADLs and community mobility indicated by RPE of 2 or better with these activities.    Baseline Eval: RPE of 5 with IADLs and community mobility; 03/30/2021: RPE 4    Time 5    Period Weeks    Status On-going    Target Date 05/08/21      Long Term Additional Goals   Additional Long Term Goals Yes      OT LONG TERM GOAL #6   Title Pt will increase bilat shoulder flex/abd strength to ease over head reach for ADL supplies.    Baseline 03/30/21: R/L shoulder flex/abd 4-/5    Time 5    Period Weeks    Status New    Target Date 05/08/21              Plan - 04/22/21 1446     Clinical Impression Statement Pt was able to increase resistance level today for bicep and shoulder strengthening, but completed fewer reps and fewer sets as to avoid too much increase in bilat shoulder pain.  Pt rated L shoulder at 3/10 pain this day, managed with moist heat and rest breaks intermittently throughout session.  Pt reports L arm is improving with coordination, but pt continues to notice some shaking still when  she is fatigued.  Pt will continue to benefit from skilled OT for increasing BUE strength and coordination in order to improve indep, functional reach, and efficiency with self care tasks.    OT Occupational Profile and History Detailed Assessment- Review of Records and additional review of physical, cognitive, psychosocial history related to current functional performance    Occupational performance deficits (Please refer to evaluation for details): ADL's;IADL's    Body Structure / Function / Physical Skills ADL;Dexterity;Strength;Coordination;FMC;Body mechanics;Endurance;UE functional use;GMC    Rehab Potential Excellent    Clinical Decision Making Several treatment  options, min-mod task modification necessary    Comorbidities Affecting Occupational Performance: May have comorbidities impacting occupational performance    Modification or Assistance to Complete Evaluation  No modification of tasks or assist necessary to complete eval    OT Frequency 2x / week    OT Duration 6 weeks    OT Treatment/Interventions Self-care/ADL training;Therapeutic exercise;DME and/or AE instruction;Neuromuscular education;Manual Therapy;Moist Heat;Energy conservation;Therapeutic activities;Patient/family education;Passive range of motion    Plan OT to address strength and coordination deficits bilaterally which impact self care performance    OT Home Exercise Plan issued pink theraputty at eval and instructed in exercises/handout issued    Recommended Other Services N/A    Consulted and Agree with Plan of Care Patient             Patient will benefit from skilled therapeutic intervention in order to improve the following deficits and impairments:   Body Structure / Function / Physical Skills: ADL, Dexterity, Strength, Coordination, FMC, Body mechanics, Endurance, UE functional use, GMC       Visit Diagnosis: Muscle weakness (generalized)  Other lack of coordination    Problem List Patient Active Problem List   Diagnosis Date Noted   ICH (intracerebral hemorrhage) ([place]) 02/13/2021   Iron deficiency 10/07/2020   Aortic atherosclerosis ([place]) 03/17/2020   Lung nodule seen on imaging study 02/24/2020   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

## 2021-04-22 NOTE — Therapy (Signed)
[place] MAIN [facility] SERVICES [address] [place], [place], [address] Phone: [phone]   Fax:  [phone]  Physical Therapy Treatment  Patient Details  Name: [name] MRN: [id number] Date of Birth: December 30, 1950 Referring Provider (PT): Antony Contras, MD   Encounter Date: 04/22/2021   PT End of Session - 04/22/21 1710     Visit Number 4    Number of Visits 13    Date for PT Re-Evaluation 05/26/21   Pt agreeable to 6-week period   Authorization Type Medicare primary, [employer] secondary    Authorization Time Period 04/14/21-05/26/21    Progress Note Due on Visit 10    PT Start Time 1431    PT Stop Time 1514    PT Time Calculation (min) 43 min    Equipment Utilized During Treatment Gait belt    Activity Tolerance Patient tolerated treatment well    Behavior During Therapy WFL for tasks assessed/performed             Past Medical History:  Diagnosis Date   Arthritis    back, fingers with joint pain and swelling.  chronic back pain   Cataract    Chronic back pain    arthritis    Diverticulosis    Elevated cholesterol    takes Niacin daily and Simvastatin   GERD (gastroesophageal reflux disease) 06/2004   non-specific gastritis on EGD 06/2004   Headache(784.0)    occasionally   History of colon polyps 2004, 2009   2004:adenomatous. 2009 hyperplastic.    History of hiatal hernia    Hypertension    takes Tenoretic and Lisinopril daily   Mucoid cyst of joint 08/2013   right index finger   Osteopenia 01/2014   T score -1.1 FRAX 14%/0.5%. Stable from prior DEXA   PONV (postoperative nausea and vomiting)    Rotator cuff arthropathy    Left   Seasonal allergies    Stroke ([place]) 1998   x 2 - mild left-sided weakness   Urge incontinence    Uterine prolapse     Past Surgical History:  Procedure Laterality Date   BACK SURGERY     x 2   BELPHAROPTOSIS REPAIR Bilateral 12/14/2017   BUBBLE STUDY  02/16/2021   Procedure: BUBBLE  STUDY;  Surgeon: Thayer Headings, MD;  Location: [facility] ENDOSCOPY;  Service: Cardiovascular;;   CATARACT EXTRACTION     CATARACT EXTRACTION Bilateral 10/2017   COLONOSCOPY  2004, 2009, 2014   [place]     HYSTEROSCOPY WITH D & C  12/07/2010   with resection of endometrial polyp   JOINT REPLACEMENT     KNEE ARTHROSCOPY Left 2008   lip biopsy     done at MD office Fri 11/22/13   MASS EXCISION Right 08/15/2013   Procedure: RIGHT INDEX EXCISION MASS ;  Surgeon: Tennis Must, MD;  Location: [place];  Service: Orthopedics;  Laterality: Right;   NASAL SEPTUM SURGERY     OOPHORECTOMY Right 2000   REVERSE SHOULDER ARTHROPLASTY Left 12/11/2018   REVERSE SHOULDER ARTHROPLASTY Left 12/11/2018   Procedure: LEFT REVERSE SHOULDER ARTHROPLASTY;  Surgeon: Meredith Pel, MD;  Location: [place];  Service: Orthopedics;  Laterality: Left;   SHOULDER ARTHROSCOPY WITH OPEN ROTATOR CUFF REPAIR AND DISTAL CLAVICLE ACROMINECTOMY Right 11/26/2013   Procedure: RIGHT SHOULDER ARTHROSCOPY WITH MINI OPEN ROTATOR CUFF REPAIR AND DISTAL CLAVICLE RESECTION, SUBACROMIAL DECOMPRESSION, POSSIBLE  [place].;  Surgeon: [name], MD;  Location: [place];  Service: Orthopedics;  Laterality: Right;   TEE WITHOUT CARDIOVERSION N/A 02/16/2021   Procedure: TRANSESOPHAGEAL ECHOCARDIOGRAM (TEE);  Surgeon: Acie Fredrickson Wonda Cheng, MD;  Location: [facility] ENDOSCOPY;  Service: Cardiovascular;  Laterality: N/A;   TOTAL KNEE ARTHROPLASTY Right 03/21/2017   TOTAL KNEE ARTHROPLASTY Right 03/21/2017   Procedure: RIGHT TOTAL KNEE ARTHROPLASTY;  Surgeon: [name], MD;  Location: [place];  Service: Orthopedics;  Laterality: Right;   TUBAL LIGATION      There were no vitals filed for this visit.   Subjective Assessment - 04/22/21 1432     Subjective Pt reports L knee pain is better today and rates it at a 4/10. Pt reports no LOB since last appointment.    Pertinent  History [name] is a 67yoF who presents to OPPT on 04/14/21 for evaluation.   Pt evaluated by PT at [facility] on 11/7 while admitted for acute unsteadiness, Rt facial droop, imaging revealing of acute Lt CVA, remote infarcts seen bilat, pt scored 13/24 DG on 02/15/21. PT has been working with [place] here since 02/25/21.  PMH: HTN, Rt RCR, Lt TSA, bilat TKA.    Limitations House hold activities;Lifting    How long can you sit comfortably? unlimited    How long can you stand comfortably? Can stand through all tasks, but is easily fatigued thereafter    How long can you walk comfortably? A regular grocery trip    Diagnostic tests Xrays    Patient Stated Goals Being able to complete daily tasks    Currently in Pain? Yes    Pain Location Leg    Pain Orientation Left    Pain Onset More than a month ago    Pain Onset More than a month ago            INTERVENTIONS     Ice donned to L knee for 10 min while pt performs therex. Pt reports improvement in pain with use of ice.    Seated LAQ 3x20 alternating; pt rates medium difficulty. Pt does report mild LLE lateral pain.   Seated march 3x10 alternating. Pt reports no pain this time.   Seated manually resisted knee extension isometric 10x with 3 sec hold; pt rates medium, performed for pain modulation  Standing hip abduction 10x each LE; pt reports discomfort in L knee, intervention discontinued.   Seated hip adduction with red p.ball 10x with 3 sec hold; pt reports no pain with intervention.   Seated heel raises 1x20, 1x10 B  Treadmill 0.8 mph, HR 103, pt rated it moderate. Pt reports no knee pain but does report fatigue.  X 6 minutes.  Seated hip abduction with side step over hurdle 2x10 each LE; pt rates as medium. Pt does not fully clear hurdle but does not knock it over.  Pt performs several reps of the following: CGA-min a provided throughout Agility ladder forward stepping for improved step length  Agility ladder side step - pt rates  as easy Agility ladder crossover step - pt rates as difficult. Requires CGA-min a  Pt reports knee pain remains unchanged at end of session.      Pt educated throughout session about proper posture and technique with exercises. Improved exercise technique, movement at target joints, use of target muscles after min to mod verbal, visual, tactile cues   PT Education - 04/22/21 1710     Education Details exercise technique, body mechanics  Person(s) Educated Patient    Methods Explanation;Demonstration;Tactile cues;Verbal cues    Comprehension Verbalized understanding;Returned demonstration;Verbal cues required;Need further instruction              PT Short Term Goals - 04/14/21 1637       PT SHORT TERM GOAL #1   Title Pt will be independent with HEP in order to improve strength and endurance for mobility in community.    Baseline maxed out with grocery shopping    Time 2    Period Weeks    Target Date 04/28/21               PT Long Term Goals - 04/14/21 1638       PT LONG TERM GOAL #1   Title Pt to improve FOTO survey score from 55 to >60 to represent improved perception of facility in basic movement.    Baseline FOTO 55    Time 6    Period Weeks    Status New    Target Date 05/26/21      PT LONG TERM GOAL #2   Title Pt to demonstrate improved Left SLSL >20 sec without UE support.    Baseline 04/14/21: >20sec Rt; <12sec Left.    Time 6    Period Weeks    Status New    Target Date 05/26/21      PT LONG TERM GOAL #3   Title Pt to increase 6MWT from 967f to >1301fto improve ergonomic and tolerance in community mobility for IADL.    Baseline 95031f  Time 6    Period Weeks    Status New    Target Date 05/26/21      PT LONG TERM GOAL #4   Title Pt to demonstrate ability to transition from floor to lying down to floor.    Baseline Eval: pt struggles with getting off floor due to acute on chronic DJD and weakness in 3 limbs.    Time 6    Period Weeks     Status New    Target Date 05/26/21                   Plan - 04/22/21 1711     Clinical Impression Statement Pt highly motivated to participate in session. She responds well to interventions on this date with reports of no increase from baseline pain at end of sessoin. Pt was able to tolerate increased volume of therex. The pt was most challenged with dynamic cross-over stepping agility ladder exercise where she required up to min a. The pt will benefit from further skilled PT to improve endurance, LE strength and balance to increase QOL and decrease fall risk.    Personal Factors and Comorbidities Age;Behavior Pattern;Fitness;Past/Current Experience    Examination-Activity Limitations Locomotion Level;Carry;Stairs;Stand    Examination-Participation Restrictions Driving    Stability/Clinical Decision Making Stable/Uncomplicated    Rehab Potential Good    PT Frequency 2x / week    PT Duration 6 weeks    PT Treatment/Interventions ADLs/Self Care Home Management;Electrical Stimulation;Therapeutic activities;Passive range of motion;Manual techniques;Patient/family education;Therapeutic exercise;Iontophoresis 4mg58m Dexamethasone;Moist Heat;Traction;Cryotherapy;Ultrasound;Functional mobility training;Neuromuscular re-education;Balance training;Dry needling;Joint Manipulations;Stair training;Gait training;DME Instruction    PT Next Visit Plan progress endurance with overground AMB, strenghtening, higher level balance    PT Home Exercise Plan ?Access Code: [email or web address] updates - check in with pt for exercise response to determine progression or modification of interventions    Consulted and Agree with Plan of Care Patient  Patient will benefit from skilled therapeutic intervention in order to improve the following deficits and impairments:  Decreased endurance, Decreased strength, Decreased coordination, Improper body mechanics, Cardiopulmonary status limiting activity,  Decreased activity tolerance, Decreased balance, Decreased mobility, Postural dysfunction  Visit Diagnosis: Muscle weakness (generalized)  Unsteadiness on feet  Other abnormalities of gait and mobility  Pain in left leg     Problem List Patient Active Problem List   Diagnosis Date Noted   ICH (intracerebral hemorrhage) ([place]) 02/13/2021   Iron deficiency 10/07/2020   Aortic atherosclerosis ([place]) 03/17/2020   Lung nodule seen on imaging study 02/24/2020   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   Chronic left shoulder pain 08/23/2018   Chronic diarrhea 12/07/2014   Segmental colitis with rectal bleeding ([place]) 09/12/2014   Routine general medical examination at a health care facility 06/05/2014   Overweight 06/13/2011   Hyperlipidemia 02/17/2007   Essential hypertension 01/08/2007   Osteoarthritis 01/08/2007    Zollie Pee, PT 04/22/2021, 5:15 PM  [place], [place], [address] Phone: [phone]   Fax:  [phone]  Name: BEVAN VU MRN: [id number] Date of Birth: [birth year]

## 2021-04-27 ENCOUNTER — Other Ambulatory Visit: Payer: Self-pay

## 2021-04-27 ENCOUNTER — Ambulatory Visit: Payer: Medicare Other

## 2021-04-27 DIAGNOSIS — M6281 Muscle weakness (generalized): Secondary | ICD-10-CM

## 2021-04-27 DIAGNOSIS — R2689 Other abnormalities of gait and mobility: Secondary | ICD-10-CM

## 2021-04-27 DIAGNOSIS — R278 Other lack of coordination: Secondary | ICD-10-CM | POA: Diagnosis not present

## 2021-04-27 DIAGNOSIS — M25612 Stiffness of left shoulder, not elsewhere classified: Secondary | ICD-10-CM | POA: Diagnosis not present

## 2021-04-27 DIAGNOSIS — R2681 Unsteadiness on feet: Secondary | ICD-10-CM | POA: Diagnosis not present

## 2021-04-27 DIAGNOSIS — M25562 Pain in left knee: Secondary | ICD-10-CM

## 2021-04-27 NOTE — Therapy (Signed)
[place] MAIN [facility] SERVICES [address] [place], [place], [address] Phone: [phone]   Fax:  [phone]  Occupational Therapy Treatment  Patient Details  Name: [name] MRN: [id number] Date of Birth: Nov 03, 1950 Referring Provider (OT): Dr. Antony Contras   Encounter Date: 04/27/2021   OT End of Session - 04/27/21 1000     Visit Number 16    Number of Visits 21    Date for OT Re-Evaluation 05/08/21    Authorization Time Period Reporting period beginning 04/01/21    OT Start Time 0930    OT Stop Time 1015    OT Time Calculation (min) 45 min    Activity Tolerance Patient tolerated treatment well    Behavior During Therapy [facility] for tasks assessed/performed             Past Medical History:  Diagnosis Date   Arthritis    back, fingers with joint pain and swelling.  chronic back pain   Cataract    Chronic back pain    arthritis    Diverticulosis    Elevated cholesterol    takes Niacin daily and Simvastatin   GERD (gastroesophageal reflux disease) 06/2004   non-specific gastritis on EGD 06/2004   Headache(784.0)    occasionally   History of colon polyps 2004, 2009   2004:adenomatous. 2009 hyperplastic.    History of hiatal hernia    Hypertension    takes Tenoretic and Lisinopril daily   Mucoid cyst of joint 08/2013   right index finger   Osteopenia 01/2014   T score -1.1 FRAX 14%/0.5%. Stable from prior DEXA   PONV (postoperative nausea and vomiting)    Rotator cuff arthropathy    Left   Seasonal allergies    Stroke ([place]) 1998   x 2 - mild left-sided weakness   Urge incontinence    Uterine prolapse     Past Surgical History:  Procedure Laterality Date   BACK SURGERY     x 2   BELPHAROPTOSIS REPAIR Bilateral 12/14/2017   BUBBLE STUDY  02/16/2021   Procedure: BUBBLE STUDY;  Surgeon: Thayer Headings, MD;  Location: [facility] ENDOSCOPY;  Service: Cardiovascular;;   CATARACT EXTRACTION     CATARACT EXTRACTION  Bilateral 10/2017   COLONOSCOPY  2004, 2009, 2014   [place]     HYSTEROSCOPY WITH D & C  12/07/2010   with resection of endometrial polyp   JOINT REPLACEMENT     KNEE ARTHROSCOPY Left 2008   lip biopsy     done at MD office Fri 11/22/13   MASS EXCISION Right 08/15/2013   Procedure: RIGHT INDEX EXCISION MASS ;  Surgeon: Tennis Must, MD;  Location: [place];  Service: Orthopedics;  Laterality: Right;   NASAL SEPTUM SURGERY     OOPHORECTOMY Right 2000   REVERSE SHOULDER ARTHROPLASTY Left 12/11/2018   REVERSE SHOULDER ARTHROPLASTY Left 12/11/2018   Procedure: LEFT REVERSE SHOULDER ARTHROPLASTY;  Surgeon: Meredith Pel, MD;  Location: [place];  Service: Orthopedics;  Laterality: Left;   SHOULDER ARTHROSCOPY WITH OPEN ROTATOR CUFF REPAIR AND DISTAL CLAVICLE ACROMINECTOMY Right 11/26/2013   Procedure: RIGHT SHOULDER ARTHROSCOPY WITH MINI OPEN ROTATOR CUFF REPAIR AND DISTAL CLAVICLE RESECTION, SUBACROMIAL DECOMPRESSION, POSSIBLE [facility] PATCH.;  Surgeon: [name], MD;  Location: [place];  Service: Orthopedics;  Laterality: Right;   TEE WITHOUT CARDIOVERSION N/A 02/16/2021   Procedure: TRANSESOPHAGEAL ECHOCARDIOGRAM (  TEE);  Surgeon: Acie Fredrickson Wonda Cheng, MD;  Location: [facility] ENDOSCOPY;  Service: Cardiovascular;  Laterality: N/A;   TOTAL KNEE ARTHROPLASTY Right 03/21/2017   TOTAL KNEE ARTHROPLASTY Right 03/21/2017   Procedure: RIGHT TOTAL KNEE ARTHROPLASTY;  Surgeon: [name], MD;  Location: [place];  Service: Orthopedics;  Laterality: Right;   TUBAL LIGATION      There were no vitals filed for this visit.   Subjective Assessment - 04/27/21 0931     Subjective  "The shoulder isn't that bad, it's really the knee."    Pertinent History Recent CVA on 02/13/21 affecting R side, hx of CVA x2 in 1998 affecting L side, hx of R TKA, L TSA, R rotator cuff repair    Limitations weakness, fatigue, impaired [facility]    Patient Stated  Goals "Improve my strength, endurance, and coordination."    Currently in Pain? Yes    Pain Score 7     Pain Location Knee    Pain Orientation Left    Pain Descriptors / Indicators Sharp;Aching    Pain Type Chronic pain    Pain Onset More than a month ago    Pain Frequency Constant    Aggravating Factors  standing, walking    Pain Relieving Factors rest, meds, heat    Effect of Pain on Daily Activities pain with mobility    Pain Score 3    Pain Location Shoulder    Pain Orientation Left    Pain Descriptors / Indicators Aching    Pain Type Chronic pain    Pain Onset More than a month ago    Pain Frequency Intermittent    Aggravating Factors  arthritic pain, raising arms above shoulder level    Pain Relieving Factors rest, heat, pain medications    Effect of Pain on Daily Activities pain with reaching overhead            Occupational Therapy Treatment: Therapeutic Exercise: Facilitated pinch strengthening with use of therapy resistant clothespins to target lateral and 3 point pinch of R/L hands, all colors.  Completed 2 trials each hand for each pinch type, with second trial facilitated shoulder flexion having pt clip pins onto vertical yardstick.  Facilitated hand strengthening with use of hand gripper set at min resistance with 1 red band to perform grip squeezes for R/L hands x5 sets 10 reps each.  Tried green band for R hand but pt felt this to be too much resistance.  Performed bicep strengthening, using 3 lb weight for bicep curls 2 sets 7 reps R/L, min guard for BUEs. Completed 2 lb dowel for bilat chest press, shoulder flex, abd, ER, ext, and IR 2 sets 7 reps each.  BUE strengthening working to increase tolerance for UB ADLs and reaching activities during daily tasks.  Min vc for posture and technique.  Practiced flipping and stacking circular blocks using L/R hands.  Still slight shaking on LUE but completed end of session and more fatigued.   Response to Treatment: See  Plan/clinical impression below.      OT Education - 04/27/21 1000     Education Details HEP progression    Person(s) Educated Patient    Methods Explanation;Verbal cues;Demonstration;Tactile cues    Comprehension Verbalized understanding;Returned demonstration;Verbal cues required;Need further instruction              OT Short Term Goals - 03/30/21 0810       OT SHORT TERM GOAL #1   Title Pt will perform BUE HEP independently  for increasing strenth and coordination for self care tasks.    Baseline Eval: initiated theraputty exercises at eval; pt is indep with theraputty/additions with shoulder exercises requires vc    Time 3    Period Weeks    Status On-going    Target Date 04/21/21               OT Long Term Goals - 03/30/21 1158       OT LONG TERM GOAL #1   Title Pt will improve FOTO score to 80 or better to indicate increased functional performance.    Baseline Eval: FOTO 72 (questions answered related to RUE); 03/30/21 FOTO 54 (questions answered related to LUE)    Time 5    Period Weeks    Status On-going    Target Date 05/08/21      OT LONG TERM GOAL #2   Title Pt will improve bilat hand coordination to ease manipulation and efficiency with managing clothing fasteners.    Baseline Eval: extra time and effort to manage small buttons (R 9 hole peg test 30 sec, L 38 sec); 03/30/21: R 9 hole 27 sec, L 29, improving with clothing fasteners (extra time)    Time 5    Period Weeks    Status On-going    Target Date 05/08/21      OT LONG TERM GOAL #3   Title Pt will increase bilat hand strength by 5 or more lbs for improving grasp and manipulation of ADL supplies.    Baseline Eval: R grip 29 lbs, L grip 24 lbs; 03/2021: R grip 35 lbs, L grip 31 lbs    Time 5    Period Weeks    Status On-going    Target Date 05/08/21      OT LONG TERM GOAL #4   Title Pt will improve GMC bilaterally for improved accuracy and efficiency when reaching for ADL supplies.     Baseline Eval: mild BUE ataxia; Good accuracy with RUE reaching, mild ataxia LUE and difficulty reaching above shoulder level for ADL supplies in either arm    Time 5    Period Weeks    Status On-going    Target Date 05/08/21      OT LONG TERM GOAL #5   Title Pt will increase activity tolerance for IADLs and community mobility indicated by RPE of 2 or better with these activities.    Baseline Eval: RPE of 5 with IADLs and community mobility; 03/30/2021: RPE 4    Time 5    Period Weeks    Status On-going    Target Date 05/08/21      Long Term Additional Goals   Additional Long Term Goals Yes      OT LONG TERM GOAL #6   Title Pt will increase bilat shoulder flex/abd strength to ease over head reach for ADL supplies.    Baseline 03/30/21: R/L shoulder flex/abd 4-/5    Time 5    Period Weeks    Status New    Target Date 05/08/21              Plan - 04/27/21 1117     Clinical Impression Statement Pt tolerated a second set with 2lb dowel today for shoulder strengthening, though fatigued and slightly sore, but no more than a 3/10 pain reported.  Pt continues to report slight shaking in LUE when reaching, but overall feels she is reaching with more accuracy when using LUE.  Pt is on target  to likely d/c end of next week.  Remaining visits will continue to focus on BUE strengthening and coordination in order to maximize indep and tolerance for ADLs/IADLs.    OT Occupational Profile and History Detailed Assessment- Review of Records and additional review of physical, cognitive, psychosocial history related to current functional performance    Occupational performance deficits (Please refer to evaluation for details): ADL's;IADL's    Body Structure / Function / Physical Skills ADL;Dexterity;Strength;Coordination;FMC;Body mechanics;Endurance;UE functional use;GMC    Rehab Potential Excellent    Clinical Decision Making Several treatment options, min-mod task modification necessary     Comorbidities Affecting Occupational Performance: May have comorbidities impacting occupational performance    Modification or Assistance to Complete Evaluation  No modification of tasks or assist necessary to complete eval    OT Frequency 2x / week    OT Duration 6 weeks    OT Treatment/Interventions Self-care/ADL training;Therapeutic exercise;DME and/or AE instruction;Neuromuscular education;Manual Therapy;Moist Heat;Energy conservation;Therapeutic activities;Patient/family education;Passive range of motion    Plan OT to address strength and coordination deficits bilaterally which impact self care performance    OT Home Exercise Plan issued pink theraputty at eval and instructed in exercises/handout issued    Recommended Other Services N/A    Consulted and Agree with Plan of Care Patient             Patient will benefit from skilled therapeutic intervention in order to improve the following deficits and impairments:   Body Structure / Function / Physical Skills: ADL, Dexterity, Strength, Coordination, FMC, Body mechanics, Endurance, UE functional use, GMC       Visit Diagnosis: Muscle weakness (generalized)  Other lack of coordination    Problem List Patient Active Problem List   Diagnosis Date Noted   ICH (intracerebral hemorrhage) ([place]) 02/13/2021   Iron deficiency 10/07/2020   Aortic atherosclerosis ([place]) 03/17/2020   Lung nodule seen on imaging study 02/24/2020   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   Chronic left shoulder pain 08/23/2018   Chronic diarrhea 12/07/2014   Segmental colitis with rectal bleeding ([place]) 09/12/2014   Routine general medical examination at a health care facility 06/05/2014   Overweight 06/13/2011   Hyperlipidemia 02/17/2007   Essential hypertension 01/08/2007   Osteoarthritis 01/08/2007   Leta Speller, MS, OTR/L  Darleene Cleaver, OT 04/27/2021, 11:18 AM  [place] [address] [place], [place], [address] Phone: [phone]   Fax:  [phone]  Name: HAEVEN NICKLE MRN: [id number] Date of Birth: Jul 06, 1950

## 2021-04-27 NOTE — Therapy (Signed)
[place] MAIN [facility] SERVICES [address] [place], [place], [address] Phone: [phone]   Fax:  [phone]  Physical Therapy Treatment  Patient Details  Name: [name] MRN: [id number] Date of Birth: 13-May-1950 Referring Provider (PT): Antony Contras, MD   Encounter Date: 04/27/2021   PT End of Session - 04/27/21 1407     Visit Number 5    Number of Visits 13    Date for PT Re-Evaluation 05/26/21   Pt agreeable to 6-week period   Authorization Type Medicare primary, [employer] secondary    Authorization Time Period 04/14/21-05/26/21    Progress Note Due on Visit 10    PT Start Time 1016    PT Stop Time 1058    PT Time Calculation (min) 42 min    Equipment Utilized During Treatment Gait belt    Activity Tolerance Patient tolerated treatment well;Patient limited by pain    Behavior During Therapy WFL for tasks assessed/performed             Past Medical History:  Diagnosis Date   Arthritis    back, fingers with joint pain and swelling.  chronic back pain   Cataract    Chronic back pain    arthritis    Diverticulosis    Elevated cholesterol    takes Niacin daily and Simvastatin   GERD (gastroesophageal reflux disease) 06/2004   non-specific gastritis on EGD 06/2004   Headache(784.0)    occasionally   History of colon polyps 2004, 2009   2004:adenomatous. 2009 hyperplastic.    History of hiatal hernia    Hypertension    takes Tenoretic and Lisinopril daily   Mucoid cyst of joint 08/2013   right index finger   Osteopenia 01/2014   T score -1.1 FRAX 14%/0.5%. Stable from prior DEXA   PONV (postoperative nausea and vomiting)    Rotator cuff arthropathy    Left   Seasonal allergies    Stroke ([place]) 1998   x 2 - mild left-sided weakness   Urge incontinence    Uterine prolapse     Past Surgical History:  Procedure Laterality Date   BACK SURGERY     x 2   BELPHAROPTOSIS REPAIR Bilateral 12/14/2017   BUBBLE STUDY   02/16/2021   Procedure: BUBBLE STUDY;  Surgeon: Thayer Headings, MD;  Location: [facility] ENDOSCOPY;  Service: Cardiovascular;;   CATARACT EXTRACTION     CATARACT EXTRACTION Bilateral 10/2017   COLONOSCOPY  2004, 2009, 2014   [place]     HYSTEROSCOPY WITH D & C  12/07/2010   with resection of endometrial polyp   JOINT REPLACEMENT     KNEE ARTHROSCOPY Left 2008   lip biopsy     done at MD office Fri 11/22/13   MASS EXCISION Right 08/15/2013   Procedure: RIGHT INDEX EXCISION MASS ;  Surgeon: Tennis Must, MD;  Location: [place];  Service: Orthopedics;  Laterality: Right;   NASAL SEPTUM SURGERY     OOPHORECTOMY Right 2000   REVERSE SHOULDER ARTHROPLASTY Left 12/11/2018   REVERSE SHOULDER ARTHROPLASTY Left 12/11/2018   Procedure: LEFT REVERSE SHOULDER ARTHROPLASTY;  Surgeon: Meredith Pel, MD;  Location: [place];  Service: Orthopedics;  Laterality: Left;   SHOULDER ARTHROSCOPY WITH OPEN ROTATOR CUFF REPAIR AND DISTAL CLAVICLE ACROMINECTOMY Right 11/26/2013   Procedure: RIGHT SHOULDER ARTHROSCOPY WITH MINI OPEN ROTATOR CUFF REPAIR AND DISTAL CLAVICLE RESECTION,  SUBACROMIAL DECOMPRESSION, POSSIBLE [facility] PATCH.;  Surgeon: [name], MD;  Location: [place];  Service: Orthopedics;  Laterality: Right;   TEE WITHOUT CARDIOVERSION N/A 02/16/2021   Procedure: TRANSESOPHAGEAL ECHOCARDIOGRAM (TEE);  Surgeon: Acie Fredrickson Wonda Cheng, MD;  Location: [facility] ENDOSCOPY;  Service: Cardiovascular;  Laterality: N/A;   TOTAL KNEE ARTHROPLASTY Right 03/21/2017   TOTAL KNEE ARTHROPLASTY Right 03/21/2017   Procedure: RIGHT TOTAL KNEE ARTHROPLASTY;  Surgeon: [name], MD;  Location: [place];  Service: Orthopedics;  Laterality: Right;   TUBAL LIGATION      There were no vitals filed for this visit.   Subjective Assessment - 04/27/21 1017     Subjective Pt reports increased knee pain Thursday and Friday, and that it improved on Saturday. The pt  reports she has on different tennis shoes today and thinks that might help. Pt reports L knee pain is 7-8/10 today. Pt reports L knee pain reached a 10/10 over the weekend and limited her activities.    Pertinent History [name] is a 60yoF who presents to OPPT on 04/14/21 for evaluation.   Pt evaluated by PT at [facility] on 11/7 while admitted for acute unsteadiness, Rt facial droop, imaging revealing of acute Lt CVA, remote infarcts seen bilat, pt scored 13/24 DG on 02/15/21. PT has been working with [place] here since 02/25/21.  PMH: HTN, Rt RCR, Lt TSA, bilat TKA.    Limitations House hold activities;Lifting    How long can you sit comfortably? unlimited    How long can you stand comfortably? Can stand through all tasks, but is easily fatigued thereafter    How long can you walk comfortably? A regular grocery trip    Diagnostic tests Xrays    Patient Stated Goals Being able to complete daily tasks    Currently in Pain? Yes    Pain Score 8     Pain Location Knee    Pain Orientation Left    Pain Onset More than a month ago    Pain Onset More than a month ago            INTERVENTIONS  Interventions modified this session to address LLE knee pain, as this is affecting pt's ability to perform progressive strengthening and balance interventions. PT advised pt to follow-up with her physician regarding knee pain. Pt verbalized understanding.  PT examined pt's L knee. Pt does appear to have slight area of swelling just inferior and lateral to patella. This is not present on RLE. PT informed pt of swelling. PT encouraged pt to follow-up with her physician regarding chronic L knee pain.  Pt supine on mat table: Heat donned to LLE and bolster placed under BLEs. PT checked pt's skin throughout session and after removal of heat. Pt's skin WNL with no adverse reaction observed or reported. Pt reports heat feels good to L knee.   SLR RLE 10x, 6x, 4x RLE SAQ 2x10 with 1 sec holds Pt reports fatigue with  interventions, particularly with RLE SLR  Pt exhibits slight impairment of hamstring length on LLE PT provides prolonged hamstring stretch to LLE x 3 minutes Pt performs ankle pumps of LLE (12x) while held in hamstring stretch and reports improvement in knee pain with ankle pumps  LLE knee mobilzations: Inferior/superior/medial/lateral 1-3 reps of 30 sec bouts, grade II-III. Pt reports mild pain with superior and inferior mobilizations.   PT provides TrP release and STM to L lateral distal quad and to area just inferior and lateral to patella. Pt  is TTP throughout.   PT provides supine gastroc stretch LLE 2x30 sec  Following interventions pt reports knee pain is 5/10 (reduced from 7-8/10 at start of session).  Bolster supported LLE SAQ 2x10 - pt rates as easy and reports no pain with intervention  Clamshells 2x10 BLEs  Hooklying P.ball adductor squeeze 2x10 BLEs  PT assist pt in supine piriformis stretch 2x30 sec each LE. Pt reports tightness B.  Access Code: [email or web address] URL: [email or web address] Date: 04/27/2021 Prepared by: Ricard Dillon  Exercises Clamshell - 1 x daily - 4 x weekly - 2 sets - 10 reps     Pt educated throughout session about proper posture and technique with exercises. Improved exercise technique, movement at target joints, use of target muscles after min to mod verbal, visual, tactile cues     PT Short Term Goals - 04/14/21 1637       PT SHORT TERM GOAL #1   Title Pt will be independent with HEP in order to improve strength and endurance for mobility in community.    Baseline maxed out with grocery shopping    Time 2    Period Weeks    Target Date 04/28/21               PT Long Term Goals - 04/14/21 1638       PT LONG TERM GOAL #1   Title Pt to improve FOTO survey score from 55 to >60 to represent improved perception of facility in basic movement.    Baseline FOTO 55    Time 6    Period Weeks    Status New    Target Date  05/26/21      PT LONG TERM GOAL #2   Title Pt to demonstrate improved Left SLSL >20 sec without UE support.    Baseline 04/14/21: >20sec Rt; <12sec Left.    Time 6    Period Weeks    Status New    Target Date 05/26/21      PT LONG TERM GOAL #3   Title Pt to increase 6MWT from 970f to >13012fto improve ergonomic and tolerance in community mobility for IADL.    Baseline 95032f  Time 6    Period Weeks    Status New    Target Date 05/26/21      PT LONG TERM GOAL #4   Title Pt to demonstrate ability to transition from floor to lying down to floor.    Baseline Eval: pt struggles with getting off floor due to acute on chronic DJD and weakness in 3 limbs.    Time 6    Period Weeks    Status New    Target Date 05/26/21                   Plan - 04/27/21 1408     Clinical Impression Statement PT regressed interventions to supine therEX on mat table as pt reports increased L knee pain following last session and over the weekend. PT examined pt's L knee and it appears WNL with the exception of a small pocket of swelling noted just inferior and lateral to her patella. PT informed pt of this and she reported she is aware of swelling. Pt also TTP inferior and lateral to patella. PT encouraged pt to follow-up with her physician if she continues to experience increase in L knee pain as this is limiting what she can do in PT. Pt verbalized understanding. Pt did respond well to  manual therapy to LLE and reported her pain had decreased to 5/10 following TrP, STM and mobilizations with heat applied to area. Pt also reported no pain with strenghtening interventions. The pt will benefit from further skilled PT to improve endurance, strength and balance to increase QOL and decrease fall risk.    Personal Factors and Comorbidities Age;Behavior Pattern;Fitness;Past/Current Experience    Examination-Activity Limitations Locomotion Level;Carry;Stairs;Stand    Examination-Participation Restrictions  Driving    Stability/Clinical Decision Making Stable/Uncomplicated    Rehab Potential Good    PT Frequency 2x / week    PT Duration 6 weeks    PT Treatment/Interventions ADLs/Self Care Home Management;Electrical Stimulation;Therapeutic activities;Passive range of motion;Manual techniques;Patient/family education;Therapeutic exercise;Iontophoresis 75m/ml Dexamethasone;Moist Heat;Traction;Cryotherapy;Ultrasound;Functional mobility training;Neuromuscular re-education;Balance training;Dry needling;Joint Manipulations;Stair training;Gait training;DME Instruction    PT Next Visit Plan progress endurance with overground AMB, strenghtening, higher level balance, supine therex and STM/mobs to pt tolerance    PT Home Exercise Plan ?Access Code: [email or web address] no updates - check in with pt for exercise response to determine progression or modification of interventions; 1/17: Access Code: [email or web address]    Consulted and Agree with Plan of Care Patient             Patient will benefit from skilled therapeutic intervention in order to improve the following deficits and impairments:  Decreased endurance, Decreased strength, Decreased coordination, Improper body mechanics, Cardiopulmonary status limiting activity, Decreased activity tolerance, Decreased balance, Decreased mobility, Postural dysfunction  Visit Diagnosis: Muscle weakness (generalized)  Other abnormalities of gait and mobility  Left knee pain, unspecified chronicity     Problem List Patient Active Problem List   Diagnosis Date Noted   ICH (intracerebral hemorrhage) ([place] 02/13/2021   Iron deficiency 10/07/2020   Aortic atherosclerosis ([place] 03/17/2020   Lung nodule seen on imaging study 02/24/2020   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   Chronic left shoulder pain 08/23/2018   Chronic diarrhea 12/07/2014   Segmental colitis with rectal bleeding ([place] 09/12/2014   Routine general medical examination at a health care facility 06/05/2014    Overweight 06/13/2011   Hyperlipidemia 02/17/2007   Essential hypertension 01/08/2007   Osteoarthritis 01/08/2007    HZollie Pee PT 04/27/2021, 2:15 PM  [place] [facility] [address] [address]Phone: [phone]  Fax:  [phone] Name: NKARALYN KADELMRN: [id number] of Birth: [birth year]

## 2021-04-29 ENCOUNTER — Ambulatory Visit: Payer: Medicare Other

## 2021-04-29 ENCOUNTER — Other Ambulatory Visit: Payer: Self-pay

## 2021-04-29 DIAGNOSIS — R2681 Unsteadiness on feet: Secondary | ICD-10-CM | POA: Diagnosis not present

## 2021-04-29 DIAGNOSIS — R278 Other lack of coordination: Secondary | ICD-10-CM

## 2021-04-29 DIAGNOSIS — M6281 Muscle weakness (generalized): Secondary | ICD-10-CM | POA: Diagnosis not present

## 2021-04-29 DIAGNOSIS — M25562 Pain in left knee: Secondary | ICD-10-CM

## 2021-04-29 DIAGNOSIS — R2689 Other abnormalities of gait and mobility: Secondary | ICD-10-CM | POA: Diagnosis not present

## 2021-04-29 DIAGNOSIS — M25612 Stiffness of left shoulder, not elsewhere classified: Secondary | ICD-10-CM | POA: Diagnosis not present

## 2021-04-29 NOTE — Therapy (Signed)
[place] MAIN [facility] SERVICES [address] [place], [place], [address] Phone: [phone]   Fax:  [phone]  Physical Therapy Treatment  Patient Details  Name: [name] MRN: [id number] Date of Birth: March 02, 1951 Referring Provider (PT): Stacy Contras, MD   Encounter Date: 04/29/2021   PT End of Session - 04/29/21 1620     Visit Number 6    Number of Visits 13    Date for PT Re-Evaluation 05/26/21   Pt agreeable to 6-week period   Authorization Type Medicare primary, Aetna secondary    Authorization Time Period 04/14/21-05/26/21    Progress Note Due on Visit 10    PT Start Time 1433    PT Stop Time 1513    PT Time Calculation (min) 40 min    Equipment Utilized During Treatment Gait belt    Activity Tolerance Patient tolerated treatment well;Patient limited by pain    Behavior During Therapy WFL for tasks assessed/performed             Past Medical History:  Diagnosis Date   Arthritis    back, fingers with joint pain and swelling.  chronic back pain   Cataract    Chronic back pain    arthritis    Diverticulosis    Elevated cholesterol    takes Niacin daily and Simvastatin   GERD (gastroesophageal reflux disease) 06/2004   non-specific gastritis on EGD 06/2004   Headache(784.0)    occasionally   History of colon polyps 2004, 2009   2004:adenomatous. 2009 hyperplastic.    History of hiatal hernia    Hypertension    takes Tenoretic and Lisinopril daily   Mucoid cyst of joint 08/2013   right index finger   Osteopenia 01/2014   T score -1.1 FRAX 14%/0.5%. Stable from prior DEXA   PONV (postoperative nausea and vomiting)    Rotator cuff arthropathy    Left   Seasonal allergies    Stroke ([place]) 1998   x 2 - mild left-sided weakness   Urge incontinence    Uterine prolapse     Past Surgical History:  Procedure Laterality Date   BACK SURGERY     x 2   BELPHAROPTOSIS REPAIR Bilateral 12/14/2017   BUBBLE STUDY   02/16/2021   Procedure: BUBBLE STUDY;  Surgeon: Stacy Headings, MD;  Location: [facility] ENDOSCOPY;  Service: Cardiovascular;;   CATARACT EXTRACTION     CATARACT EXTRACTION Bilateral 10/2017   COLONOSCOPY  2004, 2009, 2014   [place]     HYSTEROSCOPY WITH D & C  12/07/2010   with resection of endometrial polyp   JOINT REPLACEMENT     KNEE ARTHROSCOPY Left 2008   lip biopsy     done at MD office Fri 11/22/13   MASS EXCISION Right 08/15/2013   Procedure: RIGHT INDEX EXCISION MASS ;  Surgeon: Stacy Must, MD;  Location: [place];  Service: Orthopedics;  Laterality: Right;   NASAL SEPTUM SURGERY     OOPHORECTOMY Right 2000   REVERSE SHOULDER ARTHROPLASTY Left 12/11/2018   REVERSE SHOULDER ARTHROPLASTY Left 12/11/2018   Procedure: LEFT REVERSE SHOULDER ARTHROPLASTY;  Surgeon: Stacy Pel, MD;  Location: [place];  Service: Orthopedics;  Laterality: Left;   SHOULDER ARTHROSCOPY WITH OPEN ROTATOR CUFF REPAIR AND DISTAL CLAVICLE ACROMINECTOMY Right 11/26/2013   Procedure: RIGHT SHOULDER ARTHROSCOPY WITH MINI OPEN ROTATOR CUFF REPAIR AND DISTAL CLAVICLE RESECTION,  SUBACROMIAL DECOMPRESSION, POSSIBLE [facility] PATCH.;  Surgeon: Stacy Balding, MD;  Location: [place];  Service: Orthopedics;  Laterality: Right;   TEE WITHOUT CARDIOVERSION N/A 02/16/2021   Procedure: TRANSESOPHAGEAL ECHOCARDIOGRAM (TEE);  Surgeon: Stacy Fredrickson Wonda Cheng, MD;  Location: [facility] ENDOSCOPY;  Service: Cardiovascular;  Laterality: N/A;   TOTAL KNEE ARTHROPLASTY Right 03/21/2017   TOTAL KNEE ARTHROPLASTY Right 03/21/2017   Procedure: RIGHT TOTAL KNEE ARTHROPLASTY;  Surgeon: Stacy Balding, MD;  Location: [place];  Service: Orthopedics;  Laterality: Right;   TUBAL LIGATION      There were no vitals filed for this visit.   Subjective Assessment - 04/29/21 1439     Subjective Pt reports 4-5/10 L knee pain. She reports she did OK after last appointment.    Pertinent  History [name] is a 49yoF who presents to OPPT on 04/14/21 for evaluation.   Pt [facility] while admitted for acute unsteadiness, Rt facial droop, imaging revealing of acute Lt CVA, remote infarcts seen bilat, pt scored 13/24 DG on 02/15/21. PT has been working with [name] here since 02/25/21.  PMH: HTN, Rt RCR, Lt TSA, bilat TKA.    Limitations House hold activities;Lifting    How long can you sit comfortably? unlimited    How long can you stand comfortably? Can stand through all tasks, but is easily fatigued thereafter    How long can you walk comfortably? A regular grocery trip    Diagnostic tests Xrays    Patient Stated Goals Being able to complete daily tasks    Currently in Pain? Yes    Pain Location Leg    Pain Orientation Left    Pain Onset More than a month ago    Pain Onset More than a month ago             INTERVENTIONS  Pt supine on mat table: Heat donned to LLE and bolster placed under BLEs.  Pt reports heat feels good to L knee. No adverse reaction to treatment observed or reported.    LLE knee mobilzations: Inferior/superior/medial/lateral 4 reps of 30 sec bouts, grade II-III. Pt reports mild pain with meidal to lat. Mobilization  SLR RLE 2x12 RLE SAQ 3x15 with 1 sec holds; pt rates as a "low medium"  Pt continues to report fatigue with intervention  LLE SAQ 2x8; pt rates as medium. Pt reports no pain with intervention.  LLE SLR 8x, 6x: pt rates as hard with first set and "not quite as bad" with second set  Pball supine hamstring curl 2x10  Supine PT assisted hamstring stretch 2x30 sec each LE  Bolster supported glute bridge 8x, 6x; pt repots no pain with intervention. Pt rates intervention as medium  PT assist pt in supine piriformis stretch 2x30 sec each LE. Pt reports "it's pretty tight" B.   Adductor squeezes with red p.ball 2x10 with 3 sec holds; pt rates as easy+  Clamshells 2x10 BLEs; pt rates as more difficulty on RLE   Seated  RTB PF 10x; pt rates as easy   L knee pain at end of session rated as 3/10, pt reports improvement is in part because she is not currently weightbearing.       Pt educated throughout session about proper posture and technique with exercises. Improved exercise technique, movement at target joints, use of target muscles after min to mod verbal, visual, tactile cues     PT Education - 04/29/21 1619     Education Details  exercise technique, body mechanics    Person(s) Educated Patient    Methods Explanation;Demonstration;Tactile cues;Verbal cues    Comprehension Verbalized understanding;Returned demonstration;Need further instruction              PT Short Term Goals - 04/14/21 1637       PT SHORT TERM GOAL #1   Title Pt will be independent with HEP in order to improve strength and endurance for mobility in community.    Baseline maxed out with grocery shopping    Time 2    Period Weeks    Target Date 04/28/21               PT Long Term Goals - 04/14/21 1638       PT LONG TERM GOAL #1   Title Pt to improve FOTO survey score from 55 to >60 to represent improved perception of facility in basic movement.    Baseline FOTO 55    Time 6    Period Weeks    Status New    Target Date 05/26/21      PT LONG TERM GOAL #2   Title Pt to demonstrate improved Left SLSL >20 sec without UE support.    Baseline 04/14/21: >20sec Rt; <12sec Left.    Time 6    Period Weeks    Status New    Target Date 05/26/21      PT LONG TERM GOAL #3   Title Pt to increase 6MWT from 9107f to >13045fto improve ergonomic and tolerance in community mobility for IADL.    Baseline 95056f  Time 6    Period Weeks    Status New    Target Date 05/26/21      PT LONG TERM GOAL #4   Title Pt to demonstrate ability to transition from floor to lying down to floor.    Baseline Eval: pt struggles with getting off floor due to acute on chronic DJD and weakness in 3 limbs.    Time 6    Period Weeks     Status New    Target Date 05/26/21                   Plan - 04/29/21 1622     Clinical Impression Statement Pt continues supine strengthening interventions on this date as she reported she was ok following last session. Pt reported no pain with all strengthening interventions. Pt reported difficulty ranging from easy-medium. The pt will benefit from further skilled PT to increase strength, endurance, and balance to increase QOL and decrease fall risk.    Personal Factors and Comorbidities Age;Behavior Pattern;Fitness;Past/Current Experience    Examination-Activity Limitations Locomotion Level;Carry;Stairs;Stand    Examination-Participation Restrictions Driving    Stability/Clinical Decision Making Stable/Uncomplicated    Rehab Potential Good    PT Frequency 2x / week    PT Duration 6 weeks    PT Treatment/Interventions ADLs/Self Care Home Management;Electrical Stimulation;Therapeutic activities;Passive range of motion;Manual techniques;Patient/family education;Therapeutic exercise;Iontophoresis 4mg41m Dexamethasone;Moist Heat;Traction;Cryotherapy;Ultrasound;Functional mobility training;Neuromuscular re-education;Balance training;Dry needling;Joint Manipulations;Stair training;Gait training;DME Instruction    PT Next Visit Plan progress endurance with overground AMB, strenghtening, higher level balance, supine therex and STM/mobs to pt tolerance    PT Home Exercise Plan ?Access Code: [email or web address] updates - check in with pt for exercise response to determine progression or modification of interventions; 1/17: Access Code: [email or web address]    Consulted and Agree with Plan of Care Patient  Patient will benefit from skilled therapeutic intervention in order to improve the following deficits and impairments:  Decreased endurance, Decreased strength, Decreased coordination, Improper body mechanics, Cardiopulmonary status limiting activity, Decreased activity tolerance, Decreased  balance, Decreased mobility, Postural dysfunction  Visit Diagnosis: Left knee pain, unspecified chronicity  Muscle weakness (generalized)     Problem List Patient Active Problem List   Diagnosis Date Noted   ICH (intracerebral hemorrhage) ([place]) 02/13/2021   Iron deficiency 10/07/2020   Aortic atherosclerosis ([place]) 03/17/2020   Lung nodule seen on imaging study 02/24/2020   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   Chronic left shoulder pain 08/23/2018   Chronic diarrhea 12/07/2014   Segmental colitis with rectal bleeding ([place]) 09/12/2014   Routine general medical examination at a health care facility 06/05/2014   Overweight 06/13/2011   Hyperlipidemia 02/17/2007   Essential hypertension 01/08/2007   Osteoarthritis 01/08/2007    Zollie Pee, PT 04/29/2021, 4:27 PM  [place] MAIN [place] [address] [place], [place], [address] Phone: [phone]   Fax:  [phone]  Name: [name] MRN: [id number] Date of Birth: Aug 11, 1950

## 2021-04-30 NOTE — Therapy (Signed)
[place] MA[facility] SERVICES [address] [place], [place], [address] Phone: [phone]   Fax:  [phone]  Occupational Therapy Treatment  Patient Details  Name: [name] MRN: [id number] Date of Birth: 05-27-1950 Referring Provider (OT): Dr. Antony Contras   Encounter Date: 04/29/2021   OT End of Session - 04/30/21 1005     Visit Number 17    Number of Visits 21    Date for OT Re-Evaluation 05/08/21    Authorization Time Period Reporting period beginning 04/01/21    OT Start Time 1349    OT Stop Time 1432    OT Time Calculation (min) 43 min    Activity Tolerance Patient tolerated treatment well    Behavior During Therapy [facility] for tasks assessed/performed             Past Medical History:  Diagnosis Date   Arthritis    back, fingers with joint pain and swelling.  chronic back pain   Cataract    Chronic back pain    arthritis    Diverticulosis    Elevated cholesterol    takes Niacin daily and Simvastatin   GERD (gastroesophageal reflux disease) 06/2004   non-specific gastritis on EGD 06/2004   Headache(784.0)    occasionally   History of colon polyps 2004, 2009   2004:adenomatous. 2009 hyperplastic.    History of hiatal hernia    Hypertension    takes Tenoretic and Lisinopril daily   Mucoid cyst of joint 08/2013   right index finger   Osteopenia 01/2014   T score -1.1 FRAX 14%/0.5%. Stable from prior DEXA   PONV (postoperative nausea and vomiting)    Rotator cuff arthropathy    Left   Seasonal allergies    Stroke ([place]) 1998   x 2 - mild left-sided weakness   Urge incontinence    Uterine prolapse     Past Surgical History:  Procedure Laterality Date   BACK SURGERY     x 2   BELPHAROPTOSIS REPAIR Bilateral 12/14/2017   BUBBLE STUDY  02/16/2021   Procedure: BUBBLE STUDY;  Surgeon: Thayer Headings, MD;  Location: [facility] ENDOSCOPY;  Service: Cardiovascular;;   CATARACT EXTRACTION     CATARACT EXTRACTION  Bilateral 10/2017   COLONOSCOPY  2004, 2009, 2014   [place]     HYSTEROSCOPY WITH D & C  12/07/2010   with resection of endometrial polyp   JOINT REPLACEMENT     KNEE ARTHROSCOPY Left 2008   lip biopsy     done at MD office Fri 11/22/13   MASS EXCISION Right 08/15/2013   Procedure: RIGHT INDEX EXCISION MASS ;  Surgeon: Tennis Must, MD;  Location: [place];  Service: Orthopedics;  Laterality: Right;   NASAL SEPTUM SURGERY     OOPHORECTOMY Right 2000   REVERSE SHOULDER ARTHROPLASTY Left 12/11/2018   REVERSE SHOULDER ARTHROPLASTY Left 12/11/2018   Procedure: LEFT REVERSE SHOULDER ARTHROPLASTY;  Surgeon: Meredith Pel, MD;  Location: [place];  Service: Orthopedics;  Laterality: Left;   SHOULDER ARTHROSCOPY WITH OPEN ROTATOR CUFF REPAIR AND DISTAL CLAVICLE ACROMINECTOMY Right 11/26/2013   Procedure: RIGHT SHOULDER ARTHROSCOPY WITH MINI OPEN ROTATOR CUFF REPAIR AND DISTAL CLAVICLE RESECTION, SUBACROMIAL DECOMPRESSION, POSSIBLE [facility] PATCH.;  Surgeon: [name], MD;  Location: [place];  Service: Orthopedics;  Laterality: Right;   TEE WITHOUT CARDIOVERSION N/A 02/16/2021   Procedure: TRANSESOPHAGEAL ECHOCARDIOGRAM (  TEE);  Surgeon: Acie Fredrickson Wonda Cheng, MD;  Location: [facility] ENDOSCOPY;  Service: Cardiovascular;  Laterality: N/A;   TOTAL KNEE ARTHROPLASTY Right 03/21/2017   TOTAL KNEE ARTHROPLASTY Right 03/21/2017   Procedure: RIGHT TOTAL KNEE ARTHROPLASTY;  Surgeon: [name], MD;  Location: [place];  Service: Orthopedics;  Laterality: Right;   TUBAL LIGATION      There were no vitals filed for this visit.   Subjective Assessment - 04/29/21 1002     Subjective  Pt reports feeling improvement in ability to reach up into overhead kitchen cupboards.    Pertinent History Recent CVA on 02/13/21 affecting R side, hx of CVA x2 in 1998 affecting L side, hx of R TKA, L TSA, R rotator cuff repair    Limitations weakness,  fatigue, impaired [facility]    Patient Stated Goals "Improve my strength, endurance, and coordination."    Pain Score 8     Pain Location Leg    Pain Orientation Left    Pain Descriptors / Indicators Sharp;Aching    Pain Type Chronic pain    Pain Radiating Towards knee    Pain Onset More than a month ago    Pain Frequency Constant    Aggravating Factors  standing, walking    Pain Relieving Factors rest, meds, heat    Effect of Pain on Daily Activities pain with mobility    Multiple Pain Sites Yes    Pain Score 3    Pain Location Shoulder    Pain Orientation Right;Left    Pain Descriptors / Indicators Aching    Pain Type Chronic pain    Pain Onset More than a month ago    Pain Frequency Intermittent    Aggravating Factors  raising arms above shoulder level    Pain Relieving Factors rest, heat, pain medications    Effect of Pain on Daily Activities pain with reaching overhead            Occupational Therapy Treatment: Therapeutic Exercise: Facilitated hand strengthening with use of hand gripper set at min resistance with 1 red band to perform grip squeezes for R/L hands x5 sets 10 reps each.  Pt picked up 1/2"-1" washers from dish and placed over vertical dowels to target small item pick up and reaching toward a target for forward and lateral reaching with LUE; good accuracy.  Issued yellow and red therabands and instructed in BUE strengthening (used yellow band only and pt may progress to red as able) for bilat shoulder horiz abd, shoulder flex, shoulder abd, ER, chest press, and bicep/tricep strengthening completing 1 set 10 reps with rest breaks between exercise.  OT provided handout from [place] of all theraband exercises noted above.    Response to Treatment: See Plan/clinical impression below.    OT Education - 04/29/21 1004     Education Details HEP progression/theraband exercises for BUEs    Person(s) Educated Patient    Methods Explanation;Verbal  cues;Demonstration;Tactile cues;Handout    Comprehension Verbalized understanding;Returned demonstration;Verbal cues required;Need further instruction              OT Short Term Goals - 03/30/21 0810       OT SHORT TERM GOAL #1   Title Pt will perform BUE HEP independently for increasing strenth and coordination for self care tasks.    Baseline Eval: initiated theraputty exercises at eval; pt is indep with theraputty/additions with shoulder exercises requires vc    Time 3    Period Weeks    Status On-going  Target Date 04/21/21               OT Long Term Goals - 03/30/21 1158       OT LONG TERM GOAL #1   Title Pt will improve FOTO score to 80 or better to indicate increased functional performance.    Baseline Eval: FOTO 72 (questions answered related to RUE); 03/30/21 FOTO 54 (questions answered related to LUE)    Time 5    Period Weeks    Status On-going    Target Date 05/08/21      OT LONG TERM GOAL #2   Title Pt will improve bilat hand coordination to ease manipulation and efficiency with managing clothing fasteners.    Baseline Eval: extra time and effort to manage small buttons (R 9 hole peg test 30 sec, L 38 sec); 03/30/21: R 9 hole 27 sec, L 29, improving with clothing fasteners (extra time)    Time 5    Period Weeks    Status On-going    Target Date 05/08/21      OT LONG TERM GOAL #3   Title Pt will increase bilat hand strength by 5 or more lbs for improving grasp and manipulation of ADL supplies.    Baseline Eval: R grip 29 lbs, L grip 24 lbs; 03/2021: R grip 35 lbs, L grip 31 lbs    Time 5    Period Weeks    Status On-going    Target Date 05/08/21      OT LONG TERM GOAL #4   Title Pt will improve GMC bilaterally for improved accuracy and efficiency when reaching for ADL supplies.    Baseline Eval: mild BUE ataxia; Good accuracy with RUE reaching, mild ataxia LUE and difficulty reaching above shoulder level for ADL supplies in either arm    Time 5     Period Weeks    Status On-going    Target Date 05/08/21      OT LONG TERM GOAL #5   Title Pt will increase activity tolerance for IADLs and community mobility indicated by RPE of 2 or better with these activities.    Baseline Eval: RPE of 5 with IADLs and community mobility; 03/30/2021: RPE 4    Time 5    Period Weeks    Status On-going    Target Date 05/08/21      Long Term Additional Goals   Additional Long Term Goals Yes      OT LONG TERM GOAL #6   Title Pt will increase bilat shoulder flex/abd strength to ease over head reach for ADL supplies.    Baseline 03/30/21: R/L shoulder flex/abd 4-/5    Time 5    Period Weeks    Status New    Target Date 05/08/21              Plan - 04/29/21 1121     Clinical Impression Statement Discussed with pt her readiness to discharge from OT end of next week.  OT anticipates all goals will be met and pt to be indep with HEP at that time.  Pt in agreement with plan and feels she will be ready.  OT Issued yellow and red therabands and instructed in BUE strengthening (used yellow band only and pt may progress to red as able) for bilat shoulder horiz abd, shoulder flex, shoulder abd, ER, chest press, and bicep/tricep strengthening completing 1 set 10 reps with rest breaks between exercise.  OT provided handout from [place] of all  theraband exercises noted above.  Remaining visits will continue to focus on BUE strengthening, LUE coordination, and ensuring indep with new exercises learned this day.    OT Occupational Profile and History Detailed Assessment- Review of Records and additional review of physical, cognitive, psychosocial history related to current functional performance    Occupational performance deficits (Please refer to evaluation for details): ADL's;IADL's    Body Structure / Function / Physical Skills ADL;Dexterity;Strength;Coordination;FMC;Body mechanics;Endurance;UE functional use;GMC    Rehab Potential Excellent    Clinical  Decision Making Several treatment options, min-mod task modification necessary    Comorbidities Affecting Occupational Performance: May have comorbidities impacting occupational performance    Modification or Assistance to Complete Evaluation  No modification of tasks or assist necessary to complete eval    OT Frequency 2x / week    OT Duration 6 weeks    OT Treatment/Interventions Self-care/ADL training;Therapeutic exercise;DME and/or AE instruction;Neuromuscular education;Manual Therapy;Moist Heat;Energy conservation;Therapeutic activities;Patient/family education;Passive range of motion    Plan OT to address strength and coordination deficits bilaterally which impact self care performance    OT Home Exercise Plan issued pink theraputty at eval and instructed in exercises/handout issued    Recommended Other Services N/A    Consulted and Agree with Plan of Care Patient             Patient will benefit from skilled therapeutic intervention in order to improve the following deficits and impairments:   Body Structure / Function / Physical Skills: ADL, Dexterity, Strength, Coordination, FMC, Body mechanics, Endurance, UE functional use, GMC       Visit Diagnosis: Muscle weakness (generalized)  Other lack of coordination    Problem List Patient Active Problem List   Diagnosis Date Noted   ICH (intracerebral hemorrhage) ([place]) 02/13/2021   Iron deficiency 10/07/2020   Aortic atherosclerosis ([place]) 03/17/2020   Lung nodule seen on imaging study 02/24/2020   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   Chronic left shoulder pain 08/23/2018   Chronic diarrhea 12/07/2014   Segmental colitis with rectal bleeding ([place]) 09/12/2014   Routine general medical examination at a health care facility 06/05/2014   Overweight 06/13/2011   Hyperlipidemia 02/17/2007   Essential hypertension 01/08/2007   Osteoarthritis 01/08/2007   Leta Speller, MS, OTR/L  Darleene Cleaver, OT 04/30/2021,  11:21 AM  [facility] [address] [place], [place], [address] Phone: [phone]   Fax:  [phone]  Name: [name] MRN: [id number] Date of Birth: 08/12/1950

## 2021-05-04 ENCOUNTER — Ambulatory Visit: Payer: Medicare Other

## 2021-05-05 ENCOUNTER — Ambulatory Visit: Payer: Medicare Other

## 2021-05-06 ENCOUNTER — Ambulatory Visit: Payer: Medicare Other | Admitting: Orthopaedic Surgery

## 2021-05-06 ENCOUNTER — Ambulatory Visit: Payer: Medicare Other

## 2021-05-06 ENCOUNTER — Other Ambulatory Visit: Payer: Self-pay

## 2021-05-06 DIAGNOSIS — M25612 Stiffness of left shoulder, not elsewhere classified: Secondary | ICD-10-CM

## 2021-05-06 DIAGNOSIS — M6281 Muscle weakness (generalized): Secondary | ICD-10-CM

## 2021-05-06 DIAGNOSIS — R278 Other lack of coordination: Secondary | ICD-10-CM

## 2021-05-06 DIAGNOSIS — R2689 Other abnormalities of gait and mobility: Secondary | ICD-10-CM | POA: Diagnosis not present

## 2021-05-06 DIAGNOSIS — R2681 Unsteadiness on feet: Secondary | ICD-10-CM | POA: Diagnosis not present

## 2021-05-06 DIAGNOSIS — M25562 Pain in left knee: Secondary | ICD-10-CM | POA: Diagnosis not present

## 2021-05-06 NOTE — Therapy (Signed)
[place] MAIN [facility] SERVICES [address] [place], [place], [address] Phone: [phone]   Fax:  [phone]  Occupational Therapy Treatment  Patient Details  Name: [name] MRN: [id number] Date of Birth: July 03, 1950 Referring Provider (OT): Dr. Antony Contras   Encounter Date: 05/06/2021   OT End of Session - 05/06/21 1157     Visit Number 18    Number of Visits 21    Date for OT Re-Evaluation 05/08/21    Authorization Time Period Reporting period beginning 04/01/21    OT Start Time 1100    OT Stop Time 1140    OT Time Calculation (min) 40 min    Activity Tolerance Patient tolerated treatment well    Behavior During Therapy [facility] for tasks assessed/performed             Past Medical History:  Diagnosis Date   Arthritis    back, fingers with joint pain and swelling.  chronic back pain   Cataract    Chronic back pain    arthritis    Diverticulosis    Elevated cholesterol    takes Niacin daily and Simvastatin   GERD (gastroesophageal reflux disease) 06/2004   non-specific gastritis on EGD 06/2004   Headache(784.0)    occasionally   History of colon polyps 2004, 2009   2004:adenomatous. 2009 hyperplastic.    History of hiatal hernia    Hypertension    takes Tenoretic and Lisinopril daily   Mucoid cyst of joint 08/2013   right index finger   Osteopenia 01/2014   T score -1.1 FRAX 14%/0.5%. Stable from prior DEXA   PONV (postoperative nausea and vomiting)    Rotator cuff arthropathy    Left   Seasonal allergies    Stroke ([place]) 1998   x 2 - mild left-sided weakness   Urge incontinence    Uterine prolapse     Past Surgical History:  Procedure Laterality Date   BACK SURGERY     x 2   BELPHAROPTOSIS REPAIR Bilateral 12/14/2017   BUBBLE STUDY  02/16/2021   Procedure: BUBBLE STUDY;  Surgeon: Thayer Headings, MD;  Location: [facility] ENDOSCOPY;  Service: Cardiovascular;;   CATARACT EXTRACTION     CATARACT EXTRACTION  Bilateral 10/2017   COLONOSCOPY  2004, 2009, 2014   [place]     HYSTEROSCOPY WITH D & C  12/07/2010   with resection of endometrial polyp   JOINT REPLACEMENT     KNEE ARTHROSCOPY Left 2008   lip biopsy     done at MD office Fri 11/22/13   MASS EXCISION Right 08/15/2013   Procedure: RIGHT INDEX EXCISION MASS ;  Surgeon: Tennis Must, MD;  Location: [place];  Service: Orthopedics;  Laterality: Right;   NASAL SEPTUM SURGERY     OOPHORECTOMY Right 2000   REVERSE SHOULDER ARTHROPLASTY Left 12/11/2018   REVERSE SHOULDER ARTHROPLASTY Left 12/11/2018   Procedure: LEFT REVERSE SHOULDER ARTHROPLASTY;  Surgeon: Meredith Pel, MD;  Location: [place];  Service: Orthopedics;  Laterality: Left;   SHOULDER ARTHROSCOPY WITH OPEN ROTATOR CUFF REPAIR AND DISTAL CLAVICLE ACROMINECTOMY Right 11/26/2013   Procedure: RIGHT SHOULDER ARTHROSCOPY WITH MINI OPEN ROTATOR CUFF REPAIR AND DISTAL CLAVICLE RESECTION, SUBACROMIAL DECOMPRESSION, POSSIBLE [employer] [facility] PATCH.;  Surgeon: [name], MD;  Location: [place];  Service: Orthopedics;  Laterality: Right;   TEE WITHOUT CARDIOVERSION N/A 02/16/2021   Procedure: TRANSESOPHAGEAL ECHOCARDIOGRAM (  TEE);  Surgeon: Acie Fredrickson Wonda Cheng, MD;  Location: [facility] ENDOSCOPY;  Service: Cardiovascular;  Laterality: N/A;   TOTAL KNEE ARTHROPLASTY Right 03/21/2017   TOTAL KNEE ARTHROPLASTY Right 03/21/2017   Procedure: RIGHT TOTAL KNEE ARTHROPLASTY;  Surgeon: [name], MD;  Location: [place];  Service: Orthopedics;  Laterality: Right;   TUBAL LIGATION      There were no vitals filed for this visit.   Subjective Assessment - 05/06/21 1151     Subjective  Pt reports doing better today.  L knee has been really sore to the point of limited walking the other day but better today.    Pertinent History Recent CVA on 02/13/21 affecting R side, hx of CVA x2 in 1998 affecting L side, hx of R TKA, L TSA, R rotator cuff  repair    Limitations weakness, fatigue, impaired [facility]    Patient Stated Goals "Improve my strength, endurance, and coordination."    Currently in Pain? Yes    Pain Score 5     Pain Location Knee    Pain Orientation Left    Pain Descriptors / Indicators Aching;Sharp    Pain Type Chronic pain    Pain Radiating Towards knee    Pain Onset More than a month ago    Pain Frequency Constant    Aggravating Factors  standing, walking    Pain Relieving Factors rest, meds, heat    Effect of Pain on Daily Activities pain with mobility    Multiple Pain Sites Yes    Pain Score 2    Pain Location Shoulder    Pain Descriptors / Indicators Aching    Pain Type Chronic pain    Pain Radiating Towards shoulder/upper arm    Pain Onset More than a month ago    Pain Frequency Intermittent    Aggravating Factors  raising arms above shoulder level    Pain Relieving Factors rest, heat, pain medications    Effect of Pain on Daily Activities pain with reaching overhead                [facility] OT Assessment - 05/06/21 0001       Assessment   Medical Diagnosis CVA    Onset Date/Surgical Date 02/13/21    Hand Dominance Right      Coordination   Right 9 Hole Peg Test 28 sec    Left 9 Hole Peg Test 30 sec      AROM   Overall AROM Comments R shoulder active flexion 150, L 120; R shoulder active abd 130, L 115      Strength   Overall Strength Comments Bilat shoulder flex 4/5, abd 4- (hx of L TSA and R rotator cuff repair), bilat elbow flex/ext and wrist flex/ext 5/5      Hand Function   Right Hand Grip (lbs) 37    Right Hand Lateral Pinch 15 lbs    Right Hand 3 Point Pinch 12 lbs    Left Hand Grip (lbs) 31    Left Hand Lateral Pinch 11 lbs    Left 3 point pinch 10 lbs            Occupational Therapy Treatment/Discharge visit: Therapeutic Exercise: Remeasured grip/pinch/BUE ROM/strength and updated goals for d/c.  Reviewed HEP for cane stretches for shoulder mobility, theraputty frequency  and benefits, and strengthening with use of yellow theraband with carry over.  Pt is indep with HEP and understanding benefits of ongoing completion to maintain joint mobility, strength, and  coordination for daily tasks.   Response to Treatment: Pt has been seen for 18 OT visits following CVA which affected R side, though pt also had an exacerbation of L sided weakness with this recent CVA which had been weak from a CVA in 1998.  Pt also with hx of arthritis, with chronic pain in bilat shoulders and L knee.  At discharge, pt presents with improved shoulder strength and flexibility, and improved grip, pinch, and [place] bilaterally as noted above (see OT assessment for details).  Pt reports improved ability to reach into overhead cupboards, and better accuracy and stability when reaching for objects in either hand.  Pt is indep with HEP and all OT goals sufficiently met.  No further skilled OT indicated at this time.  Pt in agreement with readiness for discharge.     OT Education - 05/06/21 1157     Education Details HEP review    Person(s) Educated Patient    Methods Explanation;Demonstration;Handout    Comprehension Verbalized understanding;Returned demonstration              OT Short Term Goals - 05/06/21 1158       OT SHORT TERM GOAL #1   Title Pt will perform BUE HEP independently for increasing strenth and coordination for self care tasks.    Baseline Eval: initiated theraputty exercises at eval; pt is indep with theraputty/additions with shoulder exercises requires vc; 05/06/20 d/c: Pt is indep with theraputty, theraband for BUE strengthening, and cane exercises for bilat shoulder flexibility    Time 3    Period Weeks    Status Achieved    Target Date 04/21/21               OT Long Term Goals - 05/06/21 1200       OT LONG TERM GOAL #1   Title Pt will improve FOTO score to 80 or better to indicate increased functional performance.    Baseline Eval: FOTO 72 (questions  answered related to RUE); 03/30/21 FOTO 54 (questions answered related to LUE); 05/06/20 d/c: FOTO for RUE 71, FOTO for LUE 61 (RUE no significant change, LUE improved from 54 to 61)    Time 5    Period Weeks    Status Partially Met    Target Date 05/08/21      OT LONG TERM GOAL #2   Title Pt will improve bilat hand coordination to ease manipulation and efficiency with managing clothing fasteners.    Baseline Eval: extra time and effort to manage small buttons (R 9 hole peg test 30 sec, L 38 sec); 03/30/21: R 9 hole 27 sec, L 29, improving with clothing fasteners (extra time); 05/06/21 d/c: (R 9 hole 28 sec, L 30 sec), improved with clothing fasteners, still slightly limited by arthritis    Time 5    Period Weeks    Status Achieved    Target Date 05/08/21      OT LONG TERM GOAL #3   Title Pt will increase bilat hand strength by 5 or more lbs for improving grasp and manipulation of ADL supplies.    Baseline Eval: R grip 29 lbs, L grip 24 lbs; 03/2021: R grip 35 lbs, L grip 31 lbs; 05/06/21 d/c: R grip 37 lbs, L 31 lbs    Time 5    Period Weeks    Status Achieved    Target Date 05/08/21      OT LONG TERM GOAL #4   Title Pt will improve [place]  bilaterally for improved accuracy and efficiency when reaching for ADL supplies.    Baseline Eval: mild BUE ataxia; Good accuracy with RUE reaching, mild ataxia LUE and difficulty reaching above shoulder level for ADL supplies in either arm; 05/06/21 d/c: Good accuracy with BUE reaching, very mild shaking when fatigued.    Time 5    Period Weeks    Status Achieved    Target Date 05/08/21      OT LONG TERM GOAL #5   Title Pt will increase activity tolerance for IADLs and community mobility indicated by RPE of 2 or better with these activities.    Baseline Eval: RPE of 5 with IADLs and community mobility; 03/30/2021: RPE 4; 05/06/21 d/c: RPE 3-4, but wide spread arthritic pain causes fatigue and limited tolerance for community mobility.    Time 5    Period  Weeks    Status Partially Met    Target Date 05/08/21      OT LONG TERM GOAL #6   Title Pt will increase bilat shoulder flex/abd strength to ease over head reach for ADL supplies.    Baseline 03/30/21: R/L shoulder flex/abd 4-/5; 05/06/21 d/c: bilat shoulder flex 4/5, abd still 4-/5 but active abduction has improved, R abd improved by 25 degrees (130), and L abd improved by 15 degrees (115).    Time 5    Period Weeks    Status Partially Met    Target Date 05/08/21              Plan - 05/06/21 1257     Clinical Impression Statement Pt has been seen for 78 OT visits following CVA which affected R side, though pt also had an exacerbation of L sided weakness with this recent CVA which had been weak from a CVA in 1998.  Pt also with hx of arthritis, with chronic pain in bilat shoulders and L knee.  At discharge, pt presents with improved shoulder strength and flexibility, and improved grip, pinch, and [place] bilaterally as noted above (see OT assessment for details).  ?Pt reports improved ability to reach into overhead cupboards, and better accuracy and stability when reaching for objects in either hand.  Pt is indep with HEP and all OT goals sufficiently met.  No further skilled OT indicated at this time.  Pt in agreement with readiness for discharge.    OT Occupational Profile and History Detailed Assessment- Review of Records and additional review of physical, cognitive, psychosocial history related to current functional performance    Occupational performance deficits (Please refer to evaluation for details): ADL's;IADL's    Body Structure / Function / Physical Skills ADL;Dexterity;Strength;Coordination;FMC;Body mechanics;Endurance;UE functional use;GMC    Rehab Potential Excellent    Clinical Decision Making Several treatment options, min-mod task modification necessary    Comorbidities Affecting Occupational Performance: May have comorbidities impacting occupational performance     Modification or Assistance to Complete Evaluation  No modification of tasks or assist necessary to complete eval    OT Frequency 2x / week    OT Duration 6 weeks    OT Treatment/Interventions Self-care/ADL training;Therapeutic exercise;DME and/or AE instruction;Neuromuscular education;Manual Therapy;Moist Heat;Energy conservation;Therapeutic activities;Patient/family education;Passive range of motion    Plan OT to address strength and coordination deficits bilaterally which impact self care performance    OT Home Exercise Plan issued pink theraputty at eval and instructed in exercises/handout issued    Recommended Other Services N/A    Consulted and Agree with Plan of Care Patient  Patient will benefit from skilled therapeutic intervention in order to improve the following deficits and impairments:   Body Structure / Function / Physical Skills: ADL, Dexterity, Strength, Coordination, FMC, Body mechanics, Endurance, UE functional use, GMC       Visit Diagnosis: Muscle weakness (generalized)  Other lack of coordination  Stiffness of left shoulder, not elsewhere classified    Problem List Patient Active Problem List   Diagnosis Date Noted   ICH (intracerebral hemorrhage) ([place]) 02/13/2021   Iron deficiency 10/07/2020   Aortic atherosclerosis ([place]) 03/17/2020   Lung nodule seen on imaging study 02/24/2020   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   Chronic left shoulder pain 08/23/2018   Chronic diarrhea 12/07/2014   Segmental colitis with rectal bleeding ([place]) 09/12/2014   Routine general medical examination at a health care facility 06/05/2014   Overweight 06/13/2011   Hyperlipidemia 02/17/2007   Essential hypertension 01/08/2007   Osteoarthritis 01/08/2007   Leta Speller, MS, OTR/L  Darleene Cleaver, OT 05/06/2021, 12:58 PM  [place] MAIN [facility] SERVICES [address] [place], [place], [address] Phone:  [phone]   Fax:  [phone]  Name: QUEEN ABBETT MRN: [id number] Date of Birth: 09/23/1950

## 2021-05-10 ENCOUNTER — Ambulatory Visit: Payer: Medicare Other

## 2021-05-10 DIAGNOSIS — Z20822 Contact with and (suspected) exposure to covid-19: Secondary | ICD-10-CM | POA: Diagnosis not present

## 2021-05-11 DIAGNOSIS — E782 Mixed hyperlipidemia: Secondary | ICD-10-CM | POA: Diagnosis not present

## 2021-05-11 DIAGNOSIS — I10 Essential (primary) hypertension: Secondary | ICD-10-CM

## 2021-05-12 ENCOUNTER — Ambulatory Visit (INDEPENDENT_AMBULATORY_CARE_PROVIDER_SITE_OTHER): Payer: Medicare Other

## 2021-05-12 ENCOUNTER — Other Ambulatory Visit: Payer: Self-pay

## 2021-05-12 ENCOUNTER — Encounter: Payer: Self-pay | Admitting: Orthopedic Surgery

## 2021-05-12 ENCOUNTER — Ambulatory Visit (INDEPENDENT_AMBULATORY_CARE_PROVIDER_SITE_OTHER): Payer: Medicare Other | Admitting: Orthopedic Surgery

## 2021-05-12 ENCOUNTER — Ambulatory Visit: Payer: Medicare Other | Attending: Neurology

## 2021-05-12 DIAGNOSIS — I639 Cerebral infarction, unspecified: Secondary | ICD-10-CM | POA: Diagnosis not present

## 2021-05-12 DIAGNOSIS — M6281 Muscle weakness (generalized): Secondary | ICD-10-CM | POA: Diagnosis not present

## 2021-05-12 DIAGNOSIS — M25562 Pain in left knee: Secondary | ICD-10-CM | POA: Diagnosis not present

## 2021-05-12 DIAGNOSIS — R2689 Other abnormalities of gait and mobility: Secondary | ICD-10-CM | POA: Diagnosis not present

## 2021-05-12 DIAGNOSIS — M1712 Unilateral primary osteoarthritis, left knee: Secondary | ICD-10-CM | POA: Diagnosis not present

## 2021-05-12 NOTE — Therapy (Signed)
[place] MAIN [facility] SERVICES [address] [place], [place], [address] Phone: [phone]   Fax:  [phone]  Physical Therapy Treatment  Patient Details  Name: [name] MRN: [id number] Date of Birth: [birth year] Referring Provider (PT): Antony Contras, MD   Encounter Date: 05/12/2021   PT End of Session - 05/13/21 0818     Visit Number 7    Number of Visits 13    Date for PT Re-Evaluation 05/26/21   Pt agreeable to 6-week period   Authorization Type Medicare primary, [employer] secondary    Authorization Time Period 04/14/21-05/26/21    Progress Note Due on Visit 10    PT Start Time 1016    PT Stop Time 1059    PT Time Calculation (min) 43 min    Equipment Utilized During Treatment Gait belt    Activity Tolerance Patient tolerated treatment well;Patient limited by pain    Behavior During Therapy WFL for tasks assessed/performed             Past Medical History:  Diagnosis Date   Arthritis    back, fingers with joint pain and swelling.  chronic back pain   Cataract    Chronic back pain    arthritis    Diverticulosis    Elevated cholesterol    takes Niacin daily and Simvastatin   GERD (gastroesophageal reflux disease) 06/2004   non-specific gastritis on EGD 06/2004   Headache(784.0)    occasionally   History of colon polyps 2004, 2009   2004:adenomatous. 2009 hyperplastic.    History of hiatal hernia    Hypertension    takes Tenoretic and Lisinopril daily   Mucoid cyst of joint 08/2013   right index finger   Osteopenia 01/2014   T score -1.1 FRAX 14%/0.5%. Stable from prior DEXA   PONV (postoperative nausea and vomiting)    Rotator cuff arthropathy    Left   Seasonal allergies    Stroke ([place]) 1998   x 2 - mild left-sided weakness   Urge incontinence    Uterine prolapse     Past Surgical History:  Procedure Laterality Date   BACK SURGERY     x 2   BELPHAROPTOSIS REPAIR Bilateral 12/14/2017   BUBBLE STUDY   02/16/2021   Procedure: BUBBLE STUDY;  Surgeon: Thayer Headings, MD;  Location: [facility] ENDOSCOPY;  Service: Cardiovascular;;   CATARACT EXTRACTION     CATARACT EXTRACTION Bilateral 10/2017   COLONOSCOPY  2004, 2009, 2014   [place]     HYSTEROSCOPY WITH D & C  12/07/2010   with resection of endometrial polyp   JOINT REPLACEMENT     KNEE ARTHROSCOPY Left 2008   lip biopsy     done at MD office Fri 11/22/13   MASS EXCISION Right 08/15/2013   Procedure: RIGHT INDEX EXCISION MASS ;  Surgeon: Tennis Must, MD;  Location: [place];  Service: Orthopedics;  Laterality: Right;   NASAL SEPTUM SURGERY     OOPHORECTOMY Right 2000   REVERSE SHOULDER ARTHROPLASTY Left 12/11/2018   REVERSE SHOULDER ARTHROPLASTY Left 12/11/2018   Procedure: LEFT REVERSE SHOULDER ARTHROPLASTY;  Surgeon: Meredith Pel, MD;  Location: [place];  Service: Orthopedics;  Laterality: Left;   SHOULDER ARTHROSCOPY WITH OPEN ROTATOR CUFF REPAIR AND DISTAL CLAVICLE ACROMINECTOMY Right 11/26/2013   Procedure: RIGHT SHOULDER ARTHROSCOPY WITH MINI OPEN ROTATOR CUFF REPAIR AND DISTAL CLAVICLE RESECTION,  SUBACROMIAL DECOMPRESSION, POSSIBLE [facility] PATCH.;  Surgeon: [name], MD;  Location: [place];  Service: Orthopedics;  Laterality: Right;   TEE WITHOUT CARDIOVERSION N/A 02/16/2021   Procedure: TRANSESOPHAGEAL ECHOCARDIOGRAM (TEE);  Surgeon: Acie Fredrickson Wonda Cheng, MD;  Location: [facility] ENDOSCOPY;  Service: Cardiovascular;  Laterality: N/A;   TOTAL KNEE ARTHROPLASTY Right 03/21/2017   TOTAL KNEE ARTHROPLASTY Right 03/21/2017   Procedure: RIGHT TOTAL KNEE ARTHROPLASTY;  Surgeon: [name], MD;  Location: [place];  Service: Orthopedics;  Laterality: Right;   TUBAL LIGATION      There were no vitals filed for this visit.   Subjective Assessment - 05/12/21 1018     Subjective Pt reports 6/10 L knee pain. Pt reports the worst pain is when she is walking.    Pertinent  History Tiea Manninen is a 65yoF who presents to OPPT on 04/14/21 for evaluation.   Pt evaluated by PT at [facility] on 11/7 while admitted for acute unsteadiness, Rt facial droop, imaging revealing of acute Lt CVA, remote infarcts seen bilat, pt scored 13/24 DG on 02/15/21. PT has been working with [place] here since 02/25/21.  PMH: HTN, Rt RCR, Lt TSA, bilat TKA.    Limitations House hold activities;Lifting    How long can you sit comfortably? unlimited    How long can you stand comfortably? Can stand through all tasks, but is easily fatigued thereafter    How long can you walk comfortably? A regular grocery trip    Diagnostic tests Xrays    Patient Stated Goals Being able to complete daily tasks    Currently in Pain? Yes    Pain Score 6     Pain Location Knee    Pain Orientation Left    Pain Onset More than a month ago    Pain Onset More than a month ago            Interventions   Pt supine on mat table:  SAQ- 1x10 LLE 2x12 RLE  Hamstring stretch (pt supine, PT assisted) 2x30 sec BLEs Gastrocsol stretch (pt supine, PT assisted) 2x30 sec BLEs Supine piriformis stretch 2x30 sec BLEs  Elsatogel donned to LLE for the following x several minutes (prior to and after removal of elastogel skin appears WNL, no adverse reaction to treatment observed or reported).   Supine SLR RLE 2x10 Supine SLR LLE 1x10  Glute bridge 10x  Floor>stand transfer x1, cuing for pt to utilize table position>half-kneeling and UE assist off support surface. Very difficult for pt, limited due to knee pain. Discontinued due to knee pain.   LLE knee mobilzations: Inferior/superior/medial/lateral glides 1 rep of 30 sec bouts medial-lateral grade I-II, 2 reps of 30 sec inferior and superior, grade I-II. Pt reports mild pain with superior and inferior mobilization.   LLE quad sets 10x - able to perform pain free  PT reports she has follow-up with her doctor regarding her ongoing L knee pain. Pt's L knee pain is currently  limiting pt's ability to perform progressive strengthening in seated and weightbearing positions as well as limiting her ability to perform balance interventions. PT and pt discuss possibly holding off on PT until determining plan with her physician or seeking a new order to address her knee pain should it continue to limit her. The pt verbalizes understanding.    Pt educated throughout session about proper posture and technique with exercises. Improved exercise technique, movement at target joints, use of target muscles after min to mod verbal, visual, tactile cues  PT Education - 05/13/21 0818     Education Details exercise technique    Person(s) Educated Patient    Methods Explanation;Demonstration;Tactile cues;Verbal cues    Comprehension Returned demonstration;Verbalized understanding;Tactile cues required              PT Short Term Goals - 04/14/21 1637       PT SHORT TERM GOAL #1   Title Pt will be independent with HEP in order to improve strength and endurance for mobility in community.    Baseline maxed out with grocery shopping    Time 2    Period Weeks    Target Date 04/28/21               PT Long Term Goals - 04/14/21 1638       PT LONG TERM GOAL #1   Title Pt to improve FOTO survey score from 55 to >60 to represent improved perception of facility in basic movement.    Baseline FOTO 55    Time 6    Period Weeks    Status New    Target Date 05/26/21      PT LONG TERM GOAL #2   Title Pt to demonstrate improved Left SLSL >20 sec without UE support.    Baseline 04/14/21: >20sec Rt; <12sec Left.    Time 6    Period Weeks    Status New    Target Date 05/26/21      PT LONG TERM GOAL #3   Title Pt to increase 6MWT from 978f to >13063fto improve ergonomic and tolerance in community mobility for IADL.    Baseline 95067f  Time 6    Period Weeks    Status New    Target Date 05/26/21      PT LONG TERM GOAL #4   Title Pt to demonstrate  ability to transition from floor to lying down to floor.    Baseline Eval: pt struggles with getting off floor due to acute on chronic DJD and weakness in 3 limbs.    Time 6    Period Weeks    Status New    Target Date 05/26/21                   Plan - 05/13/21 0819     Clinical Impression Statement PT still largely limited due to ongoing L knee pain. PT and pt discussed possibly seeking new referral to address L knee pain or to hold off on PT until pt determines next steps with her physician should it continue to limit her in sessions. Pt was able to tolerate majority of supine therex well during session without reports of pain. However, PT discussed with pt to monitor her knee symptoms following session to assist in determing next steps. The pt verbalized understanding. The pt would benefit from further skilled PT to continue to strengthen LEs and improve balance to increase ease and safety with ADLs.    Personal Factors and Comorbidities Age;Behavior Pattern;Fitness;Past/Current Experience    Examination-Activity Limitations Locomotion Level;Carry;Stairs;Stand    Examination-Participation Restrictions Driving    Stability/Clinical Decision Making Stable/Uncomplicated    Rehab Potential Good    PT Frequency 2x / week    PT Duration 6 weeks    PT Treatment/Interventions ADLs/Self Care Home Management;Electrical Stimulation;Therapeutic activities;Passive range of motion;Manual techniques;Patient/family education;Therapeutic exercise;Iontophoresis 4mg15m Dexamethasone;Moist Heat;Traction;Cryotherapy;Ultrasound;Functional mobility training;Neuromuscular re-education;Balance training;Dry needling;Joint Manipulations;Stair training;Gait training;DME Instruction    PT Next Visit Plan progress endurance with overground AMB,  strenghtening, higher level balance, supine therex and STM/mobs to pt tolerance    PT Home Exercise Plan ?Access Code: [email or web address]; no updates - check in with pt for exercise  response to determine progression or modification of interventions; 1/17: Access Code: [email or web address]    Consulted and Agree with Plan of Care Patient             Patient will benefit from skilled therapeutic intervention in order to improve the following deficits and impairments:  Decreased endurance, Decreased strength, Decreased coordination, Improper body mechanics, Cardiopulmonary status limiting activity, Decreased activity tolerance, Decreased balance, Decreased mobility, Postural dysfunction  Visit Diagnosis: Muscle weakness (generalized)  Other abnormalities of gait and mobility  Left knee pain, unspecified chronicity     Problem List Patient Active Problem List   Diagnosis Date Noted   ICH (intracerebral hemorrhage) ([place]) 02/13/2021   Iron deficiency 10/07/2020   Aortic atherosclerosis ([place]) 03/17/2020   Lung nodule seen on imaging study 02/24/2020   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   Chronic left shoulder pain 08/23/2018   Chronic diarrhea 12/07/2014   Segmental colitis with rectal bleeding ([place]) 09/12/2014   Routine general medical examination at a health care facility 06/05/2014   Overweight 06/13/2011   Hyperlipidemia 02/17/2007   Essential hypertension 01/08/2007   Osteoarthritis 01/08/2007    Zollie Pee, PT 05/13/2021, 8:31 AM  [place] MAIN [facility] SERVICES [address] [place], [place], [address] Phone: [phone]   Fax:  [phone]  Name: KASHARA BLOCHER MRN: [id number] Date of Birth: 1950-08-08

## 2021-05-13 ENCOUNTER — Encounter: Payer: Self-pay | Admitting: Orthopedic Surgery

## 2021-05-13 MED ORDER — BUPIVACAINE HCL 0.25 % IJ SOLN
4.0000 mL | INTRAMUSCULAR | Status: AC | PRN
  Administered 2021-05-12: 4 mL via INTRA_ARTICULAR

## 2021-05-13 MED ORDER — LIDOCAINE HCL 1 % IJ SOLN
5.0000 mL | INTRAMUSCULAR | Status: AC | PRN
  Administered 2021-05-12: 5 mL

## 2021-05-13 MED ORDER — METHYLPREDNISOLONE ACETATE 40 MG/ML IJ SUSP
40.0000 mg | INTRAMUSCULAR | Status: AC | PRN
  Administered 2021-05-12: 40 mg via INTRA_ARTICULAR

## 2021-05-13 NOTE — Progress Notes (Addendum)
Office Visit Note   Patient: [name]           Date of Birth: Dec 06, 1950           MRN: [id number] Visit Date: 05/12/2021 Requested by: Hoyt Koch, MD [address] [place],  [place] [address] PCP: Hoyt Koch, MD  Subjective: Chief Complaint  Patient presents with   Left Knee - New Patient (Initial Visit)    HPI: [name] is a 71 year old patient with left knee pain.  She noticed it about 5 to 6 weeks ago.  Denies any injury or fall that started the pain.  She does report having a stroke in November 2022.  She has been going to physical therapy and thinks that may have aggravated her knee.  Takes Tylenol for her pain occasionally.  She believes she had right knee arthroscopy years ago and has undergone right knee replacement 4 years ago.  Her pain is actually improved slightly over the past 2 weeks.  Symptoms are worse with standing and walking.  Denies much in the way of mechanical symptoms.              ROS: All systems reviewed are negative as they relate to the chief complaint within the history of present illness.  Patient denies  fevers or chills.   Assessment & Plan: Visit Diagnoses:  1. Left knee pain, unspecified chronicity     Plan: Impression is left knee pain with arthritis present on plain radiographs.  She states that her right knee replacement done elsewhere over 10 years ago was very difficult to get over.  She is not really interested in pursuing that at this time.  We both agree that an injection could press the reset button for her knee to get it back to the way it was before 6 weeks ago.  Injection performed today.  We talked about avoiding a lot of loadbearing exercise.  Follow-up with [place] as needed.  I think knee replacement may be in her future but she really needs to get over this stroke that happened in a couple of months ago before considering that intervention.  Follow-Up Instructions: Return if symptoms worsen or fail to improve.    Orders:  Orders Placed This Encounter  Procedures   XR KNEE 3 VIEW LEFT   No orders of the defined types were placed in this encounter.     Procedures: Large Joint Inj: L knee on 05/12/2021 6:52 AM Indications: diagnostic evaluation, joint swelling and pain Details: 18 G 1.5 in needle, superolateral approach  Arthrogram: No  Medications: 5 mL lidocaine 1 %; 40 mg methylPREDNISolone acetate 40 MG/ML; 4 mL bupivacaine 0.25 % Outcome: tolerated well, no immediate complications Procedure, treatment alternatives, risks and benefits explained, specific risks discussed. Consent was given by the patient. Immediately prior to procedure a time out was called to verify the correct patient, procedure, equipment, support staff and site/side marked as required. Patient was prepped and draped in the usual sterile fashion.      Clinical Data: No additional findings.  Objective: Vital Signs: LMP 02/09/2005   Physical Exam:   Constitutional: Patient appears well-developed HEENT:  Head: Normocephalic Eyes:EOM are normal Neck: Normal range of motion Cardiovascular: Normal rate Pulmonary/chest: Effort normal Neurologic: Patient is alert Skin: Skin is warm Psychiatric: Patient has normal mood and affect   Ortho Exam: Ortho exam demonstrates no groin pain on the left with internal extra rotation of the leg.  Collateral crucial ligaments are  stable.  No effusion in the left knee.  Extensor mechanism intact.  No masses lymphadenopathy or skin changes noted in the left leg region.  Pedal pulses palpable.  Ankle dorsiflexion intact.  Range of motion is about 0-100.  Specialty Comments:  No specialty comments available.  Imaging: XR KNEE 3 VIEW LEFT  Result Date: 05/13/2021 AP lateral merchant radiographs left knee reviewed.  Mild varus alignment present.  End-stage tricompartmental arthritis is present worse on the medial side with sclerosis and no joint space remaining.  No acute fracture.   Patella height normal relative to the distal femur.    PMFS History: Patient Active Problem List   Diagnosis Date Noted   ICH (intracerebral hemorrhage) ([place]) 02/13/2021   Iron deficiency 10/07/2020   Aortic atherosclerosis ([place]) 03/17/2020   Lung nodule seen on imaging study 02/24/2020   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   Chronic left shoulder pain 08/23/2018   Chronic diarrhea 12/07/2014   Segmental colitis with rectal bleeding ([place]) 09/12/2014   Routine general medical examination at a health care facility 06/05/2014   Overweight 06/13/2011   Hyperlipidemia 02/17/2007   Essential hypertension 01/08/2007   Osteoarthritis 01/08/2007   Past Medical History:  Diagnosis Date   Arthritis    back, fingers with joint pain and swelling.  chronic back pain   Cataract    Chronic back pain    arthritis    Diverticulosis    Elevated cholesterol    takes Niacin daily and Simvastatin   GERD (gastroesophageal reflux disease) 06/2004   non-specific gastritis on EGD 06/2004   Headache(784.0)    occasionally   History of colon polyps 2004, 2009   2004:adenomatous. 2009 hyperplastic.    History of hiatal hernia    Hypertension    takes Tenoretic and Lisinopril daily   Mucoid cyst of joint 08/2013   right index finger   Osteopenia 01/2014   T score -1.1 FRAX 14%/0.5%. Stable from prior DEXA   PONV (postoperative nausea and vomiting)    Rotator cuff arthropathy    Left   Seasonal allergies    Stroke ([place]) 1998   x 2 - mild left-sided weakness   Urge incontinence    Uterine prolapse     Family History  Problem Relation Age of Onset   Hypertension Mother    Heart disease Mother    Diabetes Father    Hypertension Father    Heart disease Father    Stroke Father    Diabetes Maternal Aunt    Diabetes Paternal Grandmother    Hypertension Paternal Grandfather    Stroke Paternal Grandfather    Colon cancer Neg Hx    Esophageal cancer Neg Hx    Rectal cancer Neg Hx     Stomach cancer Neg Hx     Past Surgical History:  Procedure Laterality Date   BACK SURGERY     x 2   BELPHAROPTOSIS REPAIR Bilateral 12/14/2017   BUBBLE STUDY  02/16/2021   Procedure: BUBBLE STUDY;  Surgeon: Thayer Headings, MD;  Location: [facility] ENDOSCOPY;  Service: Cardiovascular;;   CATARACT EXTRACTION     CATARACT EXTRACTION Bilateral 10/2017   COLONOSCOPY  2004, 2009, 2014   [place] CRYOSURGERY     HYSTEROSCOPY WITH D & C  12/07/2010   with resection of endometrial polyp   JOINT REPLACEMENT     KNEE ARTHROSCOPY Left 2008   lip biopsy  done at MD office Fri 11/22/13   MASS EXCISION Right 08/15/2013   Procedure: RIGHT INDEX EXCISION MASS ;  Surgeon: Tennis Must, MD;  Location: [place];  Service: Orthopedics;  Laterality: Right;   NASAL SEPTUM SURGERY     OOPHORECTOMY Right 2000   REVERSE SHOULDER ARTHROPLASTY Left 12/11/2018   REVERSE SHOULDER ARTHROPLASTY Left 12/11/2018   Procedure: LEFT REVERSE SHOULDER ARTHROPLASTY;  Surgeon: Meredith Pel, MD;  Location: [place];  Service: Orthopedics;  Laterality: Left;   SHOULDER ARTHROSCOPY WITH OPEN ROTATOR CUFF REPAIR AND DISTAL CLAVICLE ACROMINECTOMY Right 11/26/2013   Procedure: RIGHT SHOULDER ARTHROSCOPY WITH MINI OPEN ROTATOR CUFF REPAIR AND DISTAL CLAVICLE RESECTION, SUBACROMIAL DECOMPRESSION, POSSIBLE [facility] PATCH.;  Surgeon: [name], MD;  Location: [place];  Service: Orthopedics;  Laterality: Right;   TEE WITHOUT CARDIOVERSION N/A 02/16/2021   Procedure: TRANSESOPHAGEAL ECHOCARDIOGRAM (TEE);  Surgeon: Acie Fredrickson Wonda Cheng, MD;  Location: [facility] ENDOSCOPY;  Service: Cardiovascular;  Laterality: N/A;   TOTAL KNEE ARTHROPLASTY Right 03/21/2017   TOTAL KNEE ARTHROPLASTY Right 03/21/2017   Procedure: RIGHT TOTAL KNEE ARTHROPLASTY;  Surgeon: [name], MD;  Location: [place];  Service: Orthopedics;  Laterality: Right;   TUBAL LIGATION     Social History   Occupational  History   Occupation: Marine scientist - Human Resources-Retired    Comment: Disability/Retired  Tobacco Use   Smoking status: Never    Passive exposure: Yes   Smokeless tobacco: Never  Vaping Use   Vaping Use: Never used  Substance and Sexual Activity   Alcohol use: Not Currently    Alcohol/week: 0.0 standard drinks    Comment: about 2-3 times a year   Drug use: No   Sexual activity: Yes    Partners: Male    Birth control/protection: Surgical, Post-menopausal    Comment: BTL-1st intercourse 71 yo-More than 5 partners

## 2021-05-14 ENCOUNTER — Ambulatory Visit: Payer: Medicare Other | Admitting: Physical Therapy

## 2021-05-17 ENCOUNTER — Encounter: Payer: Self-pay | Admitting: Orthopedic Surgery

## 2021-05-17 ENCOUNTER — Ambulatory Visit: Payer: Medicare Other

## 2021-05-19 ENCOUNTER — Ambulatory Visit: Payer: Medicare Other

## 2021-05-20 DIAGNOSIS — Z20822 Contact with and (suspected) exposure to covid-19: Secondary | ICD-10-CM | POA: Diagnosis not present

## 2021-05-21 DIAGNOSIS — Z20822 Contact with and (suspected) exposure to covid-19: Secondary | ICD-10-CM | POA: Diagnosis not present

## 2021-05-24 ENCOUNTER — Ambulatory Visit: Payer: Medicare Other | Admitting: Physical Therapy

## 2021-05-26 ENCOUNTER — Ambulatory Visit: Payer: Medicare Other

## 2021-05-31 ENCOUNTER — Ambulatory Visit: Payer: Medicare Other

## 2021-06-01 ENCOUNTER — Ambulatory Visit: Payer: Medicare Other

## 2021-06-02 ENCOUNTER — Ambulatory Visit: Payer: Medicare Other

## 2021-06-07 ENCOUNTER — Ambulatory Visit: Payer: Medicare Other

## 2021-06-07 ENCOUNTER — Ambulatory Visit: Payer: Medicare Other | Admitting: Physical Therapy

## 2021-06-07 ENCOUNTER — Encounter: Payer: Medicare Other | Admitting: Occupational Therapy

## 2021-06-08 DIAGNOSIS — Z20822 Contact with and (suspected) exposure to covid-19: Secondary | ICD-10-CM | POA: Diagnosis not present

## 2021-06-09 ENCOUNTER — Ambulatory Visit: Payer: Medicare Other

## 2021-06-14 ENCOUNTER — Ambulatory Visit: Payer: Medicare Other | Admitting: Physical Therapy

## 2021-06-15 ENCOUNTER — Other Ambulatory Visit: Payer: Self-pay

## 2021-06-15 ENCOUNTER — Telehealth: Payer: Self-pay | Admitting: Gastroenterology

## 2021-06-15 DIAGNOSIS — K52832 Lymphocytic colitis: Secondary | ICD-10-CM

## 2021-06-15 MED ORDER — BUDESONIDE 3 MG PO CPEP: 9.0000 mg | ORAL_CAPSULE | Freq: Every day | ORAL | 3 refills | Status: AC

## 2021-06-15 NOTE — Telephone Encounter (Signed)
Patient called in with complaints of "explosive" diarrhea that has been going on for about a week, and she believes it is a colitis flare-up. She has been taking an anti-diarrheal medication that has helped some, but not entirely. She could not recall name of medication. No other symptoms. She was last seen by Dr. Fuller Plan on 07/01/20, and was told to call back if she had any other issues. ? ?Dr. Rush Landmark, please advise. Dr. Fuller Plan is off today. Thank you. ?

## 2021-06-15 NOTE — Telephone Encounter (Signed)
Patient called.  She is having a flare-up of her colitis.  She was instructed to call when she was having problems.  Please call patient and advise.  Thank you. ?

## 2021-06-15 NOTE — Telephone Encounter (Signed)
[name], ?Patient should come in to obtain a CBC/CMP/ESR/CRP and also perform a C. difficile PCR and GI pathogen panel and fecal calprotectin. ?Can go ahead and start her on budesonide 9 mg daily as well for history of lymphocytic colitis. ?She does not need to wait for the labs or stool studies to be performed and can be initiated on budesonide today or tomorrow whenever she picks up the medication. ?Would still recommend that those are performed however. ?Thanks. ?GM ?

## 2021-06-15 NOTE — Telephone Encounter (Signed)
Patient returned call & is aware to come in for lab work and that prescription has been sent to her pharmacy. Follow up has also been scheduled with Dr. Fuller Plan on 07/08/21 at 10:50 am.  ?

## 2021-06-15 NOTE — Telephone Encounter (Signed)
Left message for patient to call back. Orders placed.  ?

## 2021-06-16 ENCOUNTER — Ambulatory Visit: Payer: Medicare Other | Admitting: Physical Therapy

## 2021-06-16 ENCOUNTER — Other Ambulatory Visit (INDEPENDENT_AMBULATORY_CARE_PROVIDER_SITE_OTHER): Payer: Medicare Other

## 2021-06-16 DIAGNOSIS — K52832 Lymphocytic colitis: Secondary | ICD-10-CM

## 2021-06-16 LAB — COMPREHENSIVE METABOLIC PANEL
ALT: 18 U/L (ref 0–35)
AST: 30 U/L (ref 0–37)
Albumin: 4.3 g/dL (ref 3.5–5.2)
Alkaline Phosphatase: 55 U/L (ref 39–117)
BUN: 16 mg/dL (ref 6–23)
CO2: 29 mEq/L (ref 19–32)
Calcium: 10.2 mg/dL (ref 8.4–10.5)
Chloride: 103 mEq/L (ref 96–112)
Creatinine, Ser: 0.91 mg/dL (ref 0.40–1.20)
GFR: 63.72 mL/min (ref >60.00)
Glucose, Bld: 76 mg/dL (ref 70–99)
Potassium: 3.8 mEq/L (ref 3.5–5.1)
Sodium: 142 mEq/L (ref 135–145)
Total Bilirubin: 0.5 mg/dL (ref 0.2–1.2)
Total Protein: 7.1 g/dL (ref 6.0–8.3)

## 2021-06-16 LAB — CBC WITH DIFFERENTIAL/PLATELET
Basophils Absolute: 0.1 10*3/uL (ref 0.0–0.1)
Basophils Relative: 0.6 % (ref 0.0–3.0)
Eosinophils Absolute: 0.2 10*3/uL (ref 0.0–0.7)
Eosinophils Relative: 2 % (ref 0.0–5.0)
HCT: 42.3 % (ref 36.0–46.0)
Hemoglobin: 14.1 g/dL (ref 12.0–15.0)
Lymphocytes Relative: 29.1 % (ref 12.0–46.0)
Lymphs Abs: 2.7 10*3/uL (ref 0.7–4.0)
MCHC: 33.3 g/dL (ref 30.0–36.0)
MCV: 89.1 fl (ref 78.0–100.0)
Monocytes Absolute: 0.8 10*3/uL (ref 0.1–1.0)
Monocytes Relative: 8.5 % (ref 3.0–12.0)
Neutro Abs: 5.6 10*3/uL (ref 1.4–7.7)
Neutrophils Relative %: 59.8 % (ref 43.0–77.0)
Platelets: 205 10*3/uL (ref 150.0–400.0)
RBC: 4.75 Mil/uL (ref 3.87–5.11)
RDW: 13.5 % (ref 11.5–15.5)
WBC: 9.4 10*3/uL (ref 4.0–10.5)

## 2021-06-16 LAB — C-REACTIVE PROTEIN: CRP: <1 mg/dL (ref 0.5–20.0)

## 2021-06-16 LAB — SEDIMENTATION RATE: Sed Rate: 17 mm/hr (ref 0–30)

## 2021-06-17 ENCOUNTER — Other Ambulatory Visit: Payer: Medicare Other

## 2021-06-17 DIAGNOSIS — R197 Diarrhea, unspecified: Secondary | ICD-10-CM | POA: Diagnosis not present

## 2021-06-17 DIAGNOSIS — K52832 Lymphocytic colitis: Secondary | ICD-10-CM | POA: Diagnosis not present

## 2021-06-17 DIAGNOSIS — K529 Noninfective gastroenteritis and colitis, unspecified: Secondary | ICD-10-CM | POA: Diagnosis not present

## 2021-06-20 LAB — GI PROFILE, STOOL, PCR

## 2021-06-21 ENCOUNTER — Ambulatory Visit: Payer: Medicare Other | Admitting: Physical Therapy

## 2021-06-23 ENCOUNTER — Ambulatory Visit: Payer: Medicare Other | Admitting: Physical Therapy

## 2021-06-24 ENCOUNTER — Institutional Professional Consult (permissible substitution): Payer: Medicare Other | Admitting: Neurology

## 2021-06-28 ENCOUNTER — Ambulatory Visit: Payer: Medicare Other | Admitting: Physical Therapy

## 2021-06-30 ENCOUNTER — Ambulatory Visit: Payer: Medicare Other

## 2021-07-05 ENCOUNTER — Ambulatory Visit: Payer: Medicare Other | Admitting: Physical Therapy

## 2021-07-07 ENCOUNTER — Ambulatory Visit: Payer: Medicare Other

## 2021-07-08 ENCOUNTER — Encounter: Payer: Self-pay | Admitting: Gastroenterology

## 2021-07-08 ENCOUNTER — Ambulatory Visit (INDEPENDENT_AMBULATORY_CARE_PROVIDER_SITE_OTHER): Payer: Medicare Other | Admitting: Gastroenterology

## 2021-07-08 VITALS — BP 116/82 | HR 65 | Ht 62.0 in | Wt 165.0 lb

## 2021-07-08 DIAGNOSIS — K589 Irritable bowel syndrome without diarrhea: Secondary | ICD-10-CM

## 2021-07-08 DIAGNOSIS — K52832 Lymphocytic colitis: Secondary | ICD-10-CM

## 2021-07-08 NOTE — Progress Notes (Signed)
? ? ?  History of Present Illness: This is a 71 year old female returning for follow-up of lymphocytic colitis, diarrhea and urgency.  After resuming budesonide her symptoms have improved however she has intermittent difficulties with urgent postprandial diarrhea with occasional incontinence.  She has tried to eliminate foods and beverages from her diet that exacerbate symptoms. ? ?Current Medications, Allergies, Past Medical History, Past Surgical History, Family History and Social History were reviewed in [employer] record. ? ? ?Physical Exam: ?General: Well developed, well nourished, no acute distress ?Head: Normocephalic and atraumatic ?Eyes: Sclerae anicteric, EOMI ?Ears: Normal auditory acuity ?Mouth: Not examined, mask on during Covid-19 pandemic ?Lungs: Clear throughout to auscultation ?Heart: Regular rate and rhythm; no murmurs, rubs or bruits ?Abdomen: Soft, non tender and non distended. No masses, hepatosplenomegaly or hernias noted. Normal Bowel sounds ?Rectal: Not done ?Musculoskeletal: Symmetrical with no gross deformities  ?Pulses:  Normal pulses noted ?Extremities: No clubbing, cyanosis, edema or deformities noted ?Neurological: Alert oriented x 4, grossly nonfocal ?Psychological:  Alert and cooperative. Normal mood and affect ? ? ?Assessment and Recommendations: ? ?Lymphocytic colitis, IBS-D. R/O SIBO. Continue budesonide 9 mg daily.  Change hyoscyamine to 1 to 2 taken 30 minutes before all meals.  Continue to avoid foods and beverages that exacerbate symptoms.  She is advised to call in 2 weeks if her symptoms or not under much better control and we will consider a trial of dicyclomine or glycopyrrolate in place of hyoscyamine.  If symptoms are not controlled with above consider a course of Xifaxan for IBS-D. REV in 6 weeks. ?

## 2021-07-08 NOTE — Progress Notes (Signed)
? ? ?  History of Present Illness: This is a 71 year old female returning for follow-up of lymphocytic colitis.  Since beginning budesonide her diarrhea has substantially improved.  She is currently having about 1 bowel movement per day associated with urgency. ? ?Current Medications, Allergies, Past Medical History, Past Surgical History, Family History and Social History were reviewed in [employer] record. ? ? ?Physical Exam: ?General: Well developed, well nourished, no acute distress ?Head: Normocephalic and atraumatic ?Eyes: Sclerae anicteric, EOMI ?Ears: Normal auditory acuity ?Mouth: Not examined, mask on during Covid-19 pandemic ?Lungs: Clear throughout to auscultation ?Heart: Regular rate and rhythm; no murmurs, rubs or bruits ?Abdomen: Soft, non tender and non distended. No masses, hepatosplenomegaly or hernias noted. Normal Bowel sounds ?Rectal: Not done ?Musculoskeletal: Symmetrical with no gross deformities  ?Pulses:  Normal pulses noted ?Extremities: No clubbing, cyanosis, edema or deformities noted ?Neurological: Alert oriented x 4, grossly nonfocal ?Psychological:  Alert and cooperative. Normal mood and affect ? ? ?Assessment and Recommendations: ? ?Lymphocytic colitis.  Continue budesonide 9 mg daily.  Change Levsin to 1-2 before meals. ?

## 2021-07-08 NOTE — Patient Instructions (Signed)
Take your Levsin 1-2 capsules by mouth 30 mins before meals.  ? ?Continue budesonide.  ? ?Call our office in 2 weeks if your symptoms are not better.  ? ?The [place] [facility] providers would like to encourage you to use [facility] to communicate with providers for non-urgent requests or questions.  Due to long hold times on the telephone, sending your provider a message by [facility] may be a faster and more efficient way to get a response.  Please allow 48 business hours for a response.  Please remember that this is for non-urgent requests.  ? ?Thank you for choosing me and [facility]. ? ?Malcolm T. Dagoberto Ligas., MD., [facility] ? ?

## 2021-07-23 DIAGNOSIS — Z20822 Contact with and (suspected) exposure to covid-19: Secondary | ICD-10-CM | POA: Diagnosis not present

## 2021-08-03 ENCOUNTER — Ambulatory Visit (INDEPENDENT_AMBULATORY_CARE_PROVIDER_SITE_OTHER): Payer: Medicare Other | Admitting: Neurology

## 2021-08-03 ENCOUNTER — Encounter: Payer: Self-pay | Admitting: Neurology

## 2021-08-03 VITALS — BP 114/73 | HR 73 | Ht 62.0 in | Wt 169.0 lb

## 2021-08-03 DIAGNOSIS — E669 Obesity, unspecified: Secondary | ICD-10-CM | POA: Diagnosis not present

## 2021-08-03 DIAGNOSIS — Z8673 Personal history of transient ischemic attack (TIA), and cerebral infarction without residual deficits: Secondary | ICD-10-CM

## 2021-08-03 DIAGNOSIS — I610 Nontraumatic intracerebral hemorrhage in hemisphere, subcortical: Secondary | ICD-10-CM

## 2021-08-03 DIAGNOSIS — R0683 Snoring: Secondary | ICD-10-CM

## 2021-08-03 DIAGNOSIS — R351 Nocturia: Secondary | ICD-10-CM | POA: Diagnosis not present

## 2021-08-03 NOTE — Patient Instructions (Signed)

## 2021-08-03 NOTE — Progress Notes (Signed)
Subjective:  ?  ?Patient ID: [name] is a 71 y.o. female. ? ?HPI ? ? ? ?Star Age, MD, PhD ?[facility] ?[address] ?[phone] ?[place], [place] [address] ? ?Dear [name] and [name],  ? ?I saw your patient, [name], upon your kind request, in my Sleep clinic today for initial consultation of her sleep disorder, in particular, concern for underlying obstructive sleep apnea.  The patient is unaccompanied today.  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  I reviewed your office note from 04/07/2021.  Her Epworth sleepiness score is 6 out of 24, fatigue severity score is 47 out of 63.  She wakes up with a dry mouth, she has significant nocturia about 3-4 times per average night.  She does not wake up fully rested.  She has arthritis affecting her left knee, and still has some residual right knee pain.  She had right shoulder rotator cuff surgery in the past and left reverse shoulder arthroplasty.  She does not drink caffeine daily, mostly drinks decaf.  She drinks alcohol very rarely, she is a non-smoker but is exposed to secondhand smoke.  She goes to bed around midnight or 1 AM and rise time is generally between 930 and 10.  She is retired from the post office.  She lives with her husband and 5 dogs.  She has 2 biological children and 2 stepchildren and 7 grandchildren.  She feels that  her strength is slowly coming back.  She still has residual weakness on the right side from her most recent stroke and on the left side from her previous strokes.  She denies recurrent morning headaches.  She is not aware of any family history of sleep apnea. ? ? ?Her Past Medical History Is Significant For: ?Past Medical History:  ?Diagnosis Date  ? Arthritis   ? back, fingers with joint pain and swelling.  chronic back pain  ? Cataract   ? Chronic back pain   ? arthritis   ? Diverticulosis   ? Elevated cholesterol   ? takes Niacin daily and Simvastatin  ? GERD (gastroesophageal reflux disease) 06/2004  ? non-specific gastritis on EGD 06/2004  ? Headache(784.0)   ? occasionally  ? History of colon polyps 2004, 2009  ? 2004:adenomatous. 2009 hyperplastic.   ? History of hiatal hernia   ? Hypertension   ? takes Tenoretic and Lisinopril daily  ? Mucoid cyst of joint 08/2013  ? right index finger  ? Osteopenia 01/2014  ? T score -1.1 FRAX 14%/0.5%. Stable from prior DEXA  ? PONV (postoperative nausea and vomiting)   ? Rotator cuff arthropathy   ? Left  ? Seasonal allergies   ? Stroke [facility]) 1998  ? x 2 - mild left-sided weakness  ? Urge incontinence   ? Uterine prolapse   ? ? ?Her Past Surgical History Is Significant For: ?Past Surgical History:  ?Procedure Laterality Date  ? BACK SURGERY    ? x 2  ? BELPHAROPTOSIS REPAIR Bilateral 12/14/2017  ? BUBBLE STUDY  02/16/2021  ? Procedure: BUBBLE STUDY;  Surgeon: Thayer Headings, MD;  Location: [place];  Service: Cardiovascular;;  ? CATARACT EXTRACTION    ? CATARACT EXTRACTION Bilateral 10/2017  ? COLONOSCOPY  2004, 2009, 2014  ? FINGER SURGERY    ? GUM SURGERY    ? GYNECOLOGIC CRYOSURGERY    ? HYSTEROSCOPY WITH D & C  12/07/2010  ? with resection of endometrial polyp  ? JOINT REPLACEMENT    ? KNEE ARTHROSCOPY Left 2008  ? lip biopsy    ? done at MD office Fri 11/22/13  ? MASS EXCISION Right 08/15/2013  ? Procedure: RIGHT INDEX EXCISION MASS ;  Surgeon: Tennis Must, MD;   Location: [place];  Service: Orthopedics;  Laterality: Right;  ? NASAL SEPTUM SURGERY    ? OOPHORECTOMY Right 2000  ? REVERSE SHOULDER ARTHROPLASTY Left 12/11/2018  ? REVERSE SHOULDER ARTHROPLASTY Left 12/11/2018  ? Procedure: LEFT REVERSE SHOULDER ARTHROPLASTY;  Surgeon: Meredith Pel, MD;  Location: [place];  Service: Orthopedics;  Laterality: Left;  ? SHOULDER ARTHROSCOPY WITH OPEN ROTATOR CUFF REPAIR AND DISTAL CLAVICLE ACROMINECTOMY Right 11/26/2013  ? Procedure: RIGHT SHOULDER ARTHROSCOPY WITH MINI OPEN ROTATOR CUFF REPAIR AND DISTAL CLAVICLE RESECTION, SUBACROMIAL DECOMPRESSION, POSSIBLE [facility] PATCH.;  Surgeon: [name], MD;  Location: [place];  Service: Orthopedics;  Laterality: Right;  ? TEE WITHOUT CARDIOVERSION N/A 02/16/2021  ? Procedure: TRANSESOPHAGEAL ECHOCARDIOGRAM (TEE);  Surgeon: Acie Fredrickson Wonda Cheng, MD;  Location: [place];  Service: Cardiovascular;  Laterality: N/A;  ? TOTAL KNEE ARTHROPLASTY Right 03/21/2017  ? TOTAL KNEE ARTHROPLASTY Right 03/21/2017  ? Procedure: RIGHT TOTAL KNEE ARTHROPLASTY;  Surgeon: [name], MD;  Location: [place];  Service: Orthopedics;  Laterality: Right;  ? TUBAL LIGATION    ? ? ?Her Family History Is Significant For: ?Family History  ?Problem Relation Age of Onset  ? Hypertension Mother   ? Heart disease Mother   ? Diabetes Father   ? Hypertension Father   ? Heart disease Father   ? Stroke Father   ? Diabetes Maternal Aunt   ? Diabetes Paternal Grandmother   ? Hypertension Paternal Grandfather   ? Stroke Paternal Grandfather   ? Colon cancer Neg Hx   ? Esophageal cancer Neg Hx   ? Rectal cancer Neg Hx   ? Stomach cancer Neg Hx   ? Sleep apnea Neg Hx   ? ? ?Her Social History Is Significant For: ?Social History  ? ?Socioeconomic History  ? Marital status: Married  ?  Spouse name: Not on file  ? Number of children: 2  ? Years of education: 72  ? Highest education level: Not on file  ?Occupational History  ? Occupation: Marine scientist -  Human Resources-Retired  ?  Comment: Disability/Retired  ?Tobacco Use  ? Smoking status: Never  ?  Passive exposure: Yes  ? Smokeless tobacco: Never  ?Vaping Use  ? Vaping Use: Never used  ?Substance and Sexual Activity  ? Alcohol use: Not Currently  ?  Alcohol/week: 0.0 standard drinks  ?  Comment: about 2-3 times a year  ? Drug use: No  ? Sexual activity: Yes  ?  Partners: Male  ?  Birth control/protection: Surgical, Post-menopausal  ?  Comment: BTL-1st intercourse 71 yo-More than 5 partners  ?Other Topics Concern  ? Not on file  ?Social History Narrative  ? Tiasha grew up in [place], [place]. She lives with her husband [name]). She has 1 daughter [name]), 1 son [name])  and 2 stepchildren [facility] & Andre Lefort). They have 3 dogs ([name], [place], [name]) and 3 cats ([place], [place], [place]). She enjoys watching movies and outdoors activities.Exercise - noneCaffeine - varies depending on the day. May have between 1-4 cups daily.  Rare Soda.   ? ?Social Determinants of Health  ? ?Financial Resource Strain: Not on file  ?Food Insecurity: No Food Insecurity  ? Worried About Charity fundraiser in the Last Year: Never true  ? Ran Out of Food in the Last Year: Never true  ?Transportation Needs: No Transportation Needs  ? Lack of Transportation (Medical): No  ? Lack of Transportation (Non-Medical): No  ?Physical Activity: Not on file  ?Stress: Not on file  ?Social Connections: Not on file  ? ? ?Her Allergies Are:  ?Allergies  ?Allergen Reactions  ? Eggs Or Egg-Derived Products Other (See Comments)  ?  GI UPSET, JOINT PAIN, DIFF. SWALLOWING  ? Etodolac Other (See Comments)  ?  ABD. CRAMPING  ? Fish-Derived Products Other (See Comments)  ?  GI UPSET, JOINT PAIN, DIFF. SWALLOWING   ? Omeprazole Other (See Comments)  ?  ABD. CRAMPING  ? Pineapple Other (See Comments)  ?  GI UPSET, JOINT PAIN, DIFF. SWALLOWING  ? Gluten Meal Diarrhea  ?  GI upset  ?:  ? ?Her Current Medications Are:  ?Outpatient Encounter Medications as of 08/03/2021   ?Medication Sig  ? Ascorbic Acid (VITAMIN C) 1000 MG tablet Take 1,000 mg by mouth 2 (two) times daily.  ? aspirin EC 81 MG EC tablet Take 1 tablet (81 mg total) by mouth daily. Swallow whole.  ? atenolol-ch

## 2021-08-09 DIAGNOSIS — G4733 Obstructive sleep apnea (adult) (pediatric): Secondary | ICD-10-CM

## 2021-08-09 HISTORY — DX: Obstructive sleep apnea (adult) (pediatric): G47.33

## 2021-08-17 ENCOUNTER — Encounter: Payer: Self-pay | Admitting: Gastroenterology

## 2021-08-17 ENCOUNTER — Ambulatory Visit (INDEPENDENT_AMBULATORY_CARE_PROVIDER_SITE_OTHER): Payer: Medicare Other | Admitting: Gastroenterology

## 2021-08-17 VITALS — BP 116/64 | HR 65 | Ht 62.0 in | Wt 171.2 lb

## 2021-08-17 DIAGNOSIS — R152 Fecal urgency: Secondary | ICD-10-CM

## 2021-08-17 DIAGNOSIS — K52832 Lymphocytic colitis: Secondary | ICD-10-CM | POA: Diagnosis not present

## 2021-08-17 NOTE — Progress Notes (Signed)
? ? ?  Assessment   ?  ?Lymphocytic colitis, improving steadily. ?2.   GERD with a history of an esophageal stricture ?3.   IDA with a negative colonoscopy and EGD in November 2021 ? ? ?Recommendations  ?  ?Continue budesonide 9 mg po qd for 12 weeks. Continue hyoscyamine 0.125 mg 1-2 PO/SL ac and q4h prn. REV in 6 weeks.  ? ? ?HPI  ?  ?This is a 71 year old female returning for follow-up of lymphocytic colitis.  Her diarrhea, urgency have steadily improved.  She takes hyoscyamine before most meals.  She is having 2 loose stools per day with mild urgency.  She notes epigastric cramping following meals that improves with the regular use of hyoscyamine.  No episodes of incontinence in 2 weeks. ? ? ?Labs / Imaging  ?  ? ?  Latest Ref Rng & Units 06/16/2021  ?  2:14 PM 02/13/2021  ?  3:43 PM 02/24/2020  ? 11:38 AM  ?Hepatic Function  ?Total Protein 6.0 - 8.3 g/dL 7.1   7.4   7.3    ?Albumin 3.5 - 5.2 g/dL 4.3   4.0   4.2    ?AST 0 - 37 U/L _0 ?ALT 0 - 35 U/L _1 ?Alk Phosphatase 39 - 117 U/L 55   58   59    ?Total Bilirubin 0.2 - 1.2 mg/dL 0.5   0.4   0.6    ? ? ? ?  Latest Ref Rng & Units 06/16/2021  ?  2:14 PM 02/13/2021  ?  3:43 PM 10/05/2020  ?  3:02 PM  ?CBC  ?WBC 4.0 - 10.5 K/uL 9.4   9.5   8.4    ?Hemoglobin 12.0 - 15.0 g/dL 14.1   12.7   11.5    ?Hematocrit 36.0 - 46.0 % 42.3   40.8   35.9    ?Platelets 150.0 - 400.0 K/uL 205.0   212   219.0    ? ? ? ?No results found. ? ? ?Current Medications, Allergies, Past Medical History, Past Surgical History, Family History and Social History were reviewed in [employer] record. ? ? ?Physical Exam: ?General: Well developed, well nourished, no acute distress ?Head: Normocephalic and atraumatic ?Eyes: Sclerae anicteric, EOMI ?Ears: Normal auditory acuity ?Mouth: Not examined, mask on during Covid-19 pandemic ?Lungs: Clear throughout to auscultation ?Heart: Regular rate and rhythm; no murmurs, rubs or bruits ?Abdomen: Soft, non tender  and non distended. No masses, hepatosplenomegaly or hernias noted. Normal Bowel sounds ?Rectal: Not done ?Musculoskeletal: Symmetrical with no gross deformities  ?Pulses:  Normal pulses noted ?Extremities: No clubbing, cyanosis, edema or deformities noted ?Neurological: Alert oriented x 4, grossly nonfocal ?Psychological:  Alert and cooperative. Normal mood and affect ? ? ?Pricilla Riffle. Fuller Plan, MD 08/17/2021, 1:54 PM  ?

## 2021-08-17 NOTE — Patient Instructions (Signed)
Continue budesonide and hyoscyamine at current dose.  ? ?The [place] [facility] providers would like to encourage you to use [facility] to communicate with providers for non-urgent requests or questions.  Due to long hold times on the telephone, sending your provider a message by [facility] may be a faster and more efficient way to get a response.  Please allow 48 business hours for a response.  Please remember that this is for non-urgent requests.  ? ?Thank you for choosing me and [facility]. ? ?Malcolm T. Dagoberto Ligas., MD., [facility] ? ?

## 2021-08-23 ENCOUNTER — Ambulatory Visit (INDEPENDENT_AMBULATORY_CARE_PROVIDER_SITE_OTHER): Payer: Medicare Other | Admitting: Neurology

## 2021-08-23 DIAGNOSIS — I610 Nontraumatic intracerebral hemorrhage in hemisphere, subcortical: Secondary | ICD-10-CM

## 2021-08-23 DIAGNOSIS — Z8673 Personal history of transient ischemic attack (TIA), and cerebral infarction without residual deficits: Secondary | ICD-10-CM

## 2021-08-23 DIAGNOSIS — R351 Nocturia: Secondary | ICD-10-CM

## 2021-08-23 DIAGNOSIS — R0683 Snoring: Secondary | ICD-10-CM

## 2021-08-23 DIAGNOSIS — G4733 Obstructive sleep apnea (adult) (pediatric): Secondary | ICD-10-CM | POA: Diagnosis not present

## 2021-08-23 DIAGNOSIS — E669 Obesity, unspecified: Secondary | ICD-10-CM

## 2021-08-25 ENCOUNTER — Telehealth: Payer: Self-pay

## 2021-08-25 NOTE — Addendum Note (Signed)
Addended by: Star Age on: 08/25/2021 12:00 PM ? ? Modules accepted: Orders ? ?

## 2021-08-25 NOTE — Progress Notes (Signed)
See procedure note.

## 2021-08-25 NOTE — Procedures (Signed)
? ?  [facility] ? ?HOME SLEEP TEST (Watch PAT) REPORT ? ?STUDY DATE: 08/23/2021 ? ?DOB: Oct 26, 1950 ? ?MRN: [id number] ? ?ORDERING CLINICIAN: Star Age, MD, PhD ?  ?REFERRING CLINICIAN: Dr. Leonie Man, Frann Rider, NP  ? 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 ? ?Epworth sleepiness score: 6/24. ? ?BMI: 31.2 kg/m? ? ?FINDINGS:  ? ?Sleep Summary:  ? ?Total Recording Time (hours, min): 9 hours, 25 minutes ? ?Total Sleep Time (hours, min):  8 hours, 18 minutes  ? ?Percent REM (%):    23.4%  ? ?Respiratory Indices:  ? ?Calculated pAHI (per hour):  28.1/hour        ? ?REM pAHI:    29.6/hour      ? ?NREM pAHI: 27.6/hour ? ?Oxygen Saturation Statistics:  ?  ?Oxygen Saturation (%) Mean: 94%  ? ?Minimum oxygen saturation (%):                 81%  ? ?O2 Saturation Range (%): 81-99%   ? ?O2 Saturation (minutes) <=88%: 1.7 min ? ?Pulse Rate Statistics:  ? ?Pulse Mean (bpm):    68/min   ? ?Pulse Range (47-96/min)  ? ?IMPRESSION: OSA (obstructive sleep apnea)  ? ?RECOMMENDATION:  ?This home sleep test demonstrates moderate obstructive sleep apnea with a total AHI of 28.1/hour and O2 nadir of 81%.  Moderate to loud snoring was detected fairly consistently throughout the night.  Treatment with positive airway pressure is recommended. The patient will be advised to proceed with an autoPAP titration/trial at home for now. A full night  titration study may be considered to optimize treatment settings, if needed down the road. Please note that untreated obstructive sleep apnea may carry additional perioperative morbidity. Patients with significant obstructive sleep apnea should receive perioperative PAP therapy and the surgeons and particularly the anesthesiologist should be informed of the diagnosis and the severity of the sleep disordered breathing.  Alternative treatment options may include a dental device through dentistry or orthodontics in appropriate patients, or surgical treatment in selected patients with inspire, hypoglossal nerve stimulator. ?The patient should be cautioned not to drive, work at heights, or operate dangerous or heavy equipment when tired or sleepy. Review and reiteration of good sleep hygiene measures should be pursued with any patient. ?Other causes of the patient's symptoms, including circadian rhythm disturbances, an underlying mood disorder, medication effect and/or an underlying medical problem cannot be ruled out based on this test. Clinical correlation is recommended. The patient and her referring provider will be notified of the test results. The patient will be seen in follow up in sleep clinic at [facility]. ? ?I certify that I have reviewed the raw data recording prior to the issuance of this report in accordance with the standards of the American Academy of Sleep Medicine (AASM). ? ?INTERPRETING PHYSICIAN:  ? ?Star Age, MD, PhD  ?Board Certified in Neurology and Sleep Medicine ? ?[facility] ?[address], [address] ?[place], [place] [address] ?[phone] ? ? ? ? ? ? ? ? ? ? ? ? ? ? ? ? ?

## 2021-08-25 NOTE — Telephone Encounter (Signed)
I called pt. I advised pt that Dr. Rexene Alberts reviewed their sleep study results and found that pt has moderate OSA. Dr. Rexene Alberts recommends that pt start on autopap for treatment. I reviewed PAP compliance expectations with the pt. Pt is agreeable to starting an auto-PAP. I advised pt that an order will be sent to a DME, [facility], and [facility] will call the pt within about one week after they file with the pt's insurance. [facility] will show the pt how to use the machine, fit for masks, and troubleshoot the auto-PAP if needed. A follow up appt was made for insurance purposes with Jinny Blossom, NP on 11/01/2021 at 1145. Pt verbalized understanding to arrive 15 minutes early and bring their auto-PAP. A letter with all of this information in it will be mailed to the pt as a reminder. I verified with the pt that the address we have on file is correct. Pt verbalized understanding of results. Pt had no questions at this time but was encouraged to call back if questions arise. I have sent the order to [employer] (pt DME of choice). and have received confirmation that they have received the order. ? ?

## 2021-08-25 NOTE — Telephone Encounter (Signed)
-----   Message from Anda Latina, RN sent at 08/25/2021  1:27 PM EDT ----- ? ?----- Message ----- ?From: Star Age, MD ?Sent: 08/25/2021  12:00 PM EDT ?To: Gna-Pod 4 Results ? ?Patient referred by Dr. Leonie Man and [name], seen by me on 08/03/21, patient had a HST on 08/23/21.   ? ?Please call and notify the patient that the recent home sleep test showed obstructive sleep apnea in the moderate range. I recommend treatment in the form of autoPAP, which means, that we don't have to bring her in for a sleep study with CPAP, but will let her start using a so called autoPAP machine at home, which is a CPAP-like machine with self-adjusting pressures. We will send the order to a local DME company (of her choice, or as per insurance requirement). The DME representative will fit her with a mask, educate her on how to use the machine, how to put the mask on, etc. I have placed an order in the chart. Please send the order, talk to patient, send report to referring MD. We will need a FU in sleep clinic for 10 weeks post-PAP set up, please arrange that with me or one of our NPs. Also reinforce the need for compliance with treatment. Thanks,  ? ?Star Age, MD, PhD ?[facility]) ? ? ? ? ? ?

## 2021-08-25 NOTE — Telephone Encounter (Signed)
I called pt. No answer, left a message asking pt to call me back.   

## 2021-08-25 NOTE — Telephone Encounter (Signed)
-----   Message from Anda Latina, RN sent at 08/25/2021  1:27 PM EDT ----- ? ?----- Message ----- ?From: Star Age, MD ?Sent: 08/25/2021  12:00 PM EDT ?To: Gna-Pod 4 Results ? ?Patient referred by Dr. Leonie Man and [name], seen by me on 08/03/21, patient had a HST on 08/23/21.   ? ?Please call and notify the patient that the recent home sleep test showed obstructive sleep apnea in the moderate range. I recommend treatment in the form of autoPAP, which means, that we don't have to bring her in for a sleep study with CPAP, but will let her start using a so called autoPAP machine at home, which is a CPAP-like machine with self-adjusting pressures. We will send the order to a local DME company (of her choice, or as per insurance requirement). The DME representative will fit her with a mask, educate her on how to use the machine, how to put the mask on, etc. I have placed an order in the chart. Please send the order, talk to patient, send report to referring MD. We will need a FU in sleep clinic for 10 weeks post-PAP set up, please arrange that with me or one of our NPs. Also reinforce the need for compliance with treatment. Thanks,  ? ?Star Age, MD, PhD ?[facility]) ? ? ? ? ? ?

## 2021-09-07 ENCOUNTER — Ambulatory Visit (INDEPENDENT_AMBULATORY_CARE_PROVIDER_SITE_OTHER): Payer: Medicare Other | Admitting: Adult Health

## 2021-09-07 ENCOUNTER — Encounter: Payer: Self-pay | Admitting: Adult Health

## 2021-09-07 VITALS — BP 124/74 | HR 85 | Ht 62.0 in | Wt 165.2 lb

## 2021-09-07 DIAGNOSIS — I610 Nontraumatic intracerebral hemorrhage in hemisphere, subcortical: Secondary | ICD-10-CM

## 2021-09-07 DIAGNOSIS — R202 Paresthesia of skin: Secondary | ICD-10-CM | POA: Diagnosis not present

## 2021-09-07 DIAGNOSIS — R200 Anesthesia of skin: Secondary | ICD-10-CM | POA: Diagnosis not present

## 2021-09-07 NOTE — Patient Instructions (Signed)
Continue to monitor foot symptoms - would recommend following up with Dr. Sharlet Salina for possible need of referral to orthopedics or foot/ankle specialists for further evaluation   Continue aspirin 81 mg daily  and simvastatin  for secondary stroke prevention  Continue to follow up with PCP regarding cholesterol and blood pressure management  Maintain strict control of hypertension with blood pressure goal below 130/90 and cholesterol with LDL cholesterol (bad cholesterol) goal below 70 mg/dL.   Signs of a Stroke? Follow the BEFAST method:  Balance Watch for a sudden loss of balance, trouble with coordination or vertigo Eyes Is there a sudden loss of vision in one or both eyes? Or double vision?  Face: Ask the person to smile. Does one side of the face droop or is it numb?  Arms: Ask the person to raise both arms. Does one arm drift downward? Is there weakness or numbness of a leg? Speech: Ask the person to repeat a simple phrase. Does the speech sound slurred/strange? Is the person confused ? Time: If you observe any of these signs, call 911.       Thank you for coming to see [place] at [facility]. I hope we have been able to provide you high quality care today.  You may receive a patient satisfaction survey over the next few weeks. We would appreciate your feedback and comments so that we may continue to improve ourselves and the health of our patients.

## 2021-09-07 NOTE — Progress Notes (Signed)
[facility] [address] [place]. [place] [address] [phone]       STROKE FOLLOW UP NOTE  [name] Date of Birth:  Apr 29, 1950 Medical Record Number:  [id number]   Reason for Referral:  stroke follow up    SUBJECTIVE:   CHIEF COMPLAINT:  Chief Complaint  Patient presents with   Follow-up    Rm 3 here for 5 month f/u. Pt reports she has been doing ok since last. She has noticed some numbness in the right foot that stays along the top of her foot.     HPI:   Update 09/07/2021 JM: Patient returns for 81-monthstroke follow-up.  Overall stable without new stroke/TIA symptoms.  Believes great improvement of right hand - believes back to baseline. Does mention intermittent right foot numbness, band sensation around mid foot, occasionally into toe, worse when laying down. Present over the past couple of months.  Does have chronic lower back pain which can fluctuate, bilateral knee pain and diffuse arthritis.  Compliant on aspirin and simvastatin, denies side effects.  Blood pressure today 124/74.  Routinely monitors at home and typically stable.  Completed HST 5/13 which showed moderate OSA and currently awaiting CPAP machine.  Completed repeat MRI which showed expected evolution of ICH and no new or underlying contributing factors not previously seen.  No further concerns at this time.     History provided for reference purposes only Initial visit 03/30/2021 JM: Patient being seen for initial hospital follow-up unaccompanied.  Overall doing well.  Currently working with OT with only minimal weakness on RUE.Still some fatigue and activity intolerance more so than baseline.  Does admit to nocturia every 2-3 hours and snoring. Has not previously been tested for sleep apnea.  Completed 30-day cardiac event monitor which did not show evidence of A. fib.  Has follow-up with cardiology on 1/9 to review results.  Compliant on aspirin and simvastatin without side  effects.  Blood pressures today 117/80 -routinely monitors at home and typically 110-120s/70-80s.  No further concerns at this time  Stroke admission 02/13/2021 Ms. Stacy CONSIDINEis a 71y.o. female with history of HTN and stroke who presented on 02/13/2021 with right sided weakness and headache.  Personally reviewed hospitalization pertinent progress notes, lab work and imaging.  Evaluated by Dr. SLeonie Manfor left basal ganglia hemorrhagic infarct with indeterminate etiology, low suspicion hypertensive related as blood pressure not significantly elevated and on the low end of normal and MRI showed remote age right occipital hemorrhagic infarct as well as nonhemorrhagic right superior temporal and left cerebellar embolic infarcts.  EF 65 to 70%.  TEE unremarkable.  Recommended 30-day cardiac event monitor to evaluate for possible A. fib.  LDL 62.  A1c 5.3.  On aspirin 81 mg BID PTA and recommended aspirin 81 mg daily and continuation of simvastatin.  Home HTN meds held in setting of low and BP and recommended restarting when appropriate. Residual RUE weakness with mild right-sided ataxia.  Therapies recommended outpatient therapies and d/c'd home on 11/8       PERTINENT IMAGING  Per recent hospitalization CT head  IPH in left basal ganglia with mild edema but no mass effect  MRI  Hemorrhage at left lentiform nucleus, chronic hemorrhage in encephalomalacia at right occipital pole 2D Echo EF 65-70%. No atrial shunt  EKG SR w PAC's TEE EF 60 to 65% without evidence of thrombus or PFO LDL 62 HgbA1c 5.3    ROS:   14 system  review of systems performed and negative with exception of those listed in HPI  PMH:  Past Medical History:  Diagnosis Date   Arthritis    back, fingers with joint pain and swelling.  chronic back pain   Cataract    Chronic back pain    arthritis    Diverticulosis    Elevated cholesterol    takes Niacin daily and Simvastatin   GERD (gastroesophageal reflux disease)  06/2004   non-specific gastritis on EGD 06/2004   Headache(784.0)    occasionally   History of colon polyps 2004, 2009   2004:adenomatous. 2009 hyperplastic.    History of hiatal hernia    Hypertension    takes Tenoretic and Lisinopril daily   Mucoid cyst of joint 08/2013   right index finger   Osteopenia 01/2014   T score -1.1 FRAX 14%/0.5%. Stable from prior DEXA   PONV (postoperative nausea and vomiting)    Rotator cuff arthropathy    Left   Seasonal allergies    Stroke ([place]) 1998   x 2 - mild left-sided weakness   Urge incontinence    Uterine prolapse     PSH:  Past Surgical History:  Procedure Laterality Date   BACK SURGERY     x 2   BELPHAROPTOSIS REPAIR Bilateral 12/14/2017   BUBBLE STUDY  02/16/2021   Procedure: BUBBLE STUDY;  Surgeon: Thayer Headings, MD;  Location: [facility] ENDOSCOPY;  Service: Cardiovascular;;   CATARACT EXTRACTION     CATARACT EXTRACTION Bilateral 10/2017   COLONOSCOPY  2004, 2009, 2014   [place]     HYSTEROSCOPY WITH D & C  12/07/2010   with resection of endometrial polyp   JOINT REPLACEMENT     KNEE ARTHROSCOPY Left 2008   lip biopsy     done at MD office Fri 11/22/13   MASS EXCISION Right 08/15/2013   Procedure: RIGHT INDEX EXCISION MASS ;  Surgeon: Tennis Must, MD;  Location: [place];  Service: Orthopedics;  Laterality: Right;   NASAL SEPTUM SURGERY     OOPHORECTOMY Right 2000   REVERSE SHOULDER ARTHROPLASTY Left 12/11/2018   REVERSE SHOULDER ARTHROPLASTY Left 12/11/2018   Procedure: LEFT REVERSE SHOULDER ARTHROPLASTY;  Surgeon: Meredith Pel, MD;  Location: [place];  Service: Orthopedics;  Laterality: Left;   SHOULDER ARTHROSCOPY WITH OPEN ROTATOR CUFF REPAIR AND DISTAL CLAVICLE ACROMINECTOMY Right 11/26/2013   Procedure: RIGHT SHOULDER ARTHROSCOPY WITH MINI OPEN ROTATOR CUFF REPAIR AND DISTAL CLAVICLE RESECTION, SUBACROMIAL DECOMPRESSION, POSSIBLE [facility] PATCH.;   Surgeon: [name], MD;  Location: [place];  Service: Orthopedics;  Laterality: Right;   TEE WITHOUT CARDIOVERSION N/A 02/16/2021   Procedure: TRANSESOPHAGEAL ECHOCARDIOGRAM (TEE);  Surgeon: Acie Fredrickson Wonda Cheng, MD;  Location: [facility] ENDOSCOPY;  Service: Cardiovascular;  Laterality: N/A;   TOTAL KNEE ARTHROPLASTY Right 03/21/2017   TOTAL KNEE ARTHROPLASTY Right 03/21/2017   Procedure: RIGHT TOTAL KNEE ARTHROPLASTY;  Surgeon: [name], MD;  Location: [place];  Service: Orthopedics;  Laterality: Right;   TUBAL LIGATION      Social History:  Social History   Socioeconomic History   Marital status: Married    Spouse name: Not on file   Number of children: 2   Years of education: 12   Highest education level: Not on file  Occupational History   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 She lives with her husband [name]). She has 1 daughter [name]), 1 son [name]) and 2 stepchildren ([place]). They have 3 dogs ([name], Mazon, [name]) and 3 cats ([place], [place], [place]). She enjoys watching movies and outdoors activities.Exercise - noneCaffeine - varies depending on the day. May have between 1-4 cups daily.  Rare Soda.    Social Determinants of Health   Financial Resource Strain: Not on file  Food Insecurity: No Food Insecurity   Worried About Charity fundraiser in the Last Year: Never true   Ran Out of Food in  the Last Year: Never true  Transportation Needs: No Transportation Needs   Lack of Transportation (Medical): No   Lack of Transportation (Non-Medical): No  Physical Activity: Not on file  Stress: Not on file  Social Connections: Not on file  Intimate Partner Violence: Not on file    Family History:  Family History  Problem Relation Age of Onset   Hypertension Mother    Heart disease Mother    Diabetes Father    Hypertension Father    Heart disease Father    Stroke Father    Diabetes Maternal Aunt    Diabetes Paternal Grandmother    Hypertension Paternal Grandfather    Stroke Paternal Grandfather    Colon cancer Neg Hx    Esophageal cancer Neg Hx    Rectal cancer Neg Hx    Stomach cancer Neg Hx    Sleep apnea Neg Hx     Medications:   Current Outpatient Medications on File Prior to Visit  Medication Sig Dispense Refill   Ascorbic Acid (VITAMIN C) 1000 MG tablet Take 1,000 mg by mouth 2 (two) times daily.     aspirin EC 81 MG EC tablet Take 1 tablet (81 mg total) by mouth daily. Swallow whole. 30 tablet 11   atenolol-chlorthalidone (TENORETIC) 50-25 MG tablet Take 1 tablet by mouth daily. 90 tablet 1   b complex vitamins tablet Take 1 tablet by mouth at bedtime.     budesonide (ENTOCORT EC) 3 MG 24 hr capsule Take 9 mg by mouth daily.     Calcium Carbonate-Vitamin D (CALCIUM + D PO) Take 1 tablet by mouth at bedtime.     CHELATED MAGNESIUM PO Take 3 tablets by mouth every morning.     Cholecalciferol (D 5000) 5000 units capsule Take 5,000 Units by mouth 3 (three) times daily.     Coenzyme Q10 (COQ10) 100 MG CAPS Take 100 mg by mouth every evening.      diphenoxylate-atropine (LOMOTIL) 2.5-0.025 MG tablet TAKE 1 TABLET BY MOUTH FOUR TIMES A DAY AS NEEDED FOR DIARRHEA OR LOOSE STOOLS (Patient taking differently: Take 1 tablet by mouth 4 (four) times daily as needed for diarrhea or loose stools.) 80 tablet 2   Ferrous Sulfate (IRON) 325 (65 Fe) MG TABS Take 1 tablet (325 mg  total) by mouth  2 (two) times daily. (Patient taking differently: Take 325 mg by mouth every morning.) 60 tablet 0   Fiber POWD Take 5 mLs by mouth 2 (two) times daily. Heather's Tummy - mix in water and drink     folic acid (FOLVITE) 193 MCG tablet Take 800 mcg by mouth every evening.      hyoscyamine (LEVSIN SL) 0.125 MG SL tablet Take 1-2 tablets by mouth every 4 hours as needed (Patient taking differently: Take 0.125 mg by mouth 2 (two) times daily as needed (stomach cramps).) 90 tablet 11   ketotifen (ZADITOR) 0.025 % ophthalmic solution Place 2 drops into both eyes 2 (two) times daily as needed (allergies).     lisinopril (ZESTRIL) 2.5 MG tablet Take 1 tablet (2.5 mg total) by mouth daily. 90 tablet 1   Melatonin 3 MG TABS Take 3 mg by mouth at bedtime.      methocarbamol (ROBAXIN) 500 MG tablet TAKE 1 TABLET BY MOUTH EVERY 8 HOURS AS NEEDED FOR MUSCLE SPASMS (Patient taking differently: Take 500 mg by mouth every 8 (eight) hours as needed for muscle spasms.) 90 tablet 3   Multiple Vitamin (MULTIVITAMIN WITH MINERALS) TABS tablet Take 1 tablet by mouth at bedtime.     OVER THE COUNTER MEDICATION Take 5 drops by mouth at bedtime. Lemon Balm     OVER THE COUNTER MEDICATION Take 1 Dose by mouth at bedtime. Cats Claw liquid daily     OVER THE COUNTER MEDICATION Apply 1 application topically at bedtime. Magnesium Lotion     polyvinyl alcohol (LIQUIFILM TEARS) 1.4 % ophthalmic solution Place 1 drop into both eyes daily as needed for dry eyes.     potassium chloride SA (KLOR-CON M20) 20 MEQ tablet Take 1 tablet (20 mEq total) by mouth 2 (two) times daily. 180 tablet 1   Probiotic Product (PROBIOTIC DAILY PO) Take 1 capsule by mouth 2 (two) times daily.     simvastatin (ZOCOR) 20 MG tablet Take 1 tablet (20 mg total) by mouth daily. 90 tablet 1   Sodium Fluoride (SODIUM FLUORIDE 5000 PPM) 1.1 % PSTE Place 1 application onto teeth at bedtime.     Zinc Sulfate (ZINC 15 PO) Take 10 drops by mouth at  bedtime. Liquid Blend 10 drops equal 15 mg     No current facility-administered medications on file prior to visit.    Allergies:   Allergies  Allergen Reactions   Eggs Or Egg-Derived Products Other (See Comments)    GI UPSET, JOINT PAIN, DIFF. SWALLOWING   Etodolac Other (See Comments)    ABD. CRAMPING   Fish-Derived Products Other (See Comments)    GI UPSET, JOINT PAIN, DIFF. SWALLOWING    Omeprazole Other (See Comments)    ABD. CRAMPING   Pineapple Other (See Comments)    GI UPSET, JOINT PAIN, DIFF. SWALLOWING   Gluten Meal Diarrhea    GI upset      OBJECTIVE:  Physical Exam  Vitals:   09/07/21 1301  BP: 124/74  Pulse: 85  Weight: 165 lb 4 oz (75 kg)  Height: 5' 2"  (1.575 m)   Body mass index is 30.22 kg/m. No results found.   General: well developed, well nourished, very pleasant elderly Caucasian female, seated, in no evident distress Head: head normocephalic and atraumatic.   Neck: supple with no carotid or supraclavicular bruits Cardiovascular: regular rate and rhythm, no murmurs Musculoskeletal: no deformity Skin:  no rash/petichiae Vascular:  Normal pulses all extremities   Neurologic Exam Mental  Status: Awake and fully alert.  Fluent speech and language.  Oriented to place and time. Recent and remote memory intact. Attention span, concentration and fund of knowledge appropriate. Mood and affect appropriate.  Cranial Nerves: Pupils equal, briskly reactive to light. Extraocular movements full without nystagmus. Visual fields full to confrontation. Hearing intact. Facial sensation intact. Face, tongue, palate moves normally and symmetrically.  Motor: Normal bulk and tone. Normal strength in all tested extremity muscles Sensory.:  Slightly decreased pinprick sensation top and inner arch of right foot otherwise intact to touch , pinprick , position and vibratory sensation throughout Coordination: Rapid alternating movements normal in all extremities.  Finger-to-nose and heel-to-shin performed accurately bilaterally. Gait and Station: Arises from chair without difficulty. Stance is normal. Gait demonstrates normal stride length and balance without use of AD. Tandem walk and heel toe mild difficulty.  Reflexes: 1+ and symmetric. Toes downgoing.         ASSESSMENT: [name] is a 71 y.o. year old female with left BG hemorrhagic stroke on 02/13/2021 secondary to unknown source as well as evidence of remote age right occipital hemorrhagic infarct and nonhemorrhagic right superior temporal and left cerebellar embolic infarcts noted on imaging. Vascular risk factors include HTN, HLD, advanced age and prior strokes on imaging.  New complaints of right foot intermittent numbness     PLAN:  L BG hemorrhagic stroke:  Recovered without residual deficits Repeat MR brain 04/2021 evolution of ICH, no new concerning findings possibly contributing to [place] 30-day cardiac event monitor negative Continue aspirin 81 mg daily  and simvastatin 40 mg daily for secondary stroke prevention.   Discussed secondary stroke prevention measures and importance of close PCP follow up for aggressive stroke risk factor management including BP goal<130/90, and HLD with LDL goal<70.  Lipid panel 03/2021 LDL 74 I have gone over the pathophysiology of stroke, warning signs and symptoms, risk factors and their management in some detail with instructions to go to the closest emergency room for symptoms of concern  Moderate sleep apnea: HST 5/15 showed moderate sleep apnea.  Currently awaiting CPAP device. Has initial CPAP f/u visit 7/24 with Jinny Blossom, NP  Right foot numbness: Obtain TSH and B12.  Possibly poststroke although symptoms only present over the past 2 months.  Does have chronic low back pain and bilateral knee pain which may be contributing factor although unable to appreciate weakness nor specific radiating symptoms.  Advised to follow-up with PCP for possible  Ortho referral for further evaluation and possible need of EMG/NCV.  Unable to appreciate circulatory concerns on exam but will defer to PCP for further evaluation     Doing well from stroke standpoint without further recommendations and risk factors are managed by PCP. She may follow up PRN, as usual for our patients who are strictly being followed for stroke. If any new neurological issues should arise, request PCP place referral for evaluation by one of our neurologists. Thank you.     CC:  PCP: Hoyt Koch, MD    I spent 34 minutes of face-to-face and non-face-to-face time with patient.  This included previsit chart review, lab review, study review, order entry, electronic health record documentation, patient education regarding prior stroke with residual deficits, secondary stroke prevention measures and importance of managing stroke risk factors, right foot symptoms and possible contributing factors and answered all other questions to patient satisfaction  Frann Rider, [facility]  [facility] [address] [place], [place] [address]  Phone [phone] Fax  [phone] Note: This document was prepared with digital dictation and possible smart phrase technology. Any transcriptional errors that result from this process are unintentional.

## 2021-09-09 DIAGNOSIS — R200 Anesthesia of skin: Secondary | ICD-10-CM | POA: Diagnosis not present

## 2021-09-09 DIAGNOSIS — R202 Paresthesia of skin: Secondary | ICD-10-CM | POA: Diagnosis not present

## 2021-09-23 ENCOUNTER — Telehealth: Payer: Self-pay | Admitting: Adult Health

## 2021-09-23 NOTE — Telephone Encounter (Signed)
CMM sent to [facility] via Epic   ----- Message -----  From: Kary Kos, CMA  Sent: 09/23/2021   2:31 PM EDT  To: Vanessa Ralphs; Marchelle Gearing  Subject: new orders                                    Hey, New orders have been placed for the above pt, DOB: Dec 07, 2050 back in May, did you receive those?  Thanks     Nicole Kindred, CMA; Vanessa Ralphs It was never sent to me but I will get this processed!

## 2021-09-23 NOTE — Telephone Encounter (Signed)
Pt said [facility] has not received order for me to get a Autopap machine. [facility] for nurse to resend to order. Would like a call from the nurse.

## 2021-09-24 ENCOUNTER — Encounter: Payer: Self-pay | Admitting: Internal Medicine

## 2021-09-24 ENCOUNTER — Ambulatory Visit (INDEPENDENT_AMBULATORY_CARE_PROVIDER_SITE_OTHER): Payer: Medicare Other | Admitting: Internal Medicine

## 2021-09-24 VITALS — BP 136/88 | HR 71 | Temp 97.9°F | Ht 62.0 in | Wt 166.2 lb

## 2021-09-24 DIAGNOSIS — E611 Iron deficiency: Secondary | ICD-10-CM | POA: Diagnosis not present

## 2021-09-24 DIAGNOSIS — K50111 Crohn's disease of large intestine with rectal bleeding: Secondary | ICD-10-CM | POA: Diagnosis not present

## 2021-09-24 DIAGNOSIS — Z0000 Encounter for general adult medical examination without abnormal findings: Secondary | ICD-10-CM | POA: Diagnosis not present

## 2021-09-24 DIAGNOSIS — I10 Essential (primary) hypertension: Secondary | ICD-10-CM | POA: Diagnosis not present

## 2021-09-24 DIAGNOSIS — E2839 Other primary ovarian failure: Secondary | ICD-10-CM

## 2021-09-24 DIAGNOSIS — E782 Mixed hyperlipidemia: Secondary | ICD-10-CM | POA: Diagnosis not present

## 2021-09-24 DIAGNOSIS — I700 Atherosclerosis of aorta: Secondary | ICD-10-CM

## 2021-09-24 NOTE — Assessment & Plan Note (Signed)
BP at goal, home readings average 110s/70s. Continue atenolol/chlorthalidone 50/25 mg daily and lisinopril 2.5 mg daily. No adjustment today.

## 2021-09-24 NOTE — Assessment & Plan Note (Signed)
Still having symptoms and is currently taking budesonide. Ordered DEXA to follow up given recent steroids. We talked about how this has lower absorption and hopefully less effects on bone health.

## 2021-09-24 NOTE — Assessment & Plan Note (Signed)
Needs to continue simvastatin 20 mg daily, reviewed lipid panel and continue at same dose.

## 2021-09-24 NOTE — Assessment & Plan Note (Signed)
Hg back to normal and given GI flare currently will stop iron supplementation. If Hg drops again can resume.

## 2021-09-24 NOTE — Assessment & Plan Note (Signed)
Reviewed recent labs at goal. Continue simvastatin 20 mg daily.

## 2021-09-24 NOTE — Assessment & Plan Note (Signed)
Flu shot yearly. Covid-19 counseled. Pneumonia complete. Shingrix declines. Tetanus due at pharmacy. Colonoscopy due 2031. Mammogram due 2024, pap smear aged out and dexa ordered today. Counseled about sun safety and mole surveillance. Counseled about the dangers of distracted driving. Given 10 year screening recommendations.

## 2021-09-24 NOTE — Patient Instructions (Addendum)
We will get the bone density. You can get a tetanus shot at the pharmacy.

## 2021-09-24 NOTE — Progress Notes (Signed)
Subjective:   Patient ID: [name], female    DOB: [birth year], 71 y.o.   MRN: [id number]  HPI Here for medicare wellness, with new complaints. Please see A/P for status and treatment of chronic medical problems.   Diet: heart healthy Physical activity: sedentary Depression/mood screen: negative Hearing: intact to whispered voice Visual acuity: grossly normal with lens, performs annual eye exam  ADLs: capable Fall risk: low Home safety: good Cognitive evaluation: intact to orientation, naming, recall and repetition EOL planning: adv directives discussed, in place  [employer] Visit from 09/24/2021 in [place] at [facility]  PHQ-2 Total Score 0           02/16/2021    8:00 AM 02/23/2021   11:03 AM 03/12/2021    1:45 PM 04/21/2021    2:45 PM 09/24/2021    2:24 PM  [place] in the past year?  0 1  1  Was there an injury with Fall?  0 0  1  Fall Risk Category Calculator  0 1  2  Fall Risk Category  Low Low  Moderate  Patient Fall Risk Level High fall risk Low fall risk Moderate fall risk Moderate fall risk   Patient at Risk for Falls Due to   History of fall(s);Other (Comment) History of fall(s);Impaired balance/gait   Patient at Risk for Falls Due to - Comments   Recent CVA- ICH    Fall risk Follow up   Falls prevention discussed Falls prevention discussed     I have personally reviewed and have noted 1. The patient's medical and social history - reviewed today no changes 2. Their use of alcohol, tobacco or illicit drugs 3. Their current medications and supplements 4. The patient's functional ability including ADL's, fall risks, home safety risks and hearing or visual impairment. 5. Diet and physical activities 6. Evidence for depression or mood disorders 7. Care team reviewed and updated 8.  The patient is not on an opioid pain medication.  Patient Care Team: Hoyt Koch, MD as PCP - General (Internal  Medicine) Buford Dresser, MD as PCP - Cardiology (Cardiology) Knox Royalty, RN as Case Manager Past Medical History:  Diagnosis Date   Arthritis    back, fingers with joint pain and swelling.  chronic back pain   Cataract    Chronic back pain    arthritis    Diverticulosis    Elevated cholesterol    takes Niacin daily and Simvastatin   GERD (gastroesophageal reflux disease) 06/2004   non-specific gastritis on EGD 06/2004   Headache(784.0)    occasionally   History of colon polyps 2004, 2009   2004:adenomatous. 2009 hyperplastic.    History of hiatal hernia    Hypertension    takes Tenoretic and Lisinopril daily   Mucoid cyst of joint 08/2013   right index finger   Osteopenia 01/2014   T score -1.1 FRAX 14%/0.5%. Stable from prior DEXA   PONV (postoperative nausea and vomiting)    Rotator cuff arthropathy    Left   Seasonal allergies    Stroke ([place]) 1998   x 2 - mild left-sided weakness   Urge incontinence    Uterine prolapse    Past Surgical History:  Procedure Laterality Date   BACK SURGERY     x 2   BELPHAROPTOSIS REPAIR Bilateral 12/14/2017   BUBBLE STUDY  02/16/2021   Procedure: BUBBLE STUDY;  Surgeon: Thayer Headings, MD;  Location: [facility];  Service: Cardiovascular;;   CATARACT EXTRACTION     CATARACT EXTRACTION Bilateral 10/2017   COLONOSCOPY  2004, 2009, 2014   FINGER SURGERY     GUM SURGERY     GYNECOLOGIC CRYOSURGERY     HYSTEROSCOPY WITH D & C  12/07/2010   with resection of endometrial polyp   JOINT REPLACEMENT     KNEE ARTHROSCOPY Left 2008   lip biopsy     done at MD office Fri 11/22/13   MASS EXCISION Right 08/15/2013   Procedure: RIGHT INDEX EXCISION MASS ;  Surgeon: Tennis Must, MD;  Location: [facility];  Service: Orthopedics;  Laterality: Right;   NASAL SEPTUM SURGERY     OOPHORECTOMY Right 2000   REVERSE SHOULDER ARTHROPLASTY Left 12/11/2018   REVERSE SHOULDER ARTHROPLASTY Left 12/11/2018   Procedure: LEFT REVERSE  SHOULDER ARTHROPLASTY;  Surgeon: Meredith Pel, MD;  Location: [facility];  Service: Orthopedics;  Laterality: Left;   SHOULDER ARTHROSCOPY WITH OPEN ROTATOR CUFF REPAIR AND DISTAL CLAVICLE ACROMINECTOMY Right 11/26/2013   Procedure: RIGHT SHOULDER ARTHROSCOPY WITH MINI OPEN ROTATOR CUFF REPAIR AND DISTAL CLAVICLE RESECTION, SUBACROMIAL DECOMPRESSION, POSSIBLE [facility] PATCH.;  Surgeon: [name], MD;  Location: [facility];  Service: Orthopedics;  Laterality: Right;   TEE WITHOUT CARDIOVERSION N/A 02/16/2021   Procedure: TRANSESOPHAGEAL ECHOCARDIOGRAM (TEE);  Surgeon: Acie Fredrickson Wonda Cheng, MD;  Location[facility];  Service: Cardiovascular;  Laterality: N/A;   TOTAL KNEE ARTHROPLASTY Right 03/21/2017   TOTAL KNEE ARTHROPLASTY Right 03/21/2017   Procedure: RIGHT TOTAL KNEE ARTHROPLASTY;  Surgeon: [name], MD;  Location: [place];  Service: Orthopedics;  Laterality: Right;   TUBAL LIGATION     Family History  Problem Relation Age of Onset   Hypertension Mother    Heart disease Mother    Diabetes Father    Hypertension Father    Heart disease Father    Stroke Father    Diabetes Maternal Aunt    Diabetes Paternal Grandmother    Hypertension Paternal Grandfather    Stroke Paternal Grandfather    Colon cancer Neg Hx    Esophageal cancer Neg Hx    Rectal cancer Neg Hx    Stomach cancer Neg Hx    Sleep apnea Neg Hx      Review of Systems  Constitutional: Negative.   HENT: Negative.    Eyes: Negative.   Respiratory:  Negative for cough, chest tightness and shortness of breath.   Cardiovascular:  Negative for chest pain, palpitations and leg swelling.  Gastrointestinal:  Positive for abdominal pain and diarrhea. Negative for abdominal distention, constipation, nausea and vomiting.  Musculoskeletal:  Positive for arthralgias.  Skin: Negative.   Neurological: Negative.   Psychiatric/Behavioral: Negative.      Objective:  Physical Exam Constitutional:      Appearance: She  is well-developed.  HENT:     Head: Normocephalic and atraumatic.  Cardiovascular:     Rate and Rhythm: Normal rate and regular rhythm.  Pulmonary:     Effort: Pulmonary effort is normal. No respiratory distress.     Breath sounds: Normal breath sounds. No wheezing or rales.  Abdominal:     General: Bowel sounds are normal. There is no distension.     Palpations: Abdomen is soft.     Tenderness: There is no abdominal tenderness. There is no rebound.  Musculoskeletal:     Cervical back: Normal range of motion.  Skin:    General: Skin is warm and dry.  Neurological:  Mental Status: She is alert and oriented to person, place, and time.     Coordination: Coordination normal.    Vitals:   09/24/21 1422  BP: 136/88  Pulse: 71  Temp: 97.9 F (36.6 C)  TempSrc: Oral  SpO2: 98%  Weight: 166 lb 4 oz (75.4 kg)  Height: 5' 2"  (1.575 m)    Assessment & Plan:

## 2021-09-27 ENCOUNTER — Ambulatory Visit (INDEPENDENT_AMBULATORY_CARE_PROVIDER_SITE_OTHER): Payer: Medicare Other | Admitting: *Deleted

## 2021-09-27 DIAGNOSIS — I10 Essential (primary) hypertension: Secondary | ICD-10-CM

## 2021-09-27 DIAGNOSIS — E782 Mixed hyperlipidemia: Secondary | ICD-10-CM

## 2021-09-27 NOTE — Chronic Care Management (AMB) (Signed)
Chronic Care Management   CCM [name] Visit Note  09/27/2021 Name: [name] MRN: [id number] DOB: 12-30-1950  Subjective: [name] is a 71 y.o. year old female who is a primary care patient of Hoyt Koch, MD. The care management team was consulted for assistance with disease management and care coordination needs.    Engaged with patient by telephone for follow up visit in response to provider referral for case management and/or care coordination services.   Consent to Services:  The patient was given information about Chronic Care Management services, agreed to services, and gave verbal consent prior to initiation of services.  Please see initial visit note for detailed documentation.  Patient agreed to services and verbal consent obtained.   Assessment: Review of patient past medical history, allergies, medications, health status, including review of consultants reports, laboratory and other test data, was performed as part of comprehensive evaluation and provision of chronic care management services.   SDOH (Social Determinants of Health) assessments and interventions performed:  SDOH Interventions    Flowsheet Row Most Recent Value  SDOH Interventions   Food Insecurity Interventions Intervention Not Indicated  [continues to deny food insecurity]  Transportation Interventions Intervention Not Indicated  [patient continues to drive self]     CCM Care Plan  Allergies  Allergen Reactions   Eggs Or Egg-Derived Products Other (See Comments)    GI UPSET, JOINT PAIN, DIFF. SWALLOWING   Etodolac Other (See Comments)    ABD. CRAMPING   Fish-Derived Products Other (See Comments)    GI UPSET, JOINT PAIN, DIFF. SWALLOWING    Omeprazole Other (See Comments)    ABD. CRAMPING   Pineapple Other (See Comments)    GI UPSET, JOINT PAIN, DIFF. SWALLOWING   Gluten Meal Diarrhea    GI upset   Outpatient Encounter Medications as of 09/27/2021  Medication Sig   Ascorbic  Acid (VITAMIN C) 1000 MG tablet Take 1,000 mg by mouth 2 (two) times daily.   aspirin EC 81 MG EC tablet Take 1 tablet (81 mg total) by mouth daily. Swallow whole.   atenolol-chlorthalidone (TENORETIC) 50-25 MG tablet Take 1 tablet by mouth daily.   b complex vitamins tablet Take 1 tablet by mouth at bedtime.   budesonide (ENTOCORT EC) 3 MG 24 hr capsule Take 9 mg by mouth daily.   Calcium Carbonate-Vitamin D (CALCIUM + D PO) Take 1 tablet by mouth at bedtime.   CHELATED MAGNESIUM PO Take 3 tablets by mouth every morning.   Cholecalciferol (D 5000) 5000 units capsule Take 5,000 Units by mouth 3 (three) times daily.   Coenzyme Q10 (COQ10) 100 MG CAPS Take 100 mg by mouth every evening.    diphenoxylate-atropine (LOMOTIL) 2.5-0.025 MG tablet TAKE 1 TABLET BY MOUTH FOUR TIMES A DAY AS NEEDED FOR DIARRHEA OR LOOSE STOOLS (Patient taking differently: Take 1 tablet by mouth 4 (four) times daily as needed for diarrhea or loose stools.)   Ferrous Sulfate (IRON) 325 (65 Fe) MG TABS Take 1 tablet (325 mg total) by mouth 2 (two) times daily. (Patient taking differently: Take 325 mg by mouth every morning.)   Fiber POWD Take 5 mLs by mouth 2 (two) times daily. Heather's Tummy - mix in water and drink   folic acid (FOLVITE) 409 MCG tablet Take 800 mcg by mouth every evening.    hyoscyamine (LEVSIN SL) 0.125 MG SL tablet Take 1-2 tablets by mouth every 4 hours as needed (Patient taking differently: Take 0.125 mg by mouth 2 (  two) times daily as needed (stomach cramps).)   ketotifen (ZADITOR) 0.025 % ophthalmic solution Place 2 drops into both eyes 2 (two) times daily as needed (allergies).   lisinopril (ZESTRIL) 2.5 MG tablet Take 1 tablet (2.5 mg total) by mouth daily.   Melatonin 3 MG TABS Take 3 mg by mouth at bedtime.    methocarbamol (ROBAXIN) 500 MG tablet TAKE 1 TABLET BY MOUTH EVERY 8 HOURS AS NEEDED FOR MUSCLE SPASMS (Patient taking differently: Take 500 mg by mouth every 8 (eight) hours as needed for  muscle spasms.)   Multiple Vitamin (MULTIVITAMIN WITH MINERALS) TABS tablet Take 1 tablet by mouth at bedtime.   OVER THE COUNTER MEDICATION Take 5 drops by mouth at bedtime. Lemon Balm   OVER THE COUNTER MEDICATION Take 1 Dose by mouth at bedtime. Cats Claw liquid daily   OVER THE COUNTER MEDICATION Apply 1 application topically at bedtime. Magnesium Lotion   polyvinyl alcohol (LIQUIFILM TEARS) 1.4 % ophthalmic solution Place 1 drop into both eyes daily as needed for dry eyes.   potassium chloride SA (KLOR-CON M20) 20 MEQ tablet Take 1 tablet (20 mEq total) by mouth 2 (two) times daily.   Probiotic Product (PROBIOTIC DAILY PO) Take 1 capsule by mouth 2 (two) times daily.   simvastatin (ZOCOR) 20 MG tablet Take 1 tablet (20 mg total) by mouth daily.   Sodium Fluoride (SODIUM FLUORIDE 5000 PPM) 1.1 % PSTE Place 1 application onto teeth at bedtime.   Zinc Sulfate (ZINC 15 PO) Take 10 drops by mouth at bedtime. Liquid Blend 10 drops equal 15 mg   No facility-administered encounter medications on file as of 09/27/2021.   Patient Active Problem List   Diagnosis Date Noted   ICH (intracerebral hemorrhage) ([place]) 02/13/2021   Iron deficiency 10/07/2020   Aortic atherosclerosis ([place]) 03/17/2020   Lung nodule seen on imaging study 02/24/2020   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   Chronic left shoulder pain 08/23/2018   Chronic diarrhea 12/07/2014   Segmental colitis with rectal bleeding ([place]) 09/12/2014   Routine general medical examination at a health care facility 06/05/2014   Overweight 06/13/2011   Hyperlipidemia 02/17/2007   Essential hypertension 01/08/2007   Osteoarthritis 01/08/2007   Conditions to be addressed/monitored:  HTN and HLD  Care Plan : [name] care Manager Plan of Care  Updates made by Stacy Royalty, [name] since 09/27/2021 12:00 AM     Problem: Chronic Disease Management Needs   Priority: High     Long-Range Goal: Ongoing adherence to established plan of care for  long term chronic disease management   Start Date: 09/27/2021  Expected End Date: 09/28/2022  Priority: High  Note:   Current Barriers:  Chronic Disease Management support and education needs related to HTN and HLD Hospitalization November 5-8, 2022 for intracerebral hemorrhage/ CVA 09/27/21: Moderate fall risk: 3 falls without serious injury x last 12 months: reports last fall "in early June;" occurred due to "having bad leg cramp while sleeping"  RNCM Clinical Goal(s):  Patient will demonstrate ongoing health management independence as evidenced by HLD/ HTN; CVA        through collaboration with [name] Care manager, provider, and care team  Interventions: 1:1 collaboration with primary care provider regarding development and update of comprehensive plan of care as evidenced by provider attestation and co-signature Inter-disciplinary care team collaboration (see longitudinal plan of care) Evaluation of current treatment plan related to  self management and patient's adherence to plan as established by provider Review  of patient status, including review of consultants reports, relevant laboratory and other test results, and medications completed 09/27/21: CCM [name] CM care plan goals re-established/ extended SDOH updated: no new/ unmet concerns identified Pain assessment updated: reports intermittent, self-limiting "occasional" "aches and pains;" well managed with occasional use of prn muscle relaxer/ massage; denies pain today Falls assessment updated: reports new/ recent fall without serious injury "in early June;" was able to get self up without assistance; does not use assistive devices; reports approximately 3 falls x last 12 months- all without serious injury; provided encouragement to continue efforts at fall prevention; previously provided education around fall risks/ prevention reinforced Reviewed recent provider office visits: 09/07/21- neurology provider; 09/24/21- PCP; patient confirms no  medication changes and denies questions post-office visits; verbalizes good understanding of post- office visit instructions/ plan of care Confirms she is no longer attending outpatient rehabilitation sessions Medications discussed: reports continues to independently self-manage and denies current concerns/ issues/ questions around medications; endorses adherence to taking all medications as prescribed Reviewed upcoming scheduled provider appointments: 09/30/21- dexa/ bone density scan; 10/01/21- GI provider; 11/01/21- neuro/ CPAP; 03/25/22- PCP; patient confirms is aware of all and has plans to attend as scheduled Discussed plans with patient for ongoing care management follow up and provided patient with direct contact information for care management team     Hyperlipidemia:  (Status: 09/27/21: Goal on Track (progressing): YES.) Long Term Goal  Lab Results  Component Value Date   CHOL 136 04/07/2021   HDL 44 04/07/2021   [place] 74 04/07/2021   TRIG 97 04/07/2021   CHOLHDL 3.1 04/07/2021    Reviewed role and benefits of statin for ASCVD risk reduction; Reviewed importance of limiting foods high in cholesterol; Reviewed exercise goals and target of 150 minutes per week;  Hypertension: (Status: 09/27/21: Goal on Track (progressing): YES.)  Long Term Goal Last practice recorded BP readings:  BP Readings from Last 3 Encounters:  09/24/21 136/88  09/07/21 124/74  08/17/21 116/64  Most recent eGFR/CrCl: No results found for: EGFR  No components found for: CRCL  Evaluation of current treatment plan related to hypertension self management and patient's adherence to plan as established by provider;   Reviewed prescribed diet GI diet for chronic colitis; low salt, heart healthy, low cholesterol Discussed complications of poorly controlled blood pressure such as heart disease, stroke, circulatory complications, vision complications, kidney impairment, sexual dysfunction;  Confirmed patient  monitoring/ recording blood pressures at home BID; reviewed with patient-- reports consistent blood pressures "all good;" she reports highest blood pressure at home recently as "137/90" and states "all others have been much lower; around 120/70" Confirmed patient remains as active "as possible;" confirms she continues to walk her dogs regularly for exercise- benefits of regular activity/ exercise reinforced  Patient Goals/Self-Care Activities: As evidenced by review of EHR, collaboration with care team, and patient reporting during CCM [name] CM outreach,  Patient [place] will: Take medications as prescribed Attend all scheduled provider appointments Call pharmacy for medication refills Call provider office for new concerns or questions Continue to check blood pressures at home- the blood pressures you have reported today are in good range Continue to follow heart healthy, low salt, low cholesterol, GI, low sugar diet Continue your efforts in preventing falls     Plan: Telephone follow up appointment with care management team member scheduled for: Tuesday, March 29, 2022 at 3:00 pm The patient has been provided with contact information for the care management team and has been advised to call  with any health related questions or concerns   Stacy Rack, [name], BSN, [place] [phone]: direct office

## 2021-09-27 NOTE — Patient Instructions (Signed)
Visit [place], thank you for taking time to talk with me today. Please don't hesitate to contact me if I can be of assistance to you before our next scheduled telephone appointment  Below are the goals we discussed today:  Patient Self-Care Activities: Patient [name] will: Take medications as prescribed Attend all scheduled provider appointments Call pharmacy for medication refills Call provider office for new concerns or questions Continue to check blood pressures at home- the blood pressures you have reported today are in good range Continue to follow heart healthy, low salt, low cholesterol, GI, low sugar diet Continue your efforts in preventing falls  Our next scheduled telephone follow up visit/ appointment is scheduled on: Tuesday, March 29, 2022 at 3:00  pm- This is a PHONE [place] appointment  If you need to cancel or re-schedule our visit, please call [phone] and our care guide team will be happy to assist you.   I look forward to hearing about your progress.   Oneta Rack, RN, BSN, [place] [phone]: direct office  If you are experiencing a Mental Health or [place] or need someone to talk to, please  call the Suicide and Crisis Lifeline: 988 call the [place] National Suicide Prevention Lifeline: (671) 467-7714 or TTY: 907-281-5152 TTY (563) 569-8578) to talk to a trained counselor call 1-800-273-TALK (toll free, 24 hour hotline) go to [facility] [address] [place] [phone]) call 911   Patient verbalizes understanding of instructions and care plan provided today and agrees to view in [place]. Active MyChart status and patient understanding of how to access instructions and care plan via MyChart confirmed with patient   Preventing Falls and Fractures  Falls can be very serious, especially for older adults or people with  osteoporosis  Falls can be caused by: Tripping or slipping Slow reflexes Balance problems Reduced muscle strength Poor vision or a recent change in prescription Illness and some medications (especially blood pressure pills, diuretics, heart medicines, muscle relaxants and sleep medications) Drinking alcohol  To prevent falls outdoors: Use a can or walker if needed Wear rubber-soled shoes so you don't slip DO NOT buy "shape up" shoes with rocker bottom soles if you have balance problems.  The thick soles and shape make it more difficult to keep your balance. Put kitty litter or salt on icy sidewalks Walk on the grass if the sidewalks are slick Avoid walking on uneven ground whenever possible  T prevent falls indoors: Keep rooms clutter-free, especially hallways, stairs and paths to light switches Remove throw rugs Install night lights, especially to and in the bathroom Turn on lights before going downstairs Keep a flashlight next to your bed Buy a cordless phone to keep with you instead of jumping up to answer the phone Install grab bars in the bathroom near the shower and toilet Install rails on both sides of the stairs.  Make sure the stairs are well lit Wear slippers with non-skid soles.  Do not walk around in stockings or socks  Balance problems and dizziness are not a normal part of growing older.  If you begin having balance problems or dizziness see your doctor.  Physical Therapy can help you with many balance problems, strengthening hip and leg muscles and with gait training.  To keep your bones healthy make sure you are getting enough calcium and Vitamin D each day.  Ask your doctor or pharmacist about supplements.  Regular weight-bearing  exercise like walking, lifting weights or dancing can help strengthen bones and prevent osteoporosis.

## 2021-09-30 ENCOUNTER — Ambulatory Visit (INDEPENDENT_AMBULATORY_CARE_PROVIDER_SITE_OTHER)
Admission: RE | Admit: 2021-09-30 | Discharge: 2021-09-30 | Disposition: A | Discharge status: Home / Self Care | Payer: Medicare Other | Source: Ambulatory Visit | Attending: Internal Medicine | Admitting: Internal Medicine

## 2021-09-30 DIAGNOSIS — E2839 Other primary ovarian failure: Secondary | ICD-10-CM | POA: Diagnosis not present

## 2021-10-01 ENCOUNTER — Ambulatory Visit (INDEPENDENT_AMBULATORY_CARE_PROVIDER_SITE_OTHER): Payer: Medicare Other | Admitting: Gastroenterology

## 2021-10-01 ENCOUNTER — Encounter: Payer: Self-pay | Admitting: Gastroenterology

## 2021-10-01 VITALS — BP 122/64 | HR 68 | Ht 62.0 in | Wt 166.0 lb

## 2021-10-01 DIAGNOSIS — K52832 Lymphocytic colitis: Secondary | ICD-10-CM | POA: Diagnosis not present

## 2021-10-01 DIAGNOSIS — R197 Diarrhea, unspecified: Secondary | ICD-10-CM | POA: Diagnosis not present

## 2021-10-01 MED ORDER — BUDESONIDE 3 MG PO CPEP: 9.0000 mg | ORAL_CAPSULE | Freq: Every day | ORAL | 3 refills | Status: DC

## 2021-10-02 ENCOUNTER — Other Ambulatory Visit: Payer: Self-pay | Admitting: Internal Medicine

## 2021-10-08 DIAGNOSIS — E785 Hyperlipidemia, unspecified: Secondary | ICD-10-CM | POA: Diagnosis not present

## 2021-10-08 DIAGNOSIS — I10 Essential (primary) hypertension: Secondary | ICD-10-CM | POA: Diagnosis not present

## 2021-10-19 ENCOUNTER — Ambulatory Visit (INDEPENDENT_AMBULATORY_CARE_PROVIDER_SITE_OTHER): Payer: Medicare Other | Admitting: *Deleted

## 2021-10-19 DIAGNOSIS — I10 Essential (primary) hypertension: Secondary | ICD-10-CM

## 2021-10-19 DIAGNOSIS — E782 Mixed hyperlipidemia: Secondary | ICD-10-CM

## 2021-10-19 NOTE — Chronic Care Management (AMB) (Signed)
Chronic Care Management   CCM RN Visit Note  10/19/2021 Name: [name] MRN: [id number] DOB: August 05, 1950  Subjective: [name] is a 71 y.o. year old female who is a primary care patient of Hoyt Koch, MD. The care management team was consulted for assistance with disease management and care coordination needs.    Engaged with patient by telephone for follow up visit/ CCM RN CM case closure in response to provider referral for case management and/or care coordination services.   Consent to Services:  The patient was given information about Chronic Care Management services, agreed to services, and gave verbal consent prior to initiation of services.  Please see initial visit note for detailed documentation.  Patient agreed to services and verbal consent obtained.   Assessment: Review of patient past medical history, allergies, medications, health status, including review of consultants reports, laboratory and other test data, was performed as part of comprehensive evaluation and provision of chronic care management services.  CCM Care Plan  Allergies  Allergen Reactions   Eggs Or Egg-Derived Products Other (See Comments)    GI UPSET, JOINT PAIN, DIFF. SWALLOWING   Etodolac Other (See Comments)    ABD. CRAMPING   Fish-Derived Products Other (See Comments)    GI UPSET, JOINT PAIN, DIFF. SWALLOWING    Omeprazole Other (See Comments)    ABD. CRAMPING   Pineapple Other (See Comments)    GI UPSET, JOINT PAIN, DIFF. SWALLOWING   Gluten Meal Diarrhea    GI upset   Outpatient Encounter Medications as of 10/19/2021  Medication Sig   Ascorbic Acid (VITAMIN C) 1000 MG tablet Take 1,000 mg by mouth 2 (two) times daily.   aspirin EC 81 MG EC tablet Take 1 tablet (81 mg total) by mouth daily. Swallow whole.   atenolol-chlorthalidone (TENORETIC) 50-25 MG tablet TAKE 1 TABLET DAILY   b complex vitamins tablet Take 1 tablet by mouth at bedtime.   budesonide (ENTOCORT EC) 3  MG 24 hr capsule Take 3 capsules (9 mg total) by mouth daily.   Calcium Carbonate-Vitamin D (CALCIUM + D PO) Take 1 tablet by mouth at bedtime.   CHELATED MAGNESIUM PO Take 3 tablets by mouth every morning.   Cholecalciferol (D 5000) 5000 units capsule Take 5,000 Units by mouth 3 (three) times daily.   Coenzyme Q10 (COQ10) 100 MG CAPS Take 100 mg by mouth every evening.    diphenoxylate-atropine (LOMOTIL) 2.5-0.025 MG tablet TAKE 1 TABLET BY MOUTH FOUR TIMES A DAY AS NEEDED FOR DIARRHEA OR LOOSE STOOLS (Patient taking differently: Take 1 tablet by mouth 4 (four) times daily as needed for diarrhea or loose stools.)   Ferrous Sulfate (IRON) 325 (65 Fe) MG TABS Take 1 tablet (325 mg total) by mouth 2 (two) times daily. (Patient taking differently: Take 325 mg by mouth every morning.)   Fiber POWD Take 5 mLs by mouth 2 (two) times daily. Heather's Tummy - mix in water and drink   folic acid (FOLVITE) 710 MCG tablet Take 800 mcg by mouth every evening.    hyoscyamine (LEVSIN SL) 0.125 MG SL tablet Take 1-2 tablets by mouth every 4 hours as needed (Patient taking differently: Take 0.125 mg by mouth 2 (two) times daily as needed (stomach cramps).)   ketotifen (ZADITOR) 0.025 % ophthalmic solution Place 2 drops into both eyes 2 (two) times daily as needed (allergies).   KLOR-CON M20 20 MEQ tablet TAKE 1 TABLET TWICE A DAY   lisinopril (ZESTRIL) 2.5 MG tablet  Take 1 tablet (2.5 mg total) by mouth daily.   Melatonin 3 MG TABS Take 3 mg by mouth at bedtime.    methocarbamol (ROBAXIN) 500 MG tablet TAKE 1 TABLET BY MOUTH EVERY 8 HOURS AS NEEDED FOR MUSCLE SPASMS (Patient taking differently: Take 500 mg by mouth every 8 (eight) hours as needed for muscle spasms.)   Multiple Vitamin (MULTIVITAMIN WITH MINERALS) TABS tablet Take 1 tablet by mouth at bedtime.   OVER THE COUNTER MEDICATION Take 5 drops by mouth at bedtime. Lemon Balm   OVER THE COUNTER MEDICATION Take 1 Dose by mouth at bedtime. Cats Claw liquid  daily   OVER THE COUNTER MEDICATION Apply 1 application topically at bedtime. Magnesium Lotion   polyvinyl alcohol (LIQUIFILM TEARS) 1.4 % ophthalmic solution Place 1 drop into both eyes daily as needed for dry eyes.   Probiotic Product (PROBIOTIC DAILY PO) Take 1 capsule by mouth 2 (two) times daily.   simvastatin (ZOCOR) 20 MG tablet TAKE 1 TABLET DAILY   Sodium Fluoride (SODIUM FLUORIDE 5000 PPM) 1.1 % PSTE Place 1 application onto teeth at bedtime.   Zinc Sulfate (ZINC 15 PO) Take 10 drops by mouth at bedtime. Liquid Blend 10 drops equal 15 mg   No facility-administered encounter medications on file as of 10/19/2021.   Patient Active Problem List   Diagnosis Date Noted   ICH (intracerebral hemorrhage) ([place]) 02/13/2021   Iron deficiency 10/07/2020   Aortic atherosclerosis ([place]) 03/17/2020   Lung nodule seen on imaging study 02/24/2020   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   Chronic left shoulder pain 08/23/2018   Chronic diarrhea 12/07/2014   Segmental colitis with rectal bleeding ([place]) 09/12/2014   Routine general medical examination at a health care facility 06/05/2014   Overweight 06/13/2011   Hyperlipidemia 02/17/2007   Essential hypertension 01/08/2007   Osteoarthritis 01/08/2007   Conditions to be addressed/monitored:  HTN and HLD  Care Plan : RN care Manager Plan of Care  Updates made by Knox Royalty, RN since 10/19/2021 12:00 AM     Problem: Chronic Disease Management Needs   Priority: High     Long-Range Goal: Ongoing adherence to established plan of care for long term chronic disease management   Start Date: 09/27/2021  Expected End Date: 09/28/2022  Priority: High  Note:   Current Barriers:  Chronic Disease Management support and education needs related to HTN and HLD Hospitalization November 5-8, 2022 for intracerebral hemorrhage/ CVA 09/27/21: Moderate fall risk: 3 falls without serious injury x last 12 months: reports last fall "in early June;"  occurred due to "having bad leg cramp while sleeping"  RNCM Clinical Goal(s):  Patient will demonstrate ongoing health management independence as evidenced by HLD/ HTN; CVA        through collaboration with RN Care manager, provider, and care team  Interventions: 1:1 collaboration with primary care provider regarding development and update of comprehensive plan of care as evidenced by provider attestation and co-signature Inter-disciplinary care team collaboration (see longitudinal plan of care) Evaluation of current treatment plan related to  self management and patient's adherence to plan as established by provider Review of patient status, including review of consultants reports, relevant laboratory and other test results, and medications completed Pain assessment updated: continues to reports intermittent, self-limiting "occasional" "aches and pains;" as well as intermittent "jaw pain from TMJ;" denies pain today Medications discussed: reports continues to independently self-manage and denies current concerns/ issues/ questions around medications; endorses adherence to taking all medications as  prescribed Discussed plans with patient for ongoing care management follow up- patient denies current care coordination/ care management needs and is agreeable to CCM RN CM case closure today; verbalizes understanding to contact PCP or other care providers for any needs that arise in the future, and confirms she has contact information for all care providers     Hyperlipidemia:  (Status: 10/19/21: Goal Met.) Long Term Goal  Lab Results  Component Value Date   CHOL 136 04/07/2021   HDL 44 04/07/2021   LDLCALC 74 04/07/2021   TRIG 97 04/07/2021   CHOLHDL 3.1 04/07/2021    Reviewed importance of limiting foods high in cholesterol; Reviewed exercise goals and target of 150 minutes per week;  Hypertension: (Status: 10/19/21: Goal Met.)  Long Term Goal Last practice recorded BP readings:  BP Readings  from Last 3 Encounters:  10/01/21 122/64  09/24/21 136/88  09/07/21 124/74  Most recent eGFR/CrCl: No results found for: EGFR  No components found for: CRCL  Evaluation of current treatment plan related to hypertension self management and patient's adherence to plan as established by provider;   Reviewed prescribed diet GI diet for chronic colitis; low salt, heart healthy, low cholesterol Discussed complications of poorly controlled blood pressure such as heart disease, stroke, circulatory complications, vision complications, kidney impairment, sexual dysfunction;  Confirmed patient continues monitoring/ recording blood pressures at home; she does not have nay specific values to review today but again reports "all are good"  Confirms she has obtained and has started using her newly acquired CPAP- reports today she is "getting used to it;" encouraged her ongoing use of CPAP as prescribed Confirmed patient remains as active "as possible;" confirms she continues to walk her dogs regularly for exercise- benefits of regular activity/ exercise reinforced    Plan: No further follow up required: patient denies current care coordination/ care management needs and is agreeable to CCM RN CM case closure today; CCM RN CM case closure accordingly     Oneta Rack, RN, BSN, [place] [phone]: direct office

## 2021-11-01 ENCOUNTER — Encounter: Payer: 59 | Admitting: Adult Health

## 2021-11-01 ENCOUNTER — Encounter: Payer: Medicare Other | Admitting: Adult Health

## 2021-11-08 DIAGNOSIS — I10 Essential (primary) hypertension: Secondary | ICD-10-CM

## 2021-11-08 DIAGNOSIS — E782 Mixed hyperlipidemia: Secondary | ICD-10-CM

## 2021-11-24 NOTE — Progress Notes (Unsigned)
[facility] [address] [place]. [name] [address] [phone]       STROKE FOLLOW UP NOTE  [name] Date of Birth:  11/04/1950 Medical Record Number:  [id number]   Reason for visit: Initial CPAP follow-up    SUBJECTIVE:   CHIEF COMPLAINT:  No chief complaint on file.   HPI:   Update 11/25/2021 JM: Patient returns for initial CPAP compliance visit.  Previously seen 5 months ago for stroke follow-up and as stable with close PCP follow-up, she was advised to follow-up as needed in regard to stroke.  Seen by Dr. Rexene Alberts 4/25 and underwent HST 5/13 which showed moderate OSA with total AHI of 28.1/h and O2 nadir of 81%.  Recommend initiating AutoPap which was started on 6/26.  Review of compliance report as below with excellent usage although residual AHI 11.5.          History provided for reference purposes only Update 09/07/2021 JM: Patient returns for 35-monthstroke follow-up.  Overall stable without new stroke/TIA symptoms.  Believes great improvement of right hand - believes back to baseline. Does mention intermittent right foot numbness, band sensation around mid foot, occasionally into toe, worse when laying down. Present over the past couple of months.  Does have chronic lower back pain which can fluctuate, bilateral knee pain and diffuse arthritis.  Compliant on aspirin and simvastatin, denies side effects.  Blood pressure today 124/74.  Routinely monitors at home and typically stable.  Completed HST 5/13 which showed moderate OSA and currently awaiting CPAP machine.  Completed repeat MRI which showed expected evolution of ICH and no new or underlying contributing factors not previously seen.  No further concerns at this time.  Initial visit 03/30/2021 JM: Patient being seen for initial hospital follow-up unaccompanied.  Overall doing well.  Currently working with OT with only minimal weakness on RUE.Still some fatigue and activity intolerance  more so than baseline.  Does admit to nocturia every 2-3 hours and snoring. Has not previously been tested for sleep apnea.  Completed 30-day cardiac event monitor which did not show evidence of A. fib.  Has follow-up with cardiology on 1/9 to review results.  Compliant on aspirin and simvastatin without side effects.  Blood pressures today 117/80 -routinely monitors at home and typically 110-120s/70-80s.  No further concerns at this time  Stroke admission 02/13/2021 Ms. Stacy WEDELis a 71y.o. female with history of HTN and stroke who presented on 02/13/2021 with right sided weakness and headache.  Personally reviewed hospitalization pertinent progress notes, lab work and imaging.  Evaluated by Dr. SLeonie Manfor left basal ganglia hemorrhagic infarct with indeterminate etiology, low suspicion hypertensive related as blood pressure not significantly elevated and on the low end of normal and MRI showed remote age right occipital hemorrhagic infarct as well as nonhemorrhagic right superior temporal and left cerebellar embolic infarcts.  EF 65 to 70%.  TEE unremarkable.  Recommended 30-day cardiac event monitor to evaluate for possible A. fib.  LDL 62.  A1c 5.3.  On aspirin 81 mg BID PTA and recommended aspirin 81 mg daily and continuation of simvastatin.  Home HTN meds held in setting of low and BP and recommended restarting when appropriate. Residual RUE weakness with mild right-sided ataxia.  Therapies recommended outpatient therapies and d/c'd home on 11/8       PERTINENT IMAGING  Per recent hospitalization CT head  IPH in left basal ganglia with mild edema but no mass effect  MRI  Hemorrhage  at left lentiform nucleus, chronic hemorrhage in encephalomalacia at right occipital pole 2D Echo EF 65-70%. No atrial shunt  EKG SR w PAC's TEE EF 60 to 65% without evidence of thrombus or PFO LDL 62 HgbA1c 5.3    ROS:   14 system review of systems performed and negative with exception of those listed  in HPI  PMH:  Past Medical History:  Diagnosis Date   Arthritis    back, fingers with joint pain and swelling.  chronic back pain   Cataract    Chronic back pain    arthritis    Diverticulosis    Elevated cholesterol    takes Niacin daily and Simvastatin   GERD (gastroesophageal reflux disease) 06/2004   non-specific gastritis on EGD 06/2004   Headache(784.0)    occasionally   History of colon polyps 2004, 2009   2004:adenomatous. 2009 hyperplastic.    History of hiatal hernia    Hypertension    takes Tenoretic and Lisinopril daily   Mucoid cyst of joint 08/2013   right index finger   Osteopenia 01/2014   T score -1.1 FRAX 14%/0.5%. Stable from prior DEXA   PONV (postoperative nausea and vomiting)    Rotator cuff arthropathy    Left   Seasonal allergies    Stroke ([place]) 1998   x 2 - mild left-sided weakness   Urge incontinence    Uterine prolapse     PSH:  Past Surgical History:  Procedure Laterality Date   BACK SURGERY     x 2   BELPHAROPTOSIS REPAIR Bilateral 12/14/2017   BUBBLE STUDY  02/16/2021   Procedure: BUBBLE STUDY;  Surgeon: Thayer Headings, MD;  Location: [facility] ENDOSCOPY;  Service: Cardiovascular;;   CATARACT EXTRACTION     CATARACT EXTRACTION Bilateral 10/2017   COLONOSCOPY  2004, 2009, 2014   [place]     HYSTEROSCOPY WITH D & C  12/07/2010   with resection of endometrial polyp   JOINT REPLACEMENT     KNEE ARTHROSCOPY Left 2008   lip biopsy     done at MD office Fri 11/22/13   MASS EXCISION Right 08/15/2013   Procedure: RIGHT INDEX EXCISION MASS ;  Surgeon: Tennis Must, MD;  Location: [facility];  Service: Orthopedics;  Laterality: Right;   NASAL SEPTUM SURGERY     OOPHORECTOMY Right 2000   REVERSE SHOULDER ARTHROPLASTY Left 12/11/2018   REVERSE SHOULDER ARTHROPLASTY Left 12/11/2018   Procedure: LEFT REVERSE SHOULDER ARTHROPLASTY;  Surgeon: Meredith Pel, MD;  Location: [place];   Service: Orthopedics;  Laterality: Left;   SHOULDER ARTHROSCOPY WITH OPEN ROTATOR CUFF REPAIR AND DISTAL CLAVICLE ACROMINECTOMY Right 11/26/2013   Procedure: RIGHT SHOULDER ARTHROSCOPY WITH MINI OPEN ROTATOR CUFF REPAIR AND DISTAL CLAVICLE RESECTION, SUBACROMIAL DECOMPRESSION, POSSIBLE [facility] PATCH.;  Surgeon: [name], MD;  Location: [place];  Service: Orthopedics;  Laterality: Right;   TEE WITHOUT CARDIOVERSION N/A 02/16/2021   Procedure: TRANSESOPHAGEAL ECHOCARDIOGRAM (TEE);  Surgeon: Acie Fredrickson Wonda Cheng, MD;  Location: [facility] ENDOSCOPY;  Service: Cardiovascular;  Laterality: N/A;   TOTAL KNEE ARTHROPLASTY Right 03/21/2017   TOTAL KNEE ARTHROPLASTY Right 03/21/2017   Procedure: RIGHT TOTAL KNEE ARTHROPLASTY;  Surgeon: [name], MD;  Location: [place];  Service: Orthopedics;  Laterality: Right;   TUBAL LIGATION      Social History:  Social History   Socioeconomic History   Marital status: Married    Spouse  name: Not on file   Number of children: 2   Years of education: 63   Highest education level: Not on file  Occupational History   Occupation: Marine scientist - Human Resources-Retired    Comment: Disability/Retired  Tobacco Use   Smoking status: Never    Passive exposure: Yes   Smokeless tobacco: Never  Vaping Use   Vaping Use: Never used  Substance and Sexual Activity   Alcohol use: Not Currently    Alcohol/week: 0.0 standard drinks of alcohol    Comment: about 2-3 times a year   Drug use: No   Sexual activity: Yes    Partners: Male    Birth control/protection: Surgical, Post-menopausal    Comment: BTL-1st intercourse 71 yo-More than 5 partners  Other Topics Concern   Not on file  Social History Narrative   Puanani grew up in [place], [place]. She lives with her husband [name]). She has 1 daughter [name]), 1 son [name]) and 2 stepchildren ([place]). They have 3 dogs ([name], [place], [name]) and 3 cats ([place], [place], [place]). She enjoys watching movies and  outdoors activities.Exercise - noneCaffeine - varies depending on the day. May have between 1-4 cups daily.  Rare Soda.    Social Determinants of Health   Financial Resource Strain: Not on file  Food Insecurity: No Food Insecurity (09/27/2021)   Hunger Vital Sign    Worried About Running Out of Food in the Last Year: Never true    Ran Out of Food in the Last Year: Never true  Transportation Needs: No Transportation Needs (09/27/2021)   PRAPARE - Hydrologist (Medical): No    Lack of Transportation (Non-Medical): No  Physical Activity: Not on file  Stress: Not on file  Social Connections: Not on file  Intimate Partner Violence: Not on file    Family History:  Family History  Problem Relation Age of Onset   Hypertension Mother    Heart disease Mother    Diabetes Father    Hypertension Father    Heart disease Father    Stroke Father    Diabetes Maternal Aunt    Diabetes Paternal Grandmother    Hypertension Paternal Grandfather    Stroke Paternal Grandfather    Colon cancer Neg Hx    Esophageal cancer Neg Hx    Rectal cancer Neg Hx    Stomach cancer Neg Hx    Sleep apnea Neg Hx     Medications:   Current Outpatient Medications on File Prior to Visit  Medication Sig Dispense Refill   Ascorbic Acid (VITAMIN C) 1000 MG tablet Take 1,000 mg by mouth 2 (two) times daily.     aspirin EC 81 MG EC tablet Take 1 tablet (81 mg total) by mouth daily. Swallow whole. 30 tablet 11   atenolol-chlorthalidone (TENORETIC) 50-25 MG tablet TAKE 1 TABLET DAILY 90 tablet 1   b complex vitamins tablet Take 1 tablet by mouth at bedtime.     budesonide (ENTOCORT EC) 3 MG 24 hr capsule Take 3 capsules (9 mg total) by mouth daily. 90 capsule 3   Calcium Carbonate-Vitamin D (CALCIUM + D PO) Take 1 tablet by mouth at bedtime.     CHELATED MAGNESIUM PO Take 3 tablets by mouth every morning.     Cholecalciferol (D 5000) 5000 units capsule Take 5,000 Units by mouth 3 (three)  times daily.     Coenzyme Q10 (COQ10) 100 MG CAPS Take 100 mg by mouth every  evening.      diphenoxylate-atropine (LOMOTIL) 2.5-0.025 MG tablet TAKE 1 TABLET BY MOUTH FOUR TIMES A DAY AS NEEDED FOR DIARRHEA OR LOOSE STOOLS (Patient taking differently: Take 1 tablet by mouth 4 (four) times daily as needed for diarrhea or loose stools.) 80 tablet 2   Ferrous Sulfate (IRON) 325 (65 Fe) MG TABS Take 1 tablet (325 mg total) by mouth 2 (two) times daily. (Patient taking differently: Take 325 mg by mouth every morning.) 60 tablet 0   Fiber POWD Take 5 mLs by mouth 2 (two) times daily. Heather's Tummy - mix in water and drink     folic acid (FOLVITE) 062 MCG tablet Take 800 mcg by mouth every evening.      hyoscyamine (LEVSIN SL) 0.125 MG SL tablet Take 1-2 tablets by mouth every 4 hours as needed (Patient taking differently: Take 0.125 mg by mouth 2 (two) times daily as needed (stomach cramps).) 90 tablet 11   ketotifen (ZADITOR) 0.025 % ophthalmic solution Place 2 drops into both eyes 2 (two) times daily as needed (allergies).     KLOR-CON M20 20 MEQ tablet TAKE 1 TABLET TWICE A DAY 180 tablet 1   lisinopril (ZESTRIL) 2.5 MG tablet Take 1 tablet (2.5 mg total) by mouth daily. 90 tablet 1   Melatonin 3 MG TABS Take 3 mg by mouth at bedtime.      methocarbamol (ROBAXIN) 500 MG tablet TAKE 1 TABLET BY MOUTH EVERY 8 HOURS AS NEEDED FOR MUSCLE SPASMS (Patient taking differently: Take 500 mg by mouth every 8 (eight) hours as needed for muscle spasms.) 90 tablet 3   Multiple Vitamin (MULTIVITAMIN WITH MINERALS) TABS tablet Take 1 tablet by mouth at bedtime.     OVER THE COUNTER MEDICATION Take 5 drops by mouth at bedtime. Lemon Balm     OVER THE COUNTER MEDICATION Take 1 Dose by mouth at bedtime. Cats Claw liquid daily     OVER THE COUNTER MEDICATION Apply 1 application topically at bedtime. Magnesium Lotion     polyvinyl alcohol (LIQUIFILM TEARS) 1.4 % ophthalmic solution Place 1 drop into both eyes daily as  needed for dry eyes.     Probiotic Product (PROBIOTIC DAILY PO) Take 1 capsule by mouth 2 (two) times daily.     simvastatin (ZOCOR) 20 MG tablet TAKE 1 TABLET DAILY 90 tablet 1   Sodium Fluoride (SODIUM FLUORIDE 5000 PPM) 1.1 % PSTE Place 1 application onto teeth at bedtime.     Zinc Sulfate (ZINC 15 PO) Take 10 drops by mouth at bedtime. Liquid Blend 10 drops equal 15 mg     No current facility-administered medications on file prior to visit.    Allergies:   Allergies  Allergen Reactions   Eggs Or Egg-Derived Products Other (See Comments)    GI UPSET, JOINT PAIN, DIFF. SWALLOWING   Etodolac Other (See Comments)    ABD. CRAMPING   Fish-Derived Products Other (See Comments)    GI UPSET, JOINT PAIN, DIFF. SWALLOWING    Omeprazole Other (See Comments)    ABD. CRAMPING   Pineapple Other (See Comments)    GI UPSET, JOINT PAIN, DIFF. SWALLOWING   Gluten Meal Diarrhea    GI upset      OBJECTIVE:  Physical Exam  There were no vitals filed for this visit.  There is no height or weight on file to calculate BMI. No results found.   General: well developed, well nourished, very pleasant elderly Caucasian female, seated, in no evident distress  Head: head normocephalic and atraumatic.   Neck: supple with no carotid or supraclavicular bruits Cardiovascular: regular rate and rhythm, no murmurs Musculoskeletal: no deformity Skin:  no rash/petichiae Vascular:  Normal pulses all extremities   Neurologic Exam Mental Status: Awake and fully alert.  Fluent speech and language.  Oriented to place and time. Recent and remote memory intact. Attention span, concentration and fund of knowledge appropriate. Mood and affect appropriate.  Cranial Nerves: Pupils equal, briskly reactive to light. Extraocular movements full without nystagmus. Visual fields full to confrontation. Hearing intact. Facial sensation intact. Face, tongue, palate moves normally and symmetrically.  Motor: Normal bulk and  tone. Normal strength in all tested extremity muscles Sensory.:  Slightly decreased pinprick sensation top and inner arch of right foot otherwise intact to touch , pinprick , position and vibratory sensation throughout Coordination: Rapid alternating movements normal in all extremities. Finger-to-nose and heel-to-shin performed accurately bilaterally. Gait and Station: Arises from chair without difficulty. Stance is normal. Gait demonstrates normal stride length and balance without use of AD. Tandem walk and heel toe mild difficulty.  Reflexes: 1+ and symmetric. Toes downgoing.         ASSESSMENT: [name] is a 71 y.o. year old female with hx of left BG hemorrhagic stroke on 02/13/2021 secondary to unknown source as well as evidence of remote age right occipital hemorrhagic infarct and nonhemorrhagic right superior temporal and left cerebellar embolic infarcts noted on imaging. Vascular risk factors include HTN, HLD, advanced age, prior strokes on imaging and diagnosis of moderate OSA in 08/2021 with initiation of AutoPap.  New complaints of right foot intermittent numbness.  Returns today for initial CPAP compliance visit.     PLAN:  OSA on CPAP: Review of compliance report shows excellent usage although elevated residual AHI at 11.5.  Recommend adjusting pressures from 6-12 to 6-14. Advised to call in 1 month for repeat download.  Discussed importance of continue nightly CPAP usage and follow-up with DME company for any needed supplies or CPAP related concerns  L BG hemorrhagic stroke:  Recovered without residual deficits Repeat MR brain 04/2021 evolution of ICH, no new concerning findings possibly contributing to [place] 30-day cardiac event monitor negative Continue aspirin 81 mg daily  and simvastatin 40 mg daily for secondary stroke prevention.   Discussed secondary stroke prevention measures and importance of close PCP follow up for aggressive stroke risk factor management including  BP goal<130/90, and HLD with LDL goal<70.  Lipid panel 03/2021 LDL 74 I have gone over the pathophysiology of stroke, warning signs and symptoms, risk factors and their management in some detail with instructions to go to the closest emergency room for symptoms of concern   Right foot numbness: Obtain TSH and B12.  Possibly poststroke although symptoms only present over the past 2 months.  Does have chronic low back pain and bilateral knee pain which may be contributing factor although unable to appreciate weakness nor specific radiating symptoms.  Advised to follow-up with PCP for possible Ortho referral for further evaluation and possible need of EMG/NCV.  Unable to appreciate circulatory concerns on exam but will defer to PCP for further evaluation     Doing well from stroke standpoint without further recommendations and risk factors are managed by PCP. She may follow up PRN, as usual for our patients who are strictly being followed for stroke. If any new neurological issues should arise, request PCP place referral for evaluation by one of our neurologists. Thank you.  CC:  PCP: Hoyt Koch, MD    I spent 34 minutes of face-to-face and non-face-to-face time with patient.  This included previsit chart review, lab review, study review, order entry, electronic health record documentation, patient education regarding prior stroke with residual deficits, secondary stroke prevention measures and importance of managing stroke risk factors, right foot symptoms and possible contributing factors and answered all other questions to patient satisfaction  Frann Rider, AGNP-BC  [facility] [address] [place], [place] [address]  Phone [phone] Fax [phone] Note: This document was prepared with digital dictation and possible smart phrase technology. Any transcriptional errors that result from this process are unintentional.

## 2021-11-25 ENCOUNTER — Telehealth: Payer: Self-pay

## 2021-11-25 ENCOUNTER — Ambulatory Visit (INDEPENDENT_AMBULATORY_CARE_PROVIDER_SITE_OTHER): Payer: Medicare Other | Admitting: Adult Health

## 2021-11-25 ENCOUNTER — Encounter: Payer: Self-pay | Admitting: Adult Health

## 2021-11-25 VITALS — BP 139/87 | HR 88 | Ht 62.0 in | Wt 170.4 lb

## 2021-11-25 DIAGNOSIS — G4733 Obstructive sleep apnea (adult) (pediatric): Secondary | ICD-10-CM | POA: Diagnosis not present

## 2021-11-25 DIAGNOSIS — Z9989 Dependence on other enabling machines and devices: Secondary | ICD-10-CM

## 2021-11-25 NOTE — Telephone Encounter (Signed)
New CPAP orders have been placed for DME: [facility]. Community message sent to [facility] 11/25/2021.

## 2021-11-25 NOTE — Patient Instructions (Addendum)
Your Plan:  Continue nightly use of CPAP for apnea management   Will follow up regarding next steps for treatment as your apnea remains uncontrolled     Follow up in 3 months with Dr. Rexene Alberts or call earlier if needed     Thank you for coming to see [place] at [facility]. I hope we have been able to provide you high quality care today.  You may receive a patient satisfaction survey over the next few weeks. We would appreciate your feedback and comments so that we may continue to improve ourselves and the health of our patients.

## 2021-12-28 ENCOUNTER — Telehealth: Payer: Self-pay | Admitting: Internal Medicine

## 2021-12-28 NOTE — Telephone Encounter (Signed)
Please advise 

## 2021-12-28 NOTE — Telephone Encounter (Signed)
Patient was seen at an oral surgeon and they wanted to know if Dr Sharlet Salina would put in an order for an MRI. Patient dropped off a letter with some info and I placed in providers box up front. Call back if needed for patient [phone] or [phone]. She said if you call the surgeon they said ask for Izora Gala and she will get to the provider. Their number is [phone]

## 2021-12-29 NOTE — Telephone Encounter (Signed)
I do not have this paperwork. Do you have?

## 2021-12-29 NOTE — Telephone Encounter (Signed)
Yes, the letter was placed Dr.Crawford box

## 2021-12-29 NOTE — Telephone Encounter (Signed)
Obtained repeat download after AutoPap pressure setting changes.  Download over the past 2 weeks shows great improvement of residual AHI currently at 4.8 (previously 11.5). Will continue current pressure settings and patient will f/u as scheduled in November.

## 2021-12-30 NOTE — Telephone Encounter (Signed)
The letter from the dentist does not have any needed information to order a scan. Medicare would likely require the dentist to order this scan as they are treating her. If she wants [place] to try to order we will need records from the dentist with specific ICD 10 codes he is requesting this for as well as prior treatment and diagnostic workup. She would then need a visit with [place] to review this information. I would not guarantee that medicare would then cover this scan.

## 2021-12-31 NOTE — Telephone Encounter (Signed)
Spoke to --agreed to call the dentist to get records.

## 2022-01-05 DIAGNOSIS — M65332 Trigger finger, left middle finger: Secondary | ICD-10-CM | POA: Diagnosis not present

## 2022-01-05 DIAGNOSIS — M79642 Pain in left hand: Secondary | ICD-10-CM | POA: Diagnosis not present

## 2022-01-05 DIAGNOSIS — M19042 Primary osteoarthritis, left hand: Secondary | ICD-10-CM | POA: Diagnosis not present

## 2022-01-05 DIAGNOSIS — M1812 Unilateral primary osteoarthritis of first carpometacarpal joint, left hand: Secondary | ICD-10-CM | POA: Diagnosis not present

## 2022-02-16 DIAGNOSIS — M65332 Trigger finger, left middle finger: Secondary | ICD-10-CM | POA: Diagnosis not present

## 2022-02-28 ENCOUNTER — Encounter: Payer: Self-pay | Admitting: Neurology

## 2022-02-28 ENCOUNTER — Ambulatory Visit (INDEPENDENT_AMBULATORY_CARE_PROVIDER_SITE_OTHER): Payer: Medicare Other | Admitting: Neurology

## 2022-02-28 VITALS — BP 113/76 | HR 62 | Ht 62.0 in | Wt 170.0 lb

## 2022-02-28 DIAGNOSIS — G4733 Obstructive sleep apnea (adult) (pediatric): Secondary | ICD-10-CM

## 2022-02-28 NOTE — Progress Notes (Signed)
Subjective:    Patient ID: [name] is a 71 y.o. female.  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  The patient is unaccompanied today.  I first met her at the request of Frann Rider, NP and Dr. Leonie Man on 08/03/2021, at which time she reported snoring and significant nocturia.  She was advised to proceed with a sleep study.  She had a home sleep test on 08/21/2021 which indicated moderate obstructive sleep apnea with an AHI of 28.1/h, O2 nadir 81%.  She started AutoPap therapy in late June 2023.  She had an interim follow-up appointment with Frann Rider, NP in sleep clinic on 11/25/2021, at which time she was compliant with her AutoPap, residual AHI was elevated around 11/h, pressure range was 6 to 12 cm.  Maximum AutoPap pressure was increased to 15 cm at the time.   Today, 02/28/2022: I reviewed her AutoPap compliance data from 01/25/2022 through 02/23/2022, which is a total of 30 days, during which time she used her machine every night with percent use days greater than 4 hours and 100%, indicating superb compliance with an average usage of 6 hours and 54 minutes, residual AHI mildly elevated, improved from before at 7.2/h, 95th percentile of pressure at 14.3 cm with a range of 6 to 15 cm with EPR of 3.  Leak on the higher side with some  fluctuation, increased leak recently, with the 95th percentile at 18.9 L/min.  She reports doing little better with her mask seal, she had a new mask fit about a week ago and has started using a new mask about 4 or 5 days ago, events fluctuate, she is motivated to continue with treatment.  She is currently using a nasal cushion interface, the hose comes down from the top of her head.  She likes this feature as she is restless and a side sleeper.  The patient's allergies, current medications, family history, past medical history, past social history, past surgical history and problem list were reviewed and updated as appropriate.    Previously:   08/03/21: (She) reports snoring and significant nocturia.  I reviewed your office note from 04/07/2021.  Her Epworth sleepiness score is 6 out of 24, fatigue severity score is 47 out of 63.  She wakes up with a dry mouth, she has significant nocturia about 3-4 times per average night.  She does not wake up fully rested.  She has arthritis affecting her left knee, and still has some residual right knee pain.  She had right shoulder rotator cuff surgery in the past and left reverse shoulder arthroplasty.  She does not drink caffeine daily, mostly drinks decaf.  She drinks alcohol very rarely, she is a non-smoker but is exposed to secondhand smoke.  She goes to bed around midnight or 1 AM and rise time is generally between 930 and 10.  She is retired from the post office.  She lives with her husband and 5 dogs.  She has  2 biological children and 2 stepchildren and 7 grandchildren.  She feels that her strength is slowly coming back.  She still has residual weakness on the right side from her most recent stroke and on the left side from her previous strokes.  She denies recurrent morning headaches.  She is not aware of any family history of sleep apnea.    Her Past Medical History Is Significant For: Past Medical History:  Diagnosis Date   Arthritis    back, fingers  with joint pain and swelling.  chronic back pain   Cataract    Chronic back pain    arthritis    Diverticulosis    Elevated cholesterol    takes Niacin daily and Simvastatin   GERD (gastroesophageal reflux disease) 06/2004   non-specific gastritis on EGD 06/2004   Headache(784.0)    occasionally   History of colon polyps 2004, 2009   2004:adenomatous. 2009 hyperplastic.    History of hiatal hernia    Hypertension    takes Tenoretic and Lisinopril daily   Mucoid cyst of joint 08/2013   right index finger   Osteopenia 01/2014   T score -1.1 FRAX 14%/0.5%. Stable from prior DEXA   PONV (postoperative nausea and vomiting)    Rotator cuff arthropathy    Left   Seasonal allergies    Stroke ([facility]) 1998   x 2 - mild left-sided weakness   Urge incontinence    Uterine prolapse     Her Past Surgical History Is Significant For: Past Surgical History:  Procedure Laterality Date   BACK SURGERY     x 2   BELPHAROPTOSIS REPAIR Bilateral 12/14/2017   BUBBLE STUDY  02/16/2021   Procedure: BUBBLE STUDY;  Surgeon: Thayer Headings, MD;  Location: [facility] ENDOSCOPY;  Service: Cardiovascular;;   CATARACT EXTRACTION     CATARACT EXTRACTION Bilateral 10/2017   COLONOSCOPY  2004, 2009, 2014   [place]     HYSTEROSCOPY WITH D & C  12/07/2010   with resection of endometrial polyp   JOINT REPLACEMENT     KNEE ARTHROSCOPY Left 2008   lip biopsy     done at MD office Fri 11/22/13   MASS EXCISION Right 08/15/2013   Procedure: RIGHT INDEX EXCISION MASS ;  Surgeon: Tennis Must, MD;  Location: [place];  Service: Orthopedics;  Laterality: Right;   NASAL SEPTUM SURGERY     OOPHORECTOMY Right 2000   REVERSE SHOULDER ARTHROPLASTY Left 12/11/2018   REVERSE SHOULDER ARTHROPLASTY Left 12/11/2018   Procedure: LEFT REVERSE SHOULDER ARTHROPLASTY;  Surgeon: Meredith Pel, MD;  Location: [place];  Service: Orthopedics;  Laterality: Left;   SHOULDER  ARTHROSCOPY WITH OPEN ROTATOR CUFF REPAIR AND DISTAL CLAVICLE ACROMINECTOMY Right 11/26/2013   Procedure: RIGHT SHOULDER ARTHROSCOPY WITH MINI OPEN ROTATOR CUFF REPAIR AND DISTAL CLAVICLE RESECTION, SUBACROMIAL DECOMPRESSION, POSSIBLE [facility] PATCH.;  Surgeon: [name], MD;  Location: [place];  Service: Orthopedics;  Laterality: Right;   TEE WITHOUT CARDIOVERSION N/A 02/16/2021   Procedure: TRANSESOPHAGEAL ECHOCARDIOGRAM (TEE);  Surgeon: Acie Fredrickson Wonda Cheng, MD;  Location: [facility] ENDOSCOPY;  Service: Cardiovascular;  Laterality: N/A;   TOTAL KNEE ARTHROPLASTY Right 03/21/2017   TOTAL KNEE ARTHROPLASTY Right 03/21/2017   Procedure: RIGHT TOTAL KNEE ARTHROPLASTY;  Surgeon: [name], MD;  Location: [place];  Service: Orthopedics;  Laterality: Right;   TUBAL LIGATION      Her Family History Is Significant  For: Family History  Problem Relation Age of Onset   Hypertension Mother    Heart disease Mother    Diabetes Father    Hypertension Father    Heart disease Father    Stroke Father    Diabetes Maternal Aunt    Diabetes Paternal Grandmother    Hypertension Paternal Grandfather    Stroke Paternal Grandfather    Colon cancer Neg Hx    Esophageal cancer Neg Hx    Rectal cancer Neg Hx    Stomach cancer Neg Hx    Sleep apnea Neg Hx     Her Social History Is Significant For: Social History   Socioeconomic History   Marital status: Married    Spouse name: Not on file   Number of children: 2   Years of education: 12   Highest education level: Not on file  Occupational History   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 She lives with her husband [name]). She has 1 daughter [name]), 1 son [name]) and 2 stepchildren (Gustine). They have 3 dogs ([name], [place], [name]) and 3 cats ([place], [place], [place]). She enjoys watching movies and outdoors activities.Exercise - noneCaffeine - varies depending on the day. May have between 1-4 cups daily.  Rare Soda.    Social Determinants of Health   Financial Resource Strain: Not on file  Food Insecurity: No Food Insecurity (09/27/2021)   Hunger Vital Sign    Worried About Running Out of Food in the Last Year: Never true    Ran Out of Food in the Last Year: Never true  Transportation Needs: No Transportation Needs (09/27/2021)   PRAPARE - Hydrologist (Medical): No    Lack of Transportation (Non-Medical): No  Physical Activity: Not on file  Stress: Not on file  Social Connections: Not on file    Her Allergies Are:  Allergies  Allergen Reactions   Eggs Or Egg-Derived Products Other (See Comments)    GI UPSET, JOINT PAIN, DIFF. SWALLOWING   Etodolac Other (See Comments)    ABD. CRAMPING   Fish-Derived Products Other (See Comments)    GI UPSET, JOINT PAIN, DIFF. SWALLOWING    Omeprazole Other (See Comments)    ABD. CRAMPING   Pineapple Other (See Comments)    GI UPSET, JOINT PAIN, DIFF. SWALLOWING   Gluten Meal Diarrhea    GI upset  :   Her Current Medications Are:  Outpatient Encounter Medications as of 02/28/2022  Medication Sig   Ascorbic Acid (VITAMIN C) 1000 MG tablet Take 1,000 mg by mouth 2 (two) times daily.   aspirin EC 81 MG EC tablet Take 1 tablet (81 mg total) by mouth daily. Swallow whole.   atenolol-chlorthalidone (TENORETIC) 50-25 MG tablet TAKE 1 TABLET DAILY   b complex vitamins  tablet  Take 1 tablet by mouth at bedtime.   budesonide (ENTOCORT EC) 3 MG 24 hr capsule Take 3 capsules (9 mg total) by mouth daily.   Calcium Carbonate-Vitamin D (CALCIUM + D PO) Take 1 tablet by mouth at bedtime.   CHELATED MAGNESIUM PO Take 3 tablets by mouth every morning.   Cholecalciferol (D 5000) 5000 units capsule Take 5,000 Units by mouth 3 (three) times daily.   Coenzyme Q10 (COQ10) 100 MG CAPS Take 100 mg by mouth every evening.    diphenoxylate-atropine (LOMOTIL) 2.5-0.025 MG tablet TAKE 1 TABLET BY MOUTH FOUR TIMES A DAY AS NEEDED FOR DIARRHEA OR LOOSE STOOLS (Patient taking differently: Take 1 tablet by mouth 4 (four) times daily as needed for diarrhea or loose stools.)   Ferrous Sulfate (IRON) 325 (65 Fe) MG TABS Take 1 tablet (325 mg total) by mouth 2 (two) times daily. (Patient taking differently: Take 325 mg by mouth every morning.)   Fiber POWD Take 5 mLs by mouth 2 (two) times daily. Heather's Tummy - mix in water and drink   folic acid (FOLVITE) 627 MCG tablet Take 800 mcg by mouth every evening.    hyoscyamine (LEVSIN SL) 0.125 MG SL tablet Take 1-2 tablets by mouth every 4 hours as needed (Patient taking differently: Take 0.125 mg by mouth 2 (two) times daily as needed (stomach cramps).)   ketotifen (ZADITOR) 0.025 % ophthalmic solution Place 2 drops into both eyes 2 (two) times daily as needed (allergies).   KLOR-CON M20 20 MEQ tablet TAKE 1 TABLET TWICE A DAY   lisinopril (ZESTRIL) 2.5 MG tablet Take 1 tablet (2.5 mg total) by mouth daily.   Melatonin 3 MG TABS Take 3 mg by mouth at bedtime.    methocarbamol (ROBAXIN) 500 MG tablet TAKE 1 TABLET BY MOUTH EVERY 8 HOURS AS NEEDED FOR MUSCLE SPASMS (Patient taking differently: Take 500 mg by mouth every 8 (eight) hours as needed for muscle spasms.)   Multiple Vitamin (MULTIVITAMIN WITH MINERALS) TABS tablet Take 1 tablet by mouth at bedtime.   OVER THE COUNTER MEDICATION Take 5 drops by mouth at bedtime. Lemon Balm   OVER THE  COUNTER MEDICATION Take 1 Dose by mouth at bedtime. Cats Claw liquid daily   OVER THE COUNTER MEDICATION Apply 1 application topically at bedtime. Magnesium Lotion   polyvinyl alcohol (LIQUIFILM TEARS) 1.4 % ophthalmic solution Place 1 drop into both eyes daily as needed for dry eyes.   Probiotic Product (PROBIOTIC DAILY PO) Take 1 capsule by mouth 2 (two) times daily.   simvastatin (ZOCOR) 20 MG tablet TAKE 1 TABLET DAILY   Sodium Fluoride (SODIUM FLUORIDE 5000 PPM) 1.1 % PSTE Place 1 application onto teeth at bedtime.   Zinc Sulfate (ZINC 15 PO) Take 10 drops by mouth at bedtime. Liquid Blend 10 drops equal 15 mg   No facility-administered encounter medications on file as of 02/28/2022.  :  Review of Systems:  Out of a complete 14 point review of systems, all are reviewed and negative with the exception of these symptoms as listed below:  Review of Systems  Neurological:        Pt here for CPAP f/u Pt states her AHI numbers are still high Pt states had a mask fitting last week    ESS:9    Objective:  Neurological Exam  Physical Exam Physical Examination:   Vitals:   02/28/22 1324  BP: 113/76  Pulse: 62    General Examination: The patient is a very pleasant  71 y.o. female in no acute distress. She appears well-developed and well-nourished and well groomed.   HEENT: Normocephalic, atraumatic, pupils are equal, round and reactive to light, corrective eyeglasses in place.  Extraocular tracking is good without limitation to gaze excursion or nystagmus noted. Hearing is grossly intact. Face is symmetric with normal facial animation. Speech is clear with no dysarthria noted. There is no hypophonia. There is no lip, neck/head, jaw or voice tremor. Neck is supple with full range of passive and active motion. There are no carotid bruits on auscultation. Oropharynx exam reveals: mild mouth dryness, adequate dental hygiene and moderate airway crowding. Tongue protrudes centrally and palate  elevates symmetrically.    Chest: Clear to auscultation without wheezing, rhonchi or crackles noted.   Heart: S1+S2+0, regular and normal without murmurs, rubs or gallops noted.    Abdomen: Soft, non-tender and non-distended.   Extremities: There is non pitting swelling of both lower extremities.    Skin: Warm and dry without trophic changes noted.    Musculoskeletal: exam reveals no obvious joint deformities.    Neurologically:  Mental status: The patient is awake, alert and oriented in all 4 spheres. Her immediate and remote memory, attention, language skills and fund of knowledge are appropriate. There is no evidence of aphasia, agnosia, apraxia or anomia. Speech is clear with normal prosody and enunciation. Thought process is linear. Mood is normal and affect is normal.  Cranial nerves II - XII are as described above under HEENT exam.  Motor exam: Normal bulk, strength and tone is noted. There is no obvious tremor.  Fine motor skills and coordination: grossly intact.  Cerebellar testing: No dysmetria or intention tremor. There is no truncal or gait ataxia.  Sensory exam: intact to light touch in the upper and lower extremities.  Gait, station and balance: She stands easily. No veering to one side is noted. No leaning to one side is noted. Posture is age-appropriate and stance is narrow based. Gait shows normal stride length and normal pace. No problems turning are noted.    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  At the last appointment we increased her AutoPap range from 6 to 12 cm to 6 to 15 cm.  Her AHI has come down, leak is slightly improved after a mask change.  She is compliant with treatment, we mutually agreed to try to push her maximum pressure to 16 cm at this time from currently 15 cm.  She is commended for her treatment adherence.  We can review her download remotely in about a month from now.  She is tolerating the nasal interface and the pressure thus far.  She is advised to follow-up routinely to see Frann Rider, NP in sleep clinic in about a year.  I answered all her questions today and she was in agreement.   I spent 30 minutes in total face-to-face time and in reviewing records during pre-charting, more than 50% of which was spent in counseling and  coordination of care, reviewing test results, reviewing medications and treatment regimen and/or in discussing or reviewing the diagnosis of OSA, the prognosis and treatment options. Pertinent laboratory and imaging test results that were available during this visit with the patient were reviewed by me and considered in my medical decision making (see chart for details).

## 2022-02-28 NOTE — Patient Instructions (Signed)
It was nice to see you again today.   You are fully compliant with your autoPAP, keep up the good work! Your events have come down since the pressure was increased. As discussed, we will increase your maximum pressure a little more, to 16 cm from 15 cm.   Please continue using your autoPAP regularly. While your insurance requires that you use PAP at least 4 hours each night on 70% of the nights, I recommend, that you not skip any nights and use it throughout the night if you can. Getting used to PAP and staying with the treatment long term does take time and patience and discipline. Untreated obstructive sleep apnea when it is moderate to severe can have an adverse impact on cardiovascular health and raise her risk for heart disease, arrhythmias, hypertension, congestive heart failure, stroke and diabetes. Untreated obstructive sleep apnea causes sleep disruption, nonrestorative sleep, and sleep deprivation. This can have an impact on your day to day functioning and cause daytime sleepiness and impairment of cognitive function, memory loss, mood disturbance, and problems focussing. Using PAP regularly can improve these symptoms.  We can see you in 1 year, you can see Frann Rider, NP.   I would be happy to look at another autoPAP report a month from now; you can send [place] a message on MyChart for this.

## 2022-03-25 ENCOUNTER — Ambulatory Visit (INDEPENDENT_AMBULATORY_CARE_PROVIDER_SITE_OTHER): Payer: Medicare Other | Admitting: Internal Medicine

## 2022-03-25 ENCOUNTER — Encounter: Payer: Self-pay | Admitting: Internal Medicine

## 2022-03-25 VITALS — BP 120/80 | HR 64 | Temp 97.6°F | Ht 62.0 in | Wt 166.0 lb

## 2022-03-25 DIAGNOSIS — E782 Mixed hyperlipidemia: Secondary | ICD-10-CM | POA: Diagnosis not present

## 2022-03-25 DIAGNOSIS — I10 Essential (primary) hypertension: Secondary | ICD-10-CM | POA: Diagnosis not present

## 2022-03-25 DIAGNOSIS — N393 Stress incontinence (female) (male): Secondary | ICD-10-CM

## 2022-03-25 DIAGNOSIS — R32 Unspecified urinary incontinence: Secondary | ICD-10-CM | POA: Insufficient documentation

## 2022-03-25 LAB — CBC
HCT: 37.1 % (ref 36.0–46.0)
Hemoglobin: 11.9 g/dL — ABNORMAL LOW (ref 12.0–15.0)
MCHC: 32.1 g/dL (ref 30.0–36.0)
MCV: 79.2 fl (ref 78.0–100.0)
Platelets: 237 10*3/uL (ref 150.0–400.0)
RBC: 4.69 Mil/uL (ref 3.87–5.11)
RDW: 19.5 % — ABNORMAL HIGH (ref 11.5–15.5)
WBC: 10.2 10*3/uL (ref 4.0–10.5)

## 2022-03-25 LAB — COMPREHENSIVE METABOLIC PANEL
ALT: 24 U/L (ref 0–35)
AST: 33 U/L (ref 0–37)
Albumin: 3.9 g/dL (ref 3.5–5.2)
Alkaline Phosphatase: 49 U/L (ref 39–117)
BUN: 16 mg/dL (ref 6–23)
CO2: 31 mEq/L (ref 19–32)
Calcium: 9.4 mg/dL (ref 8.4–10.5)
Chloride: 104 mEq/L (ref 96–112)
Creatinine, Ser: 0.87 mg/dL (ref 0.40–1.20)
GFR: 66.88 mL/min (ref >60.00)
Glucose, Bld: 88 mg/dL (ref 70–99)
Potassium: 3.7 mEq/L (ref 3.5–5.1)
Sodium: 142 mEq/L (ref 135–145)
Total Bilirubin: 0.5 mg/dL (ref 0.2–1.2)
Total Protein: 6.5 g/dL (ref 6.0–8.3)

## 2022-03-25 LAB — LIPID PANEL
Cholesterol: 148 mg/dL (ref 0–200)
HDL: 58.3 mg/dL (ref >39.00)
LDL Cholesterol: 75 mg/dL (ref 0–99)
NonHDL: 89.24
Total CHOL/HDL Ratio: 3
Triglycerides: 71 mg/dL (ref 0.0–149.0)
VLDL: 14.2 mg/dL (ref 0.0–40.0)

## 2022-03-25 LAB — MAGNESIUM: Magnesium: 2 mg/dL (ref 1.5–2.5)

## 2022-03-25 LAB — VITAMIN B12: Vitamin B-12: 693 pg/mL (ref 211–911)

## 2022-03-25 MED ORDER — MIRABEGRON ER 50 MG PO TB24: 50.0000 mg | ORAL_TABLET | Freq: Every day | ORAL | 1 refills | Status: DC

## 2022-03-25 NOTE — Assessment & Plan Note (Signed)
BP at goal, checking CBC and CMP and adjust lisinopril 2.5 mg daily and atenolol/chlorthalidone 50/25 mg daily as needed.

## 2022-03-25 NOTE — Progress Notes (Signed)
   Subjective:   Patient ID: [name], female    DOB: [birth year], 71 y.o.   MRN: [id number]  Muscle Pain Associated symptoms include diarrhea. Pertinent negatives include no abdominal pain, chest pain, constipation, nausea, shortness of breath or vomiting.   The patient is a 71 YO female coming in for follow up.   Review of Systems  Constitutional: Negative.   HENT: Negative.    Eyes: Negative.   Respiratory:  Negative for cough, chest tightness and shortness of breath.   Cardiovascular:  Negative for chest pain, palpitations and leg swelling.  Gastrointestinal:  Positive for diarrhea. Negative for abdominal distention, abdominal pain, constipation, nausea and vomiting.  Genitourinary:  Positive for enuresis.       Urinary incontinence  Musculoskeletal:  Positive for arthralgias and myalgias.  Skin: Negative.   Neurological: Negative.   Psychiatric/Behavioral: Negative.      Objective:  Physical Exam Constitutional:      Appearance: She is well-developed.  HENT:     Head: Normocephalic and atraumatic.  Cardiovascular:     Rate and Rhythm: Normal rate and regular rhythm.  Pulmonary:     Effort: Pulmonary effort is normal. No respiratory distress.     Breath sounds: Normal breath sounds. No wheezing or rales.  Abdominal:     General: Bowel sounds are normal. There is no distension.     Palpations: Abdomen is soft.     Tenderness: There is no abdominal tenderness. There is no rebound.  Musculoskeletal:     Cervical back: Normal range of motion.  Skin:    General: Skin is warm and dry.  Neurological:     Mental Status: She is alert and oriented to person, place, and time.     Coordination: Coordination normal.     Vitals:   03/25/22 1042  BP: 120/80  Pulse: 64  Temp: 97.6 F (36.4 C)  TempSrc: Oral  SpO2: 97%  Weight: 166 lb (75.3 kg)  Height: 5' 2"  (1.575 m)    Assessment & Plan:

## 2022-03-25 NOTE — Assessment & Plan Note (Signed)
She is taking chlorthalidone which can contribute. Discussed pelvic floor therapy. She wishes to try mybretriq 50 mg daily first. Rx done today. Worsening over time.

## 2022-03-25 NOTE — Assessment & Plan Note (Signed)
Checking lipid panel and adjust simvastatin 20 mg daily as needed.

## 2022-03-25 NOTE — Patient Instructions (Addendum)
We will check the labs today.  We have sent in the myrbetriq to take daily to help the bladder.

## 2022-03-28 ENCOUNTER — Other Ambulatory Visit: Payer: Self-pay | Admitting: Internal Medicine

## 2022-03-29 ENCOUNTER — Telehealth: Payer: Medicare Other

## 2022-03-31 ENCOUNTER — Ambulatory Visit: Payer: Medicare Other | Admitting: Emergency Medicine

## 2022-04-02 DIAGNOSIS — J069 Acute upper respiratory infection, unspecified: Secondary | ICD-10-CM | POA: Diagnosis not present

## 2022-04-02 DIAGNOSIS — Z20822 Contact with and (suspected) exposure to covid-19: Secondary | ICD-10-CM | POA: Diagnosis not present

## 2022-04-02 DIAGNOSIS — J205 Acute bronchitis due to respiratory syncytial virus: Secondary | ICD-10-CM | POA: Diagnosis not present

## 2022-04-07 ENCOUNTER — Other Ambulatory Visit: Payer: Self-pay | Admitting: Nurse Practitioner

## 2022-04-07 ENCOUNTER — Ambulatory Visit (INDEPENDENT_AMBULATORY_CARE_PROVIDER_SITE_OTHER): Payer: Medicare Other | Admitting: Nurse Practitioner

## 2022-04-07 VITALS — BP 140/82 | HR 96 | Temp 99.4°F | Ht 62.0 in | Wt 163.5 lb

## 2022-04-07 DIAGNOSIS — B9789 Other viral agents as the cause of diseases classified elsewhere: Secondary | ICD-10-CM | POA: Diagnosis not present

## 2022-04-07 DIAGNOSIS — J988 Other specified respiratory disorders: Secondary | ICD-10-CM | POA: Diagnosis not present

## 2022-04-07 LAB — POCT RESPIRATORY SYNCYTIAL VIRUS: RSV Rapid Ag: NEGATIVE

## 2022-04-07 LAB — POCT INFLUENZA A/B
Influenza A, POC: NEGATIVE
Influenza B, POC: NEGATIVE

## 2022-04-07 LAB — POC COVID19 BINAXNOW: SARS Coronavirus 2 Ag: NEGATIVE

## 2022-04-07 MED ORDER — METHYLPREDNISOLONE ACETATE 40 MG/ML IJ SUSP
40.0000 mg | Freq: Once | INTRAMUSCULAR | Status: AC
  Administered 2022-04-07: 40 mg via INTRAMUSCULAR

## 2022-04-07 MED ORDER — PREDNISONE 20 MG PO TABS: 20.0000 mg | ORAL_TABLET | Freq: Every day | ORAL | 0 refills | Status: DC

## 2022-04-07 MED ORDER — ALBUTEROL SULFATE HFA 108 (90 BASE) MCG/ACT IN AERS: 2.0000 | INHALATION_SPRAY | Freq: Four times a day (QID) | RESPIRATORY_TRACT | 0 refills | Status: DC | PRN

## 2022-04-07 NOTE — Progress Notes (Signed)
Established Patient Office Visit  Subjective   Patient ID: [name], female    DOB: 06-Sep-1950  Age: 71 y.o. MRN: [id number]  Chief Complaint  Patient presents with   RSV    Symptom onset 11 days ago. Went to an urgent care about 1 week ago and was found positive for RSV. She reports having been prescribed amoxicillin (has 4 more days to go on this medication). Reports that she continues to have congestion and cough. Had one episode of shortness of breath. She wanted evaluation in case there was other medications she can take to treat her symptoms.  Aching Tessalon Perles and Mucinex as needed.    Review of Systems  Constitutional:  Positive for malaise/fatigue. Negative for fever.  HENT:  Positive for congestion. Negative for sore throat.   Respiratory:  Positive for cough. Negative for shortness of breath.       Objective:     BP (!) 140/82   Pulse 96   Temp 99.4 F (37.4 C) (Oral)   Ht 5' 2"  (1.575 m)   Wt 163 lb 8 oz (74.2 kg)   LMP 02/09/2005   SpO2 97%   BMI 29.90 kg/m  BP Readings from Last 3 Encounters:  04/07/22 (!) 140/82  03/25/22 120/80  02/28/22 113/76   Wt Readings from Last 3 Encounters:  04/07/22 163 lb 8 oz (74.2 kg)  03/25/22 166 lb (75.3 kg)  02/28/22 170 lb (77.1 kg)      Physical Exam Vitals reviewed.  Constitutional:      General: She is not in acute distress.    Appearance: Normal appearance.  HENT:     Head: Normocephalic and atraumatic.  Neck:     Vascular: No carotid bruit.  Cardiovascular:     Rate and Rhythm: Normal rate and regular rhythm.     Pulses: Normal pulses.     Heart sounds: Normal heart sounds.  Pulmonary:     Effort: Pulmonary effort is normal.     Breath sounds: Examination of the right-upper field reveals wheezing. Examination of the left-upper field reveals wheezing. Examination of the right-lower field reveals wheezing. Examination of the left-lower field reveals wheezing. Wheezing present.  Skin:     General: Skin is warm and dry.  Neurological:     General: No focal deficit present.     Mental Status: She is alert and oriented to person, place, and time.  Psychiatric:        Mood and Affect: Mood normal.        Behavior: Behavior normal.        Judgment: Judgment normal.      Results for orders placed or performed in visit on 04/07/22  POC COVID-19  Result Value Ref Range   SARS Coronavirus 2 Ag Negative Negative  POCT respiratory syncytial virus  Result Value Ref Range   RSV Rapid Ag negative   POCT Influenza A/B  Result Value Ref Range   Influenza A, POC Negative Negative   Influenza B, POC Negative Negative      The ASCVD Risk score (Arnett DK, et al., 2019) failed to calculate for the following reasons:   The patient has a prior MI or stroke diagnosis    Assessment & Plan:   Problem List Items Addressed This Visit       Respiratory   Viral respiratory illness - Primary    Acute, point-of-care RSV testing today is negative.  No crackles noted on exam but she does  have wheezing throughout lung fields.  Pulse oximetry reading 97% on room air.  Other vital signs stable, initially elevated but did improve after resting.  Patient encouraged to continue taking her course of amoxicillin, benzonatate Perles, over-the-counter Mucinex, will also prescribe albuterol inhaler that she can take as needed for wheezing or shortness of breath.  Will give one-time injection of Solu-Medrol 40 mg, prescribed prednisone 20 mg by mouth daily every morning for 5 days.  Patient educated to look for signs symptoms of bleeding in stool and if this were to occur to stop the prednisone and call our office or call 911.  Patient told to take no NSAIDs and only Tylenol if needed for pain management.  Patient reports understanding.  Patient told to call our office for further evaluation if symptoms persist for an additional week.        Relevant Medications   albuterol (VENTOLIN HFA) 108 (90 Base)  MCG/ACT inhaler   predniSONE (DELTASONE) 20 MG tablet   Other Relevant Orders   POC COVID-19 (Completed)   POCT respiratory syncytial virus (Completed)   POCT Influenza A/B (Completed)    Return if symptoms worsen or fail to improve.    Ailene Ards, NP

## 2022-04-07 NOTE — Patient Instructions (Signed)
Call if symptoms persist or worsen past 7 days.   Call 911 if you experience shortness of breath.  Stop prednisone if you seen blood or dark stools.  Take ONLY tylenol for pain management if needed while on prednisone. Avoid all NSAID.

## 2022-04-07 NOTE — Assessment & Plan Note (Signed)
Acute, point-of-care RSV testing today is negative.  No crackles noted on exam but she does have wheezing throughout lung fields.  Pulse oximetry reading 97% on room air.  Other vital signs stable, initially elevated but did improve after resting.  Patient encouraged to continue taking her course of amoxicillin, benzonatate Perles, over-the-counter Mucinex, will also prescribe albuterol inhaler that she can take as needed for wheezing or shortness of breath.  Will give one-time injection of Solu-Medrol 40 mg, prescribed prednisone 20 mg by mouth daily every morning for 5 days.  Patient educated to look for signs symptoms of bleeding in stool and if this were to occur to stop the prednisone and call our office or call 911.  Patient told to take no NSAIDs and only Tylenol if needed for pain management.  Patient reports understanding.  Patient told to call our office for further evaluation if symptoms persist for an additional week.

## 2022-04-08 ENCOUNTER — Telehealth: Payer: Self-pay

## 2022-04-08 NOTE — Telephone Encounter (Signed)
Pt has states the pharmacy has sent in a fax regarding the albuterol (VENTOLIN HFA) 108 (90 Base) MCG/ACT inhaler  that was sent in yesterday.

## 2022-04-21 NOTE — Telephone Encounter (Signed)
Medication was send in and pt still have refills left

## 2022-04-27 DIAGNOSIS — L821 Other seborrheic keratosis: Secondary | ICD-10-CM | POA: Diagnosis not present

## 2022-04-27 DIAGNOSIS — D485 Neoplasm of uncertain behavior of skin: Secondary | ICD-10-CM | POA: Diagnosis not present

## 2022-04-27 DIAGNOSIS — L814 Other melanin hyperpigmentation: Secondary | ICD-10-CM | POA: Diagnosis not present

## 2022-04-27 DIAGNOSIS — B079 Viral wart, unspecified: Secondary | ICD-10-CM | POA: Diagnosis not present

## 2022-04-27 DIAGNOSIS — D225 Melanocytic nevi of trunk: Secondary | ICD-10-CM | POA: Diagnosis not present

## 2022-05-03 ENCOUNTER — Other Ambulatory Visit: Payer: Self-pay | Admitting: Gastroenterology

## 2022-05-05 ENCOUNTER — Encounter: Payer: Self-pay | Admitting: Internal Medicine

## 2022-05-12 DIAGNOSIS — Z1231 Encounter for screening mammogram for malignant neoplasm of breast: Secondary | ICD-10-CM | POA: Diagnosis not present

## 2022-05-12 LAB — HM MAMMOGRAPHY

## 2022-05-16 ENCOUNTER — Other Ambulatory Visit: Payer: Self-pay | Admitting: *Deleted

## 2022-05-16 MED ORDER — ATENOLOL-CHLORTHALIDONE 50-25 MG PO TABS: 1.0000 | ORAL_TABLET | Freq: Every day | ORAL | 1 refills | Status: DC

## 2022-05-16 MED ORDER — SIMVASTATIN 20 MG PO TABS: 20.0000 mg | ORAL_TABLET | Freq: Every day | ORAL | 1 refills | Status: DC

## 2022-05-16 NOTE — Telephone Encounter (Signed)
Prescription Request  05/16/2022  Is this a "Controlled Substance" medicine? No  LOV: 03/2022  What is the name of the medication or equipment? Simvastatin and atenolol  Have you contacted your pharmacy to request a refill? Yes   Which pharmacy would you like this sent to?    Patient notified that their request is being sent to the clinical staff for review and that they should receive a response within 2 business days.   Please advise at  not needed  Sent to pharm since last seen in [facility] with f/u planned in 09/2022

## 2022-05-17 ENCOUNTER — Encounter: Payer: Self-pay | Admitting: Internal Medicine

## 2022-07-14 ENCOUNTER — Telehealth: Payer: Self-pay | Admitting: Internal Medicine

## 2022-07-14 MED ORDER — LISINOPRIL 2.5 MG PO TABS: 2.5000 mg | ORAL_TABLET | Freq: Every day | ORAL | 0 refills | Status: DC

## 2022-07-14 NOTE — Telephone Encounter (Signed)
Prescription Request  07/14/2022  LOV: 03/25/2022  What is the name of the medication or equipment? Lisinopril 2.5  Have you contacted your pharmacy to request a refill? Yes   Which pharmacy would you like this sent to?   [facility] [place] [facility] [address] [place] [address] Phone: [phone] Fax: [phone]    Patient notified that their request is being sent to the clinical staff for review and that they should receive a response within 2 business days.   Please advise at Mobile [phone] (mobile)

## 2022-07-14 NOTE — Telephone Encounter (Signed)
Pt is due for yearly in June. Sent 90 day until appt.Marland KitchenJohny Haley

## 2022-07-27 ENCOUNTER — Encounter: Payer: Self-pay | Admitting: Orthopedic Surgery

## 2022-07-27 ENCOUNTER — Ambulatory Visit (INDEPENDENT_AMBULATORY_CARE_PROVIDER_SITE_OTHER): Payer: Medicare Other | Admitting: Orthopedic Surgery

## 2022-07-27 DIAGNOSIS — M542 Cervicalgia: Secondary | ICD-10-CM | POA: Diagnosis not present

## 2022-07-27 NOTE — Progress Notes (Signed)
Office Visit Note   Patient: [name]           Date of Birth: April 16, 1950           MRN: [id number] Visit Date: 07/27/2022 Requested by: Myrlene Broker, MD [address] [place],  [place] [address] PCP: Myrlene Broker, MD  Subjective: Chief Complaint  Patient presents with   Neck - Pain    HPI: [name] is a 72 y.o. female who presents to the office reporting upper thoracic spine pain.  Patient describes pain between her scapula as well as neck pain for 6 weeks.  The pain flares up and then gets better.  Overall doing a little bit better now.  She has been going to the chiropractor for massage therapy.  Describes pain radiating underneath her axilla but she denies any decrease in right shoulder range of motion.  Dr. Alvester Morin did a cervical spine ESI in 2022.  She did change pillows.  Chiropractor is concerned about frozen shoulder.  She has a history of a torn rotator cuff on the right-hand side.  Cervical spine MRI from 2020 shows spondylosis at multiple levels..                ROS: All systems reviewed are negative as they relate to the chief complaint within the history of present illness.  Patient denies fevers or chills.  Assessment & Plan: Visit Diagnoses:  1. Cervicalgia     Plan: Impression is neck pain and upper thoracic spine pain likely coming from cervical spine pathology.  I do not think the shoulder is at play is much as the neck.  Plan MRI cervical spine since the old one is about 4 years out.  I think her spondylosis is likely progressed.  Whether or not it is an operative problem it is hard to say.  Follow-up after that study.  Follow-Up Instructions: No follow-ups on file.   Orders:  Orders Placed This Encounter  Procedures   MR Cervical Spine w/o contrast   No orders of the defined types were placed in this encounter.     Procedures: No procedures performed   Clinical Data: No additional findings.  Objective: Vital Signs:  LMP 02/09/2005   Physical Exam:  Constitutional: Patient appears well-developed HEENT:  Head: Normocephalic Eyes:EOM are normal Neck: Normal range of motion Cardiovascular: Normal rate Pulmonary/chest: Effort normal Neurologic: Patient is alert Skin: Skin is warm Psychiatric: Patient has normal mood and affect  Ortho Exam: Ortho exam demonstrates mild pain with range of motion of both shoulders.  A little bit of crepitus more on the right than the left.  No evidence of frozen shoulder.  Cervical spine range of motion flexion chin to chest extension about 30 degrees rotation is about 50 degrees bilaterally.  No scapular dyskinesia with forward flexion.  No definite paresthesias C5-T1.  Reflexes symmetric bilateral biceps and triceps.  Specialty Comments:  No specialty comments available.  Imaging: No results found.   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  chronic back pain   Cataract    Chronic back pain    arthritis    Diverticulosis    Elevated cholesterol    takes Niacin daily and Simvastatin   GERD (gastroesophageal reflux disease) 06/2004   non-specific gastritis on EGD 06/2004   Headache(784.0)    occasionally   History of colon polyps 2004, 2009    2004:adenomatous. 2009 hyperplastic.    History of hiatal hernia    Hypertension    takes Tenoretic and Lisinopril daily   Mucoid cyst of joint 08/2013   right index finger   Osteopenia 01/2014   T score -1.1 FRAX 14%/0.5%. Stable from prior DEXA   PONV (postoperative nausea and vomiting)    Rotator cuff arthropathy    Left   Seasonal allergies    Stroke 1998   x 2 - mild left-sided weakness   Urge incontinence    Uterine prolapse     Family History  Problem Relation Age of Onset   Hypertension Mother    Heart disease Mother    Diabetes Father    Hypertension Father    Heart disease Father    Stroke Father    Diabetes Maternal Aunt    Diabetes Paternal Grandmother    Hypertension Paternal Grandfather    Stroke Paternal Grandfather    Colon cancer Neg Hx    Esophageal cancer Neg Hx    Rectal cancer Neg Hx    Stomach cancer Neg Hx    Sleep apnea Neg Hx     Past Surgical History:  Procedure Laterality Date   BACK SURGERY     x 2   BELPHAROPTOSIS REPAIR Bilateral 12/14/2017   BUBBLE STUDY  02/16/2021   Procedure: BUBBLE STUDY;  Surgeon: Vesta Mixer, MD;  Location: [facility] ENDOSCOPY;  Service: Cardiovascular;;   CATARACT EXTRACTION     CATARACT EXTRACTION Bilateral 10/2017   COLONOSCOPY  2004, 2009, 2014   FINGER SURGERY     GUM SURGERY     GYNECOLOGIC CRYOSURGERY     HYSTEROSCOPY WITH D & C  12/07/2010   with resection of endometrial polyp   JOINT REPLACEMENT     KNEE ARTHROSCOPY Left 2008   lip biopsy     done at MD office Fri 11/22/13   MASS EXCISION Right 08/15/2013   Procedure: RIGHT INDEX EXCISION MASS ;  Surgeon: Tami Ribas, MD;  Location: [facility];  Service: Orthopedics;  Laterality: Right;   NASAL SEPTUM SURGERY     OOPHORECTOMY Right 2000   REVERSE SHOULDER ARTHROPLASTY Left 12/11/2018   REVERSE SHOULDER ARTHROPLASTY Left 12/11/2018   Procedure: LEFT REVERSE SHOULDER ARTHROPLASTY;  Surgeon: Cammy Copa, MD;  Location: [facility] OR;   Service: Orthopedics;  Laterality: Left;   SHOULDER ARTHROSCOPY WITH OPEN ROTATOR CUFF REPAIR AND DISTAL CLAVICLE ACROMINECTOMY Right 11/26/2013   Procedure: RIGHT SHOULDER ARTHROSCOPY WITH MINI OPEN ROTATOR CUFF REPAIR AND DISTAL CLAVICLE RESECTION, SUBACROMIAL DECOMPRESSION, POSSIBLE [facility] PATCH.;  Surgeon: Valeria Batman, MD;  Location: [facility] OR;  Service: Orthopedics;  Laterality: Right;   TEE WITHOUT CARDIOVERSION N/A 02/16/2021   Procedure: TRANSESOPHAGEAL ECHOCARDIOGRAM (TEE);  Surgeon: Elease Hashimoto Deloris Ping, MD;  Location: [facility] ENDOSCOPY;  Service: Cardiovascular;  Laterality: N/A;   TOTAL KNEE ARTHROPLASTY Right 03/21/2017  TOTAL KNEE ARTHROPLASTY Right 03/21/2017   Procedure: RIGHT TOTAL KNEE ARTHROPLASTY;  Surgeon: Valeria Batman, MD;  Location: [facility] OR;  Service: Orthopedics;  Laterality: Right;   TUBAL LIGATION     Social History   Occupational History   Occupation: Forensic scientist - Human Resources-Retired    Comment: Disability/Retired  Tobacco Use   Smoking status: Never    Passive exposure: Yes   Smokeless tobacco: Never  Vaping Use   Vaping Use: Never used  Substance and Sexual Activity   Alcohol use: Not Currently    Alcohol/week: 0.0 standard drinks of alcohol    Comment: about 2-3 times a year   Drug use: No   Sexual activity: Yes    Partners: Male    Birth control/protection: Surgical, Post-menopausal    Comment: BTL-1st intercourse 72 yo-More than 5 partners

## 2022-08-12 ENCOUNTER — Ambulatory Visit
Admission: RE | Admit: 2022-08-12 | Discharge: 2022-08-12 | Disposition: A | Discharge status: Home / Self Care | Payer: Medicare Other | Source: Ambulatory Visit | Attending: Orthopedic Surgery | Admitting: Orthopedic Surgery

## 2022-08-12 DIAGNOSIS — M542 Cervicalgia: Secondary | ICD-10-CM

## 2022-08-12 DIAGNOSIS — M25519 Pain in unspecified shoulder: Secondary | ICD-10-CM | POA: Diagnosis not present

## 2022-08-17 ENCOUNTER — Ambulatory Visit (INDEPENDENT_AMBULATORY_CARE_PROVIDER_SITE_OTHER): Payer: Medicare Other | Admitting: Orthopedic Surgery

## 2022-08-17 ENCOUNTER — Encounter: Payer: Self-pay | Admitting: Orthopedic Surgery

## 2022-08-17 DIAGNOSIS — M542 Cervicalgia: Secondary | ICD-10-CM | POA: Diagnosis not present

## 2022-08-17 NOTE — Progress Notes (Signed)
Office Visit Note   Patient: [name]           Date of Birth: [birth year]           MRN: [id number] Visit Date: 08/17/2022 Requested by: Myrlene Broker, MD [address] [place],  [place] [address] PCP: Myrlene Broker, MD  Subjective: Chief Complaint  Patient presents with   Neck - Follow-up    HPI: [name] is a 72 y.o. female who presents to the office reporting improved neck pain.  Since she was last seen she has had an MRI scan.  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  She did have 2 shots with Dr. Alvester Morin last year but then had a stroke in November.  Overall she is doing better than she was a month ago.  She is reporting increased strength..                ROS: All systems reviewed are negative as they relate to the chief complaint within the history of present illness.  Patient denies fevers or chills.  Assessment & Plan: Visit Diagnoses: No diagnosis found.  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  Not critical yet and she has no myelopathic signs today.  Plan is observation for now.  If she becomes more symptomatic I think repeat injections would be indicated.  Follow-Up Instructions: No follow-ups on file.   Orders:  No orders of the defined types were placed in this encounter.  No orders of the defined types were placed in this encounter.     Procedures: No procedures performed   Clinical Data: No additional findings.  Objective: Vital Signs: LMP 02/09/2005   Physical Exam:  Constitutional: Patient appears well-developed HEENT:  Head: Normocephalic Eyes:EOM are normal Neck: Normal range of motion Cardiovascular: Normal rate Pulmonary/chest: Effort normal Neurologic: Patient is alert Skin: Skin is warm Psychiatric: Patient has normal mood and affect  Ortho Exam: Ortho exam demonstrates normal gait and alignment.  She has 5  out of 5 grip EPL FPL interosseous wrist flexion extension bicep triceps and deltoid strength.  Neck range of motion intact.  No definite paresthesias C5-T1.  Negative Hoffman's reflex.  No real weakness or abnormalities of gait.  Specialty Comments:  No specialty comments available.  Imaging: No results found.   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  chronic back pain   Cataract    Chronic back pain    arthritis    Diverticulosis    Elevated cholesterol    takes Niacin daily and Simvastatin   GERD (gastroesophageal reflux disease) 06/2004   non-specific gastritis on EGD 06/2004   Headache(784.0)    occasionally   History  of colon polyps 2004, 2009   2004:adenomatous. 2009 hyperplastic.    History of hiatal hernia    Hypertension    takes Tenoretic and Lisinopril daily   Mucoid cyst of joint 08/2013   right index finger   Osteopenia 01/2014   T score -1.1 FRAX 14%/0.5%. Stable from prior DEXA   PONV (postoperative nausea and vomiting)    Rotator cuff arthropathy    Left   Seasonal allergies    Stroke (HCC) 1998   x 2 - mild left-sided weakness   Urge incontinence    Uterine prolapse     Family History  Problem Relation Age of Onset    Hypertension Mother    Heart disease Mother    Diabetes Father    Hypertension Father    Heart disease Father    Stroke Father    Diabetes Maternal Aunt    Diabetes Paternal Grandmother    Hypertension Paternal Grandfather    Stroke Paternal Grandfather    Colon cancer Neg Hx    Esophageal cancer Neg Hx    Rectal cancer Neg Hx    Stomach cancer Neg Hx    Sleep apnea Neg Hx     Past Surgical History:  Procedure Laterality Date   BACK SURGERY     x 2   BELPHAROPTOSIS REPAIR Bilateral 12/14/2017   BUBBLE STUDY  02/16/2021   Procedure: BUBBLE STUDY;  Surgeon: Vesta Mixer, MD;  Location: [facility] ENDOSCOPY;  Service: Cardiovascular;;   CATARACT EXTRACTION     CATARACT EXTRACTION Bilateral 10/2017   COLONOSCOPY  2004, 2009, 2014   FINGER SURGERY     GUM SURGERY     GYNECOLOGIC CRYOSURGERY     HYSTEROSCOPY WITH D & C  12/07/2010   with resection of endometrial polyp   JOINT REPLACEMENT     KNEE ARTHROSCOPY Left 2008   lip biopsy     done at MD office Fri 11/22/13   MASS EXCISION Right 08/15/2013   Procedure: RIGHT INDEX EXCISION MASS ;  Surgeon: Tami Ribas, MD;  Location:  SURGERY CENTER;  Service: Orthopedics;  Laterality: Right;   NASAL SEPTUM SURGERY     OOPHORECTOMY Right 2000   REVERSE SHOULDER ARTHROPLASTY Left 12/11/2018   REVERSE SHOULDER ARTHROPLASTY Left 12/11/2018   Procedure: LEFT REVERSE SHOULDER ARTHROPLASTY;  Surgeon: Cammy Copa, MD;  Location: [facility] OR;  Service: Orthopedics;  Laterality: Left;   SHOULDER ARTHROSCOPY WITH OPEN ROTATOR CUFF REPAIR AND DISTAL CLAVICLE ACROMINECTOMY Right 11/26/2013   Procedure: RIGHT SHOULDER ARTHROSCOPY WITH MINI OPEN ROTATOR CUFF REPAIR AND DISTAL CLAVICLE RESECTION, SUBACROMIAL DECOMPRESSION, POSSIBLE [facility] PATCH.;  Surgeon: Valeria Batman, MD;  Location: [facility] OR;  Service: Orthopedics;  Laterality: Right;   TEE WITHOUT CARDIOVERSION N/A 02/16/2021   Procedure: TRANSESOPHAGEAL ECHOCARDIOGRAM (TEE);  Surgeon:  Elease Hashimoto Deloris Ping, MD;  Location: [facility] ENDOSCOPY;  Service: Cardiovascular;  Laterality: N/A;   TOTAL KNEE ARTHROPLASTY Right 03/21/2017   TOTAL KNEE ARTHROPLASTY Right 03/21/2017   Procedure: RIGHT TOTAL KNEE ARTHROPLASTY;  Surgeon: Valeria Batman, MD;  Location: [facility] OR;  Service: Orthopedics;  Laterality: Right;   TUBAL LIGATION     Social History   Occupational History   Occupation: Forensic scientist - Human Resources-Retired    Comment: Disability/Retired  Tobacco Use   Smoking status: Never    Passive exposure: Yes   Smokeless tobacco: Never  Vaping Use   Vaping Use: Never used  Substance and Sexual Activity   Alcohol use: Not Currently  Alcohol/week: 0.0 standard drinks of alcohol    Comment: about 2-3 times a year   Drug use: No   Sexual activity: Yes    Partners: Male    Birth control/protection: Surgical, Post-menopausal    Comment: BTL-1st intercourse 72 yo-More than 5 partners

## 2022-09-10 ENCOUNTER — Other Ambulatory Visit: Payer: Self-pay | Admitting: Gastroenterology

## 2022-09-22 ENCOUNTER — Ambulatory Visit (INDEPENDENT_AMBULATORY_CARE_PROVIDER_SITE_OTHER): Payer: Medicare Other | Admitting: Internal Medicine

## 2022-09-22 VITALS — BP 114/60 | HR 69 | Temp 97.9°F | Ht 62.0 in | Wt 167.0 lb

## 2022-09-22 DIAGNOSIS — I700 Atherosclerosis of aorta: Secondary | ICD-10-CM

## 2022-09-22 DIAGNOSIS — K50111 Crohn's disease of large intestine with rectal bleeding: Secondary | ICD-10-CM | POA: Diagnosis not present

## 2022-09-22 DIAGNOSIS — I10 Essential (primary) hypertension: Secondary | ICD-10-CM | POA: Diagnosis not present

## 2022-09-22 NOTE — Patient Instructions (Signed)
Stop the lisinopril and monitor the blood pressure and let us know if still running normal.

## 2022-09-22 NOTE — Progress Notes (Signed)
   Subjective:   Patient ID: [name], female    DOB: [birth year], 72 y.o.   MRN: [id number]  HPI The patient is a 72 YO female coming in for follow up.   Review of Systems  Constitutional: Negative.   HENT: Negative.    Eyes: Negative.   Respiratory:  Negative for cough, chest tightness and shortness of breath.   Cardiovascular:  Negative for chest pain, palpitations and leg swelling.  Gastrointestinal:  Negative for abdominal distention, abdominal pain, constipation, diarrhea, nausea and vomiting.  Musculoskeletal:  Positive for arthralgias and myalgias.  Skin: Negative.   Neurological: Negative.   Psychiatric/Behavioral: Negative.      Objective:  Physical Exam Constitutional:      Appearance: She is well-developed.  HENT:     Head: Normocephalic and atraumatic.  Cardiovascular:     Rate and Rhythm: Normal rate and regular rhythm.  Pulmonary:     Effort: Pulmonary effort is normal. No respiratory distress.     Breath sounds: Normal breath sounds. No wheezing or rales.  Abdominal:     General: Bowel sounds are normal. There is no distension.     Palpations: Abdomen is soft.     Tenderness: There is no abdominal tenderness. There is no rebound.  Musculoskeletal:        General: Tenderness present.     Cervical back: Normal range of motion.  Skin:    General: Skin is warm and dry.  Neurological:     Mental Status: She is alert and oriented to person, place, and time.     Coordination: Coordination normal.     Vitals:   09/22/22 1100  BP: 114/60  Pulse: 69  Temp: 97.9 F (36.6 C)  TempSrc: Oral  SpO2: 99%  Weight: 167 lb (75.8 kg)  Height: 5\' 2"  (1.575 m)    Assessment & Plan:

## 2022-09-23 ENCOUNTER — Encounter: Payer: Self-pay | Admitting: Internal Medicine

## 2022-09-23 NOTE — Assessment & Plan Note (Signed)
No bleeding currently and she is still struggling with her bowels with diarrhea and constipation. Taking magnesium liquid and this has helped immensely.

## 2022-09-23 NOTE — Assessment & Plan Note (Signed)
BP is running 90-100/60 at home. Some intermittent dizziness. We will stop lisinopril 2.5 mg daily and keep atenolol/chlorthalidone 50/25 mg daily. If BP still at goal will remove lisinopril off list and she has ability to track BP at home.

## 2022-09-23 NOTE — Assessment & Plan Note (Signed)
Taking simvastatin 20 mg daily and will continue lifelong to reduce risk.

## 2022-09-24 ENCOUNTER — Other Ambulatory Visit: Payer: Self-pay | Admitting: Internal Medicine

## 2022-10-26 ENCOUNTER — Ambulatory Visit: Payer: Medicare Other

## 2022-10-26 DIAGNOSIS — Z0000 Encounter for general adult medical examination without abnormal findings: Secondary | ICD-10-CM

## 2022-10-26 NOTE — Progress Notes (Cosign Needed Addendum)
Subjective:   [name] is a 72 y.o. female who presents for Medicare Annual (Subsequent) preventive examination.  Visit Complete: Virtual  I connected with  [name] on 11/09/22 by a audio enabled telemedicine application and verified that I am speaking with the correct person using two identifiers.  Patient Location: Home  Provider Location: Office/Clinic  I discussed the limitations of evaluation and management by telemedicine. The patient expressed understanding and agreed to proceed.  Patient was unable to self-report due to a lack of equipment at home via telehealth  Review of Systems    Defer to PCP Cardiac Risk Factors include: advanced age (>37men, >67 women);obesity (BMI >30kg/m2);sedentary lifestyle     Objective:    Today's Vitals   10/26/22 1520 11/09/22 0835  PainSc: 8  8    There is no height or weight on file to calculate BMI.     02/25/2021    1:07 PM 02/13/2021    2:26 PM 12/12/2018    8:42 AM 12/07/2018    1:28 PM 02/15/2018    3:20 PM 04/17/2017    1:14 PM 03/21/2017    4:00 PM  Advanced Directives  Does Patient Have a Medical Advance Directive? Yes Yes Yes Yes Yes Yes Yes  Type of Advance Directive Living will Living will Healthcare Power of [place];Living will Healthcare Power of [place];Living will Healthcare Power of [place];Living will Healthcare Power of [place];Living will Healthcare Power of [place];Living will  Does patient want to make changes to medical advance directive? No - Patient declined No - Patient declined No - Patient declined  Yes (Inpatient - patient requests chaplain consult to change a medical advance directive)  No - Patient declined  Copy of Healthcare Power of Attorney in Chart?   No - copy requested No - copy requested   No - copy requested    Current Medications (verified) Outpatient Encounter Medications as of 10/26/2022  Medication Sig   albuterol (VENTOLIN HFA) 108 (90 Base)  MCG/ACT inhaler Inhale 2 puffs into the lungs every 6 (six) hours as needed for wheezing or shortness of breath.   Ascorbic Acid (VITAMIN C) 1000 MG tablet Take 1,000 mg by mouth 2 (two) times daily.   aspirin EC 81 MG EC tablet Take 1 tablet (81 mg total) by mouth daily. Swallow whole.   atenolol-chlorthalidone (TENORETIC) 50-25 MG tablet Take 1 tablet by mouth daily.   b complex vitamins tablet Take 1 tablet by mouth at bedtime.   budesonide (ENTOCORT EC) 3 MG 24 hr capsule Take 3 capsules (9 mg total) by mouth daily.   Calcium Carbonate-Vitamin D (CALCIUM + D PO) Take 1 tablet by mouth at bedtime.   CHELATED MAGNESIUM PO Take 3 tablets by mouth every morning.   Cholecalciferol (D 5000) 5000 units capsule Take 5,000 Units by mouth 3 (three) times daily.   Coenzyme Q10 (COQ10) 100 MG CAPS Take 100 mg by mouth every evening.    diphenoxylate-atropine (LOMOTIL) 2.5-0.025 MG tablet TAKE 1 TABLET BY MOUTH FOUR TIMES A DAY AS NEEDED FOR DIARRHEA OR LOOSE STOOLS (Patient taking differently: Take 1 tablet by mouth 4 (four) times daily as needed for diarrhea or loose stools.)   Ferrous Sulfate (IRON) 325 (65 Fe) MG TABS Take 1 tablet (325 mg total) by mouth 2 (two) times daily.   Fiber POWD Take 5 mLs by mouth 2 (two) times daily. Heather's Tummy - mix in water and drink   folic acid (FOLVITE) 800 MCG tablet Take 800  mcg by mouth every evening.    hyoscyamine (LEVSIN SL) 0.125 MG SL tablet Take 1-2 tablets by mouth every 4 hours as needed (Patient taking differently: Take 0.125 mg by mouth 2 (two) times daily as needed (stomach cramps).)   ketotifen (ZADITOR) 0.025 % ophthalmic solution Place 2 drops into both eyes 2 (two) times daily as needed (allergies).   KLOR-CON M20 20 MEQ tablet TAKE 1 TABLET TWICE A DAY   lisinopril (ZESTRIL) 2.5 MG tablet TAKE 1 TABLET DAILY   Melatonin 3 MG TABS Take 3 mg by mouth at bedtime.    methocarbamol (ROBAXIN) 500 MG tablet TAKE 1 TABLET BY MOUTH EVERY 8 HOURS AS  NEEDED FOR MUSCLE SPASMS (Patient taking differently: Take 500 mg by mouth every 8 (eight) hours as needed for muscle spasms.)   mirabegron ER (MYRBETRIQ) 50 MG TB24 tablet Take 1 tablet (50 mg total) by mouth daily.   Multiple Vitamin (MULTIVITAMIN WITH MINERALS) TABS tablet Take 1 tablet by mouth at bedtime.   OVER THE COUNTER MEDICATION Take 5 drops by mouth at bedtime. Lemon Balm   OVER THE COUNTER MEDICATION Take 1 Dose by mouth at bedtime. Cats Claw liquid daily   OVER THE COUNTER MEDICATION Apply 1 application topically at bedtime. Magnesium Lotion   polyvinyl alcohol (LIQUIFILM TEARS) 1.4 % ophthalmic solution Place 1 drop into both eyes daily as needed for dry eyes.   Probiotic Product (PROBIOTIC DAILY PO) Take 1 capsule by mouth 2 (two) times daily.   simvastatin (ZOCOR) 20 MG tablet Take 1 tablet (20 mg total) by mouth daily.   Sodium Fluoride (SODIUM FLUORIDE 5000 PPM) 1.1 % PSTE Place 1 application onto teeth at bedtime.   Zinc Sulfate (ZINC 15 PO) Take 10 drops by mouth at bedtime. Liquid Blend 10 drops equal 15 mg   No facility-administered encounter medications on file as of 10/26/2022.    Allergies (verified) Egg-derived products, Etodolac, Fish-derived products, Omeprazole, Pineapple, and Gluten meal   History: Past Medical History:  Diagnosis Date   Arthritis    back, fingers with joint pain and swelling.  chronic back pain   Cataract    Chronic back pain    arthritis    Diverticulosis    Elevated cholesterol    takes Niacin daily and Simvastatin   GERD (gastroesophageal reflux disease) 06/2004   non-specific gastritis on EGD 06/2004   Headache(784.0)    occasionally   History of colon polyps 2004, 2009   2004:adenomatous. 2009 hyperplastic.    History of hiatal hernia    Hypertension    takes Tenoretic and Lisinopril daily   Mucoid cyst of joint 08/2013   right index finger   Osteopenia 01/2014   T score -1.1 FRAX 14%/0.5%. Stable from prior DEXA   PONV  (postoperative nausea and vomiting)    Rotator cuff arthropathy    Left   Seasonal allergies    Stroke (HCC) 1998   x 2 - mild left-sided weakness   Urge incontinence    Uterine prolapse    Past Surgical History:  Procedure Laterality Date   BACK SURGERY     x 2   BELPHAROPTOSIS REPAIR Bilateral 12/14/2017   BUBBLE STUDY  02/16/2021   Procedure: BUBBLE STUDY;  Surgeon: Vesta Mixer, MD;  Location: [facility] ENDOSCOPY;  Service: Cardiovascular;;   CATARACT EXTRACTION     CATARACT EXTRACTION Bilateral 10/2017   COLONOSCOPY  2004, 2009, 2014   FINGER SURGERY     GUM SURGERY  GYNECOLOGIC CRYOSURGERY     HYSTEROSCOPY WITH D & C  12/07/2010   with resection of endometrial polyp   JOINT REPLACEMENT     KNEE ARTHROSCOPY Left 2008   lip biopsy     done at MD office Fri 11/22/13   MASS EXCISION Right 08/15/2013   Procedure: RIGHT INDEX EXCISION MASS ;  Surgeon: Tami Ribas, MD;  Location:  SURGERY CENTER;  Service: Orthopedics;  Laterality: Right;   NASAL SEPTUM SURGERY     OOPHORECTOMY Right 2000   REVERSE SHOULDER ARTHROPLASTY Left 12/11/2018   REVERSE SHOULDER ARTHROPLASTY Left 12/11/2018   Procedure: LEFT REVERSE SHOULDER ARTHROPLASTY;  Surgeon: Cammy Copa, MD;  Location: [facility] OR;  Service: Orthopedics;  Laterality: Left;   SHOULDER ARTHROSCOPY WITH OPEN ROTATOR CUFF REPAIR AND DISTAL CLAVICLE ACROMINECTOMY Right 11/26/2013   Procedure: RIGHT SHOULDER ARTHROSCOPY WITH MINI OPEN ROTATOR CUFF REPAIR AND DISTAL CLAVICLE RESECTION, SUBACROMIAL DECOMPRESSION, POSSIBLE [facility] PATCH.;  Surgeon: Valeria Batman, MD;  Location: [facility] OR;  Service: Orthopedics;  Laterality: Right;   TEE WITHOUT CARDIOVERSION N/A 02/16/2021   Procedure: TRANSESOPHAGEAL ECHOCARDIOGRAM (TEE);  Surgeon: Elease Hashimoto Deloris Ping, MD;  Location: [facility] ENDOSCOPY;  Service: Cardiovascular;  Laterality: N/A;   TOTAL KNEE ARTHROPLASTY Right 03/21/2017   TOTAL KNEE ARTHROPLASTY Right 03/21/2017   Procedure: RIGHT  TOTAL KNEE ARTHROPLASTY;  Surgeon: Valeria Batman, MD;  Location: [facility] OR;  Service: Orthopedics;  Laterality: Right;   TUBAL LIGATION     Family History  Problem Relation Age of Onset   Hypertension Mother    Heart disease Mother    Diabetes Father    Hypertension Father    Heart disease Father    Stroke Father    Diabetes Maternal Aunt    Diabetes Paternal Grandmother    Hypertension Paternal Grandfather    Stroke Paternal Grandfather    Colon cancer Neg Hx    Esophageal cancer Neg Hx    Rectal cancer Neg Hx    Stomach cancer Neg Hx    Sleep apnea Neg Hx    Social History   Socioeconomic History   Marital status: Married    Spouse name: Not on file   Number of children: 2   Years of education: 12   Highest education level: Not on file  Occupational History   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  Other Topics Concern   Not on file  Social History Narrative   Randall grew up in [place], [place]. She lives with her husband Karen Kitchens). She has 1 daughter Efraim Kaufmann), 1 son Lenard Galloway) and 2 stepchildren (Benny & Gilda Crease). They have 3 dogs ([name], Irena, [address]) and 3 cats ([place], Rodney, [place]). She enjoys watching movies and outdoors activities.Exercise - noneCaffeine - varies depending on the day. May have between 1-4 cups daily.  Rare Soda.    Social Determinants of Health   Financial Resource Strain: Low Risk  (11/09/2022)   Overall Financial Resource  Strain (CARDIA)    Difficulty of Paying  Living Expenses: Not hard at all  Food Insecurity: No Food Insecurity (11/09/2022)   Hunger Vital Sign    Worried About Running Out of Food in the Last Year: Never true    Ran Out of Food in the Last Year: Never true  Transportation Needs: No Transportation Needs (11/09/2022)   PRAPARE - Administrator, Civil Service (Medical): No    Lack of Transportation (Non-Medical): No  Physical Activity: Inactive (11/09/2022)   Exercise Vital Sign    Days of Exercise per Week: 0 days    Minutes of Exercise per Session: 0 min  Stress: No Stress Concern Present (11/09/2022)   [employer] of Occupational Health - Occupational Stress Questionnaire    Feeling of Stress : Not at all  Social Connections: Moderately Isolated (11/09/2022)   Social Connection and Isolation Panel [NHANES]    Frequency of Communication with Friends and Family: Three times a week    Frequency of Social Gatherings with Friends and Family: Three times a week    Attends Religious Services: Never    Active Member of Clubs or Organizations: No    Attends Banker Meetings: Never    Marital Status: Married    Tobacco Counseling Counseling given: Not Answered   Clinical Intake:  Pre-visit preparation completed: Yes  Pain : 0-10 Pain Score: 8  Pain Location: Knee Pain Orientation: Other (Comment) Pain Onset: Today Pain Frequency: Several days a week     BMI - recorded: 30.54 Nutritional Status: BMI > 30  Obese Nutritional Risks: None Diabetes: No  How often do you need to have someone help you when you read instructions, pamphlets, or other written materials from your doctor or pharmacy?: 1 - Never What is the last grade level you completed in school?: 12th grade  Interpreter Needed?: No      Activities of Daily Living    11/09/2022    9:36 AM  In your present state of health, do you have any difficulty performing the following activities:  Preparing Food and eating ? N  Using the  Toilet? N  In the past six months, have you accidently leaked urine? N  Do you have problems with loss of bowel control? N  Managing your Medications? N  Managing your Finances? N  Housekeeping or managing your Housekeeping? N    Patient Care Team: Myrlene Broker, MD as PCP - General (Internal Medicine) Jodelle Red, MD as PCP - Cardiology (Cardiology)  Indicate any recent Medical Services you may have received from other than [facility] providers in the past year (date may be approximate).     Assessment:   This is a routine wellness examination for [place].  Hearing/Vision screen No results found.  Dietary issues and exercise activities discussed:     Goals Addressed               This Visit's Progress     Patient Stated (pt-stated)        To be bale to fully recover after Knee Surgery       Depression Screen    11/09/2022    8:34 AM 11/07/2022   11:16 AM 09/22/2022   11:03 AM 04/07/2022    2:02 PM 03/25/2022   10:43 AM 03/25/2022   10:42 AM 09/24/2021    2:24 PM  PHQ 2/9 Scores  PHQ - 2 Score 0 0 0 0 0 0 0  PHQ- 9 Score  0 0 0      Fall Risk    11/09/2022    8:34 AM 11/07/2022   11:16 AM 09/22/2022   11:03 AM 04/07/2022    2:02 PM 03/25/2022   10:41 AM  Fall Risk   Falls in the past year? 0 0 0 0 0  Number falls in past yr: 0 0 0 0 0  Injury with Fall? 0 0 0 0 0  Risk for fall due to : No Fall Risks No Fall Risks  No Fall Risks   Follow up Falls evaluation completed Falls evaluation completed Falls evaluation completed Falls evaluation completed Falls evaluation completed    MEDICARE RISK AT HOME:  Medicare Risk at Home - 11/09/22 0930     Any stairs in or around the home? No    If so, are there any without handrails? No    Home free of loose throw rugs in walkways, pet beds, electrical cords, etc? Yes    Adequate lighting in your home to reduce risk of falls? Yes    Life alert? No    Use of a cane, walker or w/c? Yes   walker, due to  knee   Grab bars in the bathroom? No    Shower chair or bench in shower? Yes    Elevated toilet seat or a handicapped toilet? No             TIMED UP AND GO:  Was the test performed?  No    Cognitive Function:        Immunizations Immunization History  Administered Date(s) Administered   Influenza Inj Mdck Quad Pf 01/11/2019   Influenza Whole 02/25/2004, 01/21/2009   PFIZER(Purple Top)SARS-COV-2 Vaccination 05/26/2019, 06/18/2019, 03/03/2020   Pneumococcal Conjugate-13 12/08/2015   Pneumococcal Polysaccharide-23 01/02/2017   Td 06/09/2010    TDAP status: Due, Education has been provided regarding the importance of this vaccine. Advised may receive this vaccine at local pharmacy or Health Dept. Aware to provide a copy of the vaccination record if obtained from local pharmacy or Health Dept. Verbalized acceptance and understanding.  Flu Vaccine status: Up to date  Pneumococcal vaccine status: Up to date  Covid-19 vaccine status: Completed vaccines  Qualifies for Shingles Vaccine? Yes   Zostavax completed No   Shingrix Completed?: No.    Education has been provided regarding the importance of this vaccine. Patient has been advised to call insurance company to determine out of pocket expense if they have not yet received this vaccine. Advised may also receive vaccine at local pharmacy or Health Dept. Verbalized acceptance and understanding.  Screening Tests Health Maintenance  Topic Date Due   Hepatitis C Screening  Never done   DTaP/Tdap/Td (2 - Tdap) 06/08/2020   COVID-19 Vaccine (4 - 2023-24 season) 12/10/2021   INFLUENZA VACCINE  11/10/2022   Medicare Annual Wellness (AWV)  10/26/2023   MAMMOGRAM  05/12/2024   Colonoscopy  02/09/2030   Pneumonia Vaccine 67+ Years old  Completed   DEXA SCAN  Completed   HPV VACCINES  Aged Out   Zoster Vaccines- Shingrix  Discontinued    Health Maintenance  Health Maintenance Due  Topic Date Due   Hepatitis C Screening   Never done   DTaP/Tdap/Td (2 - Tdap) 06/08/2020   COVID-19 Vaccine (4 - 2023-24 season) 12/10/2021    Colorectal cancer screening: Type of screening: Colonoscopy. Completed 11/30/12. Repeat every 5 years  Mammogram status: Completed 08/01/222. Repeat every year  Bone Density status: Completed 09/30/2021. Results  reflect: Bone density results: OSTEOPOROSIS. Repeat every 3 years.  Lung Cancer Screening: (Low Dose CT Chest recommended if Age 70-80 years, 20 pack-year currently smoking OR have quit w/in 15years.) does not qualify.   Lung Cancer Screening Referral: N/A  Additional Screening:  Hepatitis C Screening: does not qualify; Completed N/A  Vision Screening: Recommended annual ophthalmology exams for early detection of glaucoma and other disorders of the eye. Is the patient up to date with their annual eye exam?  Yes  Who is the provider or what is the name of the office in which the patient attends annual eye exams? [facility] If pt is not established with a provider, would they like to be referred to a provider to establish care? No .   Dental Screening: Recommended annual dental exams for proper oral hygiene  Community Resource Referral / Chronic Care Management: CRR required this visit?  No   CCM required this visit?  No     Plan:     I have personally reviewed and noted the following in the patient's chart:   Medical and social history Use of alcohol, tobacco or illicit drugs  Current medications and supplements including opioid prescriptions. Patient is not currently taking opioid prescriptions. Functional ability and status Nutritional status Physical activity Advanced directives List of other physicians Hospitalizations, surgeries, and ER visits in previous 12 months Vitals Screenings to include cognitive, depression, and falls Referrals and appointments  In addition, I have reviewed and discussed with patient certain preventive protocols, quality  metrics, and best practice recommendations. A written personalized care plan for preventive services as well as general preventive health recommendations were provided to patient.     Ferdie Ping, CMA   11/09/2022   After Visit Summary: (Declined) Due to this being a telephonic visit, with patients personalized plan was offered to patient but patient Declined AVS at this time   Nurse Notes:  Ms. Daris , Thank you for taking time to come for your Medicare Wellness Visit. I appreciate your ongoing commitment to your health goals. Please review the following plan we discussed and let me know if I can assist you in the future.   These are the goals we discussed:  Goals       Patient Stated (pt-stated)      To be bale to fully recover after Knee Surgery         This is a list of the screening recommended for you and due dates:  Health Maintenance  Topic Date Due   Hepatitis C Screening  Never done   DTaP/Tdap/Td vaccine (2 - Tdap) 06/08/2020   COVID-19 Vaccine (4 - 2023-24 season) 12/10/2021   Flu Shot  11/10/2022   Medicare Annual Wellness Visit  10/26/2023   Mammogram  05/12/2024   Colon Cancer Screening  02/09/2030   Pneumonia Vaccine  Completed   DEXA scan (bone density measurement)  Completed   HPV Vaccine  Aged Out   Zoster (Shingles) Vaccine  Discontinued      Medical screening examination/treatment/procedure(s) were performed by non-physician practitioner and as supervising physician I was immediately available for consultation/collaboration.  I agree with above. Jacinta Shoe, MD

## 2022-10-31 ENCOUNTER — Ambulatory Visit (INDEPENDENT_AMBULATORY_CARE_PROVIDER_SITE_OTHER): Payer: Medicare Other | Admitting: Orthopedic Surgery

## 2022-10-31 ENCOUNTER — Other Ambulatory Visit (INDEPENDENT_AMBULATORY_CARE_PROVIDER_SITE_OTHER): Payer: Medicare Other

## 2022-10-31 DIAGNOSIS — M25562 Pain in left knee: Secondary | ICD-10-CM

## 2022-11-01 ENCOUNTER — Encounter: Payer: Self-pay | Admitting: Orthopedic Surgery

## 2022-11-01 NOTE — Progress Notes (Unsigned)
Office Visit Note   Patient: [name]           Date of Birth: [birth year]           MRN: [id number] Visit Date: 10/31/2022 Requested by: Myrlene Broker, MD [address] [place],  [place] [address] PCP: Myrlene Broker, MD  Subjective: Chief Complaint  Patient presents with   Left Knee - Pain    HPI: [name] is a 72 y.o. female who presents to the office reporting left knee pain.  Pain has been going on for 6 months.  Describes weakness and giving way.  The pain has been actually particularly severe the last few weeks.  She is not sleeping.  At times it is hard for her to walk.  She reports a lot of pain medially as well as on the medial aspect of the patella.  She reports catching and mechanical symptoms.  She did have a right total knee replacement done years ago but had a difficult recovery from that.  Also describes no history of DVT or pulmonary embolism but does have a history of mini strokes the last one was in 2022.  She does take a baby aspirin for that.  She has 2 garage stairs to get into her house at home.  States that her husband may or may not be sufficient to care for her at home..                ROS: All systems reviewed are negative as they relate to the chief complaint within the history of present illness.  Patient denies fevers or chills.  Assessment & Plan: Visit Diagnoses:  1. Left knee pain, unspecified chronicity     Plan: Impression is end-stage arthritis left knee with leg pain rest pain as well as pain refractory to nonoperative management.  Her arthritis is severe.  Injections would give at most may be several weeks of relief.  She would like to proceed with total knee replacement.  The risk and benefits of total knee replacement are discussed with the patient including not limited to infection nerve and vessel damage incomplete pain relief as well as potential for revision.  The somewhat grueling nature of the rehabilitative  process is also discussed.  Patient understands and wishes to proceed.  We will obtain medical risk stratification due to her history of mini strokes.  Follow-up 2 weeks after surgery.  Follow-Up Instructions: No follow-ups on file.   Orders:  Orders Placed This Encounter  Procedures   XR KNEE 3 VIEW LEFT   No orders of the defined types were placed in this encounter.     Procedures: No procedures performed   Clinical Data: No additional findings.  Objective: Vital Signs: LMP 02/09/2005   Physical Exam:  Constitutional: Patient appears well-developed HEENT:  Head: Normocephalic Eyes:EOM are normal Neck: Normal range of motion Cardiovascular: Normal rate Pulmonary/chest: Effort normal Neurologic: Patient is alert Skin: Skin is warm Psychiatric: Patient has normal mood and affect  Ortho Exam: Ortho exam demonstrates full active and passive range of motion of the hips.  Left knee has range of motion 5-100 with medial joint line tenderness and patellofemoral crepitus.  AP pulses 1+ out of 4.  Ankle dorsiflexion intact.  No patellar apprehension.  Collateral and cruciate ligaments are stable.  Mild varus alignment is present.  No masses lymphadenopathy or skin changes noted in that left knee region  Specialty Comments:  No specialty comments available.  Imaging: No results found.   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  chronic back pain   Cataract    Chronic back pain    arthritis    Diverticulosis    Elevated cholesterol    takes Niacin daily and Simvastatin   GERD (gastroesophageal reflux disease) 06/2004   non-specific gastritis on EGD 06/2004   Headache(784.0)    occasionally   History of colon polyps 2004, 2009   2004:adenomatous. 2009 hyperplastic.    History of hiatal hernia    Hypertension    takes Tenoretic and Lisinopril daily   Mucoid cyst of joint 08/2013   right index finger   Osteopenia 01/2014   T score -1.1 FRAX 14%/0.5%. Stable from prior DEXA   PONV (postoperative nausea and vomiting)    Rotator cuff arthropathy    Left   Seasonal allergies    Stroke (HCC) 1998   x 2 - mild left-sided weakness   Urge incontinence    Uterine prolapse     Family History  Problem Relation Age of Onset   Hypertension Mother    Heart disease Mother    Diabetes Father    Hypertension Father    Heart disease Father    Stroke Father    Diabetes Maternal Aunt    Diabetes Paternal Grandmother    Hypertension Paternal Grandfather    Stroke Paternal Grandfather    Colon cancer Neg Hx    Esophageal cancer Neg Hx    Rectal cancer Neg Hx    Stomach cancer Neg Hx    Sleep apnea Neg Hx     Past Surgical History:  Procedure Laterality Date   BACK SURGERY     x 2   BELPHAROPTOSIS REPAIR Bilateral 12/14/2017   BUBBLE STUDY  02/16/2021   Procedure: BUBBLE STUDY;  Surgeon: Vesta Mixer, MD;  Location: [facility] ENDOSCOPY;  Service: Cardiovascular;;   CATARACT EXTRACTION     CATARACT EXTRACTION Bilateral 10/2017   COLONOSCOPY  2004, 2009, 2014   FINGER SURGERY     GUM SURGERY     GYNECOLOGIC CRYOSURGERY     HYSTEROSCOPY WITH D & C  12/07/2010   with resection of endometrial polyp   JOINT REPLACEMENT     KNEE ARTHROSCOPY Left 2008   lip biopsy     done at  MD office Fri 11/22/13   MASS EXCISION Right 08/15/2013   Procedure: RIGHT INDEX EXCISION MASS ;  Surgeon: Tami Ribas, MD;  Location: [facility];  Service: Orthopedics;  Laterality: Right;   NASAL SEPTUM SURGERY     OOPHORECTOMY Right 2000   REVERSE SHOULDER ARTHROPLASTY Left 12/11/2018   REVERSE SHOULDER ARTHROPLASTY Left 12/11/2018   Procedure: LEFT REVERSE SHOULDER ARTHROPLASTY;  Surgeon: Cammy Copa, MD;  Location: [facility] OR;  Service: Orthopedics;  Laterality: Left;  SHOULDER ARTHROSCOPY WITH OPEN ROTATOR CUFF REPAIR AND DISTAL CLAVICLE ACROMINECTOMY Right 11/26/2013   Procedure: RIGHT SHOULDER ARTHROSCOPY WITH MINI OPEN ROTATOR CUFF REPAIR AND DISTAL CLAVICLE RESECTION, SUBACROMIAL DECOMPRESSION, POSSIBLE [facility] PATCH.;  Surgeon: Valeria Batman, MD;  Location: [facility] OR;  Service: Orthopedics;  Laterality: Right;   TEE WITHOUT CARDIOVERSION N/A 02/16/2021   Procedure: TRANSESOPHAGEAL ECHOCARDIOGRAM (TEE);  Surgeon: Elease Hashimoto Deloris Ping, MD;  Location[facility] END[facility]COPY;  Service: Cardiovascular;  Laterality: N/A;   TOTAL KNEE ARTHROPLASTY Right 03/21/2017   TOTAL KNEE ARTHROPLASTY Right 03/21/2017   Procedure: RIGHT TOTAL KNEE ARTHROPLASTY;  Surgeon: Valeria Batman, MD;  Location: [facility] OR;  Service: Orthopedics;  Laterality: Right;   TUBAL LIGATION     Social History   Occupational History   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

## 2022-11-04 ENCOUNTER — Telehealth: Payer: Self-pay | Admitting: Internal Medicine

## 2022-11-04 NOTE — Telephone Encounter (Signed)
Surgical clearance form received from [place] at [facility] and placed in box up front. Patient is scheduled for surgery on 11-29-22. Please fax to [phone] when complete.

## 2022-11-08 NOTE — Telephone Encounter (Signed)
Placed inside providers box

## 2022-11-09 ENCOUNTER — Telehealth: Payer: Self-pay | Admitting: Orthopedic Surgery

## 2022-11-09 NOTE — Telephone Encounter (Signed)
Patient request something for pain  [facility] in [place]

## 2022-11-10 NOTE — Pre-Procedure Instructions (Signed)
Surgical Instructions    Your procedure is scheduled on Tuesday 11/29/22.   Report to [facility] Main Entrance "A" at 05:30 A.M., then check in with the Admitting office.  Call this number if you have problems the morning of surgery:  [phone]   If you have any questions prior to your surgery date call [phone]: Open Monday-Friday 8am-4pm If you experience any cold or flu symptoms such as cough, fever, chills, shortness of breath, etc. between now and your scheduled surgery, please notify us at the above number     Remember:  Do not eat after midnight the night before your surgery  You may drink clear liquids until 04:30 A.M. the morning of your surgery.   Clear liquids allowed are: Water, Non-Citrus Juices (without pulp), Carbonated Beverages, Clear Tea, Black Coffee ONLY (NO MILK, CREAM OR POWDERED CREAMER of any kind), and Gatorade    Take these medicines the morning of surgery with A SIP OF WATER:   simvastatin (ZOCOR)     Take these medicines if needed:   acetaminophen (TYLENOL)   ketotifen (ZADITOR)   methocarbamol (ROBAXIN)    As of today, STOP taking any Aspirin (unless otherwise instructed by your surgeon) Aleve, Naproxen, Ibuprofen, Motrin, Advil, Goody's, BC's, all herbal medications, fish oil, diclofenac Sodium (VOLTAREN), and all vitamins.           Do not wear jewelry or makeup. Do not wear lotions, powders, perfumes/cologne or deodorant. Do not shave 48 hours prior to surgery.  Men may shave face and neck. Do not bring valuables to the hospital. Do not wear nail polish, gel polish, artificial nails, or any other type of covering on natural nails (fingers and toes) If you have artificial nails or gel coating that need to be removed by a nail salon, please have this removed prior to surgery. Artificial nails or gel coating may interfere with anesthesia's ability to adequately monitor your vital signs.  [facility] is not responsible for any belongings or  valuables.    Do NOT Smoke (Tobacco/Vaping)  24 hours prior to your procedure  If you use a CPAP at night, you may bring your mask for your overnight stay.   Contacts, glasses, hearing aids, dentures or partials may not be worn into surgery, please bring cases for these belongings   For patients admitted to the hospital, discharge time will be determined by your treatment team.   Patients discharged the day of surgery will not be allowed to drive home, and someone needs to stay with them for 24 hours.   SURGICAL WAITING ROOM VISITATION Patients having surgery or a procedure may have no more than 2 support people in the waiting area - these visitors may rotate.   Children under the age of 7 must have an adult with them who is not the patient. If the patient needs to stay at the hospital during part of their recovery, the visitor guidelines for inpatient rooms apply. Pre-op nurse will coordinate an appropriate time for 1 support person to accompany patient in pre-op.  This support person may not rotate.   Please refer to [email or web address] for the visitor guidelines for Inpatients (after your surgery is over and you are in a regular room).    Special instructions:    Oral Hygiene is also important to reduce your risk of infection.  Remember - BRUSH YOUR TEETH THE MORNING OF SURGERY WITH YOUR REGULAR TOOTHPASTE   [facility]- Preparing For Surgery  Before surgery, you can play  an important role. Because skin is not sterile, your skin needs to be as free of germs as possible. You can reduce the number of germs on your skin by washing with CHG (chlorahexidine gluconate) Soap before surgery.  CHG is an antiseptic cleaner which kills germs and bonds with the skin to continue killing germs even after washing.     Please do not use if you have an allergy to CHG or antibacterial soaps. If your skin becomes reddened/irritated stop using the  CHG.  Do not shave (including legs and underarms) for at least 48 hours prior to first CHG shower. It is OK to shave your face.  Please follow these instructions carefully.     Shower the NIGHT BEFORE SURGERY and the MORNING OF SURGERY with CHG Soap.   If you chose to wash your hair, wash your hair first as usual with your normal shampoo. After you shampoo, rinse your hair and body thoroughly to remove the shampoo.  Then [employer] and genitals (private parts) with your normal soap and rinse thoroughly to remove soap.  After that Use CHG Soap as you would any other liquid soap. You can apply CHG directly to the skin and wash gently with a scrungie or a clean washcloth.   Apply the CHG Soap to your body ONLY FROM THE NECK DOWN.  Do not use on open wounds or open sores. Avoid contact with your eyes, ears, mouth and genitals (private parts). Wash Face and genitals (private parts)  with your normal soap.   Wash thoroughly, paying special attention to the area where your surgery will be performed.  Thoroughly rinse your body with warm water from the neck down.  DO NOT shower/wash with your normal soap after using and rinsing off the CHG Soap.  Pat yourself dry with a CLEAN TOWEL.  Wear CLEAN PAJAMAS to bed the night before surgery  Place CLEAN SHEETS on your bed the night before your surgery  DO NOT SLEEP WITH PETS.   Day of Surgery:  Take a shower with CHG soap. Wear Clean/Comfortable clothing the morning of surgery Do not apply any deodorants/lotions.   Remember to brush your teeth WITH YOUR REGULAR TOOTHPASTE.    If you received a COVID test during your pre-op visit, it is requested that you wear a mask when out in public, stay away from anyone that may not be feeling well, and notify your surgeon if you develop symptoms. If you have been in contact with anyone that has tested positive in the last 10 days, please notify your surgeon.    Please read over the following fact sheets  that you were given.

## 2022-11-11 ENCOUNTER — Other Ambulatory Visit: Payer: Self-pay | Admitting: Surgical

## 2022-11-11 ENCOUNTER — Encounter (HOSPITAL_COMMUNITY): Payer: Self-pay

## 2022-11-11 ENCOUNTER — Other Ambulatory Visit: Payer: Self-pay

## 2022-11-11 ENCOUNTER — Encounter (HOSPITAL_COMMUNITY)
Admission: RE | Admit: 2022-11-11 | Discharge: 2022-11-11 | Disposition: A | Discharge status: Home / Self Care | Payer: Medicare Other | Source: Ambulatory Visit | Attending: Orthopedic Surgery | Admitting: Orthopedic Surgery

## 2022-11-11 VITALS — BP 118/57 | HR 76 | Temp 98.0°F | Resp 17 | Ht 62.0 in | Wt 166.7 lb

## 2022-11-11 DIAGNOSIS — Z01818 Encounter for other preprocedural examination: Secondary | ICD-10-CM

## 2022-11-11 DIAGNOSIS — I2510 Atherosclerotic heart disease of native coronary artery without angina pectoris: Secondary | ICD-10-CM | POA: Diagnosis not present

## 2022-11-11 HISTORY — DX: Myoneural disorder, unspecified: G70.9

## 2022-11-11 HISTORY — DX: Sleep apnea, unspecified: G47.30

## 2022-11-11 LAB — URINALYSIS, COMPLETE (UACMP) WITH MICROSCOPIC
Bilirubin Urine: NEGATIVE
Glucose, UA: NEGATIVE mg/dL
Hgb urine dipstick: NEGATIVE
Ketones, ur: NEGATIVE mg/dL
Leukocytes,Ua: NEGATIVE
Nitrite: NEGATIVE
Protein, ur: NEGATIVE mg/dL
Specific Gravity, Urine: 1.02 (ref 1.005–1.030)
pH: 6 (ref 5.0–8.0)

## 2022-11-11 LAB — CBC
HCT: 36.9 % (ref 36.0–46.0)
Hemoglobin: 10.8 g/dL — ABNORMAL LOW (ref 12.0–15.0)
MCH: 23 pg — ABNORMAL LOW (ref 26.0–34.0)
MCHC: 29.3 g/dL — ABNORMAL LOW (ref 30.0–36.0)
MCV: 78.7 fL — ABNORMAL LOW (ref 80.0–100.0)
Platelets: 264 10*3/uL (ref 150–400)
RBC: 4.69 MIL/uL (ref 3.87–5.11)
RDW: 17.5 % — ABNORMAL HIGH (ref 11.5–15.5)
WBC: 7.8 10*3/uL (ref 4.0–10.5)
nRBC: 0 % (ref 0.0–0.2)

## 2022-11-11 LAB — BASIC METABOLIC PANEL
Anion gap: 12 (ref 5–15)
BUN: 20 mg/dL (ref 8–23)
CO2: 26 mmol/L (ref 22–32)
Calcium: 9.6 mg/dL (ref 8.9–10.3)
Chloride: 101 mmol/L (ref 98–111)
Creatinine, Ser: 0.85 mg/dL (ref 0.44–1.00)
GFR, Estimated: >60 mL/min (ref >60)
Glucose, Bld: 93 mg/dL (ref 70–99)
Potassium: 3.6 mmol/L (ref 3.5–5.1)
Sodium: 139 mmol/L (ref 135–145)

## 2022-11-11 LAB — SURGICAL PCR SCREEN
MRSA, PCR: NEGATIVE
Staphylococcus aureus: NEGATIVE

## 2022-11-11 MED ORDER — TRAMADOL HCL 50 MG PO TABS: 50.0000 mg | ORAL_TABLET | Freq: Every day | ORAL | 0 refills | Status: DC | PRN

## 2022-11-11 NOTE — Pre-Procedure Instructions (Signed)
Surgical Instructions    Your procedure is scheduled on Tuesday 11/29/22.   Report to [facility] Main Entrance "A" at 05:30 A.M., then check in with the Admitting office.  Call this number if you have problems the morning of surgery:  [phone]   If you have any questions prior to your surgery date call [phone]: Open Monday-Friday 8am-4pm If you experience any cold or flu symptoms such as cough, fever, chills, shortness of breath, etc. between now and your scheduled surgery, please notify us at the above number     Remember:  Do not eat after midnight the night before your surgery  You may drink clear liquids until 04:30 A.M. the morning of your surgery.   Clear liquids allowed are: Water, Non-Citrus Juices (without pulp), Carbonated Beverages, Clear Tea, Black Coffee ONLY (NO MILK, CREAM OR POWDERED CREAMER of any kind), and Gatorade    Take these medicines the morning of surgery with A SIP OF WATER:   simvastatin (ZOCOR)     Take these medicines if needed:   acetaminophen (TYLENOL)   ketotifen (ZADITOR)   methocarbamol (ROBAXIN)    As of today, STOP taking any Aspirin (unless otherwise instructed by your surgeon) Aleve, Naproxen, Ibuprofen, Motrin, Advil, Goody's, BC's, all herbal medications, fish oil, diclofenac Sodium (VOLTAREN), and all vitamins.            [facility] is not responsible for any belongings or valuables.    Do NOT Smoke (Tobacco/Vaping)  24 hours prior to your procedure  If you use a CPAP at night, you may bring your mask for your overnight stay.   Contacts, glasses, hearing aids, dentures or partials may not be worn into surgery, please bring cases for these belongings   For patients admitted to the hospital, discharge time will be determined by your treatment team.   Patients discharged the day of surgery will not be allowed to drive home, and someone needs to stay with them for 24 hours.   SURGICAL WAITING ROOM VISITATION Patients having  surgery or a procedure may have no more than 2 support people in the waiting area - these visitors may rotate.   Children under the age of 43 must have an adult with them who is not the patient. If the patient needs to stay at the hospital during part of their recovery, the visitor guidelines for inpatient rooms apply. Pre-op nurse will coordinate an appropriate time for 1 support person to accompany patient in pre-op.  This support person may not rotate.   Please refer to [email or web address] for the visitor guidelines for Inpatients (after your surgery is over and you are in a regular room).    Special instructions:    Oral Hygiene is also important to reduce your risk of infection.  Remember - BRUSH YOUR TEETH THE MORNING OF SURGERY WITH YOUR REGULAR TOOTHPASTE     Pre-operative 5 CHG Bath Instructions   You can play a key role in reducing the risk of infection after surgery. Your skin needs to be as free of germs as possible. You can reduce the number of germs on your skin by washing with CHG (chlorhexidine gluconate) soap before surgery. CHG is an antiseptic soap that kills germs and continues to kill germs even after washing.   DO NOT use if you have an allergy to chlorhexidine/CHG or antibacterial soaps. If your skin becomes reddened or irritated, stop using the CHG and notify one of our RNs at [phone].   Please shower with  the CHG soap starting 4 days before surgery using the following schedule:     Please keep in mind the following:  DO NOT shave, including legs and underarms, starting the day of your first shower.   You may shave your face at any point before/day of surgery.  Place clean sheets on your bed the day you start using CHG soap. Use a clean washcloth (not used since being washed) for each shower. DO NOT sleep with pets once you start using the CHG.   CHG Shower Instructions:  If you choose to wash your hair  and private area, wash first with your normal shampoo/soap.  After you use shampoo/soap, rinse your hair and body thoroughly to remove shampoo/soap residue.  Turn the water OFF and apply about 3 tablespoons (45 ml) of CHG soap to a CLEAN washcloth.  Apply CHG soap ONLY FROM YOUR NECK DOWN TO YOUR TOES (washing for 3-5 minutes)  DO NOT use CHG soap on face, private areas, open wounds, or sores.  Pay special attention to the area where your surgery is being performed.  If you are having back surgery, having someone wash your back for you may be helpful. Wait 2 minutes after CHG soap is applied, then you may rinse off the CHG soap.  Pat dry with a clean towel  Put on clean clothes/pajamas   If you choose to wear lotion, please use ONLY the CHG-compatible lotions on the back of this paper.     Additional instructions for the day of surgery: DO NOT APPLY any lotions, deodorants, cologne, or perfumes.   Put on clean/comfortable clothes.  Brush your teeth.  Ask your nurse before applying any prescription medications to the skin. Do not wear jewelry or makeup. Men may shave face and neck. Do not bring valuables to the hospital. Do not wear nail polish, gel polish, artificial nails, or any other type of covering on natural nails (fingers and toes) If you have artificial nails or gel coating that need to be removed by a nail salon, please have this removed prior to surgery. Artificial nails or gel coating may interfere with anesthesia's ability to adequately monitor your vital signs.     CHG Compatible Lotions   Aveeno Moisturizing lotion  Cetaphil Moisturizing Cream  Cetaphil Moisturizing Lotion  Clairol Herbal Essence Moisturizing Lotion, Dry Skin  Clairol Herbal Essence Moisturizing Lotion, Extra Dry Skin  Clairol Herbal Essence Moisturizing Lotion, Normal Skin  Curel Age Defying Therapeutic Moisturizing Lotion with Alpha Hydroxy  Curel Extreme Care Body Lotion  Curel Soothing Hands  Moisturizing Hand Lotion  Curel Therapeutic Moisturizing Cream, Fragrance-Free  Curel Therapeutic Moisturizing Lotion, Fragrance-Free  Curel Therapeutic Moisturizing Lotion, Original Formula  Eucerin Daily Replenishing Lotion  Eucerin Dry Skin Therapy Plus Alpha Hydroxy Crme  Eucerin Dry Skin Therapy Plus Alpha Hydroxy Lotion  Eucerin Original Crme  Eucerin Original Lotion  Eucerin Plus Crme Eucerin Plus Lotion  Eucerin TriLipid Replenishing Lotion  Keri Anti-Bacterial Hand Lotion  Keri Deep Conditioning Original Lotion Dry Skin Formula Softly Scented  Keri Deep Conditioning Original Lotion, Fragrance Free Sensitive Skin Formula  Keri Lotion Fast Absorbing Fragrance Free Sensitive Skin Formula  Keri Lotion Fast Absorbing Softly Scented Dry Skin Formula  Keri Original Lotion  Keri Skin Renewal Lotion Keri Silky Smooth Lotion  Keri Silky Smooth Sensitive Skin Lotion  Nivea Body Creamy Conditioning Oil  Nivea Body Extra Enriched Teacher, adult education Moisturizing Lotion Nivea Crme  Nivea Skin  Firming Lotion  NutraDerm 30 Skin Lotion  NutraDerm Skin Lotion  NutraDerm Therapeutic Skin Cream  NutraDerm Therapeutic Skin Lotion  ProShield Protective Hand Cream  Provon moisturizing lotion     If you received a COVID test during your pre-op visit, it is requested that you wear a mask when out in public, stay away from anyone that may not be feeling well, and notify your surgeon if you develop symptoms. If you have been in contact with anyone that has tested positive in the last 10 days, please notify your surgeon.    Please read over the following fact sheets that you were given.

## 2022-11-11 NOTE — Telephone Encounter (Signed)
Needs visit with ekg

## 2022-11-11 NOTE — Progress Notes (Signed)
PCP - Hillard Danker- pt states she thinks she needs medical clearance from her PCP Cardiologist - pt denies having current cardiologist- states that she last saw cardiology after her stroke in 2022.   PPM/ICD - denies   Chest x-ray - N/A EKG - 11/11/2022  Stress Test - denies ECHO - 02/16/21 Cardiac Cath - denies  Sleep Study - Pt wears CPAP   Fasting Blood Sugar - N/A   Last dose of GLP1 agonist-  N/A  Blood Thinner Instructions: N/A Aspirin Instructions: Follow your surgeon's instructions on when to stop Aspirin.  If no instructions were given by your surgeon then you will need to call the office to get those instructions.     ERAS Protcol - ERAS per protocol- no orders from MD    COVID TEST- N/A   Anesthesia review: yes- pt reports she possibly needed Medical clearance for surgery. Pt denies any cardiac symptoms. Pt with hx stroke x2.   Patient denies shortness of breath, fever, cough and chest pain at PAT appointment   All instructions explained to the patient, with a verbal understanding of the material. Patient agrees to go over the instructions while at home for a better understanding.The opportunity to ask questions was provided.

## 2022-11-11 NOTE — Telephone Encounter (Signed)
Patient states that she had a EKG done about an hour ago at the hospital and was told that you should be able to see it.

## 2022-11-11 NOTE — Telephone Encounter (Signed)
Lvm advising  

## 2022-11-11 NOTE — Telephone Encounter (Signed)
Sent in prescription for tramadol.  Needs to take this sparingly.  Will not be refilled until after her surgery

## 2022-11-11 NOTE — Telephone Encounter (Signed)
Ok to sign but I have no form

## 2022-11-14 NOTE — Anesthesia Preprocedure Evaluation (Addendum)
Anesthesia Evaluation  Patient identified by MRN, date of birth, ID band Patient awake    Reviewed: Allergy & Precautions, NPO status , Patient's Chart, lab work & pertinent test results  History of Anesthesia Complications (+) PONV and history of anesthetic complications  Airway Mallampati: II  TM Distance: >3 FB Neck ROM: Full    Dental  (+) Missing,    Pulmonary sleep apnea and Continuous Positive Airway Pressure Ventilation    Pulmonary exam normal        Cardiovascular hypertension, Pt. on medications Normal cardiovascular exam     Neuro/Psych  Headaches CVA (left sided weakness), Residual Symptoms    GI/Hepatic Neg liver ROS, hiatal hernia,GERD  ,,  Endo/Other  negative endocrine ROS    Renal/GU negative Renal ROS  negative genitourinary   Musculoskeletal  (+) Arthritis ,    Abdominal   Peds  Hematology negative hematology ROS (+)   Anesthesia Other Findings Day of surgery medications reviewed with patient.  Reproductive/Obstetrics negative OB ROS                              Anesthesia Physical Anesthesia Plan  ASA: 2  Anesthesia Plan: Spinal   Post-op Pain Management:  Regional for Post-op pain and Tylenol PO (pre-op)* and Regional block*   Induction:   PONV Risk Score and Plan: 4 or greater and Treatment may vary due to age or medical condition, Ondansetron, Propofol infusion and Dexamethasone  Airway Management Planned: Natural Airway and Simple Face Mask  Additional Equipment: None  Intra-op Plan:   Post-operative Plan:   Informed Consent: I have reviewed the patients History and Physical, chart, labs and discussed the procedure including the risks, benefits and alternatives for the proposed anesthesia with the patient or authorized representative who has indicated his/her understanding and acceptance.       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  Pt was evaluated by cardiologist Dr. Cristal Deer following nontraumatic ICH in November of 2022. No afib seen on monitor, no shunt seen on TEE. She was last seen 04/19/21 and doing well at that time, recommended to continue current medical management and followup with cardiology on an as needed basis.   Preop lab reviewed, mild anemia with HGB 10.8, otherwise unremarkable.   EKG 11/11/22: NSR with sinus arrhythmia, rate 73.  Event monitor 03/2021: ~28 days of data recorded on [employer] monitor. Patient had a min HR of 50 bpm, max HR of 126 bpm, and avg HR of 77 bpm. Predominant underlying rhythm was Sinus Rhythm. No VT, SVT, atrial fibrillation, high degree block, or pauses noted. Isolated atrial and ventricular ectopy was rare (<5%). There were 4 triggered events, sinus rhythm. No significant arrhythmias detected.   TEE 02/16/21: 1. Left ventricular ejection fraction, by estimation, is 60 to 65%. The  left ventricle has normal function.   2. Right ventricular systolic function is normal. The right ventricular  size is normal.   3. No left atrial/left atrial appendage thrombus was detected.   4. The mitral valve is normal in structure. Trivial mitral valve  regurgitation.   5. The aortic valve is tricuspid. Aortic valve regurgitation is trivial.  No aortic stenosis is present.   6. There is mild (Grade II) plaque.   7. Agitated saline  contrast bubble study was negative, with no evidence  of any interatrial shunt.   TTE 02/14/21:  1. Left ventricular ejection fraction, by estimation, is 65 to 70%. The  left ventricle has normal function. The left ventricle has no regional  wall motion abnormalities. Left ventricular diastolic parameters were  normal.   2. Right ventricular systolic function is normal. The right  ventricular  size is normal. There is normal pulmonary artery systolic pressure.   3. Left atrial size was mildly dilated.   4. The mitral valve is normal in structure. Mild mitral valve  regurgitation. No evidence of mitral stenosis.   5. The aortic valve is tricuspid. Aortic valve regurgitation is not  visualized. No aortic stenosis is present.   6. The inferior vena cava is normal in size with greater than 50%  respiratory variability, suggesting right atrial pressure of 3 mmHg.   )         Anesthesia Quick Evaluation

## 2022-11-14 NOTE — Progress Notes (Signed)
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  Pt was evaluated by cardiologist Dr. Cristal Deer following nontraumatic ICH in November of 2022. No afib seen on monitor, no shunt seen on TEE. She was last seen 04/19/21 and doing well at that time, recommended to continue current medical management and followup with cardiology on an as needed basis.   Preop lab reviewed, mild anemia with HGB 10.8, otherwise unremarkable.   EKG 11/11/22: NSR with sinus arrhythmia, rate 73.  Event monitor 03/2021: ~28 days of data recorded on [employer] monitor. Patient had a min HR of 50 bpm, max HR of 126 bpm, and avg HR of 77 bpm. Predominant underlying rhythm was Sinus Rhythm. No VT, SVT, atrial fibrillation, high degree block, or pauses noted. Isolated atrial and ventricular ectopy was rare (<5%). There were 4 triggered events, sinus rhythm. No significant arrhythmias detected.   TEE 02/16/21: 1. Left ventricular ejection fraction, by estimation, is 60 to 65%. The  left ventricle has normal function.   2. Right ventricular systolic function is normal. The right ventricular  size is normal.   3. No left atrial/left atrial appendage thrombus was detected.   4. The mitral valve is normal in structure. Trivial mitral valve  regurgitation.   5. The aortic valve is tricuspid. Aortic valve regurgitation is trivial.  No aortic stenosis is present.   6. There is mild (Grade II) plaque.   7. Agitated saline contrast bubble study was negative, with no evidence  of any interatrial shunt.   TTE 02/14/21:  1. Left ventricular ejection fraction, by estimation, is 65 to 70%. The  left ventricle has normal function. The left ventricle has no regional  wall motion abnormalities. Left ventricular diastolic parameters were  normal.   2. Right ventricular systolic function is normal. The right ventricular  size is  normal. There is normal pulmonary artery systolic pressure.   3. Left atrial size was mildly dilated.   4. The mitral valve is normal in structure. Mild mitral valve  regurgitation. No evidence of mitral stenosis.   5. The aortic valve is tricuspid. Aortic valve regurgitation is not  visualized. No aortic stenosis is present.   6. The inferior vena cava is normal in size with greater than 50%  respiratory variability, suggesting right atrial pressure of 3 mmHg.     Zannie Cove [facility] Short Stay Center/Anesthesiology Phone [phone] 11/14/2022 2:09 PM

## 2022-11-16 NOTE — Telephone Encounter (Signed)
This form has been signed.

## 2022-11-17 NOTE — Telephone Encounter (Signed)
Form has been faxed to [phone]

## 2022-11-21 ENCOUNTER — Telehealth: Payer: Self-pay | Admitting: Orthopedic Surgery

## 2022-11-21 NOTE — Telephone Encounter (Signed)
Patient called and said that she know that yall said stop taking all the medication but she needs of her meds. And can she take her potassium pills. CB#[phone]

## 2022-11-22 ENCOUNTER — Telehealth: Payer: Self-pay | Admitting: *Deleted

## 2022-11-22 ENCOUNTER — Telehealth: Payer: Self-pay | Admitting: Orthopedic Surgery

## 2022-11-22 ENCOUNTER — Other Ambulatory Visit: Payer: Self-pay | Admitting: Surgical

## 2022-11-22 MED ORDER — TRAMADOL HCL 50 MG PO TABS: 50.0000 mg | ORAL_TABLET | Freq: Every day | ORAL | 0 refills | Status: DC | PRN

## 2022-11-22 NOTE — Telephone Encounter (Signed)
[facility] pre-op call completed. 

## 2022-11-22 NOTE — Telephone Encounter (Signed)
Sent in tramadol refill. I would recommend continuing her potassium supplement as normal for her

## 2022-11-22 NOTE — Telephone Encounter (Signed)
Lvm advising pt.

## 2022-11-22 NOTE — Progress Notes (Addendum)
Anesthesia Review:  PCP: Hillard Danker- LOV 09/22/22.   Cardiologist : none  Chest x-ray : EKG : 11/11/22  Echo : 2022  Stress test: Cardiac Cath :  Activity level:  can do a flgiht of stairs wtihout difficuty  Sleep Study/ CPAP : has cpap  Fasting Blood Sugar :      / Checks Blood Sugar -- times a day:   Blood Thinner/ Instructions /Last Dose: ASA / Instructions/ Last Dose :    Preop done on 11/11/22 Labs done of u/a, cbc, bmp and pcr.     Called and LVMM on [phone] and [phone].  To review preop instructions for [facility].   PT called back on 11/22/22  and reviewed with pt  update on locaiton at [facility], date and time of surgery and arrival time.  No new updates in med hx per pt.   Pt aware to arrive at 0515 at [facility].  PT aware clear liquids untl 0430am.  Nothing after 0430am.  PT did not receive preop Ensure dirnk at preop appt and lives in [place].  Instructed pt on 11/22/22 to drink whatever on clear liquid list on preop instructions morning of surgery until 0430am.  PT voiced understanding.  PT had questoins in regards to Liquid Magnesium she takes for cramps and Potassium supplement she takes .  Instructed pt to call Dr August Saucer office and tell them she takes Potassium due to Tenoretic and Magnesium for leg cramps.  PT voiced understanding.

## 2022-11-22 NOTE — Telephone Encounter (Signed)
Patient returned call asked if she need to stop taking liquid Magnesium?  The number to contact patient is [phone]

## 2022-11-22 NOTE — Telephone Encounter (Signed)
No, ok to continue taking it as normal

## 2022-11-22 NOTE — Care Plan (Signed)
[facility] RNCM call to patient to discuss her upcoming Left total knee arthroplasty with Dr. August Saucer on 11/29/22. She is an Ortho bundle patient through [facility] and is agreeable to case management. She will need a home CPM, which has been ordered by [employer] and will be delivered to her house tomorrow. She lives with her husband, who does work some in the mornings, but is going to have other family assisting her as needed after discharge. Anticipate HHPT will be needed after short hospital stay. Referral made to [facility] after choice provided. Reviewed all post op care instructions. Will continue to follow for needs.

## 2022-11-22 NOTE — Telephone Encounter (Signed)
Called and advised.

## 2022-11-25 DIAGNOSIS — L989 Disorder of the skin and subcutaneous tissue, unspecified: Secondary | ICD-10-CM | POA: Diagnosis not present

## 2022-11-28 MED ORDER — TRANEXAMIC ACID 1000 MG/10ML IV SOLN
2000.0000 mg | INTRAVENOUS | Status: DC
  Filled 2022-11-28: qty 20

## 2022-11-29 ENCOUNTER — Ambulatory Visit (HOSPITAL_COMMUNITY): Payer: Medicare Other | Admitting: Physician Assistant

## 2022-11-29 ENCOUNTER — Encounter (HOSPITAL_COMMUNITY): Payer: Self-pay | Admitting: Orthopedic Surgery

## 2022-11-29 ENCOUNTER — Other Ambulatory Visit: Payer: Self-pay

## 2022-11-29 ENCOUNTER — Encounter (HOSPITAL_COMMUNITY)
Admission: RE | Disposition: A | Discharge status: Home Health | Payer: Self-pay | Source: Ambulatory Visit | Attending: Orthopedic Surgery

## 2022-11-29 ENCOUNTER — Inpatient Hospital Stay (HOSPITAL_COMMUNITY)
Admission: RE | Admit: 2022-11-29 | Discharge: 2022-12-02 | DRG: 470 | Disposition: A | Discharge status: Home Health | Payer: Medicare Other | Source: Ambulatory Visit | Attending: Orthopedic Surgery | Admitting: Orthopedic Surgery

## 2022-11-29 DIAGNOSIS — Z91012 Allergy to eggs: Secondary | ICD-10-CM

## 2022-11-29 DIAGNOSIS — Z833 Family history of diabetes mellitus: Secondary | ICD-10-CM | POA: Diagnosis not present

## 2022-11-29 DIAGNOSIS — M1712 Unilateral primary osteoarthritis, left knee: Secondary | ICD-10-CM

## 2022-11-29 DIAGNOSIS — Z8249 Family history of ischemic heart disease and other diseases of the circulatory system: Secondary | ICD-10-CM | POA: Diagnosis not present

## 2022-11-29 DIAGNOSIS — E7800 Pure hypercholesterolemia, unspecified: Secondary | ICD-10-CM | POA: Diagnosis present

## 2022-11-29 DIAGNOSIS — Z96651 Presence of right artificial knee joint: Secondary | ICD-10-CM | POA: Diagnosis present

## 2022-11-29 DIAGNOSIS — Z91013 Allergy to seafood: Secondary | ICD-10-CM

## 2022-11-29 DIAGNOSIS — Z96612 Presence of left artificial shoulder joint: Secondary | ICD-10-CM | POA: Diagnosis present

## 2022-11-29 DIAGNOSIS — M8580 Other specified disorders of bone density and structure, unspecified site: Secondary | ICD-10-CM | POA: Diagnosis present

## 2022-11-29 DIAGNOSIS — I69354 Hemiplegia and hemiparesis following cerebral infarction affecting left non-dominant side: Secondary | ICD-10-CM

## 2022-11-29 DIAGNOSIS — K219 Gastro-esophageal reflux disease without esophagitis: Secondary | ICD-10-CM | POA: Diagnosis present

## 2022-11-29 DIAGNOSIS — G8929 Other chronic pain: Secondary | ICD-10-CM | POA: Diagnosis present

## 2022-11-29 DIAGNOSIS — Z79899 Other long term (current) drug therapy: Secondary | ICD-10-CM

## 2022-11-29 DIAGNOSIS — Z888 Allergy status to other drugs, medicaments and biological substances status: Secondary | ICD-10-CM | POA: Diagnosis not present

## 2022-11-29 DIAGNOSIS — Z7982 Long term (current) use of aspirin: Secondary | ICD-10-CM

## 2022-11-29 DIAGNOSIS — Z823 Family history of stroke: Secondary | ICD-10-CM

## 2022-11-29 DIAGNOSIS — Z91018 Allergy to other foods: Secondary | ICD-10-CM | POA: Diagnosis not present

## 2022-11-29 DIAGNOSIS — Z8719 Personal history of other diseases of the digestive system: Secondary | ICD-10-CM | POA: Diagnosis not present

## 2022-11-29 DIAGNOSIS — G8918 Other acute postprocedural pain: Secondary | ICD-10-CM | POA: Diagnosis not present

## 2022-11-29 DIAGNOSIS — G4733 Obstructive sleep apnea (adult) (pediatric): Secondary | ICD-10-CM | POA: Diagnosis present

## 2022-11-29 DIAGNOSIS — M549 Dorsalgia, unspecified: Secondary | ICD-10-CM | POA: Diagnosis present

## 2022-11-29 DIAGNOSIS — Z043 Encounter for examination and observation following other accident: Secondary | ICD-10-CM | POA: Diagnosis not present

## 2022-11-29 DIAGNOSIS — Z96652 Presence of left artificial knee joint: Secondary | ICD-10-CM

## 2022-11-29 DIAGNOSIS — Z01818 Encounter for other preprocedural examination: Principal | ICD-10-CM

## 2022-11-29 HISTORY — PX: TOTAL KNEE ARTHROPLASTY: SHX125

## 2022-11-29 SURGERY — ARTHROPLASTY, KNEE, TOTAL
Anesthesia: Spinal | Site: Knee | Laterality: Left

## 2022-11-29 MED ORDER — POVIDONE-IODINE 10 % EX SWAB: 2.0000 | Freq: Once | CUTANEOUS | Status: DC

## 2022-11-29 MED ORDER — CLONIDINE HCL (ANALGESIA) 100 MCG/ML EP SOLN
EPIDURAL | Status: DC | PRN
  Administered 2022-11-29: 100 ug

## 2022-11-29 MED ORDER — BUPIVACAINE-EPINEPHRINE (PF) 0.5% -1:200000 IJ SOLN
INTRAMUSCULAR | Status: DC | PRN
  Administered 2022-11-29: 15 mL via PERINEURAL

## 2022-11-29 MED ORDER — HYDROCHLOROTHIAZIDE 25 MG PO TABS
25.0000 mg | ORAL_TABLET | Freq: Every day | ORAL | Status: DC
  Administered 2022-11-30 – 2022-12-02 (×3): 25 mg via ORAL
  Filled 2022-11-29 (×3): qty 1

## 2022-11-29 MED ORDER — MIDAZOLAM HCL 5 MG/5ML IJ SOLN
INTRAMUSCULAR | Status: DC | PRN
  Administered 2022-11-29 (×2): 1 mg via INTRAVENOUS

## 2022-11-29 MED ORDER — ONDANSETRON HCL 4 MG/2ML IJ SOLN: 4.0000 mg | Freq: Four times a day (QID) | INTRAMUSCULAR | Status: DC | PRN

## 2022-11-29 MED ORDER — VANCOMYCIN HCL 1 G IV SOLR
INTRAVENOUS | Status: DC | PRN
  Administered 2022-11-29: 1000 mg via TOPICAL

## 2022-11-29 MED ORDER — DIPHENOXYLATE-ATROPINE 2.5-0.025 MG PO TABS: 1.0000 | ORAL_TABLET | Freq: Four times a day (QID) | ORAL | Status: DC | PRN

## 2022-11-29 MED ORDER — MAGNESIUM 200 MG PO TABS: 1450.0000 mg | ORAL_TABLET | Freq: Every day | ORAL | Status: DC

## 2022-11-29 MED ORDER — CELECOXIB 100 MG PO CAPS
100.0000 mg | ORAL_CAPSULE | Freq: Two times a day (BID) | ORAL | Status: DC
  Administered 2022-11-29 – 2022-12-02 (×6): 100 mg via ORAL
  Filled 2022-11-29 (×7): qty 1

## 2022-11-29 MED ORDER — BUPIVACAINE LIPOSOME 1.3 % IJ SUSP
INTRAMUSCULAR | Status: AC
  Filled 2022-11-29: qty 20

## 2022-11-29 MED ORDER — METHOCARBAMOL 500 MG IVPB - SIMPLE MED
500.0000 mg | Freq: Four times a day (QID) | INTRAVENOUS | Status: DC | PRN
  Administered 2022-11-29: 500 mg via INTRAVENOUS

## 2022-11-29 MED ORDER — ONDANSETRON HCL 4 MG PO TABS: 4.0000 mg | ORAL_TABLET | Freq: Four times a day (QID) | ORAL | Status: DC | PRN

## 2022-11-29 MED ORDER — ATENOLOL-CHLORTHALIDONE 50-25 MG PO TABS: 1.0000 | ORAL_TABLET | Freq: Every day | ORAL | Status: DC

## 2022-11-29 MED ORDER — FENTANYL CITRATE (PF) 100 MCG/2ML IJ SOLN
INTRAMUSCULAR | Status: DC | PRN
  Administered 2022-11-29 (×2): 25 ug via INTRAVENOUS
  Administered 2022-11-29: 50 ug via INTRAVENOUS

## 2022-11-29 MED ORDER — CEFAZOLIN SODIUM-DEXTROSE 2-4 GM/100ML-% IV SOLN
2.0000 g | INTRAVENOUS | Status: AC
  Administered 2022-11-29: 2 g via INTRAVENOUS
  Filled 2022-11-29: qty 100

## 2022-11-29 MED ORDER — MIDAZOLAM HCL 2 MG/2ML IJ SOLN
INTRAMUSCULAR | Status: AC
  Filled 2022-11-29: qty 2

## 2022-11-29 MED ORDER — ACETAMINOPHEN 500 MG PO TABS
1000.0000 mg | ORAL_TABLET | Freq: Four times a day (QID) | ORAL | Status: AC
  Administered 2022-11-29 – 2022-11-30 (×4): 1000 mg via ORAL
  Filled 2022-11-29 (×4): qty 2

## 2022-11-29 MED ORDER — ORAL CARE MOUTH RINSE: 15.0000 mL | Freq: Once | OROMUCOSAL | Status: AC

## 2022-11-29 MED ORDER — LACTATED RINGERS IV SOLN: INTRAVENOUS | Status: DC

## 2022-11-29 MED ORDER — MORPHINE SULFATE (PF) 4 MG/ML IV SOLN
INTRAVENOUS | Status: AC
  Filled 2022-11-29: qty 2

## 2022-11-29 MED ORDER — TRANEXAMIC ACID 1000 MG/10ML IV SOLN
INTRAVENOUS | Status: DC | PRN
  Administered 2022-11-29: 2000 mg via TOPICAL

## 2022-11-29 MED ORDER — MENTHOL 3 MG MT LOZG: 1.0000 | LOZENGE | OROMUCOSAL | Status: DC | PRN

## 2022-11-29 MED ORDER — SODIUM CHLORIDE 0.9 % IR SOLN
Status: DC | PRN
  Administered 2022-11-29: 3000 mL
  Administered 2022-11-29: 4000 mL

## 2022-11-29 MED ORDER — VITAMIN D 25 MCG (1000 UNIT) PO TABS
5000.0000 [IU] | ORAL_TABLET | Freq: Every day | ORAL | Status: DC
  Administered 2022-11-30 – 2022-12-02 (×3): 5000 [IU] via ORAL
  Filled 2022-11-29 (×3): qty 5

## 2022-11-29 MED ORDER — OXYCODONE HCL 5 MG PO TABS
5.0000 mg | ORAL_TABLET | ORAL | Status: DC | PRN
  Administered 2022-11-29: 5 mg via ORAL
  Administered 2022-11-30 – 2022-12-02 (×10): 10 mg via ORAL
  Filled 2022-11-29 (×11): qty 2

## 2022-11-29 MED ORDER — CHLORHEXIDINE GLUCONATE 0.12 % MT SOLN
15.0000 mL | Freq: Once | OROMUCOSAL | Status: AC
  Administered 2022-11-29: 15 mL via OROMUCOSAL

## 2022-11-29 MED ORDER — BUPIVACAINE LIPOSOME 1.3 % IJ SUSP
INTRAMUSCULAR | Status: DC | PRN
  Administered 2022-11-29: 20 mL

## 2022-11-29 MED ORDER — ACETAMINOPHEN 500 MG PO TABS
1000.0000 mg | ORAL_TABLET | Freq: Once | ORAL | Status: AC
  Administered 2022-11-29: 1000 mg via ORAL
  Filled 2022-11-29: qty 2

## 2022-11-29 MED ORDER — MELATONIN 3 MG PO TABS
3.0000 mg | ORAL_TABLET | Freq: Every day | ORAL | Status: DC
  Administered 2022-11-29 – 2022-12-01 (×3): 3 mg via ORAL
  Filled 2022-11-29 (×3): qty 1

## 2022-11-29 MED ORDER — OXYCODONE HCL 5 MG/5ML PO SOLN: 5.0000 mg | Freq: Once | ORAL | Status: DC | PRN

## 2022-11-29 MED ORDER — CLONIDINE HCL (ANALGESIA) 100 MCG/ML EP SOLN
EPIDURAL | Status: AC
  Filled 2022-11-29: qty 10

## 2022-11-29 MED ORDER — METOCLOPRAMIDE HCL 5 MG PO TABS: 5.0000 mg | ORAL_TABLET | Freq: Three times a day (TID) | ORAL | Status: DC | PRN

## 2022-11-29 MED ORDER — PROPOFOL 10 MG/ML IV BOLUS
INTRAVENOUS | Status: DC | PRN
Start: 2022-11-29 — End: 2022-11-29
  Administered 2022-11-29: 50 mg via INTRAVENOUS
  Administered 2022-11-29: 20 mg via INTRAVENOUS
  Administered 2022-11-29: 30 mg via INTRAVENOUS

## 2022-11-29 MED ORDER — VANCOMYCIN HCL 1000 MG IV SOLR
INTRAVENOUS | Status: AC
  Filled 2022-11-29: qty 20

## 2022-11-29 MED ORDER — METOCLOPRAMIDE HCL 5 MG/ML IJ SOLN: 5.0000 mg | Freq: Three times a day (TID) | INTRAMUSCULAR | Status: DC | PRN

## 2022-11-29 MED ORDER — ISOPROPYL ALCOHOL 70 % SOLN
Status: DC | PRN
  Administered 2022-11-29: 1 via TOPICAL

## 2022-11-29 MED ORDER — METHOCARBAMOL 500 MG PO TABS
500.0000 mg | ORAL_TABLET | Freq: Four times a day (QID) | ORAL | Status: DC | PRN
  Administered 2022-11-30 – 2022-12-02 (×5): 500 mg via ORAL
  Filled 2022-11-29 (×4): qty 1

## 2022-11-29 MED ORDER — OXYCODONE HCL 5 MG PO TABS: 5.0000 mg | ORAL_TABLET | Freq: Once | ORAL | Status: DC | PRN

## 2022-11-29 MED ORDER — METHOCARBAMOL 500 MG IVPB - SIMPLE MED
INTRAVENOUS | Status: AC
  Filled 2022-11-29: qty 55

## 2022-11-29 MED ORDER — ATENOLOL 50 MG PO TABS
50.0000 mg | ORAL_TABLET | Freq: Every day | ORAL | Status: DC
  Administered 2022-11-30 – 2022-12-02 (×3): 50 mg via ORAL
  Filled 2022-11-29 (×3): qty 1

## 2022-11-29 MED ORDER — CLONIDINE HCL (ANALGESIA) 100 MCG/ML EP SOLN: 150.0000 ug | Freq: Once | EPIDURAL | Status: DC

## 2022-11-29 MED ORDER — POTASSIUM CHLORIDE CRYS ER 20 MEQ PO TBCR
20.0000 meq | EXTENDED_RELEASE_TABLET | Freq: Two times a day (BID) | ORAL | Status: DC
  Administered 2022-11-29 – 2022-12-02 (×6): 20 meq via ORAL
  Filled 2022-11-29 (×6): qty 1

## 2022-11-29 MED ORDER — PHENOL 1.4 % MT LIQD: 1.0000 | OROMUCOSAL | Status: DC | PRN

## 2022-11-29 MED ORDER — MORPHINE SULFATE 4 MG/ML IJ SOLN
INTRAMUSCULAR | Status: DC | PRN
  Administered 2022-11-29: 8 mg

## 2022-11-29 MED ORDER — PROPOFOL 500 MG/50ML IV EMUL
INTRAVENOUS | Status: DC | PRN
  Administered 2022-11-29: 50 ug/kg/min via INTRAVENOUS

## 2022-11-29 MED ORDER — ACETAMINOPHEN 325 MG PO TABS: 325.0000 mg | ORAL_TABLET | Freq: Four times a day (QID) | ORAL | Status: DC | PRN

## 2022-11-29 MED ORDER — ASPIRIN 81 MG PO CHEW
81.0000 mg | CHEWABLE_TABLET | Freq: Two times a day (BID) | ORAL | Status: DC
  Administered 2022-11-29 – 2022-12-02 (×6): 81 mg via ORAL
  Filled 2022-11-29 (×6): qty 1

## 2022-11-29 MED ORDER — SODIUM CHLORIDE 0.9% FLUSH
INTRAVENOUS | Status: DC | PRN
  Administered 2022-11-29: 20 mL

## 2022-11-29 MED ORDER — FENTANYL CITRATE PF 50 MCG/ML IJ SOSY
PREFILLED_SYRINGE | INTRAMUSCULAR | Status: AC
  Filled 2022-11-29: qty 1

## 2022-11-29 MED ORDER — CEFAZOLIN SODIUM-DEXTROSE 1-4 GM/50ML-% IV SOLN
1.0000 g | Freq: Three times a day (TID) | INTRAVENOUS | Status: AC
  Administered 2022-11-29 (×2): 1 g via INTRAVENOUS
  Filled 2022-11-29 (×2): qty 50

## 2022-11-29 MED ORDER — PHENYLEPHRINE HCL-NACL 20-0.9 MG/250ML-% IV SOLN
INTRAVENOUS | Status: DC | PRN
  Administered 2022-11-29: 30 ug/min via INTRAVENOUS

## 2022-11-29 MED ORDER — STERILE WATER FOR IRRIGATION IR SOLN
Status: DC | PRN
  Administered 2022-11-29: 2000 mL

## 2022-11-29 MED ORDER — FENTANYL CITRATE PF 50 MCG/ML IJ SOSY
PREFILLED_SYRINGE | INTRAMUSCULAR | Status: AC
  Filled 2022-11-29: qty 2

## 2022-11-29 MED ORDER — SODIUM CHLORIDE 0.9 % IV SOLN: INTRAVENOUS | Status: AC

## 2022-11-29 MED ORDER — BUPIVACAINE IN DEXTROSE 0.75-8.25 % IT SOLN
INTRATHECAL | Status: DC | PRN
  Administered 2022-11-29: 2 mL via INTRATHECAL

## 2022-11-29 MED ORDER — BUPIVACAINE-EPINEPHRINE 0.25% -1:200000 IJ SOLN
INTRAMUSCULAR | Status: DC | PRN
Start: 2022-11-29 — End: 2022-11-29
  Administered 2022-11-29: 20 mL

## 2022-11-29 MED ORDER — DEXAMETHASONE SODIUM PHOSPHATE 10 MG/ML IJ SOLN
INTRAMUSCULAR | Status: AC
  Filled 2022-11-29: qty 1

## 2022-11-29 MED ORDER — FENTANYL CITRATE PF 50 MCG/ML IJ SOSY
25.0000 ug | PREFILLED_SYRINGE | INTRAMUSCULAR | Status: DC | PRN
  Administered 2022-11-29 (×3): 50 ug via INTRAVENOUS

## 2022-11-29 MED ORDER — POVIDONE-IODINE 7.5 % EX SOLN: Freq: Once | CUTANEOUS | Status: DC

## 2022-11-29 MED ORDER — SODIUM CHLORIDE (PF) 0.9 % IJ SOLN
INTRAMUSCULAR | Status: AC
  Filled 2022-11-29: qty 20

## 2022-11-29 MED ORDER — DOCUSATE SODIUM 100 MG PO CAPS
100.0000 mg | ORAL_CAPSULE | Freq: Two times a day (BID) | ORAL | Status: DC
  Administered 2022-11-29 – 2022-12-02 (×6): 100 mg via ORAL
  Filled 2022-11-29 (×6): qty 1

## 2022-11-29 MED ORDER — BUPIVACAINE-EPINEPHRINE 0.25% -1:200000 IJ SOLN
INTRAMUSCULAR | Status: AC
  Filled 2022-11-29: qty 1

## 2022-11-29 MED ORDER — ONDANSETRON HCL 4 MG/2ML IJ SOLN
INTRAMUSCULAR | Status: AC
  Filled 2022-11-29: qty 2

## 2022-11-29 MED ORDER — HYDROMORPHONE HCL 1 MG/ML IJ SOLN
0.5000 mg | INTRAMUSCULAR | Status: DC | PRN
  Administered 2022-12-01: 0.5 mg via INTRAVENOUS
  Filled 2022-11-29: qty 0.5

## 2022-11-29 MED ORDER — PROPOFOL 1000 MG/100ML IV EMUL
INTRAVENOUS | Status: AC
  Filled 2022-11-29: qty 100

## 2022-11-29 MED ORDER — DROPERIDOL 2.5 MG/ML IJ SOLN: 0.6250 mg | Freq: Once | INTRAMUSCULAR | Status: DC | PRN

## 2022-11-29 MED ORDER — MAGNESIUM OXIDE -MG SUPPLEMENT 400 (240 MG) MG PO TABS
1400.0000 mg | ORAL_TABLET | Freq: Every day | ORAL | Status: DC
  Administered 2022-11-29 – 2022-12-02 (×4): 1400 mg via ORAL
  Filled 2022-11-29 (×4): qty 4

## 2022-11-29 MED ORDER — DEXAMETHASONE SODIUM PHOSPHATE 10 MG/ML IJ SOLN
INTRAMUSCULAR | Status: DC | PRN
  Administered 2022-11-29: 10 mg via INTRAVENOUS

## 2022-11-29 MED ORDER — TRANEXAMIC ACID-NACL 1000-0.7 MG/100ML-% IV SOLN
1000.0000 mg | INTRAVENOUS | Status: AC
  Administered 2022-11-29: 1000 mg via INTRAVENOUS
  Filled 2022-11-29: qty 100

## 2022-11-29 MED ORDER — FENTANYL CITRATE (PF) 100 MCG/2ML IJ SOLN
INTRAMUSCULAR | Status: AC
  Filled 2022-11-29: qty 2

## 2022-11-29 MED ORDER — ONDANSETRON HCL 4 MG/2ML IJ SOLN
INTRAMUSCULAR | Status: DC | PRN
  Administered 2022-11-29: 4 mg via INTRAVENOUS

## 2022-11-29 SURGICAL SUPPLY — 73 items
BAG DECANTER FOR FLEXI CONT (MISCELLANEOUS) ×1 IMPLANT
BLADE HEX COATED 2.75 (ELECTRODE) ×1 IMPLANT
BLADE SAG 18X100X1.27 (BLADE) ×1 IMPLANT
BLADE SURG 15 STRL LF DISP TIS (BLADE) ×1 IMPLANT
BLADE SURG 15 STRL SS (BLADE) ×1
BNDG CMPR 5X6 CHSV STRCH STRL (GAUZE/BANDAGES/DRESSINGS) ×1
BNDG CMPR MED 10X6 ELC LF (GAUZE/BANDAGES/DRESSINGS) ×1
BNDG COHESIVE 6X5 TAN ST LF (GAUZE/BANDAGES/DRESSINGS) ×1 IMPLANT
BNDG ELASTIC 6X10 VLCR STRL LF (GAUZE/BANDAGES/DRESSINGS) ×1 IMPLANT
BOWL SMART MIX CTS (DISPOSABLE) IMPLANT
BSPLAT TIB 5D D CMNT STM LT (Knees) ×1 IMPLANT
CEMENT BONE SIMPLEX SPEEDSET (Cement) IMPLANT
COMP MED POLY AS PERS S6-7 12 (Joint) ×1 IMPLANT
COMPONENT MED PLY PERSS6-7 12 (Joint) IMPLANT
COOLER ICEMAN CLASSIC (MISCELLANEOUS) IMPLANT
COVER SURGICAL LIGHT HANDLE (MISCELLANEOUS) ×1 IMPLANT
CUFF TOURN SGL QUICK 34 (TOURNIQUET CUFF) ×1
CUFF TRNQT CYL 34X4.125X (TOURNIQUET CUFF) ×1 IMPLANT
DRAPE INCISE IOBAN 66X45 STRL (DRAPES) ×3 IMPLANT
DRAPE ORTHO SPLIT 77X108 STRL (DRAPES) ×2
DRAPE SHEET LG 3/4 BI-LAMINATE (DRAPES) ×1 IMPLANT
DRAPE SURG ORHT 6 SPLT 77X108 (DRAPES) ×2 IMPLANT
DRAPE U-SHAPE 47X51 STRL (DRAPES) ×1 IMPLANT
DRSG AQUACEL AG ADV 3.5X14 (GAUZE/BANDAGES/DRESSINGS) IMPLANT
DURAPREP 26ML APPLICATOR (WOUND CARE) ×2 IMPLANT
ELECT REM PT RETURN 15FT ADLT (MISCELLANEOUS) ×1 IMPLANT
FEMUR CMT CR STD SZ 6 LT KNEE (Joint) ×1 IMPLANT
FEMUR CMTD CR STD SZ 6 LT KNEE (Joint) IMPLANT
GAUZE SPONGE 4X4 12PLY STRL (GAUZE/BANDAGES/DRESSINGS) ×1 IMPLANT
GLOVE BIO SURGEON STRL SZ7 (GLOVE) ×1 IMPLANT
GLOVE BIOGEL PI IND STRL 7.0 (GLOVE) ×1 IMPLANT
GLOVE BIOGEL PI IND STRL 8 (GLOVE) ×1 IMPLANT
GLOVE SURG ORTHO 8.0 STRL STRW (GLOVE) ×1 IMPLANT
GOWN STRL REUS W/ TWL LRG LVL3 (GOWN DISPOSABLE) ×2 IMPLANT
GOWN STRL REUS W/TWL LRG LVL3 (GOWN DISPOSABLE) ×2
HANDPIECE INTERPULSE COAX TIP (DISPOSABLE) ×1
HOOD PEEL AWAY T7 (MISCELLANEOUS) ×4 IMPLANT
IMMOBILIZER KNEE 20 (SOFTGOODS) ×1
IMMOBILIZER KNEE 20 THIGH 36 (SOFTGOODS) IMPLANT
JET LAVAGE IRRISEPT WOUND (IRRIGATION / IRRIGATOR) ×1
KIT TURNOVER KIT A (KITS) IMPLANT
LAVAGE JET IRRISEPT WOUND (IRRIGATION / IRRIGATOR) ×1 IMPLANT
LINER TIB PS CD/3-9 12 LT (Liner) ×1 IMPLANT
MANIFOLD NEPTUNE II (INSTRUMENTS) ×1 IMPLANT
NDL HYPO 22X1.5 SAFETY MO (MISCELLANEOUS) ×2 IMPLANT
NDL SAFETY ECLIP 18X1.5 (MISCELLANEOUS) ×1 IMPLANT
NEEDLE HYPO 22X1.5 SAFETY MO (MISCELLANEOUS) ×2
NS IRRIG 1000ML POUR BTL (IV SOLUTION) ×1 IMPLANT
PACK TOTAL KNEE CUSTOM (KITS) ×1 IMPLANT
PAD COLD SHLDR UNI XL WRAP-ON (PAD) ×1
PAD COLD UNI XL WRAP-ON (PAD) IMPLANT
PADDING CAST COTTON 6X4 STRL (CAST SUPPLIES) IMPLANT
PIN DRILL HDLS TROCAR 75 4PK (PIN) IMPLANT
PROTECTOR NERVE ULNAR (MISCELLANEOUS) ×1 IMPLANT
SCREW FEMALE HEX FIX 25X2.5 (ORTHOPEDIC DISPOSABLE SUPPLIES) IMPLANT
SET HNDPC FAN SPRY TIP SCT (DISPOSABLE) ×1 IMPLANT
SOL SCRUB PVP POV-IOD 4OZ 7.5% (MISCELLANEOUS) ×1
SOLUTION SCRB POV-IOD 4OZ 7.5% (MISCELLANEOUS) ×1 IMPLANT
STEM POLY PAT PLY 32M KNEE (Knees) IMPLANT
STEM TIBIA 5 DEG SZ D L KNEE (Knees) IMPLANT
STRIP CLOSURE SKIN 1/2X4 (GAUZE/BANDAGES/DRESSINGS) ×2 IMPLANT
SUT ETHILON 3 0 PS 1 (SUTURE) ×3 IMPLANT
SUT MNCRL AB 3-0 PS2 18 (SUTURE) ×1 IMPLANT
SUT VIC AB 0 CT1 36 (SUTURE) ×4 IMPLANT
SUT VIC AB 1 CT1 36 (SUTURE) ×4 IMPLANT
SUT VIC AB 2-0 CT1 27 (SUTURE) ×4
SUT VIC AB 2-0 CT1 TAPERPNT 27 (SUTURE) ×4 IMPLANT
SYR 20ML LL LF (SYRINGE) ×1 IMPLANT
SYR 30ML LL (SYRINGE) ×3 IMPLANT
TIBIA STEM 5 DEG SZ D L KNEE (Knees) ×1 IMPLANT
TOWEL OR 17X26 10 PK STRL BLUE (TOWEL DISPOSABLE) ×3 IMPLANT
TRAY CATH INTERMITTENT SS 16FR (CATHETERS) IMPLANT
WATER STERILE IRR 1000ML POUR (IV SOLUTION) ×2 IMPLANT

## 2022-11-29 NOTE — Anesthesia Procedure Notes (Signed)
Anesthesia Regional Block: Adductor canal block   Pre-Anesthetic Checklist: , timeout performed,  Correct Patient, Correct Site, Correct Laterality,  Correct Procedure, Correct Position, site marked,  Risks and benefits discussed,  Pre-op evaluation,  At surgeon's request and post-op pain management  Laterality: Left  Prep: Maximum Sterile Barrier Precautions used, chloraprep       Needles:  Injection technique: Single-shot  Needle Type: Echogenic Stimulator Needle     Needle Length: 9cm  Needle Gauge: 22     Additional Needles:   Procedures:,,,, ultrasound used (permanent image in chart),,    Narrative:  Start time: 11/29/2022 7:06 AM End time: 11/29/2022 7:09 AM Injection made incrementally with aspirations every 5 mL.  Performed by: Personally  Anesthesiologist: Kaylyn Layer, MD  Additional Notes: Risks, benefits, and alternative discussed. Patient gave consent for procedure. Patient prepped and draped in sterile fashion. Sedation administered, patient remains easily responsive to voice. Relevant anatomy identified with ultrasound guidance. Local anesthetic given in 5cc increments with no signs or symptoms of intravascular injection. No pain or paraesthesias with injection. Patient monitored throughout procedure with signs of LAST or immediate complications. Tolerated well. Ultrasound image placed in chart.  Amalia Greenhouse, MD

## 2022-11-29 NOTE — Evaluation (Signed)
Physical Therapy Evaluation Patient Details Name: [name] MRN: [id number] DOB: [birth year] Today's Date: 11/29/2022  History of Present Illness  72 yo female s/p L TKA 11/29/22. Hx of CVA with mild L side residual weakness, R RCR, L rev TSA 2020, back sg, ICH, R TKA 2018, chronic pain  Clinical Impression  On eval POD 0, pt required Mod A to stand x 2 from bed. L knee instability/buckling 2* block, even with KI on. Pt unable to safely take any steps. High fall risk so deferred further mobility and assisted pt back to bed. Pt reported 0 pain/numbness of L LE during session. Will continue to follow and progress activity on tomorrow.        If plan is discharge home, recommend the following: A little help with walking and/or transfers;A little help with bathing/dressing/bathroom;Assistance with cooking/housework;Assist for transportation;Help with stairs or ramp for entrance   Can travel by private vehicle        Equipment Recommendations None recommended by PT  Recommendations for Other Services       Functional Status Assessment Patient has had a recent decline in their functional status and demonstrates the ability to make significant improvements in function in a reasonable and predictable amount of time.     Precautions / Restrictions Precautions Precautions: Fall;Knee Required Braces or Orthoses: Knee Immobilizer - Left Restrictions Weight Bearing Restrictions: No Other Position/Activity Restrictions: WBAT      Mobility  Bed Mobility Overal bed mobility: Needs Assistance Bed Mobility: Supine to Sit, Sit to Supine     Supine to sit: Contact guard, HOB elevated Sit to supine: Min assist, HOB elevated   General bed mobility comments: Min A for L LE. Increased time. Cues for safety, technique. Pt reported some dizziness.    Transfers Overall transfer level: Needs assistance Equipment used: Rolling walker (2 wheels) Transfers: Sit to/from Stand Sit to Stand: Mod  assist, From elevated surface           General transfer comment: Assist to power up, stabilize, control descent. Stood x 2 with poor balance, L knee instabilty 2* block. Pt unable to safely take any steps 2* fall risk. She was able to scoot towards HOB.    Ambulation/Gait                  Stairs            Wheelchair Mobility     Tilt Bed    Modified Rankin (Stroke Patients Only)       Balance Overall balance assessment: Needs assistance         Standing balance support: Bilateral upper extremity supported, During functional activity, Reliant on assistive device for balance                                 Pertinent Vitals/Pain Pain Assessment Pain Assessment: No/denies pain    Home Living Family/patient expects to be discharged to:: Private residence Living Arrangements: Spouse/significant other   Type of Home: House Home Access: Stairs to enter Entrance Stairs-Rails: None Entrance Stairs-Number of Steps: 2+1   Home Layout: Multi-level;Able to live on main level with bedroom/bathroom Home Equipment: Rolling Walker (2 wheels);Cane - single point;Shower seat;BSC/3in1      Prior Function Prior Level of Function : Independent/Modified Independent                     Extremity/Trunk Assessment  Upper Extremity Assessment Upper Extremity Assessment: Overall WFL for tasks assessed    Lower Extremity Assessment Lower Extremity Assessment: Generalized weakness (L knee block not worn off enough-pt reports numbness-L knee ext lag with SLR)       Communication   Communication Communication: No apparent difficulties  Cognition Arousal: Alert Behavior During Therapy: WFL for tasks assessed/performed Overall Cognitive Status: Within Functional Limits for tasks assessed                                          General Comments      Exercises     Assessment/Plan    PT Assessment Patient needs  continued PT services  PT Problem List Decreased strength;Decreased range of motion;Decreased activity tolerance;Decreased balance;Decreased mobility;Decreased knowledge of use of DME;Pain       PT Treatment Interventions DME instruction;Gait training;Functional mobility training;Therapeutic activities;Therapeutic exercise;Balance training;Patient/family education;Stair training    PT Goals (Current goals can be found in the Care Plan section)  Acute Rehab PT Goals Patient Stated Goal: none stated PT Goal Formulation: With patient Time For Goal Achievement: 12/13/22 Potential to Achieve Goals: Good    Frequency Min 1X/week     Co-evaluation               AM-PAC PT "6 Clicks" Mobility  Outcome Measure Help needed turning from your back to your side while in a flat bed without using bedrails?: A Little Help needed moving from lying on your back to sitting on the side of a flat bed without using bedrails?: A Little Help needed moving to and from a bed to a chair (including a wheelchair)?: A Lot Help needed standing up from a chair using your arms (e.g., wheelchair or bedside chair)?: A Lot Help needed to walk in hospital room?: Total Help needed climbing 3-5 steps with a railing? : Total 6 Click Score: 12    End of Session Equipment Utilized During Treatment: Gait belt Activity Tolerance: Patient tolerated treatment well Patient left: with call bell/phone within reach;with bed alarm set   PT Visit Diagnosis: Other abnormalities of gait and mobility (R26.89)    Time: 4098-1191 PT Time Calculation (min) (ACUTE ONLY): 27 min   Charges:   PT Evaluation $PT Eval Low Complexity: 1 Low PT Treatments $Therapeutic Activity: 8-22 mins PT General Charges $$ ACUTE PT VISIT: 1 Visit           Faye Ramsay, PT Acute Rehabilitation  Office: [phone]

## 2022-11-29 NOTE — Brief Op Note (Signed)
   11/29/2022  10:34 AM  PATIENT:  [name]  72 y.o. female  PRE-OPERATIVE DIAGNOSIS:  left knee osteoarthritis  POST-OPERATIVE DIAGNOSIS:  left knee osteoarthritis  PROCEDURE:  Procedure(s): LEFT TOTAL KNEE ARTHROPLASTY  SURGEON:  Surgeon(s): Cammy Copa, MD  ASSISTANT: magnant pa  ANESTHESIA:   spinal  EBL: 25 ml    Total I/O In: 1200 [I.V.:1000; IV Piggyback:200] Out: 820 [Urine:800; Blood:20]  BLOOD ADMINISTERED: none  DRAINS: none   LOCAL MEDICATIONS USED:  marcaine morphine sulfate clonidine vanco powder exparel  SPECIMEN:  No Specimen  COUNTS:  YES  TOURNIQUET:   Total Tourniquet Time Documented: Thigh (Left) - 83 minutes Total: Thigh (Left) - 83 minutes   DICTATION: .Other Dictation: Dictation Number [id number]  PLAN OF CARE: Admit for overnight observation  PATIENT DISPOSITION:  PACU - hemodynamically stable

## 2022-11-29 NOTE — Op Note (Signed)
[name], ESPERICUETA MEDICAL RECORD NO: [id number] ACCOUNT NO: [id number] DATE OF BIRTH: November 16, 1950 FACILITY: [facility] LOCATION: [facility] PHYSICIAN: Graylin Shiver. August Saucer, MD  Operative Report   DATE OF PROCEDURE: 11/29/2022  PREOPERATIVE DIAGNOSIS:  Left knee arthritis.  POSTOPERATIVE DIAGNOSIS:  Left knee arthritis.  PROCEDURE:  Left cemented total knee replacement using Persona size 6 left cruciate retaining cemented standard femur with 5-degree size D natural tibia cemented with 32 mm 3-peg cemented patella and 12 mm medial congruent polyethylene spacer.  SURGEON:  Graylin Shiver. August Saucer, MD  ASSISTANT:  Karenann Cai.  INDICATIONS:  This is a 72 year old patient with end-stage left knee arthritis, who presents for operative management after explanation of risks and benefits.  DESCRIPTION OF PROCEDURE:  The patient was brought to the operating room where spinal anesthetic was induced.  Preoperative antibiotics administered.  Patient's left leg was prescrubbed with alcohol and Betadine, allowed to air dry, prepped with DuraPrep  solution and draped in sterile manner.  Ioban used to cover the operative field.  The patient had about 10 degree flexion contracture, but bent to approximately 115 of flexion.  After calling timeout, the left leg was elevated and exsanguinated with the  Esmarch wrap.  Tourniquet was inflated.  Midline approach to the knee was made.  Skin and subcutaneous tissue were sharply divided.  IrriSept solution utilized.  Median parapatellar arthrotomy was made and marked with #1 Vicryl suture.  The patient had  severe tricompartmental arthritis.  Fat pad partially excised.  Lateral patellofemoral ligament released.  Soft tissue dissection performed on the medial proximal tibia proportionate to the patient's mild varus deformity.  Soft tissue removed from the  anterior distal femur.  ACL released and anterior horn lateral meniscus released.  Retractors placed to protect the collateral  ligaments as well as posterior retractor.  Intramedullary alignment then used to make a cut perpendicular to the mechanical  axis and 5 degrees of posterior tilt.  The bone quality was good, but not good enough for press fit implant.  Initially a 2 mm cut was made off the most affected medial tibial plateau, which was later revised 2 more millimeters.  Now is an order to get  below the defect.  Next, the intramedullary alignment was then used to make a cut 5 degrees valgus on the distal femur.  Because of the patient's preoperative flexion contracture, we took 12 off of the distal femur.  This allowed both the 10 and 12  spacer to sit with near full extension.  The patient's knee sized to a size 6.  Femur was cut in 3 degrees of external rotation, which gave symmetric flexion gap and extension gap.  The anterior, posterior and chamfer cuts were made.  Next, the tibia was  keel punched with the axis alignment with the medial third of the tibial tubercle.  Trial femur was placed.  A 10 and 12 spacer was placed.  A 12 mm spacer gave full extension.  The patella was then cut down from 22 to approximately 12 mm.  The trial  button was placed.  With all trial components in position the patient had full extension, full flexion with good patellar tracking using no thumbs technique.  Good stability to varus and valgus stress at 0, 30 and 90 degrees, At this time, final  preparations were made on both the femur and the tibia.  Trial components were removed.  Thorough irrigation was performed with 3 liters of irrigating solution.  TXA sponge was allowed to  sit in the knee along with IrriSept solution for 3 minutes.  We  also anesthetized the capsule using Marcaine, Exparel and saline.  At this time, two bone plugs were placed and a vancomycin placed in the canals.  The bony surfaces were dried and the implants were cemented into position with excess cement removed.   Next, a cement was allowed to dry.  A 12 mm spacer  was placed and the same stability parameters were maintained.  Tourniquet released, and bleeding points encountered controlled using electrocautery.  Thorough irrigation was then performed with 3 liters  of pouring irrigation.  The arthrotomy was closed over bolster using #1 Vicryl suture.  Prior to final arthrotomy closure we did irrigate the knee out with IrriSept solution, which was then removed and placed vancomycin powder into the knee joint and  then the arthrotomy was completely closed.  IrriSept solution then placed above the arthrotomy along with vancomycin powder and then the incision was closed using 0 Vicryl suture, 2-0 Vicryl suture, and 3-0 Monocryl with Steri-Strips and Aquacel dressing  applied along with the knee immobilizer.  Luke's assistance was required at all times for retraction, opening, closing, mobilization of tissue.  His assistance was a medical necessity.   PUS D: 11/29/2022 10:43:09 am T: 11/29/2022 11:19:00 am  JOB: [id number]/ [id number]

## 2022-11-29 NOTE — H&P (Signed)
TOTAL KNEE ADMISSION H&P  Patient is being admitted for left total knee arthroplasty.  Subjective:  Chief Complaint:left knee pain.  HPI: [name], 72 y.o. female, has a history of pain and functional disability in the left knee due to arthritis and has failed non-surgical conservative treatments for greater than 12 weeks to includeNSAID's and/or analgesics, corticosteriod injections, flexibility and strengthening excercises, use of assistive devices, and activity modification.  Onset of symptoms was gradual, starting 5 years ago with rapidlly worsening course since that time. The patient noted prior procedures on the knee to include  arthroscopy and menisectomy on the left knee(s).  Patient currently rates pain in the left knee(s) at 9 out of 10 with activity. Patient has night pain, worsening of pain with activity and weight bearing, pain that interferes with activities of daily living, pain with passive range of motion, crepitus, and joint swelling.  Patient has evidence of subchondral sclerosis and joint space narrowing by imaging studies. This patient has had  a hard recovery from prior right tka done elsewhere. No personal or family h/o dvt or pe. . There is no active infection.  Patient Active Problem List   Diagnosis Date Noted   Urinary incontinence 03/25/2022   ICH (intracerebral hemorrhage) (HCC) 02/13/2021   Iron deficiency 10/07/2020   Aortic atherosclerosis (HCC) 03/17/2020   Lung nodule seen on imaging study 02/24/2020   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   Chronic left shoulder pain 08/23/2018   Chronic diarrhea 12/07/2014   Segmental colitis with rectal bleeding (HCC) 09/12/2014   Routine general medical examination at a health care facility 06/05/2014   Overweight 06/13/2011   Hyperlipidemia 02/17/2007   Essential hypertension 01/08/2007   Osteoarthritis 01/08/2007   Past Medical History:  Diagnosis Date   Arthritis    back, fingers with joint pain and  swelling.  chronic back pain   Cataract    Chronic back pain    arthritis    Diverticulosis    Elevated cholesterol    takes Niacin daily and Simvastatin   GERD (gastroesophageal reflux disease) 06/2004   non-specific gastritis on EGD 06/2004   Headache(784.0)    occasionally   History of colon polyps 2004, 2009   2004:adenomatous. 2009 hyperplastic.    History of hiatal hernia    Hypertension    takes Tenoretic and Lisinopril daily   Mucoid cyst of joint 08/2013   right index finger   Neuromuscular disorder (HCC)    numbness in right leg after knee replacement   Osteopenia 01/2014   T score -1.1 FRAX 14%/0.5%. Stable from prior DEXA   PONV (postoperative nausea and vomiting)    Rotator cuff arthropathy    Left   Seasonal allergies    Sleep apnea    wears CPAP nightly   Stroke (HCC) 1998   x 2 - mild left-sided weakness   Urge incontinence    Uterine prolapse     Past Surgical History:  Procedure Laterality Date   BACK SURGERY     x 2   BELPHAROPTOSIS REPAIR Bilateral 12/14/2017   BUBBLE STUDY  02/16/2021   Procedure: BUBBLE STUDY;  Surgeon: Vesta Mixer, MD;  Location: [facility] ENDOSCOPY;  Service: Cardiovascular;;   CARPAL TUNNEL RELEASE Bilateral    Dr August Saucer   CATARACT EXTRACTION     CATARACT EXTRACTION Bilateral 10/2017   COLONOSCOPY  2004, 2009, 2014   FINGER SURGERY     GUM SURGERY     GYNECOLOGIC CRYOSURGERY  HYSTEROSCOPY WITH D & C  12/07/2010   with resection of endometrial polyp   JOINT REPLACEMENT     KNEE ARTHROSCOPY Left 2008   lip biopsy     done at MD office Fri 11/22/13   MASS EXCISION Right 08/15/2013   Procedure: RIGHT INDEX EXCISION MASS ;  Surgeon: Tami Ribas, MD;  Location: [facility];  Service: Orthopedics;  Laterality: Right;   NASAL SEPTUM SURGERY     OOPHORECTOMY Right 2000   REVERSE SHOULDER ARTHROPLASTY Left 12/11/2018   REVERSE SHOULDER ARTHROPLASTY Left 12/11/2018   Procedure: LEFT REVERSE SHOULDER ARTHROPLASTY;   Surgeon: Cammy Copa, MD;  Location: [facility] OR;  Service: Orthopedics;  Laterality: Left;   SHOULDER ARTHROSCOPY WITH OPEN ROTATOR CUFF REPAIR AND DISTAL CLAVICLE ACROMINECTOMY Right 11/26/2013   Procedure: RIGHT SHOULDER ARTHROSCOPY WITH MINI OPEN ROTATOR CUFF REPAIR AND DISTAL CLAVICLE RESECTION, SUBACROMIAL DECOMPRESSION, POSSIBLE [facility] PATCH.;  Surgeon: Valeria Batman, MD;  Location: [facility] OR;  Service: Orthopedics;  Laterality: Right;   TEE WITHOUT CARDIOVERSION N/A 02/16/2021   Procedure: TRANSESOPHAGEAL ECHOCARDIOGRAM (TEE);  Surgeon: Elease Hashimoto Deloris Ping, MD;  Location: [facility] ENDOSCOPY;  Service: Cardiovascular;  Laterality: N/A;   TOTAL KNEE ARTHROPLASTY Right 03/21/2017   TOTAL KNEE ARTHROPLASTY Right 03/21/2017   Procedure: RIGHT TOTAL KNEE ARTHROPLASTY;  Surgeon: Valeria Batman, MD;  Location: [facility] OR;  Service: Orthopedics;  Laterality: Right;   TUBAL LIGATION      Current Facility-Administered Medications  Medication Dose Route Frequency Provider Last Rate Last Admin   ceFAZolin (ANCEF) IVPB 2g/100 mL premix  2 g Intravenous On Call to OR Magnant, Joycie Peek, PA-C       lactated ringers infusion   Intravenous Continuous Val Eagle, MD 10 mL/hr at 11/29/22 0625 New Bag at 11/29/22 0625   povidone-iodine (BETADINE) 7.5 % scrub   Topical Once Magnant, Charles L, PA-C       povidone-iodine 10 % swab 2 Application  2 Application Topical Once Magnant, Charles L, PA-C       povidone-iodine 10 % swab 2 Application  2 Application Topical Once Magnant, Charles L, PA-C       tranexamic acid (CYKLOKAPRON) 2,000 mg in sodium chloride 0.9 % 50 mL Topical Application  2,000 mg Topical To OR Cammy Copa, MD       tranexamic acid (CYKLOKAPRON) IVPB 1,000 mg  1,000 mg Intravenous To OR Magnant, Charles L, PA-C       Allergies  Allergen Reactions   Egg-Derived Products Other (See Comments)    GI UPSET, JOINT PAIN, DIFF. SWALLOWING   Etodolac Other (See Comments)    ABD. CRAMPING    Haley-Derived Products Other (See Comments)    GI UPSET, JOINT PAIN, DIFF. SWALLOWING    Omeprazole Other (See Comments)    ABD. CRAMPING   Pineapple Other (See Comments)    GI UPSET, JOINT PAIN, DIFF. SWALLOWING   Gluten Meal Diarrhea    GI upset    Social History   Tobacco Use   Smoking status: Never    Passive exposure: Yes   Smokeless tobacco: Never  Substance Use Topics   Alcohol use: Not Currently    Alcohol/week: 0.0 standard drinks of alcohol    Comment: about 2-3 times a year    Family History  Problem Relation Age of Onset   Hypertension Mother    Heart disease Mother    Diabetes Father    Hypertension Father    Heart disease Father  Stroke Father    Diabetes Maternal Aunt    Diabetes Paternal Grandmother    Hypertension Paternal Grandfather    Stroke Paternal Grandfather    Colon cancer Neg Hx    Esophageal cancer Neg Hx    Rectal cancer Neg Hx    Stomach cancer Neg Hx    Sleep apnea Neg Hx      Review of Systems  Musculoskeletal:  Positive for arthralgias.  All other systems reviewed and are negative.   Objective:  Physical Exam Vitals reviewed.  HENT:     Head: Normocephalic.     Nose: Nose normal.     Mouth/Throat:     Mouth: Mucous membranes are moist.  Cardiovascular:     Rate and Rhythm: Normal rate.     Pulses: Normal pulses.  Pulmonary:     Effort: Pulmonary effort is normal.  Abdominal:     General: Abdomen is flat.  Musculoskeletal:     Cervical back: Normal range of motion.  Skin:    General: Skin is warm.     Capillary Refill: Capillary refill takes less than 2 seconds.  Neurological:     General: No focal deficit present.     Mental Status: She is alert.  Psychiatric:        Mood and Affect: Mood normal.    Ortho exam demonstrates full active and passive range of motion of the hips.  Left knee has range of motion 5-100 with medial joint line tenderness and patellofemoral crepitus.  AP pulses 1+ out of 4.  Ankle  dorsiflexion intact.  No patellar apprehension.  Collateral and cruciate ligaments are stable.  Mild varus alignment is present.  No masses lymphadenopathy or skin changes noted in that left knee region   Vital signs in last 24 hours: Temp:  [97.7 F (36.5 C)] 97.7 F (36.5 C) (08/20 0628) Pulse Rate:  [60] 60 (08/20 0628) Resp:  [11] 11 (08/20 0628) BP: (125)/(72) 125/72 (08/20 0628) SpO2:  [96 %] 96 % (08/20 0628) Weight:  [75 kg] 75 kg (08/20 0612)  Labs:   Estimated body mass index is 30.24 kg/m as calculated from the following:   Height as of this encounter: 5\' 2"  (1.575 m).   Weight as of this encounter: 75 kg.   Imaging Review Plain radiographs demonstrate severe degenerative joint disease of the left knee(s). The overall alignment ismild varus. The bone quality appears to be good for age and reported activity level.      Assessment/Plan:  End stage arthritis, left knee   The patient history, physical examination, clinical judgment of the provider and imaging studies are consistent with end stage degenerative joint disease of the left knee(s) and total knee arthroplasty is deemed medically necessary. The treatment options including medical management, injection therapy arthroscopy and arthroplasty were discussed at length. The risks and benefits of total knee arthroplasty were presented and reviewed. The risks due to aseptic loosening, infection, stiffness, patella tracking problems, thromboembolic complications and other imponderables were discussed. The patient acknowledged the explanation, agreed to proceed with the plan and consent was signed. Patient is being admitted for inpatient treatment for surgery, pain control, PT, OT, prophylactic antibiotics, VTE prophylaxis, progressive ambulation and ADL's and discharge planning. The patient is planning to be discharged home with home health services     Patient's anticipated LOS is less than 2 midnights, meeting these  requirements: - Younger than 82 - Lives within 1 hour of care - Has a competent adult at  home to recover with post-op recover - NO history of  - Chronic pain requiring opiods  - Diabetes  - Coronary Artery Disease  - Heart failure  - Heart attack  - Stroke  - DVT/VTE  - Cardiac arrhythmia  - Respiratory Failure/COPD  - Renal failure  - Anemia  - Advanced Liver disease

## 2022-11-29 NOTE — Progress Notes (Signed)
Orthopedic Tech Progress Note Patient Details:  LUANA HERZBERGER 12-19-1950 [id number]  CPM Left Knee CPM Left Knee: On Left Knee Flexion (Degrees): 40 Left Knee Extension (Degrees): 10  Post Interventions Patient Tolerated: Well Instructions Provided: Care of device, Adjustment of device  [name] 11/29/2022, 10:58 AM

## 2022-11-29 NOTE — Progress Notes (Signed)
Orthopedic Tech Progress Note Patient Details:  [name] [birth year] [id number]  Ortho Devices Type of Ortho Device: Bone foam zero knee, CPM padding Ortho Device/Splint Interventions: Ordered, Application, Adjustment   Post Interventions Patient Tolerated: Well Instructions Provided: Care of device, Adjustment of device  [name] 11/29/2022, 10:59 AM

## 2022-11-29 NOTE — Anesthesia Procedure Notes (Signed)
Spinal  Patient location during procedure: OR Start time: 11/29/2022 7:38 AM End time: 11/29/2022 7:41 AM Reason for block: surgical anesthesia Staffing Performed: anesthesiologist  Anesthesiologist: Kaylyn Layer, MD Performed by: Kaylyn Layer, MD Authorized by: Kaylyn Layer, MD   Preanesthetic Checklist Completed: patient identified, IV checked, risks and benefits discussed, surgical consent, monitors and equipment checked, pre-op evaluation and timeout performed Spinal Block Patient position: sitting Prep: DuraPrep and site prepped and draped Patient monitoring: continuous pulse ox, blood pressure and heart rate Approach: midline Location: L3-4 Injection technique: single-shot Needle Needle type: Pencan  Needle gauge: 24 G Needle length: 9 cm Assessment Events: CSF return Additional Notes Risks, benefits, and alternative discussed. Patient gave consent to procedure. Prepped and draped in sitting position. Patient sedated but responsive to voice. Clear CSF obtained after one needle pass. Positive terminal aspiration. No pain or paraesthesias with injection. Patient tolerated procedure well. Vital signs stable. Amalia Greenhouse, MD

## 2022-11-29 NOTE — Anesthesia Procedure Notes (Addendum)
Procedure Name: MAC Date/Time: 11/29/2022 7:36 AM  Performed by: Maurene Capes, CRNAPre-anesthesia Checklist: Patient identified, Emergency Drugs available, Suction available and Patient being monitored Patient Re-evaluated:Patient Re-evaluated prior to induction Oxygen Delivery Method: Simple face mask Induction Type: IV induction Placement Confirmation: positive ETCO2 Dental Injury: Teeth and Oropharynx as per pre-operative assessment

## 2022-11-29 NOTE — Anesthesia Postprocedure Evaluation (Signed)
Anesthesia Post Note  Patient: [name]  Procedure(s) Performed: LEFT TOTAL KNEE ARTHROPLASTY (Left: Knee)     Patient location during evaluation: PACU Anesthesia Type: Spinal Level of consciousness: awake and alert Pain management: pain level controlled Vital Signs Assessment: post-procedure vital signs reviewed and stable Respiratory status: spontaneous breathing, nonlabored ventilation and respiratory function stable Cardiovascular status: blood pressure returned to baseline Postop Assessment: no apparent nausea or vomiting, spinal receding, no headache and no backache Anesthetic complications: no   No notable events documented.  Last Vitals:  Vitals:   11/29/22 1130 11/29/22 1142  BP: (!) 94/53 (!) 98/56  Pulse: 74 67  Resp: 15 16  Temp: 36.6 C 36.9 C  SpO2: 95% 96%    Last Pain:  Vitals:   11/29/22 1304  TempSrc:   PainSc: 1     LLE Motor Response: Purposeful movement (11/29/22 1306)   RLE Motor Response: Purposeful movement (11/29/22 1306)   L Sensory Level: S1-Sole of foot, small toes (11/29/22 1306) R Sensory Level: S1-Sole of foot, small toes (11/29/22 1306)  Shanda Howells

## 2022-11-29 NOTE — Transfer of Care (Signed)
Immediate Anesthesia Transfer of Care Note  Patient: [name]  Procedure(s) Performed: LEFT TOTAL KNEE ARTHROPLASTY (Left: Knee)  Patient Location: PACU  Anesthesia Type:Spinal  Level of Consciousness: oriented, drowsy, and patient cooperative  Airway & Oxygen Therapy: Patient Spontanous Breathing and Patient connected to face mask oxygen  Post-op Assessment: Report given to RN and Post -op Vital signs reviewed and stable  Post vital signs: Reviewed and stable  Last Vitals:  Vitals Value Taken Time  BP 117/69 11/29/22 1032  Temp 36.8 C 11/29/22 1032  Pulse 81 11/29/22 1044  Resp 18 11/29/22 1044  SpO2 99 % 11/29/22 1044  Vitals shown include unfiled device data.  Last Pain:  Vitals:   11/29/22 1032  TempSrc:   PainSc: 2          Complications: No notable events documented.

## 2022-11-29 NOTE — Plan of Care (Signed)
  Problem: Clinical Measurements: Goal: Postoperative complications will be avoided or minimized Outcome: Progressing   Problem: Activity: Goal: Ability to avoid complications of mobility impairment will improve Outcome: Progressing   Problem: Pain Management: Goal: Pain level will decrease with appropriate interventions Outcome: Progressing   Problem: Health Behavior/Discharge Planning: Goal: Ability to manage health-related needs will improve Outcome: Progressing   Problem: Safety: Goal: Ability to remain free from injury will improve Outcome: Progressing

## 2022-11-29 NOTE — Addendum Note (Signed)
Addendum  created 11/29/22 1501 by Kaylyn Layer, MD   Intraprocedure Meds edited

## 2022-11-30 ENCOUNTER — Encounter (HOSPITAL_COMMUNITY): Payer: Self-pay | Admitting: Orthopedic Surgery

## 2022-11-30 ENCOUNTER — Observation Stay (HOSPITAL_COMMUNITY): Payer: Medicare Other

## 2022-11-30 DIAGNOSIS — Z043 Encounter for examination and observation following other accident: Secondary | ICD-10-CM | POA: Diagnosis not present

## 2022-11-30 NOTE — Care Management Obs Status (Signed)
MEDICARE OBSERVATION STATUS NOTIFICATION   Patient Details  Name: [name] MRN: [id number] Date of Birth: 02-17-1951   Medicare Observation Status Notification Given:  Yes    Ewing Schlein, LCSW 11/30/2022, 1:39 PM

## 2022-11-30 NOTE — Progress Notes (Signed)
   11/30/22 2215  BiPAP/CPAP/SIPAP  BiPAP/CPAP/SIPAP Pt Type Adult (pt comfortable / prefers self-placement when ready for bed)  BiPAP/CPAP/SIPAP Resmed  Mask Type Full face mask (mask from home)  FiO2 (%) 21 %  Patient Home Equipment No (only her mask from home)  Auto Titrate Yes (auto4-15cm per pt home settings)  CPAP/SIPAP surface wiped down Yes

## 2022-11-30 NOTE — Progress Notes (Signed)
Physical Therapy Treatment Patient Details Name: [name] MRN: [id number] DOB: [birth year] Today's Date: 11/30/2022   History of Present Illness 72 yo female s/p L TKA 11/29/22. Hx of CVA with mild L side residual weakness, R RCR, L rev TSA 2020, back sg, ICH, R TKA 2018, chronic pain    PT Comments  Progressing with mobility. Minimal pain with activity-posterior knee. Assisted pt into "bone foam" at end of session with instructions to call for nursing assistance to help her out of it and apply ice once pt ready. Made RN aware that pt did not meet PT goals on today. Will continue to follow and progress activity.     If plan is discharge home, recommend the following: A little help with walking and/or transfers;A little help with bathing/dressing/bathroom;Assistance with cooking/housework;Assist for transportation;Help with stairs or ramp for entrance   Can travel by private vehicle        Equipment Recommendations  None recommended by PT    Recommendations for Other Services       Precautions / Restrictions Precautions Precautions: Fall;Knee Required Braces or Orthoses: Knee Immobilizer - Left Restrictions Weight Bearing Restrictions: No Other Position/Activity Restrictions: WBAT     Mobility  Bed Mobility Overal bed mobility: Needs Assistance Bed Mobility: Sit to Supine      Sit to supine: Contact guard assist, HOB elevated   General bed mobility comments: Increased time. Cues for safety, technique. Pt practiced using gait belt as leg lifter    Transfers Overall transfer level: Needs assistance Equipment used: Rolling walker (2 wheels) Transfers: Sit to/from Stand Sit to Stand: Min assist          General transfer comment: Assist to power up, stabilize, control descent. Increased time.Cues for safety, technique, hand/LE placement.    Ambulation/Gait Ambulation/Gait assistance: Min assist Gait Distance (Feet): 25 Feet (10'x1; 25'x1) Assistive device:  Rolling walker (2 wheels) Gait Pattern/deviations: Step-to pattern       General Gait Details: Cues for safety, sequencing, tightening of L quad during stance phase, posture, step-to pattern for increased safety/stability. Assist to stabilize. Intermittent L knee buckling even with KI on (slowly improving)   Stairs             Wheelchair Mobility     Tilt Bed    Modified Rankin (Stroke Patients Only)       Balance Overall balance assessment: Needs assistance         Standing balance support: Bilateral upper extremity supported, During functional activity, Reliant on assistive device for balance Standing balance-[name] Scale: Poor                              Cognition Arousal: Alert Behavior During Therapy: WFL for tasks assessed/performed Overall Cognitive Status: Within Functional Limits for tasks assessed                                          Exercises Total Joint Exercises Ankle Circles/Pumps: AROM, Both, 10 reps Quad Sets: AROM, Left, 10 reps, Seated Short Arc Quad: AAROM, Left, 10 reps Hip ABduction/ADduction: AAROM, Left, 10 reps Straight Leg Raises: AROM, Left, 15 reps, Supine (with KI on; pt used gait belt to assist)    General Comments        Pertinent Vitals/Pain Pain Assessment Pain Assessment: 0-10 Pain Score: 2  Pain  Location: L posterior knee Pain Descriptors / Indicators: Aching, Discomfort Pain Intervention(s): Limited activity within patient's tolerance, Monitored during session, Repositioned    Home Living                          Prior Function            PT Goals (current goals can now be found in the care plan section) Progress towards PT goals: Progressing toward goals    Frequency    Min 1X/week      PT Plan      Co-evaluation              AM-PAC PT "6 Clicks" Mobility   Outcome Measure  Help needed turning from your back to your side while in a flat bed  without using bedrails?: A Little Help needed moving from lying on your back to sitting on the side of a flat bed without using bedrails?: A Little Help needed moving to and from a bed to a chair (including a wheelchair)?: A Little Help needed standing up from a chair using your arms (e.g., wheelchair or bedside chair)?: A Little Help needed to walk in hospital room?: A Little Help needed climbing 3-5 steps with a railing? : Total 6 Click Score: 16    End of Session Equipment Utilized During Treatment: Gait belt;Left knee immobilizer Activity Tolerance: Patient tolerated treatment well Patient left: in bed;with call bell/phone within reach;with bed alarm set   PT Visit Diagnosis: Other abnormalities of gait and mobility (R26.89);Pain Pain - Right/Left: Left Pain - part of body: Knee     Time: 1415-1441 PT Time Calculation (min) (ACUTE ONLY): 26 min  Charges:    $Gait Training: 23-37 mins $Therapeutic Exercise: 8-22 mins PT General Charges $$ ACUTE PT VISIT: 1 Visit                         Faye Ramsay, PT Acute Rehabilitation  Office: [phone]

## 2022-11-30 NOTE — Progress Notes (Signed)
Physical Therapy Treatment Patient Details Name: [name] MRN: [id number] DOB: 1951-03-02 Today's Date: 11/30/2022   History of Present Illness 72 yo female s/p L TKA 11/29/22. 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 Minimal pain with activity. L quad weakness/knee buckling still occurring, even while wearing KI. Fall risk when ambulating. Do not anticipate pt will meet PT goals on today. Will continue to work with patient and safely progress activity as able.    If plan is discharge home, recommend the following: A little help with walking and/or transfers;A little help with bathing/dressing/bathroom;Assistance with cooking/housework;Assist for transportation;Help with stairs or ramp for entrance   Can travel by private vehicle        Equipment Recommendations  None recommended by PT    Recommendations for Other Services       Precautions / Restrictions Precautions Precautions: Fall;Knee Required Braces or Orthoses: Knee Immobilizer - Left Restrictions Weight Bearing Restrictions: No Other Position/Activity Restrictions: WBAT     Mobility  Bed Mobility Overal bed mobility: Needs Assistance Bed Mobility: Supine to Sit     Supine to sit: Min assist, HOB elevated, Used rails     General bed mobility comments: Min A for L LE. Increased time. Cues for safety, technique.    Transfers Overall transfer level: Needs assistance Equipment used: Rolling walker (2 wheels) Transfers: Sit to/from Stand Sit to Stand: Min assist, From elevated surface   Step pivot transfers: Min assist       General transfer comment: Assist to power up, stabilize, control descent. Increased time.Cues for safety, technique, hand/LE placement.    Ambulation/Gait Min Assist  Gait Distance (Feet): 10 Feet Assistive device: Rolling walker (2 wheels) Gait  Pattern/deviations: Step-to pattern       General Gait Details: Cues for safety, sequencing, tightening of L quad during stance phase, posture, step-to pattern for increased safety/stability. Assist to stabilize. Intermittent L knee buckling even with KI on.   Stairs             Wheelchair Mobility     Tilt Bed    Modified Rankin (Stroke Patients Only)       Balance Overall balance assessment: Needs assistance         Standing balance support: Bilateral upper extremity supported, During functional activity, Reliant on assistive device for balance Standing balance-[name] Scale: Poor                              Cognition Arousal: Alert Behavior During Therapy: WFL for tasks assessed/performed Overall Cognitive Status: Within Functional Limits for tasks assessed                                          Exercises Total Joint Exercises Ankle Circles/Pumps: AROM, Both, 10 reps Quad Sets: AROM, Left, 10 reps Short Arc Quad: AAROM, Left, 10 reps Hip ABduction/ADduction: AAROM, Left, 10 reps Straight Leg Raises: AAROM, Left, 10 reps    General Comments        Pertinent Vitals/Pain Pain Assessment Pain Assessment: 0-10 Pain Score: 2  Pain Location: L posterior knee Pain Descriptors / Indicators: Aching, Discomfort Pain Intervention(s): Limited activity within patient's tolerance, Monitored during session,  Repositioned, Ice applied    Home Living                          Prior Function            PT Goals (current goals can now be found in the care plan section) Progress towards PT goals: Progressing toward goals    Frequency    Min 1X/week      PT Plan      Co-evaluation              AM-PAC PT "6 Clicks" Mobility   Outcome Measure  Help needed turning from your back to your side while in a flat bed without using bedrails?: A Little Help needed moving from lying on your back to sitting on the  side of a flat bed without using bedrails?: A Little Help needed moving to and from a bed to a chair (including a wheelchair)?: A Little Help needed standing up from a chair using your arms (e.g., wheelchair or bedside chair)?: A Little Help needed to walk in hospital room?: A Lot Help needed climbing 3-5 steps with a railing? : Total 6 Click Score: 15    End of Session Equipment Utilized During Treatment: Gait belt;Left knee immobilizer Activity Tolerance: Patient tolerated treatment well Patient left: in chair;with call bell/phone within reach   PT Visit Diagnosis: Other abnormalities of gait and mobility (R26.89);Pain Pain - Right/Left: Left Pain - part of body: Knee     Time: 8295-6213 PT Time Calculation (min) (ACUTE ONLY): 24 min  Charges:    $Gait Training: 8-22 mins $Therapeutic Exercise: 8-22 mins PT General Charges $$ ACUTE PT VISIT: 1 Visit                        Faye Ramsay, PT Acute Rehabilitation  Office: [phone]

## 2022-11-30 NOTE — Progress Notes (Signed)
   11/30/22 1950  What Happened  Was fall witnessed? Yes  Who witnessed fall? Laxmi Choung RN  Patients activity before fall ambulating-assisted;bathroom-assisted  Point of contact arm/shoulder (right hand)  Was patient injured? Yes (complaining of right wrist pain)  Provider Notification  Provider Name/Title Dr. Willia Craze  Date Provider Notified 11/30/22  Time Provider Notified 2030  Method of Notification Page  Notification Reason Fall  Provider response See new orders (xray right wrist)  Date of Provider Response 11/30/22  Time of Provider Response 2037  Follow Up  Family notified No - patient refusal (patient notified daughter and husband through text)  Simple treatment Ice (celebrex, oxycodone and robaxin po given)  Progress note created (see row info) Yes  Adult Fall Risk Assessment  Risk Factor Category (scoring not indicated) High fall risk per protocol (document High fall risk)  Age 72  Fall History: Fall within 6 months prior to admission 0  Elimination; Bowel and/or Urine Incontinence 0  Elimination; Bowel and/or Urine Urgency/Frequency 0  Medications: includes PCA/Opiates, Anti-convulsants, Anti-hypertensives, Diuretics, Hypnotics, Laxatives, Sedatives, and Psychotropics 5  Patient Care Equipment 2  Mobility-Assistance 2  Mobility-Gait 2  Mobility-Sensory Deficit 0  Altered awareness of immediate physical environment 0  Impulsiveness 0  Lack of understanding of one's physical/cognitive limitations 0  Total Score 13  Patient Fall Risk Level High fall risk  Adult Fall Risk Interventions  Required Bundle Interventions *See Row Information* High fall risk - low, moderate, and high requirements implemented  Additional Interventions HeadStart bed sensor (education/return demonstration);Assess orthostatic BP;Use of appropriate toileting equipment (bedpan, BSC, etc.)  Vitals  Temp 97.6 F (36.4 C)  Temp Source Oral  BP 106/83  MAP (mmHg) 92  BP Location Left  Arm  BP Method Automatic  Patient Position (if appropriate) Sitting  Pulse Rate 76  Pulse Rate Source Monitor  Resp 20  Oxygen Therapy  SpO2 97 %  O2 Device Room Air  Pain Assessment  Pain Scale 0-10  Pain Score 10  Pain Type Acute pain  Pain Location Wrist  Pain Orientation Right  Pain Descriptors / Indicators Aching  Pain Onset On-going  Pain Intervention(s) Medication (See eMAR);Hot/Cold interventions (ice pack)  Hot/Cold Interventions  Hot/Cold Interventions Ice Pack  PCA/Epidural/Spinal Assessment  Respiratory Pattern Regular;Unlabored  Neurological  Neuro (WDL) WDL  Level of Consciousness Alert  Orientation Level Oriented X4  R Foot Dorsiflexion Moderate  L Foot Dorsiflexion Moderate  R Foot Plantar Flexion Moderate  L Foot Plantar Flexion Moderate  RLE Motor Response Purposeful movement  Musculoskeletal  Musculoskeletal (WDL) X  Assistive Device Front wheel walker;BSC  Weight Bearing Restrictions Yes  Musculoskeletal Details  LLE Limited movement  LLE Ortho/Supportive Device Knee Immobilizer  Right Wrist Injury/trauma;Limited movement  Integumentary  Integumentary (WDL) WDL

## 2022-11-30 NOTE — Plan of Care (Signed)
  Problem: Activity: Goal: Ability to avoid complications of mobility impairment will improve Outcome: Progressing Goal: Range of joint motion will improve Outcome: Progressing   Problem: Pain Management: Goal: Pain level will decrease with appropriate interventions Outcome: Progressing   

## 2022-11-30 NOTE — Progress Notes (Signed)
  Subjective: [name] is a 72 y.o. female s/p left TKA.  They are POD 1.  Pt's pain is controlled.  Pt denies any complain of chest pain, shortness of breath, abdominal pain, calf pain.  Patient denies any fevers or chills.  Has not ambulated yet.  Struggled with physical therapy yesterday due to block.  Has 2 stairs to get into her house.  Objective: Vital signs in last 24 hours: Temp:  [97.3 F (36.3 C)-98.6 F (37 C)] 97.5 F (36.4 C) (08/21 0942) Pulse Rate:  [55-84] 63 (08/21 0942) Resp:  [12-22] 16 (08/21 0942) BP: (81-127)/(48-73) 110/63 (08/21 0942) SpO2:  [95 %-100 %] 100 % (08/21 0942) FiO2 (%):  [21 %] 21 % (08/21 0033)  Intake/Output from previous day: 08/20 0701 - 08/21 0700 In: 2342.8 [P.O.:420; I.V.:1617.8; IV Piggyback:305] Out: 820 [Urine:800; Blood:20] Intake/Output this shift: Total I/O In: 240 [P.O.:240] Out: -   Exam:  No gross blood or drainage overlying the dressing 2+ DP pulse Sensation intact distally in the operative foot Able to dorsiflex and plantarflex the operative foot No calf tenderness.  Negative Homans' sign. Not able to perform straight leg raise   Labs: No results for input(s): "HGB" in the last 72 hours. No results for input(s): "WBC", "RBC", "HCT", "PLT" in the last 72 hours. No results for input(s): "NA", "K", "CL", "CO2", "BUN", "CREATININE", "GLUCOSE", "CALCIUM" in the last 72 hours. No results for input(s): "LABPT", "INR" in the last 72 hours.  Assessment/Plan: Pt is POD 1 s/p TKA.    -Plan to discharge to home today or tomorrow pending patient's pain and PT eval  -WBAT with a walker  -Follow-up with Dr. August Saucer in clinic 2 weeks postoperatively    [facility] 11/30/2022, 10:27 AM

## 2022-11-30 NOTE — TOC Transition Note (Signed)
Transition of Care [facility]) - CM/SW Discharge Note  Patient Details  Name: [name] MRN: [id number] Date of Birth: Jan 26, 1951  [facility] CM/SW Contact:  Ewing Schlein, LCSW Phone Number: 11/30/2022, 10:16 AM  Clinical Narrative: Patient is expected to discharge home after working with PT. CSW met with patient to confirm discharge plan. Patient will go home with HHPT, which was prearranged with [facility] in orthopedist's office. Patient has a rolling walker at home, so there are no DME needs at this time. TOC signing off.  Final next level of care: Home w Home Health Services Barriers to Discharge: No Barriers Identified  Patient Goals and CMS Choice CMS Medicare.gov Compare Post Acute Care list provided to:: Patient Choice offered to / list presented to : Patient  Discharge Plan and Services Additional resources added to the After Visit Summary for         DME Arranged: N/A DME Agency: NA HH Arranged: PT HH Agency: [facility] Representative spoke with at [facility] Agency: Prearranged in orthopedist's office  Social Determinants of Health (SDOH) Interventions SDOH Screenings   Food Insecurity: No Food Insecurity (11/29/2022)  Housing: Low Risk  (11/29/2022)  Transportation Needs: No Transportation Needs (11/29/2022)  Utilities: Not At Risk (11/29/2022)  Alcohol Screen: Low Risk  (11/09/2022)  Depression (PHQ2-9): Low Risk  (11/09/2022)  Financial Resource Strain: Low Risk  (11/09/2022)  Physical Activity: Inactive (11/09/2022)  Social Connections: Moderately Isolated (11/09/2022)  Stress: No Stress Concern Present (11/09/2022)  Tobacco Use: Medium Risk (11/29/2022)  Health Literacy: Adequate Health Literacy (11/09/2022)   Readmission Risk Interventions     No data to display

## 2022-11-30 NOTE — Progress Notes (Signed)
   11/30/22 0033  BiPAP/CPAP/SIPAP  BiPAP/CPAP/SIPAP Pt Type Adult  BiPAP/CPAP/SIPAP Resmed  Mask Type Full face mask  Mask Size Medium  FiO2 (%) 21 %  Patient Home Equipment  (home mask)  Auto Titrate Yes (4-15 per pt)  BiPAP/CPAP /SiPAP Vitals  SpO2 98 %

## 2022-12-01 DIAGNOSIS — Z96651 Presence of right artificial knee joint: Secondary | ICD-10-CM | POA: Diagnosis present

## 2022-12-01 DIAGNOSIS — Z8249 Family history of ischemic heart disease and other diseases of the circulatory system: Secondary | ICD-10-CM | POA: Diagnosis not present

## 2022-12-01 DIAGNOSIS — I69354 Hemiplegia and hemiparesis following cerebral infarction affecting left non-dominant side: Secondary | ICD-10-CM | POA: Diagnosis not present

## 2022-12-01 DIAGNOSIS — M8580 Other specified disorders of bone density and structure, unspecified site: Secondary | ICD-10-CM | POA: Diagnosis present

## 2022-12-01 DIAGNOSIS — Z823 Family history of stroke: Secondary | ICD-10-CM | POA: Diagnosis not present

## 2022-12-01 DIAGNOSIS — M1712 Unilateral primary osteoarthritis, left knee: Secondary | ICD-10-CM | POA: Diagnosis present

## 2022-12-01 DIAGNOSIS — Z8719 Personal history of other diseases of the digestive system: Secondary | ICD-10-CM | POA: Diagnosis not present

## 2022-12-01 DIAGNOSIS — K219 Gastro-esophageal reflux disease without esophagitis: Secondary | ICD-10-CM | POA: Diagnosis present

## 2022-12-01 DIAGNOSIS — M549 Dorsalgia, unspecified: Secondary | ICD-10-CM | POA: Diagnosis present

## 2022-12-01 DIAGNOSIS — Z888 Allergy status to other drugs, medicaments and biological substances status: Secondary | ICD-10-CM | POA: Diagnosis not present

## 2022-12-01 DIAGNOSIS — G4733 Obstructive sleep apnea (adult) (pediatric): Secondary | ICD-10-CM | POA: Diagnosis present

## 2022-12-01 DIAGNOSIS — Z91018 Allergy to other foods: Secondary | ICD-10-CM | POA: Diagnosis not present

## 2022-12-01 DIAGNOSIS — G8929 Other chronic pain: Secondary | ICD-10-CM | POA: Diagnosis present

## 2022-12-01 DIAGNOSIS — Z7982 Long term (current) use of aspirin: Secondary | ICD-10-CM | POA: Diagnosis not present

## 2022-12-01 DIAGNOSIS — Z79899 Other long term (current) drug therapy: Secondary | ICD-10-CM | POA: Diagnosis not present

## 2022-12-01 DIAGNOSIS — Z833 Family history of diabetes mellitus: Secondary | ICD-10-CM | POA: Diagnosis not present

## 2022-12-01 DIAGNOSIS — Z96612 Presence of left artificial shoulder joint: Secondary | ICD-10-CM | POA: Diagnosis present

## 2022-12-01 DIAGNOSIS — Z91013 Allergy to seafood: Secondary | ICD-10-CM | POA: Diagnosis not present

## 2022-12-01 DIAGNOSIS — E7800 Pure hypercholesterolemia, unspecified: Secondary | ICD-10-CM | POA: Diagnosis present

## 2022-12-01 DIAGNOSIS — Z91012 Allergy to eggs: Secondary | ICD-10-CM | POA: Diagnosis not present

## 2022-12-01 NOTE — Progress Notes (Signed)
Physical Therapy Treatment Patient Details Name: [name] MRN: [id number] DOB: October 27, 1950 Today's Date: 12/01/2022   History of Present Illness 72 yo female s/p L TKA 11/29/22. 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 Utilized KI  for ambulation.  Patient  sustained injury  to right wrist and now will require a platform on her RW. Will see if personal RW will  accommodate the platform, spouse to bring RW up. Have placed request for platform with TOC.   If plan is discharge home, recommend the following: A little help with walking and/or transfers;A little help with bathing/dressing/bathroom;Assistance with cooking/housework;Assist for transportation;Help with stairs or ramp for entrance   Can travel by private vehicle        Equipment Recommendations   (right platform)    Recommendations for Other Services       Precautions / Restrictions Precautions Precautions: Fall;Knee Required Braces or Orthoses: Knee Immobilizer - Left Restrictions Other Position/Activity Restrictions: WBAT     Mobility  Bed Mobility   Bed Mobility: Supine to Sit     Supine to sit: Min assist, HOB elevated, Used rails     General bed mobility comments: Increased time. Cues for safety, technique. Pt practiced using gait belt as leg lifter    Transfers   Equipment used: Right platform walker Transfers: Sit to/from Stand Sit to Stand: Min assist           General transfer comment: Assist to power up, stabilize, control descent. Increased time.Cues for safety, technique, hand/LE placement., Right platform on Rw    Ambulation/Gait Ambulation/Gait assistance: Min assist Gait Distance (Feet): 50 Feet Assistive device: Right platform walker Gait Pattern/deviations: Step-to pattern, Step-through pattern Gait velocity: decr     General Gait Details: Cues for  safety, sequencing, no knee buckling, able to SLR   Stairs             Wheelchair Mobility     Tilt Bed    Modified Rankin (Stroke Patients Only)       Balance Overall balance assessment: Needs assistance Sitting-balance support: No upper extremity supported, Feet supported Sitting balance-[name] Scale: Good     Standing balance support: Bilateral upper extremity supported, During functional activity, Reliant on assistive device for balance Standing balance-[name] Scale: Fair                              Cognition Arousal: Alert                                              Exercises Total Joint Exercises Ankle Circles/Pumps: AROM, Both, 10 reps Quad Sets: AROM, Left, 10 reps, Seated Heel Slides: AAROM, Left, 10 reps Straight Leg Raises: AROM, Left, 15 reps, Supine Goniometric ROM: 10-50    General Comments        Pertinent Vitals/Pain Pain Assessment Pain Score: 4  Pain Location: L posterior knee Pain Descriptors / Indicators: Aching, Discomfort Pain Intervention(s): Monitored during session, Premedicated before session, Ice applied    Home Living  Prior Function            PT Goals (current goals can now be found in the care plan section) Progress towards PT goals: Progressing toward goals    Frequency    Min 1X/week      PT Plan      Co-evaluation              AM-PAC PT "6 Clicks" Mobility   Outcome Measure  Help needed turning from your back to your side while in a flat bed without using bedrails?: A Little Help needed moving from lying on your back to sitting on the side of a flat bed without using bedrails?: A Little Help needed moving to and from a bed to a chair (including a wheelchair)?: A Little Help needed standing up from a chair using your arms (e.g., wheelchair or bedside chair)?: A Little Help needed to walk in hospital room?: A Little Help needed  climbing 3-5 steps with a railing? : A Lot 6 Click Score: 17    End of Session Equipment Utilized During Treatment: Gait belt;Left knee immobilizer Activity Tolerance: Patient tolerated treatment well Patient left: in chair;with call bell/phone within reach Nurse Communication: Mobility status PT Visit Diagnosis: Other abnormalities of gait and mobility (R26.89);Pain Pain - Right/Left: Left Pain - part of body: Knee     Time: 6962-9528 PT Time Calculation (min) (ACUTE ONLY): 54 min  Charges:    $Gait Training: 23-37 mins $Therapeutic Exercise: 23-37 mins $Self Care/Home Management: 8-22 PT General Charges $$ ACUTE PT VISIT: 1 Visit                     Blanchard Kelch PT Acute Rehabilitation Services Office [phone] Weekend [phone]    Rada Hay 12/01/2022, 4:15 PM

## 2022-12-01 NOTE — Plan of Care (Signed)
  Problem: Activity: Goal: Range of joint motion will improve Outcome: Progressing   Problem: Clinical Measurements: Goal: Will remain free from infection Outcome: Progressing

## 2022-12-01 NOTE — Progress Notes (Signed)
Orthopedic Tech Progress Note Patient Details:  [name] 10-Jan-1951 [id number]  Patient ID: [name], female   DOB: 11/07/1950, 72 y.o.   MRN: [id number]  [name] 12/01/2022, 4:52 PM Cpm removed. Bone foam applied. Patient tolerated well.

## 2022-12-01 NOTE — Progress Notes (Signed)
Orthopedic Tech Progress Note Patient Details:  [name] 1950/11/03 [id number]  CPM Left Knee CPM Left Knee: On Left Knee Flexion (Degrees): 40 Left Knee Extension (Degrees): 10  Post Interventions Patient Tolerated: Well Instructions Provided: Care of device, Adjustment of device  [name] 12/01/2022, 2:40 PM

## 2022-12-01 NOTE — Progress Notes (Signed)
   12/01/22 1933  BiPAP/CPAP/SIPAP  BiPAP/CPAP/SIPAP Pt Type Adult (pt comfortable / prefers self-placement)  BiPAP/CPAP/SIPAP Resmed  Mask Type Full face mask (mask from home)  FiO2 (%) 21 %  Patient Home Equipment No (only her mask from home)  Auto Titrate Yes (automode, 4-15cm per home settings)  CPAP/SIPAP surface wiped down Yes

## 2022-12-01 NOTE — Progress Notes (Signed)
  Subjective: Patient stable.  Reporting pain in the posterior aspect of the knee but not the calf.  Is able to do a straight leg raise today   Objective: Vital signs in last 24 hours: Temp:  [97.5 F (36.4 C)-98 F (36.7 C)] 97.6 F (36.4 C) (08/22 0551) Pulse Rate:  [59-76] 60 (08/22 0551) Resp:  [14-20] 15 (08/22 0551) BP: (97-118)/(56-83) 97/65 (08/22 0551) SpO2:  [97 %-100 %] 98 % (08/22 0551) FiO2 (%):  [21 %] 21 % (08/21 2215)  Intake/Output from previous day: 08/21 0701 - 08/22 0700 In: 480 [P.O.:480] Out: -  Intake/Output this shift: Total I/O In: 240 [P.O.:240] Out: -   Exam:  Dorsiflexion/Plantar flexion intact No cellulitis present Compartment soft  Labs: No results for input(s): "HGB" in the last 72 hours. No results for input(s): "WBC", "RBC", "HCT", "PLT" in the last 72 hours. No results for input(s): "NA", "K", "CL", "CO2", "BUN", "CREATININE", "GLUCOSE", "CALCIUM" in the last 72 hours. No results for input(s): "LABPT", "INR" in the last 72 hours.  Assessment/Plan: Plan at this time is continue with physical therapy today and this afternoon and tomorrow morning.  Anticipate discharge to home tomorrow afternoon.   [name] 12/01/2022, 10:47 AM

## 2022-12-01 NOTE — Progress Notes (Signed)
Physical Therapy Treatment Patient Details Name: [name] MRN: [id number] DOB: 1950-07-28 Today's Date: 12/01/2022   History of Present Illness 72 yo female s/p L TKA 11/29/22. 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 Will practice steps  in AM.   Will need platform for Rw.   If plan is discharge home, recommend the following: A little help with walking and/or transfers;A little help with bathing/dressing/bathroom;Assistance with cooking/housework;Assist for transportation;Help with stairs or ramp for entrance   Can travel by private vehicle        Equipment Recommendations   (right platform)    Recommendations for Other Services       Precautions / Restrictions Precautions Precautions: Fall;Knee Required Braces or Orthoses: Knee Immobilizer - Left Restrictions Other Position/Activity Restrictions: WBAT     Mobility  Bed Mobility   Bed Mobility: Sit to Supine     Supine to sit: Min assist, HOB elevated, Used rails Sit to supine: Min assist   General bed mobility comments: belt and min assist t =for left leg    Transfers Overall transfer level: Needs assistance Equipment used: Right platform walker Transfers: Sit to/from Stand Sit to Stand: Contact guard assist           General transfer comment: Assist to power up, stabilize, control descent. Increased time.Cues for safety, technique, hand/LE placement., Right platform on Rw    Ambulation/Gait Ambulation/Gait assistance: Contact guard assist Gait Distance (Feet): 50 Feet Assistive device: Right platform walker Gait Pattern/deviations: Step-to pattern, Step-through pattern Gait velocity: decr     General Gait Details: Cues for safety, sequencing, no knee buckling, able to SLR   Stairs             Wheelchair Mobility     Tilt Bed    Modified  Rankin (Stroke Patients Only)       Balance Overall balance assessment: Needs assistance Sitting-balance support: No upper extremity supported, Feet supported Sitting balance-[name] Scale: Good     Standing balance support: Bilateral upper extremity supported, During functional activity, Reliant on assistive device for balance Standing balance-[name] Scale: Fair                              Cognition Arousal: Alert                                              Exercises   General Comments        Pertinent Vitals/Pain Pain Assessment Pain Score: 4  Pain Location: L posterior knee Pain Descriptors / Indicators: Aching, Discomfort Pain Intervention(s): Monitored during session, Premedicated before session    Home Living                          Prior Function            PT Goals (current goals can now be found in the care plan section) Progress towards PT goals: Progressing toward goals    Frequency    Min 1X/week      PT Plan      Co-evaluation  AM-PAC PT "6 Clicks" Mobility   Outcome Measure  Help needed turning from your back to your side while in a flat bed without using bedrails?: A Little Help needed moving from lying on your back to sitting on the side of a flat bed without using bedrails?: A Little Help needed moving to and from a bed to a chair (including a wheelchair)?: A Little Help needed standing up from a chair using your arms (e.g., wheelchair or bedside chair)?: A Little Help needed to walk in hospital room?: A Little Help needed climbing 3-5 steps with a railing? : A Lot 6 Click Score: 17    End of Session Equipment Utilized During Treatment: Gait belt;Right knee immobilizer Activity Tolerance: Patient tolerated treatment well Patient left: in bed;with call bell/phone within reach Nurse Communication: Mobility status PT Visit Diagnosis: Other abnormalities of gait and mobility  (R26.89);Pain Pain - Right/Left: Left Pain - part of body: Knee     Time: 5366-4403 PT Time Calculation (min) (ACUTE ONLY): 32 min  Charges:    $Gait Training: 8-22 mins $$PT General Charges $$ ACUTE PT VISIT: 1 Visit                     Blanchard Kelch PT Acute Rehabilitation Services Office [phone] Weekend [phone]    Rada Hay 12/01/2022, 4:19 PM

## 2022-12-02 MED ORDER — ASPIRIN 81 MG PO CHEW: 81.0000 mg | CHEWABLE_TABLET | Freq: Two times a day (BID) | ORAL | 0 refills | Status: AC

## 2022-12-02 MED ORDER — CELECOXIB 100 MG PO CAPS: 100.0000 mg | ORAL_CAPSULE | Freq: Two times a day (BID) | ORAL | 0 refills | Status: DC

## 2022-12-02 MED ORDER — OXYCODONE HCL 5 MG PO TABS: 5.0000 mg | ORAL_TABLET | ORAL | 0 refills | Status: DC | PRN

## 2022-12-02 MED ORDER — DOCUSATE SODIUM 100 MG PO CAPS: 100.0000 mg | ORAL_CAPSULE | Freq: Two times a day (BID) | ORAL | 0 refills | Status: DC

## 2022-12-02 NOTE — Progress Notes (Addendum)
Physical Therapy Treatment Patient Details Name: [name] MRN: [id number] DOB: 1950/12/12 Today's Date: 12/02/2022   History of Present Illness 72 yo female s/p L TKA 11/29/22. 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 Patient requiring Platform for RW, TOC working on getting device for patient.  Will practice steps PFRW  next visit.   If plan is discharge home, recommend the following: A little help with walking and/or transfers;A little help with bathing/dressing/bathroom;Assistance with cooking/housework;Assist for transportation;Help with stairs or ramp for entrance   Can travel by private vehicle        Equipment Recommendations   (platform)    Recommendations for Other Services       Precautions / Restrictions Precautions Precautions: Fall;Knee Precaution Comments: right wrist sprain Restrictions Other Position/Activity Restrictions: unable to WB on right wrist     Mobility  Bed Mobility Overal bed mobility: Needs Assistance Bed Mobility: Sit to Supine     Supine to sit: Min assist, HOB elevated, Used rails Sit to supine: Supervision   General bed mobility comments: belt and min assist for left leg    Transfers Overall transfer level: Needs assistance Equipment used: Right platform walker Transfers: Sit to/from Stand Sit to Stand: Contact guard assist           General transfer comment: able to pull to stand at PFRW from bed and toilet,  used rail in BR    Ambulation/Gait Ambulation/Gait assistance: Contact guard assist Gait Distance (Feet): 20 Feet (x2) Assistive device: Right platform walker Gait Pattern/deviations: Step-to pattern, Step-through pattern Gait velocity: decr     General Gait Details: pt. reports left knee much more sore  and limited  distance   Stairs             Wheelchair Mobility     Tilt Bed    Modified Rankin (Stroke Patients Only)       Balance Overall balance assessment: Needs assistance Sitting-balance support: No upper extremity supported, Feet supported Sitting balance-[name] Scale: Good     Standing balance support: Bilateral upper extremity supported, During functional activity, Reliant on assistive device for balance Standing balance-[name] Scale: Fair Standing balance comment: standing and pericare                            Cognition Arousal: Alert                                              Exercises      General Comments        Pertinent Vitals/Pain Pain Assessment Pain Score: 5  Pain Location: L posterior knee, thigh Pain Descriptors / Indicators: Aching, Discomfort Pain Intervention(s): Limited activity within patient's tolerance, Monitored during session, Premedicated before session    Home Living                          Prior Function            PT Goals (current goals can now be found in the care plan section) Progress towards PT  goals: Progressing toward goals    Frequency    Min 1X/week      PT Plan      Co-evaluation              AM-PAC PT "6 Clicks" Mobility   Outcome Measure  Help needed turning from your back to your side while in a flat bed without using bedrails?: A Little Help needed moving from lying on your back to sitting on the side of a flat bed without using bedrails?: A Little Help needed moving to and from a bed to a chair (including a wheelchair)?: A Little Help needed standing up from a chair using your arms (e.g., wheelchair or bedside chair)?: A Little Help needed to walk in hospital room?: A Little Help needed climbing 3-5 steps with a railing? : A Lot 6 Click Score: 17    End of Session Equipment Utilized During Treatment: Gait belt;Right knee  immobilizer Activity Tolerance: Patient tolerated treatment well Patient left: in chair;with call bell/phone within reach Nurse Communication: Mobility status PT Visit Diagnosis: Other abnormalities of gait and mobility (R26.89);Pain;Difficulty in walking, not elsewhere classified (R26.2) Pain - Right/Left: Left Pain - part of body: Knee     Time: 1103-1130 PT Time Calculation (min) (ACUTE ONLY): 27 min  Charges:      PT General Charges $$ ACUTE PT VISIT: 1 Visit                     Blanchard Kelch PT Acute Rehabilitation Services Office [phone] Weekend [phone]    Rada Hay 12/02/2022, 2:16 PM

## 2022-12-02 NOTE — Plan of Care (Signed)

## 2022-12-02 NOTE — Progress Notes (Signed)
  Subjective: Patient stable.  Progressing with physical therapy.  Did use CPM machine last night   Objective: Vital signs in last 24 hours: Temp:  [97.4 F (36.3 C)-99.5 F (37.5 C)] 98 F (36.7 C) (08/23 0626) Pulse Rate:  [66-86] 78 (08/23 0626) Resp:  [15-20] 16 (08/23 0626) BP: (104-127)/(50-73) 113/73 (08/23 0626) SpO2:  [95 %-100 %] 95 % (08/23 0626) FiO2 (%):  [21 %] 21 % (08/22 1933)  Intake/Output from previous day: 08/22 0701 - 08/23 0700 In: 1050 [P.O.:1050] Out: -  Intake/Output this shift: No intake/output data recorded.  Exam:  Dorsiflexion/Plantar flexion intact No cellulitis present  Labs: No results for input(s): "HGB" in the last 72 hours. No results for input(s): "WBC", "RBC", "HCT", "PLT" in the last 72 hours. No results for input(s): "NA", "K", "CL", "CO2", "BUN", "CREATININE", "GLUCOSE", "CALCIUM" in the last 72 hours. No results for input(s): "LABPT", "INR" in the last 72 hours.  Assessment/Plan: Plan at this time is physical therapy this morning and possibly this afternoon.  Anticipate discharge home if safe with PT.   Burnard Bunting 12/02/2022, 7:49 AM

## 2022-12-02 NOTE — TOC Progression Note (Signed)
Transition of Care [facility]) - Progression Note   Patient Details  Name: [name] MRN: [id number] Date of Birth: [birth year]  Transition of Care [facility]) CM/SW Contact  Ewing Schlein, LCSW Phone Number: 12/02/2022, 2:48 PM  Clinical Narrative: Patient will now need a new walker with a right platform, which [employer] provided.  Barriers to Discharge: No Barriers Identified  Expected Discharge Plan and Services In-house Referral: Clinical Social Work             DME Arranged: Dan Humphreys rolling, Environmental consultant platform DME Agency: [employer] Date DME Agency Contacted: 12/02/22 Time DME Agency Contacted: 1308 Representative spoke with at DME Agency: Loraine Leriche HH Arranged: PT HH Agency: [facility] Representative spoke with at [facility] Agency: Prearranged in orthopedist's office  Social Determinants of Health (SDOH) Interventions SDOH Screenings   Food Insecurity: No Food Insecurity (11/29/2022)  Housing: Low Risk  (11/29/2022)  Transportation Needs: No Transportation Needs (11/29/2022)  Utilities: Not At Risk (11/29/2022)  Alcohol Screen: Low Risk  (11/09/2022)  Depression (PHQ2-9): Low Risk  (11/09/2022)  Financial Resource Strain: Low Risk  (11/09/2022)  Physical Activity: Inactive (11/09/2022)  Social Connections: Moderately Isolated (11/09/2022)  Stress: No Stress Concern Present (11/09/2022)  Tobacco Use: Medium Risk (11/29/2022)  Health Literacy: Adequate Health Literacy (11/09/2022)   Readmission Risk Interventions     No data to display

## 2022-12-02 NOTE — Progress Notes (Signed)
Physical Therapy Treatment Patient Details Name: [name] MRN: [id number] DOB: Sep 24, 1950 Today's Date: 12/02/2022   History of Present Illness 72 yo female s/p L TKA 11/29/22. 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 Patient has met PT goals for Dc but waiting on appropriate equipment for use at home- platform RW.  Will continue PT if not DC'd.    If plan is discharge home, recommend the following: A little help with walking and/or transfers;A little help with bathing/dressing/bathroom;Assistance with cooking/housework;Assist for transportation;Help with stairs or ramp for entrance   Can travel by private vehicle        Equipment Recommendations   (platform)    Recommendations for Other Services       Precautions / Restrictions Precautions Precautions: Fall;Knee Precaution Comments: right wrist sprain Required Braces or Orthoses: Knee Immobilizer - Left Restrictions Other Position/Activity Restrictions: unable to WB on right wrist     Mobility  Bed Mobility   General bed mobility comments: in recliner    Transfers Overall transfer level: Needs assistance Equipment used: Right platform walker Transfers: Sit to/from Stand Sit to Stand: Contact guard assist           General transfer comment: able to push up from recliner with LUE to stand at PFRW from    Ambulation/Gait Ambulation/Gait assistance: Contact guard assist Gait Distance (Feet): 40 Feet Assistive device: Right platform walker Gait Pattern/deviations: Step-to pattern, Step-through pattern Gait velocity: decr     General Gait Details: pt. reports left knee much more sore and limited  distance   Stairs Stairs: Yes Stairs assistance: Min assist Stair Management: No rails, Backwards, With walker Number of Stairs: 2 General stair comments: using PFRW, able to srtep up 2  steps. Attempted with cane and armhold on the RUE but felt unable to  steady well enough to step up x 2 steps so backed down the 1 step.   Wheelchair Mobility     Tilt Bed    Modified Rankin (Stroke Patients Only)       Balance Overall balance assessment: Needs assistance Sitting-balance support: No upper extremity supported, Feet supported Sitting balance-[name] Scale: Good     Standing balance support: Bilateral upper extremity supported, During functional activity, Reliant on assistive device for balance Standing balance-[name] Scale: Fair Standing balance comment: standing and pericare                            Cognition Arousal: Alert                                              Exercises Total Joint Exercises Ankle Circles/Pumps: AROM Quad Sets: AROM, Left, 10 reps, Seated Heel Slides: AAROM, Left, 10 reps Hip ABduction/ADduction: AAROM, Left, 10 reps Straight Leg Raises: AROM, Left, Supine, 10 reps Long Arc Quad: AAROM, 10 reps, Seated Knee Flexion: AAROM, 10 reps, Seated Goniometric ROM: 10-50 left knee    General Comments        Pertinent Vitals/Pain Pain Assessment Pain Score: 5  Pain Location: L posterior knee, thigh Pain Descriptors / Indicators: Aching, Discomfort Pain Intervention(s): Monitored during session, Premedicated before session, Ice applied    Home  Living                          Prior Function            PT Goals (current goals can now be found in the care plan section) Progress towards PT goals: Progressing toward goals    Frequency    7X/week      PT Plan      Co-evaluation              AM-PAC PT "6 Clicks" Mobility   Outcome Measure  Help needed turning from your back to your side while in a flat bed without using bedrails?: A Little Help needed moving from lying on your back to sitting on the side of a flat bed without using bedrails?: A Little Help needed moving to  and from a bed to a chair (including a wheelchair)?: A Little Help needed standing up from a chair using your arms (e.g., wheelchair or bedside chair)?: A Little Help needed to walk in hospital room?: A Little Help needed climbing 3-5 steps with a railing? : A Lot 6 Click Score: 15    End of Session Equipment Utilized During Treatment: Gait belt;Right knee immobilizer Activity Tolerance: Patient tolerated treatment well Patient left: in chair;with call bell/phone within reach Nurse Communication: Mobility status PT Visit Diagnosis: Other abnormalities of gait and mobility (R26.89);Pain;Difficulty in walking, not elsewhere classified (R26.2) Pain - Right/Left: Left Pain - part of body: Knee     Time: 1140-1206 PT Time Calculation (min) (ACUTE ONLY): 26 min  Charges:    $Gait Training: 23-37 mins PT General Charges $$ ACUTE PT VISIT: 1 Visit                     Blanchard Kelch PT Acute Rehabilitation Services Office [phone] Weekend [phone]    Rada Hay 12/02/2022, 2:28 PM

## 2022-12-02 NOTE — Progress Notes (Signed)
Physical Therapy Treatment Patient Details Name: [name] MRN: [id number] DOB: 09/11/1950 Today's Date: 12/02/2022   History of Present Illness 72 yo female s/p L TKA 11/29/22. 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  Daughter instructed on  step technique backwards with PFRW. Patient ready for DC.    If plan is discharge home, recommend the following: A little help with walking and/or transfers;A little help with bathing/dressing/bathroom;Assistance with cooking/housework;Assist for transportation;Help with stairs or ramp for entrance   Can travel by private vehicle        Equipment Recommendations   (platform)    Recommendations for Other Services       Precautions / Restrictions Precautions Precautions: Fall;Knee Precaution Comments: right wrist sprain Required Braces or Orthoses: Knee Immobilizer - Left Restrictions Weight Bearing Restrictions: No Other Position/Activity Restrictions: unable to WB on right wrist     Mobility  Bed Mobility Overal bed mobility: Needs Assistance Bed Mobility: Sit to Supine     Supine to sit: Min assist, HOB elevated, Used rails Sit to supine: Supervision   General bed mobility comments: in recliner    Transfers Overall transfer level: Needs assistance Equipment used: Right platform walker Transfers: Sit to/from Stand Sit to Stand: Contact guard assist           General transfer comment: able to push up from recliner with LUE to stand at PFRW from    Ambulation/Gait Ambulation/Gait assistance: Contact guard assist Gait Distance (Feet): 4 Feet Assistive device: Right platform walker Gait Pattern/deviations: Step-to pattern, Step-through pattern Gait velocity: decr     General Gait Details: patient stood to  have   new PFRW positioned to pt height.   Stairs Daughter instructed in technique on 2 steps with PFRW  Wheelchair Mobility     Tilt Bed    Modified Rankin (Stroke Patients Only)       Balance Overall balance assessment: Needs assistance Sitting-balance support: No upper extremity supported, Feet supported Sitting balance-[name] Scale: Good     Standing balance support: Bilateral upper extremity supported, During functional activity, Reliant on assistive device for balance Standing balance-[name] Scale: Fair Standing balance comment: standing and pericare                            Cognition Arousal: Alert                                              Exercises    General Comments        Pertinent Vitals/Pain Pain Assessment Pain Score: 6  Pain Location: L posterior knee, thigh Pain Descriptors / Indicators: Aching, Discomfort Pain Intervention(s): Monitored during session    Home Living                          Prior Function            PT Goals (current goals can now be found in the care plan section) Progress towards PT goals: Progressing toward goals    Frequency    7X/week  PT Plan      Co-evaluation              AM-PAC PT "6 Clicks" Mobility   Outcome Measure  Help needed turning from your back to your side while in a flat bed without using bedrails?: A Little Help needed moving from lying on your back to sitting on the side of a flat bed without using bedrails?: A Little Help needed moving to and from a bed to a chair (including a wheelchair)?: A Little Help needed standing up from a chair using your arms (e.g., wheelchair or bedside chair)?: A Little Help needed to walk in hospital room?: A Little Help needed climbing 3-5 steps with a railing? : A Little 6 Click Score: 18    End of Session Equipment Utilized During Treatment: Left knee immobilizer Activity Tolerance: Patient  tolerated treatment well Patient left: in chair;with call bell/phone within reach;with family/visitor present Nurse Communication: Mobility status PT Visit Diagnosis: Other abnormalities of gait and mobility (R26.89);Pain;Difficulty in walking, not elsewhere classified (R26.2) Pain - Right/Left: Left Pain - part of body: Knee     Time: 1540-1605 PT Time Calculation (min) (ACUTE ONLY): 25 min  Charges:   $Self Care/Home Management: 23-37 PT General Charges $$ ACUTE PT VISIT: 1 Visit                     Blanchard Kelch PT Acute Rehabilitation Services Office [phone] Weekend [phone]    Rada Hay 12/02/2022, 4:24 PM

## 2022-12-02 NOTE — Progress Notes (Signed)
Provided discharge education/instructions, all questions answered. Pt is not in any distress. Pt discharged home with all of her belongings accompanied by her daughter.

## 2022-12-03 DIAGNOSIS — Z7982 Long term (current) use of aspirin: Secondary | ICD-10-CM | POA: Diagnosis not present

## 2022-12-03 DIAGNOSIS — Z6830 Body mass index (BMI) 30.0-30.9, adult: Secondary | ICD-10-CM | POA: Diagnosis not present

## 2022-12-03 DIAGNOSIS — K219 Gastro-esophageal reflux disease without esophagitis: Secondary | ICD-10-CM | POA: Diagnosis not present

## 2022-12-03 DIAGNOSIS — M8580 Other specified disorders of bone density and structure, unspecified site: Secondary | ICD-10-CM | POA: Diagnosis not present

## 2022-12-03 DIAGNOSIS — Z79891 Long term (current) use of opiate analgesic: Secondary | ICD-10-CM | POA: Diagnosis not present

## 2022-12-03 DIAGNOSIS — Z96652 Presence of left artificial knee joint: Secondary | ICD-10-CM | POA: Diagnosis not present

## 2022-12-03 DIAGNOSIS — Z471 Aftercare following joint replacement surgery: Secondary | ICD-10-CM | POA: Diagnosis not present

## 2022-12-03 DIAGNOSIS — M12812 Other specific arthropathies, not elsewhere classified, left shoulder: Secondary | ICD-10-CM | POA: Diagnosis not present

## 2022-12-03 DIAGNOSIS — I700 Atherosclerosis of aorta: Secondary | ICD-10-CM | POA: Diagnosis not present

## 2022-12-03 DIAGNOSIS — E663 Overweight: Secondary | ICD-10-CM | POA: Diagnosis not present

## 2022-12-03 DIAGNOSIS — I10 Essential (primary) hypertension: Secondary | ICD-10-CM | POA: Diagnosis not present

## 2022-12-03 DIAGNOSIS — M549 Dorsalgia, unspecified: Secondary | ICD-10-CM | POA: Diagnosis not present

## 2022-12-03 DIAGNOSIS — E785 Hyperlipidemia, unspecified: Secondary | ICD-10-CM | POA: Diagnosis not present

## 2022-12-03 DIAGNOSIS — M1389 Other specified arthritis, multiple sites: Secondary | ICD-10-CM | POA: Diagnosis not present

## 2022-12-03 DIAGNOSIS — G8929 Other chronic pain: Secondary | ICD-10-CM | POA: Diagnosis not present

## 2022-12-05 ENCOUNTER — Encounter: Payer: Self-pay | Admitting: *Deleted

## 2022-12-05 ENCOUNTER — Telehealth: Payer: Self-pay | Admitting: Orthopedic Surgery

## 2022-12-05 ENCOUNTER — Telehealth: Payer: Self-pay | Admitting: *Deleted

## 2022-12-05 NOTE — Transitions of Care (Post Inpatient/ED Visit) (Signed)
12/05/2022  Name: [name] MRN: [id number] DOB: [birth year]  Today's TOC FU Call Status: Today's TOC FU Call Status:: Successful TOC FU Call Completed TOC FU Call Complete Date: 12/05/22 Patient's Name and Date of Birth confirmed.  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  My daughter is staying here and helping me with anything I need and the home PT has already come out to see me") Any questions or concerns?: No  Items Reviewed: Did you receive and understand the discharge instructions provided?: Yes (thoroughly reviewed with patient who verbalizes good understanding of same) Medications obtained,verified, and reconciled?: Yes (Medications Reviewed) (Full medication reconciliation/ review completed; no concerns or discrepancies identified; confirmed patient obtained/ is taking all newly Rx'd medications as instructed; self-manages medications and denies questions/ concerns around medications today) Any new allergies since your discharge?: No Dietary orders reviewed?: Yes Type of Diet Ordered:: "Regular" Do you have support at home?: Yes People in Home: spouse Name of Support/Comfort Primary Source: Reports independent in self-care activities; supportive daughter and spouse assists as/ if needed/ indicated  Medications Reviewed Today: Medications Reviewed Today     Reviewed by Michaela Corner, RN (Registered Nurse) on 12/05/22 at 1436  Med List Status: <None>   Medication Order Taking? Sig Documenting Provider Last Dose Status Informant  acetaminophen (TYLENOL) 500 MG tablet [id number] Yes Take 1,000-1,500 mg by mouth  every 8 (eight) hours as needed for moderate pain. [provider] Taking Active Self  ALPHA LIPOIC ACID PO [id number] Yes Take 1,400 mg by mouth daily. [provider] Taking Active Self  Ascorbic Acid (VITAMIN C) 1000 MG tablet [id number] Yes Take 1,000 mg by mouth daily. With zinc [provider] Taking Active Self  aspirin 81 MG chewable tablet [id number] Yes Chew 1 tablet (81 mg total) by mouth 2 (two) times daily. Magnant, Joycie Peek, PA-C Taking Active   atenolol-chlorthalidone (TENORETIC) 50-25 MG tablet [id number] Yes Take 1 tablet by mouth daily. Myrlene Broker, MD Taking Active Self           Med Note Marilu Favre Dec 05, 2022  2:22 PM) 12/05/22: Reports during [facility] call this medication is currently on hold after hospital discharge on 12/02/22  b complex vitamins tablet [id number] Yes Take 1 tablet by mouth at bedtime. [provider] Taking Active Self  calcium carbonate (OSCAL) 1500 (600 Ca) MG TABS tablet [id number] Yes Take 600 mg of elemental calcium by mouth daily. [provider] Taking Active Self  celecoxib (CELEBREX) 100 MG capsule [id number] Yes Take 1 capsule (100 mg total) by mouth 2 (two) times daily. Magnant, Joycie Peek, PA-C Taking Active   Cholecalciferol (D 5000) 5000 units capsule [id number] Yes Take 5,000 Units by mouth daily. [provider] Taking Active Self  Coenzyme Q10 (COQ10) 100 MG CAPS [id number] Yes Take 100 mg by mouth every evening.  [provider] Taking Active Self  diclofenac Sodium (VOLTAREN) 1 % GEL [id number] Yes Apply 1 Application topically 2 (two) times daily. [provider] Taking Active Self           Med Note Marilu Favre Dec 05, 2022  2:32  PM) 12/05/22: Reports during TOC call taking as needed only- has not needed recently   diphenoxylate-atropine (LOMOTIL) 2.5-0.025 MG tablet [id number] Yes TAKE 1 TABLET BY MOUTH FOUR TIMES A DAY AS NEEDED FOR DIARRHEA OR LOOSE  STOOLS  Patient taking differently: Take 1 tablet by mouth 4 (four) times daily as needed for diarrhea or loose stools.   Meryl Dare, MD Taking Active Self  docusate sodium (COLACE) 100 MG capsule [id number] Yes Take 1 capsule (100 mg total) by mouth 2 (two) times daily. Julieanne Cotton, PA-C Taking Active            Med Note Marilu Favre Dec 05, 2022  2:20 PM) 12/05/22: Reports during [facility] call has not needed recently  ELDERBERRY PO [id number] Yes Take 250 mg by mouth daily. [provider] Taking Active Self           Med Note Marilu Favre Dec 05, 2022  2:27 PM) 12/05/22: Reports during [facility] call taking as needed only- has not needed recently  folic acid (FOLVITE) 800 MCG tablet [id number] Yes Take 800 mcg by mouth every evening.  [provider] Taking Active Self  Glutathione 500 MG CAPS [id number] Yes Take 500 mg by mouth daily. [provider] Taking Active Self  ketotifen (ZADITOR) 0.025 % ophthalmic solution [id number] Yes Place 2 drops into both eyes 2 (two) times daily as needed (allergies). [provider] Taking Active Self           Med Note Marilu Favre Dec 05, 2022  2:28 PM) 12/05/22: Reports during [facility] call taking as needed only- has not needed recently   KLOR-CON M20 20 MEQ tablet [id number] Yes TAKE 1 TABLET TWICE A DAY Myrlene Broker, MD Taking Active Self  loperamide (IMODIUM A-D) 2 MG tablet [id number] Yes Take 2-4 mg by mouth 4 (four) times daily as needed for diarrhea or loose stools. [provider] Taking Active Self  Melatonin 3 MG TABS [id number] Yes Take 3 mg by mouth at bedtime.  [provider] Taking Active Self  METAMUCIL FIBER PO [id number] Yes Take 2 tablets by mouth daily as needed (constipation). [provider] Taking Active Self           Med Note Marilu Favre Dec 05, 2022  2:29 PM) 12/05/22: Reports during [facility] call taking as needed only- has not needed  recently   methocarbamol (ROBAXIN) 500 MG tablet [id number] Yes TAKE 1 TABLET BY MOUTH EVERY 8 HOURS AS NEEDED FOR MUSCLE SPASMS  Patient taking differently: Take 500 mg by mouth every 8 (eight) hours as needed for muscle spasms.   Myrlene Broker, MD Taking Active Self           Med Note Joella Prince A   Wed Nov 09, 2022  3:26 PM) Last filled 2021, pt states she still takes this as needed  Multiple Vitamin (MULTIVITAMIN WITH MINERALS) TABS tablet [id number] Yes Take 1 tablet by mouth at bedtime. [provider] Taking Active Self  Multiple Vitamins-Minerals (ZINC PO) [id number] Yes Take 1 Dose by mouth daily. 1 dose = 1 dropper full of liquid zinc [provider] Taking Active Self  OVER THE COUNTER MEDICATION [id number] Yes Take 1 Dose by mouth at bedtime. Angelyn Punt [provider] Taking Active Self  OVER THE COUNTER MEDICATION [id number] Yes Take 1 Dose by mouth at bedtime. [place] liquid daily [provider] Taking Active Self  OVER THE COUNTER MEDICATION [id number] Yes Apply 1 Application topically at bedtime. Magnesium lotion [provider] Taking Active Self  oxyCODONE (OXY IR/ROXICODONE) 5 MG immediate release tablet [id number] Yes Take 1 tablet (5 mg total) by mouth every 4 (four) hours as needed for moderate pain (pain score 4-6). Magnant, Joycie Peek, PA-C Taking Active   saccharomyces boulardii (FLORASTOR) 250 MG capsule [id number] Yes Take 250 mg by mouth daily. [provider] Taking Active Self  simvastatin (ZOCOR) 20 MG tablet [id number] Yes Take 1 tablet (20 mg total) by mouth daily. Myrlene Broker, MD Taking Active Self  Sodium Fluoride (SODIUM FLUORIDE 5000 PPM) 1.1 % PSTE [id number] Yes Place 1 application onto teeth at bedtime. [provider] Taking Active Self  TURMERIC CURCUMIN PO [id number] Yes Take 630 mg by mouth daily. [provider] Taking Active Self           Home Care and  Equipment/Supplies: Were Home Health Services Ordered?: Yes Name of Home Health Agency:: [phone]- PT: pre-arranged through surgical team prior to hospital Has Agency set up a time to come to your home?: Yes First Home Health Visit Date: 12/04/22 Any new equipment or medical supplies ordered?: Yes Name of Medical supply agency?: Patient does not know-- the "knee machine they gave me when I left the hospital" "I am using it and making progress" Were you able to get the equipment/medical supplies?: Yes Do you have any questions related to the use of the equipment/supplies?: No  Functional Questionnaire: Do you need assistance with bathing/showering or dressing?: Yes (daughter assisting with care needs as indicated post-recent surgical TKR) Do you need assistance with meal preparation?: Yes (daughter assisting with care needs as indicated post-recent surgical TKR) Do you need assistance with eating?: No Do you have difficulty maintaining continence: No Do you need assistance with getting out of bed/getting out of a chair/moving?: Yes (daughter assisting with care needs as indicated post-recent surgical TKR) Do you have difficulty managing or taking your medications?: No  Follow up appointments reviewed: PCP Follow-up appointment confirmed?: NA (verified not indicated per hospital discharging provider discharge notes) Specialist Hospital Follow-up appointment confirmed?: Yes Date of Specialist follow-up appointment?: 12/14/22 Follow-Up Specialty Provider:: orthopedic surgeon Do you need transportation to your follow-up appointment?: No Do you understand care options if your condition(s) worsen?: Yes-patient verbalized understanding  SDOH Interventions Today    Flowsheet Row Most Recent Value  SDOH Interventions   Food Insecurity Interventions Intervention Not Indicated  Transportation Interventions Intervention Not Indicated  [normally drives self,  daughter and husband assisting  post-recent surgery]      TOC Interventions Today    Flowsheet Row Most Recent Value  TOC Interventions   TOC Interventions Discussed/Reviewed TOC Interventions Discussed      Interventions Today    Flowsheet Row Most Recent Value  Chronic Disease   Chronic disease during today's visit Other  [Surgical (L) TKR]  General Interventions   General Interventions Discussed/Reviewed General Interventions Discussed, Doctor Visits, Durable Medical Equipment (DME)  Doctor Visits Discussed/Reviewed PCP, Specialist, Doctor Visits Discussed  Durable Medical Equipment (DME) Bed side commode, Dan Humphreys  [confirmed currently requiring/ using assistive devices - walker post-recent surgical TKR,  confirmed using BSC]  PCP/Specialist Visits Compliance with follow-up visit  Exercise Interventions   Exercise Discussed/Reviewed Exercise Discussed  [facility] PT is active and has been initiated: encouraged patient's ongoing participation]  Nutrition Interventions   Nutrition Discussed/Reviewed Nutrition Discussed  Pharmacy Interventions  Pharmacy Dicussed/Reviewed Pharmacy Topics Discussed  [Full medication review with updating medication list in EHR per patient report]  Safety Interventions   Safety Discussed/Reviewed Fall Risk, Safety Discussed      Caryl Pina, RN, BSN, CCRN Alumnus RN CM Care Coordination/ Transition of Care- [facility] Care Management [phone]: direct office

## 2022-12-05 NOTE — Telephone Encounter (Signed)
[facility]PT requesting verbal orders 3 week 2, CB number [phone] Vinnie Langton

## 2022-12-05 NOTE — Telephone Encounter (Signed)
Called and gave verbal ok 

## 2022-12-05 NOTE — Telephone Encounter (Signed)
ok 

## 2022-12-06 DIAGNOSIS — Z471 Aftercare following joint replacement surgery: Secondary | ICD-10-CM | POA: Diagnosis not present

## 2022-12-06 DIAGNOSIS — M12812 Other specific arthropathies, not elsewhere classified, left shoulder: Secondary | ICD-10-CM | POA: Diagnosis not present

## 2022-12-06 DIAGNOSIS — M1389 Other specified arthritis, multiple sites: Secondary | ICD-10-CM | POA: Diagnosis not present

## 2022-12-06 DIAGNOSIS — I700 Atherosclerosis of aorta: Secondary | ICD-10-CM | POA: Diagnosis not present

## 2022-12-06 DIAGNOSIS — Z96652 Presence of left artificial knee joint: Secondary | ICD-10-CM | POA: Diagnosis not present

## 2022-12-06 DIAGNOSIS — G8929 Other chronic pain: Secondary | ICD-10-CM | POA: Diagnosis not present

## 2022-12-07 DIAGNOSIS — Z471 Aftercare following joint replacement surgery: Secondary | ICD-10-CM | POA: Diagnosis not present

## 2022-12-07 DIAGNOSIS — I700 Atherosclerosis of aorta: Secondary | ICD-10-CM | POA: Diagnosis not present

## 2022-12-07 DIAGNOSIS — G8929 Other chronic pain: Secondary | ICD-10-CM | POA: Diagnosis not present

## 2022-12-07 DIAGNOSIS — M1389 Other specified arthritis, multiple sites: Secondary | ICD-10-CM | POA: Diagnosis not present

## 2022-12-07 DIAGNOSIS — Z96652 Presence of left artificial knee joint: Secondary | ICD-10-CM | POA: Diagnosis not present

## 2022-12-07 DIAGNOSIS — M12812 Other specific arthropathies, not elsewhere classified, left shoulder: Secondary | ICD-10-CM | POA: Diagnosis not present

## 2022-12-08 ENCOUNTER — Other Ambulatory Visit: Payer: Self-pay | Admitting: *Deleted

## 2022-12-08 DIAGNOSIS — Z96652 Presence of left artificial knee joint: Secondary | ICD-10-CM

## 2022-12-09 ENCOUNTER — Telehealth: Payer: Self-pay | Admitting: *Deleted

## 2022-12-09 DIAGNOSIS — M1389 Other specified arthritis, multiple sites: Secondary | ICD-10-CM | POA: Diagnosis not present

## 2022-12-09 DIAGNOSIS — I700 Atherosclerosis of aorta: Secondary | ICD-10-CM | POA: Diagnosis not present

## 2022-12-09 DIAGNOSIS — G8929 Other chronic pain: Secondary | ICD-10-CM | POA: Diagnosis not present

## 2022-12-09 DIAGNOSIS — M12812 Other specific arthropathies, not elsewhere classified, left shoulder: Secondary | ICD-10-CM | POA: Diagnosis not present

## 2022-12-09 DIAGNOSIS — Z471 Aftercare following joint replacement surgery: Secondary | ICD-10-CM | POA: Diagnosis not present

## 2022-12-09 DIAGNOSIS — Z96652 Presence of left artificial knee joint: Secondary | ICD-10-CM | POA: Diagnosis not present

## 2022-12-09 NOTE — Telephone Encounter (Signed)
Ortho bundle call completed. 

## 2022-12-09 NOTE — Discharge Summary (Signed)
Physician Discharge Summary      Patient ID: [name] MRN: [id number] DOB/AGE: 72-Jun-1952 72 y.o.  Admit date: 11/29/2022 Discharge date: 12/02/2022  Admission Diagnoses:  Principal Problem:   S/P total knee replacement, left   Discharge Diagnoses:  Same  Surgeries: Procedure(s): LEFT TOTAL KNEE ARTHROPLASTY on 11/29/2022   Consultants:   Discharged Condition: Stable  Hospital Course: [name] is an 72 y.o. female who was admitted 11/29/2022 with a chief complaint of left knee pain, and found to have a diagnosis of left knee osteoarthritis.  They were brought to the operating room on 11/29/2022 and underwent the above named procedures.  Pt awoke from anesthesia without complication and was transferred to the floor. On POD1, patient's pain was overall controlled.  She did struggle to mobilize with physical therapy initially but this improved over the course of her stay to the point that she was ready for discharge home on 12/02/2022.  She was discharged on this date.  She had no red flag signs or symptoms throughout her stay..  Pt will f/u with Dr. August Saucer in clinic in ~2 weeks.   Antibiotics given:  Anti-infectives (From admission, onward)    Start     Dose/Rate Route Frequency Ordered Stop   11/29/22 1400  ceFAZolin (ANCEF) IVPB 1 g/50 mL premix        1 g 100 mL/hr over 30 Minutes Intravenous Every 8 hours 11/29/22 1119 11/30/22 0744   11/29/22 0817  vancomycin (VANCOCIN) powder  Status:  Discontinued          As needed 11/29/22 0817 11/29/22 1140   11/29/22 0600  ceFAZolin (ANCEF) IVPB 2g/100 mL premix        2 g 200 mL/hr over 30 Minutes Intravenous On call to O.R. 11/29/22 0540 11/29/22 0803     .  Recent vital signs:  Vitals:   12/02/22 1351 12/02/22 1656  BP: (!) 95/58 117/86  Pulse: 86 92  Resp: 20 19  Temp: 98.1 F (36.7 C) 99.3 F (37.4 C)  SpO2: 98% 95%    Recent laboratory studies:  Results for orders placed or performed during the hospital  encounter of 11/11/22  Surgical pcr screen   Specimen: Nasal Mucosa; Nasal Swab  Result Value Ref Range   MRSA, PCR NEGATIVE NEGATIVE   Staphylococcus aureus NEGATIVE NEGATIVE  Basic metabolic panel per protocol  Result Value Ref Range   Sodium 139 135 - 145 mmol/L   Potassium 3.6 3.5 - 5.1 mmol/L   Chloride 101 98 - 111 mmol/L   CO2 26 22 - 32 mmol/L   Glucose, Bld 93 70 - 99 mg/dL   BUN 20 8 - 23 mg/dL   Creatinine, Ser 6.57 0.44 - 1.00 mg/dL   Calcium 9.6 8.9 - 84.6 mg/dL   GFR, Estimated >96 >29 mL/min   Anion gap 12 5 - 15  CBC per protocol  Result Value Ref Range   WBC 7.8 4.0 - 10.5 K/uL   RBC 4.69 3.87 - 5.11 MIL/uL   Hemoglobin 10.8 (L) 12.0 - 15.0 g/dL   HCT 52.8 41.3 - 24.4 %   MCV 78.7 (L) 80.0 - 100.0 fL   MCH 23.0 (L) 26.0 - 34.0 pg   MCHC 29.3 (L) 30.0 - 36.0 g/dL   RDW 01.0 (H) 27.2 - 53.6 %   Platelets 264 150 - 400 K/uL   nRBC 0.0 0.0 - 0.2 %  Urinalysis, Complete w Microscopic -Urine, Clean Catch  Result Value Ref  Range   Color, Urine AMBER (A) YELLOW   APPearance HAZY (A) CLEAR   Specific Gravity, Urine 1.020 1.005 - 1.030   pH 6.0 5.0 - 8.0   Glucose, UA NEGATIVE NEGATIVE mg/dL   Hgb urine dipstick NEGATIVE NEGATIVE   Bilirubin Urine NEGATIVE NEGATIVE   Ketones, ur NEGATIVE NEGATIVE mg/dL   Protein, ur NEGATIVE NEGATIVE mg/dL   Nitrite NEGATIVE NEGATIVE   Leukocytes,Ua NEGATIVE NEGATIVE   RBC / HPF 0-5 0 - 5 RBC/hpf   WBC, UA 0-5 0 - 5 WBC/hpf   Bacteria, UA RARE (A) NONE SEEN   Squamous Epithelial / HPF 0-5 0 - 5 /HPF   Mucus PRESENT     Discharge Medications:   Allergies as of 12/02/2022       Reactions   Egg-derived Products Other (See Comments)   GI UPSET, JOINT PAIN, DIFF. SWALLOWING   Etodolac Other (See Comments)   ABD. CRAMPING   Fish-derived Products Other (See Comments)   GI UPSET, JOINT PAIN, DIFF. SWALLOWING    Omeprazole Other (See Comments)   ABD. CRAMPING   Pineapple Other (See Comments)   GI UPSET, JOINT PAIN, DIFF.  SWALLOWING   Gluten Meal Diarrhea   GI upset        Medication List     STOP taking these medications    aspirin EC 81 MG tablet Replaced by: aspirin 81 MG chewable tablet   MAGNESIUM PO   traMADol 50 MG tablet Commonly known as: Ultram       TAKE these medications    acetaminophen 500 MG tablet Commonly known as: TYLENOL Take 1,000-1,500 mg by mouth every 8 (eight) hours as needed for moderate pain. Notes to patient: Last dose given 08/21 06:38am   ALPHA LIPOIC ACID PO Take 1,400 mg by mouth daily. Notes to patient: Resume home regimen   aspirin 81 MG chewable tablet Chew 1 tablet (81 mg total) by mouth 2 (two) times daily. Replaces: aspirin EC 81 MG tablet   atenolol-chlorthalidone 50-25 MG tablet Commonly known as: TENORETIC Take 1 tablet by mouth daily. Notes to patient: Resume home regimen   b complex vitamins tablet Take 1 tablet by mouth at bedtime. Notes to patient: Resume home regimen   calcium carbonate 1500 (600 Ca) MG Tabs tablet Commonly known as: OSCAL Take 600 mg of elemental calcium by mouth daily. Notes to patient: Resume home regimen   celecoxib 100 MG capsule Commonly known as: CELEBREX Take 1 capsule (100 mg total) by mouth 2 (two) times daily.   CoQ10 100 MG Caps Take 100 mg by mouth every evening. Notes to patient: Resume home regimen   D 5000 125 MCG (5000 UT) capsule Generic drug: Cholecalciferol Take 5,000 Units by mouth daily.   diphenoxylate-atropine 2.5-0.025 MG tablet Commonly known as: LOMOTIL TAKE 1 TABLET BY MOUTH FOUR TIMES A DAY AS NEEDED FOR DIARRHEA OR LOOSE STOOLS What changed: See the new instructions. Notes to patient: Resume home regimen   docusate sodium 100 MG capsule Commonly known as: COLACE Take 1 capsule (100 mg total) by mouth 2 (two) times daily.   ELDERBERRY PO Take 250 mg by mouth daily. Notes to patient: Resume home regimen   folic acid 800 MCG tablet Commonly known as: FOLVITE Take 800  mcg by mouth every evening. Notes to patient: Resume home regimen   Glutathione 500 MG Caps Take 500 mg by mouth daily. Notes to patient: Resume home regimen   ketotifen 0.025 % ophthalmic solution Commonly known as:  ZADITOR Place 2 drops into both eyes 2 (two) times daily as needed (allergies). Notes to patient: Resume home regimen   Klor-Con M20 20 MEQ tablet Generic drug: potassium chloride SA TAKE 1 TABLET TWICE A DAY   loperamide 2 MG tablet Commonly known as: IMODIUM A-D Take 2-4 mg by mouth 4 (four) times daily as needed for diarrhea or loose stools. Notes to patient: Resume home regimen   melatonin 3 MG Tabs tablet Take 3 mg by mouth at bedtime.   METAMUCIL FIBER PO Take 2 tablets by mouth daily as needed (constipation). Notes to patient: Resume home regimen   methocarbamol 500 MG tablet Commonly known as: ROBAXIN TAKE 1 TABLET BY MOUTH EVERY 8 HOURS AS NEEDED FOR MUSCLE SPASMS What changed:  how much to take how to take this when to take this reasons to take this additional instructions Notes to patient: Last dose given 08/23 03:32am   multivitamin with minerals Tabs tablet Take 1 tablet by mouth at bedtime. Notes to patient: Resume home regimen   OVER THE COUNTER MEDICATION Take 1 Dose by mouth at bedtime. Lemon Balm Notes to patient: Resume home regimen   OVER THE COUNTER MEDICATION Take 1 Dose by mouth at bedtime. Cats Claw liquid daily Notes to patient: Resume home regimen   OVER THE COUNTER MEDICATION Apply 1 Application topically at bedtime. Magnesium lotion Notes to patient: Resume home regimen   oxyCODONE 5 MG immediate release tablet Commonly known as: Oxy IR/ROXICODONE Take 1 tablet (5 mg total) by mouth every 4 (four) hours as needed for moderate pain (pain score 4-6). Notes to patient: Last dose given 08/23 09:17am   saccharomyces boulardii 250 MG capsule Commonly known as: FLORASTOR Take 250 mg by mouth daily. Notes to patient:  Resume home regimen   simvastatin 20 MG tablet Commonly known as: ZOCOR Take 1 tablet (20 mg total) by mouth daily. Notes to patient: Resume home regimen   Sodium Fluoride 5000 PPM 1.1 % Pste Generic drug: Sodium Fluoride Place 1 application onto teeth at bedtime. Notes to patient: Resume home regimen   TURMERIC CURCUMIN PO Take 630 mg by mouth daily. Notes to patient: Resume home regimen   vitamin C 1000 MG tablet Take 1,000 mg by mouth daily. With zinc Notes to patient: Resume home regimen   Voltaren 1 % Gel Generic drug: diclofenac Sodium Apply 1 Application topically 2 (two) times daily. Notes to patient: Resume home regimen   ZINC PO Take 1 Dose by mouth daily. 1 dose = 1 dropper full of liquid zinc Notes to patient: Resume home regimen        Diagnostic Studies: DG Wrist 2 Views Right  Result Date: 11/30/2022 CLINICAL DATA:  Fall EXAM: RIGHT WRIST - 2 VIEW COMPARISON:  None Available. FINDINGS: There is no evidence of fracture or dislocation. There is soft tissue swelling surrounding the wrist. Joint spaces are maintained. IMPRESSION: Soft tissue swelling without evidence of fracture or dislocation. Electronically Signed   By: Darliss Cheney M.D.   On: 11/30/2022 21:36    Disposition: Discharge disposition: 01-Home or Self Care       Discharge Instructions     Call MD / Call 911   Complete by: As directed    If you experience chest pain or shortness of breath, CALL 911 and be transported to the hospital emergency room.  If you develope a fever above 101 F, pus (white drainage) or increased drainage or redness at the wound, or calf pain, call  your surgeon's office.   Constipation Prevention   Complete by: As directed    Drink plenty of fluids.  Prune juice may be helpful.  You may use a stool softener, such as Colace (over the counter) 100 mg twice a day.  Use MiraLax (over the counter) for constipation as needed.   Diet - low sodium heart healthy   Complete  by: As directed    Discharge instructions   Complete by: As directed    You may shower, dressing is waterproof.  Do not remove the dressing, we will remove it at your first post-op appointment.  Do not take a bath or soak the knee in a tub or pool.  You may weightbear as you can tolerate on the operative leg with a walker.  Continue using the CPM machine 3 times per day for one hour each time, increasing the degrees of range of motion daily.  Use the blue cradle boot under your heel to work on getting your leg straight.  Do NOT put a pillow under your knee.  You will follow-up with Dr. August Saucer in the clinic in 2 weeks at your given appointment date.    INSTRUCTIONS AFTER JOINT REPLACEMENT   Remove items at home which could result in a fall. This includes throw rugs or furniture in walking pathways ICE to the affected joint every three hours while awake for 30 minutes at a time, for at least the first 3-5 days, and then as needed for pain and swelling.  Continue to use ice for pain and swelling. You may notice swelling that will progress down to the foot and ankle.  This is normal after surgery.  Elevate your leg when you are not up walking on it.   Continue to use the breathing machine you got in the hospital (incentive spirometer) which will help keep your temperature down.  It is common for your temperature to cycle up and down following surgery, especially at night when you are not up moving around and exerting yourself.  The breathing machine keeps your lungs expanded and your temperature down.   DIET:  As you were doing prior to hospitalization, we recommend a well-balanced diet.  DRESSING / WOUND CARE / SHOWERING  Keep the surgical dressing until follow up.  The dressing is water proof, so you can shower without any extra covering.  IF THE DRESSING FALLS OFF or the wound gets wet inside, change the dressing with sterile gauze.  Please use good hand washing techniques before changing the dressing.   Do not use any lotions or creams on the incision until instructed by your surgeon.    ACTIVITY  Increase activity slowly as tolerated, but follow the weight bearing instructions below.   No driving for 6 weeks or until further direction given by your physician.  You cannot drive while taking narcotics.  No lifting or carrying greater than 10 lbs. until further directed by your surgeon. Avoid periods of inactivity such as sitting longer than an hour when not asleep. This helps prevent blood clots.  You may return to work once you are authorized by your doctor.     WEIGHT BEARING   Weight bearing as tolerated with assist device (walker, cane, etc) as directed, use it as long as suggested by your surgeon or therapist, typically at least 4-6 weeks.   EXERCISES  Results after joint replacement surgery are often greatly improved when you follow the exercise, range of motion and muscle strengthening exercises prescribed by  your doctor. Safety measures are also important to protect the joint from further injury. Any time any of these exercises cause you to have increased pain or swelling, decrease what you are doing until you are comfortable again and then slowly increase them. If you have problems or questions, call your caregiver or physical therapist for advice.   Rehabilitation is important following a joint replacement. After just a few days of immobilization, the muscles of the leg can become weakened and shrink (atrophy).  These exercises are designed to build up the tone and strength of the thigh and leg muscles and to improve motion. Often times heat used for twenty to thirty minutes before working out will loosen up your tissues and help with improving the range of motion but do not use heat for the first two weeks following surgery (sometimes heat can increase post-operative swelling).   These exercises can be done on a training (exercise) mat, on the floor, on a table or on a bed. Use  whatever works the best and is most comfortable for you.    Use music or television while you are exercising so that the exercises are a pleasant break in your day. This will make your life better with the exercises acting as a break in your routine that you can look forward to.   Perform all exercises about fifteen times, three times per day or as directed.  You should exercise both the operative leg and the other leg as well.  Exercises include:   Quad Sets - Tighten up the muscle on the front of the thigh (Quad) and hold for 5-10 seconds.   Straight Leg Raises - With your knee straight (if you were given a brace, keep it on), lift the leg to 60 degrees, hold for 3 seconds, and slowly lower the leg.  Perform this exercise against resistance later as your leg gets stronger.  Leg Slides: Lying on your back, slowly slide your foot toward your buttocks, bending your knee up off the floor (only go as far as is comfortable). Then slowly slide your foot back down until your leg is flat on the floor again.  Angel Wings: Lying on your back spread your legs to the side as far apart as you can without causing discomfort.  Hamstring Strength:  Lying on your back, push your heel against the floor with your leg straight by tightening up the muscles of your buttocks.  Repeat, but this time bend your knee to a comfortable angle, and push your heel against the floor.  You may put a pillow under the heel to make it more comfortable if necessary.   A rehabilitation program following joint replacement surgery can speed recovery and prevent re-injury in the future due to weakened muscles. Contact your doctor or a physical therapist for more information on knee rehabilitation.    CONSTIPATION  Constipation is defined medically as fewer than three stools per week and severe constipation as less than one stool per week.  Even if you have a regular bowel pattern at home, your normal regimen is likely to be disrupted due to  multiple reasons following surgery.  Combination of anesthesia, postoperative narcotics, change in appetite and fluid intake all can affect your bowels.   YOU MUST use at least one of the following options; they are listed in order of increasing strength to get the job done.  They are all available over the counter, and you may need to use some, POSSIBLY even all  of these options:    Drink plenty of fluids (prune juice may be helpful) and high fiber foods Colace 100 mg by mouth twice a day  Senokot for constipation as directed and as needed Dulcolax (bisacodyl), take with full glass of water  Miralax (polyethylene glycol) once or twice a day as needed.  If you have tried all these things and are unable to have a bowel movement in the first 3-4 days after surgery call either your surgeon or your primary doctor.    If you experience loose stools or diarrhea, hold the medications until you stool forms back up.  If your symptoms do not get better within 1 week or if they get worse, check with your doctor.  If you experience "the worst abdominal pain ever" or develop nausea or vomiting, please contact the office immediately for further recommendations for treatment.   ITCHING:  If you experience itching with your medications, try taking only a single pain pill, or even half a pain pill at a time.  You can also use Benadryl over the counter for itching or also to help with sleep.   TED HOSE STOCKINGS:  Use stockings on both legs until for at least 2 weeks or as directed by physician office. They may be removed at night for sleeping.  MEDICATIONS:  See your medication summary on the "After Visit Summary" that nursing will review with you.  You may have some home medications which will be placed on hold until you complete the course of blood thinner medication.  It is important for you to complete the blood thinner medication as prescribed.  PRECAUTIONS:  If you experience chest pain or shortness of  breath - call 911 immediately for transfer to the hospital emergency department.   If you develop a fever greater that 101 F, purulent drainage from wound, increased redness or drainage from wound, foul odor from the wound/dressing, or calf pain - CONTACT YOUR SURGEON.                                                   FOLLOW-UP APPOINTMENTS:  If you do not already have a post-op appointment, please call the office for an appointment to be seen by your surgeon.  Guidelines for how soon to be seen are listed in your "After Visit Summary", but are typically between 1-4 weeks after surgery.  OTHER INSTRUCTIONS:   Knee Replacement:  Do not place pillow under knee, focus on keeping the knee straight while resting. CPM instructions: 0-90 degrees, 2 hours in the morning, 2 hours in the afternoon, and 2 hours in the evening. Place foam block, curve side up under heel at all times except when in CPM or when walking.  DO NOT modify, tear, cut, or change the foam block in any way.  POST-OPERATIVE OPIOID TAPER INSTRUCTIONS: It is important to wean off of your opioid medication as soon as possible. If you do not need pain medication after your surgery it is ok to stop day one. Opioids include: Codeine, Hydrocodone(Norco, Vicodin), Oxycodone(Percocet, oxycontin) and hydromorphone amongst others.  Long term and even short term use of opiods can cause: Increased pain response Dependence Constipation Depression Respiratory depression And more.  Withdrawal symptoms can include Flu like symptoms Nausea, vomiting And more Techniques to manage these symptoms Hydrate well Eat regular healthy meals Stay  active Use relaxation techniques(deep breathing, meditating, yoga) Do Not substitute Alcohol to help with tapering If you have been on opioids for less than two weeks and do not have pain than it is ok to stop all together.  Plan to wean off of opioids This plan should start within one week post op of your  joint replacement. Maintain the same interval or time between taking each dose and first decrease the dose.  Cut the total daily intake of opioids by one tablet each day Next start to increase the time between doses. The last dose that should be eliminated is the evening dose.   MAKE SURE YOU:  Understand these instructions.  Get help right away if you are not doing well or get worse.    Thank you for letting us be a part of your medical care team.  It is a privilege we respect greatly.  We hope these instructions will help you stay on track for a fast and full recovery!    Dental Antibiotics:  In most cases prophylactic antibiotics for Dental procdeures after total joint surgery are not necessary.  Exceptions are as follows:  1. History of prior total joint infection  2. Severely immunocompromised (Organ Transplant, cancer chemotherapy, Rheumatoid biologic meds such as Humera)  3. Poorly controlled diabetes (A1C &gt; 8.0, blood glucose over 200)  If you have one of these conditions, contact your surgeon for an antibiotic prescription, prior to your dental procedure.   Increase activity slowly as tolerated   Complete by: As directed    Post-operative opioid taper instructions:   Complete by: As directed    POST-OPERATIVE OPIOID TAPER INSTRUCTIONS: It is important to wean off of your opioid medication as soon as possible. If you do not need pain medication after your surgery it is ok to stop day one. Opioids include: Codeine, Hydrocodone(Norco, Vicodin), Oxycodone(Percocet, oxycontin) and hydromorphone amongst others.  Long term and even short term use of opiods can cause: Increased pain response Dependence Constipation Depression Respiratory depression And more.  Withdrawal symptoms can include Flu like symptoms Nausea, vomiting And more Techniques to manage these symptoms Hydrate well Eat regular healthy meals Stay active Use relaxation techniques(deep breathing,  meditating, yoga) Do Not substitute Alcohol to help with tapering If you have been on opioids for less than two weeks and do not have pain than it is ok to stop all together.  Plan to wean off of opioids This plan should start within one week post op of your joint replacement. Maintain the same interval or time between taking each dose and first decrease the dose.  Cut the total daily intake of opioids by one tablet each day Next start to increase the time between doses. The last dose that should be eliminated is the evening dose.           Follow-up Information     [facility] Follow up.   Why: Iantha Fallen will provide PT in the home after discharge. Contact information: [address] [place] [address] [phone]                  Signed: Karenann Cai 12/09/2022, 8:13 AM

## 2022-12-13 DIAGNOSIS — Z96652 Presence of left artificial knee joint: Secondary | ICD-10-CM | POA: Diagnosis not present

## 2022-12-13 DIAGNOSIS — I700 Atherosclerosis of aorta: Secondary | ICD-10-CM | POA: Diagnosis not present

## 2022-12-13 DIAGNOSIS — M12812 Other specific arthropathies, not elsewhere classified, left shoulder: Secondary | ICD-10-CM | POA: Diagnosis not present

## 2022-12-13 DIAGNOSIS — G8929 Other chronic pain: Secondary | ICD-10-CM | POA: Diagnosis not present

## 2022-12-13 DIAGNOSIS — Z471 Aftercare following joint replacement surgery: Secondary | ICD-10-CM | POA: Diagnosis not present

## 2022-12-13 DIAGNOSIS — M1389 Other specified arthritis, multiple sites: Secondary | ICD-10-CM | POA: Diagnosis not present

## 2022-12-14 ENCOUNTER — Ambulatory Visit (INDEPENDENT_AMBULATORY_CARE_PROVIDER_SITE_OTHER): Payer: Medicare Other | Admitting: Surgical

## 2022-12-14 ENCOUNTER — Encounter: Payer: Self-pay | Admitting: Surgical

## 2022-12-14 ENCOUNTER — Other Ambulatory Visit (INDEPENDENT_AMBULATORY_CARE_PROVIDER_SITE_OTHER): Payer: Medicare Other

## 2022-12-14 ENCOUNTER — Other Ambulatory Visit (INDEPENDENT_AMBULATORY_CARE_PROVIDER_SITE_OTHER): Payer: Self-pay

## 2022-12-14 DIAGNOSIS — M25531 Pain in right wrist: Secondary | ICD-10-CM

## 2022-12-14 DIAGNOSIS — Z96652 Presence of left artificial knee joint: Secondary | ICD-10-CM | POA: Diagnosis not present

## 2022-12-14 MED ORDER — CELECOXIB 100 MG PO CAPS: 100.0000 mg | ORAL_CAPSULE | Freq: Two times a day (BID) | ORAL | 0 refills | Status: DC

## 2022-12-14 MED ORDER — OXYCODONE HCL 5 MG PO TABS: 5.0000 mg | ORAL_TABLET | Freq: Three times a day (TID) | ORAL | 0 refills | Status: DC | PRN

## 2022-12-15 DIAGNOSIS — G8929 Other chronic pain: Secondary | ICD-10-CM | POA: Diagnosis not present

## 2022-12-15 DIAGNOSIS — M12812 Other specific arthropathies, not elsewhere classified, left shoulder: Secondary | ICD-10-CM | POA: Diagnosis not present

## 2022-12-15 DIAGNOSIS — I700 Atherosclerosis of aorta: Secondary | ICD-10-CM | POA: Diagnosis not present

## 2022-12-15 DIAGNOSIS — Z471 Aftercare following joint replacement surgery: Secondary | ICD-10-CM | POA: Diagnosis not present

## 2022-12-15 DIAGNOSIS — Z96652 Presence of left artificial knee joint: Secondary | ICD-10-CM | POA: Diagnosis not present

## 2022-12-15 DIAGNOSIS — M1389 Other specified arthritis, multiple sites: Secondary | ICD-10-CM | POA: Diagnosis not present

## 2022-12-16 ENCOUNTER — Encounter: Payer: Self-pay | Admitting: Surgical

## 2022-12-16 DIAGNOSIS — G8929 Other chronic pain: Secondary | ICD-10-CM | POA: Diagnosis not present

## 2022-12-16 DIAGNOSIS — Z471 Aftercare following joint replacement surgery: Secondary | ICD-10-CM | POA: Diagnosis not present

## 2022-12-16 DIAGNOSIS — Z96652 Presence of left artificial knee joint: Secondary | ICD-10-CM | POA: Diagnosis not present

## 2022-12-16 DIAGNOSIS — I700 Atherosclerosis of aorta: Secondary | ICD-10-CM | POA: Diagnosis not present

## 2022-12-16 DIAGNOSIS — M1389 Other specified arthritis, multiple sites: Secondary | ICD-10-CM | POA: Diagnosis not present

## 2022-12-16 DIAGNOSIS — M12812 Other specific arthropathies, not elsewhere classified, left shoulder: Secondary | ICD-10-CM | POA: Diagnosis not present

## 2022-12-16 NOTE — Progress Notes (Signed)
Post-Op Visit Note   Patient: [name]           Date of Birth: 1950/06/17           MRN: [id number] Visit Date: 12/14/2022 PCP: Myrlene Broker, MD   Assessment & Plan:  Chief Complaint:  Chief Complaint  Patient presents with   Left Knee - Follow-up    Left total knee arthroplasty 11/29/2022   Visit Diagnoses:  1. S/P total knee replacement, left   2. Pain in right wrist     Plan: [name] is a 72 y.o. female who presents s/p left total knee arthroplasty on 11/29/2022.  Doing well overall.  Using CPM machine up to 113 degrees.  They deny any calf pain, shortness of breath, chest pain, abdominal pain.  Pain is overall controlled and taking Tylenol and then oxycodone once daily for pain control.  Taking aspirin for DVT prophylaxis.  Ambulating with platform walker.  Doing home health physical therapy 3 times a week.  She has been set up for outpatient PT by Ralph Dowdy.   On exam, patient has range of motion 3 degrees extension to 95 degrees knee flexion.  Incision is healing well without evidence of infection or dehiscence.  2+ DP pulse of the operative extremity.  No calf tenderness, negative Homans' sign.  Able to perform straight leg raise.  Intact ankle dorsiflexion.  Plan is continue with range of motion exercises and mobilization.  Refilled oxycodone and Celebrex.  Follow-up in 4 weeks for clinical recheck.  She will start outpatient therapy.  Call with any concerns..    She has some continued right wrist pain that she localizes to the base of the thumb.  She had a fall the day after surgery with increased wrist pain necessitating platform walker.  She had radiographs of the right wrist in the hospital demonstrating no fracture or dislocation.  Repeat radiographs today demonstrate no fracture or dislocation once again but she does have moderate to severe degenerative changes of the first [facility] joint which is likely the source of her pain based on exam today.   She has no tenderness of the anatomic snuffbox but does have tenderness at the base of the first metacarpal.  No tenderness over the scaphoid tubercle.  Does have pain with thumb circumduction.  Mildly swollen compared with contralateral side.  She will see if this pain will continue to improve as it has over the last 2 weeks.  If she has continued pain in 4 weeks when she sees Dr. August Saucer, could consider CMC injection at that time.  Follow-Up Instructions: No follow-ups on file.   Orders:  Orders Placed This Encounter  Procedures   XR Knee 1-2 Views Left   XR Wrist Complete Right   Meds ordered this encounter  Medications   celecoxib (CELEBREX) 100 MG capsule    Sig: Take 1 capsule (100 mg total) by mouth 2 (two) times daily.    Dispense:  60 capsule    Refill:  0   oxyCODONE (OXY IR/ROXICODONE) 5 MG immediate release tablet    Sig: Take 1 tablet (5 mg total) by mouth every 8 (eight) hours as needed for moderate pain (pain score 4-6).    Dispense:  30 tablet    Refill:  0    Imaging: No results found.  PMFS History: Patient Active Problem List   Diagnosis Date Noted   S/P total knee replacement, left 11/29/2022   Urinary incontinence 03/25/2022  ICH (intracerebral hemorrhage) (HCC) 02/13/2021   Iron deficiency 10/07/2020   Aortic atherosclerosis (HCC) 03/17/2020   Lung nodule seen on imaging study 02/24/2020   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   Chronic left shoulder pain 08/23/2018   Arthritis of left knee 11/02/2016   Chronic diarrhea 12/07/2014   Segmental colitis with rectal bleeding (HCC) 09/12/2014   Routine general medical examination at a health care facility 06/05/2014   Overweight 06/13/2011   Hyperlipidemia 02/17/2007   Essential hypertension 01/08/2007   Osteoarthritis 01/08/2007   Past Medical History:  Diagnosis Date   Arthritis    back, fingers with joint pain and swelling.  chronic back pain   Cataract    Chronic back pain    arthritis     Diverticulosis    Elevated cholesterol    takes Niacin daily and Simvastatin   GERD (gastroesophageal reflux disease) 06/2004   non-specific gastritis on EGD 06/2004   Headache(784.0)    occasionally   History of colon polyps 2004, 2009   2004:adenomatous. 2009 hyperplastic.    History of hiatal hernia    Hypertension    takes Tenoretic and Lisinopril daily   Mucoid cyst of joint 08/2013   right index finger   Neuromuscular disorder (HCC)    numbness in right leg after knee replacement   Osteopenia 01/2014   T score -1.1 FRAX 14%/0.5%. Stable from prior DEXA   PONV (postoperative nausea and vomiting)    Rotator cuff arthropathy    Left   Seasonal allergies    Sleep apnea    wears CPAP nightly   Stroke (HCC) 1998   x 2 - mild left-sided weakness   Urge incontinence    Uterine prolapse     Family History  Problem Relation Age of Onset   Hypertension Mother    Heart disease Mother    Diabetes Father    Hypertension Father    Heart disease Father    Stroke Father    Diabetes Maternal Aunt    Diabetes Paternal Grandmother    Hypertension Paternal Grandfather    Stroke Paternal Grandfather    Colon cancer Neg Hx    Esophageal cancer Neg Hx    Rectal cancer Neg Hx    Stomach cancer Neg Hx    Sleep apnea Neg Hx     Past Surgical History:  Procedure Laterality Date   BACK SURGERY     x 2   BELPHAROPTOSIS REPAIR Bilateral 12/14/2017   BUBBLE STUDY  02/16/2021   Procedure: BUBBLE STUDY;  Surgeon: Vesta Mixer, MD;  Location: [facility];  Service: Cardiovascular;;   CARPAL TUNNEL RELEASE Bilateral    Dr August Saucer   CATARACT EXTRACTION     CATARACT EXTRACTION Bilateral 10/2017   COLONOSCOPY  2004, 2009, 2014   FINGER SURGERY     GUM SURGERY     GYNECOLOGIC CRYOSURGERY     HYSTEROSCOPY WITH D & C  12/07/2010   with resection of endometrial polyp   JOINT REPLACEMENT     KNEE ARTHROSCOPY Left 2008   lip biopsy     done at MD office Fri 11/22/13   MASS EXCISION Right  08/15/2013   Procedure: RIGHT INDEX EXCISION MASS ;  Surgeon: Tami Ribas, MD;  Location: [facility];  Service: Orthopedics;  Laterality: Right;   NASAL SEPTUM SURGERY     OOPHORECTOMY Right 2000   REVERSE SHOULDER ARTHROPLASTY Left 12/11/2018   REVERSE SHOULDER ARTHROPLASTY Left 12/11/2018   Procedure: LEFT  REVERSE SHOULDER ARTHROPLASTY;  Surgeon: Cammy Copa, MD;  Location: [facility] OR;  Service: Orthopedics;  Laterality: Left;   SHOULDER ARTHROSCOPY WITH OPEN ROTATOR CUFF REPAIR AND DISTAL CLAVICLE ACROMINECTOMY Right 11/26/2013   Procedure: RIGHT SHOULDER ARTHROSCOPY WITH MINI OPEN ROTATOR CUFF REPAIR AND DISTAL CLAVICLE RESECTION, SUBACROMIAL DECOMPRESSION, POSSIBLE [facility] PATCH.;  Surgeon: Valeria Batman, MD;  Location: [facility] OR;  Service: Orthopedics;  Laterality: Right;   TEE WITHOUT CARDIOVERSION N/A 02/16/2021   Procedure: TRANSESOPHAGEAL ECHOCARDIOGRAM (TEE);  Surgeon: Elease Hashimoto Deloris Ping, MD;  Location: [facility]COPY;  Service: Cardiovascular;  Laterality: N/A;   TOTAL KNEE ARTHROPLASTY Right 03/21/2017   TOTAL KNEE ARTHROPLASTY Right 03/21/2017   Procedure: RIGHT TOTAL KNEE ARTHROPLASTY;  Surgeon: Valeria Batman, MD;  Location: [facility];  Service: Orthopedics;  Laterality: Right;   TOTAL KNEE ARTHROPLASTY Left 11/29/2022   Procedure: LEFT TOTAL KNEE ARTHROPLASTY;  Surgeon: Cammy Copa, MD;  Location: [facility];  Service: Orthopedics;  Laterality: Left;   TUBAL LIGATION     Social History   Occupational History   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

## 2022-12-19 DIAGNOSIS — Z471 Aftercare following joint replacement surgery: Secondary | ICD-10-CM | POA: Diagnosis not present

## 2022-12-19 DIAGNOSIS — G8929 Other chronic pain: Secondary | ICD-10-CM | POA: Diagnosis not present

## 2022-12-19 DIAGNOSIS — Z96652 Presence of left artificial knee joint: Secondary | ICD-10-CM | POA: Diagnosis not present

## 2022-12-19 DIAGNOSIS — M1389 Other specified arthritis, multiple sites: Secondary | ICD-10-CM | POA: Diagnosis not present

## 2022-12-19 DIAGNOSIS — M12812 Other specific arthropathies, not elsewhere classified, left shoulder: Secondary | ICD-10-CM | POA: Diagnosis not present

## 2022-12-19 DIAGNOSIS — I700 Atherosclerosis of aorta: Secondary | ICD-10-CM | POA: Diagnosis not present

## 2022-12-22 ENCOUNTER — Ambulatory Visit: Payer: Medicare Other | Attending: Orthopedic Surgery

## 2022-12-22 DIAGNOSIS — Z96652 Presence of left artificial knee joint: Secondary | ICD-10-CM | POA: Diagnosis not present

## 2022-12-22 DIAGNOSIS — M6281 Muscle weakness (generalized): Secondary | ICD-10-CM | POA: Diagnosis not present

## 2022-12-22 DIAGNOSIS — R2681 Unsteadiness on feet: Secondary | ICD-10-CM | POA: Diagnosis not present

## 2022-12-22 DIAGNOSIS — R269 Unspecified abnormalities of gait and mobility: Secondary | ICD-10-CM | POA: Insufficient documentation

## 2022-12-22 DIAGNOSIS — M25562 Pain in left knee: Secondary | ICD-10-CM | POA: Diagnosis not present

## 2022-12-22 DIAGNOSIS — M25662 Stiffness of left knee, not elsewhere classified: Secondary | ICD-10-CM | POA: Insufficient documentation

## 2022-12-22 NOTE — Therapy (Addendum)
OUTPATIENT PHYSICAL THERAPY LOWER EXTREMITY EVALUATION   Patient Name: [name] MRN: [id number] DOB:03/16/1951, 72 y.o., female Today's Date: 12/22/2022  END OF SESSION:  PT End of Session - 12/22/22 1449     Visit Number 1    Number of Visits 16    Date for PT Re-Evaluation 02/16/23    PT Start Time 1430    PT Stop Time 1525    PT Time Calculation (min) 55 min    Activity Tolerance Patient limited by pain    Behavior During Therapy [facility] for tasks assessed/performed             Past Medical History:  Diagnosis Date   Arthritis    back, fingers with joint pain and swelling.  chronic back pain   Cataract    Chronic back pain    arthritis    Diverticulosis    Elevated cholesterol    takes Niacin daily and Simvastatin   GERD (gastroesophageal reflux disease) 06/2004   non-specific gastritis on EGD 06/2004   Headache(784.0)    occasionally   History of colon polyps 2004, 2009   2004:adenomatous. 2009 hyperplastic.    History of hiatal hernia    Hypertension    takes Tenoretic and Lisinopril daily   Mucoid cyst of joint 08/2013   right index finger   Neuromuscular disorder (HCC)    numbness in right leg after knee replacement   Osteopenia 01/2014   T score -1.1 FRAX 14%/0.5%. Stable from prior DEXA   PONV (postoperative nausea and vomiting)    Rotator cuff arthropathy    Left   Seasonal allergies    Sleep apnea    wears CPAP nightly   Stroke (HCC) 1998   x 2 - mild left-sided weakness   Urge incontinence    Uterine prolapse    Past Surgical History:  Procedure Laterality Date   BACK SURGERY     x 2   BELPHAROPTOSIS REPAIR Bilateral 12/14/2017   BUBBLE STUDY  02/16/2021   Procedure: BUBBLE STUDY;  Surgeon: Vesta Mixer, MD;  Location: [facility] END[facility]COPY;  Service: Cardiovascular;;   CARPAL TUNNEL RELEASE Bilateral    Dr August Saucer   CATARACT EXTRACTION     CATARACT EXTRACTION Bilateral 10/2017   COLONOSCOPY  2004, 2009, 2014   FINGER SURGERY     GUM  SURGERY     GYNECOLOGIC CRYOSURGERY     HYSTEROSCOPY WITH D & C  12/07/2010   with resection of endometrial polyp   JOINT REPLACEMENT     KNEE ARTHROSCOPY Left 2008   lip biopsy     done at MD office Fri 11/22/13   MASS EXCISION Right 08/15/2013   Procedure: RIGHT INDEX EXCISION MASS ;  Surgeon: Tami Ribas, MD;  Location: [facility];  Service: Orthopedics;  Laterality: Right;   NASAL SEPTUM SURGERY     OOPHORECTOMY Right 2000   REVERSE SHOULDER ARTHROPLASTY Left 12/11/2018   REVERSE SHOULDER ARTHROPLASTY Left 12/11/2018   Procedure: LEFT REVERSE SHOULDER ARTHROPLASTY;  Surgeon: Cammy Copa, MD;  Location: [facility] OR;  Service: Orthopedics;  Laterality: Left;   SHOULDER ARTHROSCOPY WITH OPEN ROTATOR CUFF REPAIR AND DISTAL CLAVICLE ACROMINECTOMY Right 11/26/2013   Procedure: RIGHT SHOULDER ARTHROSCOPY WITH MINI OPEN ROTATOR CUFF REPAIR AND DISTAL CLAVICLE RESECTION, SUBACROMIAL DECOMPRESSION, POSSIBLE [facility] PATCH.;  Surgeon: Valeria Batman, MD;  Location: [facility];  Service: Orthopedics;  Laterality: Right;   TEE WITHOUT CARDIOVERSION N/A 02/16/2021   Procedure: TRANSESOPHAGEAL ECHOCARDIOGRAM (TEE);  Surgeon: Elease Hashimoto Deloris Ping, MD;  Location: [facility] ENDOSCOPY;  Service: Cardiovascular;  Laterality: N/A;   TOTAL KNEE ARTHROPLASTY Right 03/21/2017   TOTAL KNEE ARTHROPLASTY Right 03/21/2017   Procedure: RIGHT TOTAL KNEE ARTHROPLASTY;  Surgeon: Valeria Batman, MD;  Location: [facility];  Service: Orthopedics;  Laterality: Right;   TOTAL KNEE ARTHROPLASTY Left 11/29/2022   Procedure: LEFT TOTAL KNEE ARTHROPLASTY;  Surgeon: Cammy Copa, MD;  Location: [facility];  Service: Orthopedics;  Laterality: Left;   TUBAL LIGATION     Patient Active Problem List   Diagnosis Date Noted   S/P total knee replacement, left 11/29/2022   Urinary incontinence 03/25/2022   ICH (intracerebral hemorrhage) (HCC) 02/13/2021   Iron deficiency 10/07/2020   Aortic atherosclerosis (HCC) 03/17/2020    Lung nodule seen on imaging study 02/24/2020   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   Chronic left shoulder pain 08/23/2018   Arthritis of left knee 11/02/2016   Chronic diarrhea 12/07/2014   Segmental colitis with rectal bleeding (HCC) 09/12/2014   Routine general medical examination at a health care facility 06/05/2014   Overweight 06/13/2011   Hyperlipidemia 02/17/2007   Essential hypertension 01/08/2007   Osteoarthritis 01/08/2007    PCP: Myrlene Broker, MD  REFERRING PROVIDER: Cammy Copa, MD  REFERRING DIAG: S/P total knee replacement, left  THERAPY DIAG:  Total knee replacement status, left  Stiffness of left knee, not elsewhere classified  Acute pain of left knee  Gait difficulty  Rationale for Evaluation and Treatment: Rehabilitation  ONSET DATE: 11/29/2022  SUBJECTIVE:   SUBJECTIVE STATEMENT: Pt reports to PT s/p L TKA on 11/29/2022. She reports falling in hospital during her stay which also resulted in her hurting her R wrist; pt wearing a wrist brace upon arrival and using RW with armrest for R UE. Pt has had HHPT coming to visit 2-3 times a week since discharging from the hospital. Pt also reports mild numbness/tingling in the medial/lateral aspects of her L knee, as well as completely down her R leg from the knee down. Pt has hx of R TKA ~6 years ago.  PERTINENT HISTORY: Hx of R TKA (~6 years ago), reverse TSA on LUE, HTN PAIN:  Are you having pain? Yes: NPRS scale: 3/10 now/best, 8/10 at worst Pain location: L knee Aggravating factors: activity, exercise Relieving factors: Ice (polar care), tylenol, pt reports taking oxy at night PRN to help sleep  PRECAUTIONS: None  RED FLAGS: None   WEIGHT BEARING RESTRICTIONS: No  FALLS:  Has patient fallen in last 6 months? Yes. Number of falls 1  LIVING ENVIRONMENT: Lives with: lives with their spouse Lives in: House/apartment Stairs: Yes: Internal: 1 flight of steps (doesn't need to  use); External: 2 steps, rail on R going up Has following equipment at home: Single point cane and Walker - 4 wheeled  PLOF: Independent  PATIENT GOALS: Get back to being able to take care of/walk dogs, get back to walking without AD  NEXT MD VISIT: TBD  OBJECTIVE:   DIAGNOSTIC FINDINGS:  XR Wrist Complete Right:  PA, oblique, lateral, navicular views of right wrist reviewed.  No  fracture or dislocation noted.  Severe degenerative changes noted of the first [facility] joint with mild degenerative changes of the radiocarpal joint and STT joint.  Mild to moderate degenerative changes of the first MCP and IP joints.   PATIENT SURVEYS:  FOTO 43/54  COGNITION: Overall cognitive status: Within functional limits for tasks assessed     SENSATION: Not  formally tested however pt reports numbness down R LE ever since R TKA ~6 years ago  EDEMA:  Circumferential measurements:  - Right: Joint line = 45 cm, 2 inches superior to patella = 47 cm, 2 inches distal to patella = 40.5 cm  - Left: Joint line = 44 cm, 2 inches superior to patella = 49.5 cm, 2 inches distal to patella = 42 cm  POSTURE: flexed trunk   PALPATION: L knee incision site examined, no excess redness or warmth noted; mild swelling still present.   LOWER EXTREMITY ROM/:  R knee AROM: 0-102 degrees (in supine)  L knee AROM: 0-89 degrees (in supine); pt able to achieve 97 degrees active knee flexion in sitting   JOINT MOBILITY ASSESSMENTS:  L Tibiofemoral Joint:   - A/P and P/A glides hypomobile with tight end feel   - Medial/Lateral glides WNL   - IR hypomobile with soft end feel   - ER hypomobile with soft end feel, painful     LOWER EXTREMITY MMT:  MMT Right eval Left eval  Hip flexion 3+ (in supine) 3+ (in supine)  Hip extension 3 3-  Hip abduction 3 3  Hip adduction 3 3  Hip internal rotation    Hip external rotation    Knee flexion 3+   Knee extension    Ankle dorsiflexion    Ankle plantarflexion     Ankle inversion    Ankle eversion    Knee strength testing deferred on L side secondary to pain  FUNCTIONAL TESTS:  30 seconds chair stand test: 7 reps Timed up and go (TUG): 20 seconds, no AD  BALANCE TESTS: SLS: 5-6 seconds bilaterally, increased instability with L side  10+ seconds tandem stance bilaterally   GAIT: Distance walked: 20 feet during TUG Assistive device utilized: None Level of assistance: Complete Independence Comments: Slow, cautious gait pattern and speed. Slow turns.    TODAY'S TREATMENT:                                                                                                                              DATE: 12/22/2022   TA: 10 mins: PT discussed findings, goals, symptoms management and HEP to establish pt specific POC.  PATIENT EDUCATION:  Education details: Pt educated on HEP completion and frequency, educated to avoid using lotion at incision site until scarring/stitches have healed to avoid infection Person educated: Patient Education method: Medical illustrator Education comprehension: verbalized understanding  HOME EXERCISE PROGRAM: Seated knee flexion AROM/AAROM holds  ASSESSMENT:  CLINICAL IMPRESSION: Patient is a 72 y.o. female who was seen today for physical therapy evaluation and treatment for L TKA on 11/29/2022. Pt is currently limited by pain in her L knee with end-range knee flexion. She demonstrates limitations bilaterally (L > R) with knee flexion AROM, as well as significant weakness bilaterally in her hip musculature. Pt's incision site assessed and she demonstrates some mild swelling in her L knee compared  to the right. Incision site is healing well and has no excess redness, heat, or moisture. Joint mobility assessments revealed hypomobility with L A/P and P/A glides, as well as with IR and ER with soft end feel and pain. . Pt demonstrates mild instability with static and dynamic balance assessments without an AD. She  has good heel-strike bilaterally but overall demonstrates a very slow and cautious gait pattern. Pt will benefit from continued PT services upon discharge to safely address deficits listed in patient problem list for decreased caregiver assistance and eventual return to PLOF.   OBJECTIVE IMPAIRMENTS: decreased activity tolerance, decreased balance, decreased endurance, difficulty walking, decreased ROM, decreased strength, and pain.   ACTIVITY LIMITATIONS: squatting, stairs, transfers, and locomotion level  PARTICIPATION LIMITATIONS: cleaning, laundry, community activity, and yard work  PERSONAL FACTORS: Age and 3+ comorbidities: Hx of R TKA, hx of reverse TSA on L UE, R wrist pain  are also affecting patient's functional outcome.   REHAB POTENTIAL: Good  CLINICAL DECISION MAKING: Stable/uncomplicated  EVALUATION COMPLEXITY: Low   GOALS:  SHORT TERM GOALS: Target date: 01/19/2023 Pt will be independent and compliant with HEP in order to increase L knee flexion AROM to at least 110 degrees needed for improved functional mobility Baseline: 97 degrees AROM in sitting position Goal status: INITIAL  2.  Pt will report subjective improvement in L knee pain with exercise on NPS scale by at least 3 points in order to show clinically significant improvement in pain levels. Baseline: 8/10 Goal status: INITIAL    LONG TERM GOALS: Target date: 02/16/2023  Pt will improve FOTO to at least 54 in order to show improved pain and function with ADL's and functional mobility. Baseline: Initial 43 Goal status: INITIAL  2.  Pt will be able to improve rep count during 30 second sit-to-stand test to at least 14 in order to demonstrate improved functional independence. Baseline: 7 reps  Goal status: INITIAL  3.  Pt will decrease TUG time to at least 11 seconds in order to be below cutoff for fall-risk. Baseline: 20 seconds, no AD Goal status: INITIAL  4.  Pt will demonstrate at least 4/5 MMT  scores for hip strength bilaterally in order to show improvement in LE strength needed for improved mobility and independence. Baseline: See above for initial MMT scores Goal status: INITIAL  5.  Pt will report being able to walk for at least 30 minutes without an AD with no pain in order to show improved walking endurance. Baseline: Pt ambulates with RW now with limited walking endurance Goal status: INITIAL    PLAN:  PT FREQUENCY: 2x/week  PT DURATION: 8 weeks  PLANNED INTERVENTIONS: Therapeutic exercises, Therapeutic activity, Neuromuscular re-education, Balance training, Gait training, Patient/Family education, Joint mobilization, Stair training, Cryotherapy, Moist heat, and Manual therapy  PLAN FOR NEXT SESSION: Follow up on HEP, introduce progressions to LE strengthening exercises, progress L knee flexion AROM/PROM as pain tolerates,   Saman Umstead, SPT 12/22/22, 7:07 PM

## 2022-12-23 ENCOUNTER — Other Ambulatory Visit: Payer: Self-pay | Admitting: Internal Medicine

## 2022-12-27 ENCOUNTER — Ambulatory Visit: Payer: Medicare Other

## 2022-12-27 DIAGNOSIS — M6281 Muscle weakness (generalized): Secondary | ICD-10-CM

## 2022-12-27 DIAGNOSIS — Z96652 Presence of left artificial knee joint: Secondary | ICD-10-CM

## 2022-12-27 DIAGNOSIS — R269 Unspecified abnormalities of gait and mobility: Secondary | ICD-10-CM

## 2022-12-27 DIAGNOSIS — M25562 Pain in left knee: Secondary | ICD-10-CM

## 2022-12-27 DIAGNOSIS — M25662 Stiffness of left knee, not elsewhere classified: Secondary | ICD-10-CM

## 2022-12-27 DIAGNOSIS — R2681 Unsteadiness on feet: Secondary | ICD-10-CM | POA: Diagnosis not present

## 2022-12-27 NOTE — Therapy (Addendum)
OUTPATIENT PHYSICAL THERAPY LOWER EXTREMITY TREATMENT   Patient Name: [name] MRN: [id number] DOB:June 18, 1950, 72 y.o., female Today's Date: 12/27/2022  END OF SESSION:  PT End of Session - 12/27/22 1434     Visit Number 2    Number of Visits 16    Date for PT Re-Evaluation 02/16/23    PT Start Time 1345    PT Stop Time 1435    PT Time Calculation (min) 50 min    Activity Tolerance Patient limited by pain    Behavior During Therapy [facility] for tasks assessed/performed              Past Medical History:  Diagnosis Date   Arthritis    back, fingers with joint pain and swelling.  chronic back pain   Cataract    Chronic back pain    arthritis    Diverticulosis    Elevated cholesterol    takes Niacin daily and Simvastatin   GERD (gastroesophageal reflux disease) 06/2004   non-specific gastritis on EGD 06/2004   Headache(784.0)    occasionally   History of colon polyps 2004, 2009   2004:adenomatous. 2009 hyperplastic.    History of hiatal hernia    Hypertension    takes Tenoretic and Lisinopril daily   Mucoid cyst of joint 08/2013   right index finger   Neuromuscular disorder (HCC)    numbness in right leg after knee replacement   Osteopenia 01/2014   T score -1.1 FRAX 14%/0.5%. Stable from prior DEXA   PONV (postoperative nausea and vomiting)    Rotator cuff arthropathy    Left   Seasonal allergies    Sleep apnea    wears CPAP nightly   Stroke (HCC) 1998   x 2 - mild left-sided weakness   Urge incontinence    Uterine prolapse    Past Surgical History:  Procedure Laterality Date   BACK SURGERY     x 2   BELPHAROPTOSIS REPAIR Bilateral 12/14/2017   BUBBLE STUDY  02/16/2021   Procedure: BUBBLE STUDY;  Surgeon: Vesta Mixer, MD;  Location: [facility] ENDOSCOPY;  Service: Cardiovascular;;   CARPAL TUNNEL RELEASE Bilateral    Dr August Saucer   CATARACT EXTRACTION     CATARACT EXTRACTION Bilateral 10/2017   COLONOSCOPY  2004, 2009, 2014   FINGER SURGERY     GUM  SURGERY     GYNECOLOGIC CRYOSURGERY     HYSTEROSCOPY WITH D & C  12/07/2010   with resection of endometrial polyp   JOINT REPLACEMENT     KNEE ARTHROSCOPY Left 2008   lip biopsy     done at MD office Fri 11/22/13   MASS EXCISION Right 08/15/2013   Procedure: RIGHT INDEX EXCISION MASS ;  Surgeon: Tami Ribas, MD;  Location: [facility];  Service: Orthopedics;  Laterality: Right;   NASAL SEPTUM SURGERY     OOPHORECTOMY Right 2000   REVERSE SHOULDER ARTHROPLASTY Left 12/11/2018   REVERSE SHOULDER ARTHROPLASTY Left 12/11/2018   Procedure: LEFT REVERSE SHOULDER ARTHROPLASTY;  Surgeon: Cammy Copa, MD;  Location: [facility] OR;  Service: Orthopedics;  Laterality: Left;   SHOULDER ARTHROSCOPY WITH OPEN ROTATOR CUFF REPAIR AND DISTAL CLAVICLE ACROMINECTOMY Right 11/26/2013   Procedure: RIGHT SHOULDER ARTHROSCOPY WITH MINI OPEN ROTATOR CUFF REPAIR AND DISTAL CLAVICLE RESECTION, SUBACROMIAL DECOMPRESSION, POSSIBLE [facility] PATCH.;  Surgeon: Valeria Batman, MD;  Location: [facility] OR;  Service: Orthopedics;  Laterality: Right;   TEE WITHOUT CARDIOVERSION N/A 02/16/2021   Procedure: TRANSESOPHAGEAL ECHOCARDIOGRAM (TEE);  Surgeon: Elease Hashimoto Deloris Ping, MD;  Location: [facility] ENDOSCOPY;  Service: Cardiovascular;  Laterality: N/A;   TOTAL KNEE ARTHROPLASTY Right 03/21/2017   TOTAL KNEE ARTHROPLASTY Right 03/21/2017   Procedure: RIGHT TOTAL KNEE ARTHROPLASTY;  Surgeon: Valeria Batman, MD;  Location: [facility] OR;  Service: Orthopedics;  Laterality: Right;   TOTAL KNEE ARTHROPLASTY Left 11/29/2022   Procedure: LEFT TOTAL KNEE ARTHROPLASTY;  Surgeon: Cammy Copa, MD;  Location: [facility] ORS;  Service: Orthopedics;  Laterality: Left;   TUBAL LIGATION     Patient Active Problem List   Diagnosis Date Noted   S/P total knee replacement, left 11/29/2022   Urinary incontinence 03/25/2022   ICH (intracerebral hemorrhage) (HCC) 02/13/2021   Iron deficiency 10/07/2020   Aortic atherosclerosis (HCC) 03/17/2020    Lung nodule seen on imaging study 02/24/2020   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   Chronic left shoulder pain 08/23/2018   Arthritis of left knee 11/02/2016   Chronic diarrhea 12/07/2014   Segmental colitis with rectal bleeding (HCC) 09/12/2014   Routine general medical examination at a health care facility 06/05/2014   Overweight 06/13/2011   Hyperlipidemia 02/17/2007   Essential hypertension 01/08/2007   Osteoarthritis 01/08/2007    PCP: Myrlene Broker, MD  REFERRING PROVIDER: Cammy Copa, MD  REFERRING DIAG: S/P total knee replacement, left  THERAPY DIAG:  Total knee replacement status, left  Stiffness of left knee, not elsewhere classified  Acute pain of left knee  Gait difficulty  Muscle weakness (generalized)  Rationale for Evaluation and Treatment: Rehabilitation  ONSET DATE: 11/29/2022  SUBJECTIVE:   SUBJECTIVE STATEMENT: Pt reports to PT s/p L TKA on 11/29/2022. She reports falling in hospital during her stay which also resulted in her hurting her R wrist; pt wearing a wrist brace upon arrival and using RW with armrest for R UE. Pt has had HHPT coming to visit 2-3 times a week since discharging from the hospital. Pt also reports mild numbness/tingling in the medial/lateral aspects of her L knee, as well as completely down her R leg from the knee down. Pt has hx of R TKA ~6 years ago.  PERTINENT HISTORY: Hx of R TKA (~6 years ago), reverse TSA on LUE, HTN PAIN:  Are you having pain? Yes: NPRS scale: 3/10 now/best, 8/10 at worst Pain location: L knee Aggravating factors: activity, exercise Relieving factors: Ice (polar care), tylenol, pt reports taking oxy at night PRN to help sleep  PRECAUTIONS: None  RED FLAGS: None   WEIGHT BEARING RESTRICTIONS: No  FALLS:  Has patient fallen in last 6 months? Yes. Number of falls 1  LIVING ENVIRONMENT: Lives with: lives with their spouse Lives in: House/apartment Stairs: Yes: Internal: 1  flight of steps (doesn't need to use); External: 2 steps, rail on R going up Has following equipment at home: Single point cane and Walker - 4 wheeled  PLOF: Independent  PATIENT GOALS: Get back to being able to take care of/walk dogs, get back to walking without AD  NEXT MD VISIT: TBD  OBJECTIVE:   DIAGNOSTIC FINDINGS:  XR Wrist Complete Right:  PA, oblique, lateral, navicular views of right wrist reviewed.  No  fracture or dislocation noted.  Severe degenerative changes noted of the first [facility] joint with mild degenerative changes of the radiocarpal joint and STT joint.  Mild to moderate degenerative changes of the first MCP and IP joints.   PATIENT SURVEYS:  FOTO 43/54  COGNITION: Overall cognitive status: Within functional limits for tasks assessed  SENSATION: Not formally tested however pt reports numbness down R LE ever since R TKA ~6 years ago  EDEMA:  Circumferential measurements:  - Right: Joint line = 45 cm, 2 inches superior to patella = 47 cm, 2 inches distal to patella = 40.5 cm  - Left: Joint line = 44 cm, 2 inches superior to patella = 49.5 cm, 2 inches distal to patella = 42 cm  POSTURE: flexed trunk   PALPATION: L knee incision site examined, no excess redness or warmth noted; mild swelling still present.   LOWER EXTREMITY ROM/:  R knee AROM: 0-102 degrees (in supine)  L knee AROM: 0-89 degrees (in supine); pt able to achieve 97 degrees active knee flexion in sitting   JOINT MOBILITY ASSESSMENTS:  L Tibiofemoral Joint:   - A/P and P/A glides hypomobile with tight end feel   - Medial/Lateral glides WNL   - IR hypomobile with soft end feel   - ER hypomobile with soft end feel, painful   -Patellar glides me/lat/sup/inf     LOWER EXTREMITY MMT:  MMT Right eval Left eval  Hip flexion 3+ (in supine) 3+ (in supine)  Hip extension 3 3-  Hip abduction 3 3  Hip adduction 3 3  Hip internal rotation    Hip external rotation    Knee flexion 3+    Knee extension    Ankle dorsiflexion    Ankle plantarflexion    Ankle inversion    Ankle eversion    Knee strength testing deferred on L side secondary to pain  FUNCTIONAL TESTS:  30 seconds chair stand test: 7 reps Timed up and go (TUG): 20 seconds, no AD  BALANCE TESTS: SLS: 5-6 seconds bilaterally, increased instability with L side  10+ seconds tandem stance bilaterally   GAIT: Distance walked: 20 feet during TUG Assistive device utilized: None Level of assistance: Complete Independence Comments: Slow, cautious gait pattern and speed. Slow turns.    TODAY'S TREATMENT:                                                                                                                              DATE: 12/22/2022   Subjective: Pt reports to PT with 6-7/10 pain  in her L knee, she says she think it is related to the weather. She says the exercise she was doing over the weekend went ok, it made her a little sore but no significant increase in pain.   Therex: Nustep x 5 mins, lvl 4  : 776 feet with RW, 6/10 exertion rating  Supine SLR with 2# AW on LLE: 2 sets of 1 minute bouts  Standing Resisted Knee flexion on L side with 2# AW: 3 sets of 1 minute bouts  Sitting Full Arc Quad: 2 x 1 minute bouts  Manual Therapy: Supine L A/P + IR tibiofemoral glides: grades 2-3 x 3 minutes  Supine L superior/inferior patellofemoral glides: grades 2-3 x 3 minutes  Sitting  L knee flexion passive stretch holds: x10 reps - increased pain with end-range knee flexion    PATIENT EDUCATION:  Education details: Pt educated on HEP completion and frequency, educated to avoid using lotion at incision site until scarring/stitches have healed to avoid infection Person educated: Patient Education method: Medical illustrator Education comprehension: verbalized understanding  HOME EXERCISE PROGRAM: Seated knee flexion AROM/AAROM holds  HEP packet with 2# AW  Sidestepping with 2#  AW  ASSESSMENT:  CLINICAL IMPRESSION: Pt reports to PT with moderate L knee pain. completed at start of session; pt's distance is well-below age and gender matched normative values, demonstrating decreased cardiovascular and LE endurance. Manual tibiofemoral and patellofemoral glides performed on pt's L knee to improve joint stiffness and pt's knee flexion ROM; pt continues to experience increased L knee pain with end-range flexion. Exercises performed at end of session to strengthen pt's L hip flexor, quad, and hamstring muscles; she tolerates them well with reports of muscular fatigue and mild pain particularly with resisted knee flexion. Ankle weight exercises and sidestepping added to pt's HEP. Goals updated to include . Pt will benefit from continued PT services upon discharge to safely address deficits listed in patient problem list for decreased caregiver assistance and eventual return to PLOF.   OBJECTIVE IMPAIRMENTS: decreased activity tolerance, decreased balance, decreased endurance, difficulty walking, decreased ROM, decreased strength, and pain.   ACTIVITY LIMITATIONS: squatting, stairs, transfers, and locomotion level  PARTICIPATION LIMITATIONS: cleaning, laundry, community activity, and yard work  PERSONAL FACTORS: Age and 3+ comorbidities: Hx of R TKA, hx of reverse TSA on L UE, R wrist pain  are also affecting patient's functional outcome.   REHAB POTENTIAL: Good  CLINICAL DECISION MAKING: Stable/uncomplicated  EVALUATION COMPLEXITY: Low   GOALS:  SHORT TERM GOALS: Target date: 01/19/2023 Pt will be independent and compliant with HEP in order to increase L knee flexion AROM to at least 110 degrees needed for improved functional mobility Baseline: 97 degrees AROM in sitting position Goal status: INITIAL  2.  Pt will report subjective improvement in L knee pain with exercise on NPS scale by at least 3 points in order to show clinically significant improvement in  pain levels. Baseline: 8/10 Goal status: INITIAL    LONG TERM GOALS: Target date: 02/16/2023  Pt will improve FOTO to at least 54 in order to show improved pain and function with ADL's and functional mobility. Baseline: Initial 43 Goal status: INITIAL  2.  Pt will be able to improve rep count during 30 second sit-to-stand test to at least 14 in order to demonstrate improved functional independence. Baseline: 7 reps  Goal status: INITIAL  3.  Pt will decrease TUG time to at least 11 seconds in order to be below cutoff for fall-risk. Baseline: 20 seconds, no AD Goal status: INITIAL  4.  Pt will demonstrate at least 4/5 MMT scores for hip strength bilaterally in order to show improvement in LE strength needed for improved mobility and independence. Baseline: See above for initial MMT scores Goal status: INITIAL  5.  Pt will improve distance by at least 50 meters with LRAD in order to show improved cardiovascular and LE endurance. Baseline: 776 feet (12/27/2022) Goal status: INITIAL    PLAN:  PT FREQUENCY: 2x/week  PT DURATION: 8 weeks  PLANNED INTERVENTIONS: Therapeutic exercises, Therapeutic activity, Neuromuscular re-education, Balance training, Gait training, Patient/Family education, Joint mobilization, Stair training, Cryotherapy, Moist heat, and Manual therapy  PLAN FOR NEXT SESSION: Follow up on HEP,  progress LE strengthening exercises, progress L knee flexion AROM/PROM as pain tolerates (TAKE MEASUREMENT for knee flexion)   Shawon Denzer, SPT 12/27/22, 4:57 PM

## 2022-12-29 ENCOUNTER — Ambulatory Visit: Payer: Medicare Other

## 2022-12-29 DIAGNOSIS — M25662 Stiffness of left knee, not elsewhere classified: Secondary | ICD-10-CM

## 2022-12-29 DIAGNOSIS — M6281 Muscle weakness (generalized): Secondary | ICD-10-CM | POA: Diagnosis not present

## 2022-12-29 DIAGNOSIS — R269 Unspecified abnormalities of gait and mobility: Secondary | ICD-10-CM | POA: Diagnosis not present

## 2022-12-29 DIAGNOSIS — Z96652 Presence of left artificial knee joint: Secondary | ICD-10-CM

## 2022-12-29 DIAGNOSIS — R2681 Unsteadiness on feet: Secondary | ICD-10-CM | POA: Diagnosis not present

## 2022-12-29 DIAGNOSIS — M25562 Pain in left knee: Secondary | ICD-10-CM | POA: Diagnosis not present

## 2022-12-29 NOTE — Therapy (Signed)
OUTPATIENT PHYSICAL THERAPY LOWER EXTREMITY TREATMENT   Patient Name: [name] MRN: [id number] DOB:[birth year], 72 y.o., female Today's Date: 12/29/2022  END OF SESSION:  PT End of Session - 12/29/22 1444     Visit Number 3    Number of Visits 16    Date for PT Re-Evaluation 02/16/23    Authorization Type Medicare primary, [employer] secondary    PT Start Time 1345    PT Stop Time 1430    PT Time Calculation (min) 45 min    Activity Tolerance Patient limited by pain    Behavior During Therapy WFL for tasks assessed/performed               Past Medical History:  Diagnosis Date   Arthritis    back, fingers with joint pain and swelling.  chronic back pain   Cataract    Chronic back pain    arthritis    Diverticulosis    Elevated cholesterol    takes Niacin daily and Simvastatin   GERD (gastroesophageal reflux disease) 06/2004   non-specific gastritis on EGD 06/2004   Headache(784.0)    occasionally   History of colon polyps 2004, 2009   2004:adenomatous. 2009 hyperplastic.    History of hiatal hernia    Hypertension    takes Tenoretic and Lisinopril daily   Mucoid cyst of joint 08/2013   right index finger   Neuromuscular disorder (HCC)    numbness in right leg after knee replacement   Osteopenia 01/2014   T score -1.1 FRAX 14%/0.5%. Stable from prior DEXA   PONV (postoperative nausea and vomiting)    Rotator cuff arthropathy    Left   Seasonal allergies    Sleep apnea    wears CPAP nightly   Stroke (HCC) 1998   x 2 - mild left-sided weakness   Urge incontinence    Uterine prolapse    Past Surgical History:  Procedure Laterality Date   BACK SURGERY     x 2   BELPHAROPTOSIS REPAIR Bilateral 12/14/2017   BUBBLE STUDY  02/16/2021   Procedure: BUBBLE STUDY;  Surgeon: Vesta Mixer, MD;  Location: [facility] ENDOSCOPY;  Service: Cardiovascular;;   CARPAL TUNNEL RELEASE Bilateral    Dr August Saucer   CATARACT EXTRACTION     CATARACT EXTRACTION Bilateral 10/2017    COLONOSCOPY  2004, 2009, 2014   FINGER SURGERY     GUM SURGERY     GYNECOLOGIC CRYOSURGERY     HYSTEROSCOPY WITH D & C  12/07/2010   with resection of endometrial polyp   JOINT REPLACEMENT     KNEE ARTHROSCOPY Left 2008   lip biopsy     done at MD office Fri 11/22/13   MASS EXCISION Right 08/15/2013   Procedure: RIGHT INDEX EXCISION MASS ;  Surgeon: Tami Ribas, MD;  Location: [facility];  Service: Orthopedics;  Laterality: Right;   NASAL SEPTUM SURGERY     OOPHORECTOMY Right 2000   REVERSE SHOULDER ARTHROPLASTY Left 12/11/2018   REVERSE SHOULDER ARTHROPLASTY Left 12/11/2018   Procedure: LEFT REVERSE SHOULDER ARTHROPLASTY;  Surgeon: Cammy Copa, MD;  Location: [facility] OR;  Service: Orthopedics;  Laterality: Left;   SHOULDER ARTHROSCOPY WITH OPEN ROTATOR CUFF REPAIR AND DISTAL CLAVICLE ACROMINECTOMY Right 11/26/2013   Procedure: RIGHT SHOULDER ARTHROSCOPY WITH MINI OPEN ROTATOR CUFF REPAIR AND DISTAL CLAVICLE RESECTION, SUBACROMIAL DECOMPRESSION, POSSIBLE [facility] PATCH.;  Surgeon: Valeria Batman, MD;  Location: [facility] OR;  Service: Orthopedics;  Laterality: Right;   TEE  WITHOUT CARDIOVERSION N/A 02/16/2021   Procedure: TRANSESOPHAGEAL ECHOCARDIOGRAM (TEE);  Surgeon: Elease Hashimoto Deloris Ping, MD;  Location: [facility] ENDOSCOPY;  Service: Cardiovascular;  Laterality: N/A;   TOTAL KNEE ARTHROPLASTY Right 03/21/2017   TOTAL KNEE ARTHROPLASTY Right 03/21/2017   Procedure: RIGHT TOTAL KNEE ARTHROPLASTY;  Surgeon: Valeria Batman, MD;  Location: [facility] OR;  Service: Orthopedics;  Laterality: Right;   TOTAL KNEE ARTHROPLASTY Left 11/29/2022   Procedure: LEFT TOTAL KNEE ARTHROPLASTY;  Surgeon: Cammy Copa, MD;  Location: [facility] ORS;  Service: Orthopedics;  Laterality: Left;   TUBAL LIGATION     Patient Active Problem List   Diagnosis Date Noted   S/P total knee replacement, left 11/29/2022   Urinary incontinence 03/25/2022   ICH (intracerebral hemorrhage) (HCC) 02/13/2021   Iron  deficiency 10/07/2020   Aortic atherosclerosis (HCC) 03/17/2020   Lung nodule seen on imaging study 02/24/2020   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   Chronic left shoulder pain 08/23/2018   Arthritis of left knee 11/02/2016   Chronic diarrhea 12/07/2014   Segmental colitis with rectal bleeding (HCC) 09/12/2014   Routine general medical examination at a health care facility 06/05/2014   Overweight 06/13/2011   Hyperlipidemia 02/17/2007   Essential hypertension 01/08/2007   Osteoarthritis 01/08/2007    PCP: Myrlene Broker, MD  REFERRING PROVIDER: Cammy Copa, MD  REFERRING DIAG: S/P total knee replacement, left  THERAPY DIAG:  Total knee replacement status, left  Stiffness of left knee, not elsewhere classified  Gait difficulty  Muscle weakness (generalized)  Unsteadiness on feet  Rationale for Evaluation and Treatment: Rehabilitation  ONSET DATE: 11/29/2022  SUBJECTIVE:   SUBJECTIVE STATEMENT: Pt reports to PT s/p L TKA on 11/29/2022. She reports falling in hospital during her stay which also resulted in her hurting her R wrist; pt wearing a wrist brace upon arrival and using RW with armrest for R UE. Pt has had HHPT coming to visit 2-3 times a week since discharging from the hospital. Pt also reports mild numbness/tingling in the medial/lateral aspects of her L knee, as well as completely down her R leg from the knee down. Pt has hx of R TKA ~6 years ago.  PERTINENT HISTORY: Hx of R TKA (~6 years ago), reverse TSA on LUE, HTN PAIN:  Are you having pain? Yes: NPRS scale: 3/10 now/best, 8/10 at worst Pain location: L knee Aggravating factors: activity, exercise Relieving factors: Ice (polar care), tylenol, pt reports taking oxy at night PRN to help sleep  PRECAUTIONS: None  RED FLAGS: None   WEIGHT BEARING RESTRICTIONS: No  FALLS:  Has patient fallen in last 6 months? Yes. Number of falls 1  LIVING ENVIRONMENT: Lives with: lives with  their spouse Lives in: House/apartment Stairs: Yes: Internal: 1 flight of steps (doesn't need to use); External: 2 steps, rail on R going up Has following equipment at home: Single point cane and Walker - 4 wheeled  PLOF: Independent  PATIENT GOALS: Get back to being able to take care of/walk dogs, get back to walking without AD  NEXT MD VISIT: TBD  OBJECTIVE:   DIAGNOSTIC FINDINGS:  XR Wrist Complete Right:  PA, oblique, lateral, navicular views of right wrist reviewed.  No  fracture or dislocation noted.  Severe degenerative changes noted of the first [facility] joint with mild degenerative changes of the radiocarpal joint and STT joint.  Mild to moderate degenerative changes of the first MCP and IP joints.   PATIENT SURVEYS:  FOTO 43/54  COGNITION: Overall cognitive  status: Within functional limits for tasks assessed     SENSATION: Not formally tested however pt reports numbness down R LE ever since R TKA ~6 years ago  EDEMA:  Circumferential measurements:  - Right: Joint line = 45 cm, 2 inches superior to patella = 47 cm, 2 inches distal to patella = 40.5 cm  - Left: Joint line = 44 cm, 2 inches superior to patella = 49.5 cm, 2 inches distal to patella = 42 cm  POSTURE: flexed trunk   PALPATION: L knee incision site examined, no excess redness or warmth noted; mild swelling still present.   LOWER EXTREMITY ROM/:  R knee AROM: 0-102 degrees (in supine)  L knee AROM: 0-89 degrees (in supine); pt able to achieve 97 degrees active knee flexion in sitting   JOINT MOBILITY ASSESSMENTS:  L Tibiofemoral Joint:   - A/P and P/A glides hypomobile with tight end feel   - Medial/Lateral glides WNL   - IR hypomobile with soft end feel   - ER hypomobile with soft end feel, painful   -Patellar glides me/lat/sup/inf     LOWER EXTREMITY MMT:  MMT Right eval Left eval  Hip flexion 3+ (in supine) 3+ (in supine)  Hip extension 3 3-  Hip abduction 3 3  Hip adduction 3 3  Hip  internal rotation    Hip external rotation    Knee flexion 3+   Knee extension    Ankle dorsiflexion    Ankle plantarflexion    Ankle inversion    Ankle eversion    Knee strength testing deferred on L side secondary to pain  FUNCTIONAL TESTS:  30 seconds chair stand test: 7 reps Timed up and go (TUG): 20 seconds, no AD  BALANCE TESTS: SLS: 5-6 seconds bilaterally, increased instability with L side  10+ seconds tandem stance bilaterally   GAIT: Distance walked: 20 feet during TUG Assistive device utilized: None Level of assistance: Complete Independence Comments: Slow, cautious gait pattern and speed. Slow turns.    TODAY'S TREATMENT:                                                                                                                              DATE: 12/22/2022   Subjective: Pt reports to PT with 6-7/10 pain  in her L knee, she says she think it is related to the weather. She says the exercise she was doing over the weekend went ok, it made her a little sore but no significant increase in pain.   Therex: 35 Nustep x 7 mins, lvl 2 to 3 Sitting L knee flexion passive stretch holds: x10 reps - increased pain with end-range knee flexion FAQ #3 3 x2- 10 reps Supine TKE #3 3x 10 reps Supine SLR #2 3 x 10reps Bridges 1 x 10 reps after each LLE ex Hip Abd 3 x 10 reps Prone Hamstring curls #3 3 x 10 reps STS with R-band 60 secs  Manual  Therapy:10 Supine L A/P + IR tibiofemoral glides: grades 2-3 x 3 minutes Supine L superior/inferior patellofemoral glides: grades 2-3 x 3 minutes Tibial distraction     PATIENT EDUCATION:  Education details: Pt educated on HEP completion and frequency, educated to avoid using lotion at incision site until scarring/stitches have healed to avoid infection Person educated: Patient Education method: Medical illustrator Education comprehension: verbalized understanding  HOME EXERCISE PROGRAM: Access Code: [email or web address] URL:  [email or web address] Date: 12/29/2022 Prepared by: Janet Berlin  Exercises - Supine Bridge with Resistance Band  - 1 x daily - 7 x weekly - 3 sets - 10 reps - Seated Long Arc Quad with Ankle Weight  - 1 x daily - 7 x weekly - 3 sets - 10 reps - Supine Hip Flexion with Ankle Weight  - 1 x daily - 7 x weekly - 3 sets - 10 reps - Prone Hip Extension with Ankle Weight  - 1 x daily - 7 x weekly - 3 sets - 10 reps - Sit to Stand with Resistance Around Legs  - 1 x daily - 7 x weekly - 3 sets - 10 reps  ASSESSMENT:  CLINICAL IMPRESSION: Pt reports of 4/10 dull ache pain. No swelling, redness or warmth noted. Post Jt PROM and PROM AROM 102 and PROM 107 with mild discomfort. So new ROM 0-107. Patellar glides improved with soft end feel.  Pt challenged with Strengthening and ROM exs. Pt provided with HEP in H/O with R-T band for progression. Pt demonstrates good understanding. Pt tol tx well and showing steady progress. Pt will benefit from continued PT services upon discharge to safely address deficits listed in patient problem list for decreased caregiver assistance and eventual return to PLOF.   OBJECTIVE IMPAIRMENTS: decreased activity tolerance, decreased balance, decreased endurance, difficulty walking, decreased ROM, decreased strength, and pain.   ACTIVITY LIMITATIONS: squatting, stairs, transfers, and locomotion level  PARTICIPATION LIMITATIONS: cleaning, laundry, community activity, and yard work  PERSONAL FACTORS: Age and 3+ comorbidities: Hx of R TKA, hx of reverse TSA on L UE, R wrist pain  are also affecting patient's functional outcome.   REHAB POTENTIAL: Good  CLINICAL DECISION MAKING: Stable/uncomplicated  EVALUATION COMPLEXITY: Low   GOALS:  SHORT TERM GOALS: Target date: 01/19/2023 Pt will be independent and compliant with HEP in order to increase L knee flexion AROM to at least 110 degrees needed for improved functional mobility Baseline: 97 degrees AROM in  sitting position Goal status: INITIAL  2.  Pt will report subjective improvement in L knee pain with exercise on NPS scale by at least 3 points in order to show clinically significant improvement in pain levels. Baseline: 8/10 Goal status: INITIAL    LONG TERM GOALS: Target date: 02/16/2023  Pt will improve FOTO to at least 54 in order to show improved pain and function with ADL's and functional mobility. Baseline: Initial 43 Goal status: INITIAL  2.  Pt will be able to improve rep count during 30 second sit-to-stand test to at least 14 in order to demonstrate improved functional independence. Baseline: 7 reps  Goal status: INITIAL  3.  Pt will decrease TUG time to at least 11 seconds in order to be below cutoff for fall-risk. Baseline: 20 seconds, no AD Goal status: INITIAL  4.  Pt will demonstrate at least 4/5 MMT scores for hip strength bilaterally in order to show improvement in LE strength needed for improved mobility and independence. Baseline: See above for initial MMT scores Goal status:  INITIAL  5.  Pt will improve distance by at least 50 meters with LRAD in order to show improved cardiovascular and LE endurance. Baseline: 776 feet (12/27/2022) Goal status: INITIAL    PLAN:  PT FREQUENCY: 2x/week  PT DURATION: 8 weeks  PLANNED INTERVENTIONS: Therapeutic exercises, Therapeutic activity, Neuromuscular re-education, Balance training, Gait training, Patient/Family education, Joint mobilization, Stair training, Cryotherapy, Moist heat, and Manual therapy  PLAN FOR NEXT SESSION: Follow up on HEP, progress LE strengthening exercises, progress L knee flexion AROM/PROM as pain tolerates (TAKE MEASUREMENT for knee flexion)   Janet Berlin PT DPT 2:47 PM,12/29/22

## 2023-01-03 ENCOUNTER — Encounter: Payer: Self-pay | Admitting: Physical Therapy

## 2023-01-03 ENCOUNTER — Ambulatory Visit: Payer: Medicare Other | Admitting: Physical Therapy

## 2023-01-03 DIAGNOSIS — Z96652 Presence of left artificial knee joint: Secondary | ICD-10-CM | POA: Diagnosis not present

## 2023-01-03 DIAGNOSIS — M25662 Stiffness of left knee, not elsewhere classified: Secondary | ICD-10-CM

## 2023-01-03 DIAGNOSIS — R2681 Unsteadiness on feet: Secondary | ICD-10-CM

## 2023-01-03 DIAGNOSIS — R269 Unspecified abnormalities of gait and mobility: Secondary | ICD-10-CM | POA: Diagnosis not present

## 2023-01-03 DIAGNOSIS — M6281 Muscle weakness (generalized): Secondary | ICD-10-CM | POA: Diagnosis not present

## 2023-01-03 DIAGNOSIS — M25562 Pain in left knee: Secondary | ICD-10-CM | POA: Diagnosis not present

## 2023-01-03 NOTE — Therapy (Unsigned)
OUTPATIENT PHYSICAL THERAPY LOWER EXTREMITY TREATMENT   Patient Name: [name] MRN: [id number] DOB:[birth year], 72 y.o., female Today's Date: 01/03/2023  END OF SESSION:  PT End of Session - 01/03/23 1645     Visit Number 4    Number of Visits 16    Date for PT Re-Evaluation 02/16/23    PT Start Time 1647    PT Stop Time 1730    PT Time Calculation (min) 43 min    Activity Tolerance Patient limited by pain    Behavior During Therapy [facility] for tasks assessed/performed             Past Medical History:  Diagnosis Date   Arthritis    back, fingers with joint pain and swelling.  chronic back pain   Cataract    Chronic back pain    arthritis    Diverticulosis    Elevated cholesterol    takes Niacin daily and Simvastatin   GERD (gastroesophageal reflux disease) 06/2004   non-specific gastritis on EGD 06/2004   Headache(784.0)    occasionally   History of colon polyps 2004, 2009   2004:adenomatous. 2009 hyperplastic.    History of hiatal hernia    Hypertension    takes Tenoretic and Lisinopril daily   Mucoid cyst of joint 08/2013   right index finger   Neuromuscular disorder (HCC)    numbness in right leg after knee replacement   Osteopenia 01/2014   T score -1.1 FRAX 14%/0.5%. Stable from prior DEXA   PONV (postoperative nausea and vomiting)    Rotator cuff arthropathy    Left   Seasonal allergies    Sleep apnea    wears CPAP nightly   Stroke (HCC) 1998   x 2 - mild left-sided weakness   Urge incontinence    Uterine prolapse    Past Surgical History:  Procedure Laterality Date   BACK SURGERY     x 2   BELPHAROPTOSIS REPAIR Bilateral 12/14/2017   BUBBLE STUDY  02/16/2021   Procedure: BUBBLE STUDY;  Surgeon: Vesta Mixer, MD;  Location: [facility];  Service: Cardiovascular;;   CARPAL TUNNEL RELEASE Bilateral    Dr August Saucer   CATARACT EXTRACTION     CATARACT EXTRACTION Bilateral 10/2017   COLONOSCOPY  2004, 2009, 2014   FINGER SURGERY     GUM  SURGERY     GYNECOLOGIC CRYOSURGERY     HYSTEROSCOPY WITH D & C  12/07/2010   with resection of endometrial polyp   JOINT REPLACEMENT     KNEE ARTHROSCOPY Left 2008   lip biopsy     done at MD office Fri 11/22/13   MASS EXCISION Right 08/15/2013   Procedure: RIGHT INDEX EXCISION MASS ;  Surgeon: Tami Ribas, MD;  Location: [facility];  Service: Orthopedics;  Laterality: Right;   NASAL SEPTUM SURGERY     OOPHORECTOMY Right 2000   REVERSE SHOULDER ARTHROPLASTY Left 12/11/2018   REVERSE SHOULDER ARTHROPLASTY Left 12/11/2018   Procedure: LEFT REVERSE SHOULDER ARTHROPLASTY;  Surgeon: Cammy Copa, MD;  Location: [facility];  Service: Orthopedics;  Laterality: Left;   SHOULDER ARTHROSCOPY WITH OPEN ROTATOR CUFF REPAIR AND DISTAL CLAVICLE ACROMINECTOMY Right 11/26/2013   Procedure: RIGHT SHOULDER ARTHROSCOPY WITH MINI OPEN ROTATOR CUFF REPAIR AND DISTAL CLAVICLE RESECTION, SUBACROMIAL DECOMPRESSION, POSSIBLE [facility] PATCH.;  Surgeon: Valeria Batman, MD;  Location: [facility] OR;  Service: Orthopedics;  Laterality: Right;   TEE WITHOUT CARDIOVERSION N/A 02/16/2021   Procedure: TRANSESOPHAGEAL ECHOCARDIOGRAM (TEE);  Surgeon: Elease Hashimoto Deloris Ping, MD;  Location: [facility];  Service: Cardiovascular;  Laterality: N/A;   TOTAL KNEE ARTHROPLASTY Right 03/21/2017   TOTAL KNEE ARTHROPLASTY Right 03/21/2017   Procedure: RIGHT TOTAL KNEE ARTHROPLASTY;  Surgeon: Valeria Batman, MD;  Location: [facility] OR;  Service: Orthopedics;  Laterality: Right;   TOTAL KNEE ARTHROPLASTY Left 11/29/2022   Procedure: LEFT TOTAL KNEE ARTHROPLASTY;  Surgeon: Cammy Copa, MD;  Location: [facility];  Service: Orthopedics;  Laterality: Left;   TUBAL LIGATION     Patient Active Problem List   Diagnosis Date Noted   S/P total knee replacement, left 11/29/2022   Urinary incontinence 03/25/2022   ICH (intracerebral hemorrhage) (HCC) 02/13/2021   Iron deficiency 10/07/2020   Aortic atherosclerosis (HCC) 03/17/2020    Lung nodule seen on imaging study 02/24/2020   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   Chronic left shoulder pain 08/23/2018   Arthritis of left knee 11/02/2016   Chronic diarrhea 12/07/2014   Segmental colitis with rectal bleeding (HCC) 09/12/2014   Routine general medical examination at a health care facility 06/05/2014   Overweight 06/13/2011   Hyperlipidemia 02/17/2007   Essential hypertension 01/08/2007   Osteoarthritis 01/08/2007    PCP: Myrlene Broker, MD  REFERRING PROVIDER: Cammy Copa, MD  REFERRING DIAG: S/P total knee replacement, left  THERAPY DIAG:  Total knee replacement status, left  Stiffness of left knee, not elsewhere classified  Gait difficulty  Muscle weakness (generalized)  Unsteadiness on feet  Rationale for Evaluation and Treatment: Rehabilitation  ONSET DATE: 11/29/2022  SUBJECTIVE:   SUBJECTIVE STATEMENT: Pt reports to PT s/p L TKA on 11/29/2022. She reports falling in hospital during her stay which also resulted in her hurting her R wrist; pt wearing a wrist brace upon arrival and using RW with armrest for R UE. Pt has had HHPT coming to visit 2-3 times a week since discharging from the hospital. Pt also reports mild numbness/tingling in the medial/lateral aspects of her L knee, as well as completely down her R leg from the knee down. Pt has hx of R TKA ~6 years ago.  PERTINENT HISTORY: Hx of R TKA (~6 years ago), reverse TSA on LUE, HTN PAIN:  Are you having pain? Yes: NPRS scale: 3/10 now/best, 8/10 at worst Pain location: L knee Aggravating factors: activity, exercise Relieving factors: Ice (polar care), tylenol, pt reports taking oxy at night PRN to help sleep  PRECAUTIONS: None  RED FLAGS: None   WEIGHT BEARING RESTRICTIONS: No  FALLS:  Has patient fallen in last 6 months? Yes. Number of falls 1  LIVING ENVIRONMENT: Lives with: lives with their spouse Lives in: House/apartment Stairs: Yes: Internal: 1  flight of steps (doesn't need to use); External: 2 steps, rail on R going up Has following equipment at home: Single point cane and Walker - 4 wheeled  PLOF: Independent  PATIENT GOALS: Get back to being able to take care of/walk dogs, get back to walking without AD  NEXT MD VISIT: TBD  OBJECTIVE:   DIAGNOSTIC FINDINGS:  XR Wrist Complete Right:  PA, oblique, lateral, navicular views of right wrist reviewed.  No  fracture or dislocation noted.  Severe degenerative changes noted of the first [facility] joint with mild degenerative changes of the radiocarpal joint and STT joint.  Mild to moderate degenerative changes of the first MCP and IP joints.   PATIENT SURVEYS:  FOTO 43/54  COGNITION: Overall cognitive status: Within functional limits for tasks assessed  SENSATION: Not formally tested however pt reports numbness down R LE ever since R TKA ~6 years ago  EDEMA:  Circumferential measurements:  - Right: Joint line = 45 cm, 2 inches superior to patella = 47 cm, 2 inches distal to patella = 40.5 cm  - Left: Joint line = 44 cm, 2 inches superior to patella = 49.5 cm, 2 inches distal to patella = 42 cm  POSTURE: flexed trunk   PALPATION: L knee incision site examined, no excess redness or warmth noted; mild swelling still present.   LOWER EXTREMITY ROM/:  R knee AROM: 0-102 degrees (in supine)  L knee AROM: 0-89 degrees (in supine); pt able to achieve 97 degrees active knee flexion in sitting   JOINT MOBILITY ASSESSMENTS:  L Tibiofemoral Joint:   - A/P and P/A glides hypomobile with tight end feel   - Medial/Lateral glides WNL   - IR hypomobile with soft end feel   - ER hypomobile with soft end feel, painful   -Patellar glides me/lat/sup/inf     LOWER EXTREMITY MMT:  MMT Right eval Left eval  Hip flexion 3+ (in supine) 3+ (in supine)  Hip extension 3 3-  Hip abduction 3 3  Hip adduction 3 3  Hip internal rotation    Hip external rotation    Knee flexion 3+    Knee extension    Ankle dorsiflexion    Ankle plantarflexion    Ankle inversion    Ankle eversion    Knee strength testing deferred on L side secondary to pain  FUNCTIONAL TESTS:  30 seconds chair stand test: 7 reps Timed up and go (TUG): 20 seconds, no AD  BALANCE TESTS: SLS: 5-6 seconds bilaterally, increased instability with L side  10+ seconds tandem stance bilaterally   GAIT: Distance walked: 20 feet during TUG Assistive device utilized: None Level of assistance: Complete Independence Comments: Slow, cautious gait pattern and speed. Slow turns.    TODAY'S TREATMENT:                                                                                                                              DATE: 01/03/2023   Subjective: Pt reports to PT with 3/10 pain in her L knee today. She says she may have pulled her L hip flexor while doing her sit-to-stand exercise over the weekend; it is still bothering her a little bit. Pt reports returning to driving for the first time yesterday.  Therex:  Nustep L3 (seat position 7-4) x 8 mins.   Standing knee flexion stretch on 2nd step, BUE support, 15 reps of 2-3 second holds into L knee flexion   Sitting L knee flexion passive stretch holds with MET into extension: x10 reps - increased pain with end-range knee flexion - 110 degrees L knee flexion AROM at end of set   High knees in // bars with 3# AW: 4 laps  Sidesteps in // bars with 3# AW:  4 laps  Standing 3-way hip with 3# AW: 1 set of 8 each direction  LAQ with 2# AW: 2x15 on L side  Supine SLR with 2# AW: 2x10 on L side   Manual Therapy:  No charge Supine L A/P  tibiofemoral glides: grades 2-3 x 3 minutes   PATIENT EDUCATION:  Education details: Pt educated on HEP completion and frequency, educated to avoid using lotion at incision site until scarring/stitches have healed to avoid infection Person educated: Patient Education method: Medical illustrator Education  comprehension: verbalized understanding  HOME EXERCISE PROGRAM: Access Code: [email or web address] URL: [email or web address] Date: 12/29/2022 Prepared by: Janet Berlin  Exercises - Supine Bridge with Resistance Band  - 1 x daily - 7 x weekly - 3 sets - 10 reps - Seated Long Arc Quad with Ankle Weight  - 1 x daily - 7 x weekly - 3 sets - 10 reps - Supine Hip Flexion with Ankle Weight  - 1 x daily - 7 x weekly - 3 sets - 10 reps - Prone Hip Extension with Ankle Weight  - 1 x daily - 7 x weekly - 3 sets - 10 reps - Sit to Stand with Resistance Around Legs  - 1 x daily - 7 x weekly - 3 sets - 10 reps  ASSESSMENT:  CLINICAL IMPRESSION: Pt. reports to PT with usual L knee pain/soreness. Today's session consisted of manual stretching and METs to improve pt's L knee flexion ROM; she was able to achieve 111 degrees actively in sitting position today. The rest of the session consisted of exercises aimed to improve pt's hip and quad/hamstring strength. Pt. tolerates all exercises well but continues to complain of increased L knee pain with end-range knee flexion. Pt will benefit from continued PT services upon discharge to safely address deficits listed in patient problem list for decreased caregiver assistance and eventual return to PLOF.   OBJECTIVE IMPAIRMENTS: decreased activity tolerance, decreased balance, decreased endurance, difficulty walking, decreased ROM, decreased strength, and pain.   ACTIVITY LIMITATIONS: squatting, stairs, transfers, and locomotion level  PARTICIPATION LIMITATIONS: cleaning, laundry, community activity, and yard work  PERSONAL FACTORS: Age and 3+ comorbidities: Hx of R TKA, hx of reverse TSA on L UE, R wrist pain  are also affecting patient's functional outcome.   REHAB POTENTIAL: Good  CLINICAL DECISION MAKING: Stable/uncomplicated  EVALUATION COMPLEXITY: Low   GOALS:  SHORT TERM GOALS: Target date: 01/19/2023 Pt will be independent and compliant with HEP  in order to increase L knee flexion AROM to at least 110 degrees needed for improved functional mobility Baseline: 97 degrees AROM in sitting position Goal status: INITIAL  2.  Pt will report subjective improvement in L knee pain with exercise on NPS scale by at least 3 points in order to show clinically significant improvement in pain levels. Baseline: 8/10 Goal status: INITIAL   LONG TERM GOALS: Target date: 02/16/2023  Pt will improve FOTO to at least 54 in order to show improved pain and function with ADL's and functional mobility. Baseline: Initial 43 Goal status: INITIAL  2.  Pt will be able to improve rep count during 30 second sit-to-stand test to at least 14 in order to demonstrate improved functional independence. Baseline: 7 reps  Goal status: INITIAL  3.  Pt will decrease TUG time to at least 11 seconds in order to be below cutoff for fall-risk. Baseline: 20 seconds, no AD Goal status: INITIAL  4.  Pt will demonstrate at least 4/5 MMT scores for  hip strength bilaterally in order to show improvement in LE strength needed for improved mobility and independence. Baseline: See above for initial MMT scores Goal status: INITIAL  5.  Pt will improve distance by at least 50 meters with LRAD in order to show improved cardiovascular and LE endurance. Baseline: 776 feet (12/27/2022) Goal status: INITIAL    PLAN:  PT FREQUENCY: 2x/week  PT DURATION: 8 weeks  PLANNED INTERVENTIONS: Therapeutic exercises, Therapeutic activity, Neuromuscular re-education, Balance training, Gait training, Patient/Family education, Joint mobilization, Stair training, Cryotherapy, Moist heat, and Manual therapy  PLAN FOR NEXT SESSION: Modify/progress HEP as tolerated, progress LE strengthening exercises, progress L knee flexion AROM/PROM as pain tolerates.   Cammie Mcgee, PT, DPT # [phone] Cena Benton, SPT 01/03/23, 5:48 PM

## 2023-01-05 ENCOUNTER — Encounter: Payer: Self-pay | Admitting: Physical Therapy

## 2023-01-05 ENCOUNTER — Ambulatory Visit: Payer: Medicare Other | Admitting: Physical Therapy

## 2023-01-05 DIAGNOSIS — Z96652 Presence of left artificial knee joint: Secondary | ICD-10-CM

## 2023-01-05 DIAGNOSIS — M6281 Muscle weakness (generalized): Secondary | ICD-10-CM | POA: Diagnosis not present

## 2023-01-05 DIAGNOSIS — R269 Unspecified abnormalities of gait and mobility: Secondary | ICD-10-CM

## 2023-01-05 DIAGNOSIS — M25562 Pain in left knee: Secondary | ICD-10-CM | POA: Diagnosis not present

## 2023-01-05 DIAGNOSIS — M25662 Stiffness of left knee, not elsewhere classified: Secondary | ICD-10-CM | POA: Diagnosis not present

## 2023-01-05 DIAGNOSIS — R2681 Unsteadiness on feet: Secondary | ICD-10-CM | POA: Diagnosis not present

## 2023-01-05 NOTE — Therapy (Signed)
OUTPATIENT PHYSICAL THERAPY LOWER EXTREMITY TREATMENT   Patient Name: [name] MRN: [id number] DOB:November 25, 1950, 72 y.o., female Today's Date: 01/05/2023  END OF SESSION:  PT End of Session - 01/05/23 1642     Number of Visits 5 of 16    Date for PT Re-Evaluation 02/16/23    PT Start Time 1642 to 1732  (50 minutes).   Activity Tolerance Patient limited by pain    Behavior During Therapy [facility] for tasks assessed/performed         Past Medical History:  Diagnosis Date   Arthritis    back, fingers with joint pain and swelling.  chronic back pain   Cataract    Chronic back pain    arthritis    Diverticulosis    Elevated cholesterol    takes Niacin daily and Simvastatin   GERD (gastroesophageal reflux disease) 06/2004   non-specific gastritis on EGD 06/2004   Headache(784.0)    occasionally   History of colon polyps 2004, 2009   2004:adenomatous. 2009 hyperplastic.    History of hiatal hernia    Hypertension    takes Tenoretic and Lisinopril daily   Mucoid cyst of joint 08/2013   right index finger   Neuromuscular disorder (HCC)    numbness in right leg after knee replacement   Osteopenia 01/2014   T score -1.1 FRAX 14%/0.5%. Stable from prior DEXA   PONV (postoperative nausea and vomiting)    Rotator cuff arthropathy    Left   Seasonal allergies    Sleep apnea    wears CPAP nightly   Stroke (HCC) 1998   x 2 - mild left-sided weakness   Urge incontinence    Uterine prolapse    Past Surgical History:  Procedure Laterality Date   BACK SURGERY     x 2   BELPHAROPTOSIS REPAIR Bilateral 12/14/2017   BUBBLE STUDY  02/16/2021   Procedure: BUBBLE STUDY;  Surgeon: Vesta Mixer, MD;  Location: [facility] ENDOSCOPY;  Service: Cardiovascular;;   CARPAL TUNNEL RELEASE Bilateral    Dr August Saucer   CATARACT EXTRACTION     CATARACT EXTRACTION Bilateral 10/2017   COLONOSCOPY  2004, 2009, 2014   FINGER SURGERY     GUM SURGERY     GYNECOLOGIC CRYOSURGERY     HYSTEROSCOPY WITH D  & C  12/07/2010   with resection of endometrial polyp   JOINT REPLACEMENT     KNEE ARTHROSCOPY Left 2008   lip biopsy     done at MD office Fri 11/22/13   MASS EXCISION Right 08/15/2013   Procedure: RIGHT INDEX EXCISION MASS ;  Surgeon: Tami Ribas, MD;  Location: [facility];  Service: Orthopedics;  Laterality: Right;   NASAL SEPTUM SURGERY     OOPHORECTOMY Right 2000   REVERSE SHOULDER ARTHROPLASTY Left 12/11/2018   REVERSE SHOULDER ARTHROPLASTY Left 12/11/2018   Procedure: LEFT REVERSE SHOULDER ARTHROPLASTY;  Surgeon: Cammy Copa, MD;  Location: [facility] OR;  Service: Orthopedics;  Laterality: Left;   SHOULDER ARTHROSCOPY WITH OPEN ROTATOR CUFF REPAIR AND DISTAL CLAVICLE ACROMINECTOMY Right 11/26/2013   Procedure: RIGHT SHOULDER ARTHROSCOPY WITH MINI OPEN ROTATOR CUFF REPAIR AND DISTAL CLAVICLE RESECTION, SUBACROMIAL DECOMPRESSION, POSSIBLE [facility] PATCH.;  Surgeon: Valeria Batman, MD;  Location: [facility] OR;  Service: Orthopedics;  Laterality: Right;   TEE WITHOUT CARDIOVERSION N/A 02/16/2021   Procedure: TRANSESOPHAGEAL ECHOCARDIOGRAM (TEE);  Surgeon: Elease Hashimoto Deloris Ping, MD;  Location: [facility] ENDOSCOPY;  Service: Cardiovascular;  Laterality: N/A;   TOTAL KNEE ARTHROPLASTY  Right 03/21/2017   TOTAL KNEE ARTHROPLASTY Right 03/21/2017   Procedure: RIGHT TOTAL KNEE ARTHROPLASTY;  Surgeon: Valeria Batman, MD;  Location: [facility] OR;  Service: Orthopedics;  Laterality: Right;   TOTAL KNEE ARTHROPLASTY Left 11/29/2022   Procedure: LEFT TOTAL KNEE ARTHROPLASTY;  Surgeon: Cammy Copa, MD;  Location: [facility] ORS;  Service: Orthopedics;  Laterality: Left;   TUBAL LIGATION     Patient Active Problem List   Diagnosis Date Noted   S/P total knee replacement, left 11/29/2022   Urinary incontinence 03/25/2022   ICH (intracerebral hemorrhage) (HCC) 02/13/2021   Iron deficiency 10/07/2020   Aortic atherosclerosis (HCC) 03/17/2020   Lung nodule seen on imaging study 02/24/2020   OA  (osteoarthritis) of shoulder 12/11/2018   Chronic left shoulder pain 08/23/2018   Arthritis of left knee 11/02/2016   Chronic diarrhea 12/07/2014   Segmental colitis with rectal bleeding (HCC) 09/12/2014   Routine general medical examination at a health care facility 06/05/2014   Overweight 06/13/2011   Hyperlipidemia 02/17/2007   Essential hypertension 01/08/2007   Osteoarthritis 01/08/2007    PCP: Myrlene Broker, MD  REFERRING PROVIDER: Cammy Copa, MD  REFERRING DIAG: S/P total knee replacement, left  THERAPY DIAG:  Total knee replacement status, left  Stiffness of left knee, not elsewhere classified  Gait difficulty  Muscle weakness (generalized)  Rationale for Evaluation and Treatment: Rehabilitation  ONSET DATE: 11/29/2022  SUBJECTIVE:   SUBJECTIVE STATEMENT: Pt reports to PT s/p L TKA on 11/29/2022. She reports falling in hospital during her stay which also resulted in her hurting her R wrist; pt wearing a wrist brace upon arrival and using RW with armrest for R UE. Pt has had HHPT coming to visit 2-3 times a week since discharging from the hospital. Pt also reports mild numbness/tingling in the medial/lateral aspects of her L knee, as well as completely down her R leg from the knee down. Pt has hx of R TKA ~6 years ago.  PERTINENT HISTORY: Hx of R TKA (~6 years ago), reverse TSA on LUE, HTN PAIN:  Are you having pain? Yes: NPRS scale: 3/10 now/best, 8/10 at worst Pain location: L knee Aggravating factors: activity, exercise Relieving factors: Ice (polar care), tylenol, pt reports taking oxy at night PRN to help sleep  PRECAUTIONS: None  RED FLAGS: None   WEIGHT BEARING RESTRICTIONS: No  FALLS:  Has patient fallen in last 6 months? Yes. Number of falls 1  LIVING ENVIRONMENT: Lives with: lives with their spouse Lives in: House/apartment Stairs: Yes: Internal: 1 flight of steps (doesn't need to use); External: 2 steps, rail on R going  up Has following equipment at home: Single point cane and Walker - 4 wheeled  PLOF: Independent  PATIENT GOALS: Get back to being able to take care of/walk dogs, get back to walking without AD  NEXT MD VISIT: TBD  OBJECTIVE:   DIAGNOSTIC FINDINGS:  XR Wrist Complete Right:  PA, oblique, lateral, navicular views of right wrist reviewed.  No  fracture or dislocation noted.  Severe degenerative changes noted of the first [facility] joint with mild degenerative changes of the radiocarpal joint and STT joint.  Mild to moderate degenerative changes of the first MCP and IP joints.   PATIENT SURVEYS:  FOTO 43/54  COGNITION: Overall cognitive status: Within functional limits for tasks assessed     SENSATION: Not formally tested however pt reports numbness down R LE ever since R TKA ~6 years ago  EDEMA:  Circumferential measurements:  -  Right: Joint line = 45 cm, 2 inches superior to patella = 47 cm, 2 inches distal to patella = 40.5 cm  - Left: Joint line = 44 cm, 2 inches superior to patella = 49.5 cm, 2 inches distal to patella = 42 cm  POSTURE: flexed trunk   PALPATION: L knee incision site examined, no excess redness or warmth noted; mild swelling still present.   LOWER EXTREMITY ROM/:  R knee AROM: 0-102 degrees (in supine)  L knee AROM: 0-89 degrees (in supine); pt able to achieve 97 degrees active knee flexion in sitting   JOINT MOBILITY ASSESSMENTS:  L Tibiofemoral Joint:   - A/P and P/A glides hypomobile with tight end feel   - Medial/Lateral glides WNL   - IR hypomobile with soft end feel   - ER hypomobile with soft end feel, painful   -Patellar glides me/lat/sup/inf     LOWER EXTREMITY MMT:  MMT Right eval Left eval  Hip flexion 3+ (in supine) 3+ (in supine)  Hip extension 3 3-  Hip abduction 3 3  Hip adduction 3 3  Hip internal rotation    Hip external rotation    Knee flexion 3+   Knee extension    Ankle dorsiflexion    Ankle plantarflexion    Ankle  inversion    Ankle eversion    Knee strength testing deferred on L side secondary to pain  FUNCTIONAL TESTS:  30 seconds chair stand test: 7 reps Timed up and go (TUG): 20 seconds, no AD  BALANCE TESTS: SLS: 5-6 seconds bilaterally, increased instability with L side  10+ seconds tandem stance bilaterally   GAIT: Distance walked: 20 feet during TUG Assistive device utilized: None Level of assistance: Complete Independence Comments: Slow, cautious gait pattern and speed. Slow turns.    TODAY'S TREATMENT:                                                                                                                              DATE: 01/05/2023   Subjective: Pt reports to PT with 5/10 pain in her L knee/ quad today. Pt. Using rollator while walking into PT clinic and with outside walking.  Pt. Has SPC at home.    Therex:  Nustep L3 (seat position 6-4) x 10 mins.   Standing knee flexion stretch on 2nd step, BUE support, 10 reps of 5-10 second holds into L knee flexion   Sitting L knee flexion passive stretch holds with MET into extension: x10 reps - increased pain with end-range knee flexion - 110 degrees L knee flexion AROM at end of set   Walking in //-bars: forward/ backwards/ lateral with cuing for increase hip/knee flexion and consistent recip. Step pattern.    Supine marching/ SLR 10x each.    Reviewed HEP  Manual Therapy:    Supine L A/P  tibiofemoral glides: grades 2-3 x 3 minutes  Supine STM to L distal quad/ hamstring.  Instructed in  scar massage with use of Vitamin E lotion.  Moderate crepitus under scar with cross-friction massage.  Improved scar appearance after massage.   Supine L knee flexion 110 deg. (AROM)- pain tolerable.  L knee 113 deg. With marked increase in pain.     PATIENT EDUCATION:  Education details: Pt educated on HEP completion and frequency, educated to avoid using lotion at incision site until scarring/stitches have healed to avoid  infection Person educated: Patient Education method: Medical illustrator Education comprehension: verbalized understanding  HOME EXERCISE PROGRAM: Access Code: [email or web address] URL: [email or web address] Date: 12/29/2022 Prepared by: Janet Berlin  Exercises - Supine Bridge with Resistance Band  - 1 x daily - 7 x weekly - 3 sets - 10 reps - Seated Long Arc Quad with Ankle Weight  - 1 x daily - 7 x weekly - 3 sets - 10 reps - Supine Hip Flexion with Ankle Weight  - 1 x daily - 7 x weekly - 3 sets - 10 reps - Prone Hip Extension with Ankle Weight  - 1 x daily - 7 x weekly - 3 sets - 10 reps - Sit to Stand with Resistance Around Legs  - 1 x daily - 7 x weekly - 3 sets - 10 reps  ASSESSMENT:  CLINICAL IMPRESSION: Pt. reports to PT with usual L knee pain/soreness. Today's session consisted of manual stretching and METs to improve pt's L knee flexion ROM; she was able to achieve 113 degrees actively in supine position today but pain limited. The rest of the session consisted of exercises aimed to improve pt's hip and quad/hamstring strength. Pt. tolerates all exercises well but continues to complain of increased L knee pain with end-range knee flexion. Pt will benefit from continued PT services upon discharge to safely address deficits listed in patient problem list for decreased caregiver assistance and eventual return to PLOF.   OBJECTIVE IMPAIRMENTS: decreased activity tolerance, decreased balance, decreased endurance, difficulty walking, decreased ROM, decreased strength, and pain.   ACTIVITY LIMITATIONS: squatting, stairs, transfers, and locomotion level  PARTICIPATION LIMITATIONS: cleaning, laundry, community activity, and yard work  PERSONAL FACTORS: Age and 3+ comorbidities: Hx of R TKA, hx of reverse TSA on L UE, R wrist pain  are also affecting patient's functional outcome.   REHAB POTENTIAL: Good  CLINICAL DECISION MAKING: Stable/uncomplicated  EVALUATION  COMPLEXITY: Low   GOALS:  SHORT TERM GOALS: Target date: 01/19/2023 Pt will be independent and compliant with HEP in order to increase L knee flexion AROM to at least 110 degrees needed for improved functional mobility Baseline: 97 degrees AROM in sitting position Goal status: INITIAL  2.  Pt will report subjective improvement in L knee pain with exercise on NPS scale by at least 3 points in order to show clinically significant improvement in pain levels. Baseline: 8/10 Goal status: INITIAL   LONG TERM GOALS: Target date: 02/16/2023  Pt will improve FOTO to at least 54 in order to show improved pain and function with ADL's and functional mobility. Baseline: Initial 43 Goal status: INITIAL  2.  Pt will be able to improve rep count during 30 second sit-to-stand test to at least 14 in order to demonstrate improved functional independence. Baseline: 7 reps  Goal status: INITIAL  3.  Pt will decrease TUG time to at least 11 seconds in order to be below cutoff for fall-risk. Baseline: 20 seconds, no AD Goal status: INITIAL  4.  Pt will demonstrate at least 4/5 MMT scores for hip strength bilaterally in  order to show improvement in LE strength needed for improved mobility and independence. Baseline: See above for initial MMT scores Goal status: INITIAL  5.  Pt will improve distance by at least 50 meters with LRAD in order to show improved cardiovascular and LE endurance. Baseline: 776 feet (12/27/2022) Goal status: INITIAL    PLAN:  PT FREQUENCY: 2x/week  PT DURATION: 8 weeks  PLANNED INTERVENTIONS: Therapeutic exercises, Therapeutic activity, Neuromuscular re-education, Balance training, Gait training, Patient/Family education, Joint mobilization, Stair training, Cryotherapy, Moist heat, and Manual therapy  PLAN FOR NEXT SESSION: Modify/progress HEP as tolerated, progress LE strengthening exercises, progress L knee flexion AROM/PROM as pain tolerates.   Cammie Mcgee,  PT, DPT # [phone] 01/05/23, 4:42 PM

## 2023-01-10 ENCOUNTER — Ambulatory Visit: Payer: Medicare Other | Attending: Orthopedic Surgery

## 2023-01-10 ENCOUNTER — Encounter: Payer: Self-pay | Admitting: Physical Therapy

## 2023-01-10 DIAGNOSIS — R269 Unspecified abnormalities of gait and mobility: Secondary | ICD-10-CM | POA: Diagnosis not present

## 2023-01-10 DIAGNOSIS — Z96652 Presence of left artificial knee joint: Secondary | ICD-10-CM | POA: Diagnosis not present

## 2023-01-10 DIAGNOSIS — M25562 Pain in left knee: Secondary | ICD-10-CM | POA: Diagnosis not present

## 2023-01-10 DIAGNOSIS — M6281 Muscle weakness (generalized): Secondary | ICD-10-CM | POA: Insufficient documentation

## 2023-01-10 DIAGNOSIS — R2681 Unsteadiness on feet: Secondary | ICD-10-CM | POA: Diagnosis not present

## 2023-01-10 DIAGNOSIS — M25662 Stiffness of left knee, not elsewhere classified: Secondary | ICD-10-CM | POA: Insufficient documentation

## 2023-01-10 NOTE — Therapy (Signed)
OUTPATIENT PHYSICAL THERAPY LOWER EXTREMITY TREATMENT   Patient Name: [name] MRN: [id number] DOB:[birth year], 72 y.o., female Today's Date: 01/10/2023  END OF SESSION:  PT End of Session - 01/05/23 1642     Number of Visits 6 of 16    Date for PT Re-Evaluation 02/16/23    PT Start Time 1636 to 1718  (42 minutes).   Activity Tolerance Patient limited by pain    Behavior During Therapy [facility] for tasks assessed/performed         Past Medical History:  Diagnosis Date   Arthritis    back, fingers with joint pain and swelling.  chronic back pain   Cataract    Chronic back pain    arthritis    Diverticulosis    Elevated cholesterol    takes Niacin daily and Simvastatin   GERD (gastroesophageal reflux disease) 06/2004   non-specific gastritis on EGD 06/2004   Headache(784.0)    occasionally   History of colon polyps 2004, 2009   2004:adenomatous. 2009 hyperplastic.    History of hiatal hernia    Hypertension    takes Tenoretic and Lisinopril daily   Mucoid cyst of joint 08/2013   right index finger   Neuromuscular disorder (HCC)    numbness in right leg after knee replacement   Osteopenia 01/2014   T score -1.1 FRAX 14%/0.5%. Stable from prior DEXA   PONV (postoperative nausea and vomiting)    Rotator cuff arthropathy    Left   Seasonal allergies    Sleep apnea    wears CPAP nightly   Stroke (HCC) 1998   x 2 - mild left-sided weakness   Urge incontinence    Uterine prolapse    Past Surgical History:  Procedure Laterality Date   BACK SURGERY     x 2   BELPHAROPTOSIS REPAIR Bilateral 12/14/2017   BUBBLE STUDY  02/16/2021   Procedure: BUBBLE STUDY;  Surgeon: Vesta Mixer, MD;  Location: [facility] ENDOSCOPY;  Service: Cardiovascular;;   CARPAL TUNNEL RELEASE Bilateral    Dr August Saucer   CATARACT EXTRACTION     CATARACT EXTRACTION Bilateral 10/2017   COLONOSCOPY  2004, 2009, 2014   FINGER SURGERY     GUM SURGERY     GYNECOLOGIC CRYOSURGERY     HYSTEROSCOPY WITH D  & C  12/07/2010   with resection of endometrial polyp   JOINT REPLACEMENT     KNEE ARTHROSCOPY Left 2008   lip biopsy     done at MD office Fri 11/22/13   MASS EXCISION Right 08/15/2013   Procedure: RIGHT INDEX EXCISION MASS ;  Surgeon: Tami Ribas, MD;  Location: [facility];  Service: Orthopedics;  Laterality: Right;   NASAL SEPTUM SURGERY     OOPHORECTOMY Right 2000   REVERSE SHOULDER ARTHROPLASTY Left 12/11/2018   REVERSE SHOULDER ARTHROPLASTY Left 12/11/2018   Procedure: LEFT REVERSE SHOULDER ARTHROPLASTY;  Surgeon: Cammy Copa, MD;  Location: [facility] OR;  Service: Orthopedics;  Laterality: Left;   SHOULDER ARTHROSCOPY WITH OPEN ROTATOR CUFF REPAIR AND DISTAL CLAVICLE ACROMINECTOMY Right 11/26/2013   Procedure: RIGHT SHOULDER ARTHROSCOPY WITH MINI OPEN ROTATOR CUFF REPAIR AND DISTAL CLAVICLE RESECTION, SUBACROMIAL DECOMPRESSION, POSSIBLE [facility] PATCH.;  Surgeon: Valeria Batman, MD;  Location: [facility] OR;  Service: Orthopedics;  Laterality: Right;   TEE WITHOUT CARDIOVERSION N/A 02/16/2021   Procedure: TRANSESOPHAGEAL ECHOCARDIOGRAM (TEE);  Surgeon: Elease Hashimoto Deloris Ping, MD;  Location: [facility] ENDOSCOPY;  Service: Cardiovascular;  Laterality: N/A;   TOTAL KNEE ARTHROPLASTY  Right 03/21/2017   TOTAL KNEE ARTHROPLASTY Right 03/21/2017   Procedure: RIGHT TOTAL KNEE ARTHROPLASTY;  Surgeon: Valeria Batman, MD;  Location: [facility] OR;  Service: Orthopedics;  Laterality: Right;   TOTAL KNEE ARTHROPLASTY Left 11/29/2022   Procedure: LEFT TOTAL KNEE ARTHROPLASTY;  Surgeon: Cammy Copa, MD;  Location: [facility] ORS;  Service: Orthopedics;  Laterality: Left;   TUBAL LIGATION     Patient Active Problem List   Diagnosis Date Noted   S/P total knee replacement, left 11/29/2022   Urinary incontinence 03/25/2022   ICH (intracerebral hemorrhage) (HCC) 02/13/2021   Iron deficiency 10/07/2020   Aortic atherosclerosis (HCC) 03/17/2020   Lung nodule seen on imaging study 02/24/2020   OA  (osteoarthritis) of shoulder 12/11/2018   Chronic left shoulder pain 08/23/2018   Arthritis of left knee 11/02/2016   Chronic diarrhea 12/07/2014   Segmental colitis with rectal bleeding (HCC) 09/12/2014   Routine general medical examination at a health care facility 06/05/2014   Overweight 06/13/2011   Hyperlipidemia 02/17/2007   Essential hypertension 01/08/2007   Osteoarthritis 01/08/2007    PCP: Myrlene Broker, MD  REFERRING PROVIDER: Cammy Copa, MD  REFERRING DIAG: S/P total knee replacement, left  THERAPY DIAG:  Total knee replacement status, left  Stiffness of left knee, not elsewhere classified  Gait difficulty  Muscle weakness (generalized)  Rationale for Evaluation and Treatment: Rehabilitation  ONSET DATE: 11/29/2022  SUBJECTIVE:   SUBJECTIVE STATEMENT: Pt reports to PT s/p L TKA on 11/29/2022. She reports falling in hospital during her stay which also resulted in her hurting her R wrist; pt wearing a wrist brace upon arrival and using RW with armrest for R UE. Pt has had HHPT coming to visit 2-3 times a week since discharging from the hospital. Pt also reports mild numbness/tingling in the medial/lateral aspects of her L knee, as well as completely down her R leg from the knee down. Pt has hx of R TKA ~6 years ago.  PERTINENT HISTORY: Hx of R TKA (~6 years ago), reverse TSA on LUE, HTN PAIN:  Are you having pain? Yes: NPRS scale: 3/10 now/best, 8/10 at worst Pain location: L knee Aggravating factors: activity, exercise Relieving factors: Ice (polar care), tylenol, pt reports taking oxy at night PRN to help sleep  PRECAUTIONS: None  RED FLAGS: None   WEIGHT BEARING RESTRICTIONS: No  FALLS:  Has patient fallen in last 6 months? Yes. Number of falls 1  LIVING ENVIRONMENT: Lives with: lives with their spouse Lives in: House/apartment Stairs: Yes: Internal: 1 flight of steps (doesn't need to use); External: 2 steps, rail on R going  up Has following equipment at home: Single point cane and Anahli Arvanitis - 4 wheeled  PLOF: Independent  PATIENT GOALS: Get back to being able to take care of/walk dogs, get back to walking without AD  NEXT MD VISIT: TBD  OBJECTIVE:   DIAGNOSTIC FINDINGS:  XR Wrist Complete Right:  PA, oblique, lateral, navicular views of right wrist reviewed.  No  fracture or dislocation noted.  Severe degenerative changes noted of the first [facility] joint with mild degenerative changes of the radiocarpal joint and STT joint.  Mild to moderate degenerative changes of the first MCP and IP joints.   PATIENT SURVEYS:  FOTO 43/54  COGNITION: Overall cognitive status: Within functional limits for tasks assessed     SENSATION: Not formally tested however pt reports numbness down R LE ever since R TKA ~6 years ago  EDEMA:  Circumferential measurements:  -  Right: Joint line = 45 cm, 2 inches superior to patella = 47 cm, 2 inches distal to patella = 40.5 cm  - Left: Joint line = 44 cm, 2 inches superior to patella = 49.5 cm, 2 inches distal to patella = 42 cm  POSTURE: flexed trunk   PALPATION: L knee incision site examined, no excess redness or warmth noted; mild swelling still present.   LOWER EXTREMITY ROM/:  R knee AROM: 0-102 degrees (in supine)  L knee AROM: 0-89 degrees (in supine); pt able to achieve 97 degrees active knee flexion in sitting   JOINT MOBILITY ASSESSMENTS:  L Tibiofemoral Joint:   - A/P and P/A glides hypomobile with tight end feel   - Medial/Lateral glides WNL   - IR hypomobile with soft end feel   - ER hypomobile with soft end feel, painful   -Patellar glides me/lat/sup/inf     LOWER EXTREMITY MMT:  MMT Right eval Left eval  Hip flexion 3+ (in supine) 3+ (in supine)  Hip extension 3 3-  Hip abduction 3 3  Hip adduction 3 3  Hip internal rotation    Hip external rotation    Knee flexion 3+   Knee extension    Ankle dorsiflexion    Ankle plantarflexion    Ankle  inversion    Ankle eversion    Knee strength testing deferred on L side secondary to pain  FUNCTIONAL TESTS:  30 seconds chair stand test: 7 reps Timed up and go (TUG): 20 seconds, no AD  BALANCE TESTS: SLS: 5-6 seconds bilaterally, increased instability with L side  10+ seconds tandem stance bilaterally   GAIT: Distance walked: 20 feet during TUG Assistive device utilized: None Level of assistance: Complete Independence Comments: Slow, cautious gait pattern and speed. Slow turns.    TODAY'S TREATMENT:                                                                                                                              DATE: 01/10/2023    Subjective: Pt reports to PT with 4/10 pain in her L knee/ quad today. Patient arrives to treatment session with SPC.     Therex:  Nustep L3 (seat position 6-4) x 10 mins for B LE strengthening and endurance, interval history taken during.  Cues to keep SPM >60   Standing knee flexion stretch on 2nd step, BUE support, 10 reps of 5-10 second holds into L knee flexion   LAQ with 3# AW: 2x15 BLE  High knees in // bars with 3# AW: 4 laps   Sidesteps in // bars with 3# AW: 4 laps  Step taps 6" step 2 x 10 each LE with 3# AW   Fwd step ups 6" step 2 x 10 leading with each LE    Not today:  Sitting L knee flexion passive stretch holds with MET into extension: x10 reps - increased pain with end-range knee flexion - 110 degrees  L knee flexion AROM at end of set  Manual Therapy:   Supine L A/P  tibiofemoral glides: grades 2-3 x 3 minutes Supine STM to L distal quad/ hamstring.  Instructed in scar massage with use of Vitamin E lotion.  Moderate crepitus under scar with cross-friction massage.  Improved scar appearance after massage.  Supine L knee flexion 110 deg. (AROM)- pain tolerable.  L knee 113 deg. With marked increase in pain.    PATIENT EDUCATION:  Education details: Pt educated on HEP completion and frequency, educated to avoid  using lotion at incision site until scarring/stitches have healed to avoid infection Person educated: Patient Education method: Medical illustrator Education comprehension: verbalized understanding  HOME EXERCISE PROGRAM: Access Code: [email or web address] URL: [email or web address] Date: 12/29/2022 Prepared by: Janet Berlin  Exercises - Supine Bridge with Resistance Band  - 1 x daily - 7 x weekly - 3 sets - 10 reps - Seated Long Arc Quad with Ankle Weight  - 1 x daily - 7 x weekly - 3 sets - 10 reps - Supine Hip Flexion with Ankle Weight  - 1 x daily - 7 x weekly - 3 sets - 10 reps - Prone Hip Extension with Ankle Weight  - 1 x daily - 7 x weekly - 3 sets - 10 reps - Sit to Stand with Resistance Around Legs  - 1 x daily - 7 x weekly - 3 sets - 10 reps  ASSESSMENT:  CLINICAL IMPRESSION: Patient arrives to treatment session with consistent complaints of L knee pain/soreness. Session focused on BLE strengthening with increased resistance. Requires frequent seated rest breaks between exercises due to fatigue. Pt will benefit from continued PT services upon discharge to safely address deficits listed in patient problem list for decreased caregiver assistance and eventual return to PLOF.   OBJECTIVE IMPAIRMENTS: decreased activity tolerance, decreased balance, decreased endurance, difficulty walking, decreased ROM, decreased strength, and pain.   ACTIVITY LIMITATIONS: squatting, stairs, transfers, and locomotion level  PARTICIPATION LIMITATIONS: cleaning, laundry, community activity, and yard work  PERSONAL FACTORS: Age and 3+ comorbidities: Hx of R TKA, hx of reverse TSA on L UE, R wrist pain  are also affecting patient's functional outcome.   REHAB POTENTIAL: Good  CLINICAL DECISION MAKING: Stable/uncomplicated  EVALUATION COMPLEXITY: Low   GOALS:  SHORT TERM GOALS: Target date: 01/19/2023 Pt will be independent and compliant with HEP in order to increase L knee  flexion AROM to at least 110 degrees needed for improved functional mobility Baseline: 97 degrees AROM in sitting position Goal status: INITIAL  2.  Pt will report subjective improvement in L knee pain with exercise on NPS scale by at least 3 points in order to show clinically significant improvement in pain levels. Baseline: 8/10 Goal status: INITIAL   LONG TERM GOALS: Target date: 02/16/2023  Pt will improve FOTO to at least 54 in order to show improved pain and function with ADL's and functional mobility. Baseline: Initial 43 Goal status: INITIAL  2.  Pt will be able to improve rep count during 30 second sit-to-stand test to at least 14 in order to demonstrate improved functional independence. Baseline: 7 reps  Goal status: INITIAL  3.  Pt will decrease TUG time to at least 11 seconds in order to be below cutoff for fall-risk. Baseline: 20 seconds, no AD Goal status: INITIAL  4.  Pt will demonstrate at least 4/5 MMT scores for hip strength bilaterally in order to show improvement in LE strength needed for improved mobility  and independence. Baseline: See above for initial MMT scores Goal status: INITIAL  5.  Pt will improve distance by at least 50 meters with LRAD in order to show improved cardiovascular and LE endurance. Baseline: 776 feet (12/27/2022) Goal status: INITIAL    PLAN:  PT FREQUENCY: 2x/week  PT DURATION: 8 weeks  PLANNED INTERVENTIONS: Therapeutic exercises, Therapeutic activity, Neuromuscular re-education, Balance training, Gait training, Patient/Family education, Joint mobilization, Stair training, Cryotherapy, Moist heat, and Manual therapy  PLAN FOR NEXT SESSION: Modify/progress HEP as tolerated, progress LE strengthening exercises, progress L knee flexion AROM/PROM as pain tolerates.   Maylon Peppers, PT, DPT Physical Therapist - [facility] 01/10/23, 4:36 PM

## 2023-01-11 ENCOUNTER — Ambulatory Visit (INDEPENDENT_AMBULATORY_CARE_PROVIDER_SITE_OTHER): Payer: Medicare Other | Admitting: Orthopedic Surgery

## 2023-01-11 DIAGNOSIS — M25531 Pain in right wrist: Secondary | ICD-10-CM

## 2023-01-11 MED ORDER — TRAZODONE HCL 50 MG PO TABS: 50.0000 mg | ORAL_TABLET | Freq: Every day | ORAL | 0 refills | Status: DC

## 2023-01-11 MED ORDER — AMOXICILLIN 500 MG PO CAPS: ORAL_CAPSULE | ORAL | 0 refills | Status: DC

## 2023-01-12 ENCOUNTER — Encounter: Payer: Self-pay | Admitting: Physical Therapy

## 2023-01-12 ENCOUNTER — Ambulatory Visit: Payer: Medicare Other

## 2023-01-12 DIAGNOSIS — Z96652 Presence of left artificial knee joint: Secondary | ICD-10-CM | POA: Diagnosis not present

## 2023-01-12 DIAGNOSIS — R2681 Unsteadiness on feet: Secondary | ICD-10-CM

## 2023-01-12 DIAGNOSIS — M25562 Pain in left knee: Secondary | ICD-10-CM | POA: Diagnosis not present

## 2023-01-12 DIAGNOSIS — R269 Unspecified abnormalities of gait and mobility: Secondary | ICD-10-CM

## 2023-01-12 DIAGNOSIS — M6281 Muscle weakness (generalized): Secondary | ICD-10-CM | POA: Diagnosis not present

## 2023-01-12 DIAGNOSIS — M25662 Stiffness of left knee, not elsewhere classified: Secondary | ICD-10-CM | POA: Diagnosis not present

## 2023-01-12 NOTE — Therapy (Signed)
OUTPATIENT PHYSICAL THERAPY LOWER EXTREMITY TREATMENT   Patient Name: [name] MRN: [id number] DOB:Dec 18, 1950, 72 y.o., female Today's Date: 01/12/2023  END OF SESSION:  PT End of Session - 01/05/23 1642     Number of Visits 7 of 16    Date for PT Re-Evaluation 02/16/23    PT Start Time 1640 to 1722  (42 minutes).   Activity Tolerance Patient limited by pain    Behavior During Therapy [facility] for tasks assessed/performed         Past Medical History:  Diagnosis Date   Arthritis    back, fingers with joint pain and swelling.  chronic back pain   Cataract    Chronic back pain    arthritis    Diverticulosis    Elevated cholesterol    takes Niacin daily and Simvastatin   GERD (gastroesophageal reflux disease) 06/2004   non-specific gastritis on EGD 06/2004   Headache(784.0)    occasionally   History of colon polyps 2004, 2009   2004:adenomatous. 2009 hyperplastic.    History of hiatal hernia    Hypertension    takes Tenoretic and Lisinopril daily   Mucoid cyst of joint 08/2013   right index finger   Neuromuscular disorder (HCC)    numbness in right leg after knee replacement   Osteopenia 01/2014   T score -1.1 FRAX 14%/0.5%. Stable from prior DEXA   PONV (postoperative nausea and vomiting)    Rotator cuff arthropathy    Left   Seasonal allergies    Sleep apnea    wears CPAP nightly   Stroke (HCC) 1998   x 2 - mild left-sided weakness   Urge incontinence    Uterine prolapse    Past Surgical History:  Procedure Laterality Date   BACK SURGERY     x 2   BELPHAROPTOSIS REPAIR Bilateral 12/14/2017   BUBBLE STUDY  02/16/2021   Procedure: BUBBLE STUDY;  Surgeon: Vesta Mixer, MD;  Location: [facility] ENDOSCOPY;  Service: Cardiovascular;;   CARPAL TUNNEL RELEASE Bilateral    Dr August Saucer   CATARACT EXTRACTION     CATARACT EXTRACTION Bilateral 10/2017   COLONOSCOPY  2004, 2009, 2014   FINGER SURGERY     GUM SURGERY     GYNECOLOGIC CRYOSURGERY     HYSTEROSCOPY WITH D  & C  12/07/2010   with resection of endometrial polyp   JOINT REPLACEMENT     KNEE ARTHROSCOPY Left 2008   lip biopsy     done at MD office Fri 11/22/13   MASS EXCISION Right 08/15/2013   Procedure: RIGHT INDEX EXCISION MASS ;  Surgeon: Tami Ribas, MD;  Location: [facility];  Service: Orthopedics;  Laterality: Right;   NASAL SEPTUM SURGERY     OOPHORECTOMY Right 2000   REVERSE SHOULDER ARTHROPLASTY Left 12/11/2018   REVERSE SHOULDER ARTHROPLASTY Left 12/11/2018   Procedure: LEFT REVERSE SHOULDER ARTHROPLASTY;  Surgeon: Cammy Copa, MD;  Location[facility];  Service: Orthopedics;  Laterality: Left;   SHOULDER ARTHROSCOPY WITH OPEN ROTATOR CUFF REPAIR AND DISTAL CLAVICLE ACROMINECTOMY Right 11/26/2013   Procedure: RIGHT SHOULDER ARTHROSCOPY WITH MINI OPEN ROTATOR CUFF REPAIR AND DISTAL CLAVICLE RESECTION, SUBACROMIAL DECOMPRESSION, POSSIBLE [facility] PATCH.;  Surgeon: Valeria Batman, MD;  Location: [facility] OR;  Service: Orthopedics;  Laterality: Right;   TEE WITHOUT CARDIOVERSION N/A 02/16/2021   Procedure: TRANSESOPHAGEAL ECHOCARDIOGRAM (TEE);  Surgeon: Elease Hashimoto Deloris Ping, MD;  Location: [facility] ENDOSCOPY;  Service: Cardiovascular;  Laterality: N/A;   TOTAL KNEE ARTHROPLASTY  Right 03/21/2017   TOTAL KNEE ARTHROPLASTY Right 03/21/2017   Procedure: RIGHT TOTAL KNEE ARTHROPLASTY;  Surgeon: Valeria Batman, MD;  Location: [facility] OR;  Service: Orthopedics;  Laterality: Right;   TOTAL KNEE ARTHROPLASTY Left 11/29/2022   Procedure: LEFT TOTAL KNEE ARTHROPLASTY;  Surgeon: Cammy Copa, MD;  Location: [facility] ORS;  Service: Orthopedics;  Laterality: Left;   TUBAL LIGATION     Patient Active Problem List   Diagnosis Date Noted   S/P total knee replacement, left 11/29/2022   Urinary incontinence 03/25/2022   ICH (intracerebral hemorrhage) (HCC) 02/13/2021   Iron deficiency 10/07/2020   Aortic atherosclerosis (HCC) 03/17/2020   Lung nodule seen on imaging study 02/24/2020   OA  (osteoarthritis) of shoulder 12/11/2018   Chronic left shoulder pain 08/23/2018   Arthritis of left knee 11/02/2016   Chronic diarrhea 12/07/2014   Segmental colitis with rectal bleeding (HCC) 09/12/2014   Routine general medical examination at a health care facility 06/05/2014   Overweight 06/13/2011   Hyperlipidemia 02/17/2007   Essential hypertension 01/08/2007   Osteoarthritis 01/08/2007    PCP: Myrlene Broker, MD  REFERRING PROVIDER: Cammy Copa, MD  REFERRING DIAG: S/P total knee replacement, left  THERAPY DIAG:  Total knee replacement status, left  Stiffness of left knee, not elsewhere classified  Gait difficulty  Muscle weakness (generalized)  Unsteadiness on feet  Rationale for Evaluation and Treatment: Rehabilitation  ONSET DATE: 11/29/2022  SUBJECTIVE:   SUBJECTIVE STATEMENT: Pt reports to PT s/p L TKA on 11/29/2022. She reports falling in hospital during her stay which also resulted in her hurting her R wrist; pt wearing a wrist brace upon arrival and using RW with armrest for R UE. Pt has had HHPT coming to visit 2-3 times a week since discharging from the hospital. Pt also reports mild numbness/tingling in the medial/lateral aspects of her L knee, as well as completely down her R leg from the knee down. Pt has hx of R TKA ~6 years ago.  PERTINENT HISTORY: Hx of R TKA (~6 years ago), reverse TSA on LUE, HTN PAIN:  Are you having pain? Yes: NPRS scale: 3/10 now/best, 8/10 at worst Pain location: L knee Aggravating factors: activity, exercise Relieving factors: Ice (polar care), tylenol, pt reports taking oxy at night PRN to help sleep  PRECAUTIONS: None  RED FLAGS: None   WEIGHT BEARING RESTRICTIONS: No  FALLS:  Has patient fallen in last 6 months? Yes. Number of falls 1  LIVING ENVIRONMENT: Lives with: lives with their spouse Lives in: House/apartment Stairs: Yes: Internal: 1 flight of steps (doesn't need to use); External: 2  steps, rail on R going up Has following equipment at home: Single point cane and [name] - 4 wheeled  PLOF: Independent  PATIENT GOALS: Get back to being able to take care of/walk dogs, get back to walking without AD  NEXT MD VISIT: TBD  OBJECTIVE:   DIAGNOSTIC FINDINGS:  XR Wrist Complete Right:  PA, oblique, lateral, navicular views of right wrist reviewed.  No  fracture or dislocation noted.  Severe degenerative changes noted of the first [facility] joint with mild degenerative changes of the radiocarpal joint and STT joint.  Mild to moderate degenerative changes of the first MCP and IP joints.   PATIENT SURVEYS:  FOTO 43/54  COGNITION: Overall cognitive status: Within functional limits for tasks assessed     SENSATION: Not formally tested however pt reports numbness down R LE ever since R TKA ~6 years ago  EDEMA:  Circumferential measurements:  - Right: Joint line = 45 cm, 2 inches superior to patella = 47 cm, 2 inches distal to patella = 40.5 cm  - Left: Joint line = 44 cm, 2 inches superior to patella = 49.5 cm, 2 inches distal to patella = 42 cm  POSTURE: flexed trunk   PALPATION: L knee incision site examined, no excess redness or warmth noted; mild swelling still present.   LOWER EXTREMITY ROM/:  R knee AROM: 0-102 degrees (in supine)  L knee AROM: 0-89 degrees (in supine); pt able to achieve 97 degrees active knee flexion in sitting   JOINT MOBILITY ASSESSMENTS:  L Tibiofemoral Joint:   - A/P and P/A glides hypomobile with tight end feel   - Medial/Lateral glides WNL   - IR hypomobile with soft end feel   - ER hypomobile with soft end feel, painful   -Patellar glides me/lat/sup/inf     LOWER EXTREMITY MMT:  MMT Right eval Left eval  Hip flexion 3+ (in supine) 3+ (in supine)  Hip extension 3 3-  Hip abduction 3 3  Hip adduction 3 3  Hip internal rotation    Hip external rotation    Knee flexion 3+   Knee extension    Ankle dorsiflexion    Ankle  plantarflexion    Ankle inversion    Ankle eversion    Knee strength testing deferred on L side secondary to pain  FUNCTIONAL TESTS:  30 seconds chair stand test: 7 reps Timed up and go (TUG): 20 seconds, no AD  BALANCE TESTS: SLS: 5-6 seconds bilaterally, increased instability with L side  10+ seconds tandem stance bilaterally   GAIT: Distance walked: 20 feet during TUG Assistive device utilized: None Level of assistance: Complete Independence Comments: Slow, cautious gait pattern and speed. Slow turns.    TODAY'S TREATMENT:                                                                                                                              DATE: 01/12/2023     Subjective: Patient arrives to treatment session with [facility] and R wrist brace. Patient had follow up visit with MD and reports that he believes she should be able to extend the knee more than she is. Has MRI of R hand/wrist on Wednesday (10/9)  After Nustep-  L knee flexion: 114 degrees L knee extension: -6 degrees   Therex:  Nustep L3 (seat position 6-4) x 10 mins for B LE strengthening and endurance, interval history taken during.  Cues to keep SPM >60   Standing knee flexion stretch on 2nd step, BUE support, 10 reps of 5-10 second holds into L knee flexion   Supine SLR/marching 2 x 10 each    LAQ with 3# AW: 2x15 BLE  Step taps 6" step 2 x 10 each LE with 3# AW   Sit to stand 3 x 10 with no UE support   Manual:  Supine L AP tibiofemoral glides; grade II-III x 3 minutes  Supine L knee extension stretch with PT overpressure with patient's foot on bolster   L knee flexion: 114 degrees L knee extension: -2 degrees  Not today:  Sitting L knee flexion passive stretch holds with MET into extension: x10 reps - increased pain with end-range knee flexion - 110 degrees L knee flexion AROM at end of set  Manual Therapy:   Supine L A/P  tibiofemoral glides: grades 2-3 x 3 minutes Supine STM to L distal quad/  hamstring.  Instructed in scar massage with use of Vitamin E lotion.  Moderate crepitus under scar with cross-friction massage.  Improved scar appearance after massage.  Supine L knee flexion 110 deg. (AROM)- pain tolerable.  L knee 113 deg. With marked increase in pain.    PATIENT EDUCATION:  Education details: Pt educated on HEP completion and frequency, educated to avoid using lotion at incision site until scarring/stitches have healed to avoid infection Person educated: Patient Education method: Medical illustrator Education comprehension: verbalized understanding  HOME EXERCISE PROGRAM: Access Code: [email or web address] URL: [email or web address] Date: 12/29/2022 Prepared by: Janet Berlin  Exercises - Supine Bridge with Resistance Band  - 1 x daily - 7 x weekly - 3 sets - 10 reps - Seated Long Arc Quad with Ankle Weight  - 1 x daily - 7 x weekly - 3 sets - 10 reps - Supine Hip Flexion with Ankle Weight  - 1 x daily - 7 x weekly - 3 sets - 10 reps - Prone Hip Extension with Ankle Weight  - 1 x daily - 7 x weekly - 3 sets - 10 reps - Sit to Stand with Resistance Around Legs  - 1 x daily - 7 x weekly - 3 sets - 10 reps  ASSESSMENT:  CLINICAL IMPRESSION:  Patient arrives to treatment session with consistent complaints of L knee pain/soreness. Session focused on manual mobilizations/stretching to improve L knee extension and BLE strengthening. Pt will benefit from continued PT services upon discharge to safely address deficits listed in patient problem list for decreased caregiver assistance and eventual return to PLOF.   OBJECTIVE IMPAIRMENTS: decreased activity tolerance, decreased balance, decreased endurance, difficulty walking, decreased ROM, decreased strength, and pain.   ACTIVITY LIMITATIONS: squatting, stairs, transfers, and locomotion level  PARTICIPATION LIMITATIONS: cleaning, laundry, community activity, and yard work  PERSONAL FACTORS: Age and 3+  comorbidities: Hx of R TKA, hx of reverse TSA on L UE, R wrist pain  are also affecting patient's functional outcome.   REHAB POTENTIAL: Good  CLINICAL DECISION MAKING: Stable/uncomplicated  EVALUATION COMPLEXITY: Low   GOALS:  SHORT TERM GOALS: Target date: 01/19/2023 Pt will be independent and compliant with HEP in order to increase L knee flexion AROM to at least 110 degrees needed for improved functional mobility Baseline: 97 degrees AROM in sitting position Goal status: INITIAL  2.  Pt will report subjective improvement in L knee pain with exercise on NPS scale by at least 3 points in order to show clinically significant improvement in pain levels. Baseline: 8/10 Goal status: INITIAL   LONG TERM GOALS: Target date: 02/16/2023  Pt will improve FOTO to at least 54 in order to show improved pain and function with ADL's and functional mobility. Baseline: Initial 43 Goal status: INITIAL  2.  Pt will be able to improve rep count during 30 second sit-to-stand test to at least 14 in order to demonstrate improved functional independence. Baseline: 7 reps  Goal status: INITIAL  3.  Pt will decrease TUG time to at least 11 seconds in order to be below cutoff for fall-risk. Baseline: 20 seconds, no AD Goal status: INITIAL  4.  Pt will demonstrate at least 4/5 MMT scores for hip strength bilaterally in order to show improvement in LE strength needed for improved mobility and independence. Baseline: See above for initial MMT scores Goal status: INITIAL  5.  Pt will improve distance by at least 50 meters with LRAD in order to show improved cardiovascular and LE endurance. Baseline: 776 feet (12/27/2022) Goal status: INITIAL    PLAN:  PT FREQUENCY: 2x/week  PT DURATION: 8 weeks  PLANNED INTERVENTIONS: Therapeutic exercises, Therapeutic activity, Neuromuscular re-education, Balance training, Gait training, Patient/Family education, Joint mobilization, Stair training,  Cryotherapy, Moist heat, and Manual therapy  PLAN FOR NEXT SESSION: Modify/progress HEP as tolerated, progress LE strengthening exercises, progress L knee flexion AROM/PROM as pain tolerates.   Maylon Peppers, PT, DPT Physical Therapist - [facility] 01/12/23, 4:38 PM

## 2023-01-13 ENCOUNTER — Encounter: Payer: Self-pay | Admitting: Orthopedic Surgery

## 2023-01-13 NOTE — Progress Notes (Signed)
Post-Op Visit Note   Patient: [name]           Date of Birth: 14-Sep-1950           MRN: [id number] Visit Date: 01/11/2023 PCP: Myrlene Broker, MD   Assessment & Plan:  Chief Complaint:  Chief Complaint  Patient presents with   Left Knee - Routine Post Op    Left total knee arthroplasty 11/29/2022      Visit Diagnoses:  1. Pain in right wrist     Plan: [name] is a 72 year old patient underwent left total knee replacement 11/29/2022.  She is about 6 weeks out.  Going to therapy 2 times a week.  On examination she has range of motion 3-1 15.  Patient also describes continued right wrist pain following a fall in the hospital.  Not really improved.  Localizes the pain to the radial side.  Radiographs negative for fracture.  She is still having some problems sleeping.  Plan at this time is to try trazodone to help her sleep.  Antibiotic sent in for tooth procedure coming up 3 months after her knee replacement.  MRI right wrist indicated to evaluate occult trauma status post fall in the hospital.  She does have dorsiflexion of that right wrist to about 45 degrees which is 10 to 15 degrees less then the left-hand side.  No snuffbox tenderness and not too much effusion in the wrist joint.  Follow-Up Instructions: Return in about 6 weeks (around 02/22/2023).   Orders:  Orders Placed This Encounter  Procedures   MR Wrist Right w/o contrast   Meds ordered this encounter  Medications   traZODone (DESYREL) 50 MG tablet    Sig: Take 1 tablet (50 mg total) by mouth at bedtime.    Dispense:  25 tablet    Refill:  0   amoxicillin (AMOXIL) 500 MG capsule    Sig: 2g 1 hour prior to dental procedure    Dispense:  10 capsule    Refill:  0    Imaging: No results found.  PMFS History: Patient Active Problem List   Diagnosis Date Noted   S/P total knee replacement, left 11/29/2022   Urinary incontinence 03/25/2022   ICH (intracerebral hemorrhage) (HCC) 02/13/2021   Iron  deficiency 10/07/2020   Aortic atherosclerosis (HCC) 03/17/2020   Lung nodule seen on imaging study 02/24/2020   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   Chronic left shoulder pain 08/23/2018   Arthritis of left knee 11/02/2016   Chronic diarrhea 12/07/2014   Segmental colitis with rectal bleeding (HCC) 09/12/2014   Routine general medical examination at a health care facility 06/05/2014   Overweight 06/13/2011   Hyperlipidemia 02/17/2007   Essential hypertension 01/08/2007   Osteoarthritis 01/08/2007   Past Medical History:  Diagnosis Date   Arthritis    back, fingers with joint pain and swelling.  chronic back pain   Cataract    Chronic back pain    arthritis    Diverticulosis    Elevated cholesterol    takes Niacin daily and Simvastatin   GERD (gastroesophageal reflux disease) 06/2004   non-specific gastritis on EGD 06/2004   Headache(784.0)    occasionally   History of colon polyps 2004, 2009   2004:adenomatous. 2009 hyperplastic.    History of hiatal hernia    Hypertension    takes Tenoretic and Lisinopril daily   Mucoid cyst of joint 08/2013   right index finger   Neuromuscular disorder (HCC)  numbness in right leg after knee replacement   Osteopenia 01/2014   T score -1.1 FRAX 14%/0.5%. Stable from prior DEXA   PONV (postoperative nausea and vomiting)    Rotator cuff arthropathy    Left   Seasonal allergies    Sleep apnea    wears CPAP nightly   Stroke (HCC) 1998   x 2 - mild left-sided weakness   Urge incontinence    Uterine prolapse     Family History  Problem Relation Age of Onset   Hypertension Mother    Heart disease Mother    Diabetes Father    Hypertension Father    Heart disease Father    Stroke Father    Diabetes Maternal Aunt    Diabetes Paternal Grandmother    Hypertension Paternal Grandfather    Stroke Paternal Grandfather    Colon cancer Neg Hx    Esophageal cancer Neg Hx    Rectal cancer Neg Hx    Stomach cancer Neg Hx     Sleep apnea Neg Hx     Past Surgical History:  Procedure Laterality Date   BACK SURGERY     x 2   BELPHAROPTOSIS REPAIR Bilateral 12/14/2017   BUBBLE STUDY  02/16/2021   Procedure: BUBBLE STUDY;  Surgeon: Vesta Mixer, MD;  Location: [facility] ENDOSCOPY;  Service: Cardiovascular;;   CARPAL TUNNEL RELEASE Bilateral    Dr August Saucer   CATARACT EXTRACTION     CATARACT EXTRACTION Bilateral 10/2017   COLONOSCOPY  2004, 2009, 2014   FINGER SURGERY     GUM SURGERY     GYNECOLOGIC CRYOSURGERY     HYSTEROSCOPY WITH D & C  12/07/2010   with resection of endometrial polyp   JOINT REPLACEMENT     KNEE ARTHROSCOPY Left 2008   lip biopsy     done at MD office Fri 11/22/13   MASS EXCISION Right 08/15/2013   Procedure: RIGHT INDEX EXCISION MASS ;  Surgeon: Tami Ribas, MD;  Location: [facility];  Service: Orthopedics;  Laterality: Right;   NASAL SEPTUM SURGERY     OOPHORECTOMY Right 2000   REVERSE SHOULDER ARTHROPLASTY Left 12/11/2018   REVERSE SHOULDER ARTHROPLASTY Left 12/11/2018   Procedure: LEFT REVERSE SHOULDER ARTHROPLASTY;  Surgeon: Cammy Copa, MD;  Location: [facility] OR;  Service: Orthopedics;  Laterality: Left;   SHOULDER ARTHROSCOPY WITH OPEN ROTATOR CUFF REPAIR AND DISTAL CLAVICLE ACROMINECTOMY Right 11/26/2013   Procedure: RIGHT SHOULDER ARTHROSCOPY WITH MINI OPEN ROTATOR CUFF REPAIR AND DISTAL CLAVICLE RESECTION, SUBACROMIAL DECOMPRESSION, POSSIBLE [facility] PATCH.;  Surgeon: Valeria Batman, MD;  Location: [facility] OR;  Service: Orthopedics;  Laterality: Right;   TEE WITHOUT CARDIOVERSION N/A 02/16/2021   Procedure: TRANSESOPHAGEAL ECHOCARDIOGRAM (TEE);  Surgeon: Elease Hashimoto Deloris Ping, MD;  Location: [facility] ENDOSCOPY;  Service: Cardiovascular;  Laterality: N/A;   TOTAL KNEE ARTHROPLASTY Right 03/21/2017   TOTAL KNEE ARTHROPLASTY Right 03/21/2017   Procedure: RIGHT TOTAL KNEE ARTHROPLASTY;  Surgeon: Valeria Batman, MD;  Location: [facility] OR;  Service: Orthopedics;  Laterality: Right;    TOTAL KNEE ARTHROPLASTY Left 11/29/2022   Procedure: LEFT TOTAL KNEE ARTHROPLASTY;  Surgeon: Cammy Copa, MD;  Location: [facility]S;  Service: Orthopedics;  Laterality: Left;   TUBAL LIGATION     Social History   Occupational History   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

## 2023-01-17 ENCOUNTER — Ambulatory Visit: Payer: Medicare Other

## 2023-01-17 DIAGNOSIS — R2681 Unsteadiness on feet: Secondary | ICD-10-CM

## 2023-01-17 DIAGNOSIS — R269 Unspecified abnormalities of gait and mobility: Secondary | ICD-10-CM | POA: Diagnosis not present

## 2023-01-17 DIAGNOSIS — M25662 Stiffness of left knee, not elsewhere classified: Secondary | ICD-10-CM | POA: Diagnosis not present

## 2023-01-17 DIAGNOSIS — M6281 Muscle weakness (generalized): Secondary | ICD-10-CM | POA: Diagnosis not present

## 2023-01-17 DIAGNOSIS — Z96652 Presence of left artificial knee joint: Secondary | ICD-10-CM | POA: Diagnosis not present

## 2023-01-17 DIAGNOSIS — M25562 Pain in left knee: Secondary | ICD-10-CM | POA: Diagnosis not present

## 2023-01-17 NOTE — Therapy (Signed)
OUTPATIENT PHYSICAL THERAPY LOWER EXTREMITY TREATMENT   Patient Name: [name] MRN: [id number] DOB:15-Nov-1950, 72 y.o., female Today's Date: 01/17/2023  END OF SESSION:  PT End of Session - 01/05/23 1642     Number of Visits 78of 16    Date for PT Re-Evaluation 02/16/23    PT Start Time 1348 to 1430  (42 minutes).   Activity Tolerance Patient limited by pain    Behavior During Therapy [facility] for tasks assessed/performed         Past Medical History:  Diagnosis Date   Arthritis    back, fingers with joint pain and swelling.  chronic back pain   Cataract    Chronic back pain    arthritis    Diverticulosis    Elevated cholesterol    takes Niacin daily and Simvastatin   GERD (gastroesophageal reflux disease) 06/2004   non-specific gastritis on EGD 06/2004   Headache(784.0)    occasionally   History of colon polyps 2004, 2009   2004:adenomatous. 2009 hyperplastic.    History of hiatal hernia    Hypertension    takes Tenoretic and Lisinopril daily   Mucoid cyst of joint 08/2013   right index finger   Neuromuscular disorder (HCC)    numbness in right leg after knee replacement   Osteopenia 01/2014   T score -1.1 FRAX 14%/0.5%. Stable from prior DEXA   PONV (postoperative nausea and vomiting)    Rotator cuff arthropathy    Left   Seasonal allergies    Sleep apnea    wears CPAP nightly   Stroke (HCC) 1998   x 2 - mild left-sided weakness   Urge incontinence    Uterine prolapse    Past Surgical History:  Procedure Laterality Date   BACK SURGERY     x 2   BELPHAROPTOSIS REPAIR Bilateral 12/14/2017   BUBBLE STUDY  02/16/2021   Procedure: BUBBLE STUDY;  Surgeon: Vesta Mixer, MD;  Location: [facility] ENDOSCOPY;  Service: Cardiovascular;;   CARPAL TUNNEL RELEASE Bilateral    Dr August Saucer   CATARACT EXTRACTION     CATARACT EXTRACTION Bilateral 10/2017   COLONOSCOPY  2004, 2009, 2014   FINGER SURGERY     GUM SURGERY     GYNECOLOGIC CRYOSURGERY     HYSTEROSCOPY WITH D  & C  12/07/2010   with resection of endometrial polyp   JOINT REPLACEMENT     KNEE ARTHROSCOPY Left 2008   lip biopsy     done at MD office Fri 11/22/13   MASS EXCISION Right 08/15/2013   Procedure: RIGHT INDEX EXCISION MASS ;  Surgeon: Tami Ribas, MD;  Location: [facility];  Service: Orthopedics;  Laterality: Right;   NASAL SEPTUM SURGERY     OOPHORECTOMY Right 2000   REVERSE SHOULDER ARTHROPLASTY Left 12/11/2018   REVERSE SHOULDER ARTHROPLASTY Left 12/11/2018   Procedure: LEFT REVERSE SHOULDER ARTHROPLASTY;  Surgeon: Cammy Copa, MD;  Location: [facility] OR;  Service: Orthopedics;  Laterality: Left;   SHOULDER ARTHROSCOPY WITH OPEN ROTATOR CUFF REPAIR AND DISTAL CLAVICLE ACROMINECTOMY Right 11/26/2013   Procedure: RIGHT SHOULDER ARTHROSCOPY WITH MINI OPEN ROTATOR CUFF REPAIR AND DISTAL CLAVICLE RESECTION, SUBACROMIAL DECOMPRESSION, POSSIBLE [facility] PATCH.;  Surgeon: Valeria Batman, MD;  Location: [facility] OR;  Service: Orthopedics;  Laterality: Right;   TEE WITHOUT CARDIOVERSION N/A 02/16/2021   Procedure: TRANSESOPHAGEAL ECHOCARDIOGRAM (TEE);  Surgeon: Elease Hashimoto Deloris Ping, MD;  Location: [facility] ENDOSCOPY;  Service: Cardiovascular;  Laterality: N/A;   TOTAL KNEE ARTHROPLASTY Right  03/21/2017   TOTAL KNEE ARTHROPLASTY Right 03/21/2017   Procedure: RIGHT TOTAL KNEE ARTHROPLASTY;  Surgeon: Valeria Batman, MD;  Location: [facility] OR;  Service: Orthopedics;  Laterality: Right;   TOTAL KNEE ARTHROPLASTY Left 11/29/2022   Procedure: LEFT TOTAL KNEE ARTHROPLASTY;  Surgeon: Cammy Copa, MD;  Location: [facility] ORS;  Service: Orthopedics;  Laterality: Left;   TUBAL LIGATION     Patient Active Problem List   Diagnosis Date Noted   S/P total knee replacement, left 11/29/2022   Urinary incontinence 03/25/2022   ICH (intracerebral hemorrhage) (HCC) 02/13/2021   Iron deficiency 10/07/2020   Aortic atherosclerosis (HCC) 03/17/2020   Lung nodule seen on imaging study 02/24/2020   OA  (osteoarthritis) of shoulder 12/11/2018   Chronic left shoulder pain 08/23/2018   Arthritis of left knee 11/02/2016   Chronic diarrhea 12/07/2014   Segmental colitis with rectal bleeding (HCC) 09/12/2014   Routine general medical examination at a health care facility 06/05/2014   Overweight 06/13/2011   Hyperlipidemia 02/17/2007   Essential hypertension 01/08/2007   Osteoarthritis 01/08/2007    PCP: Myrlene Broker, MD  REFERRING PROVIDER: Cammy Copa, MD  REFERRING DIAG: S/P total knee replacement, left  THERAPY DIAG:  Total knee replacement status, left  Stiffness of left knee, not elsewhere classified  Gait difficulty  Muscle weakness (generalized)  Unsteadiness on feet  Rationale for Evaluation and Treatment: Rehabilitation  ONSET DATE: 11/29/2022  SUBJECTIVE:   SUBJECTIVE STATEMENT: Pt reports to PT s/p L TKA on 11/29/2022. She reports falling in hospital during her stay which also resulted in her hurting her R wrist; pt wearing a wrist brace upon arrival and using RW with armrest for R UE. Pt has had HHPT coming to visit 2-3 times a week since discharging from the hospital. Pt also reports mild numbness/tingling in the medial/lateral aspects of her L knee, as well as completely down her R leg from the knee down. Pt has hx of R TKA ~6 years ago.  PERTINENT HISTORY: Hx of R TKA (~6 years ago), reverse TSA on LUE, HTN PAIN:  Are you having pain? Yes: NPRS scale: 3/10 now/best, 8/10 at worst Pain location: L knee Aggravating factors: activity, exercise Relieving factors: Ice (polar care), tylenol, pt reports taking oxy at night PRN to help sleep  PRECAUTIONS: None  RED FLAGS: None   WEIGHT BEARING RESTRICTIONS: No  FALLS:  Has patient fallen in last 6 months? Yes. Number of falls 1  LIVING ENVIRONMENT: Lives with: lives with their spouse Lives in: House/apartment Stairs: Yes: Internal: 1 flight of steps (doesn't need to use); External: 2  steps, rail on R going up Has following equipment at home: Single point cane and Vali Capano - 4 wheeled  PLOF: Independent  PATIENT GOALS: Get back to being able to take care of/walk dogs, get back to walking without AD  NEXT MD VISIT: TBD  OBJECTIVE:   DIAGNOSTIC FINDINGS:  XR Wrist Complete Right:  PA, oblique, lateral, navicular views of right wrist reviewed.  No  fracture or dislocation noted.  Severe degenerative changes noted of the first [facility] joint with mild degenerative changes of the radiocarpal joint and STT joint.  Mild to moderate degenerative changes of the first MCP and IP joints.   PATIENT SURVEYS:  FOTO 43/54  COGNITION: Overall cognitive status: Within functional limits for tasks assessed     SENSATION: Not formally tested however pt reports numbness down R LE ever since R TKA ~6 years ago  EDEMA:  Circumferential measurements:  - Right: Joint line = 45 cm, 2 inches superior to patella = 47 cm, 2 inches distal to patella = 40.5 cm  - Left: Joint line = 44 cm, 2 inches superior to patella = 49.5 cm, 2 inches distal to patella = 42 cm  POSTURE: flexed trunk   PALPATION: L knee incision site examined, no excess redness or warmth noted; mild swelling still present.   LOWER EXTREMITY ROM/:  R knee AROM: 0-102 degrees (in supine)  L knee AROM: 0-89 degrees (in supine); pt able to achieve 97 degrees active knee flexion in sitting   JOINT MOBILITY ASSESSMENTS:  L Tibiofemoral Joint:   - A/P and P/A glides hypomobile with tight end feel   - Medial/Lateral glides WNL   - IR hypomobile with soft end feel   - ER hypomobile with soft end feel, painful   -Patellar glides me/lat/sup/inf     LOWER EXTREMITY MMT:  MMT Right eval Left eval  Hip flexion 3+ (in supine) 3+ (in supine)  Hip extension 3 3-  Hip abduction 3 3  Hip adduction 3 3  Hip internal rotation    Hip external rotation    Knee flexion 3+   Knee extension    Ankle dorsiflexion    Ankle  plantarflexion    Ankle inversion    Ankle eversion    Knee strength testing deferred on L side secondary to pain  FUNCTIONAL TESTS:  30 seconds chair stand test: 7 reps Timed up and go (TUG): 20 seconds, no AD  BALANCE TESTS: SLS: 5-6 seconds bilaterally, increased instability with L side  10+ seconds tandem stance bilaterally   GAIT: Distance walked: 20 feet during TUG Assistive device utilized: None Level of assistance: Complete Independence Comments: Slow, cautious gait pattern and speed. Slow turns.    TODAY'S TREATMENT:                                                                                                                              DATE: 01/17/2023     Subjective: Patient arrives to treatment session with [facility] and R wrist brace. Has an MRI tomorrow (10/9) for her R wrist/hand. Having a rough time this date due to having to put her dog down this morning.   Therex:  Nustep L3 (seat position 6-4) x 10 mins for B LE strengthening and endurance, interval history taken during.  Cues to keep SPM >60   Standing knee flexion stretch on 2nd step, BUE support, 10 reps of 5-10 second holds into L knee flexion   Sit to stand 3 x 10 with no UE support    LAQ with 3# AW: 2x15 BLE with 3 sec hold   Seated L hamstring stretch 3 x 30 seconds   Standing marching with B UE support 2 x 10 each LE, 3# AW   Standing hip abduction with B UE support 2 x 10 each LE, 3# AW  Not today:  Sitting L knee flexion passive stretch holds with MET into extension: x10 reps - increased pain with end-range knee flexion - 110 degrees L knee flexion AROM at end of set  Manual Therapy:   Supine L A/P  tibiofemoral glides: grades 2-3 x 3 minutes Supine STM to L distal quad/ hamstring.  Instructed in scar massage with use of Vitamin E lotion.  Moderate crepitus under scar with cross-friction massage.  Improved scar appearance after massage.  Supine L knee flexion 110 deg. (AROM)- pain tolerable.   L knee 113 deg. With marked increase in pain.    PATIENT EDUCATION:  Education details: Pt educated on HEP completion and frequency, educated to avoid using lotion at incision site until scarring/stitches have healed to avoid infection Person educated: Patient Education method: Medical illustrator Education comprehension: verbalized understanding  HOME EXERCISE PROGRAM: Access Code: [email or web address] URL: [email or web address] Date: 12/29/2022 Prepared by: Janet Berlin  Exercises - Supine Bridge with Resistance Band  - 1 x daily - 7 x weekly - 3 sets - 10 reps - Seated Long Arc Quad with Ankle Weight  - 1 x daily - 7 x weekly - 3 sets - 10 reps - Supine Hip Flexion with Ankle Weight  - 1 x daily - 7 x weekly - 3 sets - 10 reps - Prone Hip Extension with Ankle Weight  - 1 x daily - 7 x weekly - 3 sets - 10 reps - Sit to Stand with Resistance Around Legs  - 1 x daily - 7 x weekly - 3 sets - 10 reps  ASSESSMENT:  CLINICAL IMPRESSION:  Patient arrives to treatment session with [facility] and ready to participate. Session focused on B hip/knee strengthening with resistance. Tolerated treatment session well. Encouraged patient to continue participating in HEP. Pt will benefit from continued PT services upon discharge to safely address deficits listed in patient problem list for decreased caregiver assistance and eventual return to PLOF.   OBJECTIVE IMPAIRMENTS: decreased activity tolerance, decreased balance, decreased endurance, difficulty walking, decreased ROM, decreased strength, and pain.   ACTIVITY LIMITATIONS: squatting, stairs, transfers, and locomotion level  PARTICIPATION LIMITATIONS: cleaning, laundry, community activity, and yard work  PERSONAL FACTORS: Age and 3+ comorbidities: Hx of R TKA, hx of reverse TSA on L UE, R wrist pain  are also affecting patient's functional outcome.   REHAB POTENTIAL: Good  CLINICAL DECISION MAKING: Stable/uncomplicated  EVALUATION  COMPLEXITY: Low   GOALS:  SHORT TERM GOALS: Target date: 01/19/2023 Pt will be independent and compliant with HEP in order to increase L knee flexion AROM to at least 110 degrees needed for improved functional mobility Baseline: 97 degrees AROM in sitting position Goal status: INITIAL  2.  Pt will report subjective improvement in L knee pain with exercise on NPS scale by at least 3 points in order to show clinically significant improvement in pain levels. Baseline: 8/10 Goal status: INITIAL   LONG TERM GOALS: Target date: 02/16/2023  Pt will improve FOTO to at least 54 in order to show improved pain and function with ADL's and functional mobility. Baseline: Initial 43 Goal status: INITIAL  2.  Pt will be able to improve rep count during 30 second sit-to-stand test to at least 14 in order to demonstrate improved functional independence. Baseline: 7 reps  Goal status: INITIAL  3.  Pt will decrease TUG time to at least 11 seconds in order to be below cutoff for fall-risk. Baseline: 20 seconds, no AD Goal status: INITIAL  4.  Pt will demonstrate at least 4/5 MMT scores for hip strength bilaterally in order to show improvement in LE strength needed for improved mobility and independence. Baseline: See above for initial MMT scores Goal status: INITIAL  5.  Pt will improve distance by at least 50 meters with LRAD in order to show improved cardiovascular and LE endurance. Baseline: 776 feet (12/27/2022) Goal status: INITIAL    PLAN:  PT FREQUENCY: 2x/week  PT DURATION: 8 weeks  PLANNED INTERVENTIONS: Therapeutic exercises, Therapeutic activity, Neuromuscular re-education, Balance training, Gait training, Patient/Family education, Joint mobilization, Stair training, Cryotherapy, Moist heat, and Manual therapy  PLAN FOR NEXT SESSION: Modify/progress HEP as tolerated, progress LE strengthening exercises, progress L knee flexion AROM/PROM as pain tolerates.   Maylon Peppers,  PT, DPT Physical Therapist - [facility] 01/17/23, 1:49 PM

## 2023-01-18 ENCOUNTER — Ambulatory Visit
Admission: RE | Admit: 2023-01-18 | Discharge: 2023-01-18 | Disposition: A | Discharge status: Home / Self Care | Payer: Medicare Other | Source: Ambulatory Visit | Attending: Orthopedic Surgery | Admitting: Orthopedic Surgery

## 2023-01-18 DIAGNOSIS — M25531 Pain in right wrist: Secondary | ICD-10-CM | POA: Diagnosis not present

## 2023-01-19 ENCOUNTER — Encounter: Payer: Self-pay | Admitting: Physical Therapy

## 2023-01-19 ENCOUNTER — Ambulatory Visit: Payer: Medicare Other

## 2023-01-19 DIAGNOSIS — Z96652 Presence of left artificial knee joint: Secondary | ICD-10-CM

## 2023-01-19 DIAGNOSIS — M25562 Pain in left knee: Secondary | ICD-10-CM | POA: Diagnosis not present

## 2023-01-19 DIAGNOSIS — R2681 Unsteadiness on feet: Secondary | ICD-10-CM

## 2023-01-19 DIAGNOSIS — M6281 Muscle weakness (generalized): Secondary | ICD-10-CM

## 2023-01-19 DIAGNOSIS — M25662 Stiffness of left knee, not elsewhere classified: Secondary | ICD-10-CM

## 2023-01-19 DIAGNOSIS — R269 Unspecified abnormalities of gait and mobility: Secondary | ICD-10-CM | POA: Diagnosis not present

## 2023-01-19 NOTE — Therapy (Signed)
OUTPATIENT PHYSICAL THERAPY LOWER EXTREMITY TREATMENT   Patient Name: [name] MRN: [id number] DOB:1950/11/20, 72 y.o., female Today's Date: 01/19/2023  END OF SESSION:  PT End of Session - 01/05/23 1642     Number of Visits 9 of 16    Date for PT Re-Evaluation 02/16/23    PT Start Time 1345 to 1428  (43 minutes).   Activity Tolerance Patient limited by pain    Behavior During Therapy [facility] for tasks assessed/performed         Past Medical History:  Diagnosis Date   Arthritis    back, fingers with joint pain and swelling.  chronic back pain   Cataract    Chronic back pain    arthritis    Diverticulosis    Elevated cholesterol    takes Niacin daily and Simvastatin   GERD (gastroesophageal reflux disease) 06/2004   non-specific gastritis on EGD 06/2004   Headache(784.0)    occasionally   History of colon polyps 2004, 2009   2004:adenomatous. 2009 hyperplastic.    History of hiatal hernia    Hypertension    takes Tenoretic and Lisinopril daily   Mucoid cyst of joint 08/2013   right index finger   Neuromuscular disorder (HCC)    numbness in right leg after knee replacement   Osteopenia 01/2014   T score -1.1 FRAX 14%/0.5%. Stable from prior DEXA   PONV (postoperative nausea and vomiting)    Rotator cuff arthropathy    Left   Seasonal allergies    Sleep apnea    wears CPAP nightly   Stroke (HCC) 1998   x 2 - mild left-sided weakness   Urge incontinence    Uterine prolapse    Past Surgical History:  Procedure Laterality Date   BACK SURGERY     x 2   BELPHAROPTOSIS REPAIR Bilateral 12/14/2017   BUBBLE STUDY  02/16/2021   Procedure: BUBBLE STUDY;  Surgeon: Vesta Mixer, MD;  Location: [facility] ENDOSCOPY;  Service: Cardiovascular;;   CARPAL TUNNEL RELEASE Bilateral    Dr August Saucer   CATARACT EXTRACTION     CATARACT EXTRACTION Bilateral 10/2017   COLONOSCOPY  2004, 2009, 2014   FINGER SURGERY     GUM SURGERY     GYNECOLOGIC CRYOSURGERY     HYSTEROSCOPY WITH  D & C  12/07/2010   with resection of endometrial polyp   JOINT REPLACEMENT     KNEE ARTHROSCOPY Left 2008   lip biopsy     done at MD office Fri 11/22/13   MASS EXCISION Right 08/15/2013   Procedure: RIGHT INDEX EXCISION MASS ;  Surgeon: Tami Ribas, MD;  Location: [facility];  Service: Orthopedics;  Laterality: Right;   NASAL SEPTUM SURGERY     OOPHORECTOMY Right 2000   REVERSE SHOULDER ARTHROPLASTY Left 12/11/2018   REVERSE SHOULDER ARTHROPLASTY Left 12/11/2018   Procedure: LEFT REVERSE SHOULDER ARTHROPLASTY;  Surgeon: Cammy Copa, MD;  Location: [facility] OR;  Service: Orthopedics;  Laterality: Left;   SHOULDER ARTHROSCOPY WITH OPEN ROTATOR CUFF REPAIR AND DISTAL CLAVICLE ACROMINECTOMY Right 11/26/2013   Procedure: RIGHT SHOULDER ARTHROSCOPY WITH MINI OPEN ROTATOR CUFF REPAIR AND DISTAL CLAVICLE RESECTION, SUBACROMIAL DECOMPRESSION, POSSIBLE [facility] PATCH.;  Surgeon: Valeria Batman, MD;  Location: [facility] OR;  Service: Orthopedics;  Laterality: Right;   TEE WITHOUT CARDIOVERSION N/A 02/16/2021   Procedure: TRANSESOPHAGEAL ECHOCARDIOGRAM (TEE);  Surgeon: Elease Hashimoto Deloris Ping, MD;  Location: [facility] ENDOSCOPY;  Service: Cardiovascular;  Laterality: N/A;   TOTAL KNEE ARTHROPLASTY  Right 03/21/2017   TOTAL KNEE ARTHROPLASTY Right 03/21/2017   Procedure: RIGHT TOTAL KNEE ARTHROPLASTY;  Surgeon: Valeria Batman, MD;  Location: [facility] OR;  Service: Orthopedics;  Laterality: Right;   TOTAL KNEE ARTHROPLASTY Left 11/29/2022   Procedure: LEFT TOTAL KNEE ARTHROPLASTY;  Surgeon: Cammy Copa, MD;  Location: [facility] ORS;  Service: Orthopedics;  Laterality: Left;   TUBAL LIGATION     Patient Active Problem List   Diagnosis Date Noted   S/P total knee replacement, left 11/29/2022   Urinary incontinence 03/25/2022   ICH (intracerebral hemorrhage) (HCC) 02/13/2021   Iron deficiency 10/07/2020   Aortic atherosclerosis (HCC) 03/17/2020   Lung nodule seen on imaging study 02/24/2020   OA  (osteoarthritis) of shoulder 12/11/2018   Chronic left shoulder pain 08/23/2018   Arthritis of left knee 11/02/2016   Chronic diarrhea 12/07/2014   Segmental colitis with rectal bleeding (HCC) 09/12/2014   Routine general medical examination at a health care facility 06/05/2014   Overweight 06/13/2011   Hyperlipidemia 02/17/2007   Essential hypertension 01/08/2007   Osteoarthritis 01/08/2007    PCP: Myrlene Broker, MD  REFERRING PROVIDER: Cammy Copa, MD  REFERRING DIAG: S/P total knee replacement, left  THERAPY DIAG:  Total knee replacement status, left  Stiffness of left knee, not elsewhere classified  Gait difficulty  Muscle weakness (generalized)  Unsteadiness on feet  Rationale for Evaluation and Treatment: Rehabilitation  ONSET DATE: 11/29/2022  SUBJECTIVE:   SUBJECTIVE STATEMENT: Pt reports to PT s/p L TKA on 11/29/2022. She reports falling in hospital during her stay which also resulted in her hurting her R wrist; pt wearing a wrist brace upon arrival and using RW with armrest for R UE. Pt has had HHPT coming to visit 2-3 times a week since discharging from the hospital. Pt also reports mild numbness/tingling in the medial/lateral aspects of her L knee, as well as completely down her R leg from the knee down. Pt has hx of R TKA ~6 years ago.  PERTINENT HISTORY: Hx of R TKA (~6 years ago), reverse TSA on LUE, HTN PAIN:  Are you having pain? Yes: NPRS scale: 3/10 now/best, 8/10 at worst Pain location: L knee Aggravating factors: activity, exercise Relieving factors: Ice (polar care), tylenol, pt reports taking oxy at night PRN to help sleep  PRECAUTIONS: None  RED FLAGS: None   WEIGHT BEARING RESTRICTIONS: No  FALLS:  Has patient fallen in last 6 months? Yes. Number of falls 1  LIVING ENVIRONMENT: Lives with: lives with their spouse Lives in: House/apartment Stairs: Yes: Internal: 1 flight of steps (doesn't need to use); External: 2  steps, rail on R going up Has following equipment at home: Single point cane and Chattie Greeson - 4 wheeled  PLOF: Independent  PATIENT GOALS: Get back to being able to take care of/walk dogs, get back to walking without AD  NEXT MD VISIT: TBD  OBJECTIVE:   DIAGNOSTIC FINDINGS:  XR Wrist Complete Right:  PA, oblique, lateral, navicular views of right wrist reviewed.  No  fracture or dislocation noted.  Severe degenerative changes noted of the first [facility] joint with mild degenerative changes of the radiocarpal joint and STT joint.  Mild to moderate degenerative changes of the first MCP and IP joints.   PATIENT SURVEYS:  FOTO 43/54  COGNITION: Overall cognitive status: Within functional limits for tasks assessed     SENSATION: Not formally tested however pt reports numbness down R LE ever since R TKA ~6 years ago  EDEMA:  Circumferential measurements:  - Right: Joint line = 45 cm, 2 inches superior to patella = 47 cm, 2 inches distal to patella = 40.5 cm  - Left: Joint line = 44 cm, 2 inches superior to patella = 49.5 cm, 2 inches distal to patella = 42 cm  POSTURE: flexed trunk   PALPATION: L knee incision site examined, no excess redness or warmth noted; mild swelling still present.   LOWER EXTREMITY ROM/:  R knee AROM: 0-102 degrees (in supine)  L knee AROM: 0-89 degrees (in supine); pt able to achieve 97 degrees active knee flexion in sitting   JOINT MOBILITY ASSESSMENTS:  L Tibiofemoral Joint:   - A/P and P/A glides hypomobile with tight end feel   - Medial/Lateral glides WNL   - IR hypomobile with soft end feel   - ER hypomobile with soft end feel, painful   -Patellar glides me/lat/sup/inf     LOWER EXTREMITY MMT:  MMT Right eval Left eval  Hip flexion 3+ (in supine) 3+ (in supine)  Hip extension 3 3-  Hip abduction 3 3  Hip adduction 3 3  Hip internal rotation    Hip external rotation    Knee flexion 3+   Knee extension    Ankle dorsiflexion    Ankle  plantarflexion    Ankle inversion    Ankle eversion    Knee strength testing deferred on L side secondary to pain  FUNCTIONAL TESTS:  30 seconds chair stand test: 7 reps Timed up and go (TUG): 20 seconds, no AD  BALANCE TESTS: SLS: 5-6 seconds bilaterally, increased instability with L side  10+ seconds tandem stance bilaterally   GAIT: Distance walked: 20 feet during TUG Assistive device utilized: None Level of assistance: Complete Independence Comments: Slow, cautious gait pattern and speed. Slow turns.    TODAY'S TREATMENT:                                                                                                                              DATE: 01/19/2023     Subjective: Patient arrives to treatment session with [facility] and R wrist brace. Reports had her MRI yesterday and waiting for results. Continues to feel like she is maintaining at current state with her knee.   Therex:  Nustep L3 (seat position 6-4) x 10 mins for B LE strengthening and endurance, interval history taken during.  Cues to keep SPM >60   TRX squat 3 x 10 with B UE support    Fwd step ups 2 x 15 leading with each LE and single UE support   Sidestepping in the // bars with blue TB around knees 2 x 4 laps   Fwd/bwd walking in // bars with blue TB around knees 2 x 4 laps   Not today:  Standing knee flexion stretch on 2nd step, BUE support, 10 reps of 5-10 second holds into L knee flexion  Sitting L knee flexion passive stretch  holds with MET into extension: x10 reps - increased pain with end-range knee flexion - 110 degrees L knee flexion AROM at end of set  Manual Therapy:   Supine L A/P  tibiofemoral glides: grades 2-3 x 3 minutes Supine STM to L distal quad/ hamstring.  Instructed in scar massage with use of Vitamin E lotion.  Moderate crepitus under scar with cross-friction massage.  Improved scar appearance after massage.  Supine L knee flexion 110 deg. (AROM)- pain tolerable.  L knee 113 deg.  With marked increase in pain.    PATIENT EDUCATION:  Education details: Pt educated on HEP completion and frequency, educated to avoid using lotion at incision site until scarring/stitches have healed to avoid infection Person educated: Patient Education method: Medical illustrator Education comprehension: verbalized understanding  HOME EXERCISE PROGRAM: Access Code: [email or web address] URL: [email or web address] Date: 12/29/2022 Prepared by: Janet Berlin  Exercises - Supine Bridge with Resistance Band  - 1 x daily - 7 x weekly - 3 sets - 10 reps - Seated Long Arc Quad with Ankle Weight  - 1 x daily - 7 x weekly - 3 sets - 10 reps - Supine Hip Flexion with Ankle Weight  - 1 x daily - 7 x weekly - 3 sets - 10 reps - Prone Hip Extension with Ankle Weight  - 1 x daily - 7 x weekly - 3 sets - 10 reps - Sit to Stand with Resistance Around Legs  - 1 x daily - 7 x weekly - 3 sets - 10 reps  ASSESSMENT:  CLINICAL IMPRESSION:   Patient arrives to treatment session with [facility] and ready to participate. Session focused on B hip/knee strengthening with resistance. Tolerated treatment session well. Patient reports that she is participating in her HEP. Pt will benefit from continued PT services upon discharge to safely address deficits listed in patient problem list for decreased caregiver assistance and eventual return to PLOF.   OBJECTIVE IMPAIRMENTS: decreased activity tolerance, decreased balance, decreased endurance, difficulty walking, decreased ROM, decreased strength, and pain.   ACTIVITY LIMITATIONS: squatting, stairs, transfers, and locomotion level  PARTICIPATION LIMITATIONS: cleaning, laundry, community activity, and yard work  PERSONAL FACTORS: Age and 3+ comorbidities: Hx of R TKA, hx of reverse TSA on L UE, R wrist pain  are also affecting patient's functional outcome.   REHAB POTENTIAL: Good  CLINICAL DECISION MAKING: Stable/uncomplicated  EVALUATION COMPLEXITY:  Low   GOALS:  SHORT TERM GOALS: Target date: 01/19/2023 Pt will be independent and compliant with HEP in order to increase L knee flexion AROM to at least 110 degrees needed for improved functional mobility Baseline: 97 degrees AROM in sitting position Goal status: INITIAL  2.  Pt will report subjective improvement in L knee pain with exercise on NPS scale by at least 3 points in order to show clinically significant improvement in pain levels. Baseline: 8/10 Goal status: INITIAL   LONG TERM GOALS: Target date: 02/16/2023  Pt will improve FOTO to at least 54 in order to show improved pain and function with ADL's and functional mobility. Baseline: Initial 43 Goal status: INITIAL  2.  Pt will be able to improve rep count during 30 second sit-to-stand test to at least 14 in order to demonstrate improved functional independence. Baseline: 7 reps  Goal status: INITIAL  3.  Pt will decrease TUG time to at least 11 seconds in order to be below cutoff for fall-risk. Baseline: 20 seconds, no AD Goal status: INITIAL  4.  Pt will demonstrate  at least 4/5 MMT scores for hip strength bilaterally in order to show improvement in LE strength needed for improved mobility and independence. Baseline: See above for initial MMT scores Goal status: INITIAL  5.  Pt will improve distance by at least 50 meters with LRAD in order to show improved cardiovascular and LE endurance. Baseline: 776 feet (12/27/2022) Goal status: INITIAL    PLAN:  PT FREQUENCY: 2x/week  PT DURATION: 8 weeks  PLANNED INTERVENTIONS: Therapeutic exercises, Therapeutic activity, Neuromuscular re-education, Balance training, Gait training, Patient/Family education, Joint mobilization, Stair training, Cryotherapy, Moist heat, and Manual therapy  PLAN FOR NEXT SESSION: Modify/progress HEP as tolerated, progress LE strengthening exercises, progress L knee flexion AROM/PROM as pain tolerates.   Maylon Peppers, PT,  DPT Physical Therapist - [facility] 01/19/23, 2:46 PM

## 2023-01-24 ENCOUNTER — Encounter: Payer: Self-pay | Admitting: Physical Therapy

## 2023-01-24 ENCOUNTER — Ambulatory Visit: Payer: Medicare Other

## 2023-01-24 DIAGNOSIS — R269 Unspecified abnormalities of gait and mobility: Secondary | ICD-10-CM | POA: Diagnosis not present

## 2023-01-24 DIAGNOSIS — M25562 Pain in left knee: Secondary | ICD-10-CM | POA: Diagnosis not present

## 2023-01-24 DIAGNOSIS — M6281 Muscle weakness (generalized): Secondary | ICD-10-CM | POA: Diagnosis not present

## 2023-01-24 DIAGNOSIS — M25662 Stiffness of left knee, not elsewhere classified: Secondary | ICD-10-CM | POA: Diagnosis not present

## 2023-01-24 DIAGNOSIS — Z96652 Presence of left artificial knee joint: Secondary | ICD-10-CM

## 2023-01-24 DIAGNOSIS — R2681 Unsteadiness on feet: Secondary | ICD-10-CM | POA: Diagnosis not present

## 2023-01-24 NOTE — Therapy (Signed)
OUTPATIENT PHYSICAL THERAPY LOWER EXTREMITY TREATMENT/PROGRESS NOTE 12/22/2022 - 01/24/2023   Patient Name: [name] MRN: [id number] DOB:March 22, 1951, 72 y.o., female Today's Date: 01/24/2023    PT End of Session - 01/24/23 1343     Visit Number 10    Number of Visits 16    Date for PT Re-Evaluation 02/16/23    PT Start Time 1345    PT Stop Time 1427    PT Time Calculation (min) 42 min    Activity Tolerance Patient tolerated treatment well    Behavior During Therapy WFL for tasks assessed/performed              Past Medical History:  Diagnosis Date   Arthritis    back, fingers with joint pain and swelling.  chronic back pain   Cataract    Chronic back pain    arthritis    Diverticulosis    Elevated cholesterol    takes Niacin daily and Simvastatin   GERD (gastroesophageal reflux disease) 06/2004   non-specific gastritis on EGD 06/2004   Headache(784.0)    occasionally   History of colon polyps 2004, 2009   2004:adenomatous. 2009 hyperplastic.    History of hiatal hernia    Hypertension    takes Tenoretic and Lisinopril daily   Mucoid cyst of joint 08/2013   right index finger   Neuromuscular disorder (HCC)    numbness in right leg after knee replacement   Osteopenia 01/2014   T score -1.1 FRAX 14%/0.5%. Stable from prior DEXA   PONV (postoperative nausea and vomiting)    Rotator cuff arthropathy    Left   Seasonal allergies    Sleep apnea    wears CPAP nightly   Stroke (HCC) 1998   x 2 - mild left-sided weakness   Urge incontinence    Uterine prolapse    Past Surgical History:  Procedure Laterality Date   BACK SURGERY     x 2   BELPHAROPTOSIS REPAIR Bilateral 12/14/2017   BUBBLE STUDY  02/16/2021   Procedure: BUBBLE STUDY;  Surgeon: Vesta Mixer, MD;  Location: [facility] ENDOSCOPY;  Service: Cardiovascular;;   CARPAL TUNNEL RELEASE Bilateral    Dr August Saucer   CATARACT EXTRACTION     CATARACT EXTRACTION Bilateral 10/2017   COLONOSCOPY  2004, 2009,  2014   FINGER SURGERY     GUM SURGERY     GYNECOLOGIC CRYOSURGERY     HYSTEROSCOPY WITH D & C  12/07/2010   with resection of endometrial polyp   JOINT REPLACEMENT     KNEE ARTHROSCOPY Left 2008   lip biopsy     done at MD office Fri 11/22/13   MASS EXCISION Right 08/15/2013   Procedure: RIGHT INDEX EXCISION MASS ;  Surgeon: Tami Ribas, MD;  Location: [facility];  Service: Orthopedics;  Laterality: Right;   NASAL SEPTUM SURGERY     OOPHORECTOMY Right 2000   REVERSE SHOULDER ARTHROPLASTY Left 12/11/2018   REVERSE SHOULDER ARTHROPLASTY Left 12/11/2018   Procedure: LEFT REVERSE SHOULDER ARTHROPLASTY;  Surgeon: Cammy Copa, MD;  Location: [facility] OR;  Service: Orthopedics;  Laterality: Left;   SHOULDER ARTHROSCOPY WITH OPEN ROTATOR CUFF REPAIR AND DISTAL CLAVICLE ACROMINECTOMY Right 11/26/2013   Procedure: RIGHT SHOULDER ARTHROSCOPY WITH MINI OPEN ROTATOR CUFF REPAIR AND DISTAL CLAVICLE RESECTION, SUBACROMIAL DECOMPRESSION, POSSIBLE [facility] PATCH.;  Surgeon: Valeria Batman, MD;  Location: [facility] OR;  Service: Orthopedics;  Laterality: Right;   TEE WITHOUT CARDIOVERSION N/A 02/16/2021   Procedure: TRANSESOPHAGEAL  ECHOCARDIOGRAM (TEE);  Surgeon: Elease Hashimoto Deloris Ping, MD;  Location: [facility] ENDOSCOPY;  Service: Cardiovascular;  Laterality: N/A;   TOTAL KNEE ARTHROPLASTY Right 03/21/2017   TOTAL KNEE ARTHROPLASTY Right 03/21/2017   Procedure: RIGHT TOTAL KNEE ARTHROPLASTY;  Surgeon: Valeria Batman, MD;  Location: [facility] OR;  Service: Orthopedics;  Laterality: Right;   TOTAL KNEE ARTHROPLASTY Left 11/29/2022   Procedure: LEFT TOTAL KNEE ARTHROPLASTY;  Surgeon: Cammy Copa, MD;  Location: [facility] ORS;  Service: Orthopedics;  Laterality: Left;   TUBAL LIGATION     Patient Active Problem List   Diagnosis Date Noted   S/P total knee replacement, left 11/29/2022   Urinary incontinence 03/25/2022   ICH (intracerebral hemorrhage) (HCC) 02/13/2021   Iron deficiency 10/07/2020   Aortic  atherosclerosis (HCC) 03/17/2020   Lung nodule seen on imaging study 02/24/2020   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   Chronic left shoulder pain 08/23/2018   Arthritis of left knee 11/02/2016   Chronic diarrhea 12/07/2014   Segmental colitis with rectal bleeding (HCC) 09/12/2014   Routine general medical examination at a health care facility 06/05/2014   Overweight 06/13/2011   Hyperlipidemia 02/17/2007   Essential hypertension 01/08/2007   Osteoarthritis 01/08/2007    PCP: Myrlene Broker, MD  REFERRING PROVIDER: Cammy Copa, MD  REFERRING DIAG: S/P total knee replacement, left  THERAPY DIAG:  Total knee replacement status, left  Stiffness of left knee, not elsewhere classified  Gait difficulty  Muscle weakness (generalized)  Unsteadiness on feet  Acute pain of left knee  Rationale for Evaluation and Treatment: Rehabilitation  ONSET DATE: 11/29/2022  SUBJECTIVE:   SUBJECTIVE STATEMENT: Pt reports to PT s/p L TKA on 11/29/2022. She reports falling in hospital during her stay which also resulted in her hurting her R wrist; pt wearing a wrist brace upon arrival and using RW with armrest for R UE. Pt has had HHPT coming to visit 2-3 times a week since discharging from the hospital. Pt also reports mild numbness/tingling in the medial/lateral aspects of her L knee, as well as completely down her R leg from the knee down. Pt has hx of R TKA ~6 years ago.  PERTINENT HISTORY: Hx of R TKA (~6 years ago), reverse TSA on LUE, HTN PAIN:  Are you having pain? Yes: NPRS scale: 3/10 now/best, 8/10 at worst Pain location: L knee Aggravating factors: activity, exercise Relieving factors: Ice (polar care), tylenol, pt reports taking oxy at night PRN to help sleep  PRECAUTIONS: None  RED FLAGS: None   WEIGHT BEARING RESTRICTIONS: No  FALLS:  Has patient fallen in last 6 months? Yes. Number of falls 1  LIVING ENVIRONMENT: Lives with: lives with their  spouse Lives in: House/apartment Stairs: Yes: Internal: 1 flight of steps (doesn't need to use); External: 2 steps, rail on R going up Has following equipment at home: Single point cane and Walker - 4 wheeled  PLOF: Independent  PATIENT GOALS: Get back to being able to take care of/walk dogs, get back to walking without AD  NEXT MD VISIT: TBD  OBJECTIVE:   DIAGNOSTIC FINDINGS:  XR Wrist Complete Right:  PA, oblique, lateral, navicular views of right wrist reviewed.  No  fracture or dislocation noted.  Severe degenerative changes noted of the first [facility] joint with mild degenerative changes of the radiocarpal joint and STT joint.  Mild to moderate degenerative changes of the first MCP and IP joints.   PATIENT SURVEYS:  FOTO 43/54  COGNITION: Overall cognitive status: Within  functional limits for tasks assessed     SENSATION: Not formally tested however pt reports numbness down R LE ever since R TKA ~6 years ago  EDEMA:  Circumferential measurements:  - Right: Joint line = 45 cm, 2 inches superior to patella = 47 cm, 2 inches distal to patella = 40.5 cm  - Left: Joint line = 44 cm, 2 inches superior to patella = 49.5 cm, 2 inches distal to patella = 42 cm  POSTURE: flexed trunk   PALPATION: L knee incision site examined, no excess redness or warmth noted; mild swelling still present.   LOWER EXTREMITY ROM/:  R knee AROM: 0-102 degrees (in supine)  L knee AROM: 0-89 degrees (in supine); pt able to achieve 97 degrees active knee flexion in sitting   JOINT MOBILITY ASSESSMENTS:  L Tibiofemoral Joint:   - A/P and P/A glides hypomobile with tight end feel   - Medial/Lateral glides WNL   - IR hypomobile with soft end feel   - ER hypomobile with soft end feel, painful   -Patellar glides me/lat/sup/inf     LOWER EXTREMITY MMT:  MMT Right eval Left eval  Hip flexion 3+ (in supine) 3+ (in supine)  Hip extension 3 3-  Hip abduction 3 3  Hip adduction 3 3  Hip internal  rotation    Hip external rotation    Knee flexion 3+   Knee extension    Ankle dorsiflexion    Ankle plantarflexion    Ankle inversion    Ankle eversion    Knee strength testing deferred on L side secondary to pain  FUNCTIONAL TESTS:  30 seconds chair stand test: 7 reps Timed up and go (TUG): 20 seconds, no AD  BALANCE TESTS: SLS: 5-6 seconds bilaterally, increased instability with L side  10+ seconds tandem stance bilaterally   GAIT: Distance walked: 20 feet during TUG Assistive device utilized: None Level of assistance: Complete Independence Comments: Slow, cautious gait pattern and speed. Slow turns.    TODAY'S TREATMENT:                                                                                                                              DATE: 01/24/2023     Subjective: Patient arrives to treatment session with [facility] and R wrist brace. Still waiting on MRI results for R wrist.  Continues to feel like she is maintaining at current state with her knee. Pt reports 2/10 L knee pain today; she has been busy so she hasn't been able to do her HEP as much.  Therex:  FOTO: 47 today  : 753 feet with SPC, HR 103 bpm, O2 97%  30 second sit to stand: 9 reps, no use of UE's   TUG: 11.51 seconds with SPC, 12.8 seconds with no AD  LOWER EXTREMITY MMT:    MMT Right eval Left eval  Hip flexion 4 4-  Hip extension 3+ 3+  Hip abduction 4-  4- ! Lateral knee  Hip adduction    Hip internal rotation 4 4  Hip external rotation 4 4! Medial knee  Knee flexion 4+ 4  Knee extension 5 4+  Ankle dorsiflexion 5 5  Ankle plantarflexion    Ankle inversion    Ankle eversion     (Blank rows = not tested)   [place]: 2x10  Sit-to-stand with single foot on 6-inch step: 1x8 reps each side    Not today:  Nustep L3 (seat position 6-4) x 10 mins for B LE strengthening and endurance, interval history taken during.  Cues to keep SPM >60  Standing knee flexion stretch on  2nd step, BUE support, 10 reps of 5-10 second holds into L knee flexion  Sitting L knee flexion passive stretch holds with MET into extension: x10 reps - increased pain with end-range knee flexion - 110 degrees L knee flexion AROM at end of set  Manual Therapy:   Supine L A/P  tibiofemoral glides: grades 2-3 x 3 minutes Supine STM to L distal quad/ hamstring.  Instructed in scar massage with use of Vitamin E lotion.  Moderate crepitus under scar with cross-friction massage.  Improved scar appearance after massage.  Supine L knee flexion 110 deg. (AROM)- pain tolerable.  L knee 113 deg. With marked increase in pain.    PATIENT EDUCATION:  Education details: Pt educated on HEP completion and frequency, educated to avoid using lotion at incision site until scarring/stitches have healed to avoid infection Person educated: Patient Education method: Medical illustrator Education comprehension: verbalized understanding  HOME EXERCISE PROGRAM: Access Code: [email or web address] URL: [email or web address] Date: 12/29/2022 Prepared by: Janet Berlin  Exercises - Supine Bridge with Resistance Band  - 1 x daily - 7 x weekly - 3 sets - 10 reps - Seated Long Arc Quad with Ankle Weight  - 1 x daily - 7 x weekly - 3 sets - 10 reps - Supine Hip Flexion with Ankle Weight  - 1 x daily - 7 x weekly - 3 sets - 10 reps - Prone Hip Extension with Ankle Weight  - 1 x daily - 7 x weekly - 3 sets - 10 reps - Sit to Stand with Resistance Around Legs  - 1 x daily - 7 x weekly - 3 sets - 10 reps  ASSESSMENT:  CLINICAL IMPRESSION:   Patient arrives to treatment session with SPC and mild L knee pain (2/10). Progress note performed today with reassessment of outcome measures and pt's LE strength. Pt demonstrated improvement in LE MMT scores, particularly hip flexors and hip abductors. Pt continues to demonstrate the most weakness with hip extensors bilaterally. Pt demonstrated great improvement with TUG time  compared to initial evaluation and was able to perform it with no AD. Mild improvement noted in 30 second sit-to-stand. Pt's distance on did not improve, however, pt was able to perform with [facility] today as opposed to with a RW on the initial evaluation. The rest of the session consisted of exercises aimed to improve pt's hip extensor strength. Pt tolerated all exercises and outcome measures well today with no increased L knee pain, just reports of increased LE and cardiovascular fatigue. Pt's LTG's updated to reflect progress. Pt will benefit from continued PT services upon discharge to safely address deficits listed in patient problem list for decreased caregiver assistance and eventual return to PLOF.   OBJECTIVE IMPAIRMENTS: decreased activity tolerance, decreased balance, decreased endurance, difficulty walking, decreased ROM, decreased strength, and pain.  ACTIVITY LIMITATIONS: squatting, stairs, transfers, and locomotion level  PARTICIPATION LIMITATIONS: cleaning, laundry, community activity, and yard work  PERSONAL FACTORS: Age and 3+ comorbidities: Hx of R TKA, hx of reverse TSA on L UE, R wrist pain  are also affecting patient's functional outcome.   REHAB POTENTIAL: Good  CLINICAL DECISION MAKING: Stable/uncomplicated  EVALUATION COMPLEXITY: Low   GOALS:  SHORT TERM GOALS: Target date: 01/19/2023 Pt will be independent and compliant with HEP in order to increase L knee flexion AROM to at least 110 degrees needed for improved functional mobility Baseline: 97 degrees AROM in sitting position Goal status: INITIAL  2.  Pt will report subjective improvement in L knee pain with exercise on NPS scale by at least 3 points in order to show clinically significant improvement in pain levels. Baseline: 8/10 Goal status: INITIAL   LONG TERM GOALS: Target date: 02/16/2023  Pt will improve FOTO to at least 54 in order to show improved pain and function with ADL's and functional  mobility. Baseline: Initial 43, 10/15: 47 Goal status: Not met, progressing   2.  Pt will be able to improve rep count during 30 second sit-to-stand test to at least 14 in order to demonstrate improved functional independence. Baseline: 7 reps, 10/15: 9 reps Goal status: Not met, progressing  3.  Pt will decrease TUG time to at least 11 seconds in order to be below cutoff for fall-risk. Baseline: 20 seconds, no AD; 10/15: 12.8 seconds with no AD Goal status: Not met, progressing  4.  Pt will demonstrate at least 4/5 MMT scores for hip strength bilaterally in order to show improvement in LE strength needed for improved mobility and independence. Baseline: See above for initial MMT scores, 10/15: see for updated MMT scores Goal status: Not met, progressing  5.  Pt will improve distance by at least 50 meters with LRAD in order to show improved cardiovascular and LE endurance. Baseline: 9/17: 776 feet (with RW), 10/15: 753 feet (with SPC) Goal status: Not met, progressing   PLAN:  PT FREQUENCY: 2x/week  PT DURATION: 8 weeks  PLANNED INTERVENTIONS: Therapeutic exercises, Therapeutic activity, Neuromuscular re-education, Balance training, Gait training, Patient/Family education, Joint mobilization, Stair training, Cryotherapy, Moist heat, and Manual therapy  PLAN FOR NEXT SESSION: Progress LE strengthening exercises, progress L knee flexion AROM/PROM as pain tolerates, modify/progress HEP   Zareah Hunzeker, SPT  Maylon Peppers, PT, DPT Physical Therapist - [place]  [facility] 01/24/23, 3:35 PM

## 2023-01-26 ENCOUNTER — Encounter: Payer: Self-pay | Admitting: Physical Therapy

## 2023-01-26 ENCOUNTER — Ambulatory Visit: Payer: Medicare Other

## 2023-01-26 DIAGNOSIS — M25562 Pain in left knee: Secondary | ICD-10-CM | POA: Diagnosis not present

## 2023-01-26 DIAGNOSIS — R2681 Unsteadiness on feet: Secondary | ICD-10-CM | POA: Diagnosis not present

## 2023-01-26 DIAGNOSIS — Z96652 Presence of left artificial knee joint: Secondary | ICD-10-CM

## 2023-01-26 DIAGNOSIS — M6281 Muscle weakness (generalized): Secondary | ICD-10-CM | POA: Diagnosis not present

## 2023-01-26 DIAGNOSIS — M25662 Stiffness of left knee, not elsewhere classified: Secondary | ICD-10-CM | POA: Diagnosis not present

## 2023-01-26 DIAGNOSIS — R269 Unspecified abnormalities of gait and mobility: Secondary | ICD-10-CM | POA: Diagnosis not present

## 2023-01-26 NOTE — Therapy (Signed)
OUTPATIENT PHYSICAL THERAPY LOWER EXTREMITY TREATMENT  Patient Name: [name] MRN: [id number] DOB:05-12-1950, 72 y.o., female Today's Date: 01/26/2023    PT End of Session - 01/26/23 1346     Visit Number 11    Number of Visits 16    Date for PT Re-Evaluation 02/16/23    PT Start Time 1346    PT Stop Time 1429    PT Time Calculation (min) 43 min    Activity Tolerance Patient tolerated treatment well    Behavior During Therapy WFL for tasks assessed/performed              Past Medical History:  Diagnosis Date   Arthritis    back, fingers with joint pain and swelling.  chronic back pain   Cataract    Chronic back pain    arthritis    Diverticulosis    Elevated cholesterol    takes Niacin daily and Simvastatin   GERD (gastroesophageal reflux disease) 06/2004   non-specific gastritis on EGD 06/2004   Headache(784.0)    occasionally   History of colon polyps 2004, 2009   2004:adenomatous. 2009 hyperplastic.    History of hiatal hernia    Hypertension    takes Tenoretic and Lisinopril daily   Mucoid cyst of joint 08/2013   right index finger   Neuromuscular disorder (HCC)    numbness in right leg after knee replacement   Osteopenia 01/2014   T score -1.1 FRAX 14%/0.5%. Stable from prior DEXA   PONV (postoperative nausea and vomiting)    Rotator cuff arthropathy    Left   Seasonal allergies    Sleep apnea    wears CPAP nightly   Stroke (HCC) 1998   x 2 - mild left-sided weakness   Urge incontinence    Uterine prolapse    Past Surgical History:  Procedure Laterality Date   BACK SURGERY     x 2   BELPHAROPTOSIS REPAIR Bilateral 12/14/2017   BUBBLE STUDY  02/16/2021   Procedure: BUBBLE STUDY;  Surgeon: Vesta Mixer, MD;  Location: [facility] ENDOSCOPY;  Service: Cardiovascular;;   CARPAL TUNNEL RELEASE Bilateral    Dr August Saucer   CATARACT EXTRACTION     CATARACT EXTRACTION Bilateral 10/2017   COLONOSCOPY  2004, 2009, 2014   FINGER SURGERY     GUM SURGERY      GYNECOLOGIC CRYOSURGERY     HYSTEROSCOPY WITH D & C  12/07/2010   with resection of endometrial polyp   JOINT REPLACEMENT     KNEE ARTHROSCOPY Left 2008   lip biopsy     done at MD office Fri 11/22/13   MASS EXCISION Right 08/15/2013   Procedure: RIGHT INDEX EXCISION MASS ;  Surgeon: Tami Ribas, MD;  Location: [facility];  Service: Orthopedics;  Laterality: Right;   NASAL SEPTUM SURGERY     OOPHORECTOMY Right 2000   REVERSE SHOULDER ARTHROPLASTY Left 12/11/2018   REVERSE SHOULDER ARTHROPLASTY Left 12/11/2018   Procedure: LEFT REVERSE SHOULDER ARTHROPLASTY;  Surgeon: Cammy Copa, MD;  Location: [facility] OR;  Service: Orthopedics;  Laterality: Left;   SHOULDER ARTHROSCOPY WITH OPEN ROTATOR CUFF REPAIR AND DISTAL CLAVICLE ACROMINECTOMY Right 11/26/2013   Procedure: RIGHT SHOULDER ARTHROSCOPY WITH MINI OPEN ROTATOR CUFF REPAIR AND DISTAL CLAVICLE RESECTION, SUBACROMIAL DECOMPRESSION, POSSIBLE [facility] PATCH.;  Surgeon: Valeria Batman, MD;  Location: [facility] OR;  Service: Orthopedics;  Laterality: Right;   TEE WITHOUT CARDIOVERSION N/A 02/16/2021   Procedure: TRANSESOPHAGEAL ECHOCARDIOGRAM (TEE);  Surgeon: Elease Hashimoto,  Deloris Ping, MD;  Location: [facility] ENDOSCOPY;  Service: Cardiovascular;  Laterality: N/A;   TOTAL KNEE ARTHROPLASTY Right 03/21/2017   TOTAL KNEE ARTHROPLASTY Right 03/21/2017   Procedure: RIGHT TOTAL KNEE ARTHROPLASTY;  Surgeon: Valeria Batman, MD;  Location: [facility] OR;  Service: Orthopedics;  Laterality: Right;   TOTAL KNEE ARTHROPLASTY Left 11/29/2022   Procedure: LEFT TOTAL KNEE ARTHROPLASTY;  Surgeon: Cammy Copa, MD;  Location: [facility] ORS;  Service: Orthopedics;  Laterality: Left;   TUBAL LIGATION     Patient Active Problem List   Diagnosis Date Noted   S/P total knee replacement, left 11/29/2022   Urinary incontinence 03/25/2022   ICH (intracerebral hemorrhage) (HCC) 02/13/2021   Iron deficiency 10/07/2020   Aortic atherosclerosis (HCC) 03/17/2020   Lung  nodule seen on imaging study 02/24/2020   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   Chronic left shoulder pain 08/23/2018   Arthritis of left knee 11/02/2016   Chronic diarrhea 12/07/2014   Segmental colitis with rectal bleeding (HCC) 09/12/2014   Routine general medical examination at a health care facility 06/05/2014   Overweight 06/13/2011   Hyperlipidemia 02/17/2007   Essential hypertension 01/08/2007   Osteoarthritis 01/08/2007    PCP: Myrlene Broker, MD  REFERRING PROVIDER: Cammy Copa, MD  REFERRING DIAG: S/P total knee replacement, left  THERAPY DIAG:  No diagnosis found.  Rationale for Evaluation and Treatment: Rehabilitation  ONSET DATE: 11/29/2022  SUBJECTIVE:   SUBJECTIVE STATEMENT: Pt reports to PT s/p L TKA on 11/29/2022. She reports falling in hospital during her stay which also resulted in her hurting her R wrist; pt wearing a wrist brace upon arrival and using RW with armrest for R UE. Pt has had HHPT coming to visit 2-3 times a week since discharging from the hospital. Pt also reports mild numbness/tingling in the medial/lateral aspects of her L knee, as well as completely down her R leg from the knee down. Pt has hx of R TKA ~6 years ago.  PERTINENT HISTORY: Hx of R TKA (~6 years ago), reverse TSA on LUE, HTN PAIN:  Are you having pain? Yes: NPRS scale: 3/10 now/best, 8/10 at worst Pain location: L knee Aggravating factors: activity, exercise Relieving factors: Ice (polar care), tylenol, pt reports taking oxy at night PRN to help sleep  PRECAUTIONS: None  RED FLAGS: None   WEIGHT BEARING RESTRICTIONS: No  FALLS:  Has patient fallen in last 6 months? Yes. Number of falls 1  LIVING ENVIRONMENT: Lives with: lives with their spouse Lives in: House/apartment Stairs: Yes: Internal: 1 flight of steps (doesn't need to use); External: 2 steps, rail on R going up Has following equipment at home: Single point cane and Kahdijah Errickson - 4  wheeled  PLOF: Independent  PATIENT GOALS: Get back to being able to take care of/walk dogs, get back to walking without AD  NEXT MD VISIT: TBD  OBJECTIVE:   DIAGNOSTIC FINDINGS:  XR Wrist Complete Right:  PA, oblique, lateral, navicular views of right wrist reviewed.  No  fracture or dislocation noted.  Severe degenerative changes noted of the first [facility] joint with mild degenerative changes of the radiocarpal joint and STT joint.  Mild to moderate degenerative changes of the first MCP and IP joints.   PATIENT SURVEYS:  FOTO 43/54  COGNITION: Overall cognitive status: Within functional limits for tasks assessed     SENSATION: Not formally tested however pt reports numbness down R LE ever since R TKA ~6 years ago  EDEMA:  Circumferential measurements:  -  Right: Joint line = 45 cm, 2 inches superior to patella = 47 cm, 2 inches distal to patella = 40.5 cm  - Left: Joint line = 44 cm, 2 inches superior to patella = 49.5 cm, 2 inches distal to patella = 42 cm  POSTURE: flexed trunk   PALPATION: L knee incision site examined, no excess redness or warmth noted; mild swelling still present.   LOWER EXTREMITY ROM/:  R knee AROM: 0-102 degrees (in supine)  L knee AROM: 0-89 degrees (in supine); pt able to achieve 97 degrees active knee flexion in sitting   JOINT MOBILITY ASSESSMENTS:  L Tibiofemoral Joint:   - A/P and P/A glides hypomobile with tight end feel   - Medial/Lateral glides WNL   - IR hypomobile with soft end feel   - ER hypomobile with soft end feel, painful   -Patellar glides me/lat/sup/inf     LOWER EXTREMITY MMT:  MMT Right eval Left eval  Hip flexion 3+ (in supine) 3+ (in supine)  Hip extension 3 3-  Hip abduction 3 3  Hip adduction 3 3  Hip internal rotation    Hip external rotation    Knee flexion 3+   Knee extension    Ankle dorsiflexion    Ankle plantarflexion    Ankle inversion    Ankle eversion    Knee strength testing deferred on L side  secondary to pain  FUNCTIONAL TESTS:  30 seconds chair stand test: 7 reps Timed up and go (TUG): 20 seconds, no AD  BALANCE TESTS: SLS: 5-6 seconds bilaterally, increased instability with L side  10+ seconds tandem stance bilaterally   GAIT: Distance walked: 20 feet during TUG Assistive device utilized: None Level of assistance: Complete Independence Comments: Slow, cautious gait pattern and speed. Slow turns.    TODAY'S TREATMENT:                                                                                                                              DATE: 01/26/2023     Subjective: Patient arrives to treatment session with [facility] and R wrist brace. Still waiting on MRI results for R wrist.  She continues to be busy and unable to complete HEP regularly. Reports she has started walking around the house without the [facility].  Therex:  Nustep L3 (seat position 6-4) x 10 mins for B LE strengthening and endurance, interval history taken during.  Cues to keep SPM >60   TRX squats 3 x 10   High marching in // bars x 3 laps   Standing hip extension 3 x 10 3# AW  Standing hamstring curls 3 x 10 3# AW   Standing hip abduction 3 x 10 3# AW   Standing heel/toe raises 3 x 10 3# AW each direction    Not today:  Sitting L knee flexion passive stretch holds with MET into extension: x10 reps - increased pain with end-range knee flexion - 110 degrees  L knee flexion AROM at end of set  Manual Therapy:   Supine L A/P  tibiofemoral glides: grades 2-3 x 3 minutes Supine STM to L distal quad/ hamstring.  Instructed in scar massage with use of Vitamin E lotion.  Moderate crepitus under scar with cross-friction massage.  Improved scar appearance after massage.  Supine L knee flexion 110 deg. (AROM)- pain tolerable.  L knee 113 deg. With marked increase in pain.    PATIENT EDUCATION:  Education details: Pt educated on HEP completion and frequency, educated to avoid using lotion at incision site  until scarring/stitches have healed to avoid infection Person educated: Patient Education method: Medical illustrator Education comprehension: verbalized understanding  HOME EXERCISE PROGRAM: Access Code: [email or web address] URL: [email or web address] Date: 12/29/2022 Prepared by: Janet Berlin  Exercises - Supine Bridge with Resistance Band  - 1 x daily - 7 x weekly - 3 sets - 10 reps - Seated Long Arc Quad with Ankle Weight  - 1 x daily - 7 x weekly - 3 sets - 10 reps - Supine Hip Flexion with Ankle Weight  - 1 x daily - 7 x weekly - 3 sets - 10 reps - Prone Hip Extension with Ankle Weight  - 1 x daily - 7 x weekly - 3 sets - 10 reps - Sit to Stand with Resistance Around Legs  - 1 x daily - 7 x weekly - 3 sets - 10 reps  ASSESSMENT:  CLINICAL IMPRESSION:    Patient arrives to treatment session with SPC in hand and motivated to participate. Ambulated during session with no AD. Session focused on LE strengthening with emphasis on extensor strengthening. Tolerated treatment session well with mild complaints of fatigue at end of session. Pt will benefit from continued PT services upon discharge to safely address deficits listed in patient problem list for decreased caregiver assistance and eventual return to PLOF.   OBJECTIVE IMPAIRMENTS: decreased activity tolerance, decreased balance, decreased endurance, difficulty walking, decreased ROM, decreased strength, and pain.   ACTIVITY LIMITATIONS: squatting, stairs, transfers, and locomotion level  PARTICIPATION LIMITATIONS: cleaning, laundry, community activity, and yard work  PERSONAL FACTORS: Age and 3+ comorbidities: Hx of R TKA, hx of reverse TSA on L UE, R wrist pain  are also affecting patient's functional outcome.   REHAB POTENTIAL: Good  CLINICAL DECISION MAKING: Stable/uncomplicated  EVALUATION COMPLEXITY: Low   GOALS:  SHORT TERM GOALS: Target date: 01/19/2023 Pt will be independent and compliant with HEP in  order to increase L knee flexion AROM to at least 110 degrees needed for improved functional mobility Baseline: 97 degrees AROM in sitting position Goal status: INITIAL  2.  Pt will report subjective improvement in L knee pain with exercise on NPS scale by at least 3 points in order to show clinically significant improvement in pain levels. Baseline: 8/10 Goal status: INITIAL   LONG TERM GOALS: Target date: 02/16/2023  Pt will improve FOTO to at least 54 in order to show improved pain and function with ADL's and functional mobility. Baseline: Initial 43, 10/15: 47 Goal status: Not met, progressing   2.  Pt will be able to improve rep count during 30 second sit-to-stand test to at least 14 in order to demonstrate improved functional independence. Baseline: 7 reps, 10/15: 9 reps Goal status: Not met, progressing  3.  Pt will decrease TUG time to at least 11 seconds in order to be below cutoff for fall-risk. Baseline: 20 seconds, no AD; 10/15: 12.8 seconds with no AD  Goal status: Not met, progressing  4.  Pt will demonstrate at least 4/5 MMT scores for hip strength bilaterally in order to show improvement in LE strength needed for improved mobility and independence. Baseline: See above for initial MMT scores, 10/15: see for updated MMT scores Goal status: Not met, progressing  5.  Pt will improve distance by at least 50 meters with LRAD in order to show improved cardiovascular and LE endurance. Baseline: 9/17: 776 feet (with RW), 10/15: 753 feet (with SPC) Goal status: Not met, progressing   PLAN:  PT FREQUENCY: 2x/week  PT DURATION: 8 weeks  PLANNED INTERVENTIONS: Therapeutic exercises, Therapeutic activity, Neuromuscular re-education, Balance training, Gait training, Patient/Family education, Joint mobilization, Stair training, Cryotherapy, Moist heat, and Manual therapy  PLAN FOR NEXT SESSION: Progress LE strengthening exercises, progress L knee flexion AROM/PROM as pain  tolerates, modify/progress HEP   Maylon Peppers, PT, DPT Physical Therapist - [facility]  [facility] 01/26/23, 1:46 PM

## 2023-01-30 ENCOUNTER — Encounter: Payer: Self-pay | Admitting: Orthopedic Surgery

## 2023-01-30 ENCOUNTER — Ambulatory Visit: Payer: Medicare Other | Admitting: Orthopedic Surgery

## 2023-01-30 DIAGNOSIS — M25531 Pain in right wrist: Secondary | ICD-10-CM | POA: Diagnosis not present

## 2023-01-30 NOTE — Progress Notes (Unsigned)
Office Visit Note   Patient: [name]           Date of Birth: Aug 15, 1950           MRN: [id number] Visit Date: 01/30/2023 Requested by: Myrlene Broker, MD [address] [place],  [place] [address] PCP: Myrlene Broker, MD  Subjective: Chief Complaint  Patient presents with   Other     Scan review    HPI: [name] is a 72 y.o. female who presents to the office reporting right wrist pain.  Since she was last seen she has had an MRI scan.  She did fall about 9 weeks ago in the hospital.  Question was occult fracture.  Scaphoid looked intact and no obvious fracture was seen on plain radiographs.  Patient states that overall her right wrist is slightly better..                ROS: All systems reviewed are negative as they relate to the chief complaint within the history of present illness.  Patient denies fevers or chills.  Assessment & Plan: Visit Diagnoses:  1. Pain in right wrist     Plan: Impression is right wrist pain with apparently healed nondisplaced fracture involving the radial styloid.  Scaphoid without definite fracture although 1 line is seen on the coronal radiographs.  No snuffbox tenderness on exam.  She does have some limited range of motion.  She is 9 weeks out from injury so fracture healing has occurred at this time.  Would like for her to start occupational therapy upstairs for range of motion and strengthening of that right wrist.  Follow-up in 4 weeks for clinical recheck.  Follow-Up Instructions: Return in about 4 weeks (around 02/27/2023).   Orders:  Orders Placed This Encounter  Procedures   Ambulatory referral to Occupational Therapy   No orders of the defined types were placed in this encounter.     Procedures: No procedures performed   Clinical Data: No additional findings.  Objective: Vital Signs: LMP 02/09/2005   Physical Exam:  Constitutional: Patient appears well-developed HEENT:  Head:  Normocephalic Eyes:EOM are normal Neck: Normal range of motion Cardiovascular: Normal rate Pulmonary/chest: Effort normal Neurologic: Patient is alert Skin: Skin is warm Psychiatric: Patient has normal mood and affect  Ortho Exam: Ortho exam demonstrates wrist dorsiflexion of 30 on the right compared to 50 on the left.  Palmar flexion is 40 on the right compared to 70 on the left.  Grip strength is slightly less on the right compared to the left no step in the snuffbox tenderness is present.  Mild radial styloid tenderness is present.  Radial pulse intact bilaterally.  No definite paresthesias on the dorsal palmar aspect of the hand.  Specialty Comments:  No specialty comments available.  Imaging: No results found.   PMFS History: Patient Active Problem List   Diagnosis Date Noted   S/P total knee replacement, left 11/29/2022   Urinary incontinence 03/25/2022   ICH (intracerebral hemorrhage) (HCC) 02/13/2021   Iron deficiency 10/07/2020   Aortic atherosclerosis (HCC) 03/17/2020   Lung nodule seen on imaging study 02/24/2020   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   Chronic left shoulder pain 08/23/2018   Arthritis of left knee 11/02/2016   Chronic diarrhea 12/07/2014   Segmental colitis with rectal bleeding (HCC) 09/12/2014   Routine general medical examination at a health care facility 06/05/2014   Overweight 06/13/2011   Hyperlipidemia 02/17/2007   Essential hypertension 01/08/2007  Osteoarthritis 01/08/2007   Past Medical History:  Diagnosis Date   Arthritis    back, fingers with joint pain and swelling.  chronic back pain   Cataract    Chronic back pain    arthritis    Diverticulosis    Elevated cholesterol    takes Niacin daily and Simvastatin   GERD (gastroesophageal reflux disease) 06/2004   non-specific gastritis on EGD 06/2004   Headache(784.0)    occasionally   History of colon polyps 2004, 2009   2004:adenomatous. 2009 hyperplastic.    History of  hiatal hernia    Hypertension    takes Tenoretic and Lisinopril daily   Mucoid cyst of joint 08/2013   right index finger   Neuromuscular disorder (HCC)    numbness in right leg after knee replacement   Osteopenia 01/2014   T score -1.1 FRAX 14%/0.5%. Stable from prior DEXA   PONV (postoperative nausea and vomiting)    Rotator cuff arthropathy    Left   Seasonal allergies    Sleep apnea    wears CPAP nightly   Stroke (HCC) 1998   x 2 - mild left-sided weakness   Urge incontinence    Uterine prolapse     Family History  Problem Relation Age of Onset   Hypertension Mother    Heart disease Mother    Diabetes Father    Hypertension Father    Heart disease Father    Stroke Father    Diabetes Maternal Aunt    Diabetes Paternal Grandmother    Hypertension Paternal Grandfather    Stroke Paternal Grandfather    Colon cancer Neg Hx    Esophageal cancer Neg Hx    Rectal cancer Neg Hx    Stomach cancer Neg Hx    Sleep apnea Neg Hx     Past Surgical History:  Procedure Laterality Date   BACK SURGERY     x 2   BELPHAROPTOSIS REPAIR Bilateral 12/14/2017   BUBBLE STUDY  02/16/2021   Procedure: BUBBLE STUDY;  Surgeon: Vesta Mixer, MD;  Location: [facility] ENDOSCOPY;  Service: Cardiovascular;;   CARPAL TUNNEL RELEASE Bilateral    Dr August Saucer   CATARACT EXTRACTION     CATARACT EXTRACTION Bilateral 10/2017   COLONOSCOPY  2004, 2009, 2014   FINGER SURGERY     GUM SURGERY     GYNECOLOGIC CRYOSURGERY     HYSTEROSCOPY WITH D & C  12/07/2010   with resection of endometrial polyp   JOINT REPLACEMENT     KNEE ARTHROSCOPY Left 2008   lip biopsy     done at MD office Fri 11/22/13   MASS EXCISION Right 08/15/2013   Procedure: RIGHT INDEX EXCISION MASS ;  Surgeon: Tami Ribas, MD;  Location: [facility];  Service: Orthopedics;  Laterality: Right;   NASAL SEPTUM SURGERY     OOPHORECTOMY Right 2000   REVERSE SHOULDER ARTHROPLASTY Left 12/11/2018   REVERSE SHOULDER ARTHROPLASTY  Left 12/11/2018   Procedure: LEFT REVERSE SHOULDER ARTHROPLASTY;  Surgeon: Cammy Copa, MD;  Location: [facility] OR;  Service: Orthopedics;  Laterality: Left;   SHOULDER ARTHROSCOPY WITH OPEN ROTATOR CUFF REPAIR AND DISTAL CLAVICLE ACROMINECTOMY Right 11/26/2013   Procedure: RIGHT SHOULDER ARTHROSCOPY WITH MINI OPEN ROTATOR CUFF REPAIR AND DISTAL CLAVICLE RESECTION, SUBACROMIAL DECOMPRESSION, POSSIBLE [facility] PATCH.;  Surgeon: Valeria Batman, MD;  Location: [facility] OR;  Service: Orthopedics;  Laterality: Right;   TEE WITHOUT CARDIOVERSION N/A 02/16/2021   Procedure: TRANSESOPHAGEAL ECHOCARDIOGRAM (TEE);  Surgeon: Elease Hashimoto,  Deloris Ping, MD;  Location: [facility] ENDOSCOPY;  Service: Cardiovascular;  Laterality: N/A;   TOTAL KNEE ARTHROPLASTY Right 03/21/2017   TOTAL KNEE ARTHROPLASTY Right 03/21/2017   Procedure: RIGHT TOTAL KNEE ARTHROPLASTY;  Surgeon: Valeria Batman, MD;  Location: [facility] OR;  Service: Orthopedics;  Laterality: Right;   TOTAL KNEE ARTHROPLASTY Left 11/29/2022   Procedure: LEFT TOTAL KNEE ARTHROPLASTY;  Surgeon: Cammy Copa, MD;  Location: [facility] ORS;  Service: Orthopedics;  Laterality: Left;   TUBAL LIGATION     Social History   Occupational History   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

## 2023-01-31 ENCOUNTER — Ambulatory Visit: Payer: Medicare Other

## 2023-01-31 ENCOUNTER — Encounter: Payer: Self-pay | Admitting: Physical Therapy

## 2023-01-31 DIAGNOSIS — R269 Unspecified abnormalities of gait and mobility: Secondary | ICD-10-CM | POA: Diagnosis not present

## 2023-01-31 DIAGNOSIS — M25662 Stiffness of left knee, not elsewhere classified: Secondary | ICD-10-CM | POA: Diagnosis not present

## 2023-01-31 DIAGNOSIS — Z96652 Presence of left artificial knee joint: Secondary | ICD-10-CM

## 2023-01-31 DIAGNOSIS — R2681 Unsteadiness on feet: Secondary | ICD-10-CM | POA: Diagnosis not present

## 2023-01-31 DIAGNOSIS — M6281 Muscle weakness (generalized): Secondary | ICD-10-CM | POA: Diagnosis not present

## 2023-01-31 DIAGNOSIS — M25562 Pain in left knee: Secondary | ICD-10-CM | POA: Diagnosis not present

## 2023-01-31 NOTE — Therapy (Signed)
OUTPATIENT PHYSICAL THERAPY LOWER EXTREMITY TREATMENT  Patient Name: [name] MRN: [id number] DOB:24-Feb-1951, 72 y.o., female Today's Date: 01/31/2023    PT End of Session - 01/31/23 1344     Visit Number 12    Number of Visits 16    Date for PT Re-Evaluation 02/16/23    PT Start Time 1345    PT Stop Time 1428    PT Time Calculation (min) 43 min    Activity Tolerance Patient tolerated treatment well    Behavior During Therapy WFL for tasks assessed/performed               Past Medical History:  Diagnosis Date   Arthritis    back, fingers with joint pain and swelling.  chronic back pain   Cataract    Chronic back pain    arthritis    Diverticulosis    Elevated cholesterol    takes Niacin daily and Simvastatin   GERD (gastroesophageal reflux disease) 06/2004   non-specific gastritis on EGD 06/2004   Headache(784.0)    occasionally   History of colon polyps 2004, 2009   2004:adenomatous. 2009 hyperplastic.    History of hiatal hernia    Hypertension    takes Tenoretic and Lisinopril daily   Mucoid cyst of joint 08/2013   right index finger   Neuromuscular disorder (HCC)    numbness in right leg after knee replacement   Osteopenia 01/2014   T score -1.1 FRAX 14%/0.5%. Stable from prior DEXA   PONV (postoperative nausea and vomiting)    Rotator cuff arthropathy    Left   Seasonal allergies    Sleep apnea    wears CPAP nightly   Stroke (HCC) 1998   x 2 - mild left-sided weakness   Urge incontinence    Uterine prolapse    Past Surgical History:  Procedure Laterality Date   BACK SURGERY     x 2   BELPHAROPTOSIS REPAIR Bilateral 12/14/2017   BUBBLE STUDY  02/16/2021   Procedure: BUBBLE STUDY;  Surgeon: Vesta Mixer, MD;  Location: [facility] ENDOSCOPY;  Service: Cardiovascular;;   CARPAL TUNNEL RELEASE Bilateral    Dr August Saucer   CATARACT EXTRACTION     CATARACT EXTRACTION Bilateral 10/2017   COLONOSCOPY  2004, 2009, 2014   FINGER SURGERY     GUM SURGERY      GYNECOLOGIC CRYOSURGERY     HYSTEROSCOPY WITH D & C  12/07/2010   with resection of endometrial polyp   JOINT REPLACEMENT     KNEE ARTHROSCOPY Left 2008   lip biopsy     done at MD office Fri 11/22/13   MASS EXCISION Right 08/15/2013   Procedure: RIGHT INDEX EXCISION MASS ;  Surgeon: Tami Ribas, MD;  Location: [facility];  Service: Orthopedics;  Laterality: Right;   NASAL SEPTUM SURGERY     OOPHORECTOMY Right 2000   REVERSE SHOULDER ARTHROPLASTY Left 12/11/2018   REVERSE SHOULDER ARTHROPLASTY Left 12/11/2018   Procedure: LEFT REVERSE SHOULDER ARTHROPLASTY;  Surgeon: Cammy Copa, MD;  Location: [facility] OR;  Service: Orthopedics;  Laterality: Left;   SHOULDER ARTHROSCOPY WITH OPEN ROTATOR CUFF REPAIR AND DISTAL CLAVICLE ACROMINECTOMY Right 11/26/2013   Procedure: RIGHT SHOULDER ARTHROSCOPY WITH MINI OPEN ROTATOR CUFF REPAIR AND DISTAL CLAVICLE RESECTION, SUBACROMIAL DECOMPRESSION, POSSIBLE [facility] PATCH.;  Surgeon: Valeria Batman, MD;  Location: [facility] OR;  Service: Orthopedics;  Laterality: Right;   TEE WITHOUT CARDIOVERSION N/A 02/16/2021   Procedure: TRANSESOPHAGEAL ECHOCARDIOGRAM (TEE);  Surgeon:  Nahser, Deloris Ping, MD;  Location: [facility] ENDOSCOPY;  Service: Cardiovascular;  Laterality: N/A;   TOTAL KNEE ARTHROPLASTY Right 03/21/2017   TOTAL KNEE ARTHROPLASTY Right 03/21/2017   Procedure: RIGHT TOTAL KNEE ARTHROPLASTY;  Surgeon: Valeria Batman, MD;  Location: [facility] OR;  Service: Orthopedics;  Laterality: Right;   TOTAL KNEE ARTHROPLASTY Left 11/29/2022   Procedure: LEFT TOTAL KNEE ARTHROPLASTY;  Surgeon: Cammy Copa, MD;  Location: [facility] ORS;  Service: Orthopedics;  Laterality: Left;   TUBAL LIGATION     Patient Active Problem List   Diagnosis Date Noted   S/P total knee replacement, left 11/29/2022   Urinary incontinence 03/25/2022   ICH (intracerebral hemorrhage) (HCC) 02/13/2021   Iron deficiency 10/07/2020   Aortic atherosclerosis (HCC) 03/17/2020   Lung  nodule seen on imaging study 02/24/2020   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   Chronic left shoulder pain 08/23/2018   Arthritis of left knee 11/02/2016   Chronic diarrhea 12/07/2014   Segmental colitis with rectal bleeding (HCC) 09/12/2014   Routine general medical examination at a health care facility 06/05/2014   Overweight 06/13/2011   Hyperlipidemia 02/17/2007   Essential hypertension 01/08/2007   Osteoarthritis 01/08/2007    PCP: Myrlene Broker, MD  REFERRING PROVIDER: Cammy Copa, MD  REFERRING DIAG: S/P total knee replacement, left  THERAPY DIAG:  Total knee replacement status, left  Stiffness of left knee, not elsewhere classified  Gait difficulty  Muscle weakness (generalized)  Unsteadiness on feet  Rationale for Evaluation and Treatment: Rehabilitation  ONSET DATE: 11/29/2022  SUBJECTIVE:   SUBJECTIVE STATEMENT: Pt reports to PT s/p L TKA on 11/29/2022. She reports falling in hospital during her stay which also resulted in her hurting her R wrist; pt wearing a wrist brace upon arrival and using RW with armrest for R UE. Pt has had HHPT coming to visit 2-3 times a week since discharging from the hospital. Pt also reports mild numbness/tingling in the medial/lateral aspects of her L knee, as well as completely down her R leg from the knee down. Pt has hx of R TKA ~6 years ago.  PERTINENT HISTORY: Hx of R TKA (~6 years ago), reverse TSA on LUE, HTN PAIN:  Are you having pain? Yes: NPRS scale: 3/10 now/best, 8/10 at worst Pain location: L knee Aggravating factors: activity, exercise Relieving factors: Ice (polar care), tylenol, pt reports taking oxy at night PRN to help sleep  PRECAUTIONS: None  RED FLAGS: None   WEIGHT BEARING RESTRICTIONS: No  FALLS:  Has patient fallen in last 6 months? Yes. Number of falls 1  LIVING ENVIRONMENT: Lives with: lives with their spouse Lives in: House/apartment Stairs: Yes: Internal: 1 flight of  steps (doesn't need to use); External: 2 steps, rail on R going up Has following equipment at home: Single point cane and Jace Dowe - 4 wheeled  PLOF: Independent  PATIENT GOALS: Get back to being able to take care of/walk dogs, get back to walking without AD  NEXT MD VISIT: TBD  OBJECTIVE:   DIAGNOSTIC FINDINGS:  XR Wrist Complete Right:  PA, oblique, lateral, navicular views of right wrist reviewed.  No  fracture or dislocation noted.  Severe degenerative changes noted of the first [facility] joint with mild degenerative changes of the radiocarpal joint and STT joint.  Mild to moderate degenerative changes of the first MCP and IP joints.   PATIENT SURVEYS:  FOTO 43/54  COGNITION: Overall cognitive status: Within functional limits for tasks assessed     SENSATION:  Not formally tested however pt reports numbness down R LE ever since R TKA ~6 years ago  EDEMA:  Circumferential measurements:  - Right: Joint line = 45 cm, 2 inches superior to patella = 47 cm, 2 inches distal to patella = 40.5 cm  - Left: Joint line = 44 cm, 2 inches superior to patella = 49.5 cm, 2 inches distal to patella = 42 cm  POSTURE: flexed trunk   PALPATION: L knee incision site examined, no excess redness or warmth noted; mild swelling still present.   LOWER EXTREMITY ROM/:  R knee AROM: 0-102 degrees (in supine)  L knee AROM: 0-89 degrees (in supine); pt able to achieve 97 degrees active knee flexion in sitting   JOINT MOBILITY ASSESSMENTS:  L Tibiofemoral Joint:   - A/P and P/A glides hypomobile with tight end feel   - Medial/Lateral glides WNL   - IR hypomobile with soft end feel   - ER hypomobile with soft end feel, painful   -Patellar glides me/lat/sup/inf     LOWER EXTREMITY MMT:  MMT Right eval Left eval  Hip flexion 3+ (in supine) 3+ (in supine)  Hip extension 3 3-  Hip abduction 3 3  Hip adduction 3 3  Hip internal rotation    Hip external rotation    Knee flexion 3+   Knee  extension    Ankle dorsiflexion    Ankle plantarflexion    Ankle inversion    Ankle eversion    Knee strength testing deferred on L side secondary to pain  FUNCTIONAL TESTS:  30 seconds chair stand test: 7 reps Timed up and go (TUG): 20 seconds, no AD  BALANCE TESTS: SLS: 5-6 seconds bilaterally, increased instability with L side  10+ seconds tandem stance bilaterally   GAIT: Distance walked: 20 feet during TUG Assistive device utilized: None Level of assistance: Complete Independence Comments: Slow, cautious gait pattern and speed. Slow turns.    TODAY'S TREATMENT:                                                                                                                              DATE: 01/31/2023      Subjective: Patient arrives to treatment session with [facility] and R wrist brace. Reports that her MD said that she has a fracture in her R wrist but unsure of exact bone. No restrictions per patient.   Therex:  Nustep L4 (seat position 7) x 10 mins for B LE strengthening and endurance, interval history taken during.  Cues to keep SPM >60   TRX squats 3 x 10   Standing knee flexion stretch on 2nd step with emphasis on rocking back into extension between, BUE support, 10 reps of 5-10 second holds into L knee flexion   Seated L hamstring stretch 10 x 5-10 second hold  Standing marching 3 x 10 3 3# AW   Standing hip extension 3 x 10 3# AW  Standing hamstring  curls 3 x 10 3# AW   Standing heel/toe raises 3 x 10 3# AW each direction    Not today:  Sitting L knee flexion passive stretch holds with MET into extension: x10 reps - increased pain with end-range knee flexion - 110 degrees L knee flexion AROM at end of set  Manual Therapy:   Supine L A/P  tibiofemoral glides: grades 2-3 x 3 minutes Supine STM to L distal quad/ hamstring.  Instructed in scar massage with use of Vitamin E lotion.  Moderate crepitus under scar with cross-friction massage.  Improved scar appearance  after massage.  Supine L knee flexion 110 deg. (AROM)- pain tolerable.  L knee 113 deg. With marked increase in pain.    PATIENT EDUCATION:  Education details: Pt educated on HEP completion and frequency, educated to avoid using lotion at incision site until scarring/stitches have healed to avoid infection Person educated: Patient Education method: Medical illustrator Education comprehension: verbalized understanding  HOME EXERCISE PROGRAM: Access Code: [email or web address] URL: [email or web address] Date: 12/29/2022 Prepared by: Janet Berlin  Exercises - Supine Bridge with Resistance Band  - 1 x daily - 7 x weekly - 3 sets - 10 reps - Seated Long Arc Quad with Ankle Weight  - 1 x daily - 7 x weekly - 3 sets - 10 reps - Supine Hip Flexion with Ankle Weight  - 1 x daily - 7 x weekly - 3 sets - 10 reps - Prone Hip Extension with Ankle Weight  - 1 x daily - 7 x weekly - 3 sets - 10 reps - Sit to Stand with Resistance Around Legs  - 1 x daily - 7 x weekly - 3 sets - 10 reps  ASSESSMENT:  CLINICAL IMPRESSION:     Patient arrives to treatment session with SPC in hand and motivated to participate. Ambulated during session with no AD. Session focused on LE strengthening with emphasis on extensor strengthening. Tolerated treatment session well with mild complaints of fatigue at end of session. Pt will benefit from continued PT services upon discharge to safely address deficits listed in patient problem list for decreased caregiver assistance and eventual return to PLOF.   OBJECTIVE IMPAIRMENTS: decreased activity tolerance, decreased balance, decreased endurance, difficulty walking, decreased ROM, decreased strength, and pain.   ACTIVITY LIMITATIONS: squatting, stairs, transfers, and locomotion level  PARTICIPATION LIMITATIONS: cleaning, laundry, community activity, and yard work  PERSONAL FACTORS: Age and 3+ comorbidities: Hx of R TKA, hx of reverse TSA on L UE, R wrist pain  are  also affecting patient's functional outcome.   REHAB POTENTIAL: Good  CLINICAL DECISION MAKING: Stable/uncomplicated  EVALUATION COMPLEXITY: Low   GOALS:  SHORT TERM GOALS: Target date: 01/19/2023 Pt will be independent and compliant with HEP in order to increase L knee flexion AROM to at least 110 degrees needed for improved functional mobility Baseline: 97 degrees AROM in sitting position Goal status: INITIAL  2.  Pt will report subjective improvement in L knee pain with exercise on NPS scale by at least 3 points in order to show clinically significant improvement in pain levels. Baseline: 8/10 Goal status: INITIAL   LONG TERM GOALS: Target date: 02/16/2023  Pt will improve FOTO to at least 54 in order to show improved pain and function with ADL's and functional mobility. Baseline: Initial 43, 10/15: 47 Goal status: Not met, progressing   2.  Pt will be able to improve rep count during 30 second sit-to-stand test to at least 14 in order  to demonstrate improved functional independence. Baseline: 7 reps, 10/15: 9 reps Goal status: Not met, progressing  3.  Pt will decrease TUG time to at least 11 seconds in order to be below cutoff for fall-risk. Baseline: 20 seconds, no AD; 10/15: 12.8 seconds with no AD Goal status: Not met, progressing  4.  Pt will demonstrate at least 4/5 MMT scores for hip strength bilaterally in order to show improvement in LE strength needed for improved mobility and independence. Baseline: See above for initial MMT scores, 10/15: see for updated MMT scores Goal status: Not met, progressing  5.  Pt will improve distance by at least 50 meters with LRAD in order to show improved cardiovascular and LE endurance. Baseline: 9/17: 776 feet (with RW), 10/15: 753 feet (with SPC) Goal status: Not met, progressing   PLAN:  PT FREQUENCY: 2x/week  PT DURATION: 8 weeks  PLANNED INTERVENTIONS: Therapeutic exercises, Therapeutic activity, Neuromuscular  re-education, Balance training, Gait training, Patient/Family education, Joint mobilization, Stair training, Cryotherapy, Moist heat, and Manual therapy  PLAN FOR NEXT SESSION: Progress LE strengthening exercises, progress L knee flexion AROM/PROM as pain tolerates, modify/progress HEP   Maylon Peppers, PT, DPT Physical Therapist - [facility]  [facility] 01/31/23, 1:44 PM

## 2023-02-02 ENCOUNTER — Ambulatory Visit: Payer: Medicare Other | Admitting: Physical Therapy

## 2023-02-02 ENCOUNTER — Encounter: Payer: Medicare Other | Admitting: Physical Therapy

## 2023-02-02 ENCOUNTER — Telehealth: Payer: Self-pay | Admitting: Orthopedic Surgery

## 2023-02-02 ENCOUNTER — Encounter: Payer: Self-pay | Admitting: Physical Therapy

## 2023-02-02 DIAGNOSIS — Z96652 Presence of left artificial knee joint: Secondary | ICD-10-CM

## 2023-02-02 DIAGNOSIS — R2681 Unsteadiness on feet: Secondary | ICD-10-CM | POA: Diagnosis not present

## 2023-02-02 DIAGNOSIS — R269 Unspecified abnormalities of gait and mobility: Secondary | ICD-10-CM | POA: Diagnosis not present

## 2023-02-02 DIAGNOSIS — M25531 Pain in right wrist: Secondary | ICD-10-CM

## 2023-02-02 DIAGNOSIS — M25562 Pain in left knee: Secondary | ICD-10-CM

## 2023-02-02 DIAGNOSIS — M6281 Muscle weakness (generalized): Secondary | ICD-10-CM

## 2023-02-02 DIAGNOSIS — M25662 Stiffness of left knee, not elsewhere classified: Secondary | ICD-10-CM

## 2023-02-02 NOTE — Telephone Encounter (Signed)
Pt called requesting a referral to be seen by Dr Fara Boros. Please send referral for pt hand. Pt phone number is [phone].

## 2023-02-02 NOTE — Telephone Encounter (Signed)
Per her request not mine thx - ok to rf

## 2023-02-02 NOTE — Therapy (Signed)
OUTPATIENT PHYSICAL THERAPY LOWER EXTREMITY TREATMENT  Patient Name: [name] MRN: [id number] DOB:Sep 24, 1950, 72 y.o., female Today's Date: 02/02/2023    PT End of Session - 02/02/23 0949     Visit Number 13    Number of Visits 16    Date for PT Re-Evaluation 02/16/23    PT Start Time 0949    PT Stop Time 1031    PT Time Calculation (min) 42 min    Activity Tolerance Patient tolerated treatment well    Behavior During Therapy [facility] for tasks assessed/performed                Past Medical History:  Diagnosis Date   Arthritis    back, fingers with joint pain and swelling.  chronic back pain   Cataract    Chronic back pain    arthritis    Diverticulosis    Elevated cholesterol    takes Niacin daily and Simvastatin   GERD (gastroesophageal reflux disease) 06/2004   non-specific gastritis on EGD 06/2004   Headache(784.0)    occasionally   History of colon polyps 2004, 2009   2004:adenomatous. 2009 hyperplastic.    History of hiatal hernia    Hypertension    takes Tenoretic and Lisinopril daily   Mucoid cyst of joint 08/2013   right index finger   Neuromuscular disorder (HCC)    numbness in right leg after knee replacement   Osteopenia 01/2014   T score -1.1 FRAX 14%/0.5%. Stable from prior DEXA   PONV (postoperative nausea and vomiting)    Rotator cuff arthropathy    Left   Seasonal allergies    Sleep apnea    wears CPAP nightly   Stroke (HCC) 1998   x 2 - mild left-sided weakness   Urge incontinence    Uterine prolapse    Past Surgical History:  Procedure Laterality Date   BACK SURGERY     x 2   BELPHAROPTOSIS REPAIR Bilateral 12/14/2017   BUBBLE STUDY  02/16/2021   Procedure: BUBBLE STUDY;  Surgeon: Vesta Mixer, MD;  Location: [facility] ENDOSCOPY;  Service: Cardiovascular;;   CARPAL TUNNEL RELEASE Bilateral    Dr August Saucer   CATARACT EXTRACTION     CATARACT EXTRACTION Bilateral 10/2017   COLONOSCOPY  2004, 2009, 2014   FINGER SURGERY     GUM  SURGERY     GYNECOLOGIC CRYOSURGERY     HYSTEROSCOPY WITH D & C  12/07/2010   with resection of endometrial polyp   JOINT REPLACEMENT     KNEE ARTHROSCOPY Left 2008   lip biopsy     done at MD office Fri 11/22/13   MASS EXCISION Right 08/15/2013   Procedure: RIGHT INDEX EXCISION MASS ;  Surgeon: Tami Ribas, MD;  Location: [facility];  Service: Orthopedics;  Laterality: Right;   NASAL SEPTUM SURGERY     OOPHORECTOMY Right 2000   REVERSE SHOULDER ARTHROPLASTY Left 12/11/2018   REVERSE SHOULDER ARTHROPLASTY Left 12/11/2018   Procedure: LEFT REVERSE SHOULDER ARTHROPLASTY;  Surgeon: Cammy Copa, MD;  Location: [facility] OR;  Service: Orthopedics;  Laterality: Left;   SHOULDER ARTHROSCOPY WITH OPEN ROTATOR CUFF REPAIR AND DISTAL CLAVICLE ACROMINECTOMY Right 11/26/2013   Procedure: RIGHT SHOULDER ARTHROSCOPY WITH MINI OPEN ROTATOR CUFF REPAIR AND DISTAL CLAVICLE RESECTION, SUBACROMIAL DECOMPRESSION, POSSIBLE [facility] PATCH.;  Surgeon: Valeria Batman, MD;  Location: [facility] OR;  Service: Orthopedics;  Laterality: Right;   TEE WITHOUT CARDIOVERSION N/A 02/16/2021   Procedure: TRANSESOPHAGEAL ECHOCARDIOGRAM (TEE);  Surgeon: Elease Hashimoto Deloris Ping, MD;  Location: [facility] ENDOSCOPY;  Service: Cardiovascular;  Laterality: N/A;   TOTAL KNEE ARTHROPLASTY Right 03/21/2017   TOTAL KNEE ARTHROPLASTY Right 03/21/2017   Procedure: RIGHT TOTAL KNEE ARTHROPLASTY;  Surgeon: Valeria Batman, MD;  Location: [facility] OR;  Service: Orthopedics;  Laterality: Right;   TOTAL KNEE ARTHROPLASTY Left 11/29/2022   Procedure: LEFT TOTAL KNEE ARTHROPLASTY;  Surgeon: Cammy Copa, MD;  Location: [facility] ORS;  Service: Orthopedics;  Laterality: Left;   TUBAL LIGATION     Patient Active Problem List   Diagnosis Date Noted   S/P total knee replacement, left 11/29/2022   Urinary incontinence 03/25/2022   ICH (intracerebral hemorrhage) (HCC) 02/13/2021   Iron deficiency 10/07/2020   Aortic atherosclerosis (HCC) 03/17/2020    Lung nodule seen on imaging study 02/24/2020   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   Chronic left shoulder pain 08/23/2018   Arthritis of left knee 11/02/2016   Chronic diarrhea 12/07/2014   Segmental colitis with rectal bleeding (HCC) 09/12/2014   Routine general medical examination at a health care facility 06/05/2014   Overweight 06/13/2011   Hyperlipidemia 02/17/2007   Essential hypertension 01/08/2007   Osteoarthritis 01/08/2007    PCP: Myrlene Broker, MD  REFERRING PROVIDER: Cammy Copa, MD  REFERRING DIAG: S/P total knee replacement, left  THERAPY DIAG:  Total knee replacement status, left  Stiffness of left knee, not elsewhere classified  Gait difficulty  Muscle weakness (generalized)  Acute pain of left knee  Rationale for Evaluation and Treatment: Rehabilitation  ONSET DATE: 11/29/2022  SUBJECTIVE:   SUBJECTIVE STATEMENT: Pt reports to PT s/p L TKA on 11/29/2022. She reports falling in hospital during her stay which also resulted in her hurting her R wrist; pt wearing a wrist brace upon arrival and using RW with armrest for R UE. Pt has had HHPT coming to visit 2-3 times a week since discharging from the hospital. Pt also reports mild numbness/tingling in the medial/lateral aspects of her L knee, as well as completely down her R leg from the knee down. Pt has hx of R TKA ~6 years ago.  PERTINENT HISTORY: Hx of R TKA (~6 years ago), reverse TSA on LUE, HTN PAIN:  Are you having pain? Yes: NPRS scale: 3/10 now/best, 8/10 at worst Pain location: L knee Aggravating factors: activity, exercise Relieving factors: Ice (polar care), tylenol, pt reports taking oxy at night PRN to help sleep  PRECAUTIONS: None  RED FLAGS: None   WEIGHT BEARING RESTRICTIONS: No  FALLS:  Has patient fallen in last 6 months? Yes. Number of falls 1  LIVING ENVIRONMENT: Lives with: lives with their spouse Lives in: House/apartment Stairs: Yes: Internal: 1  flight of steps (doesn't need to use); External: 2 steps, rail on R going up Has following equipment at home: Single point cane and Walker - 4 wheeled  PLOF: Independent  PATIENT GOALS: Get back to being able to take care of/walk dogs, get back to walking without AD  NEXT MD VISIT: TBD  OBJECTIVE:   DIAGNOSTIC FINDINGS:  XR Wrist Complete Right:  PA, oblique, lateral, navicular views of right wrist reviewed.  No  fracture or dislocation noted.  Severe degenerative changes noted of the first [facility] joint with mild degenerative changes of the radiocarpal joint and STT joint.  Mild to moderate degenerative changes of the first MCP and IP joints.   PATIENT SURVEYS:  FOTO 43/54  COGNITION: Overall cognitive status: Within functional limits for tasks assessed  SENSATION: Not formally tested however pt reports numbness down R LE ever since R TKA ~6 years ago  EDEMA:  Circumferential measurements:  - Right: Joint line = 45 cm, 2 inches superior to patella = 47 cm, 2 inches distal to patella = 40.5 cm  - Left: Joint line = 44 cm, 2 inches superior to patella = 49.5 cm, 2 inches distal to patella = 42 cm  POSTURE: flexed trunk   PALPATION: L knee incision site examined, no excess redness or warmth noted; mild swelling still present.   LOWER EXTREMITY ROM/:  R knee AROM: 0-102 degrees (in supine)  L knee AROM: 0-89 degrees (in supine); pt able to achieve 97 degrees active knee flexion in sitting   JOINT MOBILITY ASSESSMENTS:  L Tibiofemoral Joint:   - A/P and P/A glides hypomobile with tight end feel   - Medial/Lateral glides WNL   - IR hypomobile with soft end feel   - ER hypomobile with soft end feel, painful   -Patellar glides me/lat/sup/inf     LOWER EXTREMITY MMT:  MMT Right eval Left eval  Hip flexion 3+ (in supine) 3+ (in supine)  Hip extension 3 3-  Hip abduction 3 3  Hip adduction 3 3  Hip internal rotation    Hip external rotation    Knee flexion 3+    Knee extension    Ankle dorsiflexion    Ankle plantarflexion    Ankle inversion    Ankle eversion    Knee strength testing deferred on L side secondary to pain  FUNCTIONAL TESTS:  30 seconds chair stand test: 7 reps Timed up and go (TUG): 20 seconds, no AD  BALANCE TESTS: SLS: 5-6 seconds bilaterally, increased instability with L side  10+ seconds tandem stance bilaterally   GAIT: Distance walked: 20 feet during TUG Assistive device utilized: None Level of assistance: Complete Independence Comments: Slow, cautious gait pattern and speed. Slow turns.    TODAY'S TREATMENT:                                                                                                                              DATE: 02/02/2023      Subjective: Patient arrives to treatment session with [facility] and R wrist brace. Reports that her MD said that she has a fracture in her R wrist but unsure of exact bone. No restrictions per patient.   Therex:  Nustep L4 (seat position 7) x 10 mins for B LE strengthening and endurance, interval history taken during.  Cues to keep SPM >60   TRX squats 3 x 10   Standing knee flexion stretch on 2nd step with emphasis on rocking back into extension between, BUE support, 10 reps of 5-10 second holds into L knee flexion   Seated L hamstring stretch 10 x 5-10 second hold  Standing marching 3 x 10 3 3# AW   Standing hip extension 3 x 10 3# AW  Standing  hamstring curls 3 x 10 3# AW   Standing heel/toe raises 3 x 10 3# AW each direction    Not today:  Sitting L knee flexion passive stretch holds with MET into extension: x10 reps - increased pain with end-range knee flexion - 110 degrees L knee flexion AROM at end of set  Manual Therapy:   Supine L A/P  tibiofemoral glides: grades 2-3 x 3 minutes Supine STM to L distal quad/ hamstring.  Instructed in scar massage with use of Vitamin E lotion.  Moderate crepitus under scar with cross-friction massage.  Improved scar  appearance after massage.  Supine L knee flexion 110 deg. (AROM)- pain tolerable.  L knee 113 deg. With marked increase in pain.    PATIENT EDUCATION:  Education details: Pt educated on HEP completion and frequency, educated to avoid using lotion at incision site until scarring/stitches have healed to avoid infection Person educated: Patient Education method: Medical illustrator Education comprehension: verbalized understanding  HOME EXERCISE PROGRAM: Access Code: [email or web address] URL: [email or web address] Date: 12/29/2022 Prepared by: Janet Berlin  Exercises - Supine Bridge with Resistance Band  - 1 x daily - 7 x weekly - 3 sets - 10 reps - Seated Long Arc Quad with Ankle Weight  - 1 x daily - 7 x weekly - 3 sets - 10 reps - Supine Hip Flexion with Ankle Weight  - 1 x daily - 7 x weekly - 3 sets - 10 reps - Prone Hip Extension with Ankle Weight  - 1 x daily - 7 x weekly - 3 sets - 10 reps - Sit to Stand with Resistance Around Legs  - 1 x daily - 7 x weekly - 3 sets - 10 reps  ASSESSMENT:  CLINICAL IMPRESSION:     Patient arrives to treatment session with SPC in hand and motivated to participate. Ambulated during session with no AD. Session focused on LE strengthening with emphasis on extensor strengthening. Tolerated treatment session well with mild complaints of fatigue at end of session. Pt will benefit from continued PT services upon discharge to safely address deficits listed in patient problem list for decreased caregiver assistance and eventual return to PLOF.   OBJECTIVE IMPAIRMENTS: decreased activity tolerance, decreased balance, decreased endurance, difficulty walking, decreased ROM, decreased strength, and pain.   ACTIVITY LIMITATIONS: squatting, stairs, transfers, and locomotion level  PARTICIPATION LIMITATIONS: cleaning, laundry, community activity, and yard work  PERSONAL FACTORS: Age and 3+ comorbidities: Hx of R TKA, hx of reverse TSA on L UE, R wrist  pain  are also affecting patient's functional outcome.   REHAB POTENTIAL: Good  CLINICAL DECISION MAKING: Stable/uncomplicated  EVALUATION COMPLEXITY: Low   GOALS:  SHORT TERM GOALS: Target date: 01/19/2023 Pt will be independent and compliant with HEP in order to increase L knee flexion AROM to at least 110 degrees needed for improved functional mobility Baseline: 97 degrees AROM in sitting position Goal status: INITIAL  2.  Pt will report subjective improvement in L knee pain with exercise on NPS scale by at least 3 points in order to show clinically significant improvement in pain levels. Baseline: 8/10 Goal status: INITIAL   LONG TERM GOALS: Target date: 02/16/2023  Pt will improve FOTO to at least 54 in order to show improved pain and function with ADL's and functional mobility. Baseline: Initial 43, 10/15: 47 Goal status: Not met, progressing   2.  Pt will be able to improve rep count during 30 second sit-to-stand test to at least 14 in  order to demonstrate improved functional independence. Baseline: 7 reps, 10/15: 9 reps Goal status: Not met, progressing  3.  Pt will decrease TUG time to at least 11 seconds in order to be below cutoff for fall-risk. Baseline: 20 seconds, no AD; 10/15: 12.8 seconds with no AD Goal status: Not met, progressing  4.  Pt will demonstrate at least 4/5 MMT scores for hip strength bilaterally in order to show improvement in LE strength needed for improved mobility and independence. Baseline: See above for initial MMT scores, 10/15: see for updated MMT scores Goal status: Not met, progressing  5.  Pt will improve distance by at least 50 meters with LRAD in order to show improved cardiovascular and LE endurance. Baseline: 9/17: 776 feet (with RW), 10/15: 753 feet (with SPC) Goal status: Not met, progressing   PLAN:  PT FREQUENCY: 2x/week  PT DURATION: 8 weeks  PLANNED INTERVENTIONS: Therapeutic exercises, Therapeutic activity,  Neuromuscular re-education, Balance training, Gait training, Patient/Family education, Joint mobilization, Stair training, Cryotherapy, Moist heat, and Manual therapy  PLAN FOR NEXT SESSION: Progress LE strengthening exercises, progress L knee flexion AROM/PROM as pain tolerates, modify/progress HEP   Consuela Mimes, PT, DPT [phone]  Physical Therapist - [facility] 02/02/23, 9:49 AM

## 2023-02-02 NOTE — Telephone Encounter (Signed)
Referral to Dr. Fara Boros was placed in the chart

## 2023-02-02 NOTE — Therapy (Signed)
OUTPATIENT PHYSICAL THERAPY LOWER EXTREMITY TREATMENT  Patient Name: [name] MRN: [id number] DOB:Aug 28, 1950, 72 y.o., female Today's Date: 02/02/2023    PT End of Session - 02/02/23 0949     Visit Number 13    Number of Visits 16    Date for PT Re-Evaluation 02/16/23    PT Start Time 0949    PT Stop Time 1029    PT Time Calculation (min) 40 min    Activity Tolerance Patient tolerated treatment well    Behavior During Therapy [facility] for tasks assessed/performed               Past Medical History:  Diagnosis Date   Arthritis    back, fingers with joint pain and swelling.  chronic back pain   Cataract    Chronic back pain    arthritis    Diverticulosis    Elevated cholesterol    takes Niacin daily and Simvastatin   GERD (gastroesophageal reflux disease) 06/2004   non-specific gastritis on EGD 06/2004   Headache(784.0)    occasionally   History of colon polyps 2004, 2009   2004:adenomatous. 2009 hyperplastic.    History of hiatal hernia    Hypertension    takes Tenoretic and Lisinopril daily   Mucoid cyst of joint 08/2013   right index finger   Neuromuscular disorder (HCC)    numbness in right leg after knee replacement   Osteopenia 01/2014   T score -1.1 FRAX 14%/0.5%. Stable from prior DEXA   PONV (postoperative nausea and vomiting)    Rotator cuff arthropathy    Left   Seasonal allergies    Sleep apnea    wears CPAP nightly   Stroke (HCC) 1998   x 2 - mild left-sided weakness   Urge incontinence    Uterine prolapse    Past Surgical History:  Procedure Laterality Date   BACK SURGERY     x 2   BELPHAROPTOSIS REPAIR Bilateral 12/14/2017   BUBBLE STUDY  02/16/2021   Procedure: BUBBLE STUDY;  Surgeon: Vesta Mixer, MD;  Location: [facility] ENDOSCOPY;  Service: Cardiovascular;;   CARPAL TUNNEL RELEASE Bilateral    Dr August Saucer   CATARACT EXTRACTION     CATARACT EXTRACTION Bilateral 10/2017   COLONOSCOPY  2004, 2009, 2014   FINGER SURGERY     GUM SURGERY      GYNECOLOGIC CRYOSURGERY     HYSTEROSCOPY WITH D & C  12/07/2010   with resection of endometrial polyp   JOINT REPLACEMENT     KNEE ARTHROSCOPY Left 2008   lip biopsy     done at MD office Fri 11/22/13   MASS EXCISION Right 08/15/2013   Procedure: RIGHT INDEX EXCISION MASS ;  Surgeon: Tami Ribas, MD;  Location: [facility];  Service: Orthopedics;  Laterality: Right;   NASAL SEPTUM SURGERY     OOPHORECTOMY Right 2000   REVERSE SHOULDER ARTHROPLASTY Left 12/11/2018   REVERSE SHOULDER ARTHROPLASTY Left 12/11/2018   Procedure: LEFT REVERSE SHOULDER ARTHROPLASTY;  Surgeon: Cammy Copa, MD;  Location: [facility] OR;  Service: Orthopedics;  Laterality: Left;   SHOULDER ARTHROSCOPY WITH OPEN ROTATOR CUFF REPAIR AND DISTAL CLAVICLE ACROMINECTOMY Right 11/26/2013   Procedure: RIGHT SHOULDER ARTHROSCOPY WITH MINI OPEN ROTATOR CUFF REPAIR AND DISTAL CLAVICLE RESECTION, SUBACROMIAL DECOMPRESSION, POSSIBLE [facility] PATCH.;  Surgeon: Valeria Batman, MD;  Location: [facility] OR;  Service: Orthopedics;  Laterality: Right;   TEE WITHOUT CARDIOVERSION N/A 02/16/2021   Procedure: TRANSESOPHAGEAL ECHOCARDIOGRAM (TEE);  Surgeon:  Nahser, Deloris Ping, MD;  Location: [facility] ENDOSCOPY;  Service: Cardiovascular;  Laterality: N/A;   TOTAL KNEE ARTHROPLASTY Right 03/21/2017   TOTAL KNEE ARTHROPLASTY Right 03/21/2017   Procedure: RIGHT TOTAL KNEE ARTHROPLASTY;  Surgeon: Valeria Batman, MD;  Location: [facility] OR;  Service: Orthopedics;  Laterality: Right;   TOTAL KNEE ARTHROPLASTY Left 11/29/2022   Procedure: LEFT TOTAL KNEE ARTHROPLASTY;  Surgeon: Cammy Copa, MD;  Location: [facility] ORS;  Service: Orthopedics;  Laterality: Left;   TUBAL LIGATION     Patient Active Problem List   Diagnosis Date Noted   S/P total knee replacement, left 11/29/2022   Urinary incontinence 03/25/2022   ICH (intracerebral hemorrhage) (HCC) 02/13/2021   Iron deficiency 10/07/2020   Aortic atherosclerosis (HCC) 03/17/2020   Lung  nodule seen on imaging study 02/24/2020   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   Chronic left shoulder pain 08/23/2018   Arthritis of left knee 11/02/2016   Chronic diarrhea 12/07/2014   Segmental colitis with rectal bleeding (HCC) 09/12/2014   Routine general medical examination at a health care facility 06/05/2014   Overweight 06/13/2011   Hyperlipidemia 02/17/2007   Essential hypertension 01/08/2007   Osteoarthritis 01/08/2007    PCP: Myrlene Broker, MD  REFERRING PROVIDER: Cammy Copa, MD  REFERRING DIAG: S/P total knee replacement, left  THERAPY DIAG:  Total knee replacement status, left  Stiffness of left knee, not elsewhere classified  Gait difficulty  Muscle weakness (generalized)  Acute pain of left knee  Rationale for Evaluation and Treatment: Rehabilitation  ONSET DATE: 11/29/2022  SUBJECTIVE:   SUBJECTIVE STATEMENT: Pt reports to PT s/p L TKA on 11/29/2022. She reports falling in hospital during her stay which also resulted in her hurting her R wrist; pt wearing a wrist brace upon arrival and using RW with armrest for R UE. Pt has had HHPT coming to visit 2-3 times a week since discharging from the hospital. Pt also reports mild numbness/tingling in the medial/lateral aspects of her L knee, as well as completely down her R leg from the knee down. Pt has hx of R TKA ~6 years ago.  PERTINENT HISTORY: Hx of R TKA (~6 years ago), reverse TSA on LUE, HTN PAIN:  Are you having pain? Yes: NPRS scale: 3/10 now/best, 8/10 at worst Pain location: L knee Aggravating factors: activity, exercise Relieving factors: Ice (polar care), tylenol, pt reports taking oxy at night PRN to help sleep  PRECAUTIONS: None  RED FLAGS: None   WEIGHT BEARING RESTRICTIONS: No  FALLS:  Has patient fallen in last 6 months? Yes. Number of falls 1  LIVING ENVIRONMENT: Lives with: lives with their spouse Lives in: House/apartment Stairs: Yes: Internal: 1 flight  of steps (doesn't need to use); External: 2 steps, rail on R going up Has following equipment at home: Single point cane and Walker - 4 wheeled  PLOF: Independent  PATIENT GOALS: Get back to being able to take care of/walk dogs, get back to walking without AD  NEXT MD VISIT: TBD  OBJECTIVE:   DIAGNOSTIC FINDINGS:  XR Wrist Complete Right:  PA, oblique, lateral, navicular views of right wrist reviewed.  No  fracture or dislocation noted.  Severe degenerative changes noted of the first [facility] joint with mild degenerative changes of the radiocarpal joint and STT joint.  Mild to moderate degenerative changes of the first MCP and IP joints.   PATIENT SURVEYS:  FOTO 43/54  COGNITION: Overall cognitive status: Within functional limits for tasks assessed  SENSATION: Not formally tested however pt reports numbness down R LE ever since R TKA ~6 years ago  EDEMA:  Circumferential measurements:  - Right: Joint line = 45 cm, 2 inches superior to patella = 47 cm, 2 inches distal to patella = 40.5 cm  - Left: Joint line = 44 cm, 2 inches superior to patella = 49.5 cm, 2 inches distal to patella = 42 cm  POSTURE: flexed trunk   PALPATION: L knee incision site examined, no excess redness or warmth noted; mild swelling still present.   LOWER EXTREMITY ROM/:  R knee AROM: 0-102 degrees (in supine)  L knee AROM: 0-89 degrees (in supine); pt able to achieve 97 degrees active knee flexion in sitting   JOINT MOBILITY ASSESSMENTS:  L Tibiofemoral Joint:   - A/P and P/A glides hypomobile with tight end feel   - Medial/Lateral glides WNL   - IR hypomobile with soft end feel   - ER hypomobile with soft end feel, painful   -Patellar glides me/lat/sup/inf     LOWER EXTREMITY MMT:  MMT Right eval Left eval  Hip flexion 3+ (in supine) 3+ (in supine)  Hip extension 3 3-  Hip abduction 3 3  Hip adduction 3 3  Hip internal rotation    Hip external rotation    Knee flexion 3+   Knee  extension    Ankle dorsiflexion    Ankle plantarflexion    Ankle inversion    Ankle eversion    Knee strength testing deferred on L side secondary to pain  FUNCTIONAL TESTS:  30 seconds chair stand test: 7 reps Timed up and go (TUG): 20 seconds, no AD  BALANCE TESTS: SLS: 5-6 seconds bilaterally, increased instability with L side  10+ seconds tandem stance bilaterally   GAIT: Distance walked: 20 feet during TUG Assistive device utilized: None Level of assistance: Complete Independence Comments: Slow, cautious gait pattern and speed. Slow turns.    TODAY'S TREATMENT:                                                                                                                              DATE: 02/02/2023      Subjective: Patient arrives to treatment session with no AD; R wrist brace still donned. She reports 3/10 pain and stiffness in her L knee; HEP still going well.  Therex:  Nustep L4 (seat position 7) x 10 mins for B LE strengthening and endurance, interval history taken during.  Cues to keep SPM >60   L knee AROM: 0-106 degrees in supine  TRX squats 2x15: VC's to use legs more than arms to assist with stand  High knees/lateral sidesteps in // bars: 3 laps each with 3# AW  Standing hamstring curls 2 x 10 each side, 3# AW   Standing 3-way hip:2x8 each leg, BUE support, 3# AW  Alternating 6 inch step taps: 2x10 each leg, 3# AW, no UE support  Not today:  Sitting L knee flexion passive stretch holds with MET into extension: x10 reps - increased pain with end-range knee flexion - 110 degrees L knee flexion AROM at end of set  Manual Therapy:   Supine L A/P  tibiofemoral glides: grades 2-3 x 3 minutes Supine STM to L distal quad/ hamstring.  Instructed in scar massage with use of Vitamin E lotion.  Moderate crepitus under scar with cross-friction massage.  Improved scar appearance after massage.  Supine L knee flexion 110 deg. (AROM)- pain tolerable.  L knee 113  deg. With marked increase in pain.    PATIENT EDUCATION:  Education details: Pt educated on HEP completion and frequency, educated to avoid using lotion at incision site until scarring/stitches have healed to avoid infection Person educated: Patient Education method: Medical illustrator Education comprehension: verbalized understanding  HOME EXERCISE PROGRAM: Access Code: [email or web address] URL: [email or web address] Date: 12/29/2022 Prepared by: Janet Berlin  Exercises - Supine Bridge with Resistance Band  - 1 x daily - 7 x weekly - 3 sets - 10 reps - Seated Long Arc Quad with Ankle Weight  - 1 x daily - 7 x weekly - 3 sets - 10 reps - Supine Hip Flexion with Ankle Weight  - 1 x daily - 7 x weekly - 3 sets - 10 reps - Prone Hip Extension with Ankle Weight  - 1 x daily - 7 x weekly - 3 sets - 10 reps - Sit to Stand with Resistance Around Legs  - 1 x daily - 7 x weekly - 3 sets - 10 reps  ASSESSMENT:  CLINICAL IMPRESSION:     Pt reports to PT with mild L knee pain; walking with no AD. L knee AROM remeasured at start of session; pt demonstrated improvement in total AROM but is still slightly limited with flexion (0-106 degrees AROM). Pt educated to continue with heel slides and knee flexion stretches at home in order to keep improving flexion AROM. The rest of the session consisted of exercises to improve pt's quad, hamstring, and hip strength bilaterally. Pt tolerates all exercises well with no pain, just reports of LE fatigue. Pt will benefit from continued PT services upon discharge to safely address deficits listed in patient problem list for decreased caregiver assistance and eventual return to PLOF.   OBJECTIVE IMPAIRMENTS: decreased activity tolerance, decreased balance, decreased endurance, difficulty walking, decreased ROM, decreased strength, and pain.   ACTIVITY LIMITATIONS: squatting, stairs, transfers, and locomotion level  PARTICIPATION LIMITATIONS: cleaning,  laundry, community activity, and yard work  PERSONAL FACTORS: Age and 3+ comorbidities: Hx of R TKA, hx of reverse TSA on L UE, R wrist pain  are also affecting patient's functional outcome.   REHAB POTENTIAL: Good  CLINICAL DECISION MAKING: Stable/uncomplicated  EVALUATION COMPLEXITY: Low   GOALS:  SHORT TERM GOALS: Target date: 01/19/2023 Pt will be independent and compliant with HEP in order to increase L knee flexion AROM to at least 110 degrees needed for improved functional mobility Baseline: 97 degrees AROM in sitting position, 10/24: 106 degrees AROM in sitting Goal status: Not met, progressing  2.  Pt will report subjective improvement in L knee pain with exercise on NPS scale by at least 3 points in order to show clinically significant improvement in pain levels. Baseline: 8/10, 10/24: 3/10 Goal status: GOAL MET   LONG TERM GOALS: Target date: 02/16/2023  Pt will improve FOTO to at least 54 in order to show improved pain and function with ADL's and  functional mobility. Baseline: Initial 43, 10/15: 47 Goal status: Not met, progressing   2.  Pt will be able to improve rep count during 30 second sit-to-stand test to at least 14 in order to demonstrate improved functional independence. Baseline: 7 reps, 10/15: 9 reps Goal status: Not met, progressing  3.  Pt will decrease TUG time to at least 11 seconds in order to be below cutoff for fall-risk. Baseline: 20 seconds, no AD; 10/15: 12.8 seconds with no AD Goal status: Not met, progressing  4.  Pt will demonstrate at least 4/5 MMT scores for hip strength bilaterally in order to show improvement in LE strength needed for improved mobility and independence. Baseline: See above for initial MMT scores, 10/15: see for updated MMT scores Goal status: Not met, progressing  5.  Pt will improve distance by at least 50 meters with LRAD in order to show improved cardiovascular and LE endurance. Baseline: 9/17: 776 feet (with  RW), 10/15: 753 feet (with SPC) Goal status: Not met, progressing   PLAN:  PT FREQUENCY: 2x/week  PT DURATION: 8 weeks  PLANNED INTERVENTIONS: Therapeutic exercises, Therapeutic activity, Neuromuscular re-education, Balance training, Gait training, Patient/Family education, Joint mobilization, Stair training, Cryotherapy, Moist heat, and Manual therapy  PLAN FOR NEXT SESSION: Progress LE strengthening exercises, progress L knee flexion AROM/PROM as pain tolerates, modify/progress HEP  Cammie Mcgee, PT, DPT # [id number] Cena Benton, SPT 02/02/23, 11:43 AM

## 2023-02-03 ENCOUNTER — Encounter: Payer: Self-pay | Admitting: Orthopedic Surgery

## 2023-02-07 ENCOUNTER — Encounter: Payer: Self-pay | Admitting: Physical Therapy

## 2023-02-07 ENCOUNTER — Ambulatory Visit: Payer: Medicare Other | Admitting: Physical Therapy

## 2023-02-07 DIAGNOSIS — M6281 Muscle weakness (generalized): Secondary | ICD-10-CM

## 2023-02-07 DIAGNOSIS — M25662 Stiffness of left knee, not elsewhere classified: Secondary | ICD-10-CM | POA: Diagnosis not present

## 2023-02-07 DIAGNOSIS — R269 Unspecified abnormalities of gait and mobility: Secondary | ICD-10-CM | POA: Diagnosis not present

## 2023-02-07 DIAGNOSIS — Z96652 Presence of left artificial knee joint: Secondary | ICD-10-CM

## 2023-02-07 DIAGNOSIS — R2681 Unsteadiness on feet: Secondary | ICD-10-CM | POA: Diagnosis not present

## 2023-02-07 DIAGNOSIS — M25562 Pain in left knee: Secondary | ICD-10-CM | POA: Diagnosis not present

## 2023-02-07 NOTE — Therapy (Unsigned)
OUTPATIENT PHYSICAL THERAPY LOWER EXTREMITY TREATMENT  Patient Name: [name] MRN: [id number] DOB:[birth year], 72 y.o., female Today's Date: 02/07/2023    PT End of Session - 02/07/23 1345     Visit Number 14    Number of Visits 16    Date for PT Re-Evaluation 02/16/23    PT Start Time 1346    PT Stop Time 1429    PT Time Calculation (min) 43 min    Activity Tolerance Patient tolerated treatment well    Behavior During Therapy WFL for tasks assessed/performed                Past Medical History:  Diagnosis Date   Arthritis    back, fingers with joint pain and swelling.  chronic back pain   Cataract    Chronic back pain    arthritis    Diverticulosis    Elevated cholesterol    takes Niacin daily and Simvastatin   GERD (gastroesophageal reflux disease) 06/2004   non-specific gastritis on EGD 06/2004   Headache(784.0)    occasionally   History of colon polyps 2004, 2009   2004:adenomatous. 2009 hyperplastic.    History of hiatal hernia    Hypertension    takes Tenoretic and Lisinopril daily   Mucoid cyst of joint 08/2013   right index finger   Neuromuscular disorder (HCC)    numbness in right leg after knee replacement   Osteopenia 01/2014   T score -1.1 FRAX 14%/0.5%. Stable from prior DEXA   PONV (postoperative nausea and vomiting)    Rotator cuff arthropathy    Left   Seasonal allergies    Sleep apnea    wears CPAP nightly   Stroke (HCC) 1998   x 2 - mild left-sided weakness   Urge incontinence    Uterine prolapse    Past Surgical History:  Procedure Laterality Date   BACK SURGERY     x 2   BELPHAROPTOSIS REPAIR Bilateral 12/14/2017   BUBBLE STUDY  02/16/2021   Procedure: BUBBLE STUDY;  Surgeon: Vesta Mixer, MD;  Location: [facility];  Service: Cardiovascular;;   CARPAL TUNNEL RELEASE Bilateral    Dr August Saucer   CATARACT EXTRACTION     CATARACT EXTRACTION Bilateral 10/2017   COLONOSCOPY  2004, 2009, 2014   FINGER SURGERY     GUM  SURGERY     GYNECOLOGIC CRYOSURGERY     HYSTEROSCOPY WITH D & C  12/07/2010   with resection of endometrial polyp   JOINT REPLACEMENT     KNEE ARTHROSCOPY Left 2008   lip biopsy     done at MD office Fri 11/22/13   MASS EXCISION Right 08/15/2013   Procedure: RIGHT INDEX EXCISION MASS ;  Surgeon: Tami Ribas, MD;  Location: [facility];  Service: Orthopedics;  Laterality: Right;   NASAL SEPTUM SURGERY     OOPHORECTOMY Right 2000   REVERSE SHOULDER ARTHROPLASTY Left 12/11/2018   REVERSE SHOULDER ARTHROPLASTY Left 12/11/2018   Procedure: LEFT REVERSE SHOULDER ARTHROPLASTY;  Surgeon: Cammy Copa, MD;  Location: [facility] OR;  Service: Orthopedics;  Laterality: Left;   SHOULDER ARTHROSCOPY WITH OPEN ROTATOR CUFF REPAIR AND DISTAL CLAVICLE ACROMINECTOMY Right 11/26/2013   Procedure: RIGHT SHOULDER ARTHROSCOPY WITH MINI OPEN ROTATOR CUFF REPAIR AND DISTAL CLAVICLE RESECTION, SUBACROMIAL DECOMPRESSION, POSSIBLE [facility] PATCH.;  Surgeon: Valeria Batman, MD;  Location: [facility] OR;  Service: Orthopedics;  Laterality: Right;   TEE WITHOUT CARDIOVERSION N/A 02/16/2021   Procedure: TRANSESOPHAGEAL ECHOCARDIOGRAM (TEE);  Surgeon: Elease Hashimoto Deloris Ping, MD;  Location: [facility] ENDOSCOPY;  Service: Cardiovascular;  Laterality: N/A;   TOTAL KNEE ARTHROPLASTY Right 03/21/2017   TOTAL KNEE ARTHROPLASTY Right 03/21/2017   Procedure: RIGHT TOTAL KNEE ARTHROPLASTY;  Surgeon: Valeria Batman, MD;  Location: [facility] OR;  Service: Orthopedics;  Laterality: Right;   TOTAL KNEE ARTHROPLASTY Left 11/29/2022   Procedure: LEFT TOTAL KNEE ARTHROPLASTY;  Surgeon: Cammy Copa, MD;  Location: [facility]S;  Service: Orthopedics;  Laterality: Left;   TUBAL LIGATION     Patient Active Problem List   Diagnosis Date Noted   S/P total knee replacement, left 11/29/2022   Urinary incontinence 03/25/2022   ICH (intracerebral hemorrhage) (HCC) 02/13/2021   Iron deficiency 10/07/2020   Aortic atherosclerosis (HCC) 03/17/2020    Lung nodule seen on imaging study 02/24/2020   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   Chronic left shoulder pain 08/23/2018   Arthritis of left knee 11/02/2016   Chronic diarrhea 12/07/2014   Segmental colitis with rectal bleeding (HCC) 09/12/2014   Routine general medical examination at a health care facility 06/05/2014   Overweight 06/13/2011   Hyperlipidemia 02/17/2007   Essential hypertension 01/08/2007   Osteoarthritis 01/08/2007    PCP: Myrlene Broker, MD  REFERRING PROVIDER: Cammy Copa, MD  REFERRING DIAG: S/P total knee replacement, left  THERAPY DIAG:  Total knee replacement status, left  Stiffness of left knee, not elsewhere classified  Gait difficulty  Muscle weakness (generalized)  Rationale for Evaluation and Treatment: Rehabilitation  ONSET DATE: 11/29/2022  SUBJECTIVE:   SUBJECTIVE STATEMENT: Pt reports to PT s/p L TKA on 11/29/2022. She reports falling in hospital during her stay which also resulted in her hurting her R wrist; pt wearing a wrist brace upon arrival and using RW with armrest for R UE. Pt has had HHPT coming to visit 2-3 times a week since discharging from the hospital. Pt also reports mild numbness/tingling in the medial/lateral aspects of her L knee, as well as completely down her R leg from the knee down. Pt has hx of R TKA ~6 years ago.  PERTINENT HISTORY: Hx of R TKA (~6 years ago), reverse TSA on LUE, HTN PAIN:  Are you having pain? Yes: NPRS scale: 3/10 now/best, 8/10 at worst Pain location: L knee Aggravating factors: activity, exercise Relieving factors: Ice (polar care), tylenol, pt reports taking oxy at night PRN to help sleep  PRECAUTIONS: None  RED FLAGS: None   WEIGHT BEARING RESTRICTIONS: No  FALLS:  Has patient fallen in last 6 months? Yes. Number of falls 1  LIVING ENVIRONMENT: Lives with: lives with their spouse Lives in: House/apartment Stairs: Yes: Internal: 1 flight of steps (doesn't  need to use); External: 2 steps, rail on R going up Has following equipment at home: Single point cane and Walker - 4 wheeled  PLOF: Independent  PATIENT GOALS: Get back to being able to take care of/walk dogs, get back to walking without AD  NEXT MD VISIT: TBD  OBJECTIVE:   DIAGNOSTIC FINDINGS:  XR Wrist Complete Right:  PA, oblique, lateral, navicular views of right wrist reviewed.  No  fracture or dislocation noted.  Severe degenerative changes noted of the first [facility] joint with mild degenerative changes of the radiocarpal joint and STT joint.  Mild to moderate degenerative changes of the first MCP and IP joints.   PATIENT SURVEYS:  FOTO 43/54  COGNITION: Overall cognitive status: Within functional limits for tasks assessed     SENSATION: Not formally tested  however pt reports numbness down R LE ever since R TKA ~6 years ago  EDEMA:  Circumferential measurements:  - Right: Joint line = 45 cm, 2 inches superior to patella = 47 cm, 2 inches distal to patella = 40.5 cm  - Left: Joint line = 44 cm, 2 inches superior to patella = 49.5 cm, 2 inches distal to patella = 42 cm  POSTURE: flexed trunk   PALPATION: L knee incision site examined, no excess redness or warmth noted; mild swelling still present.   LOWER EXTREMITY ROM/:  R knee AROM: 0-102 degrees (in supine)  L knee AROM: 0-89 degrees (in supine); pt able to achieve 97 degrees active knee flexion in sitting   JOINT MOBILITY ASSESSMENTS:  L Tibiofemoral Joint:   - A/P and P/A glides hypomobile with tight end feel   - Medial/Lateral glides WNL   - IR hypomobile with soft end feel   - ER hypomobile with soft end feel, painful   -Patellar glides me/lat/sup/inf     LOWER EXTREMITY MMT:  MMT Right eval Left eval  Hip flexion 3+ (in supine) 3+ (in supine)  Hip extension 3 3-  Hip abduction 3 3  Hip adduction 3 3  Hip internal rotation    Hip external rotation    Knee flexion 3+   Knee extension    Ankle  dorsiflexion    Ankle plantarflexion    Ankle inversion    Ankle eversion    Knee strength testing deferred on L side secondary to pain  FUNCTIONAL TESTS:  30 seconds chair stand test: 7 reps Timed up and go (TUG): 20 seconds, no AD  BALANCE TESTS: SLS: 5-6 seconds bilaterally, increased instability with L side  10+ seconds tandem stance bilaterally   GAIT: Distance walked: 20 feet during TUG Assistive device utilized: None Level of assistance: Complete Independence Comments: Slow, cautious gait pattern and speed. Slow turns.    TODAY'S TREATMENT:                                                                                                                              DATE: 02/07/2023      Subjective: Patient arrives to treatment session with no AD; R wrist brace still donned. She reports 3-4 L knee stiffness/discomfort right now; 6/10 R wrist pain right now as well. She says she has a doctor's appointment December 2nd to discuss next steps for her wrist.  Therex:  Nustep L4 (seat position 7) x 10 mins for B LE strengthening and endurance, interval history taken during.  Cues to keep SPM >60   TRX squats 2x20: VC's to use legs more than arms to assist with stand  High knees/lateral sidesteps in // bars: 3 laps each with 4# AW  Standing hamstring curls 2 x 10 each side, 4# AW   Standing 3-way hip:2x10 each leg/direction, BUE support, 4# AW  Standing heel/toe raises: 2x10 each direction with 4# AW  Sitting L knee flexion passive stretch holds with MET into extension: x10 reps - increased pain with end-range knee flexion - 112 degrees R knee flexion AROM   Not today:  Manual Therapy:   Supine L A/P  tibiofemoral glides: grades 2-3 x 3 minutes Supine STM to L distal quad/ hamstring.  Instructed in scar massage with use of Vitamin E lotion.  Moderate crepitus under scar with cross-friction massage.  Improved scar appearance after massage.  Supine L knee flexion 110 deg.  (AROM)- pain tolerable.  L knee 113 deg. With marked increase in pain.    PATIENT EDUCATION:  Education details: Pt educated on HEP completion and frequency, educated to avoid using lotion at incision site until scarring/stitches have healed to avoid infection Person educated: Patient Education method: Medical illustrator Education comprehension: verbalized understanding  HOME EXERCISE PROGRAM: Access Code: [email or web address] URL: [email or web address] Date: 12/29/2022 Prepared by: Janet Berlin  Exercises - Supine Bridge with Resistance Band  - 1 x daily - 7 x weekly - 3 sets - 10 reps - Seated Long Arc Quad with Ankle Weight  - 1 x daily - 7 x weekly - 3 sets - 10 reps - Supine Hip Flexion with Ankle Weight  - 1 x daily - 7 x weekly - 3 sets - 10 reps - Prone Hip Extension with Ankle Weight  - 1 x daily - 7 x weekly - 3 sets - 10 reps - Sit to Stand with Resistance Around Legs  - 1 x daily - 7 x weekly - 3 sets - 10 reps  ASSESSMENT:  CLINICAL IMPRESSION:     Pt reports to PT with mild L knee pain and soreness. Manual stretching for L knee flexion AROM performed at start of session; pt is still limited with knee flexion ROM but demonstrated improvement compared to last week. The rest of the session consisted of exercises aimed to improve her quad, hamstring, and hip strength and stability. Pt able to tolerate increased weight with all her exercises without reports of increased L knee pain, just muscular fatigue. HEP will be modified and progressed next  session to improve pt carryover for improving her strength and function at home. Pt will benefit from continued PT services upon discharge to safely address deficits listed in patient problem list for decreased caregiver assistance and eventual return to PLOF.   OBJECTIVE IMPAIRMENTS: decreased activity tolerance, decreased balance, decreased endurance, difficulty walking, decreased ROM, decreased strength, and pain.    ACTIVITY LIMITATIONS: squatting, stairs, transfers, and locomotion level  PARTICIPATION LIMITATIONS: cleaning, laundry, community activity, and yard work  PERSONAL FACTORS: Age and 3+ comorbidities: Hx of R TKA, hx of reverse TSA on L UE, R wrist pain  are also affecting patient's functional outcome.   REHAB POTENTIAL: Good  CLINICAL DECISION MAKING: Stable/uncomplicated  EVALUATION COMPLEXITY: Low   GOALS:  SHORT TERM GOALS: Target date: 01/19/2023 Pt will be independent and compliant with HEP in order to increase L knee flexion AROM to at least 110 degrees needed for improved functional mobility Baseline: 97 degrees AROM in sitting position, 10/24: 106 degrees AROM in sitting Goal status: Not met, progressing  2.  Pt will report subjective improvement in L knee pain with exercise on NPS scale by at least 3 points in order to show clinically significant improvement in pain levels. Baseline: 8/10, 10/24: 3/10 Goal status: GOAL MET   LONG TERM GOALS: Target date: 02/16/2023  Pt will improve FOTO to at least 54 in order to  show improved pain and function with ADL's and functional mobility. Baseline: Initial 43, 10/15: 47 Goal status: Not met, progressing   2.  Pt will be able to improve rep count during 30 second sit-to-stand test to at least 14 in order to demonstrate improved functional independence. Baseline: 7 reps, 10/15: 9 reps Goal status: Not met, progressing  3.  Pt will decrease TUG time to at least 11 seconds in order to be below cutoff for fall-risk. Baseline: 20 seconds, no AD; 10/15: 12.8 seconds with no AD Goal status: Not met, progressing  4.  Pt will demonstrate at least 4/5 MMT scores for hip strength bilaterally in order to show improvement in LE strength needed for improved mobility and independence. Baseline: See above for initial MMT scores, 10/15: see for updated MMT scores Goal status: Not met, progressing  5.  Pt will improve distance by at  least 50 meters with LRAD in order to show improved cardiovascular and LE endurance. Baseline: 9/17: 776 feet (with RW), 10/15: 753 feet (with SPC) Goal status: Not met, progressing   PLAN:  PT FREQUENCY: 2x/week  PT DURATION: 8 weeks  PLANNED INTERVENTIONS: Therapeutic exercises, Therapeutic activity, Neuromuscular re-education, Balance training, Gait training, Patient/Family education, Joint mobilization, Stair training, Cryotherapy, Moist heat, and Manual therapy  PLAN FOR NEXT SESSION: Progress LE strengthening exercises, progress L knee flexion AROM/PROM as pain tolerates, modify/progress HEP (ankle weight exercises, sit-to-stands, alternating marches, 3-way hip)  Isabellah Sobocinski, SPT 02/07/23, 2:41 PM

## 2023-02-09 ENCOUNTER — Ambulatory Visit: Payer: Medicare Other

## 2023-02-09 ENCOUNTER — Encounter: Payer: Self-pay | Admitting: Physical Therapy

## 2023-02-09 DIAGNOSIS — M6281 Muscle weakness (generalized): Secondary | ICD-10-CM

## 2023-02-09 DIAGNOSIS — Z96652 Presence of left artificial knee joint: Secondary | ICD-10-CM | POA: Diagnosis not present

## 2023-02-09 DIAGNOSIS — M25662 Stiffness of left knee, not elsewhere classified: Secondary | ICD-10-CM

## 2023-02-09 DIAGNOSIS — R269 Unspecified abnormalities of gait and mobility: Secondary | ICD-10-CM | POA: Diagnosis not present

## 2023-02-09 DIAGNOSIS — R2681 Unsteadiness on feet: Secondary | ICD-10-CM | POA: Diagnosis not present

## 2023-02-09 DIAGNOSIS — M25562 Pain in left knee: Secondary | ICD-10-CM | POA: Diagnosis not present

## 2023-02-09 NOTE — Therapy (Signed)
OUTPATIENT PHYSICAL THERAPY LOWER EXTREMITY TREATMENT  Patient Name: [name] MRN: [id number] DOB:06-25-1950, 72 y.o., female Today's Date: 02/09/2023    PT End of Session - 02/09/23 1349     Visit Number 15    Number of Visits 16    Date for PT Re-Evaluation 02/16/23    PT Start Time 1347    PT Stop Time 1430    PT Time Calculation (min) 43 min    Activity Tolerance Patient tolerated treatment well    Behavior During Therapy WFL for tasks assessed/performed                 Past Medical History:  Diagnosis Date   Arthritis    back, fingers with joint pain and swelling.  chronic back pain   Cataract    Chronic back pain    arthritis    Diverticulosis    Elevated cholesterol    takes Niacin daily and Simvastatin   GERD (gastroesophageal reflux disease) 06/2004   non-specific gastritis on EGD 06/2004   Headache(784.0)    occasionally   History of colon polyps 2004, 2009   2004:adenomatous. 2009 hyperplastic.    History of hiatal hernia    Hypertension    takes Tenoretic and Lisinopril daily   Mucoid cyst of joint 08/2013   right index finger   Neuromuscular disorder (HCC)    numbness in right leg after knee replacement   Osteopenia 01/2014   T score -1.1 FRAX 14%/0.5%. Stable from prior DEXA   PONV (postoperative nausea and vomiting)    Rotator cuff arthropathy    Left   Seasonal allergies    Sleep apnea    wears CPAP nightly   Stroke (HCC) 1998   x 2 - mild left-sided weakness   Urge incontinence    Uterine prolapse    Past Surgical History:  Procedure Laterality Date   BACK SURGERY     x 2   BELPHAROPTOSIS REPAIR Bilateral 12/14/2017   BUBBLE STUDY  02/16/2021   Procedure: BUBBLE STUDY;  Surgeon: Vesta Mixer, MD;  Location: [facility] ENDOSCOPY;  Service: Cardiovascular;;   CARPAL TUNNEL RELEASE Bilateral    Dr August Saucer   CATARACT EXTRACTION     CATARACT EXTRACTION Bilateral 10/2017   COLONOSCOPY  2004, 2009, 2014   FINGER SURGERY     GUM  SURGERY     GYNECOLOGIC CRYOSURGERY     HYSTEROSCOPY WITH D & C  12/07/2010   with resection of endometrial polyp   JOINT REPLACEMENT     KNEE ARTHROSCOPY Left 2008   lip biopsy     done at MD office Fri 11/22/13   MASS EXCISION Right 08/15/2013   Procedure: RIGHT INDEX EXCISION MASS ;  Surgeon: Tami Ribas, MD;  Location: [facility];  Service: Orthopedics;  Laterality: Right;   NASAL SEPTUM SURGERY     OOPHORECTOMY Right 2000   REVERSE SHOULDER ARTHROPLASTY Left 12/11/2018   REVERSE SHOULDER ARTHROPLASTY Left 12/11/2018   Procedure: LEFT REVERSE SHOULDER ARTHROPLASTY;  Surgeon: Cammy Copa, MD;  Location: [facility] OR;  Service: Orthopedics;  Laterality: Left;   SHOULDER ARTHROSCOPY WITH OPEN ROTATOR CUFF REPAIR AND DISTAL CLAVICLE ACROMINECTOMY Right 11/26/2013   Procedure: RIGHT SHOULDER ARTHROSCOPY WITH MINI OPEN ROTATOR CUFF REPAIR AND DISTAL CLAVICLE RESECTION, SUBACROMIAL DECOMPRESSION, POSSIBLE [facility] PATCH.;  Surgeon: Valeria Batman, MD;  Location: [facility] OR;  Service: Orthopedics;  Laterality: Right;   TEE WITHOUT CARDIOVERSION N/A 02/16/2021   Procedure: TRANSESOPHAGEAL ECHOCARDIOGRAM (TEE);  Surgeon: Elease Hashimoto Deloris Ping, MD;  Location: [facility] ENDOSCOPY;  Service: Cardiovascular;  Laterality: N/A;   TOTAL KNEE ARTHROPLASTY Right 03/21/2017   TOTAL KNEE ARTHROPLASTY Right 03/21/2017   Procedure: RIGHT TOTAL KNEE ARTHROPLASTY;  Surgeon: Valeria Batman, MD;  Location: [facility] OR;  Service: Orthopedics;  Laterality: Right;   TOTAL KNEE ARTHROPLASTY Left 11/29/2022   Procedure: LEFT TOTAL KNEE ARTHROPLASTY;  Surgeon: Cammy Copa, MD;  Location: [facility] ORS;  Service: Orthopedics;  Laterality: Left;   TUBAL LIGATION     Patient Active Problem List   Diagnosis Date Noted   S/P total knee replacement, left 11/29/2022   Urinary incontinence 03/25/2022   ICH (intracerebral hemorrhage) (HCC) 02/13/2021   Iron deficiency 10/07/2020   Aortic atherosclerosis (HCC) 03/17/2020    Lung nodule seen on imaging study 02/24/2020   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   Chronic left shoulder pain 08/23/2018   Arthritis of left knee 11/02/2016   Chronic diarrhea 12/07/2014   Segmental colitis with rectal bleeding (HCC) 09/12/2014   Routine general medical examination at a health care facility 06/05/2014   Overweight 06/13/2011   Hyperlipidemia 02/17/2007   Essential hypertension 01/08/2007   Osteoarthritis 01/08/2007    PCP: Myrlene Broker, MD  REFERRING PROVIDER: Cammy Copa, MD  REFERRING DIAG: S/P total knee replacement, left  THERAPY DIAG:  Total knee replacement status, left  Stiffness of left knee, not elsewhere classified  Gait difficulty  Muscle weakness (generalized)  Rationale for Evaluation and Treatment: Rehabilitation  ONSET DATE: 11/29/2022  SUBJECTIVE:   SUBJECTIVE STATEMENT: Pt reports to PT s/p L TKA on 11/29/2022. She reports falling in hospital during her stay which also resulted in her hurting her R wrist; pt wearing a wrist brace upon arrival and using RW with armrest for R UE. Pt has had HHPT coming to visit 2-3 times a week since discharging from the hospital. Pt also reports mild numbness/tingling in the medial/lateral aspects of her L knee, as well as completely down her R leg from the knee down. Pt has hx of R TKA ~6 years ago.  PERTINENT HISTORY: Hx of R TKA (~6 years ago), reverse TSA on LUE, HTN PAIN:  Are you having pain? Yes: NPRS scale: 3/10 now/best, 8/10 at worst Pain location: L knee Aggravating factors: activity, exercise Relieving factors: Ice (polar care), tylenol, pt reports taking oxy at night PRN to help sleep  PRECAUTIONS: None  RED FLAGS: None   WEIGHT BEARING RESTRICTIONS: No  FALLS:  Has patient fallen in last 6 months? Yes. Number of falls 1  LIVING ENVIRONMENT: Lives with: lives with their spouse Lives in: House/apartment Stairs: Yes: Internal: 1 flight of steps (doesn't  need to use); External: 2 steps, rail on R going up Has following equipment at home: Single point cane and Walker - 4 wheeled  PLOF: Independent  PATIENT GOALS: Get back to being able to take care of/walk dogs, get back to walking without AD  NEXT MD VISIT: TBD  OBJECTIVE:   DIAGNOSTIC FINDINGS:  XR Wrist Complete Right:  PA, oblique, lateral, navicular views of right wrist reviewed.  No  fracture or dislocation noted.  Severe degenerative changes noted of the first [facility] joint with mild degenerative changes of the radiocarpal joint and STT joint.  Mild to moderate degenerative changes of the first MCP and IP joints.   PATIENT SURVEYS:  FOTO 43/54  COGNITION: Overall cognitive status: Within functional limits for tasks assessed     SENSATION: Not formally tested  however pt reports numbness down R LE ever since R TKA ~6 years ago  EDEMA:  Circumferential measurements:  - Right: Joint line = 45 cm, 2 inches superior to patella = 47 cm, 2 inches distal to patella = 40.5 cm  - Left: Joint line = 44 cm, 2 inches superior to patella = 49.5 cm, 2 inches distal to patella = 42 cm  POSTURE: flexed trunk   PALPATION: L knee incision site examined, no excess redness or warmth noted; mild swelling still present.   LOWER EXTREMITY ROM/:  R knee AROM: 0-102 degrees (in supine)  L knee AROM: 0-89 degrees (in supine); pt able to achieve 97 degrees active knee flexion in sitting   JOINT MOBILITY ASSESSMENTS:  L Tibiofemoral Joint:   - A/P and P/A glides hypomobile with tight end feel   - Medial/Lateral glides WNL   - IR hypomobile with soft end feel   - ER hypomobile with soft end feel, painful   -Patellar glides me/lat/sup/inf     LOWER EXTREMITY MMT:  MMT Right eval Left eval  Hip flexion 3+ (in supine) 3+ (in supine)  Hip extension 3 3-  Hip abduction 3 3  Hip adduction 3 3  Hip internal rotation    Hip external rotation    Knee flexion 3+   Knee extension    Ankle  dorsiflexion    Ankle plantarflexion    Ankle inversion    Ankle eversion    Knee strength testing deferred on L side secondary to pain  FUNCTIONAL TESTS:  30 seconds chair stand test: 7 reps Timed up and go (TUG): 20 seconds, no AD  BALANCE TESTS: SLS: 5-6 seconds bilaterally, increased instability with L side  10+ seconds tandem stance bilaterally   GAIT: Distance walked: 20 feet during TUG Assistive device utilized: None Level of assistance: Complete Independence Comments: Slow, cautious gait pattern and speed. Slow turns.    TODAY'S TREATMENT:                                                                                                                              DATE: 02/09/2023      Subjective: Patient arrives to treatment session with no AD; R wrist brace still donned. She reports feeling "a little worse" than last session; 6/10 R wrist pain and 4/10 L knee pain right now. She says she still hasn't gotten the results of her MRI on her wrist.  Therex:  Nustep L4 (seat position 7) x 10 mins for B LE strengthening and endurance, interval history taken during.  Cues to keep SPM >60   Resisted Gait with black TB: 5x each direction - mild L knee pain throughout   Sit-to-stands with weighted press out: 2x10 each  Alternating 6-inch step-taps: 4# AW, 2x10 each leg, no UE support  3-inch step downs with LLE (standing on L, tapping R): 2x10 - tried on 6-inch but regressed due to weakness in leg  and pain  Walking high knees in // bars: no AW today, no UE support: 3 laps     Not today:  Standing 3-way hip: 2x10 each leg/direction, BUE support, 4# AW Standing heel/toe raises: 2x10 each direction with 4# AW  Sitting L knee flexion passive stretch holds with MET into extension: x10 reps - increased pain with end-range knee flexion - 112 degrees R knee flexion AROM TRX squats 2x20: VC's to use legs more than arms to assist with stand High knees/lateral sidesteps in // bars:  3 laps each with 4# AW  Standing hamstring curls 2 x 10 each side, 4# AW  Manual Therapy:   Supine L A/P  tibiofemoral glides: grades 2-3 x 3 minutes Supine STM to L distal quad/ hamstring.  Instructed in scar massage with use of Vitamin E lotion.  Moderate crepitus under scar with cross-friction massage.  Improved scar appearance after massage.  Supine L knee flexion 110 deg. (AROM)- pain tolerable.  L knee 113 deg. With marked increase in pain.    PATIENT EDUCATION:  Education details: Pt educated on HEP completion and frequency, educated to avoid using lotion at incision site until scarring/stitches have healed to avoid infection Person educated: Patient Education method: Medical illustrator Education comprehension: verbalized understanding  HOME EXERCISE PROGRAM: Access Code: [email or web address] URL: [email or web address] Date: 02/09/2023 Prepared by: Maylon Peppers  Exercises - Walking March  - 2-3 x daily - 5-7 x weekly - 3 sets - 10 reps - Sidestepping  - 2-3 x daily - 5-7 x weekly - 3 sets - 10 reps - Standing 3-Way Kick  - 2-3 x daily - 5-7 x weekly - 3 sets - 10 reps - Step Taps on High Step  - 2-3 x daily - 5-7 x weekly - 3 sets - 10 reps - Sit to Stand  - 2-3 x daily - 5-7 x weekly - 3 sets - 10 reps - Supine Bridge  - 2-3 x daily - 5-7 x weekly - 3 sets - 10 reps  ASSESSMENT:  CLINICAL IMPRESSION:     Pt reports to PT with mild L knee pain and soreness; still experiencing moderate R wrist pain. Pt's hip, quad, and hamstring strengthening exercises progressed today without any adverse responses. Pt's HEP modified and progressed today to include higher-level strengthening exercises. Pt educated to perform HEP over the weekend as long as the new exercises were not significantly painful. Pt will benefit from continued PT services upon discharge to safely address deficits listed in patient problem list for decreased caregiver assistance and eventual return to  PLOF.   OBJECTIVE IMPAIRMENTS: decreased activity tolerance, decreased balance, decreased endurance, difficulty walking, decreased ROM, decreased strength, and pain.   ACTIVITY LIMITATIONS: squatting, stairs, transfers, and locomotion level  PARTICIPATION LIMITATIONS: cleaning, laundry, community activity, and yard work  PERSONAL FACTORS: Age and 3+ comorbidities: Hx of R TKA, hx of reverse TSA on L UE, R wrist pain  are also affecting patient's functional outcome.   REHAB POTENTIAL: Good  CLINICAL DECISION MAKING: Stable/uncomplicated  EVALUATION COMPLEXITY: Low   GOALS:  SHORT TERM GOALS: Target date: 01/19/2023 Pt will be independent and compliant with HEP in order to increase L knee flexion AROM to at least 110 degrees needed for improved functional mobility Baseline: 97 degrees AROM in sitting position, 10/24: 106 degrees AROM in sitting Goal status: Not met, progressing  2.  Pt will report subjective improvement in L knee pain with exercise on NPS scale by at least 3  points in order to show clinically significant improvement in pain levels. Baseline: 8/10, 10/24: 3/10 Goal status: GOAL MET   LONG TERM GOALS: Target date: 02/16/2023  Pt will improve FOTO to at least 54 in order to show improved pain and function with ADL's and functional mobility. Baseline: Initial 43, 10/15: 47 Goal status: Not met, progressing   2.  Pt will be able to improve rep count during 30 second sit-to-stand test to at least 14 in order to demonstrate improved functional independence. Baseline: 7 reps, 10/15: 9 reps Goal status: Not met, progressing  3.  Pt will decrease TUG time to at least 11 seconds in order to be below cutoff for fall-risk. Baseline: 20 seconds, no AD; 10/15: 12.8 seconds with no AD Goal status: Not met, progressing  4.  Pt will demonstrate at least 4/5 MMT scores for hip strength bilaterally in order to show improvement in LE strength needed for improved mobility and  independence. Baseline: See above for initial MMT scores, 10/15: see for updated MMT scores Goal status: Not met, progressing  5.  Pt will improve distance by at least 50 meters with LRAD in order to show improved cardiovascular and LE endurance. Baseline: 9/17: 776 feet (with RW), 10/15: 753 feet (with SPC) Goal status: Not met, progressing   PLAN:  PT FREQUENCY: 2x/week  PT DURATION: 8 weeks  PLANNED INTERVENTIONS: Therapeutic exercises, Therapeutic activity, Neuromuscular re-education, Balance training, Gait training, Patient/Family education, Joint mobilization, Stair training, Cryotherapy, Moist heat, and Manual therapy  PLAN FOR NEXT SESSION: Progress LE strengthening exercises, progress L knee flexion AROM/PROM as pain tolerates, follow-up on new HEP, progress quad strengthening exercises (step downs, wall-sits, standing TKE, total gym)  Ike Maragh, SPT  Maylon Peppers, PT, DPT Physical Therapist - [facility]  [facility]  02/09/23, 2:38 PM

## 2023-02-13 ENCOUNTER — Ambulatory Visit: Payer: Medicare Other | Attending: Orthopedic Surgery | Admitting: Physical Therapy

## 2023-02-13 ENCOUNTER — Encounter: Payer: Self-pay | Admitting: Physical Therapy

## 2023-02-13 DIAGNOSIS — R269 Unspecified abnormalities of gait and mobility: Secondary | ICD-10-CM | POA: Diagnosis not present

## 2023-02-13 DIAGNOSIS — M25662 Stiffness of left knee, not elsewhere classified: Secondary | ICD-10-CM | POA: Diagnosis not present

## 2023-02-13 DIAGNOSIS — Z96652 Presence of left artificial knee joint: Secondary | ICD-10-CM | POA: Insufficient documentation

## 2023-02-13 DIAGNOSIS — R2681 Unsteadiness on feet: Secondary | ICD-10-CM | POA: Diagnosis not present

## 2023-02-13 DIAGNOSIS — M25562 Pain in left knee: Secondary | ICD-10-CM | POA: Insufficient documentation

## 2023-02-13 DIAGNOSIS — M6281 Muscle weakness (generalized): Secondary | ICD-10-CM | POA: Diagnosis not present

## 2023-02-13 NOTE — Therapy (Signed)
OUTPATIENT PHYSICAL THERAPY LOWER EXTREMITY TREATMENT  Patient Name: [name] MRN: [id number] DOB:Jul 25, 1950, 72 y.o., female Today's Date: 02/13/2023    PT End of Session - 02/13/23 1517     Visit Number 16    Number of Visits 16    Date for PT Re-Evaluation 02/16/23    PT Start Time 1514    PT Stop Time 1558    PT Time Calculation (min) 44 min    Activity Tolerance Patient tolerated treatment well    Behavior During Therapy WFL for tasks assessed/performed             Past Medical History:  Diagnosis Date   Arthritis    back, fingers with joint pain and swelling.  chronic back pain   Cataract    Chronic back pain    arthritis    Diverticulosis    Elevated cholesterol    takes Niacin daily and Simvastatin   GERD (gastroesophageal reflux disease) 06/2004   non-specific gastritis on EGD 06/2004   Headache(784.0)    occasionally   History of colon polyps 2004, 2009   2004:adenomatous. 2009 hyperplastic.    History of hiatal hernia    Hypertension    takes Tenoretic and Lisinopril daily   Mucoid cyst of joint 08/2013   right index finger   Neuromuscular disorder (HCC)    numbness in right leg after knee replacement   Osteopenia 01/2014   T score -1.1 FRAX 14%/0.5%. Stable from prior DEXA   PONV (postoperative nausea and vomiting)    Rotator cuff arthropathy    Left   Seasonal allergies    Sleep apnea    wears CPAP nightly   Stroke (HCC) 1998   x 2 - mild left-sided weakness   Urge incontinence    Uterine prolapse    Past Surgical History:  Procedure Laterality Date   BACK SURGERY     x 2   BELPHAROPTOSIS REPAIR Bilateral 12/14/2017   BUBBLE STUDY  02/16/2021   Procedure: BUBBLE STUDY;  Surgeon: Vesta Mixer, MD;  Location: [facility] ENDOSCOPY;  Service: Cardiovascular;;   CARPAL TUNNEL RELEASE Bilateral    Dr August Saucer   CATARACT EXTRACTION     CATARACT EXTRACTION Bilateral 10/2017   COLONOSCOPY  2004, 2009, 2014   FINGER SURGERY     GUM SURGERY      GYNECOLOGIC CRYOSURGERY     HYSTEROSCOPY WITH D & C  12/07/2010   with resection of endometrial polyp   JOINT REPLACEMENT     KNEE ARTHROSCOPY Left 2008   lip biopsy     done at MD office Fri 11/22/13   MASS EXCISION Right 08/15/2013   Procedure: RIGHT INDEX EXCISION MASS ;  Surgeon: Tami Ribas, MD;  Location: [facility];  Service: Orthopedics;  Laterality: Right;   NASAL SEPTUM SURGERY     OOPHORECTOMY Right 2000   REVERSE SHOULDER ARTHROPLASTY Left 12/11/2018   REVERSE SHOULDER ARTHROPLASTY Left 12/11/2018   Procedure: LEFT REVERSE SHOULDER ARTHROPLASTY;  Surgeon: Cammy Copa, MD;  Location: [facility] OR;  Service: Orthopedics;  Laterality: Left;   SHOULDER ARTHROSCOPY WITH OPEN ROTATOR CUFF REPAIR AND DISTAL CLAVICLE ACROMINECTOMY Right 11/26/2013   Procedure: RIGHT SHOULDER ARTHROSCOPY WITH MINI OPEN ROTATOR CUFF REPAIR AND DISTAL CLAVICLE RESECTION, SUBACROMIAL DECOMPRESSION, POSSIBLE [facility] PATCH.;  Surgeon: Valeria Batman, MD;  Location: [facility] OR;  Service: Orthopedics;  Laterality: Right;   TEE WITHOUT CARDIOVERSION N/A 02/16/2021   Procedure: TRANSESOPHAGEAL ECHOCARDIOGRAM (TEE);  Surgeon: Kristeen Miss  J, MD;  Location: [facility] ENDOSCOPY;  Service: Cardiovascular;  Laterality: N/A;   TOTAL KNEE ARTHROPLASTY Right 03/21/2017   TOTAL KNEE ARTHROPLASTY Right 03/21/2017   Procedure: RIGHT TOTAL KNEE ARTHROPLASTY;  Surgeon: Valeria Batman, MD;  Location: [facility] OR;  Service: Orthopedics;  Laterality: Right;   TOTAL KNEE ARTHROPLASTY Left 11/29/2022   Procedure: LEFT TOTAL KNEE ARTHROPLASTY;  Surgeon: Cammy Copa, MD;  Location: [facility] ORS;  Service: Orthopedics;  Laterality: Left;   TUBAL LIGATION     Patient Active Problem List   Diagnosis Date Noted   S/P total knee replacement, left 11/29/2022   Urinary incontinence 03/25/2022   ICH (intracerebral hemorrhage) (HCC) 02/13/2021   Iron deficiency 10/07/2020   Aortic atherosclerosis (HCC) 03/17/2020   Lung  nodule seen on imaging study 02/24/2020   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   Chronic left shoulder pain 08/23/2018   Arthritis of left knee 11/02/2016   Chronic diarrhea 12/07/2014   Segmental colitis with rectal bleeding (HCC) 09/12/2014   Routine general medical examination at a health care facility 06/05/2014   Overweight 06/13/2011   Hyperlipidemia 02/17/2007   Essential hypertension 01/08/2007   Osteoarthritis 01/08/2007    PCP: Myrlene Broker, MD  REFERRING PROVIDER: Cammy Copa, MD  REFERRING DIAG: S/P total knee replacement, left  THERAPY DIAG:  Total knee replacement status, left  Stiffness of left knee, not elsewhere classified  Gait difficulty  Muscle weakness (generalized)  Rationale for Evaluation and Treatment: Rehabilitation  ONSET DATE: 11/29/2022  SUBJECTIVE:   SUBJECTIVE STATEMENT: Pt reports to PT s/p L TKA on 11/29/2022. She reports falling in hospital during her stay which also resulted in her hurting her R wrist; pt wearing a wrist brace upon arrival and using RW with armrest for R UE. Pt has had HHPT coming to visit 2-3 times a week since discharging from the hospital. Pt also reports mild numbness/tingling in the medial/lateral aspects of her L knee, as well as completely down her R leg from the knee down. Pt has hx of R TKA ~6 years ago.  PERTINENT HISTORY: Hx of R TKA (~6 years ago), reverse TSA on LUE, HTN PAIN:  Are you having pain? Yes: NPRS scale: 3/10 now/best, 8/10 at worst Pain location: L knee Aggravating factors: activity, exercise Relieving factors: Ice (polar care), tylenol, pt reports taking oxy at night PRN to help sleep  PRECAUTIONS: None  RED FLAGS: None   WEIGHT BEARING RESTRICTIONS: No  FALLS:  Has patient fallen in last 6 months? Yes. Number of falls 1  LIVING ENVIRONMENT: Lives with: lives with their spouse Lives in: House/apartment Stairs: Yes: Internal: 1 flight of steps (doesn't need to  use); External: 2 steps, rail on R going up Has following equipment at home: Single point cane and Walker - 4 wheeled  PLOF: Independent  PATIENT GOALS: Get back to being able to take care of/walk dogs, get back to walking without AD  NEXT MD VISIT: TBD  OBJECTIVE:   DIAGNOSTIC FINDINGS:  XR Wrist Complete Right:  PA, oblique, lateral, navicular views of right wrist reviewed.  No  fracture or dislocation noted.  Severe degenerative changes noted of the first [facility] joint with mild degenerative changes of the radiocarpal joint and STT joint.  Mild to moderate degenerative changes of the first MCP and IP joints.   PATIENT SURVEYS:  FOTO 43/54  COGNITION: Overall cognitive status: Within functional limits for tasks assessed     SENSATION: Not formally tested however pt reports  numbness down R LE ever since R TKA ~6 years ago  EDEMA:  Circumferential measurements:  - Right: Joint line = 45 cm, 2 inches superior to patella = 47 cm, 2 inches distal to patella = 40.5 cm  - Left: Joint line = 44 cm, 2 inches superior to patella = 49.5 cm, 2 inches distal to patella = 42 cm  POSTURE: flexed trunk   PALPATION: L knee incision site examined, no excess redness or warmth noted; mild swelling still present.   LOWER EXTREMITY ROM/:  R knee AROM: 0-102 degrees (in supine)  L knee AROM: 0-89 degrees (in supine); pt able to achieve 97 degrees active knee flexion in sitting   JOINT MOBILITY ASSESSMENTS:  L Tibiofemoral Joint:   - A/P and P/A glides hypomobile with tight end feel   - Medial/Lateral glides WNL   - IR hypomobile with soft end feel   - ER hypomobile with soft end feel, painful   -Patellar glides me/lat/sup/inf     LOWER EXTREMITY MMT:  MMT Right eval Left eval  Hip flexion 3+ (in supine) 3+ (in supine)  Hip extension 3 3-  Hip abduction 3 3  Hip adduction 3 3  Hip internal rotation    Hip external rotation    Knee flexion 3+   Knee extension    Ankle  dorsiflexion    Ankle plantarflexion    Ankle inversion    Ankle eversion    Knee strength testing deferred on L side secondary to pain  FUNCTIONAL TESTS:  30 seconds chair stand test: 7 reps Timed up and go (TUG): 20 seconds, no AD  BALANCE TESTS: SLS: 5-6 seconds bilaterally, increased instability with L side  10+ seconds tandem stance bilaterally   GAIT: Distance walked: 20 feet during TUG Assistive device utilized: None Level of assistance: Complete Independence Comments: Slow, cautious gait pattern and speed. Slow turns.    TODAY'S TREATMENT:                                                                                                                              DATE: 02/13/2023      Subjective: Patient arrives to treatment session with no AD; R wrist brace still donned. Pt reports she finally got the results of her R wrist MRI and says she has a stress fracture. Pt reports 5/10 in terms of stiffness in her L knee.  Therex:  Nustep L4 (seat position 7) x 10 mins for B LE strengthening and endurance, interval history taken during.  Cues to keep SPM >60   High knees/lateral sidesteps in // bars: 3 laps each with 5# AW   Standing hamstring curls 2 x 10 each side, 5# AW  Resisted Gait with black TB: 5x each direction - mild L knee pain throughout   Sit-to-stands with weighted press out: 2x10 each  Physio-ball wall sits: 3x20 second holds - pt reports increased quad fatigue bilaterally   3-inch  step downs: 2x10 each side, BUE support for balance only - pt reports feeling big difference between R and L sides (L fatigues quicker)    Not today:  Alternating 6-inch step-taps: 4# AW, 2x10 each leg, no UE support  Standing 3-way hip: 2x10 each leg/direction, BUE support, 4# AW Standing heel/toe raises: 2x10 each direction with 4# AW  Sitting L knee flexion passive stretch holds with MET into extension: x10 reps - increased pain with end-range knee flexion - 112 degrees R  knee flexion AROM TRX squats 2x20: VC's to use legs more than arms to assist with stand Manual Therapy:   Supine L A/P  tibiofemoral glides: grades 2-3 x 3 minutes Supine STM to L distal quad/ hamstring.  Instructed in scar massage with use of Vitamin E lotion.  Moderate crepitus under scar with cross-friction massage.  Improved scar appearance after massage.  Supine L knee flexion 110 deg. (AROM)- pain tolerable.  L knee 113 deg. With marked increase in pain.    PATIENT EDUCATION:  Education details: Pt educated on HEP completion and frequency, educated to avoid using lotion at incision site until scarring/stitches have healed to avoid infection Person educated: Patient Education method: Medical illustrator Education comprehension: verbalized understanding  HOME EXERCISE PROGRAM: Access Code: [email or web address] URL: [email or web address] Date: 02/09/2023 Prepared by: Maylon Peppers  Exercises - Walking March  - 2-3 x daily - 5-7 x weekly - 3 sets - 10 reps - Sidestepping  - 2-3 x daily - 5-7 x weekly - 3 sets - 10 reps - Standing 3-Way Kick  - 2-3 x daily - 5-7 x weekly - 3 sets - 10 reps - Step Taps on High Step  - 2-3 x daily - 5-7 x weekly - 3 sets - 10 reps - Sit to Stand  - 2-3 x daily - 5-7 x weekly - 3 sets - 10 reps - Supine Bridge  - 2-3 x daily - 5-7 x weekly - 3 sets - 10 reps  ASSESSMENT:  CLINICAL IMPRESSION:     Pt reports to PT with usual L knee pain and soreness. No significant changes made during today's session. Exercises continue to emphasize improving pt's quad, hamstring, and hip strength. Pt continues to demonstrate increased L quad and hamstring weakness compared to the R side. Wall-sits added today to improve quad strength; pt only able to hold each bout ~20 seconds before needing to rest due to fatigue. No adverse responses or increased pain reported throughout the exercises today, just reports of muscular fatigue and legs feeling "wobbly." Next  session will consist of reassessment to determine future PT needs. Pt will benefit from continued PT services upon discharge to safely address deficits listed in patient problem list for decreased caregiver assistance and eventual return to PLOF.   OBJECTIVE IMPAIRMENTS: decreased activity tolerance, decreased balance, decreased endurance, difficulty walking, decreased ROM, decreased strength, and pain.   ACTIVITY LIMITATIONS: squatting, stairs, transfers, and locomotion level  PARTICIPATION LIMITATIONS: cleaning, laundry, community activity, and yard work  PERSONAL FACTORS: Age and 3+ comorbidities: Hx of R TKA, hx of reverse TSA on L UE, R wrist pain  are also affecting patient's functional outcome.   REHAB POTENTIAL: Good  CLINICAL DECISION MAKING: Stable/uncomplicated  EVALUATION COMPLEXITY: Low   GOALS:  SHORT TERM GOALS: Target date: 01/19/2023 Pt will be independent and compliant with HEP in order to increase L knee flexion AROM to at least 110 degrees needed for improved functional mobility Baseline: 97 degrees AROM in sitting  position, 10/24: 106 degrees AROM in sitting Goal status: Not met, progressing  2.  Pt will report subjective improvement in L knee pain with exercise on NPS scale by at least 3 points in order to show clinically significant improvement in pain levels. Baseline: 8/10, 10/24: 3/10 Goal status: GOAL MET   LONG TERM GOALS: Target date: 02/16/2023  Pt will improve FOTO to at least 54 in order to show improved pain and function with ADL's and functional mobility. Baseline: Initial 43, 10/15: 47 Goal status: Not met, progressing   2.  Pt will be able to improve rep count during 30 second sit-to-stand test to at least 14 in order to demonstrate improved functional independence. Baseline: 7 reps, 10/15: 9 reps Goal status: Not met, progressing  3.  Pt will decrease TUG time to at least 11 seconds in order to be below cutoff for fall-risk. Baseline: 20  seconds, no AD; 10/15: 12.8 seconds with no AD Goal status: Not met, progressing  4.  Pt will demonstrate at least 4/5 MMT scores for hip strength bilaterally in order to show improvement in LE strength needed for improved mobility and independence. Baseline: See above for initial MMT scores, 10/15: see for updated MMT scores Goal status: Not met, progressing  5.  Pt will improve distance by at least 50 meters with LRAD in order to show improved cardiovascular and LE endurance. Baseline: 9/17: 776 feet (with RW), 10/15: 753 feet (with SPC) Goal status: Not met, progressing   PLAN:  PT FREQUENCY: 2x/week  PT DURATION: 8 weeks  PLANNED INTERVENTIONS: Therapeutic exercises, Therapeutic activity, Neuromuscular re-education, Balance training, Gait training, Patient/Family education, Joint mobilization, Stair training, Cryotherapy, Moist heat, and Manual therapy  PLAN FOR NEXT SESSION: Progress LE strengthening exercises, progress L knee flexion AROM/PROM as pain tolerates, follow-up on new HEP, progress quad strengthening exercises (step downs, wall-sits, standing TKE, total gym), RECERTIFICATION, REASSESS LTG's, determine future PT needs   Cammie Mcgee, PT, DPT # [phone] Cena Benton, SPT 02/13/23, 4:01 PM

## 2023-02-14 ENCOUNTER — Encounter: Payer: Medicare Other | Admitting: Physical Therapy

## 2023-02-15 ENCOUNTER — Encounter: Payer: Self-pay | Admitting: Physical Therapy

## 2023-02-15 ENCOUNTER — Ambulatory Visit: Payer: Medicare Other | Admitting: Physical Therapy

## 2023-02-15 DIAGNOSIS — Z96652 Presence of left artificial knee joint: Secondary | ICD-10-CM | POA: Diagnosis not present

## 2023-02-15 DIAGNOSIS — M25562 Pain in left knee: Secondary | ICD-10-CM

## 2023-02-15 DIAGNOSIS — R269 Unspecified abnormalities of gait and mobility: Secondary | ICD-10-CM | POA: Diagnosis not present

## 2023-02-15 DIAGNOSIS — R2681 Unsteadiness on feet: Secondary | ICD-10-CM | POA: Diagnosis not present

## 2023-02-15 DIAGNOSIS — M6281 Muscle weakness (generalized): Secondary | ICD-10-CM | POA: Diagnosis not present

## 2023-02-15 DIAGNOSIS — M25662 Stiffness of left knee, not elsewhere classified: Secondary | ICD-10-CM | POA: Diagnosis not present

## 2023-02-15 NOTE — Therapy (Signed)
OUTPATIENT PHYSICAL THERAPY LOWER EXTREMITY TREATMENT/ RECERTIFICATION  Patient Name: [name] MRN: [id number] DOB:1951/04/02, 72 y.o., female Today's Date: 02/16/2023    PT End of Session - 02/15/23 1513     Visit Number 17    Number of Visits 21    Date for PT Re-Evaluation 03/15/23    PT Start Time 1511    PT Stop Time 1600    PT Time Calculation (min) 49 min    Activity Tolerance Patient tolerated treatment well    Behavior During Therapy WFL for tasks assessed/performed             Past Medical History:  Diagnosis Date   Arthritis    back, fingers with joint pain and swelling.  chronic back pain   Cataract    Chronic back pain    arthritis    Diverticulosis    Elevated cholesterol    takes Niacin daily and Simvastatin   GERD (gastroesophageal reflux disease) 06/2004   non-specific gastritis on EGD 06/2004   Headache(784.0)    occasionally   History of colon polyps 2004, 2009   2004:adenomatous. 2009 hyperplastic.    History of hiatal hernia    Hypertension    takes Tenoretic and Lisinopril daily   Mucoid cyst of joint 08/2013   right index finger   Neuromuscular disorder (HCC)    numbness in right leg after knee replacement   Osteopenia 01/2014   T score -1.1 FRAX 14%/0.5%. Stable from prior DEXA   PONV (postoperative nausea and vomiting)    Rotator cuff arthropathy    Left   Seasonal allergies    Sleep apnea    wears CPAP nightly   Stroke (HCC) 1998   x 2 - mild left-sided weakness   Urge incontinence    Uterine prolapse    Past Surgical History:  Procedure Laterality Date   BACK SURGERY     x 2   BELPHAROPTOSIS REPAIR Bilateral 12/14/2017   BUBBLE STUDY  02/16/2021   Procedure: BUBBLE STUDY;  Surgeon: Vesta Mixer, MD;  Location: [facility] ENDOSCOPY;  Service: Cardiovascular;;   CARPAL TUNNEL RELEASE Bilateral    Dr August Saucer   CATARACT EXTRACTION     CATARACT EXTRACTION Bilateral 10/2017   COLONOSCOPY  2004, 2009, 2014   FINGER SURGERY      GUM SURGERY     GYNECOLOGIC CRYOSURGERY     HYSTEROSCOPY WITH D & C  12/07/2010   with resection of endometrial polyp   JOINT REPLACEMENT     KNEE ARTHROSCOPY Left 2008   lip biopsy     done at MD office Fri 11/22/13   MASS EXCISION Right 08/15/2013   Procedure: RIGHT INDEX EXCISION MASS ;  Surgeon: Tami Ribas, MD;  Location: [facility];  Service: Orthopedics;  Laterality: Right;   NASAL SEPTUM SURGERY     OOPHORECTOMY Right 2000   REVERSE SHOULDER ARTHROPLASTY Left 12/11/2018   REVERSE SHOULDER ARTHROPLASTY Left 12/11/2018   Procedure: LEFT REVERSE SHOULDER ARTHROPLASTY;  Surgeon: Cammy Copa, MD;  Location: [facility] OR;  Service: Orthopedics;  Laterality: Left;   SHOULDER ARTHROSCOPY WITH OPEN ROTATOR CUFF REPAIR AND DISTAL CLAVICLE ACROMINECTOMY Right 11/26/2013   Procedure: RIGHT SHOULDER ARTHROSCOPY WITH MINI OPEN ROTATOR CUFF REPAIR AND DISTAL CLAVICLE RESECTION, SUBACROMIAL DECOMPRESSION, POSSIBLE [facility] PATCH.;  Surgeon: Valeria Batman, MD;  Location: [facility] OR;  Service: Orthopedics;  Laterality: Right;   TEE WITHOUT CARDIOVERSION N/A 02/16/2021   Procedure: TRANSESOPHAGEAL ECHOCARDIOGRAM (TEE);  Surgeon: Elease Hashimoto,  Deloris Ping, MD;  Location: [facility] ENDOSCOPY;  Service: Cardiovascular;  Laterality: N/A;   TOTAL KNEE ARTHROPLASTY Right 03/21/2017   TOTAL KNEE ARTHROPLASTY Right 03/21/2017   Procedure: RIGHT TOTAL KNEE ARTHROPLASTY;  Surgeon: Valeria Batman, MD;  Location: [facility] OR;  Service: Orthopedics;  Laterality: Right;   TOTAL KNEE ARTHROPLASTY Left 11/29/2022   Procedure: LEFT TOTAL KNEE ARTHROPLASTY;  Surgeon: Cammy Copa, MD;  Location: [facility] ORS;  Service: Orthopedics;  Laterality: Left;   TUBAL LIGATION     Patient Active Problem List   Diagnosis Date Noted   S/P total knee replacement, left 11/29/2022   Urinary incontinence 03/25/2022   ICH (intracerebral hemorrhage) (HCC) 02/13/2021   Iron deficiency 10/07/2020   Aortic atherosclerosis (HCC)  03/17/2020   Lung nodule seen on imaging study 02/24/2020   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   Chronic left shoulder pain 08/23/2018   Arthritis of left knee 11/02/2016   Chronic diarrhea 12/07/2014   Segmental colitis with rectal bleeding (HCC) 09/12/2014   Routine general medical examination at a health care facility 06/05/2014   Overweight 06/13/2011   Hyperlipidemia 02/17/2007   Essential hypertension 01/08/2007   Osteoarthritis 01/08/2007    PCP: Myrlene Broker, MD  REFERRING PROVIDER: Cammy Copa, MD  REFERRING DIAG: S/P total knee replacement, left  THERAPY DIAG:  Total knee replacement status, left  Stiffness of left knee, not elsewhere classified  Gait difficulty  Muscle weakness (generalized)  Acute pain of left knee  Unsteadiness on feet  Rationale for Evaluation and Treatment: Rehabilitation  ONSET DATE: 11/29/2022  SUBJECTIVE:   SUBJECTIVE STATEMENT: Pt reports to PT s/p L TKA on 11/29/2022. She reports falling in hospital during her stay which also resulted in her hurting her R wrist; pt wearing a wrist brace upon arrival and using RW with armrest for R UE. Pt has had HHPT coming to visit 2-3 times a week since discharging from the hospital. Pt also reports mild numbness/tingling in the medial/lateral aspects of her L knee, as well as completely down her R leg from the knee down. Pt has hx of R TKA ~6 years ago.  PERTINENT HISTORY: Hx of R TKA (~6 years ago), reverse TSA on LUE, HTN PAIN:  Are you having pain? Yes: NPRS scale: 3/10 now/best, 8/10 at worst Pain location: L knee Aggravating factors: activity, exercise Relieving factors: Ice (polar care), tylenol, pt reports taking oxy at night PRN to help sleep  PRECAUTIONS: None  RED FLAGS: None   WEIGHT BEARING RESTRICTIONS: No  FALLS:  Has patient fallen in last 6 months? Yes. Number of falls 1  LIVING ENVIRONMENT: Lives with: lives with their spouse Lives in:  House/apartment Stairs: Yes: Internal: 1 flight of steps (doesn't need to use); External: 2 steps, rail on R going up Has following equipment at home: Single point cane and Walker - 4 wheeled  PLOF: Independent  PATIENT GOALS: Get back to being able to take care of/walk dogs, get back to walking without AD  NEXT MD VISIT: TBD  OBJECTIVE:   DIAGNOSTIC FINDINGS:  XR Wrist Complete Right:  PA, oblique, lateral, navicular views of right wrist reviewed.  No  fracture or dislocation noted.  Severe degenerative changes noted of the first [facility] joint with mild degenerative changes of the radiocarpal joint and STT joint.  Mild to moderate degenerative changes of the first MCP and IP joints.   PATIENT SURVEYS:  FOTO 43/54  COGNITION: Overall cognitive status: Within functional limits for tasks assessed  SENSATION: Not formally tested however pt reports numbness down R LE ever since R TKA ~6 years ago  EDEMA:  Circumferential measurements:  - Right: Joint line = 45 cm, 2 inches superior to patella = 47 cm, 2 inches distal to patella = 40.5 cm  - Left: Joint line = 44 cm, 2 inches superior to patella = 49.5 cm, 2 inches distal to patella = 42 cm  POSTURE: flexed trunk   PALPATION: L knee incision site examined, no excess redness or warmth noted; mild swelling still present.   LOWER EXTREMITY ROM/:  R knee AROM: 0-102 degrees (in supine)  L knee AROM: 0-89 degrees (in supine); pt able to achieve 97 degrees active knee flexion in sitting   JOINT MOBILITY ASSESSMENTS:  L Tibiofemoral Joint:   - A/P and P/A glides hypomobile with tight end feel   - Medial/Lateral glides WNL   - IR hypomobile with soft end feel   - ER hypomobile with soft end feel, painful   -Patellar glides me/lat/sup/inf     LOWER EXTREMITY MMT:  MMT Right eval Left eval  Hip flexion 3+ (in supine) 3+ (in supine)  Hip extension 3 3-  Hip abduction 3 3  Hip adduction 3 3  Hip internal rotation    Hip  external rotation    Knee flexion 3+   Knee extension    Ankle dorsiflexion    Ankle plantarflexion    Ankle inversion    Ankle eversion    Knee strength testing deferred on L side secondary to pain  FUNCTIONAL TESTS:  30 seconds chair stand test: 7 reps Timed up and go (TUG): 20 seconds, no AD  BALANCE TESTS: SLS: 5-6 seconds bilaterally, increased instability with L side  10+ seconds tandem stance bilaterally   GAIT: Distance walked: 20 feet during TUG Assistive device utilized: None Level of assistance: Complete Independence Comments: Slow, cautious gait pattern and speed. Slow turns.    TODAY'S TREATMENT:                                                                                                                              DATE: 02/16/2023      Subjective: Patient arrives to treatment session with no AD; R wrist brace still donned.  Pt. Has been going to massage therapist every couple of weeks.   Pt reports 3/10 in terms of stiffness in her L knee.    Therex:  Nustep L4 (seat position 7) x 10 mins for B LE strengthening and endurance, interval history taken during.  Consistent cadence/ no increase c/o pain.   High knees/lateral sidesteps in // bars: 3 laps each with 5# AW   Reassessment of goals (see below).    Recip. Stairs with L handrail 3x with SBA/ mod. I.  Pt. Fearful of ascending stairs without UE assist.   Supine L knee extension (0 deg.) and flexion (113 deg.)- stiffness/ discomfort.    Seated LAQ:  5# ankle wts.    Standing hamstring curls/ hip flexion/ abduction/ extension 2 x 10 each side, 5# AW  Discussed walking on sand/ uneven terrain (will focus on next tx. Session prior to pt. Going to beach).    Not today:  Alternating 6-inch step-taps: 4# AW, 2x10 each leg, no UE support  Standing 3-way hip: 2x10 each leg/direction, BUE support, 4# AW Standing heel/toe raises: 2x10 each direction with 4# AW  Sitting L knee flexion passive stretch holds  with MET into extension: x10 reps - increased pain with end-range knee flexion - 112 degrees R knee flexion AROM TRX squats 2x20: VC's to use legs more than arms to assist with stand Manual Therapy:   Supine L A/P  tibiofemoral glides: grades 2-3 x 3 minutes Supine STM to L distal quad/ hamstring.  Instructed in scar massage with use of Vitamin E lotion.  Moderate crepitus under scar with cross-friction massage.  Improved scar appearance after massage.  Supine L knee flexion 110 deg. (AROM)- pain tolerable.  L knee 113 deg. With marked increase in pain.    PATIENT EDUCATION:  Education details: Pt educated on HEP completion and frequency, educated to avoid using lotion at incision site until scarring/stitches have healed to avoid infection Person educated: Patient Education method: Medical illustrator Education comprehension: verbalized understanding  HOME EXERCISE PROGRAM: Access Code: [email or web address] URL: [email or web address] Date: 02/09/2023 Prepared by: Maylon Peppers  Exercises - Walking March  - 2-3 x daily - 5-7 x weekly - 3 sets - 10 reps - Sidestepping  - 2-3 x daily - 5-7 x weekly - 3 sets - 10 reps - Standing 3-Way Kick  - 2-3 x daily - 5-7 x weekly - 3 sets - 10 reps - Step Taps on High Step  - 2-3 x daily - 5-7 x weekly - 3 sets - 10 reps - Sit to Stand  - 2-3 x daily - 5-7 x weekly - 3 sets - 10 reps - Supine Bridge  - 2-3 x daily - 5-7 x weekly - 3 sets - 10 reps  ASSESSMENT:  CLINICAL IMPRESSION:     Pt reports to PT with usual L knee pain and soreness. Exercises continue to emphasize improving pt's quad, hamstring, and hip strength. Pt continues to demonstrate increased L quad and hamstring weakness compared to the R side. No adverse responses or increased pain reported throughout the exercises today, just reports of muscular fatigue and legs feeling "wobbly." Next session will consist of walking on uneven or sand surfaces to simulate beach.  PT  decreased tx. Frequency to 1x/week with more focus on HEP. Pt will benefit from continued PT services upon discharge to safely address deficits listed in patient problem list for decreased caregiver assistance and eventual return to PLOF.   OBJECTIVE IMPAIRMENTS: decreased activity tolerance, decreased balance, decreased endurance, difficulty walking, decreased ROM, decreased strength, and pain.   ACTIVITY LIMITATIONS: squatting, stairs, transfers, and locomotion level  PARTICIPATION LIMITATIONS: cleaning, laundry, community activity, and yard work  PERSONAL FACTORS: Age and 3+ comorbidities: Hx of R TKA, hx of reverse TSA on L UE, R wrist pain  are also affecting patient's functional outcome.   REHAB POTENTIAL: Good  CLINICAL DECISION MAKING: Stable/uncomplicated  EVALUATION COMPLEXITY: Low   GOALS:  LONG TERM GOALS: Target date: 03/15/2023  Pt will improve FOTO to at least 54 in order to show improved pain and function with ADL's and functional mobility. Baseline: Initial 43, 10/15: 47 Goal status: Not met, progressing  2.  Pt will be able to improve rep count during 30 second sit-to-stand test to at least 14 in order to demonstrate improved functional independence. Baseline: 7 reps, 10/15: 9 reps.  11/6: 11 reps Goal status: Partially met  3.  Pt will decrease TUG time to at least 11 seconds in order to be below cutoff for fall-risk. Baseline: 20 seconds, no AD; 10/15: 12.8 seconds with no AD.  11/6: 10.43 sec.  Goal status: Goal met  4.  Pt will demonstrate at least 4/5 MMT scores for hip strength bilaterally in order to show improvement in LE strength needed for improved mobility and independence. Baseline: See above for initial MMT scores, 10/15: see for updated MMT scores.  11/6: B hip flexion 4/5 MMT Goal status: Partially met  5.  Pt will improve distance by at least 50 meters with LRAD in order to show improved cardiovascular and LE endurance. Baseline: 9/17: 776  feet (with RW), 10/15: 753 feet (with SPC) Goal status: Not met, progressing   PLAN:  PT FREQUENCY: 1x/week  PT DURATION: 4 weeks  PLANNED INTERVENTIONS: Therapeutic exercises, Therapeutic activity, Neuromuscular re-education, Balance training, Gait training, Patient/Family education, Joint mobilization, Stair training, Cryotherapy, Moist heat, and Manual therapy  PLAN FOR NEXT SESSION: Progress LE strengthening exercises, progress L knee flexion AROM/PROM as pain tolerates, follow-up on new HEP, progress quad strengthening exercises (step downs, wall-sits, standing TKE, total gym), SIMULATE walking on sand/ uneven terrain   Cammie Mcgee, PT, DPT # [phone] 02/16/23, 8:50 AM

## 2023-02-16 ENCOUNTER — Encounter: Payer: Medicare Other | Admitting: Physical Therapy

## 2023-02-20 ENCOUNTER — Ambulatory Visit (INDEPENDENT_AMBULATORY_CARE_PROVIDER_SITE_OTHER): Payer: Medicare Other | Admitting: Adult Health

## 2023-02-20 ENCOUNTER — Encounter: Payer: Self-pay | Admitting: Adult Health

## 2023-02-20 VITALS — BP 130/74 | HR 82 | Ht 62.0 in | Wt 163.0 lb

## 2023-02-20 DIAGNOSIS — G4733 Obstructive sleep apnea (adult) (pediatric): Secondary | ICD-10-CM | POA: Diagnosis not present

## 2023-02-20 DIAGNOSIS — I610 Nontraumatic intracerebral hemorrhage in hemisphere, subcortical: Secondary | ICD-10-CM

## 2023-02-20 NOTE — Patient Instructions (Addendum)
Your Plan:  Continue nightly use of CPAP for sleep apnea management   Will discuss being a candidate for Inspire device or oral appliance with Dr. Frances Furbish - I will keep you posted via MyChart   Continue to follow with DME [facility] for any needed supplies or CPAP related concerns     Follow up in 1 year or call earlier if needed      Thank you for coming to see [place] at [facility]. I hope we have been able to provide you high quality care today.  You may receive a patient satisfaction survey over the next few weeks. We would appreciate your feedback and comments so that we may continue to improve ourselves and the health of our patients.

## 2023-02-20 NOTE — Progress Notes (Signed)
[facility] [address] [place]. [place] [address] [phone]       OFFICE FOLLOW UP NOTE  [name] Date of Birth:  [birth year] Medical Record Number:  [id number]    Primary neurologist: Dr. Frances Furbish Reason for visit: CPAP and stroke follow-up    SUBJECTIVE:   CHIEF COMPLAINT:  Chief Complaint  Patient presents with   Follow-up    Rm 3, here alone Pt is here for OSA with CPAP follow up. Pt states she using her CPAP, however she has trouble with leaking at times. States she has questions regarding the Inspire    HPI:   Update 02/20/2023 JM: Patient returns for CPAP follow-up.  Previously seen by Dr. Frances Furbish 02/28/2022 who noted improvement of apnea after pressure setting changes in 11/2021 but still slightly elevated therefore further adjusted pressure to a maximum of 16.   CPAP compliance over the past 30 days shows excellent usage at 100% with greatly improved residual AHI of 3.3 on pressure setting of 6-16.  Pressure in the 95 percentile 14.2.  Leaks in the 95th percentile 0.3.  Continues to struggle with CPAP tolerance despite trial of multiple different masks, currently use FFM. Can be difficult to fall asleep d/t mask position or having leaks which usually resolve after readjustment. She questions use of Inspire device. S/p left knee replacement back in August, fell while in hospital resulting in right wrist fracture, currently working with PT and being closely followed by orthopedics. She has been experiencing some increased fatigue since that time.  ESS 8/24.  Stable from stroke standpoint. No new stroke/TIA symptoms.  Compliant on aspirin and simvastatin.  Routinely follows with PCP for stroke risk factor management.        History provided for reference purposes only Update 11/25/2021 JM: Patient returns for initial CPAP compliance visit.  Previously seen 5 months ago for stroke follow-up and as stable with close PCP follow-up, she was  advised to follow-up as needed in regard to stroke.  Seen by Dr. Frances Furbish 4/25 and underwent HST 5/13 which showed moderate OSA with total AHI of 28.1/h and O2 nadir of 81%.  Recommend initiating AutoPap which was started on 6/26.  Review of compliance report as below with excellent usage although residual AHI 11.5.   (See prior visit report for updated compliance report)Reports difficulty tolerating as she feels more tired now than before she started using.  Feels like she wakes up with eyes being puffy and increased on her face with use of fullface mask, changed mask padding but no benefit. Unable to use nasal mask as she is a mouth breather without any benefit from chinstrap.  She also reports feeling bloated in the morning and even throughout the whole day.  At times feel like she is not getting enough air.  Epworth Sleepiness Scale 11/24.  Fatigue severity scale 52/63.   Update 09/07/2021 JM: Patient returns for 26-month stroke follow-up.  Overall stable without new stroke/TIA symptoms.  Believes great improvement of right hand - believes back to baseline. Does mention intermittent right foot numbness, band sensation around mid foot, occasionally into toe, worse when laying down. Present over the past couple of months.  Does have chronic lower back pain which can fluctuate, bilateral knee pain and diffuse arthritis.  Compliant on aspirin and simvastatin, denies side effects.  Blood pressure today 124/74.  Routinely monitors at home and typically stable.  Completed HST 5/13 which showed moderate OSA and currently awaiting CPAP machine.  Completed  repeat MRI which showed expected evolution of ICH and no new or underlying contributing factors not previously seen.  No further concerns at this time.  Initial visit 03/30/2021 JM: Patient being seen for initial hospital follow-up unaccompanied.  Overall doing well.  Currently working with OT with only minimal weakness on RUE.Still some fatigue and activity  intolerance more so than baseline.  Does admit to nocturia every 2-3 hours and snoring. Has not previously been tested for sleep apnea.  Completed 30-day cardiac event monitor which did not show evidence of A. fib.  Has follow-up with cardiology on 1/9 to review results.  Compliant on aspirin and simvastatin without side effects.  Blood pressures today 117/80 -routinely monitors at home and typically 110-120s/70-80s.  No further concerns at this time  Stroke admission 02/13/2021 [name] is a 72 y.o. female with history of HTN and stroke who presented on 02/13/2021 with right sided weakness and headache.  Personally reviewed hospitalization pertinent progress notes, lab work and imaging.  Evaluated by Dr. Pearlean Brownie for left basal ganglia hemorrhagic infarct with indeterminate etiology, low suspicion hypertensive related as blood pressure not significantly elevated and on the low end of normal and MRI showed remote age right occipital hemorrhagic infarct as well as nonhemorrhagic right superior temporal and left cerebellar embolic infarcts.  EF 65 to 70%.  TEE unremarkable.  Recommended 30-day cardiac event monitor to evaluate for possible A. fib.  LDL 62.  A1c 5.3.  On aspirin 81 mg BID PTA and recommended aspirin 81 mg daily and continuation of simvastatin.  Home HTN meds held in setting of low and BP and recommended restarting when appropriate. Residual RUE weakness with mild right-sided ataxia.  Therapies recommended outpatient therapies and d/c'd home on 11/8       PERTINENT IMAGING  Per recent hospitalization CT head  IPH in left basal ganglia with mild edema but no mass effect  MRI  Hemorrhage at left lentiform nucleus, chronic hemorrhage in encephalomalacia at right occipital pole 2D Echo EF 65-70%. No atrial shunt  EKG SR w PAC's TEE EF 60 to 65% without evidence of thrombus or PFO LDL 62 HgbA1c 5.3    ROS:   14 system review of systems performed and negative with exception of  those listed in HPI  PMH:  Past Medical History:  Diagnosis Date   Arthritis    back, fingers with joint pain and swelling.  chronic back pain   Cataract    Chronic back pain    arthritis    Diverticulosis    Elevated cholesterol    takes Niacin daily and Simvastatin   GERD (gastroesophageal reflux disease) 06/2004   non-specific gastritis on EGD 06/2004   Headache(784.0)    occasionally   History of colon polyps 2004, 2009   2004:adenomatous. 2009 hyperplastic.    History of hiatal hernia    Hypertension    takes Tenoretic and Lisinopril daily   Mucoid cyst of joint 08/2013   right index finger   Neuromuscular disorder (HCC)    numbness in right leg after knee replacement   Osteopenia 01/2014   T score -1.1 FRAX 14%/0.5%. Stable from prior DEXA   PONV (postoperative nausea and vomiting)    Rotator cuff arthropathy    Left   Seasonal allergies    Sleep apnea    wears CPAP nightly   Stroke (HCC) 1998   x 2 - mild left-sided weakness   Urge incontinence    Uterine prolapse  PSH:  Past Surgical History:  Procedure Laterality Date   BACK SURGERY     x 2   BELPHAROPTOSIS REPAIR Bilateral 12/14/2017   BUBBLE STUDY  02/16/2021   Procedure: BUBBLE STUDY;  Surgeon: Vesta Mixer, MD;  Location: [facility] ENDOSCOPY;  Service: Cardiovascular;;   CARPAL TUNNEL RELEASE Bilateral    Dr August Saucer   CATARACT EXTRACTION     CATARACT EXTRACTION Bilateral 10/2017   COLONOSCOPY  2004, 2009, 2014   FINGER SURGERY     GUM SURGERY     GYNECOLOGIC CRYOSURGERY     HYSTEROSCOPY WITH D & C  12/07/2010   with resection of endometrial polyp   JOINT REPLACEMENT     KNEE ARTHROSCOPY Left 2008   lip biopsy     done at MD office Fri 11/22/13   MASS EXCISION Right 08/15/2013   Procedure: RIGHT INDEX EXCISION MASS ;  Surgeon: Tami Ribas, MD;  Location: [facility];  Service: Orthopedics;  Laterality: Right;   NASAL SEPTUM SURGERY     OOPHORECTOMY Right 2000   REVERSE SHOULDER  ARTHROPLASTY Left 12/11/2018   REVERSE SHOULDER ARTHROPLASTY Left 12/11/2018   Procedure: LEFT REVERSE SHOULDER ARTHROPLASTY;  Surgeon: Cammy Copa, MD;  Location: [facility] OR;  Service: Orthopedics;  Laterality: Left;   SHOULDER ARTHROSCOPY WITH OPEN ROTATOR CUFF REPAIR AND DISTAL CLAVICLE ACROMINECTOMY Right 11/26/2013   Procedure: RIGHT SHOULDER ARTHROSCOPY WITH MINI OPEN ROTATOR CUFF REPAIR AND DISTAL CLAVICLE RESECTION, SUBACROMIAL DECOMPRESSION, POSSIBLE [facility] PATCH.;  Surgeon: Valeria Batman, MD;  Location: [facility] OR;  Service: Orthopedics;  Laterality: Right;   TEE WITHOUT CARDIOVERSION N/A 02/16/2021   Procedure: TRANSESOPHAGEAL ECHOCARDIOGRAM (TEE);  Surgeon: Elease Hashimoto Deloris Ping, MD;  Location: [facility] ENDOSCOPY;  Service: Cardiovascular;  Laterality: N/A;   TOTAL KNEE ARTHROPLASTY Right 03/21/2017   TOTAL KNEE ARTHROPLASTY Right 03/21/2017   Procedure: RIGHT TOTAL KNEE ARTHROPLASTY;  Surgeon: Valeria Batman, MD;  Location: [facility] OR;  Service: Orthopedics;  Laterality: Right;   TOTAL KNEE ARTHROPLASTY Left 11/29/2022   Procedure: LEFT TOTAL KNEE ARTHROPLASTY;  Surgeon: Cammy Copa, MD;  Location: [facility] ORS;  Service: Orthopedics;  Laterality: Left;   TUBAL LIGATION      Social History:  Social History   Socioeconomic History   Marital status: Married    Spouse name: Not on file   Number of children: 2   Years of education: 12   Highest education level: Not on file  Occupational History   Occupation: Forensic scientist - Human Resources-Retired    Comment: Disability/Retired  Tobacco Use   Smoking status: Never    Passive exposure: Yes   Smokeless tobacco: Never  Vaping Use   Vaping status: Never Used  Substance and Sexual Activity   Alcohol use: Not Currently    Alcohol/week: 0.0 standard drinks of alcohol    Comment: about 2-3 times a year   Drug use: No   Sexual activity: Yes    Partners: Male    Birth control/protection: Surgical, Post-menopausal    Comment: BTL-1st  intercourse 72 yo-More than 5 partners  Other Topics Concern   Not on file  Social History Narrative   Glessie grew up in [place], [place]. She lives with her husband Karen Kitchens). She has 1 daughter Efraim Kaufmann), 1 son Lenard Galloway) and 2 stepchildren (Benny & Gilda Crease). They have 3 dogs ([name], [place], [address]) and 3 cats ([place], [place], [place]). She enjoys watching movies and outdoors activities.Exercise - noneCaffeine - varies depending on the day. May have between  1-4 cups daily.  Rare Soda.    Social Determinants of Health   Financial Resource Strain: Low Risk  (11/09/2022)   Overall Financial Resource Strain (CARDIA)    Difficulty of Paying Living Expenses: Not hard at all  Food Insecurity: No Food Insecurity (12/05/2022)   Hunger Vital Sign    Worried About Running Out of Food in the Last Year: Never true    Ran Out of Food in the Last Year: Never true  Transportation Needs: No Transportation Needs (12/05/2022)   PRAPARE - Administrator, Civil Service (Medical): No    Lack of Transportation (Non-Medical): No  Physical Activity: Inactive (11/09/2022)   Exercise Vital Sign    Days of Exercise per Week: 0 days    Minutes of Exercise per Session: 0 min  Stress: No Stress Concern Present (11/09/2022)   [employer] of Occupational Health - Occupational Stress Questionnaire    Feeling of Stress : Not at all  Social Connections: Moderately Isolated (11/09/2022)   Social Connection and Isolation Panel [NHANES]    Frequency of Communication with Friends and Family: Three times a week    Frequency of Social Gatherings with Friends and Family: Three times a week    Attends Religious Services: Never    Active Member of Clubs or Organizations: No    Attends Banker Meetings: Never    Marital Status: Married  Catering manager Violence: Not At Risk (11/29/2022)   Humiliation, Afraid, Rape, and Kick questionnaire    Fear of Current or Ex-Partner: No    Emotionally Abused: No     Physically Abused: No    Sexually Abused: No    Family History:  Family History  Problem Relation Age of Onset   Hypertension Mother    Heart disease Mother    Diabetes Father    Hypertension Father    Heart disease Father    Stroke Father    Diabetes Maternal Aunt    Diabetes Paternal Grandmother    Hypertension Paternal Grandfather    Stroke Paternal Grandfather    Colon cancer Neg Hx    Esophageal cancer Neg Hx    Rectal cancer Neg Hx    Stomach cancer Neg Hx    Sleep apnea Neg Hx     Medications:   Current Outpatient Medications on File Prior to Visit  Medication Sig Dispense Refill   acetaminophen (TYLENOL) 500 MG tablet Take 1,000-1,500 mg by mouth every 8 (eight) hours as needed for moderate pain.     ALPHA LIPOIC ACID PO Take 1,400 mg by mouth daily.     Ascorbic Acid (VITAMIN C) 1000 MG tablet Take 1,000 mg by mouth daily. With zinc     aspirin 81 MG chewable tablet Chew 1 tablet (81 mg total) by mouth 2 (two) times daily. 60 tablet 0   atenolol-chlorthalidone (TENORETIC) 50-25 MG tablet Take 1 tablet by mouth daily. 90 tablet 1   b complex vitamins tablet Take 1 tablet by mouth at bedtime.     calcium carbonate (OSCAL) 1500 (600 Ca) MG TABS tablet Take 600 mg of elemental calcium by mouth daily.     Cholecalciferol (D 5000) 5000 units capsule Take 5,000 Units by mouth daily.     Coenzyme Q10 (COQ10) 100 MG CAPS Take 100 mg by mouth every evening.      diclofenac Sodium (VOLTAREN) 1 % GEL Apply 1 Application topically 2 (two) times daily.     diphenoxylate-atropine (LOMOTIL) 2.5-0.025 MG tablet  TAKE 1 TABLET BY MOUTH FOUR TIMES A DAY AS NEEDED FOR DIARRHEA OR LOOSE STOOLS (Patient taking differently: Take 1 tablet by mouth 4 (four) times daily as needed for diarrhea or loose stools.) 80 tablet 2   ELDERBERRY PO Take 250 mg by mouth daily.     folic acid (FOLVITE) 800 MCG tablet Take 800 mcg by mouth every evening.      Glutathione 500 MG CAPS Take 500 mg by mouth  daily.     ketotifen (ZADITOR) 0.025 % ophthalmic solution Place 2 drops into both eyes 2 (two) times daily as needed (allergies).     KLOR-CON M20 20 MEQ tablet TAKE 1 TABLET TWICE A DAY 180 tablet 1   loperamide (IMODIUM A-D) 2 MG tablet Take 2-4 mg by mouth 4 (four) times daily as needed for diarrhea or loose stools.     Melatonin 3 MG TABS Take 3 mg by mouth at bedtime.      METAMUCIL FIBER PO Take 2 tablets by mouth daily as needed (constipation).     methocarbamol (ROBAXIN) 500 MG tablet TAKE 1 TABLET BY MOUTH EVERY 8 HOURS AS NEEDED FOR MUSCLE SPASMS (Patient taking differently: Take 500 mg by mouth every 8 (eight) hours as needed for muscle spasms.) 90 tablet 3   Multiple Vitamin (MULTIVITAMIN WITH MINERALS) TABS tablet Take 1 tablet by mouth at bedtime.     Multiple Vitamins-Minerals (ZINC PO) Take 1 Dose by mouth daily. 1 dose = 1 dropper full of liquid zinc     OVER THE COUNTER MEDICATION Take 1 Dose by mouth at bedtime. Lemon Balm     OVER THE COUNTER MEDICATION Take 1 Dose by mouth at bedtime. Cats Claw liquid daily     OVER THE COUNTER MEDICATION Apply 1 Application topically at bedtime. Magnesium lotion     saccharomyces boulardii (FLORASTOR) 250 MG capsule Take 250 mg by mouth daily.     simvastatin (ZOCOR) 20 MG tablet Take 1 tablet (20 mg total) by mouth daily. 90 tablet 1   Sodium Fluoride (SODIUM FLUORIDE 5000 PPM) 1.1 % PSTE Place 1 application onto teeth at bedtime.     traZODone (DESYREL) 50 MG tablet Take 1 tablet (50 mg total) by mouth at bedtime. 25 tablet 0   TURMERIC CURCUMIN PO Take 630 mg by mouth daily.     amoxicillin (AMOXIL) 500 MG capsule 2g 1 hour prior to dental procedure (Patient not taking: Reported on 02/20/2023) 10 capsule 0   No current facility-administered medications on file prior to visit.    Allergies:   Allergies  Allergen Reactions   Egg-Derived Products Other (See Comments)    GI UPSET, JOINT PAIN, DIFF. SWALLOWING   Etodolac Other (See  Comments)    ABD. CRAMPING   Haley-Derived Products Other (See Comments)    GI UPSET, JOINT PAIN, DIFF. SWALLOWING    Omeprazole Other (See Comments)    ABD. CRAMPING   Pineapple Other (See Comments)    GI UPSET, JOINT PAIN, DIFF. SWALLOWING   Gluten Meal Diarrhea    GI upset      OBJECTIVE:  Physical Exam  Vitals:   02/20/23 1517  BP: 130/74  Pulse: 82  Weight: 163 lb (73.9 kg)  Height: 5\' 2"  (1.575 m)   Body mass index is 29.81 kg/m. No results found.  General: well developed, well nourished, very pleasant elderly Caucasian female, seated, in no evident distress  Neurologic Exam Mental Status: Awake and fully alert.  Fluent speech and language.  Oriented to place and time. Recent and remote memory intact. Attention span, concentration and fund of knowledge appropriate. Mood and affect appropriate.  Cranial Nerves: Pupils equal, briskly reactive to light. Extraocular movements full without nystagmus. Visual fields full to confrontation. Hearing intact. Facial sensation intact. Face, tongue, palate moves normally and symmetrically.  Motor: Normal bulk and tone. Normal strength in all tested extremity muscles Coordination: Rapid alternating movements normal in all extremities. Finger-to-nose and heel-to-shin performed accurately bilaterally. Gait and Station: Arises from chair without difficulty. Stance is normal. Gait demonstrates normal stride length and balance with use of cane.  Reflexes: 1+ and symmetric. Toes downgoing.         ASSESSMENT: [name] is a 72 y.o. year old female with hx of left BG hemorrhagic stroke on 02/13/2021 secondary to unknown source as well as evidence of remote age right occipital hemorrhagic infarct and nonhemorrhagic right superior temporal and left cerebellar embolic infarcts noted on imaging. Vascular risk factors include HTN, HLD, advanced age, prior strokes on imaging and diagnosis of moderate OSA in 08/2021 with initiation of  AutoPap.      PLAN:  OSA on CPAP:  100% compliance with optimal residual AHI although persistent difficulty tolerating Continue current pressure settings of 6-16 with EPR level 3 Prior elevated AHI on pressure setting of 6-12 and 6-15 She questions use of other treatment options such as inspire device or oral appliance. Will further discuss with Dr. Frances Furbish and will notify patient.  Discussed continued nightly compliance ensuring greater than 4 hours per night for optimal benefit and per insurance requirements Continue to follow with DME for any needed supplies or CPAP related concerns  L BG hemorrhagic stroke:  Stable. Continue current medications and close follow-up with PCP for aggressive stroke risk factor management    Follow-up in 1 year or call earlier if needed   CC:  PCP: Myrlene Broker, MD    I spent 25 minutes of face-to-face and non-face-to-face time with patient.  This included previsit chart review, lab review, study review, order entry, electronic health record documentation, patient education regarding above diagnoses and answered all other questions to patient satisfaction   Ihor Austin, [facility]  [facility] [address] [place], [place] [address]  Phone [phone] Fax [phone] Note: This document was prepared with digital dictation and possible smart phrase technology. Any transcriptional errors that result from this process are unintentional.

## 2023-02-21 ENCOUNTER — Ambulatory Visit: Payer: Medicare Other | Admitting: Physical Therapy

## 2023-02-23 ENCOUNTER — Encounter: Payer: Self-pay | Admitting: Adult Health

## 2023-02-23 ENCOUNTER — Ambulatory Visit: Payer: Medicare Other | Admitting: Physical Therapy

## 2023-02-24 ENCOUNTER — Other Ambulatory Visit (INDEPENDENT_AMBULATORY_CARE_PROVIDER_SITE_OTHER): Payer: Medicare Other

## 2023-02-24 ENCOUNTER — Ambulatory Visit (INDEPENDENT_AMBULATORY_CARE_PROVIDER_SITE_OTHER): Payer: Medicare Other | Admitting: Orthopedic Surgery

## 2023-02-24 ENCOUNTER — Telehealth: Payer: Self-pay

## 2023-02-24 DIAGNOSIS — G8929 Other chronic pain: Secondary | ICD-10-CM

## 2023-02-24 DIAGNOSIS — M545 Low back pain, unspecified: Secondary | ICD-10-CM

## 2023-02-24 DIAGNOSIS — M19031 Primary osteoarthritis, right wrist: Secondary | ICD-10-CM | POA: Diagnosis not present

## 2023-02-24 DIAGNOSIS — M19041 Primary osteoarthritis, right hand: Secondary | ICD-10-CM | POA: Diagnosis not present

## 2023-02-24 DIAGNOSIS — S52571A Other intraarticular fracture of lower end of right radius, initial encounter for closed fracture: Secondary | ICD-10-CM | POA: Diagnosis not present

## 2023-02-25 ENCOUNTER — Encounter: Payer: Self-pay | Admitting: Orthopedic Surgery

## 2023-02-25 NOTE — Progress Notes (Signed)
Office Visit Note   Patient: [name]           Date of Birth: 09-14-1950           MRN: [id number] Visit Date: 02/24/2023 Requested by: Myrlene Broker, MD [address] [place],  [place] [address] PCP: Myrlene Broker, MD  Subjective: Chief Complaint  Patient presents with   Left Knee - Follow-up   Right Wrist - Follow-up    HPI: [name] is a 72 y.o. female who presents to the office reporting decreasing pain from right wrist fracture.  She has seen another hand surgeon for evaluation of the right wrist and they have recommended occupational therapy which has been arranged.  Patient also states she is doing reasonly well from her left total knee replacement.  Patient also states she is on vitamin D.  She also reports right sided low back pain as well as foot numbness on the plantar aspect.  Has a known history of lumbar spine surgery about 15 to 20 years ago which was not a fusion..                ROS: All systems reviewed are negative as they relate to the chief complaint within the history of present illness.  Patient denies fevers or chills.  Assessment & Plan: Visit Diagnoses:  1. Chronic midline low back pain, unspecified whether sciatica present     Plan: Impression is well-functioning knee replacement with range of motion of 5-1 10.  Continue with physical therapy for that 1 times a week for several more weeks transitioning to a home exercise program.  Regarding the wrist she is going to do occupational therapy and she is wearing a splint.  This should heal uneventfully.  She is taking vitamin D for that.  Regarding the lumbar spine she has had symptoms now for over 6 weeks and has failed conservative management including physical therapy as well as activity modification as well as medication.  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  MRI scan indicated with it  epidural steroid injections to follow.  Follow-Up Instructions: No follow-ups on file.   Orders:  Orders Placed This Encounter  Procedures   XR Lumbar Spine 2-3 Views   MR Lumbar Spine w/o contrast   No orders of the defined types were placed in this encounter.     Procedures: No procedures performed   Clinical Data: No additional findings.  Objective: Vital Signs: LMP 02/09/2005   Physical Exam:  Constitutional: Patient appears well-developed HEENT:  Head: Normocephalic Eyes:EOM are normal Neck: Normal range of motion Cardiovascular: Normal rate Pulmonary/chest: Effort normal Neurologic: Patient is alert Skin: Skin is warm Psychiatric: Patient has normal mood and affect  Ortho Exam: Ortho exam demonstrates range of motion on the right knee of 5-1 10.  No effusion in the knee.  She has no nerve root tension signs and very good ankle dorsiflexion plantarflexion quad hamstring strength.  No paresthesias on the left but she does have some plantar paresthesias on the right with palpable pedal pulses.  Reflexes symmetric 0 to 1+ out of 4 bilateral patella and Achilles.  Negative clonus.  No groin pain with internal/external rotation of either leg.   Specialty Comments:  No specialty comments available.  Imaging: No results found.   PMFS History: Patient Active Problem List   Diagnosis Date Noted   S/P total knee replacement, left  11/29/2022   Urinary incontinence 03/25/2022   ICH (intracerebral hemorrhage) (HCC) 02/13/2021   Iron deficiency 10/07/2020   Aortic atherosclerosis (HCC) 03/17/2020   Lung nodule seen on imaging study 02/24/2020   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   Chronic left shoulder pain 08/23/2018   Arthritis of left knee 11/02/2016   Chronic diarrhea 12/07/2014   Segmental colitis with rectal bleeding (HCC) 09/12/2014   Routine general medical examination at a health care facility 06/05/2014   Overweight 06/13/2011   Hyperlipidemia  02/17/2007   Essential hypertension 01/08/2007   Osteoarthritis 01/08/2007   Past Medical History:  Diagnosis Date   Arthritis    back, fingers with joint pain and swelling.  chronic back pain   Cataract    Chronic back pain    arthritis    Diverticulosis    Elevated cholesterol    takes Niacin daily and Simvastatin   GERD (gastroesophageal reflux disease) 06/2004   non-specific gastritis on EGD 06/2004   Headache(784.0)    occasionally   History of colon polyps 2004, 2009   2004:adenomatous. 2009 hyperplastic.    History of hiatal hernia    Hypertension    takes Tenoretic and Lisinopril daily   Mucoid cyst of joint 08/2013   right index finger   Neuromuscular disorder (HCC)    numbness in right leg after knee replacement   Osteopenia 01/2014   T score -1.1 FRAX 14%/0.5%. Stable from prior DEXA   PONV (postoperative nausea and vomiting)    Rotator cuff arthropathy    Left   Seasonal allergies    Sleep apnea    wears CPAP nightly   Stroke (HCC) 1998   x 2 - mild left-sided weakness   Urge incontinence    Uterine prolapse     Family History  Problem Relation Age of Onset   Hypertension Mother    Heart disease Mother    Diabetes Father    Hypertension Father    Heart disease Father    Stroke Father    Diabetes Maternal Aunt    Diabetes Paternal Grandmother    Hypertension Paternal Grandfather    Stroke Paternal Grandfather    Colon cancer Neg Hx    Esophageal cancer Neg Hx    Rectal cancer Neg Hx    Stomach cancer Neg Hx    Sleep apnea Neg Hx     Past Surgical History:  Procedure Laterality Date   BACK SURGERY     x 2   BELPHAROPTOSIS REPAIR Bilateral 12/14/2017   BUBBLE STUDY  02/16/2021   Procedure: BUBBLE STUDY;  Surgeon: Vesta Mixer, MD;  Location: [facility];  Service: Cardiovascular;;   CARPAL TUNNEL RELEASE Bilateral    Dr August Saucer   CATARACT EXTRACTION     CATARACT EXTRACTION Bilateral 10/2017   COLONOSCOPY  2004, 2009, 2014   FINGER  SURGERY     GUM SURGERY     GYNECOLOGIC CRYOSURGERY     HYSTEROSCOPY WITH D & C  12/07/2010   with resection of endometrial polyp   JOINT REPLACEMENT     KNEE ARTHROSCOPY Left 2008   lip biopsy     done at MD office Fri 11/22/13   MASS EXCISION Right 08/15/2013   Procedure: RIGHT INDEX EXCISION MASS ;  Surgeon: Tami Ribas, MD;  Location: [facility];  Service: Orthopedics;  Laterality: Right;   NASAL SEPTUM SURGERY     OOPHORECTOMY Right 2000   REVERSE SHOULDER ARTHROPLASTY Left 12/11/2018   REVERSE  SHOULDER ARTHROPLASTY Left 12/11/2018   Procedure: LEFT REVERSE SHOULDER ARTHROPLASTY;  Surgeon: Cammy Copa, MD;  Location: [facility] OR;  Service: Orthopedics;  Laterality: Left;   SHOULDER ARTHROSCOPY WITH OPEN ROTATOR CUFF REPAIR AND DISTAL CLAVICLE ACROMINECTOMY Right 11/26/2013   Procedure: RIGHT SHOULDER ARTHROSCOPY WITH MINI OPEN ROTATOR CUFF REPAIR AND DISTAL CLAVICLE RESECTION, SUBACROMIAL DECOMPRESSION, POSSIBLE [facility] PATCH.;  Surgeon: Valeria Batman, MD;  Location: [facility] OR;  Service: Orthopedics;  Laterality: Right;   TEE WITHOUT CARDIOVERSION N/A 02/16/2021   Procedure: TRANSESOPHAGEAL ECHOCARDIOGRAM (TEE);  Surgeon: Elease Hashimoto Deloris Ping, MD;  Location: [facility];  Service: Cardiovascular;  Laterality: N/A;   TOTAL KNEE ARTHROPLASTY Right 03/21/2017   TOTAL KNEE ARTHROPLASTY Right 03/21/2017   Procedure: RIGHT TOTAL KNEE ARTHROPLASTY;  Surgeon: Valeria Batman, MD;  Location: [facility];  Service: Orthopedics;  Laterality: Right;   TOTAL KNEE ARTHROPLASTY Left 11/29/2022   Procedure: LEFT TOTAL KNEE ARTHROPLASTY;  Surgeon: Cammy Copa, MD;  Location: [facility];  Service: Orthopedics;  Laterality: Left;   TUBAL LIGATION     Social History   Occupational History   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

## 2023-02-27 ENCOUNTER — Ambulatory Visit: Payer: Medicare Other | Admitting: Adult Health

## 2023-02-28 ENCOUNTER — Encounter: Payer: Medicare Other | Admitting: Physical Therapy

## 2023-03-02 ENCOUNTER — Encounter: Payer: Medicare Other | Admitting: Physical Therapy

## 2023-03-06 ENCOUNTER — Ambulatory Visit: Payer: Medicare Other | Admitting: Orthopedic Surgery

## 2023-03-07 ENCOUNTER — Ambulatory Visit: Payer: Medicare Other | Admitting: Physical Therapy

## 2023-03-07 ENCOUNTER — Encounter: Payer: Self-pay | Admitting: Physical Therapy

## 2023-03-07 DIAGNOSIS — M6281 Muscle weakness (generalized): Secondary | ICD-10-CM | POA: Diagnosis not present

## 2023-03-07 DIAGNOSIS — M25562 Pain in left knee: Secondary | ICD-10-CM | POA: Diagnosis not present

## 2023-03-07 DIAGNOSIS — M25662 Stiffness of left knee, not elsewhere classified: Secondary | ICD-10-CM

## 2023-03-07 DIAGNOSIS — R2681 Unsteadiness on feet: Secondary | ICD-10-CM | POA: Diagnosis not present

## 2023-03-07 DIAGNOSIS — Z96652 Presence of left artificial knee joint: Secondary | ICD-10-CM

## 2023-03-07 DIAGNOSIS — R269 Unspecified abnormalities of gait and mobility: Secondary | ICD-10-CM | POA: Diagnosis not present

## 2023-03-07 NOTE — Therapy (Signed)
OUTPATIENT PHYSICAL THERAPY LOWER EXTREMITY TREATMENT/ DISCHARGE  Patient Name: [name] MRN: [id number] DOB:07/31/1950, 72 y.o., female Today's Date: 03/07/2023   PT End of Session - 03/07/23 1353     Visit Number 18    Number of Visits 21    Date for PT Re-Evaluation 03/15/23    PT Start Time 1344    Activity Tolerance Patient tolerated treatment well    Behavior During Therapy [facility] for tasks assessed/performed             Past Medical History:  Diagnosis Date   Arthritis    back, fingers with joint pain and swelling.  chronic back pain   Cataract    Chronic back pain    arthritis    Diverticulosis    Elevated cholesterol    takes Niacin daily and Simvastatin   GERD (gastroesophageal reflux disease) 06/2004   non-specific gastritis on EGD 06/2004   Headache(784.0)    occasionally   History of colon polyps 2004, 2009   2004:adenomatous. 2009 hyperplastic.    History of hiatal hernia    Hypertension    takes Tenoretic and Lisinopril daily   Mucoid cyst of joint 08/2013   right index finger   Neuromuscular disorder (HCC)    numbness in right leg after knee replacement   Osteopenia 01/2014   T score -1.1 FRAX 14%/0.5%. Stable from prior DEXA   PONV (postoperative nausea and vomiting)    Rotator cuff arthropathy    Left   Seasonal allergies    Sleep apnea    wears CPAP nightly   Stroke (HCC) 1998   x 2 - mild left-sided weakness   Urge incontinence    Uterine prolapse    Past Surgical History:  Procedure Laterality Date   BACK SURGERY     x 2   BELPHAROPTOSIS REPAIR Bilateral 12/14/2017   BUBBLE STUDY  02/16/2021   Procedure: BUBBLE STUDY;  Surgeon: Vesta Mixer, MD;  Location: [facility];  Service: Cardiovascular;;   CARPAL TUNNEL RELEASE Bilateral    Dr August Saucer   CATARACT EXTRACTION     CATARACT EXTRACTION Bilateral 10/2017   COLONOSCOPY  2004, 2009, 2014   FINGER SURGERY     GUM SURGERY     GYNECOLOGIC CRYOSURGERY     HYSTEROSCOPY WITH  D & C  12/07/2010   with resection of endometrial polyp   JOINT REPLACEMENT     KNEE ARTHROSCOPY Left 2008   lip biopsy     done at MD office Fri 11/22/13   MASS EXCISION Right 08/15/2013   Procedure: RIGHT INDEX EXCISION MASS ;  Surgeon: Tami Ribas, MD;  Location: [facility];  Service: Orthopedics;  Laterality: Right;   NASAL SEPTUM SURGERY     OOPHORECTOMY Right 2000   REVERSE SHOULDER ARTHROPLASTY Left 12/11/2018   REVERSE SHOULDER ARTHROPLASTY Left 12/11/2018   Procedure: LEFT REVERSE SHOULDER ARTHROPLASTY;  Surgeon: Cammy Copa, MD;  Location: [facility] OR;  Service: Orthopedics;  Laterality: Left;   SHOULDER ARTHROSCOPY WITH OPEN ROTATOR CUFF REPAIR AND DISTAL CLAVICLE ACROMINECTOMY Right 11/26/2013   Procedure: RIGHT SHOULDER ARTHROSCOPY WITH MINI OPEN ROTATOR CUFF REPAIR AND DISTAL CLAVICLE RESECTION, SUBACROMIAL DECOMPRESSION, POSSIBLE [facility] PATCH.;  Surgeon: Valeria Batman, MD;  Location: [facility];  Service: Orthopedics;  Laterality: Right;   TEE WITHOUT CARDIOVERSION N/A 02/16/2021   Procedure: TRANSESOPHAGEAL ECHOCARDIOGRAM (TEE);  Surgeon: Elease Hashimoto Deloris Ping, MD;  Location: [facility] ENDOSCOPY;  Service: Cardiovascular;  Laterality: N/A;   TOTAL KNEE  ARTHROPLASTY Right 03/21/2017   TOTAL KNEE ARTHROPLASTY Right 03/21/2017   Procedure: RIGHT TOTAL KNEE ARTHROPLASTY;  Surgeon: Valeria Batman, MD;  Location: [facility];  Service: Orthopedics;  Laterality: Right;   TOTAL KNEE ARTHROPLASTY Left 11/29/2022   Procedure: LEFT TOTAL KNEE ARTHROPLASTY;  Surgeon: Cammy Copa, MD;  Location: [facility];  Service: Orthopedics;  Laterality: Left;   TUBAL LIGATION     Patient Active Problem List   Diagnosis Date Noted   S/P total knee replacement, left 11/29/2022   Urinary incontinence 03/25/2022   ICH (intracerebral hemorrhage) (HCC) 02/13/2021   Iron deficiency 10/07/2020   Aortic atherosclerosis (HCC) 03/17/2020   Lung nodule seen on imaging study 02/24/2020   OA  (osteoarthritis) of shoulder 12/11/2018   Chronic left shoulder pain 08/23/2018   Arthritis of left knee 11/02/2016   Chronic diarrhea 12/07/2014   Segmental colitis with rectal bleeding (HCC) 09/12/2014   Routine general medical examination at a health care facility 06/05/2014   Overweight 06/13/2011   Hyperlipidemia 02/17/2007   Essential hypertension 01/08/2007   Osteoarthritis 01/08/2007    PCP: Myrlene Broker, MD  REFERRING PROVIDER: Cammy Copa, MD  REFERRING DIAG: S/P total knee replacement, left  THERAPY DIAG:  Total knee replacement status, left  Stiffness of left knee, not elsewhere classified  Muscle weakness (generalized)  Unsteadiness on feet  Rationale for Evaluation and Treatment: Rehabilitation  ONSET DATE: 11/29/2022  SUBJECTIVE:   SUBJECTIVE STATEMENT: Pt reports to PT s/p L TKA on 11/29/2022. She reports falling in hospital during her stay which also resulted in her hurting her R wrist; pt wearing a wrist brace upon arrival and using RW with armrest for R UE. Pt has had HHPT coming to visit 2-3 times a week since discharging from the hospital. Pt also reports mild numbness/tingling in the medial/lateral aspects of her L knee, as well as completely down her R leg from the knee down. Pt has hx of R TKA ~6 years ago.  PERTINENT HISTORY: Hx of R TKA (~6 years ago), reverse TSA on LUE, HTN PAIN:  Are you having pain? Yes: NPRS scale: 3/10 now/best, 8/10 at worst Pain location: L knee Aggravating factors: activity, exercise Relieving factors: Ice (polar care), tylenol, pt reports taking oxy at night PRN to help sleep  PRECAUTIONS: None  RED FLAGS: None   WEIGHT BEARING RESTRICTIONS: No  FALLS:  Has patient fallen in last 6 months? Yes. Number of falls 1  LIVING ENVIRONMENT: Lives with: lives with their spouse Lives in: House/apartment Stairs: Yes: Internal: 1 flight of steps (doesn't need to use); External: 2 steps, rail on R going  up Has following equipment at home: Single point cane and Walker - 4 wheeled  PLOF: Independent  PATIENT GOALS: Get back to being able to take care of/walk dogs, get back to walking without AD  NEXT MD VISIT: TBD  OBJECTIVE:   DIAGNOSTIC FINDINGS:  XR Wrist Complete Right:  PA, oblique, lateral, navicular views of right wrist reviewed.  No  fracture or dislocation noted.  Severe degenerative changes noted of the first [facility] joint with mild degenerative changes of the radiocarpal joint and STT joint.  Mild to moderate degenerative changes of the first MCP and IP joints.   PATIENT SURVEYS:  FOTO 43/54  COGNITION: Overall cognitive status: Within functional limits for tasks assessed     SENSATION: Not formally tested however pt reports numbness down R LE ever since R TKA ~6 years ago  EDEMA:  Circumferential  measurements:  - Right: Joint line = 45 cm, 2 inches superior to patella = 47 cm, 2 inches distal to patella = 40.5 cm  - Left: Joint line = 44 cm, 2 inches superior to patella = 49.5 cm, 2 inches distal to patella = 42 cm  POSTURE: flexed trunk   PALPATION: L knee incision site examined, no excess redness or warmth noted; mild swelling still present.   LOWER EXTREMITY ROM/:  R knee AROM: 0-102 degrees (in supine)  L knee AROM: 0-89 degrees (in supine); pt able to achieve 97 degrees active knee flexion in sitting   JOINT MOBILITY ASSESSMENTS:  L Tibiofemoral Joint:   - A/P and P/A glides hypomobile with tight end feel   - Medial/Lateral glides WNL   - IR hypomobile with soft end feel   - ER hypomobile with soft end feel, painful   -Patellar glides me/lat/sup/inf     LOWER EXTREMITY MMT:  MMT Right eval Left eval  Hip flexion 3+ (in supine) 3+ (in supine)  Hip extension 3 3-  Hip abduction 3 3  Hip adduction 3 3  Hip internal rotation    Hip external rotation    Knee flexion 3+   Knee extension    Ankle dorsiflexion    Ankle plantarflexion    Ankle  inversion    Ankle eversion    Knee strength testing deferred on L side secondary to pain  FUNCTIONAL TESTS:  30 seconds chair stand test: 7 reps Timed up and go (TUG): 20 seconds, no AD  BALANCE TESTS: SLS: 5-6 seconds bilaterally, increased instability with L side  10+ seconds tandem stance bilaterally  GAIT: Distance walked: 20 feet during TUG Assistive device utilized: None Level of assistance: Complete Independence Comments: Slow, cautious gait pattern and speed. Slow turns.    TODAY'S TREATMENT:                                                                                                                              DATE: 03/07/2023      Subjective: Patient arrives to treatment session with no AD; R wrist brace still donned.  Pt. reports 3/10 in terms of stiffness in her L knee. Pt. Went to beach last week and did well, but did not walk on sand. Pt. Has MRI scheduled in 2 weeks for low back to determine POC.       Therex:   Nustep L4 (seat position 7) x 10 mins for B LE strengthening and endurance.  Consistent cadence/ no increase c/o pain. Discussed week at beach.    : 775 feet with no assistive device  High knees/lateral sidesteps in // bars: 3 laps each (no ankle wts. Today).   Assessment of R lateral ankle/ tenderness over R lateral malleoli.  Pt. Will stop standing heel raises and instructed to ice L foot/heel.    High marching/ lateral walking 5x each in //-bars.   Supine R hip/knee manual  isometrics 6x with moderate resistance.    Supine L knee extension (0 deg.) and flexion (114 deg.)- stiffness/ discomfort.    Discussed HEP   PATIENT EDUCATION:  Education details: Pt educated on HEP completion and frequency, educated to avoid using lotion at incision site until scarring/stitches have healed to avoid infection Person educated: Patient Education method: Medical illustrator Education comprehension: verbalized understanding  HOME EXERCISE  PROGRAM: Access Code: [email or web address] URL: [email or web address] Date: 02/09/2023 Prepared by: Maylon Peppers  Exercises - Walking March  - 2-3 x daily - 5-7 x weekly - 3 sets - 10 reps - Sidestepping  - 2-3 x daily - 5-7 x weekly - 3 sets - 10 reps - Standing 3-Way Kick  - 2-3 x daily - 5-7 x weekly - 3 sets - 10 reps - Step Taps on High Step  - 2-3 x daily - 5-7 x weekly - 3 sets - 10 reps - Sit to Stand  - 2-3 x daily - 5-7 x weekly - 3 sets - 10 reps - Supine Bridge  - 2-3 x daily - 5-7 x weekly - 3 sets - 10 reps  ASSESSMENT:  CLINICAL IMPRESSION:     Pt. reports to PT with usual L knee discomfort/ stiffness.  Pt. Has shown marked increase in R hip/quad/hamstring strengthening.  No adverse responses or increased pain reported throughout the exercises today, just reports of muscular fatigue. PT reassessed goals.  Discharge from PT at this time with focus on independent walking/ HEP.  Pt. Instructed to contact PT if any questions or regression in symptoms.     OBJECTIVE IMPAIRMENTS: decreased activity tolerance, decreased balance, decreased endurance, difficulty walking, decreased ROM, decreased strength, and pain.   ACTIVITY LIMITATIONS: squatting, stairs, transfers, and locomotion level  PARTICIPATION LIMITATIONS: cleaning, laundry, community activity, and yard work  PERSONAL FACTORS: Age and 3+ comorbidities: Hx of R TKA, hx of reverse TSA on L UE, R wrist pain  are also affecting patient's functional outcome.   REHAB POTENTIAL: Good  CLINICAL DECISION MAKING: Stable/uncomplicated  EVALUATION COMPLEXITY: Low   GOALS:  LONG TERM GOALS: Target date: 03/15/2023  Pt will improve FOTO to at least 54 in order to show improved pain and function with ADL's and functional mobility. Baseline: Initial 43, 10/15: 47.  11/26: 66 Goal status: Goal met   2.  Pt will be able to improve rep count during 30 second sit-to-stand test to at least 14 in order to demonstrate improved  functional independence. Baseline: 7 reps, 10/15: 9 reps.  11/6: 11 reps Goal status: Partially met  3.  Pt will decrease TUG time to at least 11 seconds in order to be below cutoff for fall-risk. Baseline: 20 seconds, no AD; 10/15: 12.8 seconds with no AD.  11/6: 10.43 sec.  Goal status: Goal met  4.  Pt will demonstrate at least 4/5 MMT scores for hip strength bilaterally in order to show improvement in LE strength needed for improved mobility and independence. Baseline: See above for initial MMT scores, 10/15: see for updated MMT scores.  11/6: B hip flexion 4/5 MMT Goal status: Partially met  5.  Pt will improve distance by at least 50 meters with LRAD in order to show improved cardiovascular and LE endurance. Baseline: 9/17: 776 feet (with RW), 10/15: 753 feet (with SPC).  11/26: 775 feet Goal status: Partially met  PLAN:  PT FREQUENCY: 1x/week  PT DURATION: 4 weeks  PLANNED INTERVENTIONS: Therapeutic exercises, Therapeutic activity, Neuromuscular re-education, Balance training,  Gait training, Patient/Family education, Joint mobilization, Stair training, Cryotherapy, Moist heat, and Manual therapy  PLAN FOR NEXT SESSION: Discharge visit   Cammie Mcgee, PT, DPT # [phone] 03/07/23, 1:55 PM

## 2023-03-13 ENCOUNTER — Ambulatory Visit: Payer: Medicare Other | Admitting: Orthopedic Surgery

## 2023-03-22 ENCOUNTER — Ambulatory Visit: Payer: Medicare Other | Attending: Orthopedic Surgery

## 2023-03-22 DIAGNOSIS — M6281 Muscle weakness (generalized): Secondary | ICD-10-CM | POA: Insufficient documentation

## 2023-03-22 DIAGNOSIS — M25631 Stiffness of right wrist, not elsewhere classified: Secondary | ICD-10-CM | POA: Insufficient documentation

## 2023-03-22 DIAGNOSIS — R278 Other lack of coordination: Secondary | ICD-10-CM | POA: Diagnosis not present

## 2023-03-22 DIAGNOSIS — M25531 Pain in right wrist: Secondary | ICD-10-CM | POA: Insufficient documentation

## 2023-03-22 NOTE — Therapy (Signed)
OUTPATIENT OCCUPATIONAL THERAPY ORTHO EVALUATION  Patient Name: [name] MRN: [id number] DOB:[birth year], 72 y.o., female Today's Date: 03/26/2023  PCP: Dr. Hillard Danker REFERRING PROVIDER: Dr. Rise Paganini  END OF SESSION:  OT End of Session - 03/26/23 1610     Visit Number 1    Number of Visits 12    Date for OT Re-Evaluation 06/14/23    Progress Note Due on Visit 10    OT Start Time 1020    OT Stop Time 1120    OT Time Calculation (min) 60 min    Activity Tolerance Patient tolerated treatment well    Behavior During Therapy WFL for tasks assessed/performed             Past Medical History:  Diagnosis Date   Arthritis    back, fingers with joint pain and swelling.  chronic back pain   Cataract    Chronic back pain    arthritis    Diverticulosis    Elevated cholesterol    takes Niacin daily and Simvastatin   GERD (gastroesophageal reflux disease) 06/2004   non-specific gastritis on EGD 06/2004   Headache(784.0)    occasionally   History of colon polyps 2004, 2009   2004:adenomatous. 2009 hyperplastic.    History of hiatal hernia    Hypertension    takes Tenoretic and Lisinopril daily   Mucoid cyst of joint 08/2013   right index finger   Neuromuscular disorder (HCC)    numbness in right leg after knee replacement   Osteopenia 01/2014   T score -1.1 FRAX 14%/0.5%. Stable from prior DEXA   PONV (postoperative nausea and vomiting)    Rotator cuff arthropathy    Left   Seasonal allergies    Sleep apnea    wears CPAP nightly   Stroke (HCC) 1998   x 2 - mild left-sided weakness   Urge incontinence    Uterine prolapse    Past Surgical History:  Procedure Laterality Date   BACK SURGERY     x 2   BELPHAROPTOSIS REPAIR Bilateral 12/14/2017   BUBBLE STUDY  02/16/2021   Procedure: BUBBLE STUDY;  Surgeon: Vesta Mixer, MD;  Location: [facility] ENDOSCOPY;  Service: Cardiovascular;;   CARPAL TUNNEL RELEASE Bilateral    Dr August Saucer   CATARACT EXTRACTION      CATARACT EXTRACTION Bilateral 10/2017   COLONOSCOPY  2004, 2009, 2014   FINGER SURGERY     GUM SURGERY     GYNECOLOGIC CRYOSURGERY     HYSTEROSCOPY WITH D & C  12/07/2010   with resection of endometrial polyp   JOINT REPLACEMENT     KNEE ARTHROSCOPY Left 2008   lip biopsy     done at MD office Fri 11/22/13   MASS EXCISION Right 08/15/2013   Procedure: RIGHT INDEX EXCISION MASS ;  Surgeon: Tami Ribas, MD;  Location: [facility];  Service: Orthopedics;  Laterality: Right;   NASAL SEPTUM SURGERY     OOPHORECTOMY Right 2000   REVERSE SHOULDER ARTHROPLASTY Left 12/11/2018   REVERSE SHOULDER ARTHROPLASTY Left 12/11/2018   Procedure: LEFT REVERSE SHOULDER ARTHROPLASTY;  Surgeon: Cammy Copa, MD;  Location: [facility] OR;  Service: Orthopedics;  Laterality: Left;   SHOULDER ARTHROSCOPY WITH OPEN ROTATOR CUFF REPAIR AND DISTAL CLAVICLE ACROMINECTOMY Right 11/26/2013   Procedure: RIGHT SHOULDER ARTHROSCOPY WITH MINI OPEN ROTATOR CUFF REPAIR AND DISTAL CLAVICLE RESECTION, SUBACROMIAL DECOMPRESSION, POSSIBLE [facility] PATCH.;  Surgeon: Valeria Batman, MD;  Location: [facility] OR;  Service: Orthopedics;  Laterality: Right;   TEE WITHOUT CARDIOVERSION N/A 02/16/2021   Procedure: TRANSESOPHAGEAL ECHOCARDIOGRAM (TEE);  Surgeon: Elease Hashimoto Deloris Ping, MD;  Location: [facility] ENDOSCOPY;  Service: Cardiovascular;  Laterality: N/A;   TOTAL KNEE ARTHROPLASTY Right 03/21/2017   TOTAL KNEE ARTHROPLASTY Right 03/21/2017   Procedure: RIGHT TOTAL KNEE ARTHROPLASTY;  Surgeon: Valeria Batman, MD;  Location: [facility] OR;  Service: Orthopedics;  Laterality: Right;   TOTAL KNEE ARTHROPLASTY Left 11/29/2022   Procedure: LEFT TOTAL KNEE ARTHROPLASTY;  Surgeon: Cammy Copa, MD;  Location: [facility] ORS;  Service: Orthopedics;  Laterality: Left;   TUBAL LIGATION     Patient Active Problem List   Diagnosis Date Noted   Unintentional weight loss 03/24/2023   S/P total knee replacement, left 11/29/2022   Urinary  incontinence 03/25/2022   ICH (intracerebral hemorrhage) (HCC) 02/13/2021   Iron deficiency 10/07/2020   Aortic atherosclerosis (HCC) 03/17/2020   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   Chronic left shoulder pain 08/23/2018   Arthritis of left knee 11/02/2016   Chronic diarrhea 12/07/2014   Segmental colitis with rectal bleeding (HCC) 09/12/2014   Routine general medical examination at a health care facility 06/05/2014   Overweight 06/13/2011   Hyperlipidemia 02/17/2007   Essential hypertension 01/08/2007   Osteoarthritis 01/08/2007    ONSET DATE: 11/30/22  REFERRING DIAG: Pain in R wrist; closed intra-articular fracture of distal end of right radius  THERAPY DIAG:  Muscle weakness (generalized)  Other lack of coordination  Pain in right wrist  Stiffness of right wrist joint  Rationale for Evaluation and Treatment: Rehabilitation  SUBJECTIVE:   SUBJECTIVE STATEMENT: Pt reports that while she was in the hospital for her L TKA, she fell in the bathroom and injured her R wrist.  Pt reports that she had 2 rounds of negative Xrays despite ongoing pain, and ultimately fx was confirmed with an MRI. Pt accompanied by: self  PERTINENT HISTORY:  Per MEDICAL RECORD NUMBERNancy B Haley is a 72 y.o. female who presents to the office reporting decreasing pain from right wrist fracture.  She has seen another hand surgeon for evaluation of the right wrist and they have recommended occupational therapy which has been arranged.   After 2 negative xrays, MRI confirmed: Other closed intra-articular fracture of distal end of right radius, initial encounter (Primary Dx);  Additional PMH of: Primary osteoarthritis of right wrist;  Primary osteoarthritis of right hand  Carpal tunnel release B hands ~1.5 years ago Hx of CVA, treated in outpatient OT by this clinician  PRECAUTIONS: Other: ok to wear the wrist brace for comfort as needed  RED FLAGS: None   WEIGHT BEARING RESTRICTIONS:  No  PAIN:  Are you having pain? Yes: NPRS scale: 0 at rest, with activity 5/10 Pain location: dorsal wrist and hand, radial aspect Pain description: tight and a little sharp, a little achy Aggravating factors: weight bearing through the hand and movement Relieving factors: rest, Voltaren gel may help somewhat, OTC pain meds  FALLS: Has patient fallen in last 6 months? Yes. Number of falls 1 fall (cause of injury)  LIVING ENVIRONMENT: Lives with: lives with their spouse Lives in: 3 level home (stair lift to bonus room), pt gets on her gator (ATV) and goes around the house to access the basement Stairs: Yes: External: 2 steps; on right going up Has following equipment at home: Quad cane small base, shower chair, elevated toilet seat, grab bar at the toilet   PLOF: Independent  PATIENT GOALS: "To be able to use  my R hand for my daily activities.  I'm R handed so I want to be as good as I can."   NEXT MD VISIT: Posey Rea Going to have an MRI for low back d/t worsening numbness in the R Leg.    OBJECTIVE:  Note: Objective measures were completed at Evaluation unless otherwise noted.  HAND DOMINANCE: Right  ADLs: Overall ADLs: modified indep with basic self care prior to injury.  Spouse currently assisting with opening bottles.  Pt endorses difficulty washing under L arm pit d/t lack of wrist mobility, spouse assists with lifting, carrying heavier items in kitchen.  Difficulty with tasks that require bending, pulling, or pushing from the wrist.  Difficulty putting in earrings.   FUNCTIONAL OUTCOME MEASURES: FOTO: 43; predicted 60  UPPER EXTREMITY ROM:     Active ROM Right eval Left eval  Shoulder flexion    Shoulder abduction    Shoulder adduction    Shoulder extension    Shoulder internal rotation    Shoulder external rotation    Elbow flexion    Elbow extension    Wrist flexion 29 49  Wrist extension 39 58  Wrist ulnar deviation 29 54  Wrist radial deviation 14 31  Wrist  pronation 84 88  Wrist supination 64 76  (Blank rows = not tested)  Active ROM Right eval Left eval  Thumb MCP (0-60)    Thumb IP (0-80)    Thumb Radial abd/add (0-55)     Thumb Palmar abd/add (0-45)     Thumb Opposition to Small Finger     Index MCP (0-90)     Index PIP (0-100)     Index DIP (0-70)      Long MCP (0-90)      Long PIP (0-100)      Long DIP (0-70)      Ring MCP (0-90)      Ring PIP (0-100)      Ring DIP (0-70)      Little MCP (0-90)      Little PIP (0-100)      Little DIP (0-70)      (Blank rows = not tested)   Full composite fist bilaterally, slightly painful on the R with full closure   Able to oppose all digits to thumb in bilat hands, some pain with R hand digit opposition  UPPER EXTREMITY MMT:     MMT Right eval Left eval  Shoulder flexion    Shoulder abduction    Shoulder adduction    Shoulder extension    Shoulder internal rotation    Shoulder external rotation    Middle trapezius    Lower trapezius    Elbow flexion    Elbow extension    Wrist flexion 4- 4+  Wrist extension 4- 4+  Wrist ulnar deviation 4- 4+  Wrist radial deviation 4- 4+  Wrist pronation 4- 4+  Wrist supination 4- 4+  (Blank rows = not tested)  HAND FUNCTION: Grip strength: Right: 21 lbs; Left: 20 (L side hx of shoulder replacement and affected by CVA) lbs, Lateral pinch: Right: 11 lbs, Left: 8 lbs, and 3 point pinch: Right: 5 lbs, Left: 6 lbs  COORDINATION: 9 Hole Peg test: Right: 41 sec; Left: 43 sec  SENSATION: R hand WFL, L hand thumb and index finger mildly numb (but improved since carpal tunnel release)  EDEMA: very mild swelling in R dorsal wrist   COGNITION: Overall cognitive status: Within functional limits for tasks assessed  OBSERVATIONS:  Pt known to this OT from previous OT to tx LUE weakness post CVA in 2022.  Pt eager to increase function in R hand post wrist fx as she is R hand dominant.  Pt requests 1x per week d/t pt living in [place] which  requires an extended drive to this Clinic.  Pt historically adherent to HEPs so anticipate that 1x per week should be acceptable.  TODAY'S TREATMENT:                                                                                                                              DATE: 03/22/23: Evaluation completed.   Therapeutic Exercise: Issued light blue theraputty and instructed pt in strengthening and coordination exercises for R hand, including gross grasping, lateral/2 point/3 point pinching, digit abd/add, and digging coins out of putty.  Able to return demo with intermittent vc for technique to improve quality of movement.  Encouraged completion 10-15 min, 1-2x per day.  Instructed pt in self passive stretching for R wrist flex, ext, ulnar and radial deviation, and forearm supination with good return demo; min vc for technique.  Issued visual handouts for carryover of above noted exercises.    PATIENT EDUCATION: Education details: OT role, goals, poc, HEP Person educated: Patient Education method: Explanation Education comprehension: verbalized understanding  HOME EXERCISE PROGRAM: Light blue theraputty, self passive wrist and forearm ROM  GOALS: Goals reviewed with patient? Yes  SHORT TERM GOALS: Target date: 05/03/23  Pt will be indep to perform HEP for improving R wrist flexibility and strength for daily tasks. Baseline: Eval: Initiated theraputty and passive wrist flexibility exercises at eval; min vc with handouts Goal status: INITIAL  LONG TERM GOALS: Target date: 07/04/23  Pt will increase FOTO score to 60 or better to indicate improvement in self perceived functional use of the R hand with daily tasks. Baseline: Eval: 43 Goal status: INITIAL  2.  Pt will increase R grip strength by 10 or more lbs to enable indep with opening jars/containers.  Baseline: Eval: R grip 21 lbs (spouse assists to open jars) Goal status: INITIAL  3.  Pt will increase R wrist  flex/ext/supination by 7-10* to enable sufficient mobility to engage R dominant hand during grooming and eating.   Baseline: Eval: R wrist flex 29, ext 39, sup 64 (pt reports inability to put in earrings and difficulty bringing eating utensils to mouth) Goal status: INITIAL  4.  Pt will increase R wrist strength by 1/2 or more muscle grades in order to use R dominant hand for refilling dog food and water dishes.  Baseline: Eval: R wrist strength grossly 4-; pt uses L non-dominant hand to refill dog food and water dishes.  Goal status: INITIAL  ASSESSMENT:  CLINICAL IMPRESSION: Patient is a 72 y.o. female who was seen today for occupational therapy evaluation for functional decline related to R wrist fx.  Pt is R hand dominant and currently having to compensate with L  non-dominant hand to perform most daily tasks, specifically anything that requires pushing, lifting, and grasping.  Pt with hx of CVA resulting in L sided weakness, so this side was weak at baseline.  Prior to fx, pt was indep with ADL and IADL tasks.  Spouse now assisting with opening containers, lifting, carrying, and transporting heavier ADL/IADL supplies.  Pt will benefit from skilled OT to improve R wrist pain, R wrist mobility, and strength in order to increase functional use of the R dominant hand for daily tasks.   PERFORMANCE DEFICITS: in functional skills including ADLs, IADLs, coordination, sensation, edema, ROM, strength, pain, flexibility, Fine motor control, body mechanics, decreased knowledge of use of DME, and UE functional use, and psychosocial skills including coping strategies, environmental adaptation, habits, and routines and behaviors.   IMPAIRMENTS: are limiting patient from ADLs, IADLs, rest and sleep, and leisure.   COMORBIDITIES: has co-morbidities such as L sided hemiparesis, OA bilat hands  that affects occupational performance. Patient will benefit from skilled OT to address above impairments and improve  overall function.  MODIFICATION OR ASSISTANCE TO COMPLETE EVALUATION: No modification of tasks or assist necessary to complete an evaluation.  OT OCCUPATIONAL PROFILE AND HISTORY: Problem focused assessment: Including review of records relating to presenting problem.  CLINICAL DECISION MAKING: Moderate - several treatment options, min-mod task modification necessary  REHAB POTENTIAL: Good  EVALUATION COMPLEXITY: Low      PLAN:  OT FREQUENCY: 1-2x/week  OT DURATION: 12 weeks  PLANNED INTERVENTIONS: 97168 OT Re-evaluation, 97535 self care/ADL training, 56213 therapeutic exercise, 97530 therapeutic activity, 97140 manual therapy, 97035 ultrasound, 97018 paraffin, 08657 moist heat, 97010 cryotherapy, 97034 contrast bath, 97033 iontophoresis, 97760 Orthotics management and training, 84696 Splinting (initial encounter), [id number] Subsequent splinting/medication, passive range of motion, psychosocial skills training, coping strategies training, patient/family education, and DME and/or AE instructions  RECOMMENDED OTHER SERVICES: None at this time  CONSULTED AND AGREED WITH PLAN OF CARE: Patient  PLAN FOR NEXT SESSION: See above   Danelle Earthly, MS, OTR/L  Otis Dials, OT 03/26/2023, 4:13 PM

## 2023-03-23 ENCOUNTER — Other Ambulatory Visit: Payer: Self-pay | Admitting: Internal Medicine

## 2023-03-23 ENCOUNTER — Ambulatory Visit
Admission: RE | Admit: 2023-03-23 | Discharge: 2023-03-23 | Disposition: A | Discharge status: Home / Self Care | Payer: Medicare Other | Source: Ambulatory Visit | Attending: Orthopedic Surgery | Admitting: Orthopedic Surgery

## 2023-03-23 DIAGNOSIS — M48061 Spinal stenosis, lumbar region without neurogenic claudication: Secondary | ICD-10-CM | POA: Diagnosis not present

## 2023-03-23 DIAGNOSIS — G8929 Other chronic pain: Secondary | ICD-10-CM

## 2023-03-23 DIAGNOSIS — M5126 Other intervertebral disc displacement, lumbar region: Secondary | ICD-10-CM | POA: Diagnosis not present

## 2023-03-23 DIAGNOSIS — M47816 Spondylosis without myelopathy or radiculopathy, lumbar region: Secondary | ICD-10-CM | POA: Diagnosis not present

## 2023-03-24 ENCOUNTER — Encounter: Payer: Self-pay | Admitting: Internal Medicine

## 2023-03-24 ENCOUNTER — Ambulatory Visit (INDEPENDENT_AMBULATORY_CARE_PROVIDER_SITE_OTHER): Payer: Medicare Other | Admitting: Internal Medicine

## 2023-03-24 ENCOUNTER — Ambulatory Visit: Payer: Medicare Other

## 2023-03-24 VITALS — BP 122/84 | HR 81 | Temp 98.6°F | Ht 62.0 in | Wt 159.0 lb

## 2023-03-24 DIAGNOSIS — E559 Vitamin D deficiency, unspecified: Secondary | ICD-10-CM | POA: Diagnosis not present

## 2023-03-24 DIAGNOSIS — E782 Mixed hyperlipidemia: Secondary | ICD-10-CM

## 2023-03-24 DIAGNOSIS — R634 Abnormal weight loss: Secondary | ICD-10-CM | POA: Insufficient documentation

## 2023-03-24 DIAGNOSIS — R5383 Other fatigue: Secondary | ICD-10-CM

## 2023-03-24 DIAGNOSIS — I10 Essential (primary) hypertension: Secondary | ICD-10-CM | POA: Diagnosis not present

## 2023-03-24 LAB — CBC
HCT: 35.7 % — ABNORMAL LOW (ref 36.0–46.0)
Hemoglobin: 10.9 g/dL — ABNORMAL LOW (ref 12.0–15.0)
MCHC: 30.6 g/dL (ref 30.0–36.0)
MCV: 73 fL — ABNORMAL LOW (ref 78.0–100.0)
Platelets: 305 10*3/uL (ref 150.0–400.0)
RBC: 4.89 Mil/uL (ref 3.87–5.11)
RDW: 17.5 % — ABNORMAL HIGH (ref 11.5–15.5)
WBC: 8.3 10*3/uL (ref 4.0–10.5)

## 2023-03-24 LAB — TSH: TSH: 1.39 u[IU]/mL (ref 0.35–5.50)

## 2023-03-24 LAB — LIPID PANEL
Cholesterol: 133 mg/dL (ref 0–200)
HDL: 43.3 mg/dL (ref >39.00)
LDL Cholesterol: 64 mg/dL (ref 0–99)
NonHDL: 89.66
Total CHOL/HDL Ratio: 3
Triglycerides: 130 mg/dL (ref 0.0–149.0)
VLDL: 26 mg/dL (ref 0.0–40.0)

## 2023-03-24 LAB — VITAMIN B12: Vitamin B-12: 837 pg/mL (ref 211–911)

## 2023-03-24 LAB — VITAMIN D 25 HYDROXY (VIT D DEFICIENCY, FRACTURES): VITD: 65.41 ng/mL (ref 30.00–100.00)

## 2023-03-24 NOTE — Patient Instructions (Signed)
We will check the labs today. 

## 2023-03-24 NOTE — Progress Notes (Signed)
   Subjective:   Patient ID: [name], female    DOB: March 04, 1951, 72 y.o.   MRN: [id number]  HPI The patient is a 72 YO female coming in for follow up see A/P for details. Is losing some weight lately and struggling with taste.   Review of Systems  Constitutional:  Positive for unexpected weight change.  HENT: Negative.         Taste change  Eyes: Negative.   Respiratory:  Negative for cough, chest tightness and shortness of breath.   Cardiovascular:  Negative for chest pain, palpitations and leg swelling.  Gastrointestinal:  Negative for abdominal distention, abdominal pain, constipation, diarrhea, nausea and vomiting.  Musculoskeletal: Negative.   Skin: Negative.   Neurological: Negative.   Psychiatric/Behavioral: Negative.      Objective:  Physical Exam Constitutional:      Appearance: She is well-developed.  HENT:     Head: Normocephalic and atraumatic.  Cardiovascular:     Rate and Rhythm: Normal rate and regular rhythm.  Pulmonary:     Effort: Pulmonary effort is normal. No respiratory distress.     Breath sounds: Normal breath sounds. No wheezing or rales.  Abdominal:     General: Bowel sounds are normal. There is no distension.     Palpations: Abdomen is soft.     Tenderness: There is no abdominal tenderness. There is no rebound.  Musculoskeletal:        General: Tenderness present.     Cervical back: Normal range of motion.  Skin:    General: Skin is warm and dry.  Neurological:     Mental Status: She is alert and oriented to person, place, and time.     Coordination: Coordination normal.     Vitals:   03/24/23 1105  BP: 122/84  Pulse: 81  Temp: 98.6 F (37 C)  TempSrc: Oral  SpO2: 98%  Weight: 159 lb (72.1 kg)  Height: 5\' 2"  (1.575 m)    Assessment & Plan:  Visit time 25 minutes in face to face communication with patient and coordination of care, additional 5 minutes spent in record review, coordination or care, ordering tests,  communicating/referring to other healthcare professionals, documenting in medical records all on the same day of the visit for total time 30 minutes spent on the visit.

## 2023-03-24 NOTE — Assessment & Plan Note (Signed)
BP at goal on atenolol/chlorthalidone and recent BMP normal will leave the same.

## 2023-03-24 NOTE — Assessment & Plan Note (Signed)
Checking lipid panel and adjust simvastatin 20 mg daily as needed.

## 2023-03-24 NOTE — Assessment & Plan Note (Signed)
Weight down 9 pounds in the last 6 months and not intentional. We will check B12, vitamin D, CBC, CMP, TSH today. Treat as appropriate. She is asked to increase intake she is struggling with that due to change in taste last year or so.

## 2023-03-27 ENCOUNTER — Ambulatory Visit: Payer: Medicare Other

## 2023-03-27 ENCOUNTER — Encounter: Payer: Self-pay | Admitting: Internal Medicine

## 2023-03-27 DIAGNOSIS — R278 Other lack of coordination: Secondary | ICD-10-CM | POA: Diagnosis not present

## 2023-03-27 DIAGNOSIS — M6281 Muscle weakness (generalized): Secondary | ICD-10-CM | POA: Diagnosis not present

## 2023-03-27 DIAGNOSIS — M25631 Stiffness of right wrist, not elsewhere classified: Secondary | ICD-10-CM

## 2023-03-27 DIAGNOSIS — M25531 Pain in right wrist: Secondary | ICD-10-CM

## 2023-03-27 NOTE — Therapy (Signed)
OUTPATIENT OCCUPATIONAL THERAPY ORTHO TREATMENT NOTE  Patient Name: Stacy WUEBBEN MRN: [id number] DOB:10-20-1950, 72 y.o., female Today's Date: 03/27/2023  PCP: Dr. Hillard Danker REFERRING PROVIDER: Dr. Rise Paganini  END OF SESSION:  OT End of Session - 03/27/23 1018     Visit Number 2    Number of Visits 12    Date for OT Re-Evaluation 06/14/23    Progress Note Due on Visit 10    OT Start Time 1015    OT Stop Time 1100    OT Time Calculation (min) 45 min    Activity Tolerance Patient tolerated treatment well    Behavior During Therapy WFL for tasks assessed/performed            Past Medical History:  Diagnosis Date   Arthritis    back, fingers with joint pain and swelling.  chronic back pain   Cataract    Chronic back pain    arthritis    Diverticulosis    Elevated cholesterol    takes Niacin daily and Simvastatin   GERD (gastroesophageal reflux disease) 06/2004   non-specific gastritis on EGD 06/2004   Headache(784.0)    occasionally   History of colon polyps 2004, 2009   2004:adenomatous. 2009 hyperplastic.    History of hiatal hernia    Hypertension    takes Tenoretic and Lisinopril daily   Mucoid cyst of joint 08/2013   right index finger   Neuromuscular disorder (HCC)    numbness in right leg after knee replacement   Osteopenia 01/2014   T score -1.1 FRAX 14%/0.5%. Stable from prior DEXA   PONV (postoperative nausea and vomiting)    Rotator cuff arthropathy    Left   Seasonal allergies    Sleep apnea    wears CPAP nightly   Stroke (HCC) 1998   x 2 - mild left-sided weakness   Urge incontinence    Uterine prolapse    Past Surgical History:  Procedure Laterality Date   BACK SURGERY     x 2   BELPHAROPTOSIS REPAIR Bilateral 12/14/2017   BUBBLE STUDY  02/16/2021   Procedure: BUBBLE STUDY;  Surgeon: Vesta Mixer, MD;  Location: [facility] ENDOSCOPY;  Service: Cardiovascular;;   CARPAL TUNNEL RELEASE Bilateral    Dr August Saucer   CATARACT  EXTRACTION     CATARACT EXTRACTION Bilateral 10/2017   COLONOSCOPY  2004, 2009, 2014   FINGER SURGERY     GUM SURGERY     GYNECOLOGIC CRYOSURGERY     HYSTEROSCOPY WITH D & C  12/07/2010   with resection of endometrial polyp   JOINT REPLACEMENT     KNEE ARTHROSCOPY Left 2008   lip biopsy     done at MD office Fri 11/22/13   MASS EXCISION Right 08/15/2013   Procedure: RIGHT INDEX EXCISION MASS ;  Surgeon: Tami Ribas, MD;  Location: [facility];  Service: Orthopedics;  Laterality: Right;   NASAL SEPTUM SURGERY     OOPHORECTOMY Right 2000   REVERSE SHOULDER ARTHROPLASTY Left 12/11/2018   REVERSE SHOULDER ARTHROPLASTY Left 12/11/2018   Procedure: LEFT REVERSE SHOULDER ARTHROPLASTY;  Surgeon: Cammy Copa, MD;  Location: [facility] OR;  Service: Orthopedics;  Laterality: Left;   SHOULDER ARTHROSCOPY WITH OPEN ROTATOR CUFF REPAIR AND DISTAL CLAVICLE ACROMINECTOMY Right 11/26/2013   Procedure: RIGHT SHOULDER ARTHROSCOPY WITH MINI OPEN ROTATOR CUFF REPAIR AND DISTAL CLAVICLE RESECTION, SUBACROMIAL DECOMPRESSION, POSSIBLE [facility]  Surgeon: Valeria Batman, MD;  Location: [facility] OR;  Service: Orthopedics;  Laterality: Right;   TEE WITHOUT CARDIOVERSION N/A 02/16/2021   Procedure: TRANSESOPHAGEAL ECHOCARDIOGRAM (TEE);  Surgeon: Elease Hashimoto Deloris Ping, MD;  Location: [facility] ENDOSCOPY;  Service: Cardiovascular;  Laterality: N/A;   TOTAL KNEE ARTHROPLASTY Right 03/21/2017   TOTAL KNEE ARTHROPLASTY Right 03/21/2017   Procedure: RIGHT TOTAL KNEE ARTHROPLASTY;  Surgeon: Valeria Batman, MD;  Location: [facility] OR;  Service: Orthopedics;  Laterality: Right;   TOTAL KNEE ARTHROPLASTY Left 11/29/2022   Procedure: LEFT TOTAL KNEE ARTHROPLASTY;  Surgeon: Cammy Copa, MD;  Location: [facility] ORS;  Service: Orthopedics;  Laterality: Left;   TUBAL LIGATION     Patient Active Problem List   Diagnosis Date Noted   Unintentional weight loss 03/24/2023   S/P total knee replacement, left 11/29/2022    Urinary incontinence 03/25/2022   ICH (intracerebral hemorrhage) (HCC) 02/13/2021   Iron deficiency 10/07/2020   Aortic atherosclerosis (HCC) 03/17/2020   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   Chronic left shoulder pain 08/23/2018   Arthritis of left knee 11/02/2016   Chronic diarrhea 12/07/2014   Segmental colitis with rectal bleeding (HCC) 09/12/2014   Routine general medical examination at a health care facility 06/05/2014   Overweight 06/13/2011   Hyperlipidemia 02/17/2007   Essential hypertension 01/08/2007   Osteoarthritis 01/08/2007   ONSET DATE: 11/30/22  REFERRING DIAG: Pain in R wrist; closed intra-articular fracture of distal end of right radius  THERAPY DIAG:  Muscle weakness (generalized)  Other lack of coordination  Pain in right wrist  Stiffness of right wrist joint  Rationale for Evaluation and Treatment: Rehabilitation  SUBJECTIVE:   SUBJECTIVE STATEMENT: Pt reports she had an upset stomach yesterday, but feeling better today.  Pt stated that she started doing some of her stretches for her wrist over the weekend. Pt accompanied by: self  PERTINENT HISTORY:  Per MEDICAL RECORD NUMBERNancy B Haley is a 72 y.o. female who presents to the office reporting decreasing pain from right wrist fracture.  She has seen another hand surgeon for evaluation of the right wrist and they have recommended occupational therapy which has been arranged.   After 2 negative xrays, MRI confirmed: Other closed intra-articular fracture of distal end of right radius, initial encounter (Primary Dx);  Additional PMH of: Primary osteoarthritis of right wrist;  Primary osteoarthritis of right hand  Carpal tunnel release B hands ~1.5 years ago Hx of CVA, treated in outpatient OT by this clinician  PRECAUTIONS: Other: ok to wear the wrist brace for comfort as needed  RED FLAGS: None   WEIGHT BEARING RESTRICTIONS: No  PAIN:  Are you having pain? Yes: NPRS scale: 0 at rest,  with activity 5/10 Pain location: dorsal wrist and hand, radial aspect Pain description: tight and a little sharp, a little achy Aggravating factors: weight bearing through the hand and movement Relieving factors: rest, Voltaren gel may help somewhat, OTC pain meds  FALLS: Has patient fallen in last 6 months? Yes. Number of falls 1 fall (cause of injury)  LIVING ENVIRONMENT: Lives with: lives with their spouse Lives in: 3 level home (stair lift to bonus room), pt gets on her gator (ATV) and goes around the house to access the basement Stairs: Yes: External: 2 steps; on right going up Has following equipment at home: Quad cane small base, shower chair, elevated toilet seat, grab bar at the toilet   PLOF: Independent  PATIENT GOALS: "To be able to use my R hand for my daily activities.  I'm R handed so I want to be as  good as I can."   NEXT MD VISIT: Posey Rea Going to have an MRI for low back d/t worsening numbness in the R Leg.    OBJECTIVE:  Note: Objective measures were completed at Evaluation unless otherwise noted.  HAND DOMINANCE: Right  ADLs: Overall ADLs: modified indep with basic self care prior to injury.  Spouse currently assisting with opening bottles.  Pt endorses difficulty washing under L arm pit d/t lack of wrist mobility, spouse assists with lifting, carrying heavier items in kitchen.  Difficulty with tasks that require bending, pulling, or pushing from the wrist.  Difficulty putting in earrings.   FUNCTIONAL OUTCOME MEASURES: FOTO: 43; predicted 60  UPPER EXTREMITY ROM:     Active ROM Right eval Left eval  Shoulder flexion    Shoulder abduction    Shoulder adduction    Shoulder extension    Shoulder internal rotation    Shoulder external rotation    Elbow flexion    Elbow extension    Wrist flexion 29 49  Wrist extension 39 58  Wrist ulnar deviation 29 54  Wrist radial deviation 14 31  Wrist pronation 84 88  Wrist supination 64 76  (Blank rows = not  tested)  Active ROM Right eval Left eval  Thumb MCP (0-60)    Thumb IP (0-80)    Thumb Radial abd/add (0-55)     Thumb Palmar abd/add (0-45)     Thumb Opposition to Small Finger     Index MCP (0-90)     Index PIP (0-100)     Index DIP (0-70)      Long MCP (0-90)      Long PIP (0-100)      Long DIP (0-70)      Ring MCP (0-90)      Ring PIP (0-100)      Ring DIP (0-70)      Little MCP (0-90)      Little PIP (0-100)      Little DIP (0-70)      (Blank rows = not tested)   Full composite fist bilaterally, slightly painful on the R with full closure   Able to oppose all digits to thumb in bilat hands, some pain with R hand digit opposition  UPPER EXTREMITY MMT:     MMT Right eval Left eval  Shoulder flexion    Shoulder abduction    Shoulder adduction    Shoulder extension    Shoulder internal rotation    Shoulder external rotation    Middle trapezius    Lower trapezius    Elbow flexion    Elbow extension    Wrist flexion 4- 4+  Wrist extension 4- 4+  Wrist ulnar deviation 4- 4+  Wrist radial deviation 4- 4+  Wrist pronation 4- 4+  Wrist supination 4- 4+  (Blank rows = not tested)  HAND FUNCTION: Grip strength: Right: 21 lbs; Left: 20 (L side hx of shoulder replacement and affected by CVA) lbs, Lateral pinch: Right: 11 lbs, Left: 8 lbs, and 3 point pinch: Right: 5 lbs, Left: 6 lbs  COORDINATION: 9 Hole Peg test: Right: 41 sec; Left: 43 sec  SENSATION: R hand WFL, L hand thumb and index finger mildly numb (but improved since carpal tunnel release)  EDEMA: very mild swelling in R dorsal wrist   COGNITION: Overall cognitive status: Within functional limits for tasks assessed  OBSERVATIONS:  Pt known to this OT from previous OT to tx LUE weakness post CVA in 2022.  Pt eager to increase function in R hand post wrist fx as she is R hand dominant.  Pt requests 1x per week d/t pt living in [place] which requires an extended drive to this Clinic.  Pt historically  adherent to HEPs so anticipate that 1x per week should be acceptable.  TODAY'S TREATMENT:                                                                                                                              DATE: 03/27/23: Paraffin:  X10 min for R wrist/hand pain management/muscle relaxation in prep for therapeutic exercises.  Manual Therapy: Performed grade 1 and 2 joint mobs to R wrist and forearm all planes, working to decrease pain and increase mobility to wrist and forearm joint ranges.   Therapeutic Exercise: Facilitated R grip strengthening with hand gripper set at min resistance with 1 red band to complete 3 sets 10 reps.  Rest and gentle passive wrist ext stretch between sets.    Facilitated pinch strengthening with use of therapy resistant clothespins to target lateral and 3 point pinch of R hand.  Worked with light resistance clothespins only (yellow and red) to complete 2 sets of each pinch type, clipping pins on/off of a vertical dowel.    Performed passive wrist and forearm stretches, using heat around wrist and forearm throughout, focusing on wrist flex and ext, forearm supination, and radial/ulnar deviation.    Reviewed AROM and self passive stretching at the wrist and forearm with encouragement to complete throughout the day each day, using heat to relax muscles for maximizing end range and improving tolerance.  Advised on good times for stretching to be during a hot shower or during or after washing dishes to capitalize on benefits on hot water to relax muscles.    PATIENT EDUCATION: Education details: HEP Person educated: Patient Education method: Explanation Education comprehension: verbalized understanding  HOME EXERCISE PROGRAM: Light blue theraputty, self passive wrist and forearm ROM, AROM  GOALS: Goals reviewed with patient? Yes  SHORT TERM GOALS: Target date: 05/03/23  Pt will be indep to perform HEP for improving R wrist flexibility and strength for  daily tasks. Baseline: Eval: Initiated theraputty and passive wrist flexibility exercises at eval; min vc with handouts Goal status: INITIAL  LONG TERM GOALS: Target date: 07/04/23  Pt will increase FOTO score to 60 or better to indicate improvement in self perceived functional use of the R hand with daily tasks. Baseline: Eval: 43 Goal status: INITIAL  2.  Pt will increase R grip strength by 10 or more lbs to enable indep with opening jars/containers.  Baseline: Eval: R grip 21 lbs (spouse assists to open jars) Goal status: INITIAL  3.  Pt will increase R wrist flex/ext/supination by 7-10* to enable sufficient mobility to engage R dominant hand during grooming and eating.   Baseline: Eval: R wrist flex 29, ext 39, sup 64 (pt reports inability to put in earrings and  difficulty bringing eating utensils to mouth) Goal status: INITIAL  4.  Pt will increase R wrist strength by 1/2 or more muscle grades in order to use R dominant hand for refilling dog food and water dishes.  Baseline: Eval: R wrist strength grossly 4-; pt uses L non-dominant hand to refill dog food and water dishes.  Goal status: INITIAL  ASSESSMENT: CLINICAL IMPRESSION: Pt with good tolerance to paraffin, therapeutic exercises, and manual therapy this date.  Able to increase wrist flex and ext by end of session following joint mobs and passive stretching, increasing flexion by 6 degrees to end range 35, and increasing ext by 16 degrees to end range 55.  Pt acknowledged good benefits from use of paraffin to start session and moist hot pack used intermittently throughout session to increase muscle relaxation and manage pain during exercises and activities noted above.  Pt will continue to benefit from skilled OT to improve R wrist pain, R wrist mobility, and strength in order to increase functional use of the R dominant hand for daily tasks.   PERFORMANCE DEFICITS: in functional skills including ADLs, IADLs, coordination,  sensation, edema, ROM, strength, pain, flexibility, Fine motor control, body mechanics, decreased knowledge of use of DME, and UE functional use, and psychosocial skills including coping strategies, environmental adaptation, habits, and routines and behaviors.   IMPAIRMENTS: are limiting patient from ADLs, IADLs, rest and sleep, and leisure.   COMORBIDITIES: has co-morbidities such as L sided hemiparesis, OA bilat hands  that affects occupational performance. Patient will benefit from skilled OT to address above impairments and improve overall function.  MODIFICATION OR ASSISTANCE TO COMPLETE EVALUATION: No modification of tasks or assist necessary to complete an evaluation.  OT OCCUPATIONAL PROFILE AND HISTORY: Problem focused assessment: Including review of records relating to presenting problem.  CLINICAL DECISION MAKING: Moderate - several treatment options, min-mod task modification necessary  REHAB POTENTIAL: Good  EVALUATION COMPLEXITY: Low      PLAN:  OT FREQUENCY: 1-2x/week  OT DURATION: 12 weeks  PLANNED INTERVENTIONS: 97168 OT Re-evaluation, 97535 self care/ADL training, 09811 therapeutic exercise, 97530 therapeutic activity, 97140 manual therapy, 97035 ultrasound, 97018 paraffin, 91478 moist heat, 97010 cryotherapy, 97034 contrast bath, 97033 iontophoresis, 97760 Orthotics management and training, 29562 Splinting (initial encounter), [id number] Subsequent splinting/medication, passive range of motion, psychosocial skills training, coping strategies training, patient/family education, and DME and/or AE instructions  RECOMMENDED OTHER SERVICES: None at this time  CONSULTED AND AGREED WITH PLAN OF CARE: Patient  PLAN FOR NEXT SESSION: See above   Danelle Earthly, MS, OTR/L  [employer] Dials, OT 03/27/2023, 11:54 AM

## 2023-03-31 ENCOUNTER — Ambulatory Visit: Payer: Medicare Other | Admitting: Orthopedic Surgery

## 2023-03-31 ENCOUNTER — Encounter: Payer: Self-pay | Admitting: Orthopedic Surgery

## 2023-03-31 DIAGNOSIS — M545 Low back pain, unspecified: Secondary | ICD-10-CM

## 2023-03-31 DIAGNOSIS — G8929 Other chronic pain: Secondary | ICD-10-CM

## 2023-03-31 NOTE — Progress Notes (Signed)
Office Visit Note   Patient: [name]           Date of Birth: [birth year]           MRN: [id number] Visit Date: 03/31/2023 Requested by: Myrlene Broker, MD [address] [place],  [place] [address] PCP: Myrlene Broker, MD  Subjective: Chief Complaint  Patient presents with   Other     Review MRI    HPI: [name] is a 72 y.o. female who presents to the office reporting low back pain and right leg and foot numbness.  Since she was last seen she has had an MRI scan.  That report is not available however to my reading she has some postsurgical changes and it looks like there is some right sided foraminal narrowing in the mid lumbar spine region.  Certainly this enough to be causing some of her symptoms..                ROS: All systems reviewed are negative as they relate to the chief complaint within the history of present illness.  Patient denies fevers or chills.  Assessment & Plan: Visit Diagnoses:  1. 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  Her motor strength is good in the leg.  Neuropathy is also a consideration but she is having no left-sided symptoms.  Plan is refer to Dr. Alvester Morin for lumbar spine ESI and follow-up with [place] as needed.  She is going to therapy for her hand at this time as well.  Follow-Up Instructions: No follow-ups on file.   Orders:  Orders Placed This Encounter  Procedures   Ambulatory referral to Physical Medicine Rehab   No orders of the defined types were placed in this encounter.     Procedures: No procedures performed   Clinical Data: No additional findings.  Objective: Vital Signs: LMP 02/09/2005   Physical Exam:  Constitutional: Patient appears well-developed HEENT:  Head: Normocephalic Eyes:EOM are normal Neck: Normal range of motion Cardiovascular: Normal rate Pulmonary/chest: Effort  normal Neurologic: Patient is alert Skin: Skin is warm Psychiatric: Patient has normal mood and affect  Ortho Exam: Ortho exam demonstrates no nerve root tension signs.  5 out of 5 ankle dorsiflexion plantarflexion quad hamstring strength.  Does report decrease sensation on the dorsal and plantar aspect of the right foot.  Pedal pulses palpable.  No groin pain with internal/external Tatian of the leg.  Specialty Comments:  No specialty comments available.  Imaging: No results found.   PMFS History: Patient Active Problem List   Diagnosis Date Noted   Unintentional weight loss 03/24/2023   S/P total knee replacement, left 11/29/2022   Urinary incontinence 03/25/2022   ICH (intracerebral hemorrhage) (HCC) 02/13/2021   Iron deficiency 10/07/2020   Aortic atherosclerosis (HCC) 03/17/2020   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   Chronic left shoulder pain 08/23/2018   Arthritis of left knee 11/02/2016   Chronic diarrhea 12/07/2014   Segmental colitis with rectal bleeding (HCC) 09/12/2014   Routine general medical examination at a health care facility 06/05/2014   Overweight 06/13/2011   Hyperlipidemia 02/17/2007   Essential hypertension 01/08/2007   Osteoarthritis 01/08/2007   Past Medical History:  Diagnosis Date   Arthritis    back, fingers with joint pain and swelling.  chronic back pain   Cataract    Chronic back pain  arthritis    Diverticulosis    Elevated cholesterol    takes Niacin daily and Simvastatin   GERD (gastroesophageal reflux disease) 06/2004   non-specific gastritis on EGD 06/2004   Headache(784.0)    occasionally   History of colon polyps 2004, 2009   2004:adenomatous. 2009 hyperplastic.    History of hiatal hernia    Hypertension    takes Tenoretic and Lisinopril daily   Mucoid cyst of joint 08/2013   right index finger   Neuromuscular disorder (HCC)    numbness in right leg after knee replacement   Osteopenia 01/2014   T score -1.1 FRAX  14%/0.5%. Stable from prior DEXA   PONV (postoperative nausea and vomiting)    Rotator cuff arthropathy    Left   Seasonal allergies    Sleep apnea    wears CPAP nightly   Stroke (HCC) 1998   x 2 - mild left-sided weakness   Urge incontinence    Uterine prolapse     Family History  Problem Relation Age of Onset   Hypertension Mother    Heart disease Mother    Diabetes Father    Hypertension Father    Heart disease Father    Stroke Father    Diabetes Maternal Aunt    Diabetes Paternal Grandmother    Hypertension Paternal Grandfather    Stroke Paternal Grandfather    Colon cancer Neg Hx    Esophageal cancer Neg Hx    Rectal cancer Neg Hx    Stomach cancer Neg Hx    Sleep apnea Neg Hx     Past Surgical History:  Procedure Laterality Date   BACK SURGERY     x 2   BELPHAROPTOSIS REPAIR Bilateral 12/14/2017   BUBBLE STUDY  02/16/2021   Procedure: BUBBLE STUDY;  Surgeon: Vesta Mixer, MD;  Location: [facility] ENDOSCOPY;  Service: Cardiovascular;;   CARPAL TUNNEL RELEASE Bilateral    Dr August Saucer   CATARACT EXTRACTION     CATARACT EXTRACTION Bilateral 10/2017   COLONOSCOPY  2004, 2009, 2014   FINGER SURGERY     GUM SURGERY     GYNECOLOGIC CRYOSURGERY     HYSTEROSCOPY WITH D & C  12/07/2010   with resection of endometrial polyp   JOINT REPLACEMENT     KNEE ARTHROSCOPY Left 2008   lip biopsy     done at MD office Fri 11/22/13   MASS EXCISION Right 08/15/2013   Procedure: RIGHT INDEX EXCISION MASS ;  Surgeon: Tami Ribas, MD;  Location: [facility];  Service: Orthopedics;  Laterality: Right;   NASAL SEPTUM SURGERY     OOPHORECTOMY Right 2000   REVERSE SHOULDER ARTHROPLASTY Left 12/11/2018   REVERSE SHOULDER ARTHROPLASTY Left 12/11/2018   Procedure: LEFT REVERSE SHOULDER ARTHROPLASTY;  Surgeon: Cammy Copa, MD;  Location[facility] OR;  Service: Orthopedics;  Laterality: Left;   SHOULDER ARTHROSCOPY WITH OPEN ROTATOR CUFF REPAIR AND DISTAL CLAVICLE ACROMINECTOMY  Right 11/26/2013   Procedure: RIGHT SHOULDER ARTHROSCOPY WITH MINI OPEN ROTATOR CUFF REPAIR AND DISTAL CLAVICLE RESECTION, SUBACROMIAL DECOMPRESSION, POSSIBLE [facility] PATCH.;  Surgeon: Valeria Batman, MD;  Location: [facility] OR;  Service: Orthopedics;  Laterality: Right;   TEE WITHOUT CARDIOVERSION N/A 02/16/2021   Procedure: TRANSESOPHAGEAL ECHOCARDIOGRAM (TEE);  Surgeon: Elease Hashimoto Deloris Ping, MD;  Location: [facility]OSCOPY;  Service: Cardiovascular;  Laterality: N/A;   TOTAL KNEE ARTHROPLASTY Right 03/21/2017   TOTAL KNEE ARTHROPLASTY Right 03/21/2017   Procedure: RIGHT TOTAL KNEE ARTHROPLASTY;  Surgeon: Valeria Batman,  MD;  Location: [facility] OR;  Service: Orthopedics;  Laterality: Right;   TOTAL KNEE ARTHROPLASTY Left 11/29/2022   Procedure: LEFT TOTAL KNEE ARTHROPLASTY;  Surgeon: Cammy Copa, MD;  Location: [facility];  Service: Orthopedics;  Laterality: Left;   TUBAL LIGATION     Social History   Occupational History   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

## 2023-04-03 ENCOUNTER — Ambulatory Visit: Payer: Medicare Other

## 2023-04-03 DIAGNOSIS — M25531 Pain in right wrist: Secondary | ICD-10-CM

## 2023-04-03 DIAGNOSIS — M6281 Muscle weakness (generalized): Secondary | ICD-10-CM

## 2023-04-03 DIAGNOSIS — R278 Other lack of coordination: Secondary | ICD-10-CM | POA: Diagnosis not present

## 2023-04-03 DIAGNOSIS — M25631 Stiffness of right wrist, not elsewhere classified: Secondary | ICD-10-CM

## 2023-04-03 NOTE — Therapy (Signed)
OUTPATIENT OCCUPATIONAL THERAPY ORTHO TREATMENT NOTE  Patient Name: [name] MRN: [id number] DOB:08/14/1950, 72 y.o., female Today's Date: 04/03/2023  PCP: Dr. Hillard Haley REFERRING PROVIDER: Dr. Rise Haley  END OF SESSION:  OT End of Session - 04/03/23 1452     Visit Number 3    Number of Visits 12    Date for OT Re-Evaluation 06/14/23    Progress Note Due on Visit 10    OT Start Time 1445    OT Stop Time 1530    OT Time Calculation (min) 45 min    Activity Tolerance Patient tolerated treatment well    Behavior During Therapy WFL for tasks assessed/performed            Past Medical History:  Diagnosis Date   Arthritis    back, fingers with joint pain and swelling.  chronic back pain   Cataract    Chronic back pain    arthritis    Diverticulosis    Elevated cholesterol    takes Niacin daily and Simvastatin   GERD (gastroesophageal reflux disease) 06/2004   non-specific gastritis on EGD 06/2004   Headache(784.0)    occasionally   History of colon polyps 2004, 2009   2004:adenomatous. 2009 hyperplastic.    History of hiatal hernia    Hypertension    takes Tenoretic and Lisinopril daily   Mucoid cyst of joint 08/2013   right index finger   Neuromuscular disorder (HCC)    numbness in right leg after knee replacement   Osteopenia 01/2014   T score -1.1 FRAX 14%/0.5%. Stable from prior DEXA   PONV (postoperative nausea and vomiting)    Rotator cuff arthropathy    Left   Seasonal allergies    Sleep apnea    wears CPAP nightly   Stroke (HCC) 1998   x 2 - mild left-sided weakness   Urge incontinence    Uterine prolapse    Past Surgical History:  Procedure Laterality Date   BACK SURGERY     x 2   BELPHAROPTOSIS REPAIR Bilateral 12/14/2017   BUBBLE STUDY  02/16/2021   Procedure: BUBBLE STUDY;  Surgeon: Stacy Mixer, MD;  Location[facility] ENDOSCOPY;  Service: Cardiovascular;;   CARPAL TUNNEL RELEASE Bilateral    Dr [name]   CATARACT  EXTRACTION     CATARACT EXTRACTION Bilateral 10/2017   COLONOSCOPY  2004, 2009, 2014   FINGER SURGERY     GUM SURGERY     GYNECOLOGIC CRYOSURGERY     HYSTEROSCOPY WITH D & C  12/07/2010   with resection of endometrial polyp   JOINT REPLACEMENT     KNEE ARTHROSCOPY Left 2008   lip biopsy     done at MD office Fri 11/22/13   MASS EXCISION Right 08/15/2013   Procedure: RIGHT INDEX EXCISION MASS ;  Surgeon: Stacy Ribas, MD;  Location: [facility];  Service: Orthopedics;  Laterality: Right;   NASAL SEPTUM SURGERY     OOPHORECTOMY Right 2000   REVERSE SHOULDER ARTHROPLASTY Left 12/11/2018   REVERSE SHOULDER ARTHROPLASTY Left 12/11/2018   Procedure: LEFT REVERSE SHOULDER ARTHROPLASTY;  Surgeon: Stacy Copa, MD;  Location: [facility] OR;  Service: Orthopedics;  Laterality: Left;   SHOULDER ARTHROSCOPY WITH OPEN ROTATOR CUFF REPAIR AND DISTAL CLAVICLE ACROMINECTOMY Right 11/26/2013   Procedure: RIGHT SHOULDER ARTHROSCOPY WITH MINI OPEN ROTATOR CUFF REPAIR AND DISTAL CLAVICLE RESECTION, SUBACROMIAL DECOMPRESSION, POSSIBLE [facility] PATCH.;  Surgeon: Stacy Batman, MD;  Location: [facility] OR;  Service: Orthopedics;  Laterality: Right;   TEE WITHOUT CARDIOVERSION N/A 02/16/2021   Procedure: TRANSESOPHAGEAL ECHOCARDIOGRAM (TEE);  Surgeon: Stacy Hashimoto Deloris Ping, MD;  Location: [facility] ENDOSCOPY;  Service: Cardiovascular;  Laterality: N/A;   TOTAL KNEE ARTHROPLASTY Right 03/21/2017   TOTAL KNEE ARTHROPLASTY Right 03/21/2017   Procedure: RIGHT TOTAL KNEE ARTHROPLASTY;  Surgeon: Stacy Batman, MD;  Location: [facility] OR;  Service: Orthopedics;  Laterality: Right;   TOTAL KNEE ARTHROPLASTY Left 11/29/2022   Procedure: LEFT TOTAL KNEE ARTHROPLASTY;  Surgeon: Stacy Copa, MD;  Location: [facility] ORS;  Service: Orthopedics;  Laterality: Left;   TUBAL LIGATION     Patient Active Problem List   Diagnosis Date Noted   Unintentional weight loss 03/24/2023   S/P total knee replacement, left 11/29/2022    Urinary incontinence 03/25/2022   ICH (intracerebral hemorrhage) (HCC) 02/13/2021   Iron deficiency 10/07/2020   Aortic atherosclerosis (HCC) 03/17/2020   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   Chronic left shoulder pain 08/23/2018   Arthritis of left knee 11/02/2016   Chronic diarrhea 12/07/2014   Segmental colitis with rectal bleeding (HCC) 09/12/2014   Routine general medical examination at a health care facility 06/05/2014   Overweight 06/13/2011   Hyperlipidemia 02/17/2007   Essential hypertension 01/08/2007   Osteoarthritis 01/08/2007   ONSET DATE: 11/30/22  REFERRING DIAG: Pain in R wrist; closed intra-articular fracture of distal end of right radius  THERAPY DIAG:  Muscle weakness (generalized)  Pain in right wrist  Stiffness of right wrist joint  Rationale for Evaluation and Treatment: Rehabilitation  SUBJECTIVE:   SUBJECTIVE STATEMENT: Pt reports having more pain today, possibly due to the cold weather. Pt accompanied by: self  PERTINENT HISTORY:  Per MEDICAL RECORD NUMBERNancy B Haley is a 72 y.o. female who presents to the office reporting decreasing pain from right wrist fracture.  She has seen another hand surgeon for evaluation of the right wrist and they have recommended occupational therapy which has been arranged.   After 2 negative xrays, MRI confirmed: Other closed intra-articular fracture of distal end of right radius, initial encounter (Primary Dx);  Additional PMH of: Primary osteoarthritis of right wrist;  Primary osteoarthritis of right hand  Carpal tunnel release B hands ~1.5 years ago Hx of CVA, treated in outpatient OT by this clinician  PRECAUTIONS: Other: ok to wear the wrist brace for comfort as needed  RED FLAGS: None   WEIGHT BEARING RESTRICTIONS: No  PAIN:  Are you having pain? Yes: NPRS scale: 3 at rest, with activity 6/10 Pain location: dorsal wrist and hand, radial aspect Pain description: tight and a little sharp, a  little achy Aggravating factors: weight bearing through the hand and movement Relieving factors: rest, Voltaren gel may help somewhat, OTC pain meds  FALLS: Has patient fallen in last 6 months? Yes. Number of falls 1 fall (cause of injury)  LIVING ENVIRONMENT: Lives with: lives with their spouse Lives in: 3 level home (stair lift to bonus room), pt gets on her gator (ATV) and goes around the house to access the basement Stairs: Yes: External: 2 steps; on right going up Has following equipment at home: Quad cane small base, shower chair, elevated toilet seat, grab bar at the toilet   PLOF: Independent  PATIENT GOALS: "To be able to use my R hand for my daily activities.  I'm R handed so I want to be as good as I can."   NEXT MD VISIT: Posey Rea Going to have an MRI for low back d/t worsening numbness in  the R Leg.    OBJECTIVE:  Note: Objective measures were completed at Evaluation unless otherwise noted.  HAND DOMINANCE: Right  ADLs: Overall ADLs: modified indep with basic self care prior to injury.  Spouse currently assisting with opening bottles.  Pt endorses difficulty washing under L arm pit d/t lack of wrist mobility, spouse assists with lifting, carrying heavier items in kitchen.  Difficulty with tasks that require bending, pulling, or pushing from the wrist.  Difficulty putting in earrings.   FUNCTIONAL OUTCOME MEASURES: FOTO: 43; predicted 60  UPPER EXTREMITY ROM:     Active ROM Right eval Left eval  Shoulder flexion    Shoulder abduction    Shoulder adduction    Shoulder extension    Shoulder internal rotation    Shoulder external rotation    Elbow flexion    Elbow extension    Wrist flexion 29 49  Wrist extension 39 58  Wrist ulnar deviation 29 54  Wrist radial deviation 14 31  Wrist pronation 84 88  Wrist supination 64 76  (Blank rows = not tested)  Active ROM Right eval Left eval  Thumb MCP (0-60)    Thumb IP (0-80)    Thumb Radial abd/add (0-55)      Thumb Palmar abd/add (0-45)     Thumb Opposition to Small Finger     Index MCP (0-90)     Index PIP (0-100)     Index DIP (0-70)      Long MCP (0-90)      Long PIP (0-100)      Long DIP (0-70)      Ring MCP (0-90)      Ring PIP (0-100)      Ring DIP (0-70)      Little MCP (0-90)      Little PIP (0-100)      Little DIP (0-70)      (Blank rows = not tested)   Full composite fist bilaterally, slightly painful on the R with full closure   Able to oppose all digits to thumb in bilat hands, some pain with R hand digit opposition  UPPER EXTREMITY MMT:     MMT Right eval Left eval  Shoulder flexion    Shoulder abduction    Shoulder adduction    Shoulder extension    Shoulder internal rotation    Shoulder external rotation    Middle trapezius    Lower trapezius    Elbow flexion    Elbow extension    Wrist flexion 4- 4+  Wrist extension 4- 4+  Wrist ulnar deviation 4- 4+  Wrist radial deviation 4- 4+  Wrist pronation 4- 4+  Wrist supination 4- 4+  (Blank rows = not tested)  HAND FUNCTION: Grip strength: Right: 21 lbs; Left: 20 (L side hx of shoulder replacement and affected by CVA) lbs, Lateral pinch: Right: 11 lbs, Left: 8 lbs, and 3 point pinch: Right: 5 lbs, Left: 6 lbs  COORDINATION: 9 Hole Peg test: Right: 41 sec; Left: 43 sec  SENSATION: R hand WFL, L hand thumb and index finger mildly numb (but improved since carpal tunnel release)  EDEMA: very mild swelling in R dorsal wrist   COGNITION: Overall cognitive status: Within functional limits for tasks assessed  OBSERVATIONS:  Pt known to this OT from previous OT to tx LUE weakness post CVA in 2022.  Pt eager to increase function in R hand post wrist fx as she is R hand dominant.  Pt requests 1x per  week d/t pt living in [place] which requires an extended drive to this Clinic.  Pt historically adherent to HEPs so anticipate that 1x per week should be acceptable.  TODAY'S TREATMENT:                                                                                                                               DATE: 04/03/23: Paraffin:  X10 min for R wrist/hand pain management/muscle relaxation in prep for therapeutic exercises.  Manual Therapy: -Performed soft tissue massage to R thumb, all sides of wrist and forearm with focus to radial side, working to decrease pain and increase tissue extensibility for improving joint range in the wrist and hand. -Performed grade 1 and 2 joint mobs to R wrist and forearm all planes, working to decrease pain and increase mobility to wrist and forearm joint ranges.   Therapeutic Exercise: Performed passive wrist and forearm stretches, using heat around wrist and forearm throughout, focusing on wrist flex and ext, forearm supination, and radial/ulnar deviation.    PATIENT EDUCATION: Education details: HEP Person educated: Patient Education method: Explanation Education comprehension: verbalized understanding  HOME EXERCISE PROGRAM: Light blue theraputty, self passive wrist and forearm ROM, AROM  GOALS: Goals reviewed with patient? Yes  SHORT TERM GOALS: Target date: 05/03/23  Pt will be indep to perform HEP for improving R wrist flexibility and strength for daily tasks. Baseline: Eval: Initiated theraputty and passive wrist flexibility exercises at eval; min vc with handouts Goal status: INITIAL  LONG TERM GOALS: Target date: 07/04/23  Pt will increase FOTO score to 60 or better to indicate improvement in self perceived functional use of the R hand with daily tasks. Baseline: Eval: 43 Goal status: INITIAL  2.  Pt will increase R grip strength by 10 or more lbs to enable indep with opening jars/containers.  Baseline: Eval: R grip 21 lbs (spouse assists to open jars) Goal status: INITIAL  3.  Pt will increase R wrist flex/ext/supination by 7-10* to enable sufficient mobility to engage R dominant hand during grooming and eating.   Baseline: Eval: R wrist flex  29, ext 39, sup 64 (pt reports inability to put in earrings and difficulty bringing eating utensils to mouth) Goal status: INITIAL  4.  Pt will increase R wrist strength by 1/2 or more muscle grades in order to use R dominant hand for refilling dog food and water dishes.  Baseline: Eval: R wrist strength grossly 4-; pt uses L non-dominant hand to refill dog food and water dishes.  Goal status: INITIAL  ASSESSMENT: CLINICAL IMPRESSION: Pt with increased pain today at start of session, with cold weather possibly being a contributor.  Pt with good tolerance to paraffin, manual therapy and gentle passive stretching this date. Pt continues to demo improved joint range for wrist flexion and extension.  Able to increase active wrist flex to 55 and ext to 59 today after use of heat, massage, and passive stretching this  date.  Pt continues to acknowledge good benefits from use of heat and was encouraged to use heat and perform putty exercises and stretches to tolerance at home over the next week.  Pt will continue to benefit from skilled OT to improve R wrist pain, R wrist mobility, and strength in order to increase functional use of the R dominant hand for daily tasks.   PERFORMANCE DEFICITS: in functional skills including ADLs, IADLs, coordination, sensation, edema, ROM, strength, pain, flexibility, Fine motor control, body mechanics, decreased knowledge of use of DME, and UE functional use, and psychosocial skills including coping strategies, environmental adaptation, habits, and routines and behaviors.   IMPAIRMENTS: are limiting patient from ADLs, IADLs, rest and sleep, and leisure.   COMORBIDITIES: has co-morbidities such as L sided hemiparesis, OA bilat hands  that affects occupational performance. Patient will benefit from skilled OT to address above impairments and improve overall function.  MODIFICATION OR ASSISTANCE TO COMPLETE EVALUATION: No modification of tasks or assist necessary to complete  an evaluation.  OT OCCUPATIONAL PROFILE AND HISTORY: Problem focused assessment: Including review of records relating to presenting problem.  CLINICAL DECISION MAKING: Moderate - several treatment options, min-mod task modification necessary  REHAB POTENTIAL: Good  EVALUATION COMPLEXITY: Low      PLAN:  OT FREQUENCY: 1-2x/week  OT DURATION: 12 weeks  PLANNED INTERVENTIONS: 97168 OT Re-evaluation, 97535 self care/ADL training, 31517 therapeutic exercise, 97530 therapeutic activity, 97140 manual therapy, 97035 ultrasound, 97018 paraffin, 61607 moist heat, 97010 cryotherapy, 97034 contrast bath, 97033 iontophoresis, 97760 Orthotics management and training, 37106 Splinting (initial encounter), [id number] Subsequent splinting/medication, passive range of motion, psychosocial skills training, coping strategies training, patient/family education, and DME and/or AE instructions  RECOMMENDED OTHER SERVICES: None at this time  CONSULTED AND AGREED WITH PLAN OF CARE: Patient  PLAN FOR NEXT SESSION: See above   Danelle Earthly, MS, OTR/L  Otis Dials, OT 04/03/2023, 3:36 PM

## 2023-04-04 ENCOUNTER — Ambulatory Visit (INDEPENDENT_AMBULATORY_CARE_PROVIDER_SITE_OTHER): Payer: Medicare Other

## 2023-04-04 VITALS — Ht 62.0 in | Wt 159.0 lb

## 2023-04-04 DIAGNOSIS — Z0000 Encounter for general adult medical examination without abnormal findings: Secondary | ICD-10-CM

## 2023-04-04 DIAGNOSIS — Z1231 Encounter for screening mammogram for malignant neoplasm of breast: Secondary | ICD-10-CM

## 2023-04-04 NOTE — Patient Instructions (Addendum)
[name] , Thank you for taking time to come for your Medicare Wellness Visit. I appreciate your ongoing commitment to your health goals. Please review the following plan we discussed and let me know if I can assist you in the future.   Referrals/Orders/Follow-Ups/Clinician Recommendations:   You have an order for:  []   2D Mammogram  [x]   3D Mammogram  []   Bone Density     Please call for appointment:  [facility]  [address] [place] [address] [phone]  [facility] [address] [place], [place] [address] [phone]  [facility] [address] Geanie Logan [place], [place] [address] [phone]    Make sure to wear two-piece clothing.  No lotions, powders, or deodorants the day of the appointment. Make sure to bring picture ID and insurance card.  Bring list of medications you are currently taking including any supplements.   Schedule your [place] screening mammogram through MyChart!   Log into your MyChart account.  Go to 'Visit' (or 'Appointments' if on mobile App) --> Schedule an Appointment  Under 'Select a Reason for Visit' choose the Mammogram Screening option.  Complete the pre-visit questions and select the time and place that best fits your schedule.    This is a list of the screening recommended for you and due dates:  Health Maintenance  Topic Date Due   Hepatitis C Screening  Never done   DTaP/Tdap/Td vaccine (2 - Tdap) 06/08/2020   COVID-19 Vaccine (4 - 2024-25 season) 12/11/2022   Flu Shot  07/10/2023*   Medicare Annual Wellness Visit  04/03/2024   Mammogram  05/12/2024   Colon Cancer Screening  02/09/2030   Pneumonia Vaccine  Completed   DEXA scan (bone density measurement)  Completed   HPV Vaccine  Aged Out   Zoster (Shingles) Vaccine  Discontinued  *Topic was postponed. The date shown is not the  original due date.    Advanced directives: (Copy Requested) Please bring a copy of your health care power of attorney and living will to the office to be added to your chart at your convenience.  Next Medicare Annual Wellness Visit scheduled for next year: Yes 04/08/24 @ 10:10am televisit

## 2023-04-04 NOTE — Progress Notes (Signed)
Subjective:   [name] is a 72 y.o. female who presents for Medicare Annual (Subsequent) preventive examination.  Visit Complete: Virtual I connected with  [name] on 04/04/23 by a audio enabled telemedicine application and verified that I am speaking with the correct person using two identifiers.  Patient Location: Home  Provider Location: Home Office  I discussed the limitations of evaluation and management by telemedicine. The patient expressed understanding and agreed to proceed.  Vital Signs: Because this visit was a virtual/telehealth visit, some criteria may be missing or patient reported. Any vitals not documented were not able to be obtained and vitals that have been documented are patient reported.  Patient Medicare AWV questionnaire was completed by the patient on (not done); I have confirmed that all information answered by patient is correct and no changes since this date.  Cardiac Risk Factors include: advanced age (>81men, >23 women);dyslipidemia;hypertension;sedentary lifestyle   Objective:    Today's Vitals   04/04/23 1008 04/04/23 1011  Weight: 159 lb (72.1 kg)   Height: 5\' 2"  (1.575 m)   PainSc:  9    Body mass index is 29.08 kg/m.     04/04/2023   10:25 AM 11/29/2022    6:12 AM 11/11/2022    1:03 PM 02/25/2021    1:07 PM 02/13/2021    2:26 PM 12/12/2018    8:42 AM 12/07/2018    1:28 PM  Advanced Directives  Does Patient Have a Medical Advance Directive? Yes Yes Yes Yes Yes Yes Yes  Type of Estate agent of [place];Living will Healthcare Power of [place];Living will Healthcare Power of [place];Living will Living will Living will Healthcare Power of [place];Living will Healthcare Power of [place];Living will  Does patient want to make changes to medical advance directive?  No - Patient declined  No - Patient declined No - Patient declined No - Patient declined   Copy of Healthcare Power of Attorney in Chart? No - copy  requested No - copy requested No - copy requested   No - copy requested No - copy requested    Current Medications (verified) Outpatient Encounter Medications as of 04/04/2023  Medication Sig   acetaminophen (TYLENOL) 500 MG tablet Take 1,000-1,500 mg by mouth every 8 (eight) hours as needed for moderate pain.   ALPHA LIPOIC ACID PO Take 1,400 mg by mouth daily.   amoxicillin (AMOXIL) 500 MG capsule 2g 1 hour prior to dental procedure   Ascorbic Acid (VITAMIN C) 1000 MG tablet Take 1,000 mg by mouth daily. With zinc   aspirin 81 MG chewable tablet Chew 1 tablet (81 mg total) by mouth 2 (two) times daily.   atenolol-chlorthalidone (TENORETIC) 50-25 MG tablet Take 1 tablet by mouth daily.   b complex vitamins tablet Take 1 tablet by mouth at bedtime.   calcium carbonate (OSCAL) 1500 (600 Ca) MG TABS tablet Take 600 mg of elemental calcium by mouth daily.   Cholecalciferol (D 5000) 5000 units capsule Take 5,000 Units by mouth daily.   Coenzyme Q10 (COQ10) 100 MG CAPS Take 100 mg by mouth every evening.    diclofenac Sodium (VOLTAREN) 1 % GEL Apply 1 Application topically 2 (two) times daily.   diphenoxylate-atropine (LOMOTIL) 2.5-0.025 MG tablet TAKE 1 TABLET BY MOUTH FOUR TIMES A DAY AS NEEDED FOR DIARRHEA OR LOOSE STOOLS (Patient taking differently: Take 1 tablet by mouth 4 (four) times daily as needed for diarrhea or loose stools.)   ELDERBERRY PO Take 250 mg by mouth daily.  folic acid (FOLVITE) 800 MCG tablet Take 800 mcg by mouth every evening.    Glutathione 500 MG CAPS Take 500 mg by mouth daily.   ketotifen (ZADITOR) 0.025 % ophthalmic solution Place 2 drops into both eyes 2 (two) times daily as needed (allergies).   KLOR-CON M20 20 MEQ tablet TAKE 1 TABLET TWICE A DAY   loperamide (IMODIUM A-D) 2 MG tablet Take 2-4 mg by mouth 4 (four) times daily as needed for diarrhea or loose stools.   Melatonin 3 MG TABS Take 3 mg by mouth at bedtime.    METAMUCIL FIBER PO Take 2 tablets by mouth  daily as needed (constipation).   methocarbamol (ROBAXIN) 500 MG tablet TAKE 1 TABLET BY MOUTH EVERY 8 HOURS AS NEEDED FOR MUSCLE SPASMS   Multiple Vitamin (MULTIVITAMIN WITH MINERALS) TABS tablet Take 1 tablet by mouth at bedtime.   Multiple Vitamins-Minerals (ZINC PO) Take 1 Dose by mouth daily. 1 dose = 1 dropper full of liquid zinc   OVER THE COUNTER MEDICATION Take 1 Dose by mouth at bedtime. Lemon Balm   OVER THE COUNTER MEDICATION Take 1 Dose by mouth at bedtime. Cats Claw liquid daily   OVER THE COUNTER MEDICATION Apply 1 Application topically at bedtime. Magnesium lotion   saccharomyces boulardii (FLORASTOR) 250 MG capsule Take 250 mg by mouth daily.   simvastatin (ZOCOR) 20 MG tablet Take 1 tablet (20 mg total) by mouth daily.   Sodium Fluoride (SODIUM FLUORIDE 5000 PPM) 1.1 % PSTE Place 1 application onto teeth at bedtime.   traZODone (DESYREL) 50 MG tablet Take 1 tablet (50 mg total) by mouth at bedtime.   TURMERIC CURCUMIN PO Take 630 mg by mouth daily.   No facility-administered encounter medications on file as of 04/04/2023.    Allergies (verified) Egg-derived products, Etodolac, Fish-derived products, Omeprazole, Pineapple, and Gluten meal   History: Past Medical History:  Diagnosis Date   Arthritis    back, fingers with joint pain and swelling.  chronic back pain   Cataract    Chronic back pain    arthritis    Diverticulosis    Elevated cholesterol    takes Niacin daily and Simvastatin   GERD (gastroesophageal reflux disease) 06/2004   non-specific gastritis on EGD 06/2004   Headache(784.0)    occasionally   History of colon polyps 2004, 2009   2004:adenomatous. 2009 hyperplastic.    History of hiatal hernia    Hypertension    takes Tenoretic and Lisinopril daily   Mucoid cyst of joint 08/2013   right index finger   Neuromuscular disorder (HCC)    numbness in right leg after knee replacement   Osteopenia 01/2014   T score -1.1 FRAX 14%/0.5%. Stable from  prior DEXA   PONV (postoperative nausea and vomiting)    Rotator cuff arthropathy    Left   Seasonal allergies    Sleep apnea    wears CPAP nightly   Stroke (HCC) 1998   x 2 - mild left-sided weakness   Urge incontinence    Uterine prolapse    Past Surgical History:  Procedure Laterality Date   BACK SURGERY     x 2   BELPHAROPTOSIS REPAIR Bilateral 12/14/2017   BUBBLE STUDY  02/16/2021   Procedure: BUBBLE STUDY;  Surgeon: Vesta Mixer, MD;  Location: [facility] ENDOSCOPY;  Service: Cardiovascular;;   CARPAL TUNNEL RELEASE Bilateral    Dr August Saucer   CATARACT EXTRACTION     CATARACT EXTRACTION Bilateral 10/2017   COLONOSCOPY  2004, 2009, 2014   FINGER SURGERY     GUM SURGERY     GYNECOLOGIC CRYOSURGERY     HYSTEROSCOPY WITH D & C  12/07/2010   with resection of endometrial polyp   JOINT REPLACEMENT     KNEE ARTHROSCOPY Left 2008   lip biopsy     done at MD office Fri 11/22/13   MASS EXCISION Right 08/15/2013   Procedure: RIGHT INDEX EXCISION MASS ;  Surgeon: Tami Ribas, MD;  Location: [facility];  Service: Orthopedics;  Laterality: Right;   NASAL SEPTUM SURGERY     OOPHORECTOMY Right 2000   REVERSE SHOULDER ARTHROPLASTY Left 12/11/2018   REVERSE SHOULDER ARTHROPLASTY Left 12/11/2018   Procedure: LEFT REVERSE SHOULDER ARTHROPLASTY;  Surgeon: Cammy Copa, MD;  Location: [facility] OR;  Service: Orthopedics;  Laterality: Left;   SHOULDER ARTHROSCOPY WITH OPEN ROTATOR CUFF REPAIR AND DISTAL CLAVICLE ACROMINECTOMY Right 11/26/2013   Procedure: RIGHT SHOULDER ARTHROSCOPY WITH MINI OPEN ROTATOR CUFF REPAIR AND DISTAL CLAVICLE RESECTION, SUBACROMIAL DECOMPRESSION, POSSIBLE [facility] PATCH.;  Surgeon: Valeria Batman, MD;  Location: [facility] OR;  Service: Orthopedics;  Laterality: Right;   TEE WITHOUT CARDIOVERSION N/A 02/16/2021   Procedure: TRANSESOPHAGEAL ECHOCARDIOGRAM (TEE);  Surgeon: Elease Hashimoto Deloris Ping, MD;  Location[facility] END[facility]COPY;  Service: Cardiovascular;  Laterality: N/A;    TOTAL KNEE ARTHROPLASTY Right 03/21/2017   TOTAL KNEE ARTHROPLASTY Right 03/21/2017   Procedure: RIGHT TOTAL KNEE ARTHROPLASTY;  Surgeon: Valeria Batman, MD;  Location: [facility] OR;  Service: Orthopedics;  Laterality: Right;   TOTAL KNEE ARTHROPLASTY Left 11/29/2022   Procedure: LEFT TOTAL KNEE ARTHROPLASTY;  Surgeon: Cammy Copa, MD;  Location: [facility] ORS;  Service: Orthopedics;  Laterality: Left;   TUBAL LIGATION     Family History  Problem Relation Age of Onset   Hypertension Mother    Heart disease Mother    Diabetes Father    Hypertension Father    Heart disease Father    Stroke Father    Diabetes Maternal Aunt    Diabetes Paternal Grandmother    Hypertension Paternal Grandfather    Stroke Paternal Grandfather    Colon cancer Neg Hx    Esophageal cancer Neg Hx    Rectal cancer Neg Hx    Stomach cancer Neg Hx    Sleep apnea Neg Hx    Social History   Socioeconomic History   Marital status: Married    Spouse name: Not on file   Number of children: 2   Years of education: 12   Highest education level: Not on file  Occupational History   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  Other Topics Concern   Not on file  Social History Narrative   Thasha grew up in [place], [place]. She lives with her husband Karen Kitchens). She has 1 daughter Efraim Kaufmann), 1 son Lenard Galloway) and 2 stepchildren (Benny & Gilda Crease). They have 3 dogs ([name], Beaver, [name]) and 3 cats (Sissy,  Hedi, Genola). She enjoys watching  movies and outdoors activities.Exercise - noneCaffeine - varies depending on the day. May have between 1-4 cups daily.  Rare Soda.    Social Drivers of Corporate investment banker Strain: Low Risk  (04/04/2023)   Overall Financial Resource Strain (CARDIA)    Difficulty of Paying Living Expenses: Not hard at all  Food Insecurity: No Food Insecurity (04/04/2023)   Hunger Vital Sign    Worried About Running Out of Food in the Last Year: Never true    Ran Out of Food in the Last Year: Never true  Transportation Needs: No Transportation Needs (04/04/2023)   PRAPARE - Administrator, Civil Service (Medical): No    Lack of Transportation (Non-Medical): No  Physical Activity: Inactive (04/04/2023)   Exercise Vital Sign    Days of Exercise per Week: 0 days    Minutes of Exercise per Session: 0 min  Stress: No Stress Concern Present (04/04/2023)   [employer] of Occupational Health - Occupational Stress Questionnaire    Feeling of Stress : Not at all  Social Connections: Moderately Isolated (04/04/2023)   Social Connection and Isolation Panel [NHANES]    Frequency of Communication with Friends and Family: More than three times a week    Frequency of Social Gatherings with Friends and Family: Three times a week    Attends Religious Services: Never    Active Member of Clubs or Organizations: No    Attends Banker Meetings: Never    Marital Status: Married    Tobacco Counseling Counseling given: Not Answered  Clinical Intake:  Pre-visit preparation completed: No  Pain : 0-10 Pain Score: 9  Pain Type: Acute pain Pain Location: Heel (lft knee, rt wrist; rt foot numb, back stiffness and sone pain) Pain Orientation: Left Pain Descriptors / Indicators: Aching, Sharp, Throbbing Pain Onset: In the past 7 days Pain Frequency: Constant Pain Relieving Factors: ice, tylenol  Pain Relieving Factors: ice, tylenol  BMI - recorded: 29.08 Nutritional Status: BMI  25 -29 Overweight Nutritional Risks: None Diabetes: No  How often do you need to have someone help you when you read instructions, pamphlets, or other written materials from your doctor or pharmacy?: 1 - Never  Interpreter Needed?: No  Comments: lives with husband Information entered by :: B.Ramar Nobrega,LPN   Activities of Daily Living    04/04/2023   10:25 AM 11/29/2022   11:42 AM  In your present state of health, do you have any difficulty performing the following activities:  Hearing? 0 0  Vision? 0 0  Difficulty concentrating or making decisions? 0 0  Walking or climbing stairs? 0 0  Dressing or bathing? 0 0  Doing errands, shopping? 0 0  Preparing Food and eating ? N   Using the Toilet? N   In the past six months, have you accidently leaked urine? N   Do you have problems with loss of bowel control? N   Managing your Medications? N   Managing your Finances? N   Housekeeping or managing your Housekeeping? N     Patient Care Team: Myrlene Broker, MD as PCP - General (Internal Medicine) Jodelle Red, MD as PCP - Cardiology (Cardiology)  Indicate any recent Medical Services you may have received from other than [facility] providers in the past year (date may be approximate).     Assessment:   This is a routine wellness examination for [place].  Hearing/Vision screen Hearing Screening - Comments:: Pt says her  hearing is good Vision Screening - Comments:: Pt says her vision is good w/glasses [facility]   Goals Addressed               This Visit's Progress     Patient Stated (pt-stated)   On track     To be bale to fully recover after Knee Surgery        Depression Screen    04/04/2023   10:23 AM 03/24/2023   11:07 AM 11/09/2022    8:34 AM 11/07/2022   11:16 AM 09/22/2022   11:03 AM 04/07/2022    2:02 PM 03/25/2022   10:43 AM  PHQ 2/9 Scores  PHQ - 2 Score 0 0 0 0 0 0 0  PHQ- 9 Score  0   0 0 0    Fall Risk    04/04/2023    10:18 AM 03/24/2023   11:07 AM 11/09/2022    8:34 AM 11/07/2022   11:16 AM 09/22/2022   11:03 AM  Fall Risk   Falls in the past year? 1 0 0 0 0  Comment 11/30/22      Number falls in past yr: 0 0 0 0 0  Injury with Fall? 1 0 0 0 0  Comment fractured wrist from fall in hospital      Risk for fall due to : No Fall Risks  No Fall Risks No Fall Risks   Follow up Education provided;Falls prevention discussed Falls evaluation completed Falls evaluation completed Falls evaluation completed Falls evaluation completed    MEDICARE RISK AT HOME: Medicare Risk at Home Any stairs in or around the home?: Yes If so, are there any without handrails?: Yes Home free of loose throw rugs in walkways, pet beds, electrical cords, etc?: Yes Adequate lighting in your home to reduce risk of falls?: Yes Life alert?: No Use of a cane, walker or w/c?: Yes (cane) Grab bars in the bathroom?: Yes Shower chair or bench in shower?: Yes Elevated toilet seat or a handicapped toilet?: Yes  TIMED UP AND GO:  Was the test performed?  No    Cognitive Function:        04/04/2023   10:26 AM  6CIT Screen  What Year? 0 points  What month? 0 points  What time? 0 points  Count back from 20 0 points  Months in reverse 0 points  Repeat phrase 0 points  Total Score 0 points    Immunizations Immunization History  Administered Date(s) Administered   Influenza Inj Mdck Quad Pf 01/11/2019   Influenza Whole 02/25/2004, 01/21/2009   PFIZER(Purple Top)SARS-COV-2 Vaccination 05/26/2019, 06/18/2019, 03/03/2020   Pneumococcal Conjugate-13 12/08/2015   Pneumococcal Polysaccharide-23 01/02/2017   Td 06/09/2010    TDAP status: Up to date  Flu Vaccine status: Declined, Education has been provided regarding the importance of this vaccine but patient still declined. Advised may receive this vaccine at local pharmacy or Health Dept. Aware to provide a copy of the vaccination record if obtained from local pharmacy or Health  Dept. Verbalized acceptance and understanding.  Pneumococcal vaccine status: Up to date  Covid-19 vaccine status: Completed vaccines  Qualifies for Shingles Vaccine? Yes   Zostavax completed No   Shingrix Completed?: No.    Education has been provided regarding the importance of this vaccine. Patient has been advised to call insurance company to determine out of pocket expense if they have not yet received this vaccine. Advised may also receive vaccine at local pharmacy or Health  Dept. Verbalized acceptance and understanding.  Screening Tests Health Maintenance  Topic Date Due   Hepatitis C Screening  Never done   DTaP/Tdap/Td (2 - Tdap) 06/08/2020   COVID-19 Vaccine (4 - 2024-25 season) 12/11/2022   INFLUENZA VACCINE  07/10/2023 (Originally 11/10/2022)   Medicare Annual Wellness (AWV)  04/03/2024   MAMMOGRAM  05/12/2024   Colonoscopy  02/09/2030   Pneumonia Vaccine 7+ Years old  Completed   DEXA SCAN  Completed   HPV VACCINES  Aged Out   Zoster Vaccines- Shingrix  Discontinued    Health Maintenance  Health Maintenance Due  Topic Date Due   Hepatitis C Screening  Never done   DTaP/Tdap/Td (2 - Tdap) 06/08/2020   COVID-19 Vaccine (4 - 2024-25 season) 12/11/2022    Colorectal cancer screening: Type of screening: Colonoscopy. Completed 02/10/2020. Repeat every 10 years  Mammogram status: Ordered yes. Pt provided with contact info and advised to call to schedule appt.   Bone Density status: Completed 09/30/2021. Results reflect: Bone density results: NORMAL. Repeat every 5 years.  Lung Cancer Screening: (Low Dose CT Chest recommended if Age 32-80 years, 20 pack-year currently smoking OR have quit w/in 15years.) does not qualify.   Lung Cancer Screening Referral: no  Additional Screening:  Hepatitis C Screening: does not qualify; Completed no  Vision Screening: Recommended annual ophthalmology exams for early detection of glaucoma and other disorders of the eye. Is the  patient up to date with their annual eye exam?  Yes  Who is the provider or what is the name of the office in which the patient attends annual eye exams? [facility]-Dr Lew Dawes pt unsure If pt is not established with a provider, would they like to be referred to a provider to establish care? No .   Dental Screening: Recommended annual dental exams for proper oral hygiene  Diabetic Foot Exam: n/a  Community Resource Referral / Chronic Care Management: CRR required this visit?  No   CCM required this visit?  No     Plan:     I have personally reviewed and noted the following in the patient's chart:   Medical and social history Use of alcohol, tobacco or illicit drugs  Current medications and supplements including opioid prescriptions. Patient is not currently taking opioid prescriptions. Functional ability and status Nutritional status Physical activity Advanced directives List of other physicians Hospitalizations, surgeries, and ER visits in previous 12 months Vitals Screenings to include cognitive, depression, and falls Referrals and appointments  In addition, I have reviewed and discussed with patient certain preventive protocols, quality metrics, and best practice recommendations. A written personalized care plan for preventive services as well as general preventive health recommendations were provided to patient.     Sue Lush, LPN   [id number]   After Visit Summary: (MyChart) Due to this being a telephonic visit, the after visit summary with patients personalized plan was offered to patient via MyChart   Nurse Notes: Pt relays managing pain from fall/wrist injury and other varied pain. She has no concerns or questions at this time.  *MMG order placed due in 2/25

## 2023-04-13 ENCOUNTER — Ambulatory Visit: Payer: Medicare Other | Attending: Orthopedic Surgery

## 2023-04-13 DIAGNOSIS — M25531 Pain in right wrist: Secondary | ICD-10-CM | POA: Diagnosis not present

## 2023-04-13 DIAGNOSIS — R278 Other lack of coordination: Secondary | ICD-10-CM | POA: Insufficient documentation

## 2023-04-13 DIAGNOSIS — M25631 Stiffness of right wrist, not elsewhere classified: Secondary | ICD-10-CM | POA: Insufficient documentation

## 2023-04-13 DIAGNOSIS — M6281 Muscle weakness (generalized): Secondary | ICD-10-CM | POA: Diagnosis not present

## 2023-04-13 NOTE — Therapy (Signed)
 OUTPATIENT OCCUPATIONAL THERAPY ORTHO TREATMENT NOTE  Patient Name: [name] MRN: [id number] DOB:16-Dec-1950, 73 y.o., female Today's Date: 04/13/2023  PCP: Dr. Almarie Haley REFERRING PROVIDER: Dr. Cordella Haley  END OF SESSION:  OT End of Session - 04/13/23 1020     Visit Number 4    Number of Visits 12    Date for OT Re-Evaluation 06/14/23    Progress Note Due on Visit 10    OT Start Time 1015    OT Stop Time 1100    OT Time Calculation (min) 45 min    Activity Tolerance Patient tolerated treatment well    Behavior During Therapy WFL for tasks assessed/performed            Past Medical History:  Diagnosis Date   Arthritis    back, fingers with joint pain and swelling.  chronic back pain   Cataract    Chronic back pain    arthritis    Diverticulosis    Elevated cholesterol    takes Niacin daily and Simvastatin    GERD (gastroesophageal reflux disease) 06/2004   non-specific gastritis on EGD 06/2004   Headache(784.0)    occasionally   History of colon polyps 2004, 2009   2004:adenomatous. 2009 hyperplastic.    History of hiatal hernia    Hypertension    takes Tenoretic  and Lisinopril  daily   Mucoid cyst of joint 08/2013   right index finger   Neuromuscular disorder (HCC)    numbness in right leg after knee replacement   Osteopenia 01/2014   T score -1.1 FRAX 14%/0.5%. Stable from prior DEXA   PONV (postoperative nausea and vomiting)    Rotator cuff arthropathy    Left   Seasonal allergies    Sleep apnea    wears CPAP nightly   Stroke (HCC) 1998   x 2 - mild left-sided weakness   Urge incontinence    Uterine prolapse    Past Surgical History:  Procedure Laterality Date   BACK SURGERY     x 2   BELPHAROPTOSIS REPAIR Bilateral 12/14/2017   BUBBLE STUDY  02/16/2021   Procedure: BUBBLE STUDY;  Surgeon: Alveta Aleene PARAS, MD;  Location: [facility] ENDOSCOPY;  Service: Cardiovascular;;   CARPAL TUNNEL RELEASE Bilateral    Dr Haley   CATARACT EXTRACTION      CATARACT EXTRACTION Bilateral 10/2017   COLONOSCOPY  2004, 2009, 2014   FINGER SURGERY     GUM SURGERY     GYNECOLOGIC CRYOSURGERY     HYSTEROSCOPY WITH D & C  12/07/2010   with resection of endometrial polyp   JOINT REPLACEMENT     KNEE ARTHROSCOPY Left 2008   lip biopsy     done at MD office Fri 11/22/13   MASS EXCISION Right 08/15/2013   Procedure: RIGHT INDEX EXCISION MASS ;  Surgeon: Franky JONELLE Curia, MD;  Location: [facility];  Service: Orthopedics;  Laterality: Right;   NASAL SEPTUM SURGERY     OOPHORECTOMY Right 2000   REVERSE SHOULDER ARTHROPLASTY Left 12/11/2018   REVERSE SHOULDER ARTHROPLASTY Left 12/11/2018   Procedure: LEFT REVERSE SHOULDER ARTHROPLASTY;  Surgeon: Haley Stacy Hamilton, MD;  Location: [facility] OR;  Service: Orthopedics;  Laterality: Left;   SHOULDER ARTHROSCOPY WITH OPEN ROTATOR CUFF REPAIR AND DISTAL CLAVICLE ACROMINECTOMY Right 11/26/2013   Procedure: RIGHT SHOULDER ARTHROSCOPY WITH MINI OPEN ROTATOR CUFF REPAIR AND DISTAL CLAVICLE RESECTION, SUBACROMIAL DECOMPRESSION, POSSIBLE [facility] PATCH.;  Surgeon: Maude LELON Right, MD;  Location: [facility] OR;  Service: Orthopedics;  Laterality: Right;   TEE WITHOUT CARDIOVERSION N/A 02/16/2021   Procedure: TRANSESOPHAGEAL ECHOCARDIOGRAM (TEE);  Surgeon: Alveta Aleene PARAS, MD;  Location: [facility] ENDOSCOPY;  Service: Cardiovascular;  Laterality: N/A;   TOTAL KNEE ARTHROPLASTY Right 03/21/2017   TOTAL KNEE ARTHROPLASTY Right 03/21/2017   Procedure: RIGHT TOTAL KNEE ARTHROPLASTY;  Surgeon: Anderson Maude ORN, MD;  Location: [facility] OR;  Service: Orthopedics;  Laterality: Right;   TOTAL KNEE ARTHROPLASTY Left 11/29/2022   Procedure: LEFT TOTAL KNEE ARTHROPLASTY;  Surgeon: Addie Stacy Hamilton, MD;  Location: [facility] ORS;  Service: Orthopedics;  Laterality: Left;   TUBAL LIGATION     Patient Active Problem List   Diagnosis Date Noted   Unintentional weight loss 03/24/2023   S/P total knee replacement, left 11/29/2022   Urinary  incontinence 03/25/2022   ICH (intracerebral hemorrhage) (HCC) 02/13/2021   Iron  deficiency 10/07/2020   Aortic atherosclerosis (HCC) 03/17/2020   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   Chronic left shoulder pain 08/23/2018   Arthritis of left knee 11/02/2016   Chronic diarrhea 12/07/2014   Segmental colitis with rectal bleeding (HCC) 09/12/2014   Routine general medical examination at a health care facility 06/05/2014   Overweight 06/13/2011   Hyperlipidemia 02/17/2007   Essential hypertension 01/08/2007   Osteoarthritis 01/08/2007   ONSET DATE: 11/30/22  REFERRING DIAG: Pain in R wrist; closed intra-articular fracture of distal end of right radius  THERAPY DIAG:  Muscle weakness (generalized)  Other lack of coordination  Pain in right wrist  Stiffness of right wrist joint  Rationale for Evaluation and Treatment: Rehabilitation  SUBJECTIVE:   SUBJECTIVE STATEMENT: Pt reports R wrist to continue to be sore, but is starting to feel a littler looser and pt states she's trying to use her R hand more. Pt accompanied by: self  PERTINENT HISTORY:  Per MEDICAL RECORD NUMBERNancy B Haley is a 73 y.o. female who presents to the office reporting decreasing pain from right wrist fracture.  She has seen another hand surgeon for evaluation of the right wrist and they have recommended occupational therapy which has been arranged.   After 2 negative xrays, MRI confirmed: Other closed intra-articular fracture of distal end of right radius, initial encounter (Primary Dx);  Additional PMH of: Primary osteoarthritis of right wrist;  Primary osteoarthritis of right hand  Carpal tunnel release B hands ~1.5 years ago Hx of CVA, treated in outpatient OT by this clinician  PRECAUTIONS: Other: ok to wear the wrist brace for comfort as needed  RED FLAGS: None   WEIGHT BEARING RESTRICTIONS: No  PAIN:  Are you having pain? Yes: NPRS scale: 3 at rest, with activity 6/10 Pain location:  dorsal wrist and hand, radial aspect Pain description: tight and a little sharp, a little achy Aggravating factors: weight bearing through the hand and movement Relieving factors: rest, Voltaren  gel may help somewhat, OTC pain meds  FALLS: Has patient fallen in last 6 months? Yes. Number of falls 1 fall (cause of injury)  LIVING ENVIRONMENT: Lives with: lives with their spouse Lives in: 3 level home (stair lift to bonus room), pt gets on her gator (ATV) and goes around the house to access the basement Stairs: Yes: External: 2 steps; on right going up Has following equipment at home: Quad cane small base, shower chair, elevated toilet seat, grab bar at the toilet   PLOF: Independent  PATIENT GOALS: To be able to use my R hand for my daily activities.  I'm R handed so I want to be as good  as I can.   NEXT MD VISIT: Kurt Going to have an MRI for low back d/t worsening numbness in the R Leg.    OBJECTIVE:  Note: Objective measures were completed at Evaluation unless otherwise noted.  HAND DOMINANCE: Right  ADLs: Overall ADLs: modified indep with basic self care prior to injury.  Spouse currently assisting with opening bottles.  Pt endorses difficulty washing under L arm pit d/t lack of wrist mobility, spouse assists with lifting, carrying heavier items in kitchen.  Difficulty with tasks that require bending, pulling, or pushing from the wrist.  Difficulty putting in earrings.   FUNCTIONAL OUTCOME MEASURES: FOTO: 43; predicted 60  UPPER EXTREMITY ROM:     Active ROM Right eval Left eval  Shoulder flexion    Shoulder abduction    Shoulder adduction    Shoulder extension    Shoulder internal rotation    Shoulder external rotation    Elbow flexion    Elbow extension    Wrist flexion 29 49  Wrist extension 39 58  Wrist ulnar deviation 29 54  Wrist radial deviation 14 31  Wrist pronation 84 88  Wrist supination 64 76  (Blank rows = not tested)  Active ROM Right eval  Left eval  Thumb MCP (0-60)    Thumb IP (0-80)    Thumb Radial abd/add (0-55)     Thumb Palmar abd/add (0-45)     Thumb Opposition to Small Finger     Index MCP (0-90)     Index PIP (0-100)     Index DIP (0-70)      Long MCP (0-90)      Long PIP (0-100)      Long DIP (0-70)      Ring MCP (0-90)      Ring PIP (0-100)      Ring DIP (0-70)      Little MCP (0-90)      Little PIP (0-100)      Little DIP (0-70)      (Blank rows = not tested)   Full composite fist bilaterally, slightly painful on the R with full closure   Able to oppose all digits to thumb in bilat hands, some pain with R hand digit opposition  UPPER EXTREMITY MMT:     MMT Right eval Left eval  Shoulder flexion    Shoulder abduction    Shoulder adduction    Shoulder extension    Shoulder internal rotation    Shoulder external rotation    Middle trapezius    Lower trapezius    Elbow flexion    Elbow extension    Wrist flexion 4- 4+  Wrist extension 4- 4+  Wrist ulnar deviation 4- 4+  Wrist radial deviation 4- 4+  Wrist pronation 4- 4+  Wrist supination 4- 4+  (Blank rows = not tested)  HAND FUNCTION: Grip strength: Right: 21 lbs; Left: 20 (L side hx of shoulder replacement and affected by CVA) lbs, Lateral pinch: Right: 11 lbs, Left: 8 lbs, and 3 point pinch: Right: 5 lbs, Left: 6 lbs  COORDINATION: 9 Hole Peg test: Right: 41 sec; Left: 43 sec  SENSATION: R hand WFL, L hand thumb and index finger mildly numb (but improved since carpal tunnel release)  EDEMA: very mild swelling in R dorsal wrist   COGNITION: Overall cognitive status: Within functional limits for tasks assessed  OBSERVATIONS:  Pt known to this OT from previous OT to tx LUE weakness post CVA in 2022.  Pt  eager to increase function in R hand post wrist fx as she is R hand dominant.  Pt requests 1x per week d/t pt living in [place] which requires an extended drive to this Clinic.  Pt historically adherent to HEPs so anticipate that 1x  per week should be acceptable.  TODAY'S TREATMENT:                                                                                                                              DATE: 04/13/23: Paraffin:  X10 min for R wrist/hand pain management/muscle relaxation in prep for therapeutic exercises.  Manual Therapy: -Performed soft tissue massage to R thumb, all sides of wrist and forearm with focus to radial side, working to decrease pain and increase tissue extensibility for improving joint range in the wrist and hand. -Performed grade 1 and 2 joint mobs to R wrist and forearm all planes, working to decrease pain and increase mobility to wrist and forearm joint ranges.   Therapeutic Exercise: Performed passive wrist and forearm stretches, using heat around wrist and forearm throughout, focusing on wrist flex and ext, forearm supination, and radial/ulnar deviation.   Performed R thumb PA and RA passively to tolerance.  Instructed in self ROM techniques for same.  PATIENT EDUCATION: Education details: HEP Person educated: Patient Education method: Explanation Education comprehension: verbalized understanding  HOME EXERCISE PROGRAM: Light blue theraputty, self passive wrist and forearm ROM, AROM  GOALS: Goals reviewed with patient? Yes  SHORT TERM GOALS: Target date: 05/03/23  Pt will be indep to perform HEP for improving R wrist flexibility and strength for daily tasks. Baseline: Eval: Initiated theraputty and passive wrist flexibility exercises at eval; min vc with handouts Goal status: INITIAL  LONG TERM GOALS: Target date: 07/04/23  Pt will increase FOTO score to 60 or better to indicate improvement in self perceived functional use of the R hand with daily tasks. Baseline: Eval: 43 Goal status: INITIAL  2.  Pt will increase R grip strength by 10 or more lbs to enable indep with opening jars/containers.  Baseline: Eval: R grip 21 lbs (spouse assists to open jars) Goal status:  INITIAL  3.  Pt will increase R wrist flex/ext/supination by 7-10* to enable sufficient mobility to engage R dominant hand during grooming and eating.   Baseline: Eval: R wrist flex 29, ext 39, sup 64 (pt reports inability to put in earrings and difficulty bringing eating utensils to mouth) Goal status: INITIAL  4.  Pt will increase R wrist strength by 1/2 or more muscle grades in order to use R dominant hand for refilling dog food and water  dishes.  Baseline: Eval: R wrist strength grossly 4-; pt uses L non-dominant hand to refill dog food and water  dishes.  Goal status: INITIAL  ASSESSMENT: CLINICAL IMPRESSION:  Pt reports R wrist to continue to be sore, but is starting to feel a littler looser and pt states she's trying to use her R hand  more.  Pt with good tolerance to paraffin, manual therapy and gentle passive stretching this date, and reports less sharp pain at end range wrist stretches.  Pt continues to demo improved joint range for wrist flexion and extension.  Pt continues to acknowledge good benefits from use of heat and was encouraged to continue to use heat and perform putty exercises and stretches to tolerance at home.  Pt will continue to benefit from skilled OT to improve R wrist pain, R wrist mobility, and strength in order to increase functional use of the R dominant hand for daily tasks.   PERFORMANCE DEFICITS: in functional skills including ADLs, IADLs, coordination, sensation, edema, ROM, strength, pain, flexibility, Fine motor control, body mechanics, decreased knowledge of use of DME, and UE functional use, and psychosocial skills including coping strategies, environmental adaptation, habits, and routines and behaviors.   IMPAIRMENTS: are limiting patient from ADLs, IADLs, rest and sleep, and leisure.   COMORBIDITIES: has co-morbidities such as L sided hemiparesis, OA bilat hands  that affects occupational performance. Patient will benefit from skilled OT to address above  impairments and improve overall function.  MODIFICATION OR ASSISTANCE TO COMPLETE EVALUATION: No modification of tasks or assist necessary to complete an evaluation.  OT OCCUPATIONAL PROFILE AND HISTORY: Problem focused assessment: Including review of records relating to presenting problem.  CLINICAL DECISION MAKING: Moderate - several treatment options, min-mod task modification necessary  REHAB POTENTIAL: Good  EVALUATION COMPLEXITY: Low      PLAN:  OT FREQUENCY: 1-2x/week  OT DURATION: 12 weeks  PLANNED INTERVENTIONS: 97168 OT Re-evaluation, 97535 self care/ADL training, 02889 therapeutic exercise, 97530 therapeutic activity, 97140 manual therapy, 97035 ultrasound, 97018 paraffin, 02989 moist heat, 97010 cryotherapy, 97034 contrast bath, 97033 iontophoresis, 97760 Orthotics management and training, 02239 Splinting (initial encounter), [id number] Subsequent splinting/medication, passive range of motion, psychosocial skills training, coping strategies training, patient/family education, and DME and/or AE instructions  RECOMMENDED OTHER SERVICES: None at this time  CONSULTED AND AGREED WITH PLAN OF CARE: Patient  PLAN FOR NEXT SESSION: See above   Inocente Blazing, MS, OTR/L  Inocente MARLA Blazing, OT 04/13/2023, 11:07 AM

## 2023-04-18 ENCOUNTER — Ambulatory Visit: Payer: Medicare Other

## 2023-04-18 DIAGNOSIS — M25531 Pain in right wrist: Secondary | ICD-10-CM | POA: Diagnosis not present

## 2023-04-18 DIAGNOSIS — M25631 Stiffness of right wrist, not elsewhere classified: Secondary | ICD-10-CM

## 2023-04-18 DIAGNOSIS — M6281 Muscle weakness (generalized): Secondary | ICD-10-CM

## 2023-04-18 DIAGNOSIS — R278 Other lack of coordination: Secondary | ICD-10-CM | POA: Diagnosis not present

## 2023-04-18 NOTE — Therapy (Signed)
 OUTPATIENT OCCUPATIONAL THERAPY ORTHO TREATMENT NOTE  Patient Name: [name] MRN: [id number] DOB:09/18/1950, 73 y.o., female Today's Date: 04/18/2023  PCP: Dr. Almarie Cleveland REFERRING PROVIDER: Dr. Cordella Hutchinson  END OF SESSION:  OT End of Session - 04/18/23 1545     Visit Number 5    Number of Visits 12    Date for OT Re-Evaluation 06/14/23    Progress Note Due on Visit 10    OT Start Time 1540    OT Stop Time 1625    OT Time Calculation (min) 45 min    Activity Tolerance Patient tolerated treatment well    Behavior During Therapy WFL for tasks assessed/performed            Past Medical History:  Diagnosis Date   Arthritis    back, fingers with joint pain and swelling.  chronic back pain   Cataract    Chronic back pain    arthritis    Diverticulosis    Elevated cholesterol    takes Niacin daily and Simvastatin    GERD (gastroesophageal reflux disease) 06/2004   non-specific gastritis on EGD 06/2004   Headache(784.0)    occasionally   History of colon polyps 2004, 2009   2004:adenomatous. 2009 hyperplastic.    History of hiatal hernia    Hypertension    takes Tenoretic  and Lisinopril  daily   Mucoid cyst of joint 08/2013   right index finger   Neuromuscular disorder (HCC)    numbness in right leg after knee replacement   Osteopenia 01/2014   T score -1.1 FRAX 14%/0.5%. Stable from prior DEXA   PONV (postoperative nausea and vomiting)    Rotator cuff arthropathy    Left   Seasonal allergies    Sleep apnea    wears CPAP nightly   Stroke (HCC) 1998   x 2 - mild left-sided weakness   Urge incontinence    Uterine prolapse    Past Surgical History:  Procedure Laterality Date   BACK SURGERY     x 2   BELPHAROPTOSIS REPAIR Bilateral 12/14/2017   BUBBLE STUDY  02/16/2021   Procedure: BUBBLE STUDY;  Surgeon: Alveta Aleene PARAS, MD;  Location: [facility] ENDOSCOPY;  Service: Cardiovascular;;   CARPAL TUNNEL RELEASE Bilateral    Dr Hutchinson   CATARACT EXTRACTION      CATARACT EXTRACTION Bilateral 10/2017   COLONOSCOPY  2004, 2009, 2014   FINGER SURGERY     GUM SURGERY     GYNECOLOGIC CRYOSURGERY     HYSTEROSCOPY WITH D & C  12/07/2010   with resection of endometrial polyp   JOINT REPLACEMENT     KNEE ARTHROSCOPY Left 2008   lip biopsy     done at MD office Fri 11/22/13   MASS EXCISION Right 08/15/2013   Procedure: RIGHT INDEX EXCISION MASS ;  Surgeon: Franky JONELLE Curia, MD;  Location: [facility];  Service: Orthopedics;  Laterality: Right;   NASAL SEPTUM SURGERY     OOPHORECTOMY Right 2000   REVERSE SHOULDER ARTHROPLASTY Left 12/11/2018   REVERSE SHOULDER ARTHROPLASTY Left 12/11/2018   Procedure: LEFT REVERSE SHOULDER ARTHROPLASTY;  Surgeon: Hutchinson Cordella Hamilton, MD;  Location: [facility] OR;  Service: Orthopedics;  Laterality: Left;   SHOULDER ARTHROSCOPY WITH OPEN ROTATOR CUFF REPAIR AND DISTAL CLAVICLE ACROMINECTOMY Right 11/26/2013   Procedure: RIGHT SHOULDER ARTHROSCOPY WITH MINI OPEN ROTATOR CUFF REPAIR AND DISTAL CLAVICLE RESECTION, SUBACROMIAL DECOMPRESSION, POSSIBLE [facility] PATCH.;  Surgeon: Maude LELON Right, MD;  Location: [facility] OR;  Service: Orthopedics;  Laterality: Right;   TEE WITHOUT CARDIOVERSION N/A 02/16/2021   Procedure: TRANSESOPHAGEAL ECHOCARDIOGRAM (TEE);  Surgeon: Alveta Aleene PARAS, MD;  Location: [facility] ENDOSCOPY;  Service: Cardiovascular;  Laterality: N/A;   TOTAL KNEE ARTHROPLASTY Right 03/21/2017   TOTAL KNEE ARTHROPLASTY Right 03/21/2017   Procedure: RIGHT TOTAL KNEE ARTHROPLASTY;  Surgeon: Anderson Maude ORN, MD;  Location: [facility] OR;  Service: Orthopedics;  Laterality: Right;   TOTAL KNEE ARTHROPLASTY Left 11/29/2022   Procedure: LEFT TOTAL KNEE ARTHROPLASTY;  Surgeon: Addie Cordella Hamilton, MD;  Location: [facility] ORS;  Service: Orthopedics;  Laterality: Left;   TUBAL LIGATION     Patient Active Problem List   Diagnosis Date Noted   Unintentional weight loss 03/24/2023   S/P total knee replacement, left 11/29/2022   Urinary  incontinence 03/25/2022   ICH (intracerebral hemorrhage) (HCC) 02/13/2021   Iron  deficiency 10/07/2020   Aortic atherosclerosis (HCC) 03/17/2020   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   Chronic left shoulder pain 08/23/2018   Arthritis of left knee 11/02/2016   Chronic diarrhea 12/07/2014   Segmental colitis with rectal bleeding (HCC) 09/12/2014   Routine general medical examination at a health care facility 06/05/2014   Overweight 06/13/2011   Hyperlipidemia 02/17/2007   Essential hypertension 01/08/2007   Osteoarthritis 01/08/2007   ONSET DATE: 11/30/22  REFERRING DIAG: Pain in R wrist; closed intra-articular fracture of distal end of right radius  THERAPY DIAG:  Muscle weakness (generalized)  Pain in right wrist  Stiffness of right wrist joint  Rationale for Evaluation and Treatment: Rehabilitation  SUBJECTIVE:  SUBJECTIVE STATEMENT: Pt reported a significant increase in R wrist and hand pain last night and this morning, but pain improved after taking her tylenol  arthritis medication and putting heat on the arm. Pt accompanied by: self  PERTINENT HISTORY:  Per MEDICAL RECORD NUMBERNancy B Haley is a 73 y.o. female who presents to the office reporting decreasing pain from right wrist fracture.  She has seen another hand surgeon for evaluation of the right wrist and they have recommended occupational therapy which has been arranged.   After 2 negative xrays, MRI confirmed: Other closed intra-articular fracture of distal end of right radius, initial encounter (Primary Dx);  Additional PMH of: Primary osteoarthritis of right wrist;  Primary osteoarthritis of right hand  Carpal tunnel release B hands ~1.5 years ago Hx of CVA, treated in outpatient OT by this clinician  PRECAUTIONS: Other: ok to wear the wrist brace for comfort as needed  RED FLAGS: None   WEIGHT BEARING RESTRICTIONS: No  PAIN: 04/18/23: 3/10 R wrist (last night and this morning 7-8/10 pain)  Are you  having pain? Yes: NPRS scale: 3 at rest, with activity 6/10 Pain location: dorsal wrist and hand, radial aspect Pain description: tight and a little sharp, a little achy Aggravating factors: weight bearing through the hand and movement Relieving factors: rest, Voltaren  gel may help somewhat, OTC pain meds  FALLS: Has patient fallen in last 6 months? Yes. Number of falls 1 fall (cause of injury)  LIVING ENVIRONMENT: Lives with: lives with their spouse Lives in: 3 level home (stair lift to bonus room), pt gets on her gator (ATV) and goes around the house to access the basement Stairs: Yes: External: 2 steps; on right going up Has following equipment at home: Quad cane small base, shower chair, elevated toilet seat, grab bar at the toilet   PLOF: Independent  PATIENT GOALS: To be able to use my R hand for my daily activities.  I'm R  handed so I want to be as good as I can.   NEXT MD VISIT: Kurt Going to have an MRI for low back d/t worsening numbness in the R Leg.    OBJECTIVE:  Note: Objective measures were completed at Evaluation unless otherwise noted.  HAND DOMINANCE: Right  ADLs: Overall ADLs: modified indep with basic self care prior to injury.  Spouse currently assisting with opening bottles.  Pt endorses difficulty washing under L arm pit d/t lack of wrist mobility, spouse assists with lifting, carrying heavier items in kitchen.  Difficulty with tasks that require bending, pulling, or pushing from the wrist.  Difficulty putting in earrings.   FUNCTIONAL OUTCOME MEASURES: FOTO: 43; predicted 60  UPPER EXTREMITY ROM:     Active ROM Right eval Left eval  Shoulder flexion    Shoulder abduction    Shoulder adduction    Shoulder extension    Shoulder internal rotation    Shoulder external rotation    Elbow flexion    Elbow extension    Wrist flexion 29 49  Wrist extension 39 58  Wrist ulnar deviation 29 54  Wrist radial deviation 14 31  Wrist pronation 84 88   Wrist supination 64 76  (Blank rows = not tested)  Active ROM Right eval Left eval  Thumb MCP (0-60)    Thumb IP (0-80)    Thumb Radial abd/add (0-55)     Thumb Palmar abd/add (0-45)     Thumb Opposition to Small Finger     Index MCP (0-90)     Index PIP (0-100)     Index DIP (0-70)      Long MCP (0-90)      Long PIP (0-100)      Long DIP (0-70)      Ring MCP (0-90)      Ring PIP (0-100)      Ring DIP (0-70)      Little MCP (0-90)      Little PIP (0-100)      Little DIP (0-70)      (Blank rows = not tested)   Full composite fist bilaterally, slightly painful on the R with full closure   Able to oppose all digits to thumb in bilat hands, some pain with R hand digit opposition  UPPER EXTREMITY MMT:     MMT Right eval Left eval  Shoulder flexion    Shoulder abduction    Shoulder adduction    Shoulder extension    Shoulder internal rotation    Shoulder external rotation    Middle trapezius    Lower trapezius    Elbow flexion    Elbow extension    Wrist flexion 4- 4+  Wrist extension 4- 4+  Wrist ulnar deviation 4- 4+  Wrist radial deviation 4- 4+  Wrist pronation 4- 4+  Wrist supination 4- 4+  (Blank rows = not tested)  HAND FUNCTION: Grip strength: Right: 21 lbs; Left: 20 (L side hx of shoulder replacement and affected by CVA) lbs, Lateral pinch: Right: 11 lbs, Left: 8 lbs, and 3 point pinch: Right: 5 lbs, Left: 6 lbs  COORDINATION: 9 Hole Peg test: Right: 41 sec; Left: 43 sec  SENSATION: R hand WFL, L hand thumb and index finger mildly numb (but improved since carpal tunnel release)  EDEMA: very mild swelling in R dorsal wrist   COGNITION: Overall cognitive status: Within functional limits for tasks assessed  OBSERVATIONS:  Pt known to this OT from previous OT to tx  LUE weakness post CVA in 2022.  Pt eager to increase function in R hand post wrist fx as she is R hand dominant.  Pt requests 1x per week d/t pt living in [place] which requires an  extended drive to this Clinic.  Pt historically adherent to HEPs so anticipate that 1x per week should be acceptable.  TODAY'S TREATMENT:                                                                                                                              DATE: 04/18/23: Paraffin:  X10 min for R wrist/hand pain management/muscle relaxation in prep for therapeutic exercises.  Manual Therapy: -Performed soft tissue massage to R thumb, all sides of wrist and forearm with focus to radial side, working to decrease pain and increase tissue extensibility for improving joint range in the wrist and hand. -Performed grade 1 and 2 joint mobs to R wrist and forearm all planes, working to decrease pain and increase mobility to wrist and forearm joint ranges.   Therapeutic Exercise: Performed passive digit flex/ext, R thumb flex/ext, wrist ROM all planes, and forearm supination stretches, using heat around wrist and forearm throughout.   Advised on frequent AROM/PROM throughout the day in short duration vs less frequent with long duration. Reinforced benefits of heat, use of tylenol  arthritis prn (as directed by MD), and frequent stretching/ROM to manage pain/stiffness in the R wrist and hand.  Pt verbalized understanding.   PATIENT EDUCATION: Education details: Pain management strategies Person educated: Patient Education method: Explanation Education comprehension: verbalized understanding  HOME EXERCISE PROGRAM: Light blue theraputty, self passive wrist and forearm ROM, AROM  GOALS: Goals reviewed with patient? Yes  SHORT TERM GOALS: Target date: 05/03/23  Pt will be indep to perform HEP for improving R wrist flexibility and strength for daily tasks. Baseline: Eval: Initiated theraputty and passive wrist flexibility exercises at eval; min vc with handouts Goal status: INITIAL  LONG TERM GOALS: Target date: 07/04/23  Pt will increase FOTO score to 60 or better to indicate improvement in  self perceived functional use of the R hand with daily tasks. Baseline: Eval: 43 Goal status: INITIAL  2.  Pt will increase R grip strength by 10 or more lbs to enable indep with opening jars/containers.  Baseline: Eval: R grip 21 lbs (spouse assists to open jars) Goal status: INITIAL  3.  Pt will increase R wrist flex/ext/supination by 7-10* to enable sufficient mobility to engage R dominant hand during grooming and eating.   Baseline: Eval: R wrist flex 29, ext 39, sup 64 (pt reports inability to put in earrings and difficulty bringing eating utensils to mouth) Goal status: INITIAL  4.  Pt will increase R wrist strength by 1/2 or more muscle grades in order to use R dominant hand for refilling dog food and water  dishes.  Baseline: Eval: R wrist strength grossly 4-; pt uses L non-dominant hand to refill dog food and water   dishes.  Goal status: INITIAL  ASSESSMENT: CLINICAL IMPRESSION:  Pt verbalized high pain levels last night and this morning at 7-8/10 pain in the R wrist and hand, but improved with heat and tylenol  arthritis med this morning.  Pt acknowledges benefits of heat, use of tylenol  arthritis prn (as directed by MD), and frequent stretching/ROM to manage pain/stiffness in the R wrist and hand and understands pain is likely to vary d/t pt's arthritis in the hand.  Discussed possibility of increased pain and stiffness recently d/t colder temperatures outside.  Pt responded well to paraffin, moist heat during therapeutic exercises noted above, and soft tissue massage to promote increased ROM and reduced pain.  Pt reported 3-4/10 pain in R wrist and hand during session.  Pt continues to present with pain and R wrist and digit stiffness and weakness, impacting ability to efficiently use hand with ADL and IADL tasks.  Pt will continue to benefit from skilled OT to improve R wrist pain, R wrist mobility, and strength in order to increase functional use of the R dominant hand for daily tasks.    PERFORMANCE DEFICITS: in functional skills including ADLs, IADLs, coordination, sensation, edema, ROM, strength, pain, flexibility, Fine motor control, body mechanics, decreased knowledge of use of DME, and UE functional use, and psychosocial skills including coping strategies, environmental adaptation, habits, and routines and behaviors.   IMPAIRMENTS: are limiting patient from ADLs, IADLs, rest and sleep, and leisure.   COMORBIDITIES: has co-morbidities such as L sided hemiparesis, OA bilat hands  that affects occupational performance. Patient will benefit from skilled OT to address above impairments and improve overall function.  MODIFICATION OR ASSISTANCE TO COMPLETE EVALUATION: No modification of tasks or assist necessary to complete an evaluation.  OT OCCUPATIONAL PROFILE AND HISTORY: Problem focused assessment: Including review of records relating to presenting problem.  CLINICAL DECISION MAKING: Moderate - several treatment options, min-mod task modification necessary  REHAB POTENTIAL: Good  EVALUATION COMPLEXITY: Low      PLAN:  OT FREQUENCY: 1-2x/week  OT DURATION: 12 weeks  PLANNED INTERVENTIONS: 97168 OT Re-evaluation, 97535 self care/ADL training, 02889 therapeutic exercise, 97530 therapeutic activity, 97140 manual therapy, 97035 ultrasound, 97018 paraffin, 02989 moist heat, 97010 cryotherapy, 97034 contrast bath, 97033 iontophoresis, 97760 Orthotics management and training, 02239 Splinting (initial encounter), [id number] Subsequent splinting/medication, passive range of motion, psychosocial skills training, coping strategies training, patient/family education, and DME and/or AE instructions  RECOMMENDED OTHER SERVICES: None at this time  CONSULTED AND AGREED WITH PLAN OF CARE: Patient  PLAN FOR NEXT SESSION: See above   Inocente Blazing, MS, OTR/L  Inocente MARLA Blazing, OT 04/18/2023, 4:31 PM

## 2023-04-20 ENCOUNTER — Ambulatory Visit: Payer: Medicare Other

## 2023-04-25 ENCOUNTER — Telehealth: Payer: Self-pay | Admitting: *Deleted

## 2023-04-25 NOTE — Telephone Encounter (Signed)
Ortho bundle 90 day call completed. No further CM needs.

## 2023-04-26 ENCOUNTER — Ambulatory Visit: Payer: Medicare Other

## 2023-04-26 DIAGNOSIS — M25631 Stiffness of right wrist, not elsewhere classified: Secondary | ICD-10-CM | POA: Diagnosis not present

## 2023-04-26 DIAGNOSIS — M25531 Pain in right wrist: Secondary | ICD-10-CM | POA: Diagnosis not present

## 2023-04-26 DIAGNOSIS — M6281 Muscle weakness (generalized): Secondary | ICD-10-CM

## 2023-04-26 DIAGNOSIS — R278 Other lack of coordination: Secondary | ICD-10-CM | POA: Diagnosis not present

## 2023-04-26 NOTE — Therapy (Signed)
OUTPATIENT OCCUPATIONAL THERAPY ORTHO TREATMENT NOTE  Patient Name: [name] MRN: [id number] DOB:[birth year], 73 y.o., female Today's Date: 04/26/2023  PCP: Dr. Hillard Danker REFERRING PROVIDER: Dr. Rise Paganini  END OF SESSION:  OT End of Session - 04/26/23 1455     Visit Number 6    Number of Visits 12    Date for OT Re-Evaluation 06/14/23    Progress Note Due on Visit 10    OT Start Time 1450    OT Stop Time 1535    OT Time Calculation (min) 45 min    Activity Tolerance Patient tolerated treatment well    Behavior During Therapy WFL for tasks assessed/performed            Past Medical History:  Diagnosis Date   Arthritis    back, fingers with joint pain and swelling.  chronic back pain   Cataract    Chronic back pain    arthritis    Diverticulosis    Elevated cholesterol    takes Niacin daily and Simvastatin   GERD (gastroesophageal reflux disease) 06/2004   non-specific gastritis on EGD 06/2004   Headache(784.0)    occasionally   History of colon polyps 2004, 2009   2004:adenomatous. 2009 hyperplastic.    History of hiatal hernia    Hypertension    takes Tenoretic and Lisinopril daily   Mucoid cyst of joint 08/2013   right index finger   Neuromuscular disorder (HCC)    numbness in right leg after knee replacement   Osteopenia 01/2014   T score -1.1 FRAX 14%/0.5%. Stable from prior DEXA   PONV (postoperative nausea and vomiting)    Rotator cuff arthropathy    Left   Seasonal allergies    Sleep apnea    wears CPAP nightly   Stroke (HCC) 1998   x 2 - mild left-sided weakness   Urge incontinence    Uterine prolapse    Past Surgical History:  Procedure Laterality Date   BACK SURGERY     x 2   BELPHAROPTOSIS REPAIR Bilateral 12/14/2017   BUBBLE STUDY  02/16/2021   Procedure: BUBBLE STUDY;  Surgeon: Vesta Mixer, MD;  Location: [facility] ENDOSCOPY;  Service: Cardiovascular;;   CARPAL TUNNEL RELEASE Bilateral    Dr August Saucer   CATARACT EXTRACTION      CATARACT EXTRACTION Bilateral 10/2017   COLONOSCOPY  2004, 2009, 2014   FINGER SURGERY     GUM SURGERY     GYNECOLOGIC CRYOSURGERY     HYSTEROSCOPY WITH D & C  12/07/2010   with resection of endometrial polyp   JOINT REPLACEMENT     KNEE ARTHROSCOPY Left 2008   lip biopsy     done at MD office Fri 11/22/13   MASS EXCISION Right 08/15/2013   Procedure: RIGHT INDEX EXCISION MASS ;  Surgeon: Tami Ribas, MD;  Location: [facility];  Service: Orthopedics;  Laterality: Right;   NASAL SEPTUM SURGERY     OOPHORECTOMY Right 2000   REVERSE SHOULDER ARTHROPLASTY Left 12/11/2018   REVERSE SHOULDER ARTHROPLASTY Left 12/11/2018   Procedure: LEFT REVERSE SHOULDER ARTHROPLASTY;  Surgeon: Cammy Copa, MD;  Location: [facility] OR;  Service: Orthopedics;  Laterality: Left;   SHOULDER ARTHROSCOPY WITH OPEN ROTATOR CUFF REPAIR AND DISTAL CLAVICLE ACROMINECTOMY Right 11/26/2013   Procedure: RIGHT SHOULDER ARTHROSCOPY WITH MINI OPEN ROTATOR CUFF REPAIR AND DISTAL CLAVICLE RESECTION, SUBACROMIAL DECOMPRESSION, POSSIBLE [facility] PATCH.;  Surgeon: Valeria Batman, MD;  Location: [facility] OR;  Service: Orthopedics;  Laterality: Right;   TEE WITHOUT CARDIOVERSION N/A 02/16/2021   Procedure: TRANSESOPHAGEAL ECHOCARDIOGRAM (TEE);  Surgeon: Elease Hashimoto Deloris Ping, MD;  Location: [facility] ENDOSCOPY;  Service: Cardiovascular;  Laterality: N/A;   TOTAL KNEE ARTHROPLASTY Right 03/21/2017   TOTAL KNEE ARTHROPLASTY Right 03/21/2017   Procedure: RIGHT TOTAL KNEE ARTHROPLASTY;  Surgeon: Valeria Batman, MD;  Location: [facility] OR;  Service: Orthopedics;  Laterality: Right;   TOTAL KNEE ARTHROPLASTY Left 11/29/2022   Procedure: LEFT TOTAL KNEE ARTHROPLASTY;  Surgeon: Cammy Copa, MD;  Location: [facility] ORS;  Service: Orthopedics;  Laterality: Left;   TUBAL LIGATION     Patient Active Problem List   Diagnosis Date Noted   Unintentional weight loss 03/24/2023   S/P total knee replacement, left 11/29/2022   Urinary  incontinence 03/25/2022   ICH (intracerebral hemorrhage) (HCC) 02/13/2021   Iron deficiency 10/07/2020   Aortic atherosclerosis (HCC) 03/17/2020   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   Chronic left shoulder pain 08/23/2018   Arthritis of left knee 11/02/2016   Chronic diarrhea 12/07/2014   Segmental colitis with rectal bleeding (HCC) 09/12/2014   Routine general medical examination at a health care facility 06/05/2014   Overweight 06/13/2011   Hyperlipidemia 02/17/2007   Essential hypertension 01/08/2007   Osteoarthritis 01/08/2007   ONSET DATE: 11/30/22  REFERRING DIAG: Pain in R wrist; closed intra-articular fracture of distal end of right radius  THERAPY DIAG:  Muscle weakness (generalized)  Other lack of coordination  Pain in right wrist  Stiffness of right wrist joint  Rationale for Evaluation and Treatment: Rehabilitation  SUBJECTIVE:  SUBJECTIVE STATEMENT: Pt reports increased pain today but that she's been using her R hand more. Pt accompanied by: self  PERTINENT HISTORY:  Per MEDICAL RECORD NUMBERNancy B Haley is a 73 y.o. female who presents to the office reporting decreasing pain from right wrist fracture.  She has seen another hand surgeon for evaluation of the right wrist and they have recommended occupational therapy which has been arranged.   After 2 negative xrays, MRI confirmed: Other closed intra-articular fracture of distal end of right radius, initial encounter (Primary Dx);  Additional PMH of: Primary osteoarthritis of right wrist;  Primary osteoarthritis of right hand  Carpal tunnel release B hands ~1.5 years ago Hx of CVA, treated in outpatient OT by this clinician  PRECAUTIONS: Other: ok to wear the wrist brace for comfort as needed  RED FLAGS: None   WEIGHT BEARING RESTRICTIONS: No  PAIN: 04/26/23: 6/10 R wrist ; has used the R hand a lot today  Are you having pain? Yes: NPRS scale: 3 at rest, with activity 6/10 Pain location: dorsal  wrist and hand, radial aspect Pain description: tight and a little sharp, a little achy Aggravating factors: weight bearing through the hand and movement Relieving factors: rest, Voltaren gel may help somewhat, OTC pain meds  FALLS: Has patient fallen in last 6 months? Yes. Number of falls 1 fall (cause of injury)  LIVING ENVIRONMENT: Lives with: lives with their spouse Lives in: 3 level home (stair lift to bonus room), pt gets on her gator (ATV) and goes around the house to access the basement Stairs: Yes: External: 2 steps; on right going up Has following equipment at home: Quad cane small base, shower chair, elevated toilet seat, grab bar at the toilet   PLOF: Independent  PATIENT GOALS: "To be able to use my R hand for my daily activities.  I'm R handed so I want to be as good as I  can."   NEXT MD VISIT: Posey Rea Going to have an MRI for low back d/t worsening numbness in the R Leg.    OBJECTIVE:  Note: Objective measures were completed at Evaluation unless otherwise noted.  HAND DOMINANCE: Right  ADLs: Overall ADLs: modified indep with basic self care prior to injury.  Spouse currently assisting with opening bottles.  Pt endorses difficulty washing under L arm pit d/t lack of wrist mobility, spouse assists with lifting, carrying heavier items in kitchen.  Difficulty with tasks that require bending, pulling, or pushing from the wrist.  Difficulty putting in earrings.   FUNCTIONAL OUTCOME MEASURES: FOTO: 43; predicted 60  UPPER EXTREMITY ROM:     Active ROM Right eval Left eval  Shoulder flexion    Shoulder abduction    Shoulder adduction    Shoulder extension    Shoulder internal rotation    Shoulder external rotation    Elbow flexion    Elbow extension    Wrist flexion 29 49  Wrist extension 39 58  Wrist ulnar deviation 29 54  Wrist radial deviation 14 31  Wrist pronation 84 88  Wrist supination 64 76  (Blank rows = not tested)  Active ROM Right eval Left eval   Thumb MCP (0-60)    Thumb IP (0-80)    Thumb Radial abd/add (0-55)     Thumb Palmar abd/add (0-45)     Thumb Opposition to Small Finger     Index MCP (0-90)     Index PIP (0-100)     Index DIP (0-70)      Long MCP (0-90)      Long PIP (0-100)      Long DIP (0-70)      Ring MCP (0-90)      Ring PIP (0-100)      Ring DIP (0-70)      Little MCP (0-90)      Little PIP (0-100)      Little DIP (0-70)      (Blank rows = not tested)   Full composite fist bilaterally, slightly painful on the R with full closure   Able to oppose all digits to thumb in bilat hands, some pain with R hand digit opposition  UPPER EXTREMITY MMT:     MMT Right eval Left eval  Shoulder flexion    Shoulder abduction    Shoulder adduction    Shoulder extension    Shoulder internal rotation    Shoulder external rotation    Middle trapezius    Lower trapezius    Elbow flexion    Elbow extension    Wrist flexion 4- 4+  Wrist extension 4- 4+  Wrist ulnar deviation 4- 4+  Wrist radial deviation 4- 4+  Wrist pronation 4- 4+  Wrist supination 4- 4+  (Blank rows = not tested)  HAND FUNCTION: Grip strength: Right: 21 lbs; Left: 20 (L side hx of shoulder replacement and affected by CVA) lbs, Lateral pinch: Right: 11 lbs, Left: 8 lbs, and 3 point pinch: Right: 5 lbs, Left: 6 lbs  COORDINATION: 9 Hole Peg test: Right: 41 sec; Left: 43 sec  SENSATION: R hand WFL, L hand thumb and index finger mildly numb (but improved since carpal tunnel release)  EDEMA: very mild swelling in R dorsal wrist   COGNITION: Overall cognitive status: Within functional limits for tasks assessed  OBSERVATIONS:  Pt known to this OT from previous OT to tx LUE weakness post CVA in 2022.  Pt eager to  increase function in R hand post wrist fx as she is R hand dominant.  Pt requests 1x per week d/t pt living in [place] which requires an extended drive to this Clinic.  Pt historically adherent to HEPs so anticipate that 1x per week  should be acceptable.  TODAY'S TREATMENT:                                                                                                                              DATE: 04/26/23: Paraffin:  X10 min for R wrist/hand pain management/muscle relaxation in prep for therapeutic exercises.  Manual Therapy: -Performed soft tissue massage to R thumb, all sides of wrist and forearm with focus to radial side, working to decrease pain and increase tissue extensibility for improving joint range in the wrist and hand. -Performed grade 1 and 2 joint mobs to R wrist and forearm all planes, working to decrease pain and increase mobility to wrist and forearm joint ranges.   Therapeutic Exercise: Performed passive digit flex/ext, R thumb flex/ext, wrist ROM all planes, and forearm supination stretches, using heat around wrist and forearm throughout.   Reinforced benefits of heat, use of tylenol arthritis prn (as directed by MD), and frequent stretching/ROM to manage pain/stiffness in the R wrist and hand.  Pt verbalized understanding.   PATIENT EDUCATION: Education details: Pain management strategies Person educated: Patient Education method: Explanation Education comprehension: verbalized understanding  HOME EXERCISE PROGRAM: Light blue theraputty, self passive wrist and forearm ROM, AROM  GOALS: Goals reviewed with patient? Yes  SHORT TERM GOALS: Target date: 05/03/23  Pt will be indep to perform HEP for improving R wrist flexibility and strength for daily tasks. Baseline: Eval: Initiated theraputty and passive wrist flexibility exercises at eval; min vc with handouts Goal status: INITIAL  LONG TERM GOALS: Target date: 07/04/23  Pt will increase FOTO score to 60 or better to indicate improvement in self perceived functional use of the R hand with daily tasks. Baseline: Eval: 43 Goal status: INITIAL  2.  Pt will increase R grip strength by 10 or more lbs to enable indep with opening  jars/containers.  Baseline: Eval: R grip 21 lbs (spouse assists to open jars) Goal status: INITIAL  3.  Pt will increase R wrist flex/ext/supination by 7-10* to enable sufficient mobility to engage R dominant hand during grooming and eating.   Baseline: Eval: R wrist flex 29, ext 39, sup 64 (pt reports inability to put in earrings and difficulty bringing eating utensils to mouth) Goal status: INITIAL  4.  Pt will increase R wrist strength by 1/2 or more muscle grades in order to use R dominant hand for refilling dog food and water dishes.  Baseline: Eval: R wrist strength grossly 4-; pt uses L non-dominant hand to refill dog food and water dishes.  Goal status: INITIAL  ASSESSMENT: CLINICAL IMPRESSION:  Pt reporting increased pain today, but that she's been using her R hand a lot, including  changing the bed sheets by herself, which she hasn't been able to do independently since before her injury.  Pt reports spouse did have to help her with stirring a batch of chicken salad last night that she had made d/t pain and weakness in the R hand.  Pt continues to respond well to paraffin, moist heat during therapeutic exercises noted above, and soft tissue massage to promote increased ROM and reduced pain.  Wrist flexibility is improving, noting R wrist flexion at 55*, ext 60*, and supination 72*.  Pt continues to present with pain and R wrist and digit stiffness and weakness, impacting ability to efficiently use hand with ADL and IADL tasks.  Pt will continue to benefit from skilled OT to improve R wrist pain, R wrist mobility, and strength in order to increase functional use of the R dominant hand for daily tasks.   PERFORMANCE DEFICITS: in functional skills including ADLs, IADLs, coordination, sensation, edema, ROM, strength, pain, flexibility, Fine motor control, body mechanics, decreased knowledge of use of DME, and UE functional use, and psychosocial skills including coping strategies, environmental  adaptation, habits, and routines and behaviors.   IMPAIRMENTS: are limiting patient from ADLs, IADLs, rest and sleep, and leisure.   COMORBIDITIES: has co-morbidities such as L sided hemiparesis, OA bilat hands  that affects occupational performance. Patient will benefit from skilled OT to address above impairments and improve overall function.  MODIFICATION OR ASSISTANCE TO COMPLETE EVALUATION: No modification of tasks or assist necessary to complete an evaluation.  OT OCCUPATIONAL PROFILE AND HISTORY: Problem focused assessment: Including review of records relating to presenting problem.  CLINICAL DECISION MAKING: Moderate - several treatment options, min-mod task modification necessary  REHAB POTENTIAL: Good  EVALUATION COMPLEXITY: Low      PLAN:  OT FREQUENCY: 1-2x/week  OT DURATION: 12 weeks  PLANNED INTERVENTIONS: 97168 OT Re-evaluation, 97535 self care/ADL training, 16109 therapeutic exercise, 97530 therapeutic activity, 97140 manual therapy, 97035 ultrasound, 97018 paraffin, 60454 moist heat, 97010 cryotherapy, 97034 contrast bath, 97033 iontophoresis, 97760 Orthotics management and training, 09811 Splinting (initial encounter), [id number] Subsequent splinting/medication, passive range of motion, psychosocial skills training, coping strategies training, patient/family education, and DME and/or AE instructions  RECOMMENDED OTHER SERVICES: None at this time  CONSULTED AND AGREED WITH PLAN OF CARE: Patient  PLAN FOR NEXT SESSION: See above   Danelle Earthly, MS, OTR/L  Otis Dials, OT 04/26/2023, 2:55 PM

## 2023-05-01 ENCOUNTER — Encounter: Payer: Self-pay | Admitting: Oncology

## 2023-05-01 ENCOUNTER — Inpatient Hospital Stay: Payer: Medicare Other | Attending: Oncology | Admitting: Oncology

## 2023-05-01 ENCOUNTER — Inpatient Hospital Stay: Payer: Medicare Other

## 2023-05-01 VITALS — BP 108/74 | HR 72 | Temp 98.8°F | Resp 18 | Ht 62.0 in | Wt 158.0 lb

## 2023-05-01 DIAGNOSIS — R5383 Other fatigue: Secondary | ICD-10-CM | POA: Insufficient documentation

## 2023-05-01 DIAGNOSIS — D509 Iron deficiency anemia, unspecified: Secondary | ICD-10-CM | POA: Diagnosis not present

## 2023-05-01 DIAGNOSIS — Z79899 Other long term (current) drug therapy: Secondary | ICD-10-CM | POA: Diagnosis not present

## 2023-05-01 DIAGNOSIS — I10 Essential (primary) hypertension: Secondary | ICD-10-CM | POA: Diagnosis not present

## 2023-05-01 NOTE — Progress Notes (Signed)
Hematology/Oncology Consult note [facility] Telephone:([phone] [phone]  Patient Care Team: Myrlene Broker, MD as PCP - General (Internal Medicine) Jodelle Red, MD as PCP - Cardiology (Cardiology) [facility], [facility], [place]   Name of the patient: [name]  [id number]  Sep 24, 1950    Reason for referral-iron deficiency anemia   Referring physician-Elizabeth Okey Dupre, MD  Date of visit: 05/01/23   History of presenting illness- Patient is a 73 year old female with a past medical history significant for hypertension hyperlipidemia osteoarthritis who has been referred for iron deficiency anemia.  CBC from 03/24/2023 showed white count of 8.3, H&H of 10.9/25.7 with an MCV of 73 B12 level is normal at 837.  TSH normal.  Iron studies from January 2025 showed low ferritin of 14 with an iron saturation of 6% and elevated TIBC.  She underwent left knee surgery in August 2024.  At baseline her hemoglobin usually runs between 12.5-14.  She has had evidence of chronic iron deficiency anemia even dating back to 3 years ago.  Colonoscopy was done in November 2021 by Dr. Russella Dar which showed diverticulosis but no evidence of bleeding.  Random biopsies were negative for malignancy.  EGD showed mild nonobstructive stenosis at the GE junction which was traversed.  Small hiatal hernia.  Biopsies were also taken for celiac disease which were negative.  She was found to have lymphocytic colitis for which she was prescribed oral steroids.  Presently patient reports changes in her taste sensation and significant fatigue.  She denies any blood loss in her stool or urine.  Denies any nosebleeds gum bleeds or vaginal bleeding.  ECOG PS- 1  Pain scale- 0   Review of systems- Review of Systems  Constitutional:  Positive for malaise/fatigue. Negative for chills, fever and weight loss.  HENT:  Negative for congestion, ear discharge and  nosebleeds.   Eyes:  Negative for blurred vision.  Respiratory:  Negative for cough, hemoptysis, sputum production, shortness of breath and wheezing.   Cardiovascular:  Negative for chest pain, palpitations, orthopnea and claudication.  Gastrointestinal:  Negative for abdominal pain, blood in stool, constipation, diarrhea, heartburn, melena, nausea and vomiting.  Genitourinary:  Negative for dysuria, flank pain, frequency, hematuria and urgency.  Musculoskeletal:  Negative for back pain, joint pain and myalgias.  Skin:  Negative for rash.  Neurological:  Negative for dizziness, tingling, focal weakness, seizures, weakness and headaches.  Endo/Heme/Allergies:  Does not bruise/bleed easily.  Psychiatric/Behavioral:  Negative for depression and suicidal ideas. The patient does not have insomnia.     Allergies  Allergen Reactions   Egg-Derived Products Other (See Comments)    GI UPSET, JOINT PAIN, DIFF. SWALLOWING   Etodolac Other (See Comments)    ABD. CRAMPING   Fish-Derived Products Other (See Comments)    GI UPSET, JOINT PAIN, DIFF. SWALLOWING    Omeprazole Other (See Comments)    ABD. CRAMPING   Pineapple Other (See Comments)    GI UPSET, JOINT PAIN, DIFF. SWALLOWING   Gluten Meal Diarrhea    GI upset    Patient Active Problem List   Diagnosis Date Noted   Unintentional weight loss 03/24/2023   S/P total knee replacement, left 11/29/2022   Urinary incontinence 03/25/2022   ICH (intracerebral hemorrhage) (HCC) 02/13/2021   Iron deficiency 10/07/2020   Aortic atherosclerosis (HCC) 03/17/2020   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   Chronic left shoulder pain 08/23/2018   Arthritis of left knee 11/02/2016   Chronic diarrhea 12/07/2014  Segmental colitis with rectal bleeding (HCC) 09/12/2014   Routine general medical examination at a health care facility 06/05/2014   Overweight 06/13/2011   Hyperlipidemia 02/17/2007   Essential hypertension 01/08/2007    Osteoarthritis 01/08/2007     Past Medical History:  Diagnosis Date   Arthritis    back, fingers with joint pain and swelling.  chronic back pain   Cataract    Chronic back pain    arthritis    Diverticulosis    Elevated cholesterol    takes Niacin daily and Simvastatin   GERD (gastroesophageal reflux disease) 06/2004   non-specific gastritis on EGD 06/2004   Headache(784.0)    occasionally   History of colon polyps 2004, 2009   2004:adenomatous. 2009 hyperplastic.    History of hiatal hernia    Hypertension    takes Tenoretic and Lisinopril daily   Mucoid cyst of joint 08/2013   right index finger   Neuromuscular disorder (HCC)    numbness in right leg after knee replacement   Osteopenia 01/2014   T score -1.1 FRAX 14%/0.5%. Stable from prior DEXA   PONV (postoperative nausea and vomiting)    Rotator cuff arthropathy    Left   Seasonal allergies    Sleep apnea    wears CPAP nightly   Stroke (HCC) 1998   x 2 - mild left-sided weakness   Urge incontinence    Uterine prolapse      Past Surgical History:  Procedure Laterality Date   BACK SURGERY     x 2   BELPHAROPTOSIS REPAIR Bilateral 12/14/2017   BUBBLE STUDY  02/16/2021   Procedure: BUBBLE STUDY;  Surgeon: Vesta Mixer, MD;  Location: [facility] ENDOSCOPY;  Service: Cardiovascular;;   CARPAL TUNNEL RELEASE Bilateral    Dr August Saucer   CATARACT EXTRACTION     CATARACT EXTRACTION Bilateral 10/2017   COLONOSCOPY  2004, 2009, 2014   FINGER SURGERY     GUM SURGERY     GYNECOLOGIC CRYOSURGERY     HYSTEROSCOPY WITH D & C  12/07/2010   with resection of endometrial polyp   JOINT REPLACEMENT     KNEE ARTHROSCOPY Left 2008   lip biopsy     done at MD office Fri 11/22/13   MASS EXCISION Right 08/15/2013   Procedure: RIGHT INDEX EXCISION MASS ;  Surgeon: Tami Ribas, MD;  Location: [facility];  Service: Orthopedics;  Laterality: Right;   NASAL SEPTUM SURGERY     OOPHORECTOMY Right 2000   REVERSE SHOULDER  ARTHROPLASTY Left 12/11/2018   REVERSE SHOULDER ARTHROPLASTY Left 12/11/2018   Procedure: LEFT REVERSE SHOULDER ARTHROPLASTY;  Surgeon: Cammy Copa, MD;  Location: [facility] OR;  Service: Orthopedics;  Laterality: Left;   SHOULDER ARTHROSCOPY WITH OPEN ROTATOR CUFF REPAIR AND DISTAL CLAVICLE ACROMINECTOMY Right 11/26/2013   Procedure: RIGHT SHOULDER ARTHROSCOPY WITH MINI OPEN ROTATOR CUFF REPAIR AND DISTAL CLAVICLE RESECTION, SUBACROMIAL DECOMPRESSION, POSSIBLE [facility] PATCH.;  Surgeon: Valeria Batman, MD;  Location: [facility] OR;  Service: Orthopedics;  Laterality: Right;   TEE WITHOUT CARDIOVERSION N/A 02/16/2021   Procedure: TRANSESOPHAGEAL ECHOCARDIOGRAM (TEE);  Surgeon: Elease Hashimoto Deloris Ping, MD;  Location: [facility] ENDOSCOPY;  Service: Cardiovascular;  Laterality: N/A;   TOTAL KNEE ARTHROPLASTY Right 03/21/2017   TOTAL KNEE ARTHROPLASTY Right 03/21/2017   Procedure: RIGHT TOTAL KNEE ARTHROPLASTY;  Surgeon: Valeria Batman, MD;  Location: [facility] OR;  Service: Orthopedics;  Laterality: Right;   TOTAL KNEE ARTHROPLASTY Left 11/29/2022   Procedure: LEFT TOTAL KNEE ARTHROPLASTY;  Surgeon: Cammy Copa, MD;  Location: [facility] ORS;  Service: Orthopedics;  Laterality: Left;   TUBAL LIGATION      Social History   Socioeconomic History   Marital status: Married    Spouse name: Not on file   Number of children: 2   Years of education: 12   Highest education level: Not on file  Occupational History   Occupation: Forensic scientist - Human Resources-Retired    Comment: Disability/Retired  Tobacco Use   Smoking status: Never    Passive exposure: Yes   Smokeless tobacco: Never  Vaping Use   Vaping status: Never Used  Substance and Sexual Activity   Alcohol use: Not Currently    Alcohol/week: 0.0 standard drinks of alcohol    Comment: about 2-3 times a year   Drug use: No   Sexual activity: Yes    Partners: Male    Birth control/protection: Surgical, Post-menopausal    Comment: BTL-1st intercourse 73  yo-More than 5 partners  Other Topics Concern   Not on file  Social History Narrative   Aneisa grew up in [place], [place]. She lives with her husband Karen Kitchens). She has 1 daughter Efraim Kaufmann), 1 son Lenard Galloway) and 2 stepchildren (Benny & Gilda Crease). They have 3 dogs ([name], [place], [address]) and 3 cats ([place], [place], [place]). She enjoys watching movies and outdoors activities.Exercise - noneCaffeine - varies depending on the day. May have between 1-4 cups daily.  Rare Soda.    Social Drivers of Corporate investment banker Strain: Low Risk  (04/04/2023)   Overall Financial Resource Strain (CARDIA)    Difficulty of Paying Living Expenses: Not hard at all  Food Insecurity: No Food Insecurity (04/04/2023)   Hunger Vital Sign    Worried About Running Out of Food in the Last Year: Never true    Ran Out of Food in the Last Year: Never true  Transportation Needs: No Transportation Needs (04/04/2023)   PRAPARE - Administrator, Civil Service (Medical): No    Lack of Transportation (Non-Medical): No  Physical Activity: Inactive (04/04/2023)   Exercise Vital Sign    Days of Exercise per Week: 0 days    Minutes of Exercise per Session: 0 min  Stress: No Stress Concern Present (04/04/2023)   [employer] of Occupational Health - Occupational Stress Questionnaire    Feeling of Stress : Not at all  Social Connections: Moderately Isolated (04/04/2023)   Social Connection and Isolation Panel [NHANES]    Frequency of Communication with Friends and Family: More than three times a week    Frequency of Social Gatherings with Friends and Family: Three times a week    Attends Religious Services: Never    Active Member of Clubs or Organizations: No    Attends Banker Meetings: Never    Marital Status: Married  Catering manager Violence: Not At Risk (04/04/2023)   Humiliation, Afraid, Rape, and Kick questionnaire    Fear of Current or Ex-Partner: No    Emotionally Abused: No     Physically Abused: No    Sexually Abused: No     Family History  Problem Relation Age of Onset   Hypertension Mother    Heart disease Mother    Diabetes Father    Hypertension Father    Heart disease Father    Stroke Father    Diabetes Maternal Aunt    Diabetes Paternal Grandmother    Hypertension Paternal Grandfather    Stroke Paternal Grandfather  Colon cancer Neg Hx    Esophageal cancer Neg Hx    Rectal cancer Neg Hx    Stomach cancer Neg Hx    Sleep apnea Neg Hx      Current Outpatient Medications:    acetaminophen (TYLENOL) 500 MG tablet, Take 1,000-1,500 mg by mouth every 8 (eight) hours as needed for moderate pain., Disp: , Rfl:    ALPHA LIPOIC ACID PO, Take 1,400 mg by mouth daily., Disp: , Rfl:    amoxicillin (AMOXIL) 500 MG capsule, 2g 1 hour prior to dental procedure, Disp: 10 capsule, Rfl: 0   Ascorbic Acid (VITAMIN C) 1000 MG tablet, Take 1,000 mg by mouth daily. With zinc, Disp: , Rfl:    aspirin 81 MG chewable tablet, Chew 1 tablet (81 mg total) by mouth 2 (two) times daily., Disp: 60 tablet, Rfl: 0   atenolol-chlorthalidone (TENORETIC) 50-25 MG tablet, Take 1 tablet by mouth daily., Disp: 90 tablet, Rfl: 1   b complex vitamins tablet, Take 1 tablet by mouth at bedtime., Disp: , Rfl:    budesonide (ENTOCORT EC) 3 MG 24 hr capsule, TAKE 3 CAPSULES (9 MG TOTAL) BY MOUTH DAILY. (Patient not taking: Reported on 05/01/2023), Disp: , Rfl:    calcium carbonate (OSCAL) 1500 (600 Ca) MG TABS tablet, Take 600 mg of elemental calcium by mouth daily., Disp: , Rfl:    Cholecalciferol (D 5000) 5000 units capsule, Take 5,000 Units by mouth daily., Disp: , Rfl:    Coenzyme Q10 (COQ10) 100 MG CAPS, Take 100 mg by mouth every evening. , Disp: , Rfl:    diclofenac Sodium (VOLTAREN) 1 % GEL, Apply 1 Application topically 2 (two) times daily., Disp: , Rfl:    diphenoxylate-atropine (LOMOTIL) 2.5-0.025 MG tablet, TAKE 1 TABLET BY MOUTH FOUR TIMES A DAY AS NEEDED FOR DIARRHEA OR LOOSE  STOOLS (Patient not taking: Reported on 05/01/2023), Disp: 80 tablet, Rfl: 2   ELDERBERRY PO, Take 250 mg by mouth daily., Disp: , Rfl:    folic acid (FOLVITE) 800 MCG tablet, Take 800 mcg by mouth every evening. , Disp: , Rfl:    Glutathione 500 MG CAPS, Take 500 mg by mouth daily., Disp: , Rfl:    ketotifen (ZADITOR) 0.025 % ophthalmic solution, Place 2 drops into both eyes 2 (two) times daily as needed (allergies)., Disp: , Rfl:    KLOR-CON M20 20 MEQ tablet, TAKE 1 TABLET TWICE A DAY, Disp: 180 tablet, Rfl: 1   loperamide (IMODIUM A-D) 2 MG tablet, Take 2-4 mg by mouth 4 (four) times daily as needed for diarrhea or loose stools. (Patient not taking: Reported on 05/01/2023), Disp: , Rfl:    Melatonin 3 MG TABS, Take 3 mg by mouth at bedtime. , Disp: , Rfl:    METAMUCIL FIBER PO, Take 2 tablets by mouth daily as needed (constipation). (Patient not taking: Reported on 05/01/2023), Disp: , Rfl:    methocarbamol (ROBAXIN) 500 MG tablet, TAKE 1 TABLET BY MOUTH EVERY 8 HOURS AS NEEDED FOR MUSCLE SPASMS, Disp: 90 tablet, Rfl: 3   Multiple Vitamin (MULTIVITAMIN WITH MINERALS) TABS tablet, Take 1 tablet by mouth at bedtime., Disp: , Rfl:    Multiple Vitamins-Minerals (ZINC PO), Take 1 Dose by mouth daily. 1 dose = 1 dropper full of liquid zinc, Disp: , Rfl:    OVER THE COUNTER MEDICATION, Take 1 Dose by mouth at bedtime. Lemon Balm, Disp: , Rfl:    OVER THE COUNTER MEDICATION, Take 1 Dose by mouth at bedtime. Cats Claw  liquid daily, Disp: , Rfl:    OVER THE COUNTER MEDICATION, Apply 1 Application topically at bedtime. Magnesium lotion (Patient not taking: Reported on 05/01/2023), Disp: , Rfl:    saccharomyces boulardii (FLORASTOR) 250 MG capsule, Take 250 mg by mouth daily., Disp: , Rfl:    simvastatin (ZOCOR) 20 MG tablet, Take 1 tablet (20 mg total) by mouth daily. (Patient not taking: Reported on 05/01/2023), Disp: 90 tablet, Rfl: 1   Sodium Fluoride (SODIUM FLUORIDE 5000 PPM) 1.1 % PSTE, Place 1  application onto teeth at bedtime. (Patient not taking: Reported on 05/01/2023), Disp: , Rfl:    traZODone (DESYREL) 50 MG tablet, Take 1 tablet (50 mg total) by mouth at bedtime. (Patient not taking: Reported on 05/01/2023), Disp: 25 tablet, Rfl: 0   TURMERIC CURCUMIN PO, Take 630 mg by mouth daily., Disp: , Rfl:    Physical exam: There were no vitals filed for this visit. Physical Exam Cardiovascular:     Rate and Rhythm: Normal rate and regular rhythm.     Heart sounds: Normal heart sounds.  Pulmonary:     Effort: Pulmonary effort is normal.     Breath sounds: Normal breath sounds.  Abdominal:     General: Bowel sounds are normal.     Palpations: Abdomen is soft.  Skin:    General: Skin is warm and dry.  Neurological:     Mental Status: She is alert and oriented to person, place, and time.           Latest Ref Rng & Units 11/11/2022    1:30 PM  CMP  Glucose 70 - 99 mg/dL 93   BUN 8 - 23 mg/dL 20   Creatinine 8.29 - 1.00 mg/dL 5.62   Sodium 130 - 865 mmol/L 139   Potassium 3.5 - 5.1 mmol/L 3.6   Chloride 98 - 111 mmol/L 101   CO2 22 - 32 mmol/L 26   Calcium 8.9 - 10.3 mg/dL 9.6       Latest Ref Rng & Units 03/24/2023   11:24 AM  CBC  WBC 4.0 - 10.5 K/uL 8.3   Hemoglobin 12.0 - 15.0 g/dL 78.4   Hematocrit 69.6 - 46.0 % 35.7   Platelets 150.0 - 400.0 K/uL 305.0    Assessment and plan- Patient is a 73 y.o. female referred for iron deficient anemia  Patient had EGD and colonoscopy in 2021 which did not show any evidence of bleeding or malignancy.  Iron deficiency anemia has been chronic over the last 2 years.  We discussed proceeding with IV iron at this time.  She will receive 5 mg.  Discussed risks and benefits of IV iron including all but not limited to possible risk of infusion reaction.  Patient understands and agrees to proceed as planned.  We will hold off on GI referral at this time but of iron deficiency anemia recurs I will refer her to GI at that time.  CBC  ferritin and iron studies in 3 months and I will see her thereafter   Thank you for this kind referral and the opportunity to participate in the care of this  Patient   Visit Diagnosis 1. Microcytic anemia     Dr. Owens Shark, MD, MPH [facility] at [facility] [phone] 05/01/2023

## 2023-05-01 NOTE — Addendum Note (Signed)
Addended by: Suzan Slick on: 05/01/2023 04:06 PM   Modules accepted: Orders

## 2023-05-02 ENCOUNTER — Other Ambulatory Visit: Payer: Self-pay

## 2023-05-02 ENCOUNTER — Ambulatory Visit (INDEPENDENT_AMBULATORY_CARE_PROVIDER_SITE_OTHER): Payer: Medicare Other | Admitting: Physical Medicine and Rehabilitation

## 2023-05-02 DIAGNOSIS — M5416 Radiculopathy, lumbar region: Secondary | ICD-10-CM | POA: Diagnosis not present

## 2023-05-02 MED ORDER — METHYLPREDNISOLONE ACETATE 40 MG/ML IJ SUSP
40.0000 mg | Freq: Once | INTRAMUSCULAR | Status: AC
  Administered 2023-05-02: 40 mg

## 2023-05-02 NOTE — Patient Instructions (Signed)

## 2023-05-02 NOTE — Progress Notes (Signed)
Functional Pain Scale - descriptive words and definitions  Uncomfortable (3)  Pain is present but can complete all ADL's/sleep is slightly affected and passive distraction only gives marginal relief. Mild range order  Average Pain 4  She has numbness in her R foot.   +Driver, -BT, -Dye Allergies.

## 2023-05-03 ENCOUNTER — Inpatient Hospital Stay: Payer: Medicare Other

## 2023-05-03 VITALS — BP 108/68 | HR 67 | Temp 96.5°F | Resp 18

## 2023-05-03 DIAGNOSIS — D509 Iron deficiency anemia, unspecified: Secondary | ICD-10-CM | POA: Diagnosis not present

## 2023-05-03 DIAGNOSIS — E611 Iron deficiency: Secondary | ICD-10-CM

## 2023-05-03 DIAGNOSIS — R5383 Other fatigue: Secondary | ICD-10-CM | POA: Diagnosis not present

## 2023-05-03 DIAGNOSIS — I10 Essential (primary) hypertension: Secondary | ICD-10-CM | POA: Diagnosis not present

## 2023-05-03 DIAGNOSIS — Z79899 Other long term (current) drug therapy: Secondary | ICD-10-CM | POA: Diagnosis not present

## 2023-05-03 MED ORDER — IRON SUCROSE 20 MG/ML IV SOLN
200.0000 mg | INTRAVENOUS | Status: DC
  Administered 2023-05-03: 200 mg via INTRAVENOUS

## 2023-05-03 MED ORDER — ACETAMINOPHEN 325 MG PO TABS
650.0000 mg | ORAL_TABLET | Freq: Once | ORAL | Status: AC
  Administered 2023-05-03: 650 mg via ORAL

## 2023-05-03 MED ORDER — SODIUM CHLORIDE 0.9% FLUSH
10.0000 mL | Freq: Once | INTRAVENOUS | Status: AC | PRN
  Administered 2023-05-03: 10 mL
  Filled 2023-05-03: qty 10

## 2023-05-03 NOTE — Progress Notes (Signed)
Patient here for first venofer infusion today. Venofer was administered at 1306. About 30 minutes after venofer, pt states she has a headache rated 4.5/10 and "neck stiffness". Smith Robert, MD notified. Per Smith Robert, MD pt was given tylenol at 1357. Pt educated to notify us with any questions or concerns. AVS was discussed with pt regarding side effects of venofer and how to contact the cancer center. Pt stable at discharge.

## 2023-05-04 ENCOUNTER — Ambulatory Visit: Payer: Medicare Other

## 2023-05-04 DIAGNOSIS — R278 Other lack of coordination: Secondary | ICD-10-CM

## 2023-05-04 DIAGNOSIS — M6281 Muscle weakness (generalized): Secondary | ICD-10-CM

## 2023-05-04 DIAGNOSIS — M25631 Stiffness of right wrist, not elsewhere classified: Secondary | ICD-10-CM

## 2023-05-04 DIAGNOSIS — M25531 Pain in right wrist: Secondary | ICD-10-CM

## 2023-05-04 NOTE — Addendum Note (Signed)
Addended byBlinda Leatherwood, Ciena Sampley H on: 05/04/2023 11:08 AM   Modules accepted: Orders

## 2023-05-05 ENCOUNTER — Inpatient Hospital Stay: Payer: Medicare Other

## 2023-05-05 VITALS — BP 117/63 | HR 65 | Temp 97.9°F | Resp 16

## 2023-05-05 DIAGNOSIS — R5383 Other fatigue: Secondary | ICD-10-CM | POA: Diagnosis not present

## 2023-05-05 DIAGNOSIS — E611 Iron deficiency: Secondary | ICD-10-CM

## 2023-05-05 DIAGNOSIS — I10 Essential (primary) hypertension: Secondary | ICD-10-CM | POA: Diagnosis not present

## 2023-05-05 DIAGNOSIS — Z79899 Other long term (current) drug therapy: Secondary | ICD-10-CM | POA: Diagnosis not present

## 2023-05-05 DIAGNOSIS — D509 Iron deficiency anemia, unspecified: Secondary | ICD-10-CM | POA: Diagnosis not present

## 2023-05-05 MED ORDER — IRON SUCROSE 20 MG/ML IV SOLN
200.0000 mg | INTRAVENOUS | Status: DC
  Administered 2023-05-05: 200 mg via INTRAVENOUS
  Filled 2023-05-05: qty 10

## 2023-05-05 MED ORDER — SODIUM CHLORIDE 0.9% FLUSH
10.0000 mL | Freq: Once | INTRAVENOUS | Status: DC | PRN
Start: 2023-05-05 — End: 2023-05-05
  Filled 2023-05-05: qty 10

## 2023-05-05 NOTE — Patient Instructions (Signed)

## 2023-05-07 NOTE — Therapy (Signed)
OUTPATIENT OCCUPATIONAL THERAPY ORTHO TREATMENT NOTE  Patient Name: [name] MRN: [id number] DOB:1950/09/11, 73 y.o., female Today's Date: 05/07/2023  PCP: Dr. Hillard Danker REFERRING PROVIDER: Dr. Rise Paganini  END OF SESSION:  OT End of Session - 05/07/23 1208     Visit Number 7    Number of Visits 12    Date for OT Re-Evaluation 06/14/23    Progress Note Due on Visit 10    OT Start Time 1530    OT Stop Time 1615    OT Time Calculation (min) 45 min    Activity Tolerance Patient tolerated treatment well    Behavior During Therapy WFL for tasks assessed/performed            Past Medical History:  Diagnosis Date   Arthritis    back, fingers with joint pain and swelling.  chronic back pain   Cataract    Chronic back pain    arthritis    Diverticulosis    Elevated cholesterol    takes Niacin daily and Simvastatin   GERD (gastroesophageal reflux disease) 06/2004   non-specific gastritis on EGD 06/2004   Headache(784.0)    occasionally   History of colon polyps 2004, 2009   2004:adenomatous. 2009 hyperplastic.    History of hiatal hernia    Hypertension    takes Tenoretic and Lisinopril daily   Mucoid cyst of joint 08/2013   right index finger   Neuromuscular disorder (HCC)    numbness in right leg after knee replacement   Osteopenia 01/2014   T score -1.1 FRAX 14%/0.5%. Stable from prior DEXA   PONV (postoperative nausea and vomiting)    Rotator cuff arthropathy    Left   Seasonal allergies    Sleep apnea    wears CPAP nightly   Stroke (HCC) 1998   x 2 - mild left-sided weakness   Urge incontinence    Uterine prolapse    Past Surgical History:  Procedure Laterality Date   BACK SURGERY     x 2   BELPHAROPTOSIS REPAIR Bilateral 12/14/2017   BUBBLE STUDY  02/16/2021   Procedure: BUBBLE STUDY;  Surgeon: Vesta Mixer, MD;  Location: [facility] ENDOSCOPY;  Service: Cardiovascular;;   CARPAL TUNNEL RELEASE Bilateral    Dr August Saucer   CATARACT EXTRACTION      CATARACT EXTRACTION Bilateral 10/2017   COLONOSCOPY  2004, 2009, 2014   FINGER SURGERY     GUM SURGERY     GYNECOLOGIC CRYOSURGERY     HYSTEROSCOPY WITH D & C  12/07/2010   with resection of endometrial polyp   JOINT REPLACEMENT     KNEE ARTHROSCOPY Left 2008   lip biopsy     done at MD office Fri 11/22/13   MASS EXCISION Right 08/15/2013   Procedure: RIGHT INDEX EXCISION MASS ;  Surgeon: Tami Ribas, MD;  Location: [facility];  Service: Orthopedics;  Laterality: Right;   NASAL SEPTUM SURGERY     OOPHORECTOMY Right 2000   REVERSE SHOULDER ARTHROPLASTY Left 12/11/2018   REVERSE SHOULDER ARTHROPLASTY Left 12/11/2018   Procedure: LEFT REVERSE SHOULDER ARTHROPLASTY;  Surgeon: Cammy Copa, MD;  Location: [facility] OR;  Service: Orthopedics;  Laterality: Left;   SHOULDER ARTHROSCOPY WITH OPEN ROTATOR CUFF REPAIR AND DISTAL CLAVICLE ACROMINECTOMY Right 11/26/2013   Procedure: RIGHT SHOULDER ARTHROSCOPY WITH MINI OPEN ROTATOR CUFF REPAIR AND DISTAL CLAVICLE RESECTION, SUBACROMIAL DECOMPRESSION, POSSIBLE [facility] PATCH.;  Surgeon: Valeria Batman, MD;  Location: [facility] OR;  Service: Orthopedics;  Laterality: Right;   TEE WITHOUT CARDIOVERSION N/A 02/16/2021   Procedure: TRANSESOPHAGEAL ECHOCARDIOGRAM (TEE);  Surgeon: Elease Hashimoto Deloris Ping, MD;  Location: [facility] ENDOSCOPY;  Service: Cardiovascular;  Laterality: N/A;   TOTAL KNEE ARTHROPLASTY Right 03/21/2017   TOTAL KNEE ARTHROPLASTY Right 03/21/2017   Procedure: RIGHT TOTAL KNEE ARTHROPLASTY;  Surgeon: Valeria Batman, MD;  Location: [facility] OR;  Service: Orthopedics;  Laterality: Right;   TOTAL KNEE ARTHROPLASTY Left 11/29/2022   Procedure: LEFT TOTAL KNEE ARTHROPLASTY;  Surgeon: Cammy Copa, MD;  Location: [facility] ORS;  Service: Orthopedics;  Laterality: Left;   TUBAL LIGATION     Patient Active Problem List   Diagnosis Date Noted   Unintentional weight loss 03/24/2023   S/P total knee replacement, left 11/29/2022   Urinary  incontinence 03/25/2022   ICH (intracerebral hemorrhage) (HCC) 02/13/2021   Iron deficiency 10/07/2020   Aortic atherosclerosis (HCC) 03/17/2020   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   Chronic left shoulder pain 08/23/2018   Arthritis of left knee 11/02/2016   Chronic diarrhea 12/07/2014   Segmental colitis with rectal bleeding (HCC) 09/12/2014   Routine general medical examination at a health care facility 06/05/2014   Overweight 06/13/2011   Hyperlipidemia 02/17/2007   Essential hypertension 01/08/2007   Osteoarthritis 01/08/2007   ONSET DATE: 11/30/22  REFERRING DIAG: Pain in R wrist; closed intra-articular fracture of distal end of right radius  THERAPY DIAG:  Muscle weakness (generalized)  Other lack of coordination  Pain in right wrist  Stiffness of right wrist joint  Rationale for Evaluation and Treatment: Rehabilitation  SUBJECTIVE:  SUBJECTIVE STATEMENT: Pt reports soreness in the R hand, but continues to be using the hand more for household tasks. Pt accompanied by: self  PERTINENT HISTORY:  Per MEDICAL RECORD NUMBERNancy B Grandmaison is a 73 y.o. female who presents to the office reporting decreasing pain from right wrist fracture.  She has seen another hand surgeon for evaluation of the right wrist and they have recommended occupational therapy which has been arranged.   After 2 negative xrays, MRI confirmed: Other closed intra-articular fracture of distal end of right radius, initial encounter (Primary Dx);  Additional PMH of: Primary osteoarthritis of right wrist;  Primary osteoarthritis of right hand  Carpal tunnel release B hands ~1.5 years ago Hx of CVA, treated in outpatient OT by this clinician  PRECAUTIONS: Other: ok to wear the wrist brace for comfort as needed  RED FLAGS: None   WEIGHT BEARING RESTRICTIONS: No  PAIN: 04/24/23: 3/10 R wrist this morning, 5-6/10 with activity Are you having pain? Yes: NPRS scale: 3 at rest, with activity  6/10 Pain location: dorsal wrist and hand, radial aspect Pain description: tight and a little sharp, a little achy Aggravating factors: weight bearing through the hand and movement Relieving factors: rest, Voltaren gel may help somewhat, OTC pain meds  FALLS: Has patient fallen in last 6 months? Yes. Number of falls 1 fall (cause of injury)  LIVING ENVIRONMENT: Lives with: lives with their spouse Lives in: 3 level home (stair lift to bonus room), pt gets on her gator (ATV) and goes around the house to access the basement Stairs: Yes: External: 2 steps; on right going up Has following equipment at home: Quad cane small base, shower chair, elevated toilet seat, grab bar at the toilet   PLOF: Independent  PATIENT GOALS: "To be able to use my R hand for my daily activities.  I'm R handed so I want to be as good as I can."  NEXT MD VISIT: Posey Rea Going to have an MRI for low back d/t worsening numbness in the R Leg.    OBJECTIVE:  Note: Objective measures were completed at Evaluation unless otherwise noted.  HAND DOMINANCE: Right  ADLs: Overall ADLs: modified indep with basic self care prior to injury.  Spouse currently assisting with opening bottles.  Pt endorses difficulty washing under L arm pit d/t lack of wrist mobility, spouse assists with lifting, carrying heavier items in kitchen.  Difficulty with tasks that require bending, pulling, or pushing from the wrist.  Difficulty putting in earrings.   FUNCTIONAL OUTCOME MEASURES: FOTO: 43; predicted 60  UPPER EXTREMITY ROM:     Active ROM Right eval Left eval  Shoulder flexion    Shoulder abduction    Shoulder adduction    Shoulder extension    Shoulder internal rotation    Shoulder external rotation    Elbow flexion    Elbow extension    Wrist flexion 29 49  Wrist extension 39 58  Wrist ulnar deviation 29 54  Wrist radial deviation 14 31  Wrist pronation 84 88  Wrist supination 64 76  (Blank rows = not  tested)  Active ROM Right eval Left eval  Thumb MCP (0-60)    Thumb IP (0-80)    Thumb Radial abd/add (0-55)     Thumb Palmar abd/add (0-45)     Thumb Opposition to Small Finger     Index MCP (0-90)     Index PIP (0-100)     Index DIP (0-70)      Long MCP (0-90)      Long PIP (0-100)      Long DIP (0-70)      Ring MCP (0-90)      Ring PIP (0-100)      Ring DIP (0-70)      Little MCP (0-90)      Little PIP (0-100)      Little DIP (0-70)      (Blank rows = not tested)   Full composite fist bilaterally, slightly painful on the R with full closure   Able to oppose all digits to thumb in bilat hands, some pain with R hand digit opposition  UPPER EXTREMITY MMT:     MMT Right eval Left eval  Shoulder flexion    Shoulder abduction    Shoulder adduction    Shoulder extension    Shoulder internal rotation    Shoulder external rotation    Middle trapezius    Lower trapezius    Elbow flexion    Elbow extension    Wrist flexion 4- 4+  Wrist extension 4- 4+  Wrist ulnar deviation 4- 4+  Wrist radial deviation 4- 4+  Wrist pronation 4- 4+  Wrist supination 4- 4+  (Blank rows = not tested)  HAND FUNCTION: Grip strength: Right: 21 lbs; Left: 20 (L side hx of shoulder replacement and affected by CVA) lbs, Lateral pinch: Right: 11 lbs, Left: 8 lbs, and 3 point pinch: Right: 5 lbs, Left: 6 lbs  COORDINATION: 9 Hole Peg test: Right: 41 sec; Left: 43 sec  SENSATION: R hand WFL, L hand thumb and index finger mildly numb (but improved since carpal tunnel release)  EDEMA: very mild swelling in R dorsal wrist   COGNITION: Overall cognitive status: Within functional limits for tasks assessed  OBSERVATIONS:  Pt known to this OT from previous OT to tx LUE weakness post CVA in 2022.  Pt eager to increase function in  R hand post wrist fx as she is R hand dominant.  Pt requests 1x per week d/t pt living in [place] which requires an extended drive to this Clinic.  Pt historically  adherent to HEPs so anticipate that 1x per week should be acceptable.  TODAY'S TREATMENT:                                                                                                                              DATE: 05/04/23: Paraffin:  X10 min for R wrist/hand pain management/muscle relaxation in prep for therapeutic exercises.  Manual Therapy: -Performed soft tissue massage to R thumb, all sides of wrist and forearm with focus to radial side, working to decrease pain and increase tissue extensibility for improving joint range in the wrist and hand. -Performed grade 1 and 2 joint mobs to R wrist and forearm all planes, working to decrease pain and increase mobility to wrist and forearm joint ranges.   Therapeutic Exercise: -Performed passive digit flex/ext, R thumb flex/ext, wrist ROM all planes, using heat around wrist and forearm throughout.   -1# dumbbell for R wrist ext, flex, radial deviation, and forearm pron/sup for 2 sets 7 reps each; min vc for form technique.  Issued visual handout for carryover at home.   PATIENT EDUCATION: Education details: HEP progression with 1# dumbell for R wrist strengthening Person educated: Patient Education method: Explanation Education comprehension: verbalized understanding  HOME EXERCISE PROGRAM: Light blue theraputty, self passive wrist and forearm ROM, AROM; 1# dumbbell for R wrist strengthening  GOALS: Goals reviewed with patient? Yes  SHORT TERM GOALS: Target date: 05/03/23  Pt will be indep to perform HEP for improving R wrist flexibility and strength for daily tasks. Baseline: Eval: Initiated theraputty and passive wrist flexibility exercises at eval; min vc with handouts Goal status: INITIAL  LONG TERM GOALS: Target date: 07/04/23  Pt will increase FOTO score to 60 or better to indicate improvement in self perceived functional use of the R hand with daily tasks. Baseline: Eval: 43 Goal status: INITIAL  2.  Pt will increase R  grip strength by 10 or more lbs to enable indep with opening jars/containers.  Baseline: Eval: R grip 21 lbs (spouse assists to open jars) Goal status: INITIAL  3.  Pt will increase R wrist flex/ext/supination by 7-10* to enable sufficient mobility to engage R dominant hand during grooming and eating.   Baseline: Eval: R wrist flex 29, ext 39, sup 64 (pt reports inability to put in earrings and difficulty bringing eating utensils to mouth) Goal status: INITIAL  4.  Pt will increase R wrist strength by 1/2 or more muscle grades in order to use R dominant hand for refilling dog food and water dishes.  Baseline: Eval: R wrist strength grossly 4-; pt uses L non-dominant hand to refill dog food and water dishes.  Goal status: INITIAL  ASSESSMENT: CLINICAL IMPRESSION:  Pt continues to respond well to paraffin, moist heat  during therapeutic exercises noted above, and soft tissue massage to promote increased ROM and reduced pain.  Able to progress to gentle 1# dumbbell exercises for R wrist and forearm strengthening all planes for 2 sets 7 reps.  Pt given visual handout to add these to her HEP; demonstrated good understanding of handout.  Pt continues to present with pain and R wrist and digit stiffness and weakness, impacting ability to efficiently use hand with ADL and IADL tasks.  Pt will continue to benefit from skilled OT to improve R wrist pain, R wrist mobility, and strength in order to increase functional use of the R dominant hand for daily tasks.   PERFORMANCE DEFICITS: in functional skills including ADLs, IADLs, coordination, sensation, edema, ROM, strength, pain, flexibility, Fine motor control, body mechanics, decreased knowledge of use of DME, and UE functional use, and psychosocial skills including coping strategies, environmental adaptation, habits, and routines and behaviors.   IMPAIRMENTS: are limiting patient from ADLs, IADLs, rest and sleep, and leisure.   COMORBIDITIES: has  co-morbidities such as L sided hemiparesis, OA bilat hands  that affects occupational performance. Patient will benefit from skilled OT to address above impairments and improve overall function.  MODIFICATION OR ASSISTANCE TO COMPLETE EVALUATION: No modification of tasks or assist necessary to complete an evaluation.  OT OCCUPATIONAL PROFILE AND HISTORY: Problem focused assessment: Including review of records relating to presenting problem.  CLINICAL DECISION MAKING: Moderate - several treatment options, min-mod task modification necessary  REHAB POTENTIAL: Good  EVALUATION COMPLEXITY: Low      PLAN:  OT FREQUENCY: 1-2x/week  OT DURATION: 12 weeks  PLANNED INTERVENTIONS: 97168 OT Re-evaluation, 97535 self care/ADL training, 81191 therapeutic exercise, 97530 therapeutic activity, 97140 manual therapy, 97035 ultrasound, 97018 paraffin, 47829 moist heat, 97010 cryotherapy, 97034 contrast bath, 97033 iontophoresis, 97760 Orthotics management and training, 56213 Splinting (initial encounter), [id number] Subsequent splinting/medication, passive range of motion, psychosocial skills training, coping strategies training, patient/family education, and DME and/or AE instructions  RECOMMENDED OTHER SERVICES: None at this time  CONSULTED AND AGREED WITH PLAN OF CARE: Patient  PLAN FOR NEXT SESSION: See above   Danelle Earthly, MS, OTR/L  Otis Dials, OT 05/07/2023, 12:09 PM

## 2023-05-08 ENCOUNTER — Ambulatory Visit: Payer: Medicare Other

## 2023-05-08 DIAGNOSIS — M6281 Muscle weakness (generalized): Secondary | ICD-10-CM | POA: Diagnosis not present

## 2023-05-08 DIAGNOSIS — M25531 Pain in right wrist: Secondary | ICD-10-CM

## 2023-05-08 DIAGNOSIS — R278 Other lack of coordination: Secondary | ICD-10-CM | POA: Diagnosis not present

## 2023-05-08 DIAGNOSIS — M25631 Stiffness of right wrist, not elsewhere classified: Secondary | ICD-10-CM

## 2023-05-08 NOTE — Therapy (Signed)
OUTPATIENT OCCUPATIONAL THERAPY ORTHO TREATMENT NOTE  Patient Name: [name] MRN: [id number] DOB:12-27-1950, 73 y.o., female Today's Date: 05/08/2023  PCP: Dr. Hillard Danker REFERRING PROVIDER: Dr. Rise Paganini  END OF SESSION:  OT End of Session - 05/08/23 1106     Visit Number 8    Number of Visits 12    Date for OT Re-Evaluation 06/14/23    Progress Note Due on Visit 10    OT Start Time 1100    OT Stop Time 1145    OT Time Calculation (min) 45 min    Activity Tolerance Patient tolerated treatment well    Behavior During Therapy WFL for tasks assessed/performed            Past Medical History:  Diagnosis Date   Arthritis    back, fingers with joint pain and swelling.  chronic back pain   Cataract    Chronic back pain    arthritis    Diverticulosis    Elevated cholesterol    takes Niacin daily and Simvastatin   GERD (gastroesophageal reflux disease) 06/2004   non-specific gastritis on EGD 06/2004   Headache(784.0)    occasionally   History of colon polyps 2004, 2009   2004:adenomatous. 2009 hyperplastic.    History of hiatal hernia    Hypertension    takes Tenoretic and Lisinopril daily   Mucoid cyst of joint 08/2013   right index finger   Neuromuscular disorder (HCC)    numbness in right leg after knee replacement   Osteopenia 01/2014   T score -1.1 FRAX 14%/0.5%. Stable from prior DEXA   PONV (postoperative nausea and vomiting)    Rotator cuff arthropathy    Left   Seasonal allergies    Sleep apnea    wears CPAP nightly   Stroke (HCC) 1998   x 2 - mild left-sided weakness   Urge incontinence    Uterine prolapse    Past Surgical History:  Procedure Laterality Date   BACK SURGERY     x 2   BELPHAROPTOSIS REPAIR Bilateral 12/14/2017   BUBBLE STUDY  02/16/2021   Procedure: BUBBLE STUDY;  Surgeon: Vesta Mixer, MD;  Location: [facility] ENDOSCOPY;  Service: Cardiovascular;;   CARPAL TUNNEL RELEASE Bilateral    Dr August Saucer   CATARACT EXTRACTION      CATARACT EXTRACTION Bilateral 10/2017   COLONOSCOPY  2004, 2009, 2014   FINGER SURGERY     GUM SURGERY     GYNECOLOGIC CRYOSURGERY     HYSTEROSCOPY WITH D & C  12/07/2010   with resection of endometrial polyp   JOINT REPLACEMENT     KNEE ARTHROSCOPY Left 2008   lip biopsy     done at MD office Fri 11/22/13   MASS EXCISION Right 08/15/2013   Procedure: RIGHT INDEX EXCISION MASS ;  Surgeon: Tami Ribas, MD;  Location: [facility];  Service: Orthopedics;  Laterality: Right;   NASAL SEPTUM SURGERY     OOPHORECTOMY Right 2000   REVERSE SHOULDER ARTHROPLASTY Left 12/11/2018   REVERSE SHOULDER ARTHROPLASTY Left 12/11/2018   Procedure: LEFT REVERSE SHOULDER ARTHROPLASTY;  Surgeon: Cammy Copa, MD;  Location: [facility] OR;  Service: Orthopedics;  Laterality: Left;   SHOULDER ARTHROSCOPY WITH OPEN ROTATOR CUFF REPAIR AND DISTAL CLAVICLE ACROMINECTOMY Right 11/26/2013   Procedure: RIGHT SHOULDER ARTHROSCOPY WITH MINI OPEN ROTATOR CUFF REPAIR AND DISTAL CLAVICLE RESECTION, SUBACROMIAL DECOMPRESSION, POSSIBLE [facility] PATCH.;  Surgeon: Valeria Batman, MD;  Location: [facility] OR;  Service: Orthopedics;  Laterality: Right;   TEE WITHOUT CARDIOVERSION N/A 02/16/2021   Procedure: TRANSESOPHAGEAL ECHOCARDIOGRAM (TEE);  Surgeon: Elease Hashimoto Deloris Ping, MD;  Location: [facility] ENDOSCOPY;  Service: Cardiovascular;  Laterality: N/A;   TOTAL KNEE ARTHROPLASTY Right 03/21/2017   TOTAL KNEE ARTHROPLASTY Right 03/21/2017   Procedure: RIGHT TOTAL KNEE ARTHROPLASTY;  Surgeon: Valeria Batman, MD;  Location: [facility] OR;  Service: Orthopedics;  Laterality: Right;   TOTAL KNEE ARTHROPLASTY Left 11/29/2022   Procedure: LEFT TOTAL KNEE ARTHROPLASTY;  Surgeon: Cammy Copa, MD;  Location: [facility] ORS;  Service: Orthopedics;  Laterality: Left;   TUBAL LIGATION     Patient Active Problem List   Diagnosis Date Noted   Unintentional weight loss 03/24/2023   S/P total knee replacement, left 11/29/2022   Urinary  incontinence 03/25/2022   ICH (intracerebral hemorrhage) (HCC) 02/13/2021   Iron deficiency 10/07/2020   Aortic atherosclerosis (HCC) 03/17/2020   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   Chronic left shoulder pain 08/23/2018   Arthritis of left knee 11/02/2016   Chronic diarrhea 12/07/2014   Segmental colitis with rectal bleeding (HCC) 09/12/2014   Routine general medical examination at a health care facility 06/05/2014   Overweight 06/13/2011   Hyperlipidemia 02/17/2007   Essential hypertension 01/08/2007   Osteoarthritis 01/08/2007   ONSET DATE: 11/30/22  REFERRING DIAG: Pain in R wrist; closed intra-articular fracture of distal end of right radius  THERAPY DIAG:  Muscle weakness (generalized)  Other lack of coordination  Pain in right wrist  Stiffness of right wrist joint  Rationale for Evaluation and Treatment: Rehabilitation  SUBJECTIVE:  SUBJECTIVE STATEMENT: Pt reports she'll have her 3rd iron infusion this week, and was told by her provider that she would likely start to feel the benefits of the infusions after 3+ infusions.  Pt accompanied by: self  PERTINENT HISTORY:  Per MEDICAL RECORD NUMBERNancy B Haley is a 73 y.o. female who presents to the office reporting decreasing pain from right wrist fracture.  She has seen another hand surgeon for evaluation of the right wrist and they have recommended occupational therapy which has been arranged.   After 2 negative xrays, MRI confirmed: Other closed intra-articular fracture of distal end of right radius, initial encounter (Primary Dx);  Additional PMH of: Primary osteoarthritis of right wrist;  Primary osteoarthritis of right hand  Carpal tunnel release B hands ~1.5 years ago Hx of CVA, treated in outpatient OT by this clinician  PRECAUTIONS: Other: ok to wear the wrist brace for comfort as needed  RED FLAGS: None   WEIGHT BEARING RESTRICTIONS: No  PAIN: 05/08/23: 4-5/10 R wrist this morning, 5-6/10 with  activity Are you having pain? Yes: NPRS scale: 3 at rest, with activity 6/10 Pain location: dorsal wrist and hand, radial aspect Pain description: tight and a little sharp, a little achy Aggravating factors: weight bearing through the hand and movement Relieving factors: rest, Voltaren gel may help somewhat, OTC pain meds  FALLS: Has patient fallen in last 6 months? Yes. Number of falls 1 fall (cause of injury)  LIVING ENVIRONMENT: Lives with: lives with their spouse Lives in: 3 level home (stair lift to bonus room), pt gets on her gator (ATV) and goes around the house to access the basement Stairs: Yes: External: 2 steps; on right going up Has following equipment at home: Quad cane small base, shower chair, elevated toilet seat, grab bar at the toilet   PLOF: Independent  PATIENT GOALS: "To be able to use my R hand for my daily activities.  I'm R handed so I want to be as good as I can."   NEXT MD VISIT: Posey Rea Going to have an MRI for low back d/t worsening numbness in the R Leg.    OBJECTIVE:  Note: Objective measures were completed at Evaluation unless otherwise noted.  HAND DOMINANCE: Right  ADLs: Overall ADLs: modified indep with basic self care prior to injury.  Spouse currently assisting with opening bottles.  Pt endorses difficulty washing under L arm pit d/t lack of wrist mobility, spouse assists with lifting, carrying heavier items in kitchen.  Difficulty with tasks that require bending, pulling, or pushing from the wrist.  Difficulty putting in earrings.   FUNCTIONAL OUTCOME MEASURES: FOTO: 43; predicted 60  UPPER EXTREMITY ROM:     Active ROM Right eval Left eval  Shoulder flexion    Shoulder abduction    Shoulder adduction    Shoulder extension    Shoulder internal rotation    Shoulder external rotation    Elbow flexion    Elbow extension    Wrist flexion 29 49  Wrist extension 39 58  Wrist ulnar deviation 29 54  Wrist radial deviation 14 31  Wrist  pronation 84 88  Wrist supination 64 76  (Blank rows = not tested)  Active ROM Right eval Left eval  Thumb MCP (0-60)    Thumb IP (0-80)    Thumb Radial abd/add (0-55)     Thumb Palmar abd/add (0-45)     Thumb Opposition to Small Finger     Index MCP (0-90)     Index PIP (0-100)     Index DIP (0-70)      Long MCP (0-90)      Long PIP (0-100)      Long DIP (0-70)      Ring MCP (0-90)      Ring PIP (0-100)      Ring DIP (0-70)      Little MCP (0-90)      Little PIP (0-100)      Little DIP (0-70)      (Blank rows = not tested)   Full composite fist bilaterally, slightly painful on the R with full closure   Able to oppose all digits to thumb in bilat hands, some pain with R hand digit opposition  UPPER EXTREMITY MMT:     MMT Right eval Left eval  Shoulder flexion    Shoulder abduction    Shoulder adduction    Shoulder extension    Shoulder internal rotation    Shoulder external rotation    Middle trapezius    Lower trapezius    Elbow flexion    Elbow extension    Wrist flexion 4- 4+  Wrist extension 4- 4+  Wrist ulnar deviation 4- 4+  Wrist radial deviation 4- 4+  Wrist pronation 4- 4+  Wrist supination 4- 4+  (Blank rows = not tested)  HAND FUNCTION: Grip strength: Right: 21 lbs; Left: 20 (L side hx of shoulder replacement and affected by CVA) lbs, Lateral pinch: Right: 11 lbs, Left: 8 lbs, and 3 point pinch: Right: 5 lbs, Left: 6 lbs  COORDINATION: 9 Hole Peg test: Right: 41 sec; Left: 43 sec  SENSATION: R hand WFL, L hand thumb and index finger mildly numb (but improved since carpal tunnel release)  EDEMA: very mild swelling in R dorsal wrist   COGNITION: Overall cognitive status: Within functional limits for tasks assessed  OBSERVATIONS:  Pt known to this OT from previous OT  to tx LUE weakness post CVA in 2022.  Pt eager to increase function in R hand post wrist fx as she is R hand dominant.  Pt requests 1x per week d/t pt living in [place] which  requires an extended drive to this Clinic.  Pt historically adherent to HEPs so anticipate that 1x per week should be acceptable.  TODAY'S TREATMENT:                                                                                                                              DATE: 05/08/23: Paraffin:  X10 min for R wrist/hand pain management/muscle relaxation in prep for therapeutic exercises.  Manual Therapy: -Performed soft tissue massage to R thumb, all sides of wrist and forearm with focus to radial side, working to decrease pain and increase tissue extensibility for improving joint range in the wrist and hand. -Performed grade 1 and 2 joint mobs to R wrist and forearm all planes, working to decrease pain and increase mobility to wrist and forearm joint ranges.   Therapeutic Exercise: -Performed passive wrist ROM all planes, using heat around wrist and forearm throughout.   -Wrist maze x3 reps with min vc for positioning to reduce compensation -EZboard to promote R hand, wrist, and forearm strengthening turning long handled knob against wide strip of velcro, and large base key turn and large dial turn against narrow strip of velcro, completing 3 reps (up and down board=1 rep) of each.   PATIENT EDUCATION: Education details: RUE strengthening Person educated: Patient Education method: Explanation, demo Education comprehension: verbalized understanding  HOME EXERCISE PROGRAM: Light blue theraputty, self passive wrist and forearm ROM, AROM; 1# dumbbell for R wrist strengthening  GOALS: Goals reviewed with patient? Yes  SHORT TERM GOALS: Target date: 05/03/23  Pt will be indep to perform HEP for improving R wrist flexibility and strength for daily tasks. Baseline: Eval: Initiated theraputty and passive wrist flexibility exercises at eval; min vc with handouts Goal status: INITIAL  LONG TERM GOALS: Target date: 07/04/23  Pt will increase FOTO score to 60 or better to indicate  improvement in self perceived functional use of the R hand with daily tasks. Baseline: Eval: 43 Goal status: INITIAL  2.  Pt will increase R grip strength by 10 or more lbs to enable indep with opening jars/containers.  Baseline: Eval: R grip 21 lbs (spouse assists to open jars) Goal status: INITIAL  3.  Pt will increase R wrist flex/ext/supination by 7-10* to enable sufficient mobility to engage R dominant hand during grooming and eating.   Baseline: Eval: R wrist flex 29, ext 39, sup 64 (pt reports inability to put in earrings and difficulty bringing eating utensils to mouth) Goal status: INITIAL  4.  Pt will increase R wrist strength by 1/2 or more muscle grades in order to use R dominant hand for refilling dog food and water dishes.  Baseline: Eval: R wrist strength grossly 4-; pt uses L  non-dominant hand to refill dog food and water dishes.  Goal status: INITIAL  ASSESSMENT: CLINICAL IMPRESSION:  Pt continues to respond well to paraffin, moist heat during therapeutic exercises noted above, and soft tissue massage to promote increased ROM and reduced pain.  Pt verbalized being quite sore following 1# dumbbell exercises last session, but acknowledged using her hand more and more, and often not remembering to take an OTC pain medication.  Pt did verbalize that she continues to benefit from use of heat at home to manage pain and stiffness.  Pt continues to present with pain and R wrist and digit stiffness and weakness, impacting ability to efficiently use hand with ADL and IADL tasks.  Pt will continue to benefit from skilled OT to improve R wrist pain, R wrist mobility, and strength in order to increase functional use of the R dominant hand for daily tasks.   PERFORMANCE DEFICITS: in functional skills including ADLs, IADLs, coordination, sensation, edema, ROM, strength, pain, flexibility, Fine motor control, body mechanics, decreased knowledge of use of DME, and UE functional use, and  psychosocial skills including coping strategies, environmental adaptation, habits, and routines and behaviors.   IMPAIRMENTS: are limiting patient from ADLs, IADLs, rest and sleep, and leisure.   COMORBIDITIES: has co-morbidities such as L sided hemiparesis, OA bilat hands  that affects occupational performance. Patient will benefit from skilled OT to address above impairments and improve overall function.  MODIFICATION OR ASSISTANCE TO COMPLETE EVALUATION: No modification of tasks or assist necessary to complete an evaluation.  OT OCCUPATIONAL PROFILE AND HISTORY: Problem focused assessment: Including review of records relating to presenting problem.  CLINICAL DECISION MAKING: Moderate - several treatment options, min-mod task modification necessary  REHAB POTENTIAL: Good  EVALUATION COMPLEXITY: Low      PLAN:  OT FREQUENCY: 1-2x/week  OT DURATION: 12 weeks  PLANNED INTERVENTIONS: 97168 OT Re-evaluation, 97535 self care/ADL training, 09811 therapeutic exercise, 97530 therapeutic activity, 97140 manual therapy, 97035 ultrasound, 97018 paraffin, 91478 moist heat, 97010 cryotherapy, 97034 contrast bath, 97033 iontophoresis, 97760 Orthotics management and training, 29562 Splinting (initial encounter), [id number] Subsequent splinting/medication, passive range of motion, psychosocial skills training, coping strategies training, patient/family education, and DME and/or AE instructions  RECOMMENDED OTHER SERVICES: None at this time  CONSULTED AND AGREED WITH PLAN OF CARE: Patient  PLAN FOR NEXT SESSION: See above   Danelle Earthly, MS, OTR/L  [employer] Dials, OT 05/08/2023, 1:10 PM

## 2023-05-10 ENCOUNTER — Inpatient Hospital Stay: Payer: Medicare Other

## 2023-05-10 VITALS — BP 108/68 | HR 68 | Temp 97.7°F

## 2023-05-10 DIAGNOSIS — M5416 Radiculopathy, lumbar region: Secondary | ICD-10-CM | POA: Diagnosis not present

## 2023-05-10 DIAGNOSIS — M9904 Segmental and somatic dysfunction of sacral region: Secondary | ICD-10-CM | POA: Diagnosis not present

## 2023-05-10 DIAGNOSIS — Z79899 Other long term (current) drug therapy: Secondary | ICD-10-CM | POA: Diagnosis not present

## 2023-05-10 DIAGNOSIS — M5388 Other specified dorsopathies, sacral and sacrococcygeal region: Secondary | ICD-10-CM | POA: Diagnosis not present

## 2023-05-10 DIAGNOSIS — M9903 Segmental and somatic dysfunction of lumbar region: Secondary | ICD-10-CM | POA: Diagnosis not present

## 2023-05-10 DIAGNOSIS — D509 Iron deficiency anemia, unspecified: Secondary | ICD-10-CM | POA: Diagnosis not present

## 2023-05-10 DIAGNOSIS — M9902 Segmental and somatic dysfunction of thoracic region: Secondary | ICD-10-CM | POA: Diagnosis not present

## 2023-05-10 DIAGNOSIS — I10 Essential (primary) hypertension: Secondary | ICD-10-CM | POA: Diagnosis not present

## 2023-05-10 DIAGNOSIS — M546 Pain in thoracic spine: Secondary | ICD-10-CM | POA: Diagnosis not present

## 2023-05-10 DIAGNOSIS — R5383 Other fatigue: Secondary | ICD-10-CM | POA: Diagnosis not present

## 2023-05-10 DIAGNOSIS — E611 Iron deficiency: Secondary | ICD-10-CM

## 2023-05-10 MED ORDER — SODIUM CHLORIDE 0.9% FLUSH
10.0000 mL | Freq: Once | INTRAVENOUS | Status: AC | PRN
  Administered 2023-05-10: 10 mL
  Filled 2023-05-10: qty 10

## 2023-05-10 MED ORDER — IRON SUCROSE 20 MG/ML IV SOLN
200.0000 mg | INTRAVENOUS | Status: DC
Start: 2023-05-10 — End: 2023-05-10
  Administered 2023-05-10: 200 mg via INTRAVENOUS
  Filled 2023-05-10: qty 10

## 2023-05-10 NOTE — Patient Instructions (Signed)

## 2023-05-11 NOTE — Progress Notes (Signed)
[name] - 73 y.o. female MRN [id number]  Date of birth: Nov 20, 1950  Office Visit Note: Visit Date: 05/02/2023 PCP: Myrlene Broker, MD Referred by: Myrlene Broker, *  Subjective: Chief Complaint  Patient presents with   Lower Back - Pain   HPI:  [name] is a 73 y.o. female who comes in today at the request of Dr. Burnard Bunting for planned Right L5-S1 Lumbar Interlaminar epidural steroid injection with fluoroscopic guidance.  The patient has failed conservative care including home exercise, medications, time and activity modification.  This injection will be diagnostic and hopefully therapeutic.  Please see requesting physician notes for further details and justification.   ROS Otherwise per HPI.  Assessment & Plan: Visit Diagnoses:    ICD-10-CM   1. Lumbar radiculopathy  M54.16 XR C-ARM NO REPORT    Epidural Steroid injection    methylPREDNISolone acetate (DEPO-MEDROL) injection 40 mg      Plan: No additional findings.   Meds & Orders:  Meds ordered this encounter  Medications   methylPREDNISolone acetate (DEPO-MEDROL) injection 40 mg    Orders Placed This Encounter  Procedures   XR C-ARM NO REPORT   Epidural Steroid injection    Follow-up: Return for visit to requesting provider as needed.   Procedures: No procedures performed  Lumbar Epidural Steroid Injection - Interlaminar Approach with Fluoroscopic Guidance  Patient: [name]      Date of Birth: 01/27/1951 MRN: [id number] PCP: Myrlene Broker, MD      Visit Date: 05/02/2023   Universal Protocol:     Consent Given By: the patient  Position: PRONE  Additional Comments: Vital signs were monitored before and after the procedure. Patient was prepped and draped in the usual sterile fashion. The correct patient, procedure, and site was verified.   Injection Procedure Details:   Procedure diagnoses: Lumbar radiculopathy [M54.16]   Meds Administered:  Meds  ordered this encounter  Medications   methylPREDNISolone acetate (DEPO-MEDROL) injection 40 mg     Laterality: Right  Location/Site:  L5-S1  Needle: 3.5 in., 20 ga. Tuohy  Needle Placement: Paramedian epidural  Findings:   -Comments: Excellent flow of contrast into the epidural space.  Procedure Details: Using a paramedian approach from the side mentioned above, the region overlying the inferior lamina was localized under fluoroscopic visualization and the soft tissues overlying this structure were infiltrated with 4 ml. of 1% Lidocaine without Epinephrine. The Tuohy needle was inserted into the epidural space using a paramedian approach.   The epidural space was localized using loss of resistance along with counter oblique bi-planar fluoroscopic views.  After negative aspirate for air, blood, and CSF, a 2 ml. volume of Isovue-250 was injected into the epidural space and the flow of contrast was observed. Radiographs were obtained for documentation purposes.    The injectate was administered into the level noted above.   Additional Comments:  No complications occurred Dressing: 2 x 2 sterile gauze and Band-Aid    Post-procedure details: Patient was observed during the procedure. Post-procedure instructions were reviewed.  Patient left the clinic in stable condition.   Clinical History: Narrative & Impression CLINICAL DATA:  Right-sided radiculopathy. Chronic midline low back pain   EXAM: MRI LUMBAR SPINE WITHOUT CONTRAST   TECHNIQUE: Multiplanar, multisequence MR imaging of the lumbar spine was performed. No intravenous contrast was administered.   COMPARISON:  None Available.   FINDINGS: Segmentation:  Standard.   Alignment:  Grade 1 anterolisthesis at L2-3  Vertebrae:  No fracture, evidence of discitis, or bone lesion.   Conus medullaris and cauda equina: Conus extends to the L1 level. Conus and cauda equina appear normal.   Paraspinal and other soft  tissues: Negative.   Disc levels:   T11-12: Small disc bulge with mild bilateral foraminal stenosis   T12-L1: Intermediate sized disc bulge with narrowing of the lateral recesses. No central spinal canal stenosis. Moderate bilateral foraminal stenosis.   L1-L2: Normal disc and facets. No spinal canal stenosis. No neural foraminal stenosis.   L2-L3: Moderate facet hypertrophy with small disc bulge. No spinal canal stenosis. Mild left neural foraminal stenosis.   L3-L4: Mild facet hypertrophy and intermediate sized disc bulge with endplate spurring. Right lateral recess narrowing without central spinal canal stenosis. Mild left neural foraminal stenosis.   L4-L5: Small disc bulge with endplate spurring. Right lateral recess narrowing without central spinal canal stenosis. Mild right neural foraminal stenosis.   L5-S1: Small central disc protrusion superimposed on intermediate sized disc bulge. Right lateral recess narrowing without central spinal canal stenosis. Severe right and mild left neural foraminal stenosis.   Visualized sacrum: Normal.   IMPRESSION: 1. Severe right L5-S1 neural foraminal stenosis due to combination of disc bulge and facet arthrosis. 2. Moderate bilateral T12-L1 neural foraminal stenosis. 3. Right lateral recess narrowing at L3-4, L4-5 and L5-S1 may affect the descending right L4, L5 and S1 nerve roots, respectively.     Electronically Signed   By: Deatra Robinson M.D.   On: 03/31/2023 23:00     Objective:  VS:  HT:    WT:   BMI:     BP:   HR: bpm  TEMP: ( )  RESP:  Physical Exam Vitals and nursing note reviewed.  Constitutional:      General: She is not in acute distress.    Appearance: Normal appearance. She is not ill-appearing.  HENT:     Head: Normocephalic and atraumatic.     Right Ear: External ear normal.     Left Ear: External ear normal.  Eyes:     Extraocular Movements: Extraocular movements intact.  Cardiovascular:      Rate and Rhythm: Normal rate.     Pulses: Normal pulses.  Pulmonary:     Effort: Pulmonary effort is normal. No respiratory distress.  Abdominal:     General: There is no distension.     Palpations: Abdomen is soft.  Musculoskeletal:        General: Tenderness present.     Cervical back: Neck supple.     Right lower leg: No edema.     Left lower leg: No edema.     Comments: Patient has good distal strength with no pain over the greater trochanters.  No clonus or focal weakness.  Skin:    Findings: No erythema, lesion or rash.  Neurological:     General: No focal deficit present.     Mental Status: She is alert and oriented to person, place, and time.     Sensory: No sensory deficit.     Motor: No weakness or abnormal muscle tone.     Coordination: Coordination normal.  Psychiatric:        Mood and Affect: Mood normal.        Behavior: Behavior normal.      Imaging: No results found.

## 2023-05-11 NOTE — Procedures (Signed)
Lumbar Epidural Steroid Injection - Interlaminar Approach with Fluoroscopic Guidance  Patient: [name]      Date of Birth: 05-23-1950 MRN: [id number] PCP: Myrlene Broker, MD      Visit Date: 05/02/2023   Universal Protocol:     Consent Given By: the patient  Position: PRONE  Additional Comments: Vital signs were monitored before and after the procedure. Patient was prepped and draped in the usual sterile fashion. The correct patient, procedure, and site was verified.   Injection Procedure Details:   Procedure diagnoses: Lumbar radiculopathy [M54.16]   Meds Administered:  Meds ordered this encounter  Medications   methylPREDNISolone acetate (DEPO-MEDROL) injection 40 mg     Laterality: Right  Location/Site:  L5-S1  Needle: 3.5 in., 20 ga. Tuohy  Needle Placement: Paramedian epidural  Findings:   -Comments: Excellent flow of contrast into the epidural space.  Procedure Details: Using a paramedian approach from the side mentioned above, the region overlying the inferior lamina was localized under fluoroscopic visualization and the soft tissues overlying this structure were infiltrated with 4 ml. of 1% Lidocaine without Epinephrine. The Tuohy needle was inserted into the epidural space using a paramedian approach.   The epidural space was localized using loss of resistance along with counter oblique bi-planar fluoroscopic views.  After negative aspirate for air, blood, and CSF, a 2 ml. volume of Isovue-250 was injected into the epidural space and the flow of contrast was observed. Radiographs were obtained for documentation purposes.    The injectate was administered into the level noted above.   Additional Comments:  No complications occurred Dressing: 2 x 2 sterile gauze and Band-Aid    Post-procedure details: Patient was observed during the procedure. Post-procedure instructions were reviewed.  Patient left the clinic in stable condition.

## 2023-05-12 ENCOUNTER — Inpatient Hospital Stay: Payer: Medicare Other

## 2023-05-12 VITALS — BP 114/71 | HR 74 | Resp 16

## 2023-05-12 DIAGNOSIS — Z79899 Other long term (current) drug therapy: Secondary | ICD-10-CM | POA: Diagnosis not present

## 2023-05-12 DIAGNOSIS — R5383 Other fatigue: Secondary | ICD-10-CM | POA: Diagnosis not present

## 2023-05-12 DIAGNOSIS — I10 Essential (primary) hypertension: Secondary | ICD-10-CM | POA: Diagnosis not present

## 2023-05-12 DIAGNOSIS — E611 Iron deficiency: Secondary | ICD-10-CM

## 2023-05-12 DIAGNOSIS — D509 Iron deficiency anemia, unspecified: Secondary | ICD-10-CM | POA: Diagnosis not present

## 2023-05-12 MED ORDER — IRON SUCROSE 20 MG/ML IV SOLN
200.0000 mg | INTRAVENOUS | Status: DC
Start: 2023-05-12 — End: 2023-05-12
  Administered 2023-05-12: 200 mg via INTRAVENOUS
  Filled 2023-05-12: qty 10

## 2023-05-15 ENCOUNTER — Inpatient Hospital Stay: Payer: Medicare Other | Attending: Oncology

## 2023-05-15 VITALS — BP 116/85 | HR 91 | Temp 98.7°F | Resp 16

## 2023-05-15 DIAGNOSIS — D509 Iron deficiency anemia, unspecified: Secondary | ICD-10-CM | POA: Diagnosis not present

## 2023-05-15 DIAGNOSIS — E611 Iron deficiency: Secondary | ICD-10-CM

## 2023-05-15 MED ORDER — IRON SUCROSE 20 MG/ML IV SOLN
200.0000 mg | INTRAVENOUS | Status: DC
  Administered 2023-05-15: 200 mg via INTRAVENOUS
  Filled 2023-05-15: qty 10

## 2023-05-15 NOTE — Patient Instructions (Signed)

## 2023-05-15 NOTE — Progress Notes (Signed)
Pt tolerated treatment without complaints.  VSS.  Pt refused 30 minute post observation period.  Pt understands risks.   

## 2023-05-16 ENCOUNTER — Encounter: Payer: Self-pay | Admitting: Oncology

## 2023-05-18 ENCOUNTER — Ambulatory Visit: Payer: Medicare Other | Attending: Orthopedic Surgery

## 2023-05-18 DIAGNOSIS — M6281 Muscle weakness (generalized): Secondary | ICD-10-CM | POA: Insufficient documentation

## 2023-05-18 DIAGNOSIS — R278 Other lack of coordination: Secondary | ICD-10-CM | POA: Insufficient documentation

## 2023-05-18 DIAGNOSIS — M25531 Pain in right wrist: Secondary | ICD-10-CM | POA: Insufficient documentation

## 2023-05-18 DIAGNOSIS — M25631 Stiffness of right wrist, not elsewhere classified: Secondary | ICD-10-CM | POA: Insufficient documentation

## 2023-05-21 NOTE — Therapy (Signed)
 OUTPATIENT OCCUPATIONAL THERAPY ORTHO TREATMENT NOTE  Patient Name: MARIDEE SLAPE MRN: [id number] DOB:June 28, 1950, 73 y.o., female Today's Date: 05/21/2023  PCP: Dr. Almarie Cleveland REFERRING PROVIDER: Dr. Cordella Hutchinson  END OF SESSION:  OT End of Session - 05/21/23 2011     Visit Number 9    Number of Visits 12    Date for OT Re-Evaluation 06/14/23    Progress Note Due on Visit 10    OT Start Time 1445    OT Stop Time 1530    OT Time Calculation (min) 45 min    Activity Tolerance Patient tolerated treatment well    Behavior During Therapy WFL for tasks assessed/performed            Past Medical History:  Diagnosis Date   Arthritis    back, fingers with joint pain and swelling.  chronic back pain   Cataract    Chronic back pain    arthritis    Diverticulosis    Elevated cholesterol    takes Niacin daily and Simvastatin    GERD (gastroesophageal reflux disease) 06/2004   non-specific gastritis on EGD 06/2004   Headache(784.0)    occasionally   History of colon polyps 2004, 2009   2004:adenomatous. 2009 hyperplastic.    History of hiatal hernia    Hypertension    takes Tenoretic  and Lisinopril  daily   Mucoid cyst of joint 08/2013   right index finger   Neuromuscular disorder (HCC)    numbness in right leg after knee replacement   Osteopenia 01/2014   T score -1.1 FRAX 14%/0.5%. Stable from prior DEXA   PONV (postoperative nausea and vomiting)    Rotator cuff arthropathy    Left   Seasonal allergies    Sleep apnea    wears CPAP nightly   Stroke (HCC) 1998   x 2 - mild left-sided weakness   Urge incontinence    Uterine prolapse    Past Surgical History:  Procedure Laterality Date   BACK SURGERY     x 2   BELPHAROPTOSIS REPAIR Bilateral 12/14/2017   BUBBLE STUDY  02/16/2021   Procedure: BUBBLE STUDY;  Surgeon: Alveta Aleene PARAS, MD;  Location: [facility] ENDOSCOPY;  Service: Cardiovascular;;   CARPAL TUNNEL RELEASE Bilateral    Dr Hutchinson   CATARACT EXTRACTION      CATARACT EXTRACTION Bilateral 10/2017   COLONOSCOPY  2004, 2009, 2014   FINGER SURGERY     GUM SURGERY     GYNECOLOGIC CRYOSURGERY     HYSTEROSCOPY WITH D & C  12/07/2010   with resection of endometrial polyp   JOINT REPLACEMENT     KNEE ARTHROSCOPY Left 2008   lip biopsy     done at MD office Fri 11/22/13   MASS EXCISION Right 08/15/2013   Procedure: RIGHT INDEX EXCISION MASS ;  Surgeon: Franky JONELLE Curia, MD;  Location: [facility];  Service: Orthopedics;  Laterality: Right;   NASAL SEPTUM SURGERY     OOPHORECTOMY Right 2000   REVERSE SHOULDER ARTHROPLASTY Left 12/11/2018   REVERSE SHOULDER ARTHROPLASTY Left 12/11/2018   Procedure: LEFT REVERSE SHOULDER ARTHROPLASTY;  Surgeon: Hutchinson Cordella Hamilton, MD;  Location: [facility] OR;  Service: Orthopedics;  Laterality: Left;   SHOULDER ARTHROSCOPY WITH OPEN ROTATOR CUFF REPAIR AND DISTAL CLAVICLE ACROMINECTOMY Right 11/26/2013   Procedure: RIGHT SHOULDER ARTHROSCOPY WITH MINI OPEN ROTATOR CUFF REPAIR AND DISTAL CLAVICLE RESECTION, SUBACROMIAL DECOMPRESSION, POSSIBLE [facility] PATCH.;  Surgeon: Maude LELON Right, MD;  Location: [facility] OR;  Service: Orthopedics;  Laterality: Right;   TEE WITHOUT CARDIOVERSION N/A 02/16/2021   Procedure: TRANSESOPHAGEAL ECHOCARDIOGRAM (TEE);  Surgeon: Alveta Aleene PARAS, MD;  Location: [facility] ENDOSCOPY;  Service: Cardiovascular;  Laterality: N/A;   TOTAL KNEE ARTHROPLASTY Right 03/21/2017   TOTAL KNEE ARTHROPLASTY Right 03/21/2017   Procedure: RIGHT TOTAL KNEE ARTHROPLASTY;  Surgeon: Anderson Maude ORN, MD;  Location: [facility] OR;  Service: Orthopedics;  Laterality: Right;   TOTAL KNEE ARTHROPLASTY Left 11/29/2022   Procedure: LEFT TOTAL KNEE ARTHROPLASTY;  Surgeon: Addie Cordella Hamilton, MD;  Location: [facility] ORS;  Service: Orthopedics;  Laterality: Left;   TUBAL LIGATION     Patient Active Problem List   Diagnosis Date Noted   Unintentional weight loss 03/24/2023   S/P total knee replacement, left 11/29/2022   Urinary  incontinence 03/25/2022   ICH (intracerebral hemorrhage) (HCC) 02/13/2021   Iron  deficiency 10/07/2020   Aortic atherosclerosis (HCC) 03/17/2020   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   Chronic left shoulder pain 08/23/2018   Arthritis of left knee 11/02/2016   Chronic diarrhea 12/07/2014   Segmental colitis with rectal bleeding (HCC) 09/12/2014   Routine general medical examination at a health care facility 06/05/2014   Overweight 06/13/2011   Hyperlipidemia 02/17/2007   Essential hypertension 01/08/2007   Osteoarthritis 01/08/2007   ONSET DATE: 11/30/22  REFERRING DIAG: Pain in R wrist; closed intra-articular fracture of distal end of right radius  THERAPY DIAG:  Muscle weakness (generalized)  Other lack of coordination  Pain in right wrist  Stiffness of right wrist joint  Rationale for Evaluation and Treatment: Rehabilitation  SUBJECTIVE:  SUBJECTIVE STATEMENT: Pt reports her pain seems to be improving little by little, and pt verbalizes continuing to increase her attempts at using her R hand with daily tasks.  Pt accompanied by: self  PERTINENT HISTORY:  Per MEDICAL RECORD NUMBERNancy B Battenfield is a 73 y.o. female who presents to the office reporting decreasing pain from right wrist fracture.  She has seen another hand surgeon for evaluation of the right wrist and they have recommended occupational therapy which has been arranged.   After 2 negative xrays, MRI confirmed: Other closed intra-articular fracture of distal end of right radius, initial encounter (Primary Dx);  Additional PMH of: Primary osteoarthritis of right wrist;  Primary osteoarthritis of right hand  Carpal tunnel release B hands ~1.5 years ago Hx of CVA, treated in outpatient OT by this clinician  PRECAUTIONS: Other: ok to wear the wrist brace for comfort as needed  RED FLAGS: None   WEIGHT BEARING RESTRICTIONS: No  PAIN: 05/18/23: 2-3/10 R wrist  Are you having pain? Yes: NPRS scale: 3 at  rest, with activity 6/10 Pain location: dorsal wrist and hand, radial aspect Pain description: tight and a little sharp, a little achy Aggravating factors: weight bearing through the hand and movement Relieving factors: rest, Voltaren  gel may help somewhat, OTC pain meds  FALLS: Has patient fallen in last 6 months? Yes. Number of falls 1 fall (cause of injury)  LIVING ENVIRONMENT: Lives with: lives with their spouse Lives in: 3 level home (stair lift to bonus room), pt gets on her gator (ATV) and goes around the house to access the basement Stairs: Yes: External: 2 steps; on right going up Has following equipment at home: Quad cane small base, shower chair, elevated toilet seat, grab bar at the toilet   PLOF: Independent  PATIENT GOALS: To be able to use my R hand for my daily activities.  I'm R handed so I want to  be as good as I can.   NEXT MD VISIT: Kurt Going to have an MRI for low back d/t worsening numbness in the R Leg.    OBJECTIVE:  Note: Objective measures were completed at Evaluation unless otherwise noted.  HAND DOMINANCE: Right  ADLs: Overall ADLs: modified indep with basic self care prior to injury.  Spouse currently assisting with opening bottles.  Pt endorses difficulty washing under L arm pit d/t lack of wrist mobility, spouse assists with lifting, carrying heavier items in kitchen.  Difficulty with tasks that require bending, pulling, or pushing from the wrist.  Difficulty putting in earrings.   FUNCTIONAL OUTCOME MEASURES: FOTO: 43; predicted 60  UPPER EXTREMITY ROM:     Active ROM Right eval Left eval  Shoulder flexion    Shoulder abduction    Shoulder adduction    Shoulder extension    Shoulder internal rotation    Shoulder external rotation    Elbow flexion    Elbow extension    Wrist flexion 29 49  Wrist extension 39 58  Wrist ulnar deviation 29 54  Wrist radial deviation 14 31  Wrist pronation 84 88  Wrist supination 64 76  (Blank rows =  not tested)  Active ROM Right eval Left eval  Thumb MCP (0-60)    Thumb IP (0-80)    Thumb Radial abd/add (0-55)     Thumb Palmar abd/add (0-45)     Thumb Opposition to Small Finger     Index MCP (0-90)     Index PIP (0-100)     Index DIP (0-70)      Long MCP (0-90)      Long PIP (0-100)      Long DIP (0-70)      Ring MCP (0-90)      Ring PIP (0-100)      Ring DIP (0-70)      Little MCP (0-90)      Little PIP (0-100)      Little DIP (0-70)      (Blank rows = not tested)   Full composite fist bilaterally, slightly painful on the R with full closure   Able to oppose all digits to thumb in bilat hands, some pain with R hand digit opposition  UPPER EXTREMITY MMT:     MMT Right eval Left eval  Shoulder flexion    Shoulder abduction    Shoulder adduction    Shoulder extension    Shoulder internal rotation    Shoulder external rotation    Middle trapezius    Lower trapezius    Elbow flexion    Elbow extension    Wrist flexion 4- 4+  Wrist extension 4- 4+  Wrist ulnar deviation 4- 4+  Wrist radial deviation 4- 4+  Wrist pronation 4- 4+  Wrist supination 4- 4+  (Blank rows = not tested)  HAND FUNCTION: Grip strength: Right: 21 lbs; Left: 20 (L side hx of shoulder replacement and affected by CVA) lbs, Lateral pinch: Right: 11 lbs, Left: 8 lbs, and 3 point pinch: Right: 5 lbs, Left: 6 lbs  COORDINATION: 9 Hole Peg test: Right: 41 sec; Left: 43 sec  SENSATION: R hand WFL, L hand thumb and index finger mildly numb (but improved since carpal tunnel release)  EDEMA: very mild swelling in R dorsal wrist   COGNITION: Overall cognitive status: Within functional limits for tasks assessed  OBSERVATIONS:  Pt known to this OT from previous OT to tx LUE weakness post CVA in  2022.  Pt eager to increase function in R hand post wrist fx as she is R hand dominant.  Pt requests 1x per week d/t pt living in [place] which requires an extended drive to this Clinic.  Pt historically  adherent to HEPs so anticipate that 1x per week should be acceptable.  TODAY'S TREATMENT:                                                                                                                              DATE: 05/18/23: Paraffin:  X10 min for R wrist/hand pain management/muscle relaxation in prep for therapeutic exercises.  Manual Therapy: -Performed soft tissue massage to R thumb, all sides of wrist and forearm with focus to radial side, working to decrease pain and increase tissue extensibility for improving joint range in the wrist and hand. -Performed grade 1,2, and 3  joint mobs to R wrist and forearm all planes, working to decrease pain and increase mobility to wrist and forearm joint ranges.   Therapeutic Exercise: -Performed passive wrist ROM all planes, using heat around wrist and forearm throughout.   -Trialed 2# wrist weight for wrist flex/ext; will keep at 1# (pain with 2#)   PATIENT EDUCATION: Education details: RUE strengthening Person educated: Patient Education method: Explanation, demo Education comprehension: verbalized understanding  HOME EXERCISE PROGRAM: Light blue theraputty, self passive wrist and forearm ROM, AROM; 1# dumbbell for R wrist strengthening  GOALS: Goals reviewed with patient? Yes  SHORT TERM GOALS: Target date: 05/03/23  Pt will be indep to perform HEP for improving R wrist flexibility and strength for daily tasks. Baseline: Eval: Initiated theraputty and passive wrist flexibility exercises at eval; min vc with handouts Goal status: INITIAL  LONG TERM GOALS: Target date: 07/04/23  Pt will increase FOTO score to 60 or better to indicate improvement in self perceived functional use of the R hand with daily tasks. Baseline: Eval: 43 Goal status: INITIAL  2.  Pt will increase R grip strength by 10 or more lbs to enable indep with opening jars/containers.  Baseline: Eval: R grip 21 lbs (spouse assists to open jars) Goal status:  INITIAL  3.  Pt will increase R wrist flex/ext/supination by 7-10* to enable sufficient mobility to engage R dominant hand during grooming and eating.   Baseline: Eval: R wrist flex 29, ext 39, sup 64 (pt reports inability to put in earrings and difficulty bringing eating utensils to mouth) Goal status: INITIAL  4.  Pt will increase R wrist strength by 1/2 or more muscle grades in order to use R dominant hand for refilling dog food and water  dishes.  Baseline: Eval: R wrist strength grossly 4-; pt uses L non-dominant hand to refill dog food and water  dishes.  Goal status: INITIAL  ASSESSMENT: CLINICAL IMPRESSION:  Pt continues to respond well to paraffin, moist heat during therapeutic exercises noted above, and soft tissue massage to promote increased ROM and reduced pain.  Pt reports  her pain seems to be improving little by little, and pt verbalizes continuing to increase her attempts at using her R hand with daily tasks.  Not yet able to tolerate 2# dumbbell; advised pt to keep at 1# when performing her HEP.  Pt continues to present with pain and R wrist and digit stiffness and weakness, impacting ability to efficiently use hand with ADL and IADL tasks.  Pt will continue to benefit from skilled OT to improve R wrist pain, R wrist mobility, and strength in order to increase functional use of the R dominant hand for daily tasks.   PERFORMANCE DEFICITS: in functional skills including ADLs, IADLs, coordination, sensation, edema, ROM, strength, pain, flexibility, Fine motor control, body mechanics, decreased knowledge of use of DME, and UE functional use, and psychosocial skills including coping strategies, environmental adaptation, habits, and routines and behaviors.   IMPAIRMENTS: are limiting patient from ADLs, IADLs, rest and sleep, and leisure.   COMORBIDITIES: has co-morbidities such as L sided hemiparesis, OA bilat hands  that affects occupational performance. Patient will benefit from skilled  OT to address above impairments and improve overall function.  MODIFICATION OR ASSISTANCE TO COMPLETE EVALUATION: No modification of tasks or assist necessary to complete an evaluation.  OT OCCUPATIONAL PROFILE AND HISTORY: Problem focused assessment: Including review of records relating to presenting problem.  CLINICAL DECISION MAKING: Moderate - several treatment options, min-mod task modification necessary  REHAB POTENTIAL: Good  EVALUATION COMPLEXITY: Low      PLAN:  OT FREQUENCY: 1-2x/week  OT DURATION: 12 weeks  PLANNED INTERVENTIONS: 97168 OT Re-evaluation, 97535 self care/ADL training, 02889 therapeutic exercise, 97530 therapeutic activity, 97140 manual therapy, 97035 ultrasound, 97018 paraffin, 02989 moist heat, 97010 cryotherapy, 97034 contrast bath, 97033 iontophoresis, 97760 Orthotics management and training, 02239 Splinting (initial encounter), [id number] Subsequent splinting/medication, passive range of motion, psychosocial skills training, coping strategies training, patient/family education, and DME and/or AE instructions  RECOMMENDED OTHER SERVICES: None at this time  CONSULTED AND AGREED WITH PLAN OF CARE: Patient  PLAN FOR NEXT SESSION: 10th visit progress note  Inocente Blazing, MS, OTR/L  Inocente MARLA Blazing, OT 05/21/2023, 8:12 PM

## 2023-05-22 DIAGNOSIS — M9903 Segmental and somatic dysfunction of lumbar region: Secondary | ICD-10-CM | POA: Diagnosis not present

## 2023-05-22 DIAGNOSIS — M9904 Segmental and somatic dysfunction of sacral region: Secondary | ICD-10-CM | POA: Diagnosis not present

## 2023-05-22 DIAGNOSIS — M5416 Radiculopathy, lumbar region: Secondary | ICD-10-CM | POA: Diagnosis not present

## 2023-05-22 DIAGNOSIS — M546 Pain in thoracic spine: Secondary | ICD-10-CM | POA: Diagnosis not present

## 2023-05-22 DIAGNOSIS — M5388 Other specified dorsopathies, sacral and sacrococcygeal region: Secondary | ICD-10-CM | POA: Diagnosis not present

## 2023-05-22 DIAGNOSIS — M9902 Segmental and somatic dysfunction of thoracic region: Secondary | ICD-10-CM | POA: Diagnosis not present

## 2023-05-25 ENCOUNTER — Ambulatory Visit: Payer: Medicare Other

## 2023-05-25 DIAGNOSIS — M6281 Muscle weakness (generalized): Secondary | ICD-10-CM | POA: Diagnosis not present

## 2023-05-25 DIAGNOSIS — M25631 Stiffness of right wrist, not elsewhere classified: Secondary | ICD-10-CM | POA: Diagnosis not present

## 2023-05-25 DIAGNOSIS — M25531 Pain in right wrist: Secondary | ICD-10-CM

## 2023-05-25 DIAGNOSIS — R278 Other lack of coordination: Secondary | ICD-10-CM | POA: Diagnosis not present

## 2023-05-25 NOTE — Therapy (Signed)
OUTPATIENT OCCUPATIONAL THERAPY ORTHO PROGRESS AND TREATMENT NOTE Progress reporting period beginning 03/22/23-05/25/23  Patient Name: [name] MRN: [id number] DOB:20-Jan-1951, 73 y.o., female Today's Date: 05/25/2023  PCP: Dr. Hillard Haley REFERRING PROVIDER: Dr. Rise Paganini  END OF SESSION:  OT End of Session - 05/25/23 1449     Visit Number 10    Number of Visits 12    Date for OT Re-Evaluation 06/14/23    Progress Note Due on Visit 10    OT Start Time 1445    OT Stop Time 1530    OT Time Calculation (min) 45 min    Activity Tolerance Patient tolerated treatment well    Behavior During Therapy WFL for tasks assessed/performed            Past Medical History:  Diagnosis Date   Arthritis    back, fingers with joint pain and swelling.  chronic back pain   Cataract    Chronic back pain    arthritis    Diverticulosis    Elevated cholesterol    takes Niacin daily and Simvastatin   GERD (gastroesophageal reflux disease) 06/2004   non-specific gastritis on EGD 06/2004   Headache(784.0)    occasionally   History of colon polyps 2004, 2009   2004:adenomatous. 2009 hyperplastic.    History of hiatal hernia    Hypertension    takes Tenoretic and Lisinopril daily   Mucoid cyst of joint 08/2013   right index finger   Neuromuscular disorder (HCC)    numbness in right leg after knee replacement   Osteopenia 01/2014   T score -1.1 FRAX 14%/0.5%. Stable from prior DEXA   PONV (postoperative nausea and vomiting)    Rotator cuff arthropathy    Left   Seasonal allergies    Sleep apnea    wears CPAP nightly   Stroke (HCC) 1998   x 2 - mild left-sided weakness   Urge incontinence    Uterine prolapse    Past Surgical History:  Procedure Laterality Date   BACK SURGERY     x 2   BELPHAROPTOSIS REPAIR Bilateral 12/14/2017   BUBBLE STUDY  02/16/2021   Procedure: BUBBLE STUDY;  Surgeon: Vesta Mixer, MD;  Location: [facility] ENDOSCOPY;  Service: Cardiovascular;;    CARPAL TUNNEL RELEASE Bilateral    Dr August Saucer   CATARACT EXTRACTION     CATARACT EXTRACTION Bilateral 10/2017   COLONOSCOPY  2004, 2009, 2014   FINGER SURGERY     GUM SURGERY     GYNECOLOGIC CRYOSURGERY     HYSTEROSCOPY WITH D & C  12/07/2010   with resection of endometrial polyp   JOINT REPLACEMENT     KNEE ARTHROSCOPY Left 2008   lip biopsy     done at MD office Fri 11/22/13   MASS EXCISION Right 08/15/2013   Procedure: RIGHT INDEX EXCISION MASS ;  Surgeon: Tami Ribas, MD;  Location: [facility];  Service: Orthopedics;  Laterality: Right;   NASAL SEPTUM SURGERY     OOPHORECTOMY Right 2000   REVERSE SHOULDER ARTHROPLASTY Left 12/11/2018   REVERSE SHOULDER ARTHROPLASTY Left 12/11/2018   Procedure: LEFT REVERSE SHOULDER ARTHROPLASTY;  Surgeon: Cammy Copa, MD;  Location: [facility] OR;  Service: Orthopedics;  Laterality: Left;   SHOULDER ARTHROSCOPY WITH OPEN ROTATOR CUFF REPAIR AND DISTAL CLAVICLE ACROMINECTOMY Right 11/26/2013   Procedure: RIGHT SHOULDER ARTHROSCOPY WITH MINI OPEN ROTATOR CUFF REPAIR AND DISTAL CLAVICLE RESECTION, SUBACROMIAL DECOMPRESSION, POSSIBLE [facility] PATCH.;  Surgeon: Valeria Batman, MD;  Location: [facility] OR;  Service: Orthopedics;  Laterality: Right;   TEE WITHOUT CARDIOVERSION N/A 02/16/2021   Procedure: TRANSESOPHAGEAL ECHOCARDIOGRAM (TEE);  Surgeon: Elease Hashimoto Deloris Ping, MD;  Location: [facility] ENDOSCOPY;  Service: Cardiovascular;  Laterality: N/A;   TOTAL KNEE ARTHROPLASTY Right 03/21/2017   TOTAL KNEE ARTHROPLASTY Right 03/21/2017   Procedure: RIGHT TOTAL KNEE ARTHROPLASTY;  Surgeon: Valeria Batman, MD;  Location: [facility] OR;  Service: Orthopedics;  Laterality: Right;   TOTAL KNEE ARTHROPLASTY Left 11/29/2022   Procedure: LEFT TOTAL KNEE ARTHROPLASTY;  Surgeon: Cammy Copa, MD;  Location: [facility] ORS;  Service: Orthopedics;  Laterality: Left;   TUBAL LIGATION     Patient Active Problem List   Diagnosis Date Noted   Unintentional weight loss  03/24/2023   S/P total knee replacement, left 11/29/2022   Urinary incontinence 03/25/2022   ICH (intracerebral hemorrhage) (HCC) 02/13/2021   Iron deficiency 10/07/2020   Aortic atherosclerosis (HCC) 03/17/2020   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   Chronic left shoulder pain 08/23/2018   Arthritis of left knee 11/02/2016   Chronic diarrhea 12/07/2014   Segmental colitis with rectal bleeding (HCC) 09/12/2014   Routine general medical examination at a health care facility 06/05/2014   Overweight 06/13/2011   Hyperlipidemia 02/17/2007   Essential hypertension 01/08/2007   Osteoarthritis 01/08/2007   ONSET DATE: 11/30/22  REFERRING DIAG: Pain in R wrist; closed intra-articular fracture of distal end of right radius  THERAPY DIAG:  Muscle weakness (generalized)  Other lack of coordination  Pain in right wrist  Stiffness of right wrist joint  Rationale for Evaluation and Treatment: Rehabilitation  SUBJECTIVE:  SUBJECTIVE STATEMENT: Pt acknowledges that she is using her hand more, but that it does still get sore. Pt accompanied by: self  PERTINENT HISTORY:  Per MEDICAL RECORD NUMBERNancy B Haley is a 73 y.o. female who presents to the office reporting decreasing pain from right wrist fracture.  She has seen another hand surgeon for evaluation of the right wrist and they have recommended occupational therapy which has been arranged.   After 2 negative xrays, MRI confirmed: Other closed intra-articular fracture of distal end of right radius, initial encounter (Primary Dx);  Additional PMH of: Primary osteoarthritis of right wrist;  Primary osteoarthritis of right hand  Carpal tunnel release B hands ~1.5 years ago Hx of CVA, treated in outpatient OT by this clinician  PRECAUTIONS: Other: ok to wear the wrist brace for comfort as needed  RED FLAGS: None   WEIGHT BEARING RESTRICTIONS: No  PAIN: 05/25/23: 3/10 R wrist  Are you having pain? Yes: NPRS scale: 3 at rest,  with activity 6/10 Pain location: dorsal wrist and hand, radial aspect Pain description: tight and a little sharp, a little achy Aggravating factors: weight bearing through the hand and movement Relieving factors: rest, Voltaren gel may help somewhat, OTC pain meds  FALLS: Has patient fallen in last 6 months? Yes. Number of falls 1 fall (cause of injury)  LIVING ENVIRONMENT: Lives with: lives with their spouse Lives in: 3 level home (stair lift to bonus room), pt gets on her gator (ATV) and goes around the house to access the basement Stairs: Yes: External: 2 steps; on right going up Has following equipment at home: Quad cane small base, shower chair, elevated toilet seat, grab bar at the toilet   PLOF: Independent  PATIENT GOALS: "To be able to use my R hand for my daily activities.  I'm R handed so I want to be as good as I  can."   NEXT MD VISIT: Posey Rea Going to have an MRI for low back d/t worsening numbness in the R Leg.    OBJECTIVE:  Note: Objective measures were completed at Evaluation unless otherwise noted.  HAND DOMINANCE: Right  ADLs: Overall ADLs: modified indep with basic self care prior to injury.  Spouse currently assisting with opening bottles.  Pt endorses difficulty washing under L arm pit d/t lack of wrist mobility, spouse assists with lifting, carrying heavier items in kitchen.  Difficulty with tasks that require bending, pulling, or pushing from the wrist.  Difficulty putting in earrings.   FUNCTIONAL OUTCOME MEASURES: FOTO: 43; predicted 60 05/25/23: 50  UPPER EXTREMITY ROM:     Active ROM Right eval Right 05/25/23 Left eval Left 05/25/23  Shoulder flexion      Shoulder abduction      Shoulder adduction      Shoulder extension      Shoulder internal rotation      Shoulder external rotation      Elbow flexion      Elbow extension      Wrist flexion 29 59 49 65  Wrist extension 39 50 58 65  Wrist ulnar deviation 29 35 54   Wrist radial deviation 14  23 31    Wrist pronation 84 90 88   Wrist supination 64 75 76   (Blank rows = not tested)  Active ROM Right eval Left eval  Thumb MCP (0-60)    Thumb IP (0-80)    Thumb Radial abd/add (0-55)     Thumb Palmar abd/add (0-45)     Thumb Opposition to Small Finger     Index MCP (0-90)     Index PIP (0-100)     Index DIP (0-70)      Long MCP (0-90)      Long PIP (0-100)      Long DIP (0-70)      Ring MCP (0-90)      Ring PIP (0-100)      Ring DIP (0-70)      Little MCP (0-90)      Little PIP (0-100)      Little DIP (0-70)      (Blank rows = not tested)   Full composite fist bilaterally, slightly painful on the R with full closure   Able to oppose all digits to thumb in bilat hands, some pain with R hand digit opposition    UPPER EXTREMITY MMT:     MMT Right eval Right 05/25/23 Left eval  Shoulder flexion     Shoulder abduction     Shoulder adduction     Shoulder extension     Shoulder internal rotation     Shoulder external rotation     Middle trapezius     Lower trapezius     Elbow flexion     Elbow extension     Wrist flexion 4- 4 4+  Wrist extension 4- 4 4+  Wrist ulnar deviation 4- 4 4+  Wrist radial deviation 4- 4 4+  Wrist pronation 4- 4 4+  Wrist supination 4- 4 4+  (Blank rows = not tested)  HAND FUNCTION: Grip strength: Right: 21 lbs; Left: 20 (L side hx of shoulder replacement and affected by CVA) lbs, Lateral pinch: Right: 11 lbs, Left: 8 lbs, and 3 point pinch: Right: 5 lbs, Left: 6 lbs 05/25/23:  Grip strength: Right: 25 lbs; Left: 25 lbs, Lateral pinch: Right: 10 lbs, Left: 9 lbs; 3 point point  pinch: Right: 5 lbs, Left: 8 lbs  COORDINATION: 9 Hole Peg test: Right: 41 sec; Left: 43 sec  SENSATION: R hand WFL, L hand thumb and index finger mildly numb (but improved since carpal tunnel release)  EDEMA: very mild swelling in R dorsal wrist   COGNITION: Overall cognitive status: Within functional limits for tasks assessed  OBSERVATIONS:  Pt known  to this OT from previous OT to tx LUE weakness post CVA in 2022.  Pt eager to increase function in R hand post wrist fx as she is R hand dominant.  Pt requests 1x per week d/t pt living in [place] which requires an extended drive to this Clinic.  Pt historically adherent to HEPs so anticipate that 1x per week should be acceptable.  TODAY'S TREATMENT:                                                                                                                              DATE: 05/25/23: Paraffin:  X10 min for R wrist/hand pain management/muscle relaxation in prep for therapeutic exercises.  Therapeutic Activity: Objective measures taken and goals updated and reviewed for progress note.  Therapeutic Exercise: -HEP review -HEP addition: self passive wrist extension stretching with supinated forearm (position needed for R dominant hand to perform peri hygiene)   PATIENT EDUCATION: Education details: progress towards goals, HEP review/progression  Person educated: Patient Education method: Explanation, demo, visual handout issued Education comprehension: verbalized understanding and returned demonstration  HOME EXERCISE PROGRAM: Light blue theraputty, self passive wrist and forearm ROM, AROM; 1# dumbbell for R wrist strengthening  GOALS: Goals reviewed with patient? Yes  SHORT TERM GOALS: Target date: 05/03/23  Pt will be indep to perform HEP for improving R wrist flexibility and strength for daily tasks. Baseline: Eval: Initiated theraputty and passive wrist flexibility exercises at eval; min vc with handouts; 05/25/23: consistent to perform and indep with visual handouts Goal status: achieved  LONG TERM GOALS: Target date: 07/04/23  Pt will increase FOTO score to 60 or better to indicate improvement in self perceived functional use of the R hand with daily tasks. Baseline: Eval: 43; 05/25/23: 50 Goal status: ongoing  2.  Pt will increase R grip strength by 10 or more lbs to enable  indep with opening jars/containers.  Baseline: Eval: R grip 21 lbs (spouse assists to open jars); 05/25/23: R grip 25 lbs Goal status: ongoing  3.  Pt will increase R wrist flex/ext/supination by 7-10* to enable sufficient mobility to engage R dominant hand during grooming and eating (revised to include use of R hand for peri hygiene). Baseline: Eval: R wrist flex 29, ext 39, sup 64 (pt reports inability to put in earrings and difficulty bringing eating utensils to mouth); 05/25/23: R wrist flex 59, ext 50, sup 75 (pt can put in her earrings with extra time, manipulate eating utensils with R hand, but reports not yet able to manage peri hygiene with the R hand).  Goal status: ongoing  4.  Pt will increase R wrist strength by 1/2 or more muscle grades in order to use R dominant hand for refilling dog food and water dishes.  Baseline: Eval: R wrist strength grossly 4-; pt uses L non-dominant hand to refill dog food and water dishes; 05/25/23: R wrist strength grossly 4-/5; pt now able to refill dog food and water dishes with R hand. Goal status: achieved  ASSESSMENT: CLINICAL IMPRESSION:  Pt seen for 10th visit progress update.  FOTO score has improved from 43 to 50.  R hand grip strength and all wrist and forearm flexibility and strength measures have improved (see above).  Pain has improved from 6/10 pain with activity to 3/10 or less with activity.  Pt continues to respond well to paraffin, moist heat during therapeutic exercises, and soft tissue massage to promote increased ROM and reduced pain.  Pt still presents with weakness in the R hand which limits efficiency and tolerance for cutting food, and presents with insufficient wrist extension and supination to enable pt to manage peri hygiene with the R dominant hand.  Self passive stretching reviewed this date to target ROM to meet this goal.  Pt will continue to benefit from skilled OT to improve R wrist pain, R wrist mobility, and strength in order  to increase functional use of the R dominant hand for daily tasks.   PERFORMANCE DEFICITS: in functional skills including ADLs, IADLs, coordination, sensation, edema, ROM, strength, pain, flexibility, Fine motor control, body mechanics, decreased knowledge of use of DME, and UE functional use, and psychosocial skills including coping strategies, environmental adaptation, habits, and routines and behaviors.   IMPAIRMENTS: are limiting patient from ADLs, IADLs, rest and sleep, and leisure.   COMORBIDITIES: has co-morbidities such as L sided hemiparesis, OA bilat hands  that affects occupational performance. Patient will benefit from skilled OT to address above impairments and improve overall function.  MODIFICATION OR ASSISTANCE TO COMPLETE EVALUATION: No modification of tasks or assist necessary to complete an evaluation.  OT OCCUPATIONAL PROFILE AND HISTORY: Problem focused assessment: Including review of records relating to presenting problem.  CLINICAL DECISION MAKING: Moderate - several treatment options, min-mod task modification necessary  REHAB POTENTIAL: Good  EVALUATION COMPLEXITY: Low      PLAN:  OT FREQUENCY: 1-2x/week  OT DURATION: 12 weeks  PLANNED INTERVENTIONS: 97168 OT Re-evaluation, 97535 self care/ADL training, 81191 therapeutic exercise, 97530 therapeutic activity, 97140 manual therapy, 97035 ultrasound, 97018 paraffin, 47829 moist heat, 97010 cryotherapy, 97034 contrast bath, 97033 iontophoresis, 97760 Orthotics management and training, 56213 Splinting (initial encounter), [id number] Subsequent splinting/medication, passive range of motion, psychosocial skills training, coping strategies training, patient/family education, and DME and/or AE instructions  RECOMMENDED OTHER SERVICES: None at this time  CONSULTED AND AGREED WITH PLAN OF CARE: Patient  PLAN FOR NEXT SESSION: see above  Danelle Earthly, MS, OTR/L  Otis Dials, OT 05/25/2023, 2:50 PM

## 2023-06-01 ENCOUNTER — Ambulatory Visit: Payer: Medicare Other

## 2023-06-02 ENCOUNTER — Ambulatory Visit: Payer: Medicare Other

## 2023-06-02 ENCOUNTER — Other Ambulatory Visit: Payer: Self-pay | Admitting: Internal Medicine

## 2023-06-02 DIAGNOSIS — M25631 Stiffness of right wrist, not elsewhere classified: Secondary | ICD-10-CM | POA: Diagnosis not present

## 2023-06-02 DIAGNOSIS — R278 Other lack of coordination: Secondary | ICD-10-CM | POA: Diagnosis not present

## 2023-06-02 DIAGNOSIS — M6281 Muscle weakness (generalized): Secondary | ICD-10-CM

## 2023-06-02 DIAGNOSIS — M25531 Pain in right wrist: Secondary | ICD-10-CM

## 2023-06-02 NOTE — Therapy (Signed)
OUTPATIENT OCCUPATIONAL THERAPY ORTHO TREATMENT NOTE  Patient Name: [name] MRN: [id number] DOB:[birth year], 73 y.o., female Today's Date: 06/02/2023  PCP: Dr. Hillard Danker REFERRING PROVIDER: Dr. Rise Paganini  END OF SESSION:  OT End of Session - 06/02/23 0939     Visit Number 11    Number of Visits 12    Date for OT Re-Evaluation 06/14/23    Progress Note Due on Visit 10    OT Start Time 0932    OT Stop Time 1015    OT Time Calculation (min) 43 min    Activity Tolerance Patient tolerated treatment well    Behavior During Therapy WFL for tasks assessed/performed            Past Medical History:  Diagnosis Date   Arthritis    back, fingers with joint pain and swelling.  chronic back pain   Cataract    Chronic back pain    arthritis    Diverticulosis    Elevated cholesterol    takes Niacin daily and Simvastatin   GERD (gastroesophageal reflux disease) 06/2004   non-specific gastritis on EGD 06/2004   Headache(784.0)    occasionally   History of colon polyps 2004, 2009   2004:adenomatous. 2009 hyperplastic.    History of hiatal hernia    Hypertension    takes Tenoretic and Lisinopril daily   Mucoid cyst of joint 08/2013   right index finger   Neuromuscular disorder (HCC)    numbness in right leg after knee replacement   Osteopenia 01/2014   T score -1.1 FRAX 14%/0.5%. Stable from prior DEXA   PONV (postoperative nausea and vomiting)    Rotator cuff arthropathy    Left   Seasonal allergies    Sleep apnea    wears CPAP nightly   Stroke (HCC) 1998   x 2 - mild left-sided weakness   Urge incontinence    Uterine prolapse    Past Surgical History:  Procedure Laterality Date   BACK SURGERY     x 2   BELPHAROPTOSIS REPAIR Bilateral 12/14/2017   BUBBLE STUDY  02/16/2021   Procedure: BUBBLE STUDY;  Surgeon: Vesta Mixer, MD;  Location: [facility] ENDOSCOPY;  Service: Cardiovascular;;   CARPAL TUNNEL RELEASE Bilateral    Dr August Saucer   CATARACT  EXTRACTION     CATARACT EXTRACTION Bilateral 10/2017   COLONOSCOPY  2004, 2009, 2014   FINGER SURGERY     GUM SURGERY     GYNECOLOGIC CRYOSURGERY     HYSTEROSCOPY WITH D & C  12/07/2010   with resection of endometrial polyp   JOINT REPLACEMENT     KNEE ARTHROSCOPY Left 2008   lip biopsy     done at MD office Fri 11/22/13   MASS EXCISION Right 08/15/2013   Procedure: RIGHT INDEX EXCISION MASS ;  Surgeon: Tami Ribas, MD;  Location: [facility];  Service: Orthopedics;  Laterality: Right;   NASAL SEPTUM SURGERY     OOPHORECTOMY Right 2000   REVERSE SHOULDER ARTHROPLASTY Left 12/11/2018   REVERSE SHOULDER ARTHROPLASTY Left 12/11/2018   Procedure: LEFT REVERSE SHOULDER ARTHROPLASTY;  Surgeon: Cammy Copa, MD;  Location: [facility] OR;  Service: Orthopedics;  Laterality: Left;   SHOULDER ARTHROSCOPY WITH OPEN ROTATOR CUFF REPAIR AND DISTAL CLAVICLE ACROMINECTOMY Right 11/26/2013   Procedure: RIGHT SHOULDER ARTHROSCOPY WITH MINI OPEN ROTATOR CUFF REPAIR AND DISTAL CLAVICLE RESECTION, SUBACROMIAL DECOMPRESSION, POSSIBLE [facility] PATCH.;  Surgeon: Valeria Batman, MD;  Location: [facility] OR;  Service: Orthopedics;  Laterality: Right;   TEE WITHOUT CARDIOVERSION N/A 02/16/2021   Procedure: TRANSESOPHAGEAL ECHOCARDIOGRAM (TEE);  Surgeon: Elease Hashimoto Deloris Ping, MD;  Location: [facility] ENDOSCOPY;  Service: Cardiovascular;  Laterality: N/A;   TOTAL KNEE ARTHROPLASTY Right 03/21/2017   TOTAL KNEE ARTHROPLASTY Right 03/21/2017   Procedure: RIGHT TOTAL KNEE ARTHROPLASTY;  Surgeon: Valeria Batman, MD;  Location: [facility] OR;  Service: Orthopedics;  Laterality: Right;   TOTAL KNEE ARTHROPLASTY Left 11/29/2022   Procedure: LEFT TOTAL KNEE ARTHROPLASTY;  Surgeon: Cammy Copa, MD;  Location: [facility] ORS;  Service: Orthopedics;  Laterality: Left;   TUBAL LIGATION     Patient Active Problem List   Diagnosis Date Noted   Unintentional weight loss 03/24/2023   S/P total knee replacement, left 11/29/2022    Urinary incontinence 03/25/2022   ICH (intracerebral hemorrhage) (HCC) 02/13/2021   Iron deficiency 10/07/2020   Aortic atherosclerosis (HCC) 03/17/2020   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   Chronic left shoulder pain 08/23/2018   Arthritis of left knee 11/02/2016   Chronic diarrhea 12/07/2014   Segmental colitis with rectal bleeding (HCC) 09/12/2014   Routine general medical examination at a health care facility 06/05/2014   Overweight 06/13/2011   Hyperlipidemia 02/17/2007   Essential hypertension 01/08/2007   Osteoarthritis 01/08/2007   ONSET DATE: 11/30/22  REFERRING DIAG: Pain in R wrist; closed intra-articular fracture of distal end of right radius  THERAPY DIAG:  Muscle weakness (generalized)  Pain in right wrist  Stiffness of right wrist joint  Rationale for Evaluation and Treatment: Rehabilitation  SUBJECTIVE:  SUBJECTIVE STATEMENT: Pt reports she's been pushing herself to do even more this week with her hand, and she's feeling sore.   Pt accompanied by: self  PERTINENT HISTORY:  Per MEDICAL RECORD NUMBERNancy B Haley is a 73 y.o. female who presents to the office reporting decreasing pain from right wrist fracture.  She has seen another hand surgeon for evaluation of the right wrist and they have recommended occupational therapy which has been arranged.   After 2 negative xrays, MRI confirmed: Other closed intra-articular fracture of distal end of right radius, initial encounter (Primary Dx);  Additional PMH of: Primary osteoarthritis of right wrist;  Primary osteoarthritis of right hand  Carpal tunnel release B hands ~1.5 years ago Hx of CVA, treated in outpatient OT by this clinician  PRECAUTIONS: Other: ok to wear the wrist brace for comfort as needed  RED FLAGS: None   WEIGHT BEARING RESTRICTIONS: No  PAIN: 06/02/23: 3/10 R wrist  Are you having pain? Yes: NPRS scale: 3 at rest, with activity 6/10 Pain location: dorsal wrist and hand, radial  aspect Pain description: tight and a little sharp, a little achy Aggravating factors: weight bearing through the hand and movement Relieving factors: rest, Voltaren gel may help somewhat, OTC pain meds  FALLS: Has patient fallen in last 6 months? Yes. Number of falls 1 fall (cause of injury)  LIVING ENVIRONMENT: Lives with: lives with their spouse Lives in: 3 level home (stair lift to bonus room), pt gets on her gator (ATV) and goes around the house to access the basement Stairs: Yes: External: 2 steps; on right going up Has following equipment at home: Quad cane small base, shower chair, elevated toilet seat, grab bar at the toilet   PLOF: Independent  PATIENT GOALS: "To be able to use my R hand for my daily activities.  I'm R handed so I want to be as good as I can."   NEXT MD VISIT: Posey Rea  Going to have an MRI for low back d/t worsening numbness in the R Leg.    OBJECTIVE:  Note: Objective measures were completed at Evaluation unless otherwise noted.  HAND DOMINANCE: Right  ADLs: Overall ADLs: modified indep with basic self care prior to injury.  Spouse currently assisting with opening bottles.  Pt endorses difficulty washing under L arm pit d/t lack of wrist mobility, spouse assists with lifting, carrying heavier items in kitchen.  Difficulty with tasks that require bending, pulling, or pushing from the wrist.  Difficulty putting in earrings.   FUNCTIONAL OUTCOME MEASURES: FOTO: 43; predicted 60 05/25/23: 50  UPPER EXTREMITY ROM:     Active ROM Right eval Right 05/25/23 Left eval Left 05/25/23  Shoulder flexion      Shoulder abduction      Shoulder adduction      Shoulder extension      Shoulder internal rotation      Shoulder external rotation      Elbow flexion      Elbow extension      Wrist flexion 29 59 49 65  Wrist extension 39 50 58 65  Wrist ulnar deviation 29 35 54   Wrist radial deviation 14 23 31    Wrist pronation 84 90 88   Wrist supination 64 75 76    (Blank rows = not tested)  Active ROM Right eval Left eval  Thumb MCP (0-60)    Thumb IP (0-80)    Thumb Radial abd/add (0-55)     Thumb Palmar abd/add (0-45)     Thumb Opposition to Small Finger     Index MCP (0-90)     Index PIP (0-100)     Index DIP (0-70)      Long MCP (0-90)      Long PIP (0-100)      Long DIP (0-70)      Ring MCP (0-90)      Ring PIP (0-100)      Ring DIP (0-70)      Little MCP (0-90)      Little PIP (0-100)      Little DIP (0-70)      (Blank rows = not tested)   Full composite fist bilaterally, slightly painful on the R with full closure   Able to oppose all digits to thumb in bilat hands, some pain with R hand digit opposition    UPPER EXTREMITY MMT:     MMT Right eval Right 05/25/23 Left eval  Shoulder flexion     Shoulder abduction     Shoulder adduction     Shoulder extension     Shoulder internal rotation     Shoulder external rotation     Middle trapezius     Lower trapezius     Elbow flexion     Elbow extension     Wrist flexion 4- 4 4+  Wrist extension 4- 4 4+  Wrist ulnar deviation 4- 4 4+  Wrist radial deviation 4- 4 4+  Wrist pronation 4- 4 4+  Wrist supination 4- 4 4+  (Blank rows = not tested)  HAND FUNCTION: Grip strength: Right: 21 lbs; Left: 20 (L side hx of shoulder replacement and affected by CVA) lbs, Lateral pinch: Right: 11 lbs, Left: 8 lbs, and 3 point pinch: Right: 5 lbs, Left: 6 lbs 05/25/23:  Grip strength: Right: 25 lbs; Left: 25 lbs, Lateral pinch: Right: 10 lbs, Left: 9 lbs; 3 point point pinch: Right: 5 lbs, Left: 8 lbs  COORDINATION: 9 Hole Peg test: Right: 41 sec; Left: 43 sec  SENSATION: R hand WFL, L hand thumb and index finger mildly numb (but improved since carpal tunnel release)  EDEMA: very mild swelling in R dorsal wrist   COGNITION: Overall cognitive status: Within functional limits for tasks assessed  OBSERVATIONS:  Pt known to this OT from previous OT to tx LUE weakness post CVA in  2022.  Pt eager to increase function in R hand post wrist fx as she is R hand dominant.  Pt requests 1x per week d/t pt living in [place] which requires an extended drive to this Clinic.  Pt historically adherent to HEPs so anticipate that 1x per week should be acceptable.  TODAY'S TREATMENT:                                                                                                                              DATE: 06/02/23: Paraffin:  X10 min for R wrist/hand pain management/muscle relaxation in prep for therapeutic exercises.  Manual Therapy: -Performed soft tissue massage to R thumb, all sides of wrist and forearm with focus to radial side, working to decrease pain and increase tissue extensibility for improving joint range in the wrist and hand. -Performed grade 1,2, and 3  joint mobs to R wrist and forearm all planes, working to decrease pain and increase mobility to wrist and forearm joint ranges.   Therapeutic Exercise: -HEP review: self passive wrist extension stretching with supinated forearm (position needed for R dominant hand to perform peri hygiene) -Review of self passive R forearm supination stretch using L hand, and active supination via hammer stretch; good return demo with min vc for positioning   PATIENT EDUCATION: Education details: HEP progression  Person educated: Patient Education method: Programmer, multimedia, demo, Psychologist, counselling Education comprehension: verbalized understanding and returned demonstration  HOME EXERCISE PROGRAM: Light blue theraputty, self passive wrist and forearm ROM, AROM; 1# dumbbell for R wrist strengthening  GOALS: Goals reviewed with patient? Yes  SHORT TERM GOALS: Target date: 05/03/23  Pt will be indep to perform HEP for improving R wrist flexibility and strength for daily tasks. Baseline: Eval: Initiated theraputty and passive wrist flexibility exercises at eval; min vc with handouts; 05/25/23: consistent to perform and indep with  visual handouts Goal status: achieved  LONG TERM GOALS: Target date: 07/04/23  Pt will increase FOTO score to 60 or better to indicate improvement in self perceived functional use of the R hand with daily tasks. Baseline: Eval: 43; 05/25/23: 50 Goal status: ongoing  2.  Pt will increase R grip strength by 10 or more lbs to enable indep with opening jars/containers.  Baseline: Eval: R grip 21 lbs (spouse assists to open jars); 05/25/23: R grip 25 lbs Goal status: ongoing  3.  Pt will increase R wrist flex/ext/supination by 7-10* to enable sufficient mobility to engage R dominant hand during grooming and eating (revised to include use of R hand  for peri hygiene). Baseline: Eval: R wrist flex 29, ext 39, sup 64 (pt reports inability to put in earrings and difficulty bringing eating utensils to mouth); 05/25/23: R wrist flex 59, ext 50, sup 75 (pt can put in her earrings with extra time, manipulate eating utensils with R hand, but reports not yet able to manage peri hygiene with the R hand).   Goal status: ongoing  4.  Pt will increase R wrist strength by 1/2 or more muscle grades in order to use R dominant hand for refilling dog food and water dishes.  Baseline: Eval: R wrist strength grossly 4-; pt uses L non-dominant hand to refill dog food and water dishes; 05/25/23: R wrist strength grossly 4-/5; pt now able to refill dog food and water dishes with R hand. Goal status: achieved  ASSESSMENT: CLINICAL IMPRESSION:  Pt continues to increase use of R hand with daily tasks each week, though continues to report soreness as a result.  Pain has been steadily improving, however, and pt continues to respond well to moist heat, manual therapy, and paraffin tx for pain management and muscle relaxation, allowing increased tolerance to passive stretching.  Pt still lacking sufficient mobility to manage dominant hand posterior peri hygiene, though making good efforts to work towards this goal by making attempts  to wash posteriorly with wash cloth in shower.  Pt will continue to benefit from skilled OT to improve R wrist pain, R wrist mobility, and strength in order to increase functional use of the R dominant hand for daily tasks.   PERFORMANCE DEFICITS: in functional skills including ADLs, IADLs, coordination, sensation, edema, ROM, strength, pain, flexibility, Fine motor control, body mechanics, decreased knowledge of use of DME, and UE functional use, and psychosocial skills including coping strategies, environmental adaptation, habits, and routines and behaviors.   IMPAIRMENTS: are limiting patient from ADLs, IADLs, rest and sleep, and leisure.   COMORBIDITIES: has co-morbidities such as L sided hemiparesis, OA bilat hands  that affects occupational performance. Patient will benefit from skilled OT to address above impairments and improve overall function.  MODIFICATION OR ASSISTANCE TO COMPLETE EVALUATION: No modification of tasks or assist necessary to complete an evaluation.  OT OCCUPATIONAL PROFILE AND HISTORY: Problem focused assessment: Including review of records relating to presenting problem.  CLINICAL DECISION MAKING: Moderate - several treatment options, min-mod task modification necessary  REHAB POTENTIAL: Good  EVALUATION COMPLEXITY: Low      PLAN:  OT FREQUENCY: 1-2x/week  OT DURATION: 12 weeks  PLANNED INTERVENTIONS: 97168 OT Re-evaluation, 97535 self care/ADL training, 16109 therapeutic exercise, 97530 therapeutic activity, 97140 manual therapy, 97035 ultrasound, 97018 paraffin, 60454 moist heat, 97010 cryotherapy, 97034 contrast bath, 97033 iontophoresis, 97760 Orthotics management and training, 09811 Splinting (initial encounter), [id number] Subsequent splinting/medication, passive range of motion, psychosocial skills training, coping strategies training, patient/family education, and DME and/or AE instructions  RECOMMENDED OTHER SERVICES: None at this time  CONSULTED AND  AGREED WITH PLAN OF CARE: Patient  PLAN FOR NEXT SESSION: see above  Danelle Earthly, MS, OTR/L  Otis Dials, OT 06/02/2023, 7:40 PM

## 2023-06-02 NOTE — Telephone Encounter (Signed)
Copied from CRM [id number]. Topic: Clinical - Medication Refill >> Jun 02, 2023  1:00 PM Almira Coaster wrote: Most Recent Primary Care Visit:  Provider: Sue Lush  Department: [facility]  Visit Type: MEDICARE AWV, SEQUENTIAL  Date: 04/04/2023  Medication: atenolol-chlorthalidone (TENORETIC) 50-25 MG tablet, simvastatin (ZOCOR) 20 MG tablet & KLOR-CON M20 20 MEQ tablet  Has the patient contacted their pharmacy? Yes, pharmacy asked patient to call doctor's office (Agent: If no, request that the patient contact the pharmacy for the refill. If patient does not wish to contact the pharmacy document the reason why and proceed with request.) (Agent: If yes, when and what did the pharmacy advise?)  Is this the correct pharmacy for this prescription? Yes If no, delete pharmacy and type the correct one.  This is the patient's preferred pharmacy:    [facility] - [place], [place] - [address] Portal to Registered [employer] [address] [place] [address] Phone: [phone] Fax: [phone]   Has the prescription been filled recently? No  Is the patient out of the medication? No  Has the patient been seen for an appointment in the last year OR does the patient have an upcoming appointment? Yes  Can we respond through MyChart? Yes  Agent: Please be advised that Rx refills may take up to 3 business days. We ask that you follow-up with your pharmacy.

## 2023-06-05 ENCOUNTER — Telehealth: Payer: Self-pay

## 2023-06-05 MED ORDER — SIMVASTATIN 20 MG PO TABS: 20.0000 mg | ORAL_TABLET | Freq: Every day | ORAL | 1 refills | Status: DC

## 2023-06-05 MED ORDER — POTASSIUM CHLORIDE CRYS ER 20 MEQ PO TBCR: 20.0000 meq | EXTENDED_RELEASE_TABLET | Freq: Two times a day (BID) | ORAL | 1 refills | Status: DC

## 2023-06-05 MED ORDER — ATENOLOL-CHLORTHALIDONE 50-25 MG PO TABS: 1.0000 | ORAL_TABLET | Freq: Every day | ORAL | 1 refills | Status: DC

## 2023-06-05 NOTE — Telephone Encounter (Signed)
 Copied from CRM [id number]. Topic: Clinical - Prescription Issue >> Jun 05, 2023  2:28 PM Adele Barthel wrote: Reason for CRM:   Patient requested refills for several of her medications however, they were sent to the incorrect pharmacy. [facility] [address]   atenolol-chlorthalidone (TENORETIC) 50-25 MG tablet  potassium chloride SA (KLOR-CON M20) 20 MEQ tablet  simvastatin (ZOCOR) 20 MG tablet  Needs to be sent to:   [facility] [phone]

## 2023-06-07 ENCOUNTER — Ambulatory Visit: Payer: Medicare Other

## 2023-06-07 DIAGNOSIS — M6281 Muscle weakness (generalized): Secondary | ICD-10-CM | POA: Diagnosis not present

## 2023-06-07 DIAGNOSIS — M25531 Pain in right wrist: Secondary | ICD-10-CM

## 2023-06-07 DIAGNOSIS — R278 Other lack of coordination: Secondary | ICD-10-CM

## 2023-06-07 DIAGNOSIS — M25631 Stiffness of right wrist, not elsewhere classified: Secondary | ICD-10-CM

## 2023-06-07 NOTE — Therapy (Signed)
 OUTPATIENT OCCUPATIONAL THERAPY ORTHO DISCHARGE NOTE  Patient Name: [name] MRN: [id number] DOB:08/03/1950, 73 y.o., female Today's Date: 06/07/2023  PCP: Dr. Hillard Danker REFERRING PROVIDER: Dr. Rise Paganini  END OF SESSION:  OT End of Session - 06/07/23 1323     Visit Number 12    Number of Visits 12    Date for OT Re-Evaluation 06/14/23    Progress Note Due on Visit 10    OT Start Time 1315    OT Stop Time 1400    OT Time Calculation (min) 45 min    Activity Tolerance Patient tolerated treatment well    Behavior During Therapy WFL for tasks assessed/performed            Past Medical History:  Diagnosis Date   Arthritis    back, fingers with joint pain and swelling.  chronic back pain   Cataract    Chronic back pain    arthritis    Diverticulosis    Elevated cholesterol    takes Niacin daily and Simvastatin   GERD (gastroesophageal reflux disease) 06/2004   non-specific gastritis on EGD 06/2004   Headache(784.0)    occasionally   History of colon polyps 2004, 2009   2004:adenomatous. 2009 hyperplastic.    History of hiatal hernia    Hypertension    takes Tenoretic and Lisinopril daily   Mucoid cyst of joint 08/2013   right index finger   Neuromuscular disorder (HCC)    numbness in right leg after knee replacement   Osteopenia 01/2014   T score -1.1 FRAX 14%/0.5%. Stable from prior DEXA   PONV (postoperative nausea and vomiting)    Rotator cuff arthropathy    Left   Seasonal allergies    Sleep apnea    wears CPAP nightly   Stroke (HCC) 1998   x 2 - mild left-sided weakness   Urge incontinence    Uterine prolapse    Past Surgical History:  Procedure Laterality Date   BACK SURGERY     x 2   BELPHAROPTOSIS REPAIR Bilateral 12/14/2017   BUBBLE STUDY  02/16/2021   Procedure: BUBBLE STUDY;  Surgeon: Vesta Mixer, MD;  Location: [facility] ENDOSCOPY;  Service: Cardiovascular;;   CARPAL TUNNEL RELEASE Bilateral    Dr August Saucer   CATARACT  EXTRACTION     CATARACT EXTRACTION Bilateral 10/2017   COLONOSCOPY  2004, 2009, 2014   FINGER SURGERY     GUM SURGERY     GYNECOLOGIC CRYOSURGERY     HYSTEROSCOPY WITH D & C  12/07/2010   with resection of endometrial polyp   JOINT REPLACEMENT     KNEE ARTHROSCOPY Left 2008   lip biopsy     done at MD office Fri 11/22/13   MASS EXCISION Right 08/15/2013   Procedure: RIGHT INDEX EXCISION MASS ;  Surgeon: Tami Ribas, MD;  Location: [facility];  Service: Orthopedics;  Laterality: Right;   NASAL SEPTUM SURGERY     OOPHORECTOMY Right 2000   REVERSE SHOULDER ARTHROPLASTY Left 12/11/2018   REVERSE SHOULDER ARTHROPLASTY Left 12/11/2018   Procedure: LEFT REVERSE SHOULDER ARTHROPLASTY;  Surgeon: Cammy Copa, MD;  Location: [facility] OR;  Service: Orthopedics;  Laterality: Left;   SHOULDER ARTHROSCOPY WITH OPEN ROTATOR CUFF REPAIR AND DISTAL CLAVICLE ACROMINECTOMY Right 11/26/2013   Procedure: RIGHT SHOULDER ARTHROSCOPY WITH MINI OPEN ROTATOR CUFF REPAIR AND DISTAL CLAVICLE RESECTION, SUBACROMIAL DECOMPRESSION, POSSIBLE [facility] PATCH.;  Surgeon: Valeria Batman, MD;  Location: [facility] OR;  Service: Orthopedics;  Laterality: Right;   TEE WITHOUT CARDIOVERSION N/A 02/16/2021   Procedure: TRANSESOPHAGEAL ECHOCARDIOGRAM (TEE);  Surgeon: Elease Hashimoto Deloris Ping, MD;  Location: [facility] ENDOSCOPY;  Service: Cardiovascular;  Laterality: N/A;   TOTAL KNEE ARTHROPLASTY Right 03/21/2017   TOTAL KNEE ARTHROPLASTY Right 03/21/2017   Procedure: RIGHT TOTAL KNEE ARTHROPLASTY;  Surgeon: Valeria Batman, MD;  Location: [facility] OR;  Service: Orthopedics;  Laterality: Right;   TOTAL KNEE ARTHROPLASTY Left 11/29/2022   Procedure: LEFT TOTAL KNEE ARTHROPLASTY;  Surgeon: Cammy Copa, MD;  Location: [facility] ORS;  Service: Orthopedics;  Laterality: Left;   TUBAL LIGATION     Patient Active Problem List   Diagnosis Date Noted   Unintentional weight loss 03/24/2023   S/P total knee replacement, left 11/29/2022    Urinary incontinence 03/25/2022   ICH (intracerebral hemorrhage) (HCC) 02/13/2021   Iron deficiency 10/07/2020   Aortic atherosclerosis (HCC) 03/17/2020   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   Chronic left shoulder pain 08/23/2018   Arthritis of left knee 11/02/2016   Chronic diarrhea 12/07/2014   Segmental colitis with rectal bleeding (HCC) 09/12/2014   Routine general medical examination at a health care facility 06/05/2014   Overweight 06/13/2011   Hyperlipidemia 02/17/2007   Essential hypertension 01/08/2007   Osteoarthritis 01/08/2007   ONSET DATE: 11/30/22  REFERRING DIAG: Pain in R wrist; closed intra-articular fracture of distal end of right radius  THERAPY DIAG:  Muscle weakness (generalized)  Other lack of coordination  Pain in right wrist  Stiffness of right wrist joint  Rationale for Evaluation and Treatment: Rehabilitation  SUBJECTIVE:  SUBJECTIVE STATEMENT: "Today is a hurt day." (Pt reported doing some lifting with her dogs in/out of the car yesterday to get them to/from the vet) Pt accompanied by: self  PERTINENT HISTORY:  Per MEDICAL RECORD NUMBERNancy B Haley is a 73 y.o. female who presents to the office reporting decreasing pain from right wrist fracture.  She has seen another hand surgeon for evaluation of the right wrist and they have recommended occupational therapy which has been arranged.   After 2 negative xrays, MRI confirmed: Other closed intra-articular fracture of distal end of right radius, initial encounter (Primary Dx);  Additional PMH of: Primary osteoarthritis of right wrist;  Primary osteoarthritis of right hand  Carpal tunnel release B hands ~1.5 years ago Hx of CVA, treated in outpatient OT by this clinician  PRECAUTIONS: Other: ok to wear the wrist brace for comfort as needed  RED FLAGS: None   WEIGHT BEARING RESTRICTIONS: No  PAIN: 06/07/23: 4-5/10 R wrist Are you having pain? Yes: NPRS scale: 3 at rest, with activity  6/10 Pain location: dorsal wrist and hand, radial aspect Pain description: tight and a little sharp, a little achy Aggravating factors: weight bearing through the hand and movement Relieving factors: rest, Voltaren gel may help somewhat, OTC pain meds  FALLS: Has patient fallen in last 6 months? Yes. Number of falls 1 fall (cause of injury)  LIVING ENVIRONMENT: Lives with: lives with their spouse Lives in: 3 level home (stair lift to bonus room), pt gets on her gator (ATV) and goes around the house to access the basement Stairs: Yes: External: 2 steps; on right going up Has following equipment at home: Quad cane small base, shower chair, elevated toilet seat, grab bar at the toilet   PLOF: Independent  PATIENT GOALS: "To be able to use my R hand for my daily activities.  I'm R handed so I want to be as good as I  can."   NEXT MD VISIT: Posey Rea Going to have an MRI for low back d/t worsening numbness in the R Leg.    OBJECTIVE:  Note: Objective measures were completed at Evaluation unless otherwise noted.  HAND DOMINANCE: Right  ADLs: Overall ADLs: modified indep with basic self care prior to injury.  Spouse currently assisting with opening bottles.  Pt endorses difficulty washing under L arm pit d/t lack of wrist mobility, spouse assists with lifting, carrying heavier items in kitchen.  Difficulty with tasks that require bending, pulling, or pushing from the wrist.  Difficulty putting in earrings.   FUNCTIONAL OUTCOME MEASURES: FOTO: 43; predicted 60 05/25/23: 50 06/07/23: 57  UPPER EXTREMITY ROM:     Active ROM Right eval Right 05/25/23 Right 06/07/23 Left eval Left 05/25/23  Shoulder flexion       Shoulder abduction       Shoulder adduction       Shoulder extension       Shoulder internal rotation       Shoulder external rotation       Elbow flexion       Elbow extension       Wrist flexion 29 59 61 49 65  Wrist extension 39 50 59 58 65  Wrist ulnar deviation 29 35 35  54   Wrist radial deviation 14 23 34 31   Wrist pronation 84 90 90 88   Wrist supination 64 75 81 76   (Blank rows = not tested)  Active ROM Right eval Left eval  Thumb MCP (0-60)    Thumb IP (0-80)    Thumb Radial abd/add (0-55)     Thumb Palmar abd/add (0-45)     Thumb Opposition to Small Finger     Index MCP (0-90)     Index PIP (0-100)     Index DIP (0-70)      Long MCP (0-90)      Long PIP (0-100)      Long DIP (0-70)      Ring MCP (0-90)      Ring PIP (0-100)      Ring DIP (0-70)      Little MCP (0-90)      Little PIP (0-100)      Little DIP (0-70)      (Blank rows = not tested)   Full composite fist bilaterally, slightly painful on the R with full closure   Able to oppose all digits to thumb in bilat hands, some pain with R hand digit opposition    UPPER EXTREMITY MMT:     MMT Right eval Right 05/25/23 Right 06/07/23 Left eval  Shoulder flexion      Shoulder abduction      Shoulder adduction      Shoulder extension      Shoulder internal rotation      Shoulder external rotation      Middle trapezius      Lower trapezius      Elbow flexion      Elbow extension      Wrist flexion 4- 4 4+ 4+  Wrist extension 4- 4 4+ 4+  Wrist ulnar deviation 4- 4 4+ 4+  Wrist radial deviation 4- 4 4+ 4+  Wrist pronation 4- 4 4+ 4+  Wrist supination 4- 4 4+ 4+  (Blank rows = not tested)  HAND FUNCTION: Grip strength: Right: 21 lbs; Left: 20 (L side hx of shoulder replacement and affected by CVA) lbs, Lateral pinch: Right:  11 lbs, Left: 8 lbs, and 3 point pinch: Right: 5 lbs, Left: 6 lbs 05/25/23:  Grip strength: Right: 25 lbs; Left: 25 lbs, Lateral pinch: Right: 10 lbs, Left: 9 lbs; 3 point point pinch: Right: 5 lbs, Left: 8 lbs 06/07/23:  Grip strength: Right: 26 lbs; Left: 25 lbs, Lateral pinch: Right: 10 lbs, Left: 9 lbs; 3 point point pinch: Right: 8 lbs, Left: 8 lbs  COORDINATION: 9 Hole Peg test: Right: 41 sec; Left: 43 sec  SENSATION: R hand WFL, L hand thumb and  index finger mildly numb (but improved since carpal tunnel release)  EDEMA: very mild swelling in R dorsal wrist   COGNITION: Overall cognitive status: Within functional limits for tasks assessed  OBSERVATIONS:  Pt known to this OT from previous OT to tx LUE weakness post CVA in 2022.  Pt eager to increase function in R hand post wrist fx as she is R hand dominant.  Pt requests 1x per week d/t pt living in [place] which requires an extended drive to this Clinic.  Pt historically adherent to HEPs so anticipate that 1x per week should be acceptable.  TODAY'S TREATMENT:                                                                                                                              DATE: 06/07/23: Paraffin:  X10 min for R wrist/hand pain management/muscle relaxation in prep for therapeutic exercises.  Therapeutic Activity: Objective measures taken and goals updated and reviewed for discharge summary.   Manual Therapy: -Performed soft tissue massage to R thumb, all sides of wrist and forearm with focus to radial side, working to decrease pain and increase tissue extensibility for improving joint range in the wrist and hand.   Therapeutic Exercise: -HEP review: self passive wrist extension stretching with supinated forearm (position needed for R dominant hand to perform peri hygiene) -Review of self passive R forearm supination stretch using L hand, and active supination via hammer stretch   PATIENT EDUCATION: Education details: HEP progression, progress towards goals Person educated: Patient Education method: Explanation Education comprehension: verbalized understanding  HOME EXERCISE PROGRAM: Light blue theraputty, self passive wrist and forearm ROM, AROM; 1# dumbbell for R wrist strengthening  GOALS: Goals reviewed with patient? Yes  SHORT TERM GOALS: Target date: 05/03/23  Pt will be indep to perform HEP for improving R wrist flexibility and strength for daily  tasks. Baseline: Eval: Initiated theraputty and passive wrist flexibility exercises at eval; min vc with handouts; 05/25/23: consistent to perform and indep with visual handouts Goal status: achieved  LONG TERM GOALS: Target date: 07/04/23  Pt will increase FOTO score to 60 or better to indicate improvement in self perceived functional use of the R hand with daily tasks. Baseline: Eval: 43; 05/25/23: 50; 06/07/23: 57 Goal status: sufficiently met  2.  Pt will increase R grip strength by 10 or more lbs to enable indep with opening jars/containers.  Baseline:  Eval: R grip 21 lbs (spouse assists to open jars); 05/25/23: R grip 25 lbs; 06/07/23: R grip 26 lbs Goal status: partially met (pt will continue with theraputty exercises for R grip strengthening)  3.  Pt will increase R wrist flex/ext/supination by 7-10* to enable sufficient mobility to engage R dominant hand during grooming and eating (revised to include use of R hand for peri hygiene). Baseline: Eval: R wrist flex 29, ext 39, sup 64 (pt reports inability to put in earrings and difficulty bringing eating utensils to mouth); 05/25/23: R wrist flex 59, ext 50, sup 75 (pt can put in her earrings with extra time, manipulate eating utensils with R hand, but reports not yet able to manage peri hygiene with the R hand); 06/07/23: R wrist flex 61, ext 59, sup 81 (pt can groom and eat with R hand, but not yet able to manage peri care with the R hand.  Pt is indep with exercises to continue to work toward meeting this goal).  Goal status: improving/partially met  4.  Pt will increase R wrist strength by 1/2 or more muscle grades in order to use R dominant hand for refilling dog food and water dishes.  Baseline: Eval: R wrist strength grossly 4-; pt uses L non-dominant hand to refill dog food and water dishes; 05/25/23: R wrist strength grossly 4-/5; pt now able to refill dog food and water dishes with R hand; 06/07/23: R wrist strength grossly 4+ all planes   Goal status: achieved  ASSESSMENT: CLINICAL IMPRESSION:  Pt seen for 12 OT visits to address functional deficits in the RUE post R closed intra-articular distal radius fx of dominant hand.  Pt has either achieved or improved upon all OT goals, noting sufficient gains in strength and flexibility of R forearm, wrist, and hand.  Pt continues to have min-moderate pain in wrist with activity, though with pt's arthritis hx in the hands, anticipate pain fluctuations.  Overall, pain has improved, and is well managed with rest, heat, stretching, and activity modification and pt is now using the R hand with all daily tasks.  Discharge appropriate this date as pt is indep and consistent with HEP.  Pt is in agreement with plan.  PERFORMANCE DEFICITS: in functional skills including ADLs, IADLs, coordination, sensation, edema, ROM, strength, pain, flexibility, Fine motor control, body mechanics, decreased knowledge of use of DME, and UE functional use, and psychosocial skills including coping strategies, environmental adaptation, habits, and routines and behaviors.   IMPAIRMENTS: are limiting patient from ADLs, IADLs, rest and sleep, and leisure.   COMORBIDITIES: has co-morbidities such as L sided hemiparesis, OA bilat hands  that affects occupational performance. Patient will benefit from skilled OT to address above impairments and improve overall function.  MODIFICATION OR ASSISTANCE TO COMPLETE EVALUATION: No modification of tasks or assist necessary to complete an evaluation.  OT OCCUPATIONAL PROFILE AND HISTORY: Problem focused assessment: Including review of records relating to presenting problem.  CLINICAL DECISION MAKING: Moderate - several treatment options, min-mod task modification necessary  REHAB POTENTIAL: Good  EVALUATION COMPLEXITY: Low      PLAN:  OT FREQUENCY: 1-2x/week  OT DURATION: 12 weeks  PLANNED INTERVENTIONS: 97168 OT Re-evaluation, 97535 self care/ADL training, 15176  therapeutic exercise, 97530 therapeutic activity, 97140 manual therapy, 97035 ultrasound, 97018 paraffin, 16073 moist heat, 97010 cryotherapy, 97034 contrast bath, 97033 iontophoresis, 97760 Orthotics management and training, 71062 Splinting (initial encounter), [id number] Subsequent splinting/medication, passive range of motion, psychosocial skills training, coping strategies training, patient/family education, and  DME and/or AE instructions  RECOMMENDED OTHER SERVICES: None at this time  CONSULTED AND AGREED WITH PLAN OF CARE: Patient  PLAN FOR NEXT SESSION: N/A; d/c this date  Danelle Earthly, MS, OTR/L  Otis Dials, OT 06/07/2023, 2:15 PM

## 2023-06-08 ENCOUNTER — Other Ambulatory Visit: Payer: Self-pay

## 2023-06-08 MED ORDER — POTASSIUM CHLORIDE CRYS ER 20 MEQ PO TBCR: 20.0000 meq | EXTENDED_RELEASE_TABLET | Freq: Two times a day (BID) | ORAL | 1 refills | Status: DC

## 2023-06-08 MED ORDER — ATENOLOL-CHLORTHALIDONE 50-25 MG PO TABS: 1.0000 | ORAL_TABLET | Freq: Every day | ORAL | 1 refills | Status: DC

## 2023-06-08 MED ORDER — SIMVASTATIN 20 MG PO TABS: 20.0000 mg | ORAL_TABLET | Freq: Every day | ORAL | 1 refills | Status: DC

## 2023-06-08 NOTE — Telephone Encounter (Signed)
 Done

## 2023-07-24 ENCOUNTER — Inpatient Hospital Stay (HOSPITAL_BASED_OUTPATIENT_CLINIC_OR_DEPARTMENT_OTHER): Payer: Medicare Other | Admitting: Nurse Practitioner

## 2023-07-24 ENCOUNTER — Encounter: Payer: Self-pay | Admitting: Nurse Practitioner

## 2023-07-24 ENCOUNTER — Inpatient Hospital Stay: Payer: Medicare Other | Attending: Oncology

## 2023-07-24 VITALS — BP 115/80 | HR 74 | Temp 97.2°F | Resp 16 | Wt 163.0 lb

## 2023-07-24 DIAGNOSIS — Z79899 Other long term (current) drug therapy: Secondary | ICD-10-CM | POA: Diagnosis not present

## 2023-07-24 DIAGNOSIS — D509 Iron deficiency anemia, unspecified: Secondary | ICD-10-CM | POA: Diagnosis not present

## 2023-07-24 DIAGNOSIS — E611 Iron deficiency: Secondary | ICD-10-CM | POA: Diagnosis not present

## 2023-07-24 LAB — CBC WITH DIFFERENTIAL (CANCER CENTER ONLY)
Abs Immature Granulocytes: 0.02 10*3/uL (ref 0.00–0.07)
Basophils Absolute: 0.1 10*3/uL (ref 0.0–0.1)
Basophils Relative: 1 %
Eosinophils Absolute: 0.5 10*3/uL (ref 0.0–0.5)
Eosinophils Relative: 7 %
HCT: 43.9 % (ref 36.0–46.0)
Hemoglobin: 14.6 g/dL (ref 12.0–15.0)
Immature Granulocytes: 0 %
Lymphocytes Relative: 32 %
Lymphs Abs: 2.4 10*3/uL (ref 0.7–4.0)
MCH: 28.6 pg (ref 26.0–34.0)
MCHC: 33.3 g/dL (ref 30.0–36.0)
MCV: 86.1 fL (ref 80.0–100.0)
Monocytes Absolute: 0.5 10*3/uL (ref 0.1–1.0)
Monocytes Relative: 7 %
Neutro Abs: 3.9 10*3/uL (ref 1.7–7.7)
Neutrophils Relative %: 53 %
Platelet Count: 199 10*3/uL (ref 150–400)
RBC: 5.1 MIL/uL (ref 3.87–5.11)
RDW: 17 % — ABNORMAL HIGH (ref 11.5–15.5)
WBC Count: 7.4 10*3/uL (ref 4.0–10.5)
nRBC: 0 % (ref 0.0–0.2)

## 2023-07-24 LAB — FERRITIN: Ferritin: 70 ng/mL (ref 11–307)

## 2023-07-24 LAB — IRON AND TIBC
Iron: 84 ug/dL (ref 28–170)
Saturation Ratios: 24 % (ref 10.4–31.8)
TIBC: 351 ug/dL (ref 250–450)
UIBC: 267 ug/dL

## 2023-07-24 NOTE — Progress Notes (Signed)
 Hematology/Oncology Consult Note [facility] Telephone:([phone] [phone]  Patient Care Team: Adelia Homestead, MD as PCP - General (Internal Medicine) Sheryle Donning, MD as PCP - Cardiology (Cardiology) [facility], [facility], [place]   Name of the patient: [name]  [id number]  Jun 29, 1950   Reason for referral-iron deficiency anemia   Referring physician- Bambi Lever, MD  Date of visit: 07/24/23  History of presenting illness- Patient is a 73 year old female with a past medical history significant for hypertension hyperlipidemia osteoarthritis who has been referred for iron deficiency anemia.  CBC from 03/24/2023 showed white count of 8.3, H&H of 10.9/25.7 with an MCV of 73 B12 level is normal at 837.  TSH normal.  Iron studies from January 2025 showed low ferritin of 14 with an iron saturation of 6% and elevated TIBC.  She underwent left knee surgery in August 2024.  At baseline her hemoglobin usually runs between 12.5-14.  She has had evidence of chronic iron deficiency anemia even dating back to 3 years ago.  Colonoscopy was done in November 2021 by Dr. Sandrea Cruel which showed diverticulosis but no evidence of bleeding.  Random biopsies were negative for malignancy.  EGD showed mild nonobstructive stenosis at the GE junction which was traversed.  Small hiatal hernia.  Biopsies were also taken for celiac disease which were negative.  She was found to have lymphocytic colitis for which she was prescribed oral steroids.  Presently patient reports changes in her taste sensation and significant fatigue.  She denies any blood loss in her stool or urine.  Denies any nosebleeds gum bleeds or vaginal bleeding.  Interval History: [name] is a 73 y.o. female who returns to clinic for follow up for her iron deficiency anemia. She has now received venofer. Tolerated well. Energy has improved somewhat. Leg cramping has improved.    ECOG PS- 1  Pain scale- 0  Review of systems- Review of Systems  Constitutional:  Positive for malaise/fatigue. Negative for fever and weight loss.  HENT:  Negative for congestion, ear discharge and nosebleeds.   Eyes:  Negative for blurred vision.  Respiratory:  Negative for cough, hemoptysis, sputum production, shortness of breath and wheezing.   Cardiovascular:  Negative for chest pain, palpitations, orthopnea and claudication.  Gastrointestinal:  Negative for abdominal pain, blood in stool, constipation, diarrhea, heartburn, melena, nausea and vomiting.  Genitourinary:  Negative for dysuria, flank pain, frequency, hematuria and urgency.  Musculoskeletal:  Negative for back pain, joint pain and myalgias.  Skin:  Negative for rash.  Neurological:  Negative for dizziness, tingling, focal weakness, seizures, weakness and headaches.  Endo/Heme/Allergies:  Does not bruise/bleed easily.  Psychiatric/Behavioral:  Negative for depression and suicidal ideas. The patient does not have insomnia.     Allergies  Allergen Reactions   Egg-Derived Products Other (See Comments)    GI UPSET, JOINT PAIN, DIFF. SWALLOWING   Etodolac Other (See Comments)    ABD. CRAMPING   Fish-Derived Products Other (See Comments)    GI UPSET, JOINT PAIN, DIFF. SWALLOWING    Omeprazole Other (See Comments)    ABD. CRAMPING   Pineapple Other (See Comments)    GI UPSET, JOINT PAIN, DIFF. SWALLOWING   Gluten Meal Diarrhea    GI upset    Patient Active Problem List   Diagnosis Date Noted   Unintentional weight loss 03/24/2023   S/P total knee replacement, left 11/29/2022   Urinary incontinence 03/25/2022   ICH (intracerebral hemorrhage) (HCC) 02/13/2021  Iron deficiency 10/07/2020   Aortic atherosclerosis (HCC) 03/17/2020   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   Chronic left shoulder pain 08/23/2018   Arthritis of left knee 11/02/2016   Chronic diarrhea 12/07/2014   Segmental colitis with rectal  bleeding (HCC) 09/12/2014   Routine general medical examination at a health care facility 06/05/2014   Overweight 06/13/2011   Hyperlipidemia 02/17/2007   Essential hypertension 01/08/2007   Osteoarthritis 01/08/2007   Past Medical History:  Diagnosis Date   Arthritis    back, fingers with joint pain and swelling.  chronic back pain   Cataract    Chronic back pain    arthritis    Diverticulosis    Elevated cholesterol    takes Niacin daily and Simvastatin   GERD (gastroesophageal reflux disease) 06/2004   non-specific gastritis on EGD 06/2004   Headache(784.0)    occasionally   History of colon polyps 2004, 2009   2004:adenomatous. 2009 hyperplastic.    History of hiatal hernia    Hypertension    takes Tenoretic and Lisinopril daily   Mucoid cyst of joint 08/2013   right index finger   Neuromuscular disorder (HCC)    numbness in right leg after knee replacement   Osteopenia 01/2014   T score -1.1 FRAX 14%/0.5%. Stable from prior DEXA   PONV (postoperative nausea and vomiting)    Rotator cuff arthropathy    Left   Seasonal allergies    Sleep apnea    wears CPAP nightly   Stroke (HCC) 1998   x 2 - mild left-sided weakness   Urge incontinence    Uterine prolapse    Past Surgical History:  Procedure Laterality Date   BACK SURGERY     x 2   BELPHAROPTOSIS REPAIR Bilateral 12/14/2017   BUBBLE STUDY  02/16/2021   Procedure: BUBBLE STUDY;  Surgeon: Vesta Mixer, MD;  Location: [facility] ENDOSCOPY;  Service: Cardiovascular;;   CARPAL TUNNEL RELEASE Bilateral    Dr August Saucer   CATARACT EXTRACTION     CATARACT EXTRACTION Bilateral 10/2017   COLONOSCOPY  2004, 2009, 2014   FINGER SURGERY     GUM SURGERY     GYNECOLOGIC CRYOSURGERY     HYSTEROSCOPY WITH D & C  12/07/2010   with resection of endometrial polyp   JOINT REPLACEMENT     KNEE ARTHROSCOPY Left 2008   lip biopsy     done at MD office Fri 11/22/13   MASS EXCISION Right 08/15/2013   Procedure: RIGHT INDEX EXCISION  MASS ;  Surgeon: Tami Ribas, MD;  Location: [facility];  Service: Orthopedics;  Laterality: Right;   NASAL SEPTUM SURGERY     OOPHORECTOMY Right 2000   REVERSE SHOULDER ARTHROPLASTY Left 12/11/2018   REVERSE SHOULDER ARTHROPLASTY Left 12/11/2018   Procedure: LEFT REVERSE SHOULDER ARTHROPLASTY;  Surgeon: Cammy Copa, MD;  Location: [facility] OR;  Service: Orthopedics;  Laterality: Left;   SHOULDER ARTHROSCOPY WITH OPEN ROTATOR CUFF REPAIR AND DISTAL CLAVICLE ACROMINECTOMY Right 11/26/2013   Procedure: RIGHT SHOULDER ARTHROSCOPY WITH MINI OPEN ROTATOR CUFF REPAIR AND DISTAL CLAVICLE RESECTION, SUBACROMIAL DECOMPRESSION, POSSIBLE [facility] PATCH.;  Surgeon: Valeria Batman, MD;  Location: [facility] OR;  Service: Orthopedics;  Laterality: Right;   TEE WITHOUT CARDIOVERSION N/A 02/16/2021   Procedure: TRANSESOPHAGEAL ECHOCARDIOGRAM (TEE);  Surgeon: Elease Hashimoto Deloris Ping, MD;  Location: [facility] ENDOSCOPY;  Service: Cardiovascular;  Laterality: N/A;   TOTAL KNEE ARTHROPLASTY Right 03/21/2017   TOTAL KNEE ARTHROPLASTY Right 03/21/2017   Procedure: RIGHT TOTAL  KNEE ARTHROPLASTY;  Surgeon: Shirlee Dotter, MD;  Location: [facility] OR;  Service: Orthopedics;  Laterality: Right;   TOTAL KNEE ARTHROPLASTY Left 11/29/2022   Procedure: LEFT TOTAL KNEE ARTHROPLASTY;  Surgeon: Jasmine Mesi, MD;  Location: [facility] ORS;  Service: Orthopedics;  Laterality: Left;   TUBAL LIGATION     Social History   Socioeconomic History   Marital status: Married    Spouse name: Not on file   Number of children: 2   Years of education: 12   Highest education level: Not on file  Occupational History   Occupation: Forensic scientist - Human Resources-Retired    Comment: Disability/Retired  Tobacco Use   Smoking status: Never    Passive exposure: Yes   Smokeless tobacco: Never  Vaping Use   Vaping status: Never Used  Substance and Sexual Activity   Alcohol use: Not Currently    Alcohol/week: 0.0 standard drinks of alcohol     Comment: about 2-3 times a year   Drug use: No   Sexual activity: Yes    Partners: Male    Birth control/protection: Surgical, Post-menopausal    Comment: BTL-1st intercourse 73 yo-More than 5 partners  Other Topics Concern   Not on file  Social History Narrative   Blen grew up in [place], [place]. She lives with her husband Anette Kehr). She has 1 daughter Lovett Ruck), 1 son Gladis Lame) and 2 stepchildren (Benny & Hoy Mackintosh). They have 3 dogs ([name], Brandie, [name]) and 3 cats (Haughton, Franklin, Shiloh). She enjoys watching movies and outdoors activities.Exercise - noneCaffeine - varies depending on the day. May have between 1-4 cups daily.  Rare Soda.    Social Drivers of Corporate investment banker Strain: Low Risk  (04/04/2023)   Overall Financial Resource Strain (CARDIA)    Difficulty of Paying Living Expenses: Not hard at all  Food Insecurity: No Food Insecurity (05/01/2023)   Hunger Vital Sign    Worried About Running Out of Food in the Last Year: Never true    Ran Out of Food in the Last Year: Never true  Transportation Needs: No Transportation Needs (05/01/2023)   PRAPARE - Administrator, Civil Service (Medical): No    Lack of Transportation (Non-Medical): No  Physical Activity: Inactive (04/04/2023)   Exercise Vital Sign    Days of Exercise per Week: 0 days    Minutes of Exercise per Session: 0 min  Stress: No Stress Concern Present (04/04/2023)   [employer] of Occupational Health - Occupational Stress Questionnaire    Feeling of Stress : Not at all  Social Connections: Moderately Isolated (04/04/2023)   Social Connection and Isolation Panel [NHANES]    Frequency of Communication with Friends and Family: More than three times a week    Frequency of Social Gatherings with Friends and Family: Three times a week    Attends Religious Services: Never    Active Member of Clubs or Organizations: No    Attends Banker Meetings: Never    Marital Status: Married   Catering manager Violence: Not At Risk (04/04/2023)   Humiliation, Afraid, Rape, and Kick questionnaire    Fear of Current or Ex-Partner: No    Emotionally Abused: No    Physically Abused: No    Sexually Abused: No    Family History  Problem Relation Age of Onset   Hypertension Mother    Heart disease Mother    Diabetes Father    Hypertension Father    Heart disease Father  Stroke Father    Diabetes Maternal Aunt    Diabetes Paternal Grandmother    Hypertension Paternal Grandfather    Stroke Paternal Grandfather    Colon cancer Neg Hx    Esophageal cancer Neg Hx    Rectal cancer Neg Hx    Stomach cancer Neg Hx    Sleep apnea Neg Hx    Current Outpatient Medications:    acetaminophen (TYLENOL) 500 MG tablet, Take 1,000-1,500 mg by mouth every 8 (eight) hours as needed for moderate pain., Disp: , Rfl:    ALPHA LIPOIC ACID PO, Take 1,400 mg by mouth daily., Disp: , Rfl:    amoxicillin (AMOXIL) 500 MG capsule, 2g 1 hour prior to dental procedure, Disp: 10 capsule, Rfl: 0   Ascorbic Acid (VITAMIN C) 1000 MG tablet, Take 1,000 mg by mouth daily. With zinc, Disp: , Rfl:    aspirin 81 MG chewable tablet, Chew 1 tablet (81 mg total) by mouth 2 (two) times daily., Disp: 60 tablet, Rfl: 0   atenolol-chlorthalidone (TENORETIC) 50-25 MG tablet, Take 1 tablet by mouth daily., Disp: 90 tablet, Rfl: 1   b complex vitamins tablet, Take 1 tablet by mouth at bedtime., Disp: , Rfl:    calcium carbonate (OSCAL) 1500 (600 Ca) MG TABS tablet, Take 600 mg of elemental calcium by mouth daily., Disp: , Rfl:    Cholecalciferol (D 5000) 5000 units capsule, Take 5,000 Units by mouth daily., Disp: , Rfl:    Coenzyme Q10 (COQ10) 100 MG CAPS, Take 100 mg by mouth every evening. , Disp: , Rfl:    diclofenac Sodium (VOLTAREN) 1 % GEL, Apply 1 Application topically 2 (two) times daily., Disp: , Rfl:    ELDERBERRY PO, Take 250 mg by mouth daily., Disp: , Rfl:    folic acid (FOLVITE) 800 MCG tablet, Take 800  mcg by mouth every evening. , Disp: , Rfl:    Glutathione 500 MG CAPS, Take 500 mg by mouth daily., Disp: , Rfl:    ketotifen (ZADITOR) 0.025 % ophthalmic solution, Place 2 drops into both eyes 2 (two) times daily as needed (allergies)., Disp: , Rfl:    Melatonin 3 MG TABS, Take 3 mg by mouth at bedtime. , Disp: , Rfl:    methocarbamol (ROBAXIN) 500 MG tablet, TAKE 1 TABLET BY MOUTH EVERY 8 HOURS AS NEEDED FOR MUSCLE SPASMS, Disp: 90 tablet, Rfl: 3   Multiple Vitamin (MULTIVITAMIN WITH MINERALS) TABS tablet, Take 1 tablet by mouth at bedtime., Disp: , Rfl:    Multiple Vitamins-Minerals (ZINC PO), Take 1 Dose by mouth daily. 1 dose = 1 dropper full of liquid zinc, Disp: , Rfl:    OVER THE COUNTER MEDICATION, Take 1 Dose by mouth at bedtime. Lemon Balm, Disp: , Rfl:    OVER THE COUNTER MEDICATION, Take 1 Dose by mouth at bedtime. Cats Claw liquid daily, Disp: , Rfl:    potassium chloride SA (KLOR-CON M20) 20 MEQ tablet, Take 1 tablet (20 mEq total) by mouth 2 (two) times daily., Disp: 180 tablet, Rfl: 1   saccharomyces boulardii (FLORASTOR) 250 MG capsule, Take 250 mg by mouth daily., Disp: , Rfl:    simvastatin (ZOCOR) 20 MG tablet, Take 1 tablet (20 mg total) by mouth daily., Disp: 90 tablet, Rfl: 1   TURMERIC CURCUMIN PO, Take 630 mg by mouth daily., Disp: , Rfl:    budesonide (ENTOCORT EC) 3 MG 24 hr capsule, TAKE 3 CAPSULES (9 MG TOTAL) BY MOUTH DAILY. (Patient not taking: Reported on 07/24/2023),  Disp: , Rfl:    diphenoxylate-atropine (LOMOTIL) 2.5-0.025 MG tablet, TAKE 1 TABLET BY MOUTH FOUR TIMES A DAY AS NEEDED FOR DIARRHEA OR LOOSE STOOLS (Patient not taking: No sig reported), Disp: 80 tablet, Rfl: 2   loperamide (IMODIUM A-D) 2 MG tablet, Take 2-4 mg by mouth 4 (four) times daily as needed for diarrhea or loose stools. (Patient not taking: Reported on 05/01/2023), Disp: , Rfl:    METAMUCIL FIBER PO, Take 2 tablets by mouth daily as needed (constipation). (Patient not taking: Reported on  05/01/2023), Disp: , Rfl:    OVER THE COUNTER MEDICATION, Apply 1 Application topically at bedtime. Magnesium lotion (Patient not taking: Reported on 05/01/2023), Disp: , Rfl:    Sodium Fluoride (SODIUM FLUORIDE 5000 PPM) 1.1 % PSTE, Place 1 application onto teeth at bedtime. (Patient not taking: Reported on 05/01/2023), Disp: , Rfl:    traZODone (DESYREL) 50 MG tablet, Take 1 tablet (50 mg total) by mouth at bedtime. (Patient not taking: Reported on 07/24/2023), Disp: 25 tablet, Rfl: 0  Physical exam:  Vitals:   07/24/23 1353  BP: 115/80  Pulse: 74  Resp: 16  Temp: (!) 97.2 F (36.2 C)  TempSrc: Tympanic  SpO2: 98%  Weight: 163 lb (73.9 kg)   Physical Exam Vitals reviewed.  Constitutional:      Appearance: She is not ill-appearing.  Pulmonary:     Effort: Pulmonary effort is normal.  Abdominal:     General: There is no distension.  Musculoskeletal:        General: No deformity.  Skin:    General: Skin is warm and dry.     Coloration: Skin is not pale.  Neurological:     Mental Status: She is alert and oriented to person, place, and time.  Psychiatric:        Mood and Affect: Mood normal.        Behavior: Behavior normal.       Latest Ref Rng & Units 07/24/2023    1:45 PM 03/24/2023   11:24 AM 11/11/2022    1:30 PM  CBC  WBC 4.0 - 10.5 K/uL 7.4  8.3  7.8   Hemoglobin 12.0 - 15.0 g/dL 16.1  09.6  04.5   Hematocrit 36.0 - 46.0 % 43.9  35.7  36.9   Platelets 150 - 400 K/uL 199  305.0  264       Latest Ref Rng & Units 11/11/2022    1:30 PM 03/25/2022   11:20 AM 06/16/2021    2:14 PM  CMP  Glucose 70 - 99 mg/dL 93  88  76   BUN 8 - 23 mg/dL 20  16  16    Creatinine 0.44 - 1.00 mg/dL 4.09  8.11  9.14   Sodium 135 - 145 mmol/L 139  142  142   Potassium 3.5 - 5.1 mmol/L 3.6  3.7  3.8   Chloride 98 - 111 mmol/L 101  104  103   CO2 22 - 32 mmol/L 26  31  29    Calcium 8.9 - 10.3 mg/dL 9.6  9.4  78.2   Total Protein 6.0 - 8.3 g/dL  6.5  7.1   Total Bilirubin 0.2 - 1.2 mg/dL   0.5  0.5   Alkaline Phos 39 - 117 U/L  49  55   AST 0 - 37 U/L  33  30   ALT 0 - 35 U/L  24  18     Iron/TIBC/Ferritin/ %Sat    Component Value  Date/Time   IRON 45 07/01/2020 1112   FERRITIN 10.6 10/05/2020 1502   IRONPCTSAT 9.8 (L) 07/01/2020 1112     Assessment and plan- Patient is a 73 y.o. female   Iron deficient anemia- Hmg 10.17 November 2022. January 2025 ferritin 14, iron sat 6%. Baseline hemoglobin 12.5-14. Now s/p venofer x 5. Tolerated well. Hmg has normalized to 14.6. Symptoms have improved as well. Continue oral iron for now. Ferritin and iron studies are pending. Hold IV Iron. We will hold off on GI referral at this time but of iron deficiency anemia recurs I will refer her to GI at that time.  Disposition:  3 mo- lab (cbc, ferritin, iron studies), Dr Randy Buttery- la   Visit Diagnosis 1. Iron deficiency    Kenney Peacemaker, DNP, AGNP-C, [facility] [phone] (clinic) 07/24/2023

## 2023-07-26 ENCOUNTER — Ambulatory Visit: Payer: Medicare Other | Admitting: Oncology

## 2023-07-26 ENCOUNTER — Other Ambulatory Visit: Payer: Medicare Other

## 2023-08-16 ENCOUNTER — Encounter: Payer: Self-pay | Admitting: Oncology

## 2023-08-22 ENCOUNTER — Encounter: Payer: Self-pay | Admitting: Oncology

## 2023-08-23 ENCOUNTER — Encounter: Payer: Self-pay | Admitting: Oncology

## 2023-08-23 ENCOUNTER — Encounter: Payer: Self-pay | Admitting: Obstetrics and Gynecology

## 2023-08-23 ENCOUNTER — Ambulatory Visit: Payer: Self-pay | Admitting: Obstetrics and Gynecology

## 2023-08-23 ENCOUNTER — Ambulatory Visit: Admitting: Obstetrics and Gynecology

## 2023-08-23 VITALS — BP 118/76 | HR 76 | Ht 61.25 in | Wt 166.0 lb

## 2023-08-23 DIAGNOSIS — R32 Unspecified urinary incontinence: Secondary | ICD-10-CM

## 2023-08-23 DIAGNOSIS — M810 Age-related osteoporosis without current pathological fracture: Secondary | ICD-10-CM

## 2023-08-23 DIAGNOSIS — M8000XP Age-related osteoporosis with current pathological fracture, unspecified site, subsequent encounter for fracture with malunion: Secondary | ICD-10-CM | POA: Diagnosis not present

## 2023-08-23 DIAGNOSIS — N8110 Cystocele, unspecified: Secondary | ICD-10-CM

## 2023-08-23 DIAGNOSIS — N3281 Overactive bladder: Secondary | ICD-10-CM | POA: Diagnosis not present

## 2023-08-23 DIAGNOSIS — M816 Localized osteoporosis [Lequesne]: Secondary | ICD-10-CM | POA: Diagnosis not present

## 2023-08-23 DIAGNOSIS — Z1231 Encounter for screening mammogram for malignant neoplasm of breast: Secondary | ICD-10-CM

## 2023-08-23 DIAGNOSIS — Z1211 Encounter for screening for malignant neoplasm of colon: Secondary | ICD-10-CM

## 2023-08-23 LAB — COMPREHENSIVE METABOLIC PANEL WITH GFR
AG Ratio: 1.6 (calc) (ref 1.0–2.5)
ALT: 24 U/L (ref 6–29)
AST: 37 U/L — ABNORMAL HIGH (ref 10–35)
Albumin: 4.4 g/dL (ref 3.6–5.1)
Alkaline phosphatase (APISO): 61 U/L (ref 37–153)
BUN: 17 mg/dL (ref 7–25)
CO2: 28 mmol/L (ref 20–32)
Calcium: 9.9 mg/dL (ref 8.6–10.4)
Chloride: 102 mmol/L (ref 98–110)
Creat: 0.81 mg/dL (ref 0.60–1.00)
Globulin: 2.7 g/dL (ref 1.9–3.7)
Glucose, Bld: 110 mg/dL — ABNORMAL HIGH (ref 65–99)
Potassium: 3.8 mmol/L (ref 3.5–5.3)
Sodium: 139 mmol/L (ref 135–146)
Total Bilirubin: 0.7 mg/dL (ref 0.2–1.2)
Total Protein: 7.1 g/dL (ref 6.1–8.1)
eGFR: 77 mL/min/{1.73_m2} (ref >=60)

## 2023-08-23 LAB — VITAMIN D 25 HYDROXY (VIT D DEFICIENCY, FRACTURES): Vit D, 25-Hydroxy: 65 ng/mL (ref 30–100)

## 2023-08-23 MED ORDER — ROMOSOZUMAB-AQQG 105 MG/1.17ML ~~LOC~~ SOSY: 210.0000 mg | PREFILLED_SYRINGE | Freq: Once | SUBCUTANEOUS | Status: DC

## 2023-08-23 NOTE — Progress Notes (Addendum)
   [name] 1950-12-23 [id number]   History:  73 y.o. presents for very bothersome urinary incontinence. Reports having to wear a diaper for constant loss of urine and gushes out urine when she walks She has nocturia as well 3-4 a night Has urgency and feels a bulge Denies splinting Works at [employer] office Had 2 svd 5lb and 7 lbs   Gynecologic History Patient's last menstrual period was 02/09/2005.    Health Maintenance Last Pap: 2015.  Last mammogram: 2024. Results were: negative. Referral placed for repeat Last colonoscopy: 2014. Has family member with colon cancer and colitis and severe reflux referral placed for repeat colonoscopy 2023 bone scan -2.3 with positive frax FRAX 10-year fracture risk calculator: 21.5 % for any major fracture and 6.0 % for hip fracture. 09/12/23 T score -2.2 left hip with positive frax that is higher 33.2% and 15.9%. Last stroke was in 2022 and not within the drug one year range to avoid. Strongly encouraged Evenity  and since she has had a fracture, she is 5 times more likely to have another fracture in the next year.  She is considering this.   No medication was started and well she got a fracture this August in the hospital and had a fracture to her right wrist.   Cannot do fosamax with her GI issues and reflux.   Past medical history, past surgical history, family history and social history were all reviewed and documented in the EPIC chart.  ROS:  A ROS was performed and pertinent positives and negatives are included.  Exam:  Vitals:   08/23/23 1419  BP: 118/76  Pulse: 76  SpO2: 96%  Weight: 166 lb (75.3 kg)  Height: 5' 1.25" (1.556 m)   Body mass index is 31.11 kg/m.  OBGyn Exam   Sve: stage 3 cystocele No lesions, normal discharge Assessment/Plan:  73 y.o. for bothersome urinary incontinence Referral placed to urogyn for treatment and evaluation Osteopenia with recent fragility fracture with positive frax. Discussed  treatment should be started asap and risk for another fracture. She is in agreement. To get labs and needs evenity  to bone build asap.  Counseled on the medication and the r/b/a/I of the medication and need for vit D and calcium . She agreed.  Information provided on the medication. To begin PA. Will begin treatment based on insurance and costs regardless of dxa but stat referral for repeat dxa since likely worse but do not want to delay her treatment. Return for annual exam or sooner with any concerns.  30 minutes spent on reviewing records, imaging,  and one on one patient time and counseling patient and documentation Dr. Caro Christmas

## 2023-08-23 NOTE — Patient Instructions (Signed)
UROGYN REFERRAL: Dr. Jay Schlichter and Dr. Florian Buff at [facility] office: [address], [address] [phone]  The referral was placed but it may take a couple of weeks for the visit to be coordinated.

## 2023-08-24 ENCOUNTER — Other Ambulatory Visit: Payer: Self-pay | Admitting: *Deleted

## 2023-08-24 DIAGNOSIS — M810 Age-related osteoporosis without current pathological fracture: Secondary | ICD-10-CM

## 2023-08-24 LAB — URINALYSIS, COMPLETE W/RFL CULTURE
Bacteria, UA: NONE SEEN /HPF
Bilirubin Urine: NEGATIVE
Glucose, UA: NEGATIVE
Hgb urine dipstick: NEGATIVE
Hyaline Cast: NONE SEEN /LPF
Ketones, ur: NEGATIVE
Nitrites, Initial: NEGATIVE
Protein, ur: NEGATIVE
RBC / HPF: NONE SEEN /HPF (ref 0–2)
Specific Gravity, Urine: 1.01 (ref 1.001–1.035)
pH: 7 (ref 5.0–8.0)

## 2023-08-24 LAB — CULTURE INDICATED

## 2023-08-24 LAB — URINE CULTURE
MICRO NUMBER:: 16454551
Result:: NO GROWTH
SPECIMEN QUALITY:: ADEQUATE

## 2023-08-24 MED ORDER — ROMOSOZUMAB-AQQG 105 MG/1.17ML ~~LOC~~ SOSY: 210.0000 mg | PREFILLED_SYRINGE | Freq: Once | SUBCUTANEOUS | Status: DC

## 2023-08-25 ENCOUNTER — Other Ambulatory Visit: Payer: Self-pay

## 2023-08-25 MED ORDER — NITROFURANTOIN MONOHYD MACRO 100 MG PO CAPS: 100.0000 mg | ORAL_CAPSULE | Freq: Two times a day (BID) | ORAL | 0 refills | Status: DC

## 2023-08-25 NOTE — Addendum Note (Signed)
 Addended by: Reinaldo Caras on: 08/25/2023 12:31 PM   Modules accepted: Level of Service

## 2023-08-29 ENCOUNTER — Other Ambulatory Visit: Payer: Self-pay | Admitting: *Deleted

## 2023-08-29 DIAGNOSIS — M810 Age-related osteoporosis without current pathological fracture: Secondary | ICD-10-CM

## 2023-08-29 MED ORDER — ROMOSOZUMAB-AQQG 105 MG/1.17ML ~~LOC~~ SOSY: 210.0000 mg | PREFILLED_SYRINGE | SUBCUTANEOUS | Status: DC

## 2023-08-31 ENCOUNTER — Encounter: Payer: Self-pay | Admitting: Adult Health

## 2023-09-05 NOTE — Telephone Encounter (Signed)
 Ok to start medication since last known stroke was in 2022, more then 12 months ago.

## 2023-09-11 ENCOUNTER — Ambulatory Visit (HOSPITAL_BASED_OUTPATIENT_CLINIC_OR_DEPARTMENT_OTHER)
Admission: RE | Admit: 2023-09-11 | Discharge: 2023-09-11 | Disposition: A | Discharge status: Home / Self Care | Source: Ambulatory Visit | Attending: Obstetrics and Gynecology | Admitting: Obstetrics and Gynecology

## 2023-09-11 ENCOUNTER — Encounter (HOSPITAL_BASED_OUTPATIENT_CLINIC_OR_DEPARTMENT_OTHER): Payer: Self-pay | Admitting: Radiology

## 2023-09-11 DIAGNOSIS — Z1231 Encounter for screening mammogram for malignant neoplasm of breast: Secondary | ICD-10-CM | POA: Insufficient documentation

## 2023-09-11 DIAGNOSIS — M8589 Other specified disorders of bone density and structure, multiple sites: Secondary | ICD-10-CM | POA: Diagnosis not present

## 2023-09-11 DIAGNOSIS — M8000XP Age-related osteoporosis with current pathological fracture, unspecified site, subsequent encounter for fracture with malunion: Secondary | ICD-10-CM | POA: Diagnosis not present

## 2023-09-11 DIAGNOSIS — M816 Localized osteoporosis [Lequesne]: Secondary | ICD-10-CM | POA: Insufficient documentation

## 2023-09-11 DIAGNOSIS — Z780 Asymptomatic menopausal state: Secondary | ICD-10-CM | POA: Diagnosis not present

## 2023-09-12 NOTE — Telephone Encounter (Signed)
 Patient called with dxa results. Reviewed prior scan and recent results.  2023 bone scan -2.3 with positive frax FRAX 10-year fracture risk calculator: 21.5 % for any major fracture and 6.0 % for hip fracture. 09/12/23 T score -2.2 left hip with positive frax that is higher 33.2% and 15.9% Strongly encouraged Evenity  and since she has had a fracture, she is 5 times more likely to have another fracture in the next year.    No medication was started and well she got a fracture this August in the hospital and had a fracture to her right wrist.   Cannot do fosamax with her GI issues and reflux. Fear is with her history of stroke and Osteonecrosis of the Jaw. Last stroke in 2022. Discussed risk is higher for a fracture then osteonecrosis of the jaw.  Discussed can stop with oral procedures as well.   She still may have Evenity  with greater than a year h/o stroke.  In Study with evenity  to placebo there was no significance, but we will watch her closely.  In study with evenity  vs. Fosamax they both had stroke seen but slightly higher with Evenity . They have the warning with the proceeding year to avoid it to be safe with a MI or stroke.  This was explained to patient.She will consider the evenity  and let us  know.  I will let Sherline Distel know as well as she has been scheduled.  Patient encouraged to let us  know asap and encouraged on the medication safety and importance.  Dr. Tia Flowers

## 2023-09-12 NOTE — Telephone Encounter (Signed)
 Thank you :)

## 2023-09-13 ENCOUNTER — Ambulatory Visit

## 2023-09-21 ENCOUNTER — Encounter: Payer: Self-pay | Admitting: Oncology

## 2023-09-26 ENCOUNTER — Telehealth: Payer: Self-pay | Admitting: *Deleted

## 2023-09-26 NOTE — Telephone Encounter (Signed)
 Patient cancelled her appointment for Evenity .   Routing to provider as FYI.

## 2023-09-26 NOTE — Telephone Encounter (Signed)
 Dian Formosa  You55 minutes ago (8:24 AM)   CS FYI she cancelled her evenity .     [place]  P [facility] Admin9 hours ago (11:43 PM)    Appointment canceled for TONA QUALLEY ([id number]) Visit type: NURSE VISIT 09/28/2023 2:15 PM (30 minutes) with [facility] in [facility]   Reason for cancellation: Canceled via MyChart   Patient comments: I want to wait awhile before I start.    Jock Muller [facility] Admin3 weeks ago    Appointment for: BRELEIGH CARPINO ([id number]) Visit type: NURSE VISIT (1015) 09/28/2023 2:15 PM (30 minutes) with [facility] N[facility] in [facility]   Patient comments:   ---------------------------------- This appointment rescheduled from: 09/13/2023 11:45 AM (30 minutes) with [facility] NUR[facility] in [facility]

## 2023-09-28 ENCOUNTER — Ambulatory Visit

## 2023-10-03 ENCOUNTER — Ambulatory Visit (INDEPENDENT_AMBULATORY_CARE_PROVIDER_SITE_OTHER): Admitting: Obstetrics and Gynecology

## 2023-10-03 ENCOUNTER — Other Ambulatory Visit (HOSPITAL_COMMUNITY)
Admission: RE | Admit: 2023-10-03 | Discharge: 2023-10-03 | Disposition: A | Discharge status: Home / Self Care | Source: Ambulatory Visit | Attending: Obstetrics and Gynecology | Admitting: Obstetrics and Gynecology

## 2023-10-03 ENCOUNTER — Encounter: Payer: Self-pay | Admitting: Obstetrics and Gynecology

## 2023-10-03 VITALS — BP 117/76 | HR 72 | Ht 61.0 in | Wt 168.0 lb

## 2023-10-03 DIAGNOSIS — N8110 Cystocele, unspecified: Secondary | ICD-10-CM | POA: Diagnosis not present

## 2023-10-03 DIAGNOSIS — K5904 Chronic idiopathic constipation: Secondary | ICD-10-CM | POA: Diagnosis not present

## 2023-10-03 DIAGNOSIS — R82998 Other abnormal findings in urine: Secondary | ICD-10-CM

## 2023-10-03 DIAGNOSIS — R350 Frequency of micturition: Secondary | ICD-10-CM | POA: Diagnosis not present

## 2023-10-03 DIAGNOSIS — N3281 Overactive bladder: Secondary | ICD-10-CM | POA: Diagnosis not present

## 2023-10-03 LAB — POCT URINALYSIS DIP (CLINITEK)
Bilirubin, UA: NEGATIVE
Blood, UA: NEGATIVE
Glucose, UA: NEGATIVE mg/dL
Nitrite, UA: NEGATIVE
POC PROTEIN,UA: NEGATIVE
Spec Grav, UA: 1.02 (ref 1.010–1.025)
Urobilinogen, UA: 0.2 U/dL
pH, UA: 7 (ref 5.0–8.0)

## 2023-10-03 NOTE — Patient Instructions (Addendum)
 We discussed the symptoms of overactive bladder (OAB), which include urinary urgency, urinary frequency, night-time urination, with or without urge incontinence.  We discussed management including behavioral therapy (decreasing bladder irritants by following a bladder diet, urge suppression strategies, timed voids, bladder retraining), physical therapy, medication; and for refractory cases posterior tibial nerve stimulation, sacral neuromodulation, and intravesical botulinum toxin injection.   You have a stage 2 (out of 4) prolapse.  We discussed the fact that it is not life threatening but there are several treatment options. For treatment of pelvic organ prolapse, we discussed options for management including expectant management, conservative management, and surgical management, such as Kegels, a pessary, pelvic floor physical therapy, and specific surgical procedures.     Constipation: Our goal is to achieve formed bowel movements daily or every-other-day.  You may need to try different combinations of the following options to find what works best for you - everybody's body works differently so feel free to adjust the dosages as needed.  Some options to help maintain bowel health include:  Dietary changes (more leafy greens, vegetables and fruits; less processed foods) Fiber supplementation (Benefiber, FiberCon, Metamucil or Psyllium). Start slow and increase gradually to full dose. Over-the-counter agents such as: stool softeners (Docusate or Colace) and/or laxatives (Miralax , milk of magnesia). Recommend taking miralax  daily in large glass of water  Power Pudding is a natural mixture that may help your constipation.  To make blend 1 cup applesauce, 1 cup wheat bran, and 3/4 cup prune juice, refrigerate and then take 1 tablespoon daily with a large glass of water  as needed.   URODYNAMICS (UDS) TEST INFORMATION  IMPORTANT: Please try to arrive with a comfortably full bladder!    What is  UDS? Urodynamics is a bladder test used to evaluate how your bladder and urethra (tube you urinate out of) work to help find out the cause of your bladder symptoms and evaluate your bladder function in order to make the best treatment plan for you.   What to expect? A nurse will perform the test and will be with you during the entire exam. First we will have to empty your bladder on a special toilet.  After you have emptied your bladder, very small catheters (plastic tubing) will be placed into your bladder and into your vagina (or rectum). These special small catheters measure pressure to help measure your bladder function.  Your bladder will be gently filled with water  and you will be asked to cough and strain at several different points during the test.   You will then be asked to empty your bladder in the special toilet with the catheters in place. Most patients can urinate (pee) easily with the catheters in place since the catheters are so small. In total this procedure lasts about 45 minutes to 1 hour.  After your test is completed, you will return to review the results, talk about treatment options and make a plan moving forward.

## 2023-10-03 NOTE — Assessment & Plan Note (Signed)
-   We discussed the symptoms of overactive bladder (OAB), which include urinary urgency, urinary frequency, nocturia, with or without urge incontinence.  While we do not know the exact etiology of OAB, several treatment options exist. We discussed management including behavioral therapy (decreasing bladder irritants, urge suppression strategies, timed voids, bladder retraining), physical therapy, medication; for refractory cases posterior tibial nerve stimulation, sacral neuromodulation, and intravesical botulinum toxin injection.  - Symptoms likely exacerbated by multiple strokes. Will have her undergo urodynamic testing to assess further.

## 2023-10-03 NOTE — Assessment & Plan Note (Signed)
-   For constipation, we reviewed the importance of a better bowel regimen.  We also discussed the importance of avoiding chronic straining, as it can exacerbate her pelvic floor symptoms; we discussed treating constipation and straining prior to surgery, as postoperative straining can lead to damage to the repair and recurrence of symptoms. We discussed initiating therapy with increasing fluid intake, fiber supplementation, stool softeners, and laxatives such as miralax .  - Recommended starting with daily Miralax

## 2023-10-03 NOTE — Assessment & Plan Note (Addendum)
 Stage II anterior, Stage I posterior, Stage I apical prolapse - For treatment of pelvic organ prolapse, we discussed options for management including expectant management, conservative management, and surgical management, such as Kegels, a pessary, pelvic floor physical therapy, and specific surgical procedures. - She is unsure about a pessary and is more interested in surgery. We discussed that the least invasive procedure would be an anterior repair with sacrospinous hysteropexy. Handouts provided for her to review.  - Will need neurology clearance/ risk assessment due to history of multiple strokes

## 2023-10-03 NOTE — Progress Notes (Signed)
 New Patient Evaluation and Consultation  Referring Provider: Glennon Almarie POUR, MD PCP: Rollene Almarie LABOR, MD Date of Service: 10/03/2023  SUBJECTIVE Chief Complaint: New Patient (Initial Visit) [name] is a 73 y.o. female is here for prolapse.)  History of Present Illness: [name] is a 73 y.o. White or Caucasian female seen in consultation at the request of Dr Glennon for evaluation of prolapse and incontinence.     Urinary Symptoms: Leaks urine with going from sitting to standing, with a full bladder, with movement to the bathroom, and with urgency. Can't stop urine once it starts. Leaks 12+ time(s) per day.  Pad use: 4-5 adult diapers per day.   Patient is bothered by UI symptoms. Has taken myrbetriq  previously and it did not improve symptoms.  Has done home pelvic exercises  Day time voids 10-12.  Nocturia: 3-4 times per night to void. Voiding dysfunction:  does not empty bladder well.  Patient does not use a catheter to empty bladder.  When urinating, patient feels a weak stream and dribbling after finishing Drinks: 2-3 cups water  per day, occasional sprite or coffee  UTIs: 1 UTI's in the last year.   Denies history of blood in urine and kidney or bladder stones   Pelvic Organ Prolapse Symptoms:                  Patient Admits to a feeling of a bulge the vaginal area. It has been present for 2-3 years.  Patient Admits to seeing a bulge.  This bulge is bothersome. No prior treatment for the prolapse.   Bowel Symptom: Bowel movements: 2 time(s) per day Stool consistency: hard- usually [place] 2 Straining: yes. Has to put her finger in the rectum to remove stool Splinting: yes.  Incomplete evacuation: yes.  Patient Admits to accidental bowel leakage / fecal incontinence  Occurs: daily or every other day  Consistency with leakage: solid Bowel regimen: none Last colonoscopy: Date 2021- diverticulosis, hemorrhoids  Sexual Function Sexually  active: no.  Sexual orientation: Straight   Pelvic Pain Admits to pelvic pain Location: more in the back Pain occurs: all the time, but worse with lying down or sitting   Past Medical History:  Past Medical History:  Diagnosis Date   Arthritis    back, fingers with joint pain and swelling.  chronic back pain   Arthritis    Cataract    Chronic back pain    arthritis    Diverticulosis    Elevated cholesterol    takes Niacin daily and Simvastatin    GERD (gastroesophageal reflux disease) 06/2004   non-specific gastritis on EGD 06/2004   Headache(784.0)    occasionally   History of colon polyps 2004, 2009   2004:adenomatous. 2009 hyperplastic.    History of hiatal hernia    Hypertension    takes Tenoretic  and Lisinopril  daily   Microscopic colitis    Mucoid cyst of joint 08/2013   right index finger   Neuromuscular disorder (HCC)    numbness in right leg after knee replacement   Osteopenia 01/2014   T score -1.1 FRAX 14%/0.5%. Stable from prior DEXA   PONV (postoperative nausea and vomiting)    Rotator cuff arthropathy    Left   Seasonal allergies    Sleep apnea    wears CPAP nightly   Stroke (HCC) 1998   x 2 in 1998 - mild left-sided weakness & 1 in 2022   Urge incontinence    Uterine prolapse  Wrist fracture      Past Surgical History:   Past Surgical History:  Procedure Laterality Date   BACK SURGERY     x 2   BELPHAROPTOSIS REPAIR Bilateral 12/14/2017   BUBBLE STUDY  02/16/2021   Procedure: BUBBLE STUDY;  Surgeon: Alveta Aleene PARAS, MD;  Location: [facility] ENDOSCOPY;  Service: Cardiovascular;;   CARPAL TUNNEL RELEASE Bilateral    Dr Addie   CATARACT EXTRACTION     CATARACT EXTRACTION Bilateral 10/2017   COLONOSCOPY  2004, 2009, 2014   FINGER SURGERY     GUM SURGERY     GYNECOLOGIC CRYOSURGERY     HYSTEROSCOPY WITH D & C  12/07/2010   with resection of endometrial polyp   JOINT REPLACEMENT     KNEE ARTHROSCOPY Left 2008   lip biopsy     done at MD  office Fri 11/22/13   MASS EXCISION Right 08/15/2013   Procedure: RIGHT INDEX EXCISION MASS ;  Surgeon: Franky JONELLE Curia, MD;  Location: [facility];  Service: Orthopedics;  Laterality: Right;   NASAL SEPTUM SURGERY     OOPHORECTOMY Right 2000   REVERSE SHOULDER ARTHROPLASTY Left 12/11/2018   REVERSE SHOULDER ARTHROPLASTY Left 12/11/2018   Procedure: LEFT REVERSE SHOULDER ARTHROPLASTY;  Surgeon: Addie Cordella Hamilton, MD;  Location: [facility] OR;  Service: Orthopedics;  Laterality: Left;   SHOULDER ARTHROSCOPY WITH OPEN ROTATOR CUFF REPAIR AND DISTAL CLAVICLE ACROMINECTOMY Right 11/26/2013   Procedure: RIGHT SHOULDER ARTHROSCOPY WITH MINI OPEN ROTATOR CUFF REPAIR AND DISTAL CLAVICLE RESECTION, SUBACROMIAL DECOMPRESSION, POSSIBLE [facility] PATCH.;  Surgeon: Maude LELON Right, MD;  Location: [facility] OR;  Service: Orthopedics;  Laterality: Right;   TEE WITHOUT CARDIOVERSION N/A 02/16/2021   Procedure: TRANSESOPHAGEAL ECHOCARDIOGRAM (TEE);  Surgeon: Alveta Aleene PARAS, MD;  Location: [facility] ENDOSCOPY;  Service: Cardiovascular;  Laterality: N/A;   TOTAL KNEE ARTHROPLASTY Right 03/21/2017   TOTAL KNEE ARTHROPLASTY Right 03/21/2017   Procedure: RIGHT TOTAL KNEE ARTHROPLASTY;  Surgeon: Right Maude LELON, MD;  Location: [facility] OR;  Service: Orthopedics;  Laterality: Right;   TOTAL KNEE ARTHROPLASTY Left 11/29/2022   Procedure: LEFT TOTAL KNEE ARTHROPLASTY;  Surgeon: Addie Cordella Hamilton, MD;  Location: [facility] ORS;  Service: Orthopedics;  Laterality: Left;   TUBAL LIGATION       Past OB/GYN History: OB History  Gravida Para Term Preterm AB Living  2 2 2   2   SAB IAB Ectopic Multiple Live Births          # Outcome Date GA Lbr Len/2nd Weight Sex Type Anes PTL Lv  2 Term      Vag-Spont     1 Term      Vag-Spont       Menopausal: Denies vaginal bleeding since menopause Last pap smear was 2018.   No results found for: DIAGPAP, HPVHIGH, ADEQPAP  Medications: Patient has a current medication list which  includes the following prescription(s): acetaminophen , alpha-lipoic acid, amoxicillin , vitamin c , aspirin , atenolol -chlorthalidone , b complex vitamins, calcium  carbonate, d 5000, coq10, diclofenac  sodium, diphenoxylate -atropine , elderberry, folic acid , glutathione, ketotifen , loperamide, melatonin, fiber, methocarbamol , multivitamin with minerals, multiple vitamins-minerals, nitrofurantoin  (macrocrystal-monohydrate), OVER THE COUNTER MEDICATION, OVER THE COUNTER MEDICATION, OVER THE COUNTER MEDICATION, potassium chloride  sa, saccharomyces boulardii, simvastatin , and turmeric, and the following Facility-Administered Medications: romosozumab -aqqg, romosozumab -aqqg, and [START ON 10/13/2023] romosozumab -aqqg.   Allergies: Patient is allergic to egg-derived products, etodolac, fish-derived products, omeprazole, pineapple, and gluten meal.   Social History:  Social History   Tobacco Use   Smoking status: Never  Passive exposure: Yes   Smokeless tobacco: Never  Vaping Use   Vaping status: Never Used  Substance Use Topics   Alcohol  use: Not Currently   Drug use: No    Relationship status: married Patient lives with husband.   Patient is not employed. Regular exercise: Yes: pedals for knees History of abuse: No  Family History:   Family History  Problem Relation Age of Onset   Hypertension Mother    Heart disease Mother    Diabetes Father    Hypertension Father    Heart disease Father    Stroke Father    Diabetes Maternal Aunt    Diabetes Paternal Grandmother    Hypertension Paternal Grandfather    Stroke Paternal Grandfather      Review of Systems: Review of Systems  Constitutional:  Positive for malaise/fatigue. Negative for fever and weight loss.  Respiratory:  Negative for cough, shortness of breath and wheezing.   Cardiovascular:  Positive for leg swelling. Negative for chest pain and palpitations.  Gastrointestinal:  Negative for abdominal pain and blood in stool.   Genitourinary:  Negative for dysuria.  Musculoskeletal:  Positive for myalgias.  Skin:  Negative for rash.  Neurological:  Negative for dizziness and headaches.  Endo/Heme/Allergies:  Bruises/bleeds easily.  Psychiatric/Behavioral:  Negative for depression. The patient is not nervous/anxious.      OBJECTIVE Physical Exam: Vitals:   10/03/23 1445  BP: 117/76  Pulse: 72  Weight: 168 lb (76.2 kg)  Height: 5' 1 (1.549 m)    Physical Exam Vitals reviewed. Exam conducted with a chaperone present.  Constitutional:      General: She is not in acute distress. Pulmonary:     Effort: Pulmonary effort is normal.  Abdominal:     General: There is no distension.     Palpations: Abdomen is soft.     Tenderness: There is no abdominal tenderness. There is no rebound.   Musculoskeletal:        General: No swelling. Normal range of motion.   Skin:    General: Skin is warm and dry.     Findings: No rash.   Neurological:     Mental Status: She is alert and oriented to person, place, and time.   Psychiatric:        Mood and Affect: Mood normal.        Behavior: Behavior normal.      GU / Detailed Urogynecologic Evaluation:  Pelvic Exam: Normal external female genitalia; Bartholin's and Skene's glands normal in appearance; urethral meatus normal in appearance, no urethral masses or discharge.   CST: negative  Speculum exam reveals normal vaginal mucosa with atrophy. Cervix normal appearance. Uterus normal single, nontender. Adnexa no mass, fullness, tenderness.    Pelvic floor strength II/V, puborectalis II/V external anal sphincter II/V  Pelvic floor musculature: Right levator non-tender, Right obturator non-tender, Left levator non-tender, Left obturator non-tender  POP-Q:   POP-Q  0.5                                            Aa   0.5                                           Ba  -5.5  C   3.5                                             Gh  3                                            Pb  6.5                                            tvl   -2.5                                            Ap  -2.5                                            Bp  -4                                              D      Rectal Exam:  Normal sphincter tone, no distal rectocele, enterocoele not present, no rectal masses, no sign of dyssynergia when asking the patient to bear down.  Post-Void Residual (PVR) by Bladder Scan: In order to evaluate bladder emptying, we discussed obtaining a postvoid residual and patient agreed to this procedure.  Procedure: The ultrasound unit was placed on the patient's abdomen in the suprapubic region after the patient had voided.      Laboratory Results: Lab Results  Component Value Date   COLORU yellow 10/03/2023   CLARITYU clear 10/03/2023   GLUCOSEUR negative 10/03/2023   BILIRUBINUR negative 10/03/2023   SPECGRAV 1.020 10/03/2023   RBCUR negative 10/03/2023   PHUR 7.0 10/03/2023   PROTEINUR NEGATIVE 08/23/2023   UROBILINOGEN 0.2 10/03/2023   LEUKOCYTESUR Trace (A) 10/03/2023    Lab Results  Component Value Date   CREATININE 0.81 08/23/2023   CREATININE 0.85 11/11/2022   CREATININE 0.87 03/25/2022    Lab Results  Component Value Date   HGBA1C 5.3 02/15/2021    Lab Results  Component Value Date   HGB 14.6 07/24/2023     ASSESSMENT AND PLAN Ms. Reznick is a 73 y.o. with:  1. Overactive bladder   2. Urinary frequency   3. Prolapse of anterior vaginal wall   4. Chronic idiopathic constipation   5. Leukocytes in urine     Overactive bladder Assessment & Plan: - We discussed the symptoms of overactive bladder (OAB), which include urinary urgency, urinary frequency, nocturia, with or without urge incontinence.  While we do not know the exact etiology of OAB, several treatment options exist. We discussed management including behavioral therapy (decreasing  bladder irritants, urge suppression strategies, timed voids, bladder retraining), physical therapy, medication; for refractory cases posterior tibial nerve stimulation, sacral neuromodulation, and intravesical botulinum toxin injection.  - Symptoms likely  exacerbated by multiple strokes. Will have her undergo urodynamic testing to assess further.     Urinary frequency -     POCT URINALYSIS DIP (CLINITEK)  Prolapse of anterior vaginal wall Assessment & Plan: Stage II anterior, Stage I posterior, Stage I apical prolapse - For treatment of pelvic organ prolapse, we discussed options for management including expectant management, conservative management, and surgical management, such as Kegels, a pessary, pelvic floor physical therapy, and specific surgical procedures. - She is unsure about a pessary and is more interested in surgery. We discussed that the least invasive procedure would be an anterior repair with sacrospinous hysteropexy. Handouts provided for her to review.  - Will need neurology clearance/ risk assessment due to history of multiple strokes   Chronic idiopathic constipation Assessment & Plan: - For constipation, we reviewed the importance of a better bowel regimen.  We also discussed the importance of avoiding chronic straining, as it can exacerbate her pelvic floor symptoms; we discussed treating constipation and straining prior to surgery, as postoperative straining can lead to damage to the repair and recurrence of symptoms. We discussed initiating therapy with increasing fluid intake, fiber supplementation, stool softeners, and laxatives such as miralax .  - Recommended starting with daily Miralax    Leukocytes in urine -     Urine Culture; Future  Return for urodynamic testing   [name], MD

## 2023-10-04 LAB — URINE CULTURE: Culture: <10000 — AB

## 2023-10-10 ENCOUNTER — Other Ambulatory Visit (HOSPITAL_COMMUNITY)
Admission: RE | Admit: 2023-10-10 | Discharge: 2023-10-10 | Disposition: A | Discharge status: Home / Self Care | Source: Other Acute Inpatient Hospital | Attending: Obstetrics | Admitting: Obstetrics

## 2023-10-10 ENCOUNTER — Ambulatory Visit (INDEPENDENT_AMBULATORY_CARE_PROVIDER_SITE_OTHER): Admitting: Obstetrics and Gynecology

## 2023-10-10 VITALS — BP 91/64 | HR 65

## 2023-10-10 DIAGNOSIS — N3281 Overactive bladder: Secondary | ICD-10-CM

## 2023-10-10 DIAGNOSIS — R319 Hematuria, unspecified: Secondary | ICD-10-CM

## 2023-10-10 DIAGNOSIS — R82998 Other abnormal findings in urine: Secondary | ICD-10-CM | POA: Diagnosis not present

## 2023-10-10 DIAGNOSIS — N8110 Cystocele, unspecified: Secondary | ICD-10-CM

## 2023-10-10 DIAGNOSIS — N814 Uterovaginal prolapse, unspecified: Secondary | ICD-10-CM

## 2023-10-10 DIAGNOSIS — R350 Frequency of micturition: Secondary | ICD-10-CM

## 2023-10-10 DIAGNOSIS — N393 Stress incontinence (female) (male): Secondary | ICD-10-CM

## 2023-10-10 LAB — URINALYSIS, ROUTINE W REFLEX MICROSCOPIC
Bilirubin Urine: NEGATIVE
Glucose, UA: NEGATIVE mg/dL
Hgb urine dipstick: NEGATIVE
Ketones, ur: NEGATIVE mg/dL
Nitrite: NEGATIVE
Protein, ur: NEGATIVE mg/dL
Specific Gravity, Urine: 1.019 (ref 1.005–1.030)
pH: 6 (ref 5.0–8.0)

## 2023-10-10 LAB — POCT URINALYSIS DIP (CLINITEK)
Bilirubin, UA: NEGATIVE
Glucose, UA: NEGATIVE mg/dL
Nitrite, UA: NEGATIVE
POC PROTEIN,UA: NEGATIVE
Spec Grav, UA: 1.015 (ref 1.010–1.025)
Urobilinogen, UA: 0.2 U/dL
pH, UA: 7 (ref 5.0–8.0)

## 2023-10-10 NOTE — Progress Notes (Signed)
 [facility] Urodynamics Procedure  Referring Physician: Rollene Almarie LABOR, * Date of Procedure: 10/10/2023  [name] is a 73 y.o. female who presents for urodynamic evaluation. Indication(s) for study: mixed incontinence  Vital Signs: BP 91/64   Pulse 65   LMP 02/09/2005   Laboratory Results: A clean catch urine specimen revealed:  POC urine:  Lab Results  Component Value Date   COLORU yellow 10/10/2023   CLARITYU clear 10/10/2023   GLUCOSEUR negative 10/10/2023   BILIRUBINUR negative 10/10/2023   SPECGRAV 1.015 10/10/2023   RBCUR trace-intact (A) 10/10/2023   PHUR 7.0 10/10/2023   PROTEINUR NEGATIVE 08/23/2023   UROBILINOGEN 0.2 10/10/2023   LEUKOCYTESUR Small (1+) (A) 10/10/2023     Voiding Diary: Deferred  Procedure Timeout:  The correct patient was verified and the correct procedure was verified. The patient was in the correct position and safety precautions were reviewed based on at the patient's history.  Urodynamic Procedure A 64F dual lumen urodynamics catheter was placed under sterile conditions into the patient's bladder. A 64F catheter was placed into the rectum in order to measure abdominal pressure. EMG patches were placed in the appropriate position.  All connections were confirmed and calibrations/adjusted made. Saline was instilled into the bladder through the dual lumen catheters.  Cough/valsalva pressures were measured periodically during filling.  Patient was allowed to void.  The bladder was then emptied of its residual.  UROFLOW: Revealed a Qmax of 7.6 mL/sec.  She voided 56 mL and had a residual of 25 mL.  It was a normal pattern and represented normal habits though interpretation limited due to low voided volume.  CMG: This was performed with sterile water  in the sitting position at a fill rate of 30 mL/min.    First sensation of fullness was 135 mLs,  First urge was 236 mLs,  Strong urge was 382 mLs and  Capacity was 504  mLs  Stress incontinence was demonstrated Highest positive Barrier CLPP was 84 cmH20 at 326 ml. Highest positive Barrier VLPP was 96 cmH20 at 326 ml.  Detrusor function was overactive, with phasic contractions seen.  The first occurred at 242 mL to 3 cm of water  and was not associated with urge.  Compliance:  Normal. End fill detrusor pressure was 11.2cmH20.  Calculated compliance was 21mL/cmH20  UPP: MUCP with barrier reduction was 35 cm of water .    MICTURITION STUDY: Voiding was performed with reduction using pessary in the sitting position.  Pdet at Qmax was 4.3 cm of water .  Qmax was 18.6 mL/sec.  It was a prolonged normal pattern.  She voided 429 mL and had a residual of 75 mL.  It was a volitional void, sustained detrusor contraction was present and abdominal straining was not present  EMG: This was performed with patches.  She had voluntary contractions, recruitment with fill was not present and urethral sphincter was relaxed with void.  The details of the procedure with the study tracings have been scanned into EPIC.   Urodynamic Impression:  1. Sensation was normal; capacity was normal 2. Stress Incontinence was demonstrated at normal pressures; 3. Detrusor Overactivity was demonstrated with leakage. 4. Emptying was normal with a normal PVR, a sustained detrusor contraction present,  abdominal straining not present, normal urethral sphincter activity on EMG.  Plan: - The patient will follow up  to discuss the findings and treatment options.  -Patient was fit with a #3 ring pessary with support (Lot [id number]). It was comfortable to patient and she  was able to attempt urination and walk around without pessary expulsion.Patient unsure how she feels about the pessary. We discussed if it is too uncomfortable she can call and we can remove pessary. Patient agrees with this plan of care. She reports if it is not too uncomfortable that she will leave it in and discuss with Dr.  [name] on July 11th her plan moving forward.

## 2023-10-12 ENCOUNTER — Ambulatory Visit: Payer: Self-pay | Admitting: Obstetrics and Gynecology

## 2023-10-12 DIAGNOSIS — R82998 Other abnormal findings in urine: Secondary | ICD-10-CM

## 2023-10-12 LAB — URINE CULTURE: Culture: 30000 — AB

## 2023-10-12 MED ORDER — SULFAMETHOXAZOLE-TRIMETHOPRIM 800-160 MG PO TABS: 1.0000 | ORAL_TABLET | Freq: Two times a day (BID) | ORAL | 0 refills | Status: AC

## 2023-10-20 ENCOUNTER — Ambulatory Visit (INDEPENDENT_AMBULATORY_CARE_PROVIDER_SITE_OTHER): Admitting: Obstetrics and Gynecology

## 2023-10-20 ENCOUNTER — Encounter: Payer: Self-pay | Admitting: Obstetrics and Gynecology

## 2023-10-20 VITALS — BP 129/82 | HR 71

## 2023-10-20 DIAGNOSIS — N393 Stress incontinence (female) (male): Secondary | ICD-10-CM | POA: Diagnosis not present

## 2023-10-20 DIAGNOSIS — N812 Incomplete uterovaginal prolapse: Secondary | ICD-10-CM | POA: Diagnosis not present

## 2023-10-20 DIAGNOSIS — N3281 Overactive bladder: Secondary | ICD-10-CM | POA: Diagnosis not present

## 2023-10-20 DIAGNOSIS — N952 Postmenopausal atrophic vaginitis: Secondary | ICD-10-CM | POA: Diagnosis not present

## 2023-10-20 MED ORDER — TROSPIUM CHLORIDE ER 60 MG PO CP24: 1.0000 | ORAL_CAPSULE | Freq: Every day | ORAL | 5 refills | Status: AC

## 2023-10-20 MED ORDER — ESTRADIOL 0.1 MG/GM VA CREA
TOPICAL_CREAM | VAGINAL | 11 refills | Status: AC
Start: 2023-10-23 — End: ?

## 2023-10-20 NOTE — Progress Notes (Signed)
 [facility] Return Visit  SUBJECTIVE  History of Present Illness: [name] is a 73 y.o. female seen in follow-up for prolapse and incontinence. She underwent urodynamic testing.   Urodynamic Impression:  1. Sensation was normal; capacity was normal 2. Stress Incontinence was demonstrated at normal pressures; 3. Detrusor Overactivity was demonstrated with leakage. 4. Emptying was normal with a normal PVR, a sustained detrusor contraction present,  abdominal straining not present, normal urethral sphincter activity on EMG.  Was previously fit with a #3 ring with support. It has worked ok but has been slipping some. Has not fallen out, she just pushes it back up. She did have a few days where she didn't wet herself with the pessary.   Past Medical History: Patient  has a past medical history of Arthritis, Arthritis, Cataract, Chronic back pain, Diverticulosis, Elevated cholesterol, GERD (gastroesophageal reflux disease) (06/2004), Headache(784.0), History of colon polyps (2004, 2009), History of hiatal hernia, Hypertension, Microscopic colitis, Mucoid cyst of joint (08/2013), Neuromuscular disorder (HCC), Osteopenia (01/2014), PONV (postoperative nausea and vomiting), Rotator cuff arthropathy, Seasonal allergies, Sleep apnea, Stroke (HCC) (1998), Urge incontinence, Uterine prolapse, and Wrist fracture.   Past Surgical History: She  has a past surgical history that includes Oophorectomy (Right, 2000); Tubal ligation; Back surgery; Gynecologic cryosurgery; Nasal septum surgery; Hysteroscopy with D & C (12/07/2010); Knee arthroscopy (Left, 2008); Gum surgery; Colonoscopy (2004, 2009, 2014); Mass excision (Right, 08/15/2013); lip biopsy; Shoulder arthroscopy with open rotator cuff repair and distal clavicle acrominectomy (Right, 11/26/2013); Finger surgery; Total knee arthroplasty (Right, 03/21/2017); Total knee arthroplasty (Right, 03/21/2017); Cataract extraction; Cataract  extraction (Bilateral, 10/2017); Blepharoptosis repair (Bilateral, 12/14/2017); Joint replacement; Reverse shoulder arthroplasty (Left, 12/11/2018); Reverse shoulder arthroplasty (Left, 12/11/2018); TEE without cardioversion (N/A, 02/16/2021); Bubble study (02/16/2021); Carpal tunnel release (Bilateral); and Total knee arthroplasty (Left, 11/29/2022).   Medications: She has a current medication list which includes the following prescription(s): acetaminophen , alpha-lipoic acid, vitamin c , aspirin , atenolol -chlorthalidone , b complex vitamins, calcium  carbonate, d 5000, coq10, diclofenac  sodium, diphenoxylate -atropine , elderberry, [START ON 10/23/2023] estradiol , folic acid , glutathione, ketotifen , loperamide, melatonin, fiber, methocarbamol , multivitamin with minerals, multiple vitamins-minerals, OVER THE COUNTER MEDICATION, OVER THE COUNTER MEDICATION, OVER THE COUNTER MEDICATION, potassium chloride  sa, saccharomyces boulardii, simvastatin , trospium  chloride, and turmeric, and the following Facility-Administered Medications: romosozumab -aqqg, romosozumab -aqqg, and romosozumab -aqqg.   Allergies: Patient is allergic to egg-derived products, etodolac, fish-derived products, omeprazole, pineapple, and gluten meal.   Social History: Patient  reports that she has never smoked. She has been exposed to tobacco smoke. She has never used smokeless tobacco. She reports that she does not currently use alcohol . She reports that she does not use drugs.     OBJECTIVE     Physical Exam: Vitals:   10/20/23 0823  BP: 129/82  Pulse: 71   Gen: No apparent distress, A&O x 3.  Detailed Urogynecologic Evaluation:  Pessary removed and cleaned. A new #4 incontinence dish pessary was placed.It was comfortable, fit well, and stayed in placed with strong cough, valsalva, bending, and voiding.  Lot # [id number]  Prior exam showed:  POP-Q   0.5                                            Aa   0.5  Ba   -5.5                                              C    3.5                                            Gh   3                                            Pb   6.5                                            tvl    -2.5                                            Ap   -2.5                                            Bp   -4                                              D        ASSESSMENT AND PLAN    Ms. Pinedo is a 73 y.o. with:  1. Uterovaginal prolapse, incomplete   2. SUI (stress urinary incontinence, female)   3. Overactive bladder   4. Vaginal atrophy     Uterovaginal prolapse, incomplete Assessment & Plan: - Fit with a #4 incontinence dish pessary today. She may still be interested in surgery but wants to give this a try first.  - If she keeps the pessary, she may be interested in managing herself, but for now, she will keep it in place until next visit.    SUI (stress urinary incontinence, female) Assessment & Plan: - We discussed the finding of SUI on urodynamic testing. She is not currently experiencing symptoms.  - Will use incontinence dish pessary for more urethral support.    Overactive bladder Assessment & Plan: - Will start medication. Has previously failed myebtriq. Trospium  ordered.  Orders: -     Trospium  Chloride ER; Take 1 capsule (60 mg total) by mouth daily.  Dispense: 30 capsule; Refill: 5  Vaginal atrophy Assessment & Plan: - experienced some burning with pessary placement. Will start estrace  cream and also discussed use of natural lubricant like vitamin E or coconut oil as needed. Advised to place with finger as high up into the vagina as possible and around the vaginal opening.   Orders: -     Estradiol ; Place 0.5g nightly for two weeks then twice a week after  Dispense: 42.5 g; Refill: 11  Return 4-6 weeks  [name], MD

## 2023-10-20 NOTE — Assessment & Plan Note (Signed)
-   Will start medication. Has previously failed myebtriq. Trospium  ordered.

## 2023-10-20 NOTE — Patient Instructions (Addendum)
 Start vaginal estrogen therapy nightly for two weeks then 2 times weekly at night for treatment of vaginal atrophy (dryness of the vaginal tissues).  Please let us  know if the prescription is too expensive and we can look for alternative options.   Vulvovaginal moisturizer Options: Vitamin E oil (pump or capsule) or cream (Gene's Vit E Cream) Coconut oil Silicone-based lubricant for use during intercourse (wet platinum is a brand available at most drugstores) Crisco Consider the ingredients of the product - the fewer the ingredients the better!  Directions for Use: Clean and dry your hands Gently dab the vulvar/vaginal area dry as needed Apply a "pea-sized" amount of the moisturizer onto your fingertip Using you other hand, open the labia  Apply the moisturizer to the vulvar/vaginal tissues Wear loose fitting underwear/clothing if possible following application Use moisturize up to 3 times daily as desired.   You were also prescribed trospium  60mg  ER daily for overactive bladder symptoms. Give this a few weeks to see if it is working.

## 2023-10-20 NOTE — Assessment & Plan Note (Addendum)
-   experienced some burning with pessary placement. Will start estrace  cream and also discussed use of natural lubricant like vitamin E or coconut oil as needed. Advised to place with finger as high up into the vagina as possible and around the vaginal opening.

## 2023-10-20 NOTE — Assessment & Plan Note (Signed)
-   We discussed the finding of SUI on urodynamic testing. She is not currently experiencing symptoms.  - Will use incontinence dish pessary for more urethral support.

## 2023-10-20 NOTE — Assessment & Plan Note (Signed)
-   Fit with a #4 incontinence dish pessary today. She may still be interested in surgery but wants to give this a try first.  - If she keeps the pessary, she may be interested in managing herself, but for now, she will keep it in place until next visit.

## 2023-10-23 ENCOUNTER — Encounter: Payer: Self-pay | Admitting: Obstetrics and Gynecology

## 2023-11-01 ENCOUNTER — Encounter: Payer: Self-pay | Admitting: Adult Health

## 2023-11-02 ENCOUNTER — Other Ambulatory Visit: Payer: Self-pay | Admitting: Obstetrics and Gynecology

## 2023-11-02 DIAGNOSIS — N812 Incomplete uterovaginal prolapse: Secondary | ICD-10-CM

## 2023-11-10 ENCOUNTER — Other Ambulatory Visit: Payer: Self-pay

## 2023-11-10 DIAGNOSIS — E611 Iron deficiency: Secondary | ICD-10-CM

## 2023-11-12 DIAGNOSIS — W540XXA Bitten by dog, initial encounter: Secondary | ICD-10-CM | POA: Diagnosis not present

## 2023-11-12 DIAGNOSIS — A35 Other tetanus: Secondary | ICD-10-CM | POA: Diagnosis not present

## 2023-11-13 ENCOUNTER — Encounter: Payer: Self-pay | Admitting: Oncology

## 2023-11-13 ENCOUNTER — Inpatient Hospital Stay (HOSPITAL_BASED_OUTPATIENT_CLINIC_OR_DEPARTMENT_OTHER): Admitting: Oncology

## 2023-11-13 ENCOUNTER — Inpatient Hospital Stay: Attending: Oncology

## 2023-11-13 VITALS — BP 111/64 | HR 89 | Temp 98.7°F | Resp 19 | Ht 61.0 in | Wt 167.9 lb

## 2023-11-13 DIAGNOSIS — E611 Iron deficiency: Secondary | ICD-10-CM

## 2023-11-13 DIAGNOSIS — Z79899 Other long term (current) drug therapy: Secondary | ICD-10-CM | POA: Diagnosis not present

## 2023-11-13 DIAGNOSIS — D509 Iron deficiency anemia, unspecified: Secondary | ICD-10-CM | POA: Diagnosis not present

## 2023-11-13 DIAGNOSIS — D508 Other iron deficiency anemias: Secondary | ICD-10-CM

## 2023-11-13 LAB — CBC WITH DIFFERENTIAL (CANCER CENTER ONLY)
Abs Immature Granulocytes: 0.03 K/uL (ref 0.00–0.07)
Basophils Absolute: 0 K/uL (ref 0.0–0.1)
Basophils Relative: 0 %
Eosinophils Absolute: 0.2 K/uL (ref 0.0–0.5)
Eosinophils Relative: 2 %
HCT: 43.1 % (ref 36.0–46.0)
Hemoglobin: 14.4 g/dL (ref 12.0–15.0)
Immature Granulocytes: 0 %
Lymphocytes Relative: 26 %
Lymphs Abs: 2.4 K/uL (ref 0.7–4.0)
MCH: 30.3 pg (ref 26.0–34.0)
MCHC: 33.4 g/dL (ref 30.0–36.0)
MCV: 90.5 fL (ref 80.0–100.0)
Monocytes Absolute: 0.5 K/uL (ref 0.1–1.0)
Monocytes Relative: 5 %
Neutro Abs: 6.2 K/uL (ref 1.7–7.7)
Neutrophils Relative %: 67 %
Platelet Count: 191 K/uL (ref 150–400)
RBC: 4.76 MIL/uL (ref 3.87–5.11)
RDW: 13.3 % (ref 11.5–15.5)
WBC Count: 9.3 K/uL (ref 4.0–10.5)
nRBC: 0 % (ref 0.0–0.2)

## 2023-11-13 LAB — IRON AND TIBC
Iron: 60 ug/dL (ref 28–170)
Saturation Ratios: 17 % (ref 10.4–31.8)
TIBC: 349 ug/dL (ref 250–450)
UIBC: 289 ug/dL

## 2023-11-13 LAB — FERRITIN: Ferritin: 40 ng/mL (ref 11–307)

## 2023-11-13 NOTE — Progress Notes (Signed)
 Patient states she's doing fairly okay today with no new or acute concerns.

## 2023-11-13 NOTE — Progress Notes (Signed)
 Hematology/Oncology Consult note [facility]  [phone] [phone]  Patient Care Team: Rollene Almarie LABOR, MD as PCP - General (Internal Medicine) Lonni Slain, MD as PCP - Cardiology (Cardiology) [facility], [place] Melanee Annah BROCKS, MD as Consulting Physician (Oncology) Gregg Lek, MD as Consulting Physician (Neurology)   Name of the patient: [name]  [id number]  [birth year]   Date of visit: 11/13/23  Diagnosis-iron  deficiency anemia  Chief complaint/ Reason for visit-in follow-up of iron  deficiency anemia  Heme/Onc history:  Patient is a 73 year old female with a past medical history significant for hypertension hyperlipidemia osteoarthritis who has been referred for iron  deficiency anemia.  CBC from 03/24/2023 showed white count of 8.3, H&H of 10.9/25.7 with an MCV of 73 B12 level is normal at 837.  TSH normal.  Iron  studies from January 2025 showed low ferritin of 14 with an iron  saturation of 6% and elevated TIBC.  She underwent left knee surgery in August 2024.  At baseline her hemoglobin usually runs between 12.5-14.  She has had evidence of chronic iron  deficiency anemia even dating back to 3 years ago.  Colonoscopy was done in November 2021 by Dr. Aneita which showed diverticulosis but no evidence of bleeding.  Random biopsies were negative for malignancy.  EGD showed mild nonobstructive stenosis at the GE junction which was traversed.  Small hiatal hernia.  Biopsies were also taken for celiac disease which were negative.  She was found to have lymphocytic colitis for which she was prescribed oral steroids.   Interval history-patient is doing well for her age.  Denies any blood loss in her stool or urine.  Denies any dark melanotic stools.  She is taking oral iron  once a day which she is tolerating well.  ECOG PS- 1 Pain scale- 0  Review of systems- Review of Systems  Constitutional:   Negative for chills, fever, malaise/fatigue and weight loss.  HENT:  Negative for congestion, ear discharge and nosebleeds.   Eyes:  Negative for blurred vision.  Respiratory:  Negative for cough, hemoptysis, sputum production, shortness of breath and wheezing.   Cardiovascular:  Negative for chest pain, palpitations, orthopnea and claudication.  Gastrointestinal:  Negative for abdominal pain, blood in stool, constipation, diarrhea, heartburn, melena, nausea and vomiting.  Genitourinary:  Negative for dysuria, flank pain, frequency, hematuria and urgency.  Musculoskeletal:  Negative for back pain, joint pain and myalgias.  Skin:  Negative for rash.  Neurological:  Negative for dizziness, tingling, focal weakness, seizures, weakness and headaches.  Endo/Heme/Allergies:  Does not bruise/bleed easily.  Psychiatric/Behavioral:  Negative for depression and suicidal ideas. The patient does not have insomnia.       Allergies  Allergen Reactions   Egg-Derived Products Other (See Comments)    GI UPSET, JOINT PAIN, DIFF. SWALLOWING   Etodolac Other (See Comments)    ABD. CRAMPING   Fish-Derived Products Other (See Comments)    GI UPSET, JOINT PAIN, DIFF. SWALLOWING    Omeprazole Other (See Comments)    ABD. CRAMPING   Pineapple Other (See Comments)    GI UPSET, JOINT PAIN, DIFF. SWALLOWING   Gluten Meal Diarrhea    GI upset     Past Medical History:  Diagnosis Date   Arthritis    back, fingers with joint pain and swelling.  chronic back pain   Arthritis    Cataract    Chronic back pain    arthritis    Diverticulosis  Elevated cholesterol    takes Niacin daily and Simvastatin    GERD (gastroesophageal reflux disease) 06/2004   non-specific gastritis on EGD 06/2004   Headache(784.0)    occasionally   History of colon polyps 2004, 2009   2004:adenomatous. 2009 hyperplastic.    History of hiatal hernia    Hypertension    takes Tenoretic  and Lisinopril  daily   Microscopic colitis     Mucoid cyst of joint 08/2013   right index finger   Neuromuscular disorder (HCC)    numbness in right leg after knee replacement   Osteopenia 01/2014   T score -1.1 FRAX 14%/0.5%. Stable from prior DEXA   PONV (postoperative nausea and vomiting)    Rotator cuff arthropathy    Left   Seasonal allergies    Sleep apnea    wears CPAP nightly   Stroke (HCC) 1998   x 2 in 1998 - mild left-sided weakness & 1 in 2022   Urge incontinence    Uterine prolapse    Wrist fracture      Past Surgical History:  Procedure Laterality Date   BACK SURGERY     x 2   BELPHAROPTOSIS REPAIR Bilateral 12/14/2017   BUBBLE STUDY  02/16/2021   Procedure: BUBBLE STUDY;  Surgeon: Alveta Aleene PARAS, MD;  Location: [facility] ENDOSCOPY;  Service: Cardiovascular;;   CARPAL TUNNEL RELEASE Bilateral    Dr Addie   CATARACT EXTRACTION     CATARACT EXTRACTION Bilateral 10/2017   COLONOSCOPY  2004, 2009, 2014   FINGER SURGERY     GUM SURGERY     GYNECOLOGIC CRYOSURGERY     HYSTEROSCOPY WITH D & C  12/07/2010   with resection of endometrial polyp   JOINT REPLACEMENT     KNEE ARTHROSCOPY Left 2008   lip biopsy     done at MD office Fri 11/22/13   MASS EXCISION Right 08/15/2013   Procedure: RIGHT INDEX EXCISION MASS ;  Surgeon: Franky JONELLE Curia, MD;  Location: [facility];  Service: Orthopedics;  Laterality: Right;   NASAL SEPTUM SURGERY     OOPHORECTOMY Right 2000   REVERSE SHOULDER ARTHROPLASTY Left 12/11/2018   REVERSE SHOULDER ARTHROPLASTY Left 12/11/2018   Procedure: LEFT REVERSE SHOULDER ARTHROPLASTY;  Surgeon: Addie Cordella Hamilton, MD;  Location: [facility] OR;  Service: Orthopedics;  Laterality: Left;   SHOULDER ARTHROSCOPY WITH OPEN ROTATOR CUFF REPAIR AND DISTAL CLAVICLE ACROMINECTOMY Right 11/26/2013   Procedure: RIGHT SHOULDER ARTHROSCOPY WITH MINI OPEN ROTATOR CUFF REPAIR AND DISTAL CLAVICLE RESECTION, SUBACROMIAL DECOMPRESSION, POSSIBLE [facility] PATCH.;  Surgeon: Maude LELON Right, MD;  Location: [facility]  OR;  Service: Orthopedics;  Laterality: Right;   TEE WITHOUT CARDIOVERSION N/A 02/16/2021   Procedure: TRANSESOPHAGEAL ECHOCARDIOGRAM (TEE);  Surgeon: Alveta Aleene PARAS, MD;  Location: [facility] ENDOSCOPY;  Service: Cardiovascular;  Laterality: N/A;   TOTAL KNEE ARTHROPLASTY Right 03/21/2017   TOTAL KNEE ARTHROPLASTY Right 03/21/2017   Procedure: RIGHT TOTAL KNEE ARTHROPLASTY;  Surgeon: Right Maude LELON, MD;  Location: [facility] OR;  Service: Orthopedics;  Laterality: Right;   TOTAL KNEE ARTHROPLASTY Left 11/29/2022   Procedure: LEFT TOTAL KNEE ARTHROPLASTY;  Surgeon: Addie Cordella Hamilton, MD;  Location: [facility] ORS;  Service: Orthopedics;  Laterality: Left;   TUBAL LIGATION      Social History   Socioeconomic History   Marital status: Married    Spouse name: Pharmacist, community   Number of children: 2   Years of education: 12   Highest education level: Not on file  Occupational History  Occupation: Forensic scientist - Human Resources-Retired    Comment: Disability/Retired  Tobacco Use   Smoking status: Never    Passive exposure: Yes   Smokeless tobacco: Never  Vaping Use   Vaping status: Never Used  Substance and Sexual Activity   Alcohol  use: Not Currently   Drug use: No   Sexual activity: Not Currently    Partners: Male    Birth control/protection: Surgical, Post-menopausal    Comment: BTL-1st intercourse 73 yo-More than 5 partners  Other Topics Concern   Not on file  Social History Narrative   Roshawn grew up in [place], [place]. She lives with her husband Lesley). She has 1 daughter Amie), 1 son Berlinda) and 2 stepchildren (Benny & Graylin Bradley). They have 3 dogs ([name], Brandie, [name]) and 3 cats ([place], [place], [place]). She enjoys watching movies and outdoors activities.Exercise - noneCaffeine - varies depending on the day. May have between 1-4 cups daily.  Rare Soda.    Social Drivers of Corporate investment banker Strain: Low Risk  (04/04/2023)   Overall Financial Resource Strain (CARDIA)     Difficulty of Paying Living Expenses: Not hard at all  Food Insecurity: No Food Insecurity (05/01/2023)   Hunger Vital Sign    Worried About Running Out of Food in the Last Year: Never true    Ran Out of Food in the Last Year: Never true  Transportation Needs: No Transportation Needs (05/01/2023)   PRAPARE - Administrator, Civil Service (Medical): No    Lack of Transportation (Non-Medical): No  Physical Activity: Inactive (04/04/2023)   Exercise Vital Sign    Days of Exercise per Week: 0 days    Minutes of Exercise per Session: 0 min  Stress: No Stress Concern Present (04/04/2023)   [employer] of Occupational Health - Occupational Stress Questionnaire    Feeling of Stress : Not at all  Social Connections: Moderately Isolated (04/04/2023)   Social Connection and Isolation Panel    Frequency of Communication with Friends and Family: More than three times a week    Frequency of Social Gatherings with Friends and Family: Three times a week    Attends Religious Services: Never    Active Member of Clubs or Organizations: No    Attends Banker Meetings: Never    Marital Status: Married  Catering manager Violence: Not At Risk (04/04/2023)   Humiliation, Afraid, Rape, and Kick questionnaire    Fear of Current or Ex-Partner: No    Emotionally Abused: No    Physically Abused: No    Sexually Abused: No    Family History  Problem Relation Age of Onset   Hypertension Mother    Heart disease Mother    Diabetes Father    Hypertension Father    Heart disease Father    Stroke Father    Diabetes Maternal Aunt    Diabetes Paternal Grandmother    Hypertension Paternal Grandfather    Stroke Paternal Grandfather      Current Outpatient Medications:    acetaminophen  (TYLENOL ) 500 MG tablet, Take 1,000-1,500 mg by mouth every 8 (eight) hours as needed for moderate pain., Disp: , Rfl:    ALPHA LIPOIC ACID PO, Take 1,400 mg by mouth daily., Disp: , Rfl:     amoxicillin -clavulanate (AUGMENTIN ) 875-125 MG tablet, Take 1 tablet by mouth 2 (two) times daily., Disp: , Rfl:    Ascorbic Acid  (VITAMIN C ) 1000 MG tablet, Take 1,000 mg by mouth daily. With zinc, Disp: ,  Rfl:    aspirin  81 MG chewable tablet, Chew 1 tablet (81 mg total) by mouth 2 (two) times daily., Disp: 60 tablet, Rfl: 0   atenolol -chlorthalidone  (TENORETIC ) 50-25 MG tablet, Take 1 tablet by mouth daily., Disp: 90 tablet, Rfl: 1   b complex vitamins tablet, Take 1 tablet by mouth at bedtime., Disp: , Rfl:    calcium  carbonate (OSCAL) 1500 (600 Ca) MG TABS tablet, Take 600 mg of elemental calcium  by mouth daily., Disp: , Rfl:    Cholecalciferol  (D 5000) 5000 units capsule, Take 5,000 Units by mouth daily., Disp: , Rfl:    Coenzyme Q10 (COQ10) 100 MG CAPS, Take 100 mg by mouth every evening. , Disp: , Rfl:    diclofenac  Sodium (VOLTAREN ) 1 % GEL, Apply 1 Application topically 2 (two) times daily., Disp: , Rfl:    ELDERBERRY PO, Take 250 mg by mouth daily., Disp: , Rfl:    estradiol  (ESTRACE ) 0.1 MG/GM vaginal cream, Place 0.5g nightly for two weeks then twice a week after, Disp: 42.5 g, Rfl: 11   folic acid  (FOLVITE ) 800 MCG tablet, Take 800 mcg by mouth every evening. , Disp: , Rfl:    Glutathione 500 MG CAPS, Take 500 mg by mouth daily., Disp: , Rfl:    ketotifen  (ZADITOR ) 0.025 % ophthalmic solution, Place 2 drops into both eyes 2 (two) times daily as needed (allergies)., Disp: , Rfl:    loperamide (IMODIUM A-D) 2 MG tablet, Take 2-4 mg by mouth 4 (four) times daily as needed for diarrhea or loose stools., Disp: , Rfl:    Melatonin 3 MG TABS, Take 3 mg by mouth at bedtime. , Disp: , Rfl:    METAMUCIL FIBER PO, Take 2 tablets by mouth daily as needed (constipation)., Disp: , Rfl:    methocarbamol  (ROBAXIN ) 500 MG tablet, TAKE 1 TABLET BY MOUTH EVERY 8 HOURS AS NEEDED FOR MUSCLE SPASMS, Disp: 90 tablet, Rfl: 3   Multiple Vitamin (MULTIVITAMIN WITH MINERALS) TABS tablet, Take 1 tablet by mouth  at bedtime., Disp: , Rfl:    Multiple Vitamins-Minerals (ZINC PO), Take 1 Dose by mouth daily. 1 dose = 1 dropper full of liquid zinc, Disp: , Rfl:    OVER THE COUNTER MEDICATION, Take 1 Dose by mouth at bedtime. Lemon Balm, Disp: , Rfl:    OVER THE COUNTER MEDICATION, Take 1 Dose by mouth at bedtime. Cats Claw liquid daily, Disp: , Rfl:    OVER THE COUNTER MEDICATION, Apply 1 Application topically at bedtime. Magnesium  lotion, Disp: , Rfl:    potassium chloride  SA (KLOR-CON  M20) 20 MEQ tablet, Take 1 tablet (20 mEq total) by mouth 2 (two) times daily., Disp: 180 tablet, Rfl: 1   saccharomyces boulardii (FLORASTOR) 250 MG capsule, Take 250 mg by mouth daily., Disp: , Rfl:    simvastatin  (ZOCOR ) 20 MG tablet, Take 1 tablet (20 mg total) by mouth daily., Disp: 90 tablet, Rfl: 1   Trospium  Chloride 60 MG CP24, Take 1 capsule (60 mg total) by mouth daily., Disp: 30 capsule, Rfl: 5   TURMERIC CURCUMIN PO, Take 630 mg by mouth daily., Disp: , Rfl:    diphenoxylate -atropine  (LOMOTIL ) 2.5-0.025 MG tablet, TAKE 1 TABLET BY MOUTH FOUR TIMES A DAY AS NEEDED FOR DIARRHEA OR LOOSE STOOLS (Patient not taking: Reported on 11/13/2023), Disp: 80 tablet, Rfl: 2  Current Facility-Administered Medications:    Romosozumab -aqqg (EVENITY ) 105 MG/1. injection 210 mg, 210 mg, Subcutaneous, Once,    Romosozumab -aqqg (EVENITY ) 105 MG/1. injection 210 mg, 210  mg, Subcutaneous, Once, Glennon Almarie POUR, MD   Romosozumab -aqqg (EVENITY ) 105 MG/1. injection 210 mg, 210 mg, Subcutaneous, Q30 days, Glennon Almarie POUR, MD  Physical exam:  Vitals:   11/13/23 1353  BP: 111/64  Pulse: 89  Resp: 19  Temp: 98.7 F (37.1 C)  TempSrc: Tympanic  SpO2: 97%  Weight: 167 lb 14.4 oz (76.2 kg)  Height: 5' 1 (1.549 m)   Physical Exam Cardiovascular:     Rate and Rhythm: Normal rate and regular rhythm.     Heart sounds: Normal heart sounds.  Pulmonary:     Effort: Pulmonary effort is normal.     Breath sounds:  Normal breath sounds.  Skin:    General: Skin is warm and dry.  Neurological:     Mental Status: She is alert and oriented to person, place, and time.      I have personally reviewed labs listed below:    Latest Ref Rng & Units 08/23/2023    3:07 PM  CMP  Glucose 65 - 99 mg/dL 889   BUN 7 - 25 mg/dL 17   Creatinine 9.39 - 1.00 mg/dL 9.18   Sodium 864 - 853 mmol/L 139   Potassium 3.5 - 5.3 mmol/L 3.8   Chloride 98 - 110 mmol/L 102   CO2 20 - 32 mmol/L 28   Calcium  8.6 - 10.4 mg/dL 9.9   Total Protein 6.1 - 8.1 g/dL 7.1   Total Bilirubin 0.2 - 1.2 mg/dL 0.7   AST 10 - 35 U/L 37   ALT 6 - 29 U/L 24       Latest Ref Rng & Units 11/13/2023    1:32 PM  CBC  WBC 4.0 - 10.5 K/uL 9.3   Hemoglobin 12.0 - 15.0 g/dL 85.5   Hematocrit 63.9 - 46.0 % 43.1   Platelets 150 - 400 K/uL 191      Assessment and plan- Patient is a 73 y.o. female here for routine follow-up of iron  deficiency Anemia   Patient is not presently anemic with an H&H of 14.4/43.1 which is improved as compared to 10.9 in December 2024.  Ferritin levels are 40 and iron  studies otherwise normal.  I am holding off on any IV iron  at this time.  She will continue with oral iron .  CBC ferritin and iron  studies in 4 and 8 months and I will see her back in 8 months   Visit Diagnosis 1. Other iron  deficiency anemia      Dr. Annah Skene, MD, MPH [facility] at [facility] [phone] 11/13/2023 4:33 PM

## 2023-11-16 ENCOUNTER — Ambulatory Visit (INDEPENDENT_AMBULATORY_CARE_PROVIDER_SITE_OTHER): Admitting: Obstetrics and Gynecology

## 2023-11-16 ENCOUNTER — Encounter: Payer: Self-pay | Admitting: Obstetrics and Gynecology

## 2023-11-16 VITALS — BP 116/63 | HR 85

## 2023-11-16 DIAGNOSIS — Z01818 Encounter for other preprocedural examination: Secondary | ICD-10-CM | POA: Diagnosis not present

## 2023-11-16 DIAGNOSIS — N812 Incomplete uterovaginal prolapse: Secondary | ICD-10-CM | POA: Diagnosis not present

## 2023-11-16 DIAGNOSIS — N393 Stress incontinence (female) (male): Secondary | ICD-10-CM | POA: Diagnosis not present

## 2023-11-16 NOTE — Progress Notes (Signed)
 [facility] Return Visit  SUBJECTIVE  History of Present Illness: [name] is a 73 y.o. female seen in follow-up for prolapse and incontinence.   Previously fit with #4 incontinence dish but this feels like it was falling out more or pushing toward the opening. She does want to proceed with surgery. Has been on trospium  for OAB symptoms.   Urodynamic Impression:  1. Sensation was normal; capacity was normal 2. Stress Incontinence was demonstrated at normal pressures; 3. Detrusor Overactivity was demonstrated with leakage. 4. Emptying was normal with a normal PVR, a sustained detrusor contraction present,  abdominal straining not present, normal urethral sphincter activity on EMG.  Past Medical History: Patient  has a past medical history of Arthritis, Arthritis, Cataract, Chronic back pain, Diverticulosis, Elevated cholesterol, GERD (gastroesophageal reflux disease) (06/2004), Headache(784.0), History of colon polyps (2004, 2009), History of hiatal hernia, Hypertension, Microscopic colitis, Mucoid cyst of joint (08/2013), Neuromuscular disorder (HCC), Osteopenia (01/2014), PONV (postoperative nausea and vomiting), Rotator cuff arthropathy, Seasonal allergies, Sleep apnea, Stroke (HCC) (1998), Urge incontinence, Uterine prolapse, and Wrist fracture.   Past Surgical History: She  has a past surgical history that includes Oophorectomy (Right, 2000); Tubal ligation; Back surgery; Gynecologic cryosurgery; Nasal septum surgery; Hysteroscopy with D & C (12/07/2010); Knee arthroscopy (Left, 2008); Gum surgery; Colonoscopy (2004, 2009, 2014); Mass excision (Right, 08/15/2013); lip biopsy; Shoulder arthroscopy with open rotator cuff repair and distal clavicle acrominectomy (Right, 11/26/2013); Finger surgery; Total knee arthroplasty (Right, 03/21/2017); Total knee arthroplasty (Right, 03/21/2017); Cataract extraction; Cataract extraction (Bilateral, 10/2017); Blepharoptosis repair  (Bilateral, 12/14/2017); Joint replacement; Reverse shoulder arthroplasty (Left, 12/11/2018); Reverse shoulder arthroplasty (Left, 12/11/2018); TEE without cardioversion (N/A, 02/16/2021); Bubble study (02/16/2021); Carpal tunnel release (Bilateral); and Total knee arthroplasty (Left, 11/29/2022).   Medications: She has a current medication list which includes the following prescription(s): acetaminophen , alpha-lipoic acid, amoxicillin -clavulanate, vitamin c , aspirin , atenolol -chlorthalidone , b complex vitamins, calcium  carbonate, d 5000, coq10, diclofenac  sodium, diphenoxylate -atropine , elderberry, estradiol , folic acid , glutathione, ketotifen , loperamide, melatonin, fiber, methocarbamol , multivitamin with minerals, multiple vitamins-minerals, OVER THE COUNTER MEDICATION, OVER THE COUNTER MEDICATION, OVER THE COUNTER MEDICATION, potassium chloride  sa, saccharomyces boulardii, simvastatin , trospium  chloride, and turmeric, and the following Facility-Administered Medications: romosozumab -aqqg, romosozumab -aqqg, and romosozumab -aqqg.   Allergies: Patient is allergic to egg-derived products, etodolac, fish-derived products, omeprazole, pineapple, and gluten meal.   Social History: Patient  reports that she has never smoked. She has been exposed to tobacco smoke. She has never used smokeless tobacco. She reports that she does not currently use alcohol . She reports that she does not use drugs.     OBJECTIVE     Physical Exam: Vitals:   11/16/23 1013  BP: 116/63  Pulse: 85    Gen: No apparent distress, A&O x 3.  Detailed Urogynecologic Evaluation:  Pessary was noted to be in place. It was removed and cleaned. Speculum exam shows normal mucosa without abrasions. Attempted to place a 2.5 and 2.25 in [place] but pt could not tolerate placement. Unable to place a #2 donut. A #1 donut was placed. Pt walked in room and felt it was comfortable and stayed in place.   POP-Q   0.5                                             Aa   0.5  Ba   -5.5                                              C    3.5                                            Gh   3                                            Pb   6.5                                            tvl    -2.5                                            Ap   -2.5                                            Bp   -4                                              D        ASSESSMENT AND PLAN    [name] is a 73 y.o. with:  1. Uterovaginal prolapse, incomplete   2. SUI (stress urinary incontinence, female)     Plan for surgery: Exam under anesthesia, anterior repair, sacrospinous hysteropexy, midurethral sling, cystoscopy  - We reviewed the patient's specific anatomic and functional findings, with the assistance of diagrams, and together finalized the above procedure. The planned surgical procedures were discussed along with the surgical risks outlined below, which were also provided on a detailed handout. Additional treatment options including expectant management, conservative management, medical management were discussed where appropriate.  We reviewed the benefits and risks of each treatment option.   General Surgical Risks: For all procedures, there are risks of bleeding, infection, damage to surrounding organs including but not limited to bowel, bladder, blood vessels, ureters and nerves, and need for further surgery if an injury were to occur. These risks are all low with minimally invasive surgery.   There are risks of numbness and weakness at any body site or buttock/rectal pain.  It is possible that baseline pain can be worsened by surgery, either with or without mesh. If surgery is vaginal, there is also a low risk of possible conversion to laparoscopy or open abdominal incision where indicated. Very rare risks include blood transfusion, blood clot, heart attack, pneumonia, or death.   There  is also a risk of short-term postoperative urinary retention with need to use a catheter. About half of patients need to go home from surgery  with a catheter, which is then later removed in the office. The risk of long-term need for a catheter is very low. There is also a risk of worsening of overactive bladder.   Sling: The effectiveness of a midurethral vaginal mesh sling is approximately 85%, and thus, there will be times when you may leak urine after surgery, especially if your bladder is full or if you have a strong cough. There is a balance between making the sling tight enough to treat your leakage but not too tight so that you have long-term difficulty emptying your bladder. A mesh sling will not directly treat overactive bladder/urge incontinence and may worsen it.  There is an FDA safety notification on vaginal mesh procedures for prolapse but NOT mesh slings. We have extensive experience and training with mesh placement and we have close postoperative follow up to identify any potential complications from mesh. It is important to realize that this mesh is a permanent implant that cannot be easily removed. There are rare risks of mesh exposure (2-4%), pain with intercourse (0-7%), and infection (<1%). The risk of mesh exposure if more likely in a woman with risks for poor healing (prior radiation, poorly controlled diabetes, or immunocompromised). The risk of new or worsened chronic pain after mesh implant is more common in women with baseline chronic pain and/or poorly controlled anxiety or depression. Approximately 2-4% of patients will experience longer-term post-operative voiding dysfunction that may require surgical revision of the sling. We also reviewed that postoperatively, her stream may not be as strong as before surgery.   Prolapse (with or without mesh): Risk factors for surgical failure  include things that put pressure on your pelvis and the surgical repair, including obesity, chronic  cough, and heavy lifting or straining (including lifting children or adults, straining on the toilet, or lifting heavy objects such as furniture or anything weighing >25 lbs. Risks of recurrence is 20-30% with vaginal native tissue repair and a less than 10% with sacrocolpopexy with mesh.    - For preop Visit:  She is required to have a visit within 30 days of her surgery.    - Medical clearance: not required  - Anticoagulant use: No - Medicaid Hysterectomy form: No - Accepts blood transfusion: Yes - Expected length of stay: outpatient  Request sent for surgery scheduling.   [name], MD

## 2023-11-20 ENCOUNTER — Telehealth: Payer: Self-pay

## 2023-11-20 NOTE — Telephone Encounter (Signed)
 Patient wanted to speak with surgery scheduler to see what dates are available for her.  Please advise

## 2023-11-22 ENCOUNTER — Other Ambulatory Visit: Payer: Self-pay | Admitting: Internal Medicine

## 2023-11-23 ENCOUNTER — Ambulatory Visit: Admitting: Obstetrics and Gynecology

## 2023-11-23 ENCOUNTER — Encounter: Payer: Self-pay | Admitting: *Deleted

## 2023-12-04 ENCOUNTER — Telehealth: Payer: Self-pay | Admitting: Adult Health

## 2023-12-04 NOTE — Telephone Encounter (Signed)
 Pt called stating that  DME sent PT a 800 dollar bill  for Pt CPAP machine and supplies . PT  states that DME is requesting a re qualification form to be filled out and signed by MD for pt to get new Cpap machine and supplies

## 2023-12-04 NOTE — Telephone Encounter (Signed)
 We do not have anything to do with the billing portion of CPAP. I will send a message to [employer] advising them of the message left for us  and see if someone can contact her to discuss this further. Will wait to see what they say.

## 2023-12-05 NOTE — Telephone Encounter (Signed)
 Followed up with [employer] management team who is still looking into this matter for the pt

## 2023-12-12 ENCOUNTER — Ambulatory Visit: Admitting: Obstetrics and Gynecology

## 2023-12-14 ENCOUNTER — Encounter: Payer: Self-pay | Admitting: Adult Health

## 2023-12-14 ENCOUNTER — Encounter: Payer: Self-pay | Admitting: Orthopedic Surgery

## 2023-12-14 MED ORDER — AMOXICILLIN 500 MG PO TABS: ORAL_TABLET | ORAL | 0 refills | Status: AC

## 2023-12-27 DIAGNOSIS — H43813 Vitreous degeneration, bilateral: Secondary | ICD-10-CM | POA: Diagnosis not present

## 2023-12-27 DIAGNOSIS — H524 Presbyopia: Secondary | ICD-10-CM | POA: Diagnosis not present

## 2023-12-27 DIAGNOSIS — Z961 Presence of intraocular lens: Secondary | ICD-10-CM | POA: Diagnosis not present

## 2024-01-09 ENCOUNTER — Telehealth: Payer: Self-pay

## 2024-01-09 NOTE — Telephone Encounter (Signed)
 Printed and faxed request for surgical clearance.

## 2024-01-09 NOTE — Telephone Encounter (Signed)
-----   Message from [name] sent at 12/28/2023 11:38 AM EDT ----- Regarding: Neurology clearance Pt has surgery on 11/3. Neurology clearance was requested on 7/24 and faxed again on 8/20 but I do not see she has anything in the chart or an appt coming up. Can we call their office to confirm receipt of the clearance request letter? Thanks

## 2024-01-12 ENCOUNTER — Ambulatory Visit (INDEPENDENT_AMBULATORY_CARE_PROVIDER_SITE_OTHER): Admitting: Obstetrics and Gynecology

## 2024-01-12 ENCOUNTER — Encounter: Payer: Self-pay | Admitting: Obstetrics and Gynecology

## 2024-01-12 VITALS — BP 126/78 | HR 89

## 2024-01-12 DIAGNOSIS — N812 Incomplete uterovaginal prolapse: Secondary | ICD-10-CM

## 2024-01-12 DIAGNOSIS — N393 Stress incontinence (female) (male): Secondary | ICD-10-CM

## 2024-01-12 DIAGNOSIS — Z960 Presence of urogenital implants: Secondary | ICD-10-CM | POA: Diagnosis not present

## 2024-01-12 MED ORDER — OXYCODONE HCL 5 MG PO TABS: 5.0000 mg | ORAL_TABLET | ORAL | 0 refills | Status: DC | PRN

## 2024-01-12 MED ORDER — POLYETHYLENE GLYCOL 3350 17 GM/SCOOP PO POWD: 17.0000 g | Freq: Every day | ORAL | 0 refills | Status: AC

## 2024-01-12 MED ORDER — ACETAMINOPHEN 500 MG PO TABS
500.0000 mg | ORAL_TABLET | Freq: Four times a day (QID) | ORAL | 0 refills | Status: AC | PRN
Start: 2024-01-12 — End: ?

## 2024-01-12 NOTE — H&P (Signed)
 [facility] Pre Op H&P   Subjective:     Chief Complaint:  No chief complaint on file.  History of Present Illness: Stacy MARCHESE is a 73 y.o. female with stage II pelvic organ prolapse and stress incontinence  Had a #1 donut placed last visit and previously tried a #4 incontinence dish.   Pessary has been bulging out. Was working for a while. No vaginal bleeding. She is not using the estrogen cream regularly but is using vitamin E more often. She has been on trospium  for OAB symptoms. But she is still getting more of a flood of urine when she wakes up.   Urodynamic Impression:  1. Sensation was normal; capacity was normal 2. Stress Incontinence was demonstrated at normal pressures; 3. Detrusor Overactivity was demonstrated with leakage. 4. Emptying was normal with a normal PVR, a sustained detrusor contraction present,  abdominal straining not present, normal urethral sphincter activity on EMG.  Past Medical History: Patient  has a past medical history of Arthritis, Arthritis, Cataract, Chronic back pain, Diverticulosis, Elevated cholesterol, GERD (gastroesophageal reflux disease) (06/2004), Headache(784.0), History of colon polyps (2004, 2009), History of hiatal hernia, Hypertension, Microscopic colitis, Mucoid cyst of joint (08/2013), Neuromuscular disorder (HCC), Osteopenia (01/2014), PONV (postoperative nausea and vomiting), Rotator cuff arthropathy, Seasonal allergies, Sleep apnea, Stroke (HCC) (1998), Urge incontinence, Uterine prolapse, and Wrist fracture.   Past Surgical History: She  has a past surgical history that includes Oophorectomy (Right, 2000); Tubal ligation; Back surgery; Gynecologic cryosurgery; Nasal septum surgery; Hysteroscopy with D & C (12/07/2010); Knee arthroscopy (Left, 2008); Gum surgery; Colonoscopy (2004, 2009, 2014); Mass excision (Right, 08/15/2013); lip biopsy; Shoulder arthroscopy with open rotator cuff repair and distal clavicle  acrominectomy (Right, 11/26/2013); Finger surgery; Total knee arthroplasty (Right, 03/21/2017); Total knee arthroplasty (Right, 03/21/2017); Cataract extraction; Cataract extraction (Bilateral, 10/2017); Blepharoptosis repair (Bilateral, 12/14/2017); Joint replacement; Reverse shoulder arthroplasty (Left, 12/11/2018); Reverse shoulder arthroplasty (Left, 12/11/2018); TEE without cardioversion (N/A, 02/16/2021); Bubble study (02/16/2021); Carpal tunnel release (Bilateral); and Total knee arthroplasty (Left, 11/29/2022).   Medications: She has a current medication list which includes the following prescription(s): acetaminophen , acetaminophen , alpha-lipoic acid, amoxicillin , amoxicillin -clavulanate, vitamin c , aspirin , atenolol -chlorthalidone , b complex vitamins, calcium  carbonate, d 5000, coq10, diclofenac  sodium, diphenoxylate -atropine , elderberry, estradiol , folic acid , glutathione, ketotifen , loperamide, melatonin, fiber, methocarbamol , multivitamin with minerals, multiple vitamins-minerals, OVER THE COUNTER MEDICATION, OVER THE COUNTER MEDICATION, OVER THE COUNTER MEDICATION, oxycodone , polyethylene glycol powder, potassium chloride  sa, saccharomyces boulardii, simvastatin , trospium  chloride, and turmeric, and the following Facility-Administered Medications: romosozumab -aqqg, romosozumab -aqqg, and romosozumab -aqqg.   Allergies: Patient is allergic to egg-derived products, etodolac, fish-derived products, omeprazole, pineapple, and gluten meal.   Social History: Patient  reports that she has never smoked. She has been exposed to tobacco smoke. She has never used smokeless tobacco. She reports that she does not currently use alcohol . She reports that she does not use drugs.      Objective:    Physical Exam: LMP 02/09/2005  Gen: No apparent distress, A&O x 3.  CV: S1 S2 RRR Lungs: Clear to auscultation bilaterally Abd: soft, nontender  Detailed Urogynecologic Evaluation:  Pelvic Exam: Normal  external female genitalia; Bartholin's and Skene's glands normal in appearance; urethral meatus normal in appearance, no urethral masses or discharge. The pessary was noted to be in place. It was removed and cleaned. Speculum exam revealed no lesions in the vagina. The pessary was replaced. It was comfortable to the patient and fit well.   POP-Q   0.5  Aa   0.5                                           Ba   -5.5                                              C    3.5                                            Gh   3                                            Pb   6.5                                            tvl    -2.5                                            Ap   -2.5                                            Bp   -4                                              D       Assessment/Plan:    The patient is a 73 y.o. year old with stage II POP and SUI scheduled to undergo Exam under anesthesia, anterior repair, sacrospinous hysteropexy, midurethral sling, cystoscopy .     [name], MD

## 2024-01-12 NOTE — Progress Notes (Signed)
 [facility]   Subjective:     Chief Complaint:  Chief Complaint  Patient presents with   Pessary Check    [name] is a 73 y.o. female is here for pessary check.   History of Present Illness: [name] is a 73 y.o. female with stage II pelvic organ prolapse and stress incontinence who presents for a pessary check. Had a #1 donut placed last visit and previously tried a #4 incontinence dish. Has surgery scheduled in November.   Pessary has been bulging out. Was working for a while. No vaginal bleeding. She is not using the estrogen cream regularly but is using vitamin E more often. She has been on trospium  for OAB symptoms. But she is still getting more of a flood of urine when she wakes up.   Past Medical History: Patient  has a past medical history of Arthritis, Arthritis, Cataract, Chronic back pain, Diverticulosis, Elevated cholesterol, GERD (gastroesophageal reflux disease) (06/2004), Headache(784.0), History of colon polyps (2004, 2009), History of hiatal hernia, Hypertension, Microscopic colitis, Mucoid cyst of joint (08/2013), Neuromuscular disorder (HCC), Osteopenia (01/2014), PONV (postoperative nausea and vomiting), Rotator cuff arthropathy, Seasonal allergies, Sleep apnea, Stroke (HCC) (1998), Urge incontinence, Uterine prolapse, and Wrist fracture.   Past Surgical History: She  has a past surgical history that includes Oophorectomy (Right, 2000); Tubal ligation; Back surgery; Gynecologic cryosurgery; Nasal septum surgery; Hysteroscopy with D & C (12/07/2010); Knee arthroscopy (Left, 2008); Gum surgery; Colonoscopy (2004, 2009, 2014); Mass excision (Right, 08/15/2013); lip biopsy; Shoulder arthroscopy with open rotator cuff repair and distal clavicle acrominectomy (Right, 11/26/2013); Finger surgery; Total knee arthroplasty (Right, 03/21/2017); Total knee arthroplasty (Right, 03/21/2017); Cataract extraction; Cataract extraction (Bilateral, 10/2017);  Blepharoptosis repair (Bilateral, 12/14/2017); Joint replacement; Reverse shoulder arthroplasty (Left, 12/11/2018); Reverse shoulder arthroplasty (Left, 12/11/2018); TEE without cardioversion (N/A, 02/16/2021); Bubble study (02/16/2021); Carpal tunnel release (Bilateral); and Total knee arthroplasty (Left, 11/29/2022).   Medications: She has a current medication list which includes the following prescription(s): acetaminophen , acetaminophen , alpha-lipoic acid, amoxicillin , amoxicillin -clavulanate, vitamin c , aspirin , atenolol -chlorthalidone , b complex vitamins, calcium  carbonate, d 5000, coq10, diclofenac  sodium, diphenoxylate -atropine , elderberry, estradiol , folic acid , glutathione, ketotifen , loperamide, melatonin, fiber, methocarbamol , multivitamin with minerals, multiple vitamins-minerals, OVER THE COUNTER MEDICATION, OVER THE COUNTER MEDICATION, OVER THE COUNTER MEDICATION, oxycodone , polyethylene glycol powder, potassium chloride  sa, saccharomyces boulardii, simvastatin , trospium  chloride, and turmeric, and the following Facility-Administered Medications: romosozumab -aqqg, romosozumab -aqqg, and romosozumab -aqqg.   Allergies: Patient is allergic to egg-derived products, etodolac, fish-derived products, omeprazole, pineapple, and gluten meal.   Social History: Patient  reports that she has never smoked. She has been exposed to tobacco smoke. She has never used smokeless tobacco. She reports that she does not currently use alcohol . She reports that she does not use drugs.      Objective:    Physical Exam: BP 126/78   Pulse 89   LMP 02/09/2005  Gen: No apparent distress, A&O x 3.  CV: S1 S2 RRR Lungs: Clear to auscultation bilaterally Abd: soft, nontender  Detailed Urogynecologic Evaluation:  Pelvic Exam: Normal external female genitalia; Bartholin's and Skene's glands normal in appearance; urethral meatus normal in appearance, no urethral masses or discharge. The pessary was noted to be  in place. It was removed and cleaned. Speculum exam revealed no lesions in the vagina. The pessary was replaced. It was comfortable to the patient and fit well.   POP-Q   0.5  Aa   0.5                                           Ba   -5.5                                              C    3.5                                            Gh   3                                            Pb   6.5                                            tvl    -2.5                                            Ap   -2.5                                            Bp   -4                                              D       Assessment/Plan:    Assessment: The patient is a 73 y.o. year old with stage II POP scheduled to undergo Exam under anesthesia, anterior repair, sacrospinous hysteropexy, midurethral sling, cystoscopy .   Plan: - decided to leave the same donut pessary in place for now.  - continue with trospium  for OAB symptoms.   General Surgical Consent: The patient has previously been counseled on alternative treatments, and the decision by the patient and provider was to proceed with the procedure listed above.  For all procedures, there are risks of bleeding, infection, damage to surrounding organs including but not limited to bowel, bladder, blood vessels, ureters and nerves, and need for further surgery if an injury were to occur. These risks are all low with minimally invasive surgery.   There are risks of numbness and weakness at any body site or buttock/rectal pain.  It is possible that baseline pain can be worsened by surgery, either with or without mesh. If surgery is vaginal, there is also a low risk of possible conversion to laparoscopy or open abdominal incision where indicated. Very rare risks include blood transfusion, blood clot, heart attack, pneumonia, or death.   There is also a risk of short-term postoperative urinary retention  with need  to use a catheter. About half of patients need to go home from surgery with a catheter, which is then later removed in the office. The risk of long-term need for a catheter is very low. There is also a risk of worsening of overactive bladder.   Sling: The effectiveness of a midurethral vaginal mesh sling is approximately 85%, and thus, there will be times when you may leak urine after surgery, especially if your bladder is full or if you have a strong cough. There is a balance between making the sling tight enough to treat your leakage but not too tight so that you have long-term difficulty emptying your bladder. A mesh sling will not directly treat overactive bladder/urge incontinence and may worsen it.  There is an FDA safety notification on vaginal mesh procedures for prolapse but NOT mesh slings. We have extensive experience and training with mesh placement and we have close postoperative follow up to identify any potential complications from mesh. It is important to realize that this mesh is a permanent implant that cannot be easily removed. There are rare risks of mesh exposure (2-4%), pain with intercourse (0-7%), and infection (<1%). The risk of mesh exposure if more likely in a woman with risks for poor healing (prior radiation, poorly controlled diabetes, or immunocompromised). The risk of new or worsened chronic pain after mesh implant is more common in women with baseline chronic pain and/or poorly controlled anxiety or depression. Approximately 2-4% of patients will experience longer-term post-operative voiding dysfunction that may require surgical revision of the sling. We also reviewed that postoperatively, her stream may not be as strong as before surgery.    Prolapse (with or without mesh): Risk factors for surgical failure  include things that put pressure on your pelvis and the surgical repair, including obesity, chronic cough, and heavy lifting or straining (including lifting  children or adults, straining on the toilet, or lifting heavy objects such as furniture or anything weighing >25 lbs. Risks of recurrence is 20-30% with vaginal native tissue repair and a less than 10% with sacrocolpopexy with mesh.    We discussed consent for blood products. Risks for blood transfusion include allergic reactions, other reactions that can affect different body organs and managed accordingly, transmission of infectious diseases such as HIV or Hepatitis. However, the blood is screened. Patient consents for blood products.  Pre-operative instructions:  She was instructed to not take Aspirin /NSAIDs x 7days prior to surgery. She may continue her 81mg  ASA. Antibiotic prophylaxis was ordered as indicated.  Catheter use: Patient will go home with foley if needed after post-operative voiding trial.  Post-operative instructions:  She was provided with specific post-operative instructions, including precautions and signs/symptoms for which we would recommend contacting us , in addition to daytime and after-hours contact phone numbers. This was provided on a handout.   Post-operative medications: Prescriptions for tylenol , miralax , and oxycodone  were sent to her pharmacy. Discussed using ibuprofen and tylenol  on a schedule to limit use of narcotics.   Laboratory testing:  no labs needed  Preoperative clearance:  Neurology clearance pending  Post-operative follow-up:  A post-operative appointment will be made for 6 weeks from the date of surgery. If she needs a post-operative nurse visit for a voiding trial, that will be set up after she leaves the hospital.    Patient will call the clinic or use MyChart should anything change or any new issues arise.   [name], MD   Time spent: I spent 30 minutes dedicated to  the care of this patient on the date of this encounter to include pre-visit review of records, face-to-face time with the patient  and post visit documentation and  ordering medication/ testing.

## 2024-01-15 NOTE — Progress Notes (Signed)
New request sent to PCP 

## 2024-01-15 NOTE — Progress Notes (Signed)
 I have evaluated [name] for sleep apnea and she has been compliant with her AutoPap machine.  From the sleep apnea standpoint there is no obvious contraindication to pursuing any necessary surgery.  She has an appointment coming up in our clinic in November.  She was compliant with her CPAP in November 2024 and is scheduled for a routine 1 year follow-up in November 2025 with a nurse practitioner.  For medical clearance, I recommend she be evaluated by her medical doctor and/or cardiology.

## 2024-01-29 ENCOUNTER — Telehealth: Payer: Self-pay | Admitting: Adult Health

## 2024-01-29 DIAGNOSIS — I610 Nontraumatic intracerebral hemorrhage in hemisphere, subcortical: Secondary | ICD-10-CM

## 2024-01-29 DIAGNOSIS — G4453 Primary thunderclap headache: Secondary | ICD-10-CM

## 2024-01-29 NOTE — Telephone Encounter (Signed)
 Pt called to inform MD that pr might have had Mini Stroke the patient states at 4 am she was working up out her sleep  wirh pain in her head like  Pt  was having a major headaches . PT from the  weakness of her body she might have had a mini stroke  and is concern and would like to Discuss what happen with MD or Nurse

## 2024-01-29 NOTE — Telephone Encounter (Signed)
 Please advise patient that in order has been placed to obtain a CT head to ensure no bleed as she does have history of prior hemorrhagic strokes. Would prefer this be completed prior to her surgery. If she should experience this again or especially any new stroke/TIA symptoms, it is highly encouraged that she call 911 immediately for more emergent evaluation.

## 2024-01-29 NOTE — Telephone Encounter (Signed)
 Spoke to pt.  She stated that she had episode this last Sunday around 0400, woke with intense, severe back of head headache.  Lasted about 2 minutes.  Then subsided.  She got up and went to bathroom no weakness, other stroke symptoms.  Today she has had mild headache took tylenol .   She was wearing cpap. She takes 81mg  aspirin .  Hx basal ganglia hemmorrhage. She says that feels similar.   I told her is happens again to call 911 or go to ED for evaluation.  She said she was given clearance previously for surgery of prolapsed bladder. For 01/12/2024.  She has not been to her pcp.  I told her that I would mention to [facility] NP for her recommendation then let her know.  She has not let her surgeon know yet.

## 2024-01-29 NOTE — Telephone Encounter (Signed)
 Relayed to pt and let her know that harlene, NP placed order for CT head wo contrast, due to her hx.  I relayed if happens again to go to ED call 911.  She verbalized understanding. Will let her know about location once hear from our img scheduler.

## 2024-01-29 NOTE — Telephone Encounter (Signed)
 LMVM for pt on phone as well that call to schedule [facility] [phone].  They will do insurance auth then.  Also sent mychart message as well.

## 2024-01-31 ENCOUNTER — Encounter: Payer: Self-pay | Admitting: Oncology

## 2024-01-31 ENCOUNTER — Encounter: Payer: Self-pay | Admitting: Adult Health

## 2024-02-01 ENCOUNTER — Encounter: Admitting: Obstetrics and Gynecology

## 2024-02-01 ENCOUNTER — Ambulatory Visit
Admission: RE | Admit: 2024-02-01 | Discharge: 2024-02-01 | Disposition: A | Discharge status: Home / Self Care | Source: Ambulatory Visit | Attending: Adult Health | Admitting: Adult Health

## 2024-02-01 DIAGNOSIS — I610 Nontraumatic intracerebral hemorrhage in hemisphere, subcortical: Secondary | ICD-10-CM

## 2024-02-01 DIAGNOSIS — G4453 Primary thunderclap headache: Secondary | ICD-10-CM | POA: Diagnosis not present

## 2024-02-05 ENCOUNTER — Encounter: Payer: Self-pay | Admitting: Obstetrics and Gynecology

## 2024-02-05 NOTE — Telephone Encounter (Signed)
 CT head without acute abnormalities. Can proceed with surgical procedure.

## 2024-02-06 ENCOUNTER — Encounter: Payer: Self-pay | Admitting: Obstetrics and Gynecology

## 2024-02-07 ENCOUNTER — Encounter (HOSPITAL_COMMUNITY): Payer: Self-pay | Admitting: Obstetrics and Gynecology

## 2024-02-08 ENCOUNTER — Encounter (HOSPITAL_COMMUNITY): Payer: Self-pay

## 2024-02-08 ENCOUNTER — Encounter (HOSPITAL_COMMUNITY): Payer: Self-pay | Admitting: Obstetrics and Gynecology

## 2024-02-08 NOTE — Progress Notes (Signed)
 Spoke w/ via phone for pre-op interview--- [name] needs dos---- BMP, T&S and CBC per surgeon.        Haley results------ COVID test -----patient states asymptomatic no test needed Arrive at -------1000 NPO after MN NO Solid Food.  Clear liquids from MN until---0900 Pre-Surgery Ensure or G2:  Med rec completed Medications to take morning of surgery ----- Lomotil  and Imodium PRN Diabetic medication -----  GLP1 agonist last dose: GLP1 instructions:  Patient instructed no nail polish to be worn day of surgery Patient instructed to bring photo id and insurance card day of surgery Patient aware to have Driver (ride ) / caregiver    for 24 hours after surgery - Husband [name] Patient Special Instructions -----per surgeon pt to continue ASA pt verbalized understanding. Pre-Op special Instructions -----  Patient verbalized understanding of instructions that were given at this phone interview. Patient denies chest pain, sob, fever, cough at the interview.

## 2024-02-12 ENCOUNTER — Encounter (HOSPITAL_COMMUNITY): Payer: Self-pay | Admitting: Obstetrics and Gynecology

## 2024-02-12 ENCOUNTER — Ambulatory Visit (HOSPITAL_COMMUNITY): Admitting: Anesthesiology

## 2024-02-12 ENCOUNTER — Encounter: Payer: Self-pay | Admitting: Radiology

## 2024-02-12 ENCOUNTER — Encounter (HOSPITAL_COMMUNITY)
Admission: RE | Disposition: A | Discharge status: Home / Self Care | Payer: Self-pay | Source: Home / Self Care | Attending: Obstetrics and Gynecology

## 2024-02-12 ENCOUNTER — Ambulatory Visit (HOSPITAL_COMMUNITY)
Admission: RE | Admit: 2024-02-12 | Discharge: 2024-02-12 | Disposition: A | Discharge status: Home / Self Care | Attending: Obstetrics and Gynecology | Admitting: Obstetrics and Gynecology

## 2024-02-12 ENCOUNTER — Encounter: Payer: Self-pay | Admitting: Obstetrics and Gynecology

## 2024-02-12 DIAGNOSIS — K219 Gastro-esophageal reflux disease without esophagitis: Secondary | ICD-10-CM | POA: Diagnosis not present

## 2024-02-12 DIAGNOSIS — N393 Stress incontinence (female) (male): Secondary | ICD-10-CM | POA: Diagnosis not present

## 2024-02-12 DIAGNOSIS — N812 Incomplete uterovaginal prolapse: Secondary | ICD-10-CM | POA: Insufficient documentation

## 2024-02-12 DIAGNOSIS — I10 Essential (primary) hypertension: Secondary | ICD-10-CM | POA: Insufficient documentation

## 2024-02-12 DIAGNOSIS — Z7722 Contact with and (suspected) exposure to environmental tobacco smoke (acute) (chronic): Secondary | ICD-10-CM | POA: Insufficient documentation

## 2024-02-12 DIAGNOSIS — I69354 Hemiplegia and hemiparesis following cerebral infarction affecting left non-dominant side: Secondary | ICD-10-CM | POA: Insufficient documentation

## 2024-02-12 DIAGNOSIS — G4730 Sleep apnea, unspecified: Secondary | ICD-10-CM | POA: Insufficient documentation

## 2024-02-12 HISTORY — DX: Overactive bladder: N32.81

## 2024-02-12 HISTORY — DX: Noninfective gastroenteritis and colitis, unspecified: K52.9

## 2024-02-12 HISTORY — PX: CYSTOCELE REPAIR: SHX163

## 2024-02-12 HISTORY — DX: Unspecified osteoarthritis, unspecified site: M19.90

## 2024-02-12 HISTORY — PX: CYSTOSCOPY: SHX5120

## 2024-02-12 HISTORY — DX: Presence of urogenital implants: Z96.0

## 2024-02-12 HISTORY — PX: BLADDER SUSPENSION: SHX72

## 2024-02-12 HISTORY — DX: Stress incontinence (female) (male): N39.3

## 2024-02-12 HISTORY — DX: Diaphragmatic hernia without obstruction or gangrene: K44.9

## 2024-02-12 HISTORY — DX: Mixed hyperlipidemia: E78.2

## 2024-02-12 HISTORY — DX: Iron deficiency anemia, unspecified: D50.9

## 2024-02-12 HISTORY — DX: Incomplete uterovaginal prolapse: N81.2

## 2024-02-12 HISTORY — PX: VAGINAL PROLAPSE REPAIR: SHX830

## 2024-02-12 LAB — BASIC METABOLIC PANEL WITH GFR
Anion gap: 14 (ref 5–15)
BUN: 13 mg/dL (ref 8–23)
CO2: 24 mmol/L (ref 22–32)
Calcium: 9.2 mg/dL (ref 8.9–10.3)
Chloride: 96 mmol/L — ABNORMAL LOW (ref 98–111)
Creatinine, Ser: 0.9 mg/dL (ref 0.44–1.00)
GFR, Estimated: >60 mL/min (ref >60)
Glucose, Bld: 86 mg/dL (ref 70–99)
Potassium: 3.1 mmol/L — ABNORMAL LOW (ref 3.5–5.1)
Sodium: 134 mmol/L — ABNORMAL LOW (ref 135–145)

## 2024-02-12 LAB — TYPE AND SCREEN
ABO/RH(D): A NEG
Antibody Screen: NEGATIVE

## 2024-02-12 SURGERY — COLPORRHAPHY, ANTERIOR, FOR CYSTOCELE REPAIR
Anesthesia: General

## 2024-02-12 MED ORDER — EPHEDRINE SULFATE-NACL 50-0.9 MG/10ML-% IV SOSY
PREFILLED_SYRINGE | INTRAVENOUS | Status: DC | PRN
  Administered 2024-02-12: 5 mg via INTRAVENOUS

## 2024-02-12 MED ORDER — ACETAMINOPHEN 500 MG PO TABS
1000.0000 mg | ORAL_TABLET | ORAL | Status: AC
  Administered 2024-02-12: 1000 mg via ORAL

## 2024-02-12 MED ORDER — DROPERIDOL 2.5 MG/ML IJ SOLN: 0.6250 mg | Freq: Once | INTRAMUSCULAR | Status: DC | PRN

## 2024-02-12 MED ORDER — LIDOCAINE-EPINEPHRINE 1 %-1:100000 IJ SOLN
INTRAMUSCULAR | Status: AC
  Filled 2024-02-12: qty 2

## 2024-02-12 MED ORDER — LACTATED RINGERS IV SOLN: INTRAVENOUS | Status: DC

## 2024-02-12 MED ORDER — STERILE WATER FOR IRRIGATION IR SOLN
Status: DC | PRN
Start: 2024-02-12 — End: 2024-02-12
  Administered 2024-02-12: 3000 mL

## 2024-02-12 MED ORDER — KETOROLAC TROMETHAMINE 15 MG/ML IJ SOLN
INTRAMUSCULAR | Status: DC | PRN
  Administered 2024-02-12: 15 mg via INTRAVENOUS

## 2024-02-12 MED ORDER — LIDOCAINE-EPINEPHRINE 1 %-1:100000 IJ SOLN
INTRAMUSCULAR | Status: DC | PRN
  Administered 2024-02-12: 15 mL

## 2024-02-12 MED ORDER — ENOXAPARIN SODIUM 40 MG/0.4ML IJ SOSY
PREFILLED_SYRINGE | INTRAMUSCULAR | Status: AC
  Filled 2024-02-12: qty 0.4

## 2024-02-12 MED ORDER — POVIDONE-IODINE 10 % EX SWAB: 2.0000 | Freq: Once | CUTANEOUS | Status: DC

## 2024-02-12 MED ORDER — PHENAZOPYRIDINE HCL 100 MG PO TABS
200.0000 mg | ORAL_TABLET | ORAL | Status: AC
  Administered 2024-02-12: 200 mg via ORAL

## 2024-02-12 MED ORDER — ROCURONIUM BROMIDE 10 MG/ML (PF) SYRINGE
PREFILLED_SYRINGE | INTRAVENOUS | Status: DC | PRN
  Administered 2024-02-12: 50 mg via INTRAVENOUS

## 2024-02-12 MED ORDER — ENOXAPARIN SODIUM 40 MG/0.4ML IJ SOSY
40.0000 mg | PREFILLED_SYRINGE | INTRAMUSCULAR | Status: AC
  Administered 2024-02-12: 40 mg via SUBCUTANEOUS

## 2024-02-12 MED ORDER — METOCLOPRAMIDE HCL 5 MG/ML IJ SOLN
INTRAMUSCULAR | Status: DC | PRN
Start: 2024-02-12 — End: 2024-02-12
  Administered 2024-02-12: 10 mg via INTRAVENOUS

## 2024-02-12 MED ORDER — SODIUM CHLORIDE 0.9 % IR SOLN
Status: DC | PRN
  Administered 2024-02-12: 1000 mL via INTRAVESICAL

## 2024-02-12 MED ORDER — ACETAMINOPHEN 500 MG PO TABS
ORAL_TABLET | ORAL | Status: DC
Start: 2024-02-12 — End: 2024-02-12
  Filled 2024-02-12: qty 2

## 2024-02-12 MED ORDER — CEFAZOLIN SODIUM-DEXTROSE 2-4 GM/100ML-% IV SOLN
2.0000 g | INTRAVENOUS | Status: AC
  Administered 2024-02-12: 2 g via INTRAVENOUS

## 2024-02-12 MED ORDER — PHENYLEPHRINE HCL-NACL 20-0.9 MG/250ML-% IV SOLN
INTRAVENOUS | Status: DC | PRN
  Administered 2024-02-12 (×6): 80 ug via INTRAVENOUS

## 2024-02-12 MED ORDER — METOCLOPRAMIDE HCL 5 MG/ML IJ SOLN
INTRAMUSCULAR | Status: AC
  Filled 2024-02-12: qty 2

## 2024-02-12 MED ORDER — ONDANSETRON HCL 4 MG/2ML IJ SOLN
INTRAMUSCULAR | Status: AC
  Filled 2024-02-12: qty 2

## 2024-02-12 MED ORDER — KETOROLAC TROMETHAMINE 30 MG/ML IJ SOLN
INTRAMUSCULAR | Status: AC
  Filled 2024-02-12: qty 1

## 2024-02-12 MED ORDER — 0.9 % SODIUM CHLORIDE (POUR BTL) OPTIME
TOPICAL | Status: DC | PRN
  Administered 2024-02-12: 1000 mL

## 2024-02-12 MED ORDER — HYDROMORPHONE HCL 1 MG/ML IJ SOLN: 0.2500 mg | INTRAMUSCULAR | Status: DC | PRN

## 2024-02-12 MED ORDER — CHLORHEXIDINE GLUCONATE 0.12 % MT SOLN
OROMUCOSAL | Status: AC
  Filled 2024-02-12: qty 15

## 2024-02-12 MED ORDER — OXYCODONE HCL 5 MG/5ML PO SOLN: 5.0000 mg | Freq: Once | ORAL | Status: DC | PRN

## 2024-02-12 MED ORDER — CHLORHEXIDINE GLUCONATE 0.12 % MT SOLN
15.0000 mL | Freq: Once | OROMUCOSAL | Status: AC
  Administered 2024-02-12: 15 mL via OROMUCOSAL

## 2024-02-12 MED ORDER — CEFAZOLIN SODIUM-DEXTROSE 2-4 GM/100ML-% IV SOLN
INTRAVENOUS | Status: AC
Start: 2024-02-12 — End: 2024-02-12
  Filled 2024-02-12: qty 100

## 2024-02-12 MED ORDER — ORAL CARE MOUTH RINSE: 15.0000 mL | Freq: Once | OROMUCOSAL | Status: AC

## 2024-02-12 MED ORDER — FENTANYL CITRATE (PF) 250 MCG/5ML IJ SOLN
INTRAMUSCULAR | Status: AC
  Filled 2024-02-12: qty 5

## 2024-02-12 MED ORDER — ONDANSETRON HCL 4 MG/2ML IJ SOLN
INTRAMUSCULAR | Status: DC | PRN
  Administered 2024-02-12: 4 mg via INTRAVENOUS

## 2024-02-12 MED ORDER — GLYCOPYRROLATE 0.2 MG/ML IJ SOLN
INTRAMUSCULAR | Status: DC | PRN
  Administered 2024-02-12: .2 mg via INTRAVENOUS

## 2024-02-12 MED ORDER — DEXAMETHASONE SOD PHOSPHATE PF 10 MG/ML IJ SOLN
INTRAMUSCULAR | Status: DC | PRN
  Administered 2024-02-12: 5 mg via INTRAVENOUS

## 2024-02-12 MED ORDER — SUGAMMADEX SODIUM 200 MG/2ML IV SOLN
INTRAVENOUS | Status: DC | PRN
  Administered 2024-02-12: 200 mg via INTRAVENOUS

## 2024-02-12 MED ORDER — ROCURONIUM BROMIDE 10 MG/ML (PF) SYRINGE
PREFILLED_SYRINGE | INTRAVENOUS | Status: AC
  Filled 2024-02-12: qty 10

## 2024-02-12 MED ORDER — LIDOCAINE 2% (20 MG/ML) 5 ML SYRINGE
INTRAMUSCULAR | Status: DC | PRN
  Administered 2024-02-12: 70 mg via INTRAVENOUS

## 2024-02-12 MED ORDER — PHENAZOPYRIDINE HCL 100 MG PO TABS
ORAL_TABLET | ORAL | Status: AC
  Filled 2024-02-12: qty 2

## 2024-02-12 MED ORDER — LIDOCAINE 2% (20 MG/ML) 5 ML SYRINGE
INTRAMUSCULAR | Status: AC
  Filled 2024-02-12: qty 5

## 2024-02-12 MED ORDER — PHENYLEPHRINE 80 MCG/ML (10ML) SYRINGE FOR IV PUSH (FOR BLOOD PRESSURE SUPPORT)
PREFILLED_SYRINGE | INTRAVENOUS | Status: AC
  Filled 2024-02-12: qty 10

## 2024-02-12 MED ORDER — PROPOFOL 10 MG/ML IV BOLUS
INTRAVENOUS | Status: DC | PRN
  Administered 2024-02-12: 110 mg via INTRAVENOUS

## 2024-02-12 MED ORDER — FENTANYL CITRATE (PF) 250 MCG/5ML IJ SOLN
INTRAMUSCULAR | Status: DC | PRN
  Administered 2024-02-12: 100 ug via INTRAVENOUS

## 2024-02-12 MED ORDER — OXYCODONE HCL 5 MG PO TABS: 5.0000 mg | ORAL_TABLET | Freq: Once | ORAL | Status: DC | PRN

## 2024-02-12 SURGICAL SUPPLY — 45 items
BLADE CLIPPER SENSICLIP SURGIC (BLADE) ×1 IMPLANT
BLADE SURG 15 STRL LF DISP TIS (BLADE) ×1 IMPLANT
CABLE HIGH FREQUENCY MONO STRZ (ELECTRODE) IMPLANT
DERMABOND ADVANCED .7 DNX12 (GAUZE/BANDAGES/DRESSINGS) ×1 IMPLANT
DEVICE CAPIO SLIM SINGLE (INSTRUMENTS) IMPLANT
ELECTRODE REM PT RTRN 9FT ADLT (ELECTROSURGICAL) IMPLANT
ELECTRODE URO BUGBEE 5F (MISCELLANEOUS) IMPLANT
GAUZE 4X4 16PLY ~~LOC~~+RFID DBL (SPONGE) IMPLANT
GLOVE BIOGEL PI IND STRL 6.5 (GLOVE) ×1 IMPLANT
GLOVE BIOGEL PI IND STRL 7.0 (GLOVE) ×1 IMPLANT
GLOVE ECLIPSE 6.0 STRL STRAW (GLOVE) ×1 IMPLANT
GOWN STRL REUS W/ TWL LRG LVL3 (GOWN DISPOSABLE) ×1 IMPLANT
GOWN STRL REUS W/TWL LRG LVL3 (GOWN DISPOSABLE) ×1 IMPLANT
HIBICLENS CHG 4% 4OZ BTL (MISCELLANEOUS) ×1 IMPLANT
HOLDER FOLEY CATH W/STRAP (MISCELLANEOUS) ×1 IMPLANT
KIT TURNOVER KIT B (KITS) ×1 IMPLANT
MANIFOLD NEPTUNE II (INSTRUMENTS) ×1 IMPLANT
NDL HYPO 22X1.5 SAFETY MO (MISCELLANEOUS) ×1 IMPLANT
NDL MAYO 6 CRC TAPER PT (NEEDLE) IMPLANT
NEEDLE HYPO 22X1.5 SAFETY MO (MISCELLANEOUS) ×1 IMPLANT
NEEDLE MAYO 6 CRC TAPER PT (NEEDLE) ×1 IMPLANT
PACK CYSTO (CUSTOM PROCEDURE TRAY) ×1 IMPLANT
PACK VAGINAL WOMENS (CUSTOM PROCEDURE TRAY) ×1 IMPLANT
PAD OB MATERNITY 11 LF (PERSONAL CARE ITEMS) ×1 IMPLANT
RETRACTOR LONE STAR DISPOSABLE (INSTRUMENTS) ×1 IMPLANT
RETRACTOR STAY HOOK 5MM (MISCELLANEOUS) ×1 IMPLANT
SET CYSTO IRRIGATION (SET/KITS/TRAYS/PACK) ×1 IMPLANT
SET IRRIG Y TYPE TUR BLADDER L (SET/KITS/TRAYS/PACK) ×1 IMPLANT
SLEEVE SCD COMPRESS KNEE MED (STOCKING) ×1 IMPLANT
SLING ADVANTAGE FIT TRANVAG (Sling) IMPLANT
SOLN 0.9% NACL POUR BTL 1000ML (IV SOLUTION) ×1 IMPLANT
SPIKE FLUID TRANSFER (MISCELLANEOUS) ×1 IMPLANT
SUCTION TUBE FRAZIER 10FR DISP (SUCTIONS) ×1 IMPLANT
SURGIFLO W/THROMBIN 8M KIT (HEMOSTASIS) IMPLANT
SUT ABS MONO DBL WITH NDL 48IN (SUTURE) IMPLANT
SUT PDS PLUS AB 0 CT-2 (SUTURE) IMPLANT
SUT VIC AB 0 CT1 27XBRD ANBCTR (SUTURE) IMPLANT
SUT VIC AB 2-0 SH 27XBRD (SUTURE) ×1 IMPLANT
SUT VICRYL 2-0 SH 8X27 (SUTURE) ×1 IMPLANT
SYR BULB EAR ULCER 3OZ GRN STR (SYRINGE) ×1 IMPLANT
TOWEL GREEN STERILE (TOWEL DISPOSABLE) ×1 IMPLANT
TOWEL GREEN STERILE FF (TOWEL DISPOSABLE) ×1 IMPLANT
TRAY FOLEY W/BAG SLVR 14FR (SET/KITS/TRAYS/PACK) ×1 IMPLANT
TRAY FOLEY W/BAG SLVR 14FR LF (SET/KITS/TRAYS/PACK) ×1 IMPLANT
WATER STERILE IRR 3000ML UROMA (IV SOLUTION) IMPLANT

## 2024-02-12 NOTE — Anesthesia Procedure Notes (Signed)
 Procedure Name: Intubation Date/Time: 02/12/2024 11:42 AM  Performed by: Jerrye Herring, CRNAPre-anesthesia Checklist: Patient identified, Emergency Drugs available, Suction available and Patient being monitored Patient Re-evaluated:Patient Re-evaluated prior to induction Oxygen Delivery Method: Circle system utilized Preoxygenation: Pre-oxygenation with 100% oxygen Induction Type: IV induction Ventilation: Mask ventilation without difficulty Laryngoscope Size: Mac Grade View: Grade I Tube type: Oral Number of attempts: 1 Airway Equipment and Method: Stylet and Oral airway Placement Confirmation: ETT inserted through vocal cords under direct vision, positive ETCO2 and breath sounds checked- equal and bilateral Secured at: 23 cm Tube secured with: Tape Dental Injury: Teeth and Oropharynx as per pre-operative assessment  Comments: Atraumatic Intubation

## 2024-02-12 NOTE — Anesthesia Postprocedure Evaluation (Signed)
 Anesthesia Post Note  Patient: Inocente KATHEE Hoh  Procedure(s) Performed: COLPORRHAPHY, ANTERIOR, FOR CYSTOCELE REPAIR SACROSPINOUS HYSTEROPEXY CREATION, URETHRAL SLING, RETROPUBIC APPROACH, USING POLYPROPYLENE TAPE CYSTOSCOPY, Bladder Biopsy and fulgeration     Patient location during evaluation: PACU Anesthesia Type: General Level of consciousness: sedated and patient cooperative Pain management: pain level controlled Vital Signs Assessment: post-procedure vital signs reviewed and stable Respiratory status: spontaneous breathing Cardiovascular status: stable Anesthetic complications: no   There were no known notable events for this encounter.  Last Vitals:  Vitals:   02/12/24 1315 02/12/24 1333  BP: 114/73 115/89  Pulse: 86 76  Resp: 13 18  Temp:  (!) 36.4 C  SpO2: 93% 93%    Last Pain:  Vitals:   02/12/24 1333  TempSrc:   PainSc: 0-No pain                 Norleen Pope

## 2024-02-12 NOTE — Anesthesia Preprocedure Evaluation (Addendum)
 Anesthesia Evaluation  Patient identified by MRN, date of birth, ID band Patient awake    Reviewed: Allergy & Precautions, NPO status , Patient's Chart, lab work & pertinent test results  History of Anesthesia Complications (+) PONV and history of anesthetic complications  Airway Mallampati: II  TM Distance: >3 FB Neck ROM: Full    Dental no notable dental hx. (+) Missing,    Pulmonary sleep apnea and Continuous Positive Airway Pressure Ventilation , neg recent URI   Pulmonary exam normal breath sounds clear to auscultation       Cardiovascular hypertension, Pt. on medications Normal cardiovascular exam Rhythm:Regular Rate:Normal  TEE 2022  1. Left ventricular ejection fraction, by estimation, is 60 to 65%. The  left ventricle has normal function.   2. Right ventricular systolic function is normal. The right ventricular  size is normal.   3. No left atrial/left atrial appendage thrombus was detected.   4. The mitral valve is normal in structure. Trivial mitral valve  regurgitation.   5. The aortic valve is tricuspid. Aortic valve regurgitation is trivial.  No aortic stenosis is present.   6. There is mild (Grade II) plaque.   7. Agitated saline contrast bubble study was negative, with no evidence  of any interatrial shunt.   TTE 2022  1. Left ventricular ejection fraction, by estimation, is 65 to 70%. The  left ventricle has normal function. The left ventricle has no regional  wall motion abnormalities. Left ventricular diastolic parameters were  normal.   2. Right ventricular systolic function is normal. The right ventricular  size is normal. There is normal pulmonary artery systolic pressure.   3. Left atrial size was mildly dilated.   4. The mitral valve is normal in structure. Mild mitral valve  regurgitation. No evidence of mitral stenosis.   5. The aortic valve is tricuspid. Aortic valve regurgitation is not   visualized. No aortic stenosis is present.   6. The inferior vena cava is normal in size with greater than 50%  respiratory variability, suggesting right atrial pressure of 3 mmHg.     Neuro/Psych  Headaches CVA (left sided weakness), Residual Symptoms    GI/Hepatic Neg liver ROS, hiatal hernia,GERD  Controlled,,  Endo/Other  negative endocrine ROS    Renal/GU negative Renal ROS     Musculoskeletal  (+) Arthritis ,    Abdominal  (+) + obese  Peds  Hematology  (+) Blood dyscrasia, anemia   Anesthesia Other Findings   Reproductive/Obstetrics                              Anesthesia Physical Anesthesia Plan  ASA: 3  Anesthesia Plan: General   Post-op Pain Management: Tylenol  PO (pre-op)*   Induction:   PONV Risk Score and Plan: 4 or greater and Treatment may vary due to age or medical condition, Ondansetron  and Dexamethasone   Airway Management Planned: Oral ETT  Additional Equipment: None  Intra-op Plan:   Post-operative Plan: Extubation in OR  Informed Consent: I have reviewed the patients History and Physical, chart, labs and discussed the procedure including the risks, benefits and alternatives for the proposed anesthesia with the patient or authorized representative who has indicated his/her understanding and acceptance.     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  Pt was evaluated by cardiologist Dr. Lonni following nontraumatic ICH in November of 2022. No afib seen on monitor, no shunt seen on TEE. She was last seen 04/19/21 and doing well at that time, recommended to continue current medical management and followup with cardiology on an as needed basis.   Preop lab reviewed, mild anemia with HGB 10.8, otherwise  unremarkable.   EKG 11/11/22: NSR with sinus arrhythmia, rate 73.  Event monitor 03/2021: ~28 days of data recorded on [employer] monitor. Patient had a min HR of 50 bpm, max HR of 126 bpm, and avg HR of 77 bpm. Predominant underlying rhythm was Sinus Rhythm. No VT, SVT, atrial fibrillation, high degree block, or pauses noted. Isolated atrial and ventricular ectopy was rare (<5%). There were 4 triggered events, sinus rhythm. No significant arrhythmias detected.   TEE 02/16/21: 1. Left ventricular ejection fraction, by estimation, is 60 to 65%. The  left ventricle has normal function.  2. Right ventricular systolic function is normal. The right ventricular  size is normal.  3. No left atrial/left atrial appendage thrombus was detected.  4. The mitral valve is normal in structure. Trivial mitral valve  regurgitation.  5. The aortic valve is tricuspid. Aortic valve regurgitation is trivial.  No aortic stenosis is present.  6. There is mild (Grade II) plaque.  7. Agitated saline contrast bubble study was negative, with no evidence  of any interatrial shunt.   TTE 02/14/21: 1. Left ventricular ejection fraction, by estimation, is 65 to 70%. The  left ventricle has normal function. The left ventricle has no regional  wall motion abnormalities. Left ventricular diastolic parameters were  normal.  2. Right ventricular systolic function is normal. The right ventricular  size is normal. There is normal pulmonary artery systolic pressure.  3. Left atrial size was mildly dilated.  4. The mitral valve is normal in structure. Mild mitral valve  regurgitation. No evidence of mitral stenosis.  5. The aortic valve is tricuspid. Aortic valve regurgitation is not  visualized. No aortic stenosis is present.  6. The inferior vena cava is normal in size with greater than 50%  respiratory variability, suggesting right atrial pressure of 3 mmHg.   )         Anesthesia Quick Evaluation

## 2024-02-12 NOTE — Transfer of Care (Signed)
 Immediate Anesthesia Transfer of Care Note  Patient: [name]  Procedure(s) Performed: COLPORRHAPHY, ANTERIOR, FOR CYSTOCELE REPAIR SACROSPINOUS HYSTEROPEXY CREATION, URETHRAL SLING, RETROPUBIC APPROACH, USING POLYPROPYLENE TAPE CYSTOSCOPY, Bladder Biopsy and fulgeration  Patient Location: PACU  Anesthesia Type:General  Level of Consciousness: awake and drowsy  Airway & Oxygen Therapy: Patient Spontanous Breathing and Patient connected to face mask oxygen  Post-op Assessment: Report given to RN and Patient moving all extremities X 4  Post vital signs: Reviewed  Last Vitals:  Vitals Value Taken Time  BP 119/67 02/12/24 13:03  Temp 36.5 C 02/12/24 13:03  Pulse 86 02/12/24 13:04  Resp 22 02/12/24 13:04  SpO2 95 % 02/12/24 13:04  Vitals shown include unfiled device data.  Last Pain:  Vitals:   02/12/24 1023  TempSrc: Oral  PainSc: 5       Patients Stated Pain Goal: 5 (02/12/24 1023)  Complications: There were no known notable events for this encounter.

## 2024-02-12 NOTE — Op Note (Addendum)
 Operative Note  Preoperative Diagnosis: anterior vaginal prolapse, uterovaginal prolapse, incomplete, and stress urinary incontinence  Postoperative Diagnosis: same  Procedures performed:  Anterior repair, sacrospinous hysteropexy, midurethral sling, cystoscopy, bladder biopsy  Implants:  Implant Name Type Inv. Item Serial No. Manufacturer Lot No. LRB No. Used Action  VALORIE CAROLENE BLUSH [facility] - [id number] Sling SLING [employer] [id number] N/A 1 Implanted    Attending Surgeon: Rosaline Caper, MD  Assistant: Jorene Moats, PA  Anesthesia: General endotracheal  Findings: 1. On vaginal exam, stage II prolapse present  2. On cystoscopy, normal bladder and urethral mucosa without injury or lesion. Brisk bilateral ureteral efflux present.    Specimens:  ID Type Source Tests Collected by Time Destination  1 : bladder lesion Tissue PATH Soft tissue SURGICAL PATHOLOGY Caper Rosaline SAILOR, MD 02/12/2024 1238     Estimated blood loss: 50 mL  IV fluids: 100 mL  Urine output: 150 mL  Complications: none  Procedure in Detail:  After informed consent was obtained, the patient was taken to the operating room where anesthesia was induced and found to be adequate. She was placed in dorsal lithotomy position, taking care to avoid any traction on the extremities, and then prepped and draped in the usual sterile fashion. A self-retaining lonestar retractor was placed using four elastic blue stays.  After a foley catheter was inserted into the urethra, the location of the midurethra was palpated. Two Allis clamps were along the anterior vaginal wall defect. 1% lidocaine  with epinephrine  was injected into the vaginal mucosa.  A vertical incision was made between these two Allis clamps with a 15 blade scalpel.  Allis clamps were placed along this incision and Metzenbaum scissors were used to undermine the vaginal mucosa along the incision.  The vaginal mucosa was  then sharply dissected off to the vesicovaginal septum bilaterally to the level of the pubic rami.    For the sacrospinous ligament fixation (SSLF), the ischial spine was accessed on the right side via dissection with Metzenbaum scissors and blunt dissection.  The sacrospinous ligament was palpated. Two 0 PDS suture was then placed at the sacrospinous ligament two fingerbreadths medial to the ischial spine, in order to avoid the pudendal neurovascular bundle, using a Capio needle driver.  The PDS suture was attached to the vaginal epithelium on the ipsilateral side of the vaginal apex and held. Anterior plication of the vesicovaginal septum was then performed using mattress sutures of 2-0 Vicryl. The vaginal mucosal edges were trimmed and the incision reapproximated with 2-0 Vicryl in a running fashion. The SSLF suture was then tied down with excellent support of the anterior and apical vagina.  The retropubic midurethral sling was performed next. Two Allis clamps were placed lateral to the location of the midurethra. 1% lidocaine  with epinephrine  was injected into the vaginal mucosa and laterally.  Using a 15 blade scapel a small vertical incision was made in the vaginal mucosa. Metzenbaum scissors were used to dissect the vaginal mucosa off of the underlying muscularis and to create the periurethral tunnels. The trocar and attached sling were introduced into the right side of the vaginal incision, just inferior to the pubic symphysis on the right side. The trocar was guided through the endopelvic fascia and directly behind the pubic symphysis through the retropubic space, vertically.  The trocar was guided out through the suprapubic region, 2 fingerbreadths lateral to midline at the level of the pubic symphysis on the ipsilateral side. The trocar was similarly placed  on the left side.  The Foley catheter was removed.  A 70-degree cystoscope was introduced, and 360-degree inspection revealed no trauma or  trocars in the bladder, with bilateral ureteral efflux.  There was a polypoid area on the left bladder dome at 1 o'clock. Trabeculations were also noted. The bladder was drained and the cystoscope was removed.  The Foley catheter was reinserted. The sling was brought to lie beneath the mid-urethra.  An empty needle driver was placed behind the sling to ensure a tension free placement. The trocars were removed.  The plastic sheath was removed from the sling and the distal ends of the sling were trimmed just below the level of the skin incisions. The skin incisions were closed with dermabond.  Hemostasis was noted.  Tension-free positioning of the sling was confirmed.  The vaginal incision was closed with 2-0 vicryl.   The foley was again removed and the cystoscope was then reinserted with sterile water . The polypoid area at 1 o'clock at the dome was biopsied with cup forceps. The area was then cauterized with a bugbee. Hemostasis was achieved. The bladder was drained, the cystoscope was removed and the foley was replaced.    The vagina was copiously irrigated.  Hemostasis was noted.  Vaginal packing was not placed.  A rectal examination was normal and confirmed no sutures within the rectum. The patient tolerated the procedure well.  She was awakened from anesthesia and transferred to the recovery room in stable condition. All counts were correct x 2.    [name], MD

## 2024-02-12 NOTE — Discharge Instructions (Signed)
 POST OPERATIVE INSTRUCTIONS ? ?General Instructions ?Recovery (not bed rest) will last approximately 6 weeks ?Walking is encouraged, but refrain from strenuous exercise/ housework/ heavy lifting. ?No lifting >10lbs  ?Nothing in the vagina- NO intercourse, tampons or douching ?Bathing:  Do not submerge in water (NO swimming, bath, hot tub, etc) until after your postop visit. You can shower starting the day after surgery.  ?No driving until you are not taking narcotic pain medicine and until your pain is well enough controlled that you can slam on the breaks or make sudden movements if needed.  ? ?Taking your medications ?Please take your acetaminophen and ibuprofen on a schedule for the first 48 hours. Take 600mg  ibuprofen, then take 500mg  acetaminophen 3 hours later, then continue to alternate ibuprofen and acetaminophen. That way you are taking each type of medication every 6 hours. ?Take the prescribed narcotic (oxycodone, tramadol, etc) as needed, with a maximum being every 4 hours.  ?Take a stool softener daily to keep your stools soft and preventing you from straining. If you have diarrhea, you decrease your stool softener. This is explained more below. We have prescribed you Miralax. ? ?Reasons to Call the Nurse (see last page for phone numbers) ?Heavy Bleeding (changing your pad every 1-2 hours) ?Persistent nausea/vomiting ?Fever (100.4 degrees or more) ?Incision problems (pus or other fluid coming out, redness, warmth, increased pain) ? ?Things to Expect After Surgery ?Mild to Moderate pain is normal during the first day or two after surgery. If prescribed, take Ibuprofen or Tylenol first and use the stronger medicine for ?break-through? pain. You can overlap these medicines because they work differently.  ? ?Constipation  ? ?To Prevent Constipation:  Eat a well-balanced diet including protein, grains, fresh fruit and vegetables.  Drink plenty of fluids. Walk regularly.  Depending on specific instructions  from your physician: take Miralax daily and additionally you can add a stool softener (colace/ docusate) and fiber supplement. Continue as long as you're on pain medications.  ? ?To Treat Constipation:  If you do not have a bowel movement in 2 days after surgery, you can take 2 Tbs of Milk of Magnesia 1-2 times a day until you have a bowel movement. If diarrhea occurs, decrease the amount or stop the laxative. If no results with Milk of Magnesia, you can drink a bottle of magnesium citrate which you can purchase over the counter. ? ?Fatigue:  This is a normal response to surgery and will improve with time.  Plan frequent rest periods throughout the day. ? ?Gas Pain:  This is very common but can also be very painful! Drink warm liquids such as herbal teas, bouillon or soup. Walking will help you pass more gas.  Mylicon or Gas-X can be taken over the counter. ? ?Leaking Urine:  Varying amounts of leakage may occur after surgery.  This should improve with time. Your bladder needs at least 3 months to recover from surgery. If you leak after surgery, be sure to mention this to your doctor at your post-op visit. If you were taking medications for overactive bladder prior to surgery, be sure to restart the medications immediately after surgery. ? ?Incisions: If you have incisions on your abdomen, the skin glue will dissolve on its own over time. It is ok to gently rinse with soap and water over these incisions but do not scrub. ? ?Catheter ?Approximately 50% of patients are unable to urinate after surgery and need to go home with a catheter. This allows your bladder to  rest so it can return to full function. If you go home with a catheter, the office will call to set up a voiding trial a few days after surgery. For most patients, by this visit, they are able to urinate on their own. Long term catheter use is rare.  ? ?Return to Work ? ?As work demands and recovery times vary widely, it is hard to predict when you will want  to return to work. If you have a desk job with no strenuous physical activity, and if you would like to return sooner than generally recommended, discuss this with your provider or call our office.  ? ?Post op concerns ? ?For non-emergent issues, please call the Urogynecology Nurse. Please leave a message and someone will contact you within one business day.  You can also send a message through MyChart.  ? ?AFTER HOURS (After 5:00 PM and on weekends):  For urgent matters that cannot wait until the next business day. Call our office [phone] and connect to the doctor on call.  ?Please reserve this for important issues. ? ? ?**FOR ANY TRUE EMERGENCY ISSUES CALL 911 OR GO TO THE NEAREST EMERGENCY ROOM.** Please inform our office or the doctor on call of any emergency.   ? ? ?APPOINTMENTS: Call [phone] ? ?

## 2024-02-12 NOTE — Interval H&P Note (Signed)
 History and Physical Interval Note:  02/12/2024 10:48 AM  [name]  has presented today for surgery, with the diagnosis of incomplete uterovaginal prolapse.  The various methods of treatment have been discussed with the patient and family. After consideration of risks, benefits and other options for treatment, the patient has consented to  Procedure(s) with comments: COLPORRHAPHY, ANTERIOR, FOR CYSTOCELE REPAIR (N/A) - Total time 1.5 hours SUSPENSION, VAGINAL VAULT (N/A) - sacrospinuous hysteropexy (sacrospinuous ligament fixation) CREATION, URETHRAL SLING, RETROPUBIC APPROACH, USING POLYPROPYLENE TAPE (N/A) CYSTOSCOPY (N/A) as a surgical intervention.  The patient's history has been reviewed, patient examined, no change in status, stable for surgery.  I have reviewed the patient's chart and labs.  Questions were answered to the patient's satisfaction.     [name]

## 2024-02-13 ENCOUNTER — Ambulatory Visit

## 2024-02-13 ENCOUNTER — Encounter (HOSPITAL_COMMUNITY): Payer: Self-pay | Admitting: Obstetrics and Gynecology

## 2024-02-13 ENCOUNTER — Telehealth: Payer: Self-pay | Admitting: Obstetrics and Gynecology

## 2024-02-13 VITALS — BP 110/71 | HR 85

## 2024-02-13 DIAGNOSIS — R339 Retention of urine, unspecified: Secondary | ICD-10-CM

## 2024-02-13 DIAGNOSIS — Z48816 Encounter for surgical aftercare following surgery on the genitourinary system: Secondary | ICD-10-CM

## 2024-02-13 LAB — SURGICAL PATHOLOGY

## 2024-02-13 NOTE — Patient Instructions (Signed)
 Please keep all scheduled follow ups as scheduled.  We will see you on 02-16-2024 at 11:00am  It was a pleasure to see you today!  Thank you for trusting me with your care!

## 2024-02-13 NOTE — Telephone Encounter (Signed)
 Called and spoke to patient and she does not feel she is emptying her bladder well. She is not having significant pain. Reports she is taking tylenol . The main concern is not urinating equivalent to what she is drinking. Patient needs to come in for PVR.

## 2024-02-13 NOTE — Telephone Encounter (Signed)
 [name] underwent Anterior repair, sacrospinous hysteropexy, midurethral sling, cystoscopy, bladder biopsy on 02/12/24.   She passed her voiding trial.  was backfilled into the bladder Pt was unable to make it to the bathroom before voiding so amount not measured. PVR by bladder scan was 0ml.   She was discharged without a catheter. Please call her for a routine post op check. Thanks!  [name], MD

## 2024-02-13 NOTE — Progress Notes (Signed)
 [name]  underwent Colporrhaphy, Anterior, For Cystocele Repair, Sacrospinous Hysteropexy, Creation, Urethral Sling, Retropubic Approach, Using Polypropylene Tape, and CYSTOSCOPY, Bladder Biopsy and fulgeration  on 02/12/2024  with [x] Dr [name] [] Dr Guadlupe.  The patient reports that her pain is controlled.  She is taking [] No Medication [x] Acetaminophen  500mg  every 6 hours [] Ibuprofen 600mg  every 6 hours or []  Prescribed Narcotic.  Her pain level is 3[x] with medication.   She is having some minimal vaginal bleeding.  The patient is tolerating PO fluids and solids. She has not had a bowel movement and is taking Miralax  for a bowel regimen. She is not passing gas.  She was discharged without a catheter.   [x]  Discharged without a catheter, the patient does not feel as if she is emptying her bladder.   Patient came to the office today with concerns  of urinary retention. PVR showed 280 ml with the bladder scan. Per Dr. [name] 14 french cath was placed, bladder drained for 250 ml. Reviewed cath care with the patient. Nurse visit return for voiding trial Friday 02-16-2024.  Follow up scheduled  Reviewed Post operative instructions as needed to answer additional questions.   CC'd note to patient's provider.

## 2024-02-15 ENCOUNTER — Emergency Department (HOSPITAL_COMMUNITY): Admission: EM | Admit: 2024-02-15 | Discharge: 2024-02-15 | Disposition: A | Discharge status: Home / Self Care

## 2024-02-15 ENCOUNTER — Telehealth: Payer: Self-pay | Admitting: Obstetrics and Gynecology

## 2024-02-15 ENCOUNTER — Other Ambulatory Visit: Payer: Self-pay

## 2024-02-15 DIAGNOSIS — Z7982 Long term (current) use of aspirin: Secondary | ICD-10-CM | POA: Diagnosis not present

## 2024-02-15 DIAGNOSIS — R339 Retention of urine, unspecified: Secondary | ICD-10-CM | POA: Diagnosis not present

## 2024-02-15 NOTE — ED Notes (Signed)
 Pt verbalized understanding of discharge instructions. Pt ambulatory at time of discharge.

## 2024-02-15 NOTE — Discharge Instructions (Signed)
 Please follow-up with Dr. [name] and return to the ER or call your doctor for worsening symptoms.

## 2024-02-15 NOTE — ED Notes (Incomplete)
 Pt

## 2024-02-15 NOTE — Telephone Encounter (Signed)
 Pt called answering service. She is anterior repair, sacrospinous hysteropexy, midurethral sling, cystoscopy, bladder biopsy 02/12/24. She came to office on 11/4 and had a 14 f catheter placed when she was unable to void completely. Catheter initially worked well. Around 11am, she changed it to a leg bag and has not seen any drainage from the catheter since.   Had patient stand and detach leg bag and attach larger bag to the catheter. Bag was placed on the floor. Instructed her to check tubing for any kinks. She removed her pullup to inspect for any blockage. Advised her to move the catheter in and out. She ambulated around the house. There was still no drainage from the catheter. So instructed patient how to cut the cathter. Once cut, she was only able get out a few drops of urine in about an hour. Recommended going to ED for catheter placement- she will make her way to [facility].   [name], MD

## 2024-02-15 NOTE — ED Triage Notes (Signed)
 Pt coming in per her doctor for her foley coming out at home

## 2024-02-15 NOTE — ED Triage Notes (Signed)
(  Quick Triage RN note) Patient arrives ambulatory to desk with complaints of not being able to urinate after taking her catheter out. She says she was having trouble with it leaking today, she called the doctor, ended up taking it out, but having trouble urinating since, doctor wanted her to come in to have a new catheter placed.

## 2024-02-15 NOTE — ED Provider Notes (Signed)
 [place] EMERGENCY DEPARTMENT AT [facility] Provider Note   CSN: [id number] Arrival date & time: 02/15/24  2117     Patient presents with: Urinary Retention   [name] is a 73 y.o. female.   73 year old female with recent cystocele repair presents to the emergency department today with urinary retention.  The patient has a Foley catheter and it stopped draining.  She reported some suprapubic pressure but denies any fevers, chills, nausea, or vomiting.  Denies any abdominal pain.  She was sent in by her surgeon for Foley catheter replacement after hours.  I did discuss this with Dr. [name] who felt that if the patient was draining urine without any other symptoms that she could be discharged.        Prior to Admission medications   Medication Sig Start Date End Date Taking? Authorizing Provider  acetaminophen  (TYLENOL ) 500 MG tablet Take 1,000-1,500 mg by mouth every 8 (eight) hours as needed for moderate pain.    [provider]  acetaminophen  (TYLENOL ) 500 MG tablet Take 1 tablet (500 mg total) by mouth every 6 (six) hours as needed (pain). Patient not taking: Reported on 02/07/2024 01/12/24   [name] Rosaline SAILOR, MD  ALPHA LIPOIC ACID PO Take 1,400 mg by mouth daily.    [provider]  amoxicillin  (AMOXIL ) 500 MG tablet 4 tabs 1 hour prior to the procedure 12/14/23   Addie Cordella Hamilton, MD  Ascorbic Acid  (VITAMIN C ) 1000 MG tablet Take 1,000 mg by mouth daily. With zinc    [provider]  aspirin  81 MG chewable tablet Chew 1 tablet (81 mg total) by mouth 2 (two) times daily. 12/02/22   Magnant, Carlin CROME, PA-C  atenolol -chlorthalidone  (TENORETIC ) 50-25 MG tablet TAKE 1 TABLET DAILY 11/22/23   Rollene Almarie LABOR, MD  b complex vitamins tablet Take 1 tablet by mouth at bedtime.    [provider]  calcium  carbonate (OSCAL) 1500 (600 Ca) MG TABS tablet Take 600 mg of elemental calcium  by mouth daily.    [provider]  Cholecalciferol  (D 5000) 5000 units capsule Take 5,000 Units by mouth daily.    [provider]  Coenzyme Q10 (COQ10) 100 MG CAPS Take 100 mg by mouth every evening.     [provider]  diclofenac  Sodium (VOLTAREN ) 1 % GEL Apply 1 Application topically 2 (two) times daily.    [provider]  diphenoxylate -atropine  (LOMOTIL ) 2.5-0.025 MG tablet TAKE 1 TABLET BY MOUTH FOUR TIMES A DAY AS NEEDED FOR DIARRHEA OR LOOSE STOOLS 05/29/20   Aneita Gwendlyn DASEN, MD  ELDERBERRY PO Take 250 mg by mouth daily as needed.    [provider]  estradiol  (ESTRACE ) 0.1 MG/GM vaginal cream Place 0.5g nightly for two weeks then twice a week after 10/23/23   [name] Rosaline SAILOR, MD  folic acid  (FOLVITE ) 800 MCG tablet Take 800 mcg by mouth every evening.     [provider]  Glutathione 500 MG CAPS Take 500 mg by mouth daily.    [provider]  ketotifen  (ZADITOR ) 0.025 % ophthalmic solution Place 2 drops into both eyes 2 (two) times daily as needed (allergies).    [provider]  loperamide (IMODIUM A-D) 2 MG tablet Take 2-4 mg by mouth 4 (four) times daily as needed for diarrhea or loose stools.    [provider]  Melatonin 3 MG TABS Take 3 mg by mouth at bedtime.     [provider]  METAMUCIL FIBER PO Take 2 tablets by mouth daily as needed (constipation).    [provider]  methocarbamol  (ROBAXIN ) 500 MG tablet TAKE 1 TABLET BY MOUTH EVERY 8 HOURS AS NEEDED FOR MUSCLE SPASMS 02/24/20   Rollene Almarie LABOR, MD  Multiple Vitamin (MULTIVITAMIN WITH MINERALS) TABS tablet Take 1 tablet by mouth at bedtime.    [provider]  Multiple Vitamins-Minerals (ZINC PO) Take 1 Dose by mouth daily. 1 dose = 1 dropper full of liquid zinc    [provider]  OVER THE COUNTER MEDICATION Take 1 Dose by mouth at bedtime. Lemon Balm    [provider]  OVER THE COUNTER MEDICATION Take 1 Dose by mouth  at bedtime. Cats Claw liquid daily    [provider]  OVER THE COUNTER MEDICATION Apply 1 Application topically at bedtime. Magnesium  lotion    [provider]  oxyCODONE  (OXY IR/ROXICODONE ) 5 MG immediate release tablet Take 1 tablet (5 mg total) by mouth every 4 (four) hours as needed for severe pain (pain score 7-10). Patient not taking: Reported on 02/07/2024 01/12/24   [name] Rosaline SAILOR, MD  potassium chloride  SA (KLOR-CON  M20) 20 MEQ tablet Take 1 tablet (20 mEq total) by mouth 2 (two) times daily. 06/08/23   Rollene Almarie LABOR, MD  saccharomyces boulardii (FLORASTOR) 250 MG capsule Take 250 mg by mouth daily.    [provider]  simvastatin  (ZOCOR ) 20 MG tablet TAKE 1 TABLET DAILY 11/22/23   Rollene Almarie LABOR, MD  Trospium  Chloride 60 MG CP24 Take 1 capsule (60 mg total) by mouth daily. 10/20/23   [name] Rosaline SAILOR, MD  TURMERIC CURCUMIN PO Take 630 mg by mouth daily.    [provider]    Allergies: Egg protein-containing drug products, Etodolac, Fish protein-containing drug products, Omeprazole, Pineapple, and Gluten meal    Review of Systems  Genitourinary:  Positive for decreased urine volume.  All other systems reviewed and are negative.   Updated Vital Signs BP 138/84 (BP Location: Right Arm)   Pulse 78   Temp 98 F (36.7 C)   Resp 17   Ht 5' 1 (1.549 m)   Wt 77 kg   LMP 02/09/2005   SpO2 100%   BMI 32.07 kg/m   Physical Exam Vitals and nursing note reviewed.   Gen: NAD Eyes: PERRL, EOMI HEENT: no oropharyngeal swelling Neck: trachea midline Resp: clear to auscultation bilaterally Card: RRR, no murmurs, rubs, or gallops Abd: nontender, nondistended, Foley catheter in place and draining yellow urine with no hematuria or clots noted Extremities: no calf tenderness, no edema Vascular: 2+ radial pulses bilaterally, 2+ DP pulses bilaterally Skin: no rashes Psyc: acting appropriately   (all labs ordered are listed,  but only abnormal results are displayed) Labs Reviewed - No data to display  EKG: None  Radiology: No results found.   Procedures   Medications Ordered in the ED - No data to display                                  Medical Decision Making 73 year old female with history of prolapse with recent cystocele repair presenting to the emergency department today with urinary retention and nonfunctioning Foley catheter.  This was replaced here in the emergency department.  The patient is draining urine well and reports resolution of her symptoms her vital signs are reassuring.  I think that she is stable for discharge.  Final diagnoses:  Urinary retention    ED Discharge Orders     None          Ula Prentice SAUNDERS, MD 02/15/24 2219

## 2024-02-16 ENCOUNTER — Ambulatory Visit

## 2024-02-19 ENCOUNTER — Ambulatory Visit (INDEPENDENT_AMBULATORY_CARE_PROVIDER_SITE_OTHER)

## 2024-02-19 ENCOUNTER — Ambulatory Visit: Payer: Medicare Other | Admitting: Adult Health

## 2024-02-19 VITALS — BP 123/76 | HR 60

## 2024-02-19 DIAGNOSIS — R339 Retention of urine, unspecified: Secondary | ICD-10-CM

## 2024-02-19 NOTE — Progress Notes (Signed)
 [name] underwent Colporrhaphy, Anterior, For Cystocele Repair, Sacrospinous Hysteropexy, Creation, Urethral Sling, Retropubic Approach, Using Polypropylene Tape, and CYSTOSCOPY, Bladder Biopsy and fulgeration on 02/12/2024  She presents for a voiding trial.   Patient was identified with 2 identifiers.  The patient states she does not have any concerns with the foley placed.  250 mL of NS was instilled into the bladder via a catheter.  The catheter was removed and patient was instructed to void into the urinary hat.  She voided 100 mL.  Patient lost urine on the bed and floor on the way to the toilet. The post void residual measured by bladder scan was 124 mL. Patient chart reviewed with Dr.Yuen, she has [name] going to lunch to return after meal for a repeat PVR and see if she voids.   Patient returned to clinic and foley cath was replaced. Patient left for lunch and returned. She felt like she leaked some urine but no actual void occurred. Pull up ws slightly wet. PVR was 238, with no desire to urinate and catheter was replaced.   [name] called at 356 pm to give update that her catheter was working well with no current concerns.

## 2024-02-19 NOTE — Patient Instructions (Signed)
 Please keep all scheduled follow ups.  It was a pleasure to see you today!  Thank you for trusting me with your care!

## 2024-02-20 ENCOUNTER — Other Ambulatory Visit: Payer: Self-pay | Admitting: Internal Medicine

## 2024-02-21 ENCOUNTER — Ambulatory Visit

## 2024-02-21 NOTE — Patient Instructions (Signed)
 Thank you for ensuring us  with your care. Please keep all future appointments and if you have any questions or concerns please feel free to contact our office at [phone].

## 2024-02-21 NOTE — Progress Notes (Signed)
 [name] underwent Colporrhaphy, Anterior, For Cystocele Repair, Sacrospinous Hysteropexy, Creation, Urethral Sling, Retropubic Approach, Using Polypropylene Tape, and CYSTOSCOPY, Bladder Biopsy and fulgeration on 02/12/2024  She presents for a voiding trial.   Patient was identified with 2 identifiers.  The patient states she does not have any concerns with the foley placed.  200 mL of NS was instilled into the bladder via a catheter.  The catheter was removed and patient was instructed to void into the urinary hat.  She voided 60 mL.  The post void residual measured by bladder scan was 224 mL.  She did not pass the voiding trial.  The patient was sent home with a catheter.    The patient received aftercare instructions and will follow up as scheduled.

## 2024-02-27 NOTE — Progress Notes (Unsigned)
 [name]

## 2024-02-28 ENCOUNTER — Ambulatory Visit (INDEPENDENT_AMBULATORY_CARE_PROVIDER_SITE_OTHER): Admitting: Adult Health

## 2024-02-28 ENCOUNTER — Encounter: Payer: Self-pay | Admitting: Adult Health

## 2024-02-28 ENCOUNTER — Encounter: Payer: Self-pay | Admitting: Oncology

## 2024-02-28 VITALS — BP 128/80 | HR 110 | Ht 62.0 in | Wt 168.0 lb

## 2024-02-28 DIAGNOSIS — M5416 Radiculopathy, lumbar region: Secondary | ICD-10-CM | POA: Diagnosis not present

## 2024-02-28 DIAGNOSIS — G4733 Obstructive sleep apnea (adult) (pediatric): Secondary | ICD-10-CM | POA: Diagnosis not present

## 2024-02-28 DIAGNOSIS — I610 Nontraumatic intracerebral hemorrhage in hemisphere, subcortical: Secondary | ICD-10-CM

## 2024-02-28 DIAGNOSIS — R200 Anesthesia of skin: Secondary | ICD-10-CM

## 2024-02-28 DIAGNOSIS — R519 Headache, unspecified: Secondary | ICD-10-CM

## 2024-02-29 ENCOUNTER — Encounter: Payer: Self-pay | Admitting: Adult Health

## 2024-02-29 ENCOUNTER — Ambulatory Visit: Admitting: Obstetrics and Gynecology

## 2024-02-29 ENCOUNTER — Encounter: Payer: Self-pay | Admitting: Obstetrics and Gynecology

## 2024-02-29 VITALS — BP 106/70 | HR 97

## 2024-02-29 DIAGNOSIS — R82998 Other abnormal findings in urine: Secondary | ICD-10-CM

## 2024-02-29 DIAGNOSIS — Z48816 Encounter for surgical aftercare following surgery on the genitourinary system: Secondary | ICD-10-CM

## 2024-02-29 DIAGNOSIS — N390 Urinary tract infection, site not specified: Secondary | ICD-10-CM

## 2024-02-29 LAB — POCT URINALYSIS DIP (CLINITEK)
Bilirubin, UA: NEGATIVE
Glucose, UA: NEGATIVE mg/dL
Ketones, POC UA: NEGATIVE mg/dL
Nitrite, UA: NEGATIVE
POC PROTEIN,UA: 100 — AB
Spec Grav, UA: 1.01 (ref 1.010–1.025)
Urobilinogen, UA: 0.2 U/dL
pH, UA: 6.5 (ref 5.0–8.0)

## 2024-02-29 MED ORDER — SULFAMETHOXAZOLE-TRIMETHOPRIM 800-160 MG PO TABS: 1.0000 | ORAL_TABLET | Freq: Two times a day (BID) | ORAL | 0 refills | Status: AC

## 2024-02-29 NOTE — Progress Notes (Signed)
 CMA attempted backfill but patient had constant leakage.   Urethra prepped and catheter inserted- PVR from prior filling/ void attempt was 5cc. Patient backfilled with approximately 150cc by gravity. After that time, she began leaking around the catheter. Attempts at refilling had the same results. Speculum exam was performed and sutures are intact and healing well, but pooling occurred with opening the speculum. Bladder was refilled with 50cc and speculum inserted. While gently opening speculum, visualized continuous leakage from the urethra- no leakage from the vagina.   Discussed with patient that overactive bladder is preventing a complete voiding trial. However, she is emptying her bladder with leaking. She was previously on trospium  but this was stopped in the post op period. Restart trospium  60mg  ER. Also given 3 weeks of Gemtesa samples- advised her to start this if she is still leaking a lot in a week.   Results for orders placed or performed in visit on 02/29/24  POCT URINALYSIS DIP (CLINITEK)   Collection Time: 02/29/24 12:15 PM  Result Value Ref Range   Color, UA yellow yellow   Clarity, UA cloudy (A) clear   Glucose, UA negative negative mg/dL   Bilirubin, UA negative negative   Ketones, POC UA negative negative mg/dL   Spec Grav, UA 8.989 8.989 - 1.025   Blood, UA large (A) negative   pH, UA 6.5 5.0 - 8.0   POC PROTEIN,UA =100 (A) negative, trace   Urobilinogen, UA 0.2 0.2 or 1.0 E.U./dL   Nitrite, UA Negative Negative   Leukocytes, UA Large (3+) (A) Negative   *Note: Due to a large number of results and/or encounters for the requested time period, some results have not been displayed. A complete set of results can be found in Results Review.    With presence of large leukocytes in urine, will also treat for possible UTI with bactrim  DS BID x3 days.   Return 3 weeks for post op visit  [name], MD

## 2024-03-01 ENCOUNTER — Ambulatory Visit (INDEPENDENT_AMBULATORY_CARE_PROVIDER_SITE_OTHER)

## 2024-03-01 ENCOUNTER — Other Ambulatory Visit (HOSPITAL_COMMUNITY)
Admission: RE | Admit: 2024-03-01 | Discharge: 2024-03-01 | Disposition: A | Discharge status: Home / Self Care | Source: Ambulatory Visit | Attending: Obstetrics and Gynecology | Admitting: Obstetrics and Gynecology

## 2024-03-01 DIAGNOSIS — R82998 Other abnormal findings in urine: Secondary | ICD-10-CM | POA: Insufficient documentation

## 2024-03-01 DIAGNOSIS — R350 Frequency of micturition: Secondary | ICD-10-CM

## 2024-03-01 LAB — POCT URINALYSIS DIP (CLINITEK)
Bilirubin, UA: NEGATIVE
Glucose, UA: NEGATIVE mg/dL
Ketones, POC UA: NEGATIVE mg/dL
Nitrite, UA: NEGATIVE
POC PROTEIN,UA: 100 — AB
Spec Grav, UA: 1.01 (ref 1.010–1.025)
Urobilinogen, UA: 0.2 U/dL
pH, UA: 6.5 (ref 5.0–8.0)

## 2024-03-01 NOTE — Patient Instructions (Signed)
 Thank you for ensuring us  with your care. Please keep all future appointments and if you have any questions or concerns please feel free to contact our office at [phone].

## 2024-03-01 NOTE — Progress Notes (Signed)
 [name] is a 73 y.o. female arrived today to leave a urine sample to be sent for culture.

## 2024-03-02 LAB — URINE CULTURE

## 2024-03-04 ENCOUNTER — Encounter: Payer: Self-pay | Admitting: Obstetrics and Gynecology

## 2024-03-09 ENCOUNTER — Emergency Department (HOSPITAL_COMMUNITY)

## 2024-03-09 ENCOUNTER — Encounter (HOSPITAL_COMMUNITY): Payer: Self-pay

## 2024-03-09 ENCOUNTER — Other Ambulatory Visit: Payer: Self-pay

## 2024-03-09 ENCOUNTER — Observation Stay (HOSPITAL_COMMUNITY)
Admission: EM | Admit: 2024-03-09 | Discharge: 2024-03-13 | Disposition: A | Attending: Obstetrics | Admitting: Obstetrics

## 2024-03-09 DIAGNOSIS — E876 Hypokalemia: Secondary | ICD-10-CM | POA: Diagnosis not present

## 2024-03-09 DIAGNOSIS — Z7982 Long term (current) use of aspirin: Secondary | ICD-10-CM | POA: Insufficient documentation

## 2024-03-09 DIAGNOSIS — R339 Retention of urine, unspecified: Secondary | ICD-10-CM | POA: Diagnosis not present

## 2024-03-09 DIAGNOSIS — N9489 Other specified conditions associated with female genital organs and menstrual cycle: Principal | ICD-10-CM | POA: Diagnosis present

## 2024-03-09 DIAGNOSIS — Z8673 Personal history of transient ischemic attack (TIA), and cerebral infarction without residual deficits: Secondary | ICD-10-CM | POA: Diagnosis not present

## 2024-03-09 DIAGNOSIS — K567 Ileus, unspecified: Secondary | ICD-10-CM | POA: Diagnosis not present

## 2024-03-09 DIAGNOSIS — R1030 Lower abdominal pain, unspecified: Secondary | ICD-10-CM | POA: Diagnosis not present

## 2024-03-09 DIAGNOSIS — R1024 Suprapubic pain: Secondary | ICD-10-CM

## 2024-03-09 DIAGNOSIS — K9189 Other postprocedural complications and disorders of digestive system: Secondary | ICD-10-CM | POA: Diagnosis not present

## 2024-03-09 LAB — URINALYSIS, ROUTINE W REFLEX MICROSCOPIC
Bilirubin Urine: NEGATIVE
Glucose, UA: NEGATIVE mg/dL
Hgb urine dipstick: NEGATIVE
Ketones, ur: NEGATIVE mg/dL
Leukocytes,Ua: NEGATIVE
Nitrite: NEGATIVE
Protein, ur: NEGATIVE mg/dL
Specific Gravity, Urine: 1.015 (ref 1.005–1.030)
pH: 8 (ref 5.0–8.0)

## 2024-03-09 LAB — BASIC METABOLIC PANEL WITH GFR
Anion gap: 15 (ref 5–15)
BUN: 13 mg/dL (ref 8–23)
CO2: 23 mmol/L (ref 22–32)
Calcium: 9.4 mg/dL (ref 8.9–10.3)
Chloride: 99 mmol/L (ref 98–111)
Creatinine, Ser: 0.81 mg/dL (ref 0.44–1.00)
GFR, Estimated: >60 mL/min (ref >60)
Glucose, Bld: 112 mg/dL — ABNORMAL HIGH (ref 70–99)
Potassium: 2.9 mmol/L — ABNORMAL LOW (ref 3.5–5.1)
Sodium: 137 mmol/L (ref 135–145)

## 2024-03-09 LAB — CBC WITH DIFFERENTIAL/PLATELET
Abs Immature Granulocytes: 0.03 K/uL (ref 0.00–0.07)
Basophils Absolute: 0.1 K/uL (ref 0.0–0.1)
Basophils Relative: 1 %
Eosinophils Absolute: 0.1 K/uL (ref 0.0–0.5)
Eosinophils Relative: 1 %
HCT: 41.3 % (ref 36.0–46.0)
Hemoglobin: 13.2 g/dL (ref 12.0–15.0)
Immature Granulocytes: 0 %
Lymphocytes Relative: 16 %
Lymphs Abs: 1.7 K/uL (ref 0.7–4.0)
MCH: 29.6 pg (ref 26.0–34.0)
MCHC: 32 g/dL (ref 30.0–36.0)
MCV: 92.6 fL (ref 80.0–100.0)
Monocytes Absolute: 1 K/uL (ref 0.1–1.0)
Monocytes Relative: 9 %
Neutro Abs: 8.1 K/uL — ABNORMAL HIGH (ref 1.7–7.7)
Neutrophils Relative %: 73 %
Platelets: 314 K/uL (ref 150–400)
RBC: 4.46 MIL/uL (ref 3.87–5.11)
RDW: 14.7 % (ref 11.5–15.5)
WBC: 11 K/uL — ABNORMAL HIGH (ref 4.0–10.5)
nRBC: 0.3 % — ABNORMAL HIGH (ref 0.0–0.2)

## 2024-03-09 MED ORDER — ONDANSETRON HCL 4 MG/2ML IJ SOLN: 4.0000 mg | Freq: Four times a day (QID) | INTRAMUSCULAR | Status: DC | PRN

## 2024-03-09 MED ORDER — IOHEXOL 350 MG/ML SOLN
75.0000 mL | Freq: Once | INTRAVENOUS | Status: AC | PRN
  Administered 2024-03-09: 75 mL via INTRAVENOUS

## 2024-03-09 MED ORDER — SIMETHICONE 80 MG PO CHEW: 80.0000 mg | CHEWABLE_TABLET | Freq: Four times a day (QID) | ORAL | Status: DC | PRN

## 2024-03-09 MED ORDER — METRONIDAZOLE 500 MG/100ML IV SOLN
500.0000 mg | Freq: Once | INTRAVENOUS | Status: AC
  Administered 2024-03-09: 500 mg via INTRAVENOUS
  Filled 2024-03-09: qty 100

## 2024-03-09 MED ORDER — POTASSIUM CHLORIDE CRYS ER 20 MEQ PO TBCR
40.0000 meq | EXTENDED_RELEASE_TABLET | Freq: Once | ORAL | Status: AC
  Administered 2024-03-09: 40 meq via ORAL
  Filled 2024-03-09: qty 2

## 2024-03-09 MED ORDER — HEPARIN SODIUM (PORCINE) 5000 UNIT/ML IJ SOLN: 5000.0000 [IU] | Freq: Three times a day (TID) | INTRAMUSCULAR | Status: DC

## 2024-03-09 MED ORDER — POTASSIUM CHLORIDE 10 MEQ/100ML IV SOLN
10.0000 meq | INTRAVENOUS | Status: AC
  Administered 2024-03-09 (×2): 10 meq via INTRAVENOUS
  Filled 2024-03-09 (×2): qty 100

## 2024-03-09 MED ORDER — KCL IN DEXTROSE-NACL 10-5-0.45 MEQ/L-%-% IV SOLN
INTRAVENOUS | Status: DC
  Filled 2024-03-09: qty 1000

## 2024-03-09 MED ORDER — PROMETHAZINE HCL 25 MG/ML IJ SOLN
12.5000 mg | Freq: Four times a day (QID) | INTRAMUSCULAR | Status: DC | PRN
  Administered 2024-03-09: 12.5 mg via INTRAMUSCULAR
  Filled 2024-03-09: qty 1

## 2024-03-09 MED ORDER — CEFAZOLIN SODIUM-DEXTROSE 2-4 GM/100ML-% IV SOLN
2.0000 g | Freq: Once | INTRAVENOUS | Status: AC
  Administered 2024-03-09: 2 g via INTRAVENOUS
  Filled 2024-03-09: qty 100

## 2024-03-09 MED ORDER — HEPARIN SODIUM (PORCINE) 5000 UNIT/ML IJ SOLN
5000.0000 [IU] | Freq: Three times a day (TID) | INTRAMUSCULAR | Status: DC
  Administered 2024-03-09 – 2024-03-13 (×13): 5000 [IU] via SUBCUTANEOUS
  Filled 2024-03-09 (×13): qty 1

## 2024-03-09 MED ORDER — HYDROMORPHONE HCL 1 MG/ML IJ SOLN: 0.2000 mg | INTRAMUSCULAR | Status: DC | PRN

## 2024-03-09 MED ORDER — ONDANSETRON HCL 4 MG/2ML IJ SOLN
4.0000 mg | Freq: Once | INTRAMUSCULAR | Status: AC
  Administered 2024-03-09: 4 mg via INTRAVENOUS
  Filled 2024-03-09: qty 2

## 2024-03-09 MED ORDER — ONDANSETRON HCL 4 MG PO TABS
4.0000 mg | ORAL_TABLET | Freq: Four times a day (QID) | ORAL | Status: DC | PRN
  Administered 2024-03-11: 4 mg via ORAL
  Filled 2024-03-09: qty 1

## 2024-03-09 NOTE — ED Provider Notes (Signed)
 Accepted handoff at shift change from [employer]. Please see prior provider note for more detail.   Briefly: Patient is 73 y.o.  chief complaint of urinary retention.  States that she has had some dribbling and leaking over the past day or so, but has not been able to void normally.  Had recent bladder surgery on 11/3.  States that she had troubles with urinary retention and had a Foley catheter for about 2 weeks.  States that over the past day or so her symptoms have acutely worsened.  She states that she feels like she is retaining.  She reports feeling uncomfortable.  States that she has also not been able to have a bowel movement and thinks she may have flared up her colitis.  Denies fever or chills.  Denies any treatment prior to arrival.   Plan:  - dispo pending CT and potassium repletion. - history of cystocele with recent anterior repair, sacrospinous hysteropexy, midurethral sling, cystoscopy, bladder biopsy on 11/3. - CT showing large hematoma near bladder and possible mild SBO vs ileus. I spoke with patient who states that she has been having nausea with an episode of emesis on Thursday. Patient unsure of last BM but states that it might been last Thursday as well.  - I consulted with the Urogynecologist on-call who recommends admission and starting Ancef  and Flagyl  for the hematoma and placing the patient on NPO. Please see their notes for further detail. I have placed these orders for the IV ABX. I have also consulted with general surgeon on-call Dr. Sebastian who agrees to evaluate patient for her SBO vs ileus later today. Patient without vomiting today and does not appear to need NG tube.    .Critical Care  Performed by: Hoy Nidia FALCON, PA-C Authorized by: Hoy Nidia FALCON, PA-C   Critical care provider statement:    Critical care time (minutes):  30   Critical care was necessary to treat or prevent imminent or life-threatening deterioration of the following conditions:  urinary retention, large hematoma, SBO.   Critical care was time spent personally by me on the following activities:  Development of treatment plan with patient or surrogate, discussions with consultants, evaluation of patient's response to treatment, examination of patient, ordering and review of laboratory studies, ordering and review of radiographic studies, ordering and performing treatments and interventions, pulse oximetry, re-evaluation of patient's condition and review of old charts   Care discussed with: admitting provider       Hoy Nidia FALCON, PA-C 03/09/24 [id number]    Midge Golas, MD 03/09/24 [id number]

## 2024-03-09 NOTE — Progress Notes (Signed)
 ED Pharmacy Antibiotic Sign Off An antibiotic consult was received from an ED provider for cefazolin  per pharmacy dosing for hematoma. A chart review was completed to assess appropriateness.   The following one time order(s) were placed:  Cefazolin  2 g IV once  Further antibiotic and/or antibiotic pharmacy consults should be ordered by the admitting provider if indicated.   Thank you for allowing pharmacy to be a part of this patient's care.   Kile Kabler M Annete Ayuso, [facility]  Clinical Pharmacist 03/09/24 8:29 AM

## 2024-03-09 NOTE — ED Notes (Signed)
 Pt back from ct

## 2024-03-09 NOTE — Consult Note (Signed)
 Reason for Consult:SBO vs ileus Referring Physician: Lianne Gillis  [name] is an 73 y.o. female.  HPI: 73yo F S/P cystocele repair surgery 11/3 presented to the emergency department with urinary retention.  Further workup included CT scan of the abdomen and pelvis which shows low-grade small bowel obstruction versus ileus.  It also showed a large pelvic hematoma.  She has not been having a lot of GI symptoms.  She has been eating.  She is taking MiraLAX  to help with bowel movements.  Her bowel movements have been very small for the past couple of days.  Not having a lot of abdominal pain.  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 2009 hyperplastic.    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 [facility]);   treated w/ IV iron ,  last x4  01/ 2025   OA (osteoarthritis)    back, fingers with joint pain and swelling.  chronic back pain   OAB (overactive bladder)    OSA on CPAP 08/2021   wears CPAP nightly;   followed by dr buck;   HST in epic 08-23-2021    moderate osa   Osteopenia 01/2014   T score -1.1 FRAX 14%/0.5%. Stable from prior DEXA   PONV (postoperative nausea and vomiting)    Presence of pessary    Seasonal allergies    SUI (stress urinary incontinence, female)    Uterovaginal prolapse, incomplete     Past Surgical History:  Procedure Laterality Date   BELPHAROPTOSIS REPAIR Bilateral 12/14/2017   BLADDER SUSPENSION N/A 02/12/2024   Procedure: CREATION, URETHRAL SLING, RETROPUBIC APPROACH, USING POLYPROPYLENE TAPE;  Surgeon: [name] Rosaline SAILOR, MD;  Location: [facility] OR;  Service: Gynecology;  Laterality: N/A;   BUBBLE STUDY  02/16/2021   Procedure: BUBBLE STUDY;  Surgeon: Alveta Aleene PARAS, MD;  Location: [facility] ENDOSCOPY;  Service: Cardiovascular;;   CARPAL TUNNEL RELEASE Right 08/26/2019   @[facility]    by Dr Addie ORIN MEDIATE RELEASE Left 06/24/2019   @[facility]   by Dr Addie   CATARACT EXTRACTION W/ INTRAOCULAR LENS IMPLANT Bilateral 10/2017   COLONOSCOPY WITH ESOPHAGOGASTRODUODENOSCOPY (EGD)  02/10/2020   dr  stark   CYSTOCELE REPAIR N/A 02/12/2024   Procedure: COLPORRHAPHY, ANTERIOR, FOR CYSTOCELE REPAIR;  Surgeon: [name] Rosaline SAILOR, MD;  Location: [facility] OR;  Service: Gynecology;  Laterality: N/A;  Total time 1.5 hours   CYSTOSCOPY N/A 02/12/2024   Procedure: CYSTOSCOPY, Bladder Biopsy and fulgeration;  Surgeon: [name] Rosaline SAILOR, MD;  Location: [facility] OR;  Service: Gynecology;  Laterality: N/A;   GYNECOLOGIC CRYOSURGERY     yrs ago   HYSTEROSCOPY WITH D & C  12/07/2010   @ [facility] by Dr Rockney;   with resection of endometrial polyp   KNEE ARTHROSCOPY Left 2008   LUMBAR SPINE SURGERY     x 2    last one 2000   MASS EXCISION Right 08/15/2013   Procedure: RIGHT INDEX EXCISION MASS ;  Surgeon: Franky JONELLE Curia, MD;  Location: [facility];  Service: Orthopedics;  Laterality: Right;   NASAL SEPTUM SURGERY  1972   approx   OOPHORECTOMY Right 2001   REVERSE SHOULDER ARTHROPLASTY Left 12/11/2018   Procedure: LEFT REVERSE SHOULDER  ARTHROPLASTY;  Surgeon: Addie Cordella Hamilton, MD;  Location: [facility] OR;  Service: Orthopedics;  Laterality: Left;   SHOULDER ARTHROSCOPY WITH OPEN ROTATOR CUFF REPAIR AND DISTAL CLAVICLE ACROMINECTOMY Right 11/26/2013   Procedure: RIGHT SHOULDER ARTHROSCOPY WITH MINI OPEN ROTATOR CUFF REPAIR AND DISTAL CLAVICLE RESECTION, SUBACROMIAL DECOMPRESSION, POSSIBLE [facility] PATCH.;  Surgeon: Maude LELON Right, MD;  Location: [facility] OR;  Service: Orthopedics;  Laterality: Right;   TEE WITHOUT CARDIOVERSION N/A 02/16/2021   Procedure: TRANSESOPHAGEAL ECHOCARDIOGRAM (TEE);  Surgeon: Alveta Aleene PARAS, MD;  Location: [facility] ENDOSCOPY;  Service: Cardiovascular;  Laterality: N/A;   TOTAL KNEE ARTHROPLASTY Right 03/21/2017   Procedure: RIGHT TOTAL KNEE ARTHROPLASTY;  Surgeon: Right Maude LELON, MD;  Location: [facility] OR;  Service: Orthopedics;  Laterality: Right;   TOTAL KNEE ARTHROPLASTY Left 11/29/2022   Procedure: LEFT TOTAL KNEE ARTHROPLASTY;  Surgeon: Addie Cordella Hamilton, MD;  Location: [facility] ORS;  Service: Orthopedics;  Laterality: Left;   TUBAL LIGATION Bilateral    yrs ago   VAGINAL PROLAPSE REPAIR N/A 02/12/2024   Procedure: SACROSPINOUS HYSTEROPEXY;  Surgeon: [name] Rosaline SAILOR, MD;  Location: [facility] OR;  Service: Gynecology;  Laterality: N/A;  sacrospinuous hysteropexy (sacrospinuous ligament fixation)    Family History  Problem Relation Age of Onset   Hypertension Mother    Heart disease Mother    Diabetes Father    Hypertension Father    Heart disease Father    Stroke Father    Diabetes Maternal Aunt    Diabetes Paternal Grandmother    Hypertension Paternal Grandfather    Stroke Paternal Grandfather     Social History:  reports that she has never smoked. She has been exposed to tobacco smoke. She has never used smokeless tobacco. She reports that she does not currently use alcohol . She reports that she does not use drugs.  Allergies:  Allergies  Allergen Reactions   Egg Protein-Containing Drug Products  Other (See Comments)    GI UPSET, JOINT PAIN, DIFF. SWALLOWING   Etodolac Other (See Comments)    ABD. CRAMPING   Fish Protein-Containing Drug Products Other (See Comments)    GI UPSET, JOINT PAIN, DIFF. SWALLOWING    Omeprazole Other (See Comments)    ABD. CRAMPING   Pineapple Other (See Comments)    GI UPSET, JOINT PAIN, DIFF. SWALLOWING   Gluten Meal Diarrhea    GI upset    Medications: I have reviewed the patient's current medications.  Results for orders placed or performed during the hospital encounter of 03/09/24 (from the past 48 hours)  CBC with Differential     Status: Abnormal   Collection Time: 03/09/24  5:19 AM  Result Value Ref Range   WBC 11.0 (H) 4.0 - 10.5 K/uL   RBC 4.46 3.87 - 5.11 MIL/uL  Hemoglobin 13.2 12.0 - 15.0 g/dL   HCT 58.6 63.9 - 53.9 %   MCV 92.6 80.0 - 100.0 fL   MCH 29.6 26.0 - 34.0 pg   MCHC 32.0 30.0 - 36.0 g/dL   RDW 85.2 88.4 - 84.4 %   Platelets 314 150 - 400 K/uL   nRBC 0.3 (H) 0.0 - 0.2 %   Neutrophils Relative % 73 %   Neutro Abs 8.1 (H) 1.7 - 7.7 K/uL   Lymphocytes Relative 16 %   Lymphs Abs 1.7 0.7 - 4.0 K/uL   Monocytes Relative 9 %   Monocytes Absolute 1.0 0.1 - 1.0 K/uL   Eosinophils Relative 1 %   Eosinophils Absolute 0.1 0.0 - 0.5 K/uL   Basophils Relative 1 %   Basophils Absolute 0.1 0.0 - 0.1 K/uL   Immature Granulocytes 0 %   Abs Immature Granulocytes 0.03 0.00 - 0.07 K/uL    Comment: Performed at [facility], [address], [place], [place] [address]  Basic metabolic panel     Status: Abnormal   Collection Time: 03/09/24  5:19 AM  Result Value Ref Range   Sodium 137 135 - 145 mmol/L   Potassium 2.9 (L) 3.5 - 5.1 mmol/L   Chloride 99 98 - 111 mmol/L   CO2 23 22 - 32 mmol/L   Glucose, Bld 112 (H) 70 - 99 mg/dL    Comment: Glucose reference range applies only to samples taken after fasting for at least 8 hours.   BUN 13 8 - 23 mg/dL   Creatinine, Ser 9.18 0.44 - 1.00 mg/dL   Calcium  9.4 8.9 - 10.3 mg/dL    GFR, Estimated >39 >39 mL/min    Comment: (NOTE) Calculated using the CKD-EPI Creatinine Equation (2021)    Anion gap 15 5 - 15    Comment: Performed at [facility], [address], [place], [place] [address]  Urinalysis, Routine w reflex microscopic -Urine, Clean Catch     Status: Abnormal   Collection Time: 03/09/24  5:32 AM  Result Value Ref Range   Color, Urine YELLOW YELLOW   APPearance HAZY (A) CLEAR   Specific Gravity, Urine 1.015 1.005 - 1.030   pH 8.0 5.0 - 8.0   Glucose, UA NEGATIVE NEGATIVE mg/dL   Hgb urine dipstick NEGATIVE NEGATIVE   Bilirubin Urine NEGATIVE NEGATIVE   Ketones, ur NEGATIVE NEGATIVE mg/dL   Protein, ur NEGATIVE NEGATIVE mg/dL   Nitrite NEGATIVE NEGATIVE   Leukocytes,Ua NEGATIVE NEGATIVE    Comment: Microscopic not done on urines with negative protein, blood, leukocytes, nitrite, or glucose < 500 mg/dL. Performed at [facility], [address] [place], [place] [address]    *Note: Due to a large number of results and/or encounters for the requested time period, some results have not been displayed. A complete set of results can be found in Results Review.    CT ABDOMEN PELVIS W CONTRAST Result Date: 03/09/2024 EXAM: CT ABDOMEN AND PELVIS WITH CONTRAST 03/09/2024 06:28:31 AM TECHNIQUE: CT of the abdomen and pelvis was performed with the administration of 75 mL of iohexol  (OMNIPAQUE ) 350 MG/ML injection. Multiplanar reformatted images are provided for review. Automated exposure control, iterative reconstruction, and/or weight-based adjustment of the mA/kV was utilized to reduce the radiation dose to as low as reasonably achievable. COMPARISON: CT with contrast 01/01/2020. CLINICAL HISTORY: Abdominal pain, acute, nonlocalized. Patient had bladder surgery on November 3 and is leaking urine since the Foley was removed. FINDINGS: LOWER CHEST:  The cardiac size is normal. There is a small hiatal hernia. Linear scarring or atelectasis in the lung bases  without infiltrates. LIVER: The liver is unremarkable. GALLBLADDER AND BILE DUCTS: Gallbladder is unremarkable. No biliary ductal dilatation. SPLEEN: No acute abnormality. PANCREAS: No acute abnormality. ADRENAL GLANDS: There is no adrenal mass. KIDNEYS, URETERS AND BLADDER: There is a 1.8 cm Bosniak type 1 cyst in the superior pole of the left kidney, Hounsfield density is 13.2. No follow-up imaging is recommended. There is no renal mass enhancement. No urinary stone or obstruction. The bladder is contracted around a Foley balloon and displaced into the right pelvis due to a heterogeneous thin-walled anterior left pelvic collection with a Hounsfield density of 39-42. This is most likely a hematoma and measures 10.6 x 11.5 x 9.5 cm. There is no air in the collection. There are no overt adjacent inflammatory changes such as would be expected with an abscess, although infection is not strictly excluded. No perinephric or periureteral stranding. GI AND BOWEL: There is fluid filling of the stomach with normal wall thickness. Fluid-filled, mildly dilated small bowel loops in the abdomen interspersed with decompressed segments, maximum small bowel caliber 3.1 cm. Findings could be due to a low-grade small bowel obstruction or ileus. The appendix is normal in caliber. There is fluid in the ascending and transverse colon. No evidence of colitis or diverticulitis. PERITONEUM AND RETROPERITONEUM: There is minimal pelvic ascites posteriorly. No free air. No free hemorrhage or other localizing process. VASCULATURE: Aorta is normal in caliber. Heavy aortic calcific plaque without aneurysm, with branch vessel atherosclerosis. LYMPH NODES: No lymphadenopathy. REPRODUCTIVE ORGANS: The uterus and ovaries are not enlarged. BONES AND SOFT TISSUES: There is osteopenia and advanced degenerative change of the lumbar spine, mild dextroscoliosis and a grade 1 L2-L3 anterolisthesis, likely degenerative. Ankylosis of the right sacroiliac  joint. Bilateral hip degenerative joint disease. No focal soft tissue abnormality. IMPRESSION: 1. Large anterior left pelvic collection, likely hematoma, measuring 10.6 x 11.5 x 9.5 cm, displacing the bladder into the right pelvis; no gas or overt adjacent inflammatory change identified, however infection is not excluded. 2. Findings may reflect a low-grade small bowel obstruction versus ileus. A single transition point could not be located. Osteopenia and degenerative change. . [name] [name] Electronically signed by: Francis Quam MD 03/09/2024 07:07 AM EST RP Workstation: [id number]    Review of Systems Blood pressure 112/72, pulse 76, temperature 97.8 F (36.6 C), temperature source Oral, resp. rate 19, height 5' 2 (1.575 m), weight 77.1 kg, last menstrual period 02/09/2005, SpO2 100%. Physical Exam Cardiovascular:     Rate and Rhythm: Normal rate and regular rhythm.  Pulmonary:     Effort: Pulmonary effort is normal.     Breath sounds: No wheezing.  Abdominal:     General: There is no distension.     Palpations: Abdomen is soft.     Tenderness: There is no abdominal tenderness.  Neurological:     Mental Status: She is alert and oriented to person, place, and time.  Psychiatric:        Mood and Affect: Mood normal.   Foley with clear urine  Assessment/Plan: S/P cystocele repair 11/3 Pelvic hematoma Low grade SBO versus ileus -patient may have sips of clears for now.  We will follow.  Dann FORBES Hummer 03/09/2024, 10:02 AM

## 2024-03-09 NOTE — Progress Notes (Signed)
 Pt arrived from [employer] via wheelchair. Pt is alert and oriented. She is place in bed, foley catheter hung down on bed, call button and room phone in reach. IV is infusing and area is c/d/I.

## 2024-03-09 NOTE — Progress Notes (Signed)
 Interventional Radiology Brief Note:  [name] is a 73 year old female with cystocele s/p recent anterior repair, sacrospinous hysteropexy, midurethral sling, cystoscopy, bladder biopsy on 11/3.  She has progressively worsening abdominal discomfort with new nausea/vomiting.  Foley catheter has been in place since OR with increasing pelvic pressure.  With recent worsening symptoms, patient presented to the [facility] ED for evaluation.   CT Abdomen Pelvis shows a large hematoma near the bladder with concern for ileus.  IR consulted for aspiration/drainage.   UA negative for UTI.  Urine clear.  WBC WNL (11.0) Hgb stable. No concerns for infectious component.   Imaging reviewed by Dr. Jenna, who notes there is no evidence of a liquefied component to the suspected hematoma. Gelatinous material not likely to be significantly reduced with aspiration/drainage. No plans to bring to IR at this time.   Team aware.   Gilman Olazabal, MS RD PA-C

## 2024-03-09 NOTE — ED Triage Notes (Signed)
 Pt arrives from home with complaints of urinary retention. had bladder surgery 11/3 and initially had foley but was taken out. After foley came out, pt was leaking urine with any movement. Reports needing to change pad 3-4 times throughout the night dt leakage. Has not been able to urinate since before yesterday, reports leaking a little yesterday morning.  Aox4, ambulatory

## 2024-03-09 NOTE — Plan of Care (Signed)
  Problem: Nutrition: Goal: Adequate nutrition will be maintained Outcome: Not Progressing   Problem: Elimination: Goal: Will not experience complications related to urinary retention Outcome: Progressing

## 2024-03-09 NOTE — ED Notes (Signed)
 Pt bladder scanned, . PA aware

## 2024-03-09 NOTE — ED Provider Notes (Signed)
 [facility] Emergency Department Provider Note MRN:  [id number]  Arrival date & time: 03/09/24     Chief Complaint   Urinary Retention and Nausea   History of Present Illness   [name] is a 73 y.o. year-old female presents to the ED with chief complaint of urinary retention.  States that she has had some dribbling and leaking over the past day or so, but has not been able to void normally.  Had recent bladder surgery on 11/3.  States that she had troubles with urinary retention and had a Foley catheter for about 2 weeks.  States that over the past day or so her symptoms have acutely worsened.  She states that she feels like she is retaining.  She reports feeling uncomfortable.  States that she has also not been able to have a bowel movement and thinks she may have flared up her colitis.  Denies fever or chills.  Denies any treatment prior to arrival.  History provided by patient.   Review of Systems  Pertinent positive and negative review of systems noted in HPI.    Physical Exam   Vitals:   03/09/24 0545 03/09/24 0600  BP: 118/71 121/72  Pulse: 80 83  Resp:    Temp:    SpO2: 100% 99%    CONSTITUTIONAL:  non toxic-appearing, NAD NEURO:  Alert and oriented x 3, CN 3-12 grossly intact EYES:  eyes equal and reactive ENT/NECK:  Supple, no stridor  CARDIO:  normal rate, regular rhythm, appears well-perfused  PULM:  No respiratory distress, CTAB GI/GU:  non-distended, mild suprapubic tenderness MSK/SPINE:  No gross deformities, no edema, moves all extremities  SKIN:  no rash, atraumatic   *Additional and/or pertinent findings included in MDM below  Diagnostic and Interventional Summary    EKG Interpretation Date/Time:    Ventricular Rate:    PR Interval:    QRS Duration:    QT Interval:    QTC Calculation:   R Axis:      Text Interpretation:         Labs Reviewed  CBC WITH DIFFERENTIAL/PLATELET - Abnormal; Notable for the  following components:      Result Value   WBC 11.0 (*)    nRBC 0.3 (*)    Neutro Abs 8.1 (*)    All other components within normal limits  BASIC METABOLIC PANEL WITH GFR - Abnormal; Notable for the following components:   Potassium 2.9 (*)    Glucose, Bld 112 (*)    All other components within normal limits  URINALYSIS, ROUTINE W REFLEX MICROSCOPIC - Abnormal; Notable for the following components:   APPearance HAZY (*)    All other components within normal limits    CT ABDOMEN PELVIS W CONTRAST    (Results Pending)    Medications  potassium chloride  10 mEq in 100 mL IVPB (has no administration in time range)  potassium chloride  SA (KLOR-CON  M) CR tablet 40 mEq (has no administration in time range)     Procedures  /  Critical Care Procedures  ED Course and Medical Decision Making  I have reviewed the triage vital signs, the nursing notes, and pertinent available records from the EMR.  Social Determinants Affecting Complexity of Care: Patient has no clinically significant social determinants affecting this chief complaint..   ED Course:    Medical Decision Making Patient here with abdominal pain and urinary retention.  She had bladder surgery on 11/3.  States that she had a Foley  catheter for about 2 weeks.  States that she has struggled with normal urination since having that removed.  States that over the past day or so she is only able to dribble some urine.  She states that she feels like her bladder is full.  She also feels like she is flaring up for colitis and feels like something else may be going on in her abdomen.  Bladder scan shows 444 mL of urine.  Will insert Foley catheter.  On reassessment, patient still feels uncomfortable and is still somewhat tender.  She has mildly elevated white blood cell count.  Given her recent surgeries and persistent discomfort, will check CT abdomen/pelvis to rule out other acute pathology.  Amount and/or Complexity of Data  Reviewed Labs: ordered. Radiology: ordered.  Risk Prescription drug management.         Consultants:    Treatment and Plan: Patient signed out to oncoming team, who will continue care. Plan: Follow-up on CT.    Final Clinical Impressions(s) / ED Diagnoses     ICD-10-CM   1. Suprapubic pain  R10.24     2. Urinary retention  R33.9       ED Discharge Orders     None         Discharge Instructions Discussed with and Provided to Patient:   Discharge Instructions   None      Vicky Charleston, PA-C 03/09/24 [id number]    Midge Golas, MD 03/09/24 [phone]

## 2024-03-09 NOTE — ED Notes (Signed)
 Pt taken for CT

## 2024-03-09 NOTE — H&P (Signed)
 [facility] Pre-Operative H&P  Subjective Chief Complaint: urinary retention  History of Present Illness: [name] is a 73 y.o. female who presents to the ED due to pain, nausea and urinary retention on POD#26.  S/p Anterior repair, sacrospinous hysteropexy, midurethral sling, cystoscopy, bladder biopsy by Dr. [name] on 02/12/24 Postoperative course complicated by urinary retention evaluated in the office on POD#1 with foley replaced after failing outpatient void trial.  ED evaluation 02/15/24 due to urinary retention when her foley catheter stopped draining. Foley catheter was replaced with resolution of suprapubic pressure.  Office void trial on POD#7 backfilled for and voided with PVR . Foley catheter was replaced.  Office void trial on POD#9 backfilled for and voided 60mL with PVR . Foley catheter was replaced.  Evaluated in the office POD#17, backfilled for with leakage noted. Patient was started on Gemtesa and treated for presumed UTI with Bactrim . UA + leuk/protein/heme. Urine culture with multiple species.   Soaks 3-4 pads with 1 diaper since foley removed, uses 4-5 pads/night and denies urge or ability to void. Started Gemtesa 5 days ago in addition to Trospium  last Thursday.  Urinary leakage started to reduce and stopped yesterday with inability to void. Vomiting Thursday, history of microscopic colitis Pain started Thursday afternoon, emesis Thursday night.  Dry mouth for the past few days Reports chills, also reports chills with colitis flares last 9 months-1 year ago with similar symptoms Minimal vaginally bleeding and denies vaginal discharge. Burning with urination for around 1 week with blood tinged urine that resolved after Bactrim . Reports left sided back pain with baseline back pain. 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 2009 hyperplastic.    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 [facility]);   treated w/ IV iron ,  last x4  01/ 2025   OA (osteoarthritis)    back, fingers with joint pain and swelling.  chronic back pain   OAB (overactive bladder)    OSA on CPAP 08/2021   wears CPAP nightly;   followed by dr buck;   HST in epic 08-23-2021   moderate osa   Osteopenia 01/2014   T score -1.1 FRAX 14%/0.5%. Stable from prior DEXA   PONV (postoperative nausea and vomiting)    Presence of pessary    Seasonal allergies    SUI (stress urinary incontinence, female)    Uterovaginal prolapse, incomplete      Past Surgical History:  Procedure Laterality Date   BELPHAROPTOSIS REPAIR Bilateral 12/14/2017   BLADDER SUSPENSION N/A 02/12/2024   Procedure: CREATION, URETHRAL SLING, RETROPUBIC APPROACH, USING POLYPROPYLENE TAPE;  Surgeon: [name] Rosaline SAILOR, MD;  Location: [facility] OR;  Service: Gynecology;  Laterality: N/A;   BUBBLE STUDY  02/16/2021   Procedure: BUBBLE STUDY;  Surgeon: Alveta Aleene PARAS, MD;  Location: [facility] ENDOSCOPY;  Service: Cardiovascular;;    CARPAL TUNNEL RELEASE Right 08/26/2019   @[facility]    by Dr Addie ORIN MEDIATE RELEASE Left 06/24/2019   @[facility]   by Dr Addie   CATARACT EXTRACTION W/ INTRAOCULAR LENS IMPLANT Bilateral 10/2017   COLONOSCOPY WITH ESOPHAGOGASTRODUODENOSCOPY (EGD)  02/10/2020   dr  stark   CYSTOCELE REPAIR N/A 02/12/2024   Procedure: COLPORRHAPHY, ANTERIOR, FOR CYSTOCELE REPAIR;  Surgeon: [name] Rosaline SAILOR, MD;  Location: [facility] OR;  Service: Gynecology;  Laterality: N/A;  Total time 1.5 hours   CYSTOSCOPY N/A 02/12/2024   Procedure: CYSTOSCOPY, Bladder Biopsy and fulgeration;  Surgeon: [name] Rosaline SAILOR, MD;  Location: [facility] OR;  Service: Gynecology;  Laterality: N/A;   GYNECOLOGIC CRYOSURGERY     yrs ago   HYSTEROSCOPY WITH D & C  12/07/2010   @ [facility] by Dr Rockney;   with resection of endometrial polyp   KNEE ARTHROSCOPY Left 2008   LUMBAR SPINE SURGERY     x 2    last one 2000   MASS EXCISION Right 08/15/2013   Procedure: RIGHT INDEX EXCISION MASS ;  Surgeon: Franky JONELLE Curia, MD;  Location: [facility];  Service: Orthopedics;  Laterality: Right;   NASAL SEPTUM SURGERY  1972   approx   OOPHORECTOMY Right 2001   REVERSE SHOULDER ARTHROPLASTY Left 12/11/2018   Procedure: LEFT REVERSE SHOULDER ARTHROPLASTY;  Surgeon: Addie Cordella Hamilton, MD;  Location: [facility] OR;  Service: Orthopedics;  Laterality: Left;   SHOULDER ARTHROSCOPY WITH OPEN ROTATOR CUFF REPAIR AND DISTAL CLAVICLE ACROMINECTOMY Right 11/26/2013   Procedure: RIGHT SHOULDER ARTHROSCOPY WITH MINI OPEN ROTATOR CUFF REPAIR AND DISTAL CLAVICLE RESECTION, SUBACROMIAL DECOMPRESSION, POSSIBLE [facility] PATCH.;  Surgeon: Maude LELON Right, MD;  Location: [facility] OR;  Service: Orthopedics;  Laterality: Right;   TEE WITHOUT CARDIOVERSION N/A 02/16/2021   Procedure: TRANSESOPHAGEAL ECHOCARDIOGRAM (TEE);  Surgeon: Alveta Aleene PARAS, MD;  Location: [facility] ENDOSCOPY;  Service: Cardiovascular;  Laterality: N/A;   TOTAL KNEE ARTHROPLASTY Right 03/21/2017   Procedure:  RIGHT TOTAL KNEE ARTHROPLASTY;  Surgeon: Right Maude LELON, MD;  Location: [facility] OR;  Service: Orthopedics;  Laterality: Right;   TOTAL KNEE ARTHROPLASTY Left 11/29/2022   Procedure: LEFT TOTAL KNEE ARTHROPLASTY;  Surgeon: Addie Cordella Hamilton, MD;  Location: [facility] ORS;  Service: Orthopedics;  Laterality: Left;   TUBAL LIGATION Bilateral    yrs ago   VAGINAL PROLAPSE REPAIR N/A 02/12/2024   Procedure: SACROSPINOUS HYSTEROPEXY;  Surgeon: [name] Rosaline SAILOR, MD;  Location: [facility] OR;  Service: Gynecology;  Laterality: N/A;  sacrospinuous hysteropexy (sacrospinuous ligament fixation)    is allergic to egg protein-containing drug products, etodolac, fish protein-containing drug products, omeprazole, pineapple, and gluten meal.   Family History  Problem Relation Age of Onset   Hypertension Mother    Heart disease Mother    Diabetes Father    Hypertension Father    Heart disease Father    Stroke Father    Diabetes Maternal Aunt    Diabetes Paternal Grandmother    Hypertension Paternal Grandfather    Stroke Paternal Grandfather     Social History   Tobacco Use   Smoking  status: Never    Passive exposure: Yes   Smokeless tobacco: Never  Vaping Use   Vaping status: Never Used  Substance Use Topics   Alcohol  use: Not Currently   Drug use: No     Review of Systems was negative for a full 10 system review except as noted in the History of Present Illness.   Current Facility-Administered Medications:    ceFAZolin  (ANCEF ) IVPB 2g/100 mL premix, 2 g, Intravenous, Once, Denninger, Jade M, RPH, Last Rate: 200 mL/hr at 03/09/24 0845, 2 g at 03/09/24 0845   metroNIDAZOLE  (FLAGYL ) IVPB 500 mg, 500 mg, Intravenous, Once, Meredith, Savannah F, PA-C   Romosozumab -aqqg (EVENITY ) 105 MG/1. injection 210 mg, 210 mg, Subcutaneous, Q30 days, Glennon Almarie POUR, MD  Current Outpatient Medications:    acetaminophen  (TYLENOL ) 500 MG tablet, Take 1,000-1,500 mg by mouth every 8 (eight) hours as needed  for moderate pain., Disp: , Rfl:    acetaminophen  (TYLENOL ) 500 MG tablet, Take 1 tablet (500 mg total) by mouth every 6 (six) hours as needed (pain)., Disp: 30 tablet, Rfl: 0   ALPHA LIPOIC ACID PO, Take 1,400 mg by mouth daily., Disp: , Rfl:    amoxicillin  (AMOXIL ) 500 MG tablet, 4 tabs 1 hour prior to the procedure, Disp: 4 tablet, Rfl: 0   Ascorbic Acid  (VITAMIN C ) 1000 MG tablet, Take 1,000 mg by mouth daily. With zinc, Disp: , Rfl:    aspirin  81 MG chewable tablet, Chew 1 tablet (81 mg total) by mouth 2 (two) times daily., Disp: 60 tablet, Rfl: 0   atenolol -chlorthalidone  (TENORETIC ) 50-25 MG tablet, TAKE 1 TABLET DAILY, Disp: 90 tablet, Rfl: 0   b complex vitamins tablet, Take 1 tablet by mouth at bedtime., Disp: , Rfl:    calcium  carbonate (OSCAL) 1500 (600 Ca) MG TABS tablet, Take 600 mg of elemental calcium  by mouth daily., Disp: , Rfl:    Cholecalciferol  (D 5000) 5000 units capsule, Take 5,000 Units by mouth daily., Disp: , Rfl:    Coenzyme Q10 (COQ10) 100 MG CAPS, Take 100 mg by mouth every evening. , Disp: , Rfl:    diclofenac  Sodium (VOLTAREN ) 1 % GEL, Apply 1 Application topically 2 (two) times daily., Disp: , Rfl:    diphenoxylate -atropine  (LOMOTIL ) 2.5-0.025 MG tablet, TAKE 1 TABLET BY MOUTH FOUR TIMES A DAY AS NEEDED FOR DIARRHEA OR LOOSE STOOLS, Disp: 80 tablet, Rfl: 2   ELDERBERRY PO, Take 250 mg by mouth daily as needed., Disp: , Rfl:    estradiol  (ESTRACE ) 0.1 MG/GM vaginal cream, Place 0.5g nightly for two weeks then twice a week after, Disp: 42.5 g, Rfl: 11   folic acid  (FOLVITE ) 800 MCG tablet, Take 800 mcg by mouth every evening. , Disp: , Rfl:    Glutathione 500 MG CAPS, Take 500 mg by mouth daily., Disp: , Rfl:    ketotifen  (ZADITOR ) 0.025 % ophthalmic solution, Place 2 drops into both eyes 2 (two) times daily as needed (allergies)., Disp: , Rfl:    KLOR-CON  M20 20 MEQ tablet, TAKE 1 TABLET TWICE A DAY, Disp: 180 tablet, Rfl: 1   loperamide (IMODIUM A-D) 2 MG tablet,  Take 2-4 mg by mouth 4 (four) times daily as needed for diarrhea or loose stools., Disp: , Rfl:    Melatonin 3 MG TABS, Take 3 mg by mouth at bedtime. , Disp: , Rfl:    METAMUCIL FIBER PO, Take 2 tablets by mouth daily as needed (constipation)., Disp: , Rfl:    methocarbamol  (ROBAXIN ) 500 MG  tablet, TAKE 1 TABLET BY MOUTH EVERY 8 HOURS AS NEEDED FOR MUSCLE SPASMS, Disp: 90 tablet, Rfl: 3   Multiple Vitamin (MULTIVITAMIN WITH MINERALS) TABS tablet, Take 1 tablet by mouth at bedtime., Disp: , Rfl:    Multiple Vitamins-Minerals (ZINC PO), Take 1 Dose by mouth daily. 1 dose = 1 dropper full of liquid zinc, Disp: , Rfl:    OVER THE COUNTER MEDICATION, Take 1 Dose by mouth at bedtime. Lemon Balm, Disp: , Rfl:    OVER THE COUNTER MEDICATION, Take 1 Dose by mouth at bedtime. Cats Claw liquid daily, Disp: , Rfl:    OVER THE COUNTER MEDICATION, Apply 1 Application topically at bedtime. Magnesium  lotion, Disp: , Rfl:    saccharomyces boulardii (FLORASTOR) 250 MG capsule, Take 250 mg by mouth daily., Disp: , Rfl:    simvastatin  (ZOCOR ) 20 MG tablet, TAKE 1 TABLET DAILY, Disp: 90 tablet, Rfl: 0   Trospium  Chloride 60 MG CP24, Take 1 capsule (60 mg total) by mouth daily., Disp: 30 capsule, Rfl: 5   TURMERIC CURCUMIN PO, Take 630 mg by mouth daily., Disp: , Rfl:    Objective Vitals:   03/09/24 0600 03/09/24 0833  BP: 121/72 112/72  Pulse: 83 76  Resp:  19  Temp:  97.8 F (36.6 C)  SpO2: 99% 100%    Gen: NAD CV: S1 S2 RRR Lungs: no acute resp distress Abd: soft, mildly tender to palpation.  GU: No CVA tenderness, pain with palpation of L SI joint. Foley in place with in bag     Latest Ref Rng & Units 03/09/2024    5:19 AM 11/13/2023    1:32 PM 07/24/2023    1:45 PM  CBC  WBC 4.0 - 10.5 K/uL 11.0  9.3  7.4   Hemoglobin 12.0 - 15.0 g/dL 86.7  85.5  85.3   Hematocrit 36.0 - 46.0 % 41.3  43.1  43.9   Platelets 150 - 400 K/uL 314  191  199       Latest Ref Rng & Units 03/09/2024    5:19 AM  02/12/2024   10:30 AM 08/23/2023    3:07 PM  CMP  Glucose 70 - 99 mg/dL 887  86  889   BUN 8 - 23 mg/dL 13  13  17    Creatinine 0.44 - 1.00 mg/dL 9.18  9.09  9.18   Sodium 135 - 145 mmol/L 137  134  139   Potassium 3.5 - 5.1 mmol/L 2.9  3.1  3.8   Chloride 98 - 111 mmol/L 99  96  102   CO2 22 - 32 mmol/L 23  24  28    Calcium  8.9 - 10.3 mg/dL 9.4  9.2  9.9   Total Protein 6.1 - 8.1 g/dL   7.1   Total Bilirubin 0.2 - 1.2 mg/dL   0.7   AST 10 - 35 U/L   37   ALT 6 - 29 U/L   24    CT abd/pelvis with contrast 03/09/24 EXAM: CT ABDOMEN AND PELVIS WITH CONTRAST 03/09/2024 06:28:31 AM   TECHNIQUE: CT of the abdomen and pelvis was performed with the administration of 75 mL of iohexol  (OMNIPAQUE ) 350 MG/ML injection. Multiplanar reformatted images are provided for review. Automated exposure control, iterative reconstruction, and/or weight-based adjustment of the mA/kV was utilized to reduce the radiation dose to as low as reasonably achievable.   COMPARISON: CT with contrast 01/01/2020.   CLINICAL HISTORY: Abdominal pain, acute, nonlocalized. Patient had bladder surgery on  November 3 and is leaking urine since the Foley was removed.   FINDINGS:   LOWER CHEST: The cardiac size is normal. There is a small hiatal hernia. Linear scarring or atelectasis in the lung bases without infiltrates.   LIVER: The liver is unremarkable.   GALLBLADDER AND BILE DUCTS: Gallbladder is unremarkable. No biliary ductal dilatation.   SPLEEN: No acute abnormality.   PANCREAS: No acute abnormality.   ADRENAL GLANDS: There is no adrenal mass.   KIDNEYS, URETERS AND BLADDER: There is a 1.8 cm Bosniak type 1 cyst in the superior pole of the left kidney, Hounsfield density is 13.2. No follow-up imaging is recommended.   There is no renal mass enhancement. No urinary stone or obstruction. The bladder is contracted around a Foley balloon and displaced into the right pelvis due to a  heterogeneous thin-walled anterior left pelvic collection with a Hounsfield density of 39-42. This is most likely a hematoma and measures 10.6 x 11.5 x 9.5 cm. There is no air in the collection. There are no overt adjacent inflammatory changes such as would be expected with an abscess, although infection is not strictly excluded.   No perinephric or periureteral stranding.   GI AND BOWEL: There is fluid filling of the stomach with normal wall thickness. Fluid-filled, mildly dilated small bowel loops in the abdomen interspersed with decompressed segments, maximum small bowel caliber 3.1 cm.   Findings could be due to a low-grade small bowel obstruction or ileus. The appendix is normal in caliber. There is fluid in the ascending and transverse colon. No evidence of colitis or diverticulitis.   PERITONEUM AND RETROPERITONEUM: There is minimal pelvic ascites posteriorly. No free air. No free hemorrhage or other localizing process.   VASCULATURE: Aorta is normal in caliber. Heavy aortic calcific plaque without aneurysm, with branch vessel atherosclerosis.   LYMPH NODES: No lymphadenopathy.   REPRODUCTIVE ORGANS: The uterus and ovaries are not enlarged.   BONES AND SOFT TISSUES: There is osteopenia and advanced degenerative change of the lumbar spine, mild dextroscoliosis and a grade 1 L2-L3 anterolisthesis, likely degenerative. Ankylosis of the right sacroiliac joint. Bilateral hip degenerative joint disease. No focal soft tissue abnormality.   IMPRESSION: 1. Large anterior left pelvic collection, likely hematoma, measuring 10.6 x 11.5 x 9.5 cm, displacing the bladder into the right pelvis; no gas or overt adjacent inflammatory change identified, however infection is not excluded. 2.   Findings may reflect a low-grade small bowel obstruction versus ileus. A single transition point could not be located. Osteopenia and degenerative change. [name] [name] [name]   Electronically signed by:  Francis Quam MD 03/09/2024 07:07 AM EST RP Workstation: [id number]   Assessment/ Plan  Assessment: The patient is a 73 y.o. year old admitted for observation due to pelvic hematoma, ileus vs. SBO, and abdominal pain.  Plan: - pelvic hematoma - CT abd/pelvis with 10.6 x 11.5 x 9.5 cm fluid collection displacing bladder likely contributing to OAB symptoms and urinary retention. Hgb stable at 13.2. Reviewed images with IR, does not recommend drainage at this time due to lack of liquid component. Reassess if clinical change, repeat CBC tomorrow or if clinical change. Started cefazolin  and flagyl  due to size of hematoma in the retropubic space adjacent to midurethral sling.  - Ileus vs. SBO - tolerating some PO diet prior to admission with last BM 2 days ago. Pending general surgery consult. NPO due to nausea with IV zofran  ordered. Added phenergan  due to persistent nausea and hypokalemia. IVF with D5  1/2NS with 10mEq/L potassium due to lack of home dose of potassium supplementation at home. Encouraged ambulation. Will follow recommendation from gen surg. Simethicone PRN gas pain. - Gen - pain well controlled with IV pain medications while NPO, will transition to scheduled tylenol /NSAIDs and opioid use PRN moderate-severe pain with GI recovery - Cardio/pulm - RRR, no acute resp distress. OSA and may require supplemental O2 while asleep - Urinary retention - foley replaced with , will monitor UOP with Cr 0.81. Continue to monitor for foley catheter drainage due to prior retention with foley in place requiring ED evaluation. Discontinued Gemtesa and trospium  due to possible anti-cholinergic side effects - H/o CVA - SCDs in place and heparin prophylaxis due to history of CVA. Encouraged ambulation - Dispo - plan for observation until GI recovery  Case reviewed with Dr. [name].   Lianne ONEIDA Gillis, MD

## 2024-03-10 DIAGNOSIS — N9489 Other specified conditions associated with female genital organs and menstrual cycle: Secondary | ICD-10-CM | POA: Diagnosis not present

## 2024-03-10 DIAGNOSIS — K9189 Other postprocedural complications and disorders of digestive system: Secondary | ICD-10-CM | POA: Diagnosis not present

## 2024-03-10 DIAGNOSIS — E876 Hypokalemia: Principal | ICD-10-CM

## 2024-03-10 LAB — COMPREHENSIVE METABOLIC PANEL WITH GFR
ALT: 12 U/L (ref 0–44)
AST: 25 U/L (ref 15–41)
Albumin: 3.1 g/dL — ABNORMAL LOW (ref 3.5–5.0)
Alkaline Phosphatase: 40 U/L (ref 38–126)
Anion gap: 10 (ref 5–15)
BUN: 7 mg/dL — ABNORMAL LOW (ref 8–23)
CO2: 25 mmol/L (ref 22–32)
Calcium: 8.8 mg/dL — ABNORMAL LOW (ref 8.9–10.3)
Chloride: 104 mmol/L (ref 98–111)
Creatinine, Ser: 0.74 mg/dL (ref 0.44–1.00)
GFR, Estimated: >60 mL/min (ref >60)
Glucose, Bld: 92 mg/dL (ref 70–99)
Potassium: 3.3 mmol/L — ABNORMAL LOW (ref 3.5–5.1)
Sodium: 139 mmol/L (ref 135–145)
Total Bilirubin: 0.8 mg/dL (ref 0.0–1.2)
Total Protein: 5.9 g/dL — ABNORMAL LOW (ref 6.5–8.1)

## 2024-03-10 LAB — CBC WITH DIFFERENTIAL/PLATELET
Abs Immature Granulocytes: 0.02 K/uL (ref 0.00–0.07)
Basophils Absolute: 0 K/uL (ref 0.0–0.1)
Basophils Relative: 1 %
Eosinophils Absolute: 0.2 K/uL (ref 0.0–0.5)
Eosinophils Relative: 3 %
HCT: 36.6 % (ref 36.0–46.0)
Hemoglobin: 11.6 g/dL — ABNORMAL LOW (ref 12.0–15.0)
Immature Granulocytes: 0 %
Lymphocytes Relative: 34 %
Lymphs Abs: 2.3 K/uL (ref 0.7–4.0)
MCH: 29.4 pg (ref 26.0–34.0)
MCHC: 31.7 g/dL (ref 30.0–36.0)
MCV: 92.9 fL (ref 80.0–100.0)
Monocytes Absolute: 0.7 K/uL (ref 0.1–1.0)
Monocytes Relative: 10 %
Neutro Abs: 3.7 K/uL (ref 1.7–7.7)
Neutrophils Relative %: 52 %
Platelets: 275 K/uL (ref 150–400)
RBC: 3.94 MIL/uL (ref 3.87–5.11)
RDW: 14.6 % (ref 11.5–15.5)
WBC: 7 K/uL (ref 4.0–10.5)
nRBC: 0 % (ref 0.0–0.2)

## 2024-03-10 MED ORDER — POTASSIUM CHLORIDE 20 MEQ PO PACK
20.0000 meq | PACK | Freq: Two times a day (BID) | ORAL | Status: DC
  Administered 2024-03-10 – 2024-03-13 (×7): 20 meq via ORAL
  Filled 2024-03-10 (×7): qty 1

## 2024-03-10 MED ORDER — POLYETHYLENE GLYCOL 3350 17 G PO PACK
17.0000 g | PACK | Freq: Every day | ORAL | Status: DC
  Administered 2024-03-10 – 2024-03-11 (×2): 17 g via ORAL
  Filled 2024-03-10 (×2): qty 1

## 2024-03-10 MED ORDER — ACETAMINOPHEN 325 MG PO TABS
650.0000 mg | ORAL_TABLET | Freq: Four times a day (QID) | ORAL | Status: DC
  Administered 2024-03-10 – 2024-03-13 (×14): 650 mg via ORAL
  Filled 2024-03-10 (×13): qty 2

## 2024-03-10 MED ORDER — CHLORHEXIDINE GLUCONATE CLOTH 2 % EX PADS
6.0000 | MEDICATED_PAD | Freq: Every day | CUTANEOUS | Status: DC
  Administered 2024-03-10 – 2024-03-13 (×4): 6 via TOPICAL

## 2024-03-10 MED ORDER — OXYCODONE HCL 5 MG PO TABS: 5.0000 mg | ORAL_TABLET | Freq: Four times a day (QID) | ORAL | Status: DC | PRN

## 2024-03-10 NOTE — Plan of Care (Signed)
  Problem: Education: Goal: Knowledge of the prescribed therapeutic regimen will improve Outcome: Progressing Goal: Understanding of sexual limitations or changes related to disease process or condition will improve Outcome: Progressing Goal: Individualized Educational Video(s) Outcome: Progressing   Problem: Self-Concept: Goal: Communication of feelings regarding changes in body function or appearance will improve Outcome: Progressing   Problem: Skin Integrity: Goal: Demonstration of wound healing without infection will improve Outcome: Progressing   Problem: Education: Goal: Knowledge of General Education information will improve Description: Including pain rating scale, medication(s)/side effects and non-pharmacologic comfort measures Outcome: Progressing   Problem: Health Behavior/Discharge Planning: Goal: Ability to manage health-related needs will improve Outcome: Progressing   Problem: Clinical Measurements: Goal: Ability to maintain clinical measurements within normal limits will improve Outcome: Progressing Goal: Will remain free from infection Outcome: Progressing Goal: Diagnostic test results will improve Outcome: Progressing Goal: Respiratory complications will improve Outcome: Progressing Goal: Cardiovascular complication will be avoided Outcome: Progressing   Problem: Activity: Goal: Risk for activity intolerance will decrease Outcome: Progressing   Problem: Nutrition: Goal: Adequate nutrition will be maintained Outcome: Progressing   Problem: Coping: Goal: Level of anxiety will decrease Outcome: Progressing   Problem: Elimination: Goal: Will not experience complications related to bowel motility Outcome: Progressing Goal: Will not experience complications related to urinary retention Outcome: Progressing   Problem: Pain Managment: Goal: General experience of comfort will improve and/or be controlled Outcome: Progressing   Problem:  Safety: Goal: Ability to remain free from injury will improve Outcome: Progressing   Problem: Skin Integrity: Goal: Risk for impaired skin integrity will decrease Outcome: Progressing

## 2024-03-10 NOTE — Plan of Care (Signed)
   Problem: Clinical Measurements: Goal: Diagnostic test results will improve Outcome: Progressing   Problem: Activity: Goal: Risk for activity intolerance will decrease Outcome: Progressing   Problem: Nutrition: Goal: Adequate nutrition will be maintained Outcome: Progressing

## 2024-03-10 NOTE — Progress Notes (Signed)
 Subjective: No significant bowel complaints.  No nausea.  No bowel function yet.    ROS: See above, otherwise other systems negative  Objective: Vital signs in last 24 hours: Temp:  [97.6 F (36.4 C)-98.5 F (36.9 C)] 97.9 F (36.6 C) (11/30 0504) Pulse Rate:  [80-96] 83 (11/30 0504) Resp:  [15-18] 16 (11/30 0504) BP: (97-117)/(57-71) 117/71 (11/30 0504) SpO2:  [96 %-100 %] 97 % (11/30 0504)    Intake/Output from previous day: 11/29 0701 - 11/30 0700 In: 972.2 [I.V.:659.9; IV Piggyback:312.2] Out: 3050 [Urine:3050] Intake/Output this shift: No intake/output data recorded.  PE: Abd: soft, ND, minimal central pelvic tenderness  Lab Results:  Recent Labs    03/09/24 0519 03/10/24 0108  WBC 11.0* 7.0  HGB 13.2 11.6*  HCT 41.3 36.6  PLT 314 275   BMET Recent Labs    03/09/24 0519 03/10/24 0108  NA 137 139  K 2.9* 3.3*  CL 99 104  CO2 23 25  GLUCOSE 112* 92  BUN 13 7*  CREATININE 0.81 0.74  CALCIUM  9.4 8.8*   PT/INR No results for input(s): LABPROT, INR in the last 72 hours. CMP     Component Value Date/Time   NA 139 03/10/2024 0108   K 3.3 (L) 03/10/2024 0108   CL 104 03/10/2024 0108   CO2 25 03/10/2024 0108   GLUCOSE 92 03/10/2024 0108   BUN 7 (L) 03/10/2024 0108   CREATININE 0.74 03/10/2024 0108   CREATININE 0.81 08/23/2023 1507   CALCIUM  8.8 (L) 03/10/2024 0108   PROT 5.9 (L) 03/10/2024 0108   ALBUMIN 3.1 (L) 03/10/2024 0108   AST 25 03/10/2024 0108   ALT 12 03/10/2024 0108   ALKPHOS 40 03/10/2024 0108   BILITOT 0.8 03/10/2024 0108   GFRNONAA >60 03/10/2024 0108   GFRAA >60 12/07/2018 1403   Lipase     Component Value Date/Time   LIPASE 58 12/02/2019 1626       Studies/Results: CT ABDOMEN PELVIS W CONTRAST Result Date: 03/09/2024 EXAM: CT ABDOMEN AND PELVIS WITH CONTRAST 03/09/2024 06:28:31 AM TECHNIQUE: CT of the abdomen and pelvis was performed with the administration of 75 mL of iohexol  (OMNIPAQUE ) 350 MG/ML  injection. Multiplanar reformatted images are provided for review. Automated exposure control, iterative reconstruction, and/or weight-based adjustment of the mA/kV was utilized to reduce the radiation dose to as low as reasonably achievable. COMPARISON: CT with contrast 01/01/2020. CLINICAL HISTORY: Abdominal pain, acute, nonlocalized. Patient had bladder surgery on November 3 and is leaking urine since the Foley was removed. FINDINGS: LOWER CHEST: The cardiac size is normal. There is a small hiatal hernia. Linear scarring or atelectasis in the lung bases without infiltrates. LIVER: The liver is unremarkable. GALLBLADDER AND BILE DUCTS: Gallbladder is unremarkable. No biliary ductal dilatation. SPLEEN: No acute abnormality. PANCREAS: No acute abnormality. ADRENAL GLANDS: There is no adrenal mass. KIDNEYS, URETERS AND BLADDER: There is a 1.8 cm Bosniak type 1 cyst in the superior pole of the left kidney, Hounsfield density is 13.2. No follow-up imaging is recommended. There is no renal mass enhancement. No urinary stone or obstruction. The bladder is contracted around a Foley balloon and displaced into the right pelvis due to a heterogeneous thin-walled anterior left pelvic collection with a Hounsfield density of 39-42. This is most likely a hematoma and measures 10.6 x 11.5 x 9.5 cm. There is no air in the collection. There are no overt adjacent inflammatory changes such as would be expected with an abscess, although infection  is not strictly excluded. No perinephric or periureteral stranding. GI AND BOWEL: There is fluid filling of the stomach with normal wall thickness. Fluid-filled, mildly dilated small bowel loops in the abdomen interspersed with decompressed segments, maximum small bowel caliber 3.1 cm. Findings could be due to a low-grade small bowel obstruction or ileus. The appendix is normal in caliber. There is fluid in the ascending and transverse colon. No evidence of colitis or diverticulitis.  PERITONEUM AND RETROPERITONEUM: There is minimal pelvic ascites posteriorly. No free air. No free hemorrhage or other localizing process. VASCULATURE: Aorta is normal in caliber. Heavy aortic calcific plaque without aneurysm, with branch vessel atherosclerosis. LYMPH NODES: No lymphadenopathy. REPRODUCTIVE ORGANS: The uterus and ovaries are not enlarged. BONES AND SOFT TISSUES: There is osteopenia and advanced degenerative change of the lumbar spine, mild dextroscoliosis and a grade 1 L2-L3 anterolisthesis, likely degenerative. Ankylosis of the right sacroiliac joint. Bilateral hip degenerative joint disease. No focal soft tissue abnormality. IMPRESSION: 1. Large anterior left pelvic collection, likely hematoma, measuring 10.6 x 11.5 x 9.5 cm, displacing the bladder into the right pelvis; no gas or overt adjacent inflammatory change identified, however infection is not excluded. 2. Findings may reflect a low-grade small bowel obstruction versus ileus. A single transition point could not be located. Osteopenia and degenerative change. [name] [name] [name] Electronically signed by: Francis Quam MD 03/09/2024 07:07 AM EST RP Workstation: [id number]    Anti-infectives: Anti-infectives (From admission, onward)    Start     Dose/Rate Route Frequency Ordered Stop   03/09/24 0830  metroNIDAZOLE  (FLAGYL ) IVPB 500 mg        500 mg 100 mL/hr over 60 Minutes Intravenous  Once 03/09/24 0826 03/09/24 1041   03/09/24 0830  ceFAZolin  (ANCEF ) IVPB 2g/100 mL premix        2 g 200 mL/hr over 30 Minutes Intravenous  Once 03/09/24 0829 03/09/24 0936        Assessment/Plan Likely mild ileus following urogyn surgery with pelvic hematoma -adv to [facility] and add miralax  -suspect she may have a mild ileus, but she has no complaints this morning so will adv diet and add bowel regimen -defer further care to primary service regarding recent surgery -mobilize   FEN - CLD, replacing K per primary VTE - heparin ID - ancef /flagyl  completed  per primary  I reviewed [facility] notes, last 24 h vitals and pain scores, last 48 h intake and output, last 24 h labs and trends, and last 24 h imaging results.   LOS: 0 days    Burnard FORBES Banter , [facility] Surgery 03/10/2024, 8:34 AM Please see Amion for pager number during day hours 7:00am-4:30pm or 7:00am -11:30am on weekends

## 2024-03-10 NOTE — Progress Notes (Signed)
 [facility] Pre-Operative H&P  Subjective Chief Complaint: urinary retention  History of Present Illness: [name] is a 73 y.o. female HD#2 admitted from ED due to pain, nausea and urinary retention on POD#26.  Reports feeling hungry this morning. Denies N/V, passing flatus or bowel movements. History of microscopic colitis Minimal 2/10 pain, resting comfortable on bed Foley in place with yellow urine Denies vaginal bleeding or discharge.   From prior note: S/p Anterior repair, sacrospinous hysteropexy, midurethral sling, cystoscopy, bladder biopsy by Dr. [name] on 02/12/24 Postoperative course complicated by urinary retention evaluated in the office on POD#1 with foley replaced after failing outpatient void trial.  ED evaluation 02/15/24 due to urinary retention when her foley catheter stopped draining. Foley catheter was replaced with resolution of suprapubic pressure.  Office void trial on POD#7 backfilled for and voided with PVR . Foley catheter was replaced.  Office void trial on POD#9 backfilled for and voided 60mL with PVR . Foley catheter was replaced.  Evaluated in the office POD#17, backfilled for with leakage noted. Patient was started on Gemtesa and treated for presumed UTI with Bactrim . UA + leuk/protein/heme. Urine culture with multiple species.   Soaks 3-4 pads with 1 diaper since foley removed, uses 4-5 pads/night and denies urge or ability to void. Added Trospium  03/07/24, followed by pain and emesis Thursday night.  Urinary leakage started to reduce and stopped 03/08/24 with inability to void. Dry mouth for the past few days Reports chills, also reports chills with colitis flares last 9 months-1 year ago with similar symptoms Minimal vaginally bleeding and denies vaginal discharge. Burning with urination for around 1 week with blood tinged urine that resolved after Bactrim . Reports left sided back pain with  baseline back pain. 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 2009 hyperplastic.    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 [facility]);   treated w/ IV iron ,  last x4  01/ 2025   OA (osteoarthritis)    back, fingers with joint pain and swelling.  chronic back pain   OAB (overactive bladder)    OSA on CPAP 08/2021   wears CPAP nightly;   followed by dr buck;   HST in epic 08-23-2021   moderate osa   Osteopenia 01/2014   T score -1.1 FRAX 14%/0.5%. Stable from prior DEXA   PONV (postoperative nausea and vomiting)    Presence of pessary  Seasonal allergies    SUI (stress urinary incontinence, female)    Uterovaginal prolapse, incomplete      Past Surgical History:  Procedure Laterality Date   BELPHAROPTOSIS REPAIR Bilateral 12/14/2017   BLADDER SUSPENSION N/A 02/12/2024   Procedure: CREATION, URETHRAL SLING, RETROPUBIC APPROACH, USING POLYPROPYLENE TAPE;  Surgeon: [name] Rosaline SAILOR, MD;  Location: [facility] OR;  Service: Gynecology;   Laterality: N/A;   BUBBLE STUDY  02/16/2021   Procedure: BUBBLE STUDY;  Surgeon: Alveta Aleene PARAS, MD;  Location: [facility] ENDOSCOPY;  Service: Cardiovascular;;   CARPAL TUNNEL RELEASE Right 08/26/2019   @[facility]    by Dr Addie ORIN MEDIATE RELEASE Left 06/24/2019   @[facility]   by Dr Addie   CATARACT EXTRACTION W/ INTRAOCULAR LENS IMPLANT Bilateral 10/2017   COLONOSCOPY WITH ESOPHAGOGASTRODUODENOSCOPY (EGD)  02/10/2020   dr  stark   CYSTOCELE REPAIR N/A 02/12/2024   Procedure: COLPORRHAPHY, ANTERIOR, FOR CYSTOCELE REPAIR;  Surgeon: [name] Rosaline SAILOR, MD;  Location: [facility] OR;  Service: Gynecology;  Laterality: N/A;  Total time 1.5 hours   CYSTOSCOPY N/A 02/12/2024   Procedure: CYSTOSCOPY, Bladder Biopsy and fulgeration;  Surgeon: [name] Rosaline SAILOR, MD;  Location: [facility] OR;  Service: Gynecology;  Laterality: N/A;   GYNECOLOGIC CRYOSURGERY     yrs ago   HYSTEROSCOPY WITH D & C  12/07/2010   @ [facility] by Dr Rockney;   with resection of endometrial polyp   KNEE ARTHROSCOPY Left 2008   LUMBAR SPINE SURGERY     x 2    last one 2000   MASS EXCISION Right 08/15/2013   Procedure: RIGHT INDEX EXCISION MASS ;  Surgeon: Franky JONELLE Curia, MD;  Location: [facility];  Service: Orthopedics;  Laterality: Right;   NASAL SEPTUM SURGERY  1972   approx   OOPHORECTOMY Right 2001   REVERSE SHOULDER ARTHROPLASTY Left 12/11/2018   Procedure: LEFT REVERSE SHOULDER ARTHROPLASTY;  Surgeon: Addie Cordella Hamilton, MD;  Location: [facility] OR;  Service: Orthopedics;  Laterality: Left;   SHOULDER ARTHROSCOPY WITH OPEN ROTATOR CUFF REPAIR AND DISTAL CLAVICLE ACROMINECTOMY Right 11/26/2013   Procedure: RIGHT SHOULDER ARTHROSCOPY WITH MINI OPEN ROTATOR CUFF REPAIR AND DISTAL CLAVICLE RESECTION, SUBACROMIAL DECOMPRESSION, POSSIBLE [facility] PATCH.;  Surgeon: Maude LELON Right, MD;  Location: [facility] OR;  Service: Orthopedics;  Laterality: Right;   TEE WITHOUT CARDIOVERSION N/A 02/16/2021   Procedure: TRANSESOPHAGEAL ECHOCARDIOGRAM (TEE);   Surgeon: Alveta Aleene PARAS, MD;  Location: [facility] ENDOSCOPY;  Service: Cardiovascular;  Laterality: N/A;   TOTAL KNEE ARTHROPLASTY Right 03/21/2017   Procedure: RIGHT TOTAL KNEE ARTHROPLASTY;  Surgeon: Right Maude LELON, MD;  Location: [facility] OR;  Service: Orthopedics;  Laterality: Right;   TOTAL KNEE ARTHROPLASTY Left 11/29/2022   Procedure: LEFT TOTAL KNEE ARTHROPLASTY;  Surgeon: Addie Cordella Hamilton, MD;  Location: [facility] ORS;  Service: Orthopedics;  Laterality: Left;   TUBAL LIGATION Bilateral    yrs ago   VAGINAL PROLAPSE REPAIR N/A 02/12/2024   Procedure: SACROSPINOUS HYSTEROPEXY;  Surgeon: [name] Rosaline SAILOR, MD;  Location: [facility] OR;  Service: Gynecology;  Laterality: N/A;  sacrospinuous hysteropexy (sacrospinuous ligament fixation)    is allergic to egg protein-containing drug products, etodolac, fish protein-containing drug products, omeprazole, pineapple, and gluten meal.   Family History  Problem Relation Age of Onset   Hypertension Mother    Heart disease Mother    Diabetes Father    Hypertension Father    Heart disease Father    Stroke Father    Diabetes Maternal Aunt    Diabetes Paternal Grandmother  Hypertension Paternal Grandfather    Stroke Paternal Grandfather     Social History   Tobacco Use   Smoking status: Never    Passive exposure: Yes   Smokeless tobacco: Never  Vaping Use   Vaping status: Never Used  Substance Use Topics   Alcohol  use: Not Currently   Drug use: No     Review of Systems was negative for a full 10 system review except as noted in the History of Present Illness.   Current Facility-Administered Medications:    Chlorhexidine  Gluconate Cloth 2 % PADS 6 each, 6 each, Topical, Daily, Izac Faulkenberry T, MD   dextrose  5 % and 0.45 % NaCl with KCl 10 mEq/L infusion, , Intravenous, Continuous, Guadlupe Dull T, MD, Last Rate: 40 mL/hr at 03/10/24 0305, Infusion Verify at 03/10/24 0305   heparin injection 5,000 Units, 5,000 Units, Subcutaneous, Q8H, Guadlupe Dull T,  MD, 5,000 Units at 03/10/24 0550   HYDROmorphone  (DILAUDID ) injection 0.2-0.6 mg, 0.2-0.6 mg, Intravenous, Q2H PRN, Guadlupe Dull T, MD   ondansetron  (ZOFRAN ) tablet 4 mg, 4 mg, Oral, Q6H PRN **OR** ondansetron  (ZOFRAN ) injection 4 mg, 4 mg, Intravenous, Q6H PRN, Guadlupe Dull T, MD   polyethylene glycol (MIRALAX  / GLYCOLAX ) packet 17 g, 17 g, Oral, Daily, Tammy Sor, PA-C   promethazine  (PHENERGAN ) injection 12.5 mg, 12.5 mg, Intramuscular, Q6H PRN, Guadlupe Dull T, MD, 12.5 mg at 03/09/24 1048   simethicone (MYLICON) chewable tablet 80 mg, 80 mg, Oral, QID PRN, Guadlupe Dull T, MD   Objective Vitals:   03/10/24 0504 03/10/24 0929  BP: 117/71 118/73  Pulse: 83 83  Resp: 16 17  Temp: 97.9 F (36.6 C) 98 F (36.7 C)  SpO2: 97% 97%    Intake/Output Summary (Last 24 hours) at 03/10/2024 0939 Last data filed at 03/10/2024 0500 Gross per 24 hour  Intake 768.27 ml  Output 3050 ml  Net -2281.73 ml     Gen: NAD CV: S1 S2 RRR Lungs: no acute resp distress Abd: soft, mildly tender to palpation in LLQ.  GU: No CVA tenderness, pain with palpation of L SI joint. Foley in place with yellow urine in bag     Latest Ref Rng & Units 03/10/2024    1:08 AM 03/09/2024    5:19 AM 11/13/2023    1:32 PM  CBC  WBC 4.0 - 10.5 K/uL 7.0  11.0  9.3   Hemoglobin 12.0 - 15.0 g/dL 88.3  86.7  85.5   Hematocrit 36.0 - 46.0 % 36.6  41.3  43.1   Platelets 150 - 400 K/uL 275  314  191       Latest Ref Rng & Units 03/10/2024    1:08 AM 03/09/2024    5:19 AM 02/12/2024   10:30 AM  CMP  Glucose 70 - 99 mg/dL 92  887  86   BUN 8 - 23 mg/dL 7  13  13    Creatinine 0.44 - 1.00 mg/dL 9.25  9.18  9.09   Sodium 135 - 145 mmol/L 139  137  134   Potassium 3.5 - 5.1 mmol/L 3.3  2.9  3.1   Chloride 98 - 111 mmol/L 104  99  96   CO2 22 - 32 mmol/L 25  23  24    Calcium  8.9 - 10.3 mg/dL 8.8  9.4  9.2   Total Protein 6.5 - 8.1 g/dL 5.9     Total Bilirubin 0.0 - 1.2 mg/dL 0.8     Alkaline Phos 38 - 126  U/L 40     AST 15 -  41 U/L 25     ALT 0 - 44 U/L 12      CT abd/pelvis with contrast 03/09/24 EXAM: CT ABDOMEN AND PELVIS WITH CONTRAST 03/09/2024 06:28:31 AM   TECHNIQUE: CT of the abdomen and pelvis was performed with the administration of 75 mL of iohexol  (OMNIPAQUE ) 350 MG/ML injection. Multiplanar reformatted images are provided for review. Automated exposure control, iterative reconstruction, and/or weight-based adjustment of the mA/kV was utilized to reduce the radiation dose to as low as reasonably achievable.   COMPARISON: CT with contrast 01/01/2020.   CLINICAL HISTORY: Abdominal pain, acute, nonlocalized. Patient had bladder surgery on November 3 and is leaking urine since the Foley was removed.   FINDINGS:   LOWER CHEST: The cardiac size is normal. There is a small hiatal hernia. Linear scarring or atelectasis in the lung bases without infiltrates.   LIVER: The liver is unremarkable.   GALLBLADDER AND BILE DUCTS: Gallbladder is unremarkable. No biliary ductal dilatation.   SPLEEN: No acute abnormality.   PANCREAS: No acute abnormality.   ADRENAL GLANDS: There is no adrenal mass.   KIDNEYS, URETERS AND BLADDER: There is a 1.8 cm Bosniak type 1 cyst in the superior pole of the left kidney, Hounsfield density is 13.2. No follow-up imaging is recommended.   There is no renal mass enhancement. No urinary stone or obstruction. The bladder is contracted around a Foley balloon and displaced into the right pelvis due to a heterogeneous thin-walled anterior left pelvic collection with a Hounsfield density of 39-42. This is most likely a hematoma and measures 10.6 x 11.5 x 9.5 cm. There is no air in the collection. There are no overt adjacent inflammatory changes such as would be expected with an abscess, although infection is not strictly excluded.   No perinephric or periureteral stranding.   GI AND BOWEL: There is fluid filling of the stomach with normal wall thickness.  Fluid-filled, mildly dilated small bowel loops in the abdomen interspersed with decompressed segments, maximum small bowel caliber 3.1 cm.   Findings could be due to a low-grade small bowel obstruction or ileus. The appendix is normal in caliber. There is fluid in the ascending and transverse colon. No evidence of colitis or diverticulitis.   PERITONEUM AND RETROPERITONEUM: There is minimal pelvic ascites posteriorly. No free air. No free hemorrhage or other localizing process.   VASCULATURE: Aorta is normal in caliber. Heavy aortic calcific plaque without aneurysm, with branch vessel atherosclerosis.   LYMPH NODES: No lymphadenopathy.   REPRODUCTIVE ORGANS: The uterus and ovaries are not enlarged.   BONES AND SOFT TISSUES: There is osteopenia and advanced degenerative change of the lumbar spine, mild dextroscoliosis and a grade 1 L2-L3 anterolisthesis, likely degenerative. Ankylosis of the right sacroiliac joint. Bilateral hip degenerative joint disease. No focal soft tissue abnormality.   IMPRESSION: 1. Large anterior left pelvic collection, likely hematoma, measuring 10.6 x 11.5 x 9.5 cm, displacing the bladder into the right pelvis; no gas or overt adjacent inflammatory change identified, however infection is not excluded. 2.   Findings may reflect a low-grade small bowel obstruction versus ileus. A single transition point could not be located. Osteopenia and degenerative change. [name] [name] [name]   Electronically signed by: Francis Quam MD 03/09/2024 07:07 AM EST RP Workstation: [id number]   Assessment/ Plan  Assessment: The patient is a 74 y.o. year old admitted for observation due to pelvic hematoma, ileus vs. SBO, and abdominal pain.  Plan: -  pelvic hematoma - CT abd/pelvis with 10.6 x 11.5 x 9.5 cm fluid collection displacing bladder likely contributing to OAB symptoms and urinary retention. Hgb trended down at 11.6 from 13.2, possibly due to hemodilution, will repeat  this afternoon. Reviewed images with IR, does not recommend drainage at this time due to lack of liquid component. Reassess if clinical change. Continue cefazolin  and flagyl  due to size of hematoma in the retropubic space adjacent to midurethral sling. Will transition to PO Flagyl  500mg  BID and Bactrim  160/800 mg BID if tolerating PO.  - Ileus vs. SBO - history of microscopic colitis. Tolerating some PO diet prior to admission with last BM 3 days ago. General surgery consulted and recommended clear liquids today. Denies nausea this morning and continue IV zofran  PRN. Added phenergan  if persistent nausea. Continue IVF with D5 1/2NS with 10mEq/L potassium due to lack of home dose of potassium supplementation, will discontinue when she is tolerating PO. Encouraged ambulation. Simethicone  PRN gas pain and resume miralax  once daily home dose (pt reports concerns with BID dosing due to microscopic colitis). If tolerating clears, can advance to regular diet - Gen - pain well controlled with IV pain medications while NPO, will transition to scheduled tylenol  and opioid use PRN moderate-severe pain if GI recovery with PO challenge  - Cardio/pulm - RRR, no acute resp distress. OSA but did not require supplemental O2 while asleep - Urinary retention - foley replaced with in ED, adequate UOP with Cr stable at 0.74 from 0.81. Continue to monitor for foley catheter drainage due to prior retention with foley in place requiring ED evaluation. Discontinued Gemtesa and trospium  due to possible anti-cholinergic side effects. Discussed void trial vs. Foley upon discharge with office void trial, pt desires to continue foley placement for comfort until reassessment outpatient. - H/o CVA - SCDs in place and heparin  prophylaxis due to history of CVA. Encouraged ambulation - Dispo - plan for observation until GI recovery, PO challenge with clear liquids today. Appreciate general surgery input.   Case reviewed with Dr.  [name].   Lianne ONEIDA Gillis, MD

## 2024-03-11 DIAGNOSIS — N9489 Other specified conditions associated with female genital organs and menstrual cycle: Secondary | ICD-10-CM | POA: Diagnosis not present

## 2024-03-11 DIAGNOSIS — K9189 Other postprocedural complications and disorders of digestive system: Secondary | ICD-10-CM | POA: Diagnosis not present

## 2024-03-11 MED ORDER — POLYETHYLENE GLYCOL 3350 17 G PO PACK
17.0000 g | PACK | Freq: Two times a day (BID) | ORAL | Status: DC
  Administered 2024-03-11 – 2024-03-13 (×4): 17 g via ORAL
  Filled 2024-03-11 (×4): qty 1

## 2024-03-11 NOTE — Plan of Care (Signed)
  Problem: Education: Goal: Knowledge of the prescribed therapeutic regimen will improve Outcome: Progressing Goal: Understanding of sexual limitations or changes related to disease process or condition will improve Outcome: Progressing Goal: Individualized Educational Video(s) Outcome: Progressing   Problem: Self-Concept: Goal: Communication of feelings regarding changes in body function or appearance will improve Outcome: Progressing   Problem: Skin Integrity: Goal: Demonstration of wound healing without infection will improve Outcome: Progressing   Problem: Education: Goal: Knowledge of General Education information will improve Description: Including pain rating scale, medication(s)/side effects and non-pharmacologic comfort measures Outcome: Progressing   Problem: Health Behavior/Discharge Planning: Goal: Ability to manage health-related needs will improve Outcome: Progressing   Problem: Clinical Measurements: Goal: Ability to maintain clinical measurements within normal limits will improve Outcome: Progressing Goal: Will remain free from infection Outcome: Progressing Goal: Diagnostic test results will improve Outcome: Progressing Goal: Respiratory complications will improve Outcome: Progressing Goal: Cardiovascular complication will be avoided Outcome: Progressing   Problem: Activity: Goal: Risk for activity intolerance will decrease Outcome: Progressing   Problem: Nutrition: Goal: Adequate nutrition will be maintained Outcome: Progressing   Problem: Coping: Goal: Level of anxiety will decrease Outcome: Progressing   Problem: Elimination: Goal: Will not experience complications related to bowel motility Outcome: Progressing Goal: Will not experience complications related to urinary retention Outcome: Progressing   Problem: Pain Managment: Goal: General experience of comfort will improve and/or be controlled Outcome: Progressing   Problem:  Safety: Goal: Ability to remain free from injury will improve Outcome: Progressing   Problem: Skin Integrity: Goal: Risk for impaired skin integrity will decrease Outcome: Progressing

## 2024-03-11 NOTE — Progress Notes (Signed)
 Urogynecology Progress Note  S/p Anterior repair, sacrospinous hysteropexy, midurethral sling, cystoscopy, bladder biopsy on 02/12/24.  HD#3 admitted from ED due to pain, nausea and urinary retention   Subjective: Overall pain is well controlled, 2/10. She is tolerating clears without nausea or vomiting. She has not passed flatus or had a bowel movement. She has not been ambulating.   Objective: Vitals:   03/10/24 2137 03/11/24 0522  BP: 111/71 108/62  Pulse: 85 69  Resp: 17 16  Temp: 99.3 F (37.4 C) 98.1 F (36.7 C)  SpO2: 96% 98%     Gen: NAD CV: S1 S2 RRR Lungs: no acute resp distress Abd: soft, non-tender GU:  Foley in place with yellow urine in bag     Latest Ref Rng & Units 03/10/2024    1:08 AM 03/09/2024    5:19 AM 11/13/2023    1:32 PM  CBC  WBC 4.0 - 10.5 K/uL 7.0  11.0  9.3   Hemoglobin 12.0 - 15.0 g/dL 88.3  86.7  85.5   Hematocrit 36.0 - 46.0 % 36.6  41.3  43.1   Platelets 150 - 400 K/uL 275  314  191    Lab Results  Component Value Date   NA 139 03/10/2024   K 3.3 (L) 03/10/2024   CL 104 03/10/2024   CO2 25 03/10/2024     Assessment: The patient is a 73 y.o. year old admitted for observation due to pelvic hematoma, ileus vs. SBO, and abdominal pain. HD#3   Plan: - pelvic hematoma - CT abd/pelvis with 10.6 x 11.5 x 9.5 cm fluid collection displacing bladder likely contributing to OAB symptoms and urinary retention. Hgb trended down at 11.6 from 13.2, possibly due to hemodilution, will repeat this afternoon. Reviewed images with IR, does not recommend drainage at this time due to lack of liquid component. Reassess if clinical change. Continue cefazolin  and flagyl  due to size of hematoma in the retropubic space adjacent to midurethral sling. Taking PO Flagyl  500mg  BID and Bactrim  160/800 mg BID.  - Ileus vs. SBO - history of microscopic colitis. General surgery consulted , appreciate recommendations.  Continue IVF with D5 1/2NS with 10mEq/L potassium due  to lack of home dose of potassium supplementation, will discontinue when she is tolerating PO. Simethicone PRN gas pain and resume miralax  once daily home dose (pt reports concerns with BID dosing due to microscopic colitis). Will keep clears for now and plan to advance to reg diet if passing flatus today.   - Gen - pain well controlled with IV pain medications while NPO, will transition to scheduled tylenol  and opioid use PRN moderate-severe pain if GI recovery with PO challenge   - Cardio/pulm - RRR, no acute resp distress. OSA but did not require supplemental O2 while asleep  - Urinary retention - foley replaced with in ED, adequate UOP with Cr stable at 0.74 from 0.81. Continue to monitor for foley catheter drainage due to prior retention with foley in place requiring ED evaluation. Discontinued Gemtesa and trospium  due to possible anti-cholinergic side effects. Discussed void trial vs. Foley upon discharge with office void trial, pt desires to continue foley placement for comfort until reassessment outpatient.  - H/o CVA - SCDs in place and heparin prophylaxis due to history of CVA. Encouraged ambulation  - Dispo - plan for observation until GI recovery    Stacy LOISE Caper, MD 03/11/2024, 7:36 AM

## 2024-03-11 NOTE — Progress Notes (Signed)
   Subjective/Chief Complaint: Patient with flatus.  No bowel movement.  No nausea or vomiting.   Objective: Vital signs in last 24 hours: Temp:  [98.1 F (36.7 C)-99.3 F (37.4 C)] 98.1 F (36.7 C) (12/01 0522) Pulse Rate:  [69-85] 69 (12/01 0522) Resp:  [16-17] 16 (12/01 0522) BP: (108-117)/(62-71) 108/62 (12/01 0522) SpO2:  [96 %-98 %] 98 % (12/01 0522) Last BM Date : 03/07/24  Intake/Output from previous day: 11/30 0701 - 12/01 0700 In: 720 [P.O.:720] Out: 2700 [Urine:2700] Intake/Output this shift: Total I/O In: 600 [P.O.:600] Out: -   Abdomen soft nontender mild distention  Lab Results:  Recent Labs    03/09/24 0519 03/10/24 0108  WBC 11.0* 7.0  HGB 13.2 11.6*  HCT 41.3 36.6  PLT 314 275   BMET Recent Labs    03/09/24 0519 03/10/24 0108  NA 137 139  K 2.9* 3.3*  CL 99 104  CO2 23 25  GLUCOSE 112* 92  BUN 13 7*  CREATININE 0.81 0.74  CALCIUM  9.4 8.8*   PT/INR No results for input(s): LABPROT, INR in the last 72 hours. ABG No results for input(s): PHART, HCO3 in the last 72 hours.  Invalid input(s): PCO2, PO2  Studies/Results: No results found.  Anti-infectives: Anti-infectives (From admission, onward)    Start     Dose/Rate Route Frequency Ordered Stop   03/09/24 0830  metroNIDAZOLE  (FLAGYL ) IVPB 500 mg        500 mg 100 mL/hr over 60 Minutes Intravenous  Once 03/09/24 0826 03/09/24 1041   03/09/24 0830  ceFAZolin  (ANCEF ) IVPB 2g/100 mL premix        2 g 200 mL/hr over 30 Minutes Intravenous  Once 03/09/24 0829 03/09/24 0936       Assessment/Plan: Likely mild ileus following urogyn surgery with pelvic hematoma -adv to full liquids and add miralax  - Ileus pattern   -defer further care to primary service regarding recent surgery -mobilize    FEN -full, replacing K per primary VTE - heparin ID - ancef /flagyl  completed per primary   I reviewed [facility] notes, last 24 h vitals and pain scores, last 48 h  intake and output, last 24 h labs and trends, and last 24 h imaging results.   LOS: 0 days    Debby DELENA Shipper MD 03/11/2024 Low complexity

## 2024-03-12 DIAGNOSIS — K9189 Other postprocedural complications and disorders of digestive system: Secondary | ICD-10-CM | POA: Diagnosis not present

## 2024-03-12 DIAGNOSIS — N9489 Other specified conditions associated with female genital organs and menstrual cycle: Secondary | ICD-10-CM | POA: Diagnosis not present

## 2024-03-12 LAB — BASIC METABOLIC PANEL WITH GFR
Anion gap: 4 — ABNORMAL LOW (ref 5–15)
BUN: 6 mg/dL — ABNORMAL LOW (ref 8–23)
CO2: 31 mmol/L (ref 22–32)
Calcium: 9.1 mg/dL (ref 8.9–10.3)
Chloride: 101 mmol/L (ref 98–111)
Creatinine, Ser: 0.81 mg/dL (ref 0.44–1.00)
GFR, Estimated: >60 mL/min (ref >60)
Glucose, Bld: 88 mg/dL (ref 70–99)
Potassium: 4.7 mmol/L (ref 3.5–5.1)
Sodium: 136 mmol/L (ref 135–145)

## 2024-03-12 MED ORDER — BISACODYL 10 MG RE SUPP
10.0000 mg | Freq: Once | RECTAL | Status: AC
  Administered 2024-03-12: 10 mg via RECTAL
  Filled 2024-03-12: qty 1

## 2024-03-12 NOTE — Progress Notes (Signed)
   Subjective/Chief Complaint: Patient with flatus.  No bowel movement.  Mild nausea yesterday, but has resolved.  Ate her first FLD meal just now.     Objective: Vital signs in last 24 hours: Temp:  [97.6 F (36.4 C)-98.4 F (36.9 C)] 97.6 F (36.4 C) (12/02 0546) Pulse Rate:  [63-80] 63 (12/02 0546) Resp:  [16-18] 16 (12/02 0546) BP: (122-130)/(69-92) 129/69 (12/02 0546) SpO2:  [97 %-99 %] 97 % (12/02 0546) Last BM Date : 03/07/24  Intake/Output from previous day: 12/01 0701 - 12/02 0700 In: 1320 [P.O.:1320] Out: 2400 [Urine:2400] Intake/Output this shift: No intake/output data recorded.  Abdomen soft nontender ND appearing today sitting up on EOB to eat breakfast  Lab Results:  Recent Labs    03/10/24 0108  WBC 7.0  HGB 11.6*  HCT 36.6  PLT 275   BMET Recent Labs    03/10/24 0108  NA 139  K 3.3*  CL 104  CO2 25  GLUCOSE 92  BUN 7*  CREATININE 0.74  CALCIUM  8.8*   PT/INR No results for input(s): LABPROT, INR in the last 72 hours. ABG No results for input(s): PHART, HCO3 in the last 72 hours.  Invalid input(s): PCO2, PO2  Studies/Results: No results found.  Anti-infectives: Anti-infectives (From admission, onward)    Start     Dose/Rate Route Frequency Ordered Stop   03/09/24 0830  metroNIDAZOLE  (FLAGYL ) IVPB 500 mg        500 mg 100 mL/hr over 60 Minutes Intravenous  Once 03/09/24 0826 03/09/24 1041   03/09/24 0830  ceFAZolin  (ANCEF ) IVPB 2g/100 mL premix        2 g 200 mL/hr over 30 Minutes Intravenous  Once 03/09/24 0829 03/09/24 0936       Assessment/Plan: Likely mild ileus following urogyn surgery with pelvic hematoma -cont full liquids since she just had her first meal.  Miralax  in place.  Will give her a suppository today to help jump start things from below.  She is agreeable to that. -cont to mobilize and be out of bed which she is doing well. - hopefully can adv to soft diet tomorrow is does well today with the  above -defer further care to primary service regarding recent surgery    FEN -full, miralax , suppository VTE - heparin  ID - ancef /flagyl  completed per primary   I reviewed [facility] notes, last 24 h vitals and pain scores, last 48 h intake and output, last 24 h labs and trends, and last 24 h imaging results.   LOS: 0 days    Burnard FORBES Banter MD 03/12/2024 Low complexity

## 2024-03-12 NOTE — Plan of Care (Signed)
  Problem: Education: Goal: Knowledge of the prescribed therapeutic regimen will improve Outcome: Progressing Goal: Understanding of sexual limitations or changes related to disease process or condition will improve Outcome: Progressing Goal: Individualized Educational Video(s) Outcome: Progressing   Problem: Self-Concept: Goal: Communication of feelings regarding changes in body function or appearance will improve Outcome: Progressing   Problem: Skin Integrity: Goal: Demonstration of wound healing without infection will improve Outcome: Progressing   Problem: Education: Goal: Knowledge of General Education information will improve Description: Including pain rating scale, medication(s)/side effects and non-pharmacologic comfort measures Outcome: Progressing   Problem: Health Behavior/Discharge Planning: Goal: Ability to manage health-related needs will improve Outcome: Progressing

## 2024-03-12 NOTE — Care Management Obs Status (Signed)
 MEDICARE OBSERVATION STATUS NOTIFICATION   Patient Details  Name: [name] MRN: [id number] Date of Birth: [birth year]   Medicare Observation Status Notification Given:  Yes    Jennie Laneta Dragon 03/12/2024, 1:10 PM

## 2024-03-12 NOTE — Progress Notes (Signed)
 Urogynecology Progress Note  S/p Anterior repair, sacrospinous hysteropexy, midurethral sling, cystoscopy, bladder biopsy on 02/12/24.  HD#4 admitted from ED due to pain, nausea and urinary retention   Subjective: Tolerating full liquid but did have some nausea yesterday afternoon, no vomiting. Overall pain is well controlled.  She has been passing flatus but no BM yet. She has been ambulating more.   Objective: Vitals:   03/11/24 2151 03/12/24 0546  BP: (!) 126/92 129/69  Pulse: 65 63  Resp: 18 16  Temp: 97.9 F (36.6 C) 97.6 F (36.4 C)  SpO2: 99% 97%     Gen: NAD CV: S1 S2 RRR Lungs: no acute resp distress Abd: soft, non-tender GU:  Foley in place with yellow urine in bag     Latest Ref Rng & Units 03/10/2024    1:08 AM 03/09/2024    5:19 AM 11/13/2023    1:32 PM  CBC  WBC 4.0 - 10.5 K/uL 7.0  11.0  9.3   Hemoglobin 12.0 - 15.0 g/dL 88.3  86.7  85.5   Hematocrit 36.0 - 46.0 % 36.6  41.3  43.1   Platelets 150 - 400 K/uL 275  314  191    Lab Results  Component Value Date   NA 139 03/10/2024   K 3.3 (L) 03/10/2024   CL 104 03/10/2024   CO2 25 03/10/2024     Assessment: The patient is a 73 y.o. year old admitted for observation due to pelvic hematoma, ileus vs. SBO, and abdominal pain. HD#4   Plan: pelvic hematoma  - CT abd/pelvis with 10.6 x 11.5 x 9.5 cm fluid collection displacing bladder likely contributing to OAB symptoms and urinary retention. IR does not recommend drainage at this time due to lack of liquid component. Reassess if clinical change.  -Taking PO Flagyl  500mg  BID and Bactrim  160/800 mg BID.  Ileus vs. SBO - history of microscopic colitis. General surgery consulted , appreciate recommendations.  - Taking Kcl 20mEQ BID. Will check electrolytes and replete as needed - Miralax  BID  - Full liquid diet. Will consider advancing if no nausea today. Awaiting BM.   Pain - PO tylenol  and oxycodone  ordered  Cardio/pulm -  OSA but did not require  supplemental O2 while asleep  Urinary retention  - foley replaced with in ED, adequate UOP with Cr stable at 0.74 from 0.81.  - Will consider foley removal as outpatient  H/o CVA - heparin prophylaxis due to history of CVA. Encouraged ambulation  Dispo - plan for observation until GI recovery    [name], MD 03/12/2024, 8:22 AM

## 2024-03-13 DIAGNOSIS — K9189 Other postprocedural complications and disorders of digestive system: Secondary | ICD-10-CM | POA: Diagnosis not present

## 2024-03-13 DIAGNOSIS — N9489 Other specified conditions associated with female genital organs and menstrual cycle: Secondary | ICD-10-CM | POA: Diagnosis not present

## 2024-03-13 LAB — BASIC METABOLIC PANEL WITH GFR
Anion gap: 11 (ref 5–15)
BUN: 8 mg/dL (ref 8–23)
CO2: 31 mmol/L (ref 22–32)
Calcium: 9.5 mg/dL (ref 8.9–10.3)
Chloride: 100 mmol/L (ref 98–111)
Creatinine, Ser: 0.83 mg/dL (ref 0.44–1.00)
GFR, Estimated: >60 mL/min (ref >60)
Glucose, Bld: 91 mg/dL (ref 70–99)
Potassium: 4.7 mmol/L (ref 3.5–5.1)
Sodium: 142 mmol/L (ref 135–145)

## 2024-03-13 NOTE — Progress Notes (Signed)
   Subjective/Chief Complaint: Had 2 BMs.  Tolerating FLD well with no nausea.     Objective: Vital signs in last 24 hours: Temp:  [97.5 F (36.4 C)-98 F (36.7 C)] 97.6 F (36.4 C) (12/03 1007) Pulse Rate:  [63-84] 72 (12/03 1007) Resp:  [14-18] 16 (12/03 1007) BP: (108-133)/(66-80) 108/76 (12/03 1007) SpO2:  [96 %-100 %] 97 % (12/03 1007) Last BM Date : 03/12/24  Intake/Output from previous day: 12/02 0701 - 12/03 0700 In: 1060 [P.O.:1060] Out: 3500 [Urine:3500] Intake/Output this shift: Total I/O In: 354 [P.O.:354] Out: 150 [Urine:150]  Abdomen soft ND  Lab Results:  No results for input(s): WBC, HGB, HCT, PLT in the last 72 hours.  BMET Recent Labs    03/12/24 1129 03/13/24 0508  NA 136 142  K 4.7 4.7  CL 101 100  CO2 31 31  GLUCOSE 88 91  BUN 6* 8  CREATININE 0.81 0.83  CALCIUM  9.1 9.5   PT/INR No results for input(s): LABPROT, INR in the last 72 hours. ABG No results for input(s): PHART, HCO3 in the last 72 hours.  Invalid input(s): PCO2, PO2  Studies/Results: No results found.  Anti-infectives: Anti-infectives (From admission, onward)    Start     Dose/Rate Route Frequency Ordered Stop   03/09/24 0830  metroNIDAZOLE  (FLAGYL ) IVPB 500 mg        500 mg 100 mL/hr over 60 Minutes Intravenous  Once 03/09/24 0826 03/09/24 1041   03/09/24 0830  ceFAZolin  (ANCEF ) IVPB 2g/100 mL premix        2 g 200 mL/hr over 30 Minutes Intravenous  Once 03/09/24 0829 03/09/24 0936       Assessment/Plan: Likely mild ileus following urogyn surgery with pelvic hematoma -tolerating FLD well with 2 BMs.  Agree with diet advancement to soft - no further surgical needs at this time.  We will sign off.    FEN - soft, miralax  VTE - heparin ID - ancef /flagyl  completed per primary   I reviewed [facility] notes, last 24 h vitals and pain scores, last 48 h intake and output, last 24 h labs and trends, and last 24 h imaging results.    LOS: 0 days    Burnard FORBES Banter MD 03/13/2024 Low complexity

## 2024-03-13 NOTE — Progress Notes (Signed)
 [facility]  Subjective Chief Complaint: urinary retention  History of Present Illness: [name] is a 73 y.o. female HD#4 admitted from ED due to pain, nausea and urinary retention on POD#26.  Reports feeling well Tolerating liquid diet and denies N/V. Reports passing flatus and bowel movements x 2 yesterday. History of microscopic colitis Minimal pain, sitting comfortable on bed Foley in place with clear urine Denies vaginal bleeding or discharge.   From prior note: S/p Anterior repair, sacrospinous hysteropexy, midurethral sling, cystoscopy, bladder biopsy by Dr. [name] on 02/12/24 Postoperative course complicated by urinary retention evaluated in the office on POD#1 with foley replaced after failing outpatient void trial.  ED evaluation 02/15/24 due to urinary retention when her foley catheter stopped draining. Foley catheter was replaced with resolution of suprapubic pressure.  Office void trial on POD#7 backfilled for and voided with PVR . Foley catheter was replaced.  Office void trial on POD#9 backfilled for and voided 60mL with PVR . Foley catheter was replaced.  Evaluated in the office POD#17, backfilled for with leakage noted. Patient was started on Gemtesa and treated for presumed UTI with Bactrim . UA + leuk/protein/heme. Urine culture with multiple species.   Soaks 3-4 pads with 1 diaper since foley removed, uses 4-5 pads/night and denies urge or ability to void. Added Trospium  03/07/24, followed by pain and emesis Thursday night.  Urinary leakage started to reduce and stopped 03/08/24 with inability to void. Dry mouth for the past few days Reports chills, also reports chills with colitis flares last 9 months-1 year ago with similar symptoms Minimal vaginally bleeding and denies vaginal discharge. Burning with urination for around 1 week with blood tinged urine that resolved after Bactrim . Reports left sided back pain  with baseline back pain. 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 2009 hyperplastic.    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 [facility]);   treated w/ IV iron ,  last x4  01/ 2025   OA (osteoarthritis)    back, fingers with joint pain and swelling.  chronic back pain   OAB (overactive bladder)    OSA on CPAP 08/2021   wears CPAP nightly;   followed by dr buck;   HST in epic 08-23-2021   moderate osa   Osteopenia 01/2014   T score -1.1 FRAX 14%/0.5%. Stable from prior DEXA   PONV (postoperative nausea and vomiting)    Presence of pessary  Seasonal allergies    SUI (stress urinary incontinence, female)    Uterovaginal prolapse, incomplete      Past Surgical History:  Procedure Laterality Date   BELPHAROPTOSIS REPAIR Bilateral 12/14/2017   BLADDER SUSPENSION N/A 02/12/2024   Procedure: CREATION, URETHRAL SLING, RETROPUBIC APPROACH, USING POLYPROPYLENE TAPE;  Surgeon: [name] Rosaline SAILOR, MD;  Location: [facility] OR;  Service: Gynecology;   Laterality: N/A;   BUBBLE STUDY  02/16/2021   Procedure: BUBBLE STUDY;  Surgeon: Alveta Aleene PARAS, MD;  Location: [facility] ENDOSCOPY;  Service: Cardiovascular;;   CARPAL TUNNEL RELEASE Right 08/26/2019   @[facility]    by Dr Addie ORIN MEDIATE RELEASE Left 06/24/2019   @[facility]   by Dr Addie   CATARACT EXTRACTION W/ INTRAOCULAR LENS IMPLANT Bilateral 10/2017   COLONOSCOPY WITH ESOPHAGOGASTRODUODENOSCOPY (EGD)  02/10/2020   dr  stark   CYSTOCELE REPAIR N/A 02/12/2024   Procedure: COLPORRHAPHY, ANTERIOR, FOR CYSTOCELE REPAIR;  Surgeon: [name] Rosaline SAILOR, MD;  Location: [facility] OR;  Service: Gynecology;  Laterality: N/A;  Total time 1.5 hours   CYSTOSCOPY N/A 02/12/2024   Procedure: CYSTOSCOPY, Bladder Biopsy and fulgeration;  Surgeon: [name] Rosaline SAILOR, MD;  Location: [facility] OR;  Service: Gynecology;  Laterality: N/A;   GYNECOLOGIC CRYOSURGERY     yrs ago   HYSTEROSCOPY WITH D & C  12/07/2010   @ [facility] by Dr Rockney;   with resection of endometrial polyp   KNEE ARTHROSCOPY Left 2008   LUMBAR SPINE SURGERY     x 2    last one 2000   MASS EXCISION Right 08/15/2013   Procedure: RIGHT INDEX EXCISION MASS ;  Surgeon: Franky JONELLE Curia, MD;  Location: [facility];  Service: Orthopedics;  Laterality: Right;   NASAL SEPTUM SURGERY  1972   approx   OOPHORECTOMY Right 2001   REVERSE SHOULDER ARTHROPLASTY Left 12/11/2018   Procedure: LEFT REVERSE SHOULDER ARTHROPLASTY;  Surgeon: Addie Cordella Hamilton, MD;  Location: [facility] OR;  Service: Orthopedics;  Laterality: Left;   SHOULDER ARTHROSCOPY WITH OPEN ROTATOR CUFF REPAIR AND DISTAL CLAVICLE ACROMINECTOMY Right 11/26/2013   Procedure: RIGHT SHOULDER ARTHROSCOPY WITH MINI OPEN ROTATOR CUFF REPAIR AND DISTAL CLAVICLE RESECTION, SUBACROMIAL DECOMPRESSION, POSSIBLE [facility] PATCH.;  Surgeon: Maude LELON Right, MD;  Location: [facility] OR;  Service: Orthopedics;  Laterality: Right;   TEE WITHOUT CARDIOVERSION N/A 02/16/2021   Procedure: TRANSESOPHAGEAL ECHOCARDIOGRAM (TEE);   Surgeon: Alveta Aleene PARAS, MD;  Location: [facility] ENDOSCOPY;  Service: Cardiovascular;  Laterality: N/A;   TOTAL KNEE ARTHROPLASTY Right 03/21/2017   Procedure: RIGHT TOTAL KNEE ARTHROPLASTY;  Surgeon: Right Maude LELON, MD;  Location: [facility] OR;  Service: Orthopedics;  Laterality: Right;   TOTAL KNEE ARTHROPLASTY Left 11/29/2022   Procedure: LEFT TOTAL KNEE ARTHROPLASTY;  Surgeon: Addie Cordella Hamilton, MD;  Location: [facility] ORS;  Service: Orthopedics;  Laterality: Left;   TUBAL LIGATION Bilateral    yrs ago   VAGINAL PROLAPSE REPAIR N/A 02/12/2024   Procedure: SACROSPINOUS HYSTEROPEXY;  Surgeon: [name] Rosaline SAILOR, MD;  Location: [facility] OR;  Service: Gynecology;  Laterality: N/A;  sacrospinuous hysteropexy (sacrospinuous ligament fixation)    is allergic to egg protein-containing drug products, etodolac, fish protein-containing drug products, omeprazole, pineapple, and gluten meal.   Family History  Problem Relation Age of Onset   Hypertension Mother    Heart disease Mother    Diabetes Father    Hypertension Father    Heart disease Father    Stroke Father    Diabetes Maternal Aunt    Diabetes Paternal Grandmother  Hypertension Paternal Grandfather    Stroke Paternal Grandfather     Social History   Tobacco Use   Smoking status: Never    Passive exposure: Yes   Smokeless tobacco: Never  Vaping Use   Vaping status: Never Used  Substance Use Topics   Alcohol  use: Not Currently   Drug use: No     Review of Systems was negative for a full 10 system review except as noted in the History of Present Illness.   Current Facility-Administered Medications:    acetaminophen  (TYLENOL ) tablet 650 mg, 650 mg, Oral, Q6H, Guadlupe Dull T, MD, 650 mg at 03/13/24 0600   Chlorhexidine  Gluconate Cloth 2 % PADS 6 each, 6 each, Topical, Daily, Guadlupe Dull T, MD, 6 each at 03/12/24 0954   heparin  injection 5,000 Units, 5,000 Units, Subcutaneous, Q8H, Jacque Byron T, MD, 5,000 Units at 03/13/24 0600   HYDROmorphone   (DILAUDID ) injection 0.2-0.6 mg, 0.2-0.6 mg, Intravenous, Q2H PRN, Guadlupe Dull T, MD   ondansetron  (ZOFRAN ) tablet 4 mg, 4 mg, Oral, Q6H PRN, 4 mg at 03/11/24 1336 **OR** ondansetron  (ZOFRAN ) injection 4 mg, 4 mg, Intravenous, Q6H PRN, Guadlupe Dull DASEN, MD   oxyCODONE  (Oxy IR/ROXICODONE ) immediate release tablet 5 mg, 5 mg, Oral, Q6H PRN, Guadlupe Dull T, MD   polyethylene glycol (MIRALAX  / GLYCOLAX ) packet 17 g, 17 g, Oral, BID, Cornett, Thomas, MD, 17 g at 03/12/24 2102   potassium chloride  (KLOR-CON ) packet 20 mEq, 20 mEq, Oral, BID, Guadlupe Dull T, MD, 20 mEq at 03/12/24 2102   promethazine  (PHENERGAN ) injection 12.5 mg, 12.5 mg, Intramuscular, Q6H PRN, Guadlupe Dull T, MD, 12.5 mg at 03/09/24 1048   simethicone  (MYLICON) chewable tablet 80 mg, 80 mg, Oral, QID PRN, Guadlupe Dull T, MD   Objective Vitals:   03/12/24 2206 03/13/24 0559  BP: 116/71 112/66  Pulse: 72 63  Resp: 18 16  Temp: 97.9 F (36.6 C) (!) 97.5 F (36.4 C)  SpO2: 97% 96%    Intake/Output Summary (Last 24 hours) at 03/13/2024 9166 Last data filed at 03/13/2024 0800 Gross per 24 hour  Intake 1060 ml  Output 3650 ml  Net -2590 ml     Gen: NAD CV: S1 S2 RRR Lungs: no acute resp distress Abd: soft, mildly tender to palpation in LLQ.  GU: No CVA tenderness, pain with palpation of L SI joint. Foley in place with yellow urine in bag     Latest Ref Rng & Units 03/10/2024    1:08 AM 03/09/2024    5:19 AM 11/13/2023    1:32 PM  CBC  WBC 4.0 - 10.5 K/uL 7.0  11.0  9.3   Hemoglobin 12.0 - 15.0 g/dL 88.3  86.7  85.5   Hematocrit 36.0 - 46.0 % 36.6  41.3  43.1   Platelets 150 - 400 K/uL 275  314  191       Latest Ref Rng & Units 03/13/2024    5:08 AM 03/12/2024   11:29 AM 03/10/2024    1:08 AM  CMP  Glucose 70 - 99 mg/dL 91  88  92   BUN 8 - 23 mg/dL 8  6  7    Creatinine 0.44 - 1.00 mg/dL 9.16  9.18  9.25   Sodium 135 - 145 mmol/L 142  136  139   Potassium 3.5 - 5.1 mmol/L 4.7  4.7  3.3   Chloride 98 - 111 mmol/L 100  101  104    CO2 22 - 32 mmol/L 31  31  25   Calcium  8.9 - 10.3 mg/dL 9.5  9.1  8.8   Total Protein 6.5 - 8.1 g/dL   5.9   Total Bilirubin 0.0 - 1.2 mg/dL   0.8   Alkaline Phos 38 - 126 U/L   40   AST 15 - 41 U/L   25   ALT 0 - 44 U/L   12    CT abd/pelvis with contrast 03/09/24 EXAM: CT ABDOMEN AND PELVIS WITH CONTRAST 03/09/2024 06:28:31 AM   TECHNIQUE: CT of the abdomen and pelvis was performed with the administration of 75 mL of iohexol  (OMNIPAQUE ) 350 MG/ML injection. Multiplanar reformatted images are provided for review. Automated exposure control, iterative reconstruction, and/or weight-based adjustment of the mA/kV was utilized to reduce the radiation dose to as low as reasonably achievable.   COMPARISON: CT with contrast 01/01/2020.   CLINICAL HISTORY: Abdominal pain, acute, nonlocalized. Patient had bladder surgery on November 3 and is leaking urine since the Foley was removed.   FINDINGS:   LOWER CHEST: The cardiac size is normal. There is a small hiatal hernia. Linear scarring or atelectasis in the lung bases without infiltrates.   LIVER: The liver is unremarkable.   GALLBLADDER AND BILE DUCTS: Gallbladder is unremarkable. No biliary ductal dilatation.   SPLEEN: No acute abnormality.   PANCREAS: No acute abnormality.   ADRENAL GLANDS: There is no adrenal mass.   KIDNEYS, URETERS AND BLADDER: There is a 1.8 cm Bosniak type 1 cyst in the superior pole of the left kidney, Hounsfield density is 13.2. No follow-up imaging is recommended.   There is no renal mass enhancement. No urinary stone or obstruction. The bladder is contracted around a Foley balloon and displaced into the right pelvis due to a heterogeneous thin-walled anterior left pelvic collection with a Hounsfield density of 39-42. This is most likely a hematoma and measures 10.6 x 11.5 x 9.5 cm. There is no air in the collection. There are no overt adjacent inflammatory changes such as would be  expected with an abscess, although infection is not strictly excluded.   No perinephric or periureteral stranding.   GI AND BOWEL: There is fluid filling of the stomach with normal wall thickness. Fluid-filled, mildly dilated small bowel loops in the abdomen interspersed with decompressed segments, maximum small bowel caliber 3.1 cm.   Findings could be due to a low-grade small bowel obstruction or ileus. The appendix is normal in caliber. There is fluid in the ascending and transverse colon. No evidence of colitis or diverticulitis.   PERITONEUM AND RETROPERITONEUM: There is minimal pelvic ascites posteriorly. No free air. No free hemorrhage or other localizing process.   VASCULATURE: Aorta is normal in caliber. Heavy aortic calcific plaque without aneurysm, with branch vessel atherosclerosis.   LYMPH NODES: No lymphadenopathy.   REPRODUCTIVE ORGANS: The uterus and ovaries are not enlarged.   BONES AND SOFT TISSUES: There is osteopenia and advanced degenerative change of the lumbar spine, mild dextroscoliosis and a grade 1 L2-L3 anterolisthesis, likely degenerative. Ankylosis of the right sacroiliac joint. Bilateral hip degenerative joint disease. No focal soft tissue abnormality.   IMPRESSION: 1. Large anterior left pelvic collection, likely hematoma, measuring 10.6 x 11.5 x 9.5 cm, displacing the bladder into the right pelvis; no gas or overt adjacent inflammatory change identified, however infection is not excluded. 2.   Findings may reflect a low-grade small bowel obstruction versus ileus. A single transition point could not be located. Osteopenia and degenerative change. . . .  Electronically signed by: Francis Quam MD 03/09/2024 07:07 AM EST RP Workstation: [id number]   Assessment/ Plan  Assessment: The patient is a 73 y.o. year old admitted for observation due to pelvic hematoma, ileus vs. SBO, and abdominal pain.  Plan: - pelvic hematoma - CT  abd/pelvis with 10.6 x 11.5 x 9.5 cm fluid collection displacing bladder likely contributing to OAB symptoms and urinary retention. Hgb trended down at 11.6 on 03/10/24 from 13.2, possibly due to hemodilution, will repeat this afternoon. Reviewed images with IR, did not recommend drainage at this time due to lack of liquid component. Reassess if clinical change. Completed IV cefazolin  and flagyl  due to size of hematoma in the retropubic space adjacent to midurethral sling.  - Ileus vs. SBO - history of microscopic colitis. Tolerating some PO diet prior to admission with last BM 3 days ago. General surgery following and advanced to liquid diet with BM x 2. Denies nausea this morning, continue IV zofran  PRN. S/p phenergan  upon admission. Discontinue IVF with D5 1/2NS with 10mEq/L potassium and resumed PO KCl. Encouraged ambulation. Simethicone  PRN gas pain and continue miralax  BID. Clinically stable for advancement to regular diet - Gen - reports minimal pain. Continue tylenol  and opioid use PRN moderate-severe pain   - Cardio/pulm - RRR, no acute resp distress. OSA but did not require supplemental O2 while asleep - Urinary retention - foley replaced with adequate UOP with Cr stable at 0.83 today. Continue to monitor for foley catheter drainage due to prior retention with foley in place requiring ED evaluation. Discontinued Gemtesa and trospium  due to possible anti-cholinergic side effects. Discussed void trial vs. Foley upon discharge with office void trial, pt desires to continue foley placement for comfort until reassessment outpatient. - H/o CVA - SCDs in place and heparin  prophylaxis due to history of CVA. Encouraged ambulation - Dispo - Advance to regular diet. Appreciate general surgery input.   Case reviewed with Dr. [name].   Lianne ONEIDA Gillis, MD

## 2024-03-13 NOTE — Discharge Summary (Signed)
 Physician Discharge Summary  Patient ID: [name] MRN: [id number] DOB/AGE: 12/19/1950 73 y.o.  Admit date: 03/09/2024 Discharge date: 03/14/2024  Admission Diagnoses:  Discharge Diagnoses:  Principal Problem:   Pelvic hematoma in female Active Problems:   Ileus (HCC)   Hypokalemia   Discharged Condition: good  Hospital Course: 73 y.o. female who presents to the ED due to pain, nausea and urinary retention on POD#26. She had Anterior repair, sacrospinous hysteropexy, midurethral sling, cystoscopy, bladder biopsy on 02/12/24. She was diagnosed with left anterior pelvic hematoma and ileus on CT. A foley catheter was placed. She was made NPO and diet advanced as nausea improved. General surgery was consulted due to the ileus. She had a small bowel movement on HD #4 and several on HD#5. She was able to tolerate a soft diet without nausea. A course of PO flagyl  and bactrim  was started due to possible infected hematoma. On HD#5, pain was well controlled, and she was tolerating her diet so she was deemed stable for discharge. Foley catheter was left in place on discharge for removal in the office at a later date.   Consults: general surgery  Significant Diagnostic Studies: labs:     Latest Ref Rng & Units 03/10/2024    1:08 AM 03/09/2024    5:19 AM 11/13/2023    1:32 PM  CBC  WBC 4.0 - 10.5 K/uL 7.0  11.0  9.3   Hemoglobin 12.0 - 15.0 g/dL 88.3  86.7  85.5   Hematocrit 36.0 - 46.0 % 36.6  41.3  43.1   Platelets 150 - 400 K/uL 275  314  191    Lab Results  Component Value Date   NA 142 03/13/2024   K 4.7 03/13/2024   CL 100 03/13/2024   CO2 31 03/13/2024     Treatments: IV hydration and antibiotics: metronidazole  and bactrim   Discharge Exam: Blood pressure 108/76, pulse 72, temperature 97.6 F (36.4 C), temperature source Oral, resp. rate 16, height 5' 2 (1.575 m), weight 77.1 kg, last menstrual period 02/09/2005, SpO2 97%. General appearance: alert and cooperative Resp:  clear to auscultation bilaterally Cardio: regular rate and rhythm, S1, S2 normal, no murmur, click, rub or gallop GI: soft, non-tender; bowel sounds normal; no masses,  no organomegaly  Disposition: Discharge disposition: 01-Home or Self Care       Discharge Instructions     Call MD for:  persistant nausea and vomiting   Complete by: As directed    Call MD for:  redness, tenderness, or signs of infection (pain, swelling, redness, odor or green/yellow discharge around incision site)   Complete by: As directed    Call MD for:  severe uncontrolled pain   Complete by: As directed    Call MD for:  temperature >100.4   Complete by: As directed    Diet general   Complete by: As directed    Increase activity slowly   Complete by: As directed    May walk up steps   Complete by: As directed       Allergies as of 03/13/2024       Reactions   Egg Protein-containing Drug Products Other (See Comments)   GI UPSET, JOINT PAIN, DIFF. SWALLOWING   Etodolac Other (See Comments)   ABD. CRAMPING   Fish Protein-containing Drug Products Other (See Comments)   GI UPSET, JOINT PAIN, DIFF. SWALLOWING    Omeprazole Other (See Comments)   ABD. CRAMPING   Pineapple Other (See Comments)   GI UPSET,  JOINT PAIN, DIFF. SWALLOWING   Gluten Meal Diarrhea   GI upset        Medication List     TAKE these medications    acetaminophen  500 MG tablet Commonly known as: TYLENOL  Take 1 tablet (500 mg total) by mouth every 6 (six) hours as needed (pain).   ALPHA LIPOIC ACID PO Take 1,400 mg by mouth daily.   amoxicillin  500 MG tablet Commonly known as: AMOXIL  4 tabs 1 hour prior to the procedure What changed: additional instructions   aspirin  81 MG chewable tablet Chew 1 tablet (81 mg total) by mouth 2 (two) times daily. What changed: when to take this   atenolol -chlorthalidone  50-25 MG tablet Commonly known as: TENORETIC  TAKE 1 TABLET DAILY   b complex vitamins tablet Take 1 tablet by  mouth at bedtime.   BORON PO Take 1 Dose by mouth daily.   calcium  carbonate 1500 (600 Ca) MG Tabs tablet Commonly known as: OSCAL Take 600 mg of elemental calcium  by mouth daily.   CoQ10 100 MG Caps Take 100 mg by mouth every evening.   D 5000 125 MCG (5000 UT) capsule Generic drug: Cholecalciferol  Take 5,000 Units by mouth daily.   diphenoxylate -atropine  2.5-0.025 MG tablet Commonly known as: LOMOTIL  TAKE 1 TABLET BY MOUTH FOUR TIMES A DAY AS NEEDED FOR DIARRHEA OR LOOSE STOOLS   ELDERBERRY PO Take 250 mg by mouth daily as needed (when sick).   estradiol  0.1 MG/GM vaginal cream Commonly known as: ESTRACE  Place 0.5g nightly for two weeks then twice a week after   folic acid  800 MCG tablet Commonly known as: FOLVITE  Take 800 mcg by mouth every evening.   Glutathione 500 MG Caps Take 500 mg by mouth daily.   ketotifen  0.025 % ophthalmic solution Commonly known as: ZADITOR  Place 2 drops into both eyes 2 (two) times daily as needed (allergies).   Klor-Con  M20 20 MEQ tablet Generic drug: potassium chloride  SA TAKE 1 TABLET TWICE A DAY   loperamide 2 MG tablet Commonly known as: IMODIUM A-D Take 2-4 mg by mouth 4 (four) times daily as needed for diarrhea or loose stools.   MAGNESIUM  PO Take 1 Dose by mouth daily. Liquid magnesium    melatonin 3 MG Tabs tablet Take 3 mg by mouth at bedtime.   METAMUCIL FIBER PO Take 2 tablets by mouth daily as needed (constipation).   methocarbamol  500 MG tablet Commonly known as: ROBAXIN  TAKE 1 TABLET BY MOUTH EVERY 8 HOURS AS NEEDED FOR MUSCLE SPASMS   OVER THE COUNTER MEDICATION Take 1 Dose by mouth at bedtime. Lemon Balm   OVER THE COUNTER MEDICATION Take 1 Dose by mouth at bedtime. Cats Claw liquid daily   OVER THE COUNTER MEDICATION Apply 1 Application topically at bedtime as needed (muscle pain). Magnesium  lotion   saccharomyces boulardii 250 MG capsule Commonly known as: FLORASTOR Take 250 mg by mouth daily.    simvastatin  20 MG tablet Commonly known as: ZOCOR  TAKE 1 TABLET DAILY What changed:  when to take this additional instructions   Trospium  Chloride 60 MG Cp24 Take 1 capsule (60 mg total) by mouth daily.   TURMERIC CURCUMIN PO Take 630 mg by mouth daily.   vitamin C  1000 MG tablet Take 1,000 mg by mouth daily. With zinc   VITAMIN K PO Take 1 Dose by mouth daily.   Voltaren  1 % Gel Generic drug: diclofenac  Sodium Apply 1 Application topically 2 (two) times daily as needed (pain).   ZINC PO Take  1 Dose by mouth daily. 1 dose = 1 dropper full of liquid zinc         Signed: [name] 03/14/2024, 3:29 PM

## 2024-03-13 NOTE — Plan of Care (Signed)
  Problem: Self-Concept: Goal: Communication of feelings regarding changes in body function or appearance will improve Outcome: Progressing   Problem: Education: Goal: Knowledge of General Education information will improve Description: Including pain rating scale, medication(s)/side effects and non-pharmacologic comfort measures Outcome: Progressing   Problem: Health Behavior/Discharge Planning: Goal: Ability to manage health-related needs will improve Outcome: Progressing   Problem: Activity: Goal: Risk for activity intolerance will decrease Outcome: Progressing   Problem: Nutrition: Goal: Adequate nutrition will be maintained Outcome: Progressing   Problem: Coping: Goal: Level of anxiety will decrease Outcome: Progressing   Problem: Elimination: Goal: Will not experience complications related to bowel motility Outcome: Progressing   Problem: Pain Managment: Goal: General experience of comfort will improve and/or be controlled Outcome: Progressing

## 2024-03-13 NOTE — Progress Notes (Signed)
 Patient has had two BMs this afternoon. One small and one medium, formed.

## 2024-03-13 NOTE — Discharge Instructions (Signed)
 Follow up in office for voiding trial- we will call to arrange.

## 2024-03-14 ENCOUNTER — Inpatient Hospital Stay

## 2024-03-14 ENCOUNTER — Encounter: Payer: Self-pay | Admitting: Obstetrics and Gynecology

## 2024-03-18 ENCOUNTER — Encounter: Payer: Self-pay | Admitting: Obstetrics and Gynecology

## 2024-03-18 ENCOUNTER — Inpatient Hospital Stay: Attending: Oncology

## 2024-03-18 ENCOUNTER — Telehealth: Payer: Self-pay | Admitting: Obstetrics and Gynecology

## 2024-03-18 DIAGNOSIS — D508 Other iron deficiency anemias: Secondary | ICD-10-CM

## 2024-03-18 DIAGNOSIS — D509 Iron deficiency anemia, unspecified: Secondary | ICD-10-CM | POA: Diagnosis present

## 2024-03-18 LAB — CBC (CANCER CENTER ONLY)
HCT: 40.8 % (ref 36.0–46.0)
Hemoglobin: 13 g/dL (ref 12.0–15.0)
MCH: 29.5 pg (ref 26.0–34.0)
MCHC: 31.9 g/dL (ref 30.0–36.0)
MCV: 92.5 fL (ref 80.0–100.0)
Platelet Count: 193 K/uL (ref 150–400)
RBC: 4.41 MIL/uL (ref 3.87–5.11)
RDW: 14.3 % (ref 11.5–15.5)
WBC Count: 8.1 K/uL (ref 4.0–10.5)
nRBC: 0 % (ref 0.0–0.2)

## 2024-03-18 LAB — IRON AND TIBC
Iron: 52 ug/dL (ref 28–170)
Saturation Ratios: 17 % (ref 10.4–31.8)
TIBC: 316 ug/dL (ref 250–450)
UIBC: 264 ug/dL

## 2024-03-18 LAB — FERRITIN: Ferritin: 290 ng/mL (ref 11–307)

## 2024-03-18 NOTE — Telephone Encounter (Signed)
 Spoke with patient. Last night called on call physician for blood noted in foley bag. She reports that she had about red urine output overnight. She is currently seeing drainage of clearer looking urine from the foley. She seems a tiny clot in the tubing but has not seen anything larger. She is having a little discomfort around the urethra from the foley but not around her bladder.   We discussed that due to the hematoma, she may see some blood in the urine. As long as the foley is emptying and she is not seeing any large clots, then ok to monitor. If she develops these or fever, chills, she should notify us  again. Otherwise she has an appt on Friday for a post op check and voiding trial. Encouraged hydrating with water .   [name], MD

## 2024-03-18 NOTE — Telephone Encounter (Signed)
 Patient paged on-call provider on 03/18/2024 reporting bloody urine in Foley catheter Patient of Dr. [name]  S/p Anterior repair, sacrospinous hysteropexy, midurethral sling, cystoscopy, bladder biopsy on 02/12/2024 Complicated by left anterior pelvic hematoma, ileus, and urinary retention requiring hospitalization from 03/09/2024 to 03/14/2024.   A foley catheter was placed during hospitalization with plans for removal at 03/22/2024 appointment.  She reports bloody urine started overnight after 11 PM when she emptied her Foley bag.  She notes 100 cc of dark red urine in her bag since then. No fever, nausea, vomiting, abdominal pain. She is passing flatus and has had a bowel movement.  Encouraged increased hydration Will likely need short-term outpatient follow-up with Dr. [name] this week We will CC on this message.

## 2024-03-22 ENCOUNTER — Other Ambulatory Visit (HOSPITAL_COMMUNITY)
Admission: RE | Admit: 2024-03-22 | Discharge: 2024-03-22 | Disposition: A | Source: Other Acute Inpatient Hospital | Attending: Obstetrics and Gynecology | Admitting: Obstetrics and Gynecology

## 2024-03-22 ENCOUNTER — Encounter: Payer: Self-pay | Admitting: Obstetrics and Gynecology

## 2024-03-22 ENCOUNTER — Ambulatory Visit: Admitting: Obstetrics and Gynecology

## 2024-03-22 VITALS — BP 138/84 | HR 106

## 2024-03-22 DIAGNOSIS — R319 Hematuria, unspecified: Secondary | ICD-10-CM

## 2024-03-22 DIAGNOSIS — N390 Urinary tract infection, site not specified: Secondary | ICD-10-CM

## 2024-03-22 DIAGNOSIS — R82998 Other abnormal findings in urine: Secondary | ICD-10-CM

## 2024-03-22 DIAGNOSIS — Z9889 Other specified postprocedural states: Secondary | ICD-10-CM

## 2024-03-22 DIAGNOSIS — Z48816 Encounter for surgical aftercare following surgery on the genitourinary system: Secondary | ICD-10-CM

## 2024-03-22 LAB — URINALYSIS, COMPLETE (UACMP) WITH MICROSCOPIC
Bilirubin Urine: NEGATIVE
Glucose, UA: NEGATIVE mg/dL
Ketones, ur: NEGATIVE mg/dL
Nitrite: NEGATIVE
Protein, ur: >=300 mg/dL — AB
Specific Gravity, Urine: 1.016 (ref 1.005–1.030)
pH: 8 (ref 5.0–8.0)

## 2024-03-22 LAB — POCT URINALYSIS DIP (CLINITEK)
Bilirubin, UA: NEGATIVE
Glucose, UA: NEGATIVE mg/dL
Nitrite, UA: POSITIVE — AB
POC PROTEIN,UA: >=300 — AB
Spec Grav, UA: 1.015 (ref 1.010–1.025)
Urobilinogen, UA: 0.2 U/dL
pH, UA: >=9 — AB (ref 5.0–8.0)

## 2024-03-22 MED ORDER — SULFAMETHOXAZOLE-TRIMETHOPRIM 800-160 MG PO TABS: 1.0000 | ORAL_TABLET | Freq: Two times a day (BID) | ORAL | 0 refills | Status: AC

## 2024-03-22 NOTE — Patient Instructions (Signed)
 Start vaginal estrogen cream every other day.   We will treat you for a urinary tract infection with bactrim  twice a day for 7 days.

## 2024-03-22 NOTE — Progress Notes (Signed)
  Urogynecology  Date of Visit: 03/22/2024  History of Present Illness: [name] is a 73 y.o. female scheduled today for a post-operative visit.   Surgery: s/p  anterior repair, sacrospinous hysteropexy, midurethral sling, cystoscopy, bladder biopsy 02/12/24   Postoperative course complicated by retropubic hematoma, intermittent urinary retention, and ileus requiring hospitalization.   Has had hematuria in catheter bag last weekend which has slowly resolved over this last week. Restarted trospium  for OAB (adding Gemtesa had prompted retention). Denies any pain, fever or vaginal bleeding.   UTI in the last 6 weeks? No - negative culture on 03/01/24 Pain? No  She has returned to her normal activity (except for postop restrictions) Vaginal bulge? No  Stress incontinence: No  Urgency/frequency: n/a- has had catheter Urge incontinence: has improved with trospium  but has had catheter  Voiding dysfunction: previously yes with trospium  and gemtesa Bowel issues: No   Subjective Success: Do you usually have a bulge or something falling out that you can see or feel in the vaginal area? No  Retreatment Success: Any retreatment with surgery or pessary for any compartment? 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       No evidence of urothelial atypia or malignancy.   Medications: She has a current medication list which includes the following prescription(s): acetaminophen , alpha-lipoic acid, amoxicillin , vitamin c , aspirin , atenolol -chlorthalidone , b complex vitamins, boron, calcium  carbonate, d 5000, coq10, diclofenac  sodium, diphenoxylate -atropine , elderberry, estradiol , folic acid , glutathione, ketotifen , klor-con  m20, loperamide, magnesium , melatonin, fiber, methocarbamol , multiple vitamins-minerals, OVER THE COUNTER MEDICATION, OVER THE COUNTER MEDICATION, OVER THE COUNTER MEDICATION, saccharomyces boulardii, simvastatin ,  sulfamethoxazole -trimethoprim , trospium  chloride, turmeric, and vitamin k.   Allergies: Patient is allergic to egg protein-containing drug products, etodolac, fish protein-containing drug products, omeprazole, pineapple, and gluten meal.   Physical Exam: BP 138/84   Pulse (!) 106   LMP 02/09/2005   Abdomen: soft, non-tender, without masses or organomegaly Suprapubic incisions- well healed Pelvic Examination: Vagina: Incisions healing well. Sutures are present at incision line and there is not granulation tissue. No tenderness along the anterior or posterior vagina. No apical tenderness. No pelvic masses. No visible or palpable mesh.  POP-Q: POP-Q  -3                                            Aa   -3                                           Ba  -8                                              C   3                                            Gh  4.5  Pb  8.5                                            tvl   -2                                            Ap  -2                                            Bp  -8                                              D     Post Void Residual - 03/22/24 1235       Post Void Residual   Post Void Residual 5 mL         Results for orders placed or performed in visit on 03/22/24  POCT URINALYSIS DIP (CLINITEK)   Collection Time: 03/22/24  1:46 PM  Result Value Ref Range   Color, UA yellow yellow   Clarity, UA clear clear   Glucose, UA negative negative mg/dL   Bilirubin, UA negative negative   Ketones, POC UA trace (5) (A) negative mg/dL   Spec Grav, UA 8.984 8.989 - 1.025   Blood, UA moderate (A) negative   pH, UA >=9.0 (A) 5.0 - 8.0   POC PROTEIN,UA >=300 (A) negative, trace   Urobilinogen, UA 0.2 0.2 or 1.0 E.U./dL   Nitrite, UA Positive (A) Negative   Leukocytes, UA Large (3+) (A) Negative   *Note: Due to a large number of results and/or encounters for the requested time period,  some results have not been displayed. A complete set of results can be found in Results Review.    ---------------------------------------------------------  Assessment and Plan:  1. Post-operative state   2. Acute UTI   3. Urine leukocytes   4. Hematuria, unspecified type     - Will treat for acute UTI with bactrim  DS x7 days - Will have her return for cystoscopy and urodynamic testing next week. Intermittent voiding dysfunction likely due to retropubic hematoma shifting the bladder and due to OAB medications however have not ruled out voiding dysfunction from the sling. We discussed that urodynamic testing will confirm emptying - Restart vaginal estrogen cream every other day.  - Can resume regular activity including exercise. Discussed avoidance of heavy lifting and straining long term to reduce the risk of recurrence.   [name], MD

## 2024-03-22 NOTE — Progress Notes (Signed)
 [name] underwent Colporrhaphy, Anterior, For Cystocele Repair, Sacrospinous Hysteropexy, Creation, Urethral Sling, Retropubic Approach, Using Polypropylene Tape, and CYSTOSCOPY, Bladder Biopsy and fulgeration on 02/12/2024  She presents for a voiding trial.   Patient was identified with 2 identifiers.  The patient states she does not have any concerns with the foley placed.  200 mL of NS was instilled into the bladder via a catheter.  The catheter was removed and patient was instructed to void into the urinary hat.  She voided 150 mL.  The post void residual measured by bladder scan was less than 20 mL.  She did pass the voiding trial.  The patient was not sent home with a catheter.    The patient received aftercare instructions and will follow up as scheduled.

## 2024-03-23 ENCOUNTER — Encounter: Payer: Self-pay | Admitting: Adult Health

## 2024-03-23 LAB — URINE CULTURE

## 2024-03-25 DIAGNOSIS — M65331 Trigger finger, right middle finger: Secondary | ICD-10-CM | POA: Diagnosis not present

## 2024-03-26 ENCOUNTER — Ambulatory Visit (INDEPENDENT_AMBULATORY_CARE_PROVIDER_SITE_OTHER): Admitting: Obstetrics and Gynecology

## 2024-03-26 VITALS — BP 134/83 | HR 79

## 2024-03-26 DIAGNOSIS — Z9889 Other specified postprocedural states: Secondary | ICD-10-CM

## 2024-03-26 DIAGNOSIS — R350 Frequency of micturition: Secondary | ICD-10-CM

## 2024-03-26 DIAGNOSIS — R300 Dysuria: Secondary | ICD-10-CM

## 2024-03-26 DIAGNOSIS — R310 Gross hematuria: Secondary | ICD-10-CM | POA: Diagnosis not present

## 2024-03-26 DIAGNOSIS — N393 Stress incontinence (female) (male): Secondary | ICD-10-CM | POA: Diagnosis not present

## 2024-03-26 DIAGNOSIS — N3281 Overactive bladder: Secondary | ICD-10-CM | POA: Diagnosis not present

## 2024-03-26 DIAGNOSIS — R82998 Other abnormal findings in urine: Secondary | ICD-10-CM

## 2024-03-26 DIAGNOSIS — R948 Abnormal results of function studies of other organs and systems: Secondary | ICD-10-CM | POA: Diagnosis not present

## 2024-03-26 LAB — POCT URINALYSIS DIP (CLINITEK)
Bilirubin, UA: NEGATIVE
Blood, UA: NEGATIVE
Glucose, UA: NEGATIVE mg/dL
Ketones, POC UA: NEGATIVE mg/dL
Leukocytes, UA: NEGATIVE
Nitrite, UA: NEGATIVE
POC PROTEIN,UA: NEGATIVE
Spec Grav, UA: 1.015 (ref 1.010–1.025)
Urobilinogen, UA: 0.2 U/dL
pH, UA: 7 (ref 5.0–8.0)

## 2024-03-26 NOTE — Progress Notes (Signed)
 [facility] Urodynamics Procedure  Referring Physician: Rollene Almarie LABOR, MD Date of Procedure: 03/26/2024  [name] is a 73 y.o. female who presents for urodynamic evaluation. Indication(s) for study: Post surgical UDS   Vital Signs: BP 134/83   Pulse 79   LMP 02/09/2005   Laboratory Results: A catheterized urine specimen revealed:  POC urine:  Lab Results  Component Value Date   COLORU yellow 03/26/2024   CLARITYU clear 03/26/2024   GLUCOSEUR negative 03/26/2024   BILIRUBINUR negative 03/26/2024   SPECGRAV 1.015 03/26/2024   RBCUR negative 03/26/2024   PHUR 7.0 03/26/2024   PROTEINUR >=300 (A) 03/22/2024   UROBILINOGEN 0.2 03/26/2024   LEUKOCYTESUR Negative 03/26/2024     Voiding Diary: Deferred  Procedure Timeout:  The correct patient was verified and the correct procedure was verified. The patient was in the correct position and safety precautions were reviewed based on at the patient's history.  Urodynamic Procedure A 82F dual lumen urodynamics catheter was placed under sterile conditions into the patient's bladder. A 82F catheter was placed into the rectum in order to measure abdominal pressure. EMG patches were placed in the appropriate position.  All connections were confirmed and calibrations/adjusted made. Saline was instilled into the bladder through the dual lumen catheters.  Cough/valsalva pressures were measured periodically during filling.  Patient was allowed to void.  The bladder was then emptied of its residual.  UROFLOW: Revealed a Qmax of 8.4 mL/sec.  She voided 61 mL and had a residual of 20 mL.  It was a normal pattern and represented normal habits though interpretation limited due to low voided volume.  CMG: This was performed with sterile water  in the sitting position at a fill rate of 10-30 mL/min.    First sensation of fullness was 69 mLs,  First urge was 114 mLs,  Strong urge was 200 mLs and  Capacity was 384  mLs  Stress incontinence was demonstrated Highest positive CLPP was 62 cmH20 at 201 ml. Highest negative VLPP was 79 cmH20at 201 ml.   Detrusor function was overactive, with phasic contractions seen.  The first occurred at 30 mL to 7 cm of water  and was not associated with urge.  Compliance:  Low. End fill detrusor pressure was 29cmH20.  Calculated compliance was 46mL/cmH20  UPP: MUCP without barrier reduction was 22 cm of water .    MICTURITION STUDY: Voiding was performed without reduction in the sitting position.  Pdet at Qmax was 25 cm of water .  Qmax was 17 mL/sec.  It was a prolonged normal pattern.  She voided 344 mL and had a residual of 40 mL.  It was a volitional void, sustained detrusor contraction was present and abdominal straining was not present  EMG: This was performed with patches.  She had voluntary contractions, recruitment with fill was present and urethral sphincter was not relaxed with void.  The details of the procedure with the study tracings have been scanned into EPIC.   Urodynamic Impression: 1. Sensation was normal; capacity was normal 2. Stress Incontinence was demonstrated at normal pressures; 3. Detrusor Overactivity was demonstrated without leakage. 4. Emptying was dysfunctional with a normal PVR, a sustained detrusor contraction present,  abdominal straining not present, dyssynergic urethral sphincter activity on EMG.  Plan: - The patient will follow up  to discuss the findings and treatment options.  -Reviewed with Dr. [name] and patient was encouraged to stop Trospium  at this time.  -Of note, patient had a small amount of rectal blood  noted when catheters were removed. No active bleeding noted on exam, patient was encouraged to keep an eye on the bleeding to make sure it did not increase.

## 2024-03-26 NOTE — Progress Notes (Signed)
 CYSTOSCOPY  CC:  This is a 73 y.o. with gross hematuria who presents today for cystoscopy. She is s/p  anterior repair, sacrospinous hysteropexy, midurethral sling, cystoscopy, bladder biopsy 02/12/24. Surgery complicated by pelvic hematoma and intermittent urinary retention. She had gross hematuria last week with catheter in place. Cath urine culture showed multiple species (not diagnostic) but she was placed on bactrim  for suspicion of UTI.   Results for orders placed or performed during the hospital encounter of 03/22/24  Urine Culture   Collection Time: 03/22/24  5:41 PM   Specimen: Urine, Random  Result Value Ref Range   Specimen Description URINE, RANDOM    Special Requests      NONE Performed at [facility], [address], [place], [place] [address]    Culture MULTIPLE SPECIES PRESENT, SUGGEST RECOLLECTION (A)    Report Status 03/23/2024 FINAL   Urinalysis, Complete w Microscopic -   Collection Time: 03/22/24  5:41 PM  Result Value Ref Range   Color, Urine AMBER (A) YELLOW   APPearance CLOUDY (A) CLEAR   Specific Gravity, Urine 1.016 1.005 - 1.030   pH 8.0 5.0 - 8.0   Glucose, UA NEGATIVE NEGATIVE mg/dL   Hgb urine dipstick SMALL (A) NEGATIVE   Bilirubin Urine NEGATIVE NEGATIVE   Ketones, ur NEGATIVE NEGATIVE mg/dL   Protein, ur >=699 (A) NEGATIVE mg/dL   Nitrite NEGATIVE NEGATIVE   Leukocytes,Ua LARGE (A) NEGATIVE   RBC / HPF 6-10 0 - 5 RBC/hpf   WBC, UA 21-50 0 - 5 WBC/hpf   Bacteria, UA RARE (A) NONE SEEN   Squamous Epithelial / HPF 0-5 0 - 5 /HPF   Mucus PRESENT    Triple Phosphate Crystal PRESENT    *Note: Due to a large number of results and/or encounters for the requested time period, some results have not been displayed. A complete set of results can be found in Results Review.     BP 134/83   Pulse 79   LMP 02/09/2005   CYSTOSCOPY: A time out was performed.  The periurethral area was prepped and draped in a sterile manner.  2% lidocaine  jetpack was  inserted at the urethral meatus. The urethra and bladder were visualized with flexible cystoscope.  She had normal urethral coaptation and normal urethral mucosa.  She had normal bladder mucosa. She had bilateral clear efflux from both ureteral orifices.  She had no squamous metaplasia at the trigone, no trabeculations, cellules or diverticuli.  The scope was removed to visualize the urethra again and no mesh noted.    ASSESSMENT:  73 y.o. with gross hematuria. Cystoscopy today is normal.  PLAN:  - Suspect that hematuria was due to UTI or resolving hematoma - New urine culture sent today to pathnostics since last culture was not diagnostic, although still on bactrim .  - She is undergoing urodynamics today to assess emptying  [name], MD

## 2024-04-08 ENCOUNTER — Ambulatory Visit: Payer: Medicare Other

## 2024-04-08 VITALS — Ht 62.0 in | Wt 170.0 lb

## 2024-04-08 DIAGNOSIS — Z0000 Encounter for general adult medical examination without abnormal findings: Secondary | ICD-10-CM | POA: Diagnosis not present

## 2024-04-08 DIAGNOSIS — Z1159 Encounter for screening for other viral diseases: Secondary | ICD-10-CM | POA: Diagnosis not present

## 2024-04-08 NOTE — Progress Notes (Signed)
 "  Chief Complaint  Patient presents with   Medicare Wellness     Subjective:   [name] is a 73 y.o. female who presents for a Medicare Annual Wellness Visit.  Visit info / Clinical Intake: Medicare Wellness Visit Type:: Subsequent Annual Wellness Visit Persons participating in visit and providing information:: patient Medicare Wellness Visit Mode:: Video Since this visit was completed virtually, some vitals may be partially provided or unavailable. Missing vitals are due to the limitations of the virtual format.: Documented vitals are patient reported If Telephone or Video please confirm:: I connected with patient using audio/video enable telemedicine. I verified patient identity with two identifiers, discussed telehealth limitations, and patient agreed to proceed. Patient Location:: Home Provider Location:: Office Interpreter Needed?: No Pre-visit prep was completed: yes AWV questionnaire completed by patient prior to visit?: no Living arrangements:: lives with spouse/significant other Patient's Overall Health Status Rating: good Typical amount of pain: none Does pain affect daily life?: no Are you currently prescribed opioids?: no  Dietary Habits and Nutritional Risks How many meals a day?: 3 (2-3) Eats fruit and vegetables daily?: yes Most meals are obtained by: preparing own meals In the last 2 weeks, have you had any of the following?: none Diabetic:: no  Functional Status Activities of Daily Living (to include ambulation/medication): Independent Ambulation: Independent with device- listed below Home Assistive Devices/Equipment: Eyeglasses; Walker (specify Type); Cane; CPAP Medication Administration: Independent Home Management (perform basic housework or laundry): Independent Manage your own finances?: yes Primary transportation is: driving Concerns about vision?: no *vision screening is required for WTM* Concerns about hearing?: no  Fall Screening Falls  in the past year?: 0 Number of falls in past year: 0 Was there an injury with Fall?: 0 Fall Risk Category Calculator: 0 Patient Fall Risk Level: Low Fall Risk  Fall Risk Patient at Risk for Falls Due to: Impaired balance/gait; No Fall Risks Fall risk Follow up: Falls evaluation completed; Falls prevention discussed  Home and Transportation Safety: All rugs have non-skid backing?: N/A, no rugs All stairs or steps have railings?: yes (inside/outside) Grab bars in the bathtub or shower?: yes Have non-skid surface in bathtub or shower?: yes Good home lighting?: yes Regular seat belt use?: yes Hospital stays in the last year:: (!) yes How many hospital stays:: 5 Reason: bladder surgery  Cognitive Assessment Difficulty concentrating, remembering, or making decisions? : no Will 6CIT or Mini Cog be Completed: yes What year is it?: 0 points What month is it?: 0 points Give patient an address phrase to remember (5 components): [address] [place], [place] About what time is it?: 0 points Count backwards from 20 to 1: 0 points Say the months of the year in reverse: 0 points Repeat the address phrase from earlier: 0 points 6 CIT Score: 0 points  Advance Directives (For Healthcare) Does Patient Have a Medical Advance Directive?: Yes Does patient want to make changes to medical advance directive?: Yes (Inpatient - patient requests chaplain consult to change a medical advance directive) Type of Advance Directive: Healthcare Power of [place]; Living will Copy of Healthcare Power of Attorney in Chart?: No - copy requested Copy of Living Will in Chart?: No - copy requested Would patient like information on creating a medical advance directive?: No - Patient declined (pt has one already)  Reviewed/Updated  Reviewed/Updated: Reviewed All (Medical, Surgical, Family, Medications, Allergies, Care Teams, Patient Goals)    Allergies (verified) Egg protein-containing drug products, Etodolac,  Fish protein-containing drug products, Omeprazole, Pineapple, and  Gluten meal   Current Medications (verified) Outpatient Encounter Medications as of 04/08/2024  Medication Sig   acetaminophen  (TYLENOL ) 500 MG tablet Take 1 tablet (500 mg total) by mouth every 6 (six) hours as needed (pain).   ALPHA LIPOIC ACID PO Take 1,400 mg by mouth daily.   amoxicillin  (AMOXIL ) 500 MG tablet 4 tabs 1 hour prior to the procedure (Patient taking differently: 4 tabs 1 hour prior to dental procedures)   Ascorbic Acid  (VITAMIN C ) 1000 MG tablet Take 1,000 mg by mouth daily. With zinc   aspirin  81 MG chewable tablet Chew 1 tablet (81 mg total) by mouth 2 (two) times daily. (Patient taking differently: Chew 81 mg by mouth daily.)   atenolol -chlorthalidone  (TENORETIC ) 50-25 MG tablet TAKE 1 TABLET DAILY   b complex vitamins tablet Take 1 tablet by mouth at bedtime.   BORON PO Take 1 Dose by mouth daily.   calcium  carbonate (OSCAL) 1500 (600 Ca) MG TABS tablet Take 600 mg of elemental calcium  by mouth daily.   Cholecalciferol  (D 5000) 5000 units capsule Take 5,000 Units by mouth daily.   Coenzyme Q10 (COQ10) 100 MG CAPS Take 100 mg by mouth every evening.    diclofenac  Sodium (VOLTAREN ) 1 % GEL Apply 1 Application topically 2 (two) times daily as needed (pain).   diphenoxylate -atropine  (LOMOTIL ) 2.5-0.025 MG tablet TAKE 1 TABLET BY MOUTH FOUR TIMES A DAY AS NEEDED FOR DIARRHEA OR LOOSE STOOLS   ELDERBERRY PO Take 250 mg by mouth daily as needed (when sick).   estradiol  (ESTRACE ) 0.1 MG/GM vaginal cream Place 0.5g nightly for two weeks then twice a week after   folic acid  (FOLVITE ) 800 MCG tablet Take 800 mcg by mouth every evening.    Glutathione 500 MG CAPS Take 500 mg by mouth daily.   ketotifen  (ZADITOR ) 0.025 % ophthalmic solution Place 2 drops into both eyes 2 (two) times daily as needed (allergies).   KLOR-CON  M20 20 MEQ tablet TAKE 1 TABLET TWICE A DAY   loperamide (IMODIUM A-D) 2 MG tablet Take 2-4 mg by  mouth 4 (four) times daily as needed for diarrhea or loose stools.   MAGNESIUM  PO Take 1 Dose by mouth daily. Liquid magnesium    Melatonin 3 MG TABS Take 3 mg by mouth at bedtime.    METAMUCIL FIBER PO Take 2 tablets by mouth daily as needed (constipation).   methocarbamol  (ROBAXIN ) 500 MG tablet TAKE 1 TABLET BY MOUTH EVERY 8 HOURS AS NEEDED FOR MUSCLE SPASMS   Multiple Vitamins-Minerals (ZINC PO) Take 1 Dose by mouth daily. 1 dose = 1 dropper full of liquid zinc   OVER THE COUNTER MEDICATION Take 1 Dose by mouth at bedtime. Lemon Balm   OVER THE COUNTER MEDICATION Take 1 Dose by mouth at bedtime. Cats Claw liquid daily   OVER THE COUNTER MEDICATION Apply 1 Application topically at bedtime as needed (muscle pain). Magnesium  lotion   saccharomyces boulardii (FLORASTOR) 250 MG capsule Take 250 mg by mouth daily.   simvastatin  (ZOCOR ) 20 MG tablet TAKE 1 TABLET DAILY (Patient taking differently: Take 20 mg by mouth See admin instructions. Every 2-3 days.)   Trospium  Chloride 60 MG CP24 Take 1 capsule (60 mg total) by mouth daily.   TURMERIC CURCUMIN PO Take 630 mg by mouth daily.   VITAMIN K PO Take 1 Dose by mouth daily.   No facility-administered encounter medications on file as of 04/08/2024.    History: Past Medical History:  Diagnosis Date   Chronic back  pain    arthritis    Chronic diarrhea    loose stool   Diverticulosis    Esophageal stenosis 02/2020   EGD by Dr Aneita stated benign,  no dilatation   GERD (gastroesophageal reflux disease) 06/2004   non-specific gastritis on EGD 06/2004   Headache(784.0)    occasionally   Hiatal hernia    History of colitis 02/2020   lymphocytic colitis by colonoscopy bx ,  Dr Aneita ,  treated   History of colon polyps 2004, 2009   2004:adenomatous. 2009 hyperplastic.    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 [facility]);   treated w/ IV iron ,  last x4  01/ 2025   OA (osteoarthritis)    back, fingers with joint pain and swelling.  chronic back pain   OAB (overactive bladder)    OSA on CPAP 08/2021   wears CPAP nightly;   followed by dr buck;   HST in epic 08-23-2021   moderate osa   Osteopenia 01/2014   T score -1.1 FRAX 14%/0.5%. Stable from prior DEXA   PONV (postoperative nausea and vomiting)    Presence of pessary    Seasonal allergies    SUI (stress urinary incontinence, female)    Uterovaginal prolapse, incomplete    Past Surgical History:  Procedure Laterality Date   BELPHAROPTOSIS REPAIR Bilateral 12/14/2017   BLADDER SUSPENSION N/A 02/12/2024   Procedure: CREATION, URETHRAL SLING, RETROPUBIC APPROACH, USING POLYPROPYLENE TAPE;  Surgeon: [name] Rosaline SAILOR, MD;  Location: [facility] OR;  Service: Gynecology;  Laterality: N/A;   BUBBLE STUDY  02/16/2021   Procedure: BUBBLE STUDY;  Surgeon: Alveta Aleene PARAS, MD;  Location: [facility] ENDOSCOPY;  Service: Cardiovascular;;   CARPAL TUNNEL RELEASE Right 08/26/2019   @[facility]    by Dr Addie ORIN MEDIATE RELEASE Left 06/24/2019   @[facility]   by Dr Addie   CATARACT EXTRACTION W/ INTRAOCULAR LENS IMPLANT Bilateral 10/2017   COLONOSCOPY WITH ESOPHAGOGASTRODUODENOSCOPY (EGD)  02/10/2020   dr  stark   CYSTOCELE REPAIR N/A 02/12/2024   Procedure: COLPORRHAPHY, ANTERIOR, FOR CYSTOCELE REPAIR;  Surgeon: [name] Rosaline SAILOR, MD;  Location: [facility] OR;  Service: Gynecology;  Laterality: N/A;  Total time 1.5 hours   CYSTOSCOPY N/A 02/12/2024   Procedure: CYSTOSCOPY, Bladder Biopsy and fulgeration;  Surgeon: [name] Rosaline SAILOR, MD;  Location: [facility] OR;  Service: Gynecology;  Laterality: N/A;   GYNECOLOGIC CRYOSURGERY     yrs ago   HYSTEROSCOPY WITH D & C  12/07/2010   @ [facility] by Dr Rockney;   with resection of endometrial polyp   KNEE ARTHROSCOPY Left 2008   LUMBAR SPINE SURGERY     x 2    last one 2000   MASS EXCISION Right 08/15/2013   Procedure: RIGHT INDEX EXCISION MASS ;  Surgeon: Franky JONELLE Curia, MD;  Location: [facility];  Service: Orthopedics;  Laterality: Right;   NASAL SEPTUM SURGERY  1972   approx   OOPHORECTOMY Right 2001   REVERSE SHOULDER ARTHROPLASTY Left 12/11/2018  Procedure: LEFT REVERSE SHOULDER ARTHROPLASTY;  Surgeon: Addie Cordella Hamilton, MD;  Location: [facility] OR;  Service: Orthopedics;  Laterality: Left;   SHOULDER ARTHROSCOPY WITH OPEN ROTATOR CUFF REPAIR AND DISTAL CLAVICLE ACROMINECTOMY Right 11/26/2013   Procedure: RIGHT SHOULDER ARTHROSCOPY WITH MINI OPEN ROTATOR CUFF REPAIR AND DISTAL CLAVICLE RESECTION, SUBACROMIAL DECOMPRESSION, POSSIBLE [facility] PATCH.;  Surgeon: Maude LELON Right, MD;  Location: [facility] OR;  Service: Orthopedics;  Laterality: Right;   TEE WITHOUT CARDIOVERSION N/A 02/16/2021   Procedure: TRANSESOPHAGEAL ECHOCARDIOGRAM (TEE);  Surgeon: Alveta Aleene PARAS, MD;  Location: [facility] ENDOSCOPY;  Service: Cardiovascular;  Laterality: N/A;   TOTAL KNEE ARTHROPLASTY Right 03/21/2017   Procedure: RIGHT TOTAL KNEE ARTHROPLASTY;  Surgeon: Right Maude LELON, MD;  Location: [facility] OR;  Service: Orthopedics;  Laterality: Right;   TOTAL KNEE ARTHROPLASTY Left 11/29/2022   Procedure: LEFT TOTAL KNEE ARTHROPLASTY;  Surgeon: Addie Cordella Hamilton, MD;  Location: [facility] ORS;  Service: Orthopedics;  Laterality: Left;   TUBAL LIGATION Bilateral    yrs ago   VAGINAL PROLAPSE REPAIR N/A 02/12/2024   Procedure: SACROSPINOUS HYSTEROPEXY;  Surgeon: [name] Rosaline SAILOR, MD;  Location: [facility] OR;  Service: Gynecology;  Laterality: N/A;  sacrospinuous hysteropexy (sacrospinuous ligament fixation)    Family History  Problem Relation Age of Onset   Hypertension Mother    Heart disease Mother    Diabetes Father    Hypertension Father    Heart disease Father    Stroke Father    Diabetes Maternal Aunt    Diabetes Paternal Grandmother    Hypertension Paternal Grandfather    Stroke Paternal Grandfather    Social History   Occupational History   Occupation: Forensic Scientist - Human Resources-Retired    Comment: Disability/Retired  Tobacco Use   Smoking status: Never    Passive exposure: Yes   Smokeless tobacco: Never  Vaping Use   Vaping status: Never Used  Substance and Sexual Activity   Alcohol  use: Not Currently   Drug use: No   Sexual activity: Not Currently    Partners: Male    Birth control/protection: Surgical, Post-menopausal    Comment: BTL-1st intercourse 73 yo-More than 5 partners   Tobacco Counseling Counseling given: No  SDOH Screenings   Food Insecurity: No Food Insecurity (04/08/2024)  Housing: Unknown (04/08/2024)  Transportation Needs: No Transportation Needs (04/08/2024)  Utilities: Not At Risk (04/08/2024)  Alcohol  Screen: Low Risk (04/04/2023)  Depression (PHQ2-9): Low Risk (04/08/2024)  Financial Resource Strain: Low Risk (04/04/2023)  Physical Activity: Inactive (04/08/2024)  Social Connections: Socially Integrated (04/08/2024)  Recent Concern: Social Connections - Moderately Isolated (03/09/2024)  Stress: No Stress Concern Present (04/08/2024)  Tobacco Use: Medium Risk (04/08/2024)  Health Literacy: Adequate Health Literacy (04/08/2024)   See flowsheets for full screening details  Depression Screen PHQ 2 & 9 Depression Scale- Over the past 2 weeks, how often have you been bothered by any of the following problems? Little interest or pleasure in doing things: 0 Feeling down, depressed, or hopeless (PHQ Adolescent also includes...irritable): 3 PHQ-2 Total Score: 3 Trouble falling or staying asleep, or sleeping too much: 0 Feeling tired or  having little energy: 0 Poor appetite or overeating (PHQ Adolescent also includes...weight loss): 0 Feeling bad about yourself - or that you are a failure or have let yourself or your family down: 0 Trouble concentrating on things, such as reading the newspaper or watching television (PHQ Adolescent also includes...like school work): 0 Moving or speaking so slowly that other people could have noticed. Or the opposite - being  so fidgety or restless that you have been moving around a lot more than usual: 1 (uses a cane/walker sometimes) Thoughts that you would be better off dead, or of hurting yourself in some way: 0 PHQ-9 Total Score: 4 If you checked off any problems, how difficult have these problems made it for you to do your work, take care of things at home, or get along with other people?: Not difficult at all  Depression Treatment Depression Interventions/Treatment : EYV7-0 Score <4 Follow-up Not Indicated; Medication; Currently on Treatment     Goals Addressed               This Visit's Progress     Patient Stated (pt-stated)        Patient stated she plans to continue to stay active             Objective:    Today's Vitals   04/08/24 0958  Weight: 170 lb (77.1 kg)  Height: 5' 2 (1.575 m)   Body mass index is 31.09 kg/m.  Hearing/Vision screen Hearing Screening - Comments:: Denies hearing difficulties   Vision Screening - Comments:: Wears rx glasses - up to date with routine eye exams with an Optometrist Immunizations and Health Maintenance Health Maintenance  Topic Date Due   Hepatitis C Screening  Never done   DTaP/Tdap/Td (2 - Tdap) 06/08/2020   COVID-19 Vaccine (4 - 2025-26 season) 12/11/2023   Influenza Vaccine  07/09/2024 (Originally 11/10/2023)   Medicare Annual Wellness (AWV)  04/08/2025   Mammogram  09/10/2025   Colonoscopy  02/09/2030   Pneumococcal Vaccine: 50+ Years  Completed   Bone Density Scan  Completed   Meningococcal B Vaccine  Aged Out    Zoster Vaccines- Shingrix  Discontinued        Assessment/Plan:  This is a routine wellness examination for [place].  I have recommended that this patient have a Influenza vaccine but she declines at this time. I have discussed the risks and benefits of this procedure with her. The patient verbalizes understanding.   Patient Care Team: Rollene Almarie LABOR, MD as PCP - General (Internal Medicine) Lonni Slain, MD as PCP - Cardiology (Cardiology) [facility], [place] Melanee, Annah BROCKS, MD as Consulting Physician (Oncology) Gregg Lek, MD as Consulting Physician (Neurology)  I have personally reviewed and noted the following in the patients chart:   Medical and social history Use of alcohol , tobacco or illicit drugs  Current medications and supplements including opioid prescriptions. Functional ability and status Nutritional status Physical activity Advanced directives List of other physicians Hospitalizations, surgeries, and ER visits in previous 12 months Vitals Screenings to include cognitive, depression, and falls Referrals and appointments  Orders Placed This Encounter  Procedures   Hepatitis C antibody    Standing Status:   Future    Expiration Date:   04/08/2025   In addition, I have reviewed and discussed with patient certain preventive protocols, quality metrics, and best practice recommendations. A written personalized care plan for preventive services as well as general preventive health recommendations were provided to patient.   Verdie CHRISTELLA Saba, CMA   04/08/2024   Return in 1 year (on 04/08/2025).  After Visit Summary: (MyChart) Due to this being a telephonic visit, the after visit summary with patients personalized plan was offered to patient via MyChart   Nurse Notes: scheduled a CPE appt w/PCP for 04/2024. "

## 2024-04-08 NOTE — Patient Instructions (Addendum)
 [name],  Thank you for taking the time for your Medicare Wellness Visit. I appreciate your continued commitment to your health goals. Please review the care plan we discussed, and feel free to reach out if I can assist you further.  Please note that Annual Wellness Visits do not include a physical exam. Some assessments may be limited, especially if the visit was conducted virtually. If needed, we may recommend an in-person follow-up with your provider.  Ongoing Care Seeing your primary care provider every 3 to 6 months helps us  monitor your health and provide consistent, personalized care.   Referrals If a referral was made during today's visit and you haven't received any updates within two weeks, please contact the referred provider directly to check on the status.  Recommended Screenings:  Health Maintenance  Topic Date Due   Hepatitis C Screening  Never done   DTaP/Tdap/Td vaccine (2 - Tdap) 06/08/2020   Flu Shot  11/10/2023   COVID-19 Vaccine (4 - 2025-26 season) 12/11/2023   Medicare Annual Wellness Visit  04/08/2025   Breast Cancer Screening  09/10/2025   Colon Cancer Screening  02/09/2030   Pneumococcal Vaccine for age over 71  Completed   Osteoporosis screening with Bone Density Scan  Completed   Meningitis B Vaccine  Aged Out   Zoster (Shingles) Vaccine  Discontinued       03/09/2024   12:38 PM  Advanced Directives  Would patient like information on creating a medical advance directive? No - Patient declined    Vision: Annual vision screenings are recommended for early detection of glaucoma, cataracts, and diabetic retinopathy. These exams can also reveal signs of chronic conditions such as diabetes and high blood pressure.  Dental: Annual dental screenings help detect early signs of oral cancer, gum disease, and other conditions linked to overall health, including heart disease and diabetes.

## 2024-04-09 ENCOUNTER — Encounter: Payer: Self-pay | Admitting: Obstetrics and Gynecology

## 2024-04-09 ENCOUNTER — Ambulatory Visit: Admitting: Obstetrics and Gynecology

## 2024-04-09 ENCOUNTER — Other Ambulatory Visit (HOSPITAL_COMMUNITY)
Admission: RE | Admit: 2024-04-09 | Discharge: 2024-04-09 | Disposition: A | Discharge status: Home / Self Care | Source: Other Acute Inpatient Hospital | Attending: Obstetrics and Gynecology | Admitting: Obstetrics and Gynecology

## 2024-04-09 VITALS — BP 112/74 | HR 78

## 2024-04-09 DIAGNOSIS — R82998 Other abnormal findings in urine: Secondary | ICD-10-CM

## 2024-04-09 DIAGNOSIS — R350 Frequency of micturition: Secondary | ICD-10-CM

## 2024-04-09 DIAGNOSIS — Z48816 Encounter for surgical aftercare following surgery on the genitourinary system: Secondary | ICD-10-CM

## 2024-04-09 DIAGNOSIS — R319 Hematuria, unspecified: Secondary | ICD-10-CM | POA: Diagnosis not present

## 2024-04-09 LAB — POCT URINALYSIS DIP (CLINITEK)
Bilirubin, UA: NEGATIVE
Glucose, UA: NEGATIVE mg/dL
Ketones, POC UA: NEGATIVE mg/dL
Nitrite, UA: NEGATIVE
Spec Grav, UA: 1.02
Urobilinogen, UA: 0.2 U/dL
pH, UA: 7

## 2024-04-09 LAB — URINALYSIS, COMPLETE (UACMP) WITH MICROSCOPIC
Bilirubin Urine: NEGATIVE
Glucose, UA: NEGATIVE mg/dL
Hgb urine dipstick: NEGATIVE
Ketones, ur: NEGATIVE mg/dL
Nitrite: NEGATIVE
Protein, ur: 30 mg/dL — AB
Specific Gravity, Urine: 1.016 (ref 1.005–1.030)
WBC, UA: >50 WBC/hpf (ref 0–5)
pH: 7 (ref 5.0–8.0)

## 2024-04-09 NOTE — Progress Notes (Signed)
 [facility]  Date of Visit: 04/09/2024  History of Present Illness: Ms. Moynahan is a 73 y.o. female scheduled today for a post-operative visit.   Surgery: s/p  anterior repair, sacrospinous hysteropexy, midurethral sling, cystoscopy, bladder biopsy 02/12/24   Postoperative course complicated by retropubic hematoma, intermittent urinary retention, and ileus requiring hospitalization.   Has had some burning with urination over the last week. She stopped the trospium . Still has a little leakage with urgency but not gushing. Still does not have much of an urge to urinate but gets pressure. Feels like the stream is lazy.  At night she is waking up every 2-2.5 hours to urinate. During the day she can hold her urine longer periods.   Last visit she had a cystoscopy which was normal. She also underwent urodynamic testing.   Urodynamic Impression:  1. Sensation was normal; capacity was normal 2. Stress Incontinence was demonstrated at normal pressures; 3. Detrusor Overactivity was demonstrated without leakage. 4. Emptying was dysfunctional with a normal PVR (40ml), a sustained detrusor contraction present,  abdominal straining not present, dyssynergic urethral sphincter activity on EMG.  Medications: She has a current medication list which includes the following prescription(s): acetaminophen , alpha-lipoic acid, amoxicillin , vitamin c , aspirin , atenolol -chlorthalidone , b complex vitamins, boron, calcium  carbonate, d 5000, coq10, diclofenac  sodium, diphenoxylate -atropine , elderberry, estradiol , folic acid , glutathione, ketotifen , klor-con  m20, loperamide, magnesium , melatonin, fiber, methocarbamol , multiple vitamins-minerals, OVER THE COUNTER MEDICATION, OVER THE COUNTER MEDICATION, OVER THE COUNTER MEDICATION, saccharomyces boulardii, simvastatin , trospium  chloride, turmeric, and vitamin k.   Allergies: Patient is allergic to egg protein-containing drug products, etodolac, fish  protein-containing drug products, omeprazole, pineapple, and gluten meal.   Physical Exam: BP 112/74   Pulse 78   LMP 02/09/2005   Abdomen: soft, non-tender, without masses or organomegaly Suprapubic incisions- well healed Pelvic Examination: Vagina: Incisions healing well. Sutures are present at the apex and there is not granulation tissue. No tenderness along the anterior or posterior vagina. No apical tenderness. No pelvic masses. No visible or palpable mesh.    Results for orders placed or performed in visit on 04/09/24  POCT URINALYSIS DIP (CLINITEK)   Collection Time: 04/09/24  1:52 PM  Result Value Ref Range   Color, UA yellow yellow   Clarity, UA cloudy (A) clear   Glucose, UA negative negative mg/dL   Bilirubin, UA negative negative   Ketones, POC UA negative negative mg/dL   Spec Grav, UA 8.979 8.989 - 1.025   Blood, UA trace-lysed (A) negative   pH, UA 7.0 5.0 - 8.0   POC PROTEIN,UA trace negative, trace   Urobilinogen, UA 0.2 0.2 or 1.0 E.U./dL   Nitrite, UA Negative Negative   Leukocytes, UA Small (1+) (A) Negative   *Note: Due to a large number of results and/or encounters for the requested time period, some results have not been displayed. A complete set of results can be found in Results Review.    ---------------------------------------------------------  Assessment and Plan:  1. Urinary frequency   2. Leukocytes in urine   3. Hematuria, unspecified type    - Well healed post-operatively.  - Will send urine for culture.  - We discussed she can continue with any activity including intercourse if desired. Continue vaginal estrogen twice a week for atrophy.  - She is still very bothered by her urgency symptoms although they have improved. We discussed the options of pelvic PT and PTNS which is non-invasive and would not have the potential side effects that medication does. She is interested  in proceeding with PTNS.   [name], MD

## 2024-04-12 ENCOUNTER — Encounter: Payer: Self-pay | Admitting: Obstetrics and Gynecology

## 2024-04-12 ENCOUNTER — Ambulatory Visit: Payer: Self-pay | Admitting: Obstetrics and Gynecology

## 2024-04-12 ENCOUNTER — Other Ambulatory Visit: Payer: Self-pay | Admitting: Obstetrics and Gynecology

## 2024-04-12 DIAGNOSIS — N3281 Overactive bladder: Secondary | ICD-10-CM

## 2024-04-12 DIAGNOSIS — N390 Urinary tract infection, site not specified: Secondary | ICD-10-CM

## 2024-04-12 LAB — URINE CULTURE: Culture: >=100000 — AB

## 2024-04-12 MED ORDER — NITROFURANTOIN MONOHYD MACRO 100 MG PO CAPS: 100.0000 mg | ORAL_CAPSULE | Freq: Two times a day (BID) | ORAL | 0 refills | Status: AC

## 2024-04-17 ENCOUNTER — Encounter: Payer: Self-pay | Admitting: Oncology

## 2024-04-22 ENCOUNTER — Ambulatory Visit

## 2024-04-22 ENCOUNTER — Encounter: Payer: Self-pay | Admitting: *Deleted

## 2024-04-22 ENCOUNTER — Other Ambulatory Visit (HOSPITAL_COMMUNITY)
Admission: RE | Admit: 2024-04-22 | Discharge: 2024-04-22 | Disposition: A | Discharge status: Home / Self Care | Source: Other Acute Inpatient Hospital | Attending: Obstetrics and Gynecology | Admitting: Obstetrics and Gynecology

## 2024-04-22 VITALS — BP 117/73 | HR 73 | Temp 97.9°F

## 2024-04-22 DIAGNOSIS — R82998 Other abnormal findings in urine: Secondary | ICD-10-CM | POA: Insufficient documentation

## 2024-04-22 NOTE — Patient Instructions (Signed)
 Your in office test was positive, we have sent the urine for additional testing. We will contact you within the next 3-4 days with these results.   Please call if you continue to experience persistent or worsening urinary symptoms such as fever > 100.4, nausea/vomiting, one sided back pain or blood in your urine.    It was a pleasure to see you today!  Thank you for trusting me with your care!

## 2024-04-22 NOTE — Progress Notes (Signed)
 [name] arrived today with dysuria. Patient is notexperiencing fever, unstable vitals and/or one-sided back flank pain. Patient has not had had a recent hospitalization due to UTI.  Last visit in the office was 04/12/2024.  Per protocol:   The most recent Urinalysis completed on 04-09-2024 and was not normal.  Last Creatinine level  Lab Results  Component Value Date   CREATININE 0.83 03/13/2024    An urine specimen was collected and POCT urinalysis completed.  [] A cath specimen was collected due to patient's current condition, symptoms or post-procedural state.  Total urine output by catheter is  Output by Drain (mL) 04/20/24 0701 - 04/20/24 1900 04/20/24 1901 - 04/21/24 0700 04/21/24 0701 - 04/21/24 1900 04/21/24 1901 - 04/22/24 0700 04/22/24 0701 - 04/22/24 1455  Patient has no LDAs of requested type attached.    [name]    POCT Urine results is not normal.  Urine micro was not sent per protocol for abnormal urinalysis.  Urine culture was sent per protocol for abnormal urinalysis.     [x] Pt was notified of positive urine results and plan for additional urine testing. We will contact you within the next 3-4 days with these results.  [] No Prescription was sent to your pharmacy.  The additional testing will indicate if a prescription is needed.   [] Patient was notified of abnormal urine results. The following prescription is sent to your preferred pharmacy.  []  Macrobid  100mg  #10 1 tablet by mouth twice daily with food for 5 days      []  Bactrim  DS 800-160mg  #6 1 tablet by mouth twice daily for 3 days        []  Due to your current medication allergies, an alternate prescription was discussed with your provider and will be prescribed and sent to your pharmacy.  [] You can take over the counter AZO two tablets up to three times a day for two days.  Take AZO tablets with a full glass of water . AZO will turn your urine orange, this is normal.   [] The patient was notified of negative urine results.   If symptoms persist, you may take over the counter AZO two tablets up to three times a day for two days.  AZO will turn your urine orange, this is normal.  Contact the office back to schedule an appointment if your symptoms persist or worsen or you develop additional symptoms.       CC'd note to patient's provider.

## 2024-04-23 ENCOUNTER — Ambulatory Visit: Payer: Self-pay | Admitting: Obstetrics

## 2024-04-23 LAB — URINE CULTURE

## 2024-04-24 ENCOUNTER — Ambulatory Visit (INDEPENDENT_AMBULATORY_CARE_PROVIDER_SITE_OTHER): Admitting: Internal Medicine

## 2024-04-24 VITALS — BP 110/70 | HR 66 | Temp 97.5°F | Ht 62.0 in | Wt 158.0 lb

## 2024-04-24 DIAGNOSIS — K529 Noninfective gastroenteritis and colitis, unspecified: Secondary | ICD-10-CM

## 2024-04-24 DIAGNOSIS — K5010 Crohn's disease of large intestine without complications: Secondary | ICD-10-CM

## 2024-04-24 DIAGNOSIS — E876 Hypokalemia: Secondary | ICD-10-CM

## 2024-04-24 DIAGNOSIS — Z0000 Encounter for general adult medical examination without abnormal findings: Secondary | ICD-10-CM | POA: Diagnosis not present

## 2024-04-24 DIAGNOSIS — K5904 Chronic idiopathic constipation: Secondary | ICD-10-CM

## 2024-04-24 DIAGNOSIS — E611 Iron deficiency: Secondary | ICD-10-CM | POA: Diagnosis not present

## 2024-04-24 DIAGNOSIS — E782 Mixed hyperlipidemia: Secondary | ICD-10-CM

## 2024-04-24 DIAGNOSIS — N3281 Overactive bladder: Secondary | ICD-10-CM | POA: Diagnosis not present

## 2024-04-24 DIAGNOSIS — I10 Essential (primary) hypertension: Secondary | ICD-10-CM

## 2024-04-24 MED ORDER — ATENOLOL 50 MG PO TABS: 50.0000 mg | ORAL_TABLET | Freq: Every day | ORAL | 3 refills | Status: AC

## 2024-04-24 NOTE — Progress Notes (Signed)
 "  Subjective:   Patient ID: [name], female    DOB: 1950/10/20, 74 y.o.   MRN: [id number]  The patient is here for physical. Pertinent topics discussed: Discussed the use of AI scribe software for clinical note transcription with the patient, who gave verbal consent to proceed.  History of Present Illness [name] is a 74 year old female who presents with ongoing urinary incontinence and complications post-surgery.  She underwent bladder surgery for prolapse in November and required a catheter for six weeks post-operatively. Since the surgery, she has been hospitalized twice, each time for four to five days. A hematoma the size of an orange was discovered after her surgery; she reported discomfort and urinary issues. Despite the surgery, her incontinence has worsened, and she has experienced several urinary tract infections, although recent tests came back negative for infection.  She has a history of microscopic colitis, which previously caused frequent bowel movements. Currently, she experiences constipation and uses Miralax  to manage it. She received a notification from MyChart to make an appointment, which mentioned 'something of the colon,' but she is unsure of the specifics. A recent flare-up of her colitis resulted in vomiting and dietary restrictions for a couple of days.  Her sense of taste has deteriorated significantly, affecting her ability to eat, with sweets being the only food that tastes normal. This change in taste occurred suddenly and is impacting her nutrition.  She is currently taking atenolol  and chlorthalidone  for blood pressure management, which includes a diuretic component. She takes potassium supplements twice daily due to low levels noted during hospital stays. She experiences occasional irregular heartbeats, particularly noted during her surgery. No new chest pain, breathing difficulties, or significant swelling in her feet or ankles, although she notes  chronic slight swelling in one ankle.  She has a history of carpal tunnel surgery but is experiencing numbness in her thumb and finger, affecting her fine motor skills.  PMH, [facility], social history reviewed and updated  Review of Systems  Constitutional: Negative.   HENT: Negative.    Eyes: Negative.   Respiratory:  Negative for cough, chest tightness and shortness of breath.   Cardiovascular:  Negative for chest pain, palpitations and leg swelling.  Gastrointestinal:  Positive for blood in stool and constipation. Negative for abdominal distention, abdominal pain, diarrhea, nausea and vomiting.  Genitourinary:  Positive for frequency and urgency.  Musculoskeletal:  Positive for arthralgias.  Skin: Negative.   Neurological: Negative.   Psychiatric/Behavioral: Negative.      Objective:  Physical Exam Constitutional:      Appearance: She is well-developed.  HENT:     Head: Normocephalic and atraumatic.  Cardiovascular:     Rate and Rhythm: Normal rate and regular rhythm.  Pulmonary:     Effort: Pulmonary effort is normal. No respiratory distress.     Breath sounds: Normal breath sounds. No wheezing or rales.  Abdominal:     General: Bowel sounds are normal. There is no distension.     Palpations: Abdomen is soft.     Tenderness: There is no abdominal tenderness.  Musculoskeletal:     Cervical back: Normal range of motion.  Skin:    General: Skin is warm and dry.  Neurological:     Mental Status: She is alert and oriented to person, place, and time.     Coordination: Coordination normal.     Vitals:   04/24/24 1001  BP: 110/70  Pulse: 66  Temp: (!) 97.5 F (36.4 C)  TempSrc: Oral  SpO2: 97%  Weight: 158 lb (71.7 kg)  Height: 5' 2 (1.575 m)    Assessment & Plan:   "

## 2024-04-24 NOTE — Patient Instructions (Signed)
 We will stop the atenolol /chlorthalidone . We will start just atenolol  by itself.   When you stop the atenolol /chlorthalidone  also stop taking the potassium pills.   Come back for labs about 2-4 weeks after the change in medicines.  Watch the blood pressure with the change and the goal is <140/90. Let us  know if it is going up.

## 2024-04-26 ENCOUNTER — Encounter: Payer: Self-pay | Admitting: Internal Medicine

## 2024-04-26 NOTE — Assessment & Plan Note (Signed)
 Due to moderate issues with incontinence we will attempt to stop her diuretic. Stop atenolol /chlorthalidone . Start atenolol  50 mg daily alone. Monitor BP closely for next 1-2 weeks and let us  know readings. Goal BP <140/90. If elevated will plan to start amlodipine to help BP next.

## 2024-04-26 NOTE — Assessment & Plan Note (Signed)
 She is having some intermittent rectal bleeding with constipation lately. She does follow with GI and is up to date on colonoscopy.

## 2024-04-26 NOTE — Assessment & Plan Note (Signed)
 Checking CBC and adjsut as needed.

## 2024-04-26 NOTE — Assessment & Plan Note (Addendum)
 Flu shot declines. Pneumonia up to date. Shingrix declines. Tetanus declines. Colonoscopy up to date. Mammogram up to date, pap smear aged out and dexa complete. Counseled about sun safety and mole surveillance. Counseled about the dangers of distracted driving. Given 10 year screening recommendations.

## 2024-04-26 NOTE — Assessment & Plan Note (Signed)
 With moderate incontinence issues s/p procedure and hematoma. We are stopping chlorthalidone  to see if this helps.

## 2024-04-26 NOTE — Assessment & Plan Note (Signed)
 Checking lipid panel in a few weeks. Adjust as needed.

## 2024-04-26 NOTE — Assessment & Plan Note (Signed)
 We will stop potassium supplementation when we stop chlorthalidone  and recheck labs about 2-4 weeks after stopping both K and chlorthalidone .

## 2024-04-26 NOTE — Assessment & Plan Note (Signed)
 Struggling with this since surgery and is using otc to help manage this.

## 2024-04-26 NOTE — Assessment & Plan Note (Signed)
 Not having this since surgery and is struggling with constipation.

## 2024-04-29 ENCOUNTER — Encounter: Payer: Self-pay | Admitting: Adult Health

## 2024-05-15 ENCOUNTER — Encounter: Payer: Self-pay | Admitting: Obstetrics and Gynecology

## 2024-05-24 ENCOUNTER — Ambulatory Visit: Admitting: Internal Medicine

## 2024-07-12 ENCOUNTER — Other Ambulatory Visit

## 2024-07-12 ENCOUNTER — Ambulatory Visit: Admitting: Oncology

## 2025-02-24 ENCOUNTER — Ambulatory Visit: Admitting: Adult Health

## 2025-04-14 ENCOUNTER — Ambulatory Visit
